# Patient Record
Sex: Female | Born: 1950
Health system: Southern US, Community
[De-identification: ages and names within clinical notes are randomized; demographics above are authoritative.]

## PROBLEM LIST (undated history)

## (undated) DIAGNOSIS — Z720 Tobacco use: Secondary | ICD-10-CM

## (undated) DIAGNOSIS — G8929 Other chronic pain: Secondary | ICD-10-CM

## (undated) DIAGNOSIS — R51 Headache: Secondary | ICD-10-CM

## (undated) DIAGNOSIS — E7800 Pure hypercholesterolemia, unspecified: Secondary | ICD-10-CM

## (undated) DIAGNOSIS — M1990 Unspecified osteoarthritis, unspecified site: Secondary | ICD-10-CM

## (undated) DIAGNOSIS — I10 Essential (primary) hypertension: Secondary | ICD-10-CM

## (undated) DIAGNOSIS — R0601 Orthopnea: Secondary | ICD-10-CM

## (undated) DIAGNOSIS — I2510 Atherosclerotic heart disease of native coronary artery without angina pectoris: Secondary | ICD-10-CM

## (undated) DIAGNOSIS — D649 Anemia, unspecified: Secondary | ICD-10-CM

## (undated) DIAGNOSIS — E119 Type 2 diabetes mellitus without complications: Secondary | ICD-10-CM

## (undated) DIAGNOSIS — I447 Left bundle-branch block, unspecified: Secondary | ICD-10-CM

## (undated) DIAGNOSIS — Z91199 Patient's noncompliance with other medical treatment and regimen due to unspecified reason: Secondary | ICD-10-CM

## (undated) DIAGNOSIS — J189 Pneumonia, unspecified organism: Secondary | ICD-10-CM

## (undated) DIAGNOSIS — J42 Unspecified chronic bronchitis: Secondary | ICD-10-CM

## (undated) DIAGNOSIS — M545 Low back pain, unspecified: Secondary | ICD-10-CM

## (undated) DIAGNOSIS — I5042 Chronic combined systolic (congestive) and diastolic (congestive) heart failure: Secondary | ICD-10-CM

## (undated) DIAGNOSIS — Z9119 Patient's noncompliance with other medical treatment and regimen: Secondary | ICD-10-CM

## (undated) HISTORY — PX: JOINT REPLACEMENT: SHX530

---

## 1998-07-13 ENCOUNTER — Emergency Department (HOSPITAL_COMMUNITY): Admission: EM | Admit: 1998-07-13 | Discharge: 1998-07-13 | Payer: Self-pay | Admitting: Emergency Medicine

## 1999-02-27 ENCOUNTER — Emergency Department (HOSPITAL_COMMUNITY): Admission: EM | Admit: 1999-02-27 | Discharge: 1999-02-27 | Payer: Self-pay | Admitting: Emergency Medicine

## 1999-02-28 ENCOUNTER — Emergency Department (HOSPITAL_COMMUNITY): Admission: EM | Admit: 1999-02-28 | Discharge: 1999-02-28 | Payer: Self-pay | Admitting: Emergency Medicine

## 1999-02-28 ENCOUNTER — Encounter: Payer: Self-pay | Admitting: Emergency Medicine

## 1999-05-16 ENCOUNTER — Emergency Department (HOSPITAL_COMMUNITY): Admission: EM | Admit: 1999-05-16 | Discharge: 1999-05-16 | Payer: Self-pay | Admitting: *Deleted

## 1999-05-16 ENCOUNTER — Encounter: Payer: Self-pay | Admitting: *Deleted

## 1999-06-08 ENCOUNTER — Encounter: Payer: Self-pay | Admitting: Emergency Medicine

## 1999-06-08 ENCOUNTER — Emergency Department (HOSPITAL_COMMUNITY): Admission: EM | Admit: 1999-06-08 | Discharge: 1999-06-08 | Payer: Self-pay | Admitting: Emergency Medicine

## 1999-08-05 ENCOUNTER — Encounter: Payer: Self-pay | Admitting: Emergency Medicine

## 1999-08-05 ENCOUNTER — Emergency Department (HOSPITAL_COMMUNITY): Admission: EM | Admit: 1999-08-05 | Discharge: 1999-08-05 | Payer: Self-pay | Admitting: Emergency Medicine

## 1999-10-23 ENCOUNTER — Emergency Department (HOSPITAL_COMMUNITY): Admission: EM | Admit: 1999-10-23 | Discharge: 1999-10-23 | Payer: Self-pay | Admitting: Emergency Medicine

## 2000-01-08 ENCOUNTER — Encounter: Admission: RE | Admit: 2000-01-08 | Discharge: 2000-01-08 | Payer: Self-pay | Admitting: Internal Medicine

## 2000-01-09 ENCOUNTER — Encounter: Payer: Self-pay | Admitting: *Deleted

## 2000-01-09 ENCOUNTER — Ambulatory Visit (HOSPITAL_COMMUNITY): Admission: RE | Admit: 2000-01-09 | Discharge: 2000-01-09 | Payer: Self-pay | Admitting: *Deleted

## 2000-02-18 ENCOUNTER — Encounter: Admission: RE | Admit: 2000-02-18 | Discharge: 2000-02-18 | Payer: Self-pay | Admitting: Obstetrics & Gynecology

## 2000-02-19 ENCOUNTER — Encounter: Admission: RE | Admit: 2000-02-19 | Discharge: 2000-02-19 | Payer: Self-pay

## 2000-03-10 ENCOUNTER — Ambulatory Visit (HOSPITAL_COMMUNITY): Admission: RE | Admit: 2000-03-10 | Discharge: 2000-03-10 | Payer: Self-pay

## 2001-01-10 ENCOUNTER — Encounter: Payer: Self-pay | Admitting: Emergency Medicine

## 2001-01-10 ENCOUNTER — Emergency Department (HOSPITAL_COMMUNITY): Admission: EM | Admit: 2001-01-10 | Discharge: 2001-01-10 | Payer: Self-pay | Admitting: Emergency Medicine

## 2002-01-04 ENCOUNTER — Emergency Department (HOSPITAL_COMMUNITY): Admission: EM | Admit: 2002-01-04 | Discharge: 2002-01-04 | Payer: Self-pay | Admitting: Emergency Medicine

## 2002-01-04 ENCOUNTER — Encounter: Payer: Self-pay | Admitting: Emergency Medicine

## 2002-01-07 ENCOUNTER — Inpatient Hospital Stay (HOSPITAL_COMMUNITY): Admission: AD | Admit: 2002-01-07 | Discharge: 2002-01-07 | Payer: Self-pay | Admitting: *Deleted

## 2002-03-30 ENCOUNTER — Emergency Department (HOSPITAL_COMMUNITY): Admission: EM | Admit: 2002-03-30 | Discharge: 2002-03-30 | Payer: Self-pay | Admitting: Emergency Medicine

## 2002-03-30 ENCOUNTER — Encounter: Payer: Self-pay | Admitting: Emergency Medicine

## 2002-05-22 ENCOUNTER — Encounter: Payer: Self-pay | Admitting: Emergency Medicine

## 2002-05-22 ENCOUNTER — Emergency Department (HOSPITAL_COMMUNITY): Admission: EM | Admit: 2002-05-22 | Discharge: 2002-05-22 | Payer: Self-pay | Admitting: Emergency Medicine

## 2003-03-30 ENCOUNTER — Inpatient Hospital Stay (HOSPITAL_COMMUNITY): Admission: AD | Admit: 2003-03-30 | Discharge: 2003-03-30 | Payer: Self-pay | Admitting: Obstetrics & Gynecology

## 2003-04-25 ENCOUNTER — Encounter: Admission: RE | Admit: 2003-04-25 | Discharge: 2003-04-25 | Payer: Self-pay | Admitting: Obstetrics and Gynecology

## 2003-06-13 ENCOUNTER — Emergency Department (HOSPITAL_COMMUNITY): Admission: EM | Admit: 2003-06-13 | Discharge: 2003-06-13 | Payer: Self-pay | Admitting: Emergency Medicine

## 2003-06-14 ENCOUNTER — Emergency Department (HOSPITAL_COMMUNITY): Admission: EM | Admit: 2003-06-14 | Discharge: 2003-06-14 | Payer: Self-pay | Admitting: Emergency Medicine

## 2003-06-28 ENCOUNTER — Encounter: Admission: RE | Admit: 2003-06-28 | Discharge: 2003-06-28 | Payer: Self-pay | Admitting: Internal Medicine

## 2003-07-10 ENCOUNTER — Ambulatory Visit (HOSPITAL_COMMUNITY): Admission: RE | Admit: 2003-07-10 | Discharge: 2003-07-10 | Payer: Self-pay | Admitting: Internal Medicine

## 2003-07-17 ENCOUNTER — Encounter: Admission: RE | Admit: 2003-07-17 | Discharge: 2003-07-17 | Payer: Self-pay | Admitting: Internal Medicine

## 2004-01-19 ENCOUNTER — Emergency Department (HOSPITAL_COMMUNITY): Admission: EM | Admit: 2004-01-19 | Discharge: 2004-01-19 | Payer: Self-pay | Admitting: Emergency Medicine

## 2004-02-29 ENCOUNTER — Ambulatory Visit: Payer: Self-pay | Admitting: Internal Medicine

## 2004-03-01 ENCOUNTER — Ambulatory Visit: Payer: Self-pay | Admitting: *Deleted

## 2004-03-06 ENCOUNTER — Emergency Department (HOSPITAL_COMMUNITY): Admission: EM | Admit: 2004-03-06 | Discharge: 2004-03-06 | Payer: Self-pay | Admitting: Emergency Medicine

## 2004-05-18 ENCOUNTER — Emergency Department (HOSPITAL_COMMUNITY): Admission: EM | Admit: 2004-05-18 | Discharge: 2004-05-18 | Payer: Self-pay | Admitting: Emergency Medicine

## 2004-08-17 ENCOUNTER — Emergency Department (HOSPITAL_COMMUNITY): Admission: EM | Admit: 2004-08-17 | Discharge: 2004-08-17 | Payer: Self-pay | Admitting: Emergency Medicine

## 2004-08-18 ENCOUNTER — Emergency Department (HOSPITAL_COMMUNITY): Admission: EM | Admit: 2004-08-18 | Discharge: 2004-08-18 | Payer: Self-pay | Admitting: *Deleted

## 2004-10-16 ENCOUNTER — Ambulatory Visit: Payer: Self-pay | Admitting: Internal Medicine

## 2004-11-15 ENCOUNTER — Ambulatory Visit: Payer: Self-pay | Admitting: Internal Medicine

## 2004-11-25 ENCOUNTER — Ambulatory Visit (HOSPITAL_COMMUNITY): Admission: RE | Admit: 2004-11-25 | Discharge: 2004-11-25 | Payer: Self-pay | Admitting: Internal Medicine

## 2004-12-10 ENCOUNTER — Ambulatory Visit: Payer: Self-pay | Admitting: Internal Medicine

## 2004-12-12 ENCOUNTER — Emergency Department (HOSPITAL_COMMUNITY): Admission: EM | Admit: 2004-12-12 | Discharge: 2004-12-12 | Payer: Self-pay | Admitting: Emergency Medicine

## 2004-12-14 ENCOUNTER — Ambulatory Visit (HOSPITAL_COMMUNITY): Admission: RE | Admit: 2004-12-14 | Discharge: 2004-12-14 | Payer: Self-pay | Admitting: Internal Medicine

## 2004-12-30 ENCOUNTER — Ambulatory Visit: Payer: Self-pay | Admitting: Internal Medicine

## 2005-01-23 ENCOUNTER — Encounter: Payer: Self-pay | Admitting: Cardiovascular Disease

## 2005-01-29 ENCOUNTER — Ambulatory Visit: Payer: Self-pay | Admitting: Cardiology

## 2005-01-29 ENCOUNTER — Inpatient Hospital Stay (HOSPITAL_COMMUNITY): Admission: RE | Admit: 2005-01-29 | Discharge: 2005-02-03 | Payer: Self-pay | Admitting: Orthopedic Surgery

## 2005-01-30 ENCOUNTER — Encounter: Payer: Self-pay | Admitting: Cardiology

## 2005-05-23 ENCOUNTER — Encounter: Payer: Self-pay | Admitting: Cardiovascular Disease

## 2005-08-15 ENCOUNTER — Ambulatory Visit: Payer: Self-pay | Admitting: Internal Medicine

## 2005-08-18 ENCOUNTER — Inpatient Hospital Stay (HOSPITAL_COMMUNITY): Admission: RE | Admit: 2005-08-18 | Discharge: 2005-08-21 | Payer: Self-pay | Admitting: Orthopedic Surgery

## 2005-11-24 ENCOUNTER — Ambulatory Visit: Payer: Self-pay | Admitting: Internal Medicine

## 2006-02-10 ENCOUNTER — Ambulatory Visit: Payer: Self-pay | Admitting: Internal Medicine

## 2006-06-05 ENCOUNTER — Ambulatory Visit: Payer: Self-pay | Admitting: Family Medicine

## 2006-09-18 ENCOUNTER — Ambulatory Visit: Payer: Self-pay | Admitting: Family Medicine

## 2006-10-11 ENCOUNTER — Emergency Department (HOSPITAL_COMMUNITY): Admission: EM | Admit: 2006-10-11 | Discharge: 2006-10-11 | Payer: Self-pay | Admitting: Emergency Medicine

## 2006-12-19 ENCOUNTER — Encounter: Admission: RE | Admit: 2006-12-19 | Discharge: 2006-12-19 | Payer: Self-pay | Admitting: Orthopedic Surgery

## 2006-12-19 ENCOUNTER — Encounter: Payer: Self-pay | Admitting: Cardiovascular Disease

## 2007-01-07 ENCOUNTER — Encounter: Admission: RE | Admit: 2007-01-07 | Discharge: 2007-02-16 | Payer: Self-pay | Admitting: Orthopedic Surgery

## 2007-03-25 ENCOUNTER — Emergency Department (HOSPITAL_COMMUNITY): Admission: EM | Admit: 2007-03-25 | Discharge: 2007-03-25 | Payer: Self-pay | Admitting: Emergency Medicine

## 2007-04-12 ENCOUNTER — Inpatient Hospital Stay (HOSPITAL_COMMUNITY): Admission: EM | Admit: 2007-04-12 | Discharge: 2007-04-20 | Payer: Self-pay | Admitting: Emergency Medicine

## 2007-04-12 ENCOUNTER — Ambulatory Visit: Payer: Self-pay | Admitting: Cardiology

## 2007-04-13 ENCOUNTER — Encounter (INDEPENDENT_AMBULATORY_CARE_PROVIDER_SITE_OTHER): Payer: Self-pay | Admitting: *Deleted

## 2007-05-24 ENCOUNTER — Emergency Department (HOSPITAL_COMMUNITY): Admission: EM | Admit: 2007-05-24 | Discharge: 2007-05-24 | Payer: Self-pay | Admitting: Emergency Medicine

## 2007-06-23 ENCOUNTER — Ambulatory Visit (HOSPITAL_BASED_OUTPATIENT_CLINIC_OR_DEPARTMENT_OTHER): Admission: RE | Admit: 2007-06-23 | Discharge: 2007-06-23 | Payer: Self-pay | Admitting: Internal Medicine

## 2007-06-26 ENCOUNTER — Ambulatory Visit: Payer: Self-pay | Admitting: Internal Medicine

## 2007-08-26 ENCOUNTER — Encounter: Payer: Self-pay | Admitting: Cardiovascular Disease

## 2007-09-13 ENCOUNTER — Inpatient Hospital Stay (HOSPITAL_COMMUNITY): Admission: RE | Admit: 2007-09-13 | Discharge: 2007-09-16 | Payer: Self-pay | Admitting: Orthopedic Surgery

## 2007-11-19 ENCOUNTER — Encounter: Payer: Self-pay | Admitting: Cardiovascular Disease

## 2007-12-13 ENCOUNTER — Emergency Department (HOSPITAL_COMMUNITY): Admission: EM | Admit: 2007-12-13 | Discharge: 2007-12-13 | Payer: Self-pay | Admitting: Emergency Medicine

## 2007-12-21 ENCOUNTER — Encounter: Payer: Self-pay | Admitting: Cardiovascular Disease

## 2008-01-03 ENCOUNTER — Emergency Department (HOSPITAL_COMMUNITY): Admission: EM | Admit: 2008-01-03 | Discharge: 2008-01-03 | Payer: Self-pay | Admitting: Emergency Medicine

## 2008-04-08 ENCOUNTER — Emergency Department (HOSPITAL_COMMUNITY): Admission: EM | Admit: 2008-04-08 | Discharge: 2008-04-09 | Payer: Self-pay | Admitting: Emergency Medicine

## 2009-02-01 DIAGNOSIS — R079 Chest pain, unspecified: Secondary | ICD-10-CM | POA: Insufficient documentation

## 2009-02-01 DIAGNOSIS — G4730 Sleep apnea, unspecified: Secondary | ICD-10-CM | POA: Insufficient documentation

## 2009-02-01 DIAGNOSIS — J209 Acute bronchitis, unspecified: Secondary | ICD-10-CM | POA: Insufficient documentation

## 2009-02-02 ENCOUNTER — Telehealth (INDEPENDENT_AMBULATORY_CARE_PROVIDER_SITE_OTHER): Payer: Self-pay | Admitting: *Deleted

## 2009-03-15 ENCOUNTER — Encounter: Admission: RE | Admit: 2009-03-15 | Discharge: 2009-03-15 | Payer: Self-pay | Admitting: Internal Medicine

## 2009-04-26 ENCOUNTER — Encounter (INDEPENDENT_AMBULATORY_CARE_PROVIDER_SITE_OTHER): Payer: Self-pay | Admitting: *Deleted

## 2009-07-22 ENCOUNTER — Emergency Department (HOSPITAL_COMMUNITY): Admission: EM | Admit: 2009-07-22 | Discharge: 2009-07-22 | Payer: Self-pay | Admitting: Emergency Medicine

## 2009-08-04 ENCOUNTER — Ambulatory Visit (HOSPITAL_COMMUNITY): Admission: RE | Admit: 2009-08-04 | Discharge: 2009-08-04 | Payer: Self-pay | Admitting: Orthopedic Surgery

## 2009-12-06 ENCOUNTER — Emergency Department (HOSPITAL_COMMUNITY): Admission: EM | Admit: 2009-12-06 | Discharge: 2009-12-06 | Payer: Self-pay | Admitting: Emergency Medicine

## 2010-01-16 ENCOUNTER — Observation Stay (HOSPITAL_COMMUNITY)
Admission: EM | Admit: 2010-01-16 | Discharge: 2010-01-17 | Payer: Self-pay | Source: Home / Self Care | Attending: Internal Medicine | Admitting: Internal Medicine

## 2010-02-16 ENCOUNTER — Encounter: Payer: Self-pay | Admitting: Internal Medicine

## 2010-02-17 ENCOUNTER — Encounter: Payer: Self-pay | Admitting: Orthopedic Surgery

## 2010-02-17 ENCOUNTER — Encounter: Payer: Self-pay | Admitting: Internal Medicine

## 2010-02-17 ENCOUNTER — Encounter: Payer: Self-pay | Admitting: Obstetrics and Gynecology

## 2010-02-21 ENCOUNTER — Other Ambulatory Visit (HOSPITAL_COMMUNITY): Payer: Self-pay | Admitting: Internal Medicine

## 2010-02-21 DIAGNOSIS — Z139 Encounter for screening, unspecified: Secondary | ICD-10-CM

## 2010-02-21 DIAGNOSIS — Z1231 Encounter for screening mammogram for malignant neoplasm of breast: Secondary | ICD-10-CM

## 2010-02-28 NOTE — Consult Note (Signed)
Summary: [facility]  [facility]   Imported By: Marylou Mccoy 02/23/2009 15:20:26  _____________________________________________________________________  External Attachment:    Type:   Image     Comment:   External Document

## 2010-02-28 NOTE — Consult Note (Signed)
Summary: [facility]   [facility]   Imported By: Marylou Mccoy 02/23/2009 15:35:34  _____________________________________________________________________  External Attachment:    Type:   Image     Comment:   External Document

## 2010-02-28 NOTE — Letter (Signed)
Summary: Appointment - Missed  [facility], Main Office  [address]   [place], [place] [address]   Phone: [phone]  Fax: [phone]     April 26, 2009 MRN: [id number]   [facility] [address] [place], [place]  [address]   Dear Ms. Kush,  Our records indicate you missed your appointment on        04/23/09                with Dr.       .       Riley Kill                             It is very important that we reach you to reschedule this appointment. We look forward to participating in your health care needs. Please contact us at the number listed above at your earliest convenience to reschedule this appointment.     Sincerely,    Glass blower/designer

## 2010-02-28 NOTE — Progress Notes (Signed)
  Faxed ROI, 12/14/,12/20,1/6,over to [facility].Recieved Records today, forwarded to Debra/Nishan for her to Flag. Cala Bradford Mesiemore  February 02, 2009 3:32 PM    Appended Document:  will not be sending to [facility] for her to flag, patient will be new here with Dr.Nishan on 02/26/09 will forward to [place].Marland KitchenMarland KitchenMarland Kitchen

## 2010-02-28 NOTE — Consult Note (Signed)
Summary: [facility]  [facility]   Imported By: Marylou Mccoy 02/23/2009 14:03:50  _____________________________________________________________________  External Attachment:    Type:   Image     Comment:   External Document

## 2010-02-28 NOTE — Consult Note (Signed)
Summary: [facility]  [facility]   Imported By: Marylou Mccoy 02/23/2009 15:39:10  _____________________________________________________________________  External Attachment:    Type:   Image     Comment:   External Document

## 2010-03-18 ENCOUNTER — Ambulatory Visit (HOSPITAL_COMMUNITY): Admission: RE | Admit: 2010-03-18 | Payer: Medicare Other | Source: Ambulatory Visit

## 2010-03-25 ENCOUNTER — Ambulatory Visit (HOSPITAL_COMMUNITY)
Admission: RE | Admit: 2010-03-25 | Discharge: 2010-03-25 | Disposition: A | Discharge status: Home / Self Care | Payer: PRIVATE HEALTH INSURANCE | Source: Ambulatory Visit | Attending: Internal Medicine | Admitting: Internal Medicine

## 2010-03-25 DIAGNOSIS — Z1231 Encounter for screening mammogram for malignant neoplasm of breast: Secondary | ICD-10-CM

## 2010-04-08 LAB — GLUCOSE, CAPILLARY
Glucose-Capillary: 231 mg/dL — ABNORMAL HIGH (ref 70–99)
Glucose-Capillary: 283 mg/dL — ABNORMAL HIGH (ref 70–99)
Glucose-Capillary: 291 mg/dL — ABNORMAL HIGH (ref 70–99)
Glucose-Capillary: 330 mg/dL — ABNORMAL HIGH (ref 70–99)
Glucose-Capillary: 331 mg/dL — ABNORMAL HIGH (ref 70–99)

## 2010-04-08 LAB — POCT CARDIAC MARKERS
CKMB, poc: 1 ng/mL (ref 1.0–8.0)
CKMB, poc: 1.1 ng/mL (ref 1.0–8.0)
CKMB, poc: <1 ng/mL — ABNORMAL LOW (ref 1.0–8.0)
Myoglobin, poc: 81.6 ng/mL (ref 12–200)
Myoglobin, poc: 86 ng/mL (ref 12–200)
Myoglobin, poc: 88.3 ng/mL (ref 12–200)
Troponin i, poc: <0.05 ng/mL (ref 0.00–0.09)
Troponin i, poc: <0.05 ng/mL (ref 0.00–0.09)
Troponin i, poc: <0.05 ng/mL (ref 0.00–0.09)

## 2010-04-08 LAB — COMPREHENSIVE METABOLIC PANEL
ALT: 10 U/L (ref 0–35)
AST: 15 U/L (ref 0–37)
Albumin: 3.3 g/dL — ABNORMAL LOW (ref 3.5–5.2)
Alkaline Phosphatase: 81 U/L (ref 39–117)
BUN: 12 mg/dL (ref 6–23)
CO2: 27 mEq/L (ref 19–32)
Calcium: 9 mg/dL (ref 8.4–10.5)
Chloride: 102 mEq/L (ref 96–112)
Creatinine, Ser: 0.73 mg/dL (ref 0.4–1.2)
GFR calc Af Amer: >60 mL/min (ref >60)
GFR calc non Af Amer: >60 mL/min (ref >60)
Glucose, Bld: 251 mg/dL — ABNORMAL HIGH (ref 70–99)
Potassium: 3.4 mEq/L — ABNORMAL LOW (ref 3.5–5.1)
Sodium: 139 mEq/L (ref 135–145)
Total Bilirubin: 0.6 mg/dL (ref 0.3–1.2)
Total Protein: 6.3 g/dL (ref 6.0–8.3)

## 2010-04-08 LAB — CBC
HCT: 36.2 % (ref 36.0–46.0)
HCT: 40.7 % (ref 36.0–46.0)
Hemoglobin: 12.8 g/dL (ref 12.0–15.0)
Hemoglobin: 14.7 g/dL (ref 12.0–15.0)
MCH: 31.4 pg (ref 26.0–34.0)
MCH: 31.8 pg (ref 26.0–34.0)
MCHC: 35.4 g/dL (ref 30.0–36.0)
MCHC: 36.1 g/dL — ABNORMAL HIGH (ref 30.0–36.0)
MCV: 88.1 fL (ref 78.0–100.0)
MCV: 88.9 fL (ref 78.0–100.0)
Platelets: 311 10*3/uL (ref 150–400)
Platelets: 433 10*3/uL — ABNORMAL HIGH (ref 150–400)
RBC: 4.07 MIL/uL (ref 3.87–5.11)
RBC: 4.62 MIL/uL (ref 3.87–5.11)
RDW: 12.2 % (ref 11.5–15.5)
RDW: 12.2 % (ref 11.5–15.5)
WBC: 10.6 10*3/uL — ABNORMAL HIGH (ref 4.0–10.5)
WBC: 9.8 10*3/uL (ref 4.0–10.5)

## 2010-04-08 LAB — LIPID PANEL
Cholesterol: 164 mg/dL (ref 0–200)
HDL: 35 mg/dL — ABNORMAL LOW (ref >39)
LDL Cholesterol: 104 mg/dL — ABNORMAL HIGH (ref 0–99)
Total CHOL/HDL Ratio: 4.7 RATIO
Triglycerides: 125 mg/dL (ref <150)
VLDL: 25 mg/dL (ref 0–40)

## 2010-04-08 LAB — DIFFERENTIAL
Basophils Absolute: 0 10*3/uL (ref 0.0–0.1)
Basophils Relative: 0 % (ref 0–1)
Eosinophils Absolute: 0.1 10*3/uL (ref 0.0–0.7)
Eosinophils Relative: 1 % (ref 0–5)
Lymphocytes Relative: 33 % (ref 12–46)
Lymphs Abs: 3.5 10*3/uL (ref 0.7–4.0)
Monocytes Absolute: 0.6 10*3/uL (ref 0.1–1.0)
Monocytes Relative: 6 % (ref 3–12)
Neutro Abs: 6.3 10*3/uL (ref 1.7–7.7)
Neutrophils Relative %: 60 % (ref 43–77)

## 2010-04-08 LAB — BASIC METABOLIC PANEL
BUN: 15 mg/dL (ref 6–23)
CO2: 27 mEq/L (ref 19–32)
Calcium: 9.7 mg/dL (ref 8.4–10.5)
Chloride: 103 mEq/L (ref 96–112)
Creatinine, Ser: 0.92 mg/dL (ref 0.4–1.2)
GFR calc Af Amer: >60 mL/min (ref >60)
GFR calc non Af Amer: >60 mL/min (ref >60)
Glucose, Bld: 318 mg/dL — ABNORMAL HIGH (ref 70–99)
Potassium: 3.7 mEq/L (ref 3.5–5.1)
Sodium: 137 mEq/L (ref 135–145)

## 2010-04-08 LAB — HEPATIC FUNCTION PANEL
ALT: 13 U/L (ref 0–35)
AST: 18 U/L (ref 0–37)
Albumin: 3.8 g/dL (ref 3.5–5.2)
Alkaline Phosphatase: 96 U/L (ref 39–117)
Bilirubin, Direct: <0.1 mg/dL (ref 0.0–0.3)
Total Bilirubin: 0.5 mg/dL (ref 0.3–1.2)
Total Protein: 7.3 g/dL (ref 6.0–8.3)

## 2010-04-08 LAB — MAGNESIUM: Magnesium: 1.8 mg/dL (ref 1.5–2.5)

## 2010-04-08 LAB — PROTIME-INR
INR: 0.93 (ref 0.00–1.49)
Prothrombin Time: 12.7 seconds (ref 11.6–15.2)

## 2010-04-08 LAB — CARDIAC PANEL(CRET KIN+CKTOT+MB+TROPI)
CK, MB: 1.5 ng/mL (ref 0.3–4.0)
Relative Index: INVALID (ref 0.0–2.5)
Total CK: 90 U/L (ref 7–177)
Troponin I: 0.02 ng/mL (ref 0.00–0.06)

## 2010-04-08 LAB — TSH: TSH: 1.332 u[IU]/mL (ref 0.350–4.500)

## 2010-04-08 LAB — HEMOGLOBIN A1C
Hgb A1c MFr Bld: 12.1 % — ABNORMAL HIGH (ref <5.7)
Mean Plasma Glucose: 301 mg/dL — ABNORMAL HIGH (ref <117)

## 2010-04-08 LAB — T4, FREE: Free T4: 1.21 ng/dL (ref 0.80–1.80)

## 2010-04-09 LAB — DIFFERENTIAL
Basophils Absolute: 0 10*3/uL (ref 0.0–0.1)
Basophils Relative: 0 % (ref 0–1)
Eosinophils Absolute: 0.2 10*3/uL (ref 0.0–0.7)
Eosinophils Relative: 2 % (ref 0–5)
Lymphocytes Relative: 32 % (ref 12–46)
Lymphs Abs: 3.4 10*3/uL (ref 0.7–4.0)
Monocytes Absolute: 0.5 10*3/uL (ref 0.1–1.0)
Monocytes Relative: 5 % (ref 3–12)
Neutro Abs: 6.4 10*3/uL (ref 1.7–7.7)
Neutrophils Relative %: 61 % (ref 43–77)

## 2010-04-09 LAB — BASIC METABOLIC PANEL
BUN: 10 mg/dL (ref 6–23)
CO2: 24 mEq/L (ref 19–32)
Calcium: 9.5 mg/dL (ref 8.4–10.5)
Chloride: 103 mEq/L (ref 96–112)
Creatinine, Ser: 0.64 mg/dL (ref 0.4–1.2)
GFR calc Af Amer: >60 mL/min (ref >60)
GFR calc non Af Amer: >60 mL/min (ref >60)
Glucose, Bld: 195 mg/dL — ABNORMAL HIGH (ref 70–99)
Potassium: 3.7 mEq/L (ref 3.5–5.1)
Sodium: 139 mEq/L (ref 135–145)

## 2010-04-09 LAB — CBC
HCT: 39.4 % (ref 36.0–46.0)
Hemoglobin: 13.9 g/dL (ref 12.0–15.0)
MCH: 32.4 pg (ref 26.0–34.0)
MCHC: 35.3 g/dL (ref 30.0–36.0)
MCV: 91.8 fL (ref 78.0–100.0)
Platelets: 429 10*3/uL — ABNORMAL HIGH (ref 150–400)
RBC: 4.29 MIL/uL (ref 3.87–5.11)
RDW: 12.8 % (ref 11.5–15.5)
WBC: 10.5 10*3/uL (ref 4.0–10.5)

## 2010-04-09 LAB — TROPONIN I: Troponin I: 0.02 ng/mL (ref 0.00–0.06)

## 2010-04-14 LAB — URINALYSIS, ROUTINE W REFLEX MICROSCOPIC
Bilirubin Urine: NEGATIVE
Glucose, UA: >1000 mg/dL — AB
Ketones, ur: NEGATIVE mg/dL
Leukocytes, UA: NEGATIVE
Nitrite: NEGATIVE
Protein, ur: NEGATIVE mg/dL
Specific Gravity, Urine: 1.034 — ABNORMAL HIGH (ref 1.005–1.030)
Urobilinogen, UA: 0.2 mg/dL (ref 0.0–1.0)
pH: 6 (ref 5.0–8.0)

## 2010-04-14 LAB — URINE CULTURE: Colony Count: >=100000

## 2010-04-14 LAB — URINE MICROSCOPIC-ADD ON

## 2010-04-14 LAB — GLUCOSE, CAPILLARY: Glucose-Capillary: 365 mg/dL — ABNORMAL HIGH (ref 70–99)

## 2010-05-09 LAB — URINALYSIS, ROUTINE W REFLEX MICROSCOPIC
Bilirubin Urine: NEGATIVE
Glucose, UA: >1000 mg/dL — AB
Ketones, ur: NEGATIVE mg/dL
Leukocytes, UA: NEGATIVE
Nitrite: NEGATIVE
Protein, ur: NEGATIVE mg/dL
Specific Gravity, Urine: 1.037 — ABNORMAL HIGH (ref 1.005–1.030)
Urobilinogen, UA: 0.2 mg/dL (ref 0.0–1.0)
pH: 5.5 (ref 5.0–8.0)

## 2010-05-09 LAB — COMPREHENSIVE METABOLIC PANEL
ALT: 17 U/L (ref 0–35)
AST: 18 U/L (ref 0–37)
Albumin: 3.8 g/dL (ref 3.5–5.2)
Alkaline Phosphatase: 118 U/L — ABNORMAL HIGH (ref 39–117)
BUN: 7 mg/dL (ref 6–23)
CO2: 27 mEq/L (ref 19–32)
Calcium: 9.5 mg/dL (ref 8.4–10.5)
Chloride: 95 mEq/L — ABNORMAL LOW (ref 96–112)
Creatinine, Ser: 0.72 mg/dL (ref 0.4–1.2)
GFR calc Af Amer: >60 mL/min (ref >60)
GFR calc non Af Amer: >60 mL/min (ref >60)
Glucose, Bld: 609 mg/dL (ref 70–99)
Potassium: 4 mEq/L (ref 3.5–5.1)
Sodium: 130 mEq/L — ABNORMAL LOW (ref 135–145)
Total Bilirubin: 0.6 mg/dL (ref 0.3–1.2)
Total Protein: 7.2 g/dL (ref 6.0–8.3)

## 2010-05-09 LAB — CK TOTAL AND CKMB (NOT AT ARMC)
CK, MB: 2.8 ng/mL (ref 0.3–4.0)
Relative Index: 1.5 (ref 0.0–2.5)
Total CK: 187 U/L — ABNORMAL HIGH (ref 7–177)

## 2010-05-09 LAB — GLUCOSE, CAPILLARY
Glucose-Capillary: 144 mg/dL — ABNORMAL HIGH (ref 70–99)
Glucose-Capillary: 251 mg/dL — ABNORMAL HIGH (ref 70–99)
Glucose-Capillary: 277 mg/dL — ABNORMAL HIGH (ref 70–99)
Glucose-Capillary: 393 mg/dL — ABNORMAL HIGH (ref 70–99)

## 2010-05-09 LAB — DIFFERENTIAL
Basophils Absolute: 0.1 10*3/uL (ref 0.0–0.1)
Basophils Relative: 1 % (ref 0–1)
Eosinophils Absolute: 0.1 10*3/uL (ref 0.0–0.7)
Eosinophils Relative: 1 % (ref 0–5)
Lymphocytes Relative: 27 % (ref 12–46)
Lymphs Abs: 2.5 10*3/uL (ref 0.7–4.0)
Monocytes Absolute: 0.5 10*3/uL (ref 0.1–1.0)
Monocytes Relative: 6 % (ref 3–12)
Neutro Abs: 5.9 10*3/uL (ref 1.7–7.7)
Neutrophils Relative %: 65 % (ref 43–77)

## 2010-05-09 LAB — CBC
HCT: 40.3 % (ref 36.0–46.0)
Hemoglobin: 13.8 g/dL (ref 12.0–15.0)
MCHC: 34.4 g/dL (ref 30.0–36.0)
MCV: 92.1 fL (ref 78.0–100.0)
Platelets: 408 10*3/uL — ABNORMAL HIGH (ref 150–400)
RBC: 4.37 MIL/uL (ref 3.87–5.11)
RDW: 12.5 % (ref 11.5–15.5)
WBC: 9.1 10*3/uL (ref 4.0–10.5)

## 2010-05-09 LAB — BRAIN NATRIURETIC PEPTIDE: Pro B Natriuretic peptide (BNP): <30 pg/mL (ref 0.0–100.0)

## 2010-05-09 LAB — URINE MICROSCOPIC-ADD ON

## 2010-05-09 LAB — TROPONIN I: Troponin I: 0.02 ng/mL (ref 0.00–0.06)

## 2010-05-09 LAB — D-DIMER, QUANTITATIVE: D-Dimer, Quant: 0.79 ug/mL-FEU — ABNORMAL HIGH (ref 0.00–0.48)

## 2010-06-11 NOTE — Cardiovascular Report (Signed)
[name], [name]                ACCOUNT NO.:  [id number]   MEDICAL RECORD NO.:  [id number]          PATIENT TYPE:  INP   LOCATION:  3709                         FACILITY:  [facility]   PHYSICIAN:  Bevelyn Buckles. Bensimhon, MDDATE OF BIRTH:  [birth year]   DATE OF PROCEDURE:  04/19/2007  DATE OF DISCHARGE:                            CARDIAC CATHETERIZATION   IDENTIFICATION:  [name] is a 60 year old woman with a history of  severe hypertension, poorly controlled diabetes and ongoing tobacco use.  She was admitted with chest pain.  Myoview suggested an EF of 39% with  question of inferior ischemia.  She is thus referred for cardiac  catheterization.  There is also a history of possible obesity  hypoventilation syndrome and a right heart catheterization was performed  as well.   PROCEDURES PERFORMED:  1. Right heart cath.  2. Selective coronary angiography.  3. Left heart cath.  4. Left ventriculogram.  5. Abdominal aortogram to evaluate for renal artery stenosis in the      setting of severe hypertension.  6. StarClose femoral artery closure device.   DESCRIPTION OF PROCEDURE:  The risks and indication of the procedure  were explained.  Consent was signed and placed in the chart.  A 5-French  arterial sheath was placed in the right femoral artery.  Standard  catheters including JL-4, JR-4 and angled pigtail used to for  catheterization.  All catheter exchanges made over wire.  There were no  apparent complications.  A 7-French venous sheath was placed in right  femoral vein.  Standard right heart catheterization was performed using  a Swan-Ganz catheter.  Once again, no apparent complications.  At the  end the procedure, the right femoral arteriotomy site was closed with  the StarClose closure device.  There was good hemostasis.   FINDINGS:  Right atrial pressure mean of 8, RV pressure 34/7, PA  pressure 32/14 with a mean of 22.  Pulmonary capillary wedge pressure  mean of 12.  Central  aortic pressure 184/93 with a mean of 128.  LV  pressure 186/0 with an EDP of 17.  Fick cardiac outputs 5.7 liters per  minute.  Cardiac index 2.6 liters per minute per meter squared.  Pulmonary vascular resistance was 1.9 Woods units.   Left main was normal.   LAD was a long vessel wrapping the apex.  It gave off two diagonals.  There was a 20% lesion in the distal LAD.   Left circumflex gave off a small OM-1, a large OM-2 and two  posterolaterals, angiographically normal.   Right coronary artery is a moderate-sized dominant vessel, gave off an  RV branch, a large acute marginal, a small PDA and a posterolateral.  Angiographically normal.   Left ventriculogram done in the RAO position showed ejection fraction of  50-55%.   ASSESSMENT:  There are no regional wall motion abnormalities.   Abdominal aortogram showed a normal abdominal aorta with no evidence of  aneurysmal dilatation.  Renal arteries were normal bilaterally.   ASSESSMENT:  1. Minimal nonobstructive coronary artery disease described above.  2. Low normal LV function.  3. Normal renal arteries.  4. Severe hypertension.  5. Normal right-sided heart pressures.   PLAN:  I have given her the results of her catheterization.  Her chest  pain might possibly be due to an elevated LVEDP.  However, there does  not appear to be any evidence of ischemia.  Would recommend medical  therapy with focus on tight blood pressure and diabetes control.  She is  okay for discharge later today if her blood pressure is okay.      Bevelyn Buckles. Bensimhon, MD  Electronically Signed     DRB/MEDQ  D:  04/19/2007  T:  04/19/2007  Job:  [id number]

## 2010-06-11 NOTE — H&P (Signed)
NAMESHATERA, RENNERT                ACCOUNT NO.:  [id number]   MEDICAL RECORD NO.:  [id number]          PATIENT TYPE:  INP   LOCATION:  3709                         FACILITY:  [facility]   PHYSICIAN:  Lucita Ferrara, MD         DATE OF BIRTH:  26-Jun-1950   DATE OF ADMISSION:  04/12/2007  DATE OF DISCHARGE:                              HISTORY & PHYSICAL   CHIEF COMPLAINT:  Chest pain.   The patient is a 60 year old African American female who presents to [facility] with a chief complaint of thumping in the chest  that was sudden in nature while the patient was at rest.  That lasted  about 15 minutes, was accompanied by shortness of breath.  The patient  describes the symptoms as getting progressively worse.  The patient's  symptoms were worsening upon lying on the back.  There was some  associated cough, nonproductive, and there was some diaphoresis.  The  patient denies long-term orthopnea or paroxysmal nocturnal dyspnea.  Otherwise her review of systems is negative.  Denies any fevers, chills.  Denies nausea, vomiting, diarrhea, abdominal pain.  Denies myalgias or  musculoskeletal pain.  Pain is nonreproducible.  The patient states that  she has some vaginosis and itching in the vaginal area.   PAST MEDICAL HISTORY:  1. 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      This was in October 2007.  __________. Left hip arthroplasty and      repair x2.  2. Currently the patient is on metformin, presumed for diabetes type      2.  She follows up at the [facility].  3. The patient has also had a history of hypertension that is not well-      controlled.  4. EKG changes compatible with left ventricular hypertrophy in the      past.  5. 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      The patient was advised to follow up in regard to this with      cardiologist.  This  was, I do no not believe, ever done.   PAST SURGICAL HISTORY:  1. Tubal ligation.  2. Left hip arthroplasty in December 2006.  The patient also had a      right total hip arthroplasty in July 2007.  __________.   SOCIAL HISTORY:  The patient smokes about 9 cigarettes per day.  Denies  drugs or alcohol.  She is apparently disabled secondary to severe  osteoarthritis.   FAMILY HISTORY:  Mother died with a history of hypertension and  diabetes.  Father died with a history of ethanol at an unknown age.   MEDICATIONS AT HOME:  1. Metformin 500 mg b.i.d.  2. Darvocet-N 100 p.o. daily for joint pain.  3. Diltiazem, unknown dose and unknown schedule.   VITAL SIGNS ON ADMISSION:  The patient's blood pressure was 186/102,  then it came down to 166/87 upon institution of blood pressure  medication.  Pulse 88.  Respirations 21.  Pulse oximetry 97% on room  air.  GENERAL:  The patient is in no acute distress, a pleasant 60 year old  African American female, obese.  HEENT:  Normocephalic, atraumatic.  Sclerae are anicteric.  PERRLA.  Extraocular movements were intact.  NECK: Supple.  No JVD, no carotid bruits.  CARDIOVASCULAR:  S1, S2, regular rate and rhythm.  No murmurs, rubs,  clicks.  ABDOMEN:  Soft, nontender, not distended, positive bowel sounds.]  LUNGS:  Clear to auscultation bilaterally.  No rhonchi, rales or  wheezes.  There is no focal chest wall tenderness or reproducibility.  EXTREMITIES:  No clubbing, cyanosis, or edema.  NEUROLOGIC:  The patient is alert and oriented x3.  Cranial nerves II-  XII grossly intact.   LABORATORY DATA:  CBC:  White count 7.4, hemoglobin 13.5, hematocrit  39.3, platelets 395.  Urine drug screen negative.  Urinalysis shows rare  bacteria, negative leukocyte esterase, negative nitrite, trace blood,  greater than 1000 glucose.  Beta-natriuretic peptide less than 30.  ABG  shows pH of 7.382, a pCO2 of 49.9, paO2 of 65, bicarb 29.  Cholesterol  141,  LDL is 87.  Chest x-ray shows no acute findings, no cardiomegaly,  clear lungs.  EKG:  90 beats per minute, normal sinus rhythm, left axis  deviation, nonspecific ST-T wave changes.  There is obvious change since  October 11, 2006, inferolaterally.   ASSESSMENT AND PLAN:  This patient is a 60 year old African American  female with:   1. Chest pain and shortness of breath, most likely multifactorial in      etiology, likely secondary to questionable chronic obstructive      pulmonary disease given the mild increase in pCO2 level, although      her chest x-ray does not support this and her lungs sound clear,      yet she is a longstanding smoker, .  Rule out coronary artery      disease, acute coronary syndrome, ischemia given nonspecific EKG      changes and inferolateral changes and multiple risk factors.  We      will go ahead and admit the patient for monitoring on a telemetry      floor, serial enzymes x3 every 8 hours.  If negative, the patient      will need a thallium stress test versus a stress echo.  We will go      ahead and get a fasting lipid profile and TSH.  For completeness,      also I will do a D-dimer and, if positive, may consider doing a CT      angiogram.  2. Uncontrolled hypertension.  We will go ahead and proceed with a 2-D      echocardiogram to rule out left ventricular hypertrophy, assess the      ejection fraction.  The patient is already on diltiazem.  I      question the patient's compliance with medications.  I will go      ahead and start hydrochlorothiazide 25 mg p.o. daily, nitroglycerin      paste now to control elevated blood pressures, clonidine for      systolic blood pressure greater than 180, get a TSH.  3. Diabetes type 2 with increased blood sugar.  Again, she is on      metformin at home.  Again I question the compliance.  Go ahead and      get a hemoglobin A1c, start  sliding scale insulin and Lantus.  4. Hypercapnia that is mild and  hypoxemia.  Again, await until full      workup finished, D-dimer and if positive, CT angiogram, cardiac      thallium stress test.  5. Deep vein thrombosis prophylaxis with Lovenox and gastrointestinal      prophylaxis with Protonix.   I have explained the plans and procedures of this admission to the  patient.  The patient understands.      Lucita Ferrara, MD  Electronically Signed     RR/MEDQ  D:  04/13/2007  T:  04/13/2007  Job:  [id number]

## 2010-06-11 NOTE — Consult Note (Signed)
NAMECORAIMA, [name]                ACCOUNT NO.:  [id number]   MEDICAL RECORD NO.:  [id number]          PATIENT TYPE:  INP   LOCATION:  3709                         FACILITY:  [facility]   PHYSICIAN:  Arturo Morton. Riley Kill, MD, FACCDATE OF BIRTH:  1950/08/22   DATE OF CONSULTATION:  04/13/2007  DATE OF DISCHARGE:                                 CONSULTATION   CHIEF COMPLAINT:  Chest tightness.   HISTORY OF PRESENT ILLNESS:  [name] is a 60 year old, who has  presented to the emergency room.  She was having chest discomfort since  Thursday night.  She has had some discomfort for the past month or so.  It is not clearly exertion-related that sometimes does radiate into the  arms and into the neck.  When she has it, it is moderately severe.  She  has continued to smoke since the age of 89, has diabetes and  hypertension and for the past 2 weeks, she has also been out of her  medications.  She says that she was seen in January at [facility], but  she has had some trouble getting her prescriptions since that time.  The  discomfort is described is somewhat inframammary, but radiating into  both arms and up into the neck.  It is not definitely related to  exertion, but she says she gets so short of breath when she walks across  the room, that she can barely ago from place to place.  She had a prior  evaluation in January 2007. At that time, her LV function was preserved  by echocardiography, and a Cardiolite study was negative for ischemia,  but the LV was dilated and ejection fraction was 39%.   PAST MEDICAL HISTORY:  1. Hypertension for quite some time.  2. She has had type 2 diabetes since 2007 and in the hospital, her      sugars have been as high as 400.  3. She had bradycardia following hip surgery in 2007.  4. She had right hip treatment as well.   The patient lives in [place] alone in an apartment.  She is on  disability.  She does not utilize drugs and smokes about 6-7 cigarettes  a day.   FAMILY HISTORY:  Her father died in his 74s of alcohol.  Mother died at  61 of heart problems.  She had 1 brother and 2 sisters who died at  birth, 1 who died of murder/  She has 2 sisters with diabetes.   REVIEW OF SYSTEMS:  She has not had fever, sweats, or chills.  She has  had an increase in her weight.  She wears glasses.  She has had some  cough and wheezing associated with the shortness of breath.  She has  frequent nocturia and polyuria and dipsia.  She has had no GI bleeding  or major gastrointestinal complaints. A 10-point review of systems is  otherwise negative.   ALLERGIES:  Morphine Sulfate With Nausea And Vomiting.   MEDICATIONS:  1. Metformin 500 daily.  2/  Diltiazem, although she is not really sure of all of the  medicines.  In asking her, she says she does take insulin, but then she says it is  in a pill form.   ON EXAMINATION:  The patient is alert and oriented.  She provides a  clear-cut history.  Her O2 saturation is 96% on room air, temperature  98.1, pulse 73, respiratory rate 18, blood pressure 147/76 and equal in  both arms.  She has a smoker's cough in the room.  The jugular veins are not distended.  The neck is supple.  Extraocular muscles are intact.  Her pupils are equal and reactive to  light and accommodation.  She has a nonfocal cranial nerve exam.  The lung fields are clear to rales and percussion, but there is some  slight rhonchi noted both posteriorly and anteriorly.  The PMI is nondisplaced.  There is an S4 gallop.  The cardiac rhythm is  regular without a murmur or rub.  ABDOMEN:  Obese; distal pulses are intact.  No bruits are appreciated.   The electrocardiogram demonstrates normal sinus rhythm.  There is left  ventricular hypertrophy with repolarization abnormality, and delayed R-  wave progression which may just be from the lead placement and LVH.   Her pCO2 is 49, pO2 65, and pH is 7.38, LDL is 87.   IMPRESSION:  1. The  patient has chest pain with negative enzymes and multiple      cardiac risk factors there are typical and atypical features.  It      does not typically occur necessarily with exertion, although it is      pressure.  She thinks it may be related in part to her breathing.  2. Hypo obesity hypoventilation and/or chronic obstructive pulmonary      disease, question consider sleep apnea.  3. Hypertension.  4. Poorly controlled diabetes mellitus.  5. Continued smoking.  6. Question medical compliance.   RECOMMENDATIONS:  These recommendations have been reviewed with the  patient in detail.  1. A smoking cessation consult would be recommended with plans for      coordination of this with her primary care Maui Britten for a      comprehensive program as it is central to her long-term outcome.  2. Diabetes education in-house would be recommended.  Glucose control      with pharmacologic means is as implemented by [facility] would also      be recommended.  3. Blood pressure control to possibly include ACE inhibition and/or      angiotensin receptor blockade to improve long-term renal outcome.  4. A comprehensive coordination of numbers 1, 2, and 3.  Her long-term      outcome will be heavily dependent on success with 1, 2, and 3,      regardless of her cardiac evaluation.  5. Two-dimensional echocardiography to assess left ventricular      hypertrophy and also overall global left ventricular function.  6. Ischemia evaluation.  With negative cardiac enzymes and EKGs      similar to what she has had previously, an exercise tolerance test      with radionuclide imaging could be performed.  However, I would      optimize her glucose before this is undertaken.  Alternatively, if      she continues to have symptoms, she may require cardiac      catheterization, but she would like to avoid this and thinks she      can walk on the treadmill.  7. Pulmonary consultation will be  considered with regard to  obesity      hypoventilation, possible sleep apnea, and      questions as to whether any other therapy should be initiated given      the patient's elevated pCO2.  8. I would agree with aspirin, beta blockers at the present time, and      I would aim towards radionuclide imaging on Thursday.      Arturo Morton. Riley Kill, MD, [facility]  Electronically Signed     TDS/MEDQ  D:  04/13/2007  T:  04/13/2007  Job:  [id number]   cc:   Cleda Mccreedy, M.D.  [facility]  Luis Abed, MD, [facility]

## 2010-06-11 NOTE — Procedures (Signed)
NAMEALEXINA, [name]                ACCOUNT NO.:  [id number]   MEDICAL RECORD NO.:  [id number]          PATIENT TYPE:  OUT   LOCATION:  SLEEP CENTER                 FACILITY:  [facility]   PHYSICIAN:  Clinton D. Maple Hudson, MD, FCCP, FACPDATE OF BIRTH:  20-[birth year]1952   DATE OF STUDY:  06/23/2007                            NOCTURNAL POLYSOMNOGRAM   REFERRING PHYSICIAN:  Lonia Blood, M.D.   INDICATION FOR STUDY:  Hypersomnia with sleep apnea.   EPWORTH SLEEPINESS SCORE:  11/24, BMI 41.3, weight 264 pounds, height 67  inches, neck 14 inches.   HOME MEDICATIONS:  Charted and reviewed.   SLEEP ARCHITECTURE:  Total sleep time 276 minutes with sleep efficiency  74.7%.  Stage 1 was 98.%, stage 2 was 64%, stage 3 absent, REM 25.9% of  total sleep time.  Sleep latency 32 minutes, REM latency 71 minutes,  awake after sleep onset 62 minutes, arousal index 13.  Simvastatin was  taken at 10 p.m.   RESPIRATORY DATA:  Apnea/hypopnea index (AHI) 5.7 per hour indicating  minimal obstructive sleep apnea/hypopnea syndrome.  Twenty-six events  were scored including 1 central apnea and 25 hypopneas.  Respiratory  disturbance index (RDI) was 6.3.  There were insufficient events to  permit CPAP titration by split protocol on this study night.   OXYGEN DATA:  Moderate snoring with oxygen desaturation to a nadir of  87%.  Mean oxygen saturation through the study was 94% on room air.   CARDIAC DATA:  Normal sinus rhythm.   MOVEMENT/PARASOMNIA:  Rare limb jerk associated with arousal.  One  bathroom trip.   IMPRESSION/RECOMMENDATION:  1. Mild obstructive sleep apnea/hypopnea syndrome, apnea/hypopnea      index 5.7, respiratory disturbance index 6.3 per hour.  These      indicate very mild obstructive sleep apnea/hypopnea syndrome,      mainly hypopneas associated with non-supine sleep position,      moderate snoring and oxygen desaturation to a nadir of 78%.  2. There were insufficient events to qualify for  continuous positive      airway pressure titration by split protocol on this study night.      Scores in this range are not usually addressed with continuous      positive airway pressure.      Weight loss and treatment for a significant nasal or upper airway      obstruction might be helpful if clinically indicated.      Clinton D. Maple Hudson, MD, [facility], FACP  Diplomate, Biomedical engineer of Sleep Medicine  Electronically Signed     CDY/MEDQ  D:  06/26/2007 13:56:27  T:  06/26/2007 14:19:25  Job:  [id number]

## 2010-06-11 NOTE — Discharge Summary (Signed)
[name], [name]                ACCOUNT NO.:  [id number]   MEDICAL RECORD NO.:  [id number]          PATIENT TYPE:  INP   LOCATION:  3709                         FACILITY:  [facility]   PHYSICIAN:  Lonia Blood, M.D.       DATE OF BIRTH:  07-21-1950   DATE OF ADMISSION:  04/12/2007  DATE OF DISCHARGE:  04/19/2007                               DISCHARGE SUMMARY   DISCHARGE DIAGNOSES:  1. Chest pain - cardiac catheterization without any significant      coronary artery disease.  2. Uncontrolled diabetes mellitus type 2.  3. Hypertension stage 3.  4. Obstructive sleep apnea - sleep study pending.  5. Tobacco abuse.  6. Incidental finding of a thickened terminal ileum on a CT scan.  7. 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  8. Status post right hip arthroplasty in July 2007.   DISCHARGE MEDICATIONS:  1. Nicotine patch 21 mg daily.  2. Amaryl 2 mg each morning.  3. Metformin 500 mg twice a day to start on April 22, 2007.  4. Lantus 40 units at bedtime.  5. Aspirin 81 mg daily.  6. Simvastatin 40 mg at bedtime.  7. Metoprolol extended release 150 mg each morning.  8. Dyazide 25/37.5 mg daily.   CONDITION ON DISCHARGE:  [name] is discharged in good condition.  At  the time of the discharge, the patient is instructed to follow up with  her new primary care physician, Dr. Lonia Blood.  The visit was  scheduled for April 26, 2007, at 3 p.m.Marland Kitchen  Also, the patient will have a  sleep study scheduled prior to discharge.   CONSULTATIONS DURING THIS ADMISSION:  The patient was seen in  consultation by [facility].   PROCEDURES DURING THIS ADMISSION:  1. On April 13, 2007, the patient underwent a transthoracic      echocardiogram.  Findings of ejection fraction of 55 to 65%,      increased left ventricular thickness.  Doppler parameters      consistent with high left ventricular filling pressure, left atrial      size at the upper limits of normal.  2. On April 16, 2007, CT scan of abdomen and pelvis with intravenous      and oral contrast.  Findings of thickening of the terminal ileum,      right renal cysts, moderate stool in the rectosigmoid colon.  3. On April 17, 2007, myocardial imaging with SPECT.  Findings of      concern for a small area of reversibility along the inferior wall,      mild hypokinesia, stable ejection fraction of 40%.  4. On April 19, 2007, cardiac catheterization with left heart      catheterization, right heart catheterization, coronary angiography,      abdominal aortogram, and left ventriculogram.  Findings of ejection      fraction of 50-55%, minimal nonobstructive coronary artery disease,      and normal right side heart pressures.   HISTORY AND PHYSICAL:  For admission history and physical, refer to the  dictated  H&P which was done on April 13, 2007, by Dr. Flonnie Overman.   HOSPITAL COURSE:  1. Chest pain and dyspnea.  Ms. Blatchford was admitted via the emergency      room with complaints of worsening chest pressure and shortness of      breath.  It has become apparent that Ms. Campanelli who is a diabetic      with hypertension has been noncompliant with her medications.  Ms.      Aden underwent medical stabilization with glucose control and blood      pressure regulation, and then underwent a Myoview stress testing on      April 17, 2007.  The Myoview stress test indicated presence of      reversible ischemia in the inferior wall.  For this reason, the      patient was further evaluated with a cardiac catheterization on      April 19, 2007, which did not reveal severe obstructive coronary      artery disease.  For this point in time, risk factor modification      and aspirin is the treatment of choice for the patient's initial      presentation.  2. Uncontrolled diabetes type 2.  Ms. Wease has carried the diagnosis      of diabetes for a number of years prior to admission.  She has      chosen to ignore her medical condition and not  to take any      treatment.  Ms. Vickrey's measured hemoglobin A1c was 14.4.  Ms. Seldon      underwent extensive education about the importance of medical      compliance and treatment.  She is to be treated with Amaryl,      metformin, and Lantus and to follow up with her new primary care      physician within a week.  3. Hyperlipidemia.  The patient's fasting lipid panel indicates an LDL      of 87.  Giving her severe diabetes mellitus, we feel that the      patient will benefit from a statin in the form of simvastatin 40 mg      at bedtime.  4. Severe hypertension.  We have started Ms. Veillon on metoprolol and we      will add a diuretic at the time of the discharge.  It is      conceivable that we will not be able to achieve perfect blood      pressure control until we get also the sleep apnea under control.      Ms. Gaughran will be scheduled for a sleep study to address that issue.  5. Tobacco abuse.  Ms. Lynam has received extensive counseling      throughout this admission.  She is prescribed a nicotine patch at      the time of discharge.      Lonia Blood, M.D.  Electronically Signed     SL/MEDQ  D:  04/19/2007  T:  04/20/2007  Job:  [id number]   cc:   Lonia Blood, M.D.  [facility]

## 2010-06-11 NOTE — Op Note (Signed)
[name], [name]                ACCOUNT NO.:  [id number]   MEDICAL RECORD NO.:  [id number]          PATIENT TYPE:  INP   LOCATION:  5014                         FACILITY:  [facility]   PHYSICIAN:  Feliberto Gottron. Turner Daniels, M.D.   DATE OF BIRTH:  09/19/1950   DATE OF PROCEDURE:  DATE OF DISCHARGE:                               OPERATIVE REPORT   PREOPERATIVE DIAGNOSIS:  End-stage arthritis, right knee.   POSTOPERATIVE DIAGNOSIS:  End-stage arthritis, right knee.   PROCEDURE:  Right total knee arthroplasty using DePuy Sigma RP  components 4 femur, 4 tibia, 41 patellar button, 10-mm Sigma RP spacer,  and double batch of DePuy HV cement with 1500 mg of Zinacef.   SURGEON:  Feliberto Gottron. Turner Daniels, MD   FIRST ASSISTANT:  Shirl Harris, PA   ANESTHETIC:  General endotracheal.   TOURNIQUET TIME:  1 hour and 30 minutes.   DRAINS PLACED:  Two medium Hemovac and Foley catheter.   URINE OUTPUT:  300 mL.   ESTIMATED BLOOD LOSS:  Zero.   The case was done under tourniquet.   INDICATIONS FOR PROCEDURE:  A 60 year old woman with end-stage arthritis  of the right knee.  She has failed conservative treatment, anti-  inflammatory medicines, viscosupplementation, and arthroscopic  debridement and was noted to have grade 4 bone-on-bone arthritic changes  to both compartments on the knee when the arthroscopy was done.  She  desires elective right total knee arthroplasty to decrease pain and  increase function.  Risks and benefits of surgery discussed, questions  answered.   DESCRIPTION OF PROCEDURE:  The patient identified by armband and  underwent right femoral nerve block in the block area of [facility].  She was then taken to operating room 1.  After receiving 2 g of Ancef,  appropriate site monitors were attached and general endotracheal  anesthesia was insertion was induced.  A Foley catheter was then  inserted.  Lateral post and a foot positioner applied to the table.  Tourniquet applied high to  the right thigh.  We used a Zimmer tourniquet  because of her obesity, and the right lower extremity prepped and draped  in the usual sterile fashion from the ankle to the tourniquet.  Standard  time-out procedure was then performed, the limb wrapped with an Esmarch  bandage, tourniquet inflated to 350 mmHg.  We began the procedure by  making a standard anterior midline incision starting a handbreadth above  the patella going over the patella 1 cm medial and 2 and 3 cm distal to  the tibial tubercle.  Small bleeders in the skin and subcutaneous tissue  identified and cauterized.  Transverse retinaculum was incised,  reflected medially allowing a medial parapatellar arthrotomy.  Patella  was everted.  Prepatellar fat pad resected.  Superficial medial  collateral ligament was elevated from anterior to posterior leaving it  intact distally coming off the proximal tibia.  This allowed [place] to  hyperflex the knee exposing the cruciate ligaments and the anterior half  of the menisci which were then resected with the electrocautery.  Notch  osteophytes were then removed.  A posterior medial Z retractor was  placed, a MacKay retractor through the notch, and a lateral Homan  retractor placed.  Proximal tibia was entered in the center coaxial with  the tibial canal with a DePuy step drill followed by an intramedullary  rod and a 1-degree posterior slope cutting guide was pinned into place  allowing resection of about 8 mm of bone and cartilage medially and 9 mm  of bone and cartilage laterally.  Posterior structures were protected  with the retractors.  We then entered the distal femur 2 mm anterior to  the PCL origin again with a step drill followed by the IM rod and a 5-  degree right distal femoral cutting guide set at 11 mm, pinned along the  epicondylar axis and sized for a #4 femoral component and drilled in  neutral rotation.  I then applied the chamfer cutting guide which was  screwed into  place.  The anterior and posterior chamfer cuts were  accomplished without difficulty followed by the Sigma RP box cut.  Patella was measured at 22 mm, felt to fit a 41 button.  We set the  cutting guide at 14 because of the overall thickness of the patella,  standard patellar cut was then accomplished.  The patella did size to a  41 lollipop and was drilled.  We then once again hyperflexed the knee,  sized for #4 tibial baseplate, pinned it in place, applied the  smokestack, did the smokestack conical ream followed by the Deltafit  keel.  A 4 right trial femoral component was hammered into place.  The  lugs were drilled.  A 10-mm Sigma RP spacer was placed on the tibial  plate, 41 trial button on the patella, and the knee was reduced.  It did  come to full extension, flexed to 130 degrees.  There were no blockage  to motion, no liftoff of the tibia, and the wound was irrigated out with  normal saline solution.  The trial components were removed.  We once  again water picked the bony surfaces clean, dried with suction and  sponges, and at the back table a double batch of DePuy HV cement with  1500 mg of Zinacef was mixed and applied to all bony and metallic mating  surfaces.  In order, we then hammered into place a #4 tibial baseplate  and removed excess cement.  A four right femoral component removed  excess cement.  A 41 patellar button was then squeezed into place and  clamped.  A 10-mm Sigma RP spacer was snapped into place, the knee  reduced, and held in extension with compression as the cement cured.  The wound was once again water picked clean.  Excess cement was removed  as it was curing.  The patella was then unclamped, the knee taken  through range of motion one more time to confirm good tracking of the  patella.  Medium Hemovac drains were placed deep in the wound which was  then closed using running #1 Vicryl suture and the parapatellar  arthrotomy, undyed 0 and 2-0 Vicryl  suture in the subcutaneous tissue  and skin staples.  A dressing of Xeroform, 4 x 4 dressing, sponges,  Webril, and Ace wrap was then applied.  Tourniquet let down.  The  patient awakened and taken to the recovery room without difficulty.      Feliberto Gottron. Turner Daniels, M.D.  Electronically Signed     FJR/MEDQ  D:  09/13/2007  T:  09/13/2007  Job:  [id number]

## 2010-06-14 NOTE — Discharge Summary (Signed)
[name], [name]                ACCOUNT NO.:  [id number]   MEDICAL RECORD NO.:  [id number]          PATIENT TYPE:  INP   LOCATION:  5014                         FACILITY:  [facility]   PHYSICIAN:  Feliberto Gottron. Turner Daniels, M.D.   DATE OF BIRTH:  [birth year]   DATE OF ADMISSION:  09/13/2007  DATE OF DISCHARGE:  09/16/2007                               DISCHARGE SUMMARY   CHIEF COMPLAINT:  Right knee pain.   HISTORY OF PRESENT ILLNESS:  This is a 60 year old woman with  unremitting pain in her right knee despite conservative treatment with  anti-inflammatory medicines, viscosupplementation, and knee arthroscopy.  She was noted to have grade 4 bone-on-bone arthritic changes to the  medial and lateral compartment of the knee joint arthroscopy.  She  desires a surgical intervention at this time.  All risks and benefits of  surgery were discussed with the patient.   Her past medical history is significant for diabetes mellitus and  hypertension.   PAST SURGICAL HISTORY:  She is status post bilateral total hip  arthroplasties in 2006 and 2007.   SOCIAL HISTORY:  She smokes 5 cigarettes a day.  She denies alcohol use.   Family history is noncontributory.   Drug allergies include MORPHINE, which causes nausea and vomiting.   Current medications include:  1. Metoprolol ER 100 mg one and half tab daily.  2. Metformin 500 mg 1 p.o. b.i.d.  3. Triamterene 37.5/25 mg 1 p.o. daily.  4. Glimepiride 2 mg 1 p.o. daily.   PHYSICAL EXAMINATION:  Gross examination of the right knee demonstrates  the patient has tenderness to palpation along the medial and lateral  joint line.  Range of motion is estimated to be 5-115 degrees.  She is  neurovascularly intact.  X-rays demonstrate bone-on-bone degenerative  changes in the medial and lateral compartments of the right knee.   Preop labs showed white blood cells 10.1, red blood cells 3.9,  hemoglobin 12.6, hematocrit 36.8, platelets 457.  PT 12.6, INR 0.9,  PTT  27.  Sodium was 139, potassium 4.8, chloride 102, glucose 131, BUN 15,  creatinine 0.77.  Her urinalysis was within normal limits.   HOSPITAL COURSE:  [name] was admitted to [facility] on September 13, 2007, and she underwent a right total knee arthroplasty performed by Dr.  Gean Birchwood.  She tolerated the procedure well.  A perioperative Foley  catheter was placed.  Two Hemovac drains were placed in the right knee  and the patient was transferred to the orthopedic floor.  On the first  postoperative day, the patient's hemoglobin was 10.  Her surgical drains  were removed.  She was evaluated by physical therapy and was transferred  from the bed to the chair.  She denied any nausea or vomiting and  reported that her pain was well controlled.  On the second postoperative  day, the patient reported minimal pain in the right knee.  Her  hemoglobin was 10.1.  Her surgical dressing was changed.  Her incision  appeared benign.  She was able to ambulate 150 feet with physical  therapy.  On the third postoperative day, the patient continued to  report minimal pain in the right knee.  Her hemoglobin was 10.0.  Her  surgical dressing was clean and dry.  She was eating well and ambulating  independently with a walker.  She climbed stairs with physical therapy  and was discharged to her home.   DISPOSITION:  The patient was discharged to home on September 16, 2007.  [facility] managed her wound, Coumadin, and physical therapy.  She was weightbearing as tolerated and was asked to return to the clinic  in 1 week.  Her discharge medicines were as per the HMR with the  addition of:  1. Percocet 5 mg tablets 1-2 tablets p.o. q.4 h. p.r.n. pain.  2. Coumadin 5 mg tablets to take as directed.  3. Robaxin 500 mg tablets 1 p.o. b.i.d. p.r.n. spasm.   FINAL DIAGNOSIS:  End-stage degenerative joint disease of the right  knee.      Shirl Harris, PA      Feliberto Gottron. Turner Daniels, M.D.   Electronically Signed    JW/MEDQ  D:  10/20/2007  T:  10/21/2007  Job:  [id number]

## 2010-06-14 NOTE — Discharge Summary (Signed)
[name], [name]                ACCOUNT NO.:  [id number]   MEDICAL RECORD NO.:  [id number]          PATIENT TYPE:  INP   LOCATION:  3736                         FACILITY:  [facility]   PHYSICIAN:  Feliberto Gottron. Turner Daniels, M.D.   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   PROCEDURE WHILE IN HOSPITAL:  Left total hip arthroplasty.   HISTORY OF PRESENT ILLNESS:  The patient is a 60 year old woman seen in  consultation from Dr. Reche Wilson for evaluation and treatment of end-stage  arthritis of both hips, left hurting worse than right, although the right  was slightly worse on x-ray.  Both hips had diminished articular cartilage.  MRI scan showed extensive subchondral cyst changes and because of persistent  and unremitting and increasing pain, she is taken for a left total hip  arthroplasty and will probably have right total hip arthroplasty at some  point in the future if the left hip does well.  The risks and benefits of  the surgery were discussed in advance and questions answered in the office,  and she wishes to proceed.   ALLERGIES:  MORPHINE, which causes nausea.   MEDICATIONS AT TIME OF ADMISSION:  Percocet, prednisone and  hydrochlorothiazide; the prednisone was actually stopped a week prior to  surgery.   PAST MEDICAL HISTORY:  Usual childhood diseases.   Adult history:  1.  Hypertension.  2.  DJD.   SURGICAL HISTORY:  Tubal ligation.  No difficulty with GOT.   SOCIAL HISTORY:  Positive for tobacco, 3 cigarettes per day.  No ethanol.  No IV drug use.  She is disabled and lives with her nephew.   FAMILY HISTORY:  Mother died at 23 from hypertension and diabetes.  Father  died at 86 secondary to alcohol abuse.   REVIEW OF SYSTEMS:  Positive for glasses.  She denies dentures.  No  shortness of breath, chest  pain or recent illness.   PHYSICAL EXAMINATION:  VITAL SIGNS:  Temperature 98.3, pulse 72, blood  pressure 150/96.  GENERAL:  The patient is a 5-foot 7-inch, 195-pound female.  HEENT:  Head is normocephalic.  Ears:  TMs are clear.  Eyes:  Pupils equal,  round and reactive to light and accommodation.  Nose is patent.  Throat:  Benign.  Mouth is notable for poor dentition with missing teeth.  NECK:  Supple.  Full range of motion.  CHEST:  Clear to auscultation and percussion.  HEART:  Regular rate and rhythm.  ABDOMEN:  Soft and nontender.  EXTREMITIES:  Bilateral hip pain with all ranges of motion and  weightbearing, left hip, internal rotation of 5 degrees, external rotation  of 5 degrees with positive foot tap.  SKIN:  Within normal limits.  No breaks.   LABORATORY AND ACCESSORY CLINICAL DATA:  Preoperative labs including CBC,  CMET, chest x-ray, EKG, PT and PTT were within normal limits with the  exception of platelet count  of 441,000, glucose of 101.   HOSPITAL COURSE:  On the date of admission, the patient was taken to the  operating room at [facility], where she underwent a left total hip  arthroplasty using DePuy SROM components with a 52 ASR cup, a 20 x 15 x 42 x  165 stem, a 20-D large cone, a +0 46-mm ASR ball.  Foley catheter was placed  preoperatively.  The patient was placed on a PCA Dilaudid pump for pain  control.  Physical therapy was begun the first postoperative day.  Perioperative antibiotics were begun at the time of surgery.  Postoperative  Coumadin prophylaxis was begun with pharmacy protocol.  It was found that  the patient had cardiac changes during surgery with EKG showing inverted T  waves and slightly more accentuated than on previous EKG, so she was  admitted to a monitored bed for observation and her labs were checked for  any type of cardiology complication as well as Cardiology consult was  obtained from Dr. Myrtis Ser of [facility].  The first  postoperative day,  the patient was thought to be cardiologically stable with low troponin  levels and no changes on telemetry.  Orthopedically, she was also stable, T-  max 101, ranging to 99, wound was clean and dry, hemoglobin 11.8, WBC of  15.4, urine output was some 400 mL per shift, PT 14.1, x-ray showing well-  placed total hip arthroplasty.  Exam showed her to be neurovascularly  intact, otherwise appearing well.  She began physical therapy and was okayed  for transfer to orthopedic floor when cleared by Dr. Myrtis Ser of Cardiology  after her 2-D echo.  Postoperative day 2, the patient was without complaint.  The echo showed moderate LVH, ejection fraction of 55%.  She was okayed to  go to orthopedic floor by Cardiology.  She was complaining of moderate pain  in her hip.  Hemoglobin 11.1.  INR 1.1.  T-max 101.7, ranging to 98.6.  Dressing was dry; wound was benign.  She began physical therapy and  incentive spirometer was encouraged.  Foley was discontinued after checking  UA.  Postoperative day 3, the patient was complaining of minimal pain.  Hemoglobin 10.7.  She was afebrile.  INR 1.1.  UA yesterday was positive for  small amounts of bacteria, but no signs or symptoms of UTI today.  PCA was  discontinued.  She was awaiting a heart scan that day.  Postoperative day 4,  the patient was without complaint.  Her bradycardia had resolved.  Her left  ventricular dysfunction was seen to be stable and she will follow up with  Dr. Jimmey Ralph in 2 weeks.  She was complaining of minimal pain in her left hip,  T-max 100.4, INR 1.2.  Dressing was dry.  Wound was benign.  She continued  to make progress with physical therapy, but had not met all of her rehab  goals including independent transfer from bed 2 walking.  Postoperative day  5, the patient was awake and alert, walking well and hoping to go home.  Vital signs were stable.  She was afebrile.  Dressing was clean and dry. She was neurovascular  injury.  INR 1.2.  She was otherwise stable.  Wound  was clean and dry.  Cardiac cleared her for any sort of active disease  process.   FINAL DIAGNOSES:  1.  Osteoarthritis of the left hip.  2.  Bradycardia.   DISPOSITION:  She was discharged home to the care of  her family.   FOLLOWUP:  Return to clinic with Dr. Turner Daniels in approximately 1 week's time, 2  weeks with Cardiology.   DISCHARGE MEDICATIONS:  Prescriptions were given for Percocet, Coumadin and  Robaxin.  She will continue the use of hydrochlorothiazide at home.   DIET:  Regular.   ACTIVITY:  Weightbearing as tolerated with total hip precautions.   SPECIAL DISCHARGE INSTRUCTIONS:  She will continue with Coumadin prophylaxis  for the next 2 weeks per [facility].   WOUND CARE:  She will use dressing changes, simple gauze and tape daily or  as needed.      Laural Benes. Jannet Mantis.      Feliberto Gottron. Turner Daniels, M.D.  Electronically Signed    JBR/MEDQ  D:  04/14/2005  T:  04/15/2005  Job:  [id number]

## 2010-06-14 NOTE — Discharge Summary (Signed)
[name], [name]                ACCOUNT NO.:  [id number]   MEDICAL RECORD NO.:  [id number]          PATIENT TYPE:  INP   LOCATION:  5023                         FACILITY:  [facility]   PHYSICIAN:  Feliberto Gottron. Turner Daniels, M.D.   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   PROCEDURE WHILE IN HOSPITAL:  Right total hip arthroplasty.   DISCHARGE SUMMARY:  Patient is a 59 year old woman with end-stage arthritis  of both hips.  She had a left total hip in January of 2007 and has done very  well.  She now desires a right total hip to decrease pain and increase  function.  Risks and benefits of surgery are well known to the patient.  Questions were answered.  She is prepared for surgical intervention.  Patient has a reaction to MORPHINE, causing nausea.   MEDICATIONS:  Patient has once again stopped taking her blood pressure  medication.  We have instructed her she needs to contact [facility] to  restart her medications.   PAST MEDICAL HISTORY:  Usual childhood diseases.  Adult history:  Hypertension and DJD.   SURGICAL HISTORY:  Tubal ligation, left total hip arthroplasty December  2006.  No difficulties with DVT.   PATIENT'S SOCIAL HISTORY:  Positive for tobacco, 3 cigarettes a day.  No  ethanol. No IV drug abuse.  She is disabled.   FAMILY HISTORY:  Mother died with a history of hypertension and diabetes.  Father died from a history of ethanol and age is unknown.   REVIEW OF SYSTEMS:  Positive for glasses.  No dentures, shortness of breath  or recent illness.   EXAM:  VITAL SIGNS:  Temperature 98.6.  Pulse 80.  Respirations were not  measured.  Blood pressure 142/102.  Patient is 5 foot, 7 inches, 235 pound  female.  HEENT is positive for missing teeth and poor dentition.  Head is  normocephalic.  Ears:  TMs are clear.  Eyes:  Pupils equal round and  reactive to light and accommodation.  Nose:  Patent.  Throat:  Benign.  NECK:  Supple.  Full range of motion.  CHEST:  Clear to auscultation to percussion.  HEART:  Regular rate and rhythm.  ABDOMEN:  Soft, nontender.  EXTREMITIES:  Right hip:  Forward flexion of 80 degrees, 20 degrees of  external rotation and internal rotation with pain.  Positive foot tap.  Left  hip:  Forward flexion 105, 30 degrees of internal and external rotation.  Negative foot tap.  Skin is positive for well-healed normal scar on the left hip.  Right hip is  clear.   X-ray showed bone-on-bone change of the right hip and a well-placed, well-  fixed left total hip.   Preoperative labs, including CBC, CMET, chest x-ray, EKG, PT and PTT were  all within normal limits with the exception of an EKG, which showed a left  axis deviation  and an incomplete right bundle branch block and left  ventricular hypertrophy.  There was no comparison study to be reported.  Patient was also noted to have a blood glucose of 148.   HOSPITAL COURSE:  On the day of admission, the patient was taken to the  operating room at [facility], where she underwent a right total hip  arthroplasty using a DePuy 54-mm ASR cup, 47-mm ASR ball and NK+0 neck,  20  x 15 x 160 x 42 stem with a 20-B small cone.  Foley catheter was placed  perioperatively.  Patient was given antibiotics perioperatively.  She was  placed on postoperative Coumadin prophylaxis with bridging Lovenox therapy  as well.  Physical therapy was begun the day of surgery.  Postoperative day  1, the patient was awake and alert.  Reporting nausea and vomiting the day  before, but none that morning.  T-max 101.3.  WBC of 16.9, hemoglobin 11.1,  PT of 14.3.  Urine output 1355 mL.  Dressing was clean and dry.  She was  neurovascularly intact to motors and light touch distally.  She was  otherwise stable with  probable postoperative fever from atelectasis, so  incentive spirometer was encouraged.  She continued with physical therapy,  weightbearing as tolerated with total hip precautions.  X-ray showed well-  placed, well-fixed component of total hip.  Postoperative day 2, the patient  was without complaint.  T-max 101.6, hemoglobin 10.7, WBC of 16, INR 1.4.  Dressing was dry.  Patient was noted to have had a positive bacteria on  perioperative urinalysis and Septra DS was begun for possible UTI.  Foley  was discontinued after a UA was drawn and PCA was discontinued.  Postoperative day 3, patient was awake and alert.  She was taking p.o. well.  Vital signs were stable.  She was afebrile.  Wound was clean and dry.  INR  of 1.5, hemoglobin 10.5.  She was otherwise neurovascularly intact.  She had  not yet met all of her physical therapy goals, so she was to stay in the  hospital for an additional day.  Later on that day, although she had not met  her physical therapy goals, the patient left against medical advice.  She  was urged to follow up with our office and was told that we would hope that  [facility] would be allowed to continue their care for her  postoperatively.  She will follow up with Dr. Turner Daniels in approximately 1  week's time.  She should be weightbearing as tolerated with total hip  precautions and hopefully she will remember this as once again she left AMA  and so might not have had as good as counseling as one would have hoped.  The patient did not have prescriptions written.  Hopefully, she will contact  our office and have her pain medications and Coumadin prescriptions written  for her or we will certainly be glad to call those in.      Erskine Squibb B. Jannet Mantis.      Feliberto Gottron. Turner Daniels, M.D.  Electronically Signed    JBR/MEDQ  D:  10/27/2005  T:  10/27/2005  Job:  [id number]

## 2010-06-14 NOTE — Op Note (Signed)
NAMEGRISELLE, [name]                ACCOUNT NO.:  [id number]   MEDICAL RECORD NO.:  [id number]          PATIENT TYPE:  INP   LOCATION:  3736                         FACILITY:  [facility]   PHYSICIAN:  Feliberto Gottron. Turner Daniels, M.D.   DATE OF BIRTH:  08-Jan-1951   DATE OF PROCEDURE:  01/29/2005  DATE OF DISCHARGE:                                 OPERATIVE REPORT   PREOPERATIVE DIAGNOSIS:  Is end-stage arthritis of the left hip.   POSTOPERATIVE DIAGNOSIS:  Is end-stage arthritis of the left hip.   PROCEDURE:  Left total hip arthroplasty using DePuy/SROM components. We used  a 52 ASR cup, 20 x 15 x 42 x 165 stem, 20D large cone, +0 46 mm ASR ball.   SURGEON:  Feliberto Gottron. Turner Daniels, M.D.   FIRST ASSISTANT:  None.   ANESTHETIC:  General endotracheal.   ESTIMATED BLOOD LOSS:  300 mL.   FLUID REPLACEMENT:  1 liter of crystalloid.   URINE OUTPUT:  300 mL.   DRAINS PLACED:  None   INDICATIONS FOR PROCEDURE:  A 60 year old woman presented to our office in  consultation from Dr. Reche Wilson for evaluation and treatment of end-stage  arthritis of both hips, the left hurting worse than the right, although the  right was slightly worse on x-ray. Both hips had diminished articular  cartilage. MRI scan showed extensive subchondral cysts changes and because  of persistent unremitting increasing pain, she is taken for left total hip  arthroplasty and will probably have right total hip arthroplasty at some  point in the future if the left total hip does as well as we think it will.  Risks, benefits of surgery discussed in advance and questions answered in  the office.   DESCRIPTION OF PROCEDURE:  The patient identified by armband, taken to the  operating room at [facility]. Appropriate anesthetic monitors were  attached and general endotracheal anesthesia induced with the patient in  supine position. Foley catheter was inserted. She was then rolled into the  right lateral decubitus position, fixed there with  Trecia Rogers II pelvic  clamp. The left lower extremity prepped, draped in sterile fashion from the  ankle to the hemipelvis. She did receive a gram of Ancef IV perioperatively  and after prep and drape of the left lower extremity, the skin along the  lateral hip and thigh was infiltrated 20 mL of half percent Marcaine and  epinephrine solution into the subcutaneous tissue. A 20-cm posterolateral  approach incision to the hip was then made centered over the greater  trochanter through the skin and subcutaneous tissue down to the level of the  IT band. Small bleeders were identified and cauterized with electrocautery.  The IT band was cut in line with the skin incision going up over the greater  trochanter and exposing the greater trochanter, the piriformis, and short  external trochanteric crest and a Cobra retractor was placed between the  gluteus minimus and the superior hip joint capsule, a second being between  the quadratus femoris and the inferior hip joint capsule. We then performed  a capsulotomy going from  posterior-superior on the acetabulum out to the  femoral neck inferiorly and then posterior inferiorly back out to the  acetabulum and tagged this flap of capsular tissue with two #2 Ethibond  suture, allowing [place] to flex and internally rotate the hip dislocating the  femoral head. Standard neck cut was performed one fingerbreadth above the  lesser trochanter and we then translocated the proximal femur anteriorly,  levering it off the anterior column of the acetabulum with a Homan  retractor. Posterior-superior and posterior inferior wing retractors were  then placed and a spike Cobra inferior aspect of the cotyloid notch,  exposing the acetabulum. The labrum was removed with electrocautery as was  the pulvinar tissue. We then sequentially reamed up to a 51 mm basket reamer  obtaining good coverage or good cut into the subchondral bone, sized for a  52 trial and hammered into  place a 52 ASR cup in 45 degrees of abduction and  about 20 degrees of anteversion. The cup seated nicely with good fit and  fill. Hip was then flexed, internally rotated exposing the proximal femur  which was then entered with a round box cutting chisel from the SROM set  followed by the initiator reamer followed by sequential reaming up to a 15.5  axial reamer obtaining good cortical chatter followed by conical milling up  to a 20D cone and countersinking about 4 mm passed the 42 neck mark on the  greater trochanter. We then performed a standard calcar milling up to a D  large calcar and a 20D large calcar trial was placed followed by a 20 trial  stem with a 42 base neck set at the same version as the calcar. +0 46 trial  ball was then placed on the stem. The hip was reduced and excellent  stability noted to flexion of 90 with internal rotation of 70 and she could  __________  20D large ZTT1 cone followed by a 20 x 15 x 42 x 165 stem in  slightly less anteversion than the calcar about 5 degrees, followed 46  ultimate ball. Hip was once again reduced and stability noted to be  excellent. The leg lengths were felt to be within about a quarter inch of  the contralateral side which was slightly short because of the arthritis it  had. The wound was irrigated out with normal saline solution once again and  no significant bleeding was noted and at this time the acetabular based flap  and the piriformis repaired back to the intertrochanteric crest through  drill holes with a #2 Ethibond suture and 2-0 undyed Vicryl suture and the  skin with running interlocking 3-0 nylon suture. A dressing of Xeroform, 4x4  dressing, sponges, Hypafix, unclamped, rolled supine, awakened and taken to  the recovery room without difficulty.      Feliberto Gottron. Turner Daniels, M.D.  Electronically Signed     FJR/MEDQ  D:  01/29/2005  T:  01/30/2005  Job:  [id number]

## 2010-06-14 NOTE — Op Note (Signed)
NAMEARLEENE, SETTLE                ACCOUNT NO.:  [id number]   MEDICAL RECORD NO.:  [id number]          PATIENT TYPE:  INP   LOCATION:  2550                         FACILITY:  [facility]   PHYSICIAN:  Feliberto Gottron. Turner Daniels, M.D.   DATE OF BIRTH:  10-22-1950   DATE OF PROCEDURE:  08/18/2005  DATE OF DISCHARGE:                                 OPERATIVE REPORT   PREOPERATIVE DIAGNOSIS:  End-stage arthritis right hip.   POSTOPERATIVE DIAGNOSIS:  End-stage arthritis right hip.   PROCEDURE:  Right total hip arthroplasty using DePuy 54-mm ASR cup, 47-mm  ASR ball, NK+0 neck, 20 x 15 x 160 x 42 stem, 20B small cone.   SURGEON:  Feliberto Gottron. Turner Daniels, M.D.   FIRST ASSISTANT:  Skip Mayer, PA-C.   ANESTHETIC:  General endotracheal.   ESTIMATED BLOOD LOSS:  200 mL.   FLUID REPLACEMENT:  1500 mL crystalloid.   URINE OUTPUT:  300 mL.   DRAINS PLACED:  Foley catheter.   INDICATIONS FOR PROCEDURE:  A 60 year old woman, end-stage arthritis of both  hips, had a left total hip, done by me in January 2007, and has done very  well, now desires right total hip to decrease pain and increase function.  Risks and benefits of surgery are well-known to the patient.  Questions  answered.  She is prepared for surgical intervention.   DESCRIPTION OF PROCEDURE:  The patient identified by armband, taken the  operating room at [facility].  Appropriate anesthetic monitors were  attached, and general endotracheal anesthesia induced with the patient in  supine position.  A Foley catheter was then inserted, and she was rolled  into the left lateral decubitus position, affixed there with a Stulberg Mark  II pelvic clamp and the right lower extremity prepped and draped in usual  sterile fashion, from the ankle the hemipelvis.  Skin along the lateral hip  and thigh was infiltrated with 10 mL of half percent Marcaine and  epinephrine solution.  A 20 cm incision, allowing a posterolateral approach  to the hip joint, was  made through the skin and subcutaneous tissue, down to  the level of the IT band, which was cut long skin incision, exposing the  greater trochanter.  Cobra retractors were placed between the gluteus  minimus and superior hip joint capsule, between the quadratus femoris and  the inferior hip joint capsule, allowing [place] to identify and tag the  piriformis and short external rotators, then cut them off their insertion on  the intertrochanteric crest, exposing the posterior aspect of the hip joint  capsule which was then developed into a posterior acetabular based flap from  posterior-superior to posterior-inferior.  This was also tagged with two #2  Ethilon sutures, exposing the femoral neck and head, which was then flexed  and internally rotated, dislocating the arthritic femoral head which had  large osteophytes and bone-on-bone arthritic changes superiorly along the  weightbearing dome.  A standard neck cut was then performed one  fingerbreadth above the lesser trochanter, and the femoral head removed.  The proximal femur was then translocated anteriorly, levering off  the  anterior column of the acetabulum with the Homan retractor.  A spike cover  was placed in the cotyloid notch, and then posterior-superior and posterior-  inferior wing retractors placed, exposing the labrum and the acetabulum.  The labrum was then removed with the electrocautery, and the acetabulum  sequentially reamed up to 54 mm basket reamer, obtaining good contact in all  quadrants.  This was followed by a 54-mm ASR shell inserted in 45 degrees of  abduction and 20 degrees of anteversion with a good tight fit.  The hip was  then flexed and internally rotated, exposing the proximal femur, which was  entered with a box cutting chisel, the initiator, and then sequentially  reamed up to a 15.5 axial cylindrical reamer, obtaining good cortical  chatter from 15 and 15.5 reamings.  We then conically reamed up to a 15B  cone  and calcar milling up to a 20B small calcar, followed by a 20B small  trial cone, and then a 15-mm trial stem with a 42 neck and a NK+0, 47-mm  ball, in the same version as the calcar, about 20 degrees.  The hip was then  reduced, flexed to 90 degrees with 60 of internal rotation easily with no  instability and could not be dislocated in full extension, and there was a  negative shuck test.  Leg lengths appeared to be symmetric.  At this point  the trial components removed.  The wound was thoroughly irrigated with  normal saline solution and a 20B small cone was hammered into place,  followed by a 20 x 15 x 42 neck x 160 length stem in the same version as the  cone, followed by an NK+0 module and a 47-mm ASR ball.  The hip was once  again reassembled.  Stability noted to be excellent and the wound irrigated  out with normal saline solution.  Small bleeders were once again identified  and cauterized.  The capsular flap and piriformis were then sutured back to  the intertrochanteric crest through drill holes with a #2 Ethilon suture.  The IT band was closed with running #1 Vicryl suture.  The subcutaneous  tissue with 0-0 and 2-0 undyed Vicryl suture, and the skin with running  interlocking 3-0 nylon suture.  A dressing of Xeroform, 4x4 dressing,  sponges, Hypafix tape was applied.  The patient was unclamped, rolled  supine, awakened and taken to the recovery room without difficulty.      Feliberto Gottron. Turner Daniels, M.D.  Electronically Signed     FJR/MEDQ  D:  08/18/2005  T:  08/18/2005  Job:  [id number]

## 2010-06-14 NOTE — Consult Note (Signed)
[name], [name]                ACCOUNT NO.:  [id number]   MEDICAL RECORD NO.:  [id number]          PATIENT TYPE:  INP   LOCATION:  2550                         FACILITY:  [facility]   PHYSICIAN:  Willa Rough, M.D.     DATE OF BIRTH:  31-Jan-1950   DATE OF CONSULTATION:  01/29/2005  DATE OF DISCHARGE:                                   CONSULTATION   I was called to see the patient to help assess EKG changes and bradycardia  in the operating room at the time of the patient's hip repair.   The information available at this time is limited. The patient has a history  of hypertension. Preoperative EKG did show diffuse ST, T wave changes. The  patient has not been having any significant chest pain.   In the operating room the patient had some junctional rhythm and some  Wenckebach. She was not unstable with this. She was seen by anesthesia and  was stable. It was felt that she should be monitored postoperatively and  cardiology called.   PAST MEDICAL HISTORY:   ALLERGIES:  Question of nausea from MORPHINE.   MEDICATIONS:  Percocet and question of Prednisone.   OTHER MEDICAL PROBLEMS:  See the list below.   SOCIAL HISTORY:  The patient does smoke a small amount. She is disabled and  lives with a nephew.   FAMILY HISTORY:  There is no strong documented family history of coronary  disease.   REVIEW OF SYSTEMS:  The patient wears glasses. She has had no significant  symptoms and her review of systems is negative.   PHYSICAL EXAMINATION:  GENERAL:  The patient is sleepy postoperatively.  VITAL SIGNS:  Her current blood pressure is 150/70 and her current heart  rate is 60's, O2 saturations are 100% on 3 liters. Respiration is normal at  18.  LUNGS:  Reveal a few scattered rhonchi.  HEENT:  Reveals no xanthyl asthma. She has normal extraocular motion. There  are no carotid bruits. There is no jugular venous distension.  CARDIAC EXAM:  Reveals an S1 with an S2. There are no clicks of  significant  murmurs.  ABDOMEN:  Soft.  MUSCULOSKELETAL:  The patient's hip has bandages in place.   EKG shows sinus bradycardia with diffuse ST, T wave changes most compatible  with left ventricular hypertrophy. Ischemia cannot be ruled out. There is no  marked change from the preoperative tracing.   PROBLEMS INCLUDE:  1.  History of hypertension.  2.  Severe degenerative joint disease affecting the hip.  3.  Status post left total hip repair today.  4.  EKG changes compatible with left ventricular hypertrophy. Ischemia      cannot be ruled out.  5.  Bradycardia in the operating room.   PLAN:  Monitor the patient on telemetry. Cardiac enzymes will be obtained.  She will have a 2D echocardiogram to assess LV function.           ______________________________  Willa Rough, M.D.     JK/MEDQ  D:  01/29/2005  T:  01/29/2005  Job:  [id number]  cc:   Dineen Kid. Reche Dixon, M.D.  Fax: [phone]

## 2010-06-14 NOTE — Group Therapy Note (Signed)
NAME:  [name], [name]                          ACCOUNT NO.:  [id number]   MEDICAL RECORD NO.:  [id number]                   PATIENT TYPE:  OUT   LOCATION:  [facility]                           FACILITY:  [facility]   PHYSICIAN:  Argentina Donovan, MD                     DATE OF BIRTH:  Oct 21, 1950   DATE OF SERVICE:  04/25/2003                                    CLINIC NOTE   REASON FOR VISIT:  The patient is a 60 year old gravida 4 para 4-0-0-4  weighing 219 pounds with a blood pressure 181/107 who has been sent from [facility]  after she had been in because of right groin and lower abdominal pain.  In  discussing with the patient she has had this pain for about a years, works  at a hotel part-time, and is having a difficult time functioning.  The pain  starts in the sacroiliac area, runs down the back of her legs.  She also has  excruciating right groin pain on movement.  Physical exam is nonrevealing.  There is no sign of any hernia.  Her abdomen is soft, flat, nontender; no  masses, no organomegaly.  Breast exam is large, pendulous, symmetrical, with  no dominant masses.  External genitalia is normal, BUS within normal limits.  The vagina is clean and well rugated and the cervix was clean and parous and  a Pap smear was taken.  The uterus could not be well outlined but ultrasound  shows normal ovaries with a small fibroid uterus.  Pap smear and GC and  chlamydia were taken.   IMPRESSION:  1. Uncontrolled hypertension for which we will start her on some blood     pressure medication.  2. Sciatica with right groin pain, probably secondary to nerve compression.     We will refer her to an orthopedic surgeon, order a CAT scan of the     lumbar vertebra.  3. Normal gynecological examination.                                               Argentina Donovan, MD    PR/MEDQ  D:  04/25/2003  T:  04/25/2003  Job:  [id number]

## 2010-09-14 ENCOUNTER — Emergency Department (HOSPITAL_COMMUNITY): Payer: PRIVATE HEALTH INSURANCE

## 2010-09-14 ENCOUNTER — Observation Stay (HOSPITAL_COMMUNITY)
Admission: EM | Admit: 2010-09-14 | Discharge: 2010-09-17 | Disposition: A | Discharge status: Home / Self Care | Payer: PRIVATE HEALTH INSURANCE | Attending: Internal Medicine | Admitting: Internal Medicine

## 2010-09-14 DIAGNOSIS — I10 Essential (primary) hypertension: Secondary | ICD-10-CM | POA: Insufficient documentation

## 2010-09-14 DIAGNOSIS — Z794 Long term (current) use of insulin: Secondary | ICD-10-CM | POA: Insufficient documentation

## 2010-09-14 DIAGNOSIS — Z9981 Dependence on supplemental oxygen: Secondary | ICD-10-CM | POA: Insufficient documentation

## 2010-09-14 DIAGNOSIS — E119 Type 2 diabetes mellitus without complications: Secondary | ICD-10-CM | POA: Insufficient documentation

## 2010-09-14 DIAGNOSIS — R0789 Other chest pain: Principal | ICD-10-CM | POA: Insufficient documentation

## 2010-09-14 DIAGNOSIS — R109 Unspecified abdominal pain: Secondary | ICD-10-CM | POA: Insufficient documentation

## 2010-09-14 DIAGNOSIS — Z96649 Presence of unspecified artificial hip joint: Secondary | ICD-10-CM | POA: Insufficient documentation

## 2010-09-14 DIAGNOSIS — F17200 Nicotine dependence, unspecified, uncomplicated: Secondary | ICD-10-CM | POA: Insufficient documentation

## 2010-09-14 DIAGNOSIS — M25519 Pain in unspecified shoulder: Secondary | ICD-10-CM | POA: Insufficient documentation

## 2010-09-14 DIAGNOSIS — Z96659 Presence of unspecified artificial knee joint: Secondary | ICD-10-CM | POA: Insufficient documentation

## 2010-09-14 DIAGNOSIS — R9389 Abnormal findings on diagnostic imaging of other specified body structures: Secondary | ICD-10-CM | POA: Insufficient documentation

## 2010-09-14 DIAGNOSIS — R1011 Right upper quadrant pain: Secondary | ICD-10-CM | POA: Insufficient documentation

## 2010-09-14 DIAGNOSIS — F341 Dysthymic disorder: Secondary | ICD-10-CM | POA: Insufficient documentation

## 2010-09-14 DIAGNOSIS — Z79899 Other long term (current) drug therapy: Secondary | ICD-10-CM | POA: Insufficient documentation

## 2010-09-14 DIAGNOSIS — R9431 Abnormal electrocardiogram [ECG] [EKG]: Secondary | ICD-10-CM | POA: Insufficient documentation

## 2010-09-14 HISTORY — DX: Essential (primary) hypertension: I10

## 2010-09-14 LAB — DIFFERENTIAL
Basophils Absolute: 0 10*3/uL (ref 0.0–0.1)
Basophils Relative: 0 % (ref 0–1)
Eosinophils Absolute: 0.2 10*3/uL (ref 0.0–0.7)
Eosinophils Relative: 2 % (ref 0–5)
Lymphocytes Relative: 34 % (ref 12–46)
Lymphs Abs: 3.3 10*3/uL (ref 0.7–4.0)
Monocytes Absolute: 0.6 10*3/uL (ref 0.1–1.0)
Monocytes Relative: 6 % (ref 3–12)
Neutro Abs: 5.7 10*3/uL (ref 1.7–7.7)
Neutrophils Relative %: 58 % (ref 43–77)

## 2010-09-14 LAB — POCT I-STAT TROPONIN I: Troponin i, poc: 0.01 ng/mL (ref 0.00–0.08)

## 2010-09-14 LAB — POCT I-STAT, CHEM 8
BUN: 11 mg/dL (ref 6–23)
Calcium, Ion: 1.19 mmol/L (ref 1.12–1.32)
Chloride: 100 mEq/L (ref 96–112)
Creatinine, Ser: 0.6 mg/dL (ref 0.50–1.10)
Glucose, Bld: 256 mg/dL — ABNORMAL HIGH (ref 70–99)
HCT: 41 % (ref 36.0–46.0)
Hemoglobin: 13.9 g/dL (ref 12.0–15.0)
Potassium: 3.4 mEq/L — ABNORMAL LOW (ref 3.5–5.1)
Sodium: 138 mEq/L (ref 135–145)
TCO2: 27 mmol/L (ref 0–100)

## 2010-09-14 LAB — CBC
HCT: 37 % (ref 36.0–46.0)
Hemoglobin: 13.4 g/dL (ref 12.0–15.0)
MCH: 31.3 pg (ref 26.0–34.0)
MCHC: 36.2 g/dL — ABNORMAL HIGH (ref 30.0–36.0)
MCV: 86.4 fL (ref 78.0–100.0)
Platelets: 397 10*3/uL (ref 150–400)
RBC: 4.28 MIL/uL (ref 3.87–5.11)
RDW: 12.1 % (ref 11.5–15.5)
WBC: 9.9 10*3/uL (ref 4.0–10.5)

## 2010-09-14 LAB — CK TOTAL AND CKMB (NOT AT ARMC)
CK, MB: 2 ng/mL (ref 0.3–4.0)
Relative Index: INVALID (ref 0.0–2.5)
Total CK: 80 U/L (ref 7–177)

## 2010-09-14 LAB — TROPONIN I: Troponin I: <0.3 ng/mL (ref <0.30)

## 2010-09-14 LAB — GLUCOSE, CAPILLARY: Glucose-Capillary: 209 mg/dL — ABNORMAL HIGH (ref 70–99)

## 2010-09-15 ENCOUNTER — Observation Stay (HOSPITAL_COMMUNITY): Payer: PRIVATE HEALTH INSURANCE

## 2010-09-15 LAB — LIPID PANEL
Cholesterol: 149 mg/dL (ref 0–200)
HDL: 35 mg/dL — ABNORMAL LOW (ref >39)
LDL Cholesterol: 90 mg/dL (ref 0–99)
Total CHOL/HDL Ratio: 4.3 RATIO
Triglycerides: 121 mg/dL (ref <150)
VLDL: 24 mg/dL (ref 0–40)

## 2010-09-15 LAB — COMPREHENSIVE METABOLIC PANEL
ALT: 9 U/L (ref 0–35)
AST: 12 U/L (ref 0–37)
Albumin: 3.6 g/dL (ref 3.5–5.2)
Alkaline Phosphatase: 99 U/L (ref 39–117)
BUN: 12 mg/dL (ref 6–23)
CO2: 33 mEq/L — ABNORMAL HIGH (ref 19–32)
Calcium: 10.4 mg/dL (ref 8.4–10.5)
Chloride: 97 mEq/L (ref 96–112)
Creatinine, Ser: 0.58 mg/dL (ref 0.50–1.10)
GFR calc Af Amer: >60 mL/min (ref >60)
GFR calc non Af Amer: >60 mL/min (ref >60)
Glucose, Bld: 109 mg/dL — ABNORMAL HIGH (ref 70–99)
Potassium: 4 mEq/L (ref 3.5–5.1)
Sodium: 137 mEq/L (ref 135–145)
Total Bilirubin: 0.2 mg/dL — ABNORMAL LOW (ref 0.3–1.2)
Total Protein: 7.2 g/dL (ref 6.0–8.3)

## 2010-09-15 LAB — CK TOTAL AND CKMB (NOT AT ARMC)
CK, MB: 1.5 ng/mL (ref 0.3–4.0)
Relative Index: INVALID (ref 0.0–2.5)
Total CK: 69 U/L (ref 7–177)

## 2010-09-15 LAB — RAPID URINE DRUG SCREEN, HOSP PERFORMED
Amphetamines: NOT DETECTED
Barbiturates: NOT DETECTED
Benzodiazepines: NOT DETECTED
Cocaine: NOT DETECTED
Opiates: NOT DETECTED
Tetrahydrocannabinol: NOT DETECTED

## 2010-09-15 LAB — TROPONIN I: Troponin I: <0.3 ng/mL (ref <0.30)

## 2010-09-15 LAB — LIPASE, BLOOD: Lipase: 104 U/L — ABNORMAL HIGH (ref 11–59)

## 2010-09-15 NOTE — H&P (Signed)
[name], [name]                ACCOUNT NO.:  [id number]  MEDICAL RECORD NO.:  [id number]  LOCATION:  WLED                         FACILITY:  [facility]  PHYSICIAN:  Gery Pray, MD      DATE OF BIRTH:  May 11, 1950  DATE OF ADMISSION:  09/14/2010 DATE OF DISCHARGE:                             HISTORY & PHYSICAL   PRIMARY CARE PHYSICIAN:  Rico Junker, MD  CODE STATUS:  Full code.  TEAM#:  5.  CHIEF COMPLAINT:  Right shoulder pain.  HISTORY OF PRESENT ILLNESS:  This is a 60 year old female who states that she has been having right-sided shoulder pain for the last 2 weeks. It is on the shoulder, radiates down into the breast.  She states she has no left-sided chest pain.  She states the pain is worse with movement.  She states she is taking over-the-counter Tylenol, ibuprofen and other pain medications without effect.  She does have a history of arthritis, for which she takes hydrocortisone.  She reports that she does have a great grandchild who she has been lifting quite a bit recently.  She reports positive mild palpitations, positive mild lower extremity edema.  She reports diaphoresis, but this is chronic, associated with perimenopausal symptoms.  She has no history of cardiac disease.  She reports she has been coughing quite a bit, nonproductive. She has rattling in her chest.  She has some mild shortness of breath. No diarrhea.  The patient states that she was admitted here 2 months ago for chest pains associated with shortness of breath.  I am unable to find any records of this.  She states she also had a stress test.  I am also unable to find any records of this.  I had the nurses pull up the patient's outpatient records and there are no records of an outpatient stress test. The patient's most recent admission here was in 2011 for chest pains.  History obtained from the patient, I am not sure if she is a reliable historian.  The hospitalist service was asked to  admit the patient because of new EKG changes.  REVIEW OF SYSTEMS:  All 10-point systems reviewed, negative except as noted in HPI.  PAST MEDICAL HISTORY: 1. Hypertension. 2. Severe anxiety (obvious during the interview). 3. Depression. 4. Diabetes mellitus. 5. Insomnia.  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  MEDICATIONS:  Lantus, lisinopril, hydrochlorothiazide, metformin, and glipizide.  ALLERGIES:  The patient states she is allergic to some medication, although she is unable to remember the medication.  She states it causes severe GI upset.  SOCIAL HISTORY:  Positive for tobacco, negative for alcohol, illicit drugs.  She is on home oxygen.  She uses a 4-prong walker.  FAMILY HISTORY:  Significant for the coronary artery disease and diabetes mellitus.  PHYSICAL EXAMINATION:  VITAL SIGNS:  Blood pressure 177/106, pulse 92, respirations 20, temperature 98.5, satting 100% on room air. GENERAL:  Alert, oriented female, anxious. EYES:  Pink conjunctivae.  PERLA. ENT:  Moist oral mucosa.  Trachea midline. NECK:  Supple.  No thyromegaly. LUNGS:  Clear to auscultation bilaterally.  No wheezing appreciated.  No use of  accessory muscles. CARDIOVASCULAR:  Regular rate and rhythm without murmurs, rigors, or gallops.  No JVD.  No carotid bruits. ABDOMEN:  Soft, positive bowel sounds, nontender, nondistended.  I am unable to assess organomegaly due to body habitus. NEUROLOGIC:  Cranial nerves II through XII grossly intact.  Sensation intact.   MUSCULOSKELETAL:  Strength 5/5 in all extremities.  No clubbing, cyanosis, or edema.  The patient has reproducible right shoulder tenderness with range of motion and with palpation. SKIN:  No rashes.  No subcutaneous crepitations. PSYCHIATRIC:  Anxious, alert, and oriented.  LABORATORY DATA:  X-ray of the right shoulder no acute right shoulder abnormalities.  Chest x-ray, no acute  abnormality.  White blood count 9.9, hemoglobin 13.4, platelets 394.  Troponin 0.01.  Sodium 138, potassium 3.4, chloride 100, BUN 11, creatinine 0.6.  EKG shows flipped T-waves in all of the lateral leads.  The patient's EKG from 2011 did not show flipped T-waves.  She had sinus rhythm with lots of PVCs.  ASSESSMENT AND PLAN: 1. Chest pains, mostly right sided. 2. Abnormal EKG - new.  The patient will be brought in for 24-hour     observation.  She will be started on pain medications.  A cardiac     workup will also be initiated.  Lipid panel will be ordered.  The     patient will be placed on aspirin.  We will cycle cardiac enzymes.     The patient will be on DVT prophylaxis.  We will add a beta-blocker     for better blood pressure control.  No statin has been started.     The patient does have a cardiac risk factors, diabetes mellitus as     well as high blood pressure.  We will defer to a.m. team for stress     test as this likely will not be able to be done until Monday.  We     will order a urine drug screen. 3. Tobacco abuse.  We will order nicotine patch and tobacco cessation. 4. Diabetes mellitus and hypertension, ADA diet and sliding scale     insulin.  Resume home medications, currently stable.          ______________________________ Gery Pray, MD     DC/MEDQ  D:  09/14/2010  T:  09/14/2010  Job:  [id number]  Electronically Signed by Gery Pray MD on 09/15/2010 02:57:47 AM

## 2010-09-16 ENCOUNTER — Observation Stay (HOSPITAL_COMMUNITY): Payer: PRIVATE HEALTH INSURANCE

## 2010-09-16 ENCOUNTER — Encounter (HOSPITAL_COMMUNITY): Payer: Self-pay

## 2010-09-16 LAB — GLUCOSE, CAPILLARY
Glucose-Capillary: 255 mg/dL — ABNORMAL HIGH (ref 70–99)
Glucose-Capillary: 119 mg/dL — ABNORMAL HIGH (ref 70–99)
Glucose-Capillary: 106 mg/dL — ABNORMAL HIGH (ref 70–99)
Glucose-Capillary: 127 mg/dL — ABNORMAL HIGH (ref 70–99)
Glucose-Capillary: 170 mg/dL — ABNORMAL HIGH (ref 70–99)
Glucose-Capillary: 186 mg/dL — ABNORMAL HIGH (ref 70–99)
Glucose-Capillary: 118 mg/dL — ABNORMAL HIGH (ref 70–99)
Glucose-Capillary: 128 mg/dL — ABNORMAL HIGH (ref 70–99)

## 2010-09-16 LAB — LIPASE, BLOOD: Lipase: 20 U/L (ref 11–59)

## 2010-09-16 MED ORDER — IOHEXOL 300 MG/ML  SOLN
100.0000 mL | Freq: Once | INTRAMUSCULAR | Status: AC | PRN
  Administered 2010-09-16: 100 mL via INTRAVENOUS

## 2010-09-17 LAB — BASIC METABOLIC PANEL
BUN: 10 mg/dL (ref 6–23)
CO2: 31 mEq/L (ref 19–32)
Calcium: 9.9 mg/dL (ref 8.4–10.5)
Chloride: 98 mEq/L (ref 96–112)
Creatinine, Ser: 0.56 mg/dL (ref 0.50–1.10)
GFR calc Af Amer: >60 mL/min (ref >60)
GFR calc non Af Amer: >60 mL/min (ref >60)
Glucose, Bld: 149 mg/dL — ABNORMAL HIGH (ref 70–99)
Potassium: 3.2 mEq/L — ABNORMAL LOW (ref 3.5–5.1)
Sodium: 136 mEq/L (ref 135–145)

## 2010-09-17 LAB — GLUCOSE, CAPILLARY: Glucose-Capillary: 149 mg/dL — ABNORMAL HIGH (ref 70–99)

## 2010-09-24 NOTE — Discharge Summary (Signed)
NAMESHANLEY, FURLOUGH                ACCOUNT NO.:  [id number]  MEDICAL RECORD NO.:  [id number]  LOCATION:  1438                         FACILITY:  [facility]  PHYSICIAN:  Hartley Barefoot, MD    DATE OF BIRTH:  1950-06-05  DATE OF ADMISSION:  09/14/2010 DATE OF DISCHARGE:  09/16/2010                              DISCHARGE SUMMARY   DISCHARGE DIAGNOSES: 1. Chest pain, musculoskeletal pain. 2. Malignant hypertension. 3. Right upper quadrant pain, resolved with negative right upper     quadrant ultrasound. 4. Diabetes. 5. Lower quadrant abdominal pain, probably gastroenteritis. 6. Irregularity of the upper mid pole of the right kidney per     ultrasound.  PAST MEDICAL HISTORY: 1. Hypertension. 2. Anxiety. 3. Depression. 4. Diabetes. 5. Insomnia.  DISCHARGE MEDICATIONS: 1. Atenolol 50 mg p.o. daily. 2. Glimepiride 2 mg p.o. daily. 3. Hydrochlorothiazide 25 p.o. daily. 4. Hydrocodone 10/500 1-2 tablets by mouth every 4-6 hours as needed. 5. Lantus 40 units subcu daily at bedtime. 6. Lisinopril 40 mg tablet by mouth daily. 7. Metformin 1 tablet p.o. b.i.d. 8. Prempro 1 tablet by mouth daily.  RADIOGRAPHIC STUDIES: 1. CT abdomen and pelvis pending. 2. Ultrasound showed no gallstones, gallbladder wall thickening, or     pericholecystic fluid.  The common bile measured 0.6 cm.  Pancreas,     no focal abnormality.  Spleen 4.7 cm.  Right kidney, no evidence     for hydronephrosis.  There is irregularity of the upper mid pole of     the right kidney. 3. X-ray of the shoulder, AC joint alignment normal, no acute right     shoulder abnormality. 4. Chest x-ray, no acute abnormality.  BRIEF HISTORY OF PRESENT ILLNESS:  This is a very pleasant 60 year old, who presents complaining of right-sided shoulder pain and right-sided chest pain, also shoulder blade pain that started 2 weeks ago.  Her pain was getting worse, for this reason, she presented to the emergency department.  The patient  on examination had right upper quadrant abdominal tenderness.  Subsequently, on her second day of hospitalization, she started to complain of bilateral lower quadrant abdominal pain, 2 or 3 three episodes of bowel movement.  The patient relayed that she has been having this pain for the last 3 week's also, and she has 2 or 3 bowel movements per day that are normal.  She denies dysuria.  HOSPITAL COURSE: 1. Right-sided chest pain, shoulder blade pain, and shoulder pain.     This was likely musculoskeletal pain.  Her pain has resolved at     this time.  Cardiac enzymes x3 negative.  The patient was admitted     to rule out acute coronary syndrome because she has some ST     depression in an EKG done in the emergency department during this     admission.  In reviewing records, she had some T-wave abnormality     inversion on an EKG in November 2011. 2. Malignant hypertension.  The patient presents with a systolic blood     pressure of 177/106.  After restarting her home medication, her     blood pressure has been in the 130s range.  Of note,  the patient     will have a CT abdomen today, I am going to hold her lisinopril for     today.  She will need to follow with her primary care physician     within a week to have a kidney function check. 3. Right upper quadrant pain, resolved.  The patient had normal liver     function test.  She has had mild elevation of lipase at 100, I do     not think that she had pancreatitis.  She was not having any pain     with food or any nausea or vomiting.  Repeated lipase was at 20. 4. Diabetes.  We would continue with her same regimen.  No changes     during this hospitalization. 5. Abdominal pain, lower quadrant, and 3 loose stools.  The patient     has been having this problem for the last 3 weeks, I think this     could be some gastroenteritis, her pain is better.  The CT abdomen     and pelvis is pending at this time.  If that is normal, the patient      wants to go home tonight.  It was a pleasure of take care of Ms. Bordeaux.     Hartley Barefoot, MD     BR/MEDQ  D:  09/16/2010  T:  09/17/2010  Job:  [id number]  cc:   Lonia Blood, M.D.  Electronically Signed by Hartley Barefoot MD on 09/24/2010 10:20:05 AM

## 2010-10-18 LAB — COMPREHENSIVE METABOLIC PANEL
ALT: 17
AST: 18
Albumin: 3.9
Alkaline Phosphatase: 124 — ABNORMAL HIGH
BUN: 6
CO2: 28
Calcium: 9.7
Chloride: 96
Creatinine, Ser: 0.71
GFR calc Af Amer: >60
GFR calc non Af Amer: >60
Glucose, Bld: 509
Potassium: 3.7
Sodium: 135
Total Bilirubin: 0.9
Total Protein: 7.4

## 2010-10-18 LAB — CBC
HCT: 40.6
Hemoglobin: 14
MCHC: 34.6
MCV: 90.3
Platelets: 475 — ABNORMAL HIGH
RBC: 4.49
RDW: 12.5
WBC: 8.4

## 2010-10-18 LAB — URINALYSIS, ROUTINE W REFLEX MICROSCOPIC
Bilirubin Urine: NEGATIVE
Glucose, UA: >1000 — AB
Hgb urine dipstick: NEGATIVE
Leukocytes, UA: NEGATIVE
Nitrite: NEGATIVE
Protein, ur: NEGATIVE
Specific Gravity, Urine: 1.044 — ABNORMAL HIGH
Urobilinogen, UA: 0.2
pH: 6.5

## 2010-10-18 LAB — POCT CARDIAC MARKERS
CKMB, poc: <1 — ABNORMAL LOW
Myoglobin, poc: 73.2
Operator id: 4708
Troponin i, poc: <0.05

## 2010-10-18 LAB — DIFFERENTIAL
Basophils Absolute: 0
Basophils Relative: 0
Eosinophils Absolute: 0.1
Eosinophils Relative: 1
Lymphocytes Relative: 24
Lymphs Abs: 2
Monocytes Absolute: 0.5
Monocytes Relative: 6
Neutro Abs: 5.7
Neutrophils Relative %: 68

## 2010-10-18 LAB — URINE MICROSCOPIC-ADD ON

## 2010-10-21 LAB — RAPID URINE DRUG SCREEN, HOSP PERFORMED
Amphetamines: NOT DETECTED
Barbiturates: NOT DETECTED
Benzodiazepines: NOT DETECTED
Cocaine: NOT DETECTED
Opiates: NOT DETECTED
Tetrahydrocannabinol: NOT DETECTED

## 2010-10-21 LAB — COMPREHENSIVE METABOLIC PANEL
ALT: 19
AST: 29
Albumin: 3.5
Alkaline Phosphatase: 89
BUN: 9
CO2: 31
Calcium: 9.2
Chloride: 93 — ABNORMAL LOW
Creatinine, Ser: 0.72
GFR calc Af Amer: >60
GFR calc non Af Amer: >60
Glucose, Bld: 299 — ABNORMAL HIGH
Potassium: 4
Sodium: 131 — ABNORMAL LOW
Total Bilirubin: 0.5
Total Protein: 6.9

## 2010-10-21 LAB — POCT I-STAT 3, VENOUS BLOOD GAS (G3P V)
Acid-Base Excess: 3 — ABNORMAL HIGH
Acid-base deficit: 2
Bicarbonate: 24.5 — ABNORMAL HIGH
Bicarbonate: 24.8 — ABNORMAL HIGH
Bicarbonate: 28.4 — ABNORMAL HIGH
O2 Saturation: 62
O2 Saturation: 68
O2 Saturation: 70
Operator id: 274661
Operator id: 274661
Operator id: 274661
TCO2: 26
TCO2: 26
TCO2: 30
pCO2, Ven: 42 — ABNORMAL LOW
pCO2, Ven: 45.8
pCO2, Ven: 46.4
pH, Ven: 7.336 — ABNORMAL HIGH
pH, Ven: 7.379 — ABNORMAL HIGH
pH, Ven: 7.394 — ABNORMAL HIGH
pO2, Ven: 34
pO2, Ven: 36
pO2, Ven: 37

## 2010-10-21 LAB — CBC
HCT: 35.2 — ABNORMAL LOW
HCT: 39.3
Hemoglobin: 12.1
Hemoglobin: 13.5
MCHC: 34.3
MCHC: 34.5
MCV: 91
MCV: 91.6
Platelets: 343
Platelets: 395
RBC: 3.86 — ABNORMAL LOW
RBC: 4.29
RDW: 12.1
RDW: 12.4
WBC: 7.4
WBC: 8.3

## 2010-10-21 LAB — LIPID PANEL
Cholesterol: 141
HDL: 27 — ABNORMAL LOW
LDL Cholesterol: 87
Total CHOL/HDL Ratio: 5.2
Triglycerides: 136
VLDL: 27

## 2010-10-21 LAB — BLOOD GAS, ARTERIAL
Acid-Base Excess: 4.2 — ABNORMAL HIGH
Bicarbonate: 29 — ABNORMAL HIGH
FIO2: 0.21
O2 Saturation: 91.9
Patient temperature: 98.6
TCO2: 30.5
pCO2 arterial: 49.9 — ABNORMAL HIGH
pH, Arterial: 7.382
pO2, Arterial: 65 — ABNORMAL LOW

## 2010-10-21 LAB — BASIC METABOLIC PANEL
BUN: 10
BUN: 12
BUN: 6
BUN: 9
CO2: 29
CO2: 30
CO2: 32
CO2: 32
Calcium: 9.1
Calcium: 9.2
Calcium: 9.2
Calcium: 9.4
Chloride: 100
Chloride: 91 — ABNORMAL LOW
Chloride: 96
Chloride: 99
Creatinine, Ser: 0.63
Creatinine, Ser: 0.67
Creatinine, Ser: 0.69
Creatinine, Ser: 0.77
GFR calc Af Amer: >60
GFR calc Af Amer: >60
GFR calc Af Amer: >60
GFR calc Af Amer: >60
GFR calc non Af Amer: >60
GFR calc non Af Amer: >60
GFR calc non Af Amer: >60
GFR calc non Af Amer: >60
Glucose, Bld: 279 — ABNORMAL HIGH
Glucose, Bld: 299 — ABNORMAL HIGH
Glucose, Bld: 301 — ABNORMAL HIGH
Glucose, Bld: 344 — ABNORMAL HIGH
Potassium: 3.3 — ABNORMAL LOW
Potassium: 3.9
Potassium: 4.1
Potassium: 4.2
Sodium: 131 — ABNORMAL LOW
Sodium: 133 — ABNORMAL LOW
Sodium: 137
Sodium: 138

## 2010-10-21 LAB — TROPONIN I: Troponin I: <0.01

## 2010-10-21 LAB — LIPASE, BLOOD: Lipase: 32

## 2010-10-21 LAB — CARDIAC PANEL(CRET KIN+CKTOT+MB+TROPI)
CK, MB: 0.9
CK, MB: 0.9
CK, MB: 0.9
Relative Index: INVALID
Relative Index: INVALID
Relative Index: INVALID
Total CK: 60
Total CK: 64
Total CK: 66
Troponin I: 0.01
Troponin I: <0.01
Troponin I: <0.01

## 2010-10-21 LAB — I-STAT 8, (EC8 V) (CONVERTED LAB)
Acid-Base Excess: 2
BUN: 6
Bicarbonate: 25.3 — ABNORMAL HIGH
Chloride: 101
Glucose, Bld: 434 — ABNORMAL HIGH
HCT: 42
Hemoglobin: 14.3
Operator id: 294501
Potassium: 3.8
Sodium: 134 — ABNORMAL LOW
TCO2: 26
pCO2, Ven: 34.6 — ABNORMAL LOW
pH, Ven: 7.472 — ABNORMAL HIGH

## 2010-10-21 LAB — URINALYSIS, ROUTINE W REFLEX MICROSCOPIC
Bilirubin Urine: NEGATIVE
Glucose, UA: >1000 — AB
Ketones, ur: 15 — AB
Leukocytes, UA: NEGATIVE
Nitrite: NEGATIVE
Protein, ur: NEGATIVE
Specific Gravity, Urine: 1.041 — ABNORMAL HIGH
Urobilinogen, UA: 0.2
pH: 5.5

## 2010-10-21 LAB — URINE MICROSCOPIC-ADD ON

## 2010-10-21 LAB — POCT CARDIAC MARKERS
CKMB, poc: <1 — ABNORMAL LOW
Myoglobin, poc: 33.7
Operator id: 294501
Troponin i, poc: <0.05

## 2010-10-21 LAB — HEMOGLOBIN A1C
Hgb A1c MFr Bld: 14.4 — ABNORMAL HIGH
Mean Plasma Glucose: 435

## 2010-10-21 LAB — POCT I-STAT CREATININE
Creatinine, Ser: 0.7
Operator id: 294501

## 2010-10-21 LAB — B-NATRIURETIC PEPTIDE (CONVERTED LAB)
Pro B Natriuretic peptide (BNP): <30
Pro B Natriuretic peptide (BNP): <30

## 2010-10-21 LAB — CK TOTAL AND CKMB (NOT AT ARMC)
CK, MB: 1
Relative Index: INVALID
Total CK: 75

## 2010-10-21 LAB — TSH: TSH: 1.862

## 2010-10-22 LAB — POCT I-STAT, CHEM 8
BUN: 8
Calcium, Ion: 1.17
Chloride: 103
Creatinine, Ser: 0.9
Glucose, Bld: 126 — ABNORMAL HIGH
HCT: 34 — ABNORMAL LOW
Hemoglobin: 11.6 — ABNORMAL LOW
Potassium: 3.7
Sodium: 143
TCO2: 29

## 2010-10-22 LAB — D-DIMER, QUANTITATIVE: D-Dimer, Quant: 0.38

## 2010-10-25 LAB — URINE MICROSCOPIC-ADD ON

## 2010-10-25 LAB — CBC
HCT: 36.8
Hemoglobin: 12.6
MCHC: 34.3
MCV: 94.3
Platelets: 457 — ABNORMAL HIGH
RBC: 3.9
RDW: 12.6
WBC: 10.1

## 2010-10-25 LAB — URINALYSIS, ROUTINE W REFLEX MICROSCOPIC
Bilirubin Urine: NEGATIVE
Glucose, UA: NEGATIVE
Hgb urine dipstick: NEGATIVE
Ketones, ur: NEGATIVE
Nitrite: NEGATIVE
Protein, ur: NEGATIVE
Specific Gravity, Urine: 1.016
Urobilinogen, UA: 1
pH: 7

## 2010-10-25 LAB — PROTIME-INR
INR: 0.9
Prothrombin Time: 12.6

## 2010-10-25 LAB — COMPREHENSIVE METABOLIC PANEL
ALT: 16
AST: 18
Albumin: 4.1
Alkaline Phosphatase: 83
BUN: 15
CO2: 29
Calcium: 10.2
Chloride: 102
Creatinine, Ser: 0.77
GFR calc Af Amer: >60
GFR calc non Af Amer: >60
Glucose, Bld: 131 — ABNORMAL HIGH
Potassium: 4.8
Sodium: 139
Total Bilirubin: 0.5
Total Protein: 7.5

## 2010-10-25 LAB — DIFFERENTIAL
Basophils Absolute: 0
Basophils Relative: 0
Eosinophils Absolute: 0.2
Eosinophils Relative: 2
Lymphocytes Relative: 30
Lymphs Abs: 3
Monocytes Absolute: 0.5
Monocytes Relative: 5
Neutro Abs: 6.2
Neutrophils Relative %: 62

## 2010-10-25 LAB — TYPE AND SCREEN
ABO/RH(D): A POS
Antibody Screen: NEGATIVE

## 2010-10-25 LAB — APTT: aPTT: 27

## 2010-11-01 LAB — POCT I-STAT, CHEM 8
BUN: 15 mg/dL (ref 6–23)
Calcium, Ion: 1.23 mmol/L (ref 1.12–1.32)
Chloride: 101 mEq/L (ref 96–112)
Creatinine, Ser: 0.9 mg/dL (ref 0.4–1.2)
Glucose, Bld: 165 mg/dL — ABNORMAL HIGH (ref 70–99)
HCT: 37 % (ref 36.0–46.0)
Hemoglobin: 12.6 g/dL (ref 12.0–15.0)
Potassium: 3.7 mEq/L (ref 3.5–5.1)
Sodium: 142 mEq/L (ref 135–145)
TCO2: 31 mmol/L (ref 0–100)

## 2010-11-01 LAB — D-DIMER, QUANTITATIVE: D-Dimer, Quant: 0.71 ug/mL-FEU — ABNORMAL HIGH (ref 0.00–0.48)

## 2010-11-08 LAB — POCT CARDIAC MARKERS
CKMB, poc: <1 — ABNORMAL LOW
Myoglobin, poc: 38.8
Operator id: 277751
Troponin i, poc: <0.05

## 2010-11-08 LAB — DIFFERENTIAL
Basophils Absolute: 0.1
Basophils Relative: 1
Eosinophils Absolute: 0.2
Eosinophils Relative: 2
Lymphocytes Relative: 30
Lymphs Abs: 2.7
Monocytes Absolute: 0.6
Monocytes Relative: 6
Neutro Abs: 5.3
Neutrophils Relative %: 60

## 2010-11-08 LAB — CBC
HCT: 35.3 — ABNORMAL LOW
Hemoglobin: 12.3
MCHC: 34.7
MCV: 90.8
Platelets: 401 — ABNORMAL HIGH
RBC: 3.89
RDW: 13
WBC: 8.9

## 2010-11-08 LAB — I-STAT 8, (EC8 V) (CONVERTED LAB)
Acid-Base Excess: 4 — ABNORMAL HIGH
BUN: 10
Bicarbonate: 27.9 — ABNORMAL HIGH
Chloride: 105
Glucose, Bld: 126 — ABNORMAL HIGH
HCT: 39
Hemoglobin: 13.3
Operator id: 277751
Potassium: 3.7
Sodium: 140
TCO2: 29
pCO2, Ven: 37.2 — ABNORMAL LOW
pH, Ven: 7.483 — ABNORMAL HIGH

## 2010-11-08 LAB — POCT I-STAT CREATININE
Creatinine, Ser: 0.7
Operator id: 277751

## 2011-02-16 ENCOUNTER — Encounter (HOSPITAL_COMMUNITY): Payer: Self-pay | Admitting: *Deleted

## 2011-02-16 ENCOUNTER — Emergency Department (HOSPITAL_COMMUNITY): Payer: PRIVATE HEALTH INSURANCE

## 2011-02-16 ENCOUNTER — Emergency Department (HOSPITAL_COMMUNITY)
Admission: EM | Admit: 2011-02-16 | Discharge: 2011-02-16 | Disposition: A | Discharge status: Home / Self Care | Payer: PRIVATE HEALTH INSURANCE | Attending: Emergency Medicine | Admitting: Emergency Medicine

## 2011-02-16 DIAGNOSIS — J029 Acute pharyngitis, unspecified: Secondary | ICD-10-CM | POA: Insufficient documentation

## 2011-02-16 DIAGNOSIS — IMO0001 Reserved for inherently not codable concepts without codable children: Secondary | ICD-10-CM | POA: Insufficient documentation

## 2011-02-16 DIAGNOSIS — R059 Cough, unspecified: Secondary | ICD-10-CM | POA: Insufficient documentation

## 2011-02-16 DIAGNOSIS — R6889 Other general symptoms and signs: Secondary | ICD-10-CM | POA: Insufficient documentation

## 2011-02-16 DIAGNOSIS — R05 Cough: Secondary | ICD-10-CM | POA: Insufficient documentation

## 2011-02-16 DIAGNOSIS — R079 Chest pain, unspecified: Secondary | ICD-10-CM | POA: Insufficient documentation

## 2011-02-16 DIAGNOSIS — R509 Fever, unspecified: Secondary | ICD-10-CM | POA: Insufficient documentation

## 2011-02-16 DIAGNOSIS — I10 Essential (primary) hypertension: Secondary | ICD-10-CM | POA: Insufficient documentation

## 2011-02-16 DIAGNOSIS — R0602 Shortness of breath: Secondary | ICD-10-CM | POA: Insufficient documentation

## 2011-02-16 LAB — RAPID STREP SCREEN (MED CTR MEBANE ONLY): Streptococcus, Group A Screen (Direct): NEGATIVE

## 2011-02-16 MED ORDER — IPRATROPIUM BROMIDE 0.02 % IN SOLN
0.5000 mg | RESPIRATORY_TRACT | Status: AC
  Administered 2011-02-16: 0.5 mg via RESPIRATORY_TRACT
  Filled 2011-02-16: qty 2.5

## 2011-02-16 MED ORDER — HYDROCODONE-HOMATROPINE 5-1.5 MG/5ML PO SYRP: 5.0000 mL | ORAL_SOLUTION | Freq: Four times a day (QID) | ORAL | Status: AC | PRN

## 2011-02-16 MED ORDER — ALBUTEROL SULFATE (5 MG/ML) 0.5% IN NEBU
INHALATION_SOLUTION | RESPIRATORY_TRACT | Status: AC
  Filled 2011-02-16: qty 0.5

## 2011-02-16 MED ORDER — ALBUTEROL SULFATE (5 MG/ML) 0.5% IN NEBU
5.0000 mg | INHALATION_SOLUTION | Freq: Once | RESPIRATORY_TRACT | Status: AC
  Administered 2011-02-16: 5 mg via RESPIRATORY_TRACT
  Filled 2011-02-16: qty 0.5

## 2011-02-16 NOTE — ED Notes (Signed)
Sudden onset throat pain

## 2011-02-16 NOTE — ED Provider Notes (Signed)
History     CSN: [id number]  Arrival date & time 02/16/11  0945   First MD Initiated Contact with Patient 02/16/11 1007      Chief Complaint  Patient presents with  . Sore Throat    (Consider location/radiation/quality/duration/timing/severity/associated sxs/prior treatment) HPI Comments: Pt presents to the ED with complaints of flu-like symptoms of cough, congestion, sore throat, muscle aches, chills, fevers & chest tightness. Pt denies ear pain, headaches, abdominal pain, vomiting, diarrhea. The patient states that the symptoms started 2 days ago. Pt states that she has been short of breath today.  Pt has been around other sick contacts and did not get the flu shot this year. The patient denies  neck pain, weakness, vision changes, severe abdominal pain, inability to eat or drink, difficulty breathing, wheezing, chest pain. The patient has tried cough medicine, NSAIDS, and rest but has only felt mild relief. Pt has hx of DM, smoking, HTN, & bronchitis.    Patient is a 61 y.o. female presenting with pharyngitis.  Sore Throat This is a new problem. The current episode started in the past 7 days. The problem occurs constantly. The problem has been unchanged. Associated symptoms include chills, coughing, fatigue, a fever, myalgias and a sore throat. Pertinent negatives include no abdominal pain, anorexia, arthralgias, change in bowel habit, chest pain, congestion, diaphoresis, headaches, joint swelling, nausea, neck pain, numbness, rash, swollen glands, urinary symptoms, vertigo, visual change, vomiting or weakness.    Past Medical History  Diagnosis Date  . Diabetes mellitus   . Hypertension     Past Surgical History  Procedure Date  . Joint replacement     Bilateral hip and right knee    No family history on file.  History  Substance Use Topics  . Smoking status: Not on file  . Smokeless tobacco: Not on file  . Alcohol Use:     OB History    Grav Para Term Preterm  Abortions TAB SAB Ect Mult Living                  Review of Systems  Constitutional: Positive for fever, chills and fatigue. Negative for diaphoresis.  HENT: Positive for sore throat. Negative for congestion and neck pain.   Respiratory: Positive for cough.   Cardiovascular: Negative for chest pain.  Gastrointestinal: Negative for nausea, vomiting, abdominal pain, anorexia and change in bowel habit.  Musculoskeletal: Positive for myalgias. Negative for joint swelling and arthralgias.  Skin: Negative for rash.  Neurological: Negative for vertigo, weakness, numbness and headaches.  All other systems reviewed and are negative.    Allergies  Review of patient's allergies indicates no known allergies.  Home Medications  No current outpatient prescriptions on file.  BP 160/88  Pulse 87  Temp(Src) 99.1 F (37.3 C) (Oral)  Resp 20  SpO2 98%  Physical Exam  Vitals reviewed. Constitutional: She is oriented to person, place, and time. She appears well-developed and well-nourished. No distress.  HENT:  Head: Normocephalic and atraumatic. No trismus in the jaw.  Right Ear: External ear normal. No drainage or tenderness. No mastoid tenderness.  Left Ear: External ear normal. No drainage or tenderness. No mastoid tenderness.  Nose: Nose normal. No rhinorrhea or sinus tenderness.  Mouth/Throat: Uvula is midline, oropharynx is clear and moist and mucous membranes are normal. No uvula swelling. No oropharyngeal exudate.  Eyes: Conjunctivae and EOM are normal. Right eye exhibits no discharge. Left eye exhibits no discharge. No scleral icterus.  Neck: Normal range of  motion. Neck supple.  Cardiovascular: Normal rate, regular rhythm and normal heart sounds.   Pulmonary/Chest: Effort normal and breath sounds normal. No stridor. No respiratory distress. She has no wheezes. She exhibits tenderness.  Abdominal: Soft. There is no tenderness.  Musculoskeletal: Normal range of motion.    Neurological: She is alert and oriented to person, place, and time.  Skin: Skin is warm and dry. No rash noted. She is not diaphoretic.  Psychiatric: She has a normal mood and affect. Her behavior is normal.    ED Course  Procedures (including critical care time)   Labs Reviewed  RAPID STREP SCREEN   No results found.   No diagnosis found.    MDM  Flu like symptoms Hypertension Patient with symptoms consistent with influenza.  Vitals are stable, low-grade fever.  No signs of dehydration, tolerating PO's.  Lungs are clear. Discussed the cost versus benefit of Tamiflu treatment with the patient.  The patient understands that symptoms are greater than the recommended 24-48 hour window of treatment.  Patient will be discharged with instructions to orally hydrate, rest, and use over-the-counter medications such as anti-inflammatories ibuprofen and Aleve for muscle aches and Tylenol for fever.  Patient will also be given a cough suppressant.         Jaci Carrel, [place] 02/16/11 1130

## 2011-02-16 NOTE — Discharge Instructions (Signed)
Take medications as prescribed. Followup with your doctor in regards to your hospital visit. If you do not have a doctor use the resource guide listed below to help he find one. You may return to the emergency department if symptoms worsen, become progressive, or become more concerning. Read below to learn more about your diagnosis & reasons to return. Use Tylenol to treat your fever. Use cough syrup/pain medication as prescribed & do not operate any heavy machinery while taking this medication.  Influenza, Adult  Influenza (flu)  starts suddenly, usually with a fever. It causes chills, dry and hacking cough, headache, body aches, and sore throat. Influenza spreads easily from one person to another.  HOME CARE  Only take medicines as told by your doctor.  Rest.  Drink enough fluids to keep your pee (urine) clear or pale yellow.  Wash your hands often. Do this after you blow your nose, after you go to the bathroom, and before you touch food.  GET HELP RIGHT AWAY IF:  You have shortness of breath while resting.  You have pain or pressure in the chest or belly (abdomen).  You suddenly feel dizzy.  You feel confused.  You have a hard time breathing.  Your skin or nails turn bluish in color.  You get a bad neck pain or stiffness.  You get a bad headache, face pain, or earache.  You throw up (vomit) a lot and often.  You have a fever > 101 that persists  MAKE SURE YOU:  Understand these instructions.  Will watch your condition.  Will get help right away if you are not doing well or get worse.    RESOURCE GUIDE  Dental Problems  Patients with Medicaid: [facility] [phone] [address]                                           [phone] [address] Phone:  [phone]                                                  Phone:  [phone]  If unable to pay or uninsured, contact:  [facility] or [facility]. to become qualified for  the adult dental clinic.  Chronic Pain Problems Contact [facility]  [phone] Patients need to be referred by their primary care doctor.  Insufficient Money for Medicine Contact [employer]:  call "211" or [facility] 339-636-4835.  No Primary Care Doctor Call [facility]  367-046-5254 Other agencies that provide inexpensive medical care    [facility]  [phone]    [facility]  [phone]    [facility]  [phone]    [facility]  [phone]    [facility]  989-734-2912    [facility]  [phone]  Psychological Services [facility]  [phone] [facility]  [phone] [facility] Mental Health   [phone] (emergency services ([phone])  Substance Abuse Resources [facility]  5035377702 [facility] 928-704-1221 [facility] ([phone] 084-2689 [facility] 667-417-4211 Residential & Outpatient  Substance Abuse Program  ([phone] 678-9398  Abuse/Neglect [facility] Child Abuse Hotline 937-224-4662 [facility] Child Abuse Hotline (380) 645-3295 (After Hours)  Emergency Shelter [facility] (775) 868-2523  Maternity Homes [facility] [place] [facility] 902-293-4483 [facility] [phone]  MRSA Hotline #:   910-589-5996    [facility] Resources  [facility]     [employer]                          [facility] Dept. [address] [place]                       [address]      [address]  Blondell Reveal Phone:  [phone]                                   Phone:  [phone]                 Phone:  [phone]  [facility] Phone:  ([phone]  [facility] Child Abuse Hotline (215)423-6377 [phone])522-3272 (After Hours)

## 2011-02-16 NOTE — ED Provider Notes (Signed)
Medical screening examination/treatment/procedure(s) were performed by non-physician practitioner and as supervising physician I was immediately available for consultation/collaboration.   Tamica Covell, MD 02/16/11 1513 

## 2011-04-07 ENCOUNTER — Other Ambulatory Visit (HOSPITAL_COMMUNITY): Payer: Self-pay | Admitting: Internal Medicine

## 2011-04-07 DIAGNOSIS — Z1231 Encounter for screening mammogram for malignant neoplasm of breast: Secondary | ICD-10-CM

## 2011-05-01 ENCOUNTER — Ambulatory Visit (HOSPITAL_COMMUNITY): Payer: PRIVATE HEALTH INSURANCE | Attending: Internal Medicine

## 2011-05-06 ENCOUNTER — Ambulatory Visit (HOSPITAL_COMMUNITY): Payer: PRIVATE HEALTH INSURANCE | Attending: Internal Medicine

## 2011-12-12 ENCOUNTER — Emergency Department (HOSPITAL_COMMUNITY): Payer: PRIVATE HEALTH INSURANCE

## 2011-12-12 ENCOUNTER — Emergency Department (HOSPITAL_COMMUNITY)
Admission: EM | Admit: 2011-12-12 | Discharge: 2011-12-13 | Disposition: A | Discharge status: Home / Self Care | Payer: PRIVATE HEALTH INSURANCE | Attending: Emergency Medicine | Admitting: Emergency Medicine

## 2011-12-12 DIAGNOSIS — Z79899 Other long term (current) drug therapy: Secondary | ICD-10-CM | POA: Insufficient documentation

## 2011-12-12 DIAGNOSIS — R05 Cough: Secondary | ICD-10-CM | POA: Insufficient documentation

## 2011-12-12 DIAGNOSIS — E119 Type 2 diabetes mellitus without complications: Secondary | ICD-10-CM | POA: Insufficient documentation

## 2011-12-12 DIAGNOSIS — I10 Essential (primary) hypertension: Secondary | ICD-10-CM | POA: Insufficient documentation

## 2011-12-12 DIAGNOSIS — IMO0001 Reserved for inherently not codable concepts without codable children: Secondary | ICD-10-CM | POA: Insufficient documentation

## 2011-12-12 DIAGNOSIS — J3489 Other specified disorders of nose and nasal sinuses: Secondary | ICD-10-CM | POA: Insufficient documentation

## 2011-12-12 DIAGNOSIS — J020 Streptococcal pharyngitis: Secondary | ICD-10-CM | POA: Insufficient documentation

## 2011-12-12 DIAGNOSIS — Z794 Long term (current) use of insulin: Secondary | ICD-10-CM | POA: Insufficient documentation

## 2011-12-12 DIAGNOSIS — R059 Cough, unspecified: Secondary | ICD-10-CM | POA: Insufficient documentation

## 2011-12-12 LAB — RAPID STREP SCREEN (MED CTR MEBANE ONLY): Streptococcus, Group A Screen (Direct): POSITIVE — AB

## 2011-12-12 NOTE — ED Notes (Signed)
Pt c/o sore throat for 3 days. Pt states she is able to swallow, but it is very painful. Pt has hoarse voice. Pt c/o productive cough with mucous.

## 2011-12-12 NOTE — ED Notes (Signed)
Pt states she has taken Aleve for her headache. Pt also states she has been gargling with salt water and listerine, but it hasn't helped.

## 2011-12-12 NOTE — ED Notes (Signed)
Pt states she has a ride home. 

## 2011-12-12 NOTE — ED Notes (Signed)
Patient transported to X-ray 

## 2011-12-13 MED ORDER — DEXAMETHASONE 6 MG PO TABS
10.0000 mg | ORAL_TABLET | Freq: Once | ORAL | Status: AC
  Administered 2011-12-13: 10 mg via ORAL
  Filled 2011-12-13: qty 1

## 2011-12-13 MED ORDER — HYDROCODONE-ACETAMINOPHEN 7.5-500 MG/15ML PO SOLN
10.0000 mL | Freq: Once | ORAL | Status: AC
  Administered 2011-12-13: 10 mL via ORAL
  Filled 2011-12-13: qty 15

## 2011-12-13 MED ORDER — PENICILLIN G BENZATHINE 1200000 UNIT/2ML IM SUSP
1.2000 10*6.[IU] | Freq: Once | INTRAMUSCULAR | Status: AC
  Administered 2011-12-13: 1.2 10*6.[IU] via INTRAMUSCULAR
  Filled 2011-12-13: qty 2

## 2011-12-13 MED ORDER — HYDROCODONE-ACETAMINOPHEN 7.5-500 MG/15ML PO SOLN: 10.0000 mL | ORAL | Status: DC | PRN

## 2011-12-13 NOTE — ED Provider Notes (Signed)
History     CSN: [id number]  Arrival date & time 12/12/11  2253   First MD Initiated Contact with Patient 12/12/11 2352      No chief complaint on file.   (Consider location/radiation/quality/duration/timing/severity/associated sxs/prior treatment) Patient is a 61 y.o. female presenting with pharyngitis. The history is provided by the patient.  Sore Throat This is a new problem. The current episode started in the past 7 days. The problem occurs constantly. The problem has been gradually worsening. Associated symptoms include chills, congestion, coughing, myalgias and a sore throat. Pertinent negatives include no fever, headaches, nausea or rash.    Past Medical History  Diagnosis Date  . Diabetes mellitus   . Hypertension     Past Surgical History  Procedure Date  . Joint replacement     Bilateral hip and right knee    No family history on file.  History  Substance Use Topics  . Smoking status: Not on file  . Smokeless tobacco: Not on file  . Alcohol Use:     OB History    Grav Para Term Preterm Abortions TAB SAB Ect Mult Living                  Review of Systems  Constitutional: Positive for chills. Negative for fever.  HENT: Positive for congestion and sore throat. Negative for trouble swallowing.   Respiratory: Positive for cough.   Gastrointestinal: Negative.  Negative for nausea.  Musculoskeletal: Positive for myalgias.  Skin: Negative.  Negative for rash.  Neurological: Negative.  Negative for headaches.    Allergies  Review of patient's allergies indicates no known allergies.  Home Medications   Current Outpatient Rx  Name  Route  Sig  Dispense  Refill  . ATENOLOL 50 MG PO TABS   Oral   Take 50 mg by mouth daily.         Marland Kitchen GLIMEPIRIDE 2 MG PO TABS   Oral   Take 2 mg by mouth daily before breakfast.         . INSULIN GLARGINE 100 UNIT/ML  SOLN   Subcutaneous   Inject 40 Units into the skin at bedtime.         Marland Kitchen METFORMIN HCL  1000 MG PO TABS   Oral   Take 1,000 mg by mouth 2 (two) times daily with a meal.          . NAPROXEN SODIUM 220 MG PO TABS   Oral   Take 440 mg by mouth daily as needed. For pain           BP 161/82  Pulse 108  Temp 99.4 F (37.4 C) (Oral)  Resp 17  SpO2 97%  Physical Exam  Constitutional: She is oriented to person, place, and time. She appears well-developed and well-nourished.  HENT:  Head: Normocephalic.  Nose: Mucosal edema present.  Mouth/Throat: Uvula is midline and mucous membranes are normal. Mucous membranes are not dry. Oropharyngeal exudate and posterior oropharyngeal erythema present. No posterior oropharyngeal edema.  Neck: Normal range of motion. Neck supple.  Cardiovascular: Normal rate and regular rhythm.   Pulmonary/Chest: Effort normal and breath sounds normal.  Abdominal: Soft. Bowel sounds are normal. There is no tenderness. There is no rebound and no guarding.  Musculoskeletal: Normal range of motion.  Neurological: She is alert and oriented to person, place, and time.  Skin: Skin is warm and dry. No rash noted.  Psychiatric: She has a normal mood and affect.  ED Course  Procedures (including critical care time)  Labs Reviewed  RAPID STREP SCREEN - Abnormal; Notable for the following:    Streptococcus, Group A Screen (Direct) POSITIVE (*)  DELTA CHECK NOTED   All other components within normal limits   Dg Chest 2 View  12/12/2011  *RADIOLOGY REPORT*  Clinical Data: Right-sided axillary pain with productive cough.Difficulty breathing.  Hypertension  CHEST - 2 VIEW  Comparison: 02/16/2011  Findings: Slight scoliosis convex right. Cardiac size and mediastinal silhouette unremarkable.  No effusion or pneumothorax. Clear lung fields.  Stable appearance from priors.  IMPRESSION: Negative exam.   Original Report Authenticated By: Davonna Belling, M.D.      No diagnosis found. 1. Strep throat    MDM  Positive Strep. Bicillin given, decadron given.  Patient is tolerating PO fluids.        Rodena Medin, PA-C 12/13/11 [phone]

## 2011-12-13 NOTE — ED Notes (Signed)
Pt drinking ice water per request.

## 2011-12-13 NOTE — ED Provider Notes (Signed)
Medical screening examination/treatment/procedure(s) were performed by non-physician practitioner and as supervising physician I was immediately available for consultation/collaboration.  Olga Bourbeau T Diamond Jentz, MD 12/13/11 0624 

## 2012-01-26 ENCOUNTER — Emergency Department (HOSPITAL_COMMUNITY): Payer: PRIVATE HEALTH INSURANCE

## 2012-01-26 ENCOUNTER — Encounter (HOSPITAL_COMMUNITY): Payer: Self-pay | Admitting: *Deleted

## 2012-01-26 ENCOUNTER — Emergency Department (HOSPITAL_COMMUNITY)
Admission: EM | Admit: 2012-01-26 | Discharge: 2012-01-26 | Disposition: A | Discharge status: Home / Self Care | Payer: PRIVATE HEALTH INSURANCE | Attending: Emergency Medicine | Admitting: Emergency Medicine

## 2012-01-26 DIAGNOSIS — Z96649 Presence of unspecified artificial hip joint: Secondary | ICD-10-CM | POA: Insufficient documentation

## 2012-01-26 DIAGNOSIS — E119 Type 2 diabetes mellitus without complications: Secondary | ICD-10-CM | POA: Insufficient documentation

## 2012-01-26 DIAGNOSIS — B349 Viral infection, unspecified: Secondary | ICD-10-CM

## 2012-01-26 DIAGNOSIS — R109 Unspecified abdominal pain: Secondary | ICD-10-CM | POA: Insufficient documentation

## 2012-01-26 DIAGNOSIS — F17200 Nicotine dependence, unspecified, uncomplicated: Secondary | ICD-10-CM | POA: Insufficient documentation

## 2012-01-26 DIAGNOSIS — B9789 Other viral agents as the cause of diseases classified elsewhere: Secondary | ICD-10-CM | POA: Insufficient documentation

## 2012-01-26 DIAGNOSIS — Z96659 Presence of unspecified artificial knee joint: Secondary | ICD-10-CM | POA: Insufficient documentation

## 2012-01-26 DIAGNOSIS — Z794 Long term (current) use of insulin: Secondary | ICD-10-CM | POA: Insufficient documentation

## 2012-01-26 DIAGNOSIS — R0981 Nasal congestion: Secondary | ICD-10-CM

## 2012-01-26 DIAGNOSIS — M549 Dorsalgia, unspecified: Secondary | ICD-10-CM | POA: Insufficient documentation

## 2012-01-26 DIAGNOSIS — I10 Essential (primary) hypertension: Secondary | ICD-10-CM | POA: Insufficient documentation

## 2012-01-26 DIAGNOSIS — J3489 Other specified disorders of nose and nasal sinuses: Secondary | ICD-10-CM | POA: Insufficient documentation

## 2012-01-26 DIAGNOSIS — R51 Headache: Secondary | ICD-10-CM | POA: Insufficient documentation

## 2012-01-26 DIAGNOSIS — Z79899 Other long term (current) drug therapy: Secondary | ICD-10-CM | POA: Insufficient documentation

## 2012-01-26 LAB — GLUCOSE, CAPILLARY: Glucose-Capillary: 282 mg/dL — ABNORMAL HIGH (ref 70–99)

## 2012-01-26 LAB — BASIC METABOLIC PANEL
BUN: 16 mg/dL (ref 6–23)
CO2: 27 mEq/L (ref 19–32)
Calcium: 9.1 mg/dL (ref 8.4–10.5)
Chloride: 92 mEq/L — ABNORMAL LOW (ref 96–112)
Creatinine, Ser: 0.65 mg/dL (ref 0.50–1.10)
GFR calc Af Amer: >90 mL/min (ref >90)
GFR calc non Af Amer: >90 mL/min (ref >90)
Glucose, Bld: 279 mg/dL — ABNORMAL HIGH (ref 70–99)
Potassium: 3.5 mEq/L (ref 3.5–5.1)
Sodium: 133 mEq/L — ABNORMAL LOW (ref 135–145)

## 2012-01-26 LAB — URINALYSIS, ROUTINE W REFLEX MICROSCOPIC
Bilirubin Urine: NEGATIVE
Glucose, UA: 500 mg/dL — AB
Ketones, ur: NEGATIVE mg/dL
Nitrite: POSITIVE — AB
Protein, ur: 30 mg/dL — AB
Specific Gravity, Urine: 1.015 (ref 1.005–1.030)
Urobilinogen, UA: 1 mg/dL (ref 0.0–1.0)
pH: 5 (ref 5.0–8.0)

## 2012-01-26 LAB — HEPATIC FUNCTION PANEL
ALT: 7 U/L (ref 0–35)
AST: 12 U/L (ref 0–37)
Albumin: 3.7 g/dL (ref 3.5–5.2)
Alkaline Phosphatase: 94 U/L (ref 39–117)
Bilirubin, Direct: 0.1 mg/dL (ref 0.0–0.3)
Indirect Bilirubin: 0.3 mg/dL (ref 0.3–0.9)
Total Bilirubin: 0.4 mg/dL (ref 0.3–1.2)
Total Protein: 7.7 g/dL (ref 6.0–8.3)

## 2012-01-26 LAB — CBC
HCT: 38.2 % (ref 36.0–46.0)
Hemoglobin: 14 g/dL (ref 12.0–15.0)
MCH: 31.5 pg (ref 26.0–34.0)
MCHC: 36.6 g/dL — ABNORMAL HIGH (ref 30.0–36.0)
MCV: 86 fL (ref 78.0–100.0)
Platelets: 339 10*3/uL (ref 150–400)
RBC: 4.44 MIL/uL (ref 3.87–5.11)
RDW: 12.2 % (ref 11.5–15.5)
WBC: 8.7 10*3/uL (ref 4.0–10.5)

## 2012-01-26 LAB — URINE MICROSCOPIC-ADD ON

## 2012-01-26 LAB — TROPONIN I: Troponin I: <0.3 ng/mL (ref <0.30)

## 2012-01-26 MED ORDER — HYDROMORPHONE HCL PF 1 MG/ML IJ SOLN
1.0000 mg | Freq: Once | INTRAMUSCULAR | Status: AC
  Administered 2012-01-26: 1 mg via INTRAVENOUS
  Filled 2012-01-26: qty 1

## 2012-01-26 MED ORDER — NITROFURANTOIN MONOHYD MACRO 100 MG PO CAPS
100.0000 mg | ORAL_CAPSULE | Freq: Once | ORAL | Status: AC
  Administered 2012-01-26: 100 mg via ORAL
  Filled 2012-01-26: qty 1

## 2012-01-26 MED ORDER — PROMETHAZINE HCL 25 MG/ML IJ SOLN
25.0000 mg | Freq: Once | INTRAMUSCULAR | Status: AC
  Administered 2012-01-26: 25 mg via INTRAVENOUS
  Filled 2012-01-26: qty 1

## 2012-01-26 MED ORDER — DEXTROSE 5 % IV SOLN
1.0000 g | Freq: Once | INTRAVENOUS | Status: AC
  Administered 2012-01-26: 1 g via INTRAVENOUS
  Filled 2012-01-26: qty 10

## 2012-01-26 MED ORDER — OXYMETAZOLINE HCL 0.05 % NA SOLN
2.0000 | Freq: Once | NASAL | Status: AC
  Administered 2012-01-26: 2 via NASAL
  Filled 2012-01-26: qty 15

## 2012-01-26 MED ORDER — HYDROCODONE-ACETAMINOPHEN 5-325 MG PO TABS: 1.0000 | ORAL_TABLET | Freq: Four times a day (QID) | ORAL | Status: DC | PRN

## 2012-01-26 MED ORDER — LEVOFLOXACIN 750 MG PO TABS
750.0000 mg | ORAL_TABLET | Freq: Every day | ORAL | Status: DC
Start: 2012-01-26 — End: 2012-02-02

## 2012-01-26 MED ORDER — IBUPROFEN 800 MG PO TABS
800.0000 mg | ORAL_TABLET | Freq: Once | ORAL | Status: AC
  Administered 2012-01-26: 800 mg via ORAL
  Filled 2012-01-26: qty 1

## 2012-01-26 MED ORDER — ACETAMINOPHEN 325 MG PO TABS
650.0000 mg | ORAL_TABLET | Freq: Once | ORAL | Status: AC
  Administered 2012-01-26: 650 mg via ORAL
  Filled 2012-01-26: qty 2

## 2012-01-26 NOTE — ED Notes (Signed)
Pt c/o back pain; bilateral flank pain; shortness of breath; vomiting; chills; symptoms started this morning; tel-sinus tach rate 131 in triage; resp 28; sats 95%

## 2012-01-26 NOTE — ED Provider Notes (Signed)
History     CSN: [id number]  Arrival date & time 01/26/12  0130   First MD Initiated Contact with Patient 01/26/12 0215      Chief Complaint  Patient presents with  . Shortness of Breath  . Flank Pain    (Consider location/radiation/quality/duration/timing/severity/associated sxs/prior treatment) HPIShirley OMA Wilson is a 61 y.o. female presenting with back pain. 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 She's had a history of back pain in the past. She denies any antecedent trauma.  She's had some associated shortness of breath, no productive cough, no chest pain she has vomited x2 and has had a headache since the pain started. She also had a chill, but no documented fever. Patient is been able to walk, denies any numbness, tingling, urinary or fecal incontinence, abdominal pain.  Complains of UTI in the past, with frequency.   Past Medical History  Diagnosis Date  . Diabetes mellitus   . Hypertension     Past Surgical History  Procedure Date  . Joint replacement     Bilateral hip and right knee    No family history on file.  History  Substance Use Topics  . Smoking status: Current Every Day Smoker -- 1.0 packs/day    Types: Cigarettes  . Smokeless tobacco: Not on file  . Alcohol Use: No    OB History    Grav Para Term Preterm Abortions TAB SAB Ect Mult Living                  Review of Systems At least 10pt or greater review of systems completed and are negative except where specified in the HPI.  Allergies  Review of patient's allergies indicates no known allergies.  Home Medications   Current Outpatient Rx  Name  Route  Sig  Dispense  Refill  . ATENOLOL 50 MG PO TABS   Oral   Take 50 mg by mouth every morning.          Marland Kitchen GLIMEPIRIDE 2 MG PO TABS   Oral   Take 2 mg by mouth daily before breakfast.         . INSULIN GLARGINE 100 UNIT/ML [place] SOLN  Subcutaneous   Inject 40 Units into the skin at bedtime.         Marland Kitchen METFORMIN HCL 1000 MG PO TABS   Oral   Take 1,000 mg by mouth 2 (two) times daily with a meal.            BP 146/95  Pulse 128  Temp 100.2 F (37.9 C) (Oral)  Resp 22  SpO2 95%  Physical Exam  Nursing notes reviewed.  Electronic medical record reviewed. VITAL SIGNS:   Filed Vitals:   01/26/12 0138  BP: 146/95  Pulse: 128  Temp: 100.2 F (37.9 C)  TempSrc: Oral  Resp: 22  SpO2: 95%   CONSTITUTIONAL: Awake, oriented, appears non-toxic but uncomfortable HENT: Atraumatic, normocephalic, oral mucosa pink and moist, airway patent. Nares patent without drainage. External ears normal. EYES: Conjunctiva clear, EOMI, PERRLA NECK: Trachea midline, non-tender, supple CARDIOVASCULAR: Tachycardic, Normal rhythm, No murmurs, rubs, gallops PULMONARY/CHEST: Clear to auscultation, no rhonchi, wheezes, or rales. Symmetrical breath sounds. Non-tender. ABDOMINAL: Non-distended, soft, non-tender - no rebound or guarding.  BS normal. NEUROLOGIC: Non-focal, moving all four extremities, no gross sensory or motor deficits. Reflexes 1+ bilaterally. No clonus. BAck: Mild TTP  across lower back in the PS muscles L3-L4 EXTREMITIES: No clubbing, cyanosis, or edema. Well-healed scar over the left hip.  SKIN: Warm, Dry, No erythema, No rash  ED Course  Procedures (including critical care time)  Labs Reviewed  CBC - Abnormal; Notable for the following:    MCHC 36.6 (*)     All other components within normal limits  GLUCOSE, CAPILLARY - Abnormal; Notable for the following:    Glucose-Capillary 282 (*)     All other components within normal limits  BASIC METABOLIC PANEL  TROPONIN I  URINALYSIS, ROUTINE W REFLEX MICROSCOPIC  HEPATIC FUNCTION PANEL   Ct Abdomen Pelvis Wo Contrast  01/26/2012  *RADIOLOGY REPORT*  Clinical Data: Bilateral flank pain, worse on the left.  Red blood cells and white blood cells in the urine.  CT  ABDOMEN AND PELVIS WITHOUT CONTRAST  Technique:  Multidetector CT imaging of the abdomen and pelvis was performed following the standard protocol without intravenous contrast.  Comparison: CT of the abdomen and pelvis performed 09/16/2010  Findings: Mild patchy airspace opacity is noted at the right lung base.  This is concerning for pneumonia.  The liver and spleen are unremarkable in appearance.  The gallbladder is within normal limits.  The pancreas is unremarkable in appearance.  There is mild prominence of both adrenal glands, possibly reflecting mild adrenal hyperplasia.  The right renal cystic lesion is difficult to characterize on noncontrast images.  The kidneys are otherwise grossly unremarkable.  There is no evidence of hydronephrosis.  No renal or ureteral stones are seen.  No perinephric stranding is appreciated.  No free fluid is identified.  The small bowel is unremarkable in appearance.  The stomach is within normal limits.  No acute vascular abnormalities are seen.  Minimal calcification is noted along the abdominal aorta.  The appendix normal in caliber, without evidence for appendicitis. The colon is unremarkable in appearance.  The bladder is is mildly distended and not well assessed due to metal artifact from the patient's hip prostheses.  The uterus is somewhat prominent; this may reflect underlying fibroids.  The ovaries are relatively symmetric; no suspicious adnexal masses are seen.  No inguinal lymphadenopathy is seen.  No acute osseous abnormalities are identified.  Bilateral hip prostheses appear grossly intact, though incompletely imaged.  IMPRESSION:  1.  Bladder not well assessed due to the artifact from the patient's hip prosthesis.  Known right renal cystic lesion not well characterized on the current study.  It appears grossly stable in size from multiple prior studies.  Kidneys otherwise unremarkable in appearance. 2.  Mild patchy airspace opacity at the right lung base, concerning  for pneumonia. 3.  Mild prominence of the adrenal glands may reflect mild adrenal hyperplasia. 4.  Mild prominence of the uterus could reflect underlying fibroids.   Original Report Authenticated By: Tonia Ghent, M.D.    Dg Chest 2 View  01/26/2012  *RADIOLOGY REPORT*  Clinical Data: Chest pain, shortness of breath and weakness.  CHEST - 2 VIEW  Comparison: Chest radiograph performed 12/12/2011  Findings: The lungs are well-aerated.  Chronically increased interstitial markings are noted.  There is no evidence of focal opacification, pleural effusion or pneumothorax.  The heart is borderline normal in size; the mediastinal contour is within normal limits.  No acute osseous abnormalities are seen.  IMPRESSION: Mild chronic lung changes seen; no acute cardiopulmonary process identified.   Original Report Authenticated By: Tonia Ghent, M.D.      1. Viral syndrome  2. Headache   3. Nose congestion   4. Back pain       MDM  [name] is a 61 y.o. female presenting with left hip pain and back pain. Patient has a temperature of 100.2 he does give a history of some cough and shortness of breath with chills.  Abdomen is benign at this time - concerned flank pain could be kidney stone.  Pt feeling better, CT is unremarkable - some concern on CT for right basilar PNA not seen on CXR.  PT has UTI - will treat with levofloxacin and cover her for respiratory pathogens as well.  Pain medicine given.  Neurologically intact.  Do not think patient has cord compression syndrome or acute [place] infection.  Back pain could be related to UTI vs MSK pain as palpated.    I explained the diagnosis and have given explicit precautions to return to the ER for any other new or worsening symptoms. The patient understands and accepts the medical plan as it's been dictated and I have answered their questions. Discharge instructions concerning home care and prescriptions have been given.  The patient is STABLE and is  discharged to home in good condition.       Jones Skene, MD 01/26/12 2310

## 2012-01-28 ENCOUNTER — Encounter (HOSPITAL_COMMUNITY): Payer: Self-pay | Admitting: Emergency Medicine

## 2012-01-28 ENCOUNTER — Emergency Department (HOSPITAL_COMMUNITY)
Admission: EM | Admit: 2012-01-28 | Discharge: 2012-01-29 | Disposition: A | Discharge status: Home / Self Care | Payer: PRIVATE HEALTH INSURANCE | Attending: Emergency Medicine | Admitting: Emergency Medicine

## 2012-01-28 DIAGNOSIS — I10 Essential (primary) hypertension: Secondary | ICD-10-CM | POA: Insufficient documentation

## 2012-01-28 DIAGNOSIS — Z79899 Other long term (current) drug therapy: Secondary | ICD-10-CM | POA: Insufficient documentation

## 2012-01-28 DIAGNOSIS — N8003 Adenomyosis of the uterus: Secondary | ICD-10-CM

## 2012-01-28 DIAGNOSIS — M545 Low back pain, unspecified: Secondary | ICD-10-CM | POA: Insufficient documentation

## 2012-01-28 DIAGNOSIS — N8000 Endometriosis of the uterus, unspecified: Secondary | ICD-10-CM | POA: Insufficient documentation

## 2012-01-28 DIAGNOSIS — D259 Leiomyoma of uterus, unspecified: Secondary | ICD-10-CM | POA: Insufficient documentation

## 2012-01-28 DIAGNOSIS — Z794 Long term (current) use of insulin: Secondary | ICD-10-CM | POA: Insufficient documentation

## 2012-01-28 DIAGNOSIS — Z87440 Personal history of urinary (tract) infections: Secondary | ICD-10-CM | POA: Insufficient documentation

## 2012-01-28 DIAGNOSIS — E119 Type 2 diabetes mellitus without complications: Secondary | ICD-10-CM | POA: Insufficient documentation

## 2012-01-28 DIAGNOSIS — F17200 Nicotine dependence, unspecified, uncomplicated: Secondary | ICD-10-CM | POA: Insufficient documentation

## 2012-01-28 DIAGNOSIS — D219 Benign neoplasm of connective and other soft tissue, unspecified: Secondary | ICD-10-CM

## 2012-01-28 LAB — URINE CULTURE: Colony Count: >=100000

## 2012-01-28 LAB — URINALYSIS, ROUTINE W REFLEX MICROSCOPIC
Bilirubin Urine: NEGATIVE
Glucose, UA: >1000 mg/dL — AB
Hgb urine dipstick: NEGATIVE
Ketones, ur: NEGATIVE mg/dL
Leukocytes, UA: NEGATIVE
Nitrite: NEGATIVE
Protein, ur: 100 mg/dL — AB
Specific Gravity, Urine: 1.026 (ref 1.005–1.030)
Urobilinogen, UA: 1 mg/dL (ref 0.0–1.0)
pH: 5.5 (ref 5.0–8.0)

## 2012-01-28 LAB — URINE MICROSCOPIC-ADD ON

## 2012-01-28 LAB — WET PREP, GENITAL
Clue Cells Wet Prep HPF POC: NONE SEEN
Trich, Wet Prep: NONE SEEN
WBC, Wet Prep HPF POC: NONE SEEN
Yeast Wet Prep HPF POC: NONE SEEN

## 2012-01-28 MED ORDER — SODIUM CHLORIDE 0.9 % IV BOLUS (SEPSIS)
1000.0000 mL | Freq: Once | INTRAVENOUS | Status: AC
  Administered 2012-01-28: 1000 mL via INTRAVENOUS

## 2012-01-28 MED ORDER — HYDROMORPHONE HCL PF 1 MG/ML IJ SOLN
1.0000 mg | Freq: Once | INTRAMUSCULAR | Status: AC
  Administered 2012-01-28: 1 mg via INTRAVENOUS
  Filled 2012-01-28: qty 1

## 2012-01-28 MED ORDER — ONDANSETRON HCL 4 MG/2ML IJ SOLN
4.0000 mg | Freq: Once | INTRAMUSCULAR | Status: AC
  Administered 2012-01-28: 4 mg via INTRAVENOUS
  Filled 2012-01-28: qty 2

## 2012-01-28 NOTE — ED Provider Notes (Signed)
History     CSN: [id number]  Arrival date & time 01/28/12  2009   First MD Initiated Contact with Patient 01/28/12 2148      Chief Complaint  Patient presents with  . Abdominal Pain    (Consider location/radiation/quality/duration/timing/severity/associated sxs/prior treatment) HPI History provided by pt.   Pt c/o greater than one week of severe lower abdominal pain.  Associated w/ fever and N/V.  Denies diarrhea, hematemesis/hematochezia/melena, urinary and vaginal sx.  Has not had sexual intercourse in 3 years.  No past abdominal surgeries. Has had pain mid-line low back for the past several months as well.  Radiates down bilateral LEs and associated w/ paresthesias.  Denies bowel/bladder dysfunction.  Per prior chart, pt seen for multiple complaints including hip, low back and flank pain.  Had a CT abd/pelvis at that time to evaluated for nephrolithiasis and was negative.  Was treated for UTI.  Pt reports that pain has not improved despite compliance w/ abx and no relief w/ vicodin.     Past Medical History  Diagnosis Date  . Diabetes mellitus   . Hypertension     Past Surgical History  Procedure Date  . Joint replacement     Bilateral hip and right knee    No family history on file.  History  Substance Use Topics  . Smoking status: Current Every Day Smoker -- 1.0 packs/day    Types: Cigarettes  . Smokeless tobacco: Not on file  . Alcohol Use: No    OB History    Grav Para Term Preterm Abortions TAB SAB Ect Mult Living                  Review of Systems  All other systems reviewed and are negative.    Allergies  Review of patient's allergies indicates no known allergies.  Home Medications   Current Outpatient Rx  Name  Route  Sig  Dispense  Refill  . ATENOLOL 50 MG PO TABS   Oral   Take 100 mg by mouth every morning.          Marland Kitchen GLIMEPIRIDE 2 MG PO TABS   Oral   Take 2 mg by mouth daily before breakfast.         . HYDROCODONE-ACETAMINOPHEN 5-325  MG PO TABS   Oral   Take 1-2 tablets by mouth every 6 (six) hours as needed for pain.   17 tablet   0   . INSULIN GLARGINE 100 UNIT/ML [place] SOLN   Subcutaneous   Inject 40 Units into the skin at bedtime.         Marland Kitchen LEVOFLOXACIN 750 MG PO TABS   Oral   Take 750 mg by mouth daily.         Marland Kitchen METFORMIN HCL 1000 MG PO TABS   Oral   Take 1,000 mg by mouth 2 (two) times daily with a meal.            BP 174/93  Pulse 107  Temp 100.1 F (37.8 C) (Oral)  Resp 18  Ht 5\' 7"  (1.702 m)  Wt 195 lb (88.451 kg)  BMI 30.54 kg/m2  SpO2 98%  Physical Exam  Nursing note and vitals reviewed. Constitutional: She is oriented to person, place, and time. She appears well-developed and well-nourished. No distress.  HENT:  Head: Normocephalic and atraumatic.  Eyes:       Normal appearance  Neck: Normal range of motion.  Cardiovascular: Normal rate and regular rhythm.  Pulmonary/Chest: Effort normal and breath sounds normal. No respiratory distress.  Abdominal: Soft. Bowel sounds are normal. She exhibits no distension and no mass. There is no rebound and no guarding.       Obese.  Epigastric, periumbilical and suprapubic ttp.   Genitourinary:       No CVA tenderness.  Nml external genitalia.  No vaginal discharge/bleeding.  Cervix closed and appears nml.  Diffuse tenderness on bimanual exam.   Musculoskeletal: Normal range of motion.  Neurological: She is alert and oriented to person, place, and time.  Skin: Skin is warm and dry. No rash noted.  Psychiatric: She has a normal mood and affect. Her behavior is normal.    ED Course  Procedures (including critical care time)  Labs Reviewed  URINALYSIS, ROUTINE W REFLEX MICROSCOPIC - Abnormal; Notable for the following:    APPearance CLOUDY (*)     Glucose, UA >1000 (*)     Protein, ur 100 (*)     All other components within normal limits  URINE MICROSCOPIC-ADD ON - Abnormal; Notable for the following:    Squamous Epithelial / LPF FEW  (*)     Bacteria, UA FEW (*)     All other components within normal limits  WET PREP, GENITAL  GC/CHLAMYDIA PROBE AMP   US Transvaginal Non-ob  01/29/2012  *RADIOLOGY REPORT*  Clinical Data: Pelvic pain.  TRANSABDOMINAL AND TRANSVAGINAL ULTRASOUND OF PELVIS  Technique:  Both transabdominal and transvaginal ultrasound examinations of the pelvis were performed including evaluation of the uterus, ovaries, adnexal regions, and pelvic cul-de-sac.  Comparison: CT 01/26/2012  Findings:  Uterus: 8.1 x 5.8 x 7.3 cm.  Right lateral fibroid measures up to 5.5 cm. Diffusely heterogeneous echotexture throughout the uterus.  Endometrium: Not well visualized due to the heterogeneous echotexture and fibroid.  Right Ovary: Not visualized.  No adnexal mass.  Left Ovary: Not visualized.  No adnexal mass.  Other Findings:  No free fluid.  IMPRESSION: 5.5 cm right lateral fibroid.  Diffusely heterogeneous echotexture could reflect adenomyosis.   Original Report Authenticated By: Charlett Nose, M.D.    US Pelvis Complete  01/29/2012  *RADIOLOGY REPORT*  Clinical Data: Pelvic pain.  TRANSABDOMINAL AND TRANSVAGINAL ULTRASOUND OF PELVIS  Technique:  Both transabdominal and transvaginal ultrasound examinations of the pelvis were performed including evaluation of the uterus, ovaries, adnexal regions, and pelvic cul-de-sac.  Comparison: CT 01/26/2012  Findings:  Uterus: 8.1 x 5.8 x 7.3 cm.  Right lateral fibroid measures up to 5.5 cm. Diffusely heterogeneous echotexture throughout the uterus.  Endometrium: Not well visualized due to the heterogeneous echotexture and fibroid.  Right Ovary: Not visualized.  No adnexal mass.  Left Ovary: Not visualized.  No adnexal mass.  Other Findings:  No free fluid.  IMPRESSION: 5.5 cm right lateral fibroid.  Diffusely heterogeneous echotexture could reflect adenomyosis.   Original Report Authenticated By: Charlett Nose, M.D.      1. Adenomyosis   2. Fibroid   3. Low back pain       MDM  62yo  F presents for second time in 3 days with lower abdominal pain + N/V as well as non-traumatic, mid-line low back pain.  Treated for UTI at last visit.  Low back pain is chronic and history and exam are most consistent w/ sciatica.  CT abd/pelvis w/out contrast obtained on 12/30 showed no acute intra-abd or osseous abnormalities.  Recommended f/u with [facility], where she is a patient, but referred to [facility] as well.  Exam  sig for tenderness down mid-line abd and diffuse tenderness on bimanual.  U/A and wet prep neg.  Transvaginal US shows right-sided uterine fibroid and adenomyosis.  Results discussed w/ pt.  Pt prescribed vicodin and zofran and referred to Gyn.  Return precautions discussed.       Otilio Miu, PA-C 01/29/12 0802  Otilio Miu, PA-C 01/29/12 [phone]

## 2012-01-28 NOTE — ED Notes (Signed)
Pelvic setup at bedside.

## 2012-01-28 NOTE — ED Notes (Signed)
Pt c/o lower abd cramping radiating to bilat flanks. N/V today. Pt was seen here for same 12/30. States meds not helping. PWD. A & O

## 2012-01-29 ENCOUNTER — Emergency Department (HOSPITAL_COMMUNITY): Payer: PRIVATE HEALTH INSURANCE

## 2012-01-29 LAB — GC/CHLAMYDIA PROBE AMP
CT Probe RNA: NEGATIVE
GC Probe RNA: NEGATIVE

## 2012-01-29 MED ORDER — ONDANSETRON HCL 4 MG/2ML IJ SOLN
4.0000 mg | Freq: Once | INTRAMUSCULAR | Status: AC
  Administered 2012-01-29: 4 mg via INTRAVENOUS
  Filled 2012-01-29: qty 2

## 2012-01-29 MED ORDER — ONDANSETRON HCL 8 MG PO TABS: 8.0000 mg | ORAL_TABLET | Freq: Three times a day (TID) | ORAL | Status: DC | PRN

## 2012-01-29 MED ORDER — OXYCODONE-ACETAMINOPHEN 5-325 MG PO TABS: 1.0000 | ORAL_TABLET | ORAL | Status: DC | PRN

## 2012-01-29 NOTE — ED Provider Notes (Signed)
Medical screening examination/treatment/procedure(s) were performed by non-physician practitioner and as supervising physician I was immediately available for consultation/collaboration.   David H Yao, MD 01/29/12 1455 

## 2012-03-04 ENCOUNTER — Emergency Department (HOSPITAL_COMMUNITY): Payer: PRIVATE HEALTH INSURANCE

## 2012-03-04 ENCOUNTER — Encounter (HOSPITAL_COMMUNITY): Payer: Self-pay | Admitting: Cardiology

## 2012-03-04 ENCOUNTER — Inpatient Hospital Stay (HOSPITAL_COMMUNITY)
Admission: EM | Admit: 2012-03-04 | Discharge: 2012-03-06 | DRG: 287 | Disposition: A | Discharge status: Home / Self Care | Payer: PRIVATE HEALTH INSURANCE | Attending: Internal Medicine | Admitting: Internal Medicine

## 2012-03-04 DIAGNOSIS — Z720 Tobacco use: Secondary | ICD-10-CM

## 2012-03-04 DIAGNOSIS — I428 Other cardiomyopathies: Secondary | ICD-10-CM

## 2012-03-04 DIAGNOSIS — Z96659 Presence of unspecified artificial knee joint: Secondary | ICD-10-CM

## 2012-03-04 DIAGNOSIS — Z79899 Other long term (current) drug therapy: Secondary | ICD-10-CM

## 2012-03-04 DIAGNOSIS — I110 Hypertensive heart disease with heart failure: Principal | ICD-10-CM | POA: Diagnosis present

## 2012-03-04 DIAGNOSIS — I509 Heart failure, unspecified: Secondary | ICD-10-CM | POA: Diagnosis present

## 2012-03-04 DIAGNOSIS — E876 Hypokalemia: Secondary | ICD-10-CM

## 2012-03-04 DIAGNOSIS — Z888 Allergy status to other drugs, medicaments and biological substances status: Secondary | ICD-10-CM

## 2012-03-04 DIAGNOSIS — I2510 Atherosclerotic heart disease of native coronary artery without angina pectoris: Secondary | ICD-10-CM

## 2012-03-04 DIAGNOSIS — I10 Essential (primary) hypertension: Secondary | ICD-10-CM

## 2012-03-04 DIAGNOSIS — E119 Type 2 diabetes mellitus without complications: Secondary | ICD-10-CM

## 2012-03-04 DIAGNOSIS — I5043 Acute on chronic combined systolic (congestive) and diastolic (congestive) heart failure: Secondary | ICD-10-CM

## 2012-03-04 DIAGNOSIS — I200 Unstable angina: Secondary | ICD-10-CM

## 2012-03-04 DIAGNOSIS — I161 Hypertensive emergency: Secondary | ICD-10-CM

## 2012-03-04 DIAGNOSIS — I119 Hypertensive heart disease without heart failure: Secondary | ICD-10-CM

## 2012-03-04 DIAGNOSIS — F17200 Nicotine dependence, unspecified, uncomplicated: Secondary | ICD-10-CM | POA: Diagnosis present

## 2012-03-04 DIAGNOSIS — R0902 Hypoxemia: Secondary | ICD-10-CM

## 2012-03-04 DIAGNOSIS — Z794 Long term (current) use of insulin: Secondary | ICD-10-CM

## 2012-03-04 DIAGNOSIS — Z7982 Long term (current) use of aspirin: Secondary | ICD-10-CM

## 2012-03-04 DIAGNOSIS — Z96649 Presence of unspecified artificial hip joint: Secondary | ICD-10-CM

## 2012-03-04 HISTORY — DX: Atherosclerotic heart disease of native coronary artery without angina pectoris: I25.10

## 2012-03-04 HISTORY — DX: Chronic combined systolic (congestive) and diastolic (congestive) heart failure: I50.42

## 2012-03-04 HISTORY — DX: Tobacco use: Z72.0

## 2012-03-04 HISTORY — PX: CARDIAC CATHETERIZATION: SHX172

## 2012-03-04 LAB — CBC WITH DIFFERENTIAL/PLATELET
Basophils Absolute: 0 10*3/uL (ref 0.0–0.1)
Basophils Relative: 0 % (ref 0–1)
Eosinophils Absolute: 0.1 10*3/uL (ref 0.0–0.7)
Eosinophils Relative: 1 % (ref 0–5)
HCT: 37.1 % (ref 36.0–46.0)
Hemoglobin: 13.5 g/dL (ref 12.0–15.0)
Lymphocytes Relative: 22 % (ref 12–46)
Lymphs Abs: 1.9 10*3/uL (ref 0.7–4.0)
MCH: 31.5 pg (ref 26.0–34.0)
MCHC: 36.4 g/dL — ABNORMAL HIGH (ref 30.0–36.0)
MCV: 86.7 fL (ref 78.0–100.0)
Monocytes Absolute: 0.5 10*3/uL (ref 0.1–1.0)
Monocytes Relative: 6 % (ref 3–12)
Neutro Abs: 6.1 10*3/uL (ref 1.7–7.7)
Neutrophils Relative %: 72 % (ref 43–77)
Platelets: 505 10*3/uL — ABNORMAL HIGH (ref 150–400)
RBC: 4.28 MIL/uL (ref 3.87–5.11)
RDW: 13 % (ref 11.5–15.5)
WBC: 8.5 10*3/uL (ref 4.0–10.5)

## 2012-03-04 LAB — GLUCOSE, CAPILLARY: Glucose-Capillary: 333 mg/dL — ABNORMAL HIGH (ref 70–99)

## 2012-03-04 LAB — BASIC METABOLIC PANEL
BUN: 12 mg/dL (ref 6–23)
CO2: 30 mEq/L (ref 19–32)
Calcium: 9.4 mg/dL (ref 8.4–10.5)
Chloride: 97 mEq/L (ref 96–112)
Creatinine, Ser: 0.58 mg/dL (ref 0.50–1.10)
GFR calc Af Amer: >90 mL/min (ref >90)
GFR calc non Af Amer: >90 mL/min (ref >90)
Glucose, Bld: 346 mg/dL — ABNORMAL HIGH (ref 70–99)
Potassium: 3.5 mEq/L (ref 3.5–5.1)
Sodium: 137 mEq/L (ref 135–145)

## 2012-03-04 LAB — POCT I-STAT, CHEM 8
BUN: 12 mg/dL (ref 6–23)
Calcium, Ion: 1.12 mmol/L (ref 1.12–1.23)
Chloride: 98 mEq/L (ref 96–112)
Creatinine, Ser: 0.7 mg/dL (ref 0.50–1.10)
Glucose, Bld: 335 mg/dL — ABNORMAL HIGH (ref 70–99)
HCT: 40 % (ref 36.0–46.0)
Hemoglobin: 13.6 g/dL (ref 12.0–15.0)
Potassium: 3.2 mEq/L — ABNORMAL LOW (ref 3.5–5.1)
Sodium: 137 mEq/L (ref 135–145)
TCO2: 29 mmol/L (ref 0–100)

## 2012-03-04 LAB — PRO B NATRIURETIC PEPTIDE: Pro B Natriuretic peptide (BNP): 1067 pg/mL — ABNORMAL HIGH (ref 0–125)

## 2012-03-04 LAB — CG4 I-STAT (LACTIC ACID): Lactic Acid, Venous: 2.5 mmol/L — ABNORMAL HIGH (ref 0.5–2.2)

## 2012-03-04 LAB — LIPASE, BLOOD: Lipase: 11 U/L (ref 11–59)

## 2012-03-04 LAB — POCT I-STAT TROPONIN I: Troponin i, poc: 0.02 ng/mL (ref 0.00–0.08)

## 2012-03-04 MED ORDER — FENTANYL CITRATE 0.05 MG/ML IJ SOLN
50.0000 ug | Freq: Once | INTRAMUSCULAR | Status: AC
  Administered 2012-03-04: 50 ug via INTRAVENOUS
  Filled 2012-03-04: qty 2

## 2012-03-04 MED ORDER — LABETALOL HCL 5 MG/ML IV SOLN
10.0000 mg | Freq: Once | INTRAVENOUS | Status: AC
  Administered 2012-03-04: 10 mg via INTRAVENOUS
  Filled 2012-03-04: qty 4

## 2012-03-04 MED ORDER — FUROSEMIDE 10 MG/ML IJ SOLN
40.0000 mg | Freq: Once | INTRAMUSCULAR | Status: AC
  Administered 2012-03-04: 40 mg via INTRAVENOUS
  Filled 2012-03-04: qty 4

## 2012-03-04 MED ORDER — IOHEXOL 350 MG/ML SOLN
100.0000 mL | Freq: Once | INTRAVENOUS | Status: AC | PRN
  Administered 2012-03-04: 60 mL via INTRAVENOUS

## 2012-03-04 MED ORDER — ONDANSETRON HCL 4 MG/2ML IJ SOLN
4.0000 mg | Freq: Once | INTRAMUSCULAR | Status: AC
  Administered 2012-03-04: 4 mg via INTRAVENOUS
  Filled 2012-03-04: qty 2

## 2012-03-04 MED ORDER — NITROGLYCERIN IN D5W 200-5 MCG/ML-% IV SOLN: 2.0000 ug/min | Freq: Once | INTRAVENOUS | Status: DC

## 2012-03-04 NOTE — ED Notes (Signed)
Pt to CT at this time.m

## 2012-03-04 NOTE — ED Notes (Signed)
Pt resting at this time.  St's pain has improved at this time.  Pt watching TV

## 2012-03-04 NOTE — H&P (Signed)
Cardiology History and Physical Provider Not In System  History of Present Illness (and review of medical records): [name] is a 62 y.o. female who presents for evaluation of shortness of breath.  She states symptoms have been present for one week and have gotten progressively worse.  She also has had dyspnea on mild exertion, PND, orthopnea.  She reports mid sternal chest pain which last 1-2 minutes over past 3-4 days.  She denies any recent similar symptoms.  She has had recent viral illness but denies any OTC medications.  She reports that she watches her salt intake.  She has been without lasix for sometime now (1-80months), but states she has been taking her Lisinopril.  She presented to the ED as her symptoms did not improve and had difficulty sleeping.  On arrival SBP 180s-200s.  She was given IV lasix, BB, and placed on oxygen.  Cardiology was called for further management.  Upon intial evaluation, she is chest pain free, but continues to be short of breath at rest on 5L oxygen.  Previous diagnostic testing for coronary artery disease includes: echocardiogram. Previous history of cardiac disease includes Chest Pain. Coronary artery disease risk factors include: diabetes mellitus, hypertension, obesity (BMI >= 30 kg/m2) and smoking/ tobacco exposure. Patient denies history of arrhythmia, cardiomyopathy, ischemic heart disease, previous M.I. and valvular disease.  Review of Systems Further review of systems was otherwise negative other than stated in HPI.  Patient Active Problem List   Diagnosis Date Noted  . Shortness of breath 03/04/2012  . DM 02/01/2009  . BRONCHITIS 02/01/2009  . SLEEP APNEA 02/01/2009  . CHEST PAIN 02/01/2009  . HYPERGLYCEMIA 02/01/2009   Past Medical History  Diagnosis Date  . Diabetes mellitus   . Hypertension     Past Surgical History  Procedure Date  . Joint replacement     Bilateral hip and right knee    No current facility-administered  medications for this encounter.   Current Outpatient Prescriptions  Medication Sig Dispense Refill  . HYDROcodone-acetaminophen (NORCO/VICODIN) 5-325 MG per tablet Take 1-2 tablets by mouth every 6 (six) hours as needed for pain.  17 tablet  0  . insulin glargine (LANTUS) 100 UNIT/ML injection Inject 40 Units into the skin at bedtime.      . metFORMIN (GLUCOPHAGE) 1000 MG tablet Take 1,000 mg by mouth every evening.         Allergies  Allergen Reactions  . Morphine And Related Nausea And Vomiting    Severe nausea    History  Substance Use Topics  . Smoking status: Current Every Day Smoker -- 1.0 packs/day    Types: Cigarettes  . Smokeless tobacco: Not on file  . Alcohol Use: No    History reviewed. No pertinent family history.   Objective: Patient Vitals for the past 8 hrs:  BP Temp Temp src Pulse Resp SpO2  03/04/12 1850 203/116 mmHg - - 110  25  90 %  03/04/12 1847 140/74 mmHg - - 112  35  85 %  03/04/12 1828 187/119 mmHg 98.9 F (37.2 C) Oral - - -  03/04/12 1827 - - - 120  20  98 %  03/04/12 1819 189/113 mmHg 98.5 F (36.9 C) Oral 126  18  91 %   General Appearance:    Alert, cooperative, obese AAF in mild distress  Head:    Normocephalic, without obvious abnormality, atraumatic  Eyes:     PERRL, EOMI, anicteric sclerae  Neck:   Supple, positive  JVD to angle of jaw  Lungs:     Clear, no wheezing, respirations mildly labored  Heart:    Tachycardic, S1 and S2 normal, no murmur  Abdomen:     Soft, non-tender, normoactive bowel sounds  Extremities:   Extremities normal, atraumatic, no cyanosis or edema  Pulses:   2+ and symmetric all extremities  Skin:   no rashes or lesions  Neurologic:   No focal deficits. AAO x3   Results for orders placed during the hospital encounter of 03/04/12 (from the past 48 hour(s))  GLUCOSE, CAPILLARY     Status: Abnormal   Collection Time   03/04/12  6:23 PM      Component Value Range Comment   Glucose-Capillary 333 (*) 70 - 99 mg/dL     Comment 1 Documented in Chart      Comment 2 Notify RN     CBC WITH DIFFERENTIAL     Status: Abnormal   Collection Time   03/04/12  7:11 PM      Component Value Range Comment   WBC 8.5  4.0 - 10.5 K/uL    RBC 4.28  3.87 - 5.11 MIL/uL    Hemoglobin 13.5  12.0 - 15.0 g/dL    HCT 16.1  09.6 - 04.5 %    MCV 86.7  78.0 - 100.0 fL    MCH 31.5  26.0 - 34.0 pg    MCHC 36.4 (*) 30.0 - 36.0 g/dL    RDW 40.9  81.1 - 91.4 %    Platelets 505 (*) 150 - 400 K/uL    Neutrophils Relative 72  43 - 77 %    Neutro Abs 6.1  1.7 - 7.7 K/uL    Lymphocytes Relative 22  12 - 46 %    Lymphs Abs 1.9  0.7 - 4.0 K/uL    Monocytes Relative 6  3 - 12 %    Monocytes Absolute 0.5  0.1 - 1.0 K/uL    Eosinophils Relative 1  0 - 5 %    Eosinophils Absolute 0.1  0.0 - 0.7 K/uL    Basophils Relative 0  0 - 1 %    Basophils Absolute 0.0  0.0 - 0.1 K/uL   LIPASE, BLOOD     Status: Normal   Collection Time   03/04/12  7:11 PM      Component Value Range Comment   Lipase 11  11 - 59 U/L   BASIC METABOLIC PANEL     Status: Abnormal   Collection Time   03/04/12  7:11 PM      Component Value Range Comment   Sodium 137  135 - 145 mEq/L    Potassium 3.5  3.5 - 5.1 mEq/L    Chloride 97  96 - 112 mEq/L    CO2 30  19 - 32 mEq/L    Glucose, Bld 346 (*) 70 - 99 mg/dL    BUN 12  6 - 23 mg/dL    Creatinine, Ser 7.82  0.50 - 1.10 mg/dL    Calcium 9.4  8.4 - 95.6 mg/dL    GFR calc non Af Amer >90  >90 mL/min    GFR calc Af Amer >90  >90 mL/min   POCT I-STAT TROPONIN I     Status: Normal   Collection Time   03/04/12  7:20 PM      Component Value Range Comment   Troponin i, poc 0.02  0.00 - 0.08 ng/mL    Comment 3  PRO B NATRIURETIC PEPTIDE     Status: Abnormal   Collection Time   03/04/12  8:35 PM      Component Value Range Comment   Pro B Natriuretic peptide (BNP) 1067.0 (*) 0 - 125 pg/mL   POCT I-STAT, CHEM 8     Status: Abnormal   Collection Time   03/04/12  8:46 PM      Component Value Range Comment   Sodium 137   135 - 145 mEq/L    Potassium 3.2 (*) 3.5 - 5.1 mEq/L    Chloride 98  96 - 112 mEq/L    BUN 12  6 - 23 mg/dL    Creatinine, Ser 0.45  0.50 - 1.10 mg/dL    Glucose, Bld 409 (*) 70 - 99 mg/dL    Calcium, Ion 8.11  9.14 - 1.23 mmol/L    TCO2 29  0 - 100 mmol/L    Hemoglobin 13.6  12.0 - 15.0 g/dL    HCT 78.2  95.6 - 21.3 %   CG4 I-STAT (LACTIC ACID)     Status: Abnormal   Collection Time   03/04/12  8:46 PM      Component Value Range Comment   Lactic Acid, Venous 2.50 (*) 0.5 - 2.2 mmol/L    Ct Angio Chest Pe W/cm &/or Wo Cm  03/04/2012  *RADIOLOGY REPORT*  Clinical Data: Severe shortness of breath, cough, and chest pain.  CT ANGIOGRAPHY CHEST  Technique:  Multidetector CT imaging of the chest using the standard protocol during bolus administration of intravenous contrast. Multiplanar reconstructed images including MIPs were obtained and reviewed to evaluate the vascular anatomy.  Contrast: 60mL OMNIPAQUE IOHEXOL 350 MG/ML SOLN  Comparison: None.  Findings: Technically adequate study with good opacification of the central and segmental pulmonary arteries.  No focal filling defects are demonstrated.  No evidence of significant pulmonary embolus.  Cardiac enlargement.  No pericardial effusions.  Normal caliber thoracic aorta.  No significant lymphadenopathy in the chest.  The esophagus is decompressed.  There are small bilateral pleural effusions, greater on the right side, with atelectasis or consolidation in both lung bases.  There are multi focal patchy perihilar and peripheral areas of airspace disease throughout both lungs.  This pattern may represent multifocal pneumonia or edema.  Nodular infiltrative process not excluded.  No pneumothorax.  Airways appear patent.  Visualized portions of the upper abdominal organs are unremarkable. Degenerative changes in the thoracic spine with normal alignment.  IMPRESSION: No evidence of significant pulmonary embolus.  Cardiac enlargement with pulmonary vascular  congestion.  Bilateral pleural effusions. Bibasilar atelectasis.  Patchy areas of airspace disease diffusely throughout both lungs.  Findings likely represent edema or multifocal pneumonia.   Original Report Authenticated By: Burman Nieves, M.D.    Dg Chest Port 1 View  03/04/2012  *RADIOLOGY REPORT*  Clinical Data: Chest pain, cough and shortness of breath.  PORTABLE CHEST - 1 VIEW  Comparison: PA and lateral chest 01/26/2012.  Findings: There is indistinctness of the pulmonary vasculature compatible with interstitial edema.  Heart size is enlarged.  No pneumothorax is identified.  There may be small bilateral pleural effusions and basilar atelectasis.  Study is somewhat limited by portable technique and the patient's body habitus.  IMPRESSION: Mild interstitial edema with possible small bilateral pleural effusions and basilar atelectasis.   Original Report Authenticated By: Holley Dexter, M.D.    Dg Abd Portable 1v  03/04/2012  *RADIOLOGY REPORT*  Clinical Data: Abdominal pain.  PORTABLE ABDOMEN - 1  VIEW  Comparison: CT abdomen and pelvis 01/26/2012.  Findings: The bowel gas pattern is unremarkable.  No focal bony abnormality is identified.  Bilateral hip replacements are seen.  IMPRESSION: No acute finding.   Original Report Authenticated By: Holley Dexter, M.D.     ECG:  Sinus tachycardia, HR 122, LAD, LBBB  Assessment: 25F hx of HTN, DM p/w shortness of breath, dyspnea on exertion, PND, orthopnea along with BNP, CXR/CT consistent with volume overload likely secondary to congestive heart failure.  Will admit to stepdown unit for IV diuresis and further assessment of LV dysfunction.    Plan: Acute CHF IV Lasix 40mg  now and then BID Strict I/Os, daily weights, foley cath, 2gm Na diet Heart failure education Continue ACEI, hold BB as currently volume overload and patient not taking prior Atenolol TTE in am assess LV function, wall motion and valvular  disorder  Hypertension-uncontrolled Enalapril IV now, then po BID tomorrow Hydralazine prn May add coreg when euvolemic  Atypical chest pain r/o MI Trend cardiac biomarkers ecg prn with chest pain TTE as above  Consider further ischemic evaluation when euvolemic and BP controlled  DM-hyperglycemia Lantus home dose Hold metformin given contrast with CT and possible inpatient procedures SSI and TID with meals Check [id number]

## 2012-03-04 NOTE — ED Provider Notes (Signed)
I saw and evaluated the patient, reviewed the resident's note and I agree with the findings and plan. I reviewed and interpreted the EKG during the patient's evaluation in the ED and agree with the resident's interpretation.  Pt presents with chest pain and shortness of breath.  She also has been having trouble with abdominal pain attributed to a cyst on her ovary.  She denies history of MI or PE.  Her symptoms are severe and have been progressing.  SHe is tachycardic and hypertensive.  ECG shows new LBBB.  Will check labs, xrays.  Assess for infection, cardiac or pulmonary etiology.  Will monitor closely.  Results showed pt has pulmonary edema.  Likely related to hypertensive emergency.  BP meds, diuretics.  Plan on admission for further treatment.  CRITICAL CARE Performed by: Celene Kras  ?  Total critical care time: 40  Critical care time was exclusive of separately billable procedures and treating other patients.  Critical care was necessary to treat or prevent imminent or life-threatening deterioration.  Critical care was time spent personally by me on the following activities: development of treatment plan with patient and/or surrogate as well as nursing, discussions with consultants, evaluation of patient's response to treatment, examination of patient, obtaining history from patient or surrogate, ordering and performing treatments and interventions, ordering and review of laboratory studies, ordering and review of radiographic studies, pulse oximetry and re-evaluation of patient's condition.   Celene Kras, MD 03/05/12 [phone]

## 2012-03-04 NOTE — ED Provider Notes (Signed)
History     CSN: [id number]  Arrival date & time 03/04/12  1815   First MD Initiated Contact with Patient 03/04/12 1840      Chief Complaint  Patient presents with  . Chest Pain  . Abdominal Pain    (Consider location/radiation/quality/duration/timing/severity/associated sxs/prior treatment) HPI Comments: 62 y/o F p/w SP and SOB. Intermittent >1 week. More constant past 5 days. Progressively worsening. Today with significant worsening of CP and SOB. Described as pressure. Non-radiating. Somewhat worse with exertion. SOB is exertional. Some n/v today. NB.   Patient is a 62 y.o. female presenting with shortness of breath and chest pain. The history is provided by the patient.  Shortness of Breath  The current episode started more than 1 week ago. The onset was gradual. The problem occurs continuously. The problem has been gradually worsening. The problem is severe. The symptoms are relieved by rest. The symptoms are aggravated by activity. Associated symptoms include chest pain, cough and shortness of breath. Pertinent negatives include no fever and no rhinorrhea.  Chest Pain The chest pain began 1 - 2 weeks ago. Chest pain occurs intermittently. The chest pain is worsening. The severity of the pain is moderate. The quality of the pain is described as pressure-like. The pain does not radiate. Chest pain is worsened by exertion. Primary symptoms include shortness of breath, cough and abdominal pain. Pertinent negatives for primary symptoms include no fever, no nausea, no vomiting and no dizziness.  Pertinent negatives for associated symptoms include no lower extremity edema.     Past Medical History  Diagnosis Date  . Diabetes mellitus   . Hypertension     Past Surgical History  Procedure Date  . Joint replacement     Bilateral hip and right knee    History reviewed. No pertinent family history.  History  Substance Use Topics  . Smoking status: Current Every Day Smoker -- 1.0  packs/day    Types: Cigarettes  . Smokeless tobacco: Not on file  . Alcohol Use: No    OB History    Grav Para Term Preterm Abortions TAB SAB Ect Mult Living                  Review of Systems  Constitutional: Negative for fever and chills.  HENT: Negative for congestion and rhinorrhea.   Respiratory: Positive for cough and shortness of breath.   Cardiovascular: Positive for chest pain. Negative for leg swelling.  Gastrointestinal: Positive for abdominal pain. Negative for nausea, vomiting and diarrhea.  Genitourinary: Negative for dysuria, hematuria, flank pain and difficulty urinating.  Skin: Negative for color change and rash.  Neurological: Negative for dizziness and headaches.  All other systems reviewed and are negative.    Allergies  Morphine and related  Home Medications   Current Outpatient Rx  Name  Route  Sig  Dispense  Refill  . ATENOLOL 50 MG PO TABS   Oral   Take 100 mg by mouth every morning.          Marland Kitchen GLIMEPIRIDE 2 MG PO TABS   Oral   Take 2 mg by mouth daily before breakfast.         . HYDROCODONE-ACETAMINOPHEN 5-325 MG PO TABS   Oral   Take 1-2 tablets by mouth every 6 (six) hours as needed for pain.   17 tablet   0   . INSULIN GLARGINE 100 UNIT/ML [place] SOLN   Subcutaneous   Inject 40 Units into the skin at bedtime.         Marland Kitchen  METFORMIN HCL 1000 MG PO TABS   Oral   Take 1,000 mg by mouth every evening.          . OXYCODONE-ACETAMINOPHEN 5-325 MG PO TABS   Oral   Take 1 tablet by mouth every 4 (four) hours as needed for pain.   20 tablet   0     BP 203/116  Pulse 110  Temp 98.9 F (37.2 C) (Oral)  Resp 25  SpO2 90%  Physical Exam  Nursing note and vitals reviewed. Constitutional: She is oriented to person, place, and time. She appears well-developed and well-nourished. No distress.  HENT:  Head: Normocephalic and atraumatic.  Eyes: Conjunctivae normal are normal. Right eye exhibits no discharge. Left eye exhibits no  discharge.  Neck: No tracheal deviation present.  Cardiovascular: Regular rhythm, normal heart sounds and intact distal pulses.        tachycardic  Pulmonary/Chest: No stridor. She has no wheezes. She has rales.       Tachypnea. diminished breath sounds with some rales.  Abdominal: Soft. She exhibits no distension. There is tenderness. There is no guarding.  Musculoskeletal: She exhibits no edema and no tenderness.  Neurological: She is alert and oriented to person, place, and time.  Skin: Skin is warm and dry.    ED Course  Procedures (including critical care time)  Labs Reviewed  GLUCOSE, CAPILLARY - Abnormal; Notable for the following:    Glucose-Capillary 333 (*)     All other components within normal limits  CBC WITH DIFFERENTIAL  LIPASE, BLOOD  BASIC METABOLIC PANEL   No results found.   No diagnosis found.   Date: 03/05/2012  Rate: 122  Rhythm: sinus tachycardia  QRS Axis: indeterminate  Intervals: normal  ST/T Wave abnormalities: indeterminate  Conduction Disutrbances:left bundle branch block  Narrative Interpretation:   Old EKG Reviewed: new LBBB    MDM    62 y/o F p/w SOB, CP. Also with abdominal pain. abd pain not worsening. Known to have uterine fibroids. Upon arrival, patient hypoxic and hypertensive. Placed on [place] and given dose of IV labetalol with improvement in symptoms and BP. Concern for likely hypertensive emergency with pulmonary edema.  Possible PE. CT scan ordered to further assess and negative. Changes c/w pulm edema.  Doubt pna as patient without fever or leukocytosis. Minimal cough. EKG as above. Troponin negative. Given 40 Lasix IV Cards consulted for admission.  Labs and imaging reviewed by myself and considered in medical decision making if ordered. Imaging interpreted by radiology.   Discussed case with Dr. Lynelle Doctor who is in agreement with assessment and plan.         Stevie Kern, MD 03/05/12 [phone]

## 2012-03-04 NOTE — ED Notes (Signed)
Pt reports she started having chest pain and abd pain over the past week. Reports she was told she has a large cyst on her ovary at [address] [facility] last week and may need surgery. Pt with n/v at triage. Pt is tearful and SOB.

## 2012-03-05 ENCOUNTER — Encounter (HOSPITAL_COMMUNITY)
Admission: EM | Disposition: A | Discharge status: Home / Self Care | Payer: Self-pay | Source: Home / Self Care | Attending: Internal Medicine

## 2012-03-05 DIAGNOSIS — I517 Cardiomegaly: Secondary | ICD-10-CM

## 2012-03-05 DIAGNOSIS — I200 Unstable angina: Secondary | ICD-10-CM

## 2012-03-05 DIAGNOSIS — I2510 Atherosclerotic heart disease of native coronary artery without angina pectoris: Secondary | ICD-10-CM

## 2012-03-05 DIAGNOSIS — I509 Heart failure, unspecified: Secondary | ICD-10-CM

## 2012-03-05 HISTORY — PX: LEFT HEART CATH: SHX5478

## 2012-03-05 LAB — GLUCOSE, CAPILLARY
Glucose-Capillary: 303 mg/dL — ABNORMAL HIGH (ref 70–99)
Glucose-Capillary: 292 mg/dL — ABNORMAL HIGH (ref 70–99)
Glucose-Capillary: 230 mg/dL — ABNORMAL HIGH (ref 70–99)
Glucose-Capillary: 143 mg/dL — ABNORMAL HIGH (ref 70–99)
Glucose-Capillary: 184 mg/dL — ABNORMAL HIGH (ref 70–99)

## 2012-03-05 LAB — BASIC METABOLIC PANEL
BUN: 10 mg/dL (ref 6–23)
BUN: 11 mg/dL (ref 6–23)
CO2: 33 mEq/L — ABNORMAL HIGH (ref 19–32)
CO2: 28 mEq/L (ref 19–32)
Calcium: 9.2 mg/dL (ref 8.4–10.5)
Calcium: 8.8 mg/dL (ref 8.4–10.5)
Chloride: 97 mEq/L (ref 96–112)
Chloride: 97 mEq/L (ref 96–112)
Creatinine, Ser: 0.61 mg/dL (ref 0.50–1.10)
Creatinine, Ser: 0.65 mg/dL (ref 0.50–1.10)
GFR calc Af Amer: >90 mL/min (ref >90)
GFR calc Af Amer: >90 mL/min (ref >90)
GFR calc non Af Amer: >90 mL/min (ref >90)
GFR calc non Af Amer: >90 mL/min (ref >90)
Glucose, Bld: 348 mg/dL — ABNORMAL HIGH (ref 70–99)
Glucose, Bld: 215 mg/dL — ABNORMAL HIGH (ref 70–99)
Potassium: 3.3 mEq/L — ABNORMAL LOW (ref 3.5–5.1)
Potassium: 3.6 mEq/L (ref 3.5–5.1)
Sodium: 139 mEq/L (ref 135–145)
Sodium: 137 mEq/L (ref 135–145)

## 2012-03-05 LAB — URINALYSIS, ROUTINE W REFLEX MICROSCOPIC
Bilirubin Urine: NEGATIVE
Glucose, UA: >1000 mg/dL — AB
Hgb urine dipstick: NEGATIVE
Ketones, ur: NEGATIVE mg/dL
Leukocytes, UA: NEGATIVE
Nitrite: NEGATIVE
Protein, ur: NEGATIVE mg/dL
Specific Gravity, Urine: 1.022 (ref 1.005–1.030)
Urobilinogen, UA: 0.2 mg/dL (ref 0.0–1.0)
pH: 5 (ref 5.0–8.0)

## 2012-03-05 LAB — TROPONIN I
Troponin I: <0.3 ng/mL (ref <0.30)
Troponin I: <0.3 ng/mL (ref <0.30)

## 2012-03-05 LAB — PROTIME-INR
INR: 1.02 (ref 0.00–1.49)
Prothrombin Time: 13.3 seconds (ref 11.6–15.2)

## 2012-03-05 LAB — CBC
HCT: 35.4 % — ABNORMAL LOW (ref 36.0–46.0)
HCT: 34.8 % — ABNORMAL LOW (ref 36.0–46.0)
Hemoglobin: 12.6 g/dL (ref 12.0–15.0)
Hemoglobin: 12.4 g/dL (ref 12.0–15.0)
MCH: 31 pg (ref 26.0–34.0)
MCH: 31.6 pg (ref 26.0–34.0)
MCHC: 35.6 g/dL (ref 30.0–36.0)
MCHC: 35.6 g/dL (ref 30.0–36.0)
MCV: 87 fL (ref 78.0–100.0)
MCV: 88.5 fL (ref 78.0–100.0)
Platelets: 465 10*3/uL — ABNORMAL HIGH (ref 150–400)
Platelets: 396 10*3/uL (ref 150–400)
RBC: 4.07 MIL/uL (ref 3.87–5.11)
RBC: 3.93 MIL/uL (ref 3.87–5.11)
RDW: 13.2 % (ref 11.5–15.5)
RDW: 13.2 % (ref 11.5–15.5)
WBC: 9.2 10*3/uL (ref 4.0–10.5)
WBC: 8.3 10*3/uL (ref 4.0–10.5)

## 2012-03-05 LAB — URINE MICROSCOPIC-ADD ON

## 2012-03-05 LAB — TSH: TSH: 1.303 u[IU]/mL (ref 0.350–4.500)

## 2012-03-05 LAB — HIV ANTIBODY (ROUTINE TESTING W REFLEX): HIV: NONREACTIVE

## 2012-03-05 LAB — MAGNESIUM: Magnesium: 1.6 mg/dL (ref 1.5–2.5)

## 2012-03-05 LAB — MRSA PCR SCREENING: MRSA by PCR: NEGATIVE

## 2012-03-05 SURGERY — LEFT HEART CATH
Anesthesia: Moderate Sedation

## 2012-03-05 SURGERY — LEFT HEART CATHETERIZATION WITH CORONARY ANGIOGRAM
Anesthesia: LOCAL

## 2012-03-05 MED ORDER — SPIRONOLACTONE 12.5 MG HALF TABLET
12.5000 mg | ORAL_TABLET | Freq: Every day | ORAL | Status: DC
  Administered 2012-03-05 – 2012-03-06 (×2): 12.5 mg via ORAL
  Filled 2012-03-05 (×2): qty 1

## 2012-03-05 MED ORDER — HYDRALAZINE HCL 20 MG/ML IJ SOLN
10.0000 mg | Freq: Four times a day (QID) | INTRAMUSCULAR | Status: DC | PRN
  Filled 2012-03-05: qty 0.5

## 2012-03-05 MED ORDER — SODIUM CHLORIDE 0.9 % IV SOLN: 250.0000 mL | INTRAVENOUS | Status: DC | PRN

## 2012-03-05 MED ORDER — LIDOCAINE HCL (PF) 1 % IJ SOLN
INTRAMUSCULAR | Status: AC
  Filled 2012-03-05: qty 30

## 2012-03-05 MED ORDER — SODIUM CHLORIDE 0.9 % IJ SOLN: 3.0000 mL | INTRAMUSCULAR | Status: DC | PRN

## 2012-03-05 MED ORDER — HYDROCODONE-ACETAMINOPHEN 5-325 MG PO TABS
1.0000 | ORAL_TABLET | Freq: Four times a day (QID) | ORAL | Status: DC | PRN
  Administered 2012-03-05: 2 via ORAL
  Filled 2012-03-05: qty 2

## 2012-03-05 MED ORDER — ASPIRIN EC 81 MG PO TBEC
81.0000 mg | DELAYED_RELEASE_TABLET | Freq: Every day | ORAL | Status: DC
  Administered 2012-03-06: 81 mg via ORAL
  Filled 2012-03-05 (×2): qty 1

## 2012-03-05 MED ORDER — FENTANYL CITRATE 0.05 MG/ML IJ SOLN
INTRAMUSCULAR | Status: AC
  Filled 2012-03-05: qty 2

## 2012-03-05 MED ORDER — ENALAPRIL MALEATE 5 MG PO TABS: 5.0000 mg | ORAL_TABLET | Freq: Two times a day (BID) | ORAL | Status: DC

## 2012-03-05 MED ORDER — NITROGLYCERIN 1 MG/10 ML FOR IR/CATH LAB
INTRA_ARTERIAL | Status: AC
  Filled 2012-03-05: qty 10

## 2012-03-05 MED ORDER — ACETAMINOPHEN 325 MG PO TABS: 650.0000 mg | ORAL_TABLET | ORAL | Status: DC | PRN

## 2012-03-05 MED ORDER — FUROSEMIDE 10 MG/ML IJ SOLN
40.0000 mg | Freq: Two times a day (BID) | INTRAMUSCULAR | Status: DC
Start: 2012-03-05 — End: 2012-03-05
  Administered 2012-03-05: 40 mg via INTRAVENOUS
  Filled 2012-03-05 (×3): qty 4

## 2012-03-05 MED ORDER — ENALAPRIL MALEATE 5 MG PO TABS
5.0000 mg | ORAL_TABLET | Freq: Two times a day (BID) | ORAL | Status: DC
  Administered 2012-03-05 – 2012-03-06 (×3): 5 mg via ORAL
  Filled 2012-03-05 (×4): qty 1

## 2012-03-05 MED ORDER — HEPARIN SODIUM (PORCINE) 5000 UNIT/ML IJ SOLN
5000.0000 [IU] | Freq: Three times a day (TID) | INTRAMUSCULAR | Status: DC
  Administered 2012-03-05 – 2012-03-06 (×3): 5000 [IU] via SUBCUTANEOUS
  Filled 2012-03-05 (×6): qty 1

## 2012-03-05 MED ORDER — CARVEDILOL 6.25 MG PO TABS
6.2500 mg | ORAL_TABLET | Freq: Two times a day (BID) | ORAL | Status: DC
  Administered 2012-03-05 – 2012-03-06 (×3): 6.25 mg via ORAL
  Filled 2012-03-05 (×5): qty 1

## 2012-03-05 MED ORDER — MIDAZOLAM HCL 2 MG/2ML IJ SOLN
INTRAMUSCULAR | Status: AC
  Filled 2012-03-05: qty 2

## 2012-03-05 MED ORDER — CARVEDILOL 6.25 MG PO TABS: 6.2500 mg | ORAL_TABLET | Freq: Two times a day (BID) | ORAL | Status: DC

## 2012-03-05 MED ORDER — ONDANSETRON HCL 4 MG/2ML IJ SOLN: 4.0000 mg | Freq: Four times a day (QID) | INTRAMUSCULAR | Status: DC | PRN

## 2012-03-05 MED ORDER — NITROGLYCERIN 2 % TD OINT
1.0000 [in_us] | TOPICAL_OINTMENT | Freq: Three times a day (TID) | TRANSDERMAL | Status: DC
  Administered 2012-03-05 – 2012-03-06 (×4): 1 [in_us] via TOPICAL
  Filled 2012-03-05: qty 30

## 2012-03-05 MED ORDER — SODIUM CHLORIDE 0.9 % IV SOLN
250.0000 mL | INTRAVENOUS | Status: DC | PRN
  Administered 2012-03-05: 11:00:00 250 mL via INTRAVENOUS

## 2012-03-05 MED ORDER — ONDANSETRON HCL 4 MG/2ML IJ SOLN
4.0000 mg | Freq: Four times a day (QID) | INTRAMUSCULAR | Status: DC | PRN
  Administered 2012-03-05: 06:00:00 4 mg via INTRAVENOUS
  Filled 2012-03-05: qty 2

## 2012-03-05 MED ORDER — INSULIN ASPART 100 UNIT/ML ~~LOC~~ SOLN
3.0000 [IU] | Freq: Three times a day (TID) | SUBCUTANEOUS | Status: DC
  Administered 2012-03-06: 08:00:00 3 [IU] via SUBCUTANEOUS

## 2012-03-05 MED ORDER — INSULIN GLARGINE 100 UNIT/ML ~~LOC~~ SOLN
40.0000 [IU] | Freq: Every day | SUBCUTANEOUS | Status: DC
  Administered 2012-03-05 (×2): 40 [IU] via SUBCUTANEOUS

## 2012-03-05 MED ORDER — HEPARIN SODIUM (PORCINE) 5000 UNIT/ML IJ SOLN
5000.0000 [IU] | Freq: Three times a day (TID) | INTRAMUSCULAR | Status: DC
  Filled 2012-03-05 (×3): qty 1

## 2012-03-05 MED ORDER — INSULIN ASPART 100 UNIT/ML ~~LOC~~ SOLN: 0.0000 [IU] | Freq: Every day | SUBCUTANEOUS | Status: DC

## 2012-03-05 MED ORDER — INSULIN ASPART 100 UNIT/ML ~~LOC~~ SOLN
0.0000 [IU] | Freq: Three times a day (TID) | SUBCUTANEOUS | Status: DC
  Administered 2012-03-05: 09:00:00 5 [IU] via SUBCUTANEOUS
  Administered 2012-03-05: 17:00:00 1 [IU] via SUBCUTANEOUS
  Administered 2012-03-05: 11:00:00 3 [IU] via SUBCUTANEOUS
  Administered 2012-03-06: 08:00:00 1 [IU] via SUBCUTANEOUS

## 2012-03-05 MED ORDER — NICOTINE 14 MG/24HR TD PT24
14.0000 mg | MEDICATED_PATCH | Freq: Every day | TRANSDERMAL | Status: DC
  Administered 2012-03-05 – 2012-03-06 (×2): 14 mg via TRANSDERMAL
  Filled 2012-03-05 (×2): qty 1

## 2012-03-05 MED ORDER — SODIUM CHLORIDE 0.9 % IJ SOLN: 3.0000 mL | Freq: Two times a day (BID) | INTRAMUSCULAR | Status: DC

## 2012-03-05 MED ORDER — ENALAPRILAT 1.25 MG/ML IV SOLN
1.2500 mg | Freq: Once | INTRAVENOUS | Status: AC
  Administered 2012-03-05: 1.25 mg via INTRAVENOUS
  Filled 2012-03-05: qty 1

## 2012-03-05 MED ORDER — POTASSIUM CHLORIDE 20 MEQ PO PACK: 40.0000 meq | PACK | Freq: Once | ORAL | Status: DC

## 2012-03-05 MED ORDER — POTASSIUM CHLORIDE CRYS ER 20 MEQ PO TBCR
40.0000 meq | EXTENDED_RELEASE_TABLET | Freq: Once | ORAL | Status: AC
  Administered 2012-03-05: 40 meq via ORAL
  Filled 2012-03-05: qty 2

## 2012-03-05 MED ORDER — ASPIRIN 81 MG PO CHEW
324.0000 mg | CHEWABLE_TABLET | ORAL | Status: AC
  Administered 2012-03-05: 324 mg via ORAL
  Filled 2012-03-05: qty 4

## 2012-03-05 MED ORDER — FUROSEMIDE 10 MG/ML IJ SOLN
40.0000 mg | Freq: Three times a day (TID) | INTRAMUSCULAR | Status: DC
  Administered 2012-03-05 – 2012-03-06 (×3): 40 mg via INTRAVENOUS
  Filled 2012-03-05 (×6): qty 4

## 2012-03-05 MED ORDER — SODIUM CHLORIDE 0.9 % IJ SOLN
3.0000 mL | Freq: Two times a day (BID) | INTRAMUSCULAR | Status: DC
  Administered 2012-03-05: 3 mL via INTRAVENOUS

## 2012-03-05 MED ORDER — HEPARIN (PORCINE) IN NACL 2-0.9 UNIT/ML-% IJ SOLN
INTRAMUSCULAR | Status: AC
  Filled 2012-03-05: qty 2000

## 2012-03-05 NOTE — Progress Notes (Signed)
Utilization Review Completed Noa Constante J. Thatcher Doberstein, RN, BSN, NCM [phone]  

## 2012-03-05 NOTE — Progress Notes (Signed)
Inpatient Diabetes Program Recommendations  AACE/ADA: New Consensus Statement on Inpatient Glycemic Control (2013)  Target Ranges:  Prepandial:   less than 140 mg/dL      Peak postprandial:   less than 180 mg/dL (1-2 hours)      Critically ill patients:  140 - 180 mg/dL   Reason for Visit:Results for [name], [name] (MRN [id number]) as of 03/05/2012 09:20  Ref. Range 03/04/2012 18:23 03/05/2012 01:04 03/05/2012 07:55  Glucose-Capillary Latest Range: 70-99 mg/dL 409 (H) 811 (H) 914 (H)   Note CBG's elevated.  Please order A1C to determine pre-hospitalization glycemic control.  Agree with restart of Lantus.  Will follow.

## 2012-03-05 NOTE — CV Procedure (Signed)
   Cardiac Catheterization Procedure Note  Name: [name] MRN: [id number] DOB: 01-18-1951  Procedure: Left Heart Cath, Selective Coronary Angiography, LV angiography  Indication: Exertional dyspnea and chest pain, CHF, low EF with wall motion abnormalities.    Procedural Details: Allen's test was positive.  The right wrist was prepped, draped, and anesthetized with 1% lidocaine. Using the modified Seldinger technique, a 5 French sheath was introduced into the right radial artery. 3 mg of verapamil was administered through the sheath, weight-based unfractionated heparin was administered intravenously. Standard Judkins catheters were used for selective coronary angiography and left ventriculography. Catheter exchanges were performed over an exchange length guidewire. There were no immediate procedural complications. A TR band was used for radial hemostasis at the completion of the procedure.  The patient was transferred to the post catheterization recovery area for further monitoring.  Procedural Findings: Hemodynamics: AO 146/91 LV 148/34  Coronary angiography: Coronary dominance: right  Left mainstem: Short, no significant disease.  Left anterior descending (LAD): Mild luminal irregularities.   Left circumflex (LCx): Mild luminal irregularities.   Right coronary artery (RCA): 30-40% mid RCA stenosis.   Left ventriculography: Not done due to elevated LVEDP.  See echo with EF 20-25%.   Final Conclusions:  Nonischemic cardiomyopathy.  Elevated LVEDP.  Tortuous vessels suggestive of long-standing hypertension.  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  Needs ongoing diuresis.  Will also add spironolactone 12.5 mg daily.  Titrate up cardiac medications over the next couple of days.  Will also check HIV and serum/urine immunofixation given nonischemic CMP.   Marca Ancona 03/05/2012, 12:34 PM

## 2012-03-05 NOTE — Progress Notes (Signed)
Notified by Sunday Spillers that MD [facility] no longer needs ABG on this patient.

## 2012-03-05 NOTE — Progress Notes (Signed)
  Echocardiogram 2D Echocardiogram has been performed.  [name] 03/05/2012, 11:18 AM

## 2012-03-05 NOTE — Progress Notes (Signed)
Patient ID: [name], female   DOB: 10-02-1950, 62 y.o.   MRN: [id number]    SUBJECTIVE: Breathing better with diuresis, no further chest pain.  Patient reports about 2 weeks of severe exertional dyspnea (any walking) and orthopnea.  She has also been having chest pain episodes with exertion.  CTA chest negative for PA.  CEs so far negative.      Marland Kitchen aspirin EC  81 mg Oral Daily  . carvedilol  6.25 mg Oral BID WC  . enalapril  5 mg Oral BID  . furosemide  40 mg Intravenous Q8H  . heparin  5,000 Units Subcutaneous Q8H  . insulin aspart  0-5 Units Subcutaneous QHS  . insulin aspart  0-9 Units Subcutaneous TID WC  . insulin aspart  3 Units Subcutaneous TID WC  . insulin glargine  40 Units Subcutaneous QHS  . nitroGLYCERIN  1 inch Topical Q8H  . potassium chloride  40 mEq Oral Once  . sodium chloride  3 mL Intravenous Q12H      Filed Vitals:   03/05/12 0100 03/05/12 0300 03/05/12 0500 03/05/12 0750  BP: 169/108 149/77  137/77  Pulse: 114 109  94  Temp:  97.5 F (36.4 C)  97.9 F (36.6 C)  TempSrc:  Oral  Oral  Resp: 8 20  19   Height: 5\' 7"  (1.702 m)     Weight: 199 lb 4.7 oz (90.4 kg)  198 lb 13.7 oz (90.2 kg)   SpO2: 98% 96%  96%    Intake/Output Summary (Last 24 hours) at 03/05/12 0832 Last data filed at 03/05/12 4098  Gross per 24 hour  Intake      0 ml  Output   1650 ml  Net  -1650 ml    LABS: Basic Metabolic Panel:  Basename 03/05/12 0712 03/04/12 2046 03/04/12 1911  NA 139 137 --  K 3.3* 3.2* --  CL 97 98 --  CO2 33* -- 30  GLUCOSE 348* 335* --  BUN 10 12 --  CREATININE 0.61 0.70 --  CALCIUM 9.2 -- 9.4  MG 1.6 -- --  PHOS -- -- --   Liver Function Tests: No results found for this basename: AST:2,ALT:2,ALKPHOS:2,BILITOT:2,PROT:2,ALBUMIN:2 in the last 72 hours  Basename 03/04/12 1911  LIPASE 11  AMYLASE --   CBC:  Basename 03/05/12 0712 03/04/12 2046 03/04/12 1911  WBC 9.2 -- 8.5  NEUTROABS -- -- 6.1  HGB 12.6 13.6 --  HCT 35.4* 40.0 --    MCV 87.0 -- 86.7  PLT 465* -- 505*   Cardiac Enzymes:  Basename 03/05/12 1191 03/05/12 0113  CKTOTAL -- --  CKMB -- --  CKMBINDEX -- --  TROPONINI <0.30 <0.30   BNP: No components found with this basename: POCBNP:3 D-Dimer: No results found for this basename: DDIMER:2 in the last 72 hours Hemoglobin A1C: No results found for this basename: HGBA1C in the last 72 hours Fasting Lipid Panel: No results found for this basename: CHOL,HDL,LDLCALC,TRIG,CHOLHDL,LDLDIRECT in the last 72 hours Thyroid Function Tests: No results found for this basename: TSH,T4TOTAL,FREET3,T3FREE,THYROIDAB in the last 72 hours Anemia Panel: No results found for this basename: VITAMINB12,FOLATE,FERRITIN,TIBC,IRON,RETICCTPCT in the last 72 hours  RADIOLOGY: Ct Angio Chest Pe W/cm &/or Wo Cm  03/04/2012  *RADIOLOGY REPORT*  Clinical Data: Severe shortness of breath, cough, and chest pain.  CT ANGIOGRAPHY CHEST  Technique:  Multidetector CT imaging of the chest using the standard protocol during bolus administration of intravenous contrast. Multiplanar reconstructed images including MIPs were obtained and reviewed  to evaluate the vascular anatomy.  Contrast: 60mL OMNIPAQUE IOHEXOL 350 MG/ML SOLN  Comparison: None.  Findings: Technically adequate study with good opacification of the central and segmental pulmonary arteries.  No focal filling defects are demonstrated.  No evidence of significant pulmonary embolus.  Cardiac enlargement.  No pericardial effusions.  Normal caliber thoracic aorta.  No significant lymphadenopathy in the chest.  The esophagus is decompressed.  There are small bilateral pleural effusions, greater on the right side, with atelectasis or consolidation in both lung bases.  There are multi focal patchy perihilar and peripheral areas of airspace disease throughout both lungs.  This pattern may represent multifocal pneumonia or edema.  Nodular infiltrative process not excluded.  No pneumothorax.  Airways  appear patent.  Visualized portions of the upper abdominal organs are unremarkable. Degenerative changes in the thoracic spine with normal alignment.  IMPRESSION: No evidence of significant pulmonary embolus.  Cardiac enlargement with pulmonary vascular congestion.  Bilateral pleural effusions. Bibasilar atelectasis.  Patchy areas of airspace disease diffusely throughout both lungs.  Findings likely represent edema or multifocal pneumonia.   Original Report Authenticated By: Burman Nieves, M.D.    Dg Chest Port 1 View  03/04/2012  *RADIOLOGY REPORT*  Clinical Data: Chest pain, cough and shortness of breath.  PORTABLE CHEST - 1 VIEW  Comparison: PA and lateral chest 01/26/2012.  Findings: There is indistinctness of the pulmonary vasculature compatible with interstitial edema.  Heart size is enlarged.  No pneumothorax is identified.  There may be small bilateral pleural effusions and basilar atelectasis.  Study is somewhat limited by portable technique and the patient's body habitus.  IMPRESSION: Mild interstitial edema with possible small bilateral pleural effusions and basilar atelectasis.   Original Report Authenticated By: Holley Dexter, M.D.    Dg Abd Portable 1v  03/04/2012  *RADIOLOGY REPORT*  Clinical Data: Abdominal pain.  PORTABLE ABDOMEN - 1 VIEW  Comparison: CT abdomen and pelvis 01/26/2012.  Findings: The bowel gas pattern is unremarkable.  No focal bony abnormality is identified.  Bilateral hip replacements are seen.  IMPRESSION: No acute finding.   Original Report Authenticated By: Holley Dexter, M.D.     PHYSICAL EXAM General: NAD Neck:JVP 12 cm, no thyromegaly or thyroid nodule.  Lungs: Clear to auscultation bilaterally with normal respiratory effort. CV: Nondisplaced PMI.  Heart regular S1/S2, +S4, no murmur.  No peripheral edema.  No carotid bruit.  Normal pedal pulses.  Abdomen: Soft, nontender, no hepatosplenomegaly, no distention.  Neurologic: Alert and oriented x 3.  Psych:  Normal affect. Extremities: No clubbing or cyanosis.   TELEMETRY: Reviewed telemetry pt in NSR with LBBB  ASSESSMENT AND PLAN:  62 yo with history of HTN, DM, and smoking presents with 2 weeks of exertional chest pain and dyspnea.  She had pulmonary edema on CXR, no PE on CT, elevated BNP and is volume overloaded on exam.  Troponin negative x 2.  1. CHF: Acute CHF, ? Systolic versus diastolic.  She remains volume overloaded but is less orthopneic this morning after getting Lasix.  Etiology uncertain: could be uncontrolled HTN (BP very high at admission) but also given exertional chest pain and risk factors, high risk for CAD.  - Echo today - Continue enalapril - Increase Lasix to 40 mg IV every 8 hrs.  2. CAD: ? CAD as cause of exertional chest pain and CHF.  Needs evaluation.  I think she is high risk given presentation/symptoms, h/o DM, smoking and HTN.   - LHC today, radial access. -  Continue ASA.  3. HTN: BP better controlled now, very high overnight.  Continue enalapril, think we can add Coreg for BP lowering at 6.25 mg bid.  4. DM: Continue control. 5. Smoking: Quit 1 week ago.  Marca Ancona 03/05/2012 8:37 AM

## 2012-03-05 NOTE — Progress Notes (Deleted)
Utilization Review Completed Meryl Ponder J. Elic Vencill, RN, BSN, NCM [phone]  

## 2012-03-06 ENCOUNTER — Encounter (HOSPITAL_COMMUNITY): Payer: Self-pay | Admitting: *Deleted

## 2012-03-06 DIAGNOSIS — I428 Other cardiomyopathies: Secondary | ICD-10-CM

## 2012-03-06 DIAGNOSIS — I2510 Atherosclerotic heart disease of native coronary artery without angina pectoris: Secondary | ICD-10-CM

## 2012-03-06 DIAGNOSIS — I10 Essential (primary) hypertension: Secondary | ICD-10-CM

## 2012-03-06 DIAGNOSIS — E876 Hypokalemia: Secondary | ICD-10-CM

## 2012-03-06 DIAGNOSIS — Z720 Tobacco use: Secondary | ICD-10-CM

## 2012-03-06 DIAGNOSIS — I119 Hypertensive heart disease without heart failure: Secondary | ICD-10-CM

## 2012-03-06 DIAGNOSIS — R0902 Hypoxemia: Secondary | ICD-10-CM

## 2012-03-06 DIAGNOSIS — I5043 Acute on chronic combined systolic (congestive) and diastolic (congestive) heart failure: Secondary | ICD-10-CM

## 2012-03-06 DIAGNOSIS — E119 Type 2 diabetes mellitus without complications: Secondary | ICD-10-CM

## 2012-03-06 LAB — BASIC METABOLIC PANEL
BUN: 12 mg/dL (ref 6–23)
CO2: 35 mEq/L — ABNORMAL HIGH (ref 19–32)
Calcium: 9.4 mg/dL (ref 8.4–10.5)
Chloride: 97 mEq/L (ref 96–112)
Creatinine, Ser: 0.61 mg/dL (ref 0.50–1.10)
GFR calc Af Amer: >90 mL/min (ref >90)
GFR calc non Af Amer: >90 mL/min (ref >90)
Glucose, Bld: 125 mg/dL — ABNORMAL HIGH (ref 70–99)
Potassium: 3.4 mEq/L — ABNORMAL LOW (ref 3.5–5.1)
Sodium: 141 mEq/L (ref 135–145)

## 2012-03-06 LAB — CBC
HCT: 36.4 % (ref 36.0–46.0)
Hemoglobin: 12.7 g/dL (ref 12.0–15.0)
MCH: 31.1 pg (ref 26.0–34.0)
MCHC: 34.9 g/dL (ref 30.0–36.0)
MCV: 89 fL (ref 78.0–100.0)
Platelets: 484 10*3/uL — ABNORMAL HIGH (ref 150–400)
RBC: 4.09 MIL/uL (ref 3.87–5.11)
RDW: 13.2 % (ref 11.5–15.5)
WBC: 8.2 10*3/uL (ref 4.0–10.5)

## 2012-03-06 LAB — GLUCOSE, CAPILLARY: Glucose-Capillary: 121 mg/dL — ABNORMAL HIGH (ref 70–99)

## 2012-03-06 MED ORDER — ASPIRIN 81 MG PO TBEC: 81.0000 mg | DELAYED_RELEASE_TABLET | Freq: Every day | ORAL | Status: DC

## 2012-03-06 MED ORDER — ENALAPRIL MALEATE 5 MG PO TABS: 5.0000 mg | ORAL_TABLET | Freq: Two times a day (BID) | ORAL | Status: DC

## 2012-03-06 MED ORDER — NICOTINE 7 MG/24HR TD PT24: 1.0000 | MEDICATED_PATCH | TRANSDERMAL | Status: AC

## 2012-03-06 MED ORDER — ISOSORBIDE MONONITRATE ER 30 MG PO TB24: 30.0000 mg | ORAL_TABLET | Freq: Every day | ORAL | Status: DC

## 2012-03-06 MED ORDER — NICOTINE 14 MG/24HR TD PT24: 1.0000 | MEDICATED_PATCH | Freq: Every day | TRANSDERMAL | Status: AC

## 2012-03-06 MED ORDER — SPIRONOLACTONE 12.5 MG HALF TABLET: 12.5000 mg | ORAL_TABLET | Freq: Every day | ORAL | Status: DC

## 2012-03-06 MED ORDER — CARVEDILOL 6.25 MG PO TABS: 6.2500 mg | ORAL_TABLET | Freq: Two times a day (BID) | ORAL | Status: DC

## 2012-03-06 MED ORDER — LIVING WELL WITH DIABETES BOOK
Freq: Once | Status: AC
  Administered 2012-03-06: 09:00:00
  Filled 2012-03-06: qty 1

## 2012-03-06 MED ORDER — POTASSIUM CHLORIDE CRYS ER 20 MEQ PO TBCR
40.0000 meq | EXTENDED_RELEASE_TABLET | Freq: Once | ORAL | Status: AC
  Administered 2012-03-06: 11:00:00 40 meq via ORAL
  Filled 2012-03-06: qty 2

## 2012-03-06 MED ORDER — HYDRALAZINE HCL 25 MG PO TABS: 25.0000 mg | ORAL_TABLET | Freq: Two times a day (BID) | ORAL | Status: DC

## 2012-03-06 MED ORDER — FUROSEMIDE 40 MG PO TABS: 40.0000 mg | ORAL_TABLET | Freq: Every day | ORAL | Status: DC

## 2012-03-06 MED ORDER — METFORMIN HCL 1000 MG PO TABS: 1000.0000 mg | ORAL_TABLET | Freq: Every evening | ORAL | Status: DC

## 2012-03-06 MED ORDER — ATORVASTATIN CALCIUM 20 MG PO TABS: 20.0000 mg | ORAL_TABLET | Freq: Every day | ORAL | Status: DC

## 2012-03-06 MED ORDER — POTASSIUM CHLORIDE ER 10 MEQ PO TBCR: 20.0000 meq | EXTENDED_RELEASE_TABLET | Freq: Two times a day (BID) | ORAL | Status: DC

## 2012-03-06 NOTE — Progress Notes (Signed)
Pt sob w/ amb to bathroom <10 ft.  When awake o2 sat on 4L per [place] is 95-100%.  When o2 removed, o2 sat drops to 79% w/ activity in room.  When asleep, o2 sat varies greatly.  Sometimes drops to 87% on 4L w/ unlabored resp 16-20/min.  Other times maintains 92-95%.  No sleep apnea noted but does appear to breath through mouth.

## 2012-03-06 NOTE — Discharge Summary (Signed)
Discharge Summary   Patient ID: [name],  MRN: [id number], DOB/AGE: December 02, 1950 62 y.o.  Admit date: 03/04/2012 Discharge date: 03/06/2012  Primary Physician: Provider Not In System Primary Cardiologist: New - seen by Dr. Shirlee Latch this admission  Discharge Diagnoses Principal Problem:   Acute on chronic combined systolic and diastolic heart failure Active Problems:   Hypoxia   Hypertensive heart disease   Nonischemic cardiomyopathy   Tobacco abuse   Type 2 diabetes mellitus   Essential hypertension   CAD (coronary artery disease), native coronary artery   Hypokalemia  Allergies Allergies  Allergen Reactions  . Morphine And Related Nausea And Vomiting    Severe nausea   Diagnostic Studies/Procedures  PORTABLE CHEST X-RAY - 03/04/12  IMPRESSION:  Mild interstitial edema with possible small bilateral pleural effusions and basilar atelectasis.  CT-ANGIO CHEST - 03/04/12  IMPRESSION:  No evidence of significant pulmonary embolus. Cardiac enlargement with pulmonary vascular congestion. Bilateral pleural effusions. Bibasilar atelectasis. Patchy areas of airspace disease diffusely throughout both lungs. Findings likely represent edema or multifocal pneumonia.  TRANSTHORACIC ECHOCARDIOGRAM - 03/05/12  Impressions:  - Normal LV size with moderate LV hypertrophy. EF 20-25% with the above described wall motion abnormalities. Normal RV size and systolic function. No significant valvular abnormalities.  CARDIAC CATHETERIZATION - 03/05/12  Hemodynamics:  AO 146/91  LV 148/34  Coronary angiography:  Coronary dominance: right  Left mainstem: Short, no significant disease.  Left anterior descending (LAD): Mild luminal irregularities.  Left circumflex (LCx): Mild luminal irregularities.  Right coronary artery (RCA): 30-40% mid RCA stenosis.  Left ventriculography: Not done due to elevated LVEDP. See echo with EF 20-25%.  Final Conclusions: Nonischemic cardiomyopathy. Elevated  LVEDP. Tortuous vessels suggestive of long-standing hypertension. 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 Needs ongoing diuresis. Will also add spironolactone 12.5 mg daily. Titrate up cardiac medications over the next couple of days. Will also check HIV and serum/urine immunofixation given nonischemic CMP.   History of Present Illness [name] is a 62 y.o. female who was admitted to [facility] on 03/04/12 with the above problem list. She has a prior history of hypertension, diabetes mellitus and tobacco abuse.  The date of presentation, she reported progressively worsening dyspnea on exertion, PND orthopnea. She also noted midsternal chest pain which lasted 1-2 minutes over the prior 3-4 days. The symptoms had not occurred previously. She denied increased salt intake or any over-the-counter medication use. She did report being out of her Lasix for about one to 2 months. She also noted a recent viral illness. In the ED, EKG revealed sinus tachycardia and left bundle branch block. Initial point-of-care troponin returned within normal limits. Pro BNP was mildly elevated. As above, portable chest x-ray did reveal mild interstitial edema. A CT angiogram of the chest, as noted above, revealed no evidence of PE, but features consistent with CHF. This was suspected to be secondary to uncontrolled hypertension, however given her cardiac risk factors the plan was made to admit and proceed with diagnostic cardiac catheterization.  Hospital Course   She was started on an enalapril, Coreg and IV Lasix. She diuresis well and her symptoms improved. She underwent TTE which revealed LVEF 20-25%, moderate LVH , inferior and basal to mid septal akinesis, anterior moderate to severe hypokinesis and grade 2 diastolic dysfunction. She was informed, consented and prepped for cardiac catheterization. As above, this revealed nonobstructive CAD, elevated LVEDP and tortuous vessels suggestive of long-standing hypertension.  The suspicion was that the patient had a nonischemic cardiomyopathy, most  likely attributed to hypertensive heart disease. She tolerated the procedure well without complications. Diuresis was continued and spironolactone was added. She remained stable overnight. HIV returned nonreactive. TSH returned within normal limits. She did develop mild hypoxia with minimal exertion. She was evaluated by Dr. Ladona Ridgel this morning and deemed stable for discharge. Home health oxygen will be arranged. She will be discharged on the medications outlined below. She will followup in the office in </= 7 days given CHF status. Of note, she did have episodes of hypokalemia which were successfully supplemented. Potassium supplementation will be deferred initially as an ACE inhibitor and spironolactone were added. A basic metabolic panel and blood pressure will be evaluated on followup. Heart failure medications will be up titrated as tolerated. She will need a repeat echocardiogram in approximately 3 months to reassess LVEF, and need for ICD prophylaxis. This information, including supplemental CHF material, has been clearly outlined in the discharge AVS  Discharge Vitals:  Blood pressure 129/80, pulse 82, temperature 97.7 F (36.5 C), temperature source Oral, resp. rate 17, height 5\' 7"  (1.702 m), weight 95 kg (209 lb 7 oz), SpO2 100.00%.   Weight change: 4.6 kg (10 lb 2.3 oz)  Labs: Recent Labs     03/05/12  1449  03/06/12  0645  WBC  8.3  8.2  HGB  12.4  12.7  HCT  34.8*  36.4  MCV  88.5  89.0  PLT  396  484*   Recent Labs Lab 03/04/12 1911  03/05/12 0712 03/05/12 1449 03/06/12 0645  NA 137  < > 139 137 141  K 3.5  < > 3.3* 3.6 3.4*  CL 97  < > 97 97 97  CO2 30  --  33* 28 35*  BUN 12  < > 10 11 12   CREATININE 0.58  < > 0.61 0.65 0.61  CALCIUM 9.4  --  9.2 8.8 9.4  LIPASE 11  --   --   --   --   GLUCOSE 346*  < > 348* 215* 125*  < > = values in this interval not displayed. No results found for this  basename: HGBA1C,  in the last 72 hours Recent Labs     03/05/12  0113  03/05/12  0712  TROPONINI  <0.30  <0.30    Recent Labs  03/05/12 0712  TSH 1.303    Disposition:  Discharge Orders   Future Orders Complete By Expires     Diet - low sodium heart healthy  As directed     Increase activity slowly  As directed           Follow-up Information   Follow up with [facility]. (Office will call you with an appointment date & time. )    Contact information:   [address] [place] [address]       Please follow up. (Please establish/follow-up with your primary care provider for post-hospital follow-up in 1-2 weeks. )      Discharge Medications:    Medication List    STOP taking these medications       atenolol 50 MG tablet  Commonly known as:  TENORMIN      TAKE these medications       aspirin 81 MG EC tablet  Take 1 tablet (81 mg total) by mouth daily.     atorvastatin 20 MG tablet  Commonly known as:  LIPITOR  Take 1 tablet (20 mg total) by mouth daily.  carvedilol 6.25 MG tablet  Commonly known as:  COREG  Take 1 tablet (6.25 mg total) by mouth 2 (two) times daily with a meal.     enalapril 5 MG tablet  Commonly known as:  VASOTEC  Take 1 tablet (5 mg total) by mouth 2 (two) times daily.     furosemide 40 MG tablet  Commonly known as:  LASIX  Take 1 tablet (40 mg total) by mouth daily.     glimepiride 2 MG tablet  Commonly known as:  AMARYL  Take 2 mg by mouth daily before breakfast.     hydrALAZINE 25 MG tablet  Commonly known as:  APRESOLINE  Take 1 tablet (25 mg total) by mouth 2 (two) times daily.     HYDROcodone-acetaminophen 5-325 MG per tablet  Commonly known as:  NORCO/VICODIN  Take 1-2 tablets by mouth every 6 (six) hours as needed for pain.     insulin glargine 100 UNIT/ML injection  Commonly known as:  LANTUS  Inject 40 Units into the skin at bedtime.     isosorbide mononitrate 30 MG 24 hr tablet    Commonly known as:  IMDUR  Take 1 tablet (30 mg total) by mouth daily.     metFORMIN 1000 MG tablet  Commonly known as:  GLUCOPHAGE  Take 1 tablet (1,000 mg total) by mouth every evening.  Start taking on:  03/08/2012     nicotine 14 mg/24hr patch  Commonly known as:  NICODERM CQ - dosed in mg/24 hours  Place 1 patch onto the skin daily.     nicotine 7 mg/24hr patch  Commonly known as:  NICODERM CQ  Place 1 patch onto the skin daily.  Start taking on:  04/04/2012     oxyCODONE-acetaminophen 5-325 MG per tablet  Commonly known as:  PERCOCET/ROXICET  Take 1 tablet by mouth every 4 (four) hours as needed for pain.     spironolactone 12.5 mg Tabs  Commonly known as:  ALDACTONE  Take 0.5 tablets (12.5 mg total) by mouth daily.       Outstanding Labs/Studies: BMET in ~ 1 week  Duration of Discharge Encounter: Greater than 30 minutes including physician time.  Signed, R. Hurman Horn, PA-C 03/06/2012, 10:07 AM

## 2012-03-06 NOTE — Progress Notes (Signed)
NCM contacted pt and states she has a aide that is through [facility]. She is not sure what services. NCM discussed HH and offered choice for HH. Contacted [employer] rep with referral. Faxed referral for [facility] RN. They will do soc on 03/07/2012. [facility] will deliver pt's portable oxygen to her room. NCM explained to pt to contact [facility] as soon as she arrives home to deliver oxygen. Placed [facility] and [facility] contact info on dc instructions. Isidoro Donning RN CCM Case Mgmt phone [phone]

## 2012-03-06 NOTE — Plan of Care (Signed)
Problem: Consults Goal: Tobacco Cessation referral if indicated Outcome: Completed/Met Date Met:  03/06/12 Discussed smoking cessation, tips for success sheet and 1-800 quit # given.  Pt states ready to quit.  Wearing nicotine patch.

## 2012-03-06 NOTE — Plan of Care (Signed)
Problem: Consults Goal: Heart Failure Patient Education (See Patient Education module for education specifics.) Outcome: Progressing Pt demonstrates no knowledge of CHF principles, states never had problems until recently.  Reviewed CHF packet w/ weight chart, S/S, low salt diet, when to call MD.  Case management consult in for home f/u per CHF protocol.  Unable to wean O2, requires 5L at night to maintain O2 sat > 92%.  Pt refused to watch CHF video last night due to "haven't slept in 2 days."  Will try again this am.

## 2012-03-06 NOTE — Progress Notes (Signed)
Pt resting on bed on room air ,O2 sat drop to 77% ,  Place pt on O2 2l   O2 sat 87% , increase O2 to 4l/m [place] and O2 sat increase to 96 %

## 2012-03-06 NOTE — Progress Notes (Signed)
Patient ID: [name], female   DOB: 29-Mar-1950, 62 y.o.   MRN: [id number]      SUBJECTIVE: Breathing better with diuresis, no further chest pain.   Marland Kitchen aspirin EC  81 mg Oral Daily  . carvedilol  6.25 mg Oral BID WC  . enalapril  5 mg Oral BID  . furosemide  40 mg Intravenous Q8H  . heparin  5,000 Units Subcutaneous Q8H  . insulin aspart  0-5 Units Subcutaneous QHS  . insulin aspart  0-9 Units Subcutaneous TID WC  . insulin aspart  3 Units Subcutaneous TID WC  . insulin glargine  40 Units Subcutaneous QHS  . living well with diabetes book   Does not apply Once  . nicotine  14 mg Transdermal Daily  . nitroGLYCERIN  1 inch Topical Q8H  . sodium chloride  3 mL Intravenous Q12H  . spironolactone  12.5 mg Oral Daily      Filed Vitals:   03/05/12 2000 03/06/12 0000 03/06/12 0530 03/06/12 0729  BP: 129/75 105/55 123/78 129/80  Pulse: 84 79 82   Temp: 97.6 F (36.4 C) 97.6 F (36.4 C) 97.6 F (36.4 C) 97.7 F (36.5 C)  TempSrc: Oral Oral Oral Oral  Resp: 14 17 17 17   Height:      Weight:   209 lb 7 oz (95 kg)   SpO2: 96% 95% 100% 100%    Intake/Output Summary (Last 24 hours) at 03/06/12 0823 Last data filed at 03/06/12 0645  Gross per 24 hour  Intake    743 ml  Output   2575 ml  Net  -1832 ml    LABS: Basic Metabolic Panel:  Recent Labs  78/29/56 0712 03/05/12 1449 03/06/12 0645  NA 139 137 141  K 3.3* 3.6 3.4*  CL 97 97 97  CO2 33* 28 35*  GLUCOSE 348* 215* 125*  BUN 10 11 12   CREATININE 0.61 0.65 0.61  CALCIUM 9.2 8.8 9.4  MG 1.6  --   --    Liver Function Tests: No results found for this basename: AST, ALT, ALKPHOS, BILITOT, PROT, ALBUMIN,  in the last 72 hours  Recent Labs  03/04/12 1911  LIPASE 11   CBC:  Recent Labs  03/04/12 1911  03/05/12 1449 03/06/12 0645  WBC 8.5  < > 8.3 8.2  NEUTROABS 6.1  --   --   --   HGB 13.5  < > 12.4 12.7  HCT 37.1  < > 34.8* 36.4  MCV 86.7  < > 88.5 89.0  PLT 505*  < > 396 484*  < > = values in  this interval not displayed. Cardiac Enzymes:  Recent Labs  03/05/12 0113 03/05/12 0712  TROPONINI <0.30 <0.30   BNP: No components found with this basename: POCBNP,  D-Dimer: No results found for this basename: DDIMER,  in the last 72 hours Hemoglobin A1C: No results found for this basename: HGBA1C,  in the last 72 hours Fasting Lipid Panel: No results found for this basename: CHOL, HDL, LDLCALC, TRIG, CHOLHDL, LDLDIRECT,  in the last 72 hours Thyroid Function Tests:  Recent Labs  03/05/12 0712  TSH 1.303   Anemia Panel: No results found for this basename: VITAMINB12, FOLATE, FERRITIN, TIBC, IRON, RETICCTPCT,  in the last 72 hours  RADIOLOGY: Ct Angio Chest Pe W/cm &/or Wo Cm  03/04/2012  *RADIOLOGY REPORT*  Clinical Data: Severe shortness of breath, cough, and chest pain.  CT ANGIOGRAPHY CHEST  Technique:  Multidetector CT imaging of  the chest using the standard protocol during bolus administration of intravenous contrast. Multiplanar reconstructed images including MIPs were obtained and reviewed to evaluate the vascular anatomy.  Contrast: 60mL OMNIPAQUE IOHEXOL 350 MG/ML SOLN  Comparison: None.  Findings: Technically adequate study with good opacification of the central and segmental pulmonary arteries.  No focal filling defects are demonstrated.  No evidence of significant pulmonary embolus.  Cardiac enlargement.  No pericardial effusions.  Normal caliber thoracic aorta.  No significant lymphadenopathy in the chest.  The esophagus is decompressed.  There are small bilateral pleural effusions, greater on the right side, with atelectasis or consolidation in both lung bases.  There are multi focal patchy perihilar and peripheral areas of airspace disease throughout both lungs.  This pattern may represent multifocal pneumonia or edema.  Nodular infiltrative process not excluded.  No pneumothorax.  Airways appear patent.  Visualized portions of the upper abdominal organs are unremarkable.  Degenerative changes in the thoracic spine with normal alignment.  IMPRESSION: No evidence of significant pulmonary embolus.  Cardiac enlargement with pulmonary vascular congestion.  Bilateral pleural effusions. Bibasilar atelectasis.  Patchy areas of airspace disease diffusely throughout both lungs.  Findings likely represent edema or multifocal pneumonia.   Original Report Authenticated By: Burman Nieves, M.D.    Dg Chest Port 1 View  03/04/2012  *RADIOLOGY REPORT*  Clinical Data: Chest pain, cough and shortness of breath.  PORTABLE CHEST - 1 VIEW  Comparison: PA and lateral chest 01/26/2012.  Findings: There is indistinctness of the pulmonary vasculature compatible with interstitial edema.  Heart size is enlarged.  No pneumothorax is identified.  There may be small bilateral pleural effusions and basilar atelectasis.  Study is somewhat limited by portable technique and the patient's body habitus.  IMPRESSION: Mild interstitial edema with possible small bilateral pleural effusions and basilar atelectasis.   Original Report Authenticated By: Holley Dexter, M.D.    Dg Abd Portable 1v  03/04/2012  *RADIOLOGY REPORT*  Clinical Data: Abdominal pain.  PORTABLE ABDOMEN - 1 VIEW  Comparison: CT abdomen and pelvis 01/26/2012.  Findings: The bowel gas pattern is unremarkable.  No focal bony abnormality is identified.  Bilateral hip replacements are seen.  IMPRESSION: No acute finding.   Original Report Authenticated By: Holley Dexter, M.D.     PHYSICAL EXAM General: NAD Neck:JVP 8 cm, no thyromegaly or thyroid nodule.  Lungs: Clear to auscultation bilaterally with normal respiratory effort. CV: Nondisplaced PMI.  Heart regular S1/S2, +S4, no murmur.  No peripheral edema.  No carotid bruit.  Normal pedal pulses.  Abdomen: Soft, nontender, no hepatosplenomegaly, no distention.  Neurologic: Alert and oriented x 3.  Psych: Normal affect. Extremities: No clubbing or cyanosis.   TELEMETRY: Reviewed  telemetry pt in NSR with LBBB  ASSESSMENT AND PLAN:  62 yo with history of HTN, DM, and smoking presents with 2 weeks of exertional chest pain and dyspnea.  She had pulmonary edema on CXR, no PE on CT, elevated BNP and is volume overloaded on exam.  Troponin negative x 2.  1. Acute systolic CHF 2. Non-ischemic CM 3. HTN: BP better controlled on medical therapy 4. DM: Continue control. 5. Smoking: Quit 1 week ago.  Rec: ok for discharge today. She will go home on her current meds with plans for uptitration as an outpatient. Swith lasix to 40 mg po daily. Would use oral hydralazine 25 mg twice daily and imdur 30 mg daily. Gregg Taylor,M.D.  03/06/2012 8:23 AM

## 2012-03-07 NOTE — Progress Notes (Signed)
03/07/2012 1000 NCM faxed referral to [facility]. [facility] unable to accept due to insurance coverage. Isidoro Donning RN CCM Case Mgmt phone [phone]

## 2012-03-09 LAB — UIFE/LIGHT CHAINS/TP QN, 24-HR UR
Albumin, U: DETECTED
Alpha 1, Urine: DETECTED — AB
Alpha 2, Urine: DETECTED — AB
Beta, Urine: DETECTED — AB
Free Kappa Lt Chains,Ur: 1.11 mg/dL (ref 0.14–2.42)
Free Kappa/Lambda Ratio: 5.29 ratio (ref 2.04–10.37)
Free Lambda Lt Chains,Ur: 0.21 mg/dL (ref 0.02–0.67)
Gamma Globulin, Urine: DETECTED — AB
Total Protein, Urine: 1.8 mg/dL

## 2012-03-09 LAB — IMMUNOFIXATION ELECTROPHORESIS
IgA: 315 mg/dL (ref 69–380)
IgG (Immunoglobin G), Serum: 1280 mg/dL (ref 690–1700)
IgM, Serum: 110 mg/dL (ref 52–322)
Total Protein ELP: 6.5 g/dL (ref 6.0–8.3)

## 2012-03-11 ENCOUNTER — Telehealth: Payer: Self-pay | Admitting: Cardiology

## 2012-03-11 NOTE — Telephone Encounter (Signed)
New Problem:    Attempted to call patient on home# and was told by a female that it was the wrong number, cell# only had a busy signal.

## 2012-03-12 ENCOUNTER — Encounter: Payer: PRIVATE HEALTH INSURANCE | Admitting: Nurse Practitioner

## 2012-03-12 ENCOUNTER — Other Ambulatory Visit: Payer: PRIVATE HEALTH INSURANCE

## 2012-06-14 ENCOUNTER — Other Ambulatory Visit: Payer: Self-pay | Admitting: Obstetrics

## 2012-06-14 DIAGNOSIS — D259 Leiomyoma of uterus, unspecified: Secondary | ICD-10-CM

## 2012-06-23 ENCOUNTER — Ambulatory Visit
Admission: RE | Admit: 2012-06-23 | Discharge: 2012-06-23 | Disposition: A | Discharge status: Home / Self Care | Payer: PRIVATE HEALTH INSURANCE | Source: Ambulatory Visit | Attending: Obstetrics | Admitting: Obstetrics

## 2012-06-23 ENCOUNTER — Other Ambulatory Visit: Payer: Self-pay | Admitting: Emergency Medicine

## 2012-06-23 DIAGNOSIS — E119 Type 2 diabetes mellitus without complications: Secondary | ICD-10-CM

## 2012-06-23 DIAGNOSIS — D259 Leiomyoma of uterus, unspecified: Secondary | ICD-10-CM

## 2012-06-23 LAB — BUN: BUN: 21 mg/dL (ref 6–23)

## 2012-06-23 LAB — CREATININE WITH EST GFR
Creat: 0.89 mg/dL (ref 0.50–1.10)
GFR, Est African American: 82 mL/min
GFR, Est Non African American: 71 mL/min

## 2012-06-24 ENCOUNTER — Other Ambulatory Visit: Payer: Self-pay | Admitting: Obstetrics

## 2012-06-24 DIAGNOSIS — R102 Pelvic and perineal pain: Secondary | ICD-10-CM

## 2012-06-24 DIAGNOSIS — D259 Leiomyoma of uterus, unspecified: Secondary | ICD-10-CM

## 2012-07-05 ENCOUNTER — Other Ambulatory Visit: Payer: Self-pay | Admitting: Internal Medicine

## 2012-07-05 ENCOUNTER — Ambulatory Visit
Admission: RE | Admit: 2012-07-05 | Discharge: 2012-07-05 | Disposition: A | Discharge status: Home / Self Care | Payer: PRIVATE HEALTH INSURANCE | Source: Ambulatory Visit | Attending: Obstetrics | Admitting: Obstetrics

## 2012-07-05 DIAGNOSIS — R102 Pelvic and perineal pain: Secondary | ICD-10-CM

## 2012-07-05 DIAGNOSIS — Z1231 Encounter for screening mammogram for malignant neoplasm of breast: Secondary | ICD-10-CM

## 2012-07-05 DIAGNOSIS — D259 Leiomyoma of uterus, unspecified: Secondary | ICD-10-CM

## 2012-07-05 MED ORDER — GADOBENATE DIMEGLUMINE 529 MG/ML IV SOLN
20.0000 mL | Freq: Once | INTRAVENOUS | Status: AC | PRN
  Administered 2012-07-05: 20 mL via INTRAVENOUS

## 2012-08-05 ENCOUNTER — Ambulatory Visit: Payer: PRIVATE HEALTH INSURANCE

## 2012-08-10 ENCOUNTER — Emergency Department (HOSPITAL_COMMUNITY): Payer: PRIVATE HEALTH INSURANCE

## 2012-08-10 ENCOUNTER — Inpatient Hospital Stay (HOSPITAL_COMMUNITY)
Admission: EM | Admit: 2012-08-10 | Discharge: 2012-08-12 | DRG: 293 | Disposition: A | Discharge status: Home / Self Care | Payer: PRIVATE HEALTH INSURANCE | Attending: Cardiology | Admitting: Cardiology

## 2012-08-10 ENCOUNTER — Encounter (HOSPITAL_COMMUNITY): Payer: Self-pay | Admitting: Emergency Medicine

## 2012-08-10 DIAGNOSIS — I2510 Atherosclerotic heart disease of native coronary artery without angina pectoris: Secondary | ICD-10-CM | POA: Diagnosis present

## 2012-08-10 DIAGNOSIS — I447 Left bundle-branch block, unspecified: Secondary | ICD-10-CM | POA: Diagnosis present

## 2012-08-10 DIAGNOSIS — E876 Hypokalemia: Secondary | ICD-10-CM | POA: Diagnosis present

## 2012-08-10 DIAGNOSIS — I10 Essential (primary) hypertension: Secondary | ICD-10-CM

## 2012-08-10 DIAGNOSIS — I498 Other specified cardiac arrhythmias: Secondary | ICD-10-CM | POA: Diagnosis present

## 2012-08-10 DIAGNOSIS — Z9119 Patient's noncompliance with other medical treatment and regimen: Secondary | ICD-10-CM

## 2012-08-10 DIAGNOSIS — I5023 Acute on chronic systolic (congestive) heart failure: Secondary | ICD-10-CM

## 2012-08-10 DIAGNOSIS — I059 Rheumatic mitral valve disease, unspecified: Secondary | ICD-10-CM

## 2012-08-10 DIAGNOSIS — D649 Anemia, unspecified: Secondary | ICD-10-CM | POA: Diagnosis present

## 2012-08-10 DIAGNOSIS — Z794 Long term (current) use of insulin: Secondary | ICD-10-CM

## 2012-08-10 DIAGNOSIS — I5043 Acute on chronic combined systolic (congestive) and diastolic (congestive) heart failure: Secondary | ICD-10-CM

## 2012-08-10 DIAGNOSIS — E119 Type 2 diabetes mellitus without complications: Secondary | ICD-10-CM | POA: Diagnosis present

## 2012-08-10 DIAGNOSIS — Z91199 Patient's noncompliance with other medical treatment and regimen due to unspecified reason: Secondary | ICD-10-CM

## 2012-08-10 DIAGNOSIS — Z79899 Other long term (current) drug therapy: Secondary | ICD-10-CM

## 2012-08-10 DIAGNOSIS — I43 Cardiomyopathy in diseases classified elsewhere: Secondary | ICD-10-CM | POA: Diagnosis present

## 2012-08-10 DIAGNOSIS — F17200 Nicotine dependence, unspecified, uncomplicated: Secondary | ICD-10-CM | POA: Diagnosis present

## 2012-08-10 DIAGNOSIS — I509 Heart failure, unspecified: Secondary | ICD-10-CM | POA: Diagnosis present

## 2012-08-10 DIAGNOSIS — I110 Hypertensive heart disease with heart failure: Principal | ICD-10-CM | POA: Diagnosis present

## 2012-08-10 HISTORY — DX: Anemia, unspecified: D64.9

## 2012-08-10 HISTORY — DX: Patient's noncompliance with other medical treatment and regimen: Z91.19

## 2012-08-10 HISTORY — DX: Left bundle-branch block, unspecified: I44.7

## 2012-08-10 HISTORY — DX: Patient's noncompliance with other medical treatment and regimen due to unspecified reason: Z91.199

## 2012-08-10 LAB — CBC
HCT: 31.6 % — ABNORMAL LOW (ref 36.0–46.0)
Hemoglobin: 11.1 g/dL — ABNORMAL LOW (ref 12.0–15.0)
MCH: 31.1 pg (ref 26.0–34.0)
MCHC: 35.1 g/dL (ref 30.0–36.0)
MCV: 88.5 fL (ref 78.0–100.0)
Platelets: 391 10*3/uL (ref 150–400)
RBC: 3.57 MIL/uL — ABNORMAL LOW (ref 3.87–5.11)
RDW: 13 % (ref 11.5–15.5)
WBC: 9.5 10*3/uL (ref 4.0–10.5)

## 2012-08-10 LAB — TROPONIN I
Troponin I: <0.3 ng/mL (ref <0.30)
Troponin I: <0.3 ng/mL (ref <0.30)

## 2012-08-10 LAB — CBC WITH DIFFERENTIAL/PLATELET
Basophils Absolute: 0 10*3/uL (ref 0.0–0.1)
Basophils Relative: 1 % (ref 0–1)
Eosinophils Absolute: 0.1 10*3/uL (ref 0.0–0.7)
Eosinophils Relative: 1 % (ref 0–5)
HCT: 33 % — ABNORMAL LOW (ref 36.0–46.0)
Hemoglobin: 11.5 g/dL — ABNORMAL LOW (ref 12.0–15.0)
Lymphocytes Relative: 19 % (ref 12–46)
Lymphs Abs: 1.6 10*3/uL (ref 0.7–4.0)
MCH: 30.8 pg (ref 26.0–34.0)
MCHC: 34.8 g/dL (ref 30.0–36.0)
MCV: 88.5 fL (ref 78.0–100.0)
Monocytes Absolute: 0.5 10*3/uL (ref 0.1–1.0)
Monocytes Relative: 7 % (ref 3–12)
Neutro Abs: 6.1 10*3/uL (ref 1.7–7.7)
Neutrophils Relative %: 73 % (ref 43–77)
Platelets: 419 10*3/uL — ABNORMAL HIGH (ref 150–400)
RBC: 3.73 MIL/uL — ABNORMAL LOW (ref 3.87–5.11)
RDW: 13.1 % (ref 11.5–15.5)
WBC: 8.3 10*3/uL (ref 4.0–10.5)

## 2012-08-10 LAB — COMPREHENSIVE METABOLIC PANEL
ALT: 21 U/L (ref 0–35)
AST: 26 U/L (ref 0–37)
Albumin: 3.7 g/dL (ref 3.5–5.2)
Alkaline Phosphatase: 81 U/L (ref 39–117)
BUN: 11 mg/dL (ref 6–23)
CO2: 26 mEq/L (ref 19–32)
Calcium: 9.4 mg/dL (ref 8.4–10.5)
Chloride: 101 mEq/L (ref 96–112)
Creatinine, Ser: 0.71 mg/dL (ref 0.50–1.10)
GFR calc Af Amer: >90 mL/min (ref >90)
GFR calc non Af Amer: >90 mL/min (ref >90)
Glucose, Bld: 275 mg/dL — ABNORMAL HIGH (ref 70–99)
Potassium: 3.6 mEq/L (ref 3.5–5.1)
Sodium: 138 mEq/L (ref 135–145)
Total Bilirubin: 0.6 mg/dL (ref 0.3–1.2)
Total Protein: 7.3 g/dL (ref 6.0–8.3)

## 2012-08-10 LAB — POCT I-STAT, CHEM 8
BUN: 11 mg/dL (ref 6–23)
Calcium, Ion: 1.16 mmol/L (ref 1.12–1.23)
Chloride: 102 mEq/L (ref 96–112)
Creatinine, Ser: 0.9 mg/dL (ref 0.50–1.10)
Glucose, Bld: 274 mg/dL — ABNORMAL HIGH (ref 70–99)
HCT: 35 % — ABNORMAL LOW (ref 36.0–46.0)
Hemoglobin: 11.9 g/dL — ABNORMAL LOW (ref 12.0–15.0)
Potassium: 3.6 mEq/L (ref 3.5–5.1)
Sodium: 141 mEq/L (ref 135–145)
TCO2: 25 mmol/L (ref 0–100)

## 2012-08-10 LAB — POCT I-STAT TROPONIN I: Troponin i, poc: 0.02 ng/mL (ref 0.00–0.08)

## 2012-08-10 LAB — PRO B NATRIURETIC PEPTIDE: Pro B Natriuretic peptide (BNP): 2944 pg/mL — ABNORMAL HIGH (ref 0–125)

## 2012-08-10 LAB — LIPASE, BLOOD: Lipase: 10 U/L — ABNORMAL LOW (ref 11–59)

## 2012-08-10 LAB — GLUCOSE, CAPILLARY
Glucose-Capillary: 253 mg/dL — ABNORMAL HIGH (ref 70–99)
Glucose-Capillary: 155 mg/dL — ABNORMAL HIGH (ref 70–99)

## 2012-08-10 LAB — CREATININE, SERUM
Creatinine, Ser: 0.64 mg/dL (ref 0.50–1.10)
GFR calc Af Amer: >90 mL/min (ref >90)
GFR calc non Af Amer: >90 mL/min (ref >90)

## 2012-08-10 LAB — MAGNESIUM: Magnesium: 1.6 mg/dL (ref 1.5–2.5)

## 2012-08-10 MED ORDER — ATORVASTATIN CALCIUM 40 MG PO TABS
40.0000 mg | ORAL_TABLET | Freq: Every day | ORAL | Status: DC
  Administered 2012-08-10 – 2012-08-12 (×3): 40 mg via ORAL
  Filled 2012-08-10 (×3): qty 1

## 2012-08-10 MED ORDER — SODIUM CHLORIDE 0.9 % IJ SOLN
3.0000 mL | Freq: Two times a day (BID) | INTRAMUSCULAR | Status: DC
  Administered 2012-08-10 – 2012-08-12 (×4): 3 mL via INTRAVENOUS

## 2012-08-10 MED ORDER — ISOSORBIDE MONONITRATE ER 30 MG PO TB24
30.0000 mg | ORAL_TABLET | Freq: Every day | ORAL | Status: DC
  Administered 2012-08-10 – 2012-08-12 (×3): 30 mg via ORAL
  Filled 2012-08-10 (×3): qty 1

## 2012-08-10 MED ORDER — ALPRAZOLAM 0.25 MG PO TABS: 0.2500 mg | ORAL_TABLET | Freq: Two times a day (BID) | ORAL | Status: DC | PRN

## 2012-08-10 MED ORDER — ENOXAPARIN SODIUM 40 MG/0.4ML ~~LOC~~ SOLN
40.0000 mg | SUBCUTANEOUS | Status: DC
  Administered 2012-08-10 – 2012-08-11 (×2): 40 mg via SUBCUTANEOUS
  Filled 2012-08-10 (×3): qty 0.4

## 2012-08-10 MED ORDER — SPIRONOLACTONE 12.5 MG HALF TABLET
12.5000 mg | ORAL_TABLET | Freq: Every day | ORAL | Status: DC
  Administered 2012-08-10 – 2012-08-12 (×3): 12.5 mg via ORAL
  Filled 2012-08-10 (×3): qty 1

## 2012-08-10 MED ORDER — OXYCODONE-ACETAMINOPHEN 5-325 MG PO TABS
1.0000 | ORAL_TABLET | ORAL | Status: DC | PRN
  Administered 2012-08-10 – 2012-08-12 (×3): 1 via ORAL
  Filled 2012-08-10 (×3): qty 1

## 2012-08-10 MED ORDER — HYDRALAZINE HCL 20 MG/ML IJ SOLN
10.0000 mg | Freq: Four times a day (QID) | INTRAMUSCULAR | Status: DC | PRN
  Filled 2012-08-10: qty 0.5

## 2012-08-10 MED ORDER — ENALAPRIL MALEATE 5 MG PO TABS
5.0000 mg | ORAL_TABLET | Freq: Two times a day (BID) | ORAL | Status: DC
  Administered 2012-08-10 – 2012-08-12 (×4): 5 mg via ORAL
  Filled 2012-08-10 (×5): qty 1

## 2012-08-10 MED ORDER — ONDANSETRON HCL 4 MG/2ML IJ SOLN: 4.0000 mg | Freq: Four times a day (QID) | INTRAMUSCULAR | Status: DC | PRN

## 2012-08-10 MED ORDER — ZOLPIDEM TARTRATE 5 MG PO TABS
5.0000 mg | ORAL_TABLET | Freq: Every evening | ORAL | Status: DC | PRN
  Administered 2012-08-11 – 2012-08-12 (×2): 5 mg via ORAL
  Filled 2012-08-10 (×2): qty 1

## 2012-08-10 MED ORDER — INSULIN ASPART 100 UNIT/ML ~~LOC~~ SOLN
0.0000 [IU] | Freq: Three times a day (TID) | SUBCUTANEOUS | Status: DC
Start: 2012-08-10 — End: 2012-08-12
  Administered 2012-08-10: 5 [IU] via SUBCUTANEOUS
  Administered 2012-08-11: 3 [IU] via SUBCUTANEOUS
  Administered 2012-08-11: 2 [IU] via SUBCUTANEOUS
  Administered 2012-08-11 – 2012-08-12 (×2): 3 [IU] via SUBCUTANEOUS
  Administered 2012-08-12: 2 [IU] via SUBCUTANEOUS

## 2012-08-10 MED ORDER — FUROSEMIDE 10 MG/ML IJ SOLN
40.0000 mg | INTRAMUSCULAR | Status: AC
  Administered 2012-08-10: 40 mg via INTRAVENOUS
  Filled 2012-08-10: qty 4

## 2012-08-10 MED ORDER — CARVEDILOL 6.25 MG PO TABS
6.2500 mg | ORAL_TABLET | Freq: Two times a day (BID) | ORAL | Status: DC
  Administered 2012-08-10 – 2012-08-12 (×4): 6.25 mg via ORAL
  Filled 2012-08-10 (×6): qty 1

## 2012-08-10 MED ORDER — NITROGLYCERIN 0.4 MG SL SUBL: 0.4000 mg | SUBLINGUAL_TABLET | SUBLINGUAL | Status: DC | PRN

## 2012-08-10 MED ORDER — SODIUM CHLORIDE 0.9 % IV SOLN
250.0000 mL | INTRAVENOUS | Status: DC | PRN
Start: 2012-08-10 — End: 2012-08-12

## 2012-08-10 MED ORDER — SODIUM CHLORIDE 0.9 % IJ SOLN: 3.0000 mL | INTRAMUSCULAR | Status: DC | PRN

## 2012-08-10 MED ORDER — FUROSEMIDE 10 MG/ML IJ SOLN
40.0000 mg | Freq: Once | INTRAMUSCULAR | Status: AC
  Administered 2012-08-10: 40 mg via INTRAVENOUS
  Filled 2012-08-10: qty 4

## 2012-08-10 MED ORDER — NITROGLYCERIN IN D5W 200-5 MCG/ML-% IV SOLN
5.0000 ug/min | Freq: Once | INTRAVENOUS | Status: AC
  Administered 2012-08-10: 20 ug/min via INTRAVENOUS
  Filled 2012-08-10: qty 250

## 2012-08-10 MED ORDER — HYDRALAZINE HCL 25 MG PO TABS
25.0000 mg | ORAL_TABLET | Freq: Two times a day (BID) | ORAL | Status: DC
  Administered 2012-08-10 – 2012-08-12 (×4): 25 mg via ORAL
  Filled 2012-08-10 (×5): qty 1

## 2012-08-10 MED ORDER — ASPIRIN EC 81 MG PO TBEC
81.0000 mg | DELAYED_RELEASE_TABLET | Freq: Every day | ORAL | Status: DC
  Administered 2012-08-11 – 2012-08-12 (×2): 81 mg via ORAL
  Filled 2012-08-10 (×2): qty 1

## 2012-08-10 MED ORDER — ACETAMINOPHEN 325 MG PO TABS: 650.0000 mg | ORAL_TABLET | ORAL | Status: DC | PRN

## 2012-08-10 NOTE — ED Provider Notes (Signed)
History    CSN: [id number] Arrival date & time 08/10/12  8416  First MD Initiated Contact with Patient 08/10/12 0901     Chief Complaint  Patient presents with  . Chest Pain   (Consider location/radiation/quality/duration/timing/severity/associated sxs/prior Treatment) HPI Comments: 62 year old female with a history of congestive heart failure, diabetes, hypertension, presents with a complaint of shortness of breath. This started approximately 3 days ago, was gradual in onset, persistent, gradually worsening and is now severe. She has associated sweating, abdominal pain and distention but denies any peripheral edema. She notes that she has been off of her home oxygen for several days stating that her machine is not working right and she attributes her shortness of breath to this. She denies cough or fever. The patient is significantly cachectic and has difficulty speaking in full sentences because of her shortness of breath.  Patient is a 62 y.o. female presenting with chest pain. The history is provided by the patient and medical records.  Chest Pain  Past Medical History  Diagnosis Date  . Diabetes mellitus   . Hypertension   . CAD (coronary artery disease), native coronary artery     Nonobstructive   . Tobacco abuse   . Chronic combined systolic and diastolic CHF (congestive heart failure)     a. 03/05/12 echo:  LVEF 20-25%, moderate LVH , inferior and basal to mid septal akinesis, anterior moderate to severe hypokinesis and grade 2 diastolic dysfunction   Past Surgical History  Procedure Laterality Date  . Joint replacement      Bilateral hip and right knee  . Cardiac catheterization  03/04/12    nonobstructive CAD, elevated LVEDP and tortuous vessels suggestive of long-standing hypertension   No family history on file. History  Substance Use Topics  . Smoking status: Former Smoker -- 1.00 packs/day for 40 years    Types: Cigarettes    Start date: 06/23/1972    Quit date:  03/26/2012  . Smokeless tobacco: Not on file  . Alcohol Use: No   OB History   Grav Para Term Preterm Abortions TAB SAB Ect Mult Living                 Review of Systems  Cardiovascular: Positive for chest pain.  All other systems reviewed and are negative.    Allergies  Morphine and related  Home Medications   Current Outpatient Rx  Name  Route  Sig  Dispense  Refill  . atorvastatin (LIPITOR) 20 MG tablet   Oral   Take 1 tablet (20 mg total) by mouth daily.   30 tablet   3   . carvedilol (COREG) 6.25 MG tablet   Oral   Take 1 tablet (6.25 mg total) by mouth 2 (two) times daily with a meal.   60 tablet   3   . enalapril (VASOTEC) 5 MG tablet   Oral   Take 1 tablet (5 mg total) by mouth 2 (two) times daily.   60 tablet   3   . furosemide (LASIX) 40 MG tablet   Oral   Take 1 tablet (40 mg total) by mouth daily.   30 tablet   3   . glimepiride (AMARYL) 2 MG tablet   Oral   Take 2 mg by mouth daily before breakfast.         . hydrALAZINE (APRESOLINE) 25 MG tablet   Oral   Take 1 tablet (25 mg total) by mouth 2 (two) times daily.   60  tablet   3   . HYDROcodone-acetaminophen (NORCO/VICODIN) 5-325 MG per tablet   Oral   Take 1-2 tablets by mouth every 6 (six) hours as needed for pain.   17 tablet   0   . insulin glargine (LANTUS) 100 UNIT/ML injection   Subcutaneous   Inject 10-40 Units into the skin 2 (two) times daily. Pt takes 10 units in the morning and 40 at night         . isosorbide mononitrate (IMDUR) 30 MG 24 hr tablet   Oral   Take 1 tablet (30 mg total) by mouth daily.   30 tablet   3   . metFORMIN (GLUCOPHAGE) 1000 MG tablet   Oral   Take 1 tablet (1,000 mg total) by mouth every evening.         Marland Kitchen oxyCODONE-acetaminophen (PERCOCET/ROXICET) 5-325 MG per tablet   Oral   Take 1 tablet by mouth every 4 (four) hours as needed for pain.   20 tablet   0   . spironolactone (ALDACTONE) 12.5 mg TABS   Oral   Take 0.5 tablets (12.5  mg total) by mouth daily.   30 tablet   2    BP 170/99  Pulse 108  Temp(Src) 98.6 F (37 C) (Oral)  Resp 26  SpO2 100% Physical Exam  Nursing note and vitals reviewed. Constitutional: She appears well-developed and well-nourished. She appears distressed.  HENT:  Head: Normocephalic and atraumatic.  Mouth/Throat: Oropharynx is clear and moist. No oropharyngeal exudate.  Eyes: Conjunctivae and EOM are normal. Pupils are equal, round, and reactive to light. Right eye exhibits no discharge. Left eye exhibits no discharge. No scleral icterus.  Neck: Normal range of motion. Neck supple. No JVD present. No thyromegaly present.  Cardiovascular: Regular rhythm, normal heart sounds and intact distal pulses.  Exam reveals no gallop and no friction rub.   No murmur heard. Tachycardic, strong pulses at the radial arteries, and mild JVD  Pulmonary/Chest: She is in respiratory distress. She has no wheezes. She has rales ( Scattered occasional rales at the bases).  Increased work of breathing, mild accessory muscle use, tachypnea, speaks in short sentences  Abdominal: Soft. Bowel sounds are normal. She exhibits distension. She exhibits no mass. There is no tenderness.  Mild diffuse abdominal distention but no tympanitic sounds, tenderness only in the epigastrium. No masses palpated, no peritoneal signs  Musculoskeletal: Normal range of motion. She exhibits no edema and no tenderness.  No peripheral edema  Lymphadenopathy:    She has no cervical adenopathy.  Neurological: She is alert. Coordination normal.  Skin: Skin is warm. No rash noted. She is diaphoretic. No erythema.  Diaphoretic  Psychiatric: She has a normal mood and affect. Her behavior is normal.    ED Course  Procedures (including critical care time) Labs Reviewed  LIPASE, BLOOD - Abnormal; Notable for the following:    Lipase 10 (*)    All other components within normal limits  COMPREHENSIVE METABOLIC PANEL - Abnormal; Notable  for the following:    Glucose, Bld 275 (*)    All other components within normal limits  CBC WITH DIFFERENTIAL - Abnormal; Notable for the following:    RBC 3.73 (*)    Hemoglobin 11.5 (*)    HCT 33.0 (*)    Platelets 419 (*)    All other components within normal limits  PRO B NATRIURETIC PEPTIDE - Abnormal; Notable for the following:    Pro B Natriuretic peptide (BNP) 2944.0 (*)  All other components within normal limits  POCT I-STAT, CHEM 8 - Abnormal; Notable for the following:    Glucose, Bld 274 (*)    Hemoglobin 11.9 (*)    HCT 35.0 (*)    All other components within normal limits  POCT I-STAT TROPONIN I   Dg Chest Port 1 View  08/10/2012   *RADIOLOGY REPORT*  Clinical Data: Shortness of breath  PORTABLE CHEST - 1 VIEW  Comparison: 03/04/2012  Findings: Cardiomegaly again noted.  There is mild interstitial prominence bilaterally suspicious for mild interstitial edema. Probable bilateral small pleural effusion with bilateral basilar atelectasis.  IMPRESSION: There is mild interstitial prominence bilaterally suspicious for mild interstitial edema.  Probable bilateral small pleural effusion with bilateral basilar atelectasis.   Original Report Authenticated By: Natasha Mead, M.D.   1. CHF (congestive heart failure), acute on chronic, systolic   2. Hypertension     MDM  The patient is ill-appearing, she appears to be in moderate respiratory distress and is diaphoretic. Review of the medical record shows that the patient has congestive heart failure, she has an ejection fraction between 25% as of February 2014. Her EKG today shows a left bundle branch block with abnormal ST segments and T waves consistent with having left bundle branch block, she is tachycardic in what appears to be a sinus tachycardia, there are no other significant changes compared to February 2014. She will eat a chest x-ray and laboratory workup, supplemental oxygen has been given, I suspect that this is congestive  heart failure exacerbation. She is hypertensive at 185/115, give Lasix, nitroglycerin, reevaluate, anticipate admission.  ED ECG REPORT  I personally interpreted this EKG   Date: 08/10/2012   Rate: 109  Rhythm: sinus tachycardia  QRS Axis: left  Intervals: normal  ST/T Wave abnormalities: nonspecific ST/T changes  Conduction Disutrbances:left bundle branch block  Narrative Interpretation:   Old EKG Reviewed: unchanged  Patient reexamined, she still dyspneic but her vital signs are improving on a nitroglycerin drip with a slower heart rate and lower blood pressure. Her pulmonary edema has been treated with nitroglycerin to reduce her preload as well as Lasix for diuresis.  Laboratory workup shows baseline renal function, hyperglycemia, BNP of near 3000, mild anemia. I discussed the patient's care with the cardiology team on call who will admit the patient. She is critically ill with a poor ejection fraction requiring nitroglycerin drip at this time.  CRITICAL CARE Performed by: Vida Roller Total critical care time: 30 Critical care time was exclusive of separately billable procedures and treating other patients. Critical care was necessary to treat or prevent imminent or life-threatening deterioration. Critical care was time spent personally by me on the following activities: development of treatment plan with patient and/or surrogate as well as nursing, discussions with consultants, evaluation of patient's response to treatment, examination of patient, obtaining history from patient or surrogate, ordering and performing treatments and interventions, ordering and review of laboratory studies, ordering and review of radiographic studies, pulse oximetry and re-evaluation of patient's condition.   Vida Roller, MD 08/10/12 [phone]

## 2012-08-10 NOTE — Progress Notes (Signed)
Patient is wanting her son to be able to look at her information on My Chart. Proxy forms given to patient and she verbalizes understanding of where to mail them back to. Briscoe Burns BSN, RN-BC Admissions RN  08/10/2012 12:04 PM

## 2012-08-10 NOTE — H&P (Signed)
History & Physical   Patient ID: [name] MRN: [id number], DOB/AGE: 1950-07-01   Admit date: 08/10/2012 Date of Consult: 08/10/2012  Primary Physician: Provider Not In System Primary Cardiologist: Golden Circle, MD  Reason for consult: CHF  HPI: [name] is a 62 y.o. 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   She was seen in 2/6 to 03/06/2012 due to progressively worsening DOE, PND and orthopnea. She had been w/o Lasix x 2 months. EKG revealed sinus tachycardia with LBBB. BNP was elevated. CXR inidicated findings c/w CXR. CT-A revealed no PE, confirmed CHF. She was started on ACEi, BB and diuresed with Lasix. Echo showed EF 20-25%, moderate LVH, grade 2 diastolic CHF, inferior and basal to mid septal akinesis, anterior moderate to severe hypokinesis. Cardiac cath was performed revealing nonobstructive CAD, elevated LVEDP and tortuous vessels suggestive of long-standing hypertension. She was diursed, started on spironolactone. She was set up for a follow-up appointment and labwork in 1 week which she did not attend. Note, consideration of repeat echo 3 months from then was advised to re-evaluate EF and consider ICD/CRT-D implantation.   She reports DOE progressing to rest dyspnea x 1 week with associated abdominal distention, LE edema, PND and orthopnea. She does report chest pressure w/o radiation. She reports running out of her Lasix for 3 days. She denies palpitations or syncope. She denies EtOH, tobacco abuse or elicit drug use. No fevers, chills or active bleeding.   In the ED, EKG revealed sinus tachycardia, LBBB. POC TnI 0.02. CMET unremarkable. CBC- Hgb 11.5/Hct 33/PLT 419. Pro BNP 2944. CXR revealed mild interstitial prominence bilaterally suspicious for mild interstitial edema, probable bilateral  small pleural effusions. Vital signs indicate HTN (203/169). Lasix 40mg  IV x 1, NTG gtt started.   Problem List: Past Medical History  Diagnosis Date  . Diabetes mellitus   . Hypertension   . CAD (coronary artery disease), native coronary artery     Nonobstructive   . Tobacco abuse   . Chronic combined systolic and diastolic CHF (congestive heart failure)     a. 03/05/12 echo:  LVEF 20-25%, moderate LVH , inferior and basal to mid septal akinesis, anterior moderate to severe hypokinesis and grade 2 diastolic dysfunction    Past Surgical History  Procedure Laterality Date  . Joint replacement      Bilateral hip and right knee  . Cardiac catheterization  03/04/12    nonobstructive CAD, elevated LVEDP and tortuous vessels suggestive of long-standing hypertension     Allergies:  Allergies  Allergen Reactions  . Morphine And Related Nausea And Vomiting    Severe nausea    Home Medications: Prior to Admission medications   Medication Sig Start Date End Date Taking? Authorizing Provider  atorvastatin (LIPITOR) 20 MG tablet Take 1 tablet (20 mg total) by mouth daily. 03/06/12  Yes Roger A Arguello, PA-C  carvedilol (COREG) 6.25 MG tablet Take 1 tablet (6.25 mg total) by mouth 2 (two) times daily with a meal. 03/06/12  Yes Roger A Arguello, PA-C  enalapril (VASOTEC) 5 MG tablet Take 1 tablet (5 mg total) by mouth 2 (two) times daily. 03/06/12  Yes Roger A Arguello, PA-C  furosemide (LASIX) 40 MG tablet Take 1 tablet (40 mg total) by mouth daily. 03/06/12  Yes Roger A Arguello, PA-C  glimepiride (AMARYL) 2  MG tablet Take 2 mg by mouth daily before breakfast.   Yes Historical Provider, MD  hydrALAZINE (APRESOLINE) 25 MG tablet Take 1 tablet (25 mg total) by mouth 2 (two) times daily. 03/06/12  Yes Roger A Arguello, PA-C  HYDROcodone-acetaminophen (NORCO/VICODIN) 5-325 MG per tablet Take 1-2 tablets by mouth every 6 (six) hours as needed for pain. 01/26/12  Yes John-Adam Bonk, MD  insulin glargine  (LANTUS) 100 UNIT/ML injection Inject 10-40 Units into the skin 2 (two) times daily. Pt takes 10 units in the morning and 40 at night   Yes Historical Provider, MD  isosorbide mononitrate (IMDUR) 30 MG 24 hr tablet Take 1 tablet (30 mg total) by mouth daily. 03/06/12  Yes Roger A Arguello, PA-C  metFORMIN (GLUCOPHAGE) 1000 MG tablet Take 1 tablet (1,000 mg total) by mouth every evening. 03/08/12  Yes Roger A Arguello, PA-C  oxyCODONE-acetaminophen (PERCOCET/ROXICET) 5-325 MG per tablet Take 1 tablet by mouth every 4 (four) hours as needed for pain. 01/29/12  Yes Catherine E Schinlever, PA-C  spironolactone (ALDACTONE) 12.5 mg TABS Take 0.5 tablets (12.5 mg total) by mouth daily. 03/06/12  Yes Gery Pray, PA-C    Inpatient Medications:   Prescriptions prior to admission  Medication Sig Dispense Refill  . atorvastatin (LIPITOR) 20 MG tablet Take 1 tablet (20 mg total) by mouth daily.  30 tablet  3  . carvedilol (COREG) 6.25 MG tablet Take 1 tablet (6.25 mg total) by mouth 2 (two) times daily with a meal.  60 tablet  3  . enalapril (VASOTEC) 5 MG tablet Take 1 tablet (5 mg total) by mouth 2 (two) times daily.  60 tablet  3  . furosemide (LASIX) 40 MG tablet Take 1 tablet (40 mg total) by mouth daily.  30 tablet  3  . glimepiride (AMARYL) 2 MG tablet Take 2 mg by mouth daily before breakfast.      . hydrALAZINE (APRESOLINE) 25 MG tablet Take 1 tablet (25 mg total) by mouth 2 (two) times daily.  60 tablet  3  . HYDROcodone-acetaminophen (NORCO/VICODIN) 5-325 MG per tablet Take 1-2 tablets by mouth every 6 (six) hours as needed for pain.  17 tablet  0  . insulin glargine (LANTUS) 100 UNIT/ML injection Inject 10-40 Units into the skin 2 (two) times daily. Pt takes 10 units in the morning and 40 at night      . isosorbide mononitrate (IMDUR) 30 MG 24 hr tablet Take 1 tablet (30 mg total) by mouth daily.  30 tablet  3  . metFORMIN (GLUCOPHAGE) 1000 MG tablet Take 1 tablet (1,000 mg total) by mouth every  evening.      Marland Kitchen oxyCODONE-acetaminophen (PERCOCET/ROXICET) 5-325 MG per tablet Take 1 tablet by mouth every 4 (four) hours as needed for pain.  20 tablet  0  . spironolactone (ALDACTONE) 12.5 mg TABS Take 0.5 tablets (12.5 mg total) by mouth daily.  30 tablet  2    Family History  Problem Relation Age of Onset  . Other      Denies family history of CAD     History   Social History  . Marital Status: Single    Spouse Name: N/A    Number of Children: N/A  . Years of Education: N/A   Occupational History  . Not on file.   Social History Main Topics  . Smoking status: Former Smoker -- 1.00 packs/day for 40 years    Types: Cigarettes    Start date: 06/23/1972  Quit date: 03/26/2012  . Smokeless tobacco: Never Used  . Alcohol Use: No  . Drug Use: No  . Sexually Active: Not on file   Other Topics Concern  . Not on file   Social History Narrative  . No narrative on file    Review of Systems: General: negative for chills, fever, night sweats or weight changes.  Cardiovascular: positive for chest pain, DOE, PND, shortness of breath, edema, orthopnea, palpitations Dermatological: negative for rash Respiratory: negative for cough or wheezing Urologic: negative for hematuria Abdominal: negative for nausea, vomiting, diarrhea, bright red blood per rectum, melena, or hematemesis Neurologic: negative for visual changes, syncope, or dizziness All other systems reviewed and are otherwise negative except as noted above.  Physical Exam: Blood pressure 157/124, pulse 94, temperature 98.6 F (37 C), temperature source Oral, resp. rate 16, SpO2 100.00%.    General: Well developed, well nourished, in no acute distress. Head: Normocephalic, atraumatic, sclera non-icteric, no xanthomas, nares are without discharge.  Neck: Negative for carotid bruits. JVP 8-9 cm.  Lungs: Fine bibasilar rales. No wheezes or rhonchi. Breathing is unlabored. Heart: RRR with S1 S2. No murmurs, rubs, or  gallops appreciated. Abdomen: Soft, non-tender, + abdominal distention, with normoactive bowel sounds. No hepatomegaly. No rebound/guarding. No obvious abdominal masses. Msk:  Strength and tone appears normal for age. Extremities: Trace bilateral pretibial edema. No clubbing or cyanosis.  Distal pedal pulses are 2+ and equal bilaterally. Neuro: Alert and oriented X 3. Moves all extremities spontaneously. Psych:  Responds to questions appropriately with a normal affect.  Labs: Recent Labs     08/10/12  0919  08/10/12  0934  WBC  8.3   --   HGB  11.5*  11.9*  HCT  33.0*  35.0*  MCV  88.5   --   PLT  419*   --     Recent Labs Lab 08/10/12 0919 08/10/12 0934  NA 138 141  K 3.6 3.6  CL 101 102  CO2 26  --   BUN 11 11  CREATININE 0.71 0.90  CALCIUM 9.4  --   PROT 7.3  --   BILITOT 0.6  --   ALKPHOS 81  --   ALT 21  --   AST 26  --   LIPASE 10*  --   GLUCOSE 275* 274*   Radiology/Studies: Dg Chest Port 1 View  08/10/2012   *RADIOLOGY REPORT*  Clinical Data: Shortness of breath  PORTABLE CHEST - 1 VIEW  Comparison: 03/04/2012  Findings: Cardiomegaly again noted.  There is mild interstitial prominence bilaterally suspicious for mild interstitial edema. Probable bilateral small pleural effusion with bilateral basilar atelectasis.  IMPRESSION: There is mild interstitial prominence bilaterally suspicious for mild interstitial edema.  Probable bilateral small pleural effusion with bilateral basilar atelectasis.   Original Report Authenticated By: Natasha Mead, M.D.   EKG: NSR, 109 bpm, LAD, LBBB  ASSESSMENT AND PLAN:   62 y.o. 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   1. Acute on chronic combined CHF/NICM 2. Hypertensive heart disease 3. Nonobstructive CAD 4. HTN 5. Type 2 DM  6. Tobacco  abuse 7. Medical noncompliance  The patient presents to the [facility] ED after experiencing DOE progressing to rest dyspnea with associated abdominal distention, PND, orthopnea and LE edema x 1 week. She has had chest pressure  most recently on exertion. She had been without Lasix for 3 days. Reports she has been compliant with her other outpatient medications. Objectively, BNP is elevated and CXR indicates pulmonary edema consistent with acute on chronic combined CHF. She has a NICM at baseline described above. Suspect hypertensive heart disease and ? Chronic interventricular dyssynchrony contributing.  Exacerbation secondary to medication noncompliance and uncontrolled hypertension. Will plan to admit for IV diuresis. Continue outpatient ACEi, BB, Imdur/hydralazine, spironolactone. Continue NTG gtt for now to aide with diuresis and provide BP support. Monitor I/Os, daily weights, serial BMETs. Place on heart healthy diet- 2g Na, 2 L. Repeat 2D echo. If < 35%, follow-up EP for CRT-D consideration (NYHA class III symptoms, QRS > 150 ms). Will need to adhere to follow-up appointments (arrange at discharge).    Signed, R. Hurman Horn, PA-C 08/10/2012, 1:36 PM    I have taken a history, reviewed medications, allergies, PMH, SH, FH, and reviewed ROS and examined the patient.  I agree with the assessment and plan. Admission secondary to medical noncompliance. Candid discussion with patient about not running out of meds. She promises to follow up in the office.  Denese Mentink C. Daleen Squibb, MD, [facility] Pager:  [phone]

## 2012-08-10 NOTE — Progress Notes (Signed)
  Echocardiogram 2D Echocardiogram has been performed.  [name] 08/10/2012, 3:16 PM

## 2012-08-10 NOTE — ED Notes (Signed)
Pt states the epigastric pain that radiates up her chest started 2-3 days ago.  Pt states that she vomited once this am when she woke up.  She said she had diarrhea on Sunday.  Pt has been out of her home O2 for 2-3 days. Pt states that she is swelling in her stomach.

## 2012-08-11 DIAGNOSIS — I5023 Acute on chronic systolic (congestive) heart failure: Secondary | ICD-10-CM

## 2012-08-11 LAB — BASIC METABOLIC PANEL
BUN: 10 mg/dL (ref 6–23)
CO2: 32 mEq/L (ref 19–32)
Calcium: 8.9 mg/dL (ref 8.4–10.5)
Chloride: 100 mEq/L (ref 96–112)
Creatinine, Ser: 0.69 mg/dL (ref 0.50–1.10)
GFR calc Af Amer: >90 mL/min (ref >90)
GFR calc non Af Amer: >90 mL/min (ref >90)
Glucose, Bld: 199 mg/dL — ABNORMAL HIGH (ref 70–99)
Potassium: 3.3 mEq/L — ABNORMAL LOW (ref 3.5–5.1)
Sodium: 140 mEq/L (ref 135–145)

## 2012-08-11 LAB — GLUCOSE, CAPILLARY
Glucose-Capillary: 228 mg/dL — ABNORMAL HIGH (ref 70–99)
Glucose-Capillary: 213 mg/dL — ABNORMAL HIGH (ref 70–99)
Glucose-Capillary: 195 mg/dL — ABNORMAL HIGH (ref 70–99)
Glucose-Capillary: 227 mg/dL — ABNORMAL HIGH (ref 70–99)

## 2012-08-11 LAB — LIPID PANEL
Cholesterol: 123 mg/dL (ref 0–200)
HDL: 40 mg/dL (ref >39)
LDL Cholesterol: 66 mg/dL (ref 0–99)
Total CHOL/HDL Ratio: 3.1 RATIO
Triglycerides: 84 mg/dL (ref <150)
VLDL: 17 mg/dL (ref 0–40)

## 2012-08-11 LAB — HEMOGLOBIN A1C
Hgb A1c MFr Bld: 9.9 % — ABNORMAL HIGH (ref <5.7)
Mean Plasma Glucose: 237 mg/dL — ABNORMAL HIGH (ref <117)

## 2012-08-11 LAB — T4, FREE: Free T4: 1.37 ng/dL (ref 0.80–1.80)

## 2012-08-11 LAB — TSH: TSH: 1.208 u[IU]/mL (ref 0.350–4.500)

## 2012-08-11 MED ORDER — SENNOSIDES-DOCUSATE SODIUM 8.6-50 MG PO TABS
1.0000 | ORAL_TABLET | Freq: Two times a day (BID) | ORAL | Status: DC | PRN
  Administered 2012-08-11: 1 via ORAL
  Filled 2012-08-11: qty 1

## 2012-08-11 MED ORDER — POTASSIUM CHLORIDE CRYS ER 20 MEQ PO TBCR
40.0000 meq | EXTENDED_RELEASE_TABLET | Freq: Once | ORAL | Status: AC
  Administered 2012-08-11: 40 meq via ORAL
  Filled 2012-08-11: qty 2

## 2012-08-11 MED ORDER — FUROSEMIDE 10 MG/ML IJ SOLN: 40.0000 mg | Freq: Once | INTRAMUSCULAR | Status: DC

## 2012-08-11 MED ORDER — FUROSEMIDE 40 MG PO TABS
40.0000 mg | ORAL_TABLET | Freq: Every day | ORAL | Status: DC
  Administered 2012-08-11 – 2012-08-12 (×2): 40 mg via ORAL
  Filled 2012-08-11 (×2): qty 1

## 2012-08-11 MED ORDER — POTASSIUM CHLORIDE CRYS ER 20 MEQ PO TBCR
40.0000 meq | EXTENDED_RELEASE_TABLET | Freq: Two times a day (BID) | ORAL | Status: DC
  Administered 2012-08-11: 40 meq via ORAL
  Filled 2012-08-11 (×3): qty 2

## 2012-08-11 NOTE — Progress Notes (Signed)
SUBJECTIVE:  She is breathing better and she denies chest pain.     PHYSICAL EXAM Filed Vitals:   08/10/12 2259 08/11/12 0442 08/11/12 0908 08/11/12 1334  BP: 140/80 130/74 126/74 137/72  Pulse:  82 80 93  Temp:  98.4 F (36.9 C)  98.1 F (36.7 C)  TempSrc:  Oral  Oral  Resp:  16  18  Height:      Weight:  217 lb 9.5 oz (98.7 kg)    SpO2:  96%  97%   General:  No distress Neck:  No JVD Lungs:  Clear Heart:  RRR Abdomen:  Positive bowel sounds, no rebound no guarding Extremities:  No edema.   LABS: Lab Results  Component Value Date   TROPONINI <0.30 08/10/2012   Results for orders placed during the hospital encounter of 08/10/12 (from the past 24 hour(s))  TROPONIN I     Status: None   Collection Time    08/10/12  8:10 PM      Result Value Range   Troponin I <0.30  <0.30 ng/mL  GLUCOSE, CAPILLARY     Status: Abnormal   Collection Time    08/10/12  9:22 PM      Result Value Range   Glucose-Capillary 155 (*) 70 - 99 mg/dL   Comment 1 Documented in Chart     Comment 2 Notify RN    LIPID PANEL     Status: None   Collection Time    08/11/12  5:20 AM      Result Value Range   Cholesterol 123  0 - 200 mg/dL   Triglycerides 84  <604 mg/dL   HDL 40  >54 mg/dL   Total CHOL/HDL Ratio 3.1     VLDL 17  0 - 40 mg/dL   LDL Cholesterol 66  0 - 99 mg/dL  BASIC METABOLIC PANEL     Status: Abnormal   Collection Time    08/11/12  5:20 AM      Result Value Range   Sodium 140  135 - 145 mEq/L   Potassium 3.3 (*) 3.5 - 5.1 mEq/L   Chloride 100  96 - 112 mEq/L   CO2 32  19 - 32 mEq/L   Glucose, Bld 199 (*) 70 - 99 mg/dL   BUN 10  6 - 23 mg/dL   Creatinine, Ser 0.98  0.50 - 1.10 mg/dL   Calcium 8.9  8.4 - 11.9 mg/dL   GFR calc non Af Amer >90  >90 mL/min   GFR calc Af Amer >90  >90 mL/min  GLUCOSE, CAPILLARY     Status: Abnormal   Collection Time    08/11/12  7:35 AM      Result Value Range   Glucose-Capillary 228 (*) 70 - 99 mg/dL  GLUCOSE, CAPILLARY     Status:  Abnormal   Collection Time    08/11/12 11:42 AM      Result Value Range   Glucose-Capillary 213 (*) 70 - 99 mg/dL  GLUCOSE, CAPILLARY     Status: Abnormal   Collection Time    08/11/12  5:00 PM      Result Value Range   Glucose-Capillary 195 (*) 70 - 99 mg/dL    Intake/Output Summary (Last 24 hours) at 08/11/12 1734 Last data filed at 08/11/12 1547  Gross per 24 hour  Intake    700 ml  Output   1950 ml  Net  -1250 ml    EKG:    ASSESSMENT  AND PLAN:  Acute on chronic combined CHF/NICM:  EF 25%.  Unchanged from previous.  Probably hypertensive.  Good urine output.  Start PO Lasix today.   HTN:  BP is now easily controlled on her current meds.    Type 2 DM :  On discharge she will continue her previous medications.    Tobacco abuse:  Educated.    Medical noncompliance:  She verbalizes the understanding for compliance.      Rollene Rotunda 08/11/2012 5:34 PM

## 2012-08-12 ENCOUNTER — Encounter (HOSPITAL_COMMUNITY): Payer: Self-pay | Admitting: Physician Assistant

## 2012-08-12 LAB — BASIC METABOLIC PANEL
BUN: 11 mg/dL (ref 6–23)
CO2: 32 mEq/L (ref 19–32)
Calcium: 9.3 mg/dL (ref 8.4–10.5)
Chloride: 97 mEq/L (ref 96–112)
Creatinine, Ser: 0.66 mg/dL (ref 0.50–1.10)
GFR calc Af Amer: >90 mL/min (ref >90)
GFR calc non Af Amer: >90 mL/min (ref >90)
Glucose, Bld: 226 mg/dL — ABNORMAL HIGH (ref 70–99)
Potassium: 3.9 mEq/L (ref 3.5–5.1)
Sodium: 137 mEq/L (ref 135–145)

## 2012-08-12 LAB — GLUCOSE, CAPILLARY: Glucose-Capillary: 170 mg/dL — ABNORMAL HIGH (ref 70–99)

## 2012-08-12 MED ORDER — VITAMINS A & D EX OINT
TOPICAL_OINTMENT | CUTANEOUS | Status: AC
  Administered 2012-08-12: 03:00:00
  Filled 2012-08-12: qty 10

## 2012-08-12 MED ORDER — MAGNESIUM SULFATE 40 MG/ML IJ SOLN
2.0000 g | Freq: Once | INTRAMUSCULAR | Status: AC
  Administered 2012-08-12: 2 g via INTRAVENOUS
  Filled 2012-08-12: qty 50

## 2012-08-12 MED ORDER — POTASSIUM CHLORIDE ER 10 MEQ PO TBCR: 10.0000 meq | EXTENDED_RELEASE_TABLET | Freq: Every day | ORAL | Status: DC

## 2012-08-12 MED ORDER — ASPIRIN 81 MG PO TBEC: 81.0000 mg | DELAYED_RELEASE_TABLET | Freq: Every day | ORAL | Status: DC

## 2012-08-12 MED ORDER — POTASSIUM CHLORIDE CRYS ER 10 MEQ PO TBCR
10.0000 meq | EXTENDED_RELEASE_TABLET | Freq: Every day | ORAL | Status: DC
  Administered 2012-08-12: 10 meq via ORAL
  Filled 2012-08-12: qty 1

## 2012-08-12 NOTE — Discharge Summary (Signed)
Discharge Summary   Patient ID: JOSSLYNN MENTZER MRN: [id number], DOB/AGE: [birth year], [age] y.o. Admit date: 08/10/2012 D/C date:     08/12/2012  Primary Cardiologist: Shirlee Latch  Primary Discharge Diagnoses:  1. Acute on chronic combined CHF/NICM EF 25% (hypertensive heart disease) - echo this admission confirming persistently low EF 25%, diffuse HK, mild MR, restrictive mitral inflows, PA pressure 2. HTN, uncontrolled 3. Type 2 DM, uncontrolled A1C 9.9 4. Tobacco abuse 5. Medical noncompliance 6. Anemia 7. LBBB  Secondary Discharge Diagnoses:  1. Nonobstructive CAD 02/2012  Hospital Course:  Ms. Kutsch is a 62 y/o F with history of nonobstructive CAD 03/18/12, NICM, chronic combined CHF (EF 20-25%, grade 2 diastolic dysfunction), hypertensive heart disease, type 2 DM, HTN and tobacco abuse who presented to [facility] ED 08/10/2012 with shortness of breath.   She was last seen in February 2014 during an admission for new onset CHF. CT-A had revealed no PE but confirmed CHF. She was started on ACEi, BB, spironolactone and Lasix. Echo at that time showed EF 20-25%, moderate LVH, grade 2 diastolic CHF, inferior and basal to mid septal akinesis, anterior moderate to severe hypokinesis. Cardiac cath was performed revealing nonobstructive CAD, elevated LVEDP and tortuous vessels suggestive of long-standing hypertension. She was set up for a follow-up appointment and labwork in 1 week which she did not attend. Note, consideration of repeat echo 3 months from then was advised to re-evaluate EF and consider ICD/CRT-D implantation.   She presented back to the ED on 08/10/12 with complaints of DOE progressing to rest dyspnea x 1 week with associated abdominal distention, LE edema, PND and orthopnea. She reported chest pressure w/o radiation. She said she had run out of her Lasix for 3 days. She denied palpitations, syncope, fevers, chills, bleeding, EtOH, tobacco or illicit drug use. She was markedly  hypertensive on arrival at 203/169. EKG showed sinus tach with LBBB. CMET was unremarkable, Hgb 11.5, CXR with mild interstitial prominence bilaterally suspicious for mild interstitial edema, probable bilateral small pleural effusions. She was placed on IV NTG gtt and started on IV Lasix for probable combined CHF. Repeat echocardiogram showed mild LVH, EF 25%, diffuse HK, mild MR with restrictive mitral inflows, and PA pressure . TSH/free T4 were normal. Medication compliance has been strictly reinforced. When placed on her home medication regimen in the hospital in combination with IV Lasix, her blood pressure significantly improved. With diuresis, she went from 225->217lb and is -3L today. She was instructed to contact PCP day of discharge to discuss plan for improved diabetes management given poor control with A1C 9.9, as well as monitoring/eval of her mild anemia demonstrated this admission. Today she is feeling better. Per discussion with Dr. Antoine Poche, the patient will need to demonstrate medication/follow-up compliance before consideration of ICD. Dr. Antoine Poche has seen and examined the patient today and feels she is stable for discharge. He recommended Lasix 40mg  daily with potassium daily per our verbal discussion. Consider outpatient BMET/Mg given hypomg/hypok on admission. She also received magnesium sulfate prior to discharge (Mg 1.6).   Given that the office was not yet open at time of discharge, I have left a message on our office's scheduling voicemail requesting a follow-up appointment, and our office will call the patient with this appointment.   Discharge Vitals: Blood pressure 139/90, pulse 86, temperature 97.6 F (36.4 C), temperature source Oral, resp. rate 18, height 5\' 7"  (1.702 m), weight 217 lb 6 oz (98.6 kg), SpO2 100.00%.  Labs: Lab  Results  Component Value Date   WBC 9.5 08/10/2012   HGB 11.1* 08/10/2012   HCT 31.6* 08/10/2012   MCV 88.5 08/10/2012   PLT 391 08/10/2012      Recent Labs Lab 08/10/12 0919  08/12/12 0423  NA 138  < > 137  K 3.6  < > 3.9  CL 101  < > 97  CO2 26  < > 32  BUN 11  < > 11  CREATININE 0.71  < > 0.66  CALCIUM 9.4  < > 9.3  PROT 7.3  --   --   BILITOT 0.6  --   --   ALKPHOS 81  --   --   ALT 21  --   --   AST 26  --   --   GLUCOSE 275*  < > 226*  < > = values in this interval not displayed.  Recent Labs  08/10/12 1542 08/10/12 2010  TROPONINI <0.30 <0.30   Lab Results  Component Value Date   CHOL 123 08/11/2012   HDL 40 08/11/2012   LDLCALC 66 08/11/2012   TRIG 84 08/11/2012     Diagnostic Studies/Procedures    08/10/12 Echo - Left ventricle: The cavity size was moderately dilated. Wall thickness was increased in a pattern of mild LVH. The estimated ejection fraction was 25%. Diffuse hypokinesis. - Mitral valve: Mild regurgitation. - Left atrium: The atrium was mildly dilated. - Atrial septum: No defect or patent foramen ovale was identified. - Pulmonary arteries: PA peak pressure: 44mm Hg (S). - Impressions: Restrictive mitral inflows Impressions: - Restrictive mitral inflows   Dg Chest Port 1 View  08/10/2012   *RADIOLOGY REPORT*  Clinical Data: Shortness of breath  PORTABLE CHEST - 1 VIEW  Comparison: 03/04/2012  Findings: Cardiomegaly again noted.  There is mild interstitial prominence bilaterally suspicious for mild interstitial edema. Probable bilateral small pleural effusion with bilateral basilar atelectasis.  IMPRESSION: There is mild interstitial prominence bilaterally suspicious for mild interstitial edema.  Probable bilateral small pleural effusion with bilateral basilar atelectasis.   Original Report Authenticated By: Natasha Mead, M.D.    Discharge Medications     Medication List         aspirin 81 MG EC tablet  Take 1 tablet (81 mg total) by mouth daily.     atorvastatin 20 MG tablet  Commonly known as:  LIPITOR  Take 1 tablet (20 mg total) by mouth daily.     carvedilol 6.25 MG tablet   Commonly known as:  COREG  Take 1 tablet (6.25 mg total) by mouth 2 (two) times daily with a meal.     enalapril 5 MG tablet  Commonly known as:  VASOTEC  Take 1 tablet (5 mg total) by mouth 2 (two) times daily.     furosemide 40 MG tablet  Commonly known as:  LASIX  Take 1 tablet (40 mg total) by mouth daily.     glimepiride 2 MG tablet  Commonly known as:  AMARYL  Take 2 mg by mouth daily before breakfast.     hydrALAZINE 25 MG tablet  Commonly known as:  APRESOLINE  Take 1 tablet (25 mg total) by mouth 2 (two) times daily.     HYDROcodone-acetaminophen 5-325 MG per tablet  Commonly known as:  NORCO/VICODIN  Take 1-2 tablets by mouth every 6 (six) hours as needed for pain.     insulin glargine 100 UNIT/ML injection  Commonly known as:  LANTUS  Inject 10-40 Units into  the skin 2 (two) times daily. Pt takes 10 units in the morning and 40 at night     isosorbide mononitrate 30 MG 24 hr tablet  Commonly known as:  IMDUR  Take 1 tablet (30 mg total) by mouth daily.     metFORMIN 1000 MG tablet  Commonly known as:  GLUCOPHAGE  Take 1 tablet (1,000 mg total) by mouth every evening.     oxyCODONE-acetaminophen 5-325 MG per tablet  Commonly known as:  PERCOCET/ROXICET  Take 1 tablet by mouth every 4 (four) hours as needed for pain.     potassium chloride 10 MEQ tablet  Commonly known as:  K-DUR  Take 1 tablet (10 mEq total) by mouth daily.     spironolactone 12.5 mg Tabs  Commonly known as:  ALDACTONE  Take 0.5 tablets (12.5 mg total) by mouth daily.        Disposition   The patient will be discharged in stable condition to home. Discharge Orders   Future Appointments Provider Department Dept Phone   08/25/2012 1:40 PM [facility] [phone]   Patient should wear two piece clothing and wear no powder or deodorant. Patient should arrive 15 minutes early.   Future Orders Complete By Expires     Diet - low sodium heart healthy   As directed     Discharge instructions  As directed     Comments:      For patients with congestive heart failure, we give them these special instructions:  1. Follow a low-salt diet and watch your fluid intake. In general, you should not be taking in more than 2 liters of fluid per day (no more than 8 glasses per day). Some patients are restricted to less than 1.5 liters of fluid per day (no more than 6 glasses per day). This includes sources of water in foods like soup, coffee, tea, milk, etc. 2. Weigh yourself on the same scale at same time of day and keep a log. 3. Call your doctor: (Anytime you feel any of the following symptoms)  - 3-4 pound weight gain in 1-2 days or 2 pounds overnight  - Shortness of breath, with or without a dry hacking cough  - Swelling in the hands, feet or stomach  - If you have to sleep on extra pillows at night in order to breathe   IT IS IMPORTANT TO LET YOUR DOCTOR KNOW EARLY ON IF YOU ARE HAVING SYMPTOMS SO WE CAN HELP YOU!    Increase activity slowly  As directed       Follow-up Information   Follow up with Marca Ancona, MD. (Our office will call you for a follow-up appointment. Please call the office if you have not heard from [place] within 3 days.)    Contact information:   [address] [place] [address] [phone]       Follow up with Primary Care Provider. (Call your primary doctor today to discuss a plan for better control of your diabetes. Your A1C was 9.9, which is way too high. You also need further monitoring of your anemia since your blood count was mildly low.)         Duration of Discharge Encounter: Greater than 30 minutes including physician and PA time.  Signed, Ronie Spies PA-C 08/12/2012, 8:17 AM   Patient seen and examined.  Plan as discussed in my rounding note for today and outlined above. Rollene Rotunda  08/12/2012  1:42 PM

## 2012-08-12 NOTE — Progress Notes (Signed)
   SUBJECTIVE:  She is breathing better and at baseline.  No pain.    PHYSICAL EXAM Filed Vitals:   08/11/12 0908 08/11/12 1334 08/11/12 2126 08/12/12 0550  BP: 126/74 137/72 124/62 139/90  Pulse: 80 93 81 86  Temp:  98.1 F (36.7 C) 97.8 F (36.6 C) 97.6 F (36.4 C)  TempSrc:  Oral Oral Oral  Resp:  18 20 18   Height:      Weight:    217 lb 6 oz (98.6 kg)  SpO2:  97% 95% 100%   General:  No distress Neck:  No JVD Lungs:  Clear Heart:  RRR Abdomen:  Positive bowel sounds, no rebound no guarding Extremities:  No edema.   LABS:  Results for orders placed during the hospital encounter of 08/10/12 (from the past 24 hour(s))  GLUCOSE, CAPILLARY     Status: Abnormal   Collection Time    08/11/12  7:35 AM      Result Value Range   Glucose-Capillary 228 (*) 70 - 99 mg/dL  GLUCOSE, CAPILLARY     Status: Abnormal   Collection Time    08/11/12 11:42 AM      Result Value Range   Glucose-Capillary 213 (*) 70 - 99 mg/dL  GLUCOSE, CAPILLARY     Status: Abnormal   Collection Time    08/11/12  5:00 PM      Result Value Range   Glucose-Capillary 195 (*) 70 - 99 mg/dL  GLUCOSE, CAPILLARY     Status: Abnormal   Collection Time    08/11/12  9:23 PM      Result Value Range   Glucose-Capillary 227 (*) 70 - 99 mg/dL   Comment 1 Notify RN    BASIC METABOLIC PANEL     Status: Abnormal   Collection Time    08/12/12  4:23 AM      Result Value Range   Sodium 137  135 - 145 mEq/L   Potassium 3.9  3.5 - 5.1 mEq/L   Chloride 97  96 - 112 mEq/L   CO2 32  19 - 32 mEq/L   Glucose, Bld 226 (*) 70 - 99 mg/dL   BUN 11  6 - 23 mg/dL   Creatinine, Ser 1.61  0.50 - 1.10 mg/dL   Calcium 9.3  8.4 - 09.6 mg/dL   GFR calc non Af Amer >90  >90 mL/min   GFR calc Af Amer >90  >90 mL/min    Intake/Output Summary (Last 24 hours) at 08/12/12 0643 Last data filed at 08/12/12 0553  Gross per 24 hour  Intake    840 ml  Output   1500 ml  Net   -660 ml      ASSESSMENT AND PLAN:  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  Send home with 10 meq of KDUR and 20 mg Lasix daily until she is seen in follow up.    HTN:  BP is now easily controlled on her current meds.    Type 2 DM :  On discharge she will continue her previous medications.    Tobacco abuse:  Educated.    Medical noncompliance:  She verbalizes the understanding for compliance.   She needs a transition of care follow up with PA/NP or Dr. Alison Murray [facility] 08/12/2012 6:43 AM

## 2012-08-13 LAB — GLUCOSE, CAPILLARY: Glucose-Capillary: 235 mg/dL — ABNORMAL HIGH (ref 70–99)

## 2012-08-17 ENCOUNTER — Encounter: Payer: Self-pay | Admitting: Cardiology

## 2012-08-17 ENCOUNTER — Other Ambulatory Visit (HOSPITAL_COMMUNITY): Payer: Self-pay | Admitting: Physician Assistant

## 2012-08-17 ENCOUNTER — Ambulatory Visit (INDEPENDENT_AMBULATORY_CARE_PROVIDER_SITE_OTHER): Payer: PRIVATE HEALTH INSURANCE | Admitting: Cardiology

## 2012-08-17 VITALS — BP 128/80 | HR 92 | Ht 67.0 in | Wt 213.2 lb

## 2012-08-17 DIAGNOSIS — Z9119 Patient's noncompliance with other medical treatment and regimen: Secondary | ICD-10-CM

## 2012-08-17 DIAGNOSIS — Z91199 Patient's noncompliance with other medical treatment and regimen due to unspecified reason: Secondary | ICD-10-CM

## 2012-08-17 DIAGNOSIS — I428 Other cardiomyopathies: Secondary | ICD-10-CM

## 2012-08-17 DIAGNOSIS — I10 Essential (primary) hypertension: Secondary | ICD-10-CM

## 2012-08-17 DIAGNOSIS — I5042 Chronic combined systolic (congestive) and diastolic (congestive) heart failure: Secondary | ICD-10-CM

## 2012-08-17 DIAGNOSIS — E876 Hypokalemia: Secondary | ICD-10-CM

## 2012-08-17 DIAGNOSIS — I509 Heart failure, unspecified: Secondary | ICD-10-CM

## 2012-08-17 NOTE — Patient Instructions (Signed)
NO MEDICATION CHANGES WERE MADE TODAY  LAB TODAY, BMET  YOU HAVE A FOLLOW UP APPT WITH DR.MCLEAN 09/28/12 @8 :30AM

## 2012-08-18 ENCOUNTER — Other Ambulatory Visit: Payer: Self-pay | Admitting: *Deleted

## 2012-08-18 DIAGNOSIS — I5042 Chronic combined systolic (congestive) and diastolic (congestive) heart failure: Secondary | ICD-10-CM

## 2012-08-18 LAB — BASIC METABOLIC PANEL
BUN: 14 mg/dL (ref 6–23)
CO2: 30 mEq/L (ref 19–32)
Calcium: 9.9 mg/dL (ref 8.4–10.5)
Chloride: 96 mEq/L (ref 96–112)
Creatinine, Ser: 0.8 mg/dL (ref 0.4–1.2)
GFR: 99.93 mL/min (ref >60.00)
Glucose, Bld: 160 mg/dL — ABNORMAL HIGH (ref 70–99)
Potassium: 5.1 mEq/L (ref 3.5–5.1)
Sodium: 136 mEq/L (ref 135–145)

## 2012-08-18 NOTE — Progress Notes (Addendum)
ELECTROPHYSIOLOGY OFFICE NOTE  Patient ID: [name] MRN: [id number], DOB/AGE: [birth year]   Date of Visit: 08/17/2012  Primary Physician: Provider Not In System Primary Cardiologist: Shirlee Latch, MD  Reason for Visit: Hospital follow-up  History of Present Illness  [name] is a 62 y.o. 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 She had not been taking her diuretic. She was admitted and diuresed. She presents today for hospital follow-up.      Since discharge, she reports she is doing well and has no complaints. She denies chest pain or shortness of breath. She denies palpitations, dizziness, near syncope or syncope. She denies LE swelling, orthopnea, PND or recent weight gain. She is compliant and tolerating medications without difficulty. She states, "I'm feeling much better since I'm taking my water pill."  Past Medical History Past Medical History  Diagnosis Date  . Diabetes mellitus   . Hypertension   . CAD (coronary artery disease), native coronary artery     a. Nonobstructive by cath 02/2012 (done because of low EF).  . Tobacco abuse   . Chronic combined systolic and diastolic CHF (congestive heart failure)     a. 03/05/12 echo:  LVEF 20-25%, moderate LVH , inferior and basal to mid septal akinesis, anterior moderate to severe hypokinesis and grade 2 diastolic dysfunction. b. EF 07/2012: EF still 25% (unclear medication compliance).  . Anemia     a. Noted on 07/2012 labs, instructed to f/u PCP.  Marland Kitchen LBBB (left bundle branch block)   . History of noncompliance with medical treatment     Past Surgical History Past Surgical History  Procedure Laterality Date  . Joint replacement      Bilateral hip and right knee  . Cardiac  catheterization  03/04/12    nonobstructive CAD, elevated LVEDP and tortuous vessels suggestive of long-standing hypertension    Allergies/Intolerances Allergies  Allergen Reactions  . Morphine And Related Nausea And Vomiting    Severe nausea   Current Home Medications Current Outpatient Prescriptions  Medication Sig Dispense Refill  . aspirin EC 81 MG EC tablet Take 1 tablet (81 mg total) by mouth daily.      Marland Kitchen atorvastatin (LIPITOR) 20 MG tablet Take 1 tablet (20 mg total) by mouth daily.  30 tablet  3  . carvedilol (COREG) 6.25 MG tablet Take 1 tablet (6.25 mg total) by mouth 2 (two) times daily with a meal.  60 tablet  3  . enalapril (VASOTEC) 5 MG tablet Take 1 tablet (5 mg total) by mouth 2 (two) times daily.  60 tablet  3  . furosemide (LASIX) 40 MG tablet Take 1 tablet (40 mg total) by mouth daily.  30 tablet  3  . glimepiride (AMARYL) 2 MG tablet Take 2 mg by mouth daily before breakfast.      . hydrALAZINE (APRESOLINE) 25 MG tablet Take 1 tablet (25 mg total) by mouth 2 (two) times daily.  60 tablet  3  . HYDROcodone-acetaminophen (NORCO/VICODIN) 5-325 MG per tablet Take 1-2 tablets by mouth every 6 (six) hours as needed for pain.  17 tablet  0  . insulin glargine (LANTUS) 100 UNIT/ML injection Inject 10-40 Units into the skin 2 (two) times daily. Pt takes 10 units in the morning  and 40 at night      . isosorbide mononitrate (IMDUR) 30 MG 24 hr tablet Take 1 tablet (30 mg total) by mouth daily.  30 tablet  3  . metFORMIN (GLUCOPHAGE) 1000 MG tablet Take 1 tablet (1,000 mg total) by mouth every evening.      Marland Kitchen oxyCODONE-acetaminophen (PERCOCET/ROXICET) 5-325 MG per tablet Take 1 tablet by mouth every 4 (four) hours as needed for pain.  20 tablet  0  . potassium chloride (K-DUR) 10 MEQ tablet Take 1 tablet (10 mEq total) by mouth daily.  30 tablet  2  . spironolactone (ALDACTONE) 12.5 mg TABS Take 0.5 tablets (12.5 mg total) by mouth daily.  30 tablet  2   No current  facility-administered medications for this visit.   Social History History   Social History  . Marital Status: Single    Spouse Name: N/A    Number of Children: N/A  . Years of Education: N/A   Occupational History  . Not on file.   Social History Main Topics  . Smoking status: Former Smoker -- 1.00 packs/day for 40 years    Types: Cigarettes    Start date: 06/23/1972    Quit date: 03/26/2012  . Smokeless tobacco: Never Used  . Alcohol Use: No  . Drug Use: No  . Sexually Active: Not on file   Other Topics Concern  . Not on file   Social History Narrative  . No narrative on file     Review of Systems General: No chills, fever, night sweats or weight changes Cardiovascular: No chest pain, dyspnea on exertion, edema, orthopnea, palpitations, paroxysmal nocturnal dyspnea Dermatological: No rash, lesions or masses Respiratory: No cough, dyspnea Urologic: No hematuria, dysuria Abdominal: No nausea, vomiting, diarrhea, bright red blood per rectum, melena, or hematemesis Neurologic: No visual changes, weakness, changes in mental status All other systems reviewed and are otherwise negative except as noted above.  Physical Exam Vitals: Blood pressure 128/80, pulse 92, height 5\' 7"  (1.702 m), weight 213 lb 3.2 oz (96.707 kg), SpO2 98.00%.  General: Well developed, well appearing 62 y.o. female in no acute distress. HEENT: Normocephalic, atraumatic. EOMs intact. Sclera nonicteric. Oropharynx clear.  Neck: Supple without bruits. No JVD. Lungs: Respirations regular and unlabored, CTA bilaterally. No wheezes, rales or rhonchi. Heart: RRR. S1, S2 present. No murmurs, rub, S3 or S4. Abdomen: Soft, non-tender, non-distended. BS present x 4 quadrants. No hepatosplenomegaly.  Extremities: No clubbing, cyanosis or edema. DP/PT/Radials 2+ and equal bilaterally. Psych: Normal affect. Neuro: Alert and oriented X 3. Moves all extremities spontaneously.    Assessment and Plan 1. Chronic  combined systolic and diastolic HF  - acute decompensation resolved; stable without worsening HF symptoms  - counseled regarding the importance of medication compliance, low sodium diet and daily weights  - continue medical therapy with close follow-up  - will check BMET  - follow-up with Dr. Shirlee Latch in 4 weeks 2. NICM, EF 25%  - as outlined above  - continue medical therapy  - per Dr. Jenene Slicker note at discharge, if [name] demonstrates medication and follow-up compliance, consider referral to EP for possible ICD; will see Dr. Shirlee Latch, her primary cardiologist, in 4 weeks to discuss further   3. HTN  - normotensive today  - continue current regimen 4. Medical noncompliance  - counseled regarding importance of medication compliance and follow-up  Signed, Yelitza Reach, PA-C 08/18/2012, 9:06 AM

## 2012-08-20 ENCOUNTER — Other Ambulatory Visit (HOSPITAL_COMMUNITY): Payer: Self-pay | Admitting: Physician Assistant

## 2012-08-25 ENCOUNTER — Ambulatory Visit
Admission: RE | Admit: 2012-08-25 | Discharge: 2012-08-25 | Disposition: A | Discharge status: Home / Self Care | Payer: PRIVATE HEALTH INSURANCE | Source: Ambulatory Visit | Attending: Internal Medicine | Admitting: Internal Medicine

## 2012-08-25 ENCOUNTER — Other Ambulatory Visit (INDEPENDENT_AMBULATORY_CARE_PROVIDER_SITE_OTHER): Payer: PRIVATE HEALTH INSURANCE

## 2012-08-25 DIAGNOSIS — Z1231 Encounter for screening mammogram for malignant neoplasm of breast: Secondary | ICD-10-CM

## 2012-08-25 DIAGNOSIS — I5042 Chronic combined systolic (congestive) and diastolic (congestive) heart failure: Secondary | ICD-10-CM

## 2012-08-25 LAB — BASIC METABOLIC PANEL
BUN: 15 mg/dL (ref 6–23)
CO2: 27 mEq/L (ref 19–32)
Calcium: 9.3 mg/dL (ref 8.4–10.5)
Chloride: 102 mEq/L (ref 96–112)
Creatinine, Ser: 1 mg/dL (ref 0.4–1.2)
GFR: 75.4 mL/min (ref >60.00)
Glucose, Bld: 259 mg/dL — ABNORMAL HIGH (ref 70–99)
Potassium: 4 mEq/L (ref 3.5–5.1)
Sodium: 138 mEq/L (ref 135–145)

## 2012-09-29 ENCOUNTER — Encounter: Payer: Self-pay | Admitting: Cardiology

## 2012-09-29 ENCOUNTER — Ambulatory Visit (INDEPENDENT_AMBULATORY_CARE_PROVIDER_SITE_OTHER): Payer: PRIVATE HEALTH INSURANCE | Admitting: Cardiology

## 2012-09-29 VITALS — BP 132/82 | HR 95 | Ht 67.0 in | Wt 217.0 lb

## 2012-09-29 DIAGNOSIS — I509 Heart failure, unspecified: Secondary | ICD-10-CM

## 2012-09-29 DIAGNOSIS — I10 Essential (primary) hypertension: Secondary | ICD-10-CM

## 2012-09-29 DIAGNOSIS — I428 Other cardiomyopathies: Secondary | ICD-10-CM

## 2012-09-29 DIAGNOSIS — I5022 Chronic systolic (congestive) heart failure: Secondary | ICD-10-CM | POA: Insufficient documentation

## 2012-09-29 DIAGNOSIS — I5042 Chronic combined systolic (congestive) and diastolic (congestive) heart failure: Secondary | ICD-10-CM

## 2012-09-29 MED ORDER — HYDRALAZINE HCL 25 MG PO TABS: 25.0000 mg | ORAL_TABLET | Freq: Three times a day (TID) | ORAL | Status: DC

## 2012-09-29 MED ORDER — ENALAPRIL MALEATE 10 MG PO TABS: 10.0000 mg | ORAL_TABLET | Freq: Two times a day (BID) | ORAL | Status: DC

## 2012-09-29 MED ORDER — FUROSEMIDE 40 MG PO TABS: ORAL_TABLET | ORAL | Status: DC

## 2012-09-29 NOTE — Progress Notes (Signed)
Patient ID: [name], female   DOB: October 02, 1950, 62 y.o.   MRN: [id number] PCP: Dr. Mikeal Wilson  62 yo with history of nonischemic cardiomyopathy presents for cardiology followup.  She was admitted with CHF exacerbation in 60/14.  EF 20-25% on echo, LHC with nonobstructive CAD.  She was started on cardiac meds and discharged.  In 7/14, she was admitted again with CHF exacerbation.  She had run out of Lasix.  She was taking her other heart medications as ordered, however.  She was diuresed and discharged.  Repeat echo in 7/14 showed that EF remained 25%.  She has a chronic LBBB.    Currently, patient is short of breath after walking about 1 block.  She is very short of breath walking up steps.  No orthopnea or PND.  No chest pain.  She is taking all her medications as ordered.  She comes with her daughter today.    ECG: NSR, LBBB with QRS 166 msec  Labs (2/14): SPEP negative, UPEP negative, HIV negative Labs (7/14): K 4, creatinine 1.0, LDL 66  PMH: 1. HTN 2. Type II diabetes 3. Nonischemic cardiomyopathy: ? Due to HTN versus LBBB CMP.  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  Echo (2/14) with EF 20-25%.  Echo (7/14) with EF 25%, diffuse hypokinesis.  HIV, SPEP, UPEP negative.  Has LBBB. 4. Chronic LBBB 5. Right TKR 6. Bilateral THR.  7. Hyperlipidemia  SH: Prior smoker, quit 2/14.  Never drank ETOH.  No drugs. Lives with son.   FH: Mother with "heart trouble."   ROS: All systems reviewed and negative except as per HPI.   Current Outpatient Prescriptions  Medication Sig Dispense Refill  . aspirin EC 81 MG EC tablet Take 1 tablet (81 mg total) by mouth daily.      Marland Kitchen atorvastatin (LIPITOR) 20 MG tablet Take 1 tablet (20 mg total) by mouth daily.  30 tablet  3  . carvedilol (COREG) 6.25 MG tablet Take 1 tablet (6.25 mg total) by mouth 2 (two) times daily with a meal.  60 tablet  3  . glimepiride (AMARYL) 2 MG tablet Take 2 mg by mouth daily before breakfast.      . HYDROcodone-acetaminophen  (NORCO/VICODIN) 5-325 MG per tablet Take 1-2 tablets by mouth every 6 (six) hours as needed for pain.  17 tablet  0  . insulin glargine (LANTUS) 100 UNIT/ML injection Inject 10-40 Units into the skin 2 (two) times daily. Pt takes 10 units in the morning and 40 at night      . isosorbide mononitrate (IMDUR) 30 MG 24 hr tablet Take 1 tablet (30 mg total) by mouth daily.  30 tablet  3  . metFORMIN (GLUCOPHAGE) 1000 MG tablet Take 1 tablet (1,000 mg total) by mouth every evening.      Marland Kitchen oxyCODONE-acetaminophen (PERCOCET/ROXICET) 5-325 MG per tablet Take 1 tablet by mouth every 4 (four) hours as needed for pain.  20 tablet  0  . spironolactone (ALDACTONE) 12.5 mg TABS Take 0.5 tablets (12.5 mg total) by mouth daily.  30 tablet  2  . enalapril (VASOTEC) 10 MG tablet Take 1 tablet (10 mg total) by mouth 2 (two) times daily.  60 tablet  3  . furosemide (LASIX) 40 MG tablet 1 tablet (40 mg)  two times a day for 3 days, then 1 tablet (40mg ) in the AM and 1/2 tablet (total 20mg ) in the PM  60 tablet  3  . hydrALAZINE (APRESOLINE) 25 MG tablet Take 1 tablet (  25 mg total) by mouth 3 (three) times daily.  90 tablet  3   No current facility-administered medications for this visit.    BP 132/82  Pulse 95  Ht 5\' 7"  (1.702 m)  Wt 98.431 kg (217 lb)  BMI 33.98 kg/m2  SpO2 98% General: NAD Neck: JVP 10 cm, no thyromegaly or thyroid nodule.  Lungs: Clear to auscultation bilaterally with normal respiratory effort. CV: Nondisplaced PMI.  Heart regular S1/S2, no S3/S4, no murmur.  Trace ankle edema.  No carotid bruit.  Normal pedal pulses.  Abdomen: Soft, nontender, no hepatosplenomegaly, no distention.  Skin: Intact without lesions or rashes.  Neurologic: Alert and oriented x 3.  Psych: Normal affect. Extremities: No clubbing or cyanosis.   Assessment/Plan: 1. Chronic systolic CHF: Nonischemic cardiomyopathy.  Patient has NYHA class III symptoms.  She is volume overloaded on exam.  Cause of cardiomyopathy is  uncertain.  Possible viral myocarditis, possible HTN-related, possible LBBB cardiomyopathy.  No definite family history.  No drugs or ETOH.  HIV, SPEP/UPEP negative.  - Continue Coreg at current dose given current volume overload.  - Increase hydralazine to 25 mg tid from bid and continue current Imdur.  - Increase enalapril to 10 mg bid.  - Continue current spironolactone.  - Increase Lasix to 40 mg bid x 3 days, then 40 qam/20 qpm.  Needs BMET/BNP in 1 week.  - LBBB with QRS > 160 msec.  She has been compliant with meds with echo showing persistent decreased EF.  Will refer to EP for BiV-ICD consideration.  - Followup in CHF clinic in 2 wks and here in 1 month.  2. Hyperlipidemia: Good lipids on current statin.  3. HTN: BP controlled.   [name] 09/29/2012 10:34 AM

## 2012-09-29 NOTE — Patient Instructions (Addendum)
Increase lasix (furosemide) to 40mg  (1 tablet)two times a day for 3 days, then take 40mg  (1 tablet) in the AM and 20mg  (1/2 tablet) in the PM around 4 PM.  Increase enalapril (vasotec) to 10mg  two times a day. You can take 2 of your 5mg  tablets two times a day and use your current supply.  Increase hydralazine to 25mg  three times a day.   Your physician recommends that you return for lab work in: 1 week--BMET/BNP.   You have been referred to EP for evaluation for Bi-V ICD.  Your physician recommends that you schedule a follow-up appointment in: 2 weeks in the Heart Failure Clinic at [facility] on a day Dr Shirlee Latch is there.   Your physician recommends that you schedule a follow-up appointment in: 1 month with Dr Shirlee Latch in the [facility].

## 2012-10-06 ENCOUNTER — Other Ambulatory Visit (INDEPENDENT_AMBULATORY_CARE_PROVIDER_SITE_OTHER): Payer: PRIVATE HEALTH INSURANCE

## 2012-10-06 DIAGNOSIS — I5042 Chronic combined systolic (congestive) and diastolic (congestive) heart failure: Secondary | ICD-10-CM

## 2012-10-06 DIAGNOSIS — I428 Other cardiomyopathies: Secondary | ICD-10-CM

## 2012-10-06 DIAGNOSIS — I10 Essential (primary) hypertension: Secondary | ICD-10-CM

## 2012-10-06 DIAGNOSIS — R0602 Shortness of breath: Secondary | ICD-10-CM

## 2012-10-06 LAB — BASIC METABOLIC PANEL
BUN: 15 mg/dL (ref 6–23)
CO2: 29 mEq/L (ref 19–32)
Calcium: 9 mg/dL (ref 8.4–10.5)
Chloride: 101 mEq/L (ref 96–112)
Creatinine, Ser: 0.7 mg/dL (ref 0.4–1.2)
GFR: 108.04 mL/min (ref >60.00)
Glucose, Bld: 111 mg/dL — ABNORMAL HIGH (ref 70–99)
Potassium: 3.3 mEq/L — ABNORMAL LOW (ref 3.5–5.1)
Sodium: 138 mEq/L (ref 135–145)

## 2012-10-06 LAB — BRAIN NATRIURETIC PEPTIDE: Pro B Natriuretic peptide (BNP): 188 pg/mL — ABNORMAL HIGH (ref 0.0–100.0)

## 2012-10-08 ENCOUNTER — Telehealth: Payer: Self-pay | Admitting: Nurse Practitioner

## 2012-10-08 DIAGNOSIS — E876 Hypokalemia: Secondary | ICD-10-CM

## 2012-10-08 MED ORDER — SPIRONOLACTONE 12.5 MG HALF TABLET: 25.0000 mg | ORAL_TABLET | Freq: Every day | ORAL | Status: DC

## 2012-10-08 NOTE — Telephone Encounter (Signed)
Spoke with patient to give her instructions to increase Spironolactone to 25 mg daily for low potassium per Dr. Shirlee Latch and repeat BMET in 10 days.  Patient verbalized understanding and lab appointment made for 9/24

## 2012-10-14 ENCOUNTER — Ambulatory Visit (HOSPITAL_COMMUNITY)
Admission: RE | Admit: 2012-10-14 | Discharge: 2012-10-14 | Disposition: A | Discharge status: Home / Self Care | Payer: PRIVATE HEALTH INSURANCE | Source: Ambulatory Visit | Attending: Cardiology | Admitting: Cardiology

## 2012-10-14 VITALS — BP 118/72 | HR 110 | Wt 215.0 lb

## 2012-10-14 DIAGNOSIS — I10 Essential (primary) hypertension: Secondary | ICD-10-CM | POA: Insufficient documentation

## 2012-10-14 DIAGNOSIS — I509 Heart failure, unspecified: Secondary | ICD-10-CM

## 2012-10-14 DIAGNOSIS — I428 Other cardiomyopathies: Secondary | ICD-10-CM | POA: Insufficient documentation

## 2012-10-14 DIAGNOSIS — I5022 Chronic systolic (congestive) heart failure: Secondary | ICD-10-CM

## 2012-10-14 DIAGNOSIS — I5042 Chronic combined systolic (congestive) and diastolic (congestive) heart failure: Secondary | ICD-10-CM | POA: Insufficient documentation

## 2012-10-14 LAB — BASIC METABOLIC PANEL
BUN: 14 mg/dL (ref 6–23)
CO2: 28 mEq/L (ref 19–32)
Calcium: 9.5 mg/dL (ref 8.4–10.5)
Chloride: 95 mEq/L — ABNORMAL LOW (ref 96–112)
Creatinine, Ser: 0.71 mg/dL (ref 0.50–1.10)
GFR calc Af Amer: >90 mL/min (ref >90)
GFR calc non Af Amer: >90 mL/min (ref >90)
Glucose, Bld: 136 mg/dL — ABNORMAL HIGH (ref 70–99)
Potassium: 3.3 mEq/L — ABNORMAL LOW (ref 3.5–5.1)
Sodium: 137 mEq/L (ref 135–145)

## 2012-10-14 LAB — PRO B NATRIURETIC PEPTIDE: Pro B Natriuretic peptide (BNP): 552.5 pg/mL — ABNORMAL HIGH (ref 0–125)

## 2012-10-14 MED ORDER — POTASSIUM CHLORIDE ER 10 MEQ PO TBCR: 20.0000 meq | EXTENDED_RELEASE_TABLET | Freq: Every day | ORAL | Status: DC

## 2012-10-14 MED ORDER — HYDRALAZINE HCL 25 MG PO TABS: 37.5000 mg | ORAL_TABLET | Freq: Three times a day (TID) | ORAL | Status: DC

## 2012-10-14 NOTE — Progress Notes (Signed)
Patient ID: [name], female   DOB: 1950-07-05, 62 y.o.   MRN: [id number] PCP: Dr. Mikeal Hawthorne  62 yo with history of nonischemic cardiomyopathy presents for cardiology followup.  She was admitted with CHF exacerbation in 2/14.  EF 20-25% on echo, LHC with nonobstructive CAD.  She was started on cardiac meds and discharged.  In 7/14, she was admitted again with CHF exacerbation.  She had run out of Lasix.  She was taking her other heart medications as ordered, however.  She was diuresed and discharged.  Repeat echo in 7/14 showed that EF remained 25%.  She has a chronic LBBB.    She returns for follow up.Last visit hydralazine increased to 25 mg tid from bid and enalapril was increased to 10 mg bid.  Lasix was also increased.  Complains of fatigue. Remains SOB with exertion but exercise tolerance has improved. She is able to walk about 100 yards before becoming short of breath. No orthopnea or PND.  She is taking all her medications as ordered.  No lower extremity edema. Not weighing at home. She does not have a scale.   ECG: NSR, LBBB with QRS 166 msec  Labs (2/14): SPEP negative, UPEP negative, HIV negative Labs (7/14): K 4, creatinine 1.0, LDL 66 Labs (08/25/12): K 4, creatinine 1.0 Labs (10/06/12) : K 3.3 creatinine 0.7 pro-bnp 188  PMH: 1. HTN 2. Type II diabetes 3. Nonischemic cardiomyopathy: ? Due to HTN versus LBBB CMP.  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  Echo (2/14) with EF 20-25%.  Echo (7/14) with EF 25%, diffuse hypokinesis.  HIV, SPEP, UPEP negative.  Has LBBB. 4. Chronic LBBB 5. Right TKR 6. Bilateral THR.  7. Hyperlipidemia  SH: Prior smoker, quit 2/14.  Never drank ETOH.  No drugs. Lives with son.   FH: Mother with "heart trouble."   ROS: All systems reviewed and negative except as per HPI.   Current Outpatient Prescriptions  Medication Sig Dispense Refill  . aspirin EC 81 MG EC tablet Take 1 tablet (81 mg total) by mouth daily.      Marland Kitchen atorvastatin (LIPITOR) 20 MG tablet  Take 1 tablet (20 mg total) by mouth daily.  30 tablet  3  . carvedilol (COREG) 6.25 MG tablet Take 1 tablet (6.25 mg total) by mouth 2 (two) times daily with a meal.  60 tablet  3  . enalapril (VASOTEC) 10 MG tablet Take 1 tablet (10 mg total) by mouth 2 (two) times daily.  60 tablet  3  . furosemide (LASIX) 40 MG tablet 1 tablet (40mg ) in the AM and 1/2 tablet (total 20mg ) in the PM      . glimepiride (AMARYL) 2 MG tablet Take 2 mg by mouth daily before breakfast.      . hydrALAZINE (APRESOLINE) 25 MG tablet Take 1 tablet (25 mg total) by mouth 3 (three) times daily.  90 tablet  3  . HYDROcodone-acetaminophen (NORCO/VICODIN) 5-325 MG per tablet Take 1-2 tablets by mouth every 6 (six) hours as needed for pain.  17 tablet  0  . insulin glargine (LANTUS) 100 UNIT/ML injection Inject 10-40 Units into the skin 2 (two) times daily. Pt takes 10 units in the morning and 40 at night      . isosorbide mononitrate (IMDUR) 30 MG 24 hr tablet Take 1 tablet (30 mg total) by mouth daily.  30 tablet  3  . metFORMIN (GLUCOPHAGE) 1000 MG tablet Take 1 tablet (1,000 mg total) by mouth every evening.      Marland Kitchen  oxyCODONE-acetaminophen (PERCOCET/ROXICET) 5-325 MG per tablet Take 1 tablet by mouth every 4 (four) hours as needed for pain.  20 tablet  0  . spironolactone (ALDACTONE) 25 MG tablet Take 25 mg by mouth daily.       No current facility-administered medications for this encounter.    BP 118/72  Pulse 110  Wt 215 lb (97.523 kg)  BMI 33.67 kg/m2  SpO2 95% General: NAD Neck: JVP 8-9, no thyromegaly or thyroid nodule.  Lungs: Clear to auscultation bilaterally with normal respiratory effort. CV: Nondisplaced PMI.  Heart regular S1/S2, no S3/S4, no murmur.  No edema.  No carotid bruit.  Normal pedal pulses.  Abdomen: Soft, nontender, no hepatosplenomegaly, no distention.  Skin: Intact without lesions or rashes.  Neurologic: Alert and oriented x 3.  Psych: Normal affect. Extremities: No clubbing or cyanosis.    Assessment/Plan: 1. Chronic systolic CHF: Nonischemic cardiomyopathy.  Patient has NYHA class III symptoms.  She remains volume overloaded on exam.  Cause of cardiomyopathy is uncertain.  Possible viral myocarditis, possible HTN-related, possible LBBB cardiomyopathy.  No definite family history.  No drugs or ETOH.  HIV, SPEP/UPEP negative.  - Continue Coreg 6.25 mg twice a day.   - Increase hydralazine to 37.5 mg tid and continue current Imdur.  - Continue enalapril to 10 mg bid.  - Continue  Spironolactone 25 mg daily  - Mild volume overload. Increase  Laisx to 40 mg twice a day. Add 20 meq Kdur daily   - LBBB with QRS > 160 msec => will have evaluation by EP 10/3 for CRT-D.  - Check bmet and pro bnp - Reinforced low salt food choices, daily weights, and medication compliance.  2. Hyperlipidemia: Good lipids on current statin.  3. HTN: BP controlled.   Follow up in 1 month with Dr Greig Castilla NP-C 10/14/2012 9:42 AM  Patient seen with NP, agree with the above note.  She is improved compared to last appointment but still has NYHA class III symptoms.  She remains volume overloaded on exam.  I will increase hydralazine and Lasix, as above.  Will have her see EP for evaluation for CRT-D (wide LBBB).   Marca Ancona 10/14/2012

## 2012-10-14 NOTE — Patient Instructions (Addendum)
Take 20 meq Kdur daily  Take lasix 40 mg twice a day  Take hydralazine 37.5 mg three times a day  Do the following things EVERYDAY: 1) Weigh yourself in the morning before breakfast. Write it down and keep it in a log. 2) Take your medicines as prescribed 3) Eat low salt foods-Limit salt (sodium) to 2000 mg per day.  4) Stay as active as you can everyday 5) Limit all fluids for the day to less than 2 liters  Follow up in 1 months

## 2012-10-19 ENCOUNTER — Ambulatory Visit (INDEPENDENT_AMBULATORY_CARE_PROVIDER_SITE_OTHER): Payer: PRIVATE HEALTH INSURANCE | Admitting: Internal Medicine

## 2012-10-19 ENCOUNTER — Encounter: Payer: Self-pay | Admitting: Internal Medicine

## 2012-10-19 ENCOUNTER — Encounter: Payer: Self-pay | Admitting: *Deleted

## 2012-10-19 VITALS — BP 131/78 | HR 105 | Ht 67.0 in | Wt 220.0 lb

## 2012-10-19 DIAGNOSIS — I10 Essential (primary) hypertension: Secondary | ICD-10-CM

## 2012-10-19 DIAGNOSIS — I5043 Acute on chronic combined systolic (congestive) and diastolic (congestive) heart failure: Secondary | ICD-10-CM

## 2012-10-19 NOTE — Assessment & Plan Note (Signed)
her blood pressure is well controlled. She will continue her current medical therapy.

## 2012-10-19 NOTE — Patient Instructions (Addendum)
Bi-V ICD implant  See instruction sheet

## 2012-10-19 NOTE — Progress Notes (Signed)
HPI [name] is referred today by Dr. Jearld Pies for consideration4 insertion of a biventricular ICD. She has a long-standing history of congestive heart failure, class III, despite maximal medical therapy. She has left bundle branch block. She has never had syncope. She can walk slowly on flat ground, but cannot walk fast, and cannot climb any incline. She has never had syncope. She denies chest pain. She has intermittent peripheral edema. She denies dietary or medical compliance problems.  There is no family history of sudden death. Allergies  Allergen Reactions  . Morphine And Related Nausea And Vomiting    Severe nausea     Current Outpatient Prescriptions  Medication Sig Dispense Refill  . aspirin EC 81 MG EC tablet Take 1 tablet (81 mg total) by mouth daily.      Marland Kitchen atorvastatin (LIPITOR) 20 MG tablet Take 1 tablet (20 mg total) by mouth daily.  30 tablet  3  . carvedilol (COREG) 6.25 MG tablet Take 1 tablet (6.25 mg total) by mouth 2 (two) times daily with a meal.  60 tablet  3  . enalapril (VASOTEC) 10 MG tablet Take 1 tablet (10 mg total) by mouth 2 (two) times daily.  60 tablet  3  . furosemide (LASIX) 40 MG tablet 1 tablet (40mg ) in the AM and 1/2 tablet (total 20mg ) in the PM      . glimepiride (AMARYL) 2 MG tablet Take 2 mg by mouth daily before breakfast.      . hydrALAZINE (APRESOLINE) 25 MG tablet Take 1.5 tablets (37.5 mg total) by mouth 3 (three) times daily.  140 tablet  3  . HYDROcodone-acetaminophen (NORCO/VICODIN) 5-325 MG per tablet Take 1-2 tablets by mouth every 6 (six) hours as needed for pain.  17 tablet  0  . insulin glargine (LANTUS) 100 UNIT/ML injection Inject 10-40 Units into the skin 2 (two) times daily. Pt takes 10 units in the morning and 40 at night      . isosorbide mononitrate (IMDUR) 30 MG 24 hr tablet Take 1 tablet (30 mg total) by mouth daily.  30 tablet  3  . metFORMIN (GLUCOPHAGE) 1000 MG tablet Take 1 tablet (1,000 mg total) by mouth every evening.       Marland Kitchen oxyCODONE-acetaminophen (PERCOCET/ROXICET) 5-325 MG per tablet Take 1 tablet by mouth every 4 (four) hours as needed for pain.  20 tablet  0  . potassium chloride (K-DUR) 10 MEQ tablet Take 2 tablets (20 mEq total) by mouth daily.  30 tablet  3  . spironolactone (ALDACTONE) 25 MG tablet Take 25 mg by mouth daily.       No current facility-administered medications for this visit.     Past Medical History  Diagnosis Date  . Diabetes mellitus   . Hypertension   . CAD (coronary artery disease), native coronary artery     a. Nonobstructive by cath 02/2012 (done because of low EF).  . Tobacco abuse   . Chronic combined systolic and diastolic CHF (congestive heart failure)     a. 03/05/12 echo:  LVEF 20-25%, moderate LVH , inferior and basal to mid septal akinesis, anterior moderate to severe hypokinesis and grade 2 diastolic dysfunction. b. EF 07/2012: EF still 25% (unclear medication compliance).  . Anemia     a. Noted on 07/2012 labs, instructed to f/u PCP.  Marland Kitchen LBBB (left bundle branch block)   . History of noncompliance with medical treatment     ROS:   All systems reviewed and negative except as  noted in the HPI.   Past Surgical History  Procedure Laterality Date  . Joint replacement      Bilateral hip and right knee  . Cardiac catheterization  03/04/12    nonobstructive CAD, elevated LVEDP and tortuous vessels suggestive of long-standing hypertension     Family History  Problem Relation Age of Onset  . Other      Denies family history of CAD     History   Social History  . Marital Status: Single    Spouse Name: N/A    Number of Children: N/A  . Years of Education: N/A   Occupational History  . Not on file.   Social History Main Topics  . Smoking status: Former Smoker -- 1.00 packs/day for 40 years    Types: Cigarettes    Start date: 06/23/1972    Quit date: 03/26/2012  . Smokeless tobacco: Never Used  . Alcohol Use: No  . Drug Use: No  . Sexual Activity: Not  on file   Other Topics Concern  . Not on file   Social History Narrative  . No narrative on file     BP 131/78  Pulse 105  Ht 5\' 7"  (1.702 m)  Wt 220 lb (99.791 kg)  BMI 34.45 kg/m2  Physical Exam:  Well appearing 62 year old woman, NAD HEENT: Unremarkable Neck:  7 cm JVD, no thyromegally Back:  No CVA tenderness Lungs:  Clear with no wheezes, rales, or rhonchi. HEART:  Regular rate rhythm, no murmurs, no rubs, no clicks Abd:  soft, positive bowel sounds, no organomegally, no rebound, no guarding Ext:  2 plus pulses, no edema, no cyanosis, no clubbing Skin:  No rashes no nodules Neuro:  CN II through XII intact, motor grossly intact  EKG - sinus tachycardia with left bundle branch block, QRS duration 158 ms.   Assess/Plan:

## 2012-10-19 NOTE — Assessment & Plan Note (Signed)
Under her primary cardiologist direction, the patient has been maximally treated with medical therapy. Despite this, she has class III symptoms, an ejection fraction of 25%, and ongoing left bundle branch block. The risk, goals, benefits, and expectations of insertion of a biventricular ICD have been discussed with the patient and she wishes to proceed. This will be scheduled early as possible pending at time.

## 2012-10-19 NOTE — Addendum Note (Signed)
Addended by: Dennis Bast F on: 10/19/2012 02:05 PM   Modules accepted: Orders

## 2012-10-20 ENCOUNTER — Other Ambulatory Visit: Payer: PRIVATE HEALTH INSURANCE

## 2012-10-29 ENCOUNTER — Institutional Professional Consult (permissible substitution): Payer: PRIVATE HEALTH INSURANCE | Admitting: Internal Medicine

## 2012-11-08 ENCOUNTER — Ambulatory Visit: Payer: PRIVATE HEALTH INSURANCE | Admitting: Cardiology

## 2012-11-11 ENCOUNTER — Other Ambulatory Visit: Payer: PRIVATE HEALTH INSURANCE

## 2012-11-12 ENCOUNTER — Ambulatory Visit (HOSPITAL_COMMUNITY)
Admission: RE | Admit: 2012-11-12 | Discharge: 2012-11-12 | Disposition: A | Discharge status: Home / Self Care | Payer: PRIVATE HEALTH INSURANCE | Source: Ambulatory Visit | Attending: Internal Medicine | Admitting: Internal Medicine

## 2012-11-12 VITALS — BP 132/78 | HR 118 | Wt 222.0 lb

## 2012-11-12 DIAGNOSIS — Z79899 Other long term (current) drug therapy: Secondary | ICD-10-CM | POA: Insufficient documentation

## 2012-11-12 DIAGNOSIS — I447 Left bundle-branch block, unspecified: Secondary | ICD-10-CM | POA: Insufficient documentation

## 2012-11-12 DIAGNOSIS — I5022 Chronic systolic (congestive) heart failure: Secondary | ICD-10-CM

## 2012-11-12 DIAGNOSIS — E119 Type 2 diabetes mellitus without complications: Secondary | ICD-10-CM | POA: Insufficient documentation

## 2012-11-12 DIAGNOSIS — Z96659 Presence of unspecified artificial knee joint: Secondary | ICD-10-CM | POA: Insufficient documentation

## 2012-11-12 DIAGNOSIS — E785 Hyperlipidemia, unspecified: Secondary | ICD-10-CM | POA: Insufficient documentation

## 2012-11-12 DIAGNOSIS — I10 Essential (primary) hypertension: Secondary | ICD-10-CM | POA: Insufficient documentation

## 2012-11-12 DIAGNOSIS — I509 Heart failure, unspecified: Secondary | ICD-10-CM

## 2012-11-12 DIAGNOSIS — Z7982 Long term (current) use of aspirin: Secondary | ICD-10-CM | POA: Insufficient documentation

## 2012-11-12 DIAGNOSIS — I428 Other cardiomyopathies: Secondary | ICD-10-CM | POA: Insufficient documentation

## 2012-11-12 LAB — BASIC METABOLIC PANEL
BUN: 13 mg/dL (ref 6–23)
CO2: 27 mEq/L (ref 19–32)
Calcium: 9.5 mg/dL (ref 8.4–10.5)
Chloride: 102 mEq/L (ref 96–112)
Creatinine, Ser: 0.82 mg/dL (ref 0.50–1.10)
GFR calc Af Amer: 89 mL/min — ABNORMAL LOW (ref >90)
GFR calc non Af Amer: 77 mL/min — ABNORMAL LOW (ref >90)
Glucose, Bld: 120 mg/dL — ABNORMAL HIGH (ref 70–99)
Potassium: 4.2 mEq/L (ref 3.5–5.1)
Sodium: 140 mEq/L (ref 135–145)

## 2012-11-12 LAB — PRO B NATRIURETIC PEPTIDE: Pro B Natriuretic peptide (BNP): 468.3 pg/mL — ABNORMAL HIGH (ref 0–125)

## 2012-11-12 MED ORDER — CARVEDILOL 6.25 MG PO TABS: 6.2500 mg | ORAL_TABLET | Freq: Two times a day (BID) | ORAL | Status: DC

## 2012-11-12 MED ORDER — SPIRONOLACTONE 25 MG PO TABS: 25.0000 mg | ORAL_TABLET | Freq: Every day | ORAL | Status: DC

## 2012-11-12 MED ORDER — ATORVASTATIN CALCIUM 20 MG PO TABS: 20.0000 mg | ORAL_TABLET | Freq: Every day | ORAL | Status: DC

## 2012-11-12 MED ORDER — ISOSORBIDE MONONITRATE ER 30 MG PO TB24: 30.0000 mg | ORAL_TABLET | Freq: Every day | ORAL | Status: DC

## 2012-11-12 NOTE — Progress Notes (Signed)
Patient ID: [name], female   DOB: 1950-04-15, 62 y.o.   MRN: [id number] PCP: Dr. Mikeal Hawthorne  62 yo with history of nonischemic cardiomyopathy presents for cardiology followup.  She was admitted with CHF exacerbation in 2/14.  EF 20-25% on echo, LHC with nonobstructive CAD.  She was started on cardiac meds and discharged.  In 7/14, she was admitted again with CHF exacerbation.  She had run out of Lasix.  She was taking her other heart medications as ordered, however.  She was diuresed and discharged.  Repeat echo in 7/14 showed that EF remained 25%.  She has a chronic LBBB.    She returns for follow up. Last visit hydralazine was increased to 37.5 mg tid. She has been out of carvedilol and Imdur for the past few days and she is only taking 12.5 mg spironolactone daily. Mild dyspnea with exertion. Remains SOB going up steps. + Orthopnea. Sleeps on 2 pillows. Not weighing at home because she does have a scale. Drinking > 2 liters. Eating high salt foods.     Labs (2/14): SPEP negative, UPEP negative, HIV negative Labs (7/14): K 4, creatinine 1.0, LDL 66 Labs (08/25/12): K 4, creatinine 1.0 Labs (10/06/12) : K 3.3 creatinine 0.7 pro-bnp 188  PMH: 1. HTN 2. Type II diabetes 3. Nonischemic cardiomyopathy: ? Due to HTN versus LBBB CMP.  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  Echo (2/14) with EF 20-25%.  Echo (7/14) with EF 25%, diffuse hypokinesis.  HIV, SPEP, UPEP negative.  Has LBBB. 4. Chronic LBBB 5. Right TKR 6. Bilateral THR.  7. Hyperlipidemia  SH: Prior smoker, quit 2/14.  Never drank ETOH.  No drugs. Lives with son.   FH: Mother with "heart trouble."   ROS: All systems reviewed and negative except as per HPI.   Current Outpatient Prescriptions  Medication Sig Dispense Refill  . aspirin EC 81 MG EC tablet Take 1 tablet (81 mg total) by mouth daily.      . enalapril (VASOTEC) 10 MG tablet Take 1 tablet (10 mg total) by mouth 2 (two) times daily.  60 tablet  3  . furosemide (LASIX) 40 MG  tablet 1 tablet (40mg ) in the AM and 1/2 tablet (total 20mg ) in the PM      . glimepiride (AMARYL) 2 MG tablet Take 2 mg by mouth daily before breakfast.      . hydrALAZINE (APRESOLINE) 25 MG tablet Take 1.5 tablets (37.5 mg total) by mouth 3 (three) times daily.  140 tablet  3  . HYDROcodone-acetaminophen (NORCO/VICODIN) 5-325 MG per tablet Take 1-2 tablets by mouth every 6 (six) hours as needed for pain.  17 tablet  0  . insulin glargine (LANTUS) 100 UNIT/ML injection Inject 10-40 Units into the skin 2 (two) times daily. Pt takes 10 units in the morning and 40 at night      . metFORMIN (GLUCOPHAGE) 1000 MG tablet Take 1 tablet (1,000 mg total) by mouth every evening.      . potassium chloride (K-DUR) 10 MEQ tablet Take 2 tablets (20 mEq total) by mouth daily.  30 tablet  3  . spironolactone (ALDACTONE) 25 MG tablet Take 12.5 mg by mouth daily.       Marland Kitchen atorvastatin (LIPITOR) 20 MG tablet Take 1 tablet (20 mg total) by mouth daily.  30 tablet  3  . carvedilol (COREG) 6.25 MG tablet Take 1 tablet (6.25 mg total) by mouth 2 (two) times daily with a meal.  60 tablet  3  .  isosorbide mononitrate (IMDUR) 30 MG 24 hr tablet Take 1 tablet (30 mg total) by mouth daily.  30 tablet  3   No current facility-administered medications for this encounter.    BP 132/78  Pulse 118  Wt 222 lb (100.699 kg)  BMI 34.76 kg/m2  SpO2 98% General: NAD Neck: JVP 8-9, no thyromegaly or thyroid nodule.  Lungs: Clear to auscultation bilaterally with normal respiratory effort. CV: Nondisplaced PMI.  Heart regular S1/S2, no S3/S4, no murmur.  No edema.  No carotid bruit.  Normal pedal pulses.  Abdomen: Soft, nontender, no hepatosplenomegaly, no distention.  Skin: Intact without lesions or rashes.  Neurologic: Alert and oriented x 3.   Psych: Normal affect. Extremities: No clubbing or cyanosis.   EKG- NSR LBBB 98  Assessment/Plan: 1. Chronic systolic CHF: Nonischemic cardiomyopathy.  Patient has NYHA class III  symptoms.  She remains volume overloaded on exam.  Cause of cardiomyopathy is uncertain.  Possible viral myocarditis, possible HTN-related, possible LBBB cardiomyopathy.  No definite family history.  No drugs or ETOH.  HIV, SPEP/UPEP negative.  - She has been out of  Coreg and Imdur. Will restart 6.25 mg twice a day.   - Continue hydralazine to 37.5 mg tid and will restart Imdur 30 mg daily. reorder  Imdur.  - Continue enalapril to 10 mg bid.  - Increase Spironolactone 25 mg daily  - Mild volume overload. Increase  Laisx to 40 mg twice a day. Add 20 meq Kdur daily   - LBBB with QRS > 160 msec => EP planning CRT-D 11/18/12 Provided scale and weight chart for home use.  Check BMET and Pro BNP today.  - Reinforced low salt food choices, daily weights, and medication compliance.  2. Hyperlipidemia: Out of statin. Reordered atorvastatin.  3. HTN: BP controlled.   Follow up in 1 month with Dr Greig Castilla NP-C 11/13/2018 9:41 AM

## 2012-11-12 NOTE — Patient Instructions (Signed)
Follow up 1 month  Take 25 mg spironolactone daily  Do the following things EVERYDAY: 1) Weigh yourself in the morning before breakfast. Write it down and keep it in a log. 2) Take your medicines as prescribed 3) Eat low salt foods-Limit salt (sodium) to 2000 mg per day.  4) Stay as active as you can everyday 5) Limit all fluids for the day to less than 2 liters

## 2012-11-13 NOTE — Addendum Note (Signed)
Encounter addended by: Simon Rhein, CCT on: 11/13/2012 11:12 AM<BR>     Documentation filed: Charges VN

## 2012-11-15 ENCOUNTER — Emergency Department (HOSPITAL_COMMUNITY)
Admission: EM | Admit: 2012-11-15 | Discharge: 2012-11-15 | Disposition: A | Discharge status: Home / Self Care | Payer: PRIVATE HEALTH INSURANCE | Attending: Emergency Medicine | Admitting: Emergency Medicine

## 2012-11-15 ENCOUNTER — Other Ambulatory Visit: Payer: Self-pay

## 2012-11-15 ENCOUNTER — Encounter (HOSPITAL_COMMUNITY): Payer: Self-pay | Admitting: Emergency Medicine

## 2012-11-15 ENCOUNTER — Encounter (HOSPITAL_COMMUNITY): Payer: Self-pay | Admitting: Pharmacy Technician

## 2012-11-15 DIAGNOSIS — Z91199 Patient's noncompliance with other medical treatment and regimen due to unspecified reason: Secondary | ICD-10-CM | POA: Insufficient documentation

## 2012-11-15 DIAGNOSIS — I5042 Chronic combined systolic (congestive) and diastolic (congestive) heart failure: Secondary | ICD-10-CM | POA: Insufficient documentation

## 2012-11-15 DIAGNOSIS — I2510 Atherosclerotic heart disease of native coronary artery without angina pectoris: Secondary | ICD-10-CM | POA: Insufficient documentation

## 2012-11-15 DIAGNOSIS — Z794 Long term (current) use of insulin: Secondary | ICD-10-CM | POA: Insufficient documentation

## 2012-11-15 DIAGNOSIS — E119 Type 2 diabetes mellitus without complications: Secondary | ICD-10-CM | POA: Insufficient documentation

## 2012-11-15 DIAGNOSIS — R112 Nausea with vomiting, unspecified: Secondary | ICD-10-CM | POA: Insufficient documentation

## 2012-11-15 DIAGNOSIS — Z9119 Patient's noncompliance with other medical treatment and regimen: Secondary | ICD-10-CM | POA: Insufficient documentation

## 2012-11-15 DIAGNOSIS — I10 Essential (primary) hypertension: Secondary | ICD-10-CM | POA: Insufficient documentation

## 2012-11-15 DIAGNOSIS — R197 Diarrhea, unspecified: Secondary | ICD-10-CM | POA: Insufficient documentation

## 2012-11-15 DIAGNOSIS — Z87891 Personal history of nicotine dependence: Secondary | ICD-10-CM | POA: Insufficient documentation

## 2012-11-15 DIAGNOSIS — Z9889 Other specified postprocedural states: Secondary | ICD-10-CM | POA: Insufficient documentation

## 2012-11-15 DIAGNOSIS — Z862 Personal history of diseases of the blood and blood-forming organs and certain disorders involving the immune mechanism: Secondary | ICD-10-CM | POA: Insufficient documentation

## 2012-11-15 DIAGNOSIS — R55 Syncope and collapse: Secondary | ICD-10-CM | POA: Insufficient documentation

## 2012-11-15 DIAGNOSIS — Z79899 Other long term (current) drug therapy: Secondary | ICD-10-CM | POA: Insufficient documentation

## 2012-11-15 DIAGNOSIS — Z7982 Long term (current) use of aspirin: Secondary | ICD-10-CM | POA: Insufficient documentation

## 2012-11-15 DIAGNOSIS — R1013 Epigastric pain: Secondary | ICD-10-CM | POA: Insufficient documentation

## 2012-11-15 LAB — CBC WITH DIFFERENTIAL/PLATELET
Basophils Absolute: 0 10*3/uL (ref 0.0–0.1)
Basophils Relative: 0 % (ref 0–1)
Eosinophils Absolute: 0.2 10*3/uL (ref 0.0–0.7)
Eosinophils Relative: 1 % (ref 0–5)
HCT: 32.7 % — ABNORMAL LOW (ref 36.0–46.0)
Hemoglobin: 11.8 g/dL — ABNORMAL LOW (ref 12.0–15.0)
Lymphocytes Relative: 10 % — ABNORMAL LOW (ref 12–46)
Lymphs Abs: 1.9 10*3/uL (ref 0.7–4.0)
MCH: 31.6 pg (ref 26.0–34.0)
MCHC: 36.1 g/dL — ABNORMAL HIGH (ref 30.0–36.0)
MCV: 87.7 fL (ref 78.0–100.0)
Monocytes Absolute: 1.9 10*3/uL — ABNORMAL HIGH (ref 0.1–1.0)
Monocytes Relative: 10 % (ref 3–12)
Neutro Abs: 14.5 10*3/uL — ABNORMAL HIGH (ref 1.7–7.7)
Neutrophils Relative %: 79 % — ABNORMAL HIGH (ref 43–77)
Platelets: 384 10*3/uL (ref 150–400)
RBC: 3.73 MIL/uL — ABNORMAL LOW (ref 3.87–5.11)
RDW: 12.5 % (ref 11.5–15.5)
WBC: 18.5 10*3/uL — ABNORMAL HIGH (ref 4.0–10.5)

## 2012-11-15 LAB — COMPREHENSIVE METABOLIC PANEL
ALT: 8 U/L (ref 0–35)
AST: 12 U/L (ref 0–37)
Albumin: 4 g/dL (ref 3.5–5.2)
Alkaline Phosphatase: 63 U/L (ref 39–117)
BUN: 26 mg/dL — ABNORMAL HIGH (ref 6–23)
CO2: 28 mEq/L (ref 19–32)
Calcium: 9.7 mg/dL (ref 8.4–10.5)
Chloride: 103 mEq/L (ref 96–112)
Creatinine, Ser: 1.04 mg/dL (ref 0.50–1.10)
GFR calc Af Amer: 67 mL/min — ABNORMAL LOW (ref >90)
GFR calc non Af Amer: 58 mL/min — ABNORMAL LOW (ref >90)
Glucose, Bld: 144 mg/dL — ABNORMAL HIGH (ref 70–99)
Potassium: 4.4 mEq/L (ref 3.5–5.1)
Sodium: 142 mEq/L (ref 135–145)
Total Bilirubin: 0.3 mg/dL (ref 0.3–1.2)
Total Protein: 7.6 g/dL (ref 6.0–8.3)

## 2012-11-15 LAB — LIPASE, BLOOD: Lipase: 31 U/L (ref 11–59)

## 2012-11-15 MED ORDER — METOCLOPRAMIDE HCL 5 MG/ML IJ SOLN
10.0000 mg | Freq: Once | INTRAMUSCULAR | Status: AC
  Administered 2012-11-15: 10 mg via INTRAVENOUS
  Filled 2012-11-15: qty 2

## 2012-11-15 MED ORDER — ONDANSETRON HCL 4 MG PO TABS: 4.0000 mg | ORAL_TABLET | Freq: Four times a day (QID) | ORAL | Status: DC

## 2012-11-15 MED ORDER — DIPHENHYDRAMINE HCL 50 MG/ML IJ SOLN
25.0000 mg | Freq: Once | INTRAMUSCULAR | Status: AC
  Administered 2012-11-15: 25 mg via INTRAVENOUS
  Filled 2012-11-15: qty 1

## 2012-11-15 MED ORDER — SODIUM CHLORIDE 0.9 % IV BOLUS (SEPSIS)
500.0000 mL | Freq: Once | INTRAVENOUS | Status: AC
  Administered 2012-11-15: 500 mL via INTRAVENOUS

## 2012-11-15 NOTE — ED Notes (Addendum)
Presents to ED via EMS with c/o dizziness, onset x4 days. Pt states she has been placed on new BP meds. Pt states she went to PCP today for dizziness and was sent back home. Pt states she took a nap wake with dizziness and nausea and vomiting. Per EMS, CBG-153, HR-104 (left bundle branch block), BP-122/72.

## 2012-11-15 NOTE — ED Provider Notes (Signed)
CSN: [id number]     Arrival date & time 11/15/12  1837 History   First MD Initiated Contact with Patient 11/15/12 1838     Chief Complaint  Patient presents with  . Dizziness   (Consider location/radiation/quality/duration/timing/severity/associated sxs/prior Treatment) HPI  Patient reports this morning she gotten up and was trying to get ready for a regular routine doctor's appointment and she started having nausea and epigastric abdominal pain. She reports she went to the bathroom and  she couldn't see because everything got black  and she fell and had some loss of consciousness that was a brief period. She denies injury from the syncope.  She complains of persistent nausea and she started vomiting when she returned home from her doctor's appointment. She states she's vomited at least 10 times and she has had diarrhea throughout the day about 12 times also. She states the diarrhea is loose. She denies seeing blood in her emesis or diarrhea. She denies any fever. She denies being around anybody else is ill. She denies eating anything that could have made her ill. She states she feels dizzy and lightheaded. She states she has an achiness in her abdomen when she has the vomiting.  PCP Dr Mikeal Hawthorne  Past Medical History  Diagnosis Date  . Diabetes mellitus   . Hypertension   . CAD (coronary artery disease), native coronary artery     a. Nonobstructive by cath 02/2012 (done because of low EF).  . Tobacco abuse   . Chronic combined systolic and diastolic CHF (congestive heart failure)     a. 03/05/12 echo:  LVEF 20-25%, moderate LVH , inferior and basal to mid septal akinesis, anterior moderate to severe hypokinesis and grade 2 diastolic dysfunction. b. EF 07/2012: EF still 25% (unclear medication compliance).  . Anemia     a. Noted on 07/2012 labs, instructed to f/u PCP.  Marland Kitchen LBBB (left bundle branch block)   . History of noncompliance with medical treatment    Past Surgical History  Procedure  Laterality Date  . Joint replacement      Bilateral hip and right knee  . Cardiac catheterization  03/04/12    nonobstructive CAD, elevated LVEDP and tortuous vessels suggestive of long-standing hypertension   Family History  Problem Relation Age of Onset  . Other      Denies family history of CAD   History  Substance Use Topics  . Smoking status: Former Smoker -- 1.00 packs/day for 40 years    Types: Cigarettes    Start date: 06/23/1972    Quit date: 03/26/2012  . Smokeless tobacco: Never Used  . Alcohol Use: No  on disability for a fall with hip fractures requiring replacement   OB History   Grav Para Term Preterm Abortions TAB SAB Ect Mult Living                 Review of Systems  All other systems reviewed and are negative.    Allergies  Morphine and related  Home Medications   Current Outpatient Rx  Name  Route  Sig  Dispense  Refill  . aspirin EC 81 MG tablet   Oral   Take 81 mg by mouth daily.         Marland Kitchen atorvastatin (LIPITOR) 20 MG tablet   Oral   Take 20 mg by mouth daily.         . carvedilol (COREG) 6.25 MG tablet   Oral   Take 6.25 mg by mouth 2 (two)  times daily with a meal.         . enalapril (VASOTEC) 10 MG tablet   Oral   Take 10 mg by mouth 2 (two) times daily.         . furosemide (LASIX) 40 MG tablet   Oral   Take 20-40 mg by mouth daily. 1 tablet in the morning and 0.5 tablet in the evening.         Marland Kitchen glimepiride (AMARYL) 2 MG tablet   Oral   Take 2 mg by mouth daily before breakfast.         . hydrALAZINE (APRESOLINE) 25 MG tablet   Oral   Take 37.5 mg by mouth 3 (three) times daily.         Marland Kitchen HYDROcodone-acetaminophen (NORCO) 10-325 MG per tablet   Oral   Take 1-2 tablets by mouth daily as needed for pain.         Marland Kitchen insulin glargine (LANTUS) 100 UNIT/ML injection   Subcutaneous   Inject 10-40 Units into the skin 2 (two) times daily. Pt takes 10 units in the morning and 40 at night         . isosorbide  mononitrate (IMDUR) 30 MG 24 hr tablet   Oral   Take 30 mg by mouth daily.         . metFORMIN (GLUCOPHAGE) 1000 MG tablet   Oral   Take 1,000 mg by mouth 2 (two) times daily with a meal.         . potassium chloride (K-DUR) 10 MEQ tablet   Oral   Take 20 mEq by mouth daily.         Marland Kitchen spironolactone (ALDACTONE) 25 MG tablet   Oral   Take 25 mg by mouth daily.          BP 115/80  Pulse 103  Temp(Src) 98.4 F (36.9 C) (Oral)  Resp 21  SpO2 94%  Vital signs normal except tachycardia  Physical Exam  Nursing note and vitals reviewed. Constitutional: She is oriented to person, place, and time. She appears well-developed and well-nourished.  Non-toxic appearance. She does not appear ill. No distress.  HENT:  Head: Normocephalic and atraumatic.  Right Ear: External ear normal.  Left Ear: External ear normal.  Nose: Nose normal. No mucosal edema or rhinorrhea.  Mouth/Throat: Oropharynx is clear and moist and mucous membranes are normal. No dental abscesses or uvula swelling.  Eyes: Conjunctivae and EOM are normal. Pupils are equal, round, and reactive to light.  Neck: Normal range of motion and full passive range of motion without pain. Neck supple.  Cardiovascular: Normal rate, regular rhythm and normal heart sounds.  Exam reveals no gallop and no friction rub.   No murmur heard. Pulmonary/Chest: Effort normal and breath sounds normal. No respiratory distress. She has no wheezes. She has no rhonchi. She has no rales. She exhibits no tenderness and no crepitus.  Abdominal: Soft. Normal appearance and bowel sounds are normal. She exhibits no distension. There is tenderness in the epigastric area. There is no rebound and no guarding.    Musculoskeletal: Normal range of motion. She exhibits no edema and no tenderness.  Moves all extremities well.   Neurological: She is alert and oriented to person, place, and time. She has normal strength. No cranial nerve deficit.  Skin:  Skin is warm, dry and intact. No rash noted. No erythema. No pallor.  Psychiatric: She has a normal mood and affect. Her speech is normal  and behavior is normal. Her mood appears not anxious.    ED Course  Procedures (including critical care time)  Medications  metoCLOPramide (REGLAN) injection 10 mg (10 mg Intravenous Given 11/15/12 1930)  diphenhydrAMINE (BENADRYL) injection 25 mg (25 mg Intravenous Given 11/15/12 1931)  sodium chloride 0.9 % bolus 500 mL (500 mLs Intravenous New Bag/Given 11/15/12 1933)    20:30 Recheck, feeling much better. States her abdominal pain and nausea are gone. Didn't want to try to drink fluids, but has had no UO ( I gave small fluids b/o her hx of CHF). Have encouraged her to try to drink so we can tell if she is ready to be discharged.   21:45 pt sleeping, has been able to drink without feeling worse. States her nausea and abdominal pain are gone.   Labs Review Results for orders placed during the hospital encounter of 11/15/12  CBC WITH DIFFERENTIAL      Result Value Range   WBC 18.5 (*) 4.0 - 10.5 K/uL   RBC 3.73 (*) 3.87 - 5.11 MIL/uL   Hemoglobin 11.8 (*) 12.0 - 15.0 g/dL   HCT 16.1 (*) 09.6 - 04.5 %   MCV 87.7  78.0 - 100.0 fL   MCH 31.6  26.0 - 34.0 pg   MCHC 36.1 (*) 30.0 - 36.0 g/dL   RDW 40.9  81.1 - 91.4 %   Platelets 384  150 - 400 K/uL   Neutrophils Relative % 79 (*) 43 - 77 %   Lymphocytes Relative 10 (*) 12 - 46 %   Monocytes Relative 10  3 - 12 %   Eosinophils Relative 1  0 - 5 %   Basophils Relative 0  0 - 1 %   Neutro Abs 14.5 (*) 1.7 - 7.7 K/uL   Lymphs Abs 1.9  0.7 - 4.0 K/uL   Monocytes Absolute 1.9 (*) 0.1 - 1.0 K/uL   Eosinophils Absolute 0.2  0.0 - 0.7 K/uL   Basophils Absolute 0.0  0.0 - 0.1 K/uL   Smear Review MORPHOLOGY UNREMARKABLE    COMPREHENSIVE METABOLIC PANEL      Result Value Range   Sodium 142  135 - 145 mEq/L   Potassium 4.4  3.5 - 5.1 mEq/L   Chloride 103  96 - 112 mEq/L   CO2 28  19 - 32 mEq/L    Glucose, Bld 144 (*) 70 - 99 mg/dL   BUN 26 (*) 6 - 23 mg/dL   Creatinine, Ser 7.82  0.50 - 1.10 mg/dL   Calcium 9.7  8.4 - 95.6 mg/dL   Total Protein 7.6  6.0 - 8.3 g/dL   Albumin 4.0  3.5 - 5.2 g/dL   AST 12  0 - 37 U/L   ALT 8  0 - 35 U/L   Alkaline Phosphatase 63  39 - 117 U/L   Total Bilirubin 0.3  0.3 - 1.2 mg/dL   GFR calc non Af Amer 58 (*) >90 mL/min   GFR calc Af Amer 67 (*) >90 mL/min  LIPASE, BLOOD      Result Value Range   Lipase 31  11 - 59 U/L   Laboratory interpretation all normal except mild anemia    MDM   1. Nausea vomiting and diarrhea    New Prescriptions   ONDANSETRON (ZOFRAN) 4 MG TABLET    Take 1 tablet (4 mg total) by mouth every 6 (six) hours.    Plan discharge   Devoria Albe, MD, Armando Gang  Ward Givens, MD 11/15/12 2200

## 2012-11-17 MED ORDER — SODIUM CHLORIDE 0.9 % IR SOLN
80.0000 mg | Status: DC
  Filled 2012-11-17: qty 2

## 2012-11-17 MED ORDER — CEFAZOLIN SODIUM-DEXTROSE 2-3 GM-% IV SOLR
2.0000 g | INTRAVENOUS | Status: DC
  Filled 2012-11-17: qty 50

## 2012-11-18 ENCOUNTER — Encounter (HOSPITAL_COMMUNITY): Payer: Self-pay | Admitting: *Deleted

## 2012-11-18 ENCOUNTER — Encounter (HOSPITAL_COMMUNITY)
Admission: RE | Disposition: A | Discharge status: Home / Self Care | Payer: Self-pay | Source: Ambulatory Visit | Attending: Internal Medicine

## 2012-11-18 ENCOUNTER — Ambulatory Visit (HOSPITAL_COMMUNITY)
Admission: RE | Admit: 2012-11-18 | Discharge: 2012-11-19 | Disposition: A | Discharge status: Home / Self Care | Payer: PRIVATE HEALTH INSURANCE | Source: Ambulatory Visit | Attending: Internal Medicine | Admitting: Internal Medicine

## 2012-11-18 DIAGNOSIS — Z794 Long term (current) use of insulin: Secondary | ICD-10-CM | POA: Insufficient documentation

## 2012-11-18 DIAGNOSIS — I428 Other cardiomyopathies: Secondary | ICD-10-CM | POA: Insufficient documentation

## 2012-11-18 DIAGNOSIS — I5022 Chronic systolic (congestive) heart failure: Secondary | ICD-10-CM

## 2012-11-18 DIAGNOSIS — E119 Type 2 diabetes mellitus without complications: Secondary | ICD-10-CM

## 2012-11-18 DIAGNOSIS — R0902 Hypoxemia: Secondary | ICD-10-CM

## 2012-11-18 DIAGNOSIS — I10 Essential (primary) hypertension: Secondary | ICD-10-CM

## 2012-11-18 DIAGNOSIS — I509 Heart failure, unspecified: Secondary | ICD-10-CM | POA: Insufficient documentation

## 2012-11-18 DIAGNOSIS — I5043 Acute on chronic combined systolic (congestive) and diastolic (congestive) heart failure: Secondary | ICD-10-CM

## 2012-11-18 DIAGNOSIS — G4730 Sleep apnea, unspecified: Secondary | ICD-10-CM

## 2012-11-18 DIAGNOSIS — I447 Left bundle-branch block, unspecified: Secondary | ICD-10-CM | POA: Insufficient documentation

## 2012-11-18 DIAGNOSIS — Z79899 Other long term (current) drug therapy: Secondary | ICD-10-CM | POA: Insufficient documentation

## 2012-11-18 DIAGNOSIS — Z720 Tobacco use: Secondary | ICD-10-CM

## 2012-11-18 DIAGNOSIS — J40 Bronchitis, not specified as acute or chronic: Secondary | ICD-10-CM

## 2012-11-18 DIAGNOSIS — I119 Hypertensive heart disease without heart failure: Secondary | ICD-10-CM

## 2012-11-18 DIAGNOSIS — I2510 Atherosclerotic heart disease of native coronary artery without angina pectoris: Secondary | ICD-10-CM | POA: Insufficient documentation

## 2012-11-18 DIAGNOSIS — R079 Chest pain, unspecified: Secondary | ICD-10-CM

## 2012-11-18 DIAGNOSIS — E876 Hypokalemia: Secondary | ICD-10-CM

## 2012-11-18 HISTORY — DX: Unspecified osteoarthritis, unspecified site: M19.90

## 2012-11-18 HISTORY — DX: Other chronic pain: G89.29

## 2012-11-18 HISTORY — DX: Unspecified chronic bronchitis: J42

## 2012-11-18 HISTORY — DX: Type 2 diabetes mellitus without complications: E11.9

## 2012-11-18 HISTORY — DX: Low back pain, unspecified: M54.50

## 2012-11-18 HISTORY — DX: Orthopnea: R06.01

## 2012-11-18 HISTORY — DX: Pure hypercholesterolemia, unspecified: E78.00

## 2012-11-18 HISTORY — PX: BI-VENTRICULAR IMPLANTABLE CARDIOVERTER DEFIBRILLATOR: SHX5459

## 2012-11-18 HISTORY — PX: BI-VENTRICULAR IMPLANTABLE CARDIOVERTER DEFIBRILLATOR  (CRT-D): SHX5747

## 2012-11-18 HISTORY — DX: Headache: R51

## 2012-11-18 HISTORY — DX: Low back pain: M54.5

## 2012-11-18 LAB — GLUCOSE, CAPILLARY
Glucose-Capillary: 126 mg/dL — ABNORMAL HIGH (ref 70–99)
Glucose-Capillary: 177 mg/dL — ABNORMAL HIGH (ref 70–99)
Glucose-Capillary: 160 mg/dL — ABNORMAL HIGH (ref 70–99)

## 2012-11-18 LAB — SURGICAL PCR SCREEN
MRSA, PCR: NEGATIVE
Staphylococcus aureus: NEGATIVE

## 2012-11-18 SURGERY — BI-VENTRICULAR IMPLANTABLE CARDIOVERTER DEFIBRILLATOR  (CRT-D)
Anesthesia: LOCAL

## 2012-11-18 MED ORDER — MUPIROCIN 2 % EX OINT
TOPICAL_OINTMENT | Freq: Once | CUTANEOUS | Status: DC
Start: 2012-11-18 — End: 2012-11-18
  Filled 2012-11-18 (×2): qty 22

## 2012-11-18 MED ORDER — ONDANSETRON HCL 4 MG/2ML IJ SOLN: 4.0000 mg | Freq: Four times a day (QID) | INTRAMUSCULAR | Status: DC | PRN

## 2012-11-18 MED ORDER — TEMAZEPAM 7.5 MG PO CAPS
7.5000 mg | ORAL_CAPSULE | Freq: Every evening | ORAL | Status: DC | PRN
  Administered 2012-11-19: 7.5 mg via ORAL
  Filled 2012-11-18: qty 1

## 2012-11-18 MED ORDER — INSULIN GLARGINE 100 UNIT/ML ~~LOC~~ SOLN: 10.0000 [IU] | Freq: Two times a day (BID) | SUBCUTANEOUS | Status: DC

## 2012-11-18 MED ORDER — FUROSEMIDE 20 MG PO TABS: 20.0000 mg | ORAL_TABLET | Freq: Every day | ORAL | Status: DC

## 2012-11-18 MED ORDER — ATORVASTATIN CALCIUM 20 MG PO TABS
20.0000 mg | ORAL_TABLET | Freq: Every day | ORAL | Status: DC
  Administered 2012-11-18: 20 mg via ORAL
  Filled 2012-11-18 (×2): qty 1

## 2012-11-18 MED ORDER — LIDOCAINE HCL (PF) 1 % IJ SOLN
INTRAMUSCULAR | Status: AC
  Filled 2012-11-18: qty 30

## 2012-11-18 MED ORDER — SODIUM CHLORIDE 0.9 % IV SOLN
INTRAVENOUS | Status: DC
  Administered 2012-11-18: 50 mL/h via INTRAVENOUS

## 2012-11-18 MED ORDER — INSULIN GLARGINE 100 UNIT/ML ~~LOC~~ SOLN: 40.0000 [IU] | Freq: Every day | SUBCUTANEOUS | Status: DC

## 2012-11-18 MED ORDER — SPIRONOLACTONE 25 MG PO TABS
25.0000 mg | ORAL_TABLET | Freq: Every day | ORAL | Status: DC
  Administered 2012-11-19: 25 mg via ORAL
  Filled 2012-11-18: qty 1

## 2012-11-18 MED ORDER — CARVEDILOL 6.25 MG PO TABS
6.2500 mg | ORAL_TABLET | Freq: Two times a day (BID) | ORAL | Status: DC
  Administered 2012-11-18 – 2012-11-19 (×2): 6.25 mg via ORAL
  Filled 2012-11-18 (×4): qty 1

## 2012-11-18 MED ORDER — ONDANSETRON HCL 4 MG PO TABS
4.0000 mg | ORAL_TABLET | Freq: Four times a day (QID) | ORAL | Status: DC
  Administered 2012-11-18 – 2012-11-19 (×3): 4 mg via ORAL
  Filled 2012-11-18 (×7): qty 1

## 2012-11-18 MED ORDER — CHLORHEXIDINE GLUCONATE 4 % EX LIQD
60.0000 mL | Freq: Once | CUTANEOUS | Status: AC
  Administered 2012-11-18: 4 via TOPICAL
  Filled 2012-11-18: qty 60

## 2012-11-18 MED ORDER — INSULIN GLARGINE 100 UNIT/ML ~~LOC~~ SOLN
20.0000 [IU] | Freq: Every day | SUBCUTANEOUS | Status: DC
  Filled 2012-11-18: qty 0.2

## 2012-11-18 MED ORDER — FENTANYL CITRATE 0.05 MG/ML IJ SOLN
INTRAMUSCULAR | Status: AC
  Filled 2012-11-18: qty 2

## 2012-11-18 MED ORDER — FUROSEMIDE 40 MG PO TABS
40.0000 mg | ORAL_TABLET | Freq: Every day | ORAL | Status: DC
  Administered 2012-11-19: 40 mg via ORAL
  Filled 2012-11-18: qty 1

## 2012-11-18 MED ORDER — ACETAMINOPHEN 325 MG PO TABS: 325.0000 mg | ORAL_TABLET | ORAL | Status: DC | PRN

## 2012-11-18 MED ORDER — POTASSIUM CHLORIDE ER 10 MEQ PO TBCR
20.0000 meq | EXTENDED_RELEASE_TABLET | Freq: Every day | ORAL | Status: DC
  Administered 2012-11-19: 20 meq via ORAL
  Filled 2012-11-18: qty 2

## 2012-11-18 MED ORDER — MIDAZOLAM HCL 5 MG/5ML IJ SOLN
INTRAMUSCULAR | Status: AC
  Filled 2012-11-18: qty 5

## 2012-11-18 MED ORDER — CEFAZOLIN SODIUM 1-5 GM-% IV SOLN
1.0000 g | Freq: Four times a day (QID) | INTRAVENOUS | Status: AC
  Administered 2012-11-18 – 2012-11-19 (×3): 1 g via INTRAVENOUS
  Filled 2012-11-18 (×4): qty 50

## 2012-11-18 MED ORDER — HYDROCODONE-ACETAMINOPHEN 10-325 MG PO TABS
1.0000 | ORAL_TABLET | ORAL | Status: DC | PRN
  Administered 2012-11-18: 1 via ORAL
  Filled 2012-11-18: qty 2

## 2012-11-18 MED ORDER — FUROSEMIDE 20 MG PO TABS
20.0000 mg | ORAL_TABLET | Freq: Every evening | ORAL | Status: DC
  Administered 2012-11-18: 20 mg via ORAL
  Filled 2012-11-18 (×2): qty 1

## 2012-11-18 MED ORDER — TEMAZEPAM 7.5 MG PO CAPS: 7.5000 mg | ORAL_CAPSULE | Freq: Once | ORAL | Status: DC

## 2012-11-18 MED ORDER — ISOSORBIDE MONONITRATE ER 30 MG PO TB24
30.0000 mg | ORAL_TABLET | Freq: Every day | ORAL | Status: DC
  Administered 2012-11-19: 30 mg via ORAL
  Filled 2012-11-18: qty 1

## 2012-11-18 MED ORDER — ASPIRIN EC 81 MG PO TBEC
81.0000 mg | DELAYED_RELEASE_TABLET | Freq: Every day | ORAL | Status: DC
  Administered 2012-11-18 – 2012-11-19 (×2): 81 mg via ORAL
  Filled 2012-11-18 (×2): qty 1

## 2012-11-18 MED ORDER — INSULIN GLARGINE 100 UNIT/ML ~~LOC~~ SOLN
40.0000 [IU] | Freq: Every day | SUBCUTANEOUS | Status: DC
  Administered 2012-11-18: 40 [IU] via SUBCUTANEOUS
  Filled 2012-11-18 (×2): qty 0.4

## 2012-11-18 MED ORDER — HYDRALAZINE HCL 25 MG PO TABS
37.5000 mg | ORAL_TABLET | Freq: Three times a day (TID) | ORAL | Status: DC
  Administered 2012-11-18 – 2012-11-19 (×3): 37.5 mg via ORAL
  Filled 2012-11-18 (×5): qty 1.5

## 2012-11-18 MED ORDER — ENALAPRIL MALEATE 10 MG PO TABS
10.0000 mg | ORAL_TABLET | Freq: Two times a day (BID) | ORAL | Status: DC
  Administered 2012-11-18 – 2012-11-19 (×3): 10 mg via ORAL
  Filled 2012-11-18 (×5): qty 1

## 2012-11-18 MED ORDER — GLIMEPIRIDE 2 MG PO TABS
2.0000 mg | ORAL_TABLET | Freq: Every day | ORAL | Status: DC
  Administered 2012-11-19: 2 mg via ORAL
  Filled 2012-11-18 (×2): qty 1

## 2012-11-18 MED ORDER — INSULIN GLARGINE 100 UNIT/ML ~~LOC~~ SOLN
10.0000 [IU] | Freq: Every day | SUBCUTANEOUS | Status: DC
  Administered 2012-11-19: 10 [IU] via SUBCUTANEOUS
  Filled 2012-11-18: qty 0.1

## 2012-11-18 NOTE — Progress Notes (Signed)
Orthopedic Tech Progress Note Patient Details:  [name] 08-31-1950 [id number] Applied arm sling to LUE. Ortho Devices Type of Ortho Device: Arm sling Ortho Device/Splint Location: LUE Ortho Device/Splint Interventions: Application   Lesle Chris 11/18/2012, 2:22 PM

## 2012-11-18 NOTE — Interval H&P Note (Signed)
History and Physical Interval Note: Patient seen and examined. Agree with above. Patient has a life expectancy over a year.ICD Criteria  Current LVEF (within 6 months):25%  NYHA Functional Classification: Class III  Heart Failure History:  Yes, Duration of heart failure since onset is > 9 months  Non-Ischemic Dilated Cardiomyopathy History:  Yes, timeframe is > 9 months  Atrial Fibrillation/Atrial Flutter:  No.  Ventricular Tachycardia History:  No.  Cardiac Arrest History:  No  History of Syndromes with Risk of Sudden Death:  No.  Previous ICD:  No.  Electrophysiology Study: No.  Anticoagulation Therapy:  Patient is NOT on anticoagulation therapy.   Beta Blocker Therapy:  Yes.   Ace Inhibitor/ARB Therapy:  Yes.  11/18/2012 10:06 AM  [name]  has presented today for surgery, with the diagnosis of Heart failure  The various methods of treatment have been discussed with the patient and family. After consideration of risks, benefits and other options for treatment, the patient has consented to  Procedure(s): BI-VENTRICULAR IMPLANTABLE CARDIOVERTER DEFIBRILLATOR  (CRT-D) (N/A) as a surgical intervention .  The patient's history has been reviewed, patient examined, no change in status, stable for surgery.  I have reviewed the patient's chart and labs.  Questions were answered to the patient's satisfaction.     [name]

## 2012-11-18 NOTE — CV Procedure (Signed)
BiV ICD implant via the left subclavian vein without immediate complication. W#[id number].

## 2012-11-18 NOTE — H&P (View-Only) (Signed)
HPI [name] is referred today by Dr. Jearld Pies for consideration4 insertion of a biventricular ICD. She has a long-standing history of congestive heart failure, class III, despite maximal medical therapy. She has left bundle branch block. She has never had syncope. She can walk slowly on flat ground, but cannot walk fast, and cannot climb any incline. She has never had syncope. She denies chest pain. She has intermittent peripheral edema. She denies dietary or medical compliance problems.  There is no family history of sudden death. Allergies  Allergen Reactions  . Morphine And Related Nausea And Vomiting    Severe nausea     Current Outpatient Prescriptions  Medication Sig Dispense Refill  . aspirin EC 81 MG EC tablet Take 1 tablet (81 mg total) by mouth daily.      Marland Kitchen atorvastatin (LIPITOR) 20 MG tablet Take 1 tablet (20 mg total) by mouth daily.  30 tablet  3  . carvedilol (COREG) 6.25 MG tablet Take 1 tablet (6.25 mg total) by mouth 2 (two) times daily with a meal.  60 tablet  3  . enalapril (VASOTEC) 10 MG tablet Take 1 tablet (10 mg total) by mouth 2 (two) times daily.  60 tablet  3  . furosemide (LASIX) 40 MG tablet 1 tablet (40mg ) in the AM and 1/2 tablet (total 20mg ) in the PM      . glimepiride (AMARYL) 2 MG tablet Take 2 mg by mouth daily before breakfast.      . hydrALAZINE (APRESOLINE) 25 MG tablet Take 1.5 tablets (37.5 mg total) by mouth 3 (three) times daily.  140 tablet  3  . HYDROcodone-acetaminophen (NORCO/VICODIN) 5-325 MG per tablet Take 1-2 tablets by mouth every 6 (six) hours as needed for pain.  17 tablet  0  . insulin glargine (LANTUS) 100 UNIT/ML injection Inject 10-40 Units into the skin 2 (two) times daily. Pt takes 10 units in the morning and 40 at night      . isosorbide mononitrate (IMDUR) 30 MG 24 hr tablet Take 1 tablet (30 mg total) by mouth daily.  30 tablet  3  . metFORMIN (GLUCOPHAGE) 1000 MG tablet Take 1 tablet (1,000 mg total) by mouth every evening.       Marland Kitchen oxyCODONE-acetaminophen (PERCOCET/ROXICET) 5-325 MG per tablet Take 1 tablet by mouth every 4 (four) hours as needed for pain.  20 tablet  0  . potassium chloride (K-DUR) 10 MEQ tablet Take 2 tablets (20 mEq total) by mouth daily.  30 tablet  3  . spironolactone (ALDACTONE) 25 MG tablet Take 25 mg by mouth daily.       No current facility-administered medications for this visit.     Past Medical History  Diagnosis Date  . Diabetes mellitus   . Hypertension   . CAD (coronary artery disease), native coronary artery     a. Nonobstructive by cath 02/2012 (done because of low EF).  . Tobacco abuse   . Chronic combined systolic and diastolic CHF (congestive heart failure)     a. 03/05/12 echo:  LVEF 20-25%, moderate LVH , inferior and basal to mid septal akinesis, anterior moderate to severe hypokinesis and grade 2 diastolic dysfunction. b. EF 07/2012: EF still 25% (unclear medication compliance).  . Anemia     a. Noted on 07/2012 labs, instructed to f/u PCP.  Marland Kitchen LBBB (left bundle branch block)   . History of noncompliance with medical treatment     ROS:   All systems reviewed and negative except as  noted in the HPI.   Past Surgical History  Procedure Laterality Date  . Joint replacement      Bilateral hip and right knee  . Cardiac catheterization  03/04/12    nonobstructive CAD, elevated LVEDP and tortuous vessels suggestive of long-standing hypertension     Family History  Problem Relation Age of Onset  . Other      Denies family history of CAD     History   Social History  . Marital Status: Single    Spouse Name: N/A    Number of Children: N/A  . Years of Education: N/A   Occupational History  . Not on file.   Social History Main Topics  . Smoking status: Former Smoker -- 1.00 packs/day for 40 years    Types: Cigarettes    Start date: 06/23/1972    Quit date: 03/26/2012  . Smokeless tobacco: Never Used  . Alcohol Use: No  . Drug Use: No  . Sexual Activity: Not  on file   Other Topics Concern  . Not on file   Social History Narrative  . No narrative on file     BP 131/78  Pulse 105  Ht 5\' 7"  (1.702 m)  Wt 220 lb (99.791 kg)  BMI 34.45 kg/m2  Physical Exam:  Well appearing 62 year old woman, NAD HEENT: Unremarkable Neck:  7 cm JVD, no thyromegally Back:  No CVA tenderness Lungs:  Clear with no wheezes, rales, or rhonchi. HEART:  Regular rate rhythm, no murmurs, no rubs, no clicks Abd:  soft, positive bowel sounds, no organomegally, no rebound, no guarding Ext:  2 plus pulses, no edema, no cyanosis, no clubbing Skin:  No rashes no nodules Neuro:  CN II through XII intact, motor grossly intact  EKG - sinus tachycardia with left bundle branch block, QRS duration 158 ms.   Assess/Plan:

## 2012-11-19 ENCOUNTER — Ambulatory Visit (HOSPITAL_COMMUNITY): Payer: PRIVATE HEALTH INSURANCE

## 2012-11-19 DIAGNOSIS — I5043 Acute on chronic combined systolic (congestive) and diastolic (congestive) heart failure: Secondary | ICD-10-CM

## 2012-11-19 LAB — GLUCOSE, CAPILLARY
Glucose-Capillary: 121 mg/dL — ABNORMAL HIGH (ref 70–99)
Glucose-Capillary: 70 mg/dL (ref 70–99)

## 2012-11-19 MED ORDER — METFORMIN HCL 1000 MG PO TABS: 1000.0000 mg | ORAL_TABLET | Freq: Two times a day (BID) | ORAL | Status: DC

## 2012-11-19 NOTE — Progress Notes (Signed)
Nutrition Brief Note  Patient identified on the Malnutrition Screening Tool (MST) Report for recent weight lost without trying.  Per readings below, patient's weight fluctuates more than likely given fluid and medical hx.  Wt Readings from Last 15 Encounters:  11/19/12 212 lb 1.3 oz (96.2 kg)  11/19/12 212 lb 1.3 oz (96.2 kg)  11/12/12 222 lb (100.699 kg)  10/19/12 220 lb (99.791 kg)  10/14/12 215 lb (97.523 kg)  09/29/12 217 lb (98.431 kg)  08/17/12 213 lb 3.2 oz (96.707 kg)  08/12/12 217 lb 6 oz (98.6 kg)  06/23/12 195 lb (88.451 kg)  03/06/12 209 lb 7 oz (95 kg)  03/06/12 209 lb 7 oz (95 kg)  03/06/12 209 lb 7 oz (95 kg)  01/28/12 195 lb (88.451 kg)    Body mass index is 41.42 kg/(m^2). Patient meets criteria for Obesity Class III based on current BMI.   Current diet order is Heart Healthy, patient is consuming approximately 100% of meals at this time. Labs and medications reviewed.   No nutrition interventions warranted at this time. If nutrition issues arise, please consult RD.   Maureen Chatters, RD, LDN Pager #: [phone] After-Hours Pager #: [phone]

## 2012-11-19 NOTE — Progress Notes (Signed)
Assessment unchanged. Tele D/C and IV. RN reviewed D/C instructions with pt. Pt verbalized understanding. Pt left via wheelchair accompanied by volunteer.   Genevive Bi, RN

## 2012-11-19 NOTE — Op Note (Signed)
NAMEJONAE, RENSHAW                ACCOUNT NO.:  [id number]  MEDICAL RECORD NO.:  [id number]  LOCATION:  4E06C                        FACILITY:  [facility]  PHYSICIAN:  Doylene Canning. Ladona Ridgel, MD    DATE OF BIRTH:  January 30, 1950  DATE OF PROCEDURE:  11/18/2012 DATE OF DISCHARGE:                              OPERATIVE REPORT   PROCEDURE PERFORMED:  Implantation of a biventricular ICD.  INDICATION:  Chronic systolic heart failure of greater than 9 months duration, left bundle-branch block, with ejection fraction of 25% despite maximum medical therapy.  INTRODUCTION:  The patient is a very pleasant 62 year old woman with a nonischemic cardiomyopathy and chronic systolic heart failure, class III, with left bundle branch block, with persistent left ventricular dysfunction, ejection fraction 25% by echo.  She is on maximum medications.  The patient is now referred for insertion of a biventricular ICD secondary to all of the above in the setting of a QRS duration of 180 milliseconds and left bundle-branch block.  PROCEDURE:  After informed consent was obtained, the patient was taken to the Diagnostic Electrophysiology Laboratory in the fasting state. After usual preparation and draping, intravenous fentanyl and midazolam were given for sedation.  A 30 mL of lidocaine was infiltrated into the left infraclavicular region.  A 7-cm incision was carried out over this region and electrocautery was utilized to dissect down to the fascial plane.  The left subclavian vein was then punctured x3.  The Medtronic model I7797228, 58-cm active fixation defibrillation lead, serial number [id number] was advanced into the right ventricle, and the Medtronic model 5076, 45-cm active fixation pacing lead, serial number [id number] was advanced to the right atrium.  Mapping was carried out in the right ventricle.  At the final site, the R-waves measured 10 millivolts, the pacing impedance with lead actively fixed was 600 ohms  and threshold of 0.5 volts at 0.5 milliseconds.  The 10-volt pacing did not stimulate the diaphragm.  There was a large injury current.  With the right ventricular lead in satisfactory position, attention was then turned to the pacing lead, which was placed in the anterolateral portion of the right atrium.  In this location, the P-waves measured 2.5 millivolts and with the lead actively fixed, the pacing impedance was 500 ohms.  The threshold was 0.4 volts at 0.5 milliseconds and again, 10-volt pacing did not stimulate the diaphragm.  With both the atrial and defibrillator leads in satisfactory position, attention was then turned to placement of the left ventricular lead.  The 9-French sheath followed by the coronary sinus guiding catheter was advanced into the right atrium followed by the 6-French hexapolar EP catheter, which was utilized to cannulate the coronary sinus.  The right atrium was quite large, making cannulation in the coronary sinus more difficult.  After accomplishing this, the guiding catheter was advanced into the coronary sinus and venography of the coronary sinus was carried out.  This demonstrated a very early takeoff posterior vein as well as a lateral vein, which was utilized for LV lead placement.  The angioplasty guidewire was advanced into the lateral vein and the left ventricle and the Medtronic model 4298, 88-cm quadripolar left ventricular pacing lead was advanced  over the guidewire into the left ventricle.  We initially had some difficulty with lead stability, but after several attempts at the final site, there was very stable lead positioning.  The 10-volt pacing in the left ventricle did not stimulate the diaphragm.  With these satisfactory parameters and with a threshold less than a volt at 0.5 milliseconds, the guiding catheter was liberated from the lead in the usual manner. The leads were then secured to the subpectoral fascia with a figure-of- eight  silk suture and the sewing sleeve was secured with silk suture. Electrocautery was utilized to make a subcutaneous pocket.  Antibiotic irrigation was utilized to irrigate the pocket and electrocautery was utilized to assure hemostasis.  The Medtronic Viva Quad XT CRT-D biventricular ICD, serial number [id number] was connected to the atrial defibrillator and the LV lead and placed back in the subcutaneous pocket.  The pocket was irrigated with antibiotic irrigation and the incision was closed with 2-0 and 3-0 Vicryl.  Benzoin and Steri-Strips were painted on the skin and a pressure dressing was applied.  At this point, I scrubbed out of the case to supervise defibrillation threshold testing.  After the patient more deeply sedated under my direct supervision, ventricular fibrillation was induced with a T-wave shock, but would terminate spontaneously.  Ventricular fibrillation was then initiated with the VFib mode.  After this was accomplished, the device recognized ventricular fibrillation and successfully restored sinus rhythm with a 20-joule shock.  No additional defibrillation threshold testing was carried out and the patient was returned to the recovery area in satisfactory condition.  COMPLICATIONS:  There were no immediate procedure complications.  RESULTS:  This demonstrate successful implantation of a Medtronic biventricular ICD in a patient with a nonischemic cardiomyopathy, chronic class III systolic heart failure, ejection fraction 25% with left bundle-branch block and a QRS duration of 180 milliseconds despite maximal medical therapy with beta-blockers and ACE inhibitors.     Doylene Canning. Ladona Ridgel, MD     GWT/MEDQ  D:  11/18/2012  T:  11/19/2012  Job:  [id number]  cc:   Marca Ancona, MD

## 2012-11-19 NOTE — Discharge Summary (Signed)
ELECTROPHYSIOLOGY PROCEDURE DISCHARGE SUMMARY    Patient ID: [name],  MRN: [id number], DOB/AGE: Nov 12, 1950 62 y.o.  Admit date: 11/18/2012 Discharge date: 11/19/2012  Primary Care Physician: Dr Mikeal Hawthorne Primary Cardiologist: Marca Ancona Electrophysiologist: Lewayne Bunting  Primary Discharge Diagnosis:  Non ischemic cardiomyopathy, congestive heart failure and LBBB s/p CRTD implant this admission  Secondary Discharge Diagnosis:  1.  Type II diabetes 2.  Hypertension 3.  Non obstructive coronary disease by cath 02-2012  Procedures This Admission: 1.  Implantation of a cardiac resynchronization therapy defibrillator on 11-18-2012 by Dr Ladona Ridgel.  The patient received a MDT Carmelia Roller CRTD with model number 5076 right atrial lead, 6935 right ventricular lead, and 4298 left ventricular lead.  DFT's were successful at 20J.  There were no early apparent complications.  2.  CXR on 11-19-2012 demonstrated no PTX.  Brief HPI: [name] is a 62 year old female with a history of non ischemic cardiomyopathy, congestive heart failure and LBBB who was referred to EP in the outpatient setting for consideration of CRTD implant.  Her cardiomyopathy has persisted despite maximal medical therapy.  Risks, benefits, and alternatives were reviewed with the patient who wished to proceed.    Hospital Course:  The patient was admitted and underwent implantation of a Medtronic CRTD with details as outlined above.   She was monitored on telemetry overnight which demonstrated sinus rhythm with ventricular pacing.  Left chest was without hematoma or ecchymosis.  The device was interrogated and found to be functioning normally.  CXR was obtained and demonstrated no pneumothorax status post device implantation.  Wound care, arm mobility, and restrictions were reviewed with the patient.  Dr Ladona Ridgel examined the patient and considered them stable for discharge to home.    Discharge Vitals: Blood pressure  121/73, pulse 87, temperature 98 F (36.7 C), temperature source Oral, resp. rate 18, height 5' (1.524 m), weight 212 lb 1.3 oz (96.2 kg), SpO2 99.00%.    Labs:   Lab Results  Component Value Date   WBC 18.5* 11/15/2012   HGB 11.8* 11/15/2012   HCT 32.7* 11/15/2012   MCV 87.7 11/15/2012   PLT 384 11/15/2012    Recent Labs Lab 11/15/12 1930  NA 142  K 4.4  CL 103  CO2 28  BUN 26*  CREATININE 1.04  CALCIUM 9.7  PROT 7.6  BILITOT 0.3  ALKPHOS 63  ALT 8  AST 12  GLUCOSE 144*     Discharge Medications:    Medication List    ASK your doctor about these medications       aspirin EC 81 MG tablet  Take 81 mg by mouth daily.     atorvastatin 20 MG tablet  Commonly known as:  LIPITOR  Take 20 mg by mouth daily.     carvedilol 6.25 MG tablet  Commonly known as:  COREG  Take 6.25 mg by mouth 2 (two) times daily with a meal.     enalapril 10 MG tablet  Commonly known as:  VASOTEC  Take 10 mg by mouth 2 (two) times daily.     furosemide 40 MG tablet  Commonly known as:  LASIX  Take 20-40 mg by mouth daily. 1 tablet in the morning and 0.5 tablet in the evening.     glimepiride 2 MG tablet  Commonly known as:  AMARYL  Take 2 mg by mouth daily before breakfast.     hydrALAZINE 25 MG tablet  Commonly known as:  APRESOLINE  Take 37.5 mg by mouth 3 (three) times daily.     HYDROcodone-acetaminophen 10-325 MG per tablet  Commonly known as:  NORCO  Take 1-2 tablets by mouth daily as needed for pain.     insulin glargine 100 UNIT/ML injection  Commonly known as:  LANTUS  Inject 10-40 Units into the skin 2 (two) times daily. Pt takes 10 units in the morning and 40 at night     isosorbide mononitrate 30 MG 24 hr tablet  Commonly known as:  IMDUR  Take 30 mg by mouth daily.     metFORMIN 1000 MG tablet  Commonly known as:  GLUCOPHAGE  Take 1,000 mg by mouth 2 (two) times daily with a meal.     ondansetron 4 MG tablet  Commonly known as:  ZOFRAN  Take 1 tablet  (4 mg total) by mouth every 6 (six) hours.     potassium chloride 10 MEQ tablet  Commonly known as:  K-DUR  Take 20 mEq by mouth daily.     spironolactone 25 MG tablet  Commonly known as:  ALDACTONE  Take 25 mg by mouth daily.        Disposition:       Future Appointments Provider Department Dept Phone   11/19/2012 6:50 AM [facility] DI[facility]ST[facility] RADIOLOG[facility] [phone]       Duration of Discharge Encounter: Greater than 30 minutes including physician time.  Signed,  Leonia Reeves.D.

## 2012-11-30 ENCOUNTER — Ambulatory Visit (INDEPENDENT_AMBULATORY_CARE_PROVIDER_SITE_OTHER): Payer: PRIVATE HEALTH INSURANCE | Admitting: *Deleted

## 2012-11-30 DIAGNOSIS — I509 Heart failure, unspecified: Secondary | ICD-10-CM

## 2012-11-30 DIAGNOSIS — I5022 Chronic systolic (congestive) heart failure: Secondary | ICD-10-CM

## 2012-11-30 DIAGNOSIS — I428 Other cardiomyopathies: Secondary | ICD-10-CM

## 2012-11-30 LAB — ICD DEVICE OBSERVATION
AL AMPLITUDE: 3.8 mv
AL IMPEDENCE ICD: 361 Ohm
AL THRESHOLD: 0.5 V
DEV-0020ICD: NEGATIVE
HV IMPEDENCE: 55 Ohm
LV LEAD IMPEDENCE ICD: 532 Ohm
LV LEAD THRESHOLD: 0.5 V
RV LEAD AMPLITUDE: 12.1 mv
RV LEAD IMPEDENCE ICD: 532 Ohm
RV LEAD THRESHOLD: 0.5 V
VENTRICULAR PACING ICD: 55 pct

## 2012-11-30 NOTE — Progress Notes (Signed)

## 2012-12-08 ENCOUNTER — Encounter: Payer: Self-pay | Admitting: Internal Medicine

## 2012-12-15 ENCOUNTER — Ambulatory Visit (HOSPITAL_COMMUNITY)
Admission: RE | Admit: 2012-12-15 | Discharge: 2012-12-15 | Disposition: A | Discharge status: Home / Self Care | Payer: PRIVATE HEALTH INSURANCE | Source: Ambulatory Visit | Attending: Internal Medicine | Admitting: Internal Medicine

## 2012-12-15 VITALS — BP 124/76 | HR 103 | Wt 219.5 lb

## 2012-12-15 DIAGNOSIS — I428 Other cardiomyopathies: Secondary | ICD-10-CM | POA: Insufficient documentation

## 2012-12-15 DIAGNOSIS — I10 Essential (primary) hypertension: Secondary | ICD-10-CM

## 2012-12-15 DIAGNOSIS — I509 Heart failure, unspecified: Secondary | ICD-10-CM

## 2012-12-15 DIAGNOSIS — I5022 Chronic systolic (congestive) heart failure: Secondary | ICD-10-CM

## 2012-12-15 DIAGNOSIS — E785 Hyperlipidemia, unspecified: Secondary | ICD-10-CM | POA: Insufficient documentation

## 2012-12-15 MED ORDER — CARVEDILOL 6.25 MG PO TABS: 9.3750 mg | ORAL_TABLET | Freq: Two times a day (BID) | ORAL | Status: DC

## 2012-12-15 MED ORDER — FUROSEMIDE 40 MG PO TABS: 40.0000 mg | ORAL_TABLET | Freq: Two times a day (BID) | ORAL | Status: DC

## 2012-12-15 NOTE — Patient Instructions (Addendum)
Increase your coreg to 9.375 mg ( 1 1/2 tablets) twice a day.  Increase your lasix to 40 mg twice a day.  Call any issues [phone].  Go get labs in 7-10 days.  F/U 3 months with ECHO.   Do the following things EVERYDAY: 1) Weigh yourself in the morning before breakfast. Write it down and keep it in a log. 2) Take your medicines as prescribed 3) Eat low salt foods-Limit salt (sodium) to 2000 mg per day.  4) Stay as active as you can everyday 5) Limit all fluids for the day to less than 2 liters 6)

## 2012-12-15 NOTE — Progress Notes (Signed)
Patient ID: [name], female   DOB: Jun 09, 1950, 62 y.o.   MRN: [id number] PCP: Dr. Mikeal Hawthorne  62 yo with history of nonischemic cardiomyopathy presents for cardiology followup.  She was admitted with CHF exacerbation in 02/2012.  EF 20-25% on echo, LHC with nonobstructive CAD.  She was started on cardiac meds and discharged.  In 07/2012, she was admitted again with CHF exacerbation.  She had run out of Lasix.  She was taking her other heart medications as ordered, however.  She was diuresed and discharged.  Repeat echo in 07/2012 showed that EF remained 25%.  She has a chronic LBBB.    Follow up: Since last vistt had CRT-D placed. She is now back on her coreg 6.25 mg BID and IMDUR 30 mg daily. Did not increase lasix to 40 mg BID. Feels better since CRT-D. Denies orthopnea, CP, or edema. + DOE with going up stairs. Can't walk around the whole grocery store without SOB, can walk about 200 yds. Taking medications as prescribed. Following a low salt diet and drinking less than 2L a day. Weight at home 198-202 lbs (scale off).  Down 3 lbs on our scales.   Labs (2/14): SPEP negative, UPEP negative, HIV negative Labs (7/14): K 4, creatinine 1.0, LDL 66 Labs (08/25/12): K 4, creatinine 1.0 Labs (10/06/12) : K 3.3 creatinine 0.7 pro-bnp 188 Labs (11/15/12): K 4.4, creatinine 1.04, pro-BNP 468  PMH: 1. HTN 2. Type II diabetes 3. Nonischemic cardiomyopathy: ? Due to HTN versus LBBB CMP.  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  Echo (2/14) with EF 20-25%.  Echo (7/14) with EF 25%, diffuse hypokinesis.  HIV, SPEP, UPEP negative.  Has LBBB. CRT-D 10/2012 (Medtronic).  4. Chronic LBBB 5. Right TKR 6. Bilateral THR.  7. Hyperlipidemia  SH: Prior smoker, quit 2/14.  Never drank ETOH.  No drugs. Lives with son.   FH: Mother with "heart trouble."   ROS: All systems reviewed and negative except as per HPI.   Current Outpatient Prescriptions  Medication Sig Dispense Refill  . aspirin EC 81 MG tablet Take 81 mg by  mouth daily.      Marland Kitchen atorvastatin (LIPITOR) 20 MG tablet Take 20 mg by mouth daily.      . carvedilol (COREG) 6.25 MG tablet Take 6.25 mg by mouth 2 (two) times daily with a meal.      . enalapril (VASOTEC) 10 MG tablet Take 10 mg by mouth 2 (two) times daily.      . furosemide (LASIX) 40 MG tablet 1 tablet in the morning and 0.5 tablet in the evening.      . hydrALAZINE (APRESOLINE) 25 MG tablet Take 37.5 mg by mouth 3 (three) times daily.      Marland Kitchen HYDROcodone-acetaminophen (NORCO) 10-325 MG per tablet Take 1-2 tablets by mouth daily as needed for pain.      Marland Kitchen insulin glargine (LANTUS) 100 UNIT/ML injection Inject 40 Units into the skin at bedtime.       . isosorbide mononitrate (IMDUR) 30 MG 24 hr tablet Take 30 mg by mouth daily.      . metFORMIN (GLUCOPHAGE) 1000 MG tablet Take 1 tablet (1,000 mg total) by mouth 2 (two) times daily with a meal. HOLD for 2 days then resume on 11/21/2012      . ondansetron (ZOFRAN) 4 MG tablet Take 1 tablet (4 mg total) by mouth every 6 (six) hours.  8 tablet  0  . potassium chloride (K-DUR) 10 MEQ tablet Take 20  mEq by mouth daily.      Marland Kitchen spironolactone (ALDACTONE) 25 MG tablet Take 25 mg by mouth daily.       No current facility-administered medications for this encounter.    Filed Vitals:   12/15/12 1358  BP: 124/76  Pulse: 103  Weight: 219 lb 8 oz (99.565 kg)  SpO2: 98%   General: NAD Neck: JVP 8-9, no thyromegaly or thyroid nodule.  Lungs: Clear to auscultation bilaterally with normal respiratory effort. CV: Nondisplaced PMI.  Heart regular S1/S2, no S3/S4, no murmur. Bilateral trace edema.  No carotid bruit.  Normal pedal pulses.  Abdomen: Soft, nontender, no hepatosplenomegaly, no distention.  Skin: Intact without lesions or rashes.  Neurologic: Alert and oriented x 3.   Psych: Normal affect. Extremities: No clubbing or cyanosis.    Assessment/Plan: 1. Chronic systolic CHF: Nonischemic cardiomyopathy. S/P CRT-D (Medtronic). Patient has NYHA  class III symptoms, however reports feeling better since CRT-D. She remains mildly volume overloaded on exam.  - Will increase lasix to 40 mg BID. She will get BMET and pro-BNP in 7-10 days. - Will increase coreg to 9.375 mg BID. - Continue hydralazine to 37.5 mg tid and Imdur 30 mg daily.  - Continue enalapril to 10 mg bid and spironolactone 25 mg daily. - Reinforced the need and importance of daily weights, a low sodium diet, and fluid restriction (less than 2 L a day). Instructed to call the HF clinic if weight increases more than 3 lbs overnight or 5 lbs in a week.  2. Hyperlipidemia: Recently restarted back on atorvastatin. Last lipid profile 7/14 which was good. Will continue current dose.  3. HTN: BP controlled.   Follow up 3 months with ECHO.   Ulla Potash B NP-C 12/15/2012 2:08 PM  Patient seen with NP, agree with note.  Some symptomatic improvement with CRT-D.  Remains mildly volume overloaded so will increase Lasix to 40 mg bid.   She is stable enough that I think we can go ahead and increase Coreg to 9.375 mg bid.  Follow BMET 10 days.  Followup office 3 months with echo.   Marca Ancona 12/16/2012

## 2012-12-24 ENCOUNTER — Other Ambulatory Visit: Payer: PRIVATE HEALTH INSURANCE

## 2013-01-02 ENCOUNTER — Other Ambulatory Visit (HOSPITAL_COMMUNITY): Payer: Self-pay | Admitting: Adult Health

## 2013-02-09 ENCOUNTER — Other Ambulatory Visit: Payer: Self-pay | Admitting: *Deleted

## 2013-02-18 ENCOUNTER — Encounter: Payer: PRIVATE HEALTH INSURANCE | Admitting: Internal Medicine

## 2013-03-02 ENCOUNTER — Encounter: Payer: Self-pay | Admitting: Internal Medicine

## 2013-03-14 ENCOUNTER — Other Ambulatory Visit: Payer: Self-pay | Admitting: Cardiology

## 2013-03-22 ENCOUNTER — Encounter: Payer: PRIVATE HEALTH INSURANCE | Admitting: Internal Medicine

## 2013-03-28 ENCOUNTER — Encounter: Payer: Self-pay | Admitting: Internal Medicine

## 2013-03-28 ENCOUNTER — Ambulatory Visit (INDEPENDENT_AMBULATORY_CARE_PROVIDER_SITE_OTHER): Payer: PRIVATE HEALTH INSURANCE | Admitting: Internal Medicine

## 2013-03-28 VITALS — BP 142/80 | HR 96 | Ht 60.0 in | Wt 238.2 lb

## 2013-03-28 DIAGNOSIS — I509 Heart failure, unspecified: Secondary | ICD-10-CM

## 2013-03-28 DIAGNOSIS — I428 Other cardiomyopathies: Secondary | ICD-10-CM

## 2013-03-28 DIAGNOSIS — I5043 Acute on chronic combined systolic (congestive) and diastolic (congestive) heart failure: Secondary | ICD-10-CM

## 2013-03-28 DIAGNOSIS — Z95810 Presence of automatic (implantable) cardiac defibrillator: Secondary | ICD-10-CM

## 2013-03-28 DIAGNOSIS — I10 Essential (primary) hypertension: Secondary | ICD-10-CM

## 2013-03-28 DIAGNOSIS — I5022 Chronic systolic (congestive) heart failure: Secondary | ICD-10-CM

## 2013-03-28 NOTE — Assessment & Plan Note (Signed)
Her CHF is class 2B. She is limited by her legs. She will continue her current meds. I considered adding additional lasix if her CHF symptoms worsen.

## 2013-03-28 NOTE — Patient Instructions (Addendum)
Your physician recommends that you continue on your current medications as directed. Please refer to the Current Medication list given to you today.  Remote monitoring is used to monitor your ICD from home. This monitoring reduces the number of office visits required to check your device to one time per year. It allows [place] to keep an eye on the functioning of your device to ensure it is working properly. You are scheduled for a device check from home on 06-29-2013. You may send your transmission at any time that day. If you have a wireless device, the transmission will be sent automatically. After your physician reviews your transmission, you will receive a postcard with your next transmission date.  Your physician recommends that you schedule a follow-up appointment in: 12 months with Dr.Taylor

## 2013-03-28 NOTE — Assessment & Plan Note (Signed)
Her Blood pressure is slightly elevated. Will continue to work on her salt intake. I recommended continuing her current meds. I considered uptitration of coreg but will hold for now.

## 2013-03-28 NOTE — Progress Notes (Signed)
HPI The patient is a very pleasant 63 yo woman with a h/o chronic systolic heart failure, LBBB, EF 25%, s/p BiV ICD implantation approx. 4 months ago. She has done well since her BiV ICD implant. She notes pain in her legs when she walks, and she tells me this is what limits her. She does get sob as well when going up an incline. No other complaints. She denies sodium indiscretion. Allergies  Allergen Reactions  . Morphine And Related Nausea And Vomiting    Severe nausea     Current Outpatient Prescriptions  Medication Sig Dispense Refill  . aspirin EC 81 MG tablet Take 81 mg by mouth daily.      Marland Kitchen atorvastatin (LIPITOR) 20 MG tablet Take 20 mg by mouth daily.      . carvedilol (COREG) 6.25 MG tablet Take 1.5 tablets (9.375 mg total) by mouth 2 (two) times daily with a meal.  90 tablet  6  . enalapril (VASOTEC) 10 MG tablet Take 10 mg by mouth 2 (two) times daily.      . furosemide (LASIX) 40 MG tablet Take 1 tablet (40 mg total) by mouth 2 (two) times daily.  60 tablet  6  . hydrALAZINE (APRESOLINE) 25 MG tablet Take 37.5 mg by mouth 3 (three) times daily.      Marland Kitchen HYDROcodone-acetaminophen (NORCO) 10-325 MG per tablet Take 1-2 tablets by mouth daily as needed for pain.      Marland Kitchen insulin glargine (LANTUS) 100 UNIT/ML injection Inject 40 Units into the skin at bedtime.       . isosorbide mononitrate (IMDUR) 30 MG 24 hr tablet Take 30 mg by mouth daily.      Marland Kitchen KLOR-CON 10 10 MEQ tablet TAKE 2 TABLETS BY MOUTH ONCE DAILY  30 tablet  6  . metFORMIN (GLUCOPHAGE) 1000 MG tablet Take 1 tablet (1,000 mg total) by mouth 2 (two) times daily with a meal. HOLD for 2 days then resume on 11/21/2012      . ondansetron (ZOFRAN) 4 MG tablet Take 1 tablet (4 mg total) by mouth every 6 (six) hours.  8 tablet  0  . spironolactone (ALDACTONE) 25 MG tablet Take 25 mg by mouth daily.       No current facility-administered medications for this visit.     Past Medical History  Diagnosis Date  .  Hypertension   . CAD (coronary artery disease), native coronary artery     a. Nonobstructive by cath 02/2012 (done because of low EF).  . Tobacco abuse   . Chronic combined systolic and diastolic CHF (congestive heart failure)     a. 03/05/12 echo:  LVEF 20-25%, moderate LVH , inferior and basal to mid septal akinesis, anterior moderate to severe hypokinesis and grade 2 diastolic dysfunction. b. EF 07/2012: EF still 25% (unclear medication compliance).  . Anemia     a. Noted on 07/2012 labs, instructed to f/u PCP.  Marland Kitchen LBBB (left bundle branch block)   . History of noncompliance with medical treatment   . High cholesterol   . Chronic bronchitis     "~ every other year" (11/18/2012)  . Orthopnea   . Type II diabetes mellitus   . Headache(784.0)     "often; maybe not daily" (11/18/2012)  . Arthritis     "joints" (11/18/2012)  . Chronic lower back pain   . Automatic implantable cardioverter-defibrillator in situ     ROS:   All systems reviewed and negative except  as noted in the HPI.   Past Surgical History  Procedure Laterality Date  . Joint replacement      Bilateral hip and right knee  . Cardiac catheterization  03/04/12    nonobstructive CAD, elevated LVEDP and tortuous vessels suggestive of long-standing hypertension  . Bi-ventricular implantable cardioverter defibrillator  (crt-d)  11/18/2012     Family History  Problem Relation Age of Onset  . Other      Denies family history of CAD     History   Social History  . Marital Status: Single    Spouse Name: N/A    Number of Children: N/A  . Years of Education: N/A   Occupational History  . Not on file.   Social History Main Topics  . Smoking status: Former Smoker -- 1.00 packs/day for 40 years    Types: Cigarettes    Start date: 06/23/1972    Quit date: 03/26/2012  . Smokeless tobacco: Never Used  . Alcohol Use: No  . Drug Use: No  . Sexual Activity: Yes   Other Topics Concern  . Not on file   Social History  Narrative  . No narrative on file     BP 142/80  Pulse 96  Ht 5' (1.524 m)  Wt 238 lb 3.2 oz (108.047 kg)  BMI 46.52 kg/m2  Physical Exam:  Well appearing middle age woman, overweight, NAD HEENT: Unremarkable Neck:  No JVD, no thyromegally Back:  No CVA tenderness Lungs:  Clear with no wheezes HEART:  Regular rate rhythm, no murmurs, no rubs, no clicks Abd:  soft, positive bowel sounds, no organomegally, no rebound, no guarding Ext:  2 plus pulses, no edema, no cyanosis, no clubbing Skin:  No rashes no nodules Neuro:  CN II through XII intact, motor grossly intact  EKG - nsr with BiV pacing  DEVICE  Normal device function.  See PaceArt for details.   Assess/Plan:

## 2013-04-07 ENCOUNTER — Encounter (HOSPITAL_COMMUNITY): Payer: Self-pay | Admitting: Cardiology

## 2013-04-25 ENCOUNTER — Telehealth: Payer: Self-pay | Admitting: Internal Medicine

## 2013-04-25 ENCOUNTER — Ambulatory Visit (HOSPITAL_COMMUNITY)
Admission: RE | Admit: 2013-04-25 | Discharge: 2013-04-25 | Disposition: A | Discharge status: Home / Self Care | Payer: PRIVATE HEALTH INSURANCE | Source: Ambulatory Visit | Attending: Internal Medicine | Admitting: Internal Medicine

## 2013-04-25 VITALS — BP 144/76 | HR 104 | Wt 231.5 lb

## 2013-04-25 DIAGNOSIS — I10 Essential (primary) hypertension: Secondary | ICD-10-CM

## 2013-04-25 DIAGNOSIS — I509 Heart failure, unspecified: Secondary | ICD-10-CM

## 2013-04-25 DIAGNOSIS — I5022 Chronic systolic (congestive) heart failure: Secondary | ICD-10-CM | POA: Insufficient documentation

## 2013-04-25 LAB — BASIC METABOLIC PANEL
BUN: 17 mg/dL (ref 6–23)
CO2: 27 mEq/L (ref 19–32)
Calcium: 9.7 mg/dL (ref 8.4–10.5)
Chloride: 99 mEq/L (ref 96–112)
Creatinine, Ser: 0.83 mg/dL (ref 0.50–1.10)
GFR calc Af Amer: 87 mL/min — ABNORMAL LOW (ref >90)
GFR calc non Af Amer: 75 mL/min — ABNORMAL LOW (ref >90)
Glucose, Bld: 330 mg/dL — ABNORMAL HIGH (ref 70–99)
Potassium: 4.3 mEq/L (ref 3.7–5.3)
Sodium: 140 mEq/L (ref 137–147)

## 2013-04-25 LAB — PRO B NATRIURETIC PEPTIDE: Pro B Natriuretic peptide (BNP): 88.9 pg/mL (ref 0–125)

## 2013-04-25 MED ORDER — HYDRALAZINE HCL 50 MG PO TABS: 50.0000 mg | ORAL_TABLET | Freq: Three times a day (TID) | ORAL | Status: DC

## 2013-04-25 MED ORDER — ISOSORBIDE MONONITRATE ER 60 MG PO TB24: 60.0000 mg | ORAL_TABLET | Freq: Every day | ORAL | Status: DC

## 2013-04-25 NOTE — Telephone Encounter (Signed)
New Message  [name] with [facility] called states that an order was sent for Oxygen for the pt // Pt refused the order// she is requesting portable oxygen, please place another order//SR

## 2013-04-25 NOTE — Patient Instructions (Signed)
Increase Hydralazine to 50 mg Three times a day   Increase Imdur to 60 mg daily  Labs today  Your physician has requested that you have an echocardiogram. Echocardiography is a painless test that uses sound waves to create images of your heart. It provides your doctor with information about the size and shape of your heart and how well your heart's chambers and valves are working. This procedure takes approximately one hour. There are no restrictions for this procedure.  We will contact you in 2 months to schedule your next appointment.

## 2013-04-25 NOTE — Progress Notes (Signed)
Patient ID: [name], female   DOB: 1950/01/29, 63 y.o.   MRN: [id number] PCP: Dr. Jonelle Sidle  63 yo with history of nonischemic cardiomyopathy presents for cardiology followup.  She was admitted with CHF exacerbation in 02/2012.  EF 20-25% on echo, LHC with nonobstructive CAD.  She was started on cardiac meds and discharged.  In 07/2012, she was admitted again with CHF exacerbation.  She had run out of Lasix.  She was taking her other heart medications as ordered, however.  She was diuresed and discharged.  Repeat echo in 07/2012 showed that EF remained 25%.  She had a chronic LBBB, and Medtronic CRT-D device was placed in 10/14.    Symptomatically, she has been stable since last visit.  She is short of breath walking up steps and is short of breath after walking < 1 block on flat ground.  This has been chronic for a long time.  No orthopnea or PND, +bendopnea.  Weight is up 12 lbs since last appointment.  She says she has not been active over the winter and has been eating more than normal and not as well.  She does not think that she is retaining fluid.  She is compliant with her medications.  She uses oxygen with exertion.   Optivol was assessed today: Fluid index is well below threshold and thoracic impedance is stable to higher.    Labs (2/14): SPEP negative, UPEP negative, HIV negative Labs (7/14): K 4, creatinine 1.0, LDL 66 Labs (08/25/12): K 4, creatinine 1.0 Labs (10/06/12) : K 3.3 creatinine 0.7 pro-bnp 188 Labs (11/15/12): K 4.4, creatinine 1.04, pro-BNP 468  PMH: 1. HTN 2. Type II diabetes 3. Nonischemic cardiomyopathy: ? Due to HTN versus LBBB CMP.  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  Echo (2/14) with EF 20-25%.  Echo (7/14) with EF 25%, diffuse hypokinesis.  HIV, SPEP, UPEP negative.  Has LBBB. CRT-D 10/2012 (Medtronic).  4. Chronic LBBB 5. Right TKR 6. Bilateral THR.  7. Hyperlipidemia 8. ?COPD: Has oxygen for use with exertion.   SH: Prior smoker, quit 2/14.  Never drank ETOH.   No drugs. Lives with son.   FH: Mother with "heart trouble."   ROS: All systems reviewed and negative except as per HPI.   Current Outpatient Prescriptions  Medication Sig Dispense Refill  . aspirin EC 81 MG tablet Take 81 mg by mouth daily.      Marland Kitchen atorvastatin (LIPITOR) 20 MG tablet Take 20 mg by mouth daily.      . carvedilol (COREG) 6.25 MG tablet Take 1.5 tablets (9.375 mg total) by mouth 2 (two) times daily with a meal.  90 tablet  6  . enalapril (VASOTEC) 10 MG tablet Take 10 mg by mouth 2 (two) times daily.      . furosemide (LASIX) 40 MG tablet Take 1 tablet (40 mg total) by mouth 2 (two) times daily.  60 tablet  6  . hydrALAZINE (APRESOLINE) 50 MG tablet Take 1 tablet (50 mg total) by mouth 3 (three) times daily.  90 tablet  3  . insulin glargine (LANTUS) 100 UNIT/ML injection Inject 40 Units into the skin at bedtime.       . isosorbide mononitrate (IMDUR) 60 MG 24 hr tablet Take 1 tablet (60 mg total) by mouth daily.  30 tablet  3  . KLOR-CON 10 10 MEQ tablet TAKE 2 TABLETS BY MOUTH ONCE DAILY  30 tablet  6  . metFORMIN (GLUCOPHAGE) 1000 MG tablet Take 1 tablet (1,000  mg total) by mouth 2 (two) times daily with a meal. HOLD for 2 days then resume on 11/21/2012      . spironolactone (ALDACTONE) 25 MG tablet Take 25 mg by mouth daily.       No current facility-administered medications for this encounter.    Filed Vitals:   04/25/13 1138  BP: 144/76  Pulse: 104  Weight: 231 lb 8 oz (105.008 kg)  SpO2: 95%   General: NAD Neck: JVP 7, no thyromegaly or thyroid nodule.  Lungs: Clear to auscultation bilaterally with normal respiratory effort. CV: Nondisplaced PMI.  Heart regular S1/S2, no S3/S4, no murmur. No edema.  No carotid bruit.  Normal pedal pulses.  Abdomen: Soft, nontender, no hepatosplenomegaly, no distention.  Skin: Intact without lesions or rashes.  Neurologic: Alert and oriented x 3.   Psych: Normal affect. Extremities: No clubbing or cyanosis.    Assessment/Plan: 1. Chronic systolic CHF: Nonischemic cardiomyopathy. S/P CRT-D (Medtronic). Patient has stable NYHA class III symptoms, however reports feeling better since CRT-D. Weight is up, but by Optivol and by clinical exam, she does not appear volume overloaded.  Weight gain may be due to sedentary lifestyle and poor diet. - Continue Lasix at 40 mg bid.  - Increase hydralazine to 50 mg tid and Imdur to 60 mg daily.  - Continue Coreg 9.375 mg bid, enalapril 10 mg bid and spironolactone 25 mg daily. - Check BMET/BNP today.  - Return in 2 months to clinic with echo that day.  - Reinforced the need and importance of daily weights, a low sodium diet, and fluid restriction (less than 2 L a day). Instructed to call the HF clinic if weight increases more than 3 lbs overnight or 5 lbs in a week.  2. Hyperlipidemia: Last lipid profile 7/14 which was good. Will continue current dose.  3. HTN: Mildly elevated BP today.  As above, increasing hydralazine.   Follow up 2 months with ECHO.   [name] NP-C 04/25/2013 11:37 PM

## 2013-04-26 NOTE — Telephone Encounter (Signed)
Left message for [facility], explaining [name] did not order this and I wasn't sure whom to forward this to. I asked her  to call back with correct ordering physician.

## 2013-04-28 ENCOUNTER — Telehealth (HOSPITAL_COMMUNITY): Payer: Self-pay | Admitting: *Deleted

## 2013-04-28 DIAGNOSIS — I5022 Chronic systolic (congestive) heart failure: Secondary | ICD-10-CM

## 2013-04-28 DIAGNOSIS — R0902 Hypoxemia: Secondary | ICD-10-CM

## 2013-04-28 NOTE — Telephone Encounter (Signed)
Received call from [facility] with [facility] pt is requesting portable eval, they just need order to eval pt for portable o2 and they can see what she will qualify for, order placed

## 2013-05-08 ENCOUNTER — Other Ambulatory Visit: Payer: Self-pay | Admitting: Cardiology

## 2013-05-11 ENCOUNTER — Ambulatory Visit (HOSPITAL_COMMUNITY)
Admission: RE | Admit: 2013-05-11 | Discharge: 2013-05-11 | Disposition: A | Discharge status: Home / Self Care | Payer: PRIVATE HEALTH INSURANCE | Source: Ambulatory Visit | Attending: Internal Medicine | Admitting: Internal Medicine

## 2013-05-11 DIAGNOSIS — E785 Hyperlipidemia, unspecified: Secondary | ICD-10-CM | POA: Insufficient documentation

## 2013-05-11 DIAGNOSIS — F17200 Nicotine dependence, unspecified, uncomplicated: Secondary | ICD-10-CM | POA: Insufficient documentation

## 2013-05-11 DIAGNOSIS — E119 Type 2 diabetes mellitus without complications: Secondary | ICD-10-CM | POA: Insufficient documentation

## 2013-05-11 DIAGNOSIS — I2510 Atherosclerotic heart disease of native coronary artery without angina pectoris: Secondary | ICD-10-CM | POA: Insufficient documentation

## 2013-05-11 DIAGNOSIS — I509 Heart failure, unspecified: Secondary | ICD-10-CM | POA: Insufficient documentation

## 2013-05-11 DIAGNOSIS — I369 Nonrheumatic tricuspid valve disorder, unspecified: Secondary | ICD-10-CM

## 2013-05-11 DIAGNOSIS — I10 Essential (primary) hypertension: Secondary | ICD-10-CM | POA: Insufficient documentation

## 2013-05-11 DIAGNOSIS — I5022 Chronic systolic (congestive) heart failure: Secondary | ICD-10-CM

## 2013-05-18 ENCOUNTER — Encounter: Payer: Self-pay | Admitting: Internal Medicine

## 2013-06-08 ENCOUNTER — Other Ambulatory Visit (HOSPITAL_COMMUNITY): Payer: Self-pay | Admitting: Adult Health

## 2013-06-13 ENCOUNTER — Ambulatory Visit (HOSPITAL_COMMUNITY): Payer: PRIVATE HEALTH INSURANCE

## 2013-06-14 ENCOUNTER — Ambulatory Visit (HOSPITAL_COMMUNITY)
Admission: RE | Admit: 2013-06-14 | Discharge: 2013-06-14 | Disposition: A | Discharge status: Home / Self Care | Payer: PRIVATE HEALTH INSURANCE | Source: Ambulatory Visit | Attending: Internal Medicine | Admitting: Internal Medicine

## 2013-06-14 ENCOUNTER — Ambulatory Visit (HOSPITAL_BASED_OUTPATIENT_CLINIC_OR_DEPARTMENT_OTHER)
Admission: RE | Admit: 2013-06-14 | Discharge: 2013-06-14 | Disposition: A | Discharge status: Home / Self Care | Payer: PRIVATE HEALTH INSURANCE | Source: Ambulatory Visit | Attending: Internal Medicine | Admitting: Internal Medicine

## 2013-06-14 ENCOUNTER — Encounter (HOSPITAL_COMMUNITY): Payer: Self-pay

## 2013-06-14 VITALS — BP 128/72 | HR 112 | Wt 238.0 lb

## 2013-06-14 DIAGNOSIS — Z96659 Presence of unspecified artificial knee joint: Secondary | ICD-10-CM | POA: Insufficient documentation

## 2013-06-14 DIAGNOSIS — I428 Other cardiomyopathies: Secondary | ICD-10-CM | POA: Insufficient documentation

## 2013-06-14 DIAGNOSIS — I5022 Chronic systolic (congestive) heart failure: Secondary | ICD-10-CM

## 2013-06-14 DIAGNOSIS — I10 Essential (primary) hypertension: Secondary | ICD-10-CM

## 2013-06-14 DIAGNOSIS — Z96649 Presence of unspecified artificial hip joint: Secondary | ICD-10-CM | POA: Insufficient documentation

## 2013-06-14 DIAGNOSIS — Z87891 Personal history of nicotine dependence: Secondary | ICD-10-CM | POA: Insufficient documentation

## 2013-06-14 DIAGNOSIS — R000 Tachycardia, unspecified: Secondary | ICD-10-CM

## 2013-06-14 DIAGNOSIS — E119 Type 2 diabetes mellitus without complications: Secondary | ICD-10-CM | POA: Insufficient documentation

## 2013-06-14 DIAGNOSIS — I447 Left bundle-branch block, unspecified: Secondary | ICD-10-CM | POA: Insufficient documentation

## 2013-06-14 DIAGNOSIS — I509 Heart failure, unspecified: Secondary | ICD-10-CM | POA: Insufficient documentation

## 2013-06-14 DIAGNOSIS — E785 Hyperlipidemia, unspecified: Secondary | ICD-10-CM | POA: Insufficient documentation

## 2013-06-14 LAB — COMPREHENSIVE METABOLIC PANEL
ALT: 9 U/L (ref 0–35)
AST: 13 U/L (ref 0–37)
Albumin: 3.9 g/dL (ref 3.5–5.2)
Alkaline Phosphatase: 87 U/L (ref 39–117)
BUN: 17 mg/dL (ref 6–23)
CO2: 29 mEq/L (ref 19–32)
Calcium: 9.7 mg/dL (ref 8.4–10.5)
Chloride: 101 mEq/L (ref 96–112)
Creatinine, Ser: 0.85 mg/dL (ref 0.50–1.10)
GFR calc Af Amer: 85 mL/min — ABNORMAL LOW (ref >90)
GFR calc non Af Amer: 73 mL/min — ABNORMAL LOW (ref >90)
Glucose, Bld: 199 mg/dL — ABNORMAL HIGH (ref 70–99)
Potassium: 4.1 mEq/L (ref 3.7–5.3)
Sodium: 142 mEq/L (ref 137–147)
Total Bilirubin: 0.2 mg/dL — ABNORMAL LOW (ref 0.3–1.2)
Total Protein: 7.8 g/dL (ref 6.0–8.3)

## 2013-06-14 LAB — CBC
HCT: 33.9 % — ABNORMAL LOW (ref 36.0–46.0)
Hemoglobin: 11.7 g/dL — ABNORMAL LOW (ref 12.0–15.0)
MCH: 30.8 pg (ref 26.0–34.0)
MCHC: 34.5 g/dL (ref 30.0–36.0)
MCV: 89.2 fL (ref 78.0–100.0)
Platelets: 377 10*3/uL (ref 150–400)
RBC: 3.8 MIL/uL — ABNORMAL LOW (ref 3.87–5.11)
RDW: 12.5 % (ref 11.5–15.5)
WBC: 8.9 10*3/uL (ref 4.0–10.5)

## 2013-06-14 LAB — T3, FREE: T3, Free: 2.7 pg/mL (ref 2.3–4.2)

## 2013-06-14 LAB — TSH: TSH: 0.72 u[IU]/mL (ref 0.350–4.500)

## 2013-06-14 LAB — T4, FREE: Free T4: 1.17 ng/dL (ref 0.80–1.80)

## 2013-06-14 MED ORDER — CARVEDILOL 25 MG PO TABS: 25.0000 mg | ORAL_TABLET | Freq: Two times a day (BID) | ORAL | Status: DC

## 2013-06-14 NOTE — Patient Instructions (Addendum)
Increase Carvedilol to 25 mg Twice daily   Labs today  We will contact you in 3 months to schedule your next appointment.  

## 2013-06-14 NOTE — Progress Notes (Signed)
Patient ID: [name], female   DOB: 1950-03-30, 63 y.o.   MRN: [id number] PCP: Dr. Jonelle Sidle  62 yo with history of nonischemic cardiomyopathy presents for cardiology followup.  She was admitted with CHF exacerbation in 02/2012.  EF 20-25% on echo, LHC with nonobstructive CAD.  She was started on cardiac meds and discharged.  In 07/2012, she was admitted again with CHF exacerbation.  She had run out of Lasix.  She was taking her other heart medications as ordered, however.  She was diuresed and discharged. She had a chronic LBBB, and Medtronic CRT-D device was placed in 10/14.  Repeat echo in 04/2013 showed that EF now 45-50%   Doing very well. Able to do all activities without problem. No orthopnea or PND. No CP. Chronic DOE. Weight up and down.  She says she has not been active over the winter and has been eating more than normal. "I need to get up and walk." She does not think that she is retaining fluid.  She is compliant with her medications.   Optivol was assessed today: Fluid index is well below threshold and thoracic impedance is stable to higher.    Labs (2/14): SPEP negative, UPEP negative, HIV negative Labs (7/14): K 4, creatinine 1.0, LDL 66 Labs (08/25/12): K 4, creatinine 1.0 Labs (10/06/12) : K 3.3 creatinine 0.7 pro-bnp 188 Labs (11/15/12): K 4.4, creatinine 1.04, pro-BNP 468  PMH: 1. HTN 2. Type II diabetes 3. Nonischemic cardiomyopathy: ? Due to HTN versus LBBB CMP.  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  Echo (2/14) with EF 20-25%.  Echo (7/14) with EF 25%, diffuse hypokinesis.  HIV, SPEP, UPEP negative.  Has LBBB. CRT-D 10/2012 (Medtronic).  4. Chronic LBBB 5. Right TKR 6. Bilateral THR.  7. Hyperlipidemia 8. ?COPD: Has oxygen for use with exertion.   SH: Prior smoker, quit 2/14.  Never drank ETOH.  No drugs. Lives with son.   FH: Mother with "heart trouble."   ROS: All systems reviewed and negative except as per HPI.   Current Outpatient Prescriptions  Medication Sig  Dispense Refill  . aspirin EC 81 MG tablet Take 81 mg by mouth daily.      Marland Kitchen atorvastatin (LIPITOR) 20 MG tablet Take 20 mg by mouth daily.      Marland Kitchen atorvastatin (LIPITOR) 20 MG tablet TAKE 1 TABLET BY MOUTH DAILY  30 tablet  0  . carvedilol (COREG) 6.25 MG tablet Take 12.5 mg by mouth 2 (two) times daily with a meal.      . enalapril (VASOTEC) 10 MG tablet TAKE 1 TABLET BY MOUTH TWICE DAILY  60 tablet  6  . furosemide (LASIX) 40 MG tablet Take 1 tablet (40 mg total) by mouth 2 (two) times daily.  60 tablet  6  . hydrALAZINE (APRESOLINE) 50 MG tablet Take 1 tablet (50 mg total) by mouth 3 (three) times daily.  90 tablet  3  . insulin glargine (LANTUS) 100 UNIT/ML injection Inject 40 Units into the skin at bedtime.       . isosorbide mononitrate (IMDUR) 60 MG 24 hr tablet Take 1 tablet (60 mg total) by mouth daily.  30 tablet  3  . KLOR-CON 10 10 MEQ tablet TAKE 2 TABLETS BY MOUTH ONCE DAILY  30 tablet  6  . metFORMIN (GLUCOPHAGE) 1000 MG tablet Take 1 tablet (1,000 mg total) by mouth 2 (two) times daily with a meal. HOLD for 2 days then resume on 11/21/2012      .  spironolactone (ALDACTONE) 25 MG tablet Take 25 mg by mouth daily.       No current facility-administered medications for this encounter.    Filed Vitals:   06/14/13 1023  BP: 128/72  Pulse: 112  Weight: 238 lb (107.956 kg)  SpO2: 97%   General: NAD Neck: JVP 6, no thyromegaly or thyroid nodule.  Lungs: Clear to auscultation bilaterally with normal respiratory effort. CV: Nondisplaced PMI.  Heart regular tachy S1/S2, no S3/S4, no murmur. No edema.  No carotid bruit.  Normal pedal pulses.  Abdomen: Soft, nontender, no hepatosplenomegaly, no distention.  Skin: Intact without lesions or rashes.  Neurologic: Alert and oriented x 3.   Psych: Normal affect. Extremities: No clubbing or cyanosis.   Assessment/Plan: 1. Chronic systolic CHF: Nonischemic cardiomyopathy. S/P CRT-D (Medtronic). CRT-D placed 10/14. EF now 45-50% NYHA II.    - Doing well. EF much improved. But weight up some - I don't think this is fluid. Will confirm with Optivol  - HR elevated. Check ECG and labs including CBC, CMET, TFTs - Continue current medicines with increase carvedilol (was taking 12.5 am/18.75pm) -> increase to 25 bid.  - Reinforced the need and importance of daily weights, a low sodium diet, and fluid restriction (less than 2 L a day). Instructed to call the HF clinic if weight increases more than 3 lbs overnight or 5 lbs in a week.  2. Hyperlipidemia: Last lipid profile 7/14 which was good. Will continue current dose.  3. HTN: Blood pressure well controlled.   ECG: Sinus rhythm 94. No ST-T wave abnormalities.   Optivol interrogated: No AF/VT. 1 fluid crossing around May 14. Now back to baseline. Activity 4-5 hours per day. 100% pacing  Shaune Pascal [name] MD 06/14/2013 10:55 AM

## 2013-06-16 ENCOUNTER — Telehealth: Payer: Self-pay | Admitting: Radiology

## 2013-06-16 NOTE — Telephone Encounter (Signed)
Patient states that she is no longer experiencing pelvic pain or bloating.  She does not feel that she needs to follow up w/ Dr Barbie Banner at this time.     Barak Bialecki Riki Rusk, RN 06/16/2013 10:56 AM

## 2013-06-29 ENCOUNTER — Encounter: Payer: PRIVATE HEALTH INSURANCE | Admitting: *Deleted

## 2013-06-29 ENCOUNTER — Telehealth: Payer: Self-pay | Admitting: Cardiology

## 2013-06-29 NOTE — Telephone Encounter (Signed)
Spoke with pt and reminded pt of remote transmission that is due today. Pt verbalized understanding.   

## 2013-07-06 ENCOUNTER — Other Ambulatory Visit (HOSPITAL_COMMUNITY): Payer: Self-pay | Admitting: Internal Medicine

## 2013-08-18 ENCOUNTER — Telehealth: Payer: Self-pay | Admitting: Cardiology

## 2013-08-18 NOTE — Telephone Encounter (Signed)
Left message to call back  

## 2013-08-18 NOTE — Telephone Encounter (Signed)
New message     Need ejection fraction for heart failure program

## 2013-08-19 NOTE — Telephone Encounter (Signed)
Left message to call back  

## 2013-08-23 ENCOUNTER — Encounter (HOSPITAL_COMMUNITY): Payer: Self-pay | Admitting: Vascular Surgery

## 2013-08-25 NOTE — Telephone Encounter (Signed)
Left message for [facility] with pt, gave pt echo result in the event she speaks with [facility] before she returns our call

## 2013-09-15 ENCOUNTER — Ambulatory Visit (HOSPITAL_COMMUNITY)
Admission: RE | Admit: 2013-09-15 | Discharge: 2013-09-15 | Disposition: A | Discharge status: Home / Self Care | Payer: PRIVATE HEALTH INSURANCE | Source: Ambulatory Visit | Attending: Internal Medicine | Admitting: Internal Medicine

## 2013-09-15 ENCOUNTER — Encounter (HOSPITAL_COMMUNITY): Payer: Self-pay

## 2013-09-15 VITALS — BP 146/86 | HR 87 | Wt 233.8 lb

## 2013-09-15 DIAGNOSIS — I5022 Chronic systolic (congestive) heart failure: Secondary | ICD-10-CM | POA: Insufficient documentation

## 2013-09-15 DIAGNOSIS — E785 Hyperlipidemia, unspecified: Secondary | ICD-10-CM | POA: Diagnosis not present

## 2013-09-15 DIAGNOSIS — Z7982 Long term (current) use of aspirin: Secondary | ICD-10-CM | POA: Insufficient documentation

## 2013-09-15 DIAGNOSIS — Z794 Long term (current) use of insulin: Secondary | ICD-10-CM | POA: Diagnosis not present

## 2013-09-15 DIAGNOSIS — I447 Left bundle-branch block, unspecified: Secondary | ICD-10-CM | POA: Diagnosis not present

## 2013-09-15 DIAGNOSIS — Z96649 Presence of unspecified artificial hip joint: Secondary | ICD-10-CM | POA: Diagnosis not present

## 2013-09-15 DIAGNOSIS — Z96659 Presence of unspecified artificial knee joint: Secondary | ICD-10-CM | POA: Insufficient documentation

## 2013-09-15 DIAGNOSIS — I10 Essential (primary) hypertension: Secondary | ICD-10-CM | POA: Diagnosis not present

## 2013-09-15 DIAGNOSIS — E119 Type 2 diabetes mellitus without complications: Secondary | ICD-10-CM | POA: Diagnosis not present

## 2013-09-15 DIAGNOSIS — J449 Chronic obstructive pulmonary disease, unspecified: Secondary | ICD-10-CM | POA: Insufficient documentation

## 2013-09-15 DIAGNOSIS — I509 Heart failure, unspecified: Secondary | ICD-10-CM

## 2013-09-15 DIAGNOSIS — I428 Other cardiomyopathies: Secondary | ICD-10-CM | POA: Insufficient documentation

## 2013-09-15 DIAGNOSIS — Z87891 Personal history of nicotine dependence: Secondary | ICD-10-CM | POA: Insufficient documentation

## 2013-09-15 DIAGNOSIS — J4489 Other specified chronic obstructive pulmonary disease: Secondary | ICD-10-CM | POA: Insufficient documentation

## 2013-09-15 LAB — BASIC METABOLIC PANEL
Anion gap: 12 (ref 5–15)
BUN: 16 mg/dL (ref 6–23)
CO2: 27 mEq/L (ref 19–32)
Calcium: 9.8 mg/dL (ref 8.4–10.5)
Chloride: 96 mEq/L (ref 96–112)
Creatinine, Ser: 0.79 mg/dL (ref 0.50–1.10)
GFR calc Af Amer: >90 mL/min (ref >90)
GFR calc non Af Amer: 89 mL/min — ABNORMAL LOW (ref >90)
Glucose, Bld: 401 mg/dL — ABNORMAL HIGH (ref 70–99)
Potassium: 4.6 mEq/L (ref 3.7–5.3)
Sodium: 135 mEq/L — ABNORMAL LOW (ref 137–147)

## 2013-09-15 NOTE — Progress Notes (Signed)
Patient ID: [name], female   DOB: 07-15-1950, 63 y.o.   MRN: [id number] PCP: Dr. Jonelle Sidle  63 yo with history of nonischemic cardiomyopathy presents for cardiology followup.  She was admitted with CHF exacerbation in 02/2012.  EF 20-25% on echo, LHC with nonobstructive CAD.  She was started on cardiac meds and discharged.  In 07/2012, she was admitted again with CHF exacerbation.  She had run out of Lasix.  She was taking her other heart medications as ordered, however.  She was diuresed and discharged. She had a chronic LBBB, and Medtronic CRT-D device was placed in 10/14.    Repeat echo in 04/2013 showed that EF now 45-50%   She returns for follow up. Overall she feels great. Denies SOB/PND/Orthopnea. Able to walk to the back of the grocery store. Taking all medications. Not exercising. Weight at 221-222 pounds.   Optivol was assessed today: Fluid index is well below threshold. .    Labs (2/14): SPEP negative, UPEP negative, HIV negative Labs (7/14): K 4, creatinine 1.0, LDL 66 Labs (08/25/12): K 4, creatinine 1.0 Labs (10/06/12) : K 3.3 creatinine 0.7 pro-bnp 188 Labs (11/15/12): K 4.4, creatinine 1.04, pro-BNP 468 Labs (3/15) K 4.3 Creatinine 0.83 Labs (5/15) K 4.1 Creatinine 0.85  PMH: 1. HTN 2. Type II diabetes 3. Nonischemic cardiomyopathy: ? Due to HTN versus LBBB CMP.  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  Echo (2/14) with EF 20-25%.  Echo (7/14) with EF 25%, diffuse hypokinesis.  HIV, SPEP, UPEP negative.  Has LBBB. CRT-D 10/2012 (Medtronic).  Echo (4/15) with EF 45-50%, mild diffuse hypokinesis, PA systolic pressure 38 mmHg.  4. Chronic LBBB 5. Right TKR 6. Bilateral THR.  7. Hyperlipidemia 8. ?COPD: Has oxygen for use with exertion.   SH: Prior smoker, quit 2/14.  Never drank ETOH.  No drugs. Lives with son.   FH: Mother with "heart trouble."   ROS: All systems reviewed and negative except as per HPI.   Current Outpatient Prescriptions  Medication Sig Dispense Refill  .  aspirin EC 81 MG tablet Take 81 mg by mouth daily.      Marland Kitchen atorvastatin (LIPITOR) 20 MG tablet TAKE 1 TABLET BY MOUTH DAILY  30 tablet  6  . carvedilol (COREG) 25 MG tablet Take 1 tablet (25 mg total) by mouth 2 (two) times daily with a meal.  60 tablet  3  . enalapril (VASOTEC) 10 MG tablet TAKE 1 TABLET BY MOUTH TWICE DAILY  60 tablet  6  . furosemide (LASIX) 40 MG tablet Take 1 tablet (40 mg total) by mouth 2 (two) times daily.  60 tablet  6  . hydrALAZINE (APRESOLINE) 50 MG tablet Take 1 tablet (50 mg total) by mouth 3 (three) times daily.  90 tablet  3  . insulin glargine (LANTUS) 100 UNIT/ML injection Inject 40 Units into the skin at bedtime.       . isosorbide mononitrate (IMDUR) 60 MG 24 hr tablet Take 1 tablet (60 mg total) by mouth daily.  30 tablet  3  . KLOR-CON 10 10 MEQ tablet TAKE 2 TABLETS BY MOUTH ONCE DAILY  30 tablet  6  . metFORMIN (GLUCOPHAGE) 1000 MG tablet Take 1 tablet (1,000 mg total) by mouth 2 (two) times daily with a meal. HOLD for 2 days then resume on 11/21/2012      . spironolactone (ALDACTONE) 25 MG tablet Take 25 mg by mouth daily.       No current facility-administered medications for this encounter.  Filed Vitals:   09/15/13 0859  BP: 146/86  Pulse: 87  Weight: 233 lb 12.8 oz (106.051 kg)  SpO2: 96%   General: NAD Neck: JVP 7, no thyromegaly or thyroid nodule.  Lungs: Clear to auscultation bilaterally with normal respiratory effort. CV: Nondisplaced PMI.  Heart regular tachy S1/S2, no S3/S4, no murmur. No edema.  No carotid bruit.  Normal pedal pulses.  Abdomen: Soft, nontender, no hepatosplenomegaly, no distention.  Skin: Intact without lesions or rashes.  Neurologic: Alert and oriented x 3.   Psych: Normal affect. Extremities: No clubbing or cyanosis.   Assessment/Plan: 1. Chronic systolic CHF: Nonischemic cardiomyopathy. S/P CRT-D (Medtronic) placed 10/14. EF now 45-50%, NYHA II symptoms.  She is not volume overloaded on exam and Optivol is  consistent with euvolemia.    - Continue lasix 40 mg twice a day and 25 mg spironolactone daily.    - On goal coreg 25 bid. - On hydralazine 50 mg tid/imdur 60 mg dialy  - Continue enalapril 10 mg twice a day.  - Check BMET today.  - Reinforced the need and importance of daily weights, a low sodium diet, and fluid restriction (less than 2 L a day). Instructed to call the HF clinic if weight increases more than 3 lbs overnight or 5 lbs in a week.  2. Hyperlipidemia: Per Dr Jonelle Sidle.   3. HTN: Blood pressure well controlled.   Follow up 4 months   .CLEGG,AMY NP-C  09/15/2013 9:08 AM  Patient seen with NP, agree with the above note.  She is doing well, NYHA class II.  EF improved on last echo in 45-50%.  Continue current meds and check BMET.   [name] 09/15/2013

## 2013-09-15 NOTE — Patient Instructions (Signed)
Follow up in 3-4 months  Do the following things EVERYDAY: 1) Weigh yourself in the morning before breakfast. Write it down and keep it in a log. 2) Take your medicines as prescribed 3) Eat low salt foods-Limit salt (sodium) to 2000 mg per day.  4) Stay as active as you can everyday 5) Limit all fluids for the day to less than 2 liters 

## 2013-09-28 ENCOUNTER — Encounter: Payer: Self-pay | Admitting: Internal Medicine

## 2013-11-24 ENCOUNTER — Other Ambulatory Visit: Payer: Self-pay | Admitting: Internal Medicine

## 2013-11-24 DIAGNOSIS — Z1231 Encounter for screening mammogram for malignant neoplasm of breast: Secondary | ICD-10-CM

## 2013-11-29 ENCOUNTER — Encounter: Payer: Self-pay | Admitting: *Deleted

## 2013-12-08 ENCOUNTER — Ambulatory Visit: Payer: PRIVATE HEALTH INSURANCE

## 2013-12-19 ENCOUNTER — Encounter (HOSPITAL_COMMUNITY): Payer: PRIVATE HEALTH INSURANCE

## 2013-12-23 ENCOUNTER — Institutional Professional Consult (permissible substitution): Payer: Medicare Other | Admitting: Internal Medicine

## 2013-12-26 ENCOUNTER — Encounter: Payer: Self-pay | Admitting: Internal Medicine

## 2013-12-29 ENCOUNTER — Telehealth (HOSPITAL_COMMUNITY): Payer: Self-pay | Admitting: Anesthesiology

## 2013-12-29 ENCOUNTER — Ambulatory Visit (HOSPITAL_COMMUNITY)
Admission: RE | Admit: 2013-12-29 | Discharge: 2013-12-29 | Disposition: A | Discharge status: Home / Self Care | Payer: Medicare Other | Source: Ambulatory Visit | Attending: Internal Medicine | Admitting: Internal Medicine

## 2013-12-29 ENCOUNTER — Encounter (HOSPITAL_COMMUNITY): Payer: Self-pay

## 2013-12-29 VITALS — BP 154/86 | HR 93 | Resp 22 | Wt 231.0 lb

## 2013-12-29 DIAGNOSIS — E785 Hyperlipidemia, unspecified: Secondary | ICD-10-CM | POA: Insufficient documentation

## 2013-12-29 DIAGNOSIS — I429 Cardiomyopathy, unspecified: Secondary | ICD-10-CM | POA: Insufficient documentation

## 2013-12-29 DIAGNOSIS — J449 Chronic obstructive pulmonary disease, unspecified: Secondary | ICD-10-CM | POA: Insufficient documentation

## 2013-12-29 DIAGNOSIS — E119 Type 2 diabetes mellitus without complications: Secondary | ICD-10-CM | POA: Insufficient documentation

## 2013-12-29 DIAGNOSIS — Z96651 Presence of right artificial knee joint: Secondary | ICD-10-CM | POA: Diagnosis not present

## 2013-12-29 DIAGNOSIS — I2510 Atherosclerotic heart disease of native coronary artery without angina pectoris: Secondary | ICD-10-CM | POA: Diagnosis not present

## 2013-12-29 DIAGNOSIS — I5022 Chronic systolic (congestive) heart failure: Secondary | ICD-10-CM

## 2013-12-29 DIAGNOSIS — Z87891 Personal history of nicotine dependence: Secondary | ICD-10-CM | POA: Diagnosis not present

## 2013-12-29 DIAGNOSIS — I10 Essential (primary) hypertension: Secondary | ICD-10-CM | POA: Diagnosis not present

## 2013-12-29 DIAGNOSIS — I447 Left bundle-branch block, unspecified: Secondary | ICD-10-CM | POA: Insufficient documentation

## 2013-12-29 DIAGNOSIS — Z96643 Presence of artificial hip joint, bilateral: Secondary | ICD-10-CM | POA: Diagnosis not present

## 2013-12-29 LAB — BASIC METABOLIC PANEL
Anion gap: 13 (ref 5–15)
BUN: 8 mg/dL (ref 6–23)
CO2: 26 mEq/L (ref 19–32)
Calcium: 9.2 mg/dL (ref 8.4–10.5)
Chloride: 102 mEq/L (ref 96–112)
Creatinine, Ser: 0.63 mg/dL (ref 0.50–1.10)
GFR calc Af Amer: >90 mL/min (ref >90)
GFR calc non Af Amer: >90 mL/min (ref >90)
Glucose, Bld: 248 mg/dL — ABNORMAL HIGH (ref 70–99)
Potassium: 3.1 mEq/L — ABNORMAL LOW (ref 3.7–5.3)
Sodium: 141 mEq/L (ref 137–147)

## 2013-12-29 LAB — PRO B NATRIURETIC PEPTIDE: Pro B Natriuretic peptide (BNP): 812.3 pg/mL — ABNORMAL HIGH (ref 0–125)

## 2013-12-29 NOTE — Progress Notes (Signed)
Patient ID: [name], female   DOB: Dec 26, 1950, 63 y.o.   MRN: [id number] PCP: Dr. Jonelle Sidle  63 yo with history of nonischemic cardiomyopathy presents for cardiology followup.  She was admitted with CHF exacerbation in 02/2012.  EF 20-25% on echo, LHC with nonobstructive CAD.  She was started on cardiac meds and discharged.  In 07/2012, she was admitted again with CHF exacerbation.  She had run out of Lasix.  She was taking her other heart medications as ordered, however.  She was diuresed and discharged. She had a chronic LBBB, and Medtronic CRT-D device was placed in 10/14.    Repeat echo in 04/2013 showed that EF now 45-50%   Follow up for Heart Failure: Does not feel good. Reports feeling SOB all the time. Sond shot last night and in the hospital. Can't walk from bedroom to bathroom without getting SOB. Denies CP or orthopnea. +LE edema and PND. Does not weigh daily. Not exercising. Following a low salt diet and drinking less than 2L  A day. Is suppose to wear O2 with ambulation but PCP did not renew because didn't send in so she does not have O2 currently.   Optivol was assessed today: Fluid index is well below threshold. .    Labs (2/14): SPEP negative, UPEP negative, HIV negative Labs (7/14): K 4, creatinine 1.0, LDL 66 Labs (08/25/12): K 4, creatinine 1.0 Labs (10/06/12) : K 3.3 creatinine 0.7 pro-bnp 188 Labs (11/15/12): K 4.4, creatinine 1.04, pro-BNP 468 Labs (3/15) K 4.3 Creatinine 0.83 Labs (5/15) K 4.1 Creatinine 0.85  PMH: 1. HTN 2. Type II diabetes 3. Nonischemic cardiomyopathy: ? Due to HTN versus LBBB CMP.  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  Echo (2/14) with EF 20-25%.  Echo (7/14) with EF 25%, diffuse hypokinesis.  HIV, SPEP, UPEP negative.  Has LBBB. CRT-D 10/2012 (Medtronic).  Echo (4/15) with EF 45-50%, mild diffuse hypokinesis, PA systolic pressure 38 mmHg.  4. Chronic LBBB 5. Right TKR 6. Bilateral THR.  7. Hyperlipidemia 8. ?COPD: Has oxygen for use with exertion.    SH: Prior smoker, quit 2/14.  Never drank ETOH.  No drugs. Lives with son.   FH: Mother with "heart trouble."   ROS: All systems reviewed and negative except as per HPI.   Current Outpatient Prescriptions  Medication Sig Dispense Refill  . aspirin EC 81 MG tablet Take 81 mg by mouth daily.    Marland Kitchen atorvastatin (LIPITOR) 20 MG tablet TAKE 1 TABLET BY MOUTH DAILY 30 tablet 6  . carvedilol (COREG) 25 MG tablet Take 1 tablet (25 mg total) by mouth 2 (two) times daily with a meal. 60 tablet 3  . enalapril (VASOTEC) 10 MG tablet TAKE 1 TABLET BY MOUTH TWICE DAILY 60 tablet 6  . furosemide (LASIX) 40 MG tablet Take 1 tablet (40 mg total) by mouth 2 (two) times daily. 60 tablet 6  . hydrALAZINE (APRESOLINE) 50 MG tablet Take 1 tablet (50 mg total) by mouth 3 (three) times daily. 90 tablet 3  . insulin glargine (LANTUS) 100 UNIT/ML injection Inject 40 Units into the skin at bedtime.     . isosorbide mononitrate (IMDUR) 60 MG 24 hr tablet Take 1 tablet (60 mg total) by mouth daily. 30 tablet 3  . KLOR-CON 10 10 MEQ tablet TAKE 2 TABLETS BY MOUTH ONCE DAILY 30 tablet 6  . metFORMIN (GLUCOPHAGE) 1000 MG tablet Take 1 tablet (1,000 mg total) by mouth 2 (two) times daily with a meal. HOLD for 2 days  then resume on 11/21/2012    . spironolactone (ALDACTONE) 25 MG tablet Take 25 mg by mouth daily.     No current facility-administered medications for this encounter.    Filed Vitals:   12/29/13 1356  BP: 154/86  Pulse: 93  Resp: 22  Weight: 231 lb (104.781 kg)  SpO2: 94%   General: NAD Neck: JVP 8, no thyromegaly or thyroid nodule.  Lungs: Clear to auscultation bilaterally with normal respiratory effort. CV: Nondisplaced PMI.  Heart regular tachy S1/S2, no S3/S4, no murmur. +trace edema.  No carotid bruit.  Normal pedal pulses.  Abdomen: Soft, nontender, no hepatosplenomegaly, no distention.  Skin: Intact without lesions or rashes.  Neurologic: Alert and oriented x 3.   Psych: Normal  affect. Extremities: No clubbing or cyanosis.   Assessment/Plan: 1. Chronic systolic CHF: Nonischemic cardiomyopathy. S/P CRT-D (Medtronic) placed 10/14. EF now 45-50% (04/2013) - NYHA IIIb symptoms and volume status mildly elevated. Will give an extra 40 mg lasix today and tomorrow. Told to call on Monday if not feeling better. Check BMET and pro-BNP today.  - ICD/Optivol interrogated: Fluid way above threshold, however on exam does not appear overly volume overloaded and weight is actually down from last visit. Patient activity 6 hrs a day and BiV pacing 100%. No VT or AF. Will set up follow up appointment with device clinic she has no showed and cancelled multiple times.  - Continue current medications. BP slightly elevated however will not titrate any medications d/t patient is under a lot of stress currently with son being in the hospital after being shot.  - Reinforced the need and importance of daily weights, a low sodium diet, and fluid restriction (less than 2 L a day). Instructed to call the HF clinic if weight increases more than 3 lbs overnight or 5 lbs in a week.  2. Hyperlipidemia: Per Dr Jonelle Sidle.   3. HTN: Blood pressure elevated. As above no changes to medications currently with stress which is likely causing BP to be elevated. Will continue to follow. 4. COPD: - reports she is more SOB than usual and does not appear to be related all to fluid. As above mildly volume overloaded which we are having her take extra lasix. She no longer has her O2 and reports can't get in with PCP to get reordered. Ambulated in clinic and sats dropped to 87% on RA and when placed on 2L returned to 98%. Will send for [facility] for O2 for ambulation, 2L.  F/U 2 weeks.  Junie Bame B NP-C  12/29/2013 2:21 PM  Patient seen and examined with Junie Bame, NP. We discussed all aspects of the encounter. I agree with the assessment and plan as stated above.   She is mildly volume overloaded. Agree with increasing  lasix. ICD interrogated personally. Optivol doesn't seem to be accurate. No VT/AF. Activity level looks good. Can consider RHC as needed in future if symptoms persist and discrepancy in determining volume status.   Molley Houser,MD 8:52 AM

## 2013-12-29 NOTE — Progress Notes (Signed)
SATURATION QUALIFICATIONS: (This note is used to comply with regulatory documentation for home oxygen)  Patient Saturations on Room Air at Rest = 93%  Patient Saturations on Room Air while Ambulating = 87%  Patient Saturations on 2 Liters of oxygen while Ambulating = 98%  Please briefly explain why patient needs home oxygen: Patient quickly desats and becomes symptomatic with shortness of breath and labored breathing with sats 87% on room air.

## 2013-12-29 NOTE — Telephone Encounter (Signed)
Labs reviewed and KCL 3.1. Left message on VM to take 40 meq extra KCL today. Will call to follow up tomorrow.   12/30/13: Follow up: Called patient back to make sure she received the VM and she reports she did and took the extra KCL.

## 2013-12-29 NOTE — Patient Instructions (Signed)
Take extra 40 mg lasix today and take extra 40 mg lasix tomorrow.   Call Monday to let us know if feeling better.  Need to follow up with ICD clinic.  We are praying for your son.   Will reorder home Oxygen, wear 2L for ambulation.  Follow up in 2 weeks.  Do the following things EVERYDAY: 1) Weigh yourself in the morning before breakfast. Write it down and keep it in a log. 2) Take your medicines as prescribed 3) Eat low salt foods-Limit salt (sodium) to 2000 mg per day.  4) Stay as active as you can everyday 5) Limit all fluids for the day to less than 2 liters 6)

## 2014-01-02 ENCOUNTER — Encounter: Payer: Self-pay | Admitting: Internal Medicine

## 2014-01-05 ENCOUNTER — Encounter (HOSPITAL_COMMUNITY): Payer: Self-pay | Admitting: Cardiology

## 2014-01-05 ENCOUNTER — Emergency Department (HOSPITAL_COMMUNITY)
Admission: EM | Admit: 2014-01-05 | Discharge: 2014-01-05 | Disposition: A | Discharge status: Home / Self Care | Payer: Medicare Other | Attending: Emergency Medicine | Admitting: Emergency Medicine

## 2014-01-05 ENCOUNTER — Emergency Department (HOSPITAL_COMMUNITY): Payer: Medicare Other

## 2014-01-05 DIAGNOSIS — R079 Chest pain, unspecified: Secondary | ICD-10-CM

## 2014-01-05 DIAGNOSIS — I5042 Chronic combined systolic (congestive) and diastolic (congestive) heart failure: Secondary | ICD-10-CM | POA: Insufficient documentation

## 2014-01-05 DIAGNOSIS — Z87891 Personal history of nicotine dependence: Secondary | ICD-10-CM | POA: Diagnosis not present

## 2014-01-05 DIAGNOSIS — E780 Pure hypercholesterolemia: Secondary | ICD-10-CM | POA: Insufficient documentation

## 2014-01-05 DIAGNOSIS — R05 Cough: Secondary | ICD-10-CM

## 2014-01-05 DIAGNOSIS — Z794 Long term (current) use of insulin: Secondary | ICD-10-CM | POA: Diagnosis not present

## 2014-01-05 DIAGNOSIS — Z7982 Long term (current) use of aspirin: Secondary | ICD-10-CM | POA: Diagnosis not present

## 2014-01-05 DIAGNOSIS — Z95810 Presence of automatic (implantable) cardiac defibrillator: Secondary | ICD-10-CM | POA: Diagnosis not present

## 2014-01-05 DIAGNOSIS — R0789 Other chest pain: Secondary | ICD-10-CM | POA: Diagnosis not present

## 2014-01-05 DIAGNOSIS — Z79899 Other long term (current) drug therapy: Secondary | ICD-10-CM | POA: Insufficient documentation

## 2014-01-05 DIAGNOSIS — I10 Essential (primary) hypertension: Secondary | ICD-10-CM | POA: Diagnosis not present

## 2014-01-05 DIAGNOSIS — G8929 Other chronic pain: Secondary | ICD-10-CM | POA: Diagnosis not present

## 2014-01-05 DIAGNOSIS — R059 Cough, unspecified: Secondary | ICD-10-CM

## 2014-01-05 DIAGNOSIS — E119 Type 2 diabetes mellitus without complications: Secondary | ICD-10-CM | POA: Diagnosis not present

## 2014-01-05 DIAGNOSIS — Z862 Personal history of diseases of the blood and blood-forming organs and certain disorders involving the immune mechanism: Secondary | ICD-10-CM | POA: Insufficient documentation

## 2014-01-05 DIAGNOSIS — I2510 Atherosclerotic heart disease of native coronary artery without angina pectoris: Secondary | ICD-10-CM | POA: Diagnosis not present

## 2014-01-05 LAB — CBC
HCT: 32 % — ABNORMAL LOW (ref 36.0–46.0)
Hemoglobin: 10.8 g/dL — ABNORMAL LOW (ref 12.0–15.0)
MCH: 30.9 pg (ref 26.0–34.0)
MCHC: 33.8 g/dL (ref 30.0–36.0)
MCV: 91.4 fL (ref 78.0–100.0)
Platelets: 372 10*3/uL (ref 150–400)
RBC: 3.5 MIL/uL — ABNORMAL LOW (ref 3.87–5.11)
RDW: 13 % (ref 11.5–15.5)
WBC: 9.6 10*3/uL (ref 4.0–10.5)

## 2014-01-05 LAB — I-STAT TROPONIN, ED: Troponin i, poc: 0.01 ng/mL (ref 0.00–0.08)

## 2014-01-05 LAB — COMPREHENSIVE METABOLIC PANEL
ALT: 10 U/L (ref 0–35)
AST: 10 U/L (ref 0–37)
Albumin: 3.5 g/dL (ref 3.5–5.2)
Alkaline Phosphatase: 79 U/L (ref 39–117)
Anion gap: 16 — ABNORMAL HIGH (ref 5–15)
BUN: 10 mg/dL (ref 6–23)
CO2: 23 mEq/L (ref 19–32)
Calcium: 9.7 mg/dL (ref 8.4–10.5)
Chloride: 98 mEq/L (ref 96–112)
Creatinine, Ser: 0.67 mg/dL (ref 0.50–1.10)
GFR calc Af Amer: >90 mL/min (ref >90)
GFR calc non Af Amer: >90 mL/min (ref >90)
Glucose, Bld: 253 mg/dL — ABNORMAL HIGH (ref 70–99)
Potassium: 4 mEq/L (ref 3.7–5.3)
Sodium: 137 mEq/L (ref 137–147)
Total Bilirubin: 0.3 mg/dL (ref 0.3–1.2)
Total Protein: 7 g/dL (ref 6.0–8.3)

## 2014-01-05 LAB — D-DIMER, QUANTITATIVE: D-Dimer, Quant: 0.6 ug/mL-FEU — ABNORMAL HIGH (ref 0.00–0.48)

## 2014-01-05 LAB — PRO B NATRIURETIC PEPTIDE: Pro B Natriuretic peptide (BNP): 818.5 pg/mL — ABNORMAL HIGH (ref 0–125)

## 2014-01-05 MED ORDER — FENTANYL CITRATE 0.05 MG/ML IJ SOLN
50.0000 ug | Freq: Once | INTRAMUSCULAR | Status: DC
  Filled 2014-01-05: qty 2

## 2014-01-05 MED ORDER — ASPIRIN 81 MG PO CHEW
324.0000 mg | CHEWABLE_TABLET | Freq: Once | ORAL | Status: AC
  Administered 2014-01-05: 324 mg via ORAL
  Filled 2014-01-05: qty 4

## 2014-01-05 MED ORDER — IOHEXOL 350 MG/ML SOLN
100.0000 mL | Freq: Once | INTRAVENOUS | Status: AC | PRN
  Administered 2014-01-05: 100 mL via INTRAVENOUS

## 2014-01-05 MED ORDER — HYDROCODONE-ACETAMINOPHEN 5-325 MG PO TABS: 1.0000 | ORAL_TABLET | Freq: Four times a day (QID) | ORAL | Status: DC | PRN

## 2014-01-05 MED ORDER — BENZONATATE 100 MG PO CAPS: 100.0000 mg | ORAL_CAPSULE | Freq: Three times a day (TID) | ORAL | Status: DC | PRN

## 2014-01-05 NOTE — ED Provider Notes (Signed)
CSN: [id number]     Arrival date & time 01/05/14  1328 History   First MD Initiated Contact with Patient 01/05/14 1348     Chief Complaint  Patient presents with  . Chest Pain     (Consider location/radiation/quality/duration/timing/severity/associated sxs/prior Treatment) HPI  63 year old female presents with chest pain, cough, and shortness of breath over the past 3 days. The pain is in the middle of her chest and worsens with inspiration. She has been using oxygen when necessary. Denies a leg swelling or leg pain. Denies any prior history of blood clots. The cough is productive. Hx shows CAD, she denies any prior MIs. Has not had chest pain like this before. Pain is sharp.    Past Medical History  Diagnosis Date  . Hypertension   . CAD (coronary artery disease), native coronary artery     a. Nonobstructive by cath 02/2012 (done because of low EF).  . Tobacco abuse   . Chronic combined systolic and diastolic CHF (congestive heart failure)     a. 03/05/12 echo:  LVEF 20-25%, moderate LVH , inferior and basal to mid septal akinesis, anterior moderate to severe hypokinesis and grade 2 diastolic dysfunction. b. EF 07/2012: EF still 25% (unclear medication compliance).  . Anemia     a. Noted on 07/2012 labs, instructed to f/u PCP.  Marland Kitchen LBBB (left bundle branch block)   . History of noncompliance with medical treatment   . High cholesterol   . Chronic bronchitis     "~ every other year" (11/18/2012)  . Orthopnea   . Type II diabetes mellitus   . Headache(784.0)     "often; maybe not daily" (11/18/2012)  . Arthritis     "joints" (11/18/2012)  . Chronic lower back pain   . Automatic implantable cardioverter-defibrillator in situ    Past Surgical History  Procedure Laterality Date  . Joint replacement      Bilateral hip and right knee  . Cardiac catheterization  03/04/12    nonobstructive CAD, elevated LVEDP and tortuous vessels suggestive of long-standing hypertension  . Bi-ventricular  implantable cardioverter defibrillator  (crt-d)  11/18/2012  . Left heart cath N/A 03/05/2012    Procedure: LEFT HEART CATH;  Surgeon: Larey Dresser, MD;  Location: [facility] CATH LAB;  Service: Cardiovascular;  Laterality: N/A;  . Bi-ventricular implantable cardioverter defibrillator N/A 11/18/2012    Procedure: BI-VENTRICULAR IMPLANTABLE CARDIOVERTER DEFIBRILLATOR  (CRT-D);  Surgeon: Evans Lance, MD;  Location: [facility] CATH LAB;  Service: Cardiovascular;  Laterality: N/A;   Family History  Problem Relation Age of Onset  . Other      Denies family history of CAD   History  Substance Use Topics  . Smoking status: Former Smoker -- 1.00 packs/day for 40 years    Types: Cigarettes    Start date: 06/23/1972    Quit date: 03/26/2012  . Smokeless tobacco: Never Used  . Alcohol Use: No   OB History    No data available     Review of Systems  Constitutional: Negative for fever.  HENT: Positive for congestion.   Respiratory: Positive for cough and shortness of breath.   Cardiovascular: Positive for chest pain. Negative for leg swelling.  Gastrointestinal: Negative for vomiting.  All other systems reviewed and are negative.     Allergies  Morphine and related  Home Medications   Prior to Admission medications   Medication Sig Start Date End Date Taking? Authorizing Provider  aspirin EC 81 MG tablet Take 81 mg by  mouth daily.    Historical Provider, MD  atorvastatin (LIPITOR) 20 MG tablet TAKE 1 TABLET BY MOUTH DAILY 07/06/13   Jolaine Artist, MD  carvedilol (COREG) 25 MG tablet Take 1 tablet (25 mg total) by mouth 2 (two) times daily with a meal. 06/14/13   Jolaine Artist, MD  enalapril (VASOTEC) 10 MG tablet TAKE 1 TABLET BY MOUTH TWICE DAILY    Jolaine Artist, MD  furosemide (LASIX) 40 MG tablet Take 1 tablet (40 mg total) by mouth 2 (two) times daily. 12/15/12   Rande Brunt, NP  hydrALAZINE (APRESOLINE) 50 MG tablet Take 1 tablet (50 mg total) by mouth 3 (three) times  daily. 04/25/13   Larey Dresser, MD  insulin glargine (LANTUS) 100 UNIT/ML injection Inject 40 Units into the skin at bedtime.     Historical Provider, MD  isosorbide mononitrate (IMDUR) 60 MG 24 hr tablet Take 1 tablet (60 mg total) by mouth daily. 04/25/13   Larey Dresser, MD  KLOR-CON 10 10 MEQ tablet TAKE 2 TABLETS BY MOUTH ONCE DAILY 01/02/13   Jolaine Artist, MD  metFORMIN (GLUCOPHAGE) 1000 MG tablet Take 1 tablet (1,000 mg total) by mouth 2 (two) times daily with a meal. HOLD for 2 days then resume on 11/21/2012 11/19/12   Brooke O Edmisten, PA-C  spironolactone (ALDACTONE) 25 MG tablet Take 25 mg by mouth daily.    Historical Provider, MD   BP 160/80 mmHg  Pulse 110  Temp(Src) 98.1 F (36.7 C) (Oral)  Resp 29  Wt 231 lb (104.781 kg)  SpO2 97% Physical Exam  Constitutional: She is oriented to person, place, and time. She appears well-developed and well-nourished.  HENT:  Head: Normocephalic and atraumatic.  Right Ear: External ear normal.  Left Ear: External ear normal.  Nose: Nose normal.  Eyes: Right eye exhibits no discharge. Left eye exhibits no discharge.  Cardiovascular: Normal rate, regular rhythm and normal heart sounds.   Pulmonary/Chest: Effort normal and breath sounds normal.  Abdominal: Soft. There is no tenderness.  Neurological: She is alert and oriented to person, place, and time.  Skin: Skin is warm and dry.  Vitals reviewed.   ED Course  Procedures (including critical care time) Labs Review Labs Reviewed  CBC - Abnormal; Notable for the following:    RBC 3.50 (*)    Hemoglobin 10.8 (*)    HCT 32.0 (*)    All other components within normal limits  COMPREHENSIVE METABOLIC PANEL - Abnormal; Notable for the following:    Glucose, Bld 253 (*)    Anion gap 16 (*)    All other components within normal limits  PRO B NATRIURETIC PEPTIDE - Abnormal; Notable for the following:    Pro B Natriuretic peptide (BNP) 818.5 (*)    All other components within  normal limits  D-DIMER, QUANTITATIVE - Abnormal; Notable for the following:    D-Dimer, Quant 0.60 (*)    All other components within normal limits  I-STAT TROPOININ, ED    Imaging Review Dg Chest 2 View  01/05/2014   CLINICAL DATA:  Chest pain.  EXAM: CHEST  2 VIEW  COMPARISON:  November 19, 2012.  FINDINGS: Stable cardiomediastinal silhouette. No pneumothorax or pleural effusion is noted. Left-sided pacemaker is unchanged in position. No acute pulmonary disease is noted. Bony thorax is intact.  IMPRESSION: No acute cardiopulmonary abnormality seen.   Electronically Signed   By: Sabino Dick M.D.   On: 01/05/2014 14:35  EKG Interpretation   Date/Time:  Thursday January 05 2014 13:41:33 EST Ventricular Rate:  153 PR Interval:    QRS Duration: 138 QT Interval:  390 QTC Calculation: 622 R Axis:   -47 Text Interpretation:  Ventricular-paced rhythm with Premature  supraventricular complexes and with occasional Premature ventricular  complexes Biventricular pacemaker detected Abnormal ECG Confirmed by  Jakolby Sedivy  MD, Mandel Seiden ([id number]) on 01/05/2014 3:43:54 PM      MDM   Final diagnoses:  Chest pain    Patient's symptoms are atypical and seem more c/w a URI causing chest pain. Doubt ACS. Given pleuritic pain, ddimer evaluated and is elevated, will get CTA to r/o PE. Has had constant chest pain for several days, with negative troponin I believe MI is ruled out. Care transferred to Dr. Maryan Rued with CT pending. If negative will d/c with f/u with PCP.    Ephraim Hamburger, MD 01/05/14 [phone]

## 2014-01-05 NOTE — ED Notes (Signed)
Pt

## 2014-01-05 NOTE — Discharge Instructions (Signed)

## 2014-01-05 NOTE — ED Provider Notes (Signed)
CT neg for PE.  Pt states she still feels ok.  Sent home with pain control and tessalon pearls.  Will f/u with Dr. Jonelle Sidle on Monday if symptoms persist  Blanchie Dessert, MD 01/05/14 [phone]

## 2014-01-05 NOTE — ED Notes (Signed)
Pt reports that she started having chest pain for the past couple of days but is worse today. Reports a cardiac hx. Also with SOB, and labored breathing. States she uses oxygen when needed.

## 2014-01-05 NOTE — ED Notes (Addendum)
Patient transported to CT 

## 2014-01-05 NOTE — ED Notes (Signed)
Dr. Goldston at bedside.  

## 2014-01-09 ENCOUNTER — Institutional Professional Consult (permissible substitution): Payer: Medicare Other | Admitting: Internal Medicine

## 2014-01-10 ENCOUNTER — Encounter: Payer: Self-pay | Admitting: Internal Medicine

## 2014-01-13 ENCOUNTER — Encounter (HOSPITAL_COMMUNITY): Payer: Medicare Other

## 2014-01-30 ENCOUNTER — Encounter (HOSPITAL_COMMUNITY): Payer: Medicare Other

## 2014-02-03 ENCOUNTER — Encounter (HOSPITAL_COMMUNITY): Payer: Self-pay | Admitting: *Deleted

## 2014-02-03 ENCOUNTER — Emergency Department (HOSPITAL_COMMUNITY): Payer: Medicare Other

## 2014-02-03 ENCOUNTER — Observation Stay (HOSPITAL_COMMUNITY)
Admission: EM | Admit: 2014-02-03 | Discharge: 2014-02-04 | Disposition: A | Discharge status: Home / Self Care | Payer: Medicare Other | Attending: Internal Medicine | Admitting: Internal Medicine

## 2014-02-03 DIAGNOSIS — Z794 Long term (current) use of insulin: Secondary | ICD-10-CM | POA: Insufficient documentation

## 2014-02-03 DIAGNOSIS — E1165 Type 2 diabetes mellitus with hyperglycemia: Secondary | ICD-10-CM | POA: Insufficient documentation

## 2014-02-03 DIAGNOSIS — R197 Diarrhea, unspecified: Secondary | ICD-10-CM | POA: Insufficient documentation

## 2014-02-03 DIAGNOSIS — R079 Chest pain, unspecified: Secondary | ICD-10-CM | POA: Insufficient documentation

## 2014-02-03 DIAGNOSIS — I429 Cardiomyopathy, unspecified: Secondary | ICD-10-CM

## 2014-02-03 DIAGNOSIS — J209 Acute bronchitis, unspecified: Secondary | ICD-10-CM | POA: Diagnosis not present

## 2014-02-03 DIAGNOSIS — R509 Fever, unspecified: Secondary | ICD-10-CM | POA: Diagnosis present

## 2014-02-03 DIAGNOSIS — Z950 Presence of cardiac pacemaker: Secondary | ICD-10-CM | POA: Insufficient documentation

## 2014-02-03 DIAGNOSIS — R9431 Abnormal electrocardiogram [ECG] [EKG]: Secondary | ICD-10-CM | POA: Insufficient documentation

## 2014-02-03 DIAGNOSIS — E860 Dehydration: Secondary | ICD-10-CM | POA: Insufficient documentation

## 2014-02-03 DIAGNOSIS — J111 Influenza due to unidentified influenza virus with other respiratory manifestations: Secondary | ICD-10-CM

## 2014-02-03 DIAGNOSIS — Z96643 Presence of artificial hip joint, bilateral: Secondary | ICD-10-CM | POA: Insufficient documentation

## 2014-02-03 DIAGNOSIS — M545 Low back pain: Secondary | ICD-10-CM | POA: Diagnosis not present

## 2014-02-03 DIAGNOSIS — R69 Illness, unspecified: Secondary | ICD-10-CM

## 2014-02-03 DIAGNOSIS — Z87891 Personal history of nicotine dependence: Secondary | ICD-10-CM | POA: Diagnosis not present

## 2014-02-03 DIAGNOSIS — R6510 Systemic inflammatory response syndrome (SIRS) of non-infectious origin without acute organ dysfunction: Secondary | ICD-10-CM | POA: Insufficient documentation

## 2014-02-03 DIAGNOSIS — I10 Essential (primary) hypertension: Secondary | ICD-10-CM | POA: Insufficient documentation

## 2014-02-03 DIAGNOSIS — E780 Pure hypercholesterolemia: Secondary | ICD-10-CM | POA: Diagnosis not present

## 2014-02-03 DIAGNOSIS — A419 Sepsis, unspecified organism: Secondary | ICD-10-CM

## 2014-02-03 DIAGNOSIS — G8929 Other chronic pain: Secondary | ICD-10-CM | POA: Diagnosis not present

## 2014-02-03 DIAGNOSIS — E119 Type 2 diabetes mellitus without complications: Secondary | ICD-10-CM

## 2014-02-03 DIAGNOSIS — R05 Cough: Secondary | ICD-10-CM | POA: Diagnosis present

## 2014-02-03 DIAGNOSIS — I5043 Acute on chronic combined systolic (congestive) and diastolic (congestive) heart failure: Secondary | ICD-10-CM | POA: Insufficient documentation

## 2014-02-03 DIAGNOSIS — I428 Other cardiomyopathies: Secondary | ICD-10-CM

## 2014-02-03 DIAGNOSIS — G4730 Sleep apnea, unspecified: Secondary | ICD-10-CM | POA: Diagnosis present

## 2014-02-03 DIAGNOSIS — I2510 Atherosclerotic heart disease of native coronary artery without angina pectoris: Secondary | ICD-10-CM | POA: Diagnosis not present

## 2014-02-03 DIAGNOSIS — I447 Left bundle-branch block, unspecified: Secondary | ICD-10-CM | POA: Insufficient documentation

## 2014-02-03 DIAGNOSIS — Z96651 Presence of right artificial knee joint: Secondary | ICD-10-CM | POA: Insufficient documentation

## 2014-02-03 DIAGNOSIS — R0602 Shortness of breath: Secondary | ICD-10-CM

## 2014-02-03 DIAGNOSIS — IMO0001 Reserved for inherently not codable concepts without codable children: Secondary | ICD-10-CM

## 2014-02-03 LAB — URINALYSIS, ROUTINE W REFLEX MICROSCOPIC
Bilirubin Urine: NEGATIVE
Glucose, UA: >1000 mg/dL — AB
Hgb urine dipstick: NEGATIVE
Ketones, ur: NEGATIVE mg/dL
Leukocytes, UA: NEGATIVE
Nitrite: NEGATIVE
Protein, ur: 30 mg/dL — AB
Specific Gravity, Urine: 1.032 — ABNORMAL HIGH (ref 1.005–1.030)
Urobilinogen, UA: 1 mg/dL (ref 0.0–1.0)
pH: 6 (ref 5.0–8.0)

## 2014-02-03 LAB — CBC WITH DIFFERENTIAL/PLATELET
Basophils Absolute: 0 10*3/uL (ref 0.0–0.1)
Basophils Relative: 0 % (ref 0–1)
Eosinophils Absolute: 0 10*3/uL (ref 0.0–0.7)
Eosinophils Relative: 0 % (ref 0–5)
HCT: 37.1 % (ref 36.0–46.0)
Hemoglobin: 12.3 g/dL (ref 12.0–15.0)
Lymphocytes Relative: 16 % (ref 12–46)
Lymphs Abs: 1.2 10*3/uL (ref 0.7–4.0)
MCH: 29.4 pg (ref 26.0–34.0)
MCHC: 33.2 g/dL (ref 30.0–36.0)
MCV: 88.8 fL (ref 78.0–100.0)
Monocytes Absolute: 0.7 10*3/uL (ref 0.1–1.0)
Monocytes Relative: 10 % (ref 3–12)
Neutro Abs: 5.6 10*3/uL (ref 1.7–7.7)
Neutrophils Relative %: 74 % (ref 43–77)
Platelets: 369 10*3/uL (ref 150–400)
RBC: 4.18 MIL/uL (ref 3.87–5.11)
RDW: 12.8 % (ref 11.5–15.5)
WBC: 7.6 10*3/uL (ref 4.0–10.5)

## 2014-02-03 LAB — URINE MICROSCOPIC-ADD ON

## 2014-02-03 LAB — INFLUENZA PANEL BY PCR (TYPE A & B)
H1N1 flu by pcr: NOT DETECTED
Influenza A By PCR: NEGATIVE
Influenza B By PCR: NEGATIVE

## 2014-02-03 LAB — GLUCOSE, CAPILLARY
Glucose-Capillary: 187 mg/dL — ABNORMAL HIGH (ref 70–99)
Glucose-Capillary: 174 mg/dL — ABNORMAL HIGH (ref 70–99)

## 2014-02-03 LAB — COMPREHENSIVE METABOLIC PANEL
ALT: 13 U/L (ref 0–35)
AST: 20 U/L (ref 0–37)
Albumin: 3.9 g/dL (ref 3.5–5.2)
Alkaline Phosphatase: 87 U/L (ref 39–117)
Anion gap: 11 (ref 5–15)
BUN: 8 mg/dL (ref 6–23)
CO2: 29 mmol/L (ref 19–32)
Calcium: 8.8 mg/dL (ref 8.4–10.5)
Chloride: 96 mEq/L (ref 96–112)
Creatinine, Ser: 0.92 mg/dL (ref 0.50–1.10)
GFR calc Af Amer: 76 mL/min — ABNORMAL LOW (ref >90)
GFR calc non Af Amer: 66 mL/min — ABNORMAL LOW (ref >90)
Glucose, Bld: 301 mg/dL — ABNORMAL HIGH (ref 70–99)
Potassium: 3.5 mmol/L (ref 3.5–5.1)
Sodium: 136 mmol/L (ref 135–145)
Total Bilirubin: 0.7 mg/dL (ref 0.3–1.2)
Total Protein: 7 g/dL (ref 6.0–8.3)

## 2014-02-03 LAB — I-STAT CG4 LACTIC ACID, ED
Lactic Acid, Venous: 2.48 mmol/L — ABNORMAL HIGH (ref 0.5–2.2)
Lactic Acid, Venous: 1.39 mmol/L (ref 0.5–2.2)

## 2014-02-03 LAB — I-STAT TROPONIN, ED: Troponin i, poc: 0.01 ng/mL (ref 0.00–0.08)

## 2014-02-03 MED ORDER — HYDROCODONE-ACETAMINOPHEN 5-325 MG PO TABS
2.0000 | ORAL_TABLET | Freq: Once | ORAL | Status: AC
  Administered 2014-02-03: 2 via ORAL
  Filled 2014-02-03: qty 2

## 2014-02-03 MED ORDER — INSULIN GLARGINE 100 UNIT/ML ~~LOC~~ SOLN
50.0000 [IU] | Freq: Every day | SUBCUTANEOUS | Status: DC
  Administered 2014-02-03: 50 [IU] via SUBCUTANEOUS
  Filled 2014-02-03 (×2): qty 0.5

## 2014-02-03 MED ORDER — ATORVASTATIN CALCIUM 40 MG PO TABS
40.0000 mg | ORAL_TABLET | Freq: Every day | ORAL | Status: DC
  Administered 2014-02-04: 40 mg via ORAL
  Filled 2014-02-03: qty 1

## 2014-02-03 MED ORDER — ALBUTEROL SULFATE HFA 108 (90 BASE) MCG/ACT IN AERS: 2.0000 | INHALATION_SPRAY | Freq: Four times a day (QID) | RESPIRATORY_TRACT | Status: DC | PRN

## 2014-02-03 MED ORDER — SODIUM CHLORIDE 0.9 % IV SOLN
INTRAVENOUS | Status: DC
  Administered 2014-02-03: 19:00:00 via INTRAVENOUS

## 2014-02-03 MED ORDER — ISOSORBIDE MONONITRATE ER 30 MG PO TB24
30.0000 mg | ORAL_TABLET | Freq: Every day | ORAL | Status: DC
  Administered 2014-02-04: 30 mg via ORAL
  Filled 2014-02-03: qty 1

## 2014-02-03 MED ORDER — ACETAMINOPHEN 325 MG PO TABS: 650.0000 mg | ORAL_TABLET | Freq: Four times a day (QID) | ORAL | Status: DC | PRN

## 2014-02-03 MED ORDER — ASPIRIN EC 81 MG PO TBEC
81.0000 mg | DELAYED_RELEASE_TABLET | Freq: Every day | ORAL | Status: DC
  Administered 2014-02-04: 81 mg via ORAL
  Filled 2014-02-03: qty 1

## 2014-02-03 MED ORDER — GABAPENTIN 300 MG PO CAPS
300.0000 mg | ORAL_CAPSULE | Freq: Two times a day (BID) | ORAL | Status: DC
  Administered 2014-02-03 – 2014-02-04 (×2): 300 mg via ORAL
  Filled 2014-02-03 (×3): qty 1

## 2014-02-03 MED ORDER — DEXTROSE 5 % IV SOLN
500.0000 mg | INTRAVENOUS | Status: DC
  Administered 2014-02-03: 500 mg via INTRAVENOUS
  Filled 2014-02-03 (×2): qty 500

## 2014-02-03 MED ORDER — CLONAZEPAM 0.5 MG PO TABS
0.5000 mg | ORAL_TABLET | Freq: Every day | ORAL | Status: DC | PRN
  Administered 2014-02-03: 0.5 mg via ORAL
  Filled 2014-02-03: qty 1

## 2014-02-03 MED ORDER — BENZONATATE 100 MG PO CAPS: 100.0000 mg | ORAL_CAPSULE | Freq: Three times a day (TID) | ORAL | Status: DC | PRN

## 2014-02-03 MED ORDER — ACETAMINOPHEN 650 MG RE SUPP: 650.0000 mg | Freq: Four times a day (QID) | RECTAL | Status: DC | PRN

## 2014-02-03 MED ORDER — INSULIN ASPART 100 UNIT/ML ~~LOC~~ SOLN
0.0000 [IU] | Freq: Three times a day (TID) | SUBCUTANEOUS | Status: DC
  Administered 2014-02-03: 2 [IU] via SUBCUTANEOUS
  Administered 2014-02-04: 3 [IU] via SUBCUTANEOUS

## 2014-02-03 MED ORDER — ALBUTEROL SULFATE (2.5 MG/3ML) 0.083% IN NEBU: 2.5000 mg | INHALATION_SOLUTION | RESPIRATORY_TRACT | Status: DC | PRN

## 2014-02-03 MED ORDER — OXYCODONE HCL 5 MG PO TABS: 5.0000 mg | ORAL_TABLET | ORAL | Status: DC | PRN

## 2014-02-03 MED ORDER — SODIUM CHLORIDE 0.9 % IV BOLUS (SEPSIS)
2000.0000 mL | Freq: Once | INTRAVENOUS | Status: AC
  Administered 2014-02-03: 2000 mL via INTRAVENOUS

## 2014-02-03 MED ORDER — ALUM & MAG HYDROXIDE-SIMETH 200-200-20 MG/5ML PO SUSP: 30.0000 mL | Freq: Four times a day (QID) | ORAL | Status: DC | PRN

## 2014-02-03 MED ORDER — ONDANSETRON HCL 4 MG PO TABS: 4.0000 mg | ORAL_TABLET | Freq: Four times a day (QID) | ORAL | Status: DC | PRN

## 2014-02-03 MED ORDER — HEPARIN SODIUM (PORCINE) 5000 UNIT/ML IJ SOLN
5000.0000 [IU] | Freq: Three times a day (TID) | INTRAMUSCULAR | Status: DC
  Administered 2014-02-03 – 2014-02-04 (×2): 5000 [IU] via SUBCUTANEOUS
  Filled 2014-02-03 (×5): qty 1

## 2014-02-03 MED ORDER — HYDRALAZINE HCL 50 MG PO TABS
50.0000 mg | ORAL_TABLET | Freq: Three times a day (TID) | ORAL | Status: DC
  Administered 2014-02-03 – 2014-02-04 (×2): 50 mg via ORAL
  Filled 2014-02-03 (×4): qty 1

## 2014-02-03 MED ORDER — ACETAMINOPHEN 325 MG PO TABS
650.0000 mg | ORAL_TABLET | Freq: Once | ORAL | Status: AC
  Administered 2014-02-03: 650 mg via ORAL
  Filled 2014-02-03: qty 2

## 2014-02-03 MED ORDER — ONDANSETRON HCL 4 MG/2ML IJ SOLN: 4.0000 mg | Freq: Four times a day (QID) | INTRAMUSCULAR | Status: DC | PRN

## 2014-02-03 NOTE — ED Notes (Signed)
Admitting at bedside 

## 2014-02-03 NOTE — ED Notes (Signed)
Dr. Jacubowitz at bedside 

## 2014-02-03 NOTE — ED Notes (Signed)
Patient states she has been sick for 2 weeks has not felt well.  She had onset of n/v/d for 2 days.  She is also having chest pain and headache.  She states she has had yellow productive cough.  She is sob at rest.  Patient has not seen her MD since her sx started.  She has taken her meds today.  Patient is on insulin but does not check her insulin regularly

## 2014-02-03 NOTE — ED Provider Notes (Signed)
CSN: [id number]     Arrival date & time 02/03/14  1247 History   First MD Initiated Contact with Patient 02/03/14 1343     Chief Complaint  Patient presents with  . Diarrhea  . Chest Pain  . Headache  . Emesis     (Consider location/radiation/quality/duration/timing/severity/associated sxs/prior Treatment) HPI complains of cough, nonproductive, shortness breath sore throat and frontal headache and diarrhea for the past 10 days. Last episode of diarrhea was yesterday. No blood per rectum. No nausea or vomiting. No abdominal pain she does admit to chest pain worse with coughing denies urinary symptoms she is treated herself with Tylenol, without relief. Other associated symptoms include lightheadedness with standing and subjective fever. Nothing makes symptoms better or worse  Past Medical History  Diagnosis Date  . Hypertension   . CAD (coronary artery disease), native coronary artery     a. Nonobstructive by cath 02/2012 (done because of low EF).  . Tobacco abuse   . Chronic combined systolic and diastolic CHF (congestive heart failure)     a. 03/05/12 echo:  LVEF 20-25%, moderate LVH , inferior and basal to mid septal akinesis, anterior moderate to severe hypokinesis and grade 2 diastolic dysfunction. b. EF 07/2012: EF still 25% (unclear medication compliance).  . Anemia     a. Noted on 07/2012 labs, instructed to f/u PCP.  Marland Kitchen LBBB (left bundle branch block)   . History of noncompliance with medical treatment   . High cholesterol   . Chronic bronchitis     "~ every other year" (11/18/2012)  . Orthopnea   . Type II diabetes mellitus   . Headache(784.0)     "often; maybe not daily" (11/18/2012)  . Arthritis     "joints" (11/18/2012)  . Chronic lower back pain   . Automatic implantable cardioverter-defibrillator in situ    Past Surgical History  Procedure Laterality Date  . Joint replacement      Bilateral hip and right knee  . Cardiac catheterization  03/04/12    nonobstructive CAD,  elevated LVEDP and tortuous vessels suggestive of long-standing hypertension  . Bi-ventricular implantable cardioverter defibrillator  (crt-d)  11/18/2012  . Left heart cath N/A 03/05/2012    Procedure: LEFT HEART CATH;  Surgeon: Larey Dresser, MD;  Location: [facility] CATH LAB;  Service: Cardiovascular;  Laterality: N/A;  . Bi-ventricular implantable cardioverter defibrillator N/A 11/18/2012    Procedure: BI-VENTRICULAR IMPLANTABLE CARDIOVERTER DEFIBRILLATOR  (CRT-D);  Surgeon: Evans Lance, MD;  Location: [facility] CATH LAB;  Service: Cardiovascular;  Laterality: N/A;   Family History  Problem Relation Age of Onset  . Other      Denies family history of CAD   History  Substance Use Topics  . Smoking status: Former Smoker -- 1.00 packs/day for 40 years    Types: Cigarettes    Start date: 06/23/1972    Quit date: 03/26/2012  . Smokeless tobacco: Never Used  . Alcohol Use: No   OB History    No data available     Review of Systems  Constitutional: Positive for fever.  HENT: Positive for sore throat.   Respiratory: Negative.   Cardiovascular: Negative.   Gastrointestinal: Positive for diarrhea.  Musculoskeletal: Negative.   Skin: Negative.   Neurological: Positive for weakness and headaches.  Psychiatric/Behavioral: Negative.   All other systems reviewed and are negative.     Allergies  Morphine and related  Home Medications   Prior to Admission medications   Medication Sig Start Date End Date Taking?  Authorizing Provider  albuterol (PROVENTIL HFA;VENTOLIN HFA) 108 (90 BASE) MCG/ACT inhaler Inhale 2 puffs into the lungs every 6 (six) hours as needed for wheezing or shortness of breath.   Yes Historical Provider, MD  aspirin EC 81 MG tablet Take 81 mg by mouth daily.   Yes Historical Provider, MD  atorvastatin (LIPITOR) 40 MG tablet Take 40 mg by mouth daily.  11/28/13  Yes Historical Provider, MD  benzonatate (TESSALON) 100 MG capsule Take 1 capsule (100 mg total) by mouth 3  (three) times daily as needed for cough. 01/05/14  Yes Blanchie Dessert, MD  furosemide (LASIX) 40 MG tablet Take 1 tablet (40 mg total) by mouth 2 (two) times daily. 12/15/12  Yes Rande Brunt, NP  gabapentin (NEURONTIN) 300 MG capsule Take 300 mg by mouth 2 (two) times daily.  11/28/13  Yes Historical Provider, MD  hydrALAZINE (APRESOLINE) 50 MG tablet Take 1 tablet (50 mg total) by mouth 3 (three) times daily. 04/25/13  Yes Larey Dresser, MD  HYDROcodone-acetaminophen (NORCO/VICODIN) 5-325 MG per tablet Take 1-2 tablets by mouth every 6 (six) hours as needed for moderate pain or severe pain. 01/05/14  Yes Blanchie Dessert, MD  Insulin Glargine (LANTUS SOLOSTAR) 100 UNIT/ML Solostar Pen Inject 50 Units into the skin at bedtime.   Yes Historical Provider, MD  isosorbide mononitrate (IMDUR) 30 MG 24 hr tablet Take 30 mg by mouth daily.  11/28/13  Yes Historical Provider, MD  metFORMIN (GLUCOPHAGE) 1000 MG tablet Take 1 tablet (1,000 mg total) by mouth 2 (two) times daily with a meal. HOLD for 2 days then resume on 11/21/2012 11/19/12  Yes Brooke O Edmisten, PA-C  naproxen sodium (ALEVE) 220 MG tablet Take 440 mg by mouth 2 (two) times daily as needed (pain).   Yes Historical Provider, MD  oxyCODONE-acetaminophen (PERCOCET) 10-325 MG per tablet Take 1 tablet by mouth every 4 (four) hours as needed for pain.  01/25/14  Yes Historical Provider, MD  spironolactone (ALDACTONE) 25 MG tablet Take 25 mg by mouth daily.   Yes Historical Provider, MD  atorvastatin (LIPITOR) 20 MG tablet TAKE 1 TABLET BY MOUTH DAILY Patient not taking: Reported on 02/03/2014 07/06/13   Jolaine Artist, MD  carvedilol (COREG) 25 MG tablet Take 1 tablet (25 mg total) by mouth 2 (two) times daily with a meal. Patient not taking: Reported on 02/03/2014 06/14/13   Jolaine Artist, MD  clonazePAM (KLONOPIN) 0.5 MG tablet Take 0.5 mg by mouth daily as needed for anxiety.  12/29/13   Historical Provider, MD  enalapril (VASOTEC) 10 MG  tablet TAKE 1 TABLET BY MOUTH TWICE DAILY Patient not taking: Reported on 02/03/2014    Jolaine Artist, MD  isosorbide mononitrate (IMDUR) 60 MG 24 hr tablet Take 1 tablet (60 mg total) by mouth daily. 04/25/13   Larey Dresser, MD  KLOR-CON 10 10 MEQ tablet TAKE 2 TABLETS BY MOUTH ONCE DAILY 01/02/13   Jolaine Artist, MD  naproxen sodium (ANAPROX) 220 MG tablet Take 220 mg by mouth as needed (headache).    Historical Provider, MD   BP 155/89 mmHg  Pulse 117  Temp(Src) 101.8 F (38.8 C) (Oral)  Resp 23  Ht 5\' 7"  (1.702 m)  SpO2 99% Physical Exam  Constitutional: She appears well-developed and well-nourished.  HENT:  Head: Normocephalic and atraumatic.  Mouth/Throat: No oropharyngeal exudate.  Mucous membranes dry  Eyes: Conjunctivae are normal. Pupils are equal, round, and reactive to light.  Neck: Neck supple. No  tracheal deviation present. No thyromegaly present.  Cardiovascular: Regular rhythm.   No murmur heard. Tachycardic  Pulmonary/Chest: Effort normal and breath sounds normal.  Abdominal: Soft. Bowel sounds are normal. She exhibits no distension. There is no tenderness.  Musculoskeletal: Normal range of motion. She exhibits no edema or tenderness.  Neurological: She is alert. Coordination normal.  Skin: Skin is warm and dry. No rash noted.  Psychiatric: She has a normal mood and affect.  Nursing note and vitals reviewed.   ED Course  Procedures (including critical care time) Labs Review Labs Reviewed  COMPREHENSIVE METABOLIC PANEL - Abnormal; Notable for the following:    Glucose, Bld 301 (*)    GFR calc non Af Amer 66 (*)    GFR calc Af Amer 76 (*)    All other components within normal limits  I-STAT CG4 LACTIC ACID, ED - Abnormal; Notable for the following:    Lactic Acid, Venous 2.48 (*)    All other components within normal limits  CULTURE, BLOOD (ROUTINE X 2)  CULTURE, BLOOD (ROUTINE X 2)  URINE CULTURE  CBC WITH DIFFERENTIAL  URINALYSIS, ROUTINE W  REFLEX MICROSCOPIC  INFLUENZA PANEL BY PCR (TYPE A & B, H1N1)  I-STAT TROPOININ, ED    Imaging Review Dg Chest 2 View  02/03/2014   CLINICAL DATA:  Two week history of chest pain with cough and congestion  EXAM: CHEST  2 VIEW  COMPARISON:  Chest radiograph January 05, 2014 and chest CT January 05, 2014  FINDINGS: Pacemaker leads are attached to the right atrium, right ventricle, and left ventricle. Heart is mildly enlarged with pulmonary vascularity within normal limits. There is no edema or consolidation. No adenopathy. No pneumothorax. There is degenerative change in the thoracic spine.  IMPRESSION: No edema or consolidation.   Electronically Signed   By: Lowella Grip M.D.   On: 02/03/2014 13:47     EKG Interpretation None     425 PM she feels improved after treatment with Norco and intravenous fluids Results for orders placed or performed during the hospital encounter of 02/03/14  CBC WITH DIFFERENTIAL  Result Value Ref Range   WBC 7.6 4.0 - 10.5 K/uL   RBC 4.18 3.87 - 5.11 MIL/uL   Hemoglobin 12.3 12.0 - 15.0 g/dL   HCT 37.1 36.0 - 46.0 %   MCV 88.8 78.0 - 100.0 fL   MCH 29.4 26.0 - 34.0 pg   MCHC 33.2 30.0 - 36.0 g/dL   RDW 12.8 11.5 - 15.5 %   Platelets 369 150 - 400 K/uL   Neutrophils Relative % 74 43 - 77 %   Neutro Abs 5.6 1.7 - 7.7 K/uL   Lymphocytes Relative 16 12 - 46 %   Lymphs Abs 1.2 0.7 - 4.0 K/uL   Monocytes Relative 10 3 - 12 %   Monocytes Absolute 0.7 0.1 - 1.0 K/uL   Eosinophils Relative 0 0 - 5 %   Eosinophils Absolute 0.0 0.0 - 0.7 K/uL   Basophils Relative 0 0 - 1 %   Basophils Absolute 0.0 0.0 - 0.1 K/uL  Comprehensive metabolic panel  Result Value Ref Range   Sodium 136 135 - 145 mmol/L   Potassium 3.5 3.5 - 5.1 mmol/L   Chloride 96 96 - 112 mEq/L   CO2 29 19 - 32 mmol/L   Glucose, Bld 301 (H) 70 - 99 mg/dL   BUN 8 6 - 23 mg/dL   Creatinine, Ser 0.92 0.50 - 1.10 mg/dL   Calcium  8.8 8.4 - 10.5 mg/dL   Total Protein 7.0 6.0 - 8.3 g/dL    Albumin 3.9 3.5 - 5.2 g/dL   AST 20 0 - 37 U/L   ALT 13 0 - 35 U/L   Alkaline Phosphatase 87 39 - 117 U/L   Total Bilirubin 0.7 0.3 - 1.2 mg/dL   GFR calc non Af Amer 66 (L) >90 mL/min   GFR calc Af Amer 76 (L) >90 mL/min   Anion gap 11 5 - 15  Urinalysis, Routine w reflex microscopic  Result Value Ref Range   Color, Urine AMBER (A) YELLOW   APPearance CLOUDY (A) CLEAR   Specific Gravity, Urine 1.032 (H) 1.005 - 1.030   pH 6.0 5.0 - 8.0   Glucose, UA >1000 (A) NEGATIVE mg/dL   Hgb urine dipstick NEGATIVE NEGATIVE   Bilirubin Urine NEGATIVE NEGATIVE   Ketones, ur NEGATIVE NEGATIVE mg/dL   Protein, ur 30 (A) NEGATIVE mg/dL   Urobilinogen, UA 1.0 0.0 - 1.0 mg/dL   Nitrite NEGATIVE NEGATIVE   Leukocytes, UA NEGATIVE NEGATIVE  Urine microscopic-add on  Result Value Ref Range   Squamous Epithelial / LPF MANY (A) RARE   WBC, UA 0-2 <3 WBC/hpf   RBC / HPF 0-2 <3 RBC/hpf   Bacteria, UA FEW (A) RARE   Urine-Other MUCOUS PRESENT   I-Stat CG4 Lactic Acid, ED  Result Value Ref Range   Lactic Acid, Venous 2.48 (H) 0.5 - 2.2 mmol/L  I-stat troponin, ED (not at [facility])  Result Value Ref Range   Troponin i, poc 0.01 0.00 - 0.08 ng/mL   Comment 3           Dg Chest 2 View  02/03/2014   CLINICAL DATA:  Two week history of chest pain with cough and congestion  EXAM: CHEST  2 VIEW  COMPARISON:  Chest radiograph January 05, 2014 and chest CT January 05, 2014  FINDINGS: Pacemaker leads are attached to the right atrium, right ventricle, and left ventricle. Heart is mildly enlarged with pulmonary vascularity within normal limits. There is no edema or consolidation. No adenopathy. No pneumothorax. There is degenerative change in the thoracic spine.  IMPRESSION: No edema or consolidation.   Electronically Signed   By: Lowella Grip M.D.   On: 02/03/2014 13:47   Dg Chest 2 View  01/05/2014   CLINICAL DATA:  Chest pain.  EXAM: CHEST  2 VIEW  COMPARISON:  November 19, 2012.  FINDINGS: Stable  cardiomediastinal silhouette. No pneumothorax or pleural effusion is noted. Left-sided pacemaker is unchanged in position. No acute pulmonary disease is noted. Bony thorax is intact.  IMPRESSION: No acute cardiopulmonary abnormality seen.   Electronically Signed   By: Sabino Dick M.D.   On: 01/05/2014 14:35   Ct Angio Chest Pe W/cm &/or Wo Cm  01/05/2014   CLINICAL DATA:  Chest pain and cough  EXAM: CT ANGIOGRAPHY CHEST WITH CONTRAST  TECHNIQUE: Multidetector CT imaging of the chest was performed using the standard protocol during bolus administration of intravenous contrast. Multiplanar CT image reconstructions and MIPs were obtained to evaluate the vascular anatomy.  CONTRAST:  171mL OMNIPAQUE IOHEXOL 350 MG/ML SOLN  COMPARISON:  Chest x-ray 01/05/2014  FINDINGS: Negative for pulmonary embolism.  Aorta is not adequately opacified to evaluate for dissection. No aortic dissection. Cardiac enlargement without pericardial effusion. Left ventricular enlargement.  Negative for mass or adenopathy.  Diffuse density throughout both lungs may represent pulmonary edema or chronic lung disease. Patchy densities were noted in  both lungs on the chest CT of 03/04/2012 which have largely cleared. Negative for pleural effusion.  Upper abdomen is negative.  Review of the MIP images confirms the above findings.  IMPRESSION: Negative for pulmonary embolism.  Hazy density throughout both lungs may represent interstitial edema or chronic lung disease.   Electronically Signed   By: Franchot Gallo M.D.   On: 01/05/2014 16:59    MDM  Patient exhibiting influenza-like illness. She is clinically dehydrated dehydrated. I spoke with Dr.Elgergawy plan 23 hour observation in this immunocompromised patient with febrile illness, hyperglycemia and dehydration Plan symptomatic control IV hydration cultures pending Final diagnoses:  SOB (shortness of breath)    Diagnosis #1 influenza-like illness #2 dehydration #3  hyperglycemia    Orlie Dakin, MD 02/03/14 1650

## 2014-02-03 NOTE — H&P (Addendum)
Patient Demographics  [name], is a 64 y.o. female  MRN: [id number]   DOB - [birth year]  Admit Date - 02/03/2014  Outpatient Primary MD for the patient is Barbette Merino, MD   With History of -  Past Medical History  Diagnosis Date  . Hypertension   . CAD (coronary artery disease), native coronary artery     a. Nonobstructive by cath 02/2012 (done because of low EF).  . Tobacco abuse   . Chronic combined systolic and diastolic CHF (congestive heart failure)     a. 03/05/12 echo:  LVEF 20-25%, moderate LVH , inferior and basal to mid septal akinesis, anterior moderate to severe hypokinesis and grade 2 diastolic dysfunction. b. EF 07/2012: EF still 25% (unclear medication compliance).  . Anemia     a. Noted on 07/2012 labs, instructed to f/u PCP.  Marland Kitchen LBBB (left bundle branch block)   . History of noncompliance with medical treatment   . High cholesterol   . Chronic bronchitis     "~ every other year" (11/18/2012)  . Orthopnea   . Type II diabetes mellitus   . Headache(784.0)     "often; maybe not daily" (11/18/2012)  . Arthritis     "joints" (11/18/2012)  . Chronic lower back pain   . Automatic implantable cardioverter-defibrillator in situ       Past Surgical History  Procedure Laterality Date  . Joint replacement      Bilateral hip and right knee  . Cardiac catheterization  03/04/12    nonobstructive CAD, elevated LVEDP and tortuous vessels suggestive of long-standing hypertension  . Bi-ventricular implantable cardioverter defibrillator  (crt-d)  11/18/2012  . Left heart cath N/A 03/05/2012    Procedure: LEFT HEART CATH;  Surgeon: Larey Dresser, MD;  Location: [facility] CATH LAB;  Service: Cardiovascular;  Laterality: N/A;  . Bi-ventricular implantable cardioverter defibrillator N/A 11/18/2012    Procedure: BI-VENTRICULAR IMPLANTABLE CARDIOVERTER DEFIBRILLATOR  (CRT-D);  Surgeon: Evans Lance, MD;  Location: [facility] CATH LAB;  Service: Cardiovascular;  Laterality: N/A;    in for    Chief Complaint  Patient presents with  . Diarrhea  . Chest Pain  . Headache  . Emesis     HPI  [name]  is a 64 y.o. female, with past medical history of coronary artery disease, nonischemic cardiomyopathy, diastolic/systolic CHF, presents with multiple complaints including generalized weakness, generalized body ache, cough, nonproductive sputum, sore throat, headache, reports chest pain while coughing, had no improvement of her symptoms, so she comes to ED today, patient was febrile 101.8, tachycardic, no leukocytosis, urinalysis was negative, chest x-ray does not show any opacity or infiltrate, given significant patient fever and tachycardia, hospitalist requested to admit the patient, and cultures were sent. As well patient was noticed to have hyperglycemia with blood glucose of 301 , agent is known to have history of diabetes mellitus .   Review of Systems    In addition to the HPI above,  reports fever and chills portsche, No changes with Vision or hearing, No problems swallowing food or Liquids, reportsest pain during cough ,  reports Cough  but no Shortness of Breath, No Abdominal pain, No Nausea or Vommittireports diarrhea for last 10 days, but improving over the last 48 hours . No Blood in stool or Urine, No dysuria, No new skin rashes or bruises, reports generalized body ache and joint pain. No new  Focal weakness, tingling, numbness in any extremity, No recent weight gain or loss, No  polyuria, polydypsia or polyphagia, No significant Mental Stressors.  A full 10 point Review of Systems was done, except as stated above, all other Review of Systems were negative.   Social History History  Substance Use Topics  . Smoking status: Former Smoker -- 1.00 packs/day for 40 years    Types: Cigarettes    Start date: 06/23/1972    Quit date: 03/26/2012  . Smokeless tobacco: Never Used  . Alcohol Use: No    Family History Family History  Problem Relation Age of  Onset  . Other      Denies family history of CAD    Prior to Admission medications   Medication Sig Start Date End Date Taking? Authorizing Provider  albuterol (PROVENTIL HFA;VENTOLIN HFA) 108 (90 BASE) MCG/ACT inhaler Inhale 2 puffs into the lungs every 6 (six) hours as needed for wheezing or shortness of breath.   Yes Historical Provider, MD  aspirin EC 81 MG tablet Take 81 mg by mouth daily.   Yes Historical Provider, MD  atorvastatin (LIPITOR) 40 MG tablet Take 40 mg by mouth daily.  11/28/13  Yes Historical Provider, MD  benzonatate (TESSALON) 100 MG capsule Take 1 capsule (100 mg total) by mouth 3 (three) times daily as needed for cough. 01/05/14  Yes Blanchie Dessert, MD  furosemide (LASIX) 40 MG tablet Take 1 tablet (40 mg total) by mouth 2 (two) times daily. 12/15/12  Yes Rande Brunt, NP  gabapentin (NEURONTIN) 300 MG capsule Take 300 mg by mouth 2 (two) times daily.  11/28/13  Yes Historical Provider, MD  hydrALAZINE (APRESOLINE) 50 MG tablet Take 1 tablet (50 mg total) by mouth 3 (three) times daily. 04/25/13  Yes Larey Dresser, MD  HYDROcodone-acetaminophen (NORCO/VICODIN) 5-325 MG per tablet Take 1-2 tablets by mouth every 6 (six) hours as needed for moderate pain or severe pain. 01/05/14  Yes Blanchie Dessert, MD  Insulin Glargine (LANTUS SOLOSTAR) 100 UNIT/ML Solostar Pen Inject 50 Units into the skin at bedtime.   Yes Historical Provider, MD  isosorbide mononitrate (IMDUR) 30 MG 24 hr tablet Take 30 mg by mouth daily.  11/28/13  Yes Historical Provider, MD  metFORMIN (GLUCOPHAGE) 1000 MG tablet Take 1 tablet (1,000 mg total) by mouth 2 (two) times daily with a meal. HOLD for 2 days then resume on 11/21/2012 11/19/12  Yes Brooke O Edmisten, PA-C  naproxen sodium (ALEVE) 220 MG tablet Take 440 mg by mouth 2 (two) times daily as needed (pain).   Yes Historical Provider, MD  oxyCODONE-acetaminophen (PERCOCET) 10-325 MG per tablet Take 1 tablet by mouth every 4 (four) hours as needed  for pain.  01/25/14  Yes Historical Provider, MD  spironolactone (ALDACTONE) 25 MG tablet Take 25 mg by mouth daily.   Yes Historical Provider, MD  clonazePAM (KLONOPIN) 0.5 MG tablet Take 0.5 mg by mouth daily as needed for anxiety.  12/29/13   Historical Provider, MD    Allergies  Allergen Reactions  . Morphine And Related Nausea And Vomiting    Severe nausea    Physical Exam  Vitals  Blood pressure 162/86, pulse 103, temperature 98.3 F (36.8 C), temperature source Oral, resp. rate 20, height 5\' 7"  (1.702 m), weight 100.925 kg (222 lb 8 oz), SpO2 96 %.   1. General well-nourished female laying in bed in mild discomfort.  2.  normal affect and insight, Not Suicidal or Homicidal, Awake Alert, Oriented X 3.  3. No F.N deficits, ALL C.Nerves Intact, Strength 5/5 all 4 extremities, Sensation  intact all 4 extremities, Plantars down going.  4. Ears and Eyes appear Normal, Conjunctivae clear, PERRLA. dry Oral Mucosa, mild pharyngeal erythema,  5. Supple Neck, No JVD, No Carotid Bruits. mild lymphadenopathy with tenderness to palpation in the neck area .  6. Symmetrical Chest wall movement, Good air movement bilaterally, CTAB.  7. RRR, No Gallops, Rubs or Murmurs, No Parasternal Heave.  8. Positive Bowel Sounds, Abdomen Soft, No tenderness, No organomegaly appriciated,No rebound -guarding or rigidity.  9.  No Cyanosis, Normal Skin Turgor, No Skin Rash or Bruise.  10. Good muscle tone,  joints appear normal , no effusions, Normal ROM.  11. No Palpable Lymph Nodes in Neck or Axillae    Data Review  CBC  Recent Labs Lab 02/03/14 1320  WBC 7.6  HGB 12.3  HCT 37.1  PLT 369  MCV 88.8  MCH 29.4  MCHC 33.2  RDW 12.8  LYMPHSABS 1.2  MONOABS 0.7  EOSABS 0.0  BASOSABS 0.0   ------------------------------------------------------------------------------------------------------------------  Chemistries   Recent Labs Lab 02/03/14 1320  NA 136  K 3.5  CL 96  CO2 29   GLUCOSE 301*  BUN 8  CREATININE 0.92  CALCIUM 8.8  AST 20  ALT 13  ALKPHOS 87  BILITOT 0.7   ------------------------------------------------------------------------------------------------------------------ estimated creatinine clearance is 78.4 mL/min (by C-G formula based on Cr of 0.92). ------------------------------------------------------------------------------------------------------------------ No results for input(s): TSH, T4TOTAL, T3FREE, THYROIDAB in the last 72 hours.  Invalid input(s): FREET3   Coagulation profile No results for input(s): INR, PROTIME in the last 168 hours. ------------------------------------------------------------------------------------------------------------------- No results for input(s): DDIMER in the last 72 hours. -------------------------------------------------------------------------------------------------------------------  Cardiac Enzymes No results for input(s): CKMB, TROPONINI, MYOGLOBIN in the last 168 hours.  Invalid input(s): CK ------------------------------------------------------------------------------------------------------------------ Invalid input(s): POCBNP   ---------------------------------------------------------------------------------------------------------------  Urinalysis    Component Value Date/Time   COLORURINE AMBER* 02/03/2014 1526   APPEARANCEUR CLOUDY* 02/03/2014 1526   LABSPEC 1.032* 02/03/2014 1526   PHURINE 6.0 02/03/2014 1526   GLUCOSEU >1000* 02/03/2014 1526   HGBUR NEGATIVE 02/03/2014 1526   BILIRUBINUR NEGATIVE 02/03/2014 1526   KETONESUR NEGATIVE 02/03/2014 1526   PROTEINUR 30* 02/03/2014 1526   UROBILINOGEN 1.0 02/03/2014 1526   NITRITE NEGATIVE 02/03/2014 1526   LEUKOCYTESUR NEGATIVE 02/03/2014 1526    ----------------------------------------------------------------------------------------------------------------  Imaging results:   Dg Chest 2 View  02/03/2014   CLINICAL DATA:   Two week history of chest pain with cough and congestion  EXAM: CHEST  2 VIEW  COMPARISON:  Chest radiograph January 05, 2014 and chest CT January 05, 2014  FINDINGS: Pacemaker leads are attached to the right atrium, right ventricle, and left ventricle. Heart is mildly enlarged with pulmonary vascularity within normal limits. There is no edema or consolidation. No adenopathy. No pneumothorax. There is degenerative change in the thoracic spine.  IMPRESSION: No edema or consolidation.   Electronically Signed   By: Lowella Grip M.D.   On: 02/03/2014 13:47    My personal review of EKG: showing paced rhythm Assessment & Plan  Active Problems:   Bronchitis   Sleep apnea   Nonischemic cardiomyopathy   CAD (coronary artery disease), native coronary artery   Acute on chronic combined systolic and diastolic heart failure   SIRS (systemic inflammatory response syndrome)   DM (diabetes mellitus)    SIRS  - She presents with fever, tachycardia, this is most likely related to viral syndrome , issues with symptoms including generalized body ache, cough, diarrhea . - We'll continue with IV fluids , Tylenol , pain  medicine as needed , will hydrate gently given her known history of CHF . - We'll check respiratory virus panel . - As well patient possibly having pharyngitis on physical exam , will check strep throat , will start on azithromycin for possible pharyngitis and upper respiratory infection . - Follow on blood cultures   Diarrhea - As likely related to viral illness, will check C. Difficile, GI pathogen panel. - Appears to be improving as per patient.  diabetes mellitus with hyperglycemia  - Uncontrolled , likely related to infection , will resume patient back on Lantus , we'll hold her metformin , will add insulin sliding scale .  Chest pain  - Nontypical , related to cough , negative troponin 1 .  History of coronary artery disease  - Continue with aspirin, statin,   nonischemic  cardiomyopathy , systolic/diastolic CHF  - Most recent echo in 5/15  showing EF 45-50%  - Patient appears to be dehydrated right now , will hold Lasix , and ALDACTONE ,  continuewith gentle IV hydration , and monitor closely for volume overload .    DVT Prophylaxis Heparin -   AM Labs Ordered, also please review Full Orders  Family Communication: Admission, patients condition and plan of care including tests being ordered have been discussed with the patient who indicate understanding and agree with the plan and Code Status.  Code Status full   Likely DC to  home   Condition GUARDED    Time spent in minutes : 55 minutes    Catalea Labrecque M.D on 02/03/2014 at 6:01 PM  Between 7am to 7pm - Pager - [phone]  After 7pm go to [email or web address] - password [email or web address]  And look for the night coverage person covering me after hours  [facility] Office  [phone]   **Disclaimer: This note may have been dictated with voice recognition software. Similar sounding words can inadvertently be transcribed and this note may contain transcription errors which may not have been corrected upon publication of note.**

## 2014-02-04 DIAGNOSIS — J209 Acute bronchitis, unspecified: Principal | ICD-10-CM

## 2014-02-04 DIAGNOSIS — G4730 Sleep apnea, unspecified: Secondary | ICD-10-CM

## 2014-02-04 LAB — URINE CULTURE: Colony Count: >=100000

## 2014-02-04 LAB — CBC
HCT: 33.5 % — ABNORMAL LOW (ref 36.0–46.0)
Hemoglobin: 10.9 g/dL — ABNORMAL LOW (ref 12.0–15.0)
MCH: 29.1 pg (ref 26.0–34.0)
MCHC: 32.5 g/dL (ref 30.0–36.0)
MCV: 89.3 fL (ref 78.0–100.0)
Platelets: 338 10*3/uL (ref 150–400)
RBC: 3.75 MIL/uL — ABNORMAL LOW (ref 3.87–5.11)
RDW: 12.9 % (ref 11.5–15.5)
WBC: 7.2 10*3/uL (ref 4.0–10.5)

## 2014-02-04 LAB — BASIC METABOLIC PANEL
Anion gap: 6 (ref 5–15)
BUN: 7 mg/dL (ref 6–23)
CO2: 28 mmol/L (ref 19–32)
Calcium: 8.3 mg/dL — ABNORMAL LOW (ref 8.4–10.5)
Chloride: 101 mEq/L (ref 96–112)
Creatinine, Ser: 0.63 mg/dL (ref 0.50–1.10)
GFR calc Af Amer: >90 mL/min (ref >90)
GFR calc non Af Amer: >90 mL/min (ref >90)
Glucose, Bld: 197 mg/dL — ABNORMAL HIGH (ref 70–99)
Potassium: 3.3 mmol/L — ABNORMAL LOW (ref 3.5–5.1)
Sodium: 135 mmol/L (ref 135–145)

## 2014-02-04 MED ORDER — FUROSEMIDE 40 MG PO TABS: 40.0000 mg | ORAL_TABLET | Freq: Two times a day (BID) | ORAL | Status: DC

## 2014-02-04 MED ORDER — POTASSIUM CHLORIDE CRYS ER 20 MEQ PO TBCR
40.0000 meq | EXTENDED_RELEASE_TABLET | Freq: Once | ORAL | Status: AC
  Administered 2014-02-04: 40 meq via ORAL
  Filled 2014-02-04: qty 2

## 2014-02-04 MED ORDER — AZITHROMYCIN 250 MG PO TABS: 250.0000 mg | ORAL_TABLET | Freq: Every day | ORAL | Status: DC

## 2014-02-04 NOTE — Discharge Summary (Signed)
Physician Discharge Summary  [name] IWL:[id number] DOB: January 26, 1951 DOA: 02/03/2014  PCP: Barbette Merino, MD  Admit date: 02/03/2014 Discharge date: 02/04/2014  Time spent: 45 minutes  Recommendations for Outpatient Follow-up:  1. Advised to see PCP in 2-3 days if symptoms do no improve   Discharge Condition: stable Diet recommendation: heart healthy, diabetic diet  Discharge Diagnoses:  Principal Problem:   Acute bronchitis Active Problems:   Sleep apnea   Nonischemic cardiomyopathy   CAD (coronary artery disease), native coronary artery   Acute on chronic combined systolic and diastolic heart failure   SIRS (systemic inflammatory response syndrome)   DM (diabetes mellitus)   History of present illness:  [name] is a 64 y.o. female, with past medical history of coronary artery disease, nonischemic cardiomyopathy, diastolic/systolic CHF, presents with multiple complaints including generalized weakness, generalized body ache, cough, nonproductive sputum, sore throat, headache, reports chest pain while coughing, had no improvement of her symptoms, so she comes to ED today, patient was febrile 101.8, tachycardic, no leukocytosis, urinalysis was negative, chest x-ray does not show any opacity or infiltrate, given significant patient fever and tachycardia, hospitalist requested to admit the patient, and cultures were sent. As well patient was noticed to have hyperglycemia with blood glucose of 301 , agent is known to have history of diabetes mellitus .  Hospital Course:  SIRS  - She presents with fever, tachycardia, generalized body ache, cough, diarrhea which appears to have resolved quickly after a dose of Azithromycin- will continue this for 4 more doses.  - influenza PCR negative -she is advised to continue Tylenol for fevers  Diarrhea - As likely related to above illness and has resolved. Last BM was yesterday.   diabetes mellitus with hyperglycemia  - Uncontrolled ,  likely related to infection - cont home doses of insulin  Chest pain  - Nontypical , related to cough , negative troponin 1 .  History of coronary artery disease  - Continue with aspirin, statin,   nonischemic cardiomyopathy , systolic/diastolic CHF  - Most recent echo in 5/15 showing EF 45-50%  - Patient appears to be dehydrated right now , will recommend she continue hold Lasix for today and resume tomorrow   Procedures:  none  Consultations:  none  Discharge Exam: Filed Weights   02/03/14 1756  Weight: 100.925 kg (222 lb 8 oz)   Filed Vitals:   02/03/14 2201  BP: 165/92  Pulse: 106  Temp: 98.3 F (36.8 C)  Resp: 20    General: AAO x 3, no distress Cardiovascular: RRR, no murmurs  Respiratory: clear to auscultation bilaterally GI: soft, non-tender, non-distended, bowel sound positive  Discharge Instructions You were cared for by a hospitalist during your hospital stay. If you have any questions about your discharge medications or the care you received while you were in the hospital after you are discharged, you can call the unit and asked to speak with the hospitalist on call if the hospitalist that took care of you is not available. Once you are discharged, your primary care physician will handle any further medical issues. Please note that NO REFILLS for any discharge medications will be authorized once you are discharged, as it is imperative that you return to your primary care physician (or establish a relationship with a primary care physician if you do not have one) for your aftercare needs so that they can reassess your need for medications and monitor your lab values.      Discharge Instructions  Diet - low sodium heart healthy    Complete by:  As directed   Diabetic diet     Increase activity slowly    Complete by:  As directed             Medication List    STOP taking these medications        oxyCODONE-acetaminophen 10-325 MG per tablet   Commonly known as:  PERCOCET      TAKE these medications        albuterol 108 (90 BASE) MCG/ACT inhaler  Commonly known as:  PROVENTIL HFA;VENTOLIN HFA  Inhale 2 puffs into the lungs every 6 (six) hours as needed for wheezing or shortness of breath.     ALEVE 220 MG tablet  Generic drug:  naproxen sodium  Take 440 mg by mouth 2 (two) times daily as needed (pain).     aspirin EC 81 MG tablet  Take 81 mg by mouth daily.     atorvastatin 40 MG tablet  Commonly known as:  LIPITOR  Take 40 mg by mouth daily.     benzonatate 100 MG capsule  Commonly known as:  TESSALON  Take 1 capsule (100 mg total) by mouth 3 (three) times daily as needed for cough.     clonazePAM 0.5 MG tablet  Commonly known as:  KLONOPIN  Take 0.5 mg by mouth daily as needed for anxiety.     furosemide 40 MG tablet  Commonly known as:  LASIX  Take 1 tablet (40 mg total) by mouth 2 (two) times daily.  Start taking on:  02/05/2014     gabapentin 300 MG capsule  Commonly known as:  NEURONTIN  Take 300 mg by mouth 2 (two) times daily.     hydrALAZINE 50 MG tablet  Commonly known as:  APRESOLINE  Take 1 tablet (50 mg total) by mouth 3 (three) times daily.     HYDROcodone-acetaminophen 5-325 MG per tablet  Commonly known as:  NORCO/VICODIN  Take 1-2 tablets by mouth every 6 (six) hours as needed for moderate pain or severe pain.     isosorbide mononitrate 30 MG 24 hr tablet  Commonly known as:  IMDUR  Take 30 mg by mouth daily.     LANTUS SOLOSTAR 100 UNIT/ML Solostar Pen  Generic drug:  Insulin Glargine  Inject 50 Units into the skin at bedtime.     metFORMIN 1000 MG tablet  Commonly known as:  GLUCOPHAGE  Take 1 tablet (1,000 mg total) by mouth 2 (two) times daily with a meal. HOLD for 2 days then resume on 11/21/2012     spironolactone 25 MG tablet  Commonly known as:  ALDACTONE  Take 25 mg by mouth daily.       Allergies  Allergen Reactions  . Morphine And Related Nausea And Vomiting     Severe nausea      The results of significant diagnostics from this hospitalization (including imaging, microbiology, ancillary and laboratory) are listed below for reference.    Significant Diagnostic Studies: Dg Chest 2 View  02/03/2014   CLINICAL DATA:  Two week history of chest pain with cough and congestion  EXAM: CHEST  2 VIEW  COMPARISON:  Chest radiograph January 05, 2014 and chest CT January 05, 2014  FINDINGS: Pacemaker leads are attached to the right atrium, right ventricle, and left ventricle. Heart is mildly enlarged with pulmonary vascularity within normal limits. There is no edema or consolidation. No adenopathy. No pneumothorax. There is degenerative change  in the thoracic spine.  IMPRESSION: No edema or consolidation.   Electronically Signed   By: Lowella Grip M.D.   On: 02/03/2014 13:47   Dg Chest 2 View  01/05/2014   CLINICAL DATA:  Chest pain.  EXAM: CHEST  2 VIEW  COMPARISON:  November 19, 2012.  FINDINGS: Stable cardiomediastinal silhouette. No pneumothorax or pleural effusion is noted. Left-sided pacemaker is unchanged in position. No acute pulmonary disease is noted. Bony thorax is intact.  IMPRESSION: No acute cardiopulmonary abnormality seen.   Electronically Signed   By: Sabino Dick M.D.   On: 01/05/2014 14:35   Ct Angio Chest Pe W/cm &/or Wo Cm  01/05/2014   CLINICAL DATA:  Chest pain and cough  EXAM: CT ANGIOGRAPHY CHEST WITH CONTRAST  TECHNIQUE: Multidetector CT imaging of the chest was performed using the standard protocol during bolus administration of intravenous contrast. Multiplanar CT image reconstructions and MIPs were obtained to evaluate the vascular anatomy.  CONTRAST:  164mL OMNIPAQUE IOHEXOL 350 MG/ML SOLN  COMPARISON:  Chest x-ray 01/05/2014  FINDINGS: Negative for pulmonary embolism.  Aorta is not adequately opacified to evaluate for dissection. No aortic dissection. Cardiac enlargement without pericardial effusion. Left ventricular enlargement.   Negative for mass or adenopathy.  Diffuse density throughout both lungs may represent pulmonary edema or chronic lung disease. Patchy densities were noted in both lungs on the chest CT of 03/04/2012 which have largely cleared. Negative for pleural effusion.  Upper abdomen is negative.  Review of the MIP images confirms the above findings.  IMPRESSION: Negative for pulmonary embolism.  Hazy density throughout both lungs may represent interstitial edema or chronic lung disease.   Electronically Signed   By: Franchot Gallo M.D.   On: 01/05/2014 16:59    Microbiology: No results found for this or any previous visit (from the past 240 hour(s)).   Labs: Basic Metabolic Panel:  Recent Labs Lab 02/03/14 1320 02/04/14 0341  NA 136 135  K 3.5 3.3*  CL 96 101  CO2 29 28  GLUCOSE 301* 197*  BUN 8 7  CREATININE 0.92 0.63  CALCIUM 8.8 8.3*   Liver Function Tests:  Recent Labs Lab 02/03/14 1320  AST 20  ALT 13  ALKPHOS 87  BILITOT 0.7  PROT 7.0  ALBUMIN 3.9   No results for input(s): LIPASE, AMYLASE in the last 168 hours. No results for input(s): AMMONIA in the last 168 hours. CBC:  Recent Labs Lab 02/03/14 1320 02/04/14 0341  WBC 7.6 7.2  NEUTROABS 5.6  --   HGB 12.3 10.9*  HCT 37.1 33.5*  MCV 88.8 89.3  PLT 369 338   Cardiac Enzymes: No results for input(s): CKTOTAL, CKMB, CKMBINDEX, TROPONINI in the last 168 hours. BNP: BNP (last 3 results)  Recent Labs  04/25/13 1219 12/29/13 1422 01/05/14 1404  PROBNP 88.9 812.3* 818.5*   CBG:  Recent Labs Lab 02/03/14 1839 02/03/14 2159  GLUCAP 187* 174*       SignedDebbe Odea, MD [facility] 02/04/2014, 10:25 AM

## 2014-02-04 NOTE — Progress Notes (Signed)
Utilization Review completed.  

## 2014-02-06 LAB — GLUCOSE, CAPILLARY: Glucose-Capillary: 236 mg/dL — ABNORMAL HIGH (ref 70–99)

## 2014-02-09 LAB — CULTURE, BLOOD (ROUTINE X 2)
Culture: NO GROWTH
Culture: NO GROWTH

## 2014-07-12 ENCOUNTER — Encounter (HOSPITAL_COMMUNITY): Payer: Self-pay | Admitting: Emergency Medicine

## 2014-07-12 ENCOUNTER — Inpatient Hospital Stay (HOSPITAL_COMMUNITY)
Admission: EM | Admit: 2014-07-12 | Discharge: 2014-07-15 | DRG: 292 | Disposition: A | Discharge status: Home / Self Care | Payer: Medicare Other | Attending: Cardiology | Admitting: Cardiology

## 2014-07-12 ENCOUNTER — Emergency Department (HOSPITAL_COMMUNITY): Payer: Medicare Other

## 2014-07-12 DIAGNOSIS — I2510 Atherosclerotic heart disease of native coronary artery without angina pectoris: Secondary | ICD-10-CM | POA: Diagnosis present

## 2014-07-12 DIAGNOSIS — E785 Hyperlipidemia, unspecified: Secondary | ICD-10-CM | POA: Diagnosis present

## 2014-07-12 DIAGNOSIS — Z9114 Patient's other noncompliance with medication regimen: Secondary | ICD-10-CM | POA: Diagnosis present

## 2014-07-12 DIAGNOSIS — J449 Chronic obstructive pulmonary disease, unspecified: Secondary | ICD-10-CM | POA: Diagnosis present

## 2014-07-12 DIAGNOSIS — E119 Type 2 diabetes mellitus without complications: Secondary | ICD-10-CM | POA: Diagnosis present

## 2014-07-12 DIAGNOSIS — Z87891 Personal history of nicotine dependence: Secondary | ICD-10-CM

## 2014-07-12 DIAGNOSIS — I472 Ventricular tachycardia: Secondary | ICD-10-CM | POA: Diagnosis present

## 2014-07-12 DIAGNOSIS — I5043 Acute on chronic combined systolic (congestive) and diastolic (congestive) heart failure: Secondary | ICD-10-CM | POA: Diagnosis not present

## 2014-07-12 DIAGNOSIS — Z96651 Presence of right artificial knee joint: Secondary | ICD-10-CM | POA: Diagnosis present

## 2014-07-12 DIAGNOSIS — I5023 Acute on chronic systolic (congestive) heart failure: Secondary | ICD-10-CM

## 2014-07-12 DIAGNOSIS — I5021 Acute systolic (congestive) heart failure: Secondary | ICD-10-CM

## 2014-07-12 DIAGNOSIS — I480 Paroxysmal atrial fibrillation: Secondary | ICD-10-CM | POA: Diagnosis present

## 2014-07-12 DIAGNOSIS — I429 Cardiomyopathy, unspecified: Secondary | ICD-10-CM | POA: Diagnosis present

## 2014-07-12 DIAGNOSIS — Z7982 Long term (current) use of aspirin: Secondary | ICD-10-CM

## 2014-07-12 DIAGNOSIS — Z794 Long term (current) use of insulin: Secondary | ICD-10-CM | POA: Diagnosis not present

## 2014-07-12 DIAGNOSIS — Z95810 Presence of automatic (implantable) cardiac defibrillator: Secondary | ICD-10-CM

## 2014-07-12 DIAGNOSIS — Z96643 Presence of artificial hip joint, bilateral: Secondary | ICD-10-CM | POA: Diagnosis present

## 2014-07-12 DIAGNOSIS — I10 Essential (primary) hypertension: Secondary | ICD-10-CM | POA: Diagnosis present

## 2014-07-12 DIAGNOSIS — I509 Heart failure, unspecified: Secondary | ICD-10-CM | POA: Diagnosis not present

## 2014-07-12 LAB — I-STAT TROPONIN, ED: Troponin i, poc: 0 ng/mL (ref 0.00–0.08)

## 2014-07-12 LAB — GLUCOSE, CAPILLARY: Glucose-Capillary: 328 mg/dL — ABNORMAL HIGH (ref 65–99)

## 2014-07-12 LAB — CBC WITH DIFFERENTIAL/PLATELET
Basophils Absolute: 0 10*3/uL (ref 0.0–0.1)
Basophils Relative: 0 % (ref 0–1)
Eosinophils Absolute: 0.1 10*3/uL (ref 0.0–0.7)
Eosinophils Relative: 2 % (ref 0–5)
HCT: 32.9 % — ABNORMAL LOW (ref 36.0–46.0)
Hemoglobin: 11.2 g/dL — ABNORMAL LOW (ref 12.0–15.0)
Lymphocytes Relative: 20 % (ref 12–46)
Lymphs Abs: 1.5 10*3/uL (ref 0.7–4.0)
MCH: 30.5 pg (ref 26.0–34.0)
MCHC: 34 g/dL (ref 30.0–36.0)
MCV: 89.6 fL (ref 78.0–100.0)
Monocytes Absolute: 0.5 10*3/uL (ref 0.1–1.0)
Monocytes Relative: 6 % (ref 3–12)
Neutro Abs: 5.6 10*3/uL (ref 1.7–7.7)
Neutrophils Relative %: 72 % (ref 43–77)
Platelets: 377 10*3/uL (ref 150–400)
RBC: 3.67 MIL/uL — ABNORMAL LOW (ref 3.87–5.11)
RDW: 13.3 % (ref 11.5–15.5)
WBC: 7.8 10*3/uL (ref 4.0–10.5)

## 2014-07-12 LAB — BASIC METABOLIC PANEL
Anion gap: 8 (ref 5–15)
BUN: 12 mg/dL (ref 6–20)
CO2: 28 mmol/L (ref 22–32)
Calcium: 9.3 mg/dL (ref 8.9–10.3)
Chloride: 102 mmol/L (ref 101–111)
Creatinine, Ser: 0.85 mg/dL (ref 0.44–1.00)
GFR calc Af Amer: >60 mL/min (ref >60)
GFR calc non Af Amer: >60 mL/min (ref >60)
Glucose, Bld: 305 mg/dL — ABNORMAL HIGH (ref 65–99)
Potassium: 3.4 mmol/L — ABNORMAL LOW (ref 3.5–5.1)
Sodium: 138 mmol/L (ref 135–145)

## 2014-07-12 LAB — BRAIN NATRIURETIC PEPTIDE: B Natriuretic Peptide: 492.5 pg/mL — ABNORMAL HIGH (ref 0.0–100.0)

## 2014-07-12 MED ORDER — SODIUM CHLORIDE 0.9 % IJ SOLN: 3.0000 mL | INTRAMUSCULAR | Status: DC | PRN

## 2014-07-12 MED ORDER — HEPARIN SODIUM (PORCINE) 5000 UNIT/ML IJ SOLN
5000.0000 [IU] | Freq: Three times a day (TID) | INTRAMUSCULAR | Status: DC
  Administered 2014-07-12 – 2014-07-13 (×2): 5000 [IU] via SUBCUTANEOUS
  Filled 2014-07-12 (×5): qty 1

## 2014-07-12 MED ORDER — GABAPENTIN 300 MG PO CAPS
300.0000 mg | ORAL_CAPSULE | Freq: Two times a day (BID) | ORAL | Status: DC
  Administered 2014-07-12 – 2014-07-15 (×6): 300 mg via ORAL
  Filled 2014-07-12 (×7): qty 1

## 2014-07-12 MED ORDER — ENALAPRIL MALEATE 5 MG PO TABS
5.0000 mg | ORAL_TABLET | Freq: Two times a day (BID) | ORAL | Status: DC
  Administered 2014-07-12: 5 mg via ORAL
  Filled 2014-07-12 (×3): qty 1

## 2014-07-12 MED ORDER — HYDRALAZINE HCL 50 MG PO TABS
50.0000 mg | ORAL_TABLET | Freq: Three times a day (TID) | ORAL | Status: DC
  Administered 2014-07-12: 50 mg via ORAL
  Filled 2014-07-12 (×4): qty 1

## 2014-07-12 MED ORDER — INSULIN GLARGINE 100 UNIT/ML ~~LOC~~ SOLN
50.0000 [IU] | Freq: Every day | SUBCUTANEOUS | Status: DC
  Administered 2014-07-12 – 2014-07-14 (×3): 50 [IU] via SUBCUTANEOUS
  Filled 2014-07-12 (×4): qty 0.5

## 2014-07-12 MED ORDER — ATORVASTATIN CALCIUM 40 MG PO TABS
40.0000 mg | ORAL_TABLET | Freq: Every day | ORAL | Status: DC
  Administered 2014-07-13 – 2014-07-15 (×3): 40 mg via ORAL
  Filled 2014-07-12 (×3): qty 1

## 2014-07-12 MED ORDER — ASPIRIN EC 81 MG PO TBEC
81.0000 mg | DELAYED_RELEASE_TABLET | Freq: Every day | ORAL | Status: DC
  Administered 2014-07-13: 81 mg via ORAL
  Filled 2014-07-12: qty 1

## 2014-07-12 MED ORDER — FUROSEMIDE 10 MG/ML IJ SOLN
80.0000 mg | Freq: Two times a day (BID) | INTRAMUSCULAR | Status: DC
  Administered 2014-07-12 – 2014-07-15 (×6): 80 mg via INTRAVENOUS
  Filled 2014-07-12 (×9): qty 8

## 2014-07-12 MED ORDER — ALBUTEROL SULFATE (2.5 MG/3ML) 0.083% IN NEBU: 2.5000 mg | INHALATION_SOLUTION | Freq: Four times a day (QID) | RESPIRATORY_TRACT | Status: DC | PRN

## 2014-07-12 MED ORDER — FUROSEMIDE 10 MG/ML IJ SOLN
80.0000 mg | Freq: Once | INTRAMUSCULAR | Status: AC
  Administered 2014-07-12: 80 mg via INTRAVENOUS
  Filled 2014-07-12: qty 8

## 2014-07-12 MED ORDER — NITROGLYCERIN 0.4 MG SL SUBL: 0.4000 mg | SUBLINGUAL_TABLET | SUBLINGUAL | Status: DC | PRN

## 2014-07-12 MED ORDER — SPIRONOLACTONE 25 MG PO TABS
25.0000 mg | ORAL_TABLET | Freq: Every day | ORAL | Status: DC
  Administered 2014-07-13 – 2014-07-15 (×3): 25 mg via ORAL
  Filled 2014-07-12 (×3): qty 1

## 2014-07-12 MED ORDER — ACETAMINOPHEN 325 MG PO TABS: 650.0000 mg | ORAL_TABLET | ORAL | Status: DC | PRN

## 2014-07-12 MED ORDER — SODIUM CHLORIDE 0.9 % IJ SOLN
3.0000 mL | Freq: Two times a day (BID) | INTRAMUSCULAR | Status: DC
  Administered 2014-07-12 – 2014-07-15 (×5): 3 mL via INTRAVENOUS

## 2014-07-12 MED ORDER — SODIUM CHLORIDE 0.9 % IV SOLN: 250.0000 mL | INTRAVENOUS | Status: DC | PRN

## 2014-07-12 MED ORDER — NAPROXEN 250 MG PO TABS
500.0000 mg | ORAL_TABLET | Freq: Two times a day (BID) | ORAL | Status: DC | PRN
  Filled 2014-07-12: qty 2

## 2014-07-12 MED ORDER — HYDROCODONE-ACETAMINOPHEN 5-325 MG PO TABS: 1.0000 | ORAL_TABLET | Freq: Four times a day (QID) | ORAL | Status: DC | PRN

## 2014-07-12 MED ORDER — INSULIN ASPART 100 UNIT/ML ~~LOC~~ SOLN
0.0000 [IU] | Freq: Three times a day (TID) | SUBCUTANEOUS | Status: DC
  Administered 2014-07-13: 2 [IU] via SUBCUTANEOUS
  Administered 2014-07-13: 5 [IU] via SUBCUTANEOUS
  Administered 2014-07-13 – 2014-07-14 (×3): 3 [IU] via SUBCUTANEOUS
  Administered 2014-07-14: 11 [IU] via SUBCUTANEOUS
  Administered 2014-07-15: 3 [IU] via SUBCUTANEOUS

## 2014-07-12 MED ORDER — ONDANSETRON HCL 4 MG/2ML IJ SOLN: 4.0000 mg | Freq: Four times a day (QID) | INTRAMUSCULAR | Status: DC | PRN

## 2014-07-12 MED ORDER — NITROGLYCERIN IN D5W 200-5 MCG/ML-% IV SOLN
0.0000 ug/min | INTRAVENOUS | Status: DC
  Administered 2014-07-12: 5 ug/min via INTRAVENOUS
  Administered 2014-07-13 (×2): 140 ug/min via INTRAVENOUS
  Administered 2014-07-13: 25 ug/min via INTRAVENOUS
  Filled 2014-07-12 (×4): qty 250

## 2014-07-12 MED ORDER — POTASSIUM CHLORIDE CRYS ER 20 MEQ PO TBCR
40.0000 meq | EXTENDED_RELEASE_TABLET | Freq: Two times a day (BID) | ORAL | Status: DC
  Administered 2014-07-12 – 2014-07-15 (×6): 40 meq via ORAL
  Filled 2014-07-12 (×8): qty 2

## 2014-07-12 NOTE — ED Notes (Signed)
Pt ambulated to restroom and back.

## 2014-07-12 NOTE — ED Notes (Signed)
Pt ambulated to restroom. 

## 2014-07-12 NOTE — ED Notes (Signed)
Dr. Eulas Post advised he did not want to try SL nitro to go ahead and start iv nitro

## 2014-07-12 NOTE — ED Provider Notes (Signed)
CSN: [id number]     Arrival date & time 07/12/14  1335 History   First MD Initiated Contact with Patient 07/12/14 1602     Chief Complaint  Patient presents with  . Shortness of Breath  . Leg Swelling     (Consider location/radiation/quality/duration/timing/severity/associated sxs/prior Treatment) HPI Comments: Patient presents with shortness of breath and leg swelling. She has a history of CHF hyperlipidemia and hypertension as well as diabetes. She is followed by the CHF clinic. She states she's been compliant with her medications. She states over the last 2 weeks she's had increased shortness of breath and lower extremity swelling bilaterally. She states she's been taking her diuretics normally although she ran out of her Lasix yesterday. She's had some intermittent chest pain to the center of her chest. The last time she had chest pain was about 2 days ago. She does report increased shortness of breath and orthopnea. She denies any fevers. She's had a nonproductive cough. She denies any nausea or vomiting.  Patient is a 64 y.o. female presenting with shortness of breath.  Shortness of Breath Associated symptoms: chest pain   Associated symptoms: no abdominal pain, no cough, no diaphoresis, no fever, no headaches, no rash and no vomiting     Past Medical History  Diagnosis Date  . Hypertension   . CAD (coronary artery disease), native coronary artery     a. Nonobstructive by cath 02/2012 (done because of low EF).  . Tobacco abuse   . Chronic combined systolic and diastolic CHF (congestive heart failure)     a. 03/05/12 echo:  LVEF 20-25%, moderate LVH , inferior and basal to mid septal akinesis, anterior moderate to severe hypokinesis and grade 2 diastolic dysfunction. b. EF 07/2012: EF still 25% (unclear medication compliance).  . Anemia     a. Noted on 07/2012 labs, instructed to f/u PCP.  Marland Kitchen LBBB (left bundle branch block)   . History of noncompliance with medical treatment   . High  cholesterol   . Chronic bronchitis     "~ every other year" (11/18/2012)  . Orthopnea   . Type II diabetes mellitus   . Headache(784.0)     "often; maybe not daily" (11/18/2012)  . Arthritis     "joints" (11/18/2012)  . Chronic lower back pain   . Automatic implantable cardioverter-defibrillator in situ    Past Surgical History  Procedure Laterality Date  . Joint replacement      Bilateral hip and right knee  . Cardiac catheterization  03/04/12    nonobstructive CAD, elevated LVEDP and tortuous vessels suggestive of long-standing hypertension  . Bi-ventricular implantable cardioverter defibrillator  (crt-d)  11/18/2012  . Left heart cath N/A 03/05/2012    Procedure: LEFT HEART CATH;  Surgeon: Larey Dresser, MD;  Location: [facility] CATH LAB;  Service: Cardiovascular;  Laterality: N/A;  . Bi-ventricular implantable cardioverter defibrillator N/A 11/18/2012    Procedure: BI-VENTRICULAR IMPLANTABLE CARDIOVERTER DEFIBRILLATOR  (CRT-D);  Surgeon: Evans Lance, MD;  Location: [facility] CATH LAB;  Service: Cardiovascular;  Laterality: N/A;   Family History  Problem Relation Age of Onset  . Other      Denies family history of CAD   History  Substance Use Topics  . Smoking status: Former Smoker -- 1.00 packs/day for 40 years    Types: Cigarettes    Start date: 06/23/1972    Quit date: 03/26/2012  . Smokeless tobacco: Never Used  . Alcohol Use: No   OB History    No  data available     Review of Systems  Constitutional: Negative for fever, chills, diaphoresis and fatigue.  HENT: Negative for congestion, rhinorrhea and sneezing.   Eyes: Negative.   Respiratory: Positive for shortness of breath. Negative for cough and chest tightness.   Cardiovascular: Positive for chest pain and leg swelling.  Gastrointestinal: Negative for nausea, vomiting, abdominal pain, diarrhea and blood in stool.  Genitourinary: Negative for frequency, hematuria, flank pain and difficulty urinating.  Musculoskeletal:  Negative for back pain and arthralgias.  Skin: Negative for rash.  Neurological: Negative for dizziness, speech difficulty, weakness, numbness and headaches.      Allergies  Morphine and related  Home Medications   Prior to Admission medications   Medication Sig Start Date End Date Taking? Authorizing Provider  albuterol (PROVENTIL HFA;VENTOLIN HFA) 108 (90 BASE) MCG/ACT inhaler Inhale 2 puffs into the lungs every 6 (six) hours as needed for wheezing or shortness of breath.   Yes Historical Provider, MD  aspirin EC 81 MG tablet Take 81 mg by mouth daily.   Yes Historical Provider, MD  atorvastatin (LIPITOR) 40 MG tablet Take 40 mg by mouth daily.  11/28/13  Yes Historical Provider, MD  carvedilol (COREG) 12.5 MG tablet Take 12.5 mg by mouth 2 (two) times daily. 06/17/14  Yes Historical Provider, MD  enalapril (VASOTEC) 10 MG tablet Take 10 mg by mouth 2 (two) times daily. 06/17/14  Yes Historical Provider, MD  furosemide (LASIX) 40 MG tablet Take 1 tablet (40 mg total) by mouth 2 (two) times daily. 02/05/14  Yes Debbe Odea, MD  hydrALAZINE (APRESOLINE) 50 MG tablet Take 1 tablet (50 mg total) by mouth 3 (three) times daily. 04/25/13  Yes Larey Dresser, MD  HYDROcodone-acetaminophen [facility]) 10-325 MG per tablet Take 2 tablets by mouth every 6 (six) hours as needed for moderate pain.  05/26/14  Yes Historical Provider, MD  Insulin Glargine (LANTUS SOLOSTAR) 100 UNIT/ML Solostar Pen Inject 50 Units into the skin at bedtime.   Yes Historical Provider, MD  isosorbide mononitrate (IMDUR) 30 MG 24 hr tablet Take 30 mg by mouth daily.  11/28/13  Yes Historical Provider, MD  metFORMIN (GLUCOPHAGE) 1000 MG tablet Take 1 tablet (1,000 mg total) by mouth 2 (two) times daily with a meal. HOLD for 2 days then resume on 11/21/2012 Patient taking differently: Take 1,000 mg by mouth 2 (two) times daily with a meal.  11/19/12  Yes Brooke O Edmisten, PA-C  naproxen sodium (ALEVE) 220 MG tablet Take 440 mg by  mouth 2 (two) times daily as needed (pain).   Yes Historical Provider, MD  rizatriptan (MAXALT) 5 MG tablet Take 5 mg by mouth 3 (three) times daily. 06/17/14  Yes Historical Provider, MD   BP 175/75 mmHg  Pulse 85  Temp(Src) 98.1 F (36.7 C) (Oral)  Resp 32  Ht 5\' 7"  (1.702 m)  Wt 229 lb 4.5 oz (104 kg)  BMI 35.90 kg/m2  SpO2 94% Physical Exam  Constitutional: She is oriented to person, place, and time. She appears well-developed and well-nourished.  HENT:  Head: Normocephalic and atraumatic.  Eyes: Pupils are equal, round, and reactive to light.  Neck: Normal range of motion. Neck supple.  Cardiovascular: Normal rate, regular rhythm and normal heart sounds.   Pulmonary/Chest: Effort normal and breath sounds normal. No respiratory distress. She has no wheezes. She has no rales. She exhibits no tenderness.  Abdominal: Soft. Bowel sounds are normal. There is no tenderness. There is no rebound and no guarding.  Musculoskeletal: Normal range of motion. She exhibits edema.  2+ pitting edema bilaterally  Lymphadenopathy:    She has no cervical adenopathy.  Neurological: She is alert and oriented to person, place, and time.  Skin: Skin is warm and dry. No rash noted.  Psychiatric: She has a normal mood and affect.    ED Course  Procedures (including critical care time) Labs Review Labs Reviewed  BASIC METABOLIC PANEL - Abnormal; Notable for the following:    Potassium 3.4 (*)    Glucose, Bld 305 (*)    All other components within normal limits  BRAIN NATRIURETIC PEPTIDE - Abnormal; Notable for the following:    B Natriuretic Peptide 492.5 (*)    All other components within normal limits  CBC WITH DIFFERENTIAL/PLATELET - Abnormal; Notable for the following:    RBC 3.67 (*)    Hemoglobin 11.2 (*)    HCT 32.9 (*)    All other components within normal limits  GLUCOSE, CAPILLARY - Abnormal; Notable for the following:    Glucose-Capillary 328 (*)    All other components within  normal limits  BASIC METABOLIC PANEL  I-STAT TROPOININ, ED    Imaging Review Dg Chest 2 View  07/12/2014   CLINICAL DATA:  SOB x 1 week.  Upper left CP x 1 day.  EXAM: CHEST  2 VIEW  COMPARISON:  02/03/2014  FINDINGS: There is no focal parenchymal opacity. There is no pleural effusion or pneumothorax. There is stable cardiomegaly. There is a 3-lead AICD.  The osseous structures are unremarkable.  IMPRESSION: No active cardiopulmonary disease.   Electronically Signed   By: Kathreen Devoid   On: 07/12/2014 14:25     EKG Interpretation   Date/Time:  Wednesday July 12 2014 13:41:22 EDT Ventricular Rate:  92 PR Interval:  170 QRS Duration: 110 QT Interval:  418 QTC Calculation: 516 R Axis:   51 Text Interpretation:  Atrial-sensed ventricular-paced rhythm Abnormal ECG  Confirmed by Raffi Milstein  MD, Amir Fick ([id number]) on 07/12/2014 4:20:48 PM      MDM   Final diagnoses:  Acute systolic congestive heart failure    Patient presents with increased shortness of breath and orthopnea. There is no pulmonary edema on chest x-ray. She has no ischemic changes on EKG although it is a paced rhythm. Her troponin is negative. I spoke with Dr. Algernon Huxley with cardiology will see the patient. She was given 80 mg of Lasix and nitroglycerin in the ED.    Malvin Johns, MD 07/13/14 [phone]

## 2014-07-12 NOTE — ED Notes (Signed)
Pt sts CP/SOB and leg swelling x 1 week getting more severe; pt sts hx of CHF

## 2014-07-12 NOTE — Progress Notes (Addendum)
eLink Physician-Brief Progress Note Patient Name: [name] DOB: 1950/12/24 MRN: [id number]   Date of Service  07/12/2014  HPI/Events of Note  64 yo female with PMH of HTN, DM, COPD, LBBB and non-ischemic cardiomyopathy with LVEF = 20% to 25%. Admitted to ICU with CHF exacerbation after running out of Lasix. Current management includes Lasix, Spironolactone, Lipitor, Vaasotec, Hydralazine, Lantus, Novolog and Albuterol. Management per Cardiology.   eICU Interventions  Continue current management.      Intervention Category Evaluation Type: New Patient Evaluation  Lysle Dingwall 07/12/2014, 10:06 PM

## 2014-07-12 NOTE — H&P (Signed)
Physician History and Physical    PRINCETTA UPLINGER MRN: [id number] DOB/AGE: 07-27-1950 64 y.o. Admit date: 07/12/2014 PCP: Dr Jonelle Sidle Primary Cardiologist: Dr Aundra Dubin (CHF)  HPI:  64 yo with history of nonischemic cardiomyopathy presented today with acute on chronic systolic CHF. She was admitted with CHF exacerbation in 02/2012. EF 20-25% on echo, LHC with nonobstructive CAD. She was started on cardiac meds and discharged. In 07/2012, she was admitted again with CHF exacerbation. She had run out of Lasix. She was taking her other heart medications as ordered, however. She was diuresed and discharged. She had a chronic LBBB, and Medtronic CRT-D device was placed in 10/14. Repeat echo in 04/2013 showed that EF now 45-50%.  Patient was last seen in CHF clinic in 12/15. Since that time, she has been lost to followup.  She has not been taking Coreg or enalapril for a long time.  She has been taking hydralazine, Imdur, and spironolactone.  She ran out of Lasix several days ago.  She has gradually developed increased lower extremity swelling.  She has become progressively more short of breath, now dyspneic walking around her house.  She has orthopnea.  No chest pain. No lightheadedness.  She came to the ER because of dyspnea.  BP was markedly elevated with BP > 200/100.    PMH: 1. HTN 2. Type II diabetes 3. Nonischemic cardiomyopathy: ? Due to HTN versus LBBB CMP. 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 Echo (2/14) with EF 20-25%. Echo (7/14) with EF 25%, diffuse hypokinesis. HIV, SPEP, UPEP negative. Has LBBB. CRT-D 10/2012 (Medtronic). Echo (4/15) with EF 45-50%, mild diffuse hypokinesis, PA systolic pressure 38 mmHg.  4. Chronic LBBB 5. Right TKR 6. Bilateral THR.  7. Hyperlipidemia 8. ?COPD: Has oxygen for use with exertion.   SH: Prior smoker, quit 2/14. Never drank ETOH. No drugs. Lives with son.   FH: Mother with "heart trouble."   ROS: All systems reviewed and negative except as  per HPI.   Current Facility-Administered Medications  Medication Dose Route Frequency Provider Last Rate Last Dose  . nitroGLYCERIN (NITROSTAT) SL tablet 0.4 mg  0.4 mg Sublingual Q5 min PRN Malvin Johns, MD       Current Outpatient Prescriptions  Medication Sig Dispense Refill  . albuterol (PROVENTIL HFA;VENTOLIN HFA) 108 (90 BASE) MCG/ACT inhaler Inhale 2 puffs into the lungs every 6 (six) hours as needed for wheezing or shortness of breath.    Marland Kitchen atorvastatin (LIPITOR) 40 MG tablet Take 40 mg by mouth daily.   2  . aspirin EC 81 MG tablet Take 81 mg by mouth daily.    Marland Kitchen azithromycin (ZITHROMAX) 250 MG tablet Take 1 tablet (250 mg total) by mouth daily. 4 each 0  . benzonatate (TESSALON) 100 MG capsule Take 1 capsule (100 mg total) by mouth 3 (three) times daily as needed for cough. 21 capsule 0  . furosemide (LASIX) 40 MG tablet Take 1 tablet (40 mg total) by mouth 2 (two) times daily. 60 tablet 6  . gabapentin (NEURONTIN) 300 MG capsule Take 300 mg by mouth 2 (two) times daily.   2  . hydrALAZINE (APRESOLINE) 50 MG tablet Take 1 tablet (50 mg total) by mouth 3 (three) times daily. 90 tablet 3  . HYDROcodone-acetaminophen (NORCO/VICODIN) 5-325 MG per tablet Take 1-2 tablets by mouth every 6 (six) hours as needed for moderate pain or severe pain. 15 tablet 0  . Insulin Glargine (LANTUS SOLOSTAR) 100 UNIT/ML Solostar Pen Inject 50 Units into the  skin at bedtime.    . isosorbide mononitrate (IMDUR) 30 MG 24 hr tablet Take 30 mg by mouth daily.   2  . metFORMIN (GLUCOPHAGE) 1000 MG tablet Take 1 tablet (1,000 mg total) by mouth 2 (two) times daily with a meal. HOLD for 2 days then resume on 11/21/2012    . naproxen sodium (ALEVE) 220 MG tablet Take 440 mg by mouth 2 (two) times daily as needed (pain).    Marland Kitchen spironolactone (ALDACTONE) 25 MG tablet Take 25 mg by mouth daily.      Physical Exam: Blood pressure 215/109, pulse 90, temperature 98.1 F (36.7 C), temperature source Oral, resp. rate  20, height 5\' 7"  (1.702 m), weight 238 lb (107.956 kg), SpO2 97 %.  General: NAD Neck: JVP 12 cm, no thyromegaly or thyroid nodule.  Lungs: Slight crackles at bases bilaterally CV: Lateral PMI.  Heart regular S1/S2, no S3/S4, no murmur.  1+ edema to knees bilaterally.  No carotid bruit.  Normal pedal pulses.  Abdomen: Soft, nontender, no hepatosplenomegaly, mild distention.  Skin: Intact without lesions or rashes.  Neurologic: Alert and oriented x 3.  Psych: Normal affect. Extremities: No clubbing or cyanosis.  HEENT: Normal.   Labs:   Lab Results  Component Value Date   WBC 7.8 07/12/2014   HGB 11.2* 07/12/2014   HCT 32.9* 07/12/2014   MCV 89.6 07/12/2014   PLT 377 07/12/2014    Recent Labs Lab 07/12/14 1347  NA 138  K 3.4*  CL 102  CO2 28  BUN 12  CREATININE 0.85  CALCIUM 9.3  GLUCOSE 305*   Radiology: - CXR: no acute findings  EKG: NSR,BiV paced  ASSESSMENT AND PLAN: 64 yo with history of nonischemic cardiomyopathy presented today with hypertensive emergency and acute on chronic systolic CHF. 1. Acute on chronic systolic CHF: Nonischemic cardiomyopathy.  EF had improved to 45-50% on last echo, but I am concerned that it has worsened again.  She has Medtronic CRT-D device.  She is markedly volume overloaded on exam.  She has been off Coreg and enalapril long-term, she has been off Lasix for around a week or so.  NYHA class IV symptoms currently.  - Start Lasix 80 mg IV bid and KCl 40 mEq bid.  - NTG gtt to control BP - Hydralazine 50 mg tid.  - Restart enalapril at 5 mg bid, can eventually transition over to [place].  - Hold off on Coreg until volume is more stable (she has not been taking recently).  - Continue spironolactone 25 mg daily.  - Repeat echo.  - Will have Medtronic interrogate device while she's here.  2. HTN: Hypertensive emergency with CHF.  I will use NTG gtt acutely, titrate to keep SBP < 160 for now.  Will eventually transition over to Bidil.  3.  Diabetes: Cover with sliding scale insulin for now.    Signed: [name] 07/12/2014, 6:12 PM

## 2014-07-12 NOTE — ED Notes (Addendum)
Pt reports she ran out of lasix on Tuesday but did take bp meds today, sob x 2 weeks. resp equal, minimally labored.

## 2014-07-13 ENCOUNTER — Inpatient Hospital Stay (HOSPITAL_COMMUNITY): Payer: Medicare Other

## 2014-07-13 ENCOUNTER — Other Ambulatory Visit (HOSPITAL_COMMUNITY): Payer: Medicare Other

## 2014-07-13 DIAGNOSIS — I5043 Acute on chronic combined systolic (congestive) and diastolic (congestive) heart failure: Principal | ICD-10-CM

## 2014-07-13 DIAGNOSIS — I509 Heart failure, unspecified: Secondary | ICD-10-CM

## 2014-07-13 DIAGNOSIS — I480 Paroxysmal atrial fibrillation: Secondary | ICD-10-CM

## 2014-07-13 LAB — GLUCOSE, CAPILLARY
Glucose-Capillary: 130 mg/dL — ABNORMAL HIGH (ref 65–99)
Glucose-Capillary: 169 mg/dL — ABNORMAL HIGH (ref 65–99)
Glucose-Capillary: 206 mg/dL — ABNORMAL HIGH (ref 65–99)

## 2014-07-13 LAB — BASIC METABOLIC PANEL
Anion gap: 10 (ref 5–15)
BUN: 10 mg/dL (ref 6–20)
CO2: 34 mmol/L — ABNORMAL HIGH (ref 22–32)
Calcium: 9.3 mg/dL (ref 8.9–10.3)
Chloride: 97 mmol/L — ABNORMAL LOW (ref 101–111)
Creatinine, Ser: 0.68 mg/dL (ref 0.44–1.00)
GFR calc Af Amer: >60 mL/min (ref >60)
GFR calc non Af Amer: >60 mL/min (ref >60)
Glucose, Bld: 230 mg/dL — ABNORMAL HIGH (ref 65–99)
Potassium: 3 mmol/L — ABNORMAL LOW (ref 3.5–5.1)
Sodium: 141 mmol/L (ref 135–145)

## 2014-07-13 LAB — MAGNESIUM: Magnesium: 1.4 mg/dL — ABNORMAL LOW (ref 1.7–2.4)

## 2014-07-13 MED ORDER — LOSARTAN POTASSIUM 25 MG PO TABS
25.0000 mg | ORAL_TABLET | Freq: Two times a day (BID) | ORAL | Status: DC
  Administered 2014-07-13 – 2014-07-15 (×5): 25 mg via ORAL
  Filled 2014-07-13 (×6): qty 1

## 2014-07-13 MED ORDER — APIXABAN 5 MG PO TABS
5.0000 mg | ORAL_TABLET | Freq: Two times a day (BID) | ORAL | Status: DC
  Administered 2014-07-13 – 2014-07-15 (×5): 5 mg via ORAL
  Filled 2014-07-13 (×6): qty 1

## 2014-07-13 MED ORDER — DEXTROSE 5 % IV SOLN
3.0000 g | Freq: Once | INTRAVENOUS | Status: DC
  Filled 2014-07-13: qty 6

## 2014-07-13 MED ORDER — CARVEDILOL 3.125 MG PO TABS
3.1250 mg | ORAL_TABLET | Freq: Two times a day (BID) | ORAL | Status: DC
  Administered 2014-07-13 – 2014-07-14 (×2): 3.125 mg via ORAL
  Filled 2014-07-13 (×4): qty 1

## 2014-07-13 MED ORDER — SPIRONOLACTONE 12.5 MG HALF TABLET
12.5000 mg | ORAL_TABLET | Freq: Every day | ORAL | Status: DC
  Filled 2014-07-13: qty 1

## 2014-07-13 MED ORDER — HYDRALAZINE HCL 50 MG PO TABS
75.0000 mg | ORAL_TABLET | Freq: Three times a day (TID) | ORAL | Status: DC
  Administered 2014-07-13 – 2014-07-14 (×4): 75 mg via ORAL
  Filled 2014-07-13 (×7): qty 1

## 2014-07-13 MED ORDER — POTASSIUM CHLORIDE CRYS ER 20 MEQ PO TBCR
40.0000 meq | EXTENDED_RELEASE_TABLET | Freq: Once | ORAL | Status: AC
  Administered 2014-07-13: 40 meq via ORAL
  Filled 2014-07-13: qty 2

## 2014-07-13 NOTE — Progress Notes (Addendum)
Pt starting having leg cramps, requesting mustard. Pt given packets of mustard at this time. Pt has been administered 80 mEq PO potassium this AM.

## 2014-07-13 NOTE — Progress Notes (Addendum)
Advanced Heart Failure Rounding Note   Subjective:    Admitted with marked volume overload. Diuresing with IV lasix. Placed on NTG drip for hypertensive emergency. Remains on NTG drip at 140 mcg.  Started on enalapril and hydralazine. Diuresed 1.9 liters last night. Weight down 6 pounds.    Denies SOB.     Objective:   Weight Range:  Vital Signs:   Temp:  [97.8 F (36.6 C)-98.1 F (36.7 C)] 97.8 F (36.6 C) (06/16 0400) Pulse Rate:  [79-97] 87 (06/16 0530) Resp:  [9-32] 9 (06/16 0530) BP: (130-215)/(60-157) 177/80 mmHg (06/16 0530) SpO2:  [90 %-99 %] 90 % (06/16 0530) Weight:  [223 lb 1.7 oz (101.2 kg)-238 lb (107.956 kg)] 223 lb 1.7 oz (101.2 kg) (06/16 0500)    Weight change: Filed Weights   07/12/14 1625 07/12/14 2114 07/13/14 0500  Weight: 238 lb (107.956 kg) 229 lb 4.5 oz (104 kg) 223 lb 1.7 oz (101.2 kg)    Intake/Output:   Intake/Output Summary (Last 24 hours) at 07/13/14 0753 Last data filed at 07/13/14 0600  Gross per 24 hour  Intake  777.4 ml  Output   2625 ml  Net -1847.6 ml     Physical Exam: General:  NAD HEENT: normal Neck: supple. JVP to jaw  . Carotids 2+ bilat; no bruits. No lymphadenopathy or thryomegaly appreciated. Cor: PMI nondisplaced. Regular rate & rhythm. No rubs, gallops or murmurs. Lungs: Decreased in bases Abdomen: soft, nontender, nondistended. No hepatosplenomegaly. No bruits or masses. Good bowel sounds. Extremities: no cyanosis, clubbing, rash, R and LLE 2+ edema Neuro: alert & orientedx3, cranial nerves grossly intact. moves all 4 extremities w/o difficulty. Affect pleasant  Telemetry: NSR   Labs: Basic Metabolic Panel:  Recent Labs Lab 07/12/14 1347 07/13/14 0250  NA 138 141  K 3.4* 3.0*  CL 102 97*  CO2 28 34*  GLUCOSE 305* 230*  BUN 12 10  CREATININE 0.85 0.68  CALCIUM 9.3 9.3    Liver Function Tests: No results for input(s): AST, ALT, ALKPHOS, BILITOT, PROT, ALBUMIN in the last 168 hours. No results for  input(s): LIPASE, AMYLASE in the last 168 hours. No results for input(s): AMMONIA in the last 168 hours.  CBC:  Recent Labs Lab 07/12/14 1347  WBC 7.8  NEUTROABS 5.6  HGB 11.2*  HCT 32.9*  MCV 89.6  PLT 377    Cardiac Enzymes: No results for input(s): CKTOTAL, CKMB, CKMBINDEX, TROPONINI in the last 168 hours.  BNP: BNP (last 3 results)  Recent Labs  07/12/14 1347  BNP 492.5*    ProBNP (last 3 results)  Recent Labs  12/29/13 1422 01/05/14 1404  PROBNP 812.3* 818.5*      Other results:  Imaging: Dg Chest 2 View  07/12/2014   CLINICAL DATA:  SOB x 1 week.  Upper left CP x 1 day.  EXAM: CHEST  2 VIEW  COMPARISON:  02/03/2014  FINDINGS: There is no focal parenchymal opacity. There is no pleural effusion or pneumothorax. There is stable cardiomegaly. There is a 3-lead AICD.  The osseous structures are unremarkable.  IMPRESSION: No active cardiopulmonary disease.   Electronically Signed   By: Kathreen Devoid   On: 07/12/2014 14:25      Medications:     Scheduled Medications: . aspirin EC  81 mg Oral Daily  . atorvastatin  40 mg Oral Daily  . enalapril  5 mg Oral BID  . furosemide  80 mg Intravenous BID  . gabapentin  300 mg Oral BID  .  heparin  5,000 Units Subcutaneous 3 times per day  . hydrALAZINE  50 mg Oral TID  . insulin aspart  0-15 Units Subcutaneous TID WC  . insulin glargine  50 Units Subcutaneous QHS  . potassium chloride  40 mEq Oral BID  . sodium chloride  3 mL Intravenous Q12H  . spironolactone  25 mg Oral Daily     Infusions: . nitroGLYCERIN 140 mcg/min (07/13/14 0517)     PRN Medications:  sodium chloride, acetaminophen, albuterol, HYDROcodone-acetaminophen, naproxen, nitroGLYCERIN, ondansetron (ZOFRAN) IV, sodium chloride   Assessment/Plan    64 yo with history of nonischemic cardiomyopathy presented today with hypertensive emergency and acute on chronic systolic CHF.  1. Acute on chronic systolic CHF: Nonischemic cardiomyopathy.  EF had improved to 45-50% on last echo.  She has Medtronic CRT-D device.  She has been off Coreg and enalapril long-term, she has been off Lasix for around a week or so. Admit with marked volume overload. NYHA class IV symptoms currently.  - Continue Lasix 80 mg IV bid and KCl 40 mEq bid. Give extra 40 meq now. Continue 25 mg daily spiro.  - NTG gtt to control BP. Start to wean today.   - Increase hydralazine 75 mg tid.  - Stop enalapril  Start losartan 25 mg twice a day. Can eventually transition over to [facility].  - Hold off on Coreg until volume is more stable (she has not been taking recently).  - Repeat echo.  - Medtronic to interrogation showed--> Few short episodes of afib. Rare NSVT.  2. HTN: Hypertensive emergency with CHF. NTG gtt acutely, titrate to keep SBP < 160 for now. Will eventually transition over to Bidil.  3. Diabetes: Cover with sliding scale insulin for now.  4. PAF- Having episodes of A fib noted medtronic interrogation. Short burst 63 less than a minute.  Afib less than 0.1% daily.      Length of Stay: 1   CLEGG,AMY NP-C  07/13/2014, 7:53 AM  Advanced Heart Failure Team Pager [phone] (M-F; [place])  Please contact [facility] for night-coverage after hours (4p -7a ) and weekends on [email or web address]  Patient seen with NP, agree with the above note.  She is diuresing and feeling better.   Echo today showed EF 40-45%, no much lower than prior echo.  BP is improving slowly.   Diuresing well so far.  Still has volume overload, continue Lasix 80 mg IV bid.  Replete K and Mg.   Think that we can start low dose Coreg, 3.125 mg bid.  Titrating down on NTG gtt, will eventually transition over to Bidil.  Increasing hydralazine.  She will continue losartan and spironolactone.   She has PAF.  Will replace ASA with Eliquis 5 mg bid.  Care management to help with meds.   [name] 07/13/2014 12:07 PM

## 2014-07-13 NOTE — Progress Notes (Signed)
Utilization Review Completed.  

## 2014-07-13 NOTE — Progress Notes (Signed)
  Echocardiogram 2D Echocardiogram has been performed.  [name] 07/13/2014, 11:07 AM

## 2014-07-13 NOTE — Progress Notes (Signed)
ANTICOAGULATION CONSULT NOTE - Initial Consult  Pharmacy Consult for apixaban Indication: atrial fibrillation  Allergies  Allergen Reactions  . Morphine And Related Nausea And Vomiting    Severe nausea    Patient Measurements: Height: 5\' 7"  (170.2 cm) Weight: 223 lb 1.7 oz (101.2 kg) IBW/kg (Calculated) : 61.6   Vital Signs: Temp: 97.8 F (36.6 C) (06/16 0400) Temp Source: Oral (06/16 0400) BP: 145/61 mmHg (06/16 1100) Pulse Rate: 90 (06/16 1100)  Labs:  Recent Labs  07/12/14 1347 07/13/14 0250  HGB 11.2*  --   HCT 32.9*  --   PLT 377  --   CREATININE 0.85 0.68    Estimated Creatinine Clearance: 90.2 mL/min (by C-G formula based on Cr of 0.68).   Medical History: Past Medical History  Diagnosis Date  . Hypertension   . CAD (coronary artery disease), native coronary artery     a. Nonobstructive by cath 02/2012 (done because of low EF).  . Tobacco abuse   . Chronic combined systolic and diastolic CHF (congestive heart failure)     a. 03/05/12 echo:  LVEF 20-25%, moderate LVH , inferior and basal to mid septal akinesis, anterior moderate to severe hypokinesis and grade 2 diastolic dysfunction. b. EF 07/2012: EF still 25% (unclear medication compliance).  . Anemia     a. Noted on 07/2012 labs, instructed to f/u PCP.  Marland Kitchen LBBB (left bundle branch block)   . History of noncompliance with medical treatment   . High cholesterol   . Chronic bronchitis     "~ every other year" (11/18/2012)  . Orthopnea   . Type II diabetes mellitus   . Headache(784.0)     "often; maybe not daily" (11/18/2012)  . Arthritis     "joints" (11/18/2012)  . Chronic lower back pain   . Automatic implantable cardioverter-defibrillator in situ    Assessment: 64 yo with history of nonischemic cardiomyopathy presented today with acute on chronic systolic CHF.  Patient continues to have PAF, was on aspirin pta but will now change to apixaban.   This patients CHA2DS2-VASc Score and unadjusted  Ischemic Stroke Rate (% per year) is equal to 7.2 % stroke rate/year from a score of 5  Above score calculated as 1 point each if present [CHF, HTN, DM, Vascular=MI/PAD/Aortic Plaque, Age if 65-74, or Female] Above score calculated as 2 points each if present [Age > 75, or Stroke/TIA/TE]     Goal of Therapy:  appropriate dosing for anticoagulation Monitor platelets by anticoagulation protocol: Yes   Plan:  D/c sq heparin Start apixaban 5mg  bid this afternoon. Provide education prior to discharge  Erin Hearing PharmD., BCPS Clinical Pharmacist Pager [phone] 07/13/2014 12:32 PM

## 2014-07-14 LAB — GLUCOSE, CAPILLARY
Glucose-Capillary: 277 mg/dL — ABNORMAL HIGH (ref 65–99)
Glucose-Capillary: 161 mg/dL — ABNORMAL HIGH (ref 65–99)
Glucose-Capillary: 332 mg/dL — ABNORMAL HIGH (ref 65–99)
Glucose-Capillary: 158 mg/dL — ABNORMAL HIGH (ref 65–99)
Glucose-Capillary: 342 mg/dL — ABNORMAL HIGH (ref 65–99)

## 2014-07-14 LAB — BASIC METABOLIC PANEL
Anion gap: 10 (ref 5–15)
BUN: 12 mg/dL (ref 6–20)
CO2: 32 mmol/L (ref 22–32)
Calcium: 9.1 mg/dL (ref 8.9–10.3)
Chloride: 95 mmol/L — ABNORMAL LOW (ref 101–111)
Creatinine, Ser: 0.82 mg/dL (ref 0.44–1.00)
GFR calc Af Amer: >60 mL/min (ref >60)
GFR calc non Af Amer: >60 mL/min (ref >60)
Glucose, Bld: 266 mg/dL — ABNORMAL HIGH (ref 65–99)
Potassium: 3.8 mmol/L (ref 3.5–5.1)
Sodium: 137 mmol/L (ref 135–145)

## 2014-07-14 LAB — MAGNESIUM: Magnesium: 1.9 mg/dL (ref 1.7–2.4)

## 2014-07-14 MED ORDER — CARVEDILOL 6.25 MG PO TABS
6.2500 mg | ORAL_TABLET | Freq: Two times a day (BID) | ORAL | Status: DC
  Administered 2014-07-14 – 2014-07-15 (×2): 6.25 mg via ORAL
  Filled 2014-07-14 (×4): qty 1

## 2014-07-14 MED ORDER — METOLAZONE 2.5 MG PO TABS
2.5000 mg | ORAL_TABLET | Freq: Once | ORAL | Status: AC
  Administered 2014-07-14: 2.5 mg via ORAL
  Filled 2014-07-14: qty 1

## 2014-07-14 MED ORDER — TRAZODONE HCL 50 MG PO TABS
50.0000 mg | ORAL_TABLET | Freq: Once | ORAL | Status: AC
  Administered 2014-07-14: 50 mg via ORAL
  Filled 2014-07-14: qty 1

## 2014-07-14 MED ORDER — ISOSORB DINITRATE-HYDRALAZINE 20-37.5 MG PO TABS
2.0000 | ORAL_TABLET | Freq: Three times a day (TID) | ORAL | Status: DC
  Administered 2014-07-14 – 2014-07-15 (×3): 2 via ORAL
  Filled 2014-07-14 (×5): qty 2

## 2014-07-14 NOTE — Progress Notes (Signed)
Patient ID: [name], female   DOB: Oct 09, 1950, 64 y.o.   MRN: [id number] Advanced Heart Failure Rounding Note   Subjective:    Continues to diurese well, BP better.    Objective:   Weight Range:  Vital Signs:   Temp:  [97.5 F (36.4 C)-98.4 F (36.9 C)] 97.7 F (36.5 C) (06/17 1200) Pulse Rate:  [80-113] 84 (06/17 0600) Resp:  [9-26] 18 (06/17 1200) BP: (107-158)/(42-96) 135/58 mmHg (06/17 1400) SpO2:  [80 %-100 %] 96 % (06/17 1200) Weight:  [223 lb 8.7 oz (101.4 kg)] 223 lb 8.7 oz (101.4 kg) (06/17 0500) Last BM Date:  (PTA)  Weight change: Filed Weights   07/12/14 2114 07/13/14 0500 07/14/14 0500  Weight: 229 lb 4.5 oz (104 kg) 223 lb 1.7 oz (101.2 kg) 223 lb 8.7 oz (101.4 kg)    Intake/Output:   Intake/Output Summary (Last 24 hours) at 07/14/14 1432 Last data filed at 07/14/14 1300  Gross per 24 hour  Intake   1879 ml  Output   3125 ml  Net  -1246 ml     Physical Exam: General:  NAD HEENT: normal Neck: supple. JVP 8-9 cm. Carotids 2+ bilat; no bruits. No lymphadenopathy or thryomegaly appreciated. Cor: PMI nondisplaced. Regular rate & rhythm. No rubs, gallops or murmurs. Lungs: Decreased in bases Abdomen: soft, nontender, nondistended. No hepatosplenomegaly. No bruits or masses. Good bowel sounds. Extremities: no cyanosis, clubbing, rash, trace ankle edema Neuro: alert & orientedx3, cranial nerves grossly intact. moves all 4 extremities w/o difficulty. Affect pleasant  Telemetry: NSR   Labs: Basic Metabolic Panel:  Recent Labs Lab 07/12/14 1347 07/13/14 0250 07/14/14 0225  NA 138 141 137  K 3.4* 3.0* 3.8  CL 102 97* 95*  CO2 28 34* 32  GLUCOSE 305* 230* 266*  BUN 12 10 12   CREATININE 0.85 0.68 0.82  CALCIUM 9.3 9.3 9.1  MG  --  1.4* 1.9    Liver Function Tests: No results for input(s): AST, ALT, ALKPHOS, BILITOT, PROT, ALBUMIN in the last 168 hours. No results for input(s): LIPASE, AMYLASE in the last 168 hours. No results for  input(s): AMMONIA in the last 168 hours.  CBC:  Recent Labs Lab 07/12/14 1347  WBC 7.8  NEUTROABS 5.6  HGB 11.2*  HCT 32.9*  MCV 89.6  PLT 377    Cardiac Enzymes: No results for input(s): CKTOTAL, CKMB, CKMBINDEX, TROPONINI in the last 168 hours.  BNP: BNP (last 3 results)  Recent Labs  07/12/14 1347  BNP 492.5*    ProBNP (last 3 results)  Recent Labs  12/29/13 1422 01/05/14 1404  PROBNP 812.3* 818.5*      Other results:  Imaging: No results found.   Medications:     Scheduled Medications: . apixaban  5 mg Oral BID  . atorvastatin  40 mg Oral Daily  . carvedilol  6.25 mg Oral BID WC  . furosemide  80 mg Intravenous BID  . gabapentin  300 mg Oral BID  . insulin aspart  0-15 Units Subcutaneous TID WC  . insulin glargine  50 Units Subcutaneous QHS  . isosorbide-hydrALAZINE  2 tablet Oral TID  . losartan  25 mg Oral BID  . magnesium sulfate 1 - 4 g bolus IVPB  3 g Intravenous Once  . metolazone  2.5 mg Oral Once  . potassium chloride  40 mEq Oral BID  . sodium chloride  3 mL Intravenous Q12H  . spironolactone  25 mg Oral Daily    Infusions: .  nitroGLYCERIN Stopped (07/14/14 0320)    PRN Medications: sodium chloride, acetaminophen, albuterol, HYDROcodone-acetaminophen, naproxen, nitroGLYCERIN, ondansetron (ZOFRAN) IV, sodium chloride   Assessment/Plan    64 yo with history of nonischemic cardiomyopathy presented today with hypertensive emergency and acute on chronic systolic CHF.  1. Acute on chronic systolic CHF: Nonischemic cardiomyopathy. EF 40-45% on echo this admission.  She has Medtronic CRT-D device.  She has been off Coreg and enalapril long-term, she has been off Lasix for around a week or so. Admit with marked volume overload, NYHA class IV symptoms.  - Continue Lasix 80 mg IV bid and KCl 40 mEq bid. Continue 25 mg daily spiro. I think she needs 1 more day good diuresis, will give dose of metolazone.  - Stop hydralazine, start  Bidil 2 tabs tid.  - Continue losartan, can increase Coreg to 6.25 mg bid.   - Medtronic to interrogation showed--> Few short episodes of afib. Rare NSVT.  2. HTN: Hypertensive emergency with CHF. BP gradually improving.   3. Diabetes: Cover with sliding scale insulin for now.  4. PAF: In NSR currently, started Eliquis.   5. Disposition: Hopefully home tomorrow.  Needs to stay on her meds.  Will need care management to help with meds.       Length of Stay: 2   [name] 07/14/2014 2:32 PM

## 2014-07-14 NOTE — Discharge Instructions (Signed)
Information on my medicine - ELIQUIS® (apixaban) ° °This medication education was reviewed with me or my healthcare representative as part of my discharge preparation.  The pharmacist that spoke with me during my hospital stay was:  Roen Macgowan Rhea, RPH ° °Why was Eliquis® prescribed for you? °Eliquis® was prescribed for you to reduce the risk of a blood clot forming that can cause a stroke if you have a medical condition called atrial fibrillation (a type of irregular heartbeat). ° °What do You need to know about Eliquis® ? °Take your Eliquis® TWICE DAILY - one tablet in the morning and one tablet in the evening with or without food. If you have difficulty swallowing the tablet whole please discuss with your pharmacist how to take the medication safely. ° °Take Eliquis® exactly as prescribed by your doctor and DO NOT stop taking Eliquis® without talking to the doctor who prescribed the medication.  Stopping may increase your risk of developing a stroke.  Refill your prescription before you run out. ° °After discharge, you should have regular check-up appointments with your healthcare provider that is prescribing your Eliquis®.  In the future your dose may need to be changed if your kidney function or weight changes by a significant amount or as you get older. ° °What do you do if you miss a dose? °If you miss a dose, take it as soon as you remember on the same day and resume taking twice daily.  Do not take more than one dose of ELIQUIS at the same time to make up a missed dose. ° °Important Safety Information °A possible side effect of Eliquis® is bleeding. You should call your healthcare provider right away if you experience any of the following: °  Bleeding from an injury or your nose that does not stop. °  Unusual colored urine (red or dark brown) or unusual colored stools (red or black). °  Unusual bruising for unknown reasons. °  A serious fall or if you hit your head (even if there is no  bleeding). ° °Some medicines may interact with Eliquis® and might increase your risk of bleeding or clotting while on Eliquis®. To help avoid this, consult your healthcare provider or pharmacist prior to using any new prescription or non-prescription medications, including herbals, vitamins, non-steroidal anti-inflammatory drugs (NSAIDs) and supplements. ° °This website has more information on Eliquis® (apixaban): http://www.eliquis.com/eliquis/home ° °

## 2014-07-14 NOTE — Progress Notes (Signed)
Inpatient Diabetes Program Recommendations  AACE/ADA: New Consensus Statement on Inpatient Glycemic Control (2013)  Target Ranges:  Prepandial:   less than 140 mg/dL      Peak postprandial:   less than 180 mg/dL (1-2 hours)      Critically ill patients:  140 - 180 mg/dL   Inpatient Diabetes Program Recommendations Insulin - Meal Coverage: consider adding Novolog 4 units TID with meals per Glycemic Control Order set Diet: add carb modified to current heart healthy diet Thank you  Raoul Pitch BSN, RN,CDE Inpatient Diabetes Coordinator [phone] (team pager)

## 2014-07-14 NOTE — Clinical Documentation Improvement (Signed)
Lab Results: Component     Latest Ref Rng 07/12/2014 07/13/2014 07/14/2014            Potassium     3.5 - 5.1 mmol/L 3.4 (L) 3.0 (L) 3.8   Treatment provided: Potassium chloride 7mEq  Bid starting 6/15 @2330   After study, please clarify the lab findings in progress notes and discharge summary   Possible Clinical Conditions:  Hypokalemia Other Unable to determine  Thank you, Kaytee Taliercio T. Pricilla Handler, MSN, MBA/MHA Clinical Documentation Specialist [email or web address] Office # [phone]

## 2014-07-14 NOTE — Care Management Note (Addendum)
Case Management Note  Patient Details  Name: LAYCE SPRUNG MRN: [id number] Date of Birth: March 10, 1950  Subjective/Objective:            CHF        Action/Plan: Home  Expected Discharge Date:                  Expected Discharge Plan:  Home/Self Care  In-House Referral:     Discharge planning Services  CM Consult   Status of Service:  Completed, signed off  Medicare Important Message Given:  Yes Date Medicare IM Given:  07/14/14 Medicare IM give by:  Jonnie Finner RN CCM Date Additional Medicare IM Given:    Additional Medicare Important Message give by:     If discussed at [place] of Stay Meetings, dates discussed:    Additional Comments: Consulted for Eliquis. NCM spoke to pt and she lives at home with adult sons. States she has RW, 3n1, cane and shower chair at home. Has private aide through her Medicaid with [employer], aide comes M-S for 3 hours per day. Provided pt with Eliquis 30 day free trial card, instructed pt to take with her to her pharmacy with Rx. Pt has Medicaid that covers her medications. Eliquis is a preferred drug for Medicaid.   Erenest Rasher, RN 07/14/2014, 3:04 PM

## 2014-07-15 LAB — BASIC METABOLIC PANEL
Anion gap: 8 (ref 5–15)
BUN: 22 mg/dL — ABNORMAL HIGH (ref 6–20)
CO2: 32 mmol/L (ref 22–32)
Calcium: 9.2 mg/dL (ref 8.9–10.3)
Chloride: 96 mmol/L — ABNORMAL LOW (ref 101–111)
Creatinine, Ser: 1.05 mg/dL — ABNORMAL HIGH (ref 0.44–1.00)
GFR calc Af Amer: >60 mL/min (ref >60)
GFR calc non Af Amer: 56 mL/min — ABNORMAL LOW (ref >60)
Glucose, Bld: 187 mg/dL — ABNORMAL HIGH (ref 65–99)
Potassium: 4.3 mmol/L (ref 3.5–5.1)
Sodium: 136 mmol/L (ref 135–145)

## 2014-07-15 LAB — GLUCOSE, CAPILLARY
Glucose-Capillary: 189 mg/dL — ABNORMAL HIGH (ref 65–99)
Glucose-Capillary: 176 mg/dL — ABNORMAL HIGH (ref 65–99)

## 2014-07-15 MED ORDER — CARVEDILOL 6.25 MG PO TABS: 6.2500 mg | ORAL_TABLET | Freq: Two times a day (BID) | ORAL | Status: DC

## 2014-07-15 MED ORDER — ISOSORB DINITRATE-HYDRALAZINE 20-37.5 MG PO TABS: 2.0000 | ORAL_TABLET | Freq: Three times a day (TID) | ORAL | Status: DC

## 2014-07-15 MED ORDER — LOSARTAN POTASSIUM 25 MG PO TABS: 25.0000 mg | ORAL_TABLET | Freq: Two times a day (BID) | ORAL | Status: DC

## 2014-07-15 MED ORDER — SPIRONOLACTONE 25 MG PO TABS: 25.0000 mg | ORAL_TABLET | Freq: Every day | ORAL | Status: DC

## 2014-07-15 MED ORDER — APIXABAN 5 MG PO TABS: 5.0000 mg | ORAL_TABLET | Freq: Two times a day (BID) | ORAL | Status: DC

## 2014-07-15 MED ORDER — POTASSIUM CHLORIDE CRYS ER 20 MEQ PO TBCR: 20.0000 meq | EXTENDED_RELEASE_TABLET | Freq: Two times a day (BID) | ORAL | Status: DC

## 2014-07-15 NOTE — Discharge Summary (Signed)
Physician Discharge Summary  Patient ID: [name] MRN: [id number] DOB/AGE: November 16, 1950 64 y.o.   Primary Cardiologist: Dr. Aundra Dubin  Admit date: 07/12/2014 Discharge date: 07/15/2014  Admission Diagnoses: Acute on Chronic Combined Systolic and Diastolic HF  Discharge Diagnoses:  Active Problems:   Acute on chronic combined systolic and diastolic heart failure   Discharged Condition: stable  Hospital Course: 64 y/o female, followed by Dr. Aundra Dubin, with a history of nonischemic cardiomyopathy, first diagnosed in Feb 2014 when she presented with an acute exacerbation. EF at that time was 20-25% on echo. LHC showed nonobstructive CAD. She had a chronic LBBB and a Medtronic CRT-D device was implanted in 10/14. Repeat echo in 2015 showed improvement in EF to 40-45%. Other medical problems include HTN and DM.   On 07/12/14, she presented to [facility] with complaints of dyspnea and increased lower extremity swelling. This was in the setting of medication noncompliance. She had discontinued her BB and ARB and ran out of her Lasix, 1 week prior to presentation.  In the ER, BP was markedly elevated at > 200/100. She was markedly volume overloaded on exam. She was admitted for hypertensive emergency and acute on chronic systolic CHF. She was started on NTG gtt for BP control and IV lasix for diuresis. ARB (losartan) was restarted. Repeat echo showed stable LV function with no change compared to prior study. EF was 40-45%. Interrogation of her defibrillator showed few short episodes of afib and rare NSVT. Given and elevated CHA2DS2 VASc score of 4 (CHF, HTN, DM, Female Sex), Eliquis was started for her PAF. She had good diuresis with IV Lasix. I/Os net negative 3.4L total. She was transitioned back to PO lasix. Coreg was restarted after she was diuresed back to a euvolemic state. Her BP improved and Bidil was added. Her symptoms resolved. On hospital day 3, she was seen and examined by [name] who determined  she was stable for discharge home. Weight day of discharge was 225 lb. TOC f/u will be arranged either with Dr. Aundra Dubin or an APP.    Consults: None  Significant Diagnostic Studies:  2D echo 07/13/14 Study Conclusions  - Left ventricle: Septal and inferior wall hypokinesis. The cavity size was mildly dilated. Wall thickness was increased in a pattern of mild LVH. Systolic function was mildly to moderately reduced. The estimated ejection fraction was in the range of 40% to 45%. Doppler parameters are consistent with elevated ventricular end-diastolic filling pressure. - Pericardium, extracardiac: small pericardial effusion no tamponade.   Treatments: See Hospital Course  Discharge Exam: Blood pressure 106/61, pulse 99, temperature 99 F (37.2 C), temperature source Oral, resp. rate 16, height 5\' 7"  (1.702 m), weight 225 lb 4.8 oz (102.195 kg), SpO2 98 %.   Disposition: 01-Home or Self Care      Discharge Instructions    Diet - low sodium heart healthy    Complete by:  As directed      Increase activity slowly    Complete by:  As directed             Medication List    STOP taking these medications        ALEVE 220 MG tablet  Generic drug:  naproxen sodium     aspirin EC 81 MG tablet     enalapril 10 MG tablet  Commonly known as:  VASOTEC     hydrALAZINE 50 MG tablet  Commonly known as:  APRESOLINE     isosorbide mononitrate 30 MG 24  hr tablet  Commonly known as:  IMDUR      TAKE these medications        albuterol 108 (90 BASE) MCG/ACT inhaler  Commonly known as:  PROVENTIL HFA;VENTOLIN HFA  Inhale 2 puffs into the lungs every 6 (six) hours as needed for wheezing or shortness of breath.     apixaban 5 MG Tabs tablet  Commonly known as:  ELIQUIS  Take 1 tablet (5 mg total) by mouth 2 (two) times daily.     atorvastatin 40 MG tablet  Commonly known as:  LIPITOR  Take 40 mg by mouth daily.     carvedilol 6.25 MG tablet  Commonly known as:   COREG  Take 1 tablet (6.25 mg total) by mouth 2 (two) times daily.     furosemide 40 MG tablet  Commonly known as:  LASIX  Take 1 tablet (40 mg total) by mouth 2 (two) times daily.     HYDROcodone-acetaminophen 10-325 MG per tablet  Commonly known as:  NORCO  Take 2 tablets by mouth every 6 (six) hours as needed for moderate pain.     isosorbide-hydrALAZINE 20-37.5 MG per tablet  Commonly known as:  BIDIL  Take 2 tablets by mouth 3 (three) times daily.     LANTUS SOLOSTAR 100 UNIT/ML Solostar Pen  Generic drug:  Insulin Glargine  Inject 50 Units into the skin at bedtime.     losartan 25 MG tablet  Commonly known as:  COZAAR  Take 1 tablet (25 mg total) by mouth 2 (two) times daily.     metFORMIN 1000 MG tablet  Commonly known as:  GLUCOPHAGE  Take 1 tablet (1,000 mg total) by mouth 2 (two) times daily with a meal. HOLD for 2 days then resume on 11/21/2012     potassium chloride SA 20 MEQ tablet  Commonly known as:  K-DUR,KLOR-CON  Take 1 tablet (20 mEq total) by mouth 2 (two) times daily.     rizatriptan 5 MG tablet  Commonly known as:  MAXALT  Take 5 mg by mouth 3 (three) times daily.     spironolactone 25 MG tablet  Commonly known as:  ALDACTONE  Take 1 tablet (25 mg total) by mouth daily.       Follow-up Information    Follow up with Colleen Champagne, MD.   Specialty:  Cardiology   Why:  our office will call you with a follow-up appointment   Contact information:   [address] [place] [address] [phone]       TIME SPENT ON DISCHARGE, INCLUDING PHYSICIAN TIME: >30 MINUTES  Signed: Lyda Jester 07/15/2014, 10:38 AM  Cardiology Attending  Patient seen and examined. Agree with above. [place] [facility] for discharge.  Mikle Bosworth.D.

## 2014-07-15 NOTE — Progress Notes (Signed)
Patient ID: NORIKO MACARI, female   DOB: Jul 26, 1950, 64 y.o.   MRN: [id number]    Patient Name: [name] Date of Encounter: 07/15/2014     Active Problems:   Acute on chronic combined systolic and diastolic heart failure    SUBJECTIVE  "I am ready to go home". No chest pain. Dyspnea at baseline.  CURRENT MEDS . apixaban  5 mg Oral BID  . atorvastatin  40 mg Oral Daily  . carvedilol  6.25 mg Oral BID WC  . furosemide  80 mg Intravenous BID  . gabapentin  300 mg Oral BID  . insulin aspart  0-15 Units Subcutaneous TID WC  . insulin glargine  50 Units Subcutaneous QHS  . isosorbide-hydrALAZINE  2 tablet Oral TID  . losartan  25 mg Oral BID  . magnesium sulfate 1 - 4 g bolus IVPB  3 g Intravenous Once  . potassium chloride  40 mEq Oral BID  . sodium chloride  3 mL Intravenous Q12H  . spironolactone  25 mg Oral Daily    OBJECTIVE  Filed Vitals:   07/14/14 1522 07/14/14 2052 07/15/14 0623 07/15/14 0840  BP: 108/62 112/60 123/70 106/61  Pulse:  96 102 99  Temp: 98.2 F (36.8 C) 98.9 F (37.2 C) 99 F (37.2 C)   TempSrc: Oral Oral Oral   Resp: 18 16 18 16   Height: 5\' 7"  (1.702 m)     Weight: 224 lb 3.2 oz (101.696 kg)  225 lb 4.8 oz (102.195 kg)   SpO2: 95% 95% 100% 98%    Intake/Output Summary (Last 24 hours) at 07/15/14 1001 Last data filed at 07/15/14 0824  Gross per 24 hour  Intake   1105 ml  Output    750 ml  Net    355 ml   Filed Weights   07/14/14 0500 07/14/14 1522 07/15/14 0623  Weight: 223 lb 8.7 oz (101.4 kg) 224 lb 3.2 oz (101.696 kg) 225 lb 4.8 oz (102.195 kg)    PHYSICAL EXAM  General: Pleasant, NAD. Neuro: Alert and oriented X 3. Moves all extremities spontaneously. Psych: Normal affect. HEENT:  Normal  Neck: Supple without bruits or JVD. Lungs:  Resp regular and unlabored, CTA. Heart: RRR no s3, s4, or murmurs. Abdomen: Soft, non-tender, non-distended, BS + x 4.  Extremities: No clubbing, cyanosis or edema. DP/PT/Radials 2+ and equal  bilaterally.  Accessory Clinical Findings  CBC  Recent Labs  07/12/14 1347  WBC 7.8  NEUTROABS 5.6  HGB 11.2*  HCT 32.9*  MCV 89.6  PLT 476   Basic Metabolic Panel  Recent Labs  07/13/14 0250 07/14/14 0225 07/15/14 0548  NA 141 137 136  K 3.0* 3.8 4.3  CL 97* 95* 96*  CO2 34* 32 32  GLUCOSE 230* 266* 187*  BUN 10 12 22*  CREATININE 0.68 0.82 1.05*  CALCIUM 9.3 9.1 9.2  MG 1.4* 1.9  --    Liver Function Tests No results for input(s): AST, ALT, ALKPHOS, BILITOT, PROT, ALBUMIN in the last 72 hours. No results for input(s): LIPASE, AMYLASE in the last 72 hours. Cardiac Enzymes No results for input(s): CKTOTAL, CKMB, CKMBINDEX, TROPONINI in the last 72 hours. BNP Invalid input(s): POCBNP D-Dimer No results for input(s): DDIMER in the last 72 hours. Hemoglobin A1C No results for input(s): HGBA1C in the last 72 hours. Fasting Lipid Panel No results for input(s): CHOL, HDL, LDLCALC, TRIG, CHOLHDL, LDLDIRECT in the last 72 hours. Thyroid Function Tests No results for input(s): TSH,  T4TOTAL, T3FREE, THYROIDAB in the last 72 hours.  Invalid input(s): FREET3  TELE  NSR  Radiology/Studies  Dg Chest 2 View  07/12/2014   CLINICAL DATA:  SOB x 1 week.  Upper left CP x 1 day.  EXAM: CHEST  2 VIEW  COMPARISON:  02/03/2014  FINDINGS: There is no focal parenchymal opacity. There is no pleural effusion or pneumothorax. There is stable cardiomegaly. There is a 3-lead AICD.  The osseous structures are unremarkable.  IMPRESSION: No active cardiopulmonary disease.   Electronically Signed   By: Kathreen Devoid   On: 07/12/2014 14:25    ASSESSMENT AND PLAN  1.acute on chronic systolic heart failure 2. PAF 3. HTN Rec: ok for discharge home on current meds. As per Dr. Claris Gladden note, plan [facility] appointment and followup with him in several weeks. DC on home dose of lasix and add Bidil. Continue other home meds.  Tramar Brueckner,M.D.  07/15/2014 10:01 AM

## 2014-07-15 NOTE — Progress Notes (Signed)
1200 discharge instructions given .Verbalized understanding /

## 2014-07-17 ENCOUNTER — Telehealth: Payer: Self-pay | Admitting: Nurse Practitioner

## 2014-07-17 IMAGING — CT CT ANGIO CHEST
2 of 7 series · 19 of 36 positions shown · IV contrast (omnipaque)
Comparison: None.

CLINICAL DATA: Severe shortness of breath, cough, and chest pain.

CT ANGIOGRAPHY CHEST
TECHNIQUE: Multidetector CT imaging of the chest using the
standard protocol during bolus administration of intravenous
contrast. Multiplanar reconstructed images including MIPs were
obtained and reviewed to evaluate the vascular anatomy.
Contrast: 60mL OMNIPAQUE IOHEXOL 350 MG/ML SOLN

[Series 6: pulm embolism 1.0 b25f st · axial · 0.76mm/px · z∈[-66,+180]mm · 18 of 274 slices shown]
[im 14/274  lung]
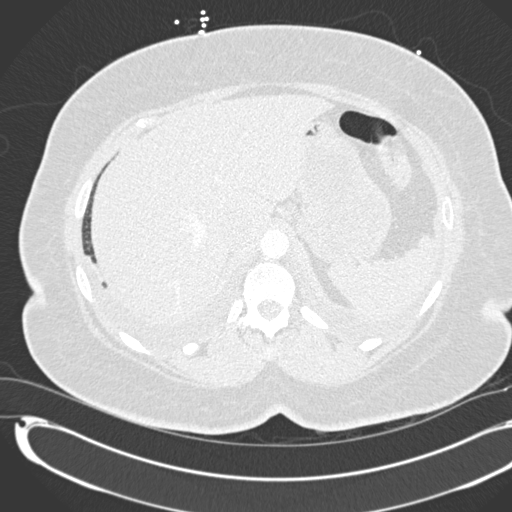
[im 28/274  mediastinal]
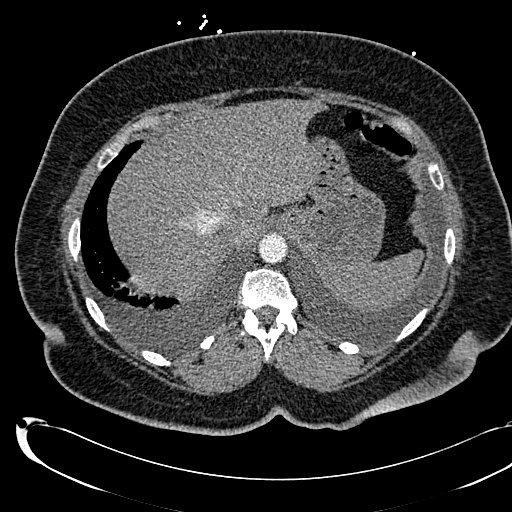
[im 41/274  lung]
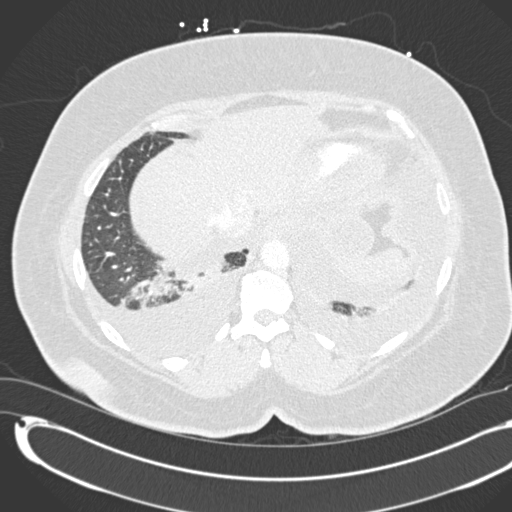
[im 55/274  mediastinal]
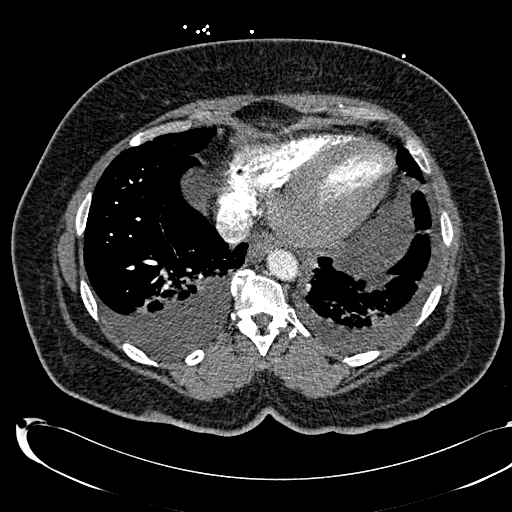
[im 69/274  lung]
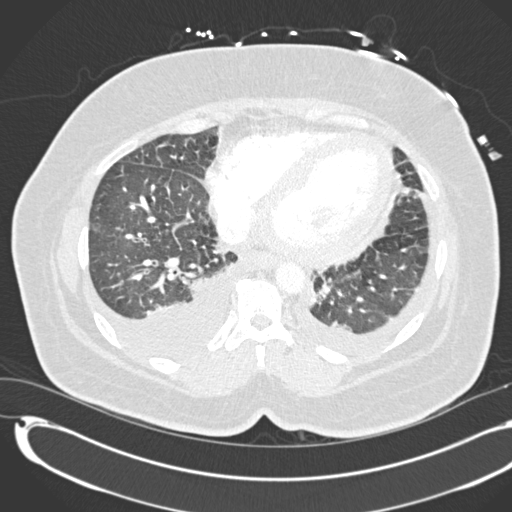
[im 82/274  mediastinal]
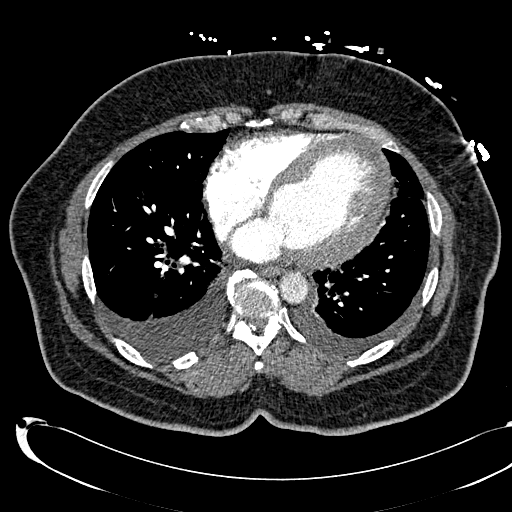
[im 96/274  lung]
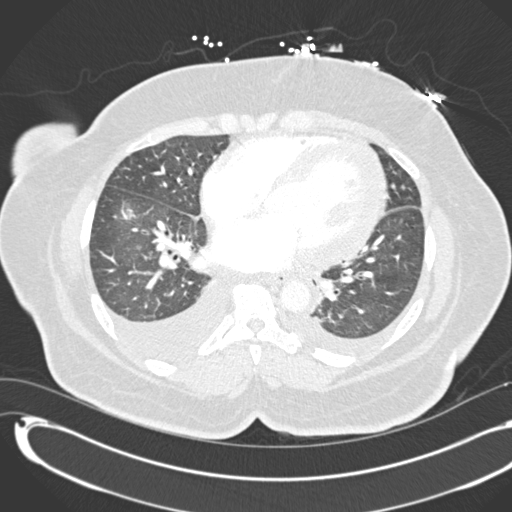
[im 110/274  mediastinal]
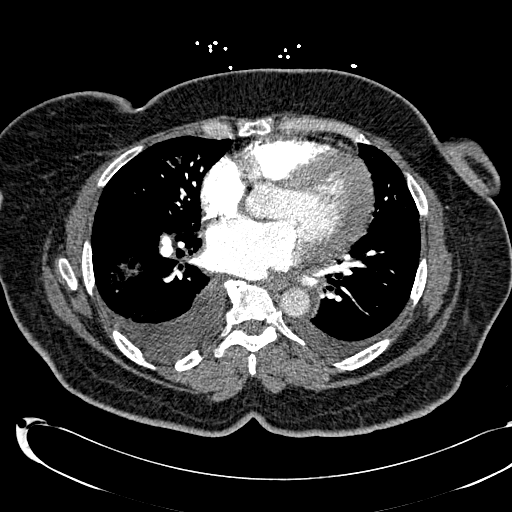
[im 123/274  lung]
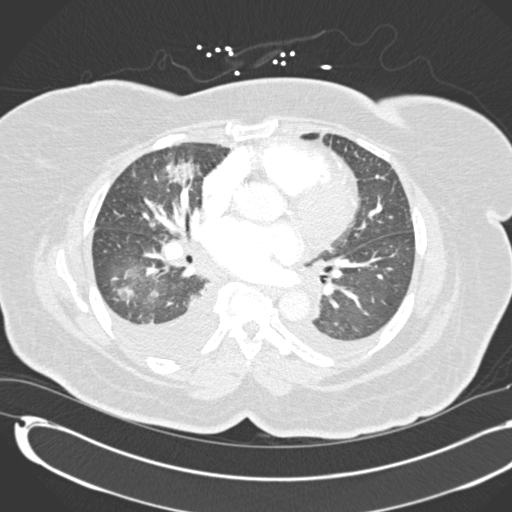
[im 151/274  mediastinal]
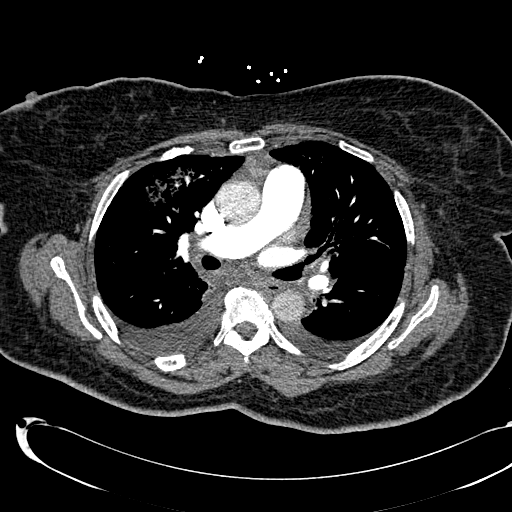
[im 164/274  lung]
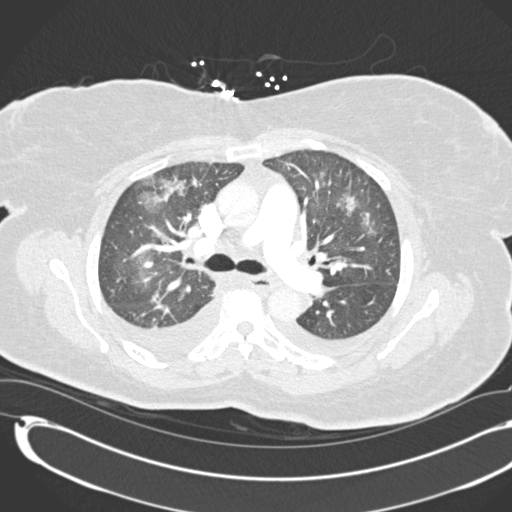
[im 178/274  mediastinal]
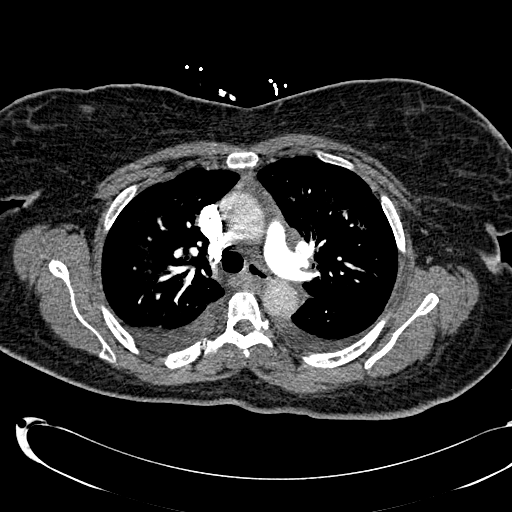
[im 192/274  lung]
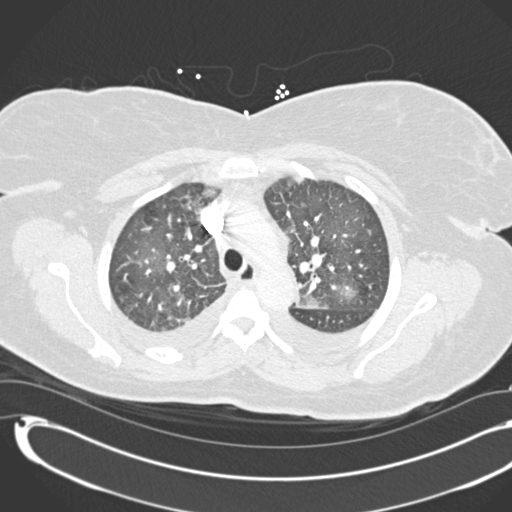
[im 205/274  mediastinal]
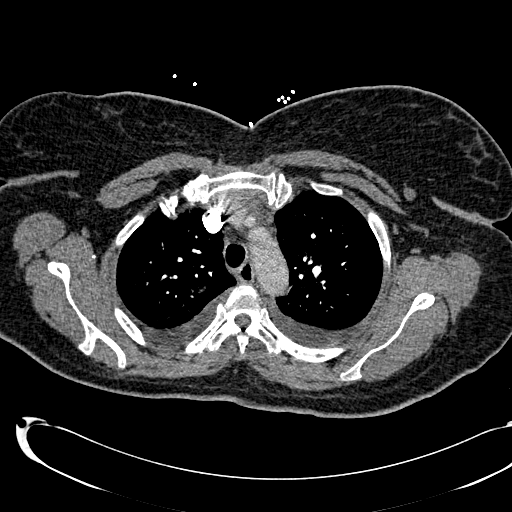
[im 219/274  lung]
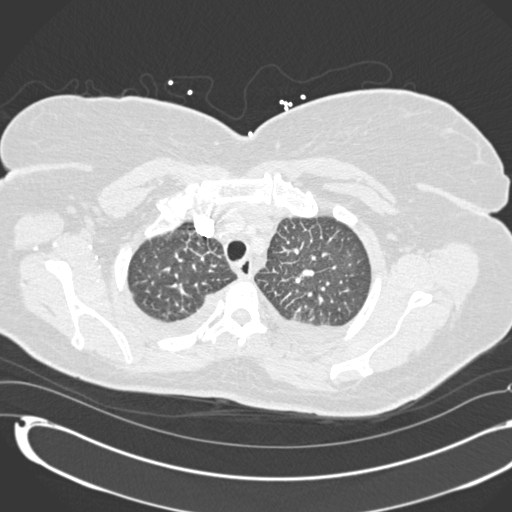
[im 233/274  mediastinal]
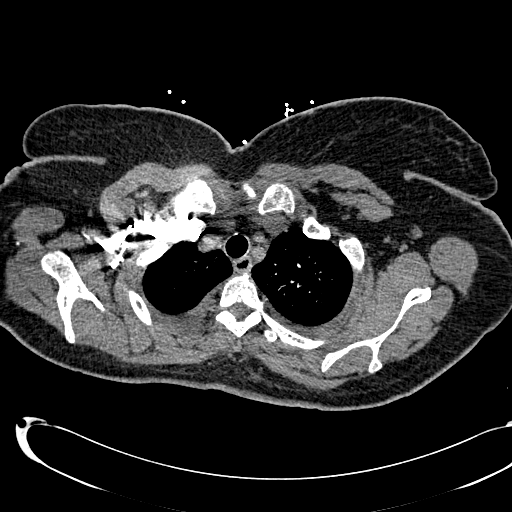
[im 246/274  lung]
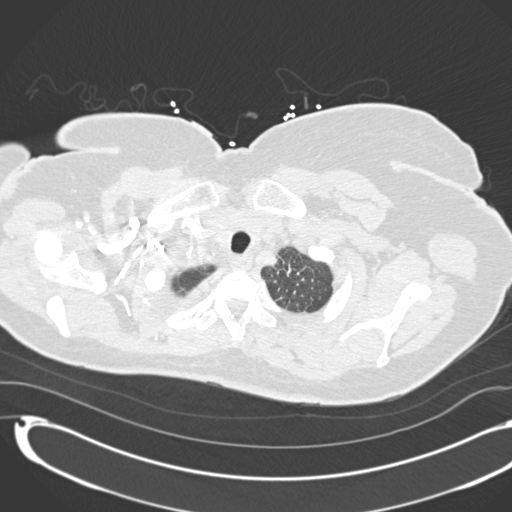
[im 260/274  mediastinal]
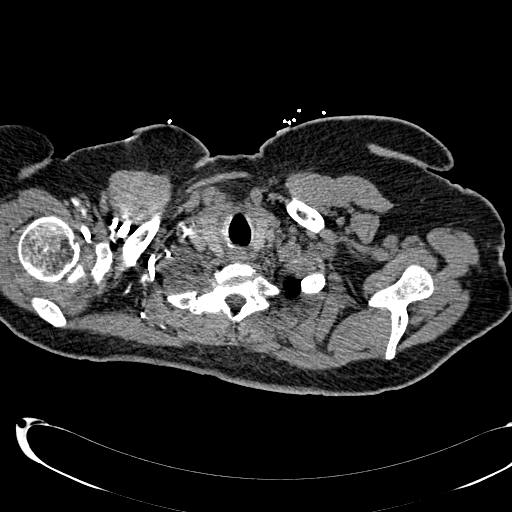

[Series 8: coronals · coronal · 0.58mm/px · 1 of 115 slices shown]
[im 58/115  mediastinal]
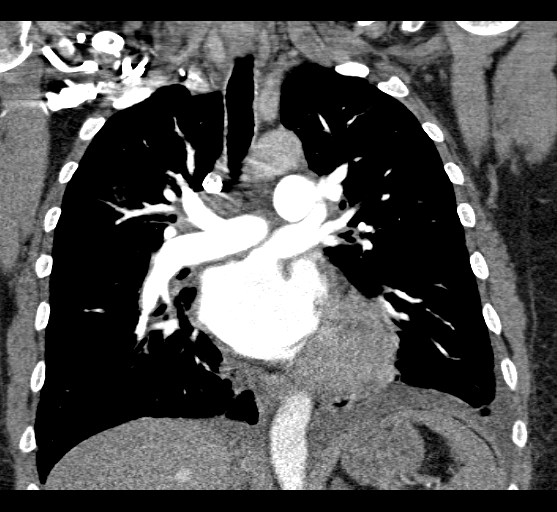

[19 of 36 positions shown; findings below may reference images not displayed]

FINDINGS: Technically adequate study with good opacification of the
central and segmental pulmonary arteries.  No focal filling defects
are demonstrated.  No evidence of significant pulmonary embolus.

Cardiac enlargement.  No pericardial effusions.  Normal caliber
thoracic aorta.  No significant lymphadenopathy in the chest.  The
esophagus is decompressed.

There are small bilateral pleural effusions, greater on the right
side, with atelectasis or consolidation in both lung bases.  There
are multi focal patchy perihilar and peripheral areas of airspace
disease throughout both lungs.  This pattern may represent
multifocal pneumonia or edema.  Nodular infiltrative process not
excluded.  No pneumothorax.  Airways appear patent.  Visualized
portions of the upper abdominal organs are unremarkable.
Degenerative changes in the thoracic spine with normal alignment.
IMPRESSION: No evidence of significant pulmonary embolus.  Cardiac enlargement
with pulmonary vascular congestion.  Bilateral pleural effusions.
Bibasilar atelectasis.  Patchy areas of airspace disease diffusely
throughout both lungs.  Findings likely represent edema or
multifocal pneumonia.

## 2014-07-17 IMAGING — CR DG CHEST 1V PORT
1 series · 1 of 1 positions shown · non-contrast
Comparison: PA and lateral chest 01/26/2012.

CLINICAL DATA: Chest pain, cough and shortness of breath.

PORTABLE CHEST - 1 VIEW

[AP]
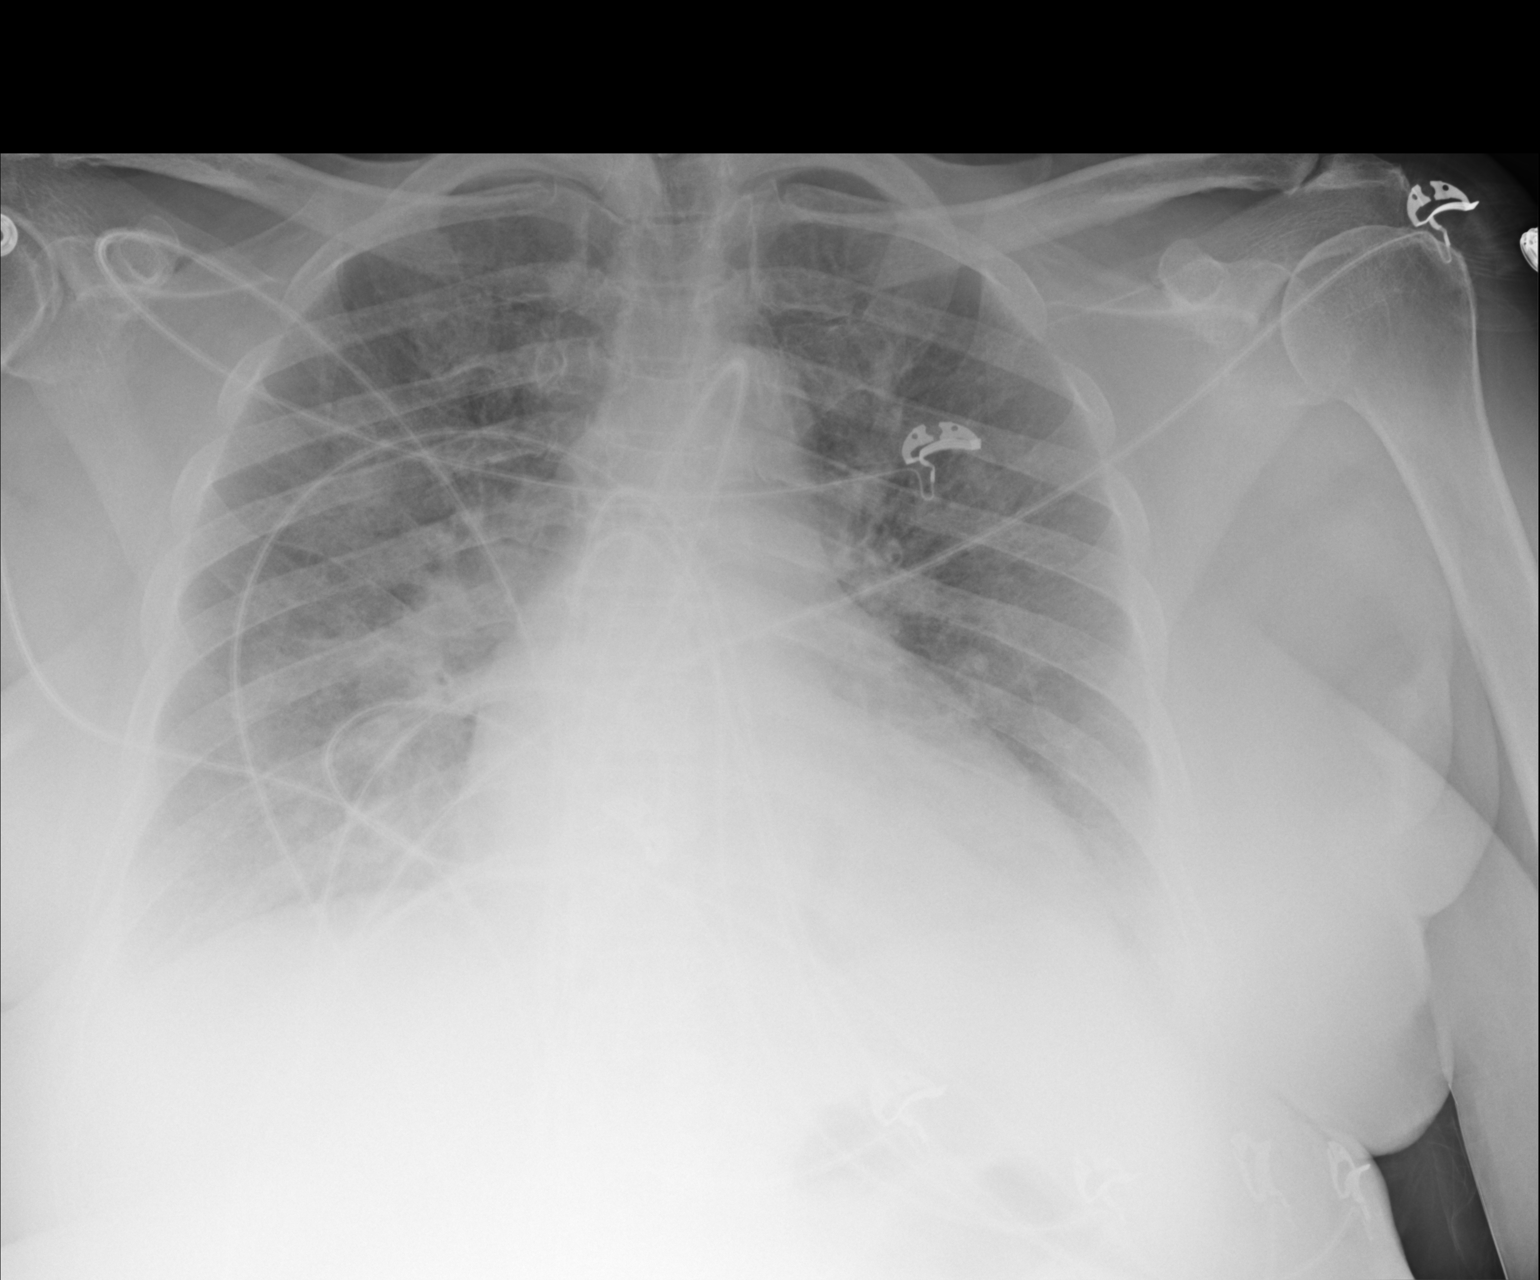

[1 of 1 positions shown; findings below may reference images not displayed]

FINDINGS: There is indistinctness of the pulmonary vasculature
compatible with interstitial edema.  Heart size is enlarged.  No
pneumothorax is identified.  There may be small bilateral pleural
effusions and basilar atelectasis.  Study is somewhat limited by
portable technique and the patient's body habitus.
IMPRESSION: Mild interstitial edema with possible small bilateral pleural
effusions and basilar atelectasis.

## 2014-07-17 NOTE — Telephone Encounter (Signed)
7-10 TCM w/ Cecille Rubin per [employer]

## 2014-07-17 NOTE — Telephone Encounter (Signed)
Left message for patient to call back.   TCM 6/28 @ 8:30AM with Truitt Merle.

## 2014-07-18 ENCOUNTER — Telehealth: Payer: Self-pay

## 2014-07-18 NOTE — Telephone Encounter (Signed)
Prior auth for Elliquis 5 mg sent to [facility] via Cover My Meds

## 2014-07-18 NOTE — Telephone Encounter (Signed)
Patient contacted regarding discharge from [facility] 6[facility]18  Patient understands to follow up with provider ? 6/28 @ 8:30am with Truitt Merle, NP; told to arrive 15 mins early Patient understands discharge instructions? Yes  Patient understands medications and regiment? Yes , reviewed in detail specific medication to continue and those that she should no longer be taking.  Patient understands to bring all medications to this visit? Yes

## 2014-07-19 ENCOUNTER — Telehealth: Payer: Self-pay | Admitting: Cardiology

## 2014-07-19 ENCOUNTER — Telehealth: Payer: Self-pay

## 2014-07-19 MED ORDER — HYDROCODONE-ACETAMINOPHEN 10-325 MG PO TABS: 1.0000 | ORAL_TABLET | Freq: Two times a day (BID) | ORAL | Status: DC | PRN

## 2014-07-19 NOTE — Telephone Encounter (Signed)
New Message       Pt calling stating that when she was in the hospital Dr. Aundra Dubin talked to her about prescribing the pt pain medication. Pt calling to find out when she will be able to get the medication. Please call back and advise.

## 2014-07-19 NOTE — Telephone Encounter (Signed)
Prior auth sent to [facility] for Losartan 25mg  via Cover My Meds.

## 2014-07-19 NOTE — Telephone Encounter (Signed)
We can give her 10 days' worth of hydrocodone bid prn, but needs to get in to see someone to manage her pain, can refer to [facility], [facility] or [facility].

## 2014-07-19 NOTE — Telephone Encounter (Signed)
RX signed by Dr Aundra Dubin, pt aware we will supply just a small amount 1 time only further refills will need to come from pcp, she states she has an appt on 7/8.  Pt unable to p/u rx due to transportation, rx mailed to her today

## 2014-07-19 NOTE — Telephone Encounter (Signed)
Patient states that when she was in the hospital (0/13-1/43 for acute systolic heart failure), Dr. Aundra Dubin told her that he would prescribe her pain medication (Hydrocodone) to take at home. She says that she never rec'd the rx. She does not have a primary care MD. She says she has been taking Aleve without significant relief. She states she is unable to do much due to the pain. Pain is rated as "really really bad" and is in her hip area. She has had bilateral THR previously. Patient says this is ongoing and restricts her activity and sleep. Routing to Dr. Aundra Dubin.

## 2014-07-19 NOTE — Telephone Encounter (Signed)
Eliquis approved through 07/18/2015. [id number]. Pharmacy notified.

## 2014-07-21 ENCOUNTER — Telehealth: Payer: Self-pay | Admitting: *Deleted

## 2014-07-21 ENCOUNTER — Telehealth: Payer: Self-pay

## 2014-07-21 NOTE — Telephone Encounter (Signed)
Fax from [facility] stating Losartan Potassium is on her list of covered medications.

## 2014-07-21 NOTE — Telephone Encounter (Signed)
called for fm hx/status, unable to reach pt... 

## 2014-07-25 ENCOUNTER — Encounter: Payer: Self-pay | Admitting: Nurse Practitioner

## 2014-07-25 ENCOUNTER — Ambulatory Visit (INDEPENDENT_AMBULATORY_CARE_PROVIDER_SITE_OTHER): Payer: Medicare Other | Admitting: Nurse Practitioner

## 2014-07-25 VITALS — BP 128/82 | HR 103 | Resp 20 | Ht 67.0 in | Wt 219.0 lb

## 2014-07-25 DIAGNOSIS — Z91199 Patient's noncompliance with other medical treatment and regimen due to unspecified reason: Secondary | ICD-10-CM

## 2014-07-25 DIAGNOSIS — I5022 Chronic systolic (congestive) heart failure: Secondary | ICD-10-CM

## 2014-07-25 DIAGNOSIS — Z9119 Patient's noncompliance with other medical treatment and regimen: Secondary | ICD-10-CM

## 2014-07-25 DIAGNOSIS — I10 Essential (primary) hypertension: Secondary | ICD-10-CM | POA: Diagnosis not present

## 2014-07-25 DIAGNOSIS — Z95810 Presence of automatic (implantable) cardiac defibrillator: Secondary | ICD-10-CM

## 2014-07-25 DIAGNOSIS — I429 Cardiomyopathy, unspecified: Secondary | ICD-10-CM

## 2014-07-25 DIAGNOSIS — I428 Other cardiomyopathies: Secondary | ICD-10-CM

## 2014-07-25 MED ORDER — ATORVASTATIN CALCIUM 40 MG PO TABS
40.0000 mg | ORAL_TABLET | Freq: Every day | ORAL | Status: DC
Start: 2014-07-25 — End: 2015-07-20

## 2014-07-25 MED ORDER — LOSARTAN POTASSIUM 25 MG PO TABS: 25.0000 mg | ORAL_TABLET | Freq: Two times a day (BID) | ORAL | Status: DC

## 2014-07-25 NOTE — Progress Notes (Signed)
CARDIOLOGY OFFICE NOTE  Date:  07/25/2014    [name] Date of Birth: 1950/08/23 Medical Record #[id number]  PCP:  Barbette Merino, MD  Cardiologist:  Donney Dice    Chief Complaint  Patient presents with  . Congestive Heart Failure    TOC for CHF - seen for Dr. Aundra Dubin & Lovena Le    History of Present Illness: [name] is a 64 y.o. female who presents today for a post hospital/TOC visit. She is followed by Dr. Aundra Dubin, with a history of nonischemic cardiomyopathy, first diagnosed in Feb 2014 when she presented with an acute exacerbation. EF at that time was 20-25% on echo. LHC showed nonobstructive CAD. She has a chronic LBBB and a [employer] CRT-D device was implanted in 10/14. Repeat echo in 2015 showed improvement in EF to 40-45%. Other medical problems include HTN and DM.   On 07/12/14, she presented to [facility] with complaints of dyspnea and increased lower extremity swelling. This was in the setting of medication noncompliance. She had discontinued her BB and ARB and ran out of her Lasix, 1 week prior to presentation. In the ER, BP was markedly elevated at > 200/100. She was markedly volume overloaded on exam. She was admitted for hypertensive emergency and acute on chronic systolic CHF. She was started on NTG gtt for BP control and IV lasix for diuresis. ARB (losartan) was restarted. Repeat echo showed stable LV function with no change compared to prior study. EF was 40-45%. Interrogation of her defibrillator showed few short episodes of afib and rare NSVT. Given and elevated CHA2DS2 VASc score of 4 (CHF, HTN, DM, Female Sex), Eliquis was started for her PAF. She had good diuresis with IV Lasix. I/Os net negative 3.4L total. She was transitioned back to PO lasix. Coreg was restarted after she was diuresed back to a euvolemic state. Her BP improved and Bidil was added. Her symptoms resolved. On hospital day 3, she was seen and examined by [name] who determined she was stable for  discharge home. Weight day of discharge was 225 lb.   Comes in today. Here alone. Doing ok.  She is missing several medicines. Says she needs to go to the drug store. Not on her Eliquis, Lipitor or Losartan yet. No medicines yet today because of early appointment. No chest pain. Breathing is "fine". No swelling. Weight is down. Not smoking. Asking for narcotics for chronic pain.   Past Medical History  Diagnosis Date  . Hypertension   . CAD (coronary artery disease), native coronary artery     a. Nonobstructive by cath 02/2012 (done because of low EF).  . Tobacco abuse   . Chronic combined systolic and diastolic CHF (congestive heart failure)     a. 03/05/12 echo:  LVEF 20-25%, moderate LVH , inferior and basal to mid septal akinesis, anterior moderate to severe hypokinesis and grade 2 diastolic dysfunction. b. EF 07/2012: EF still 25% (unclear medication compliance).  . Anemia     a. Noted on 07/2012 labs, instructed to f/u PCP.  Marland Kitchen LBBB (left bundle branch block)   . History of noncompliance with medical treatment   . High cholesterol   . Chronic bronchitis     "~ every other year" (11/18/2012)  . Orthopnea   . Type II diabetes mellitus   . Headache(784.0)     "often; maybe not daily" (11/18/2012)  . Arthritis     "joints" (11/18/2012)  . Chronic lower back pain   . Automatic  implantable cardioverter-defibrillator in situ     Past Surgical History  Procedure Laterality Date  . Joint replacement      Bilateral hip and right knee  . Cardiac catheterization  03/04/12    nonobstructive CAD, elevated LVEDP and tortuous vessels suggestive of long-standing hypertension  . Bi-ventricular implantable cardioverter defibrillator  (crt-d)  11/18/2012  . Left heart cath N/A 03/05/2012    Procedure: LEFT HEART CATH;  Surgeon: Larey Dresser, MD;  Location: [facility] CATH LAB;  Service: Cardiovascular;  Laterality: N/A;  . Bi-ventricular implantable cardioverter defibrillator N/A 11/18/2012    Procedure:  BI-VENTRICULAR IMPLANTABLE CARDIOVERTER DEFIBRILLATOR  (CRT-D);  Surgeon: Evans Lance, MD;  Location: [facility] CATH LAB;  Service: Cardiovascular;  Laterality: N/A;     Medications: Current Outpatient Prescriptions  Medication Sig Dispense Refill  . albuterol (PROVENTIL HFA;VENTOLIN HFA) 108 (90 BASE) MCG/ACT inhaler Inhale 2 puffs into the lungs every 6 (six) hours as needed for wheezing or shortness of breath.    . carvedilol (COREG) 12.5 MG tablet Take 12.5 mg by mouth 2 (two) times daily with a meal.    . furosemide (LASIX) 40 MG tablet Take 1 tablet (40 mg total) by mouth 2 (two) times daily. 60 tablet 6  . Insulin Glargine (LANTUS SOLOSTAR) 100 UNIT/ML Solostar Pen Inject 50 Units into the skin at bedtime.    . isosorbide-hydrALAZINE (BIDIL) 20-37.5 MG per tablet Take 2 tablets by mouth 3 (three) times daily. 180 tablet 5  . metFORMIN (GLUCOPHAGE) 1000 MG tablet Take 1 tablet (1,000 mg total) by mouth 2 (two) times daily with a meal. HOLD for 2 days then resume on 11/21/2012 (Patient taking differently: Take 1,000 mg by mouth 2 (two) times daily with a meal. )    . potassium chloride SA (K-DUR,KLOR-CON) 20 MEQ tablet Take 1 tablet (20 mEq total) by mouth 2 (two) times daily. 60 tablet 5  . rizatriptan (MAXALT) 5 MG tablet Take 5 mg by mouth 3 (three) times daily.  1  . spironolactone (ALDACTONE) 25 MG tablet Take 1 tablet (25 mg total) by mouth daily. 30 tablet 5  . apixaban (ELIQUIS) 5 MG TABS tablet Take 1 tablet (5 mg total) by mouth 2 (two) times daily. (Patient not taking: Reported on 07/25/2014) 60 tablet 5  . atorvastatin (LIPITOR) 40 MG tablet Take 1 tablet (40 mg total) by mouth daily. 90 tablet 3  . losartan (COZAAR) 25 MG tablet Take 1 tablet (25 mg total) by mouth 2 (two) times daily. 90 tablet 3   No current facility-administered medications for this visit.    Allergies: Allergies  Allergen Reactions  . Morphine And Related Nausea And Vomiting    Severe nausea    Social  History: The patient  reports that she quit smoking about 2 years ago. Her smoking use included Cigarettes. She started smoking about 42 years ago. She has a 40 pack-year smoking history. She has never used smokeless tobacco. She reports that she does not drink alcohol or use illicit drugs.   Family History: The patient's family history is negative for Heart disease.   Review of Systems: Please see the history of present illness.   Otherwise, the review of systems is positive for none.   All other systems are reviewed and negative.   Physical Exam: VS:  BP 128/82 mmHg  Pulse 103  Resp 20  Ht 5\' 7"  (1.702 m)  Wt 219 lb (99.338 kg)  BMI 34.29 kg/m2  SpO2 98% .  BMI Body  mass index is 34.29 kg/(m^2).  Wt Readings from Last 3 Encounters:  07/25/14 219 lb (99.338 kg)  07/15/14 225 lb 4.8 oz (102.195 kg)  02/03/14 222 lb 8 oz (100.925 kg)    General: Alert. Well developed, well nourished and in no acute distress.  HEENT: Normal. Neck: Supple, no JVD, carotid bruits, or masses noted.  Cardiac: Regular rate and rhythm. No murmurs, rubs, or gallops. No edema.  Respiratory:  Lungs are clear to auscultation bilaterally with normal work of breathing.  GI: Soft and nontender.  MS: No deformity or atrophy. Gait and ROM intact. Skin: Warm and dry. Color is normal.  Neuro:  Strength and sensation are intact and no gross focal deficits noted.  Psych: Alert, appropriate and with normal affect.   LABORATORY DATA:  EKG:  EKG is not ordered today.   Lab Results  Component Value Date   WBC 7.8 07/12/2014   HGB 11.2* 07/12/2014   HCT 32.9* 07/12/2014   PLT 377 07/12/2014   GLUCOSE 187* 07/15/2014   CHOL 123 08/11/2012   TRIG 84 08/11/2012   HDL 40 08/11/2012   LDLCALC 66 08/11/2012   ALT 13 02/03/2014   AST 20 02/03/2014   NA 136 07/15/2014   K 4.3 07/15/2014   CL 96* 07/15/2014   CREATININE 1.05* 07/15/2014   BUN 22* 07/15/2014   CO2 32 07/15/2014   TSH 0.720 06/14/2013   INR  1.02 03/05/2012   HGBA1C 9.9* 08/10/2012    BNP (last 3 results)  Recent Labs  07/12/14 1347  BNP 492.5*    ProBNP (last 3 results)  Recent Labs  12/29/13 1422 01/05/14 1404  PROBNP 812.3* 818.5*     Other Studies Reviewed Today:  2D echo 07-15-2014 Study Conclusions  - Left ventricle: Septal and inferior wall hypokinesis. The cavity size was mildly dilated. Wall thickness was increased in a pattern of mild LVH. Systolic function was mildly to moderately reduced. The estimated ejection fraction was in the range of 40% to 45%. Doppler parameters are consistent with elevated ventricular end-diastolic filling pressure. - Pericardium, extracardiac: small pericardial effusion no tamponade.  Assessment/Plan: 1. Chronic systolic heart failure - recent acute exacerbation due to stopping medicines - still not on her prescribed regimen but going to drug store today. She looks compensated - NYHA II - see back in 4 to 6 weeks at CHF clinic.   2. HTN - BP has improved.   3. Underlying ICD - followed by [name]  4. PAF - on beta blocker - to start Eliquis as well. Recheck surveillance labs on return.   5. Chronic anticoagulation - not started yet.  6. DM - uncontrolled by A1C  7. Noncompliance  8. Chronic pain syndrome - denied request for narcotics - will need to get thru her PCP.   Current medicines are reviewed with the patient today.  The patient does not have concerns regarding medicines other than what has been noted above.  The following changes have been made:  See above.  Labs/ tests ordered today include:   No orders of the defined types were placed in this encounter.     Disposition:   FU with Dr. Aundra Dubin in [facility] in 4 weeks.   Patient is agreeable to this plan and will call if any problems develop in the interim.   Signed: Burtis Junes, RN, ANP-C 07/25/2014 8:34 AM  [facility] [address]  [place], [place]  [address] Phone: [phone] Fax: [phone]

## 2014-07-25 NOTE — Patient Instructions (Addendum)
We will be checking the following labs today - NONE   Medication Instructions:    Continue with your current medicines.   Please pick up the prescriptions for Eliquis, Lipitor and Losartan at the drug store.   Follow this list of medicines    Testing/Procedures To Be Arranged:  N/A  Follow-Up:   See Dr. Aundra Dubin in 4 to 6 weeks in the CHF clinic at [place] with lab    Other Special Instructions:   N/A  Call the [place] office at [phone] if you have any questions, problems or concerns.

## 2014-07-29 ENCOUNTER — Other Ambulatory Visit: Payer: Self-pay | Admitting: Cardiology

## 2014-08-29 ENCOUNTER — Encounter (HOSPITAL_COMMUNITY): Payer: Medicare Other

## 2014-10-04 ENCOUNTER — Encounter (HOSPITAL_COMMUNITY): Payer: Self-pay | Admitting: Family Medicine

## 2014-10-04 ENCOUNTER — Emergency Department (HOSPITAL_COMMUNITY)
Admission: EM | Admit: 2014-10-04 | Discharge: 2014-10-04 | Disposition: A | Discharge status: Home / Self Care | Payer: Medicare Other | Attending: Emergency Medicine | Admitting: Emergency Medicine

## 2014-10-04 DIAGNOSIS — Z9119 Patient's noncompliance with other medical treatment and regimen: Secondary | ICD-10-CM | POA: Insufficient documentation

## 2014-10-04 DIAGNOSIS — Z8739 Personal history of other diseases of the musculoskeletal system and connective tissue: Secondary | ICD-10-CM | POA: Insufficient documentation

## 2014-10-04 DIAGNOSIS — I2510 Atherosclerotic heart disease of native coronary artery without angina pectoris: Secondary | ICD-10-CM | POA: Insufficient documentation

## 2014-10-04 DIAGNOSIS — G8929 Other chronic pain: Secondary | ICD-10-CM | POA: Diagnosis not present

## 2014-10-04 DIAGNOSIS — E1165 Type 2 diabetes mellitus with hyperglycemia: Secondary | ICD-10-CM | POA: Diagnosis not present

## 2014-10-04 DIAGNOSIS — Z87891 Personal history of nicotine dependence: Secondary | ICD-10-CM | POA: Diagnosis not present

## 2014-10-04 DIAGNOSIS — Z794 Long term (current) use of insulin: Secondary | ICD-10-CM | POA: Insufficient documentation

## 2014-10-04 DIAGNOSIS — Z862 Personal history of diseases of the blood and blood-forming organs and certain disorders involving the immune mechanism: Secondary | ICD-10-CM | POA: Diagnosis not present

## 2014-10-04 DIAGNOSIS — I5042 Chronic combined systolic (congestive) and diastolic (congestive) heart failure: Secondary | ICD-10-CM | POA: Diagnosis not present

## 2014-10-04 DIAGNOSIS — Z8709 Personal history of other diseases of the respiratory system: Secondary | ICD-10-CM | POA: Insufficient documentation

## 2014-10-04 DIAGNOSIS — Z79899 Other long term (current) drug therapy: Secondary | ICD-10-CM | POA: Diagnosis not present

## 2014-10-04 DIAGNOSIS — Z91199 Patient's noncompliance with other medical treatment and regimen due to unspecified reason: Secondary | ICD-10-CM

## 2014-10-04 LAB — CBC
HCT: 38.1 % (ref 36.0–46.0)
Hemoglobin: 13.1 g/dL (ref 12.0–15.0)
MCH: 30.2 pg (ref 26.0–34.0)
MCHC: 34.4 g/dL (ref 30.0–36.0)
MCV: 87.8 fL (ref 78.0–100.0)
Platelets: 355 10*3/uL (ref 150–400)
RBC: 4.34 MIL/uL (ref 3.87–5.11)
RDW: 13 % (ref 11.5–15.5)
WBC: 11 10*3/uL — ABNORMAL HIGH (ref 4.0–10.5)

## 2014-10-04 LAB — URINALYSIS, ROUTINE W REFLEX MICROSCOPIC
Bilirubin Urine: NEGATIVE
Glucose, UA: 500 mg/dL — AB
Hgb urine dipstick: NEGATIVE
Ketones, ur: NEGATIVE mg/dL
Leukocytes, UA: NEGATIVE
Nitrite: NEGATIVE
Protein, ur: NEGATIVE mg/dL
Specific Gravity, Urine: 1.018 (ref 1.005–1.030)
Urobilinogen, UA: 0.2 mg/dL (ref 0.0–1.0)
pH: 5 (ref 5.0–8.0)

## 2014-10-04 LAB — BASIC METABOLIC PANEL
Anion gap: 10 (ref 5–15)
BUN: 22 mg/dL — ABNORMAL HIGH (ref 6–20)
CO2: 30 mmol/L (ref 22–32)
Calcium: 9.6 mg/dL (ref 8.9–10.3)
Chloride: 94 mmol/L — ABNORMAL LOW (ref 101–111)
Creatinine, Ser: 1.01 mg/dL — ABNORMAL HIGH (ref 0.44–1.00)
GFR calc Af Amer: >60 mL/min (ref >60)
GFR calc non Af Amer: 59 mL/min — ABNORMAL LOW (ref >60)
Glucose, Bld: 339 mg/dL — ABNORMAL HIGH (ref 65–99)
Potassium: 4.3 mmol/L (ref 3.5–5.1)
Sodium: 134 mmol/L — ABNORMAL LOW (ref 135–145)

## 2014-10-04 LAB — CBG MONITORING, ED
Glucose-Capillary: 329 mg/dL — ABNORMAL HIGH (ref 65–99)
Glucose-Capillary: 231 mg/dL — ABNORMAL HIGH (ref 65–99)

## 2014-10-04 MED ORDER — INSULIN ASPART 100 UNIT/ML ~~LOC~~ SOLN
5.0000 [IU] | Freq: Once | SUBCUTANEOUS | Status: AC
  Administered 2014-10-04: 5 [IU] via SUBCUTANEOUS
  Filled 2014-10-04: qty 1

## 2014-10-04 MED ORDER — SODIUM CHLORIDE 0.9 % IV BOLUS (SEPSIS)
1000.0000 mL | Freq: Once | INTRAVENOUS | Status: AC
  Administered 2014-10-04: 1000 mL via INTRAVENOUS

## 2014-10-04 NOTE — ED Notes (Signed)
Traci, RN notified of POCT CBG results

## 2014-10-04 NOTE — ED Notes (Signed)
Pt here sts she thinks her blood sugar is up. sts blurred vision. sts she hasn't had her meds in a few days. sts unable to check at home.

## 2014-10-04 NOTE — ED Provider Notes (Signed)
CSN: [id number]     Arrival date & time 10/04/14  1203 History   First MD Initiated Contact with Patient 10/04/14 1336     Chief Complaint  Patient presents with  . Hyperglycemia     (Consider location/radiation/quality/duration/timing/severity/associated sxs/prior Treatment) HPI   64 year old female with history of insulin-dependent diabetes, who is noncompliant with medication, chronic low back pain, CAD, hypertension, anemia, hypercholesterolemia presents to ED for evaluation of blurred vision. Patient states for the past 3 days she has had polyuria, polydipsia, generalized fatigue, blurred vision, and headache with mild ulnar discomfort. She admits that she has misplaced the glucose monitoring machine for more than a week, and has ran out of her metformin for the past 3 days. She also has 2 other medication that she has not refilled but does not know which medication. She is here due to worsening blurred vision along with her constellation of symptoms. She denies any fever, chills, neck stiffness, chest pain, difficulty breathing, back pain, dysuria, or rash. She denies any focal numbness.       Past Medical History  Diagnosis Date  . Hypertension   . CAD (coronary artery disease), native coronary artery     a. Nonobstructive by cath 02/2012 (done because of low EF).  . Tobacco abuse   . Chronic combined systolic and diastolic CHF (congestive heart failure)     a. 03/05/12 echo:  LVEF 20-25%, moderate LVH , inferior and basal to mid septal akinesis, anterior moderate to severe hypokinesis and grade 2 diastolic dysfunction. b. EF 07/2012: EF still 25% (unclear medication compliance).  . Anemia     a. Noted on 07/2012 labs, instructed to f/u PCP.  Marland Kitchen LBBB (left bundle branch block)   . History of noncompliance with medical treatment   . High cholesterol   . Chronic bronchitis     "~ every other year" (11/18/2012)  . Orthopnea   . Type II diabetes mellitus   . Headache(784.0)      "often; maybe not daily" (11/18/2012)  . Arthritis     "joints" (11/18/2012)  . Chronic lower back pain   . Automatic implantable cardioverter-defibrillator in situ    Past Surgical History  Procedure Laterality Date  . Joint replacement      Bilateral hip and right knee  . Cardiac catheterization  03/04/12    nonobstructive CAD, elevated LVEDP and tortuous vessels suggestive of long-standing hypertension  . Bi-ventricular implantable cardioverter defibrillator  (crt-d)  11/18/2012  . Left heart cath N/A 03/05/2012    Procedure: LEFT HEART CATH;  Surgeon: Larey Dresser, MD;  Location: [facility] CATH LAB;  Service: Cardiovascular;  Laterality: N/A;  . Bi-ventricular implantable cardioverter defibrillator N/A 11/18/2012    Procedure: BI-VENTRICULAR IMPLANTABLE CARDIOVERTER DEFIBRILLATOR  (CRT-D);  Surgeon: Evans Lance, MD;  Location: [facility] CATH LAB;  Service: Cardiovascular;  Laterality: N/A;   Family History  Problem Relation Age of Onset  . Heart disease Neg Hx    Social History  Substance Use Topics  . Smoking status: Former Smoker -- 1.00 packs/day for 40 years    Types: Cigarettes    Start date: 06/23/1972    Quit date: 03/26/2012  . Smokeless tobacco: Never Used  . Alcohol Use: No   OB History    No data available     Review of Systems  All other systems reviewed and are negative.     Allergies  Morphine and related  Home Medications   Prior to Admission medications   Medication  Sig Start Date End Date Taking? Authorizing Provider  albuterol (PROVENTIL HFA;VENTOLIN HFA) 108 (90 BASE) MCG/ACT inhaler Inhale 2 puffs into the lungs every 6 (six) hours as needed for wheezing or shortness of breath.    Historical Provider, MD  apixaban (ELIQUIS) 5 MG TABS tablet Take 1 tablet (5 mg total) by mouth 2 (two) times daily. Patient not taking: Reported on 07/25/2014 07/15/14   Brittainy M Rosita Fire, PA-C  atorvastatin (LIPITOR) 40 MG tablet Take 1 tablet (40 mg total) by mouth daily.  07/25/14   Burtis Junes, NP  carvedilol (COREG) 12.5 MG tablet Take 12.5 mg by mouth 2 (two) times daily with a meal.    Historical Provider, MD  furosemide (LASIX) 40 MG tablet Take 1 tablet (40 mg total) by mouth 2 (two) times daily. 02/05/14   Debbe Odea, MD  Insulin Glargine (LANTUS SOLOSTAR) 100 UNIT/ML Solostar Pen Inject 50 Units into the skin at bedtime.    Historical Provider, MD  isosorbide-hydrALAZINE (BIDIL) 20-37.5 MG per tablet Take 2 tablets by mouth 3 (three) times daily. 07/15/14   Brittainy Erie Noe, PA-C  losartan (COZAAR) 25 MG tablet Take 1 tablet (25 mg total) by mouth 2 (two) times daily. 07/25/14   Burtis Junes, NP  losartan (COZAAR) 25 MG tablet TAKE 1 TABLET BY MOUTH TWICE DAILY 08/01/14   Brittainy Erie Noe, PA-C  metFORMIN (GLUCOPHAGE) 1000 MG tablet Take 1 tablet (1,000 mg total) by mouth 2 (two) times daily with a meal. HOLD for 2 days then resume on 11/21/2012 Patient taking differently: Take 1,000 mg by mouth 2 (two) times daily with a meal.  11/19/12   Brooke O Edmisten, PA-C  potassium chloride SA (K-DUR,KLOR-CON) 20 MEQ tablet Take 1 tablet (20 mEq total) by mouth 2 (two) times daily. 07/15/14   Brittainy Erie Noe, PA-C  rizatriptan (MAXALT) 5 MG tablet Take 5 mg by mouth 3 (three) times daily. 06/17/14   Historical Provider, MD  spironolactone (ALDACTONE) 25 MG tablet Take 1 tablet (25 mg total) by mouth daily. 07/15/14   Brittainy M Simmons, PA-C   BP 122/66 mmHg  Pulse 105  Temp(Src) 98.6 F (37 C) (Oral)  Resp 15  SpO2 98% Physical Exam  Constitutional: She is oriented to person, place, and time. She appears well-developed and well-nourished. No distress.  African-American female, chatting on the phone, in no acute discomfort.  HENT:  Head: Atraumatic.  Mouth/Throat: Oropharynx is clear and moist.  Eyes: Conjunctivae and EOM are normal. Pupils are equal, round, and reactive to light.  Neck: Normal range of motion. Neck supple.  No nuchal rigidity   Cardiovascular:  Tachycardia without murmurs rubs or gallops  Pulmonary/Chest: Effort normal and breath sounds normal.  Abdominal: Soft. Bowel sounds are normal. She exhibits no distension. There is no tenderness.  Neurological: She is alert and oriented to person, place, and time. She has normal strength. No cranial nerve deficit or sensory deficit. GCS eye subscore is 4. GCS verbal subscore is 5. GCS motor subscore is 6.  Skin: No rash noted.  Psychiatric: She has a normal mood and affect.  Nursing note and vitals reviewed.   ED Course  Procedures (including critical care time)  Patient here with constellation of symptoms consistence with hyperglycemia. She has mildly tachycardic.  She does not have any symptoms to suggest an underlying infection. She is afebrile. No stroke sxs.  mentating appropriately, no focal neuro deficits. She does have an elevated CBG 329 with normal anion gap and  no ketone in urine. Plan to provide a dose of subcutaneous insulin and IV fluid the patient likely will be discharged. She has no focal neuro deficit on exam. Time was spent encourage patient to be compliant with her medication.  3:56 PM CBG improved to 231 after patient received IV fluid and insulin. Vital signs improves as well. Patient will need to take the medication as prescribed. Return precautions discussed. Otherwise she is stable for discharge. Low suspicion for stroke, or other acute emergent medical condition. Care discussed with Dr. Mingo Amber.  Labs review  Labs Reviewed  BASIC METABOLIC PANEL - Abnormal; Notable for the following:    Sodium 134 (*)    Chloride 94 (*)    Glucose, Bld 339 (*)    BUN 22 (*)    Creatinine, Ser 1.01 (*)    GFR calc non Af Amer 59 (*)    All other components within normal limits  CBC - Abnormal; Notable for the following:    WBC 11.0 (*)    All other components within normal limits  URINALYSIS, ROUTINE W REFLEX MICROSCOPIC (NOT AT [facility]) - Abnormal; Notable for  the following:    APPearance HAZY (*)    Glucose, UA 500 (*)    All other components within normal limits  CBG MONITORING, ED - Abnormal; Notable for the following:    Glucose-Capillary 329 (*)    All other components within normal limits  CBG MONITORING, ED      MDM   Final diagnoses:  Hyperglycemia due to type 2 diabetes mellitus  Noncompliance with diabetes treatment    BP 127/109 mmHg  Pulse 100  Temp(Src) 98.1 F (36.7 C) (Oral)  Resp 18  SpO2 96%     Domenic Moras, PA-C 10/04/14 1557  Evelina Bucy, MD 10/04/14 1601

## 2014-10-04 NOTE — Discharge Instructions (Signed)
Please take your diabetic medication as prescribed.  Follow up with your doctor for further care.  Monitor your blood sugar closely.  Return to ER if your condition worsen or if you have other concerns.  Hyperglycemia Hyperglycemia occurs when the glucose (sugar) in your blood is too high. Hyperglycemia can happen for many reasons, but it most often happens to people who do not know they have diabetes or are not managing their diabetes properly.  CAUSES  Whether you have diabetes or not, there are other causes of hyperglycemia. Hyperglycemia can occur when you have diabetes, but it can also occur in other situations that you might not be as aware of, such as: Diabetes  If you have diabetes and are having problems controlling your blood glucose, hyperglycemia could occur because of some of the following reasons:  Not following your meal plan.  Not taking your diabetes medications or not taking it properly.  Exercising less or doing less activity than you normally do.  Being sick. Pre-diabetes  This cannot be ignored. Before people develop Type 2 diabetes, they almost always have "pre-diabetes." This is when your blood glucose levels are higher than normal, but not yet high enough to be diagnosed as diabetes. Research has shown that some long-term damage to the body, especially the heart and circulatory system, may already be occurring during pre-diabetes. If you take action to manage your blood glucose when you have pre-diabetes, you may delay or prevent Type 2 diabetes from developing. Stress  If you have diabetes, you may be "diet" controlled or on oral medications or insulin to control your diabetes. However, you may find that your blood glucose is higher than usual in the hospital whether you have diabetes or not. This is often referred to as "stress hyperglycemia." Stress can elevate your blood glucose. This happens because of hormones put out by the body during times of stress. If stress  has been the cause of your high blood glucose, it can be followed regularly by your caregiver. That way he/she can make sure your hyperglycemia does not continue to get worse or progress to diabetes. Steroids  Steroids are medications that act on the infection fighting system (immune system) to block inflammation or infection. One side effect can be a rise in blood glucose. Most people can produce enough extra insulin to allow for this rise, but for those who cannot, steroids make blood glucose levels go even higher. It is not unusual for steroid treatments to "uncover" diabetes that is developing. It is not always possible to determine if the hyperglycemia will go away after the steroids are stopped. A special blood test called an A1c is sometimes done to determine if your blood glucose was elevated before the steroids were started. SYMPTOMS  Thirsty.  Frequent urination.  Dry mouth.  Blurred vision.  Tired or fatigue.  Weakness.  Sleepy.  Tingling in feet or leg. DIAGNOSIS  Diagnosis is made by monitoring blood glucose in one or all of the following ways:  A1c test. This is a chemical found in your blood.  Fingerstick blood glucose monitoring.  Laboratory results. TREATMENT  First, knowing the cause of the hyperglycemia is important before the hyperglycemia can be treated. Treatment may include, but is not be limited to:  Education.  Change or adjustment in medications.  Change or adjustment in meal plan.  Treatment for an illness, infection, etc.  More frequent blood glucose monitoring.  Change in exercise plan.  Decreasing or stopping steroids.  Lifestyle changes.  HOME CARE INSTRUCTIONS   Test your blood glucose as directed.  Exercise regularly. Your caregiver will give you instructions about exercise. Pre-diabetes or diabetes which comes on with stress is helped by exercising.  Eat wholesome, balanced meals. Eat often and at regular, fixed times. Your  caregiver or nutritionist will give you a meal plan to guide your sugar intake.  Being at an ideal weight is important. If needed, losing as little as 10 to 15 pounds may help improve blood glucose levels. SEEK MEDICAL CARE IF:   You have questions about medicine, activity, or diet.  You continue to have symptoms (problems such as increased thirst, urination, or weight gain). SEEK IMMEDIATE MEDICAL CARE IF:   You are vomiting or have diarrhea.  Your breath smells fruity.  You are breathing faster or slower.  You are very sleepy or incoherent.  You have numbness, tingling, or pain in your feet or hands.  You have chest pain.  Your symptoms get worse even though you have been following your caregiver's orders.  If you have any other questions or concerns. Document Released: 07/09/2000 Document Revised: 04/07/2011 Document Reviewed: 05/12/2011 [facility] Patient Information 2015 [place], [place]. This information is not intended to replace advice given to you by your health care provider. Make sure you discuss any questions you have with your health care provider.

## 2014-10-09 ENCOUNTER — Telehealth: Payer: Self-pay | Admitting: Internal Medicine

## 2014-10-09 ENCOUNTER — Ambulatory Visit (HOSPITAL_COMMUNITY)
Admission: RE | Admit: 2014-10-09 | Discharge: 2014-10-09 | Disposition: A | Discharge status: Home / Self Care | Payer: Medicare Other | Source: Ambulatory Visit | Attending: Internal Medicine | Admitting: Internal Medicine

## 2014-10-09 ENCOUNTER — Encounter: Payer: Self-pay | Admitting: Internal Medicine

## 2014-10-09 ENCOUNTER — Encounter (HOSPITAL_COMMUNITY): Payer: Self-pay

## 2014-10-09 VITALS — BP 122/72 | HR 97 | Wt 227.8 lb

## 2014-10-09 DIAGNOSIS — I10 Essential (primary) hypertension: Secondary | ICD-10-CM | POA: Insufficient documentation

## 2014-10-09 DIAGNOSIS — J449 Chronic obstructive pulmonary disease, unspecified: Secondary | ICD-10-CM | POA: Insufficient documentation

## 2014-10-09 DIAGNOSIS — I2510 Atherosclerotic heart disease of native coronary artery without angina pectoris: Secondary | ICD-10-CM | POA: Diagnosis not present

## 2014-10-09 DIAGNOSIS — E785 Hyperlipidemia, unspecified: Secondary | ICD-10-CM | POA: Diagnosis not present

## 2014-10-09 DIAGNOSIS — J438 Other emphysema: Secondary | ICD-10-CM

## 2014-10-09 DIAGNOSIS — I5022 Chronic systolic (congestive) heart failure: Secondary | ICD-10-CM | POA: Insufficient documentation

## 2014-10-09 DIAGNOSIS — G4730 Sleep apnea, unspecified: Secondary | ICD-10-CM | POA: Diagnosis not present

## 2014-10-09 NOTE — Patient Instructions (Signed)
Your physician has recommended that you have a pulmonary function test. Pulmonary Function Tests are a group of tests that measure how well air moves in and out of your lungs.  Your physician has recommended that you have a sleep study. This test records several body functions during sleep, including: brain activity, eye movement, oxygen and carbon dioxide blood levels, heart rate and rhythm, breathing rate and rhythm, the flow of air through your mouth and nose, snoring, body muscle movements, and chest and belly movement.  You have been referred to follow up with Dr Cranford Mon in 2 weeks  Your physician recommends that you schedule a follow-up appointment in: 6 weeks

## 2014-10-10 NOTE — Progress Notes (Signed)
Patient ID: [name], female   DOB: 05-Nov-1950, 64 y.o.   MRN: [id number] PCP: Dr. Jonelle Sidle Cardiology: Dr Aundra Dubin  64 yo with history of nonischemic cardiomyopathy presents for cardiology followup.  She was admitted with CHF exacerbation in 02/2012.  EF 20-25% on echo, LHC with nonobstructive CAD.  She was started on cardiac meds and discharged.  In 07/2012, she was admitted again with CHF exacerbation.  She had run out of Lasix.  She was taking her other heart medications as ordered, however.  She was diuresed and discharged. She had a chronic LBBB, and Medtronic CRT-D device was placed in 10/14.    She was admitted in 6/16 with hypertensive emergency and CHF exacerbation.  She had been out of her medications for some time.  She was very volume overloaded and was diuresed.  Cardiac meds restarted.  Echo showed EF 40-45%.  Currenlty, she is stable.  She is short of breath walking up a flight of steps but is ok on flat ground.  No orthopnea/PND.  She had been on oxygen in the past but is not using it now.  She reports significant snoring and daytime fatigue.  She is out of Bidil and spironolactone (for 2-3 days).  She has had some blurring of her vision and is concerned that this is related to her diabetes.   ECG: a-sensed, BiV-paced  Optivol was assessed today: Fluid index is well below threshold with stable impedance.    Labs (2/14): SPEP negative, UPEP negative, HIV negative Labs (7/14): K 4, creatinine 1.0, LDL 66 Labs (08/25/12): K 4, creatinine 1.0 Labs (10/06/12) : K 3.3 creatinine 0.7 pro-bnp 188 Labs (11/15/12): K 4.4, creatinine 1.04, pro-BNP 468 Labs (3/15): K 4.3 Creatinine 0.83 Labs (5/15): K 4.1 Creatinine 0.85 Labs (9/16): K 4.3, creatinine 1.01, HCT 38.1  PMH: 1. HTN 2. Type II diabetes 3. Nonischemic cardiomyopathy: ? Due to HTN versus LBBB CMP.  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  Echo (2/14) with EF 20-25%.  Echo (7/14) with EF 25%, diffuse hypokinesis.  HIV, SPEP, UPEP  negative.  Has LBBB. CRT-D 10/2012 (Medtronic).  Echo (4/15) with EF 45-50%, mild diffuse hypokinesis, PA systolic pressure 38 mmHg.  Echo (6/16) with EF 40-45%, mild LVH, septal and inferior hypokinesis.   4. Chronic LBBB 5. Right TKR 6. Bilateral THR.  7. Hyperlipidemia 8. ?COPD: Has oxygen for use with exertion.  9. Atrial fibrillation: Paroxysmal.   SH: Prior smoker, quit 2/14.  Never drank ETOH.  No drugs. Lives with son.   FH: Mother with "heart trouble."   ROS: All systems reviewed and negative except as per HPI.   Current Outpatient Prescriptions  Medication Sig Dispense Refill  . albuterol (PROVENTIL HFA;VENTOLIN HFA) 108 (90 BASE) MCG/ACT inhaler Inhale 2 puffs into the lungs every 6 (six) hours as needed for wheezing or shortness of breath.    Marland Kitchen apixaban (ELIQUIS) 5 MG TABS tablet Take 1 tablet (5 mg total) by mouth 2 (two) times daily. 60 tablet 5  . atorvastatin (LIPITOR) 40 MG tablet Take 1 tablet (40 mg total) by mouth daily. 90 tablet 3  . carvedilol (COREG) 12.5 MG tablet Take 12.5 mg by mouth 2 (two) times daily with a meal.    . enalapril (VASOTEC) 10 MG tablet Take 10 mg by mouth 2 (two) times daily.    . furosemide (LASIX) 40 MG tablet Take 1 tablet (40 mg total) by mouth 2 (two) times daily. 60 tablet 6  . Insulin Glargine (LANTUS SOLOSTAR)  100 UNIT/ML Solostar Pen Inject 50 Units into the skin at bedtime.    . isosorbide-hydrALAZINE (BIDIL) 20-37.5 MG per tablet Take 2 tablets by mouth 3 (three) times daily. 180 tablet 5  . losartan (COZAAR) 25 MG tablet Take 1 tablet (25 mg total) by mouth 2 (two) times daily. 90 tablet 3  . metFORMIN (GLUCOPHAGE) 1000 MG tablet Take 1 tablet (1,000 mg total) by mouth 2 (two) times daily with a meal. HOLD for 2 days then resume on 11/21/2012 (Patient taking differently: Take 1,000 mg by mouth 2 (two) times daily with a meal. )    . potassium chloride SA (K-DUR,KLOR-CON) 20 MEQ tablet Take 1 tablet (20 mEq total) by mouth 2 (two)  times daily. 60 tablet 5  . rizatriptan (MAXALT) 5 MG tablet Take 5 mg by mouth 3 (three) times daily.  1  . spironolactone (ALDACTONE) 25 MG tablet Take 1 tablet (25 mg total) by mouth daily. 30 tablet 5   No current facility-administered medications for this encounter.    Filed Vitals:   10/09/14 1145  BP: 122/72  Pulse: 97  Weight: 227 lb 12 oz (103.307 kg)  SpO2: 98%   General: NAD Neck: JVP 7, no thyromegaly or thyroid nodule.  Lungs: Clear to auscultation bilaterally with normal respiratory effort. CV: Nondisplaced PMI.  Heart regular tachy S1/S2, no S3/S4, no murmur. No edema.  No carotid bruit.  Normal pedal pulses.  Abdomen: Soft, nontender, no hepatosplenomegaly, no distention.  Skin: Intact without lesions or rashes.  Neurologic: Alert and oriented x 3.   Psych: Normal affect. Extremities: No clubbing or cyanosis.   Assessment/Plan: 1. Chronic systolic CHF: Nonischemic cardiomyopathy. S/P CRT-D (Medtronic) placed 10/14. EF now 40-45% (6/16).  NYHA class II symptoms.  Volume looks ok by exam and Optivol.  She has not been particularly compliant with her meds over time.  - Volume looks ok, continue current Lasix.  - Restart Bidil and spironolactone. BMET/BNP in 2 wks.  - Continue Coreg and enalapril.   - Needs followup in device clinic.  2. Hyperlipidemia: On atorvastatin.   3. HTN: Currently controlled.  4. COPD: She is no longer using oxygen. - Walk in hall today to check oxygen saturation.  - I will arrange for PFTs.  5. Suspected OSA: I will arrange for sleep study.   [name] 10/10/2014

## 2014-10-13 ENCOUNTER — Ambulatory Visit (HOSPITAL_COMMUNITY)
Admission: RE | Admit: 2014-10-13 | Discharge: 2014-10-13 | Disposition: A | Discharge status: Home / Self Care | Payer: Medicare Other | Source: Ambulatory Visit | Attending: Cardiology | Admitting: Cardiology

## 2014-10-13 DIAGNOSIS — J438 Other emphysema: Secondary | ICD-10-CM | POA: Insufficient documentation

## 2014-10-13 LAB — PULMONARY FUNCTION TEST
DL/VA % pred: 69 %
DL/VA: 3.56 ml/min/mmHg/L
DLCO unc % pred: 41 %
DLCO unc: 11.75 ml/min/mmHg
FEF 25-75 Post: 1.69 L/sec
FEF 25-75 Pre: 1.72 L/sec
FEF2575-%Change-Post: -1 %
FEF2575-%Pred-Post: 76 %
FEF2575-%Pred-Pre: 77 %
FEV1-%Change-Post: 0 %
FEV1-%Pred-Post: 78 %
FEV1-%Pred-Pre: 77 %
FEV1-Post: 1.81 L
FEV1-Pre: 1.8 L
FEV1FVC-%Change-Post: 0 %
FEV1FVC-%Pred-Pre: 101 %
FEV6-%Change-Post: 0 %
FEV6-%Pred-Post: 78 %
FEV6-%Pred-Pre: 77 %
FEV6-Post: 2.24 L
FEV6-Pre: 2.22 L
FEV6FVC-%Pred-Post: 103 %
FEV6FVC-%Pred-Pre: 103 %
FVC-%Change-Post: 0 %
FVC-%Pred-Post: 75 %
FVC-%Pred-Pre: 76 %
FVC-Post: 2.24 L
FVC-Pre: 2.24 L
Post FEV1/FVC ratio: 81 %
Post FEV6/FVC ratio: 100 %
Pre FEV1/FVC ratio: 80 %
Pre FEV6/FVC Ratio: 100 %
RV % pred: 53 %
RV: 1.17 L
TLC % pred: 63 %
TLC: 3.47 L

## 2014-10-13 MED ORDER — ALBUTEROL SULFATE (2.5 MG/3ML) 0.083% IN NEBU
2.5000 mg | INHALATION_SOLUTION | Freq: Once | RESPIRATORY_TRACT | Status: AC
  Administered 2014-10-13: 2.5 mg via RESPIRATORY_TRACT

## 2014-10-20 ENCOUNTER — Telehealth (HOSPITAL_COMMUNITY): Payer: Self-pay | Admitting: *Deleted

## 2014-10-20 ENCOUNTER — Other Ambulatory Visit (HOSPITAL_COMMUNITY): Payer: Self-pay | Admitting: Cardiology

## 2014-10-20 DIAGNOSIS — R942 Abnormal results of pulmonary function studies: Secondary | ICD-10-CM

## 2014-10-20 NOTE — Telephone Encounter (Signed)
Notes Recorded by Scarlette Calico, RN on 10/20/2014 at 10:43 AM Pt aware and agreeable, will schedule CT and pulm and call her back with appointments

## 2014-10-20 NOTE — Telephone Encounter (Signed)
CT sch for 9/28 and pulm 10/6 with Dr Vaughan Browner

## 2014-10-20 NOTE — Progress Notes (Signed)
Opened in error

## 2014-10-20 NOTE — Telephone Encounter (Signed)
-----   Message from Larey Dresser, MD sent at 10/15/2014  9:21 PM EDT ----- Significant restrictive defect on PFTs.  Would get high resolution noncontrast CT of chest to rule out interstitial lung disease and refer to pulmonology.

## 2014-10-25 ENCOUNTER — Ambulatory Visit (HOSPITAL_COMMUNITY): Payer: Medicare Other

## 2014-10-25 ENCOUNTER — Ambulatory Visit (HOSPITAL_COMMUNITY)
Admission: RE | Admit: 2014-10-25 | Discharge: 2014-10-25 | Disposition: A | Discharge status: Home / Self Care | Payer: Medicare Other | Source: Ambulatory Visit | Attending: Cardiology | Admitting: Cardiology

## 2014-10-25 DIAGNOSIS — I5022 Chronic systolic (congestive) heart failure: Secondary | ICD-10-CM | POA: Insufficient documentation

## 2014-10-25 LAB — BASIC METABOLIC PANEL
Anion gap: 8 (ref 5–15)
BUN: 10 mg/dL (ref 6–20)
CO2: 29 mmol/L (ref 22–32)
Calcium: 9 mg/dL (ref 8.9–10.3)
Chloride: 101 mmol/L (ref 101–111)
Creatinine, Ser: 0.84 mg/dL (ref 0.44–1.00)
GFR calc Af Amer: >60 mL/min (ref >60)
GFR calc non Af Amer: >60 mL/min (ref >60)
Glucose, Bld: 285 mg/dL — ABNORMAL HIGH (ref 65–99)
Potassium: 3.9 mmol/L (ref 3.5–5.1)
Sodium: 138 mmol/L (ref 135–145)

## 2014-10-25 LAB — BRAIN NATRIURETIC PEPTIDE: B Natriuretic Peptide: 234.3 pg/mL — ABNORMAL HIGH (ref 0.0–100.0)

## 2014-10-31 ENCOUNTER — Encounter: Payer: Self-pay | Admitting: *Deleted

## 2014-11-02 ENCOUNTER — Institutional Professional Consult (permissible substitution): Payer: Medicare Other | Admitting: Pulmonary Disease

## 2014-11-20 ENCOUNTER — Telehealth: Payer: Self-pay | Admitting: Licensed Clinical Social Worker

## 2014-11-20 ENCOUNTER — Ambulatory Visit (HOSPITAL_COMMUNITY)
Admission: RE | Admit: 2014-11-20 | Discharge: 2014-11-20 | Disposition: A | Discharge status: Home / Self Care | Payer: Medicare Other | Source: Ambulatory Visit | Attending: Cardiology | Admitting: Cardiology

## 2014-11-20 VITALS — BP 128/72 | HR 84 | Wt 234.0 lb

## 2014-11-20 DIAGNOSIS — Z794 Long term (current) use of insulin: Secondary | ICD-10-CM | POA: Insufficient documentation

## 2014-11-20 DIAGNOSIS — I5022 Chronic systolic (congestive) heart failure: Secondary | ICD-10-CM | POA: Diagnosis not present

## 2014-11-20 DIAGNOSIS — I480 Paroxysmal atrial fibrillation: Secondary | ICD-10-CM | POA: Diagnosis not present

## 2014-11-20 DIAGNOSIS — Z87891 Personal history of nicotine dependence: Secondary | ICD-10-CM | POA: Insufficient documentation

## 2014-11-20 DIAGNOSIS — E119 Type 2 diabetes mellitus without complications: Secondary | ICD-10-CM | POA: Diagnosis not present

## 2014-11-20 DIAGNOSIS — Z79899 Other long term (current) drug therapy: Secondary | ICD-10-CM | POA: Diagnosis not present

## 2014-11-20 DIAGNOSIS — Z7984 Long term (current) use of oral hypoglycemic drugs: Secondary | ICD-10-CM | POA: Insufficient documentation

## 2014-11-20 DIAGNOSIS — M1990 Unspecified osteoarthritis, unspecified site: Secondary | ICD-10-CM | POA: Insufficient documentation

## 2014-11-20 DIAGNOSIS — E785 Hyperlipidemia, unspecified: Secondary | ICD-10-CM | POA: Diagnosis not present

## 2014-11-20 DIAGNOSIS — Z7902 Long term (current) use of antithrombotics/antiplatelets: Secondary | ICD-10-CM | POA: Insufficient documentation

## 2014-11-20 DIAGNOSIS — I428 Other cardiomyopathies: Secondary | ICD-10-CM | POA: Insufficient documentation

## 2014-11-20 DIAGNOSIS — I10 Essential (primary) hypertension: Secondary | ICD-10-CM | POA: Diagnosis not present

## 2014-11-20 DIAGNOSIS — J849 Interstitial pulmonary disease, unspecified: Secondary | ICD-10-CM | POA: Diagnosis not present

## 2014-11-20 DIAGNOSIS — Z95810 Presence of automatic (implantable) cardiac defibrillator: Secondary | ICD-10-CM | POA: Diagnosis not present

## 2014-11-20 LAB — CBC
HCT: 32.5 % — ABNORMAL LOW (ref 36.0–46.0)
Hemoglobin: 11.2 g/dL — ABNORMAL LOW (ref 12.0–15.0)
MCH: 31.3 pg (ref 26.0–34.0)
MCHC: 34.5 g/dL (ref 30.0–36.0)
MCV: 90.8 fL (ref 78.0–100.0)
Platelets: 389 10*3/uL (ref 150–400)
RBC: 3.58 MIL/uL — ABNORMAL LOW (ref 3.87–5.11)
RDW: 13 % (ref 11.5–15.5)
WBC: 8.2 10*3/uL (ref 4.0–10.5)

## 2014-11-20 LAB — BASIC METABOLIC PANEL
Anion gap: 8 (ref 5–15)
BUN: 11 mg/dL (ref 6–20)
CO2: 30 mmol/L (ref 22–32)
Calcium: 9.5 mg/dL (ref 8.9–10.3)
Chloride: 100 mmol/L — ABNORMAL LOW (ref 101–111)
Creatinine, Ser: 0.75 mg/dL (ref 0.44–1.00)
GFR calc Af Amer: >60 mL/min (ref >60)
GFR calc non Af Amer: >60 mL/min (ref >60)
Glucose, Bld: 218 mg/dL — ABNORMAL HIGH (ref 65–99)
Potassium: 3.7 mmol/L (ref 3.5–5.1)
Sodium: 138 mmol/L (ref 135–145)

## 2014-11-20 MED ORDER — TRAMADOL HCL 50 MG PO TABS: 50.0000 mg | ORAL_TABLET | Freq: Two times a day (BID) | ORAL | Status: DC

## 2014-11-20 MED ORDER — ISOSORB DINITRATE-HYDRALAZINE 20-37.5 MG PO TABS: 2.0000 | ORAL_TABLET | Freq: Three times a day (TID) | ORAL | Status: DC

## 2014-11-20 MED ORDER — ENALAPRIL MALEATE 10 MG PO TABS: 10.0000 mg | ORAL_TABLET | Freq: Every day | ORAL | Status: DC

## 2014-11-20 NOTE — Patient Instructions (Signed)
Stop Hydralazine Stop Losartan  Start Enalapril 10 daily  Start Bidil 2 tablets three times a day  Start Tramadol 50 mg twice day  Chest CT we will schedule  Pulmonary referral   Labs today will call if abnormal   Will get social work to call you about Primary Care  Follow up with device clinic  Follow up in 3 months

## 2014-11-20 NOTE — Progress Notes (Signed)
Patient ID: [name], female   DOB: 1950-11-26, 64 y.o.   MRN: [id number] PCP: Dr. Marlynn Perking Cardiology: Dr Aundra Dubin  64 yo with history of nonischemic cardiomyopathy presents for cardiology followup.  She was admitted with CHF exacerbation in 02/2012.  EF 20-25% on echo, LHC with nonobstructive CAD.  She was started on cardiac meds and discharged.  In 07/2012, she was admitted again with CHF exacerbation.  She had run out of Lasix.  She was taking her other heart medications as ordered, however.  She was diuresed and discharged. She had a chronic LBBB, and Medtronic CRT-D device was placed in 10/14.  She was admitted in 6/16 with hypertensive emergency and CHF exacerbation.  She had been out of her medications for some time.  She was very volume overloaded and was diuresed.  Cardiac meds restarted.  Echo showed EF 40-45%.    Main complaint currently is hip and knee pain.  She is status post bilateral THR and right TKR.  She no longer has a PCP to supply her pain meds.  She says that her breathing is doing ok. No exertional dyspnea though not very active due to pain.  No chest pain, no orthopnea or PND.  Weight is up 7 lbs.  Also of note, she had PFTs in 9/16 showing restrictive spirometry with low lung volume and DLCO.   She is a bit confused regarding her medications.  The HF pharmacist reviewed them at length with her today.   Optivol was assessed today: Fluid index is well below threshold with stable impedance.    Labs (2/14): SPEP negative, UPEP negative, HIV negative Labs (7/14): K 4, creatinine 1.0, LDL 66 Labs (08/25/12): K 4, creatinine 1.0 Labs (10/06/12) : K 3.3 creatinine 0.7 pro-bnp 188 Labs (11/15/12): K 4.4, creatinine 1.04, pro-BNP 468 Labs (3/15): K 4.3 Creatinine 0.83 Labs (5/15): K 4.1 Creatinine 0.85 Labs (9/16): K 4.3, creatinine 1.01 => 0.84, HCT 38.1, BNP 234  PMH: 1. HTN 2. Type II diabetes 3. Nonischemic cardiomyopathy: ? Due to HTN versus LBBB CMP.  LHC (2/14) with  nonobstructive CAD.  Echo (2/14) with EF 20-25%.  Echo (7/14) with EF 25%, diffuse hypokinesis.  HIV, SPEP, UPEP negative.  Has LBBB. CRT-D 10/2012 (Medtronic).  Echo (4/15) with EF 45-50%, mild diffuse hypokinesis, PA systolic pressure 38 mmHg.  Echo (6/16) with EF 40-45%, mild LVH, septal and inferior hypokinesis.   4. Chronic LBBB 5. Right TKR 6. Bilateral THR.  7. Hyperlipidemia 8. ?COPD: Has oxygen for use with exertion.  9. Atrial fibrillation: Paroxysmal.  10. PFTs (9/16) with FEV1 77%, FVC 76%, ratio 101%, TLC 63%, DLCO 41% => moderate restrictive deficit.   SH: Prior smoker, quit 2/14.  Never drank ETOH.  No drugs. Lives with son.   FH: Mother with "heart trouble."   ROS: All systems reviewed and negative except as per HPI.   Current Outpatient Prescriptions  Medication Sig Dispense Refill  . albuterol (PROVENTIL HFA;VENTOLIN HFA) 108 (90 BASE) MCG/ACT inhaler Inhale 2 puffs into the lungs every 6 (six) hours as needed for wheezing or shortness of breath.    Marland Kitchen apixaban (ELIQUIS) 5 MG TABS tablet Take 1 tablet (5 mg total) by mouth 2 (two) times daily. 60 tablet 5  . atorvastatin (LIPITOR) 40 MG tablet Take 1 tablet (40 mg total) by mouth daily. 90 tablet 3  . carvedilol (COREG) 12.5 MG tablet Take 12.5 mg by mouth 2 (two) times daily with a meal.    . furosemide (  LASIX) 40 MG tablet Take 1 tablet (40 mg total) by mouth 2 (two) times daily. 60 tablet 6  . Insulin Glargine (LANTUS SOLOSTAR) 100 UNIT/ML Solostar Pen Inject 50 Units into the skin at bedtime.    . isosorbide-hydrALAZINE (BIDIL) 20-37.5 MG tablet Take 2 tablets by mouth 3 (three) times daily. 180 tablet 5  . metFORMIN (GLUCOPHAGE) 1000 MG tablet Take 1,000 mg by mouth 2 (two) times daily with a meal.    . potassium chloride SA (K-DUR,KLOR-CON) 20 MEQ tablet Take 1 tablet (20 mEq total) by mouth 2 (two) times daily. 60 tablet 5  . rizatriptan (MAXALT) 5 MG tablet Take 5 mg by mouth 3 (three) times daily.  1  .  spironolactone (ALDACTONE) 25 MG tablet Take 1 tablet (25 mg total) by mouth daily. 30 tablet 5  . enalapril (VASOTEC) 10 MG tablet Take 1 tablet (10 mg total) by mouth daily. 30 tablet 3  . traMADol (ULTRAM) 50 MG tablet Take 1 tablet (50 mg total) by mouth 2 (two) times daily. 45 tablet 0   No current facility-administered medications for this encounter.    Filed Vitals:   11/20/14 0856  BP: 128/72  Pulse: 84  Weight: 234 lb (106.142 kg)  SpO2: 99%   General: NAD Neck: JVP 7, no thyromegaly or thyroid nodule.  Lungs: Clear to auscultation bilaterally with normal respiratory effort. CV: Nondisplaced PMI.  Heart regular tachy S1/S2, no S3/S4, no murmur. No edema.  No carotid bruit.  Normal pedal pulses.  Abdomen: Soft, nontender, no hepatosplenomegaly, no distention.  Skin: Intact without lesions or rashes.  Neurologic: Alert and oriented x 3.   Psych: Normal affect. Extremities: No clubbing or cyanosis.   Assessment/Plan: 1. Chronic systolic CHF: Nonischemic cardiomyopathy. S/P CRT-D (Medtronic) placed 10/14. EF now 40-45% (6/16).  NYHA class II symptoms.  Volume looks ok by exam and Optivol.  She is mainly limited by joint pain currently.  - Volume looks ok, continue current Lasix.  - Continue current Bidil, Coreg, and spironolactone. - There was some confusion about whether she is on enalapril or losartan.  It does appear that she is taking enalapril 10 mg bid. She will continue this, no losartan.  - BMET today.  - Needs followup in device clinic.  2. Hyperlipidemia: On atorvastatin.   3. HTN: Currently controlled.  4. Restrictive PFTs: Pattern is suggestive of a parenchymal problem, ?interstitial fibrosis.  I will get a high resolution CT of the chest and refer her to pulmonary.  5. Suspected OSA: Sleep study scheduled for November. 6. OA: Joint pain, no longer has PCP to provide pain meds.  I will give her 3 wks' worth of tramadol and will have our social worker help her find  a PCP.    [name] 11/20/2014

## 2014-11-20 NOTE — Telephone Encounter (Signed)
CSW received referral to assist patient with PCP. CSW contacted patient who states she has [place] healthcare and Medicaid. Patient state she had a PCP but is looking for a new one. CSW discussed options and provided number for [facility] per patient's request. Patient states she will call and discuss option for PCP provider. CSW available if needed. Raquel Sarna, [place]

## 2014-11-20 NOTE — Progress Notes (Signed)
Advanced Heart Failure Medication Review by a Pharmacist  Does the patient  feel that his/her medications are working for him/her?  yes  Has the patient been experiencing any side effects to the medications prescribed?  no  Does the patient measure his/her own blood pressure or blood glucose at home?  no   Does the patient have any problems obtaining medications due to transportation or finances?   no  Understanding of regimen: fair Understanding of indications: fair Potential of compliance: fair Patient understands to avoid NSAIDs. Patient understands to avoid decongestants.  Issues to address at subsequent visits: Compliance   Pharmacist comments:  Ms. Maya is a 64 yo F presenting today with a recent discharge medication list. She reports good compliance with her medications but is confused about what medications she actually has at home. I called [facility] pharmacy to verify last fill dates and it looks like she last filled a 90-day supply of losartan in July and also filled a 90-day supply of enalapril on 10/16. We have discussed discontinuing the losartan and just continuing the enalapril. She also last filled a 90-day supply of Bidil in June and hydralazine in September. We also discussed discontinuing the hydralazine and just taking the Bidil. I encouraged her to bring her medications with her to her next appointment so that we could verify what she is actually taking.  Ruta Hinds. Velva Harman, PharmD, BCPS, CPP Clinical Pharmacist Pager: [phone] Phone: [phone] 11/20/2014 9:24 AM    Time with patient: 8 minutes Preparation and documentation time: 10 minutes Total time: 18 minutes

## 2014-11-27 ENCOUNTER — Encounter: Payer: Self-pay | Admitting: Internal Medicine

## 2014-11-27 ENCOUNTER — Ambulatory Visit (INDEPENDENT_AMBULATORY_CARE_PROVIDER_SITE_OTHER): Payer: Medicare Other | Admitting: Internal Medicine

## 2014-11-27 VITALS — BP 150/90 | HR 79 | Ht 67.0 in | Wt 236.2 lb

## 2014-11-27 DIAGNOSIS — I110 Hypertensive heart disease with heart failure: Secondary | ICD-10-CM

## 2014-11-27 DIAGNOSIS — I5022 Chronic systolic (congestive) heart failure: Secondary | ICD-10-CM

## 2014-11-27 LAB — CUP PACEART INCLINIC DEVICE CHECK
Battery Remaining Longevity: 81 mo
Battery Voltage: 2.97 V
Brady Statistic AP VP Percent: 0.02 %
Brady Statistic AP VS Percent: 0.01 %
Brady Statistic AS VP Percent: 98.21 %
Brady Statistic AS VS Percent: 1.76 %
Brady Statistic RA Percent Paced: 0.03 %
Brady Statistic RV Percent Paced: 30.65 %
Date Time Interrogation Session: 20161031140556
HighPow Impedance: 63 Ohm
Implantable Lead Implant Date: 20141023
Implantable Lead Implant Date: 20141023
Implantable Lead Implant Date: 20141023
Implantable Lead Location: 753858
Implantable Lead Location: 753859
Implantable Lead Location: 753860
Implantable Lead Model: 4298
Implantable Lead Model: 5076
Implantable Lead Model: 6935
Lead Channel Impedance Value: 342 Ohm
Lead Channel Impedance Value: 342 Ohm
Lead Channel Impedance Value: 342 Ohm
Lead Channel Impedance Value: 361 Ohm
Lead Channel Impedance Value: 399 Ohm
Lead Channel Impedance Value: 418 Ohm
Lead Channel Impedance Value: 456 Ohm
Lead Channel Impedance Value: 532 Ohm
Lead Channel Impedance Value: 532 Ohm
Lead Channel Impedance Value: 532 Ohm
Lead Channel Impedance Value: 551 Ohm
Lead Channel Impedance Value: 589 Ohm
Lead Channel Impedance Value: 608 Ohm
Lead Channel Pacing Threshold Amplitude: 0.5 V
Lead Channel Pacing Threshold Amplitude: 0.5 V
Lead Channel Pacing Threshold Amplitude: 1 V
Lead Channel Pacing Threshold Pulse Width: 0.4 ms
Lead Channel Pacing Threshold Pulse Width: 0.4 ms
Lead Channel Pacing Threshold Pulse Width: 0.4 ms
Lead Channel Sensing Intrinsic Amplitude: 12.875 mV
Lead Channel Sensing Intrinsic Amplitude: 15.375 mV
Lead Channel Sensing Intrinsic Amplitude: 2.875 mV
Lead Channel Sensing Intrinsic Amplitude: 3.125 mV
Lead Channel Setting Pacing Amplitude: 1.5 V
Lead Channel Setting Pacing Amplitude: 2 V
Lead Channel Setting Pacing Pulse Width: 0.4 ms
Lead Channel Setting Sensing Sensitivity: 0.3 mV

## 2014-11-27 NOTE — Assessment & Plan Note (Signed)
Her blood pressure is not well controlled. I encouraged the patient to reduce her salt intake and increase her physical activity and to lose weight.

## 2014-11-27 NOTE — Assessment & Plan Note (Signed)
Her symptoms are class 2. She is back on her meds. She is encouraged to reduce her salt intake.

## 2014-11-27 NOTE — Progress Notes (Signed)
HPI The patient is a very pleasant 64 yo woman with a h/o chronic systolic heart failure, LBBB, EF 25%, s/p BiV ICD implantation approx. 12 months ago. She has done well since her BiV ICD implant. She notes pain in her legs when she walks, and she tells me this is what limits her. She does get sob as well when going up an incline. No other complaints. She denies sodium indiscretion. She was in the hospital several weeks ago as she ran out of her medications.  Allergies  Allergen Reactions  . Morphine And Related Nausea And Vomiting    Severe nausea     Current Outpatient Prescriptions  Medication Sig Dispense Refill  . albuterol (PROVENTIL HFA;VENTOLIN HFA) 108 (90 BASE) MCG/ACT inhaler Inhale 2 puffs into the lungs every 6 (six) hours as needed for wheezing or shortness of breath.    Marland Kitchen apixaban (ELIQUIS) 5 MG TABS tablet Take 1 tablet (5 mg total) by mouth 2 (two) times daily. 60 tablet 5  . atorvastatin (LIPITOR) 40 MG tablet Take 1 tablet (40 mg total) by mouth daily. 90 tablet 3  . carvedilol (COREG) 12.5 MG tablet Take 12.5 mg by mouth 2 (two) times daily with a meal.    . enalapril (VASOTEC) 10 MG tablet Take 1 tablet (10 mg total) by mouth daily. 30 tablet 3  . furosemide (LASIX) 40 MG tablet Take 1 tablet (40 mg total) by mouth 2 (two) times daily. 60 tablet 6  . Insulin Glargine (LANTUS SOLOSTAR) 100 UNIT/ML Solostar Pen Inject 50 Units into the skin at bedtime.    . isosorbide-hydrALAZINE (BIDIL) 20-37.5 MG tablet Take 2 tablets by mouth 3 (three) times daily. 180 tablet 5  . metFORMIN (GLUCOPHAGE) 1000 MG tablet Take 1,000 mg by mouth 2 (two) times daily with a meal.    . potassium chloride SA (K-DUR,KLOR-CON) 20 MEQ tablet Take 1 tablet (20 mEq total) by mouth 2 (two) times daily. 60 tablet 5  . rizatriptan (MAXALT) 5 MG tablet Take 5 mg by mouth 3 (three) times daily.  1  . spironolactone (ALDACTONE) 25 MG tablet Take 1 tablet (25 mg total) by mouth daily. 30 tablet 5    No current facility-administered medications for this visit.     Past Medical History  Diagnosis Date  . Hypertension   . CAD (coronary artery disease), native coronary artery     a. Nonobstructive by cath 02/2012 (done because of low EF).  . Tobacco abuse   . Chronic combined systolic and diastolic CHF (congestive heart failure) ([place])     a. 03/05/12 echo:  LVEF 20-25%, moderate LVH , inferior and basal to mid septal akinesis, anterior moderate to severe hypokinesis and grade 2 diastolic dysfunction. b. EF 07/2012: EF still 25% (unclear medication compliance).  . Anemia     a. Noted on 07/2012 labs, instructed to f/u PCP.  Marland Kitchen LBBB (left bundle branch block)   . History of noncompliance with medical treatment   . High cholesterol   . Chronic bronchitis ([place])     "~ every other year" (11/18/2012)  . Orthopnea   . Type II diabetes mellitus ([place])   . Headache(784.0)     "often; maybe not daily" (11/18/2012)  . Arthritis     "joints" (11/18/2012)  . Chronic lower back pain   . Automatic implantable cardioverter-defibrillator in situ     ROS:   All systems reviewed and negative except as noted in the HPI.  Past Surgical History  Procedure Laterality Date  . Joint replacement      Bilateral hip and right knee  . Cardiac catheterization  03/04/12    nonobstructive CAD, elevated LVEDP and tortuous vessels suggestive of long-standing hypertension  . Bi-ventricular implantable cardioverter defibrillator  (crt-d)  11/18/2012  . Left heart cath N/A 03/05/2012    Procedure: LEFT HEART CATH;  Surgeon: Larey Dresser, MD;  Location: [facility] CATH LAB;  Service: Cardiovascular;  Laterality: N/A;  . Bi-ventricular implantable cardioverter defibrillator N/A 11/18/2012    Procedure: BI-VENTRICULAR IMPLANTABLE CARDIOVERTER DEFIBRILLATOR  (CRT-D);  Surgeon: Evans Lance, MD;  Location: [facility] CATH LAB;  Service: Cardiovascular;  Laterality: N/A;     Family History  Problem Relation Age of Onset  .  Heart disease Neg Hx      Social History   Social History  . Marital Status: Single    Spouse Name: N/A  . Number of Children: N/A  . Years of Education: N/A   Occupational History  . Not on file.   Social History Main Topics  . Smoking status: Former Smoker -- 1.00 packs/day for 40 years    Types: Cigarettes    Start date: 06/23/1972    Quit date: 03/26/2012  . Smokeless tobacco: Never Used  . Alcohol Use: No  . Drug Use: No  . Sexual Activity: Yes   Other Topics Concern  . Not on file   Social History Narrative     BP 150/90 mmHg  Pulse 79  Ht 5\' 7"  (1.702 m)  Wt 236 lb 3.2 oz (107.14 kg)  BMI 36.99 kg/m2  Physical Exam:  Well appearing middle age woman, overweight, NAD HEENT: Unremarkable Neck:  No JVD, no thyromegally Back:  No CVA tenderness Lungs:  Clear with no wheezes HEART:  Regular rate rhythm, no murmurs, no rubs, no clicks Abd:  soft, positive bowel sounds, no organomegally, no rebound, no guarding Ext:  2 plus pulses, no edema, no cyanosis, no clubbing Skin:  No rashes no nodules Neuro:  CN II through XII intact, motor grossly intact  EKG - nsr with BiV pacing  DEVICE  Normal device function.  See PaceArt for details.   Assess/Plan:

## 2014-11-27 NOTE — Patient Instructions (Signed)
Medication Instructions:  Your physician recommends that you continue on your current medications as directed. Please refer to the Current Medication list given to you today.   Labwork: None ordered   Testing/Procedures: None ordered   Follow-Up: Your physician wants you to follow-up in: 12 months with Dr Knox Saliva will receive a reminder letter in the mail two months in advance. If you don't receive a letter, please call our office to schedule the follow-up appointment.  Remote monitoring is used to monitor your ICD from home. This monitoring reduces the number of office visits required to check your device to one time per year. It allows [place] to keep an eye on the functioning of your device to ensure it is working properly. You are scheduled for a device check from home on 02/26/15. You may send your transmission at any time that day. If you have a wireless device, the transmission will be sent automatically. After your physician reviews your transmission, you will receive a postcard with your next transmission date.    Any Other Special Instructions Will Be Listed Below (If Applicable).     If you need a refill on your cardiac medications before your next appointment, please call your pharmacy.  Your physician recommends that you continue on your current medications as directed. Please refer to the Current Medication list given to you today.

## 2014-11-28 ENCOUNTER — Ambulatory Visit (HOSPITAL_COMMUNITY): Admission: RE | Admit: 2014-11-28 | Payer: Medicare Other | Source: Ambulatory Visit

## 2014-12-04 ENCOUNTER — Encounter: Payer: Self-pay | Admitting: Internal Medicine

## 2014-12-12 ENCOUNTER — Encounter: Payer: Self-pay | Admitting: Cardiology

## 2014-12-14 ENCOUNTER — Encounter (HOSPITAL_COMMUNITY): Payer: Self-pay | Admitting: *Deleted

## 2014-12-14 NOTE — Progress Notes (Signed)
Per Dr Aundra Dubin pt needs High Resolution CT of chest for abn pfts to r/o interstitial lung dz.  Pt was originally scheduled to have this done on 10/25/14 and no showed for that appointment.  Pt was then rescheduled for 11/28/14 however pt no showed again.  Letter mailed to pt to call back to reschedule

## 2014-12-26 ENCOUNTER — Ambulatory Visit (HOSPITAL_BASED_OUTPATIENT_CLINIC_OR_DEPARTMENT_OTHER): Payer: Medicare Other

## 2014-12-28 ENCOUNTER — Ambulatory Visit (HOSPITAL_COMMUNITY): Payer: Medicare Other

## 2015-01-12 ENCOUNTER — Encounter: Payer: Medicare Other | Admitting: Internal Medicine

## 2015-02-28 ENCOUNTER — Ambulatory Visit (INDEPENDENT_AMBULATORY_CARE_PROVIDER_SITE_OTHER): Payer: Medicare Other | Admitting: *Deleted

## 2015-02-28 DIAGNOSIS — I428 Other cardiomyopathies: Secondary | ICD-10-CM

## 2015-02-28 DIAGNOSIS — I5022 Chronic systolic (congestive) heart failure: Secondary | ICD-10-CM | POA: Diagnosis not present

## 2015-02-28 DIAGNOSIS — I429 Cardiomyopathy, unspecified: Secondary | ICD-10-CM | POA: Diagnosis not present

## 2015-02-28 NOTE — Progress Notes (Signed)
Remote ICD transmission.   

## 2015-03-07 ENCOUNTER — Encounter (HOSPITAL_BASED_OUTPATIENT_CLINIC_OR_DEPARTMENT_OTHER): Payer: Medicare Other

## 2015-03-16 ENCOUNTER — Encounter: Payer: Self-pay | Admitting: Cardiology

## 2015-03-16 LAB — CUP PACEART REMOTE DEVICE CHECK
Battery Remaining Longevity: 79 mo
Battery Voltage: 2.97 V
Brady Statistic AP VP Percent: 0.02 %
Brady Statistic AP VS Percent: 0.01 %
Brady Statistic AS VP Percent: 98.53 %
Brady Statistic AS VS Percent: 1.45 %
Brady Statistic RA Percent Paced: 0.03 %
Brady Statistic RV Percent Paced: 30.81 %
Date Time Interrogation Session: 20170201062408
HighPow Impedance: 67 Ohm
Implantable Lead Implant Date: 20141023
Implantable Lead Implant Date: 20141023
Implantable Lead Implant Date: 20141023
Implantable Lead Location: 753858
Implantable Lead Location: 753859
Implantable Lead Location: 753860
Implantable Lead Model: 4298
Implantable Lead Model: 5076
Implantable Lead Model: 6935
Lead Channel Impedance Value: 342 Ohm
Lead Channel Impedance Value: 361 Ohm
Lead Channel Impedance Value: 361 Ohm
Lead Channel Impedance Value: 399 Ohm
Lead Channel Impedance Value: 418 Ohm
Lead Channel Impedance Value: 456 Ohm
Lead Channel Impedance Value: 513 Ohm
Lead Channel Impedance Value: 551 Ohm
Lead Channel Impedance Value: 589 Ohm
Lead Channel Impedance Value: 589 Ohm
Lead Channel Impedance Value: 608 Ohm
Lead Channel Impedance Value: 646 Ohm
Lead Channel Impedance Value: 646 Ohm
Lead Channel Pacing Threshold Amplitude: 0.375 V
Lead Channel Pacing Threshold Amplitude: 0.5 V
Lead Channel Pacing Threshold Amplitude: 0.875 V
Lead Channel Pacing Threshold Pulse Width: 0.4 ms
Lead Channel Pacing Threshold Pulse Width: 0.4 ms
Lead Channel Pacing Threshold Pulse Width: 0.4 ms
Lead Channel Sensing Intrinsic Amplitude: 13.125 mV
Lead Channel Sensing Intrinsic Amplitude: 13.125 mV
Lead Channel Sensing Intrinsic Amplitude: 3.375 mV
Lead Channel Sensing Intrinsic Amplitude: 3.375 mV
Lead Channel Setting Pacing Amplitude: 1.5 V
Lead Channel Setting Pacing Amplitude: 2 V
Lead Channel Setting Pacing Pulse Width: 0.4 ms
Lead Channel Setting Sensing Sensitivity: 0.3 mV

## 2015-03-23 ENCOUNTER — Ambulatory Visit (INDEPENDENT_AMBULATORY_CARE_PROVIDER_SITE_OTHER): Payer: Medicare Other | Admitting: *Deleted

## 2015-03-23 ENCOUNTER — Encounter: Payer: Self-pay | Admitting: Internal Medicine

## 2015-03-23 DIAGNOSIS — I429 Cardiomyopathy, unspecified: Secondary | ICD-10-CM | POA: Diagnosis not present

## 2015-03-23 DIAGNOSIS — I428 Other cardiomyopathies: Secondary | ICD-10-CM

## 2015-03-23 DIAGNOSIS — I5022 Chronic systolic (congestive) heart failure: Secondary | ICD-10-CM | POA: Diagnosis not present

## 2015-03-23 LAB — CUP PACEART INCLINIC DEVICE CHECK
Battery Remaining Longevity: 77 mo
Battery Voltage: 2.98 V
Brady Statistic AP VP Percent: 0.02 %
Brady Statistic AP VS Percent: 0.01 %
Brady Statistic AS VP Percent: 98.41 %
Brady Statistic AS VS Percent: 1.56 %
Brady Statistic RA Percent Paced: 0.03 %
Brady Statistic RV Percent Paced: 29.79 %
Date Time Interrogation Session: 20170224153325
HighPow Impedance: 67 Ohm
Implantable Lead Implant Date: 20141023
Implantable Lead Implant Date: 20141023
Implantable Lead Implant Date: 20141023
Implantable Lead Location: 753858
Implantable Lead Location: 753859
Implantable Lead Location: 753860
Implantable Lead Model: 4298
Implantable Lead Model: 5076
Implantable Lead Model: 6935
Lead Channel Impedance Value: 342 Ohm
Lead Channel Impedance Value: 399 Ohm
Lead Channel Impedance Value: 399 Ohm
Lead Channel Impedance Value: 399 Ohm
Lead Channel Impedance Value: 418 Ohm
Lead Channel Impedance Value: 456 Ohm
Lead Channel Impedance Value: 513 Ohm
Lead Channel Impedance Value: 551 Ohm
Lead Channel Impedance Value: 646 Ohm
Lead Channel Impedance Value: 646 Ohm
Lead Channel Impedance Value: 646 Ohm
Lead Channel Impedance Value: 646 Ohm
Lead Channel Impedance Value: 703 Ohm
Lead Channel Pacing Threshold Amplitude: 0.375 V
Lead Channel Pacing Threshold Amplitude: 0.5 V
Lead Channel Pacing Threshold Amplitude: 0.875 V
Lead Channel Pacing Threshold Pulse Width: 0.4 ms
Lead Channel Pacing Threshold Pulse Width: 0.4 ms
Lead Channel Pacing Threshold Pulse Width: 0.4 ms
Lead Channel Sensing Intrinsic Amplitude: 13 mV
Lead Channel Sensing Intrinsic Amplitude: 15.125 mV
Lead Channel Sensing Intrinsic Amplitude: 2.875 mV
Lead Channel Sensing Intrinsic Amplitude: 4.25 mV
Lead Channel Setting Pacing Amplitude: 1.5 V
Lead Channel Setting Pacing Amplitude: 2 V
Lead Channel Setting Pacing Amplitude: 2.5 V
Lead Channel Setting Pacing Pulse Width: 0.4 ms
Lead Channel Setting Pacing Pulse Width: 0.4 ms
Lead Channel Setting Sensing Sensitivity: 0.45 mV

## 2015-03-23 NOTE — Progress Notes (Signed)
CRT-D device check in office. Thresholds and sensing consistent with previous device measurements. Lead impedance trends stable over time. No mode switch episodes recorded. (25) "NSVT" episodes recorded---TWOS per EGMs. (9) TWOS episodes. Patient bi-ventricularly pacing 94.7% of the time with 1.1% as VSRp. Device programmed with appropriate safety margins. Heart failure diagnostics reviewed and trends have been abnormal since 1/30---no sx's. RV sensitivity decreased from 0.47mV to 0.67mV.  Estimated longevity 6.4 years.  Patient will follow up via [facility] on 5/30 and with [facility] in 10-2015.

## 2015-03-28 ENCOUNTER — Institutional Professional Consult (permissible substitution): Payer: Medicare Other | Admitting: Internal Medicine

## 2015-04-02 ENCOUNTER — Ambulatory Visit (HOSPITAL_BASED_OUTPATIENT_CLINIC_OR_DEPARTMENT_OTHER): Payer: Medicare Other

## 2015-04-24 ENCOUNTER — Encounter (HOSPITAL_BASED_OUTPATIENT_CLINIC_OR_DEPARTMENT_OTHER): Payer: Medicare Other

## 2015-05-11 ENCOUNTER — Ambulatory Visit (HOSPITAL_BASED_OUTPATIENT_CLINIC_OR_DEPARTMENT_OTHER): Payer: Medicare Other | Attending: Cardiology

## 2015-05-30 ENCOUNTER — Encounter: Payer: Medicare Other | Admitting: *Deleted

## 2015-06-01 ENCOUNTER — Encounter: Payer: Self-pay | Admitting: Cardiology

## 2015-07-19 ENCOUNTER — Emergency Department (HOSPITAL_COMMUNITY): Payer: Medicare Other

## 2015-07-19 ENCOUNTER — Other Ambulatory Visit: Payer: Self-pay

## 2015-07-19 ENCOUNTER — Encounter (HOSPITAL_COMMUNITY): Payer: Self-pay | Admitting: Emergency Medicine

## 2015-07-19 ENCOUNTER — Observation Stay (HOSPITAL_COMMUNITY)
Admission: EM | Admit: 2015-07-19 | Discharge: 2015-07-20 | Disposition: A | Discharge status: Home / Self Care | Payer: Medicare Other | Attending: Cardiology | Admitting: Cardiology

## 2015-07-19 DIAGNOSIS — I10 Essential (primary) hypertension: Secondary | ICD-10-CM | POA: Diagnosis present

## 2015-07-19 DIAGNOSIS — I110 Hypertensive heart disease with heart failure: Secondary | ICD-10-CM | POA: Diagnosis not present

## 2015-07-19 DIAGNOSIS — I5043 Acute on chronic combined systolic (congestive) and diastolic (congestive) heart failure: Secondary | ICD-10-CM | POA: Insufficient documentation

## 2015-07-19 DIAGNOSIS — R0600 Dyspnea, unspecified: Secondary | ICD-10-CM | POA: Diagnosis present

## 2015-07-19 DIAGNOSIS — Z794 Long term (current) use of insulin: Secondary | ICD-10-CM | POA: Insufficient documentation

## 2015-07-19 DIAGNOSIS — E119 Type 2 diabetes mellitus without complications: Secondary | ICD-10-CM | POA: Diagnosis not present

## 2015-07-19 DIAGNOSIS — Z96651 Presence of right artificial knee joint: Secondary | ICD-10-CM | POA: Insufficient documentation

## 2015-07-19 DIAGNOSIS — Z7901 Long term (current) use of anticoagulants: Secondary | ICD-10-CM | POA: Insufficient documentation

## 2015-07-19 DIAGNOSIS — M1990 Unspecified osteoarthritis, unspecified site: Secondary | ICD-10-CM | POA: Diagnosis not present

## 2015-07-19 DIAGNOSIS — Z950 Presence of cardiac pacemaker: Secondary | ICD-10-CM | POA: Insufficient documentation

## 2015-07-19 DIAGNOSIS — Z87891 Personal history of nicotine dependence: Secondary | ICD-10-CM | POA: Diagnosis not present

## 2015-07-19 DIAGNOSIS — Z96643 Presence of artificial hip joint, bilateral: Secondary | ICD-10-CM | POA: Diagnosis not present

## 2015-07-19 DIAGNOSIS — Z79899 Other long term (current) drug therapy: Secondary | ICD-10-CM | POA: Diagnosis not present

## 2015-07-19 DIAGNOSIS — Z9119 Patient's noncompliance with other medical treatment and regimen: Secondary | ICD-10-CM | POA: Insufficient documentation

## 2015-07-19 DIAGNOSIS — I160 Hypertensive urgency: Secondary | ICD-10-CM | POA: Diagnosis not present

## 2015-07-19 DIAGNOSIS — I447 Left bundle-branch block, unspecified: Secondary | ICD-10-CM | POA: Diagnosis not present

## 2015-07-19 DIAGNOSIS — R079 Chest pain, unspecified: Secondary | ICD-10-CM

## 2015-07-19 DIAGNOSIS — E785 Hyperlipidemia, unspecified: Secondary | ICD-10-CM | POA: Diagnosis not present

## 2015-07-19 DIAGNOSIS — E7800 Pure hypercholesterolemia, unspecified: Secondary | ICD-10-CM | POA: Insufficient documentation

## 2015-07-19 DIAGNOSIS — I2510 Atherosclerotic heart disease of native coronary artery without angina pectoris: Secondary | ICD-10-CM | POA: Insufficient documentation

## 2015-07-19 DIAGNOSIS — I429 Cardiomyopathy, unspecified: Secondary | ICD-10-CM | POA: Diagnosis not present

## 2015-07-19 DIAGNOSIS — I480 Paroxysmal atrial fibrillation: Secondary | ICD-10-CM | POA: Diagnosis not present

## 2015-07-19 LAB — CBC
HCT: 35.8 % — ABNORMAL LOW (ref 36.0–46.0)
HCT: 34.3 % — ABNORMAL LOW (ref 36.0–46.0)
Hemoglobin: 11.8 g/dL — ABNORMAL LOW (ref 12.0–15.0)
Hemoglobin: 11.6 g/dL — ABNORMAL LOW (ref 12.0–15.0)
MCH: 29.3 pg (ref 26.0–34.0)
MCH: 30.3 pg (ref 26.0–34.0)
MCHC: 33 g/dL (ref 30.0–36.0)
MCHC: 33.8 g/dL (ref 30.0–36.0)
MCV: 88.8 fL (ref 78.0–100.0)
MCV: 89.6 fL (ref 78.0–100.0)
Platelets: 399 10*3/uL (ref 150–400)
Platelets: 370 10*3/uL (ref 150–400)
RBC: 4.03 MIL/uL (ref 3.87–5.11)
RBC: 3.83 MIL/uL — ABNORMAL LOW (ref 3.87–5.11)
RDW: 12.7 % (ref 11.5–15.5)
RDW: 12.8 % (ref 11.5–15.5)
WBC: 8.3 10*3/uL (ref 4.0–10.5)
WBC: 9.2 10*3/uL (ref 4.0–10.5)

## 2015-07-19 LAB — BASIC METABOLIC PANEL
Anion gap: 10 (ref 5–15)
BUN: 11 mg/dL (ref 6–20)
CO2: 27 mmol/L (ref 22–32)
Calcium: 9.5 mg/dL (ref 8.9–10.3)
Chloride: 99 mmol/L — ABNORMAL LOW (ref 101–111)
Creatinine, Ser: 0.8 mg/dL (ref 0.44–1.00)
GFR calc Af Amer: >60 mL/min (ref >60)
GFR calc non Af Amer: >60 mL/min (ref >60)
Glucose, Bld: 365 mg/dL — ABNORMAL HIGH (ref 65–99)
Potassium: 4 mmol/L (ref 3.5–5.1)
Sodium: 136 mmol/L (ref 135–145)

## 2015-07-19 LAB — GLUCOSE, CAPILLARY
Glucose-Capillary: 286 mg/dL — ABNORMAL HIGH (ref 65–99)
Glucose-Capillary: 166 mg/dL — ABNORMAL HIGH (ref 65–99)

## 2015-07-19 LAB — BRAIN NATRIURETIC PEPTIDE: B Natriuretic Peptide: 497.8 pg/mL — ABNORMAL HIGH (ref 0.0–100.0)

## 2015-07-19 LAB — TROPONIN I
Troponin I: <0.03 ng/mL (ref <0.031)
Troponin I: <0.03 ng/mL (ref <0.031)

## 2015-07-19 LAB — CBG MONITORING, ED
Glucose-Capillary: 327 mg/dL — ABNORMAL HIGH (ref 65–99)
Glucose-Capillary: 215 mg/dL — ABNORMAL HIGH (ref 65–99)

## 2015-07-19 LAB — URINALYSIS, ROUTINE W REFLEX MICROSCOPIC
Bilirubin Urine: NEGATIVE
Glucose, UA: >1000 mg/dL — AB
Hgb urine dipstick: NEGATIVE
Ketones, ur: NEGATIVE mg/dL
Leukocytes, UA: NEGATIVE
Nitrite: NEGATIVE
Protein, ur: 30 mg/dL — AB
Specific Gravity, Urine: 1.03 (ref 1.005–1.030)
pH: 5.5 (ref 5.0–8.0)

## 2015-07-19 LAB — URINE MICROSCOPIC-ADD ON: RBC / HPF: NONE SEEN RBC/hpf (ref 0–5)

## 2015-07-19 LAB — CREATININE, SERUM
Creatinine, Ser: 0.72 mg/dL (ref 0.44–1.00)
GFR calc Af Amer: >60 mL/min (ref >60)
GFR calc non Af Amer: >60 mL/min (ref >60)

## 2015-07-19 LAB — PROTIME-INR
INR: 1.44 (ref 0.00–1.49)
Prothrombin Time: 17.7 seconds — ABNORMAL HIGH (ref 11.6–15.2)

## 2015-07-19 LAB — I-STAT TROPONIN, ED
Troponin i, poc: 0.01 ng/mL (ref 0.00–0.08)
Troponin i, poc: 0.02 ng/mL (ref 0.00–0.08)

## 2015-07-19 MED ORDER — ASPIRIN EC 81 MG PO TBEC
81.0000 mg | DELAYED_RELEASE_TABLET | Freq: Every day | ORAL | Status: DC
  Administered 2015-07-19 – 2015-07-20 (×2): 81 mg via ORAL
  Filled 2015-07-19 (×2): qty 1

## 2015-07-19 MED ORDER — ONDANSETRON HCL 4 MG/2ML IJ SOLN: 4.0000 mg | Freq: Four times a day (QID) | INTRAMUSCULAR | Status: DC | PRN

## 2015-07-19 MED ORDER — ZOLPIDEM TARTRATE 5 MG PO TABS
5.0000 mg | ORAL_TABLET | Freq: Once | ORAL | Status: AC
  Administered 2015-07-19: 5 mg via ORAL
  Filled 2015-07-19: qty 1

## 2015-07-19 MED ORDER — ATORVASTATIN CALCIUM 40 MG PO TABS
40.0000 mg | ORAL_TABLET | Freq: Every day | ORAL | Status: DC
  Administered 2015-07-19 – 2015-07-20 (×2): 40 mg via ORAL
  Filled 2015-07-19 (×2): qty 1

## 2015-07-19 MED ORDER — ACETAMINOPHEN 325 MG PO TABS: 650.0000 mg | ORAL_TABLET | ORAL | Status: DC | PRN

## 2015-07-19 MED ORDER — SODIUM CHLORIDE 0.9% FLUSH: 3.0000 mL | INTRAVENOUS | Status: DC | PRN

## 2015-07-19 MED ORDER — SODIUM CHLORIDE 0.9% FLUSH
3.0000 mL | Freq: Two times a day (BID) | INTRAVENOUS | Status: DC
  Administered 2015-07-19 – 2015-07-20 (×3): 3 mL via INTRAVENOUS

## 2015-07-19 MED ORDER — SODIUM CHLORIDE 0.9 % IV SOLN: 250.0000 mL | INTRAVENOUS | Status: DC | PRN

## 2015-07-19 MED ORDER — ENALAPRIL MALEATE 10 MG PO TABS
10.0000 mg | ORAL_TABLET | Freq: Every day | ORAL | Status: DC
Start: 2015-07-19 — End: 2015-07-20
  Administered 2015-07-19 – 2015-07-20 (×2): 10 mg via ORAL
  Filled 2015-07-19 (×2): qty 1

## 2015-07-19 MED ORDER — INSULIN GLARGINE 100 UNIT/ML ~~LOC~~ SOLN
50.0000 [IU] | Freq: Every day | SUBCUTANEOUS | Status: DC
  Administered 2015-07-19: 50 [IU] via SUBCUTANEOUS
  Filled 2015-07-19 (×2): qty 0.5

## 2015-07-19 MED ORDER — FUROSEMIDE 10 MG/ML IJ SOLN
40.0000 mg | Freq: Two times a day (BID) | INTRAMUSCULAR | Status: DC
  Administered 2015-07-19 – 2015-07-20 (×2): 40 mg via INTRAVENOUS
  Filled 2015-07-19 (×2): qty 4

## 2015-07-19 MED ORDER — CARVEDILOL 12.5 MG PO TABS
12.5000 mg | ORAL_TABLET | Freq: Two times a day (BID) | ORAL | Status: DC
  Administered 2015-07-19 – 2015-07-20 (×2): 12.5 mg via ORAL
  Filled 2015-07-19 (×2): qty 1

## 2015-07-19 MED ORDER — HYDRALAZINE HCL 20 MG/ML IJ SOLN
2.0000 mg | Freq: Once | INTRAMUSCULAR | Status: AC
Start: 2015-07-19 — End: 2015-07-19
  Administered 2015-07-19: 2 mg via INTRAVENOUS
  Filled 2015-07-19: qty 1

## 2015-07-19 MED ORDER — APIXABAN 5 MG PO TABS
5.0000 mg | ORAL_TABLET | Freq: Two times a day (BID) | ORAL | Status: DC
  Administered 2015-07-19 – 2015-07-20 (×3): 5 mg via ORAL
  Filled 2015-07-19 (×3): qty 1

## 2015-07-19 MED ORDER — SPIRONOLACTONE 25 MG PO TABS
25.0000 mg | ORAL_TABLET | Freq: Every day | ORAL | Status: DC
  Administered 2015-07-19 – 2015-07-20 (×2): 25 mg via ORAL
  Filled 2015-07-19 (×2): qty 1

## 2015-07-19 MED ORDER — INSULIN ASPART 100 UNIT/ML ~~LOC~~ SOLN
0.0000 [IU] | Freq: Three times a day (TID) | SUBCUTANEOUS | Status: DC
  Administered 2015-07-19: 8 [IU] via SUBCUTANEOUS
  Administered 2015-07-20: 2 [IU] via SUBCUTANEOUS

## 2015-07-19 MED ORDER — ISOSORB DINITRATE-HYDRALAZINE 20-37.5 MG PO TABS
2.0000 | ORAL_TABLET | Freq: Three times a day (TID) | ORAL | Status: DC
  Administered 2015-07-19 – 2015-07-20 (×3): 2 via ORAL
  Filled 2015-07-19 (×4): qty 2

## 2015-07-19 MED ORDER — SODIUM CHLORIDE 0.9 % IV BOLUS (SEPSIS)
500.0000 mL | Freq: Once | INTRAVENOUS | Status: AC
  Administered 2015-07-19: 500 mL via INTRAVENOUS

## 2015-07-19 MED ORDER — SODIUM CHLORIDE 0.9 % IV BOLUS (SEPSIS): 1000.0000 mL | Freq: Once | INTRAVENOUS | Status: DC

## 2015-07-19 MED ORDER — POTASSIUM CHLORIDE CRYS ER 20 MEQ PO TBCR
20.0000 meq | EXTENDED_RELEASE_TABLET | Freq: Two times a day (BID) | ORAL | Status: DC
  Administered 2015-07-19 – 2015-07-20 (×3): 20 meq via ORAL
  Filled 2015-07-19 (×3): qty 1

## 2015-07-19 MED ORDER — ALBUTEROL SULFATE (2.5 MG/3ML) 0.083% IN NEBU: 2.5000 mg | INHALATION_SOLUTION | Freq: Four times a day (QID) | RESPIRATORY_TRACT | Status: DC | PRN

## 2015-07-19 NOTE — ED Notes (Signed)
Informed MD of pt's bp.

## 2015-07-19 NOTE — ED Notes (Signed)
Assisted pt to restroom  

## 2015-07-19 NOTE — ED Notes (Signed)
[facility] called and said the pt left her money on the stretcher in the sheets.  Ed tech took pt to floor and left sheets in pts room. Ed tech left for the day after transporting pt to floor.

## 2015-07-19 NOTE — ED Notes (Addendum)
Pt ambulated to restroom and upon return was SOB. MD made aware.

## 2015-07-19 NOTE — ED Notes (Signed)
Patient is resting comfortably. 

## 2015-07-19 NOTE — ED Notes (Signed)
MD at bedside. 

## 2015-07-19 NOTE — H&P (Signed)
Cardiology H&P    Patient ID: BRAYLYN GOODIE MRN: [id number], DOB/AGE: [id number]   Admit date: 07/19/2015 Date of Consult: 07/19/2015  Primary Physician: [name], MD Primary Cardiologist: Dr. Babs Bertin Requesting Provider: Dr. Roderic Palau  Patient Profile    65 yo female with PMH of Nonischemic cardiomyopathy/CAD/hypertension/LBBB/hyper-lipidemia/DM II/Bi VICD and tobacco abuse who presented to the [facility] ED for complaints of progressive dyspnea on exertion and chest pain.  Past Medical History   Past Medical History  Diagnosis Date  . Hypertension   . CAD (coronary artery disease), native coronary artery     a. Nonobstructive by cath 02/2012 (done because of low EF).  . Tobacco abuse   . Chronic combined systolic and diastolic CHF (congestive heart failure) ([place])     a. 03/05/12 echo:  LVEF 20-25%, moderate LVH , inferior and basal to mid septal akinesis, anterior moderate to severe hypokinesis and grade 2 diastolic dysfunction. b. EF 07/2012: EF still 25% (unclear medication compliance).  . Anemia     a. Noted on 07/2012 labs, instructed to f/u PCP.  Marland Kitchen LBBB (left bundle branch block)   . History of noncompliance with medical treatment   . High cholesterol   . Chronic bronchitis ([place])     "~ every other year" (11/18/2012)  . Orthopnea   . Type II diabetes mellitus ([place])   . Headache(784.0)     "often; maybe not daily" (11/18/2012)  . Arthritis     "joints" (11/18/2012)  . Chronic lower back pain   . Automatic implantable cardioverter-defibrillator in situ     Past Surgical History  Procedure Laterality Date  . Joint replacement      Bilateral hip and right knee  . Cardiac catheterization  03/04/12    nonobstructive CAD, elevated LVEDP and tortuous vessels suggestive of long-standing hypertension  . Bi-ventricular implantable cardioverter defibrillator  (crt-d)  11/18/2012  . Left heart cath N/A 03/05/2012    Procedure: LEFT HEART CATH;  Surgeon: Larey Dresser, MD;  Location: [facility] CATH LAB;  Service: Cardiovascular;  Laterality: N/A;  . Bi-ventricular implantable cardioverter defibrillator N/A 11/18/2012    Procedure: BI-VENTRICULAR IMPLANTABLE CARDIOVERTER DEFIBRILLATOR  (CRT-D);  Surgeon: Evans Lance, MD;  Location: [facility] CATH LAB;  Service: Cardiovascular;  Laterality: N/A;     Allergies  Allergies  Allergen Reactions  . Morphine And Related Nausea And Vomiting    Severe nausea    History of Present Illness    Mr. Depaepe is a 65 yo female patient of [name] and Dr. Aundra Dubin with PMH of Nonischemic cardiomyopathy/ CAD/ hypertension/LBBB/ PAF/hyper-lipidemia/ DM II/ Bi VICD and tobacco abuse who was last seen in the office on 11/16/2014. At that time she was noted to be in her normal state of health, but hypertensive and encouraged to take her medications and reduce her salt intake. At this visit she was there for follow-up after being admitted in 6/16 with hypertensive urgency and CHF exacerbation. She reported at that time being out of her medications for some time and appeared volume overloaded. She was ultimately diuresed with her cardiac meds restarted an echo at that time showed an EF of 40-50%. Her last heart cath in 2/14 showed nonobstructive CAD.   Reports being in her normal state of health up until about a week ago when she began to experience some dyspnea on exertion particularly when walking from her bedroom to the bathroom. States this progressed over the past week. Reports feeling well yesterday and normal  whenever she went to bed last night, but woke up this morning short of breath and had 5-10 minutes of centralized chest pain/pressure. Denies any dizziness, lightheadedness, palpitations, nausea or vomiting. Does report some minimal lower extremity edema. This was brief in nature and resolved on its own but she proceeded to come to the ER for further evaluation related to her dyspnea on exertion.  In the ED her labs showed negative  POC troponin 2, creatinine of 0.8, BNP of 497, hemoglobin 11.8, and glucose of 365. Chest x-ray showed no acute cardiopulmonary process or edema. EKG showed known left bundle-branch block with paced rhythm. She is noted to be extremely hypertensive with last reading documented of 194/116, and is received 2 mg IV hydralazine once. In talking with the patient she reports being out of her Lasix for the past week, along with taking her last blood pressure pills yesterday. She correlates the increasing dyspnea over the past week with being out of her Lasix pills. In reviewing the chart she has been admitted previously for similar episodes. She is currently pain free, but did report some dyspnea when walking to the bathroom in the ER. Requesting food and to go home.  Cardiology has been called in relation to patient's reports of chest pain and CHF history.  Inpatient Medications    . hydrALAZINE  2 mg Intravenous Once    Family History    Family History  Problem Relation Age of Onset  . Heart disease Neg Hx     Social History    Social History   Social History  . Marital Status: Single    Spouse Name: N/A  . Number of Children: N/A  . Years of Education: N/A   Occupational History  . Not on file.   Social History Main Topics  . Smoking status: Former Smoker -- 1.00 packs/day for 40 years    Types: Cigarettes    Start date: 06/23/1972    Quit date: 03/26/2012  . Smokeless tobacco: Never Used  . Alcohol Use: No  . Drug Use: No  . Sexual Activity: Yes   Other Topics Concern  . Not on file   Social History Narrative     Review of Systems    General:  No chills, fever, night sweats or weight changes.  Cardiovascular: See HPI Dermatological: No rash, lesions/masses Respiratory: No cough, dyspnea Urologic: No hematuria, dysuria Abdominal:   No nausea, vomiting, diarrhea, bright red blood per rectum, melena, or hematemesis Neurologic:  No visual changes, wkns, changes in  mental status. All other systems reviewed and are otherwise negative except as noted above.  Physical Exam    Blood pressure 194/116, pulse 95, temperature 98.8 F (37.1 C), temperature source Oral, resp. rate 17, weight 229 lb 15 oz (104.3 kg), SpO2 96 %.  General: Pleasant older female, NAD Psych: Normal affect. Neuro: Alert and oriented X 3. Moves all extremities spontaneously. HEENT: Normal  Neck: Supple without bruits or JVD. Lungs:  Resp regular and unlabored, CTA. Heart: RRR no s3, s4, or murmurs, healed pacemaker insertion site. Abdomen: Soft, non-tender, non-distended, BS + x 4.  Extremities: No clubbing, cyanosis, trace to RLE edema. DP/PT/Radials 2+ and equal bilaterally.  Labs    Troponin (Point of Care Test)  Recent Labs  07/19/15 1022  TROPIPOC 0.02   No results for input(s): CKTOTAL, CKMB, TROPONINI in the last 72 hours. Lab Results  Component Value Date   WBC 8.3 07/19/2015   HGB 11.8* 07/19/2015   HCT  35.8* 07/19/2015   MCV 88.8 07/19/2015   PLT 399 07/19/2015    Recent Labs Lab 07/19/15 0710  NA 136  K 4.0  CL 99*  CO2 27  BUN 11  CREATININE 0.80  CALCIUM 9.5  GLUCOSE 365*   Lab Results  Component Value Date   CHOL 123 08/11/2012   HDL 40 08/11/2012   LDLCALC 66 08/11/2012   TRIG 84 08/11/2012   Lab Results  Component Value Date   DDIMER 0.60* 01/05/2014     Radiology Studies    Dg Chest 2 View  07/19/2015  CLINICAL DATA:  Shortness of breath. EXAM: CHEST  2 VIEW COMPARISON:  Radiograph of July 12, 2014. FINDINGS: Stable cardiomegaly. Left-sided pacemaker is unchanged in position. No pneumothorax or pleural effusion is noted. No acute pulmonary disease is noted. Bony thorax is unremarkable. IMPRESSION: No active cardiopulmonary disease. Electronically Signed   By: Marijo Conception, M.D.   On: 07/19/2015 07:41    ECG & Cardiac Imaging    EKG: V-paced (Known LBBB)  ECHO: 07/13/2014  Study Conclusions  - Left ventricle: Septal and  inferior wall hypokinesis. The cavity  size was mildly dilated. Wall thickness was increased in a  pattern of mild LVH. Systolic function was mildly to moderately  reduced. The estimated ejection fraction was in the range of 40%  to 45%. Doppler parameters are consistent with elevated  ventricular end-diastolic filling pressure. - Pericardium, extracardiac: small pericardial effusion no  tamponade.    Assessment & Plan    Mr. Easterbrook is a 65 yo female patient of [name] and Dr. Aundra Dubin with PMH of Nonischemic cardiomyopathy/ CAD/ hypertension/LBBB/PAF/ hyper-lipidemia/ DM II/ Bi VICD and tobacco abuse who was last seen in the office on 11/16/2014. At that time she was noted to be in her normal state of health, but hypertensive and encouraged to take her medications and reduce her salt intake. At this visit she was there for follow-up after being admitted in 6/16 with hypertensive urgency and CHF exacerbation. She reported at that time being out of her medications for some time and appeared volume overloaded. She was ultimately diuresed with her cardiac meds restarted an echo at that time showed an EF of 40-50%. Her last heart cath in 2/14 showed nonobstructive CAD.   Presented to the [facility] ED with complaints of central chest pain and increased dyspnea on exertion over the past week. In the ED her labs showed negative POC troponin 2, creatinine of 0.8, BNP of 497, hemoglobin 11.8, and glucose of 365. Chest x-ray showed no acute cardiopulmonary process or edema. EKG showed known left bundle-branch block with paced rhythm. She is noted to be extremely hypertensive with last reading documented of 194/116, and is received 2 mg IV hydralazine once.  1. Dyspnea: She is able to correlate her increasing dyspnea with the fact that she has been out of her Lasix for the past week. States her dyspnea has increased significantly over the past couple of days and she notices it more so when walking from  the bedroom to her bathroom. Her chest x-ray in the ER showed no acute pulmonary process or edema, and on physical exam her lungs are clear, with minimal edema to the right lower extremity. She has noted at previous visits to have problems with compliance with her medications in the past.  --Will hold PO lasix, start on IV lasix 40mg  BID --Check BMET in the morning --admit to telemetry  2. Chest pain: Reports having  one brief episode lasting 5-10 minutes of central chest pain that resolved independently. Is noted to be very hypertensive in the ER with systolic readings in the [id number], and received 2 mg IV of hydralazine. Does state she took her medications yesterday but has not taking any of her blood pressure medications this morning. I think her elevated blood pressure could have been a contributing factor to this episode of chest pain. --POC troponin were cycled a negative 2, EKG shows known LBBB with paced rhythm. --She is currently chest pain-free and requesting to eat and be discharged home.  3. HTN: Hypertensive in the ED with systolic pressures in the 190s, given one dose of IV hydralazine. Reports she did not take her morning medications, stating she is out. --Continue home meds include coreg, ACE, BiDil.    4. NICM: Last Echo showed EF of 40-45% in 6/16 which is improved from 25% in 07/2012. Encouraged compliance with home medications.   5. Bi VICD  6. HLD: On Statin  7. PAF: on Eliquis  Signed, Reino Bellis, NP-C Pager [phone] 07/19/2015, 12:10 PM As above, patient seen and examined; briefly she is a 65 year old female with past medical history of nonischemic CM, hypertension, noncompliance, paroxysmal atrial fibrillation, diabetes mellitus who presents with complaints of dyspnea and chest pain. Note patient has not taken her Lasix in 4 days. She has also missed some of her antihypertensives. She describes increased dyspnea on exertion, orthopnea and pedal edema. She had chest  tightness for 5 min that she states was mild. Blood pressure in the emergency room 194/116. Electrocardiogram shows sinus rhythm. Troponin normal. 1 noncompliance-patient counseled on importance of complying with medications. 2 hypertensive urgency-resume medications and follow blood pressure and adjust as needed. 3 Acute on chronic systolic congestive heart failure--due to noncompliance and hypertension; patient is taking 40 of Lasix BID; treat with IV Lasix 40 BID overnight. Check renal function tomorrow morning.  4 nonischemic cardiomyopathy-likely secondary to uncontrolled hypertension. Resume ACE inhibitor, BiDil and carvedilol. 5 Chest pain-atypical. If enzymes negative no further ischemia evaluation. 6 PAF-in sinus; resume coreg and apixaban.  Kirk Ruths

## 2015-07-19 NOTE — ED Provider Notes (Signed)
CSN: [id number]     Arrival date & time 07/19/15  [id number] History   First MD Initiated Contact with Patient 07/19/15 [phone]     Chief Complaint  Patient presents with  . Shortness of Breath  . Hypoglycemia  . Near Syncope     (Consider location/radiation/quality/duration/timing/severity/associated sxs/prior Treatment) Patient is a 65 y.o. female presenting with shortness of breath, hypoglycemia, and near-syncope. The history is provided by the patient (Patient complains of some chest pain shortness of breath today. She has a history of congestive heart failure).  Shortness of Breath Severity:  Moderate Onset quality:  Sudden Timing:  Constant Progression:  Unable to specify Chronicity:  Recurrent Context: activity   Associated symptoms: no abdominal pain, no chest pain, no cough, no headaches and no rash   Hypoglycemia Associated symptoms: shortness of breath   Associated symptoms: no seizures   Near Syncope Associated symptoms include shortness of breath. Pertinent negatives include no chest pain, no abdominal pain and no headaches.    Past Medical History  Diagnosis Date  . Hypertension   . CAD (coronary artery disease), native coronary artery     a. Nonobstructive by cath 02/2012 (done because of low EF).  . Tobacco abuse   . Chronic combined systolic and diastolic CHF (congestive heart failure) ([place])     a. 03/05/12 echo:  LVEF 20-25%, moderate LVH , inferior and basal to mid septal akinesis, anterior moderate to severe hypokinesis and grade 2 diastolic dysfunction. b. EF 07/2012: EF still 25% (unclear medication compliance).  . Anemia     a. Noted on 07/2012 labs, instructed to f/u PCP.  Marland Kitchen LBBB (left bundle branch block)   . History of noncompliance with medical treatment   . High cholesterol   . Chronic bronchitis ([place])     "~ every other year" (11/18/2012)  . Orthopnea   . Type II diabetes mellitus ([place])   . Headache(784.0)     "often; maybe not daily" (11/18/2012)  .  Arthritis     "joints" (11/18/2012)  . Chronic lower back pain   . Automatic implantable cardioverter-defibrillator in situ    Past Surgical History  Procedure Laterality Date  . Joint replacement      Bilateral hip and right knee  . Cardiac catheterization  03/04/12    nonobstructive CAD, elevated LVEDP and tortuous vessels suggestive of long-standing hypertension  . Bi-ventricular implantable cardioverter defibrillator  (crt-d)  11/18/2012  . Left heart cath N/A 03/05/2012    Procedure: LEFT HEART CATH;  Surgeon: Larey Dresser, MD;  Location: [facility] CATH LAB;  Service: Cardiovascular;  Laterality: N/A;  . Bi-ventricular implantable cardioverter defibrillator N/A 11/18/2012    Procedure: BI-VENTRICULAR IMPLANTABLE CARDIOVERTER DEFIBRILLATOR  (CRT-D);  Surgeon: Evans Lance, MD;  Location: [facility] CATH LAB;  Service: Cardiovascular;  Laterality: N/A;   Family History  Problem Relation Age of Onset  . Heart disease Neg Hx    Social History  Substance Use Topics  . Smoking status: Former Smoker -- 1.00 packs/day for 40 years    Types: Cigarettes    Start date: 06/23/1972    Quit date: 03/26/2012  . Smokeless tobacco: Never Used  . Alcohol Use: No   OB History    No data available     Review of Systems  Constitutional: Negative for appetite change and fatigue.  HENT: Negative for congestion, ear discharge and sinus pressure.   Eyes: Negative for discharge.  Respiratory: Positive for shortness of breath. Negative for cough.  Cardiovascular: Positive for near-syncope. Negative for chest pain.  Gastrointestinal: Negative for abdominal pain and diarrhea.  Genitourinary: Negative for frequency and hematuria.  Musculoskeletal: Negative for back pain.  Skin: Negative for rash.  Neurological: Negative for seizures and headaches.  Psychiatric/Behavioral: Negative for hallucinations.      Allergies  Morphine and related  Home Medications   Prior to Admission medications    Medication Sig Start Date End Date Taking? Authorizing Provider  albuterol (PROVENTIL HFA;VENTOLIN HFA) 108 (90 BASE) MCG/ACT inhaler Inhale 2 puffs into the lungs every 6 (six) hours as needed for wheezing or shortness of breath.   Yes Historical Provider, MD  atorvastatin (LIPITOR) 40 MG tablet Take 1 tablet (40 mg total) by mouth daily. 07/25/14  Yes Burtis Junes, NP  carvedilol (COREG) 12.5 MG tablet Take 12.5 mg by mouth 2 (two) times daily with a meal.   Yes Historical Provider, MD  enalapril (VASOTEC) 10 MG tablet Take 1 tablet (10 mg total) by mouth daily. 11/20/14  Yes Larey Dresser, MD  furosemide (LASIX) 40 MG tablet Take 1 tablet (40 mg total) by mouth 2 (two) times daily. 02/05/14  Yes Debbe Odea, MD  Insulin Glargine (LANTUS SOLOSTAR) 100 UNIT/ML Solostar Pen Inject 50 Units into the skin at bedtime.   Yes Historical Provider, MD  isosorbide-hydrALAZINE (BIDIL) 20-37.5 MG tablet Take 2 tablets by mouth 3 (three) times daily. 11/20/14  Yes Larey Dresser, MD  metFORMIN (GLUCOPHAGE) 1000 MG tablet Take 1,000 mg by mouth 2 (two) times daily with a meal.   Yes Historical Provider, MD  metFORMIN (GLUCOPHAGE) 850 MG tablet Take 1 tablet by mouth 2 (two) times daily. 06/27/15  Yes Historical Provider, MD  potassium chloride SA (K-DUR,KLOR-CON) 20 MEQ tablet Take 1 tablet (20 mEq total) by mouth 2 (two) times daily. 07/15/14  Yes Brittainy Erie Noe, PA-C  spironolactone (ALDACTONE) 25 MG tablet Take 1 tablet (25 mg total) by mouth daily. 07/15/14  Yes Brittainy Erie Noe, PA-C  apixaban (ELIQUIS) 5 MG TABS tablet Take 1 tablet (5 mg total) by mouth 2 (two) times daily. 07/15/14   Brittainy Erie Noe, PA-C  rizatriptan (MAXALT) 5 MG tablet Take 5 mg by mouth 3 (three) times daily. 06/17/14   Historical Provider, MD   BP 189/110 mmHg  Pulse 101  Temp(Src) 98.7 F (37.1 C) (Oral)  Resp 27  Wt 229 lb 15 oz (104.3 kg)  SpO2 98% Physical Exam  Constitutional: She is oriented to person,  place, and time. She appears well-developed.  HENT:  Head: Normocephalic.  Eyes: Conjunctivae and EOM are normal. No scleral icterus.  Neck: Neck supple. No thyromegaly present.  Cardiovascular: Normal rate and regular rhythm.  Exam reveals no gallop and no friction rub.   No murmur heard. Pulmonary/Chest: No stridor. She has no wheezes. She has no rales. She exhibits no tenderness.  Abdominal: She exhibits no distension. There is no tenderness. There is no rebound.  Musculoskeletal: Normal range of motion. She exhibits no edema.  Lymphadenopathy:    She has no cervical adenopathy.  Neurological: She is oriented to person, place, and time. She exhibits normal muscle tone. Coordination normal.  Skin: No rash noted. No erythema.  Psychiatric: She has a normal mood and affect. Her behavior is normal.    ED Course  Procedures (including critical care time) Labs Review Labs Reviewed  BASIC METABOLIC PANEL - Abnormal; Notable for the following:    Chloride 99 (*)    Glucose, Bld 365 (*)  All other components within normal limits  CBC - Abnormal; Notable for the following:    Hemoglobin 11.8 (*)    HCT 35.8 (*)    All other components within normal limits  PROTIME-INR - Abnormal; Notable for the following:    Prothrombin Time 17.7 (*)    All other components within normal limits  URINALYSIS, ROUTINE W REFLEX MICROSCOPIC (NOT AT [facility]) - Abnormal; Notable for the following:    Glucose, UA >1000 (*)    Protein, ur 30 (*)    All other components within normal limits  BRAIN NATRIURETIC PEPTIDE - Abnormal; Notable for the following:    B Natriuretic Peptide 497.8 (*)    All other components within normal limits  URINE MICROSCOPIC-ADD ON - Abnormal; Notable for the following:    Squamous Epithelial / LPF 0-5 (*)    Bacteria, UA RARE (*)    Casts HYALINE CASTS (*)    All other components within normal limits  CBG MONITORING, ED - Abnormal; Notable for the following:     Glucose-Capillary 327 (*)    All other components within normal limits  CBG MONITORING, ED - Abnormal; Notable for the following:    Glucose-Capillary 215 (*)    All other components within normal limits  TROPONIN I  TROPONIN I  I-STAT TROPOININ, ED  I-STAT TROPOININ, ED    Imaging Review Dg Chest 2 View  07/19/2015  CLINICAL DATA:  Shortness of breath. EXAM: CHEST  2 VIEW COMPARISON:  Radiograph of July 12, 2014. FINDINGS: Stable cardiomegaly. Left-sided pacemaker is unchanged in position. No pneumothorax or pleural effusion is noted. No acute pulmonary disease is noted. Bony thorax is unremarkable. IMPRESSION: No active cardiopulmonary disease. Electronically Signed   By: Marijo Conception, M.D.   On: 07/19/2015 07:41   I have personally reviewed and evaluated these images and lab results as part of my medical decision-making.   EKG Interpretation   Date/Time:  Thursday July 19 2015 07:03:25 EDT Ventricular Rate:  106 PR Interval:  142 QRS Duration: 138 QT Interval:  404 QTC Calculation: 536 R Axis:   -109 Text Interpretation:  Atrial-sensed ventricular-paced rhythm Abnormal ECG  Confirmed by Greysen Devino  MD, Devi Hopman [phone]) on 07/19/2015 8:15:45 AM      MDM   Final diagnoses:  Chest pain at rest    Patient is admitted to telemetry by cardiology for poorly controlled blood pressure congestive heart failure    Milton Ferguson, MD 07/19/15 1353

## 2015-07-19 NOTE — ED Notes (Signed)
Pt in reports SOB, CP, hypogylcemia (97, pt states this is low for her), pt was at work at 0600 and had near syncopal episode. Pt A/OX4.

## 2015-07-20 ENCOUNTER — Other Ambulatory Visit: Payer: Self-pay | Admitting: Cardiology

## 2015-07-20 DIAGNOSIS — I5043 Acute on chronic combined systolic (congestive) and diastolic (congestive) heart failure: Secondary | ICD-10-CM | POA: Diagnosis not present

## 2015-07-20 DIAGNOSIS — I5032 Chronic diastolic (congestive) heart failure: Secondary | ICD-10-CM

## 2015-07-20 DIAGNOSIS — I110 Hypertensive heart disease with heart failure: Secondary | ICD-10-CM | POA: Diagnosis not present

## 2015-07-20 LAB — HEMOGLOBIN A1C
Hgb A1c MFr Bld: 11.5 % — ABNORMAL HIGH (ref 4.8–5.6)
Mean Plasma Glucose: 283 mg/dL

## 2015-07-20 LAB — BASIC METABOLIC PANEL
Anion gap: 5 (ref 5–15)
BUN: 10 mg/dL (ref 6–20)
CO2: 30 mmol/L (ref 22–32)
Calcium: 9 mg/dL (ref 8.9–10.3)
Chloride: 103 mmol/L (ref 101–111)
Creatinine, Ser: 0.78 mg/dL (ref 0.44–1.00)
GFR calc Af Amer: >60 mL/min (ref >60)
GFR calc non Af Amer: >60 mL/min (ref >60)
Glucose, Bld: 138 mg/dL — ABNORMAL HIGH (ref 65–99)
Potassium: 3.6 mmol/L (ref 3.5–5.1)
Sodium: 138 mmol/L (ref 135–145)

## 2015-07-20 LAB — TROPONIN I: Troponin I: <0.03 ng/mL (ref <0.031)

## 2015-07-20 LAB — GLUCOSE, CAPILLARY: Glucose-Capillary: 146 mg/dL — ABNORMAL HIGH (ref 65–99)

## 2015-07-20 MED ORDER — APIXABAN 5 MG PO TABS: 5.0000 mg | ORAL_TABLET | Freq: Two times a day (BID) | ORAL | Status: DC

## 2015-07-20 MED ORDER — ISOSORB DINITRATE-HYDRALAZINE 20-37.5 MG PO TABS: 2.0000 | ORAL_TABLET | Freq: Three times a day (TID) | ORAL | Status: DC

## 2015-07-20 MED ORDER — CARVEDILOL 12.5 MG PO TABS: 12.5000 mg | ORAL_TABLET | Freq: Two times a day (BID) | ORAL | Status: DC

## 2015-07-20 MED ORDER — ENALAPRIL MALEATE 10 MG PO TABS: 10.0000 mg | ORAL_TABLET | Freq: Every day | ORAL | Status: DC

## 2015-07-20 MED ORDER — ATORVASTATIN CALCIUM 40 MG PO TABS: 40.0000 mg | ORAL_TABLET | Freq: Every day | ORAL | Status: DC

## 2015-07-20 MED ORDER — FUROSEMIDE 40 MG PO TABS: 40.0000 mg | ORAL_TABLET | Freq: Two times a day (BID) | ORAL | Status: DC

## 2015-07-20 MED ORDER — SPIRONOLACTONE 25 MG PO TABS: 25.0000 mg | ORAL_TABLET | Freq: Every day | ORAL | Status: DC

## 2015-07-20 MED ORDER — POTASSIUM CHLORIDE CRYS ER 20 MEQ PO TBCR: 20.0000 meq | EXTENDED_RELEASE_TABLET | Freq: Two times a day (BID) | ORAL | Status: DC

## 2015-07-20 NOTE — Discharge Summary (Signed)
Discharge Summary    Patient ID: [name],  MRN: [id number], DOB/AGE: 1950-02-08 65 y.o.  Admit date: 07/19/2015 Discharge date: 07/20/2015  Primary Care Provider: [name] Primary Cardiologist: Dr. Babs Bertin  Discharge Diagnoses    Active Problems:   Acute on chronic combined systolic and diastolic heart failure [facility])   Essential hypertension   Dyspnea   Allergies Allergies  Allergen Reactions  . Morphine And Related Nausea And Vomiting    Severe nausea    Diagnostic Studies/Procedures    None _____________   History of Present Illness     [name] is a 65 yo female patient of [name] and Dr. Aundra Dubin with PMH of Nonischemic cardiomyopathy/ CAD/ hypertension/LBBB/ PAF/hyper-lipidemia/ DM II/ Bi VICD and tobacco abuse who was last seen in the office on 11/16/2014. At that time she was noted to be in her normal state of health, but hypertensive and encouraged to take her medications and reduce her salt intake. At this visit she was there for follow-up after being admitted in 6/16 with hypertensive urgency and CHF exacerbation. She reported at that time being out of her medications for some time and appeared volume overloaded. She was ultimately diuresed with her cardiac meds restarted an echo at that time showed an EF of 40-50%. Her last heart cath in 2/14 showed nonobstructive CAD.   Reports being in her normal state of health up until about a week ago when she began to experience some dyspnea on exertion particularly when walking from her bedroom to the bathroom. States this progressed over the past week. Reports feeling well yesterday and normal whenever she went to bed last night, but woke up this morning short of breath and had 5-10 minutes of centralized chest pain/pressure. Denies any dizziness, lightheadedness, palpitations, nausea or vomiting. Does report some minimal lower extremity edema. This chest pain was brief in nature and resolved on its  own but she proceeded to come to the ER for further evaluation related to her dyspnea on exertion.  In the ED her labs showed negative POC troponin 2, creatinine of 0.8, BNP of 497, hemoglobin 11.8, and glucose of 365. Chest x-ray showed no acute cardiopulmonary process or edema. EKG showed known left bundle-branch block with paced rhythm. She is noted to be extremely hypertensive with last reading documented of 194/116, and is received 2 mg IV hydralazine once. In talking with the patient she reports being out of her Lasix for the past week, along with taking her last blood pressure pills yesterday. She correlates the increasing dyspnea over the past week with being out of her Lasix pills. In reviewing the chart she has been admitted previously for similar episodes.  Hospital Course     [name] was admitted overnight for diuresis and started back on her home medications. Her troponins were cycled and negative x3. She was placed on 40mg  IV Lasix BID with 1.6L UOP overnight and loss of 3lbs. Her blood pressure improved greatly being placed back on her home medications.   On 07/20/2015 she denied having any recurrent chest pain during this admission. Morning labs showed stable Cr with diuresis. The importance of medication compliance was stressed to her again during this admission. She was seen and examined by Dr. Stanford Breed and determined stable for discharge. Will arrange for outpatient Echo for follow up LV function and follow up with Dr. Aundra Dubin.  _____________  Discharge Vitals Blood pressure 141/73, pulse 79, temperature 97.8 F (36.6 C), temperature source  Oral, resp. rate 18, height 5\' 7"  (1.702 m), weight 226 lb 1.6 oz (102.558 kg), SpO2 97 %.  Filed Weights   07/19/15 0814 07/19/15 1519 07/20/15 0540  Weight: 229 lb 15 oz (104.3 kg) 228 lb 14.4 oz (103.828 kg) 226 lb 1.6 oz (102.558 kg)    Labs & Radiologic Studies    CBC  Recent Labs  07/19/15 0710 07/19/15 1527  WBC 8.3 9.2  HGB  11.8* 11.6*  HCT 35.8* 34.3*  MCV 88.8 89.6  PLT 399 0000000   Basic Metabolic Panel  Recent Labs  07/19/15 0710 07/19/15 1527 07/20/15 0424  NA 136  --  138  K 4.0  --  3.6  CL 99*  --  103  CO2 27  --  30  GLUCOSE 365*  --  138*  BUN 11  --  10  CREATININE 0.80 0.72 0.78  CALCIUM 9.5  --  9.0   Cardiac Enzymes  Recent Labs  07/19/15 1527 07/19/15 2204 07/20/15 0424  TROPONINI <0.03 <0.03 <0.03   Hemoglobin A1C  Recent Labs  07/19/15 1527  HGBA1C 11.5*   _____________  Dg Chest 2 View  07/19/2015  CLINICAL DATA:  Shortness of breath. EXAM: CHEST  2 VIEW COMPARISON:  Radiograph of July 12, 2014. FINDINGS: Stable cardiomegaly. Left-sided pacemaker is unchanged in position. No pneumothorax or pleural effusion is noted. No acute pulmonary disease is noted. Bony thorax is unremarkable. IMPRESSION: No active cardiopulmonary disease. Electronically Signed   By: Marijo Conception, M.D.   On: 07/19/2015 07:41   Disposition   Pt is being discharged home today in good condition.  Follow-up Plans & Appointments    Follow-up Information    Follow up with [name], MD On 08/15/2015.   Specialty:  Cardiology   Why:  11:20am for your hospital follow up   Contact information:   [place] [address] [phone]      Discharge Instructions    Diet - low sodium heart healthy    Complete by:  As directed      Discharge instructions    Complete by:  As directed   For patients with congestive heart failure, we give them these special instructions:  1. Follow a low-salt diet and watch your fluid intake. In general, you should not be taking in more than 2 liters of fluid per day (no more than 8 glasses per day). Some patients are restricted to less than 1.5 liters of fluid per day (no more than 6 glasses per day). This includes sources of water in foods like soup, coffee, tea, milk, etc. 2. Weigh yourself on the same scale at same time of day and  keep a log. 3. Call your doctor: (Anytime you feel any of the following symptoms)  - 3-4 pound weight gain in 1-2 days or 2 pounds overnight  - Shortness of breath, with or without a dry hacking cough  - Swelling in the hands, feet or stomach  - If you have to sleep on extra pillows at night in order to breathe   IT IS IMPORTANT TO LET YOUR DOCTOR KNOW EARLY ON IF YOU ARE HAVING SYMPTOMS SO WE CAN HELP YOU!'     Increase activity slowly    Complete by:  As directed            Discharge Medications   Current Discharge Medication List    CONTINUE these medications which have CHANGED   Details  apixaban (ELIQUIS) 5 MG TABS tablet Take 1 tablet (5 mg total) by mouth 2 (two) times daily. Qty: 60 tablet, Refills: 11    atorvastatin (LIPITOR) 40 MG tablet Take 1 tablet (40 mg total) by mouth daily. Qty: 30 tablet, Refills: 11    carvedilol (COREG) 12.5 MG tablet Take 1 tablet (12.5 mg total) by mouth 2 (two) times daily with a meal. Qty: 60 tablet, Refills: 11    enalapril (VASOTEC) 10 MG tablet Take 1 tablet (10 mg total) by mouth daily. Qty: 30 tablet, Refills: 11    furosemide (LASIX) 40 MG tablet Take 1 tablet (40 mg total) by mouth 2 (two) times daily. Qty: 60 tablet, Refills: 11    isosorbide-hydrALAZINE (BIDIL) 20-37.5 MG tablet Take 2 tablets by mouth 3 (three) times daily. Qty: 90 tablet, Refills: 11    potassium chloride SA (K-DUR,KLOR-CON) 20 MEQ tablet Take 1 tablet (20 mEq total) by mouth 2 (two) times daily. Qty: 60 tablet, Refills: 11    spironolactone (ALDACTONE) 25 MG tablet Take 1 tablet (25 mg total) by mouth daily. Qty: 30 tablet, Refills: 11      CONTINUE these medications which have NOT CHANGED   Details  albuterol (PROVENTIL HFA;VENTOLIN HFA) 108 (90 BASE) MCG/ACT inhaler Inhale 2 puffs into the lungs every 6 (six) hours as needed for wheezing or shortness of breath.    Insulin Glargine (LANTUS SOLOSTAR) 100 UNIT/ML Solostar Pen Inject 50 Units into  the skin at bedtime.    metFORMIN (GLUCOPHAGE) 1000 MG tablet Take 1,000 mg by mouth 2 (two) times daily with a meal.    rizatriptan (MAXALT) 5 MG tablet Take 5 mg by mouth 3 (three) times daily. Refills: 1           Outstanding Labs/Studies   Scheduled for outpatient Echo.  Duration of Discharge Encounter   Greater than 30 minutes including physician time.  Signed, Reino Bellis NP-C 07/20/2015, 10:12 AM

## 2015-07-20 NOTE — Progress Notes (Signed)
Patient Name: [name] Date of Encounter: 07/20/2015  Hospital Problem List     Active Problems:   Acute on chronic combined systolic and diastolic heart failure (HCC)   Dyspnea    Subjective   Reports she is feeling well this morning. No more dyspnea or chest pain.  Inpatient Medications    . apixaban  5 mg Oral BID  . aspirin EC  81 mg Oral Daily  . atorvastatin  40 mg Oral Daily  . carvedilol  12.5 mg Oral BID WC  . enalapril  10 mg Oral Daily  . furosemide  40 mg Intravenous BID  . insulin aspart  0-15 Units Subcutaneous TID WC  . insulin glargine  50 Units Subcutaneous QHS  . isosorbide-hydrALAZINE  2 tablet Oral TID  . potassium chloride SA  20 mEq Oral BID  . sodium chloride flush  3 mL Intravenous Q12H  . spironolactone  25 mg Oral Daily    Vital Signs    Filed Vitals:   07/19/15 1519 07/19/15 2000 07/20/15 0540 07/20/15 0723  BP: 105/75 144/74 127/58 141/73  Pulse: 100 97 78 79  Temp: 98.6 F (37 C) 98.8 F (37.1 C) 98.2 F (36.8 C) 97.8 F (36.6 C)  TempSrc: Oral Oral Oral Oral  Resp: 20 20 18 18   Height: 5\' 7"  (1.702 m)     Weight: 228 lb 14.4 oz (103.828 kg)  226 lb 1.6 oz (102.558 kg)   SpO2: 100% 100% 100% 97%    Intake/Output Summary (Last 24 hours) at 07/20/15 0904 Last data filed at 07/20/15 0828  Gross per 24 hour  Intake   1322 ml  Output   1875 ml  Net   -553 ml   Filed Weights   07/19/15 0814 07/19/15 1519 07/20/15 0540  Weight: 229 lb 15 oz (104.3 kg) 228 lb 14.4 oz (103.828 kg) 226 lb 1.6 oz (102.558 kg)    Physical Exam    General: Pleasant older female, NAD. Neuro: Alert and oriented X 3. Moves all extremities spontaneously. Psych: Normal affect. HEENT:  Normal  Neck: Supple without bruits or JVD. Lungs:  Resp regular and unlabored, CTA. Heart: RRR no s3, s4, or murmurs, healed pacemaker insertion site. Abdomen: Soft, non-tender, non-distended, BS + x 4.  Extremities: No clubbing, cyanosis or edema. DP/PT/Radials  2+ and equal bilaterally.  Labs    CBC  Recent Labs  07/19/15 0710 07/19/15 1527  WBC 8.3 9.2  HGB 11.8* 11.6*  HCT 35.8* 34.3*  MCV 88.8 89.6  PLT 399 0000000   Basic Metabolic Panel  Recent Labs  07/19/15 0710 07/19/15 1527 07/20/15 0424  NA 136  --  138  K 4.0  --  3.6  CL 99*  --  103  CO2 27  --  30  GLUCOSE 365*  --  138*  BUN 11  --  10  CREATININE 0.80 0.72 0.78  CALCIUM 9.5  --  9.0   Cardiac Enzymes  Recent Labs  07/19/15 1527 07/19/15 2204 07/20/15 0424  TROPONINI <0.03 <0.03 <0.03   Hemoglobin A1C  Recent Labs  07/19/15 1527  HGBA1C 11.5*    Telemetry    V-paced Rate-90s  ECG    No morning EKG  Radiology     Assessment & Plan    1. Dyspnea: She is able to correlate her increasing dyspnea with the fact that she has been out of her Lasix for the past week. States her dyspnea has increased significantly over  the past couple of days and she notices it more so when walking from the bedroom to her bathroom. Her chest x-ray in the ER showed no acute pulmonary process or edema, and on physical exam her lungs are clear, with minimal edema to the right lower extremity. She has noted at previous visits to have problems with compliance with her medications in the past.  --Started on IV lasix 40mg  BID yesterday, reports her breathing is much better today.  --Weight down 229>>226, Cr stable 0.78, UOP 1.6L  2. Chest pain: Reports having one brief episode lasting 5-10 minutes of central chest pain that resolved independently. Was noted to be very hypertensive in the ER with systolic readings in the [id number], and received 2 mg IV of hydralazine. Does state she took her medications the day prior to ER visit but has not taked any of her blood pressure medications the morning she came the ED. I think her elevated blood pressure could have been a contributing factor to this episode of chest pain. --POC troponin were cycled a negative 3, EKG shows known LBBB with  paced rhythm. --She has remained without chest pain during this admission  3. HTN: Hypertensive in the ED with systolic pressures in the 190s, given one dose of IV hydralazine. Reports she did not take her morning medications, stating she is out. --Started back on home meds include coreg, ACE, BiDil. Blood pressure has much improved back on her medications.  4. NICM: Last Echo showed EF of 40-45% in 6/16 which is improved from 25% in 07/2012. Encouraged compliance with home medications.   5. Bi VICD  6. HLD: On Statin  7. PAF: on Eliquis  Signed, Reino Bellis NP-C Pager [phone] As above, patient seen and examined.Her dyspnea has resolved. No chest pain. Blood pressure improved. Enzymes negative.Plan to discharge today on preadmission medications (DC ASA as pt on apixaban). I have stressed again compliance. Would arrange outpatient echocardiogram to reassess LV function and follow-up with Dr. Aundra Dubin. [place]

## 2015-07-20 NOTE — Care Management Note (Signed)
Case Management Note  Patient Details  Name: [name] MRN: [id number] Date of Birth: 1950/02/16  Subjective/Objective:        Admitted with CHF            Action/Plan: Patient is independent of ADL's, has private insurance with [facility] with prescription drug coverage; pharmacy of choice is [facility]- pt reports no problem getting her medication although there was a lapse in taking her medication- patient stated that he forgot and it was her fault - now she states that she will do better with taking her medication as directed. PCP is Dr Noah Delaine and she see's him once a month, She tries to eat a heart healthy diet but continues to use salt. CM talked to patient about monitoring her salt intake; she has scales and weighs herself daily.  Expected Discharge Date:   07/20/2015               Expected Discharge Plan:  Home/Self Care  Choice offered to:  Patient  Status of Service:  Completed, signed off  Sherrilyn Rist [id number] 07/20/2015, 9:50 AM

## 2015-07-20 NOTE — Discharge Instructions (Signed)

## 2015-07-20 NOTE — Care Management Obs Status (Signed)
[facility] NOTIFICATION   Patient Details  Name: ZEENA CEFALO MRN: [id number] Date of Birth: [birth year]   Medicare Observation Status Notification Given:  Yes    Royston Bake, RN 07/20/2015, 9:49 AM

## 2015-07-20 NOTE — Progress Notes (Signed)
Orders received for pt discharge.  Discharge summary printed and reviewed with pt.  Explained medication regimen, and pt had no further questions at this time.  IV removed and site remains clean, dry, intact.  Telemetry removed.  Pt in stable condition and awaiting transport. 

## 2015-07-23 ENCOUNTER — Telehealth: Payer: Self-pay | Admitting: Cardiology

## 2015-07-23 NOTE — Telephone Encounter (Signed)
D/C  Phone call . Appt on 08/15/15 at 11:20am at the [facility] .Marland Kitchen  Thanks

## 2015-07-23 NOTE — Telephone Encounter (Signed)
Patient contacted regarding discharge from [facility] on 07/20/2015.  Patient understands to follow up with provider Dr Aundra Dubin on 08/15/2015 at 11:20 am at [facility]. Patient understands discharge instructions? Yes Patient understands medications and regiment? Yes Patient understands to bring all medications to this visit? Yes

## 2015-08-13 ENCOUNTER — Other Ambulatory Visit (INDEPENDENT_AMBULATORY_CARE_PROVIDER_SITE_OTHER): Payer: Medicare Other

## 2015-08-13 DIAGNOSIS — R0989 Other specified symptoms and signs involving the circulatory and respiratory systems: Secondary | ICD-10-CM

## 2015-08-15 ENCOUNTER — Encounter (HOSPITAL_COMMUNITY): Payer: Self-pay

## 2015-08-15 ENCOUNTER — Ambulatory Visit (HOSPITAL_COMMUNITY)
Admit: 2015-08-15 | Discharge: 2015-08-15 | Disposition: A | Discharge status: Home / Self Care | Payer: Medicare Other | Attending: Cardiology | Admitting: Cardiology

## 2015-08-15 VITALS — BP 110/62 | HR 84 | Wt 221.2 lb

## 2015-08-15 DIAGNOSIS — I2510 Atherosclerotic heart disease of native coronary artery without angina pectoris: Secondary | ICD-10-CM | POA: Insufficient documentation

## 2015-08-15 DIAGNOSIS — Z7901 Long term (current) use of anticoagulants: Secondary | ICD-10-CM | POA: Insufficient documentation

## 2015-08-15 DIAGNOSIS — E785 Hyperlipidemia, unspecified: Secondary | ICD-10-CM | POA: Diagnosis not present

## 2015-08-15 DIAGNOSIS — I420 Dilated cardiomyopathy: Secondary | ICD-10-CM | POA: Diagnosis not present

## 2015-08-15 DIAGNOSIS — I110 Hypertensive heart disease with heart failure: Secondary | ICD-10-CM | POA: Diagnosis present

## 2015-08-15 DIAGNOSIS — I429 Cardiomyopathy, unspecified: Secondary | ICD-10-CM | POA: Diagnosis not present

## 2015-08-15 DIAGNOSIS — J849 Interstitial pulmonary disease, unspecified: Secondary | ICD-10-CM | POA: Diagnosis not present

## 2015-08-15 DIAGNOSIS — E119 Type 2 diabetes mellitus without complications: Secondary | ICD-10-CM | POA: Diagnosis not present

## 2015-08-15 DIAGNOSIS — I480 Paroxysmal atrial fibrillation: Secondary | ICD-10-CM | POA: Diagnosis not present

## 2015-08-15 DIAGNOSIS — I5022 Chronic systolic (congestive) heart failure: Secondary | ICD-10-CM | POA: Diagnosis not present

## 2015-08-15 DIAGNOSIS — J449 Chronic obstructive pulmonary disease, unspecified: Secondary | ICD-10-CM | POA: Insufficient documentation

## 2015-08-15 DIAGNOSIS — Z794 Long term (current) use of insulin: Secondary | ICD-10-CM | POA: Diagnosis not present

## 2015-08-15 LAB — BASIC METABOLIC PANEL
Anion gap: 10 (ref 5–15)
BUN: 18 mg/dL (ref 6–20)
CO2: 26 mmol/L (ref 22–32)
Calcium: 9.3 mg/dL (ref 8.9–10.3)
Chloride: 97 mmol/L — ABNORMAL LOW (ref 101–111)
Creatinine, Ser: 0.97 mg/dL (ref 0.44–1.00)
GFR calc Af Amer: >60 mL/min (ref >60)
GFR calc non Af Amer: >60 mL/min (ref >60)
Glucose, Bld: 385 mg/dL — ABNORMAL HIGH (ref 65–99)
Potassium: 4.2 mmol/L (ref 3.5–5.1)
Sodium: 133 mmol/L — ABNORMAL LOW (ref 135–145)

## 2015-08-15 LAB — BRAIN NATRIURETIC PEPTIDE: B Natriuretic Peptide: 39 pg/mL (ref 0.0–100.0)

## 2015-08-15 NOTE — Progress Notes (Signed)
Patient ID: [name], female   DOB: 09-Mar-1950, 65 y.o.   MRN: [id number] PCP: Dr. Alyson Ingles Cardiology: Dr Aundra Dubin  78 yo with history of nonischemic cardiomyopathy presents for cardiology followup.  She was admitted with CHF exacerbation in 02/2012.  EF 20-25% on echo, LHC with nonobstructive CAD.  She was started on cardiac meds and discharged.  In 07/2012, she was admitted again with CHF exacerbation.  She had run out of Lasix.  She was taking her other heart medications as ordered, however.  She was diuresed and discharged. She had a chronic LBBB, and Medtronic CRT-D device was placed in 10/14.  She was admitted in 6/16 with hypertensive emergency and CHF exacerbation.  She had been out of her medications for some time.  She was very volume overloaded and was diuresed.  Cardiac meds restarted.  Echo showed EF 40-45%.    Also of note, she had PFTs in 9/16 showing restrictive spirometry with low lung volume and DLCO.  She was supposed to get a high resolution CT to evaluate for interstitial lung disease but never had the study.    She was admitted in 6/17 with chest pain.  She had been out of Lasix for a week and was actually thought to be volume overloaded.  She was ruled out for MI.  She was diuresed with IV Lasix.  She is now back on her meds.   Currently doing well.  No orthopnea/PND. She is able to walk about 1 block before getting short of breath. No chest pain.  She is now working at a hotel.   Optivol was assessed today: Fluid index is below threshold with stable impedance.  Prior to early July, fluid index was high and impedance was low.   Labs (2/14): SPEP negative, UPEP negative, HIV negative Labs (7/14): K 4, creatinine 1.0, LDL 66 Labs (08/25/12): K 4, creatinine 1.0 Labs (10/06/12) : K 3.3 creatinine 0.7 pro-bnp 188 Labs (11/15/12): K 4.4, creatinine 1.04, pro-BNP 468 Labs (3/15): K 4.3 Creatinine 0.83 Labs (5/15): K 4.1 Creatinine 0.85 Labs (9/16): K 4.3, creatinine 1.01 => 0.84,  HCT 38.1, BNP 234 Labs (6/17): K 3.6, creatinine 0.78  PMH: 1. HTN 2. Type II diabetes 3. Nonischemic cardiomyopathy: ? Due to HTN versus LBBB CMP.  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  Echo (2/14) with EF 20-25%.  Echo (7/14) with EF 25%, diffuse hypokinesis.  HIV, SPEP, UPEP negative.  Has LBBB. CRT-D 10/2012 (Medtronic).  Echo (4/15) with EF 45-50%, mild diffuse hypokinesis, PA systolic pressure 38 mmHg.  Echo (6/16) with EF 40-45%, mild LVH, septal and inferior hypokinesis.   4. Chronic LBBB 5. Right TKR 6. Bilateral THR.  7. Hyperlipidemia 8. ?COPD: Has oxygen for use with exertion.  9. Atrial fibrillation: Paroxysmal.  10. PFTs (9/16) with FEV1 77%, FVC 76%, ratio 101%, TLC 63%, DLCO 41% => moderate restrictive deficit.   SH: Prior smoker, quit 2/14.  Never drank ETOH.  No drugs. Lives with son.   FH: Mother with "heart trouble."   ROS: All systems reviewed and negative except as per HPI.   Current Outpatient Prescriptions  Medication Sig Dispense Refill  . albuterol (PROVENTIL HFA;VENTOLIN HFA) 108 (90 BASE) MCG/ACT inhaler Inhale 2 puffs into the lungs every 6 (six) hours as needed for wheezing or shortness of breath.    Marland Kitchen apixaban (ELIQUIS) 5 MG TABS tablet Take 1 tablet (5 mg total) by mouth 2 (two) times daily. 60 tablet 11  . atorvastatin (LIPITOR) 40 MG tablet  Take 1 tablet (40 mg total) by mouth daily. 30 tablet 11  . carvedilol (COREG) 12.5 MG tablet Take 1 tablet (12.5 mg total) by mouth 2 (two) times daily with a meal. 60 tablet 11  . enalapril (VASOTEC) 10 MG tablet Take 1 tablet (10 mg total) by mouth daily. 30 tablet 11  . furosemide (LASIX) 40 MG tablet Take 1 tablet (40 mg total) by mouth 2 (two) times daily. 60 tablet 11  . Insulin Glargine (LANTUS SOLOSTAR) 100 UNIT/ML Solostar Pen Inject 50 Units into the skin at bedtime.    . isosorbide-hydrALAZINE (BIDIL) 20-37.5 MG tablet Take 2 tablets by mouth 3 (three) times daily. 90 tablet 11  . metFORMIN (GLUCOPHAGE)  1000 MG tablet Take 1,000 mg by mouth 2 (two) times daily with a meal.    . potassium chloride SA (K-DUR,KLOR-CON) 20 MEQ tablet Take 1 tablet (20 mEq total) by mouth 2 (two) times daily. 60 tablet 11  . rizatriptan (MAXALT) 5 MG tablet Take 5 mg by mouth 3 (three) times daily.  1  . spironolactone (ALDACTONE) 25 MG tablet Take 1 tablet (25 mg total) by mouth daily. 30 tablet 11   No current facility-administered medications for this encounter.    Filed Vitals:   08/15/15 1016  BP: 110/62  Pulse: 84  Weight: 221 lb 4 oz (100.358 kg)  SpO2: 99%   General: NAD Neck: JVP 7, no thyromegaly or thyroid nodule.  Lungs: Clear to auscultation bilaterally with normal respiratory effort. CV: Nondisplaced PMI.  Heart regular tachy S1/S2, no S3/S4, no murmur. No edema.  No carotid bruit.  Normal pedal pulses.  Abdomen: Soft, nontender, no hepatosplenomegaly, no distention.  Skin: Intact without lesions or rashes.  Neurologic: Alert and oriented x 3.   Psych: Normal affect. Extremities: No clubbing or cyanosis.   Assessment/Plan: 1. Chronic systolic CHF: Nonischemic cardiomyopathy. S/P CRT-D (Medtronic) placed 10/14. EF most recently 40-45% (6/16).  NYHA class II symptoms.  Volume looks ok by exam and Optivol now that she is back on her meds.  She is mainly limited by joint pain currently.  - Volume looks ok, continue current Lasix.  - Continue current Bidil, Coreg, enalapril, and spironolactone at current doses. - I will arrange for repeat echo.  - Needs followup in device clinic.  2. Hyperlipidemia: On atorvastatin.   3. HTN: Currently controlled.  4. Restrictive PFTs: Pattern is suggestive of a parenchymal problem, ?interstitial fibrosis.  I will get a high resolution CT of the chest (had ordered in the past but never done).    Followup in 3 months.   [name] 08/15/2015

## 2015-08-15 NOTE — Patient Instructions (Signed)
Will schedule you for an echocardiogram at [facility]. Address: [address] ([address] Floor), [place], [place] [address]  Phone: [phone]  Will schedule you for high resolution chest CT at [facility].  Routine lab work today. Will notify you of abnormal results, otherwise no news is good news!  Follow up 3 months with Dr. Aundra Dubin.  Do the following things EVERYDAY: 1) Weigh yourself in the morning before breakfast. Write it down and keep it in a log. 2) Take your medicines as prescribed 3) Eat low salt foods-Limit salt (sodium) to 2000 mg per day.  4) Stay as active as you can everyday 5) Limit all fluids for the day to less than 2 liters

## 2015-08-20 ENCOUNTER — Ambulatory Visit (HOSPITAL_COMMUNITY): Admission: RE | Admit: 2015-08-20 | Payer: Medicare Other | Source: Ambulatory Visit

## 2015-08-29 ENCOUNTER — Ambulatory Visit (HOSPITAL_COMMUNITY)
Admission: RE | Admit: 2015-08-29 | Discharge: 2015-08-29 | Disposition: A | Discharge status: Home / Self Care | Payer: Medicare Other | Source: Ambulatory Visit | Attending: Cardiology | Admitting: Cardiology

## 2015-08-29 DIAGNOSIS — Z720 Tobacco use: Secondary | ICD-10-CM | POA: Diagnosis not present

## 2015-08-29 DIAGNOSIS — I428 Other cardiomyopathies: Secondary | ICD-10-CM | POA: Insufficient documentation

## 2015-08-29 DIAGNOSIS — I071 Rheumatic tricuspid insufficiency: Secondary | ICD-10-CM | POA: Insufficient documentation

## 2015-08-29 DIAGNOSIS — I2510 Atherosclerotic heart disease of native coronary artery without angina pectoris: Secondary | ICD-10-CM | POA: Diagnosis not present

## 2015-08-29 DIAGNOSIS — I509 Heart failure, unspecified: Secondary | ICD-10-CM | POA: Diagnosis present

## 2015-08-29 DIAGNOSIS — E119 Type 2 diabetes mellitus without complications: Secondary | ICD-10-CM | POA: Diagnosis not present

## 2015-08-29 DIAGNOSIS — I420 Dilated cardiomyopathy: Secondary | ICD-10-CM | POA: Insufficient documentation

## 2015-08-29 NOTE — Progress Notes (Signed)
Echocardiogram 2D Echocardiogram has been performed.  Joelene Millin 08/29/2015, 10:34 AM

## 2015-09-04 ENCOUNTER — Other Ambulatory Visit (HOSPITAL_COMMUNITY): Payer: Self-pay | Admitting: Radiology

## 2015-09-04 ENCOUNTER — Other Ambulatory Visit (HOSPITAL_COMMUNITY): Payer: Self-pay | Admitting: Emergency Medicine

## 2015-09-04 ENCOUNTER — Emergency Department (HOSPITAL_COMMUNITY): Payer: Medicare Other

## 2015-09-04 ENCOUNTER — Emergency Department (HOSPITAL_COMMUNITY)
Admission: EM | Admit: 2015-09-04 | Discharge: 2015-09-04 | Disposition: A | Discharge status: Home / Self Care | Payer: Medicare Other | Attending: Emergency Medicine | Admitting: Emergency Medicine

## 2015-09-04 ENCOUNTER — Other Ambulatory Visit (HOSPITAL_COMMUNITY): Payer: Medicare Other

## 2015-09-04 ENCOUNTER — Encounter (HOSPITAL_COMMUNITY): Payer: Self-pay | Admitting: Emergency Medicine

## 2015-09-04 DIAGNOSIS — Z87891 Personal history of nicotine dependence: Secondary | ICD-10-CM | POA: Diagnosis not present

## 2015-09-04 DIAGNOSIS — I2510 Atherosclerotic heart disease of native coronary artery without angina pectoris: Secondary | ICD-10-CM | POA: Diagnosis not present

## 2015-09-04 DIAGNOSIS — S8002XA Contusion of left knee, initial encounter: Secondary | ICD-10-CM

## 2015-09-04 DIAGNOSIS — Z23 Encounter for immunization: Secondary | ICD-10-CM | POA: Insufficient documentation

## 2015-09-04 DIAGNOSIS — Z95810 Presence of automatic (implantable) cardiac defibrillator: Secondary | ICD-10-CM | POA: Insufficient documentation

## 2015-09-04 DIAGNOSIS — I110 Hypertensive heart disease with heart failure: Secondary | ICD-10-CM | POA: Diagnosis not present

## 2015-09-04 DIAGNOSIS — Y999 Unspecified external cause status: Secondary | ICD-10-CM | POA: Diagnosis not present

## 2015-09-04 DIAGNOSIS — Y9234 Swimming pool (public) as the place of occurrence of the external cause: Secondary | ICD-10-CM | POA: Insufficient documentation

## 2015-09-04 DIAGNOSIS — W010XXA Fall on same level from slipping, tripping and stumbling without subsequent striking against object, initial encounter: Secondary | ICD-10-CM | POA: Diagnosis not present

## 2015-09-04 DIAGNOSIS — Y939 Activity, unspecified: Secondary | ICD-10-CM | POA: Insufficient documentation

## 2015-09-04 DIAGNOSIS — Z794 Long term (current) use of insulin: Secondary | ICD-10-CM | POA: Insufficient documentation

## 2015-09-04 DIAGNOSIS — E119 Type 2 diabetes mellitus without complications: Secondary | ICD-10-CM | POA: Diagnosis not present

## 2015-09-04 DIAGNOSIS — S81012A Laceration without foreign body, left knee, initial encounter: Secondary | ICD-10-CM | POA: Diagnosis not present

## 2015-09-04 DIAGNOSIS — Z7984 Long term (current) use of oral hypoglycemic drugs: Secondary | ICD-10-CM | POA: Insufficient documentation

## 2015-09-04 DIAGNOSIS — R52 Pain, unspecified: Secondary | ICD-10-CM

## 2015-09-04 DIAGNOSIS — Z79899 Other long term (current) drug therapy: Secondary | ICD-10-CM | POA: Insufficient documentation

## 2015-09-04 DIAGNOSIS — W19XXXA Unspecified fall, initial encounter: Secondary | ICD-10-CM

## 2015-09-04 DIAGNOSIS — I5042 Chronic combined systolic (congestive) and diastolic (congestive) heart failure: Secondary | ICD-10-CM | POA: Diagnosis not present

## 2015-09-04 MED ORDER — HYDROCODONE-ACETAMINOPHEN 5-325 MG PO TABS: 1.0000 | ORAL_TABLET | ORAL | 0 refills | Status: DC | PRN

## 2015-09-04 MED ORDER — LIDOCAINE-EPINEPHRINE (PF) 1 %-1:200000 IJ SOLN
10.0000 mL | Freq: Once | INTRAMUSCULAR | Status: AC
  Administered 2015-09-04: 10 mL
  Filled 2015-09-04: qty 30

## 2015-09-04 MED ORDER — NAPROXEN 500 MG PO TABS: 500.0000 mg | ORAL_TABLET | Freq: Two times a day (BID) | ORAL | 0 refills | Status: DC

## 2015-09-04 MED ORDER — TETANUS-DIPHTH-ACELL PERTUSSIS 5-2.5-18.5 LF-MCG/0.5 IM SUSP
0.5000 mL | Freq: Once | INTRAMUSCULAR | Status: AC
  Administered 2015-09-04: 0.5 mL via INTRAMUSCULAR
  Filled 2015-09-04: qty 0.5

## 2015-09-04 MED ORDER — OXYCODONE-ACETAMINOPHEN 5-325 MG PO TABS
1.0000 | ORAL_TABLET | Freq: Once | ORAL | Status: AC
  Administered 2015-09-04: 1 via ORAL
  Filled 2015-09-04: qty 1

## 2015-09-04 MED ORDER — DIAZEPAM 5 MG PO TABS
5.0000 mg | ORAL_TABLET | Freq: Once | ORAL | Status: AC
  Administered 2015-09-04: 5 mg via ORAL
  Filled 2015-09-04: qty 1

## 2015-09-04 NOTE — ED Provider Notes (Signed)
[place] DEPT Provider Note   CSN: [id number] Arrival date & time: 09/04/15  [id number]  First Provider Contact:  None     History   Chief Complaint Chief Complaint  Patient presents with  . Fall  . Knee Pain   HPI  [name] is an 65 y.o. female with history of CAD, CHF, HTN, HLD, DM, ICD in place, now on Elliquis who presents to the ED for evaluation after a mechanical fall. She states she was taking the trash out this morning when she slipped and fell straight down onto her left knee. She states she did not hit her head or lose consciousness. She states her pain is severe. She states she cannot move her knee because it hurts so much. Denies new numbness or tingling. She has not taken anything for her pain today but typically takes oxycodone for chronic pain. She is on Elliquis. She states she is not sure when her last tetanus shot was.   Past Medical History:  Diagnosis Date  . Anemia    a. Noted on 07/2012 labs, instructed to f/u PCP.  Marland Kitchen Arthritis    "joints" (11/18/2012)  . Automatic implantable cardioverter-defibrillator in situ   . CAD (coronary artery disease), native coronary artery    a. Nonobstructive by cath 02/2012 (done because of low EF).  . Chronic bronchitis ([place])    "~ every other year" (11/18/2012)  . Chronic combined systolic and diastolic CHF (congestive heart failure) ([place])    a. 03/05/12 echo:  LVEF 20-25%, moderate LVH , inferior and basal to mid septal akinesis, anterior moderate to severe hypokinesis and grade 2 diastolic dysfunction. b. EF 07/2012: EF still 25% (unclear medication compliance).  . Chronic lower back pain   . Headache(784.0)    "often; maybe not daily" (11/18/2012)  . High cholesterol   . History of noncompliance with medical treatment   . Hypertension   . LBBB (left bundle branch block)   . Orthopnea   . Tobacco abuse   . Type II diabetes mellitus [facility])     Patient Active Problem List   Diagnosis Date Noted  . Dyspnea 07/19/2015    . Interstitial lung disease ([place]) 11/20/2014  . SIRS (systemic inflammatory response syndrome) ([place]) 02/03/2014  . DM (diabetes mellitus) ([place]) 02/03/2014  . Tachycardia 06/14/2013  . Chronic systolic CHF (congestive heart failure) ([place]) 09/29/2012  . Hypoxia 03/06/2012  . Hypertensive heart disease 03/06/2012  . Nonischemic cardiomyopathy ([place]) 03/06/2012  . Tobacco abuse 03/06/2012  . Type 2 diabetes mellitus ([place]) 03/06/2012  . Essential hypertension 03/06/2012  . CAD (coronary artery disease), native coronary artery 03/06/2012  . Acute on chronic combined systolic and diastolic heart failure ([place]) 03/06/2012  . Hypokalemia 03/06/2012  . Acute bronchitis 02/01/2009  . Sleep apnea 02/01/2009  . CHEST PAIN 02/01/2009    Past Surgical History:  Procedure Laterality Date  . BI-VENTRICULAR IMPLANTABLE CARDIOVERTER DEFIBRILLATOR N/A 11/18/2012   Procedure: BI-VENTRICULAR IMPLANTABLE CARDIOVERTER DEFIBRILLATOR  (CRT-D);  Surgeon: Evans Lance, MD;  Location[facility] C[facility] LAB;  Service: Cardiovascular;  Laterality: N/A;  . BI-VENTRICULAR IMPLANTABLE CARDIOVERTER DEFIBRILLATOR  (CRT-D)  11/18/2012  . CARDIAC CATHETERIZATION  03/04/12   nonobstructive CAD, elevated LVEDP and tortuous vessels suggestive of long-standing hypertension  . JOINT REPLACEMENT     Bilateral hip and right knee  . LEFT HEART CATH N/A 03/05/2012   Procedure: LEFT HEART CATH;  Surgeon: Larey Dresser, MD;  Location: [facility] CATH LAB;  Service: Cardiovascular;  Laterality: N/A;  OB History    No data available       Home Medications    Prior to Admission medications   Medication Sig Start Date End Date Taking? Authorizing Provider  albuterol (PROVENTIL HFA;VENTOLIN HFA) 108 (90 BASE) MCG/ACT inhaler Inhale 2 puffs into the lungs every 6 (six) hours as needed for wheezing or shortness of breath.    Historical Provider, MD  apixaban (ELIQUIS) 5 MG TABS tablet Take 1 tablet (5 mg total) by mouth 2 (two) times daily.  07/20/15   Cheryln Manly, NP  atorvastatin (LIPITOR) 40 MG tablet Take 1 tablet (40 mg total) by mouth daily. 07/20/15   Cheryln Manly, NP  carvedilol (COREG) 12.5 MG tablet Take 1 tablet (12.5 mg total) by mouth 2 (two) times daily with a meal. 07/20/15   Cheryln Manly, NP  enalapril (VASOTEC) 10 MG tablet Take 1 tablet (10 mg total) by mouth daily. 07/20/15   Cheryln Manly, NP  furosemide (LASIX) 40 MG tablet Take 1 tablet (40 mg total) by mouth 2 (two) times daily. 07/20/15   Cheryln Manly, NP  Insulin Glargine (LANTUS SOLOSTAR) 100 UNIT/ML Solostar Pen Inject 50 Units into the skin at bedtime.    Historical Provider, MD  isosorbide-hydrALAZINE (BIDIL) 20-37.5 MG tablet Take 2 tablets by mouth 3 (three) times daily. 07/20/15   Cheryln Manly, NP  metFORMIN (GLUCOPHAGE) 1000 MG tablet Take 1,000 mg by mouth 2 (two) times daily with a meal.    Historical Provider, MD  potassium chloride SA (K-DUR,KLOR-CON) 20 MEQ tablet Take 1 tablet (20 mEq total) by mouth 2 (two) times daily. 07/20/15   Cheryln Manly, NP  rizatriptan (MAXALT) 5 MG tablet Take 5 mg by mouth 3 (three) times daily. 06/17/14   Historical Provider, MD  spironolactone (ALDACTONE) 25 MG tablet Take 1 tablet (25 mg total) by mouth daily. 07/20/15   Cheryln Manly, NP    Family History Family History  Problem Relation Age of Onset  . Heart disease Neg Hx     Social History Social History  Substance Use Topics  . Smoking status: Former Smoker    Packs/day: 1.00    Years: 40.00    Types: Cigarettes    Start date: 06/23/1972    Quit date: 03/26/2012  . Smokeless tobacco: Never Used  . Alcohol use No     Allergies   Morphine and related   Review of Systems Review of Systems  All other systems reviewed and are negative.    Physical Exam Updated Vital Signs BP 165/87 (BP Location: Left Arm)   Pulse 83   Temp 98.1 F (36.7 C) (Oral)   Resp 16   SpO2 100%   Physical Exam  Constitutional: She  is oriented to person, place, and time. No distress.  HENT:  Head: Atraumatic.  Right Ear: External ear normal.  Left Ear: External ear normal.  Nose: Nose normal.  Eyes: Conjunctivae are normal. No scleral icterus.  Cardiovascular: Normal rate and regular rhythm.   Pulmonary/Chest: Effort normal. No respiratory distress.  Abdominal: She exhibits no distension.  Musculoskeletal:  Five cm V-shaped laceration to frontal aspect of L knee with exposed subcutaneous tissue. Bleeding is well controlled. No foreign bodies visualized or palpated.  L knee otherwise diffusely tender and moderately edematous. Very limited ROM due to pain. 2+ DP  Neurological: She is alert and oriented to person, place, and time.  Skin: Skin is warm and dry. She is not diaphoretic.  Psychiatric: She has a normal mood and affect. Her behavior is normal.  Nursing note and vitals reviewed.    ED Treatments / Results  Labs (all labs ordered are listed, but only abnormal results are displayed) Labs Reviewed - No data to display  EKG  EKG Interpretation None       Radiology Ct Knee Left Wo Contrast  Result Date: 09/04/2015 CLINICAL DATA:  [place] today. Knee pain. Large joint effusion. Evaluate for occult fracture. EXAM: CT OF THE left KNEE WITHOUT CONTRAST TECHNIQUE: Multidetector CT imaging of the left knee was performed according to the standard protocol. Multiplanar CT image reconstructions were also generated. COMPARISON:  Radiographs 07/05/2015 FINDINGS: Bones/Joint/Cartilage Age advanced tricompartmental degenerative changes with joint space narrowing, osteophytic spurring, subchondral cystic change and bony eburnation. No acute fracture is identified. Ligaments Suboptimally assessed by CT. Muscles and Tendons The quadriceps and patellar tendons appear intact. Soft tissues Anterior soft tissue swelling may be due to direct trauma. The moderate to large joint effusion is noted but no lipohemarthrosis. IMPRESSION:  1. Age advanced tricompartmental degenerative changes. 2. No acute fracture is identified. 3. Anterior soft tissue swelling likely due to direct trauma. 4. Moderate to large joint effusion but no lipohemarthrosis. Electronically Signed   By: Marijo Sanes M.D.   On: 09/04/2015 12:30   Dg Knee Complete 4 Views Left  Result Date: 09/04/2015 CLINICAL DATA:  65 year old female with a history of fall EXAM: LEFT KNEE - COMPLETE 4+ VIEW COMPARISON:  None. FINDINGS: No displaced fracture identified. Osteopenia. Lateral view demonstrates large joint effusion. Prepatellar soft tissue swelling. Nearly bone-on-bone articulation of the lateral joint space. Narrowing of the medial joint space. Advanced marginal osteophyte formation laterally. Degenerative changes at the patellofemoral joint. IMPRESSION: No displaced fracture, however, there is a large joint effusion. If there is concern for occult bony abnormality, MRI may be considered. Advanced tricompartmental osteoarthritis. Signed, Dulcy Fanny. Earleen Newport, DO [facility] Radiology Electronically Signed   By: Corrie Mckusick D.O.   On: 09/04/2015 09:47   Mr Attempted Daymon Larsen Report  Result Date: 09/04/2015 This examination belongs to an outside facility and is stored here for comparison purposes only.  Contact the originating outside institution for any associated report or interpretation.   Procedures Procedures (including critical care time)  LACERATION REPAIR Performed by: Delrae Rend Authorized by: Delrae Rend Consent: Verbal consent obtained. Risks and benefits: risks, benefits and alternatives were discussed Consent given by: patient Patient identity confirmed: provided demographic data Prepped and Draped in normal sterile fashion Wound explored  Laceration Location: left knee  Laceration Length: 5 cm  No Foreign Bodies seen or palpated  Anesthesia: local infiltration  Local anesthetic: lidocaine 1%  with epinephrine  Anesthetic total: 8 ml  Irrigation method: syringe Amount of cleaning: standard  Skin closure: 4-0 prolene   Number of sutures: 5  Technique: horizontal mattress  Patient tolerance: Patient tolerated the procedure well with no immediate complications.   Medications Ordered in ED Medications  diazepam (VALIUM) tablet 5 mg (5 mg Oral Given 09/04/15 0908)  oxyCODONE-acetaminophen (PERCOCET/ROXICET) 5-325 MG per tablet 1 tablet (1 tablet Oral Given 09/04/15 0908)  lidocaine-EPINEPHrine (XYLOCAINE-EPINEPHrine) 1 %-1:200000 (PF) injection 10 mL (10 mLs Infiltration Given 09/04/15 0920)  Tdap (BOOSTRIX) injection 0.5 mL (0.5 mLs Intramuscular Given 09/04/15 0909)     Initial Impression / Assessment and Plan / ED Course  I have reviewed the triage vital signs and the nursing notes.  Pertinent labs & imaging results that were  available during my care of the patient were reviewed by me and considered in my medical decision making (see chart for details).  Clinical Course    Pt tolerated lac repair well. Pain is improved in the ED. X-ray reveals a large joint effusion. No obvious or displaced fracture. However, MRI recommended if there is suspicion for occult fracture. Given age, mechanism of injury, degree of pain/tenderness, limited ROM, I am suspicious for occult injury. We will obtain MRI in the ED.  MRI with no acute fracture. There is a large, likely traumatic, joint effusion but no hemarthrosis. Pt tolerated lac repair well. Pt has wheelchair and walker at home. She does not want crutches. Care instructions discussed. Pt to return in 10 days for wound check/suture removal and to f/u with ortho. Rx for pain meds given. ER return precautions given. Tdap updated.  Final Clinical Impressions(s) / ED Diagnoses   Final diagnoses:  Fall  Knee laceration, left, initial encounter  Contusion of left knee, initial encounter    New Prescriptions Discharge Medication List as of  09/04/2015  1:30 PM    START taking these medications   Details  HYDROcodone-acetaminophen (NORCO/VICODIN) 5-325 MG tablet Take 1 tablet by mouth every 4 (four) hours as needed for severe pain., Starting Tue 09/04/2015, Print    naproxen (NAPROSYN) 500 MG tablet Take 1 tablet (500 mg total) by mouth 2 (two) times daily., Starting Tue 09/04/2015, Print         Anne Ng, PA-C 09/06/15 [id number]    Carmin Muskrat, MD 09/10/15 [phone]

## 2015-09-04 NOTE — ED Triage Notes (Signed)
Patient here from home with complaints of fall today. Injury to left knee. Pain 4/10. Reports slipping and falling into pool of water outside home.

## 2015-09-04 NOTE — ED Notes (Signed)
Pt provided with ham sandwich, cheese stick, and coke.

## 2015-09-04 NOTE — Discharge Instructions (Signed)
Your x-ray and CT scan showed evidence of no fracture. You do have quite a bit of swelling. Keep ice on your knee on and off for the next 48 hrs. Take pain medication as prescribed as needed for pain. Return to the ER in 10 days for suture removal. Call Dr. Laurena Bering office as needed for orthopedic follow up.

## 2015-09-05 ENCOUNTER — Telehealth (HOSPITAL_COMMUNITY): Payer: Self-pay | Admitting: *Deleted

## 2015-09-05 DIAGNOSIS — R942 Abnormal results of pulmonary function studies: Secondary | ICD-10-CM

## 2015-09-05 NOTE — Telephone Encounter (Signed)
Called pt w/echo results, she is aware.  Pt was also supposed to have a high res CT of chest but missed that appt.  Will resch and call her back with date/time, she fell yesterday and injured her knee so she is requesting to move the CT out a couple of week so her knee can heal.

## 2015-09-05 NOTE — Telephone Encounter (Signed)
Ct sch for 8/23 at 9:45 am, pt aware

## 2015-09-13 ENCOUNTER — Emergency Department (HOSPITAL_COMMUNITY): Payer: Medicare Other

## 2015-09-13 ENCOUNTER — Emergency Department (HOSPITAL_COMMUNITY)
Admission: EM | Admit: 2015-09-13 | Discharge: 2015-09-13 | Disposition: A | Discharge status: Home / Self Care | Payer: Medicare Other | Attending: Emergency Medicine | Admitting: Emergency Medicine

## 2015-09-13 ENCOUNTER — Encounter (HOSPITAL_COMMUNITY): Payer: Self-pay

## 2015-09-13 DIAGNOSIS — W1839XA Other fall on same level, initial encounter: Secondary | ICD-10-CM | POA: Insufficient documentation

## 2015-09-13 DIAGNOSIS — Y999 Unspecified external cause status: Secondary | ICD-10-CM | POA: Diagnosis not present

## 2015-09-13 DIAGNOSIS — Z791 Long term (current) use of non-steroidal anti-inflammatories (NSAID): Secondary | ICD-10-CM | POA: Insufficient documentation

## 2015-09-13 DIAGNOSIS — L089 Local infection of the skin and subcutaneous tissue, unspecified: Secondary | ICD-10-CM

## 2015-09-13 DIAGNOSIS — I2510 Atherosclerotic heart disease of native coronary artery without angina pectoris: Secondary | ICD-10-CM | POA: Diagnosis not present

## 2015-09-13 DIAGNOSIS — Z794 Long term (current) use of insulin: Secondary | ICD-10-CM | POA: Diagnosis not present

## 2015-09-13 DIAGNOSIS — Y939 Activity, unspecified: Secondary | ICD-10-CM | POA: Insufficient documentation

## 2015-09-13 DIAGNOSIS — W19XXXA Unspecified fall, initial encounter: Secondary | ICD-10-CM

## 2015-09-13 DIAGNOSIS — I5042 Chronic combined systolic (congestive) and diastolic (congestive) heart failure: Secondary | ICD-10-CM | POA: Diagnosis not present

## 2015-09-13 DIAGNOSIS — Z7984 Long term (current) use of oral hypoglycemic drugs: Secondary | ICD-10-CM | POA: Insufficient documentation

## 2015-09-13 DIAGNOSIS — I110 Hypertensive heart disease with heart failure: Secondary | ICD-10-CM | POA: Insufficient documentation

## 2015-09-13 DIAGNOSIS — Y92000 Kitchen of unspecified non-institutional (private) residence as  the place of occurrence of the external cause: Secondary | ICD-10-CM | POA: Diagnosis not present

## 2015-09-13 DIAGNOSIS — Z95810 Presence of automatic (implantable) cardiac defibrillator: Secondary | ICD-10-CM | POA: Diagnosis not present

## 2015-09-13 DIAGNOSIS — Y69 Unspecified misadventure during surgical and medical care: Secondary | ICD-10-CM | POA: Diagnosis not present

## 2015-09-13 DIAGNOSIS — Z87891 Personal history of nicotine dependence: Secondary | ICD-10-CM | POA: Diagnosis not present

## 2015-09-13 DIAGNOSIS — E119 Type 2 diabetes mellitus without complications: Secondary | ICD-10-CM | POA: Diagnosis not present

## 2015-09-13 DIAGNOSIS — S81012A Laceration without foreign body, left knee, initial encounter: Secondary | ICD-10-CM | POA: Diagnosis not present

## 2015-09-13 DIAGNOSIS — R52 Pain, unspecified: Secondary | ICD-10-CM | POA: Insufficient documentation

## 2015-09-13 DIAGNOSIS — M7918 Myalgia, other site: Secondary | ICD-10-CM

## 2015-09-13 DIAGNOSIS — T148XXA Other injury of unspecified body region, initial encounter: Secondary | ICD-10-CM

## 2015-09-13 DIAGNOSIS — S8992XA Unspecified injury of left lower leg, initial encounter: Secondary | ICD-10-CM | POA: Diagnosis present

## 2015-09-13 MED ORDER — CEPHALEXIN 500 MG PO CAPS: 500.0000 mg | ORAL_CAPSULE | Freq: Four times a day (QID) | ORAL | 0 refills | Status: DC

## 2015-09-13 MED ORDER — OXYCODONE-ACETAMINOPHEN 5-325 MG PO TABS: 1.0000 | ORAL_TABLET | ORAL | 0 refills | Status: DC | PRN

## 2015-09-13 MED ORDER — SULFAMETHOXAZOLE-TRIMETHOPRIM 800-160 MG PO TABS
1.0000 | ORAL_TABLET | Freq: Two times a day (BID) | ORAL | 0 refills | Status: AC
Start: 2015-09-13 — End: 2015-09-20

## 2015-09-13 MED ORDER — SULFAMETHOXAZOLE-TRIMETHOPRIM 800-160 MG PO TABS
1.0000 | ORAL_TABLET | Freq: Once | ORAL | Status: AC
  Administered 2015-09-13: 1 via ORAL
  Filled 2015-09-13: qty 1

## 2015-09-13 MED ORDER — CEPHALEXIN 250 MG PO CAPS
250.0000 mg | ORAL_CAPSULE | Freq: Once | ORAL | Status: AC
  Administered 2015-09-13: 250 mg via ORAL
  Filled 2015-09-13 (×2): qty 1

## 2015-09-13 MED ORDER — OXYCODONE-ACETAMINOPHEN 5-325 MG PO TABS
2.0000 | ORAL_TABLET | Freq: Once | ORAL | Status: AC
  Administered 2015-09-13: 2 via ORAL
  Filled 2015-09-13: qty 2

## 2015-09-13 NOTE — ED Triage Notes (Signed)
Pt presents with c/o fall and left knee pain. Pt had stitches placed in her knee last week after a fall and fell again today on that same knee.

## 2015-09-13 NOTE — Discharge Instructions (Signed)
Please keep the elevated, and cleaned. Please use antibiotics as directed. Please use prescribed pain medication only as needed for severe pain. Please follow-up with orthopedic specialist for further evaluation of your knee. Please follow-up in the emergency room in 2 days for reevaluation of your wound.

## 2015-09-13 NOTE — ED Provider Notes (Signed)
[facility] DEPT Provider Note   CSN: [id number] Arrival date & time: 09/13/15  1726  By signing my name below, I, Shanna Cisco, attest that this documentation has been prepared under the direction and in the presence of [employer], PA-C. Electronically signed by: Shanna Cisco, ED Scribe. 09/13/15. 5:53 PM.    History   Chief Complaint Chief Complaint  Patient presents with  . Knee Pain    The history is provided by the patient. No language interpreter was used.   HPI Comments:  [name] is a 65 y.o. female with a history of HTN, who presents to the Emergency Department complaining of left knee and buttock pain status post fall, which occurred this morning. Pt reports that she fell in the kitchen today onto her rear. Associated symptoms include tenderness in left hip and rear with palpation, difficulty sitting and standing up. Denies decreased sensations, weakness or numbness in lower extremities.  Pt also reports that she was seen in the ED last week after a fall and received stitches on the left knee. Pt believes that her stitches are loosening. Associated symptoms include edema and small amount of discharge. Pt has been cleaning area with rubbing alcohol. Denies fever.    Past Medical History:  Diagnosis Date  . Anemia    a. Noted on 07/2012 labs, instructed to f/u PCP.  Marland Kitchen Arthritis    "joints" (11/18/2012)  . Automatic implantable cardioverter-defibrillator in situ   . CAD (coronary artery disease), native coronary artery    a. Nonobstructive by cath 02/2012 (done because of low EF).  . Chronic bronchitis ([place])    "~ every other year" (11/18/2012)  . Chronic combined systolic and diastolic CHF (congestive heart failure) ([place])    a. 03/05/12 echo:  LVEF 20-25%, moderate LVH , inferior and basal to mid septal akinesis, anterior moderate to severe hypokinesis and grade 2 diastolic dysfunction. b. EF 07/2012: EF still 25% (unclear medication compliance).  . Chronic lower  back pain   . Headache(784.0)    "often; maybe not daily" (11/18/2012)  . High cholesterol   . History of noncompliance with medical treatment   . Hypertension   . LBBB (left bundle branch block)   . Orthopnea   . Tobacco abuse   . Type II diabetes mellitus [facility])     Patient Active Problem List   Diagnosis Date Noted  . Dyspnea 07/19/2015  . Interstitial lung disease ([place]) 11/20/2014  . SIRS (systemic inflammatory response syndrome) ([place]) 02/03/2014  . DM (diabetes mellitus) ([place]) 02/03/2014  . Tachycardia 06/14/2013  . Chronic systolic CHF (congestive heart failure) ([place]) 09/29/2012  . Hypoxia 03/06/2012  . Hypertensive heart disease 03/06/2012  . Nonischemic cardiomyopathy ([place]) 03/06/2012  . Tobacco abuse 03/06/2012  . Type 2 diabetes mellitus ([place]) 03/06/2012  . Essential hypertension 03/06/2012  . CAD (coronary artery disease), native coronary artery 03/06/2012  . Acute on chronic combined systolic and diastolic heart failure ([place]) 03/06/2012  . Hypokalemia 03/06/2012  . Acute bronchitis 02/01/2009  . Sleep apnea 02/01/2009  . CHEST PAIN 02/01/2009    Past Surgical History:  Procedure Laterality Date  . BI-VENTRICULAR IMPLANTABLE CARDIOVERTER DEFIBRILLATOR N/A 11/18/2012   Procedure: BI-VENTRICULAR IMPLANTABLE CARDIOVERTER DEFIBRILLATOR  (CRT-D);  Surgeon: Evans Lance, MD;  Location: [facility] CATH LAB;  Service: Cardiovascular;  Laterality: N/A;  . BI-VENTRICULAR IMPLANTABLE CARDIOVERTER DEFIBRILLATOR  (CRT-D)  11/18/2012  . CARDIAC CATHETERIZATION  03/04/12   nonobstructive CAD, elevated LVEDP and tortuous vessels suggestive of long-standing hypertension  .  JOINT REPLACEMENT     Bilateral hip and right knee  . LEFT HEART CATH N/A 03/05/2012   Procedure: LEFT HEART CATH;  Surgeon: Larey Dresser, MD;  Location: [facility] CATH LAB;  Service: Cardiovascular;  Laterality: N/A;    OB History    No data available       Home Medications    Prior to Admission medications     Medication Sig Start Date End Date Taking? Authorizing Provider  albuterol (PROVENTIL HFA;VENTOLIN HFA) 108 (90 BASE) MCG/ACT inhaler Inhale 2 puffs into the lungs every 6 (six) hours as needed for wheezing or shortness of breath.    Historical Provider, MD  apixaban (ELIQUIS) 5 MG TABS tablet Take 1 tablet (5 mg total) by mouth 2 (two) times daily. 07/20/15   Cheryln Manly, NP  atorvastatin (LIPITOR) 40 MG tablet Take 1 tablet (40 mg total) by mouth daily. 07/20/15   Cheryln Manly, NP  carvedilol (COREG) 12.5 MG tablet Take 1 tablet (12.5 mg total) by mouth 2 (two) times daily with a meal. 07/20/15   Cheryln Manly, NP  cephALEXin (KEFLEX) 500 MG capsule Take 1 capsule (500 mg total) by mouth 4 (four) times daily. 09/13/15   Okey Regal, PA-C  enalapril (VASOTEC) 10 MG tablet Take 1 tablet (10 mg total) by mouth daily. 07/20/15   Cheryln Manly, NP  furosemide (LASIX) 40 MG tablet Take 1 tablet (40 mg total) by mouth 2 (two) times daily. 07/20/15   Cheryln Manly, NP  HYDROcodone-acetaminophen (NORCO/VICODIN) 5-325 MG tablet Take 1 tablet by mouth every 4 (four) hours as needed for severe pain. 09/04/15   Olivia Canter Sam, PA-C  Insulin Glargine (LANTUS SOLOSTAR) 100 UNIT/ML Solostar Pen Inject 50 Units into the skin at bedtime.    Historical Provider, MD  isosorbide-hydrALAZINE (BIDIL) 20-37.5 MG tablet Take 2 tablets by mouth 3 (three) times daily. 07/20/15   Cheryln Manly, NP  metFORMIN (GLUCOPHAGE) 1000 MG tablet Take 1,000 mg by mouth 2 (two) times daily with a meal.    Historical Provider, MD  naproxen (NAPROSYN) 500 MG tablet Take 1 tablet (500 mg total) by mouth 2 (two) times daily. 09/04/15   Anne Ng, PA-C  oxyCODONE-acetaminophen (PERCOCET/ROXICET) 5-325 MG tablet Take 1 tablet by mouth every 4 (four) hours as needed for severe pain. 09/13/15   Okey Regal, PA-C  potassium chloride SA (K-DUR,KLOR-CON) 20 MEQ tablet Take 1 tablet (20 mEq total) by mouth 2 (two) times daily.  07/20/15   Cheryln Manly, NP  rizatriptan (MAXALT) 5 MG tablet Take 5 mg by mouth 2 (two) times daily.  06/17/14   Historical Provider, MD  spironolactone (ALDACTONE) 25 MG tablet Take 1 tablet (25 mg total) by mouth daily. 07/20/15   Cheryln Manly, NP  sulfamethoxazole-trimethoprim (BACTRIM DS,SEPTRA DS) 800-160 MG tablet Take 1 tablet by mouth 2 (two) times daily. 09/13/15 09/20/15  Okey Regal, PA-C    Family History Family History  Problem Relation Age of Onset  . Heart disease Neg Hx     Social History Social History  Substance Use Topics  . Smoking status: Former Smoker    Packs/day: 1.00    Years: 40.00    Types: Cigarettes    Start date: 06/23/1972    Quit date: 03/26/2012  . Smokeless tobacco: Never Used  . Alcohol use No     Allergies   Morphine and related   Review of Systems Review of Systems  Constitutional: Negative for  fever.  Musculoskeletal: Positive for arthralgias and myalgias.  Skin: Positive for wound.  Neurological: Negative for weakness and numbness.  All other systems reviewed and are negative.    Physical Exam Updated Vital Signs BP 181/68 (BP Location: Right Arm)   Pulse 94   Temp 98.3 F (36.8 C) (Oral)   Resp 20   Ht 5\' 7"  (1.702 m)   Wt 100.2 kg   SpO2 98%   BMI 34.61 kg/m   Physical Exam  Constitutional: She is oriented to person, place, and time. She appears well-developed and well-nourished. No distress.  HENT:  Head: Normocephalic.  Neck: Normal range of motion. Neck supple.  Pulmonary/Chest: Effort normal.  Musculoskeletal: Normal range of motion. She exhibits edema and tenderness.  Tender to palpation of the posterior hip bilateral and sacrum. No signs of obvious trauma.  Knee flexes to 90 degrees with full extension. Joint effusion noted.  No C, T, or L spine tenderness to palpation. No obvious signs of trauma, deformity, infection, step-offs. Lung expansion normal. No scoliosis or kyphosis. Bilateral lower  extremity strength 5 out of 5, sensation grossly intact, patellar reflexes 2+, pedal pulses 2+, Refill less than 3 seconds.  Straight leg negative  Neurological: She is alert and oriented to person, place, and time.  Skin: Skin is warm and dry. She is not diaphoretic.  Laceration on left knee with small amount of discharge. No surrounding cellulitis.   Psychiatric: She has a normal mood and affect. Her behavior is normal. Judgment and thought content normal.  Nursing note and vitals reviewed.    ED Treatments / Results  DIAGNOSTIC STUDIES:  Oxygen Saturation is 98% on room air, normal by my interpretation.    COORDINATION OF CARE:  5:45 PM Discussed treatment plan with pt at bedside, which includes antibiotics and follow up with orthopedic doctor, and pt agreed to plan.  Labs (all labs ordered are listed, but only abnormal results are displayed) Labs Reviewed - No data to display  EKG  EKG Interpretation None       Radiology Dg Sacrum/coccyx  Result Date: 09/13/2015 CLINICAL DATA:  Status post fall on tailbone, pain. EXAM: SACRUM AND COCCYX - 2+ VIEW COMPARISON:  None. FINDINGS: There is no evidence of fracture or other focal bone lesions. IMPRESSION: Negative. Electronically Signed   By: Franki Cabot M.D.   On: 09/13/2015 18:41   Dg Hips Bilat With Pelvis 2v  Result Date: 09/13/2015 CLINICAL DATA:  Fall on tail bone, pain, chronic low back pain. History of bilateral hip replacements. EXAM: DG HIP (WITH OR WITHOUT PELVIS) 2V BILAT COMPARISON:  None. FINDINGS: Single view of the pelvis and two views of each hip are provided. There are surgical changes of bilateral hip replacements. Hardware appears intact and normally aligned. Osseous structures of the pelvis appear intact and normally aligned throughout. No fracture line or displaced fracture fragment seen. Mild degenerative change noted at the symphysis pubis. Soft tissues about the pelvis and bilateral hips are  unremarkable. IMPRESSION: No acute findings. No osseous fracture or dislocation. Bilateral hip replacement hardware appears intact and appropriately positioned. Electronically Signed   By: Franki Cabot M.D.   On: 09/13/2015 18:41    Procedures Procedures (including critical care time)  Medications Ordered in ED Medications  sulfamethoxazole-trimethoprim (BACTRIM DS,SEPTRA DS) 800-160 MG per tablet 1 tablet (1 tablet Oral Given 09/13/15 1843)  cephALEXin (KEFLEX) capsule 250 mg (250 mg Oral Given 09/13/15 1843)  oxyCODONE-acetaminophen (PERCOCET/ROXICET) 5-325 MG per tablet 2 tablet (2  tablets Oral Given 09/13/15 1852)     Initial Impression / Assessment and Plan / ED Course  I have reviewed the triage vital signs and the nursing notes.  Pertinent labs & imaging results that were available during my care of the patient were reviewed by me and considered in my medical decision making (see chart for details).  Clinical Course    Labs:   Imaging: DG hip, DG sacrum  Consults:   Therapeutics:   Discharge Meds: Keflex and Bactrim.  Assessment/Plan:    65 year old female presents today with multiple complaints. Complaint 1 is that she fell today landing on her butt. She reports pain at the sacrum and posterior hips. She is able to ambulate with minimal difficulty, low suspicion for fracture, will open and plain films here in the ED. No other injuries noted from the fall.  Patient also here for wound recheck. She has what appears to be infection of the laceration to her left knee. She has very minimal discharge from the wound, no surrounding cellulitis, no abscess. Patient does have swelling to the knee status post trauma. She has 90 of flexion with near full extension, have very low suspicion for septic arthritis in this patient. Due to discharge from the wound, sutures will be taken out to open the wound, irrigated, and left open to heal by secondary intention. Patient will need close  follow-up as she is high risk for infection. I will instruct her to return to the emergency room in 2 days for reevaluation. She has reassuring vital signs here in the ED.   No acute findings on plain films here. Patient ambulates with normal difficulty. Strict return percussion skin, she verbalized understanding and agreement to today's plan.  Final Clinical Impressions(s) / ED Diagnoses   Final diagnoses:  Fall  Wound infection ([place])  Buttock pain    New Prescriptions Discharge Medication List as of 09/13/2015  7:33 PM    START taking these medications   Details  cephALEXin (KEFLEX) 500 MG capsule Take 1 capsule (500 mg total) by mouth 4 (four) times daily., Starting Thu 09/13/2015, Print    oxyCODONE-acetaminophen (PERCOCET/ROXICET) 5-325 MG tablet Take 1 tablet by mouth every 4 (four) hours as needed for severe pain., Starting Thu 09/13/2015, Print    sulfamethoxazole-trimethoprim (BACTRIM DS,SEPTRA DS) 800-160 MG tablet Take 1 tablet by mouth 2 (two) times daily., Starting Thu 09/13/2015, Until Thu 09/20/2015, Print      I personally performed the services described in this documentation, which was scribed in my presence. The recorded information has been reviewed and is accurate.    Okey Regal, PA-C 09/13/15 2214    Charlesetta Shanks, MD 09/22/15 [phone]

## 2015-09-13 NOTE — Progress Notes (Addendum)
PA in with the pt. Left knee with creamy yellow exudate and very warm to the touch. Pt has swelling to the knee cap and down to the foot. Pt is able to move her knee but c/o severe pain. Pt stated she was not on any antiboitics after the injury. PA will send pt for an x-ray and a light dressing applied./ non adaptive dressing applied with kling. Pt also given some supplies. Instructed to return if pain gets worse or knee is worse with swelling and exudate

## 2015-09-14 ENCOUNTER — Emergency Department (HOSPITAL_COMMUNITY)
Admission: EM | Admit: 2015-09-14 | Discharge: 2015-09-15 | Disposition: A | Discharge status: Home / Self Care | Payer: Medicare Other | Attending: Emergency Medicine | Admitting: Emergency Medicine

## 2015-09-14 ENCOUNTER — Encounter (HOSPITAL_COMMUNITY): Payer: Self-pay

## 2015-09-14 DIAGNOSIS — E119 Type 2 diabetes mellitus without complications: Secondary | ICD-10-CM | POA: Insufficient documentation

## 2015-09-14 DIAGNOSIS — R112 Nausea with vomiting, unspecified: Secondary | ICD-10-CM

## 2015-09-14 DIAGNOSIS — I5042 Chronic combined systolic (congestive) and diastolic (congestive) heart failure: Secondary | ICD-10-CM | POA: Insufficient documentation

## 2015-09-14 DIAGNOSIS — I110 Hypertensive heart disease with heart failure: Secondary | ICD-10-CM | POA: Insufficient documentation

## 2015-09-14 DIAGNOSIS — Z87891 Personal history of nicotine dependence: Secondary | ICD-10-CM | POA: Insufficient documentation

## 2015-09-14 DIAGNOSIS — I2510 Atherosclerotic heart disease of native coronary artery without angina pectoris: Secondary | ICD-10-CM | POA: Diagnosis not present

## 2015-09-14 DIAGNOSIS — K30 Functional dyspepsia: Secondary | ICD-10-CM | POA: Insufficient documentation

## 2015-09-14 DIAGNOSIS — Z95810 Presence of automatic (implantable) cardiac defibrillator: Secondary | ICD-10-CM | POA: Diagnosis not present

## 2015-09-14 DIAGNOSIS — Z791 Long term (current) use of non-steroidal anti-inflammatories (NSAID): Secondary | ICD-10-CM | POA: Insufficient documentation

## 2015-09-14 DIAGNOSIS — Z794 Long term (current) use of insulin: Secondary | ICD-10-CM | POA: Insufficient documentation

## 2015-09-14 DIAGNOSIS — R079 Chest pain, unspecified: Secondary | ICD-10-CM | POA: Insufficient documentation

## 2015-09-14 DIAGNOSIS — Z7984 Long term (current) use of oral hypoglycemic drugs: Secondary | ICD-10-CM | POA: Diagnosis not present

## 2015-09-14 MED ORDER — ONDANSETRON 4 MG PO TBDP
4.0000 mg | ORAL_TABLET | Freq: Once | ORAL | Status: AC | PRN
  Administered 2015-09-14: 4 mg via ORAL
  Filled 2015-09-14: qty 1

## 2015-09-14 NOTE — ED Notes (Signed)
PT STS HER ABDOMINAL PAIN WITH N/V STARTED AFTER SHE TOOK KEFLEX 500MG  AND NAPROXEN 500MG  THIS MORNING. PT STS SHE TOOK THE PILLS ON AN EMPTY STOMACH. SHE THEN ATE AFTERWARDS, AND THAT IS WHEN EVERYTHING STARTED.

## 2015-09-14 NOTE — ED Triage Notes (Signed)
Pt transported from home by EMS with c/o emesis since this am. Zofran 4mg  IM given by EMS

## 2015-09-15 DIAGNOSIS — K30 Functional dyspepsia: Secondary | ICD-10-CM | POA: Diagnosis not present

## 2015-09-15 MED ORDER — ACETAMINOPHEN 500 MG PO TABS: 500.0000 mg | ORAL_TABLET | Freq: Four times a day (QID) | ORAL | 0 refills | Status: DC | PRN

## 2015-09-15 MED ORDER — GI COCKTAIL ~~LOC~~
30.0000 mL | Freq: Once | ORAL | Status: AC
  Administered 2015-09-15: 30 mL via ORAL
  Filled 2015-09-15: qty 30

## 2015-09-15 NOTE — ED Provider Notes (Signed)
[facility] DEPT Provider Note   CSN: [id number] Arrival date & time: 09/14/15  2200 By signing my name below, I, Gwenlyn Fudge, attest that this documentation has been prepared under the direction and in the presence of Leo Grosser, MD. Electronically Signed: Gwenlyn Fudge, ED Scribe. 09/15/15. 12:34 AM.  History   Chief Complaint Chief Complaint  Patient presents with  . Emesis   The history is provided by the patient. No language interpreter was used.    HPI Comments: [name] is a 65 y.o. female who presents to the Emergency Department complaining of generalized abdominal pain onset today. Pt states she took her first dose of Keflex and Naproxen for her knee right before eating. She reports associated abdominal pain, chest pain, nausea, vomiting. Pt states she was seen yesterday in the ED for her left knee wound and the stitches were removed. She states she had pus-like drainage from left knee wound yesterday.  Past Medical History:  Diagnosis Date  . Anemia    a. Noted on 07/2012 labs, instructed to f/u PCP.  Marland Kitchen Arthritis    "joints" (11/18/2012)  . Automatic implantable cardioverter-defibrillator in situ   . CAD (coronary artery disease), native coronary artery    a. Nonobstructive by cath 02/2012 (done because of low EF).  . Chronic bronchitis ([place])    "~ every other year" (11/18/2012)  . Chronic combined systolic and diastolic CHF (congestive heart failure) ([place])    a. 03/05/12 echo:  LVEF 20-25%, moderate LVH , inferior and basal to mid septal akinesis, anterior moderate to severe hypokinesis and grade 2 diastolic dysfunction. b. EF 07/2012: EF still 25% (unclear medication compliance).  . Chronic lower back pain   . Headache(784.0)    "often; maybe not daily" (11/18/2012)  . High cholesterol   . History of noncompliance with medical treatment   . Hypertension   . LBBB (left bundle branch block)   . Orthopnea   . Tobacco abuse   . Type II diabetes mellitus [facility])      Patient Active Problem List   Diagnosis Date Noted  . Dyspnea 07/19/2015  . Interstitial lung disease ([place]) 11/20/2014  . SIRS (systemic inflammatory response syndrome) ([place]) 02/03/2014  . DM (diabetes mellitus) ([place]) 02/03/2014  . Tachycardia 06/14/2013  . Chronic systolic CHF (congestive heart failure) ([place]) 09/29/2012  . Hypoxia 03/06/2012  . Hypertensive heart disease 03/06/2012  . Nonischemic cardiomyopathy ([place]) 03/06/2012  . Tobacco abuse 03/06/2012  . Type 2 diabetes mellitus ([place]) 03/06/2012  . Essential hypertension 03/06/2012  . CAD (coronary artery disease), native coronary artery 03/06/2012  . Acute on chronic combined systolic and diastolic heart failure ([place]) 03/06/2012  . Hypokalemia 03/06/2012  . Acute bronchitis 02/01/2009  . Sleep apnea 02/01/2009  . CHEST PAIN 02/01/2009    Past Surgical History:  Procedure Laterality Date  . BI-VENTRICULAR IMPLANTABLE CARDIOVERTER DEFIBRILLATOR N/A 11/18/2012   Procedure: BI-VENTRICULAR IMPLANTABLE CARDIOVERTER DEFIBRILLATOR  (CRT-D);  Surgeon: Evans Lance, MD;  Location: [facility] CATH LAB;  Service: Cardiovascular;  Laterality: N/A;  . BI-VENTRICULAR IMPLANTABLE CARDIOVERTER DEFIBRILLATOR  (CRT-D)  11/18/2012  . CARDIAC CATHETERIZATION  03/04/12   nonobstructive CAD, elevated LVEDP and tortuous vessels suggestive of long-standing hypertension  . JOINT REPLACEMENT     Bilateral hip and right knee  . LEFT HEART CATH N/A 03/05/2012   Procedure: LEFT HEART CATH;  Surgeon: Larey Dresser, MD;  Location: [facility] CATH LAB;  Service: Cardiovascular;  Laterality: N/A;    OB History    No  data available       Home Medications    Prior to Admission medications   Medication Sig Start Date End Date Taking? Authorizing Provider  albuterol (PROVENTIL HFA;VENTOLIN HFA) 108 (90 BASE) MCG/ACT inhaler Inhale 2 puffs into the lungs every 6 (six) hours as needed for wheezing or shortness of breath.    Historical Provider, MD  apixaban  (ELIQUIS) 5 MG TABS tablet Take 1 tablet (5 mg total) by mouth 2 (two) times daily. 07/20/15   Cheryln Manly, NP  atorvastatin (LIPITOR) 40 MG tablet Take 1 tablet (40 mg total) by mouth daily. 07/20/15   Cheryln Manly, NP  carvedilol (COREG) 12.5 MG tablet Take 1 tablet (12.5 mg total) by mouth 2 (two) times daily with a meal. 07/20/15   Cheryln Manly, NP  cephALEXin (KEFLEX) 500 MG capsule Take 1 capsule (500 mg total) by mouth 4 (four) times daily. 09/13/15   Okey Regal, PA-C  enalapril (VASOTEC) 10 MG tablet Take 1 tablet (10 mg total) by mouth daily. 07/20/15   Cheryln Manly, NP  furosemide (LASIX) 40 MG tablet Take 1 tablet (40 mg total) by mouth 2 (two) times daily. 07/20/15   Cheryln Manly, NP  HYDROcodone-acetaminophen (NORCO/VICODIN) 5-325 MG tablet Take 1 tablet by mouth every 4 (four) hours as needed for severe pain. 09/04/15   Olivia Canter Sam, PA-C  Insulin Glargine (LANTUS SOLOSTAR) 100 UNIT/ML Solostar Pen Inject 50 Units into the skin at bedtime.    Historical Provider, MD  isosorbide-hydrALAZINE (BIDIL) 20-37.5 MG tablet Take 2 tablets by mouth 3 (three) times daily. 07/20/15   Cheryln Manly, NP  metFORMIN (GLUCOPHAGE) 1000 MG tablet Take 1,000 mg by mouth 2 (two) times daily with a meal.    Historical Provider, MD  naproxen (NAPROSYN) 500 MG tablet Take 1 tablet (500 mg total) by mouth 2 (two) times daily. 09/04/15   Anne Ng, PA-C  oxyCODONE-acetaminophen (PERCOCET/ROXICET) 5-325 MG tablet Take 1 tablet by mouth every 4 (four) hours as needed for severe pain. 09/13/15   Okey Regal, PA-C  potassium chloride SA (K-DUR,KLOR-CON) 20 MEQ tablet Take 1 tablet (20 mEq total) by mouth 2 (two) times daily. 07/20/15   Cheryln Manly, NP  rizatriptan (MAXALT) 5 MG tablet Take 5 mg by mouth 2 (two) times daily.  06/17/14   Historical Provider, MD  spironolactone (ALDACTONE) 25 MG tablet Take 1 tablet (25 mg total) by mouth daily. 07/20/15   Cheryln Manly, NP   sulfamethoxazole-trimethoprim (BACTRIM DS,SEPTRA DS) 800-160 MG tablet Take 1 tablet by mouth 2 (two) times daily. 09/13/15 09/20/15  Okey Regal, PA-C    Family History Family History  Problem Relation Age of Onset  . Heart disease Neg Hx     Social History Social History  Substance Use Topics  . Smoking status: Former Smoker    Packs/day: 1.00    Years: 40.00    Types: Cigarettes    Start date: 06/23/1972    Quit date: 03/26/2012  . Smokeless tobacco: Never Used  . Alcohol use No    Allergies   Morphine and related  Review of Systems Review of Systems  Constitutional: Negative for fever.  Cardiovascular: Positive for chest pain.  Gastrointestinal: Positive for abdominal pain, nausea and vomiting.  Skin: Positive for wound.  All other systems reviewed and are negative.  Physical Exam Updated Vital Signs BP 186/91 (BP Location: Left Arm)   Pulse 80   Temp 98.8 F (37.1 C) (Oral)  Resp 20   Ht 5\' 7"  (1.702 m)   Wt 221 lb (100.2 kg)   SpO2 99%   BMI 34.61 kg/m   Physical Exam  Constitutional: She appears well-developed and well-nourished.  HENT:  Head: Normocephalic.  Eyes: Conjunctivae are normal.  Cardiovascular: Normal rate.   Pulmonary/Chest: Effort normal. No respiratory distress.  Abdominal: She exhibits no distension.  Mild diffuse tenderness, difficult examination pt was anxious  Musculoskeletal: Normal range of motion.  Neurological: She is alert.  Skin: Skin is warm and dry.  Psychiatric: She has a normal mood and affect. Her behavior is normal.  Nursing note and vitals reviewed.   ED Treatments / Results  DIAGNOSTIC STUDIES: Oxygen Saturation is 99% on RA, normal by my interpretation.    COORDINATION OF CARE: 12:32 AM Discussed treatment plan with pt at bedside which includes GI Cocktail and pt agreed to plan.  Labs (all labs ordered are listed, but only abnormal results are displayed) Labs Reviewed - No data to display  EKG  EKG  Interpretation None      Radiology Dg Sacrum/coccyx  Result Date: 09/13/2015 CLINICAL DATA:  Status post fall on tailbone, pain. EXAM: SACRUM AND COCCYX - 2+ VIEW COMPARISON:  None. FINDINGS: There is no evidence of fracture or other focal bone lesions. IMPRESSION: Negative. Electronically Signed   By: Franki Cabot M.D.   On: 09/13/2015 18:41   Dg Hips Bilat With Pelvis 2v  Result Date: 09/13/2015 CLINICAL DATA:  Fall on tail bone, pain, chronic low back pain. History of bilateral hip replacements. EXAM: DG HIP (WITH OR WITHOUT PELVIS) 2V BILAT COMPARISON:  None. FINDINGS: Single view of the pelvis and two views of each hip are provided. There are surgical changes of bilateral hip replacements. Hardware appears intact and normally aligned. Osseous structures of the pelvis appear intact and normally aligned throughout. No fracture line or displaced fracture fragment seen. Mild degenerative change noted at the symphysis pubis. Soft tissues about the pelvis and bilateral hips are unremarkable. IMPRESSION: No acute findings. No osseous fracture or dislocation. Bilateral hip replacement hardware appears intact and appropriately positioned. Electronically Signed   By: Franki Cabot M.D.   On: 09/13/2015 18:41    Procedures Procedures (including critical care time)  Medications Ordered in ED Medications  ondansetron (ZOFRAN-ODT) disintegrating tablet 4 mg (4 mg Oral Given 09/14/15 2244)     Initial Impression / Assessment and Plan / ED Course  I have reviewed the triage vital signs and the nursing notes.  Pertinent labs & imaging results that were available during my care of the patient were reviewed by me and considered in my medical decision making (see chart for details).  Clinical Course    65 y.o. female presents with diffuse abdominal discomfort after taking naproxen and first does of keflex for a knee wound. I explained the naproxen may have caused a mild GI irritation. Exam  reassuring for lack of surgical etiology.  Pt was provided gi cocktail for symptoms. She requested food and drink afterward. I recommended not taking meds on an empty stomach and d/c of naproxen if she feels this worsened symptoms. Plan to follow up with PCP as needed and return precautions discussed for worsening or new concerning symptoms.   Final Clinical Impressions(s) / ED Diagnoses   Final diagnoses:  Non-intractable vomiting with nausea, vomiting of unspecified type  Upset stomach    New Prescriptions New Prescriptions   No medications on file   I personally performed the services  described in this documentation, which was scribed in my presence. The recorded information has been reviewed and is accurate.     Leo Grosser, MD 09/15/15 2051

## 2015-09-19 ENCOUNTER — Ambulatory Visit (HOSPITAL_COMMUNITY): Payer: Medicare Other

## 2015-09-19 ENCOUNTER — Ambulatory Visit (HOSPITAL_COMMUNITY): Admission: RE | Admit: 2015-09-19 | Payer: Medicare Other | Source: Ambulatory Visit

## 2015-09-26 ENCOUNTER — Other Ambulatory Visit (HOSPITAL_COMMUNITY): Payer: Medicare Other

## 2015-10-13 ENCOUNTER — Emergency Department (HOSPITAL_COMMUNITY)
Admission: EM | Admit: 2015-10-13 | Discharge: 2015-10-13 | Disposition: A | Discharge status: Home / Self Care | Payer: Medicare Other | Attending: Emergency Medicine | Admitting: Emergency Medicine

## 2015-10-13 ENCOUNTER — Encounter (HOSPITAL_COMMUNITY): Payer: Self-pay | Admitting: Nurse Practitioner

## 2015-10-13 ENCOUNTER — Emergency Department (HOSPITAL_COMMUNITY): Payer: Medicare Other

## 2015-10-13 DIAGNOSIS — R079 Chest pain, unspecified: Secondary | ICD-10-CM | POA: Diagnosis present

## 2015-10-13 DIAGNOSIS — Z96651 Presence of right artificial knee joint: Secondary | ICD-10-CM | POA: Insufficient documentation

## 2015-10-13 DIAGNOSIS — Z96643 Presence of artificial hip joint, bilateral: Secondary | ICD-10-CM | POA: Insufficient documentation

## 2015-10-13 DIAGNOSIS — I2510 Atherosclerotic heart disease of native coronary artery without angina pectoris: Secondary | ICD-10-CM | POA: Insufficient documentation

## 2015-10-13 DIAGNOSIS — Z87891 Personal history of nicotine dependence: Secondary | ICD-10-CM | POA: Insufficient documentation

## 2015-10-13 DIAGNOSIS — E1165 Type 2 diabetes mellitus with hyperglycemia: Secondary | ICD-10-CM | POA: Insufficient documentation

## 2015-10-13 DIAGNOSIS — I110 Hypertensive heart disease with heart failure: Secondary | ICD-10-CM | POA: Diagnosis not present

## 2015-10-13 DIAGNOSIS — R0789 Other chest pain: Secondary | ICD-10-CM

## 2015-10-13 DIAGNOSIS — I5042 Chronic combined systolic (congestive) and diastolic (congestive) heart failure: Secondary | ICD-10-CM | POA: Insufficient documentation

## 2015-10-13 DIAGNOSIS — Z7984 Long term (current) use of oral hypoglycemic drugs: Secondary | ICD-10-CM | POA: Insufficient documentation

## 2015-10-13 DIAGNOSIS — Z794 Long term (current) use of insulin: Secondary | ICD-10-CM | POA: Diagnosis not present

## 2015-10-13 DIAGNOSIS — Z7901 Long term (current) use of anticoagulants: Secondary | ICD-10-CM | POA: Insufficient documentation

## 2015-10-13 DIAGNOSIS — R739 Hyperglycemia, unspecified: Secondary | ICD-10-CM

## 2015-10-13 LAB — CBC WITH DIFFERENTIAL/PLATELET
Basophils Absolute: 0 10*3/uL (ref 0.0–0.1)
Basophils Relative: 0 %
Eosinophils Absolute: 0.1 10*3/uL (ref 0.0–0.7)
Eosinophils Relative: 1 %
HCT: 36.4 % (ref 36.0–46.0)
Hemoglobin: 12.6 g/dL (ref 12.0–15.0)
Lymphocytes Relative: 27 %
Lymphs Abs: 2.2 10*3/uL (ref 0.7–4.0)
MCH: 30.8 pg (ref 26.0–34.0)
MCHC: 34.6 g/dL (ref 30.0–36.0)
MCV: 89 fL (ref 78.0–100.0)
Monocytes Absolute: 0.5 10*3/uL (ref 0.1–1.0)
Monocytes Relative: 6 %
Neutro Abs: 5.6 10*3/uL (ref 1.7–7.7)
Neutrophils Relative %: 66 %
Platelets: 376 10*3/uL (ref 150–400)
RBC: 4.09 MIL/uL (ref 3.87–5.11)
RDW: 12.9 % (ref 11.5–15.5)
WBC: 8.4 10*3/uL (ref 4.0–10.5)

## 2015-10-13 LAB — BASIC METABOLIC PANEL
Anion gap: 10 (ref 5–15)
BUN: 14 mg/dL (ref 6–20)
CO2: 29 mmol/L (ref 22–32)
Calcium: 9.5 mg/dL (ref 8.9–10.3)
Chloride: 96 mmol/L — ABNORMAL LOW (ref 101–111)
Creatinine, Ser: 1.08 mg/dL — ABNORMAL HIGH (ref 0.44–1.00)
GFR calc Af Amer: >60 mL/min (ref >60)
GFR calc non Af Amer: 54 mL/min — ABNORMAL LOW (ref >60)
Glucose, Bld: 505 mg/dL (ref 65–99)
Potassium: 3.2 mmol/L — ABNORMAL LOW (ref 3.5–5.1)
Sodium: 135 mmol/L (ref 135–145)

## 2015-10-13 LAB — I-STAT TROPONIN, ED: Troponin i, poc: 0.01 ng/mL (ref 0.00–0.08)

## 2015-10-13 LAB — CBG MONITORING, ED: Glucose-Capillary: 177 mg/dL — ABNORMAL HIGH (ref 65–99)

## 2015-10-13 MED ORDER — FENTANYL CITRATE (PF) 100 MCG/2ML IJ SOLN
50.0000 ug | Freq: Once | INTRAMUSCULAR | Status: AC
  Administered 2015-10-13: 50 ug via INTRAVENOUS
  Filled 2015-10-13: qty 2

## 2015-10-13 MED ORDER — OXYCODONE-ACETAMINOPHEN 5-325 MG PO TABS
1.0000 | ORAL_TABLET | Freq: Once | ORAL | Status: DC
  Filled 2015-10-13: qty 1

## 2015-10-13 MED ORDER — POTASSIUM CHLORIDE CRYS ER 20 MEQ PO TBCR
40.0000 meq | EXTENDED_RELEASE_TABLET | Freq: Once | ORAL | Status: AC
  Administered 2015-10-13: 40 meq via ORAL
  Filled 2015-10-13: qty 2

## 2015-10-13 MED ORDER — ASPIRIN 325 MG PO TABS
325.0000 mg | ORAL_TABLET | Freq: Once | ORAL | Status: AC
  Administered 2015-10-13: 325 mg via ORAL
  Filled 2015-10-13: qty 1

## 2015-10-13 MED ORDER — TRAMADOL HCL 50 MG PO TABS: 50.0000 mg | ORAL_TABLET | Freq: Four times a day (QID) | ORAL | 0 refills | Status: DC | PRN

## 2015-10-13 MED ORDER — INSULIN ASPART 100 UNIT/ML ~~LOC~~ SOLN
8.0000 [IU] | Freq: Once | SUBCUTANEOUS | Status: AC
  Administered 2015-10-13: 8 [IU] via INTRAVENOUS
  Filled 2015-10-13: qty 1

## 2015-10-13 MED ORDER — SODIUM CHLORIDE 0.9 % IV BOLUS (SEPSIS)
1000.0000 mL | Freq: Once | INTRAVENOUS | Status: AC
  Administered 2015-10-13: 1000 mL via INTRAVENOUS

## 2015-10-13 NOTE — ED Notes (Signed)
Pt appears distressed c/o severe left chest pain, under left breast, sharp and increased with movement. Reported to PA.

## 2015-10-13 NOTE — ED Provider Notes (Signed)
MSE was initiated and I personally evaluated the patient and placed orders (if any) at  3:12 PM on October 13, 2015.  [name] is a 65 y.o. female with h/o CAD, CHF, HTN, DM, with pacemaker in place who presents to the Emergency Department complaining of 10/10, sharp, left-sided anterior chest pain onset one day ago. Pt states the pain came on after cleaning a deep freezer. Pain is worse with palpation, which may indicate chest wall pain, though it also hurts with other movements and walking around.  Pt is sitting upright in a chair and cannot be fully examined while in fast track.    EKG, CXR, troponin, CBC, BMP ordered.  Pt to be moved to main ED for full evaluation.      The patient appears stable so that the remainder of the MSE may be completed by another provider.   Clayton Bibles, PA-C 10/13/15 1514    Sherwood Gambler, MD 10/15/15 [phone]

## 2015-10-13 NOTE — ED Triage Notes (Signed)
Pt presents with c/o L side rib pain. Onset after she was cleaning a deep freezer, with repetitive bending and pulling motions. She feels like she pulled a muscle. The pain is made worse by movement and inspiration. She has tried tylenol and aleve with no relief. She is alert and breathing easily.

## 2015-10-13 NOTE — Discharge Instructions (Signed)
Take your pain medications as prescribed as needed for pain relief. He may also apply ice to affected area for 15 minutes 3-4 times daily for pain relief. Continue taking your home medications as prescribed. Continue monitoring your glucose throughout the day and taking her insulin as prescribed. Follow-up with your primary care provider within the next 3-5 days. Please return to the Emergency Department if symptoms worsen or new onset of fever, lightheadedness, dizziness, difficulty breathing, coughing up blood, palpitations, abdominal pain, vomiting.

## 2015-10-13 NOTE — ED Provider Notes (Signed)
[facility] DEPT Provider Note   CSN: [id number] Arrival date & time: 10/13/15  1444     History   Chief Complaint Chief Complaint  Patient presents with  . Muscle Pain  . Chest Pain    HPI [name] is a 65 y.o. female.  Patient is a 65 year old female with past medical history of CAD, CHF, hypertension, diabetes, LBBB and pacemaker presents the ED with complaint of left-sided chest pain, onset yesterday. Patient reports yesterday afternoon while she was cleaning her deep freezer, she notes she was bending over applying pressure on the left side of her chest when she began to have sharp left-sided chest pain. Patient states the pain has remained constant and has worsened today. She also reports having a productive cough that started yesterday after her chest pain started but notes she has had bronchitis over the past few weeks. She notes the pain is worse with coughing, palpation, bending or taking a deep breath. She reports taking Tylenol at home without relief. Denies fever, chills, lightheadedness, dizziness, headache, difficulty breathing, wheezing, hemoptysis, palpitations, abdominal pain, nausea, vomiting, numbness, tingling, weakness, diaphoresis. Denies history of similar chest pain in the past.       Past Medical History:  Diagnosis Date  . Anemia    a. Noted on 07/2012 labs, instructed to f/u PCP.  Marland Kitchen Arthritis    "joints" (11/18/2012)  . Automatic implantable cardioverter-defibrillator in situ   . CAD (coronary artery disease), native coronary artery    a. Nonobstructive by cath 02/2012 (done because of low EF).  . Chronic bronchitis ([place])    "~ every other year" (11/18/2012)  . Chronic combined systolic and diastolic CHF (congestive heart failure) ([place])    a. 03/05/12 echo:  LVEF 20-25%, moderate LVH , inferior and basal to mid septal akinesis, anterior moderate to severe hypokinesis and grade 2 diastolic dysfunction. b. EF 07/2012: EF still 25% (unclear medication  compliance).  . Chronic lower back pain   . Headache(784.0)    "often; maybe not daily" (11/18/2012)  . High cholesterol   . History of noncompliance with medical treatment   . Hypertension   . LBBB (left bundle branch block)   . Orthopnea   . Tobacco abuse   . Type II diabetes mellitus [facility])     Patient Active Problem List   Diagnosis Date Noted  . Dyspnea 07/19/2015  . Interstitial lung disease ([place]) 11/20/2014  . SIRS (systemic inflammatory response syndrome) ([place]) 02/03/2014  . DM (diabetes mellitus) ([place]) 02/03/2014  . Tachycardia 06/14/2013  . Chronic systolic CHF (congestive heart failure) ([place]) 09/29/2012  . Hypoxia 03/06/2012  . Hypertensive heart disease 03/06/2012  . Nonischemic cardiomyopathy ([place]) 03/06/2012  . Tobacco abuse 03/06/2012  . Type 2 diabetes mellitus ([place]) 03/06/2012  . Essential hypertension 03/06/2012  . CAD (coronary artery disease), native coronary artery 03/06/2012  . Acute on chronic combined systolic and diastolic heart failure ([place]) 03/06/2012  . Hypokalemia 03/06/2012  . Acute bronchitis 02/01/2009  . Sleep apnea 02/01/2009  . CHEST PAIN 02/01/2009    Past Surgical History:  Procedure Laterality Date  . BI-VENTRICULAR IMPLANTABLE CARDIOVERTER DEFIBRILLATOR N/A 11/18/2012   Procedure: BI-VENTRICULAR IMPLANTABLE CARDIOVERTER DEFIBRILLATOR  (CRT-D);  Surgeon: Evans Lance, MD;  Location: [facility] CATH LAB;  Service: Cardiovascular;  Laterality: N/A;  . BI-VENTRICULAR IMPLANTABLE CARDIOVERTER DEFIBRILLATOR  (CRT-D)  11/18/2012  . CARDIAC CATHETERIZATION  03/04/12   nonobstructive CAD, elevated LVEDP and tortuous vessels suggestive of long-standing hypertension  . JOINT REPLACEMENT  Bilateral hip and right knee  . LEFT HEART CATH N/A 03/05/2012   Procedure: LEFT HEART CATH;  Surgeon: Larey Dresser, MD;  Location: [facility] CATH LAB;  Service: Cardiovascular;  Laterality: N/A;    OB History    No data available       Home Medications    Prior  to Admission medications   Medication Sig Start Date End Date Taking? Authorizing Provider  acetaminophen (TYLENOL) 500 MG tablet Take 1 tablet (500 mg total) by mouth every 6 (six) hours as needed. 09/15/15  Yes Leo Grosser, MD  albuterol (PROVENTIL HFA;VENTOLIN HFA) 108 (90 BASE) MCG/ACT inhaler Inhale 2 puffs into the lungs every 6 (six) hours as needed for wheezing or shortness of breath.   Yes Historical Provider, MD  apixaban (ELIQUIS) 5 MG TABS tablet Take 1 tablet (5 mg total) by mouth 2 (two) times daily. 07/20/15  Yes Cheryln Manly, NP  atorvastatin (LIPITOR) 40 MG tablet Take 1 tablet (40 mg total) by mouth daily. 07/20/15  Yes Cheryln Manly, NP  carvedilol (COREG) 12.5 MG tablet Take 1 tablet (12.5 mg total) by mouth 2 (two) times daily with a meal. 07/20/15  Yes Cheryln Manly, NP  enalapril (VASOTEC) 10 MG tablet Take 1 tablet (10 mg total) by mouth daily. 07/20/15  Yes Cheryln Manly, NP  furosemide (LASIX) 40 MG tablet Take 1 tablet (40 mg total) by mouth 2 (two) times daily. 07/20/15  Yes Cheryln Manly, NP  Insulin Glargine (LANTUS SOLOSTAR) 100 UNIT/ML Solostar Pen Inject 50 Units into the skin at bedtime.   Yes Historical Provider, MD  isosorbide-hydrALAZINE (BIDIL) 20-37.5 MG tablet Take 2 tablets by mouth 3 (three) times daily. 07/20/15  Yes Cheryln Manly, NP  metFORMIN (GLUCOPHAGE) 1000 MG tablet Take 1,000 mg by mouth 2 (two) times daily with a meal.   Yes Historical Provider, MD  Oxycodone HCl 10 MG TABS Take 1-2 tablets by mouth 2 (two) times daily as needed (pain).  08/27/15  Yes Historical Provider, MD  potassium chloride SA (K-DUR,KLOR-CON) 20 MEQ tablet Take 1 tablet (20 mEq total) by mouth 2 (two) times daily. 07/20/15  Yes Cheryln Manly, NP  spironolactone (ALDACTONE) 25 MG tablet Take 1 tablet (25 mg total) by mouth daily. 07/20/15  Yes Cheryln Manly, NP  traMADol (ULTRAM) 50 MG tablet Take 1 tablet (50 mg total) by mouth every 6 (six) hours as  needed. 10/13/15   Nona Dell, PA-C    Family History Family History  Problem Relation Age of Onset  . Heart disease Neg Hx     Social History Social History  Substance Use Topics  . Smoking status: Former Smoker    Packs/day: 1.00    Years: 40.00    Types: Cigarettes    Start date: 06/23/1972    Quit date: 03/26/2012  . Smokeless tobacco: Never Used  . Alcohol use No     Allergies   Morphine and related   Review of Systems Review of Systems  Respiratory: Positive for cough.   Cardiovascular: Positive for chest pain.  All other systems reviewed and are negative.    Physical Exam Updated Vital Signs BP 173/81 (BP Location: Left Arm)   Pulse 77   Temp 98 F (36.7 C) (Oral)   Resp 22   Ht 5\' 7"  (1.702 m)   Wt 97.1 kg   SpO2 99%   BMI 33.52 kg/m   Physical Exam  Constitutional: She is oriented  to person, place, and time. She appears well-developed and well-nourished.  Pt appears to be in discomfort on exam.  HENT:  Head: Normocephalic and atraumatic.  Mouth/Throat: Oropharynx is clear and moist. No oropharyngeal exudate.  Eyes: Conjunctivae and EOM are normal. Right eye exhibits no discharge. Left eye exhibits no discharge. No scleral icterus.  Neck: Normal range of motion. Neck supple.  Cardiovascular: Normal rate, regular rhythm, normal heart sounds and intact distal pulses.   Pulmonary/Chest: Effort normal and breath sounds normal. No respiratory distress. She has no wheezes. She has no rales. She exhibits tenderness (TTP over left anterior inferior chest wall). She exhibits no crepitus, no edema, no deformity, no swelling and no retraction.  Abdominal: Soft. Bowel sounds are normal. She exhibits no distension and no mass. There is no tenderness. There is no rebound and no guarding. No hernia.  Musculoskeletal: Normal range of motion. She exhibits no edema.  Neurological: She is alert and oriented to person, place, and time.  Skin: Skin is warm  and dry.  Nursing note and vitals reviewed.    ED Treatments / Results  Labs (all labs ordered are listed, but only abnormal results are displayed) Labs Reviewed  BASIC METABOLIC PANEL - Abnormal; Notable for the following:       Result Value   Potassium 3.2 (*)    Chloride 96 (*)    Glucose, Bld 505 (*)    Creatinine, Ser 1.08 (*)    GFR calc non Af Amer 54 (*)    All other components within normal limits  CBG MONITORING, ED - Abnormal; Notable for the following:    Glucose-Capillary 177 (*)    All other components within normal limits  CBC WITH DIFFERENTIAL/PLATELET  Randolm Idol, ED    EKG  EKG Interpretation  Date/Time:  Saturday October 13 2015 15:17:59 EDT Ventricular Rate:  96 PR Interval:  174 QRS Duration: 136 QT Interval:  416 QTC Calculation: 525 R Axis:   10 Text Interpretation:  Normal sinus rhythm Non-specific intra-ventricular conduction block T wave abnormality, consider inferolateral ischemia Abnormal ECG Confirmed by Lacinda Axon  MD, BRIAN ([id number]) on 10/13/2015 4:55:10 PM       Radiology Dg Chest 2 View  Result Date: 10/13/2015 CLINICAL DATA:  Patient with left lower chest pain and shortness of breath for 2 days. EXAM: CHEST  2 VIEW COMPARISON:  Chest radiograph 07/19/2015 FINDINGS: Multi lead AICD device overlies the left hemithorax, leads are stable in position. Stable cardiomegaly. No consolidative pulmonary opacities. No pleural effusion or pneumothorax. Thoracic spine degenerative changes. IMPRESSION: No acute cardiopulmonary process. Electronically Signed   By: Lovey Newcomer M.D.   On: 10/13/2015 16:17    Procedures Procedures (including critical care time)  Medications Ordered in ED Medications  oxyCODONE-acetaminophen (PERCOCET/ROXICET) 5-325 MG per tablet 1 tablet (not administered)  aspirin tablet 325 mg (325 mg Oral Given 10/13/15 1649)  fentaNYL (SUBLIMAZE) injection 50 mcg (50 mcg Intravenous Given 10/13/15 1650)  sodium chloride 0.9 %  bolus 1,000 mL (0 mLs Intravenous Stopped 10/13/15 1919)  insulin aspart (novoLOG) injection 8 Units (8 Units Intravenous Given 10/13/15 1711)  potassium chloride SA (K-DUR,KLOR-CON) CR tablet 40 mEq (40 mEq Oral Given 10/13/15 1710)     Initial Impression / Assessment and Plan / ED Course  I have reviewed the triage vital signs and the nursing notes.  Pertinent labs & imaging results that were available during my care of the patient were reviewed by me and considered in my medical decision making (  see chart for details).  Clinical Course   Patient presented left-sided chest pain that started after bending on her chest to clean her freezer at home yesterday. Pain worse with bending, coughing or palpation. Endorses mild intermittent nonproductive cough and reports having bronchitis over the past week. Denies any other pain or complaints at this time. Patient reports she has been taking her home meds as prescribed and notes she takes her insulin once daily at night. VSS. Exam revealed exquisite tenderness over left inferior anterior chest wall with no obvious deformity or injury noted. Lungs clear to auscultation bilaterally. Remaining exam unremarkable. Patient given pain meds.  EKG showed sinus rhythm with nonspecific T wave abnormalities, no significant changes from prior, no acute ischemic changes. Troponin negative. Glucose 505, no anion gap. Potassium 3.2. Remaining labs unremarkable. Chest x-ray negative. Patient given pain meds, 1 L IV fluids, insulin and oral potassium.  On reevaluation patient is sitting and resting comfortably in bed. Tolerating PO. Repeat CBG 177. Suspect patient's chest pain is likely muscular skeletal in etiology associated with recent injury while cleaning out freezer. I have a low suspicion for ACS, PE, dissection, or other acute cardiac event at this time. Patient reports she is continuing to have chest pain. Patient given pain meds. Discussed with patient importance of  continuing to monitor her glucose level at home and continue taking her insulin as prescribed. Advised patient to follow up with her PCP this week for reevaluation regarding her chest pain and hyperglycemia. Discussed return precautions with patient.   Final Clinical Impressions(s) / ED Diagnoses   Final diagnoses:  Hyperglycemia  Chest wall pain    New Prescriptions New Prescriptions   TRAMADOL (ULTRAM) 50 MG TABLET    Take 1 tablet (50 mg total) by mouth every 6 (six) hours as needed.     Chesley Noon [place], [place] 10/13/15 [place], MD 10/13/15 2206

## 2015-11-09 ENCOUNTER — Encounter: Payer: Self-pay | Admitting: Internal Medicine

## 2015-12-05 ENCOUNTER — Telehealth: Payer: Self-pay | Admitting: Internal Medicine

## 2015-12-05 NOTE — Telephone Encounter (Signed)
Melissa from [facility] called to check on a document which was sent to Teri's attention on October 30 th on pt for document clarification. Melissa said that she  has not heard from [place] as yet. Melissa  Is aware that I will let Dr. Tanna Furry nurse know.   [facility] supervisor is aware and will look for the document.

## 2015-12-05 NOTE — Telephone Encounter (Signed)
New Message  Nisha voiced she's calling to get an update on a letter submitted and has not heard back from anyone as of yet.  Nisha voiced when calling to use reference number [id number] when calling.  Please f/u

## 2015-12-06 ENCOUNTER — Other Ambulatory Visit: Payer: Self-pay | Admitting: Internal Medicine

## 2015-12-06 DIAGNOSIS — Z1231 Encounter for screening mammogram for malignant neoplasm of breast: Secondary | ICD-10-CM

## 2015-12-06 NOTE — Telephone Encounter (Signed)
Spoke with [name] and she is going to refax what it is that is needed for OV from 10/2014 with Dr Lovena Le for clarification. I let her know we do not have any faxes from them for Dr Lovena Le to review.

## 2016-03-11 ENCOUNTER — Ambulatory Visit
Admission: RE | Admit: 2016-03-11 | Discharge: 2016-03-11 | Disposition: A | Discharge status: Home / Self Care | Payer: Medicare Other | Source: Ambulatory Visit | Attending: Internal Medicine | Admitting: Internal Medicine

## 2016-03-11 DIAGNOSIS — Z1231 Encounter for screening mammogram for malignant neoplasm of breast: Secondary | ICD-10-CM

## 2016-04-17 ENCOUNTER — Emergency Department (HOSPITAL_COMMUNITY)
Admission: EM | Admit: 2016-04-17 | Discharge: 2016-04-17 | Disposition: A | Discharge status: Home / Self Care | Payer: Medicare Other | Source: Home / Self Care | Attending: Emergency Medicine | Admitting: Emergency Medicine

## 2016-04-17 ENCOUNTER — Encounter (HOSPITAL_COMMUNITY): Payer: Self-pay | Admitting: *Deleted

## 2016-04-17 ENCOUNTER — Emergency Department (HOSPITAL_COMMUNITY): Payer: Medicare Other

## 2016-04-17 DIAGNOSIS — R110 Nausea: Secondary | ICD-10-CM | POA: Insufficient documentation

## 2016-04-17 DIAGNOSIS — R0789 Other chest pain: Secondary | ICD-10-CM

## 2016-04-17 DIAGNOSIS — I110 Hypertensive heart disease with heart failure: Secondary | ICD-10-CM

## 2016-04-17 DIAGNOSIS — I5042 Chronic combined systolic (congestive) and diastolic (congestive) heart failure: Secondary | ICD-10-CM | POA: Insufficient documentation

## 2016-04-17 DIAGNOSIS — Z96643 Presence of artificial hip joint, bilateral: Secondary | ICD-10-CM | POA: Insufficient documentation

## 2016-04-17 DIAGNOSIS — Z794 Long term (current) use of insulin: Secondary | ICD-10-CM | POA: Insufficient documentation

## 2016-04-17 DIAGNOSIS — Z79899 Other long term (current) drug therapy: Secondary | ICD-10-CM | POA: Insufficient documentation

## 2016-04-17 DIAGNOSIS — Z87891 Personal history of nicotine dependence: Secondary | ICD-10-CM

## 2016-04-17 DIAGNOSIS — R1114 Bilious vomiting: Secondary | ICD-10-CM | POA: Insufficient documentation

## 2016-04-17 DIAGNOSIS — E119 Type 2 diabetes mellitus without complications: Secondary | ICD-10-CM

## 2016-04-17 DIAGNOSIS — Z95810 Presence of automatic (implantable) cardiac defibrillator: Secondary | ICD-10-CM | POA: Insufficient documentation

## 2016-04-17 DIAGNOSIS — I2510 Atherosclerotic heart disease of native coronary artery without angina pectoris: Secondary | ICD-10-CM

## 2016-04-17 DIAGNOSIS — Z955 Presence of coronary angioplasty implant and graft: Secondary | ICD-10-CM | POA: Insufficient documentation

## 2016-04-17 DIAGNOSIS — Z96651 Presence of right artificial knee joint: Secondary | ICD-10-CM

## 2016-04-17 DIAGNOSIS — Z7902 Long term (current) use of antithrombotics/antiplatelets: Secondary | ICD-10-CM | POA: Insufficient documentation

## 2016-04-17 LAB — BASIC METABOLIC PANEL
Anion gap: 10 (ref 5–15)
BUN: 14 mg/dL (ref 6–20)
CO2: 26 mmol/L (ref 22–32)
Calcium: 9.4 mg/dL (ref 8.9–10.3)
Chloride: 97 mmol/L — ABNORMAL LOW (ref 101–111)
Creatinine, Ser: 1.02 mg/dL — ABNORMAL HIGH (ref 0.44–1.00)
GFR calc Af Amer: >60 mL/min (ref >60)
GFR calc non Af Amer: 57 mL/min — ABNORMAL LOW (ref >60)
Glucose, Bld: 449 mg/dL — ABNORMAL HIGH (ref 65–99)
Potassium: 4 mmol/L (ref 3.5–5.1)
Sodium: 133 mmol/L — ABNORMAL LOW (ref 135–145)

## 2016-04-17 LAB — CBG MONITORING, ED
Glucose-Capillary: 340 mg/dL — ABNORMAL HIGH (ref 65–99)
Glucose-Capillary: 392 mg/dL — ABNORMAL HIGH (ref 65–99)

## 2016-04-17 LAB — I-STAT TROPONIN, ED
Troponin i, poc: 0.02 ng/mL (ref 0.00–0.08)
Troponin i, poc: 0 ng/mL (ref 0.00–0.08)

## 2016-04-17 LAB — CBC
HCT: 38.1 % (ref 36.0–46.0)
Hemoglobin: 13.3 g/dL (ref 12.0–15.0)
MCH: 30.6 pg (ref 26.0–34.0)
MCHC: 34.9 g/dL (ref 30.0–36.0)
MCV: 87.6 fL (ref 78.0–100.0)
Platelets: 357 10*3/uL (ref 150–400)
RBC: 4.35 MIL/uL (ref 3.87–5.11)
RDW: 12.3 % (ref 11.5–15.5)
WBC: 16.3 10*3/uL — ABNORMAL HIGH (ref 4.0–10.5)

## 2016-04-17 MED ORDER — PROMETHAZINE HCL 25 MG PO TABS: 25.0000 mg | ORAL_TABLET | Freq: Three times a day (TID) | ORAL | 0 refills | Status: DC | PRN

## 2016-04-17 MED ORDER — TRAMADOL HCL 50 MG PO TABS: 50.0000 mg | ORAL_TABLET | Freq: Four times a day (QID) | ORAL | 0 refills | Status: DC | PRN

## 2016-04-17 NOTE — Discharge Instructions (Signed)
Return here as needed.  Follow-up with your primary care doctor °

## 2016-04-17 NOTE — ED Triage Notes (Signed)
Pt complains of mid-chest pain, RLQ abdominal pain, migraine, diarrhea and emesis since last night. Pt vomited last night. Pt states she 3-4 episodes of diarrhea last night.

## 2016-04-17 NOTE — ED Notes (Signed)
EDPA Provider at bedside. 

## 2016-04-17 NOTE — ED Provider Notes (Signed)
Pt seen and evaluated.  D/W c/ Lawyer PA-C.  Pt main c/o N/V/D. Minimal sx of dyspnea thia am, but not now. EKG, enzymes, and cxr without acute findings. Agree with Dc home and sx treatment for gastroenteritis symptoms.   Tanna Furry, MD 04/17/16 1900

## 2016-04-17 NOTE — ED Notes (Signed)
Urine collected and at bedside.

## 2016-04-17 NOTE — ED Notes (Signed)
Bed: WA11 Expected date:  Expected time:  Means of arrival:  Comments: Triage 2 

## 2016-04-17 NOTE — ED Provider Notes (Signed)
[facility] DEPT Provider Note   CSN: [id number] Arrival date & time: 04/17/16  1440     History   Chief Complaint Chief Complaint  Patient presents with  . Chest Pain  . Migraine  . Abdominal Pain    HPI [name] is a 66 y.o. female.  HPI  Patient presents to the emergency department with chest discomfort that started last night and has been constant since that time.  She states movement and palpation make the pain worse.  She states that she is also having some right lateral flank pain, states she has had some nausea and vomiting 1.  Patient states that she did not take any medications prior to arrival.  States nothing seems make the condition betterThe patient denies , shortness of breath, headache,blurred vision, neck pain, fever, cough, weakness, numbness, dizziness, anorexia, edema, abdominal pain, nausea, vomiting, diarrhea, rash, back pain, dysuria, hematemesis, bloody stool, near syncope, or syncope. Past Medical History:  Diagnosis Date  . Anemia    a. Noted on 07/2012 labs, instructed to f/u PCP.  Marland Kitchen Arthritis    "joints" (11/18/2012)  . Automatic implantable cardioverter-defibrillator in situ   . CAD (coronary artery disease), native coronary artery    a. Nonobstructive by cath 02/2012 (done because of low EF).  . Chronic bronchitis ([place])    "~ every other year" (11/18/2012)  . Chronic combined systolic and diastolic CHF (congestive heart failure) ([place])    a. 03/05/12 echo:  LVEF 20-25%, moderate LVH , inferior and basal to mid septal akinesis, anterior moderate to severe hypokinesis and grade 2 diastolic dysfunction. b. EF 07/2012: EF still 25% (unclear medication compliance).  . Chronic lower back pain   . Headache(784.0)    "often; maybe not daily" (11/18/2012)  . High cholesterol   . History of noncompliance with medical treatment   . Hypertension   . LBBB (left bundle branch block)   . Orthopnea   . Tobacco abuse   . Type II diabetes mellitus [facility])      Patient Active Problem List   Diagnosis Date Noted  . Dyspnea 07/19/2015  . Interstitial lung disease ([place]) 11/20/2014  . SIRS (systemic inflammatory response syndrome) ([place]) 02/03/2014  . DM (diabetes mellitus) ([place]) 02/03/2014  . Tachycardia 06/14/2013  . Chronic systolic CHF (congestive heart failure) ([place]) 09/29/2012  . Hypoxia 03/06/2012  . Hypertensive heart disease 03/06/2012  . Nonischemic cardiomyopathy ([place]) 03/06/2012  . Tobacco abuse 03/06/2012  . Type 2 diabetes mellitus ([place]) 03/06/2012  . Essential hypertension 03/06/2012  . CAD (coronary artery disease), native coronary artery 03/06/2012  . Acute on chronic combined systolic and diastolic heart failure ([place]) 03/06/2012  . Hypokalemia 03/06/2012  . Acute bronchitis 02/01/2009  . Sleep apnea 02/01/2009  . CHEST PAIN 02/01/2009    Past Surgical History:  Procedure Laterality Date  . BI-VENTRICULAR IMPLANTABLE CARDIOVERTER DEFIBRILLATOR N/A 11/18/2012   Procedure: BI-VENTRICULAR IMPLANTABLE CARDIOVERTER DEFIBRILLATOR  (CRT-D);  Surgeon: Evans Lance, MD;  Location: [facility] CATH LAB;  Service: Cardiovascular;  Laterality: N/A;  . BI-VENTRICULAR IMPLANTABLE CARDIOVERTER DEFIBRILLATOR  (CRT-D)  11/18/2012  . CARDIAC CATHETERIZATION  03/04/12   nonobstructive CAD, elevated LVEDP and tortuous vessels suggestive of long-standing hypertension  . JOINT REPLACEMENT     Bilateral hip and right knee  . LEFT HEART CATH N/A 03/05/2012   Procedure: LEFT HEART CATH;  Surgeon: Larey Dresser, MD;  Location: [facility] CATH LAB;  Service: Cardiovascular;  Laterality: N/A;    OB History    No  data available       Home Medications    Prior to Admission medications   Medication Sig Start Date End Date Taking? Authorizing Provider  acetaminophen (TYLENOL) 500 MG tablet Take 1 tablet (500 mg total) by mouth every 6 (six) hours as needed. Patient taking differently: Take 500 mg by mouth every 6 (six) hours as needed for moderate pain.   09/15/15  Yes Leo Grosser, MD  albuterol (PROVENTIL HFA;VENTOLIN HFA) 108 (90 BASE) MCG/ACT inhaler Inhale 2 puffs into the lungs every 6 (six) hours as needed for wheezing or shortness of breath.   Yes Historical Provider, MD  apixaban (ELIQUIS) 5 MG TABS tablet Take 1 tablet (5 mg total) by mouth 2 (two) times daily. 07/20/15  Yes Cheryln Manly, NP  atorvastatin (LIPITOR) 40 MG tablet Take 1 tablet (40 mg total) by mouth daily. 07/20/15  Yes Cheryln Manly, NP  carvedilol (COREG) 12.5 MG tablet Take 1 tablet (12.5 mg total) by mouth 2 (two) times daily with a meal. 07/20/15  Yes Cheryln Manly, NP  enalapril (VASOTEC) 10 MG tablet Take 1 tablet (10 mg total) by mouth daily. 07/20/15  Yes Cheryln Manly, NP  furosemide (LASIX) 40 MG tablet Take 1 tablet (40 mg total) by mouth 2 (two) times daily. 07/20/15  Yes Cheryln Manly, NP  Insulin Glargine (LANTUS SOLOSTAR) 100 UNIT/ML Solostar Pen Inject 50 Units into the skin at bedtime.   Yes Historical Provider, MD  isosorbide-hydrALAZINE (BIDIL) 20-37.5 MG tablet Take 2 tablets by mouth 3 (three) times daily. 07/20/15  Yes Cheryln Manly, NP  metFORMIN (GLUCOPHAGE) 1000 MG tablet Take 1,000 mg by mouth 2 (two) times daily with a meal.   Yes Historical Provider, MD  Oxycodone HCl 10 MG TABS Take 1-2 tablets by mouth 2 (two) times daily as needed (pain).  08/27/15  Yes Historical Provider, MD  potassium chloride SA (K-DUR,KLOR-CON) 20 MEQ tablet Take 1 tablet (20 mEq total) by mouth 2 (two) times daily. 07/20/15  Yes Cheryln Manly, NP  spironolactone (ALDACTONE) 25 MG tablet Take 1 tablet (25 mg total) by mouth daily. 07/20/15  Yes Cheryln Manly, NP  traMADol (ULTRAM) 50 MG tablet Take 1 tablet (50 mg total) by mouth every 6 (six) hours as needed. Patient taking differently: Take 50 mg by mouth every 6 (six) hours as needed for moderate pain.  10/13/15  Yes Nona Dell, PA-C    Family History Family History  Problem Relation  Age of Onset  . Heart disease Neg Hx     Social History Social History  Substance Use Topics  . Smoking status: Former Smoker    Packs/day: 1.00    Years: 40.00    Types: Cigarettes    Start date: 06/23/1972    Quit date: 03/26/2012  . Smokeless tobacco: Never Used  . Alcohol use No     Allergies   Morphine and related   Review of Systems Review of Systems All other systems negative except as documented in the HPI. All pertinent positives and negatives as reviewed in the HPI.  Physical Exam Updated Vital Signs BP (!) 161/108   Pulse 88   Temp 98.2 F (36.8 C) (Oral)   Resp 17   Ht 5\' 7"  (1.702 m)   Wt 99.3 kg   SpO2 97%   BMI 34.30 kg/m   Physical Exam  Constitutional: She is oriented to person, place, and time. She appears well-developed and well-nourished. No distress.  HENT:  Head: Normocephalic and atraumatic.  Mouth/Throat: Oropharynx is clear and moist.  Eyes: Pupils are equal, round, and reactive to light.  Neck: Normal range of motion. Neck supple.  Cardiovascular: Normal rate, regular rhythm and normal heart sounds.  Exam reveals no gallop and no friction rub.   No murmur heard. Pulmonary/Chest: Effort normal and breath sounds normal. No respiratory distress. She has no wheezes.  Abdominal: Soft. Bowel sounds are normal. She exhibits no distension. There is no tenderness.  Neurological: She is alert and oriented to person, place, and time. She exhibits normal muscle tone. Coordination normal.  Skin: Skin is warm and dry. Capillary refill takes less than 2 seconds. No rash noted. No erythema.  Psychiatric: She has a normal mood and affect. Her behavior is normal.  Nursing note and vitals reviewed.    ED Treatments / Results  Labs (all labs ordered are listed, but only abnormal results are displayed) Labs Reviewed  BASIC METABOLIC PANEL - Abnormal; Notable for the following:       Result Value   Sodium 133 (*)    Chloride 97 (*)    Glucose, Bld  449 (*)    Creatinine, Ser 1.02 (*)    GFR calc non Af Amer 57 (*)    All other components within normal limits  CBC - Abnormal; Notable for the following:    WBC 16.3 (*)    All other components within normal limits  CBG MONITORING, ED - Abnormal; Notable for the following:    Glucose-Capillary 340 (*)    All other components within normal limits  CBG MONITORING, ED - Abnormal; Notable for the following:    Glucose-Capillary 392 (*)    All other components within normal limits  I-STAT TROPOININ, ED  I-STAT TROPOININ, ED    EKG  EKG Interpretation  Date/Time:  Thursday April 17 2016 14:54:27 EDT Ventricular Rate:  110 PR Interval:    QRS Duration: 140 QT Interval:  384 QTC Calculation: 520 R Axis:   -104 Text Interpretation:  Atrial-sensed ventricular-paced rhythm No further analysis attempted due to paced rhythm Baseline wander in lead(s) III V2 Since last tracing now in paced rhythym Confirmed by Eulis Foster  MD, ELLIOTT [phone]) on 04/17/2016 3:54:36 PM       Radiology Dg Chest 2 View  Result Date: 04/17/2016 CLINICAL DATA:  LEFT-sided chest pain and shortness of breath since yesterday. EXAM: CHEST  2 VIEW COMPARISON:  10/13/2015. FINDINGS: Cardiomegaly. Multi lead AICD device with stable lead position, LEFT subclavian approach. No active infiltrates or failure. No effusion or pneumothorax. No osseous findings. IMPRESSION: Stable chest.  Cardiomegaly. Electronically Signed   By: Staci Righter M.D.   On: 04/17/2016 15:40    Procedures Procedures (including critical care time)  Medications Ordered in ED Medications - No data to display   Initial Impression / Assessment and Plan / ED Course  I have reviewed the triage vital signs and the nursing notes.  Pertinent labs & imaging results that were available during my care of the patient were reviewed by me and considered in my medical decision making (see chart for details).     The patient has had constant chest pain since  last night.  It seems to be worse with movement and palpation.  Patient, states she has had similar symptoms in the past.  Patient be asked to follow-up with her primary care Dr. told to return here as needed.  I feel that this is atypical chest pain, infected  has been constant and unrelenting.  The patient also states that movement and palpation make the pain worse  Final Clinical Impressions(s) / ED Diagnoses   Final diagnoses:  None    New Prescriptions New Prescriptions   No medications on file     Dalia Heading, PA-C 04/18/16 0145    Tanna Furry, MD 04/30/16 1220

## 2016-04-17 NOTE — ED Notes (Signed)
Patient transported to X-ray 

## 2016-04-20 ENCOUNTER — Emergency Department (HOSPITAL_COMMUNITY): Payer: Medicare Other

## 2016-04-20 ENCOUNTER — Encounter (HOSPITAL_COMMUNITY): Payer: Self-pay

## 2016-04-20 ENCOUNTER — Inpatient Hospital Stay (HOSPITAL_COMMUNITY)
Admission: EM | Admit: 2016-04-20 | Discharge: 2016-04-25 | DRG: 871 | Disposition: A | Discharge status: Home / Self Care | Payer: Medicare Other | Attending: Internal Medicine | Admitting: Internal Medicine

## 2016-04-20 ENCOUNTER — Inpatient Hospital Stay (HOSPITAL_COMMUNITY): Payer: Medicare Other

## 2016-04-20 DIAGNOSIS — E119 Type 2 diabetes mellitus without complications: Secondary | ICD-10-CM

## 2016-04-20 DIAGNOSIS — Z794 Long term (current) use of insulin: Secondary | ICD-10-CM

## 2016-04-20 DIAGNOSIS — M545 Low back pain: Secondary | ICD-10-CM | POA: Diagnosis present

## 2016-04-20 DIAGNOSIS — R6521 Severe sepsis with septic shock: Secondary | ICD-10-CM | POA: Diagnosis present

## 2016-04-20 DIAGNOSIS — Z95810 Presence of automatic (implantable) cardiac defibrillator: Secondary | ICD-10-CM

## 2016-04-20 DIAGNOSIS — D638 Anemia in other chronic diseases classified elsewhere: Secondary | ICD-10-CM | POA: Diagnosis present

## 2016-04-20 DIAGNOSIS — Z96643 Presence of artificial hip joint, bilateral: Secondary | ICD-10-CM | POA: Diagnosis present

## 2016-04-20 DIAGNOSIS — E7800 Pure hypercholesterolemia, unspecified: Secondary | ICD-10-CM | POA: Diagnosis present

## 2016-04-20 DIAGNOSIS — E669 Obesity, unspecified: Secondary | ICD-10-CM | POA: Diagnosis present

## 2016-04-20 DIAGNOSIS — R079 Chest pain, unspecified: Secondary | ICD-10-CM | POA: Diagnosis present

## 2016-04-20 DIAGNOSIS — I4891 Unspecified atrial fibrillation: Secondary | ICD-10-CM | POA: Diagnosis not present

## 2016-04-20 DIAGNOSIS — R778 Other specified abnormalities of plasma proteins: Secondary | ICD-10-CM | POA: Diagnosis present

## 2016-04-20 DIAGNOSIS — N390 Urinary tract infection, site not specified: Secondary | ICD-10-CM | POA: Diagnosis present

## 2016-04-20 DIAGNOSIS — E785 Hyperlipidemia, unspecified: Secondary | ICD-10-CM | POA: Diagnosis present

## 2016-04-20 DIAGNOSIS — A419 Sepsis, unspecified organism: Principal | ICD-10-CM

## 2016-04-20 DIAGNOSIS — M1990 Unspecified osteoarthritis, unspecified site: Secondary | ICD-10-CM | POA: Diagnosis present

## 2016-04-20 DIAGNOSIS — Z87891 Personal history of nicotine dependence: Secondary | ICD-10-CM

## 2016-04-20 DIAGNOSIS — E1165 Type 2 diabetes mellitus with hyperglycemia: Secondary | ICD-10-CM | POA: Diagnosis present

## 2016-04-20 DIAGNOSIS — S20319A Abrasion of unspecified front wall of thorax, initial encounter: Secondary | ICD-10-CM

## 2016-04-20 DIAGNOSIS — R0902 Hypoxemia: Secondary | ICD-10-CM | POA: Diagnosis present

## 2016-04-20 DIAGNOSIS — N179 Acute kidney failure, unspecified: Secondary | ICD-10-CM | POA: Diagnosis present

## 2016-04-20 DIAGNOSIS — I10 Essential (primary) hypertension: Secondary | ICD-10-CM

## 2016-04-20 DIAGNOSIS — I110 Hypertensive heart disease with heart failure: Secondary | ICD-10-CM | POA: Diagnosis present

## 2016-04-20 DIAGNOSIS — Z6834 Body mass index (BMI) 34.0-34.9, adult: Secondary | ICD-10-CM

## 2016-04-20 DIAGNOSIS — G8929 Other chronic pain: Secondary | ICD-10-CM | POA: Diagnosis present

## 2016-04-20 DIAGNOSIS — I480 Paroxysmal atrial fibrillation: Secondary | ICD-10-CM

## 2016-04-20 DIAGNOSIS — I5022 Chronic systolic (congestive) heart failure: Secondary | ICD-10-CM | POA: Diagnosis present

## 2016-04-20 DIAGNOSIS — I2510 Atherosclerotic heart disease of native coronary artery without angina pectoris: Secondary | ICD-10-CM | POA: Diagnosis present

## 2016-04-20 DIAGNOSIS — E872 Acidosis: Secondary | ICD-10-CM | POA: Diagnosis present

## 2016-04-20 DIAGNOSIS — Z885 Allergy status to narcotic agent status: Secondary | ICD-10-CM

## 2016-04-20 DIAGNOSIS — J449 Chronic obstructive pulmonary disease, unspecified: Secondary | ICD-10-CM | POA: Diagnosis present

## 2016-04-20 DIAGNOSIS — R911 Solitary pulmonary nodule: Secondary | ICD-10-CM

## 2016-04-20 DIAGNOSIS — I447 Left bundle-branch block, unspecified: Secondary | ICD-10-CM | POA: Diagnosis present

## 2016-04-20 DIAGNOSIS — Z96651 Presence of right artificial knee joint: Secondary | ICD-10-CM | POA: Diagnosis present

## 2016-04-20 DIAGNOSIS — Z9114 Patient's other noncompliance with medication regimen: Secondary | ICD-10-CM

## 2016-04-20 DIAGNOSIS — I428 Other cardiomyopathies: Secondary | ICD-10-CM | POA: Diagnosis present

## 2016-04-20 DIAGNOSIS — A4151 Sepsis due to Escherichia coli [E. coli]: Secondary | ICD-10-CM | POA: Diagnosis present

## 2016-04-20 DIAGNOSIS — R7881 Bacteremia: Secondary | ICD-10-CM | POA: Diagnosis not present

## 2016-04-20 DIAGNOSIS — Z79899 Other long term (current) drug therapy: Secondary | ICD-10-CM

## 2016-04-20 DIAGNOSIS — I5021 Acute systolic (congestive) heart failure: Secondary | ICD-10-CM | POA: Diagnosis not present

## 2016-04-20 DIAGNOSIS — E871 Hypo-osmolality and hyponatremia: Secondary | ICD-10-CM | POA: Diagnosis present

## 2016-04-20 DIAGNOSIS — R109 Unspecified abdominal pain: Secondary | ICD-10-CM

## 2016-04-20 DIAGNOSIS — N10 Acute tubulo-interstitial nephritis: Secondary | ICD-10-CM | POA: Diagnosis not present

## 2016-04-20 DIAGNOSIS — R402142 Coma scale, eyes open, spontaneous, at arrival to emergency department: Secondary | ICD-10-CM | POA: Diagnosis present

## 2016-04-20 DIAGNOSIS — R402252 Coma scale, best verbal response, oriented, at arrival to emergency department: Secondary | ICD-10-CM | POA: Diagnosis present

## 2016-04-20 DIAGNOSIS — I5042 Chronic combined systolic (congestive) and diastolic (congestive) heart failure: Secondary | ICD-10-CM | POA: Diagnosis present

## 2016-04-20 DIAGNOSIS — E876 Hypokalemia: Secondary | ICD-10-CM | POA: Diagnosis present

## 2016-04-20 DIAGNOSIS — R402362 Coma scale, best motor response, obeys commands, at arrival to emergency department: Secondary | ICD-10-CM | POA: Diagnosis present

## 2016-04-20 DIAGNOSIS — I5043 Acute on chronic combined systolic (congestive) and diastolic (congestive) heart failure: Secondary | ICD-10-CM | POA: Diagnosis present

## 2016-04-20 DIAGNOSIS — Z7901 Long term (current) use of anticoagulants: Secondary | ICD-10-CM

## 2016-04-20 DIAGNOSIS — IMO0001 Reserved for inherently not codable concepts without codable children: Secondary | ICD-10-CM

## 2016-04-20 LAB — URINALYSIS, ROUTINE W REFLEX MICROSCOPIC
Bilirubin Urine: NEGATIVE
Glucose, UA: >=500 mg/dL — AB
Ketones, ur: NEGATIVE mg/dL
Nitrite: NEGATIVE
Protein, ur: 30 mg/dL — AB
Specific Gravity, Urine: 1.018 (ref 1.005–1.030)
pH: 5 (ref 5.0–8.0)

## 2016-04-20 LAB — GLUCOSE, CAPILLARY
Glucose-Capillary: 490 mg/dL — ABNORMAL HIGH (ref 65–99)
Glucose-Capillary: 500 mg/dL — ABNORMAL HIGH (ref 65–99)
Glucose-Capillary: 427 mg/dL — ABNORMAL HIGH (ref 65–99)
Glucose-Capillary: 318 mg/dL — ABNORMAL HIGH (ref 65–99)

## 2016-04-20 LAB — CBC
HCT: 30.5 % — ABNORMAL LOW (ref 36.0–46.0)
Hemoglobin: 10.4 g/dL — ABNORMAL LOW (ref 12.0–15.0)
MCH: 30.2 pg (ref 26.0–34.0)
MCHC: 34.1 g/dL (ref 30.0–36.0)
MCV: 88.7 fL (ref 78.0–100.0)
Platelets: 366 10*3/uL (ref 150–400)
RBC: 3.44 MIL/uL — ABNORMAL LOW (ref 3.87–5.11)
RDW: 12.2 % (ref 11.5–15.5)
WBC: 22.9 10*3/uL — ABNORMAL HIGH (ref 4.0–10.5)

## 2016-04-20 LAB — COMPREHENSIVE METABOLIC PANEL
ALT: 14 U/L (ref 14–54)
AST: 21 U/L (ref 15–41)
Albumin: 3 g/dL — ABNORMAL LOW (ref 3.5–5.0)
Alkaline Phosphatase: 119 U/L (ref 38–126)
Anion gap: 14 (ref 5–15)
BUN: 20 mg/dL (ref 6–20)
CO2: 24 mmol/L (ref 22–32)
Calcium: 8.7 mg/dL — ABNORMAL LOW (ref 8.9–10.3)
Chloride: 88 mmol/L — ABNORMAL LOW (ref 101–111)
Creatinine, Ser: 1.42 mg/dL — ABNORMAL HIGH (ref 0.44–1.00)
GFR calc Af Amer: 45 mL/min — ABNORMAL LOW (ref >60)
GFR calc non Af Amer: 38 mL/min — ABNORMAL LOW (ref >60)
Glucose, Bld: 727 mg/dL (ref 65–99)
Potassium: 4.1 mmol/L (ref 3.5–5.1)
Sodium: 126 mmol/L — ABNORMAL LOW (ref 135–145)
Total Bilirubin: 0.5 mg/dL (ref 0.3–1.2)
Total Protein: 7.5 g/dL (ref 6.5–8.1)

## 2016-04-20 LAB — BASIC METABOLIC PANEL
Anion gap: 13 (ref 5–15)
BUN: 21 mg/dL — ABNORMAL HIGH (ref 6–20)
CO2: 25 mmol/L (ref 22–32)
Calcium: 8.2 mg/dL — ABNORMAL LOW (ref 8.9–10.3)
Chloride: 92 mmol/L — ABNORMAL LOW (ref 101–111)
Creatinine, Ser: 1.68 mg/dL — ABNORMAL HIGH (ref 0.44–1.00)
GFR calc Af Amer: 36 mL/min — ABNORMAL LOW (ref >60)
GFR calc non Af Amer: 31 mL/min — ABNORMAL LOW (ref >60)
Glucose, Bld: 526 mg/dL (ref 65–99)
Potassium: 3.3 mmol/L — ABNORMAL LOW (ref 3.5–5.1)
Sodium: 130 mmol/L — ABNORMAL LOW (ref 135–145)

## 2016-04-20 LAB — CBC WITH DIFFERENTIAL/PLATELET
Basophils Absolute: 0 10*3/uL (ref 0.0–0.1)
Basophils Relative: 0 %
Eosinophils Absolute: 0 10*3/uL (ref 0.0–0.7)
Eosinophils Relative: 0 %
HCT: 35.7 % — ABNORMAL LOW (ref 36.0–46.0)
Hemoglobin: 12.3 g/dL (ref 12.0–15.0)
Lymphocytes Relative: 4 %
Lymphs Abs: 0.9 10*3/uL (ref 0.7–4.0)
MCH: 31.1 pg (ref 26.0–34.0)
MCHC: 34.5 g/dL (ref 30.0–36.0)
MCV: 90.2 fL (ref 78.0–100.0)
Monocytes Absolute: 2.8 10*3/uL — ABNORMAL HIGH (ref 0.1–1.0)
Monocytes Relative: 11 %
Neutro Abs: 21.2 10*3/uL — ABNORMAL HIGH (ref 1.7–7.7)
Neutrophils Relative %: 85 %
Platelets: 364 10*3/uL (ref 150–400)
RBC: 3.96 MIL/uL (ref 3.87–5.11)
RDW: 12.4 % (ref 11.5–15.5)
WBC: 24.9 10*3/uL — ABNORMAL HIGH (ref 4.0–10.5)

## 2016-04-20 LAB — PROCALCITONIN: Procalcitonin: 46.1 ng/mL

## 2016-04-20 LAB — I-STAT CG4 LACTIC ACID, ED
Lactic Acid, Venous: 2.05 mmol/L (ref 0.5–1.9)
Lactic Acid, Venous: 3.1 mmol/L (ref 0.5–1.9)

## 2016-04-20 LAB — MRSA PCR SCREENING: MRSA by PCR: NEGATIVE

## 2016-04-20 LAB — PROTIME-INR
INR: 1.18
Prothrombin Time: 15.1 seconds (ref 11.4–15.2)

## 2016-04-20 LAB — LACTIC ACID, PLASMA
Lactic Acid, Venous: 3.1 mmol/L (ref 0.5–1.9)
Lactic Acid, Venous: 3.5 mmol/L (ref 0.5–1.9)
Lactic Acid, Venous: 2.6 mmol/L (ref 0.5–1.9)
Lactic Acid, Venous: 2.2 mmol/L (ref 0.5–1.9)

## 2016-04-20 LAB — TROPONIN I
Troponin I: 0.1 ng/mL (ref <0.03)
Troponin I: 0.12 ng/mL (ref <0.03)

## 2016-04-20 LAB — APTT: aPTT: 24 seconds (ref 24–36)

## 2016-04-20 LAB — CREATININE, SERUM
Creatinine, Ser: 1.47 mg/dL — ABNORMAL HIGH (ref 0.44–1.00)
GFR calc Af Amer: 43 mL/min — ABNORMAL LOW (ref >60)
GFR calc non Af Amer: 37 mL/min — ABNORMAL LOW (ref >60)

## 2016-04-20 LAB — BRAIN NATRIURETIC PEPTIDE: B Natriuretic Peptide: 199.5 pg/mL — ABNORMAL HIGH (ref 0.0–100.0)

## 2016-04-20 LAB — LIPASE, BLOOD: Lipase: <10 U/L — ABNORMAL LOW (ref 11–51)

## 2016-04-20 MED ORDER — SODIUM CHLORIDE 0.9 % IV BOLUS (SEPSIS)
500.0000 mL | Freq: Once | INTRAVENOUS | Status: AC
  Administered 2016-04-20: 500 mL via INTRAVENOUS

## 2016-04-20 MED ORDER — DEXTROSE 5 % IV SOLN
2.0000 g | Freq: Once | INTRAVENOUS | Status: AC
  Administered 2016-04-20: 2 g via INTRAVENOUS
  Filled 2016-04-20: qty 2

## 2016-04-20 MED ORDER — SODIUM CHLORIDE 0.9 % IV SOLN: INTRAVENOUS | Status: DC

## 2016-04-20 MED ORDER — LORAZEPAM 2 MG/ML IJ SOLN
0.5000 mg | Freq: Once | INTRAMUSCULAR | Status: AC
  Administered 2016-04-20: 0.5 mg via INTRAVENOUS
  Filled 2016-04-20: qty 1

## 2016-04-20 MED ORDER — CARVEDILOL 12.5 MG PO TABS
12.5000 mg | ORAL_TABLET | Freq: Two times a day (BID) | ORAL | Status: DC
  Administered 2016-04-20: 12.5 mg via ORAL
  Filled 2016-04-20: qty 1

## 2016-04-20 MED ORDER — ACETAMINOPHEN 325 MG PO TABS
650.0000 mg | ORAL_TABLET | Freq: Once | ORAL | Status: AC
  Administered 2016-04-20: 650 mg via ORAL
  Filled 2016-04-20: qty 2

## 2016-04-20 MED ORDER — SODIUM CHLORIDE 0.9 % IV BOLUS (SEPSIS): 1000.0000 mL | Freq: Once | INTRAVENOUS | Status: DC

## 2016-04-20 MED ORDER — IOPAMIDOL (ISOVUE-300) INJECTION 61%
INTRAVENOUS | Status: AC
  Administered 2016-04-20: 100 mL
  Filled 2016-04-20: qty 100

## 2016-04-20 MED ORDER — INSULIN GLARGINE 100 UNIT/ML ~~LOC~~ SOLN
50.0000 [IU] | Freq: Every day | SUBCUTANEOUS | Status: DC
  Filled 2016-04-20: qty 0.5

## 2016-04-20 MED ORDER — DEXTROSE 5 % IV SOLN
1.0000 g | INTRAVENOUS | Status: DC
  Administered 2016-04-20: 1 g via INTRAVENOUS
  Filled 2016-04-20 (×3): qty 1

## 2016-04-20 MED ORDER — SPIRONOLACTONE 25 MG PO TABS: 25.0000 mg | ORAL_TABLET | Freq: Every day | ORAL | Status: DC

## 2016-04-20 MED ORDER — ATORVASTATIN CALCIUM 40 MG PO TABS
40.0000 mg | ORAL_TABLET | Freq: Every day | ORAL | Status: DC
  Administered 2016-04-21 – 2016-04-25 (×5): 40 mg via ORAL
  Filled 2016-04-20 (×5): qty 1

## 2016-04-20 MED ORDER — INSULIN ASPART 100 UNIT/ML ~~LOC~~ SOLN
10.0000 [IU] | Freq: Once | SUBCUTANEOUS | Status: AC
  Administered 2016-04-20: 10 [IU] via SUBCUTANEOUS
  Filled 2016-04-20: qty 1

## 2016-04-20 MED ORDER — SODIUM CHLORIDE 0.9 % IV SOLN
INTRAVENOUS | Status: DC
  Administered 2016-04-20: 1000 mL via INTRAVENOUS
  Administered 2016-04-20: 23:00:00 via INTRAVENOUS

## 2016-04-20 MED ORDER — ENALAPRIL MALEATE 2.5 MG PO TABS: 2.5000 mg | ORAL_TABLET | Freq: Every day | ORAL | Status: DC

## 2016-04-20 MED ORDER — ALBUTEROL SULFATE (2.5 MG/3ML) 0.083% IN NEBU: 3.0000 mL | INHALATION_SOLUTION | Freq: Four times a day (QID) | RESPIRATORY_TRACT | Status: DC | PRN

## 2016-04-20 MED ORDER — VANCOMYCIN HCL IN DEXTROSE 750-5 MG/150ML-% IV SOLN
750.0000 mg | Freq: Two times a day (BID) | INTRAVENOUS | Status: DC
Start: 2016-04-20 — End: 2016-04-21
  Administered 2016-04-20 – 2016-04-21 (×2): 750 mg via INTRAVENOUS
  Filled 2016-04-20 (×3): qty 150

## 2016-04-20 MED ORDER — HYDROMORPHONE HCL 1 MG/ML IJ SOLN
0.5000 mg | Freq: Once | INTRAMUSCULAR | Status: AC
  Administered 2016-04-20: 0.5 mg via INTRAVENOUS
  Filled 2016-04-20: qty 1

## 2016-04-20 MED ORDER — TRAMADOL HCL 50 MG PO TABS
50.0000 mg | ORAL_TABLET | Freq: Four times a day (QID) | ORAL | Status: DC | PRN
  Administered 2016-04-20 – 2016-04-22 (×3): 50 mg via ORAL
  Filled 2016-04-20 (×4): qty 1

## 2016-04-20 MED ORDER — POTASSIUM CHLORIDE CRYS ER 20 MEQ PO TBCR: 20.0000 meq | EXTENDED_RELEASE_TABLET | Freq: Two times a day (BID) | ORAL | Status: DC

## 2016-04-20 MED ORDER — IOPAMIDOL (ISOVUE-370) INJECTION 76%
INTRAVENOUS | Status: AC
  Administered 2016-04-20: 80 mL
  Filled 2016-04-20: qty 100

## 2016-04-20 MED ORDER — ONDANSETRON HCL 4 MG PO TABS: 4.0000 mg | ORAL_TABLET | Freq: Four times a day (QID) | ORAL | Status: DC | PRN

## 2016-04-20 MED ORDER — ONDANSETRON HCL 4 MG/2ML IJ SOLN: 4.0000 mg | Freq: Four times a day (QID) | INTRAMUSCULAR | Status: DC | PRN

## 2016-04-20 MED ORDER — PROMETHAZINE HCL 25 MG PO TABS: 25.0000 mg | ORAL_TABLET | Freq: Three times a day (TID) | ORAL | Status: DC | PRN

## 2016-04-20 MED ORDER — DOCUSATE SODIUM 100 MG PO CAPS
100.0000 mg | ORAL_CAPSULE | Freq: Two times a day (BID) | ORAL | Status: DC
  Filled 2016-04-20: qty 1

## 2016-04-20 MED ORDER — POLYETHYLENE GLYCOL 3350 17 G PO PACK
17.0000 g | PACK | Freq: Every day | ORAL | Status: DC
  Administered 2016-04-23 – 2016-04-24 (×2): 17 g via ORAL
  Filled 2016-04-20 (×5): qty 1

## 2016-04-20 MED ORDER — INSULIN ASPART 100 UNIT/ML ~~LOC~~ SOLN
0.0000 [IU] | Freq: Three times a day (TID) | SUBCUTANEOUS | Status: DC
  Administered 2016-04-20: 17 [IU] via SUBCUTANEOUS
  Administered 2016-04-21 (×2): 15 [IU] via SUBCUTANEOUS

## 2016-04-20 MED ORDER — ISOSORB DINITRATE-HYDRALAZINE 20-37.5 MG PO TABS
2.0000 | ORAL_TABLET | Freq: Three times a day (TID) | ORAL | Status: DC
  Administered 2016-04-20: 2 via ORAL
  Filled 2016-04-20 (×2): qty 2

## 2016-04-20 MED ORDER — DEXTROSE 5 % IV SOLN: 1.0000 g | INTRAVENOUS | Status: DC

## 2016-04-20 MED ORDER — ACETAMINOPHEN 325 MG PO TABS
650.0000 mg | ORAL_TABLET | Freq: Four times a day (QID) | ORAL | Status: DC | PRN
  Administered 2016-04-20 – 2016-04-22 (×2): 650 mg via ORAL
  Filled 2016-04-20 (×2): qty 2

## 2016-04-20 MED ORDER — SODIUM CHLORIDE 0.9 % IV BOLUS (SEPSIS)
500.0000 mL | Freq: Once | INTRAVENOUS | Status: AC
Start: 2016-04-20 — End: 2016-04-20
  Administered 2016-04-20: 500 mL via INTRAVENOUS

## 2016-04-20 MED ORDER — HEPARIN SODIUM (PORCINE) 5000 UNIT/ML IJ SOLN
5000.0000 [IU] | Freq: Three times a day (TID) | INTRAMUSCULAR | Status: DC
  Administered 2016-04-20 – 2016-04-23 (×9): 5000 [IU] via SUBCUTANEOUS
  Filled 2016-04-20 (×10): qty 1

## 2016-04-20 MED ORDER — FUROSEMIDE 20 MG PO TABS
40.0000 mg | ORAL_TABLET | Freq: Two times a day (BID) | ORAL | Status: DC
  Filled 2016-04-20: qty 2

## 2016-04-20 MED ORDER — ACETAMINOPHEN 650 MG RE SUPP: 650.0000 mg | Freq: Four times a day (QID) | RECTAL | Status: DC | PRN

## 2016-04-20 MED ORDER — ENALAPRIL MALEATE 10 MG PO TABS
10.0000 mg | ORAL_TABLET | Freq: Every day | ORAL | Status: DC
  Administered 2016-04-20: 10 mg via ORAL
  Filled 2016-04-20: qty 1

## 2016-04-20 MED ORDER — ONDANSETRON HCL 4 MG/2ML IJ SOLN
4.0000 mg | Freq: Once | INTRAMUSCULAR | Status: AC
  Administered 2016-04-20: 4 mg via INTRAVENOUS
  Filled 2016-04-20: qty 2

## 2016-04-20 MED ORDER — SODIUM CHLORIDE 0.9 % IV BOLUS (SEPSIS): 500.0000 mL | Freq: Once | INTRAVENOUS | Status: DC

## 2016-04-20 NOTE — Progress Notes (Signed)
BP 68/49 Becoming confused call to Dr Gerald Dexter   Fluid bolus ordered of 500 cc NS- Lactic acid 3.5 CBG 490 DR wanted 17 u insulin given started Fluid bolus right away. Pt in bed c/o being hot- very diaphoretic BP at [place] 106

## 2016-04-20 NOTE — Progress Notes (Signed)
CRITICAL VALUE ALERT  Critical value received:  Lactic acid 2.2  Date of notification:  04/20/16  Time of notification:  11:01 PM  Critical value read back:Yes.    Nurse who received alert:  Mickle Plumb  MD notified (1st page):  elink  Time of first page:  11:01 PM

## 2016-04-20 NOTE — ED Triage Notes (Signed)
Pt c/o chest pain that radiates to her back since Tuesday. Pt endorse n/v and SOB. Pt a&ox4.

## 2016-04-20 NOTE — ED Notes (Signed)
I stat lactic results given to Dr. Zenia Resides by B. Yolanda Bonine, EMT

## 2016-04-20 NOTE — Progress Notes (Signed)
Called report to 2 M spoke to Rachael  RN BP 80/46 SR 96

## 2016-04-20 NOTE — ED Provider Notes (Signed)
[facility] DEPT Provider Note   CSN: [id number] Arrival date & time: 04/20/16  0900     History   Chief Complaint Chief Complaint  Patient presents with  . Chest Pain  . Abdominal Pain    HPI [name] is a 66 y.o. female.  66 year old female presents with several-day history of right upper right lower quadrant abdominal pain that radiates to her chest and to her flank.. Seen in outside facility several days ago for similar symptoms and that record was reviewed and workup at that time was felt to be due to chest wall pain. Since that time patient is complaining of having nonbilious vomiting without fever or chills. Some cough but no dyspnea. She is unsure if she has had dysuria but denies any gross hematuria. No dark bloody stools. No vaginal bleeding or discharge. Symptoms persistent and unrelieved with oral analgesics. Denies any history of cholecystectomy or appendectomy.      Past Medical History:  Diagnosis Date  . Anemia    a. Noted on 07/2012 labs, instructed to f/u PCP.  Marland Kitchen Arthritis    "joints" (11/18/2012)  . Automatic implantable cardioverter-defibrillator in situ   . CAD (coronary artery disease), native coronary artery    a. Nonobstructive by cath 02/2012 (done because of low EF).  . Chronic bronchitis ([place])    "~ every other year" (11/18/2012)  . Chronic combined systolic and diastolic CHF (congestive heart failure) ([place])    a. 03/05/12 echo:  LVEF 20-25%, moderate LVH , inferior and basal to mid septal akinesis, anterior moderate to severe hypokinesis and grade 2 diastolic dysfunction. b. EF 07/2012: EF still 25% (unclear medication compliance).  . Chronic lower back pain   . Headache(784.0)    "often; maybe not daily" (11/18/2012)  . High cholesterol   . History of noncompliance with medical treatment   . Hypertension   . LBBB (left bundle branch block)   . Orthopnea   . Tobacco abuse   . Type II diabetes mellitus [facility])     Patient Active Problem  List   Diagnosis Date Noted  . Dyspnea 07/19/2015  . Interstitial lung disease ([place]) 11/20/2014  . SIRS (systemic inflammatory response syndrome) ([place]) 02/03/2014  . DM (diabetes mellitus) ([place]) 02/03/2014  . Tachycardia 06/14/2013  . Chronic systolic CHF (congestive heart failure) ([place]) 09/29/2012  . Hypoxia 03/06/2012  . Hypertensive heart disease 03/06/2012  . Nonischemic cardiomyopathy ([place]) 03/06/2012  . Tobacco abuse 03/06/2012  . Type 2 diabetes mellitus ([place]) 03/06/2012  . Essential hypertension 03/06/2012  . CAD (coronary artery disease), native coronary artery 03/06/2012  . Acute on chronic combined systolic and diastolic heart failure ([place]) 03/06/2012  . Hypokalemia 03/06/2012  . Acute bronchitis 02/01/2009  . Sleep apnea 02/01/2009  . CHEST PAIN 02/01/2009    Past Surgical History:  Procedure Laterality Date  . BI-VENTRICULAR IMPLANTABLE CARDIOVERTER DEFIBRILLATOR N/A 11/18/2012   Procedure: BI-VENTRICULAR IMPLANTABLE CARDIOVERTER DEFIBRILLATOR  (CRT-D);  Surgeon: Evans Lance, MD;  Location: [facility] CATH LAB;  Service: Cardiovascular;  Laterality: N/A;  . BI-VENTRICULAR IMPLANTABLE CARDIOVERTER DEFIBRILLATOR  (CRT-D)  11/18/2012  . CARDIAC CATHETERIZATION  03/04/12   nonobstructive CAD, elevated LVEDP and tortuous vessels suggestive of long-standing hypertension  . JOINT REPLACEMENT     Bilateral hip and right knee  . LEFT HEART CATH N/A 03/05/2012   Procedure: LEFT HEART CATH;  Surgeon: Larey Dresser, MD;  Location: [facility] CATH LAB;  Service: Cardiovascular;  Laterality: N/A;    OB History  No data available       Home Medications    Prior to Admission medications   Medication Sig Start Date End Date Taking? Authorizing Provider  acetaminophen (TYLENOL) 500 MG tablet Take 1 tablet (500 mg total) by mouth every 6 (six) hours as needed. Patient taking differently: Take 500 mg by mouth every 6 (six) hours as needed for moderate pain.  09/15/15   Leo Grosser, MD    albuterol (PROVENTIL HFA;VENTOLIN HFA) 108 (90 BASE) MCG/ACT inhaler Inhale 2 puffs into the lungs every 6 (six) hours as needed for wheezing or shortness of breath.    Historical Provider, MD  apixaban (ELIQUIS) 5 MG TABS tablet Take 1 tablet (5 mg total) by mouth 2 (two) times daily. 07/20/15   Cheryln Manly, NP  atorvastatin (LIPITOR) 40 MG tablet Take 1 tablet (40 mg total) by mouth daily. 07/20/15   Cheryln Manly, NP  carvedilol (COREG) 12.5 MG tablet Take 1 tablet (12.5 mg total) by mouth 2 (two) times daily with a meal. 07/20/15   Cheryln Manly, NP  enalapril (VASOTEC) 10 MG tablet Take 1 tablet (10 mg total) by mouth daily. 07/20/15   Cheryln Manly, NP  furosemide (LASIX) 40 MG tablet Take 1 tablet (40 mg total) by mouth 2 (two) times daily. 07/20/15   Cheryln Manly, NP  Insulin Glargine (LANTUS SOLOSTAR) 100 UNIT/ML Solostar Pen Inject 50 Units into the skin at bedtime.    Historical Provider, MD  isosorbide-hydrALAZINE (BIDIL) 20-37.5 MG tablet Take 2 tablets by mouth 3 (three) times daily. 07/20/15   Cheryln Manly, NP  metFORMIN (GLUCOPHAGE) 1000 MG tablet Take 1,000 mg by mouth 2 (two) times daily with a meal.    Historical Provider, MD  Oxycodone HCl 10 MG TABS Take 1-2 tablets by mouth 2 (two) times daily as needed (pain).  08/27/15   Historical Provider, MD  potassium chloride SA (K-DUR,KLOR-CON) 20 MEQ tablet Take 1 tablet (20 mEq total) by mouth 2 (two) times daily. 07/20/15   Cheryln Manly, NP  promethazine (PHENERGAN) 25 MG tablet Take 1 tablet (25 mg total) by mouth every 8 (eight) hours as needed for nausea or vomiting. 04/17/16   Dalia Heading, PA-C  spironolactone (ALDACTONE) 25 MG tablet Take 1 tablet (25 mg total) by mouth daily. 07/20/15   Cheryln Manly, NP  traMADol (ULTRAM) 50 MG tablet Take 1 tablet (50 mg total) by mouth every 6 (six) hours as needed for severe pain. 04/17/16   Dalia Heading, PA-C    Family History Family History   Problem Relation Age of Onset  . Heart disease Neg Hx     Social History Social History  Substance Use Topics  . Smoking status: Former Smoker    Packs/day: 1.00    Years: 40.00    Types: Cigarettes    Start date: 06/23/1972    Quit date: 03/26/2012  . Smokeless tobacco: Never Used  . Alcohol use No     Allergies   Morphine and related   Review of Systems Review of Systems  All other systems reviewed and are negative.    Physical Exam Updated Vital Signs BP 128/64 (BP Location: Left Arm)   Pulse (!) 115   Temp 99.4 F (37.4 C) (Oral)   Resp 20   Ht 5\' 7"  (1.702 m)   Wt 99.3 kg   SpO2 97%   BMI 34.30 kg/m   Physical Exam  Constitutional: She is oriented to person, place,  and time. She appears well-developed and well-nourished.  Non-toxic appearance. No distress.  HENT:  Head: Normocephalic and atraumatic.  Eyes: Conjunctivae, EOM and lids are normal. Pupils are equal, round, and reactive to light.  Neck: Normal range of motion. Neck supple. No tracheal deviation present. No thyroid mass present.  Cardiovascular: Normal rate, regular rhythm and normal heart sounds.  Exam reveals no gallop.   No murmur heard. Pulmonary/Chest: Effort normal and breath sounds normal. No stridor. No respiratory distress. She has no decreased breath sounds. She has no wheezes. She has no rhonchi. She has no rales.  Abdominal: Soft. Normal appearance and bowel sounds are normal. She exhibits no distension. There is no tenderness. There is no rebound and no CVA tenderness.  Musculoskeletal: Normal range of motion. She exhibits no edema or tenderness.  Neurological: She is alert and oriented to person, place, and time. She has normal strength. No cranial nerve deficit or sensory deficit. GCS eye subscore is 4. GCS verbal subscore is 5. GCS motor subscore is 6.  Skin: Skin is warm and dry. No abrasion and no rash noted.  Psychiatric: She has a normal mood and affect. Her speech is normal  and behavior is normal.  Nursing note and vitals reviewed.    ED Treatments / Results  Labs (all labs ordered are listed, but only abnormal results are displayed) Labs Reviewed  URINE CULTURE  CBC WITH DIFFERENTIAL/PLATELET  COMPREHENSIVE METABOLIC PANEL  TROPONIN I  LIPASE, BLOOD  URINALYSIS, ROUTINE W REFLEX MICROSCOPIC    EKG  EKG Interpretation  Date/Time:  Sunday April 20 2016 09:09:33 EDT Ventricular Rate:  115 PR Interval:    QRS Duration: 142 QT Interval:  375 QTC Calculation: 519 R Axis:   -126 Text Interpretation:  Ventricular-paced rhythm No further analysis attempted due to paced rhythm No significant change since last tracing Confirmed by Saxton Chain  MD, Martino Tompson ([id number]) on 04/20/2016 9:11:51 AM       Radiology No results found.  Procedures Procedures (including critical care time)  Medications Ordered in ED Medications  LORazepam (ATIVAN) injection 0.5 mg (not administered)  HYDROmorphone (DILAUDID) injection 0.5 mg (not administered)  ondansetron (ZOFRAN) injection 4 mg (not administered)     Initial Impression / Assessment and Plan / ED Course  I have reviewed the triage vital signs and the nursing notes.  Pertinent labs & imaging results that were available during my care of the patient were reviewed by me and considered in my medical decision making (see chart for details).   patient noted to be febrile here. Patient to be given Tylenol for her fever. Has leukocytosis on her CBC. Also has evidence of hyperglycemia which was treated with IV fluids as well as insulin. Chest x-ray without infiltrate but urinalysis does show infection. Lactate and blood cultures obtained. Started on IV antibiotics  Will consult medicine service for admission  CRITICAL CARE Performed by: Leota Jacobsen Total critical care time: 50 minutes Critical care time was exclusive of separately billable procedures and treating other patients. Critical care was necessary to  treat or prevent imminent or life-threatening deterioration. Critical care was time spent personally by me on the following activities: development of treatment plan with patient and/or surrogate as well as nursing, discussions with consultants, evaluation of patient's response to treatment, examination of patient, obtaining history from patient or surrogate, ordering and performing treatments and interventions, ordering and review of laboratory studies, ordering and review of radiographic studies, pulse oximetry and re-evaluation of patient's condition.  Final Clinical Impressions(s) / ED Diagnoses   Final diagnoses:  Chest pain    New Prescriptions New Prescriptions   No medications on file     Lacretia Leigh, MD 04/20/16 1143

## 2016-04-20 NOTE — Progress Notes (Signed)
[facility] Progress Note Patient Name: [name] DOB: April 27, 1950 MRN: [id number]   Date of Service  04/20/2016  HPI/Events of Note  bp still 57 mean systolic p ns x 383 cc   Intake/Output Summary (Last 24 hours) at 04/20/16 2000 Last data filed at 04/20/16 1645  Gross per 24 hour  Intake              550 ml  Output              200 ml  Net              350 ml     eICU Interventions  Repeat fluid bolus      Intervention Category Major Interventions: Sepsis - evaluation and management  Christinia Gully 04/20/2016, 7:59 PM

## 2016-04-20 NOTE — Progress Notes (Signed)
[facility] Progress Note Patient Name: [name] DOB: November 03, 1950 MRN: [id number]   Date of Service  04/20/2016  HPI/Events of Note  Pt with sepsis and very high PCT only source is pyuria with ct chest and abd unimpressive  eICU Interventions  D/c coreg/ vasotec Transfer to ICU CCM to see asap      Intervention Category Major Interventions: Sepsis - evaluation and management  Christinia Gully 04/20/2016, 6:52 PM

## 2016-04-20 NOTE — Progress Notes (Signed)
Pharmacy Antibiotic Note  [name] is a 66 y.o. female admitted on 04/20/2016 with sepsis.  Pharmacy has been consulted for Vancomycin / Cefepime dosing.  Plan: Cefepime 1 gram iv Q 24 hours Vancomycin 750 mg iv Q 12 hours  Height: 5\' 7"  (170.2 cm) Weight: 219 lb (99.3 kg) IBW/kg (Calculated) : 61.6  Temp (24hrs), Avg:101.7 F (38.7 C), Min:99.4 F (37.4 C), Max:103.2 F (39.6 C)   Recent Labs Lab 04/17/16 1515 04/20/16 0928 04/20/16 1047 04/20/16 1425 04/20/16 1441 04/20/16 1633  WBC 16.3* 24.9*  --  22.9*  --   --   CREATININE 1.02* 1.42*  --  1.47*  --   --   LATICACIDVEN  --   --  2.05* 3.1* 3.10* 3.5*    Estimated Creatinine Clearance: 47.4 mL/min (A) (by C-G formula based on SCr of 1.47 mg/dL (H)).    Allergies  Allergen Reactions  . Morphine And Related Nausea And Vomiting and Other (See Comments)    Severe nausea     Thank you  Anette Guarneri, PharmD [phone]  04/20/2016 6:21 PM

## 2016-04-20 NOTE — Progress Notes (Signed)
Transferred to ICU 2 M bed 1 per bed with all belongings glasses and cell phone and purse. Family aware

## 2016-04-20 NOTE — ED Notes (Signed)
Report attempted x3.  Will transport patient upstairs and give bedside report.  RNs upstairs aware of this plan.  All belongings taken with the patient upstairs.

## 2016-04-20 NOTE — Progress Notes (Signed)
CRITICAL VALUE ALERT  Critical value received:  Glucose 520 lactic acid 2.6  Date of notification:  04/20/16  Time of notification:  8:07 PM  Critical value read back:Yes.    Nurse who received alert:  Leia Alf RN  MD notified (1st page):  ELINK  Time of first page:  8:07 PM

## 2016-04-20 NOTE — Progress Notes (Signed)
At 1810 gave 17 u insulin SQ for CBG of 490 now CBG is greater than 500 call to MD more than 1/2 way through 500 CC ns bolus Vanc infusing - BP 72/56

## 2016-04-20 NOTE — ED Notes (Signed)
Report attempted x2

## 2016-04-20 NOTE — ED Notes (Signed)
Report attempted x 1

## 2016-04-20 NOTE — H&P (Signed)
History and Physical    [name] UQJ:[id number] DOB: 09-Apr-1950 DOA: 04/20/2016  PCP: [name], MD   Patient coming from: Home   Chief Complaint: Chest pain, shortness of breath, abdominal pain, flank pain and nausea, vomiting  HPI: [name] is a 66 y.o. 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  After visit patient started having right-sided abdominal pain radiating to the right flank and to the upper right side of the back; later she developed nausea and nonbilious vomiting but denied hematuria or dysuria and denied seeing blood in the stool or having vaginal bleeding Patient stated that she was becoming weaker and more nauseous and having more pain so she decided to come to the emergency room for evaluation  ED Course:  In the emergency department she was found to be febrile with temperature 103.2, tachycardic cardiac with heart rate 119 and tachypnea with respirations of 29. She maintained stable blood pressure that initially was hypoxic with oxygen saturation of 86. Blood work demonstrated leukocytosis-24,900  (white blood cells count was 16,300 on 322 red [facility]), hyponatremia with sodium 126, she was hypoglycemic with glucose of 727 elevated creatinine of 1.42 BNP was slightly elevated at 199 point as well as troponin elevated to 0.10  Lactic acid was elevated to 2 .05 Chest x-ray showed mild cardiomegaly without pulmonary edema and questionable right upper lobe pulmonary nodule-further evaluation with CT of  the chest was recommended. Abdominal CT without signs of acute abnormality, uterus enlargement since stable compared to the MRI images in 2014 Urinalysis revealed cloudy urine with large amount of protein and greater than 500 glucose, too numerous to count white blood cells in clumps and negative nitrates with few bacteria  Review of Systems: As per HPI otherwise 10 point review of systems negative.   Ambulatory Status: independent  Past Medical History:  Diagnosis Date  . Anemia    a. Noted on 07/2012 labs, instructed to f/u PCP.  Marland Kitchen Arthritis    "joints" (11/18/2012)  . Automatic implantable cardioverter-defibrillator in situ   . CAD (coronary artery disease), native coronary artery    a. Nonobstructive by cath 02/2012 (done because of low EF).  . Chronic bronchitis ([place])    "~ every other year" (11/18/2012)  . Chronic combined systolic and diastolic CHF (congestive heart failure) ([place])    a. 03/05/12 echo:  LVEF 20-25%, moderate LVH , inferior and basal to mid septal akinesis, anterior moderate to severe hypokinesis and grade 2 diastolic dysfunction. b. EF 07/2012: EF still 25% (unclear medication compliance).  . Chronic lower back pain   . Headache(784.0)    "often; maybe not daily" (11/18/2012)  . High cholesterol   . History of noncompliance with medical treatment   . Hypertension   . LBBB (left bundle branch block)   . Orthopnea   . Tobacco abuse   . Type II diabetes mellitus ([place])     Past Surgical History:  Procedure Laterality Date  . BI-VENTRICULAR IMPLANTABLE CARDIOVERTER DEFIBRILLATOR N/A 11/18/2012   Procedure: BI-VENTRICULAR IMPLANTABLE CARDIOVERTER DEFIBRILLATOR  (CRT-D);  Surgeon: Evans Lance, MD;  Location: [facility]  CATH LAB;  Service: Cardiovascular;  Laterality: N/A;  . BI-VENTRICULAR IMPLANTABLE CARDIOVERTER DEFIBRILLATOR  (CRT-D)  11/18/2012  . CARDIAC CATHETERIZATION  03/04/12   nonobstructive CAD, elevated LVEDP and tortuous vessels suggestive of long-standing  hypertension  . JOINT REPLACEMENT     Bilateral hip and right knee  . LEFT HEART CATH N/A 03/05/2012   Procedure: LEFT HEART CATH;  Surgeon: Larey Dresser, MD;  Location: [facility] CATH LAB;  Service: Cardiovascular;  Laterality: N/A;    Social History   Social History  . Marital status: Single    Spouse name: N/A  . Number of children: N/A  . Years of education: N/A   Occupational History  . Not on file.   Social History Main Topics  . Smoking status: Former Smoker    Packs/day: 1.00    Years: 40.00    Types: Cigarettes    Start date: 06/23/1972    Quit date: 03/26/2012  . Smokeless tobacco: Never Used  . Alcohol use No  . Drug use: No  . Sexual activity: Yes   Other Topics Concern  . Not on file   Social History Narrative  . No narrative on file    Allergies  Allergen Reactions  . Morphine And Related Nausea And Vomiting and Other (See Comments)    Severe nausea    Family History  Problem Relation Age of Onset  . Heart disease Neg Hx     Prior to Admission medications   Medication Sig Start Date End Date Taking? Authorizing Provider  acetaminophen (TYLENOL) 500 MG tablet Take 1 tablet (500 mg total) by mouth every 6 (six) hours as needed. Patient taking differently: Take 500 mg by mouth every 6 (six) hours as needed for moderate pain.  09/15/15  Yes Leo Grosser, MD  albuterol (PROVENTIL HFA;VENTOLIN HFA) 108 (90 BASE) MCG/ACT inhaler Inhale 2 puffs into the lungs every 6 (six) hours as needed for wheezing or shortness of breath.   Yes Historical Provider, MD  atorvastatin (LIPITOR) 40 MG tablet Take 1 tablet (40 mg total) by mouth daily. 07/20/15  Yes Cheryln Manly, NP  carvedilol (COREG) 12.5 MG tablet Take 1 tablet (12.5 mg total) by mouth 2 (two) times daily with a meal. 07/20/15  Yes Cheryln Manly, NP  enalapril (VASOTEC) 10 MG tablet Take 1 tablet (10 mg total) by mouth daily. 07/20/15  Yes Cheryln Manly, NP  furosemide (LASIX) 40 MG tablet Take 1 tablet  (40 mg total) by mouth 2 (two) times daily. 07/20/15  Yes Cheryln Manly, NP  isosorbide-hydrALAZINE (BIDIL) 20-37.5 MG tablet Take 2 tablets by mouth 3 (three) times daily. 07/20/15  Yes Cheryln Manly, NP  metFORMIN (GLUCOPHAGE) 1000 MG tablet Take 1,000 mg by mouth 2 (two) times daily with a meal.   Yes Historical Provider, MD  potassium chloride SA (K-DUR,KLOR-CON) 20 MEQ tablet Take 1 tablet (20 mEq total) by mouth 2 (two) times daily. 07/20/15  Yes Cheryln Manly, NP  spironolactone (ALDACTONE) 25 MG tablet Take 1 tablet (25 mg total) by mouth daily. 07/20/15  Yes Cheryln Manly, NP  apixaban (ELIQUIS) 5 MG TABS tablet Take 1 tablet (5 mg total) by mouth 2 (two) times daily. Patient not taking: Reported on 04/20/2016 07/20/15   Cheryln Manly, NP  Insulin Glargine (LANTUS SOLOSTAR) 100 UNIT/ML Solostar Pen Inject 50 Units into the skin at bedtime.    Historical Provider, MD  Oxycodone HCl 10 MG TABS Take 1-2 tablets by mouth  2 (two) times daily as needed (pain).  08/27/15   Historical Provider, MD  promethazine (PHENERGAN) 25 MG tablet Take 1 tablet (25 mg total) by mouth every 8 (eight) hours as needed for nausea or vomiting. Patient not taking: Reported on 04/20/2016 04/17/16   Dalia Heading, PA-C  traMADol (ULTRAM) 50 MG tablet Take 1 tablet (50 mg total) by mouth every 6 (six) hours as needed for severe pain. Patient not taking: Reported on 04/20/2016 04/17/16   Dalia Heading, PA-C    Physical Exam: Vitals:   04/20/16 1200 04/20/16 1215 04/20/16 1230 04/20/16 1255  BP: 135/71 111/68 102/88   Pulse: (!) 107 (!) 108 (!) 118   Resp: (!) 21 (!) 22 18   Temp:    (!) 103.1 F (39.5 C)  TempSrc:    Rectal  SpO2: 98% 98% 100%   Weight:      Height:         General: Appears calm and comfortable Eyes: PERRLA, EOMI, normal lids, iris ENT:  grossly normal hearing, lips & tongue, mucous membranes moist and intact Neck: no lymphoadenopathy, masses or  thyromegaly Cardiovascular: RRR, no m/r/g. No JVD, carotid bruits. No LE edema.  Respiratory: bilateral no wheezes, rales, rhonchi or cracles. Normal respiratory effort. No accessory muscle use observed Abdomen: soft, diffusely tender, more in the right lower quadrant, non-distended, no organomegaly or masses appreciated. BS present in all quadrants Skin: no rash, ulcers or induration seen on limited exam Musculoskeletal: grossly normal tone BUE/BLE, good ROM, no bony abnormality or joint deformities observed Psychiatric: grossly normal mood and affect, speech fluent and appropriate, alert and oriented x3 Neurologic: CN II-XII grossly intact, moves all extremities in coordinated fashion, sensation intact  Labs on Admission: I have personally reviewed following labs and imaging studies  CBC, BMP  GFR: Estimated Creatinine Clearance: 49.1 mL/min (A) (by C-G formula based on SCr of 1.42 mg/dL (H)).   Creatinine Clearance: Estimated Creatinine Clearance: 49.1 mL/min (A) (by C-G formula based on SCr of 1.42 mg/dL (H)).    Radiological Exams on Admission: Dg Chest 2 View  Result Date: 04/20/2016 CLINICAL DATA:  Pt c/o centralized chest pain that radiates to her back since Tuesday. Pt endorse n/v and SOB. Hx of defibrillator, coronary artery disease, chronic bronchitis, chronic combined systolic and diastolic CHF, hypertension. EXAM: CHEST  2 VIEW COMPARISON:  04/17/2016 FINDINGS: Heart is mildly enlarged. No pulmonary edema. patient's left-sided transvenous pacemaker with leads to the right atrium, right ventricle, and coronary sinus. Overlying the right lung apex, there is question of a pulmonary nodule warranting further evaluation. IMPRESSION: 1. Mild cardiomegaly without pulmonary edema. 2. Question of right upper lobe pulmonary nodule. Further evaluation with CT of the chest is recommended. Electronically Signed   By: Nolon Nations M.D.   On: 04/20/2016 10:18   Ct Abdomen Pelvis W  Contrast  Result Date: 04/20/2016 CLINICAL DATA:  Pt c/o LLQ pain and N/V for the past 4 days Isovue 300 37ml pt is on metformin instructed to discontinue for 48 hrs ^168mL ISOVUE-300 IOPAMIDOL (ISOVUE-300) INJECTION 61% EXAM: CT ABDOMEN AND PELVIS WITH CONTRAST TECHNIQUE: Multidetector CT imaging of the abdomen and pelvis was performed using the standard protocol following bolus administration of intravenous contrast. CONTRAST:  146mL ISOVUE-300 IOPAMIDOL (ISOVUE-300) INJECTION 61% COMPARISON:  07/05/2012 MRI pelvis, 01/26/2012 CT the abdomen and pelvis FINDINGS: Lower chest: Pacemaker leads in the coronary sinus and right ventricle. Heart size is normal. Lung bases clear. Hepatobiliary: No focal liver abnormality is seen. No  gallstones, gallbladder wall thickening, or biliary dilatation. Pancreas: Unremarkable. No pancreatic ductal dilatation or surrounding inflammatory changes. Spleen: Normal in size without focal abnormality. Adrenals/Urinary Tract: Adrenal glands are normal. There is symmetric enhancement of the kidneys. No renal mass or evidence for urinary tract obstruction. The distal ureters and bladder are partially obscured by a metallic artifact in the femurs. Stomach/Bowel: Stomach and small bowel loops are normal in appearance. Colon is normal in appearance. No evidence for acute appendicitis. Average stool burden. Vascular/Lymphatic: No significant vascular findings are present. No enlarged abdominal or pelvic lymph nodes. Reproductive: The uterus is enlarged.  No adnexal mass. Other: None Musculoskeletal: Bilateral total hip arthroplasty. No acute fracture. IMPRESSION: 1.  No evidence for acute  abnormality. 2. Evaluation of the pelvis degraded by metallic artifact in bilateral total hip arthroplasty. 3. Enlargement of the uterus, similar in appearance to previous MRI in 2014. 4. Transvenous pacemaker. Electronically Signed   By: Nolon Nations M.D.   On: 04/20/2016 11:22    EKG: Independently  reviewed - EKG demonstrated ventricular paced rhythm Assessment/Plan Principal Problem:   Sepsis ([place]) Active Problems:   Chest pain   Hypertension   CAD (coronary artery disease), native coronary artery   Chronic combined systolic and diastolic CHF (congestive heart failure) ([facility])   DM (diabetes mellitus) ([place])   UTI (urinary tract infection)   Sepsis most likely associated with UTI Urine culture is pending We will give another small bolus 500 mL of normal saline IV and continue infusion at 125 mL an hour given patient's history of CHF  Monitor lactic acid-it was elevated to 20.5 Continue IV on antibiotic and adjust the choices when culture result is available if needed  Chest pain with elevated troponin and mild elevation of BNP in patient with known chronic combined systolic and diastolic CHF Chest Xray negative for FVO and showed a new pulmonary nodule Will order chest CT, continue cycle troponin and observe on telemetry  DM type II - presented with hyperglycemia of >700 She was given 10 units o Novolog and glucose reduced to 400's Continue Lantus, add SSI, carb modified diet  Hypertension - currently stable Continue home medication and adjust the doses if needed depending on the BP readings   Hyperlipidemia Continue Lipitor  DVT prophylaxis: heparin Code Status: full Family Communication: at bedside Disposition Plan: SDU Consults called: none Admission status: inpatient   York Grice, [place] Pager: [phone] [facility]  If 7PM-7AM, please contact night-coverage [email or web address] Password [email or web address]  04/20/2016, 1:08 PM

## 2016-04-20 NOTE — Progress Notes (Signed)
Pt arrived from ED to bed CHG bath and MRSA swab done

## 2016-04-20 NOTE — ED Notes (Signed)
Patient transported to X-ray 

## 2016-04-21 ENCOUNTER — Inpatient Hospital Stay (HOSPITAL_COMMUNITY): Payer: Medicare Other

## 2016-04-21 DIAGNOSIS — A4151 Sepsis due to Escherichia coli [E. coli]: Secondary | ICD-10-CM

## 2016-04-21 DIAGNOSIS — R079 Chest pain, unspecified: Secondary | ICD-10-CM

## 2016-04-21 DIAGNOSIS — N179 Acute kidney failure, unspecified: Secondary | ICD-10-CM

## 2016-04-21 DIAGNOSIS — I5042 Chronic combined systolic (congestive) and diastolic (congestive) heart failure: Secondary | ICD-10-CM

## 2016-04-21 DIAGNOSIS — N10 Acute tubulo-interstitial nephritis: Secondary | ICD-10-CM

## 2016-04-21 LAB — BLOOD CULTURE ID PANEL (REFLEXED)

## 2016-04-21 LAB — GLUCOSE, CAPILLARY
Glucose-Capillary: 334 mg/dL — ABNORMAL HIGH (ref 65–99)
Glucose-Capillary: 370 mg/dL — ABNORMAL HIGH (ref 65–99)
Glucose-Capillary: 400 mg/dL — ABNORMAL HIGH (ref 65–99)
Glucose-Capillary: 261 mg/dL — ABNORMAL HIGH (ref 65–99)
Glucose-Capillary: 203 mg/dL — ABNORMAL HIGH (ref 65–99)

## 2016-04-21 LAB — ECHOCARDIOGRAM COMPLETE
Height: 67 in
Weight: 3328 oz

## 2016-04-21 LAB — BASIC METABOLIC PANEL
Anion gap: 12 (ref 5–15)
BUN: 26 mg/dL — ABNORMAL HIGH (ref 6–20)
CO2: 25 mmol/L (ref 22–32)
Calcium: 8 mg/dL — ABNORMAL LOW (ref 8.9–10.3)
Chloride: 98 mmol/L — ABNORMAL LOW (ref 101–111)
Creatinine, Ser: 2.09 mg/dL — ABNORMAL HIGH (ref 0.44–1.00)
GFR calc Af Amer: 28 mL/min — ABNORMAL LOW (ref >60)
GFR calc non Af Amer: 24 mL/min — ABNORMAL LOW (ref >60)
Glucose, Bld: 361 mg/dL — ABNORMAL HIGH (ref 65–99)
Potassium: 3.9 mmol/L (ref 3.5–5.1)
Sodium: 135 mmol/L (ref 135–145)

## 2016-04-21 LAB — CBC
HCT: 32 % — ABNORMAL LOW (ref 36.0–46.0)
Hemoglobin: 11.1 g/dL — ABNORMAL LOW (ref 12.0–15.0)
MCH: 31.1 pg (ref 26.0–34.0)
MCHC: 34.7 g/dL (ref 30.0–36.0)
MCV: 89.6 fL (ref 78.0–100.0)
Platelets: 299 10*3/uL (ref 150–400)
RBC: 3.57 MIL/uL — ABNORMAL LOW (ref 3.87–5.11)
RDW: 12.5 % (ref 11.5–15.5)
WBC: 26.6 10*3/uL — ABNORMAL HIGH (ref 4.0–10.5)

## 2016-04-21 LAB — HIV ANTIBODY (ROUTINE TESTING W REFLEX): HIV Screen 4th Generation wRfx: NONREACTIVE

## 2016-04-21 MED ORDER — INSULIN ASPART 100 UNIT/ML ~~LOC~~ SOLN
0.0000 [IU] | Freq: Every day | SUBCUTANEOUS | Status: DC
  Administered 2016-04-21 – 2016-04-23 (×2): 2 [IU] via SUBCUTANEOUS

## 2016-04-21 MED ORDER — ORAL CARE MOUTH RINSE
15.0000 mL | Freq: Two times a day (BID) | OROMUCOSAL | Status: DC
  Administered 2016-04-21 – 2016-04-24 (×5): 15 mL via OROMUCOSAL

## 2016-04-21 MED ORDER — INSULIN GLARGINE 100 UNIT/ML ~~LOC~~ SOLN
28.0000 [IU] | Freq: Every day | SUBCUTANEOUS | Status: DC
  Administered 2016-04-21: 28 [IU] via SUBCUTANEOUS
  Filled 2016-04-21 (×2): qty 0.28

## 2016-04-21 MED ORDER — INSULIN ASPART 100 UNIT/ML ~~LOC~~ SOLN
8.0000 [IU] | Freq: Three times a day (TID) | SUBCUTANEOUS | Status: DC
  Administered 2016-04-21 – 2016-04-24 (×5): 8 [IU] via SUBCUTANEOUS

## 2016-04-21 MED ORDER — INSULIN GLARGINE 100 UNIT/ML ~~LOC~~ SOLN
16.0000 [IU] | Freq: Every day | SUBCUTANEOUS | Status: DC
  Filled 2016-04-21: qty 0.16

## 2016-04-21 MED ORDER — INSULIN ASPART 100 UNIT/ML ~~LOC~~ SOLN: 2.0000 [IU] | Freq: Three times a day (TID) | SUBCUTANEOUS | Status: DC

## 2016-04-21 MED ORDER — INSULIN ASPART 100 UNIT/ML ~~LOC~~ SOLN
0.0000 [IU] | Freq: Three times a day (TID) | SUBCUTANEOUS | Status: DC
  Administered 2016-04-21: 11 [IU] via SUBCUTANEOUS
  Administered 2016-04-22: 4 [IU] via SUBCUTANEOUS
  Administered 2016-04-22: 15 [IU] via SUBCUTANEOUS
  Administered 2016-04-22: 20 [IU] via SUBCUTANEOUS
  Administered 2016-04-23 (×2): 11 [IU] via SUBCUTANEOUS
  Administered 2016-04-23: 15 [IU] via SUBCUTANEOUS
  Administered 2016-04-24: 7 [IU] via SUBCUTANEOUS
  Administered 2016-04-24: 11 [IU] via SUBCUTANEOUS
  Administered 2016-04-24: 20 [IU] via SUBCUTANEOUS
  Administered 2016-04-25: 4 [IU] via SUBCUTANEOUS
  Administered 2016-04-25: 20 [IU] via SUBCUTANEOUS

## 2016-04-21 NOTE — Consult Note (Signed)
CARDIOLOGY CONSULT NOTE   Patient ID: CHYENNE SOBCZAK MRN: [id number] DOB/AGE: 1950/10/28 66 y.o.  Admit date: 04/20/2016  Requesting Physician: Dr. Elsworth Soho Primary Physician:   Colleen Hey, MD Primary Cardiologist: Dr. Aundra Dubin / [name] (EP) Reason for Consultation:  Decreased EF  HPI: [name] is a 66 y.o. female with a history of NICM with LBBB s/p MDT CRT-D (10/2012), HTN, DMT2, HLD, COPD on exertional O2, restrictive lung disease by PFTs, PAF on Eliquis, and medical non compliance who presented to [facility] on 04/20/16 with CP/SOB/abdominal pain with N/V. Found to have urosepsis and Ecoli bacteremia. Cardiology consulted with worsening LVEF and mildly elevated troponin.   She was admitted with CHF exacerbation in 02/2012.  EF 20-25% on echo, LHC with nonobstructive CAD. SPEP negative, UPEP negative, HIV negative. She was started on cardiac meds and discharged.  In 07/2012, she was admitted again with CHF exacerbation. She had run out of Lasix. She was diuresed and discharged. She had a chronic LBBB, and Medtronic CRT-D device was placed in 10/14.  She was admitted in 6/16 with hypertensive emergency and CHF exacerbation. She had been out of her medications for some time.  She was very volume overloaded and was diuresed. Cardiac meds restarted.  Echo showed EF 40-45%, mild LVH, septal and inferior hypokinesis.  Also of note, she had PFTs in 9/16 showing restrictive spirometry with low lung volume and DLCO.  She was supposed to get a high resolution CT to evaluate for interstitial lung disease but never had the study.  She was admitted in 6/17 with chest pain.  She had been out of Lasix for a week and was actually thought to be volume overloaded.  She was ruled out for MI. She was diuresed with IV Lasix.   She was last seen by Dr. Aundra Dubin in the advanced CHF clinic in 07/2015. She was doing well at that time. High resolution CT was ordered by never competed. Repeat 2D ECHO did show  EF improved to 45-50% with G2DD.   She was in her usual state of health until this past weekend. She presented to [facility] yesterday with CP, SOB, abdominal pain with N/V.   In ER found to have temp 103.9, met SIRS criteria, wbc ct 24900, UA was c/w UTI. While awaiting admission BP dropped to 68/49, lactic acid was 3.5. Antihypertensives were held. She was given IVFs & ABX administered.  Lactic acid cleared to 2.6. Both blood and urine are growing GNRs. Found to have urosepsis with Ecoli bacteremia. 2D ECHO showed global hypokinesis and reduction from 45-50% now down to 30-35%.    Currently feeling much better. Continues to get NS at 125/hr. Denies dyspnea Only complaining of abdomina pain currently and having to urinate. No CP or SOB. No LE edema, orthopnea or PND. No dizziness or syncope. No blood in stool or urine. No palpitations. She says she has run out of some of her medications but not sure which ones. Says she has been out for probably about 6 months. She has continued to take the medications that she has. She thinks she had run out of lasix.   Currently   Past Medical History:  Diagnosis Date  . Anemia    a. Noted on 07/2012 labs, instructed to f/u PCP.  Marland Kitchen Arthritis    "joints" (11/18/2012)  . Automatic implantable cardioverter-defibrillator in situ   . CAD (coronary artery disease), native coronary artery    a. Nonobstructive by cath 02/2012 (  done because of low EF).  . Chronic bronchitis ([place])    "~ every other year" (11/18/2012)  . Chronic combined systolic and diastolic CHF (congestive heart failure) ([place])    a. 03/05/12 echo:  LVEF 20-25%, moderate LVH , inferior and basal to mid septal akinesis, anterior moderate to severe hypokinesis and grade 2 diastolic dysfunction. b. EF 07/2012: EF still 25% (unclear medication compliance).  . Chronic lower back pain   . Headache(784.0)    "often; maybe not daily" (11/18/2012)  . High cholesterol   . History of noncompliance with medical  treatment   . Hypertension   . LBBB (left bundle branch block)   . Orthopnea   . Tobacco abuse   . Type II diabetes mellitus ([place])      Past Surgical History:  Procedure Laterality Date  . BI-VENTRICULAR IMPLANTABLE CARDIOVERTER DEFIBRILLATOR N/A 11/18/2012   Procedure: BI-VENTRICULAR IMPLANTABLE CARDIOVERTER DEFIBRILLATOR  (CRT-D);  Surgeon: Evans Lance, MD;  Location: [facility] CATH LAB;  Service: Cardiovascular;  Laterality: N/A;  . BI-VENTRICULAR IMPLANTABLE CARDIOVERTER DEFIBRILLATOR  (CRT-D)  11/18/2012  . CARDIAC CATHETERIZATION  03/04/12   nonobstructive CAD, elevated LVEDP and tortuous vessels suggestive of long-standing hypertension  . JOINT REPLACEMENT     Bilateral hip and right knee  . LEFT HEART CATH N/A 03/05/2012   Procedure: LEFT HEART CATH;  Surgeon: Larey Dresser, MD;  Location: [facility] CATH LAB;  Service: Cardiovascular;  Laterality: N/A;    Allergies  Allergen Reactions  . Morphine And Related Nausea And Vomiting and Other (See Comments)    Severe nausea    I have reviewed the patient's current medications . atorvastatin  40 mg Oral Daily  . ceFEPime (MAXIPIME) IV  1 g Intravenous Q24H  . heparin  5,000 Units Subcutaneous Q8H  . insulin aspart  0-20 Units Subcutaneous TID WC  . insulin aspart  0-5 Units Subcutaneous QHS  . insulin aspart  8 Units Subcutaneous TID WC  . insulin glargine  28 Units Subcutaneous Daily  . mouth rinse  15 mL Mouth Rinse BID  . polyethylene glycol  17 g Oral Daily  . sodium chloride  500 mL Intravenous Once   . sodium chloride 125 mL/hr at 04/21/16 0745   acetaminophen **OR** acetaminophen, albuterol, ondansetron **OR** ondansetron (ZOFRAN) IV, promethazine, traMADol  Prior to Admission medications   Medication Sig Start Date End Date Taking? Authorizing Provider  acetaminophen (TYLENOL) 500 MG tablet Take 1 tablet (500 mg total) by mouth every 6 (six) hours as needed. Patient taking differently: Take 500 mg by mouth every 6 (six)  hours as needed for moderate pain.  09/15/15  Yes Leo Grosser, MD  albuterol (PROVENTIL HFA;VENTOLIN HFA) 108 (90 BASE) MCG/ACT inhaler Inhale 2 puffs into the lungs every 6 (six) hours as needed for wheezing or shortness of breath.   Yes Historical Provider, MD  atorvastatin (LIPITOR) 40 MG tablet Take 1 tablet (40 mg total) by mouth daily. 07/20/15  Yes Cheryln Manly, NP  carvedilol (COREG) 12.5 MG tablet Take 1 tablet (12.5 mg total) by mouth 2 (two) times daily with a meal. 07/20/15  Yes Cheryln Manly, NP  enalapril (VASOTEC) 10 MG tablet Take 1 tablet (10 mg total) by mouth daily. 07/20/15  Yes Cheryln Manly, NP  furosemide (LASIX) 40 MG tablet Take 1 tablet (40 mg total) by mouth 2 (two) times daily. 07/20/15  Yes Cheryln Manly, NP  isosorbide-hydrALAZINE (BIDIL) 20-37.5 MG tablet Take 2 tablets by mouth 3 (three)  times daily. 07/20/15  Yes Cheryln Manly, NP  metFORMIN (GLUCOPHAGE) 1000 MG tablet Take 1,000 mg by mouth 2 (two) times daily with a meal.   Yes Historical Provider, MD  potassium chloride SA (K-DUR,KLOR-CON) 20 MEQ tablet Take 1 tablet (20 mEq total) by mouth 2 (two) times daily. 07/20/15  Yes Cheryln Manly, NP  spironolactone (ALDACTONE) 25 MG tablet Take 1 tablet (25 mg total) by mouth daily. 07/20/15  Yes Cheryln Manly, NP  apixaban (ELIQUIS) 5 MG TABS tablet Take 1 tablet (5 mg total) by mouth 2 (two) times daily. Patient not taking: Reported on 04/20/2016 07/20/15   Cheryln Manly, NP  Insulin Glargine (LANTUS SOLOSTAR) 100 UNIT/ML Solostar Pen Inject 50 Units into the skin at bedtime.    Historical Provider, MD  Oxycodone HCl 10 MG TABS Take 1-2 tablets by mouth 2 (two) times daily as needed (pain).  08/27/15   Historical Provider, MD  promethazine (PHENERGAN) 25 MG tablet Take 1 tablet (25 mg total) by mouth every 8 (eight) hours as needed for nausea or vomiting. Patient not taking: Reported on 04/20/2016 04/17/16   Dalia Heading, PA-C  traMADol  (ULTRAM) 50 MG tablet Take 1 tablet (50 mg total) by mouth every 6 (six) hours as needed for severe pain. Patient not taking: Reported on 04/20/2016 04/17/16   Dalia Heading, PA-C     Social History   Social History  . Marital status: Single    Spouse name: N/A  . Number of children: N/A  . Years of education: N/A   Occupational History  . Not on file.   Social History Main Topics  . Smoking status: Former Smoker    Packs/day: 1.00    Years: 40.00    Types: Cigarettes    Start date: 06/23/1972    Quit date: 03/26/2012  . Smokeless tobacco: Never Used  . Alcohol use No  . Drug use: No  . Sexual activity: Yes   Other Topics Concern  . Not on file   Social History Narrative  . No narrative on file    No family status information on file.   Family History  Problem Relation Age of Onset  . Heart disease Neg Hx     ROS:  Full 14 point review of systems complete and found to be negative unless listed above.  Physical Exam: Blood pressure (!) 109/46, pulse 79, temperature 98.7 F (37.1 C), temperature source Oral, resp. rate 14, height _0  (1.702 m), weight 208 lb (94.3 kg), SpO2 98 %.  General: Well developed, well nourished, female in no acute distress, obese. Lying in bed Head: Eyes PERRLA, No xanthomas.   Normocephalic and atraumatic, oropharynx without edema or exudate.  Lungs: CTAB no wheeze Heart: HRRR S1 S2, no rub/gallop, Heart regular rhythm, tachy with S1, S2 no  murmur. pulses are 2+ extrem.  No s3 Neck: No carotid bruits. No lymphadenopathy. JVP hard to see. I think it is 8-9 Abdomen: Obese Bowel sounds present, abdomen soft. Mildly tender in epigastrum without masses or hernias noted. Msk:  No spine or cva tenderness. No weakness, no joint deformities or effusions. Extremities: No clubbing or cyanosis.  Trace edema. Warm Neuro: Alert and oriented X 3. No focal deficits noted. Psych:  Good affect, responds appropriately Skin: No rashes or lesions  noted.  Labs:   Lab Results  Component Value Date   WBC 26.6 (H) 04/21/2016   HGB 11.1 (L) 04/21/2016   HCT 32.0 (L) 04/21/2016  MCV 89.6 04/21/2016   PLT 299 04/21/2016    Recent Labs  04/20/16 1425  INR 1.18    Recent Labs Lab 04/20/16 0928  04/21/16 0207  NA 126*  < > 135  K 4.1  < > 3.9  CL 88*  < > 98*  CO2 24  < > 25  BUN 20  < > 26*  CREATININE 1.42*  < > 2.09*  CALCIUM 8.7*  < > 8.0*  PROT 7.5  --   --   BILITOT 0.5  --   --   ALKPHOS 119  --   --   ALT 14  --   --   AST 21  --   --   GLUCOSE 727*  < > 361*  ALBUMIN 3.0*  --   --   < > = values in this interval not displayed. Magnesium  Date Value Ref Range Status  07/14/2014 1.9 1.7 - 2.4 mg/dL Final    Recent Labs  04/20/16 0928 04/20/16 1425  TROPONINI 0.10* 0.12*   No results for input(s): TROPIPOC in the last 72 hours. Pro B Natriuretic peptide (BNP)  Date/Time Value Ref Range Status  01/05/2014 02:04 PM 818.5 (H) 0 - 125 pg/mL Final  12/29/2013 02:22 PM 812.3 (H) 0 - 125 pg/mL Final   Lab Results  Component Value Date   CHOL 123 08/11/2012   HDL 40 08/11/2012   LDLCALC 66 08/11/2012   TRIG 84 08/11/2012    Lipase  Date/Time Value Ref Range Status  04/20/2016 09:28 AM <10 (L) 11 - 51 U/L Final    No results found for: VITAMINB12, FOLATE, FERRITIN, TIBC, IRON, RETICCTPCT  Echo: 04/21/2016 LV EF: 30% -   35% Study Conclusions - Left ventricle: The cavity size was normal. There was moderate   concentric hypertrophy. Systolic function was moderately to   severely reduced. The estimated ejection fraction was in the   range of 30% to 35%. Global hypokinesis worse in the   anteroseptal, inferior, and inferoseptal myocardium. Doppler   parameters are consistent with abnormal left ventricular   relaxation (grade 1 diastolic dysfunction). Doppler parameters   are consistent with high ventricular filling pressure. - Aortic valve: Transvalvular velocity was within the normal range.    There was no stenosis. There was no regurgitation. - Mitral valve: Transvalvular velocity was within the normal range.   There was no evidence for stenosis. There was no regurgitation. - Right ventricle: The cavity size was normal. Wall thickness was   normal. Systolic function was normal. - Right atrium: The atrium was mildly dilated. - Tricuspid valve: There was mild regurgitation. - Pulmonary arteries: Systolic pressure was within the normal   range. PA peak pressure: 26 mm Hg (S). - Pericardium, extracardiac: A small pericardial effusion was   identified. - Global longitudinal strain -12.5%, which is abnormal.   ECG:  V paced, HR 115 - personally reviewed  TELE: sinus tach with LBBB and PVCs - personally reviewed  Radiology:  Dg Chest 2 View  Result Date: 04/20/2016 CLINICAL DATA:  Pt c/o centralized chest pain that radiates to her back since Tuesday. Pt endorse n/v and SOB. Hx of defibrillator, coronary artery disease, chronic bronchitis, chronic combined systolic and diastolic CHF, hypertension. EXAM: CHEST  2 VIEW COMPARISON:  04/17/2016 FINDINGS: Heart is mildly enlarged. No pulmonary edema. patient's left-sided transvenous pacemaker with leads to the right atrium, right ventricle, and coronary sinus. Overlying the right lung apex, there is question of a pulmonary  nodule warranting further evaluation. IMPRESSION: 1. Mild cardiomegaly without pulmonary edema. 2. Question of right upper lobe pulmonary nodule. Further evaluation with CT of the chest is recommended. Electronically Signed   By: Nolon Nations M.D.   On: 04/20/2016 10:18   Ct Angio Chest Pe W Or Wo Contrast  Result Date: 04/20/2016 CLINICAL DATA:  Patient with chest pain and abdominal pain. Nausea and vomiting. Possible pulmonary nodule on prior chest radiograph. EXAM: CT ANGIOGRAPHY CHEST WITH CONTRAST TECHNIQUE: Multidetector CT imaging of the chest was performed using the standard protocol during bolus administration  of intravenous contrast. Multiplanar CT image reconstructions and MIPs were obtained to evaluate the vascular anatomy. CONTRAST:  80 cc Isovue 370 COMPARISON:  Chest radiograph earlier same day FINDINGS: Cardiovascular: No abnormal filling defect identified within the pulmonary arterial system to suggest pulmonary embolus. Evaluation is somewhat limited due to body habitus and motion artifact. Heart is enlarged. No pericardial effusion. Aorta and main pulmonary artery normal in caliber. Mediastinum/Nodes: No enlarged axillary, mediastinal or hilar lymphadenopathy. The esophagus is normal in appearance. Lungs/Pleura: Central airways are patent. Depending ground-glass opacities within the lower lobes bilaterally most compatible with atelectasis. No large area of pulmonary consolidation. No pleural effusion or pneumothorax. Upper Abdomen: Unremarkable. Musculoskeletal: Mid thoracic spine degenerative changes. Review of the MIP images confirms the above findings. IMPRESSION: No evidence for pulmonary embolus. Dependent atelectasis. Electronically Signed   By: Lovey Newcomer M.D.   On: 04/20/2016 14:31   Ct Abdomen Pelvis W Contrast  Result Date: 04/20/2016 CLINICAL DATA:  Pt c/o LLQ pain and N/V for the past 4 days Isovue 300 40m pt is on metformin instructed to discontinue for 48 hrs ^1039mISOVUE-300 IOPAMIDOL (ISOVUE-300) INJECTION 61% EXAM: CT ABDOMEN AND PELVIS WITH CONTRAST TECHNIQUE: Multidetector CT imaging of the abdomen and pelvis was performed using the standard protocol following bolus administration of intravenous contrast. CONTRAST:  10043mSOVUE-300 IOPAMIDOL (ISOVUE-300) INJECTION 61% COMPARISON:  07/05/2012 MRI pelvis, 01/26/2012 CT the abdomen and pelvis FINDINGS: Lower chest: Pacemaker leads in the coronary sinus and right ventricle. Heart size is normal. Lung bases clear. Hepatobiliary: No focal liver abnormality is seen. No gallstones, gallbladder wall thickening, or biliary dilatation. Pancreas:  Unremarkable. No pancreatic ductal dilatation or surrounding inflammatory changes. Spleen: Normal in size without focal abnormality. Adrenals/Urinary Tract: Adrenal glands are normal. There is symmetric enhancement of the kidneys. No renal mass or evidence for urinary tract obstruction. The distal ureters and bladder are partially obscured by a metallic artifact in the femurs. Stomach/Bowel: Stomach and small bowel loops are normal in appearance. Colon is normal in appearance. No evidence for acute appendicitis. Average stool burden. Vascular/Lymphatic: No significant vascular findings are present. No enlarged abdominal or pelvic lymph nodes. Reproductive: The uterus is enlarged.  No adnexal mass. Other: None Musculoskeletal: Bilateral total hip arthroplasty. No acute fracture. IMPRESSION: 1.  No evidence for acute  abnormality. 2. Evaluation of the pelvis degraded by metallic artifact in bilateral total hip arthroplasty. 3. Enlargement of the uterus, similar in appearance to previous MRI in 2014. 4. Transvenous pacemaker. Electronically Signed   By: EliNolon NationsD.   On: 04/20/2016 11:22    ASSESSMENT AND PLAN:    Principal Problem:   Sepsis ([place] Problems:   Chest pain   Hypertension   CAD (coronary artery disease), native coronary artery   Chronic combined systolic and diastolic CHF (congestive heart failure) ([facility])   DM [place]diabetes mellitus) ([place] UTI (urinary tract infection)  ShiParalee Cancel  Longanecker is a 66 y.o. female with a history of NICM with LBBB s/p MDT CRT-D (10/2012), HTN, DMT2, HLD, COPD on exertional O2, restrictive lung disease by PFTs, PAF, and medical non compliance who presented to [facility] on 04/20/16 with CP/SOB/abdominal pain with N/V. Found to have urosepsis and bacteremia. Cardiology consulted with worsening LVEF and mildly elevated troponin.   Urosepsis with bacteremia and lactic acidosis: abx and IVFs per PCCM  Chronic combined S/D CHF: EF now back down to 30-35% from 45-50%  in 08/2015. Has G2DD. It has been as low as 20-25% back in 2014. Worsening LV function likely related to sepsis. She does not appear volume overloaded currently. Watch carefully with IVF administration.  -- Holding Coreg, vasotec, lasix, aldactone, Bidil given hypotension. Would resume CHF meds as BP and renal function improve, starting with Coreg.  -- Concern for compliance. She has run out of several meds at home- not sure which ones she has and doesn't have  NICM: s/p MDT CRT-D in 10/2012.  Elevated troponin: 0.10--> 0.12. Likely demand ischemic in the setting of sepsis. Cath in 2009 and 2014 showed minimal CAD. Continue to trend enzymes. She is currently on IV heparin although this is not ACS  AKI: creat up from 1.02--> 2.09, likely related to UTI and hypotension. Holding antihypertensives. CT abdomen with no evidence of hydronephrosis.  HTN: all antihypertensives are held given hypotension   HLD: continue statin   DMT2 with hyperglycemia: BG improving. Continue SSI   PAF: Eliquis is on home med list. Dr. Aundra Dubin did not address this last office visit. Eliquis held and currently on IV heparin. She is currently maintaining NSR.   Signed Angelena Form, PA-C 04/21/2016 4:39 PM  Pager [phone]  Patient seen and examined with Nell Range, PA-C. We discussed all aspects of the encounter. I agree with the assessment and plan as stated above.  66 y/o woman with multiple medical problems including CHF with NICM previously followed by Dr. Aundra Dubin in [facility] but patient has been somewhat uncompliant with f/u. Now admitted with ecoli urosepsis and shock complicated by AKI.   Echo reviewed personally and EF down from 45-50% to about 35%. This may be related to sepsis but more likely I think it is related to several months of noncompliance with her meds. Currently appears volume replete and close to her baseline weight of 215-220 pounds. Agree with holding HF meds for now. I will stop IVF and  may need gentle diuresis tomorrow as renal function tolerates. With AKI may be best to start with hydralazine/nitrates as she stabilizes. Would be careful with b-block until we make sure end-organ function is going in the right direction. Will need to restart NOAC prior to d/c.  Echo without evidence of endocarditis. Given low predilection for ecoli to cause endocarditis, I do not think she needs TEE to look ar valves or ICD at this point.  Dr. Aundra Dubin will see in am.  Glori Bickers, MD  11:07 PM

## 2016-04-21 NOTE — Progress Notes (Signed)
Inpatient Diabetes Program Recommendations  AACE/ADA: New Consensus Statement on Inpatient Glycemic Control (2015)  Target Ranges:  Prepandial:   less than 140 mg/dL      Peak postprandial:   less than 180 mg/dL (1-2 hours)      Critically ill patients:  140 - 180 mg/dL   Lab Results  Component Value Date   GLUCAP 370 (H) 04/21/2016   HGBA1C 11.5 (H) 07/19/2015    Review of Glycemic Control Results for [name], [name] (MRN [id number]) as of 04/21/2016 08:51  Ref. Range 04/20/2016 18:51 04/20/2016 20:11 04/20/2016 22:42 04/21/2016 05:17 04/21/2016 08:31  Glucose-Capillary Latest Ref Range: 65 - 99 mg/dL 500 (H) 427 (H) 318 (H) 334 (H) 370 (H)    Diabetes history: Type 2 diabetes Outpatient Diabetes medications: Lantus 50 units q HS, Metformin 1000 mg bid Current orders for Inpatient glycemic control:  Novolog moderate tid with meals  Inpatient Diabetes Program Recommendations:    Note that admit blood sugar>700 mg/dL.  Note that patient is on CHO modified diet.  May consider increasing frequency of Novolog correction to q 4 hours.  Also please add Lantus 28 units daily.  May also benefit from the addition of Novolog meal coverage.  Last documented A1C=11.5% in 2017.  Needs updated A1C.   Thanks, Adah Perl, RN, BC-ADM Inpatient Diabetes Coordinator Pager [phone] (8a-5p)

## 2016-04-21 NOTE — Progress Notes (Signed)
  Echocardiogram 2D Echocardiogram has been performed.  [name] 04/21/2016, 10:17 AM

## 2016-04-21 NOTE — Progress Notes (Signed)
PHARMACY - PHYSICIAN COMMUNICATION CRITICAL VALUE ALERT - BLOOD CULTURE IDENTIFICATION (BCID)  Results for orders placed or performed during the hospital encounter of 04/20/16  Blood Culture ID Panel (Reflexed) (Collected: 04/20/2016 10:35 AM)  Result Value Ref Range   Enterococcus species NOT DETECTED NOT DETECTED   Listeria monocytogenes NOT DETECTED NOT DETECTED   Staphylococcus species NOT DETECTED NOT DETECTED   Staphylococcus aureus NOT DETECTED NOT DETECTED   Streptococcus species NOT DETECTED NOT DETECTED   Streptococcus agalactiae NOT DETECTED NOT DETECTED   Streptococcus pneumoniae NOT DETECTED NOT DETECTED   Streptococcus pyogenes NOT DETECTED NOT DETECTED   Acinetobacter baumannii NOT DETECTED NOT DETECTED   Enterobacteriaceae species DETECTED (A) NOT DETECTED   Enterobacter cloacae complex NOT DETECTED NOT DETECTED   Escherichia coli DETECTED (A) NOT DETECTED   Klebsiella oxytoca NOT DETECTED NOT DETECTED   Klebsiella pneumoniae NOT DETECTED NOT DETECTED   Proteus species NOT DETECTED NOT DETECTED   Serratia marcescens NOT DETECTED NOT DETECTED   Carbapenem resistance NOT DETECTED NOT DETECTED   Haemophilus influenzae NOT DETECTED NOT DETECTED   Neisseria meningitidis NOT DETECTED NOT DETECTED   Pseudomonas aeruginosa NOT DETECTED NOT DETECTED   Candida albicans NOT DETECTED NOT DETECTED   Candida glabrata NOT DETECTED NOT DETECTED   Candida krusei NOT DETECTED NOT DETECTED   Candida parapsilosis NOT DETECTED NOT DETECTED   Candida tropicalis NOT DETECTED NOT DETECTED    Name of physician (or Provider) Contacted: Dr. Chase Caller  Changes to prescribed antibiotics required: Consider narrow to Rocephin 2gm IV q24h May wait and let rounding MD in a.m. decide but will at least consider d/c Vanc.   Sherlon Handing, PharmD, BCPS Clinical pharmacist, pager [phone] 04/21/2016  1:08 AM

## 2016-04-21 NOTE — Consult Note (Signed)
PULMONARY / CRITICAL CARE MEDICINE   Name: [name] MRN: [id number] DOB: 1950/02/13    ADMISSION DATE:  04/20/2016 CONSULTATION DATE:  3/26  REFERRING MD:  Janne Napoleon   CHIEF COMPLAINT:  Sepsis   HISTORY OF PRESENT ILLNESS:   This is a 66 year old female w/ sig of: combined systolic/diastolic HF (EF 42-68%), s/p ICD placement, DM, and HTN. Presented to the ED w/ cc: abd pain, nausea, vomiting, and chest discomfort. In ER found to have temp 103.9, met SIRS criteria, wbc ct [address]900, UA was c/w UTI. While awaiting admission BP dropped to 68/49, lactic acid was 3.5. Antihypertensives were held. She was given IVFs & ABX had already been administered.  Lactic acid cleared to 2.6. At time of formal pulmonary consult she is 2.5 liters positive, Her BP has normalized, and both blood and urine are growing GNRs.    PAST MEDICAL HISTORY :  She  has a past medical history of Anemia; Arthritis; Automatic implantable cardioverter-defibrillator in situ; CAD (coronary artery disease), native coronary artery; Chronic bronchitis ([place]); Chronic combined systolic and diastolic CHF (congestive heart failure) ([place]); Chronic lower back pain; Headache(784.0); High cholesterol; History of noncompliance with medical treatment; Hypertension; LBBB (left bundle branch block); Orthopnea; Tobacco abuse; and Type II diabetes mellitus ([place]).  PAST SURGICAL HISTORY: She  has a past surgical history that includes Joint replacement; Cardiac catheterization (03/04/12); Bi-ventricular implantable cardioverter defibrillator  (crt-d) (11/18/2012); left heart cath (N/A, 03/05/2012); and bi-ventricular implantable cardioverter defibrillator (N/A, 11/18/2012).  Allergies  Allergen Reactions  . Morphine And Related Nausea And Vomiting and Other (See Comments)    Severe nausea    No current facility-administered medications on file prior to encounter.    Current Outpatient Prescriptions on File Prior to Encounter  Medication Sig  .  acetaminophen (TYLENOL) 500 MG tablet Take 1 tablet (500 mg total) by mouth every 6 (six) hours as needed. (Patient taking differently: Take 500 mg by mouth every 6 (six) hours as needed for moderate pain. )  . albuterol (PROVENTIL HFA;VENTOLIN HFA) 108 (90 BASE) MCG/ACT inhaler Inhale 2 puffs into the lungs every 6 (six) hours as needed for wheezing or shortness of breath.  Marland Kitchen atorvastatin (LIPITOR) 40 MG tablet Take 1 tablet (40 mg total) by mouth daily.  . carvedilol (COREG) 12.5 MG tablet Take 1 tablet (12.5 mg total) by mouth 2 (two) times daily with a meal.  . enalapril (VASOTEC) 10 MG tablet Take 1 tablet (10 mg total) by mouth daily.  . furosemide (LASIX) 40 MG tablet Take 1 tablet (40 mg total) by mouth 2 (two) times daily.  . isosorbide-hydrALAZINE (BIDIL) 20-37.5 MG tablet Take 2 tablets by mouth 3 (three) times daily.  . metFORMIN (GLUCOPHAGE) 1000 MG tablet Take 1,000 mg by mouth 2 (two) times daily with a meal.  . potassium chloride SA (K-DUR,KLOR-CON) 20 MEQ tablet Take 1 tablet (20 mEq total) by mouth 2 (two) times daily.  Marland Kitchen spironolactone (ALDACTONE) 25 MG tablet Take 1 tablet (25 mg total) by mouth daily.  Marland Kitchen apixaban (ELIQUIS) 5 MG TABS tablet Take 1 tablet (5 mg total) by mouth 2 (two) times daily. (Patient not taking: Reported on 04/20/2016)  . Insulin Glargine (LANTUS SOLOSTAR) 100 UNIT/ML Solostar Pen Inject 50 Units into the skin at bedtime.  . Oxycodone HCl 10 MG TABS Take 1-2 tablets by mouth 2 (two) times daily as needed (pain).   . promethazine (PHENERGAN) 25 MG tablet Take 1 tablet (25 mg total) by mouth every 8 (  eight) hours as needed for nausea or vomiting. (Patient not taking: Reported on 04/20/2016)  . traMADol (ULTRAM) 50 MG tablet Take 1 tablet (50 mg total) by mouth every 6 (six) hours as needed for severe pain. (Patient not taking: Reported on 04/20/2016)    FAMILY HISTORY:  Her has no family status information on file.    SOCIAL HISTORY: She  reports that she  quit smoking about 4 years ago. Her smoking use included Cigarettes. She started smoking about 43 years ago. She has a 40.00 pack-year smoking history. She has never used smokeless tobacco. She reports that she does not drink alcohol or use drugs.  REVIEW OF SYSTEMS:   n/a  SUBJECTIVE:  Feels better   VITAL SIGNS: BP 128/78   Pulse 89   Temp 98.7 F (37.1 C) (Oral)   Resp 18   Ht '5\' 7"'  (1.702 m)   Wt 208 lb (94.3 kg)   SpO2 99%   BMI 32.58 kg/m   HEMODYNAMICS:    VENTILATOR SETTINGS:    INTAKE / OUTPUT: I/O last 3 completed shifts: In: 2216.7 [I.V.:1466.7; IV Piggyback:750] Out: 250 [Urine:250]  General appearance:  66 year old female, well nourished currently in acute distress, confused,  conversant  Eyes: anicteric sclerae, moist conjunctivae; PERRL, EOMI bilaterally. Mouth:  membranes and no mucosal ulcerations; normal hard and soft palate Neck: Trachea midline; neck supple, no JVD Lungs/chest: CTA, with normal respiratory effort and no intercostal retractions CV: RRR, no MRGs  Abdomen: Soft, non-tender; no masses or HSM Extremities: No peripheral edema or extremity lymphadenopathy Skin: Normal temperature, turgor and texture; no rash, ulcers or subcutaneous nodules Neuro/Psych: Appropriate affect, alert and oriented to person, place and time   LABS:  BMET  Recent Labs Lab 04/20/16 0928 04/20/16 1425 04/20/16 1917 04/21/16 0207  NA 126*  --  130* 135  K 4.1  --  3.3* 3.9  CL 88*  --  92* 98*  CO2 24  --  25 25  BUN 20  --  21* 26*  CREATININE 1.42* 1.47* 1.68* 2.09*  GLUCOSE 727*  --  526* 361*    Electrolytes  Recent Labs Lab 04/20/16 0928 04/20/16 1917 04/21/16 0207  CALCIUM 8.7* 8.2* 8.0*    CBC  Recent Labs Lab 04/20/16 0928 04/20/16 1425 04/21/16 0207  WBC 24.9* 22.9* 26.6*  HGB 12.3 10.4* 11.1*  HCT 35.7* 30.5* 32.0*  PLT 364 366 299    Coag's  Recent Labs Lab 04/20/16 1425  APTT 24  INR 1.18    Sepsis  Markers  Recent Labs Lab 04/20/16 1425  04/20/16 1633 04/20/16 1917 04/20/16 2147  LATICACIDVEN 3.1*  < > 3.5* 2.6* 2.2*  PROCALCITON 46.10  --   --   --   --   < > = values in this interval not displayed.  ABG No results for input(s): PHART, PCO2ART, PO2ART in the last 168 hours.  Liver Enzymes  Recent Labs Lab 04/20/16 0928  AST 21  ALT 14  ALKPHOS 119  BILITOT 0.5  ALBUMIN 3.0*    Cardiac Enzymes  Recent Labs Lab 04/20/16 0928 04/20/16 1425  TROPONINI 0.10* 0.12*    Glucose  Recent Labs Lab 04/20/16 1851 04/20/16 2011 04/20/16 2242 04/21/16 0517 04/21/16 0831 04/21/16 1114  GLUCAP 500* 427* 318* 334* 370* 400*    Imaging No results found.   STUDIES:    CULTURES: Urine culture 3/25: GNR>>> BCX2 3/25: GNR>>>  ANTIBIOTICS: Cefepime 3/25>>> vanc 3/25 X 1  SIGNIFICANT EVENTS:  LINES/TUBES:   ASSESSMENT / PLAN:  Severe sepsis/ resolved septic shock & lactic acidosis in setting GNR UTI and Bacteremia. May have been exacerbated by antihypertensives.  Plan Cont IVFs Cont PCT algo Cont Maxipime narrow when we have more C&S data OK to transfer out of ICU   Mild troponin elevation w/ known h/o combined systolic and diastolic HF -ECHO w/ global hypokinesis and reduction from 45-50% now down to 30-35% Plan Cont tele  Repeat CEs X 1 Will need cards consult prior to dc  Holding Coreg, vasotec, lasix, aldactone, Bidil and lipitor Low dose lopressor for HR > 120  AKI Last creatinine was climbing but CT abd/pelvis was negative. Suspect renal injury worsened by hypoperfusion and CT dye Plan Cont IVFs Strict I&O Renal dose meds  MAP > 65  h/o PAF Plan Cont tele  IV heparin for now till eliquis normalizes   DM type 2 w/ hyperglycemia Plan lantus 28 unit daily cont ssi and meal coverage   Anemia of chronic disease Plan Trend cbc Transfuse per protocol   Discussion Looking better. She can go to tele. Cont abx, await cultures  and sensitives to complete and then narrow   04/21/2016, 3:20 PM

## 2016-04-22 ENCOUNTER — Inpatient Hospital Stay (HOSPITAL_COMMUNITY): Payer: Medicare Other

## 2016-04-22 DIAGNOSIS — I5021 Acute systolic (congestive) heart failure: Secondary | ICD-10-CM

## 2016-04-22 DIAGNOSIS — R7881 Bacteremia: Secondary | ICD-10-CM

## 2016-04-22 DIAGNOSIS — R6521 Severe sepsis with septic shock: Secondary | ICD-10-CM

## 2016-04-22 LAB — HEPATIC FUNCTION PANEL
ALT: 24 U/L (ref 14–54)
AST: 43 U/L — ABNORMAL HIGH (ref 15–41)
Albumin: 2.4 g/dL — ABNORMAL LOW (ref 3.5–5.0)
Alkaline Phosphatase: 122 U/L (ref 38–126)
Bilirubin, Direct: 0.1 mg/dL (ref 0.1–0.5)
Indirect Bilirubin: 0.6 mg/dL (ref 0.3–0.9)
Total Bilirubin: 0.7 mg/dL (ref 0.3–1.2)
Total Protein: 6.7 g/dL (ref 6.5–8.1)

## 2016-04-22 LAB — GLUCOSE, CAPILLARY
Glucose-Capillary: 197 mg/dL — ABNORMAL HIGH (ref 65–99)
Glucose-Capillary: 454 mg/dL — ABNORMAL HIGH (ref 65–99)
Glucose-Capillary: 361 mg/dL — ABNORMAL HIGH (ref 65–99)
Glucose-Capillary: 372 mg/dL — ABNORMAL HIGH (ref 65–99)
Glucose-Capillary: 330 mg/dL — ABNORMAL HIGH (ref 65–99)
Glucose-Capillary: 197 mg/dL — ABNORMAL HIGH (ref 65–99)

## 2016-04-22 LAB — URINE CULTURE: Culture: >=100000 — AB

## 2016-04-22 LAB — CBC
HCT: 32.1 % — ABNORMAL LOW (ref 36.0–46.0)
Hemoglobin: 11 g/dL — ABNORMAL LOW (ref 12.0–15.0)
MCH: 30.4 pg (ref 26.0–34.0)
MCHC: 34.3 g/dL (ref 30.0–36.0)
MCV: 88.7 fL (ref 78.0–100.0)
Platelets: 334 10*3/uL (ref 150–400)
RBC: 3.62 MIL/uL — ABNORMAL LOW (ref 3.87–5.11)
RDW: 12.9 % (ref 11.5–15.5)
WBC: 22.2 10*3/uL — ABNORMAL HIGH (ref 4.0–10.5)

## 2016-04-22 LAB — BASIC METABOLIC PANEL
Anion gap: 14 (ref 5–15)
BUN: 24 mg/dL — ABNORMAL HIGH (ref 6–20)
CO2: 21 mmol/L — ABNORMAL LOW (ref 22–32)
Calcium: 8.1 mg/dL — ABNORMAL LOW (ref 8.9–10.3)
Chloride: 99 mmol/L — ABNORMAL LOW (ref 101–111)
Creatinine, Ser: 1.47 mg/dL — ABNORMAL HIGH (ref 0.44–1.00)
GFR calc Af Amer: 43 mL/min — ABNORMAL LOW (ref >60)
GFR calc non Af Amer: 37 mL/min — ABNORMAL LOW (ref >60)
Glucose, Bld: 202 mg/dL — ABNORMAL HIGH (ref 65–99)
Potassium: 3.2 mmol/L — ABNORMAL LOW (ref 3.5–5.1)
Sodium: 134 mmol/L — ABNORMAL LOW (ref 135–145)

## 2016-04-22 LAB — MAGNESIUM: Magnesium: 1.8 mg/dL (ref 1.7–2.4)

## 2016-04-22 LAB — LACTIC ACID, PLASMA: Lactic Acid, Venous: 1 mmol/L (ref 0.5–1.9)

## 2016-04-22 LAB — CK TOTAL AND CKMB (NOT AT ARMC)
CK, MB: 6.1 ng/mL — ABNORMAL HIGH (ref 0.5–5.0)
Relative Index: INVALID (ref 0.0–2.5)
Total CK: 43 U/L (ref 38–234)

## 2016-04-22 LAB — LIPASE, BLOOD: Lipase: 11 U/L (ref 11–51)

## 2016-04-22 LAB — AMYLASE: Amylase: 58 U/L (ref 28–100)

## 2016-04-22 LAB — TROPONIN I: Troponin I: 0.06 ng/mL (ref <0.03)

## 2016-04-22 LAB — PHOSPHORUS: Phosphorus: 2.2 mg/dL — ABNORMAL LOW (ref 2.5–4.6)

## 2016-04-22 MED ORDER — FUROSEMIDE 40 MG PO TABS
40.0000 mg | ORAL_TABLET | Freq: Two times a day (BID) | ORAL | Status: DC
  Administered 2016-04-22 – 2016-04-25 (×7): 40 mg via ORAL
  Filled 2016-04-22 (×7): qty 1

## 2016-04-22 MED ORDER — POTASSIUM CHLORIDE CRYS ER 20 MEQ PO TBCR
40.0000 meq | EXTENDED_RELEASE_TABLET | Freq: Once | ORAL | Status: AC
  Administered 2016-04-22: 40 meq via ORAL
  Filled 2016-04-22: qty 2

## 2016-04-22 MED ORDER — DEXTROSE 5 % IV SOLN
2.0000 g | INTRAVENOUS | Status: DC
  Administered 2016-04-22 – 2016-04-23 (×2): 2 g via INTRAVENOUS
  Filled 2016-04-22 (×3): qty 2

## 2016-04-22 MED ORDER — INSULIN GLARGINE 100 UNIT/ML ~~LOC~~ SOLN
35.0000 [IU] | Freq: Every day | SUBCUTANEOUS | Status: DC
  Administered 2016-04-22: 35 [IU] via SUBCUTANEOUS
  Filled 2016-04-22 (×2): qty 0.35

## 2016-04-22 MED ORDER — MAGNESIUM SULFATE 2 GM/50ML IV SOLN
2.0000 g | Freq: Once | INTRAVENOUS | Status: AC
  Administered 2016-04-22: 2 g via INTRAVENOUS
  Filled 2016-04-22: qty 50

## 2016-04-22 MED ORDER — POTASSIUM CHLORIDE CRYS ER 20 MEQ PO TBCR
20.0000 meq | EXTENDED_RELEASE_TABLET | Freq: Two times a day (BID) | ORAL | Status: DC
  Administered 2016-04-22 – 2016-04-24 (×6): 20 meq via ORAL
  Filled 2016-04-22 (×6): qty 1

## 2016-04-22 MED ORDER — INSULIN ASPART 100 UNIT/ML ~~LOC~~ SOLN: 10.0000 [IU] | Freq: Once | SUBCUTANEOUS | Status: DC

## 2016-04-22 MED ORDER — ISOSORB DINITRATE-HYDRALAZINE 20-37.5 MG PO TABS
1.0000 | ORAL_TABLET | Freq: Three times a day (TID) | ORAL | Status: DC
  Administered 2016-04-22 – 2016-04-25 (×9): 1 via ORAL
  Filled 2016-04-22 (×10): qty 1

## 2016-04-22 MED ORDER — POTASSIUM PHOSPHATES 15 MMOLE/5ML IV SOLN
10.0000 mmol | Freq: Once | INTRAVENOUS | Status: AC
  Administered 2016-04-22: 10 mmol via INTRAVENOUS
  Filled 2016-04-22: qty 3.33

## 2016-04-22 NOTE — Progress Notes (Signed)
Pt. c/o pain unrelieved with PRN pain med. On call NP for [facility],  K. Schorr made aware via text page. RN awaiting orders. Will continue to monitor pt.

## 2016-04-22 NOTE — Progress Notes (Signed)
Page to Dr Sloan Leiter  "3e07 CBG: 141, Lantus 35u and novolog 4u given at 1005. minimal PO intake with breakfast. Please advise. "

## 2016-04-22 NOTE — Progress Notes (Signed)
Inpatient Diabetes Program Recommendations  AACE/ADA: New Consensus Statement on Inpatient Glycemic Control (2015)  Target Ranges:  Prepandial:   less than 140 mg/dL      Peak postprandial:   less than 180 mg/dL (1-2 hours)      Critically ill patients:  140 - 180 mg/dL   Results for DANELIA, SNODGRASS (MRN [id number]) as of 04/22/2016 11:35  Ref. Range 04/21/2016 08:31 04/21/2016 11:14 04/21/2016 16:17 04/21/2016 21:56 04/22/2016 07:54  Glucose-Capillary Latest Ref Range: 65 - 99 mg/dL 370 (H) 400 (H) 261 (H) 203 (H) 197 (H)  Results for LEMOYNE, SCARPATI (MRN [id number]) as of 04/22/2016 11:35  Ref. Range 07/19/2015 15:27  Hemoglobin A1C Latest Ref Range: 4.8 - 5.6 % 11.5 (H)   Review of Glycemic Control  Current orders for Inpatient glycemic control: Lantus 35 units daily, Novolog 0-20 units TID with meals, Novolog 0-5 units QHS, Novolog 8 units TID with meals for meal coverage  Inpatient Diabetes Program Recommendations: Insulin - Basal: In reviewing the chart, noted patient received Lantus 28 units on 04/21/16 at 20:03. Lantus ordered was increased to 35 units daily this morning and patient has already received Lantus 35 units today at 10:05 am.  Concerned about hypoglycemia due to Lantus dosages being given within about 14 hours of each other. Will continue to follow.  A1C: Please consider adding an A1C to blood in lab or draw with next scheduled blood draw to evaluate glycemic control over the past 2-3 months.  Thanks, Barnie Alderman, RN, MSN, CDE Diabetes Coordinator Inpatient Diabetes Program [phone] (Team Pager from 8am to 5pm)

## 2016-04-22 NOTE — Progress Notes (Signed)
Pharmacy Antibiotic Note  [name] is a 66 y.o. female admitted on 04/20/2016 and found to have E. Coli bacteremia. The patient was initially started on Cefepime however sensitivities have since resulted showing sensitivity to Rocephin. Plans are to transition to this today - the patient is improving clinically.   Plan: 1. D/c Cefepime 2. Start Rocephin 2g IV every 24 hours 3. Pharmacy will monitor peripherally for any necessary changes  Height: 5\' 7"  (170.2 cm) Weight: 212 lb 3.2 oz (96.3 kg) IBW/kg (Calculated) : 61.6  Temp (24hrs), Avg:98.9 F (37.2 C), Min:98.4 F (36.9 C), Max:99.4 F (37.4 C)   Recent Labs Lab 04/17/16 1515 04/20/16 0928  04/20/16 1425 04/20/16 1441 04/20/16 1633 04/20/16 1917 04/20/16 2147 04/21/16 0207 04/22/16 0215  WBC 16.3* 24.9*  --  22.9*  --   --   --   --  26.6* 22.2*  CREATININE 1.02* 1.42*  --  1.47*  --   --  1.68*  --  2.09* 1.47*  LATICACIDVEN  --   --   < > 3.1* 3.10* 3.5* 2.6* 2.2*  --  1.0  < > = values in this interval not displayed.  Estimated Creatinine Clearance: 46.7 mL/min (A) (by C-G formula based on SCr of 1.47 mg/dL (H)).    Allergies  Allergen Reactions  . Morphine And Related Nausea And Vomiting and Other (See Comments)    Severe nausea    Thank you for allowing pharmacy to be a part of this patient's care.  Alycia Rossetti, PharmD, BCPS Clinical Pharmacist Pager: [phone] Clinical phone for 04/22/2016 from 7a-3:30p: [phone] If after 3:30p, please call main pharmacy at: [phone] 04/22/2016 2:33 PM

## 2016-04-22 NOTE — Progress Notes (Signed)
Discussed  The benefit of hemoglobin A1c tests and how to improve your results.

## 2016-04-22 NOTE — Progress Notes (Signed)
Advanced Heart Failure Rounding Note  PCP: [name], MD Primary Cardiologist: Dr. Jeanenne Licea/[name]   Subjective:    Admitted on 04/20/16 with urosepsis with E. Coli bacteremia, was hypotensive and febrile. EF reduced from 45-50% ->30-35%. Weight 212 lbs today, baseline weight is 215-220 pounds. Her lasix was held yesterday.   Getting cefepime. Vanc discontinued yesterday.   Creatinine 1.42->1.47->1.68->2.09->1.47.   Feels bad today, having abdominal pain.   Objective:   Weight Range: 212 lb 3.2 oz (96.3 kg) Body mass index is 33.24 kg/m.   Vital Signs:   Temp:  [98.4 F (36.9 C)-99.4 F (37.4 C)] 98.4 F (36.9 C) (03/27 0755) Pulse Rate:  [79-105] 95 (03/27 0755) Resp:  [14-24] 18 (03/27 0755) BP: (85-139)/(46-93) 120/86 (03/27 0755) SpO2:  [88 %-100 %] 98 % (03/27 0755) Weight:  [212 lb 3.2 oz (96.3 kg)-215 lb 3.2 oz (97.6 kg)] 212 lb 3.2 oz (96.3 kg) (03/27 0414) Last BM Date: 04/21/16  Weight change: Filed Weights   04/20/16 1947 04/21/16 2054 04/22/16 0414  Weight: 208 lb (94.3 kg) 215 lb 3.2 oz (97.6 kg) 212 lb 3.2 oz (96.3 kg)    Intake/Output:   Intake/Output Summary (Last 24 hours) at 04/22/16 0818 Last data filed at 04/22/16 0800  Gross per 24 hour  Intake          3355.83 ml  Output             2520 ml  Net           835.83 ml     Physical Exam: General:  Ill appearing female, appears distressed.  HEENT: normal Neck: supple. JVP 8-9. Carotids 2+ bilat; no bruits. No lymphadenopathy or thyromegaly appreciated. Cor: PMI nondisplaced. Regular rate & rhythm. No rubs, gallops or murmurs. Lungs: clear bilaterally.  Abdomen: soft, tender in RLQ, nondistended. No hepatosplenomegaly. No bruits or masses. Good bowel sounds. Extremities: no cyanosis, clubbing, rash, no edema Neuro: alert & orientedx3, cranial nerves grossly intact. moves all 4 extremities w/o difficulty. Affect pleasant   Telemetry: V paced, personally reviewed.    Labs: CBC  Recent Labs  04/20/16 0928  04/21/16 0207 04/22/16 0215  WBC 24.9*  < > 26.6* 22.2*  NEUTROABS 21.2*  --   --   --   HGB 12.3  < > 11.1* 11.0*  HCT 35.7*  < > 32.0* 32.1*  MCV 90.2  < > 89.6 88.7  PLT 364  < > 299 334  < > = values in this interval not displayed. Basic Metabolic Panel  Recent Labs  04/21/16 0207 04/22/16 0215  NA 135 134*  K 3.9 3.2*  CL 98* 99*  CO2 25 21*  GLUCOSE 361* 202*  BUN 26* 24*  CREATININE 2.09* 1.47*  CALCIUM 8.0* 8.1*  MG  --  1.8  PHOS  --  2.2*   Liver Function Tests  Recent Labs  04/20/16 0928 04/22/16 0215  AST 21 43*  ALT 14 24  ALKPHOS 119 122  BILITOT 0.5 0.7  PROT 7.5 6.7  ALBUMIN 3.0* 2.4*    Recent Labs  04/20/16 0928 04/22/16 0215  LIPASE <10* 11  AMYLASE  --  58   Cardiac Enzymes  Recent Labs  04/20/16 0928 04/20/16 1425 04/22/16 0215  CKTOTAL  --   --  43  CKMB  --   --  6.1*  TROPONINI 0.10* 0.12* 0.06*    BNP: BNP (last 3 results)  Recent Labs  07/19/15 0710 08/15/15 1045 04/20/16  0928  BNP 497.8* 39.0 199.5*   Imaging/Studies:  Dg Abd Portable 1v  Result Date: 04/22/2016 CLINICAL DATA:  Abdominal pain. EXAM: PORTABLE ABDOMEN - 1 VIEW COMPARISON:  CT 2 days prior FINDINGS: No evidence of free air on supine views. No bowel dilatation to suggest obstruction. Air throughout small and large bowel, similar to recent CT. No increased stool burden to suggest constipation. Pelvic phleboliths are noted. Bilateral hip prostheses. IMPRESSION: No bowel obstruction or free air. Electronically Signed   By: Jeb Levering M.D.   On: 04/22/2016 02:19     Transthoracic Echocardiography 04/21/16 Study Conclusions  - Left ventricle: The cavity size was normal. There was moderate   concentric hypertrophy. Systolic function was moderately to   severely reduced. The estimated ejection fraction was in the   range of 30% to 35%. Global hypokinesis worse in the   anteroseptal, inferior, and  inferoseptal myocardium. Doppler   parameters are consistent with abnormal left ventricular   relaxation (grade 1 diastolic dysfunction). Doppler parameters   are consistent with high ventricular filling pressure. - Aortic valve: Transvalvular velocity was within the normal range.   There was no stenosis. There was no regurgitation. - Mitral valve: Transvalvular velocity was within the normal range.   There was no evidence for stenosis. There was no regurgitation. - Right ventricle: The cavity size was normal. Wall thickness was   normal. Systolic function was normal. - Right atrium: The atrium was mildly dilated. - Tricuspid valve: There was mild regurgitation. - Pulmonary arteries: Systolic pressure was within the normal   range. PA peak pressure: 26 mm Hg (S). - Pericardium, extracardiac: A small pericardial effusion was   identified. - Global longitudinal strain -12.5%, which is abnormal.    Medications:     Scheduled Medications: . atorvastatin  40 mg Oral Daily  . ceFEPime (MAXIPIME) IV  1 g Intravenous Q24H  . heparin  5,000 Units Subcutaneous Q8H  . insulin aspart  0-20 Units Subcutaneous TID WC  . insulin aspart  0-5 Units Subcutaneous QHS  . insulin aspart  8 Units Subcutaneous TID WC  . insulin glargine  35 Units Subcutaneous Daily  . mouth rinse  15 mL Mouth Rinse BID  . polyethylene glycol  17 g Oral Daily  . potassium phosphate IVPB (mmol)  10 mmol Intravenous Once  . sodium chloride  500 mL Intravenous Once     Infusions:   PRN Medications:  acetaminophen **OR** acetaminophen, albuterol, ondansetron **OR** ondansetron (ZOFRAN) IV, promethazine, traMADol   Assessment/Plan    1. Acute on chronic combined systolic and diastolic CHF: Nonischemic cardiomyopathy, MDT CRT-D.  EF 30-35%, down from 45-50% in 08/2015 in the setting of sepsis and questionable compliance with her medications.  On exam today, she has mild JVD.  Creatinine is coming down.  - Restart  Bidil today at 1 tab tid, BP stable - Add back home Lasix and replace K.  2. Urosepsis with bacteremia: Urine culture with E. Coli and blood cultures growing E. Coli.  - Continue cefepime.  - Lactic acid normalized.   - Echo without evidence of endocarditis  3. PAF: has Eliquis listed as a home med, started it in June 2016 after PAF was noted while she was hospitalized in June 2016.  - Restart Eliquis, had been off at home for some time.  - This patients CHA2DS2-VASc Score and unadjusted Ischemic Stroke Rate (% per year) is equal to 4.8 % stroke rate/year from a score of 4 Above score  calculated as 1 point each if present [CHF, HTN, DM, Vascular=MI/PAD/Aortic Plaque, Age if 65-74, or Female], 2 points each if present [Age > 75, or Stroke/TIA/TE]  4. HTN: BP improved, will start adding back her meds.  5. Elevated troponin: Not representative of ACS, cath in 2014 with minimal CAD.  6. AKI: In the setting of UTI, hypotension and sepsis. Resolving.  - Will continue to monitor with BMET - Improving, creatinine 1.42->1.47->1.68->2.09->1.47.   Length of Stay: [place], NP  04/22/2016, 8:18 AM Advanced Heart Failure Team  Pager [phone] M-F 7am-4pm.  Please contact [facility] for night-coverage after hours (4p -7a ) and weekends on [email or web address]  Patient seen with NP, agree with the above note.  Admitted with E coli urosepsis, developed hypotension and AKI.  Now improving.  Had been taking "some" of her meds at home but not all. EF down to 30-35%, history of nonischemic cardiomyopathy.  Suspect EF may be back down due to medication noncompliance.  BP better today, probably mild volume overload on exam.  Creatinine coming down. Will gradually restart her CHF medication regimen.  - Start back on Bidil 1 tab tid today.  - Would start back on her home Lasix + KCl.    History of PAF, has been on Eliquis in the past but has not taken for some time.  Will restart.   Continuing cefepime for  urosepsis.   [name] 04/22/2016 10:39 AM

## 2016-04-22 NOTE — Progress Notes (Signed)
Patients acrylic nail tore off of her left finger. Bleeding controlled. Pressure bandage created with tegaderm and gauze due to current blood thinner therapy.   No further complications.

## 2016-04-22 NOTE — Progress Notes (Signed)
Per Dr Sloan Leiter, He expects glucose to come down, due to recent administration of lantus.   Verbal order received for 10u novolog now and frequent rechecks following.

## 2016-04-22 NOTE — Progress Notes (Signed)
RN call back  S: currently sleeping without pain O  RN repeat exam showed soft abd with some tenderness in suprapubic area   Labs reviwed    LIPASE - 11  PULMONARY No results for input(s): PHART, PCO2ART, PO2ART, HCO3, TCO2, O2SAT in the last 168 hours.  Invalid input(s): PCO2, PO2  CBC  Recent Labs Lab 04/20/16 1425 04/21/16 0207 04/22/16 0215  HGB 10.4* 11.1* 11.0*  HCT 30.5* 32.0* 32.1*  WBC 22.9* 26.6* 22.2*  PLT 366 299 334    COAGULATION  Recent Labs Lab 04/20/16 1425  INR 1.18    CARDIAC   Recent Labs Lab 04/20/16 0928 04/20/16 1425 04/22/16 0215  TROPONINI 0.10* 0.12* 0.06*   No results for input(s): PROBNP in the last 168 hours.   CHEMISTRY  Recent Labs Lab 04/17/16 1515 04/20/16 0928 04/20/16 1425 04/20/16 1917 04/21/16 0207 04/22/16 0215  NA 133* 126*  --  130* 135 134*  K 4.0 4.1  --  3.3* 3.9 3.2*  CL 97* 88*  --  92* 98* 99*  CO2 26 24  --  25 25 21*  GLUCOSE 449* 727*  --  526* 361* 202*  BUN 14 20  --  21* 26* 24*  CREATININE 1.02* 1.42* 1.47* 1.68* 2.09* 1.47*  CALCIUM 9.4 8.7*  --  8.2* 8.0* 8.1*  MG  --   --   --   --   --  1.8  PHOS  --   --   --   --   --  2.2*   Estimated Creatinine Clearance: 47 mL/min (A) (by C-G formula based on SCr of 1.47 mg/dL (H)).   LIVER  Recent Labs Lab 04/20/16 0928 04/20/16 1425 04/22/16 0215  AST 21  --  43*  ALT 14  --  24  ALKPHOS 119  --  122  BILITOT 0.5  --  0.7  PROT 7.5  --  6.7  ALBUMIN 3.0*  --  2.4*  INR  --  1.18  --      INFECTIOUS  Recent Labs Lab 04/20/16 1425  04/20/16 1917 04/20/16 2147 04/22/16 0215  LATICACIDVEN 3.1*  < > 2.6* 2.2* 1.0  PROCALCITON 46.10  --   --   --   --   < > = values in this interval not displayed.   ENDOCRINE CBG (last 3)   Recent Labs  04/21/16 1114 04/21/16 1617 04/21/16 2156  GLUCAP 400* 261* 203*         IMAGING x48h  - image(s) personally visualized  -   highlighted in bold Dg Chest 2 View  Result  Date: 04/20/2016 CLINICAL DATA:  Pt c/o centralized chest pain that radiates to her back since Tuesday. Pt endorse n/v and SOB. Hx of defibrillator, coronary artery disease, chronic bronchitis, chronic combined systolic and diastolic CHF, hypertension. EXAM: CHEST  2 VIEW COMPARISON:  04/17/2016 FINDINGS: Heart is mildly enlarged. No pulmonary edema. patient's left-sided transvenous pacemaker with leads to the right atrium, right ventricle, and coronary sinus. Overlying the right lung apex, there is question of a pulmonary nodule warranting further evaluation. IMPRESSION: 1. Mild cardiomegaly without pulmonary edema. 2. Question of right upper lobe pulmonary nodule. Further evaluation with CT of the chest is recommended. Electronically Signed   By: Nolon Nations M.D.   On: 04/20/2016 10:18   Ct Angio Chest Pe W Or Wo Contrast  Result Date: 04/20/2016 CLINICAL DATA:  Patient with chest pain and abdominal pain. Nausea and vomiting. Possible pulmonary  nodule on prior chest radiograph. EXAM: CT ANGIOGRAPHY CHEST WITH CONTRAST TECHNIQUE: Multidetector CT imaging of the chest was performed using the standard protocol during bolus administration of intravenous contrast. Multiplanar CT image reconstructions and MIPs were obtained to evaluate the vascular anatomy. CONTRAST:  80 cc Isovue 370 COMPARISON:  Chest radiograph earlier same day FINDINGS: Cardiovascular: No abnormal filling defect identified within the pulmonary arterial system to suggest pulmonary embolus. Evaluation is somewhat limited due to body habitus and motion artifact. Heart is enlarged. No pericardial effusion. Aorta and main pulmonary artery normal in caliber. Mediastinum/Nodes: No enlarged axillary, mediastinal or hilar lymphadenopathy. The esophagus is normal in appearance. Lungs/Pleura: Central airways are patent. Depending ground-glass opacities within the lower lobes bilaterally most compatible with atelectasis. No large area of pulmonary  consolidation. No pleural effusion or pneumothorax. Upper Abdomen: Unremarkable. Musculoskeletal: Mid thoracic spine degenerative changes. Review of the MIP images confirms the above findings. IMPRESSION: No evidence for pulmonary embolus. Dependent atelectasis. Electronically Signed   By: Lovey Newcomer M.D.   On: 04/20/2016 14:31   Ct Abdomen Pelvis W Contrast  Result Date: 04/20/2016 CLINICAL DATA:  Pt c/o LLQ pain and N/V for the past 4 days Isovue 300 47ml pt is on metformin instructed to discontinue for 48 hrs ^130mL ISOVUE-300 IOPAMIDOL (ISOVUE-300) INJECTION 61% EXAM: CT ABDOMEN AND PELVIS WITH CONTRAST TECHNIQUE: Multidetector CT imaging of the abdomen and pelvis was performed using the standard protocol following bolus administration of intravenous contrast. CONTRAST:  138mL ISOVUE-300 IOPAMIDOL (ISOVUE-300) INJECTION 61% COMPARISON:  07/05/2012 MRI pelvis, 01/26/2012 CT the abdomen and pelvis FINDINGS: Lower chest: Pacemaker leads in the coronary sinus and right ventricle. Heart size is normal. Lung bases clear. Hepatobiliary: No focal liver abnormality is seen. No gallstones, gallbladder wall thickening, or biliary dilatation. Pancreas: Unremarkable. No pancreatic ductal dilatation or surrounding inflammatory changes. Spleen: Normal in size without focal abnormality. Adrenals/Urinary Tract: Adrenal glands are normal. There is symmetric enhancement of the kidneys. No renal mass or evidence for urinary tract obstruction. The distal ureters and bladder are partially obscured by a metallic artifact in the femurs. Stomach/Bowel: Stomach and small bowel loops are normal in appearance. Colon is normal in appearance. No evidence for acute appendicitis. Average stool burden. Vascular/Lymphatic: No significant vascular findings are present. No enlarged abdominal or pelvic lymph nodes. Reproductive: The uterus is enlarged.  No adnexal mass. Other: None Musculoskeletal: Bilateral total hip arthroplasty. No acute  fracture. IMPRESSION: 1.  No evidence for acute  abnormality. 2. Evaluation of the pelvis degraded by metallic artifact in bilateral total hip arthroplasty. 3. Enlargement of the uterus, similar in appearance to previous MRI in 2014. 4. Transvenous pacemaker. Electronically Signed   By: Nolon Nations M.D.   On: 04/20/2016 11:22   Dg Abd Portable 1v  Result Date: 04/22/2016 CLINICAL DATA:  Abdominal pain. EXAM: PORTABLE ABDOMEN - 1 VIEW COMPARISON:  CT 2 days prior FINDINGS: No evidence of free air on supine views. No bowel dilatation to suggest obstruction. Air throughout small and large bowel, similar to recent CT. No increased stool burden to suggest constipation. Pelvic phleboliths are noted. Bilateral hip prostheses. IMPRESSION: No bowel obstruction or free air. Electronically Signed   By: Jeb Levering M.D.   On: 04/22/2016 02:19   A/P Abd pain - better Hypokalemia Hypomagnesemia Hypophosphatemia  Plan Replete K, phos , mag  Monitor   Dr. Brand Males, M.D., [facility].C.P Pulmonary and Critical Care Medicine Staff Physician [facility] Pager: [phone]  [phone], If no answer or between  15:00h - 7:00h: call [phone]  04/22/2016 3:45 AM

## 2016-04-22 NOTE — Progress Notes (Signed)
Care order instructions to page PCCM for pt. PCCM paged.

## 2016-04-22 NOTE — Progress Notes (Signed)
Clarification of insulin orders. D/c verbal for 10u, hold 8u addition per [facility] orders, give pt 20 units even as he does expect her glucose to come down.

## 2016-04-22 NOTE — Progress Notes (Signed)
[place] Progress Note Patient Name: [name] DOB: 12/27/1950 MRN: [id number]   Date of Service  04/22/2016  HPI/Events of Note  RN calling with persistent abd pain and back pain and earlier today. LAst CT abd 04/20/16  - ok. Patient wanting Rx med  eICU Interventions  Stat abd port xray + cbc, bmet, trop, lactate, lft, ck, lipase, amylase check     Intervention Category Intermediate Interventions: Pain - evaluation and management  Winferd Wease 04/22/2016, 1:52 AM

## 2016-04-22 NOTE — Progress Notes (Signed)
PROGRESS NOTE        PATIENT DETAILS Name: [name] Age: 66 y.o. Sex: female Date of Birth: 02/28/1950 Admit Date: 04/20/2016 Admitting Physician Maren Reamer, MD [id number], Gwynneth Munson, MD  Brief Narrative: Patient is a 66 y.o. female with combined chronic systolic/diastolic heart failure, ICD placement, diabetes, hypertension presented to the ED on 3/25 with septic shock secondary to gram-negative bacteremia. Managed initially by [facility], transferred to the [facility] on 3/27.  Subjective: Lying comfortably in bed-no chest pain or shortness of breath.  Assessment/Plan: Septic shock: Secondary to Escherichia coli UTI and Escherichia coli bacteremia. Sepsis pathophysiology has resolved, she is clinically improved. Leukocytosis persists, but slowly improving.  Escherichia coli UTI with bacteremia: Continue cefepime, await final cultures. She is clinically improved with resolution of septic shock   Acute kidney injury: Hemodynamically mediated in a setting of septic shock. Improving with supportive measures.  Type 2 diabetes with hyperglycemia: CBGs still uncontrolled, increase Lantus to 35 units, continue NovoLog 8 units with meals. Follow and adjust accordingly.  Acute on chronic combined systolic and diastolic CHF (EF 09-32% on TTE 04/21/16): EF significantly reduced from prior-etiology thought to be either secondary to sepsis or due to noncompliance. Since hemodynamically stable, she has been started on Lasix. Cardiology following.  Paroxysmal atrial fibrillation: Currently not on any rate control medications, on Eliquis. CHA2DSVAS2C score of atleast 4.  Hypertension: Her pressure improving-started back on BiDil  Minimally elevated troponin: Trend is flat and not consistent with ACS. LHC in 2014 with minimal CAD.   DVT Prophylaxis: Full dose anticoagulation with Eliquis  Code Status: Full code   Family Communication: None at  bedside  Disposition Plan: Remain inpatient-home in the next few days.   Antimicrobial agents: Anti-infectives    Start     Dose/Rate Route Frequency Ordered Stop   04/21/16 1200  cefTRIAXone (ROCEPHIN) 1 g in dextrose 5 % 50 mL IVPB  Status:  Discontinued     1 g 100 mL/hr over 30 Minutes Intravenous Every 24 hours 04/20/16 1023 04/20/16 1815   04/20/16 1830  vancomycin (VANCOCIN) IVPB 750 mg/150 ml premix  Status:  Discontinued     750 mg 150 mL/hr over 60 Minutes Intravenous Every 12 hours 04/20/16 1822 04/21/16 0918   04/20/16 1830  ceFEPIme (MAXIPIME) 1 g in dextrose 5 % 50 mL IVPB     1 g 100 mL/hr over 30 Minutes Intravenous Every 24 hours 04/20/16 1822     04/20/16 1030  cefTRIAXone (ROCEPHIN) 2 g in dextrose 5 % 50 mL IVPB     2 g 100 mL/hr over 30 Minutes Intravenous  Once 04/20/16 1022 04/20/16 1256      Procedures: Echo 3/26>> - Left ventricle: The cavity size was normal. There was moderate   concentric hypertrophy. Systolic function was moderately to   severely reduced. The estimated ejection fraction was in the   range of 30% to 35%. Global hypokinesis worse in the   anteroseptal, inferior, and inferoseptal myocardium. Doppler   parameters are consistent with abnormal left ventricular   relaxation (grade 1 diastolic dysfunction). Doppler parameters   are consistent with high ventricular filling pressure. - Aortic valve: Transvalvular velocity was within the normal range.   There was no stenosis. There was no regurgitation. - Mitral valve: Transvalvular velocity was within the normal range.   There was  no evidence for stenosis. There was no regurgitation. - Right ventricle: The cavity size was normal. Wall thickness was   normal. Systolic function was normal. - Right atrium: The atrium was mildly dilated. - Tricuspid valve: There was mild regurgitation. - Pulmonary arteries: Systolic pressure was within the normal   range. PA peak pressure: 26 mm Hg (S). -  Pericardium, extracardiac: A small pericardial effusion was   identified. - Global longitudinal strain -12.5%, which is abnormal.  CONSULTS:  cardiology and pulmonary/intensive care  Time spent: 25- minutes-Greater than 50% of this time was spent in counseling, explanation of diagnosis, planning of further management, and coordination of care.  MEDICATIONS: Scheduled Meds: . atorvastatin  40 mg Oral Daily  . ceFEPime (MAXIPIME) IV  1 g Intravenous Q24H  . furosemide  40 mg Oral BID  . heparin  5,000 Units Subcutaneous Q8H  . insulin aspart  0-20 Units Subcutaneous TID WC  . insulin aspart  0-5 Units Subcutaneous QHS  . insulin aspart  8 Units Subcutaneous TID WC  . insulin glargine  35 Units Subcutaneous Daily  . isosorbide-hydrALAZINE  1 tablet Oral TID  . mouth rinse  15 mL Mouth Rinse BID  . polyethylene glycol  17 g Oral Daily  . potassium chloride  20 mEq Oral BID  . potassium phosphate IVPB (mmol)  10 mmol Intravenous Once  . sodium chloride  500 mL Intravenous Once   Continuous Infusions: PRN Meds:.acetaminophen **OR** acetaminophen, albuterol, ondansetron **OR** ondansetron (ZOFRAN) IV, promethazine, traMADol   PHYSICAL EXAM: Vital signs: Vitals:   04/21/16 2054 04/22/16 0043 04/22/16 0414 04/22/16 0755  BP: (!) 120/93 139/82 (!) 131/59 120/86  Pulse: (!) 105 (!) 105 (!) 102 95  Resp: 18 18 18 18   Temp: 99 F (37.2 C) 99.4 F (37.4 C) 99.2 F (37.3 C) 98.4 F (36.9 C)  TempSrc: Oral Oral Oral Oral  SpO2: 100% 96% 98% 98%  Weight: 97.6 kg (215 lb 3.2 oz)  96.3 kg (212 lb 3.2 oz)   Height: 5\' 7"  (1.702 m)      Filed Weights   04/20/16 1947 04/21/16 2054 04/22/16 0414  Weight: 94.3 kg (208 lb) 97.6 kg (215 lb 3.2 oz) 96.3 kg (212 lb 3.2 oz)   Body mass index is 33.24 kg/m.   General appearance :Awake, alert, not in any distress. Speech Clear.  Eyes:, pupils equally reactive to light and accomodation,no scleral icterus. HEENT: Atraumatic and  Normocephalic Neck: supple, no JVD. No cervical lymphadenopathy. Resp:Good air entry bilaterally, no added sounds  CVS: S1 S2 regular GI: Bowel sounds present, Non tender and not distended with no gaurding, rigidity or rebound. Extremities: B/L Lower Ext shows no edema, both legs are warm to touch Neurology:  speech clear,Non focal, sensation is grossly intact. Psychiatric: Normal judgment and insight. Alert and oriented x 3. Normal mood. Musculoskeletal:No digital cyanosis Skin:No Rash, warm and dry Wounds:N/A  I have personally reviewed following labs and imaging studies  LABORATORY DATA: CBC:  Recent Labs Lab 04/17/16 1515 04/20/16 0928 04/20/16 1425 04/21/16 0207 04/22/16 0215  WBC 16.3* 24.9* 22.9* 26.6* 22.2*  NEUTROABS  --  21.2*  --   --   --   HGB 13.3 12.3 10.4* 11.1* 11.0*  HCT 38.1 35.7* 30.5* 32.0* 32.1*  MCV 87.6 90.2 88.7 89.6 88.7  PLT 357 364 366 299 154    Basic Metabolic Panel:  Recent Labs Lab 04/17/16 1515 04/20/16 0928 04/20/16 1425 04/20/16 1917 04/21/16 0207 04/22/16 0215  NA 133*  126*  --  130* 135 134*  K 4.0 4.1  --  3.3* 3.9 3.2*  CL 97* 88*  --  92* 98* 99*  CO2 26 24  --  25 25 21*  GLUCOSE 449* 727*  --  526* 361* 202*  BUN 14 20  --  21* 26* 24*  CREATININE 1.02* 1.42* 1.47* 1.68* 2.09* 1.47*  CALCIUM 9.4 8.7*  --  8.2* 8.0* 8.1*  MG  --   --   --   --   --  1.8  PHOS  --   --   --   --   --  2.2*    GFR: Estimated Creatinine Clearance: 46.7 mL/min (A) (by C-G formula based on SCr of 1.47 mg/dL (H)).  Liver Function Tests:  Recent Labs Lab 04/20/16 0928 04/22/16 0215  AST 21 43*  ALT 14 24  ALKPHOS 119 122  BILITOT 0.5 0.7  PROT 7.5 6.7  ALBUMIN 3.0* 2.4*    Recent Labs Lab 04/20/16 0928 04/22/16 0215  LIPASE <10* 11  AMYLASE  --  58   No results for input(s): AMMONIA in the last 168 hours.  Coagulation Profile:  Recent Labs Lab 04/20/16 1425  INR 1.18    Cardiac Enzymes:  Recent Labs Lab  04/20/16 0928 04/20/16 1425 04/22/16 0215  CKTOTAL  --   --  43  CKMB  --   --  6.1*  TROPONINI 0.10* 0.12* 0.06*    BNP (last 3 results) No results for input(s): PROBNP in the last 8760 hours.  HbA1C: No results for input(s): HGBA1C in the last 72 hours.  CBG:  Recent Labs Lab 04/21/16 0831 04/21/16 1114 04/21/16 1617 04/21/16 2156 04/22/16 0754  GLUCAP 370* 400* 261* 203* 197*    Lipid Profile: No results for input(s): CHOL, HDL, LDLCALC, TRIG, CHOLHDL, LDLDIRECT in the last 72 hours.  Thyroid Function Tests: No results for input(s): TSH, T4TOTAL, FREET4, T3FREE, THYROIDAB in the last 72 hours.  Anemia Panel: No results for input(s): VITAMINB12, FOLATE, FERRITIN, TIBC, IRON, RETICCTPCT in the last 72 hours.  Urine analysis:    Component Value Date/Time   COLORURINE YELLOW 04/20/2016 0935   APPEARANCEUR CLOUDY (A) 04/20/2016 0935   LABSPEC 1.018 04/20/2016 0935   PHURINE 5.0 04/20/2016 0935   GLUCOSEU >=500 (A) 04/20/2016 0935   HGBUR SMALL (A) 04/20/2016 0935   BILIRUBINUR NEGATIVE 04/20/2016 0935   KETONESUR NEGATIVE 04/20/2016 0935   PROTEINUR 30 (A) 04/20/2016 0935   UROBILINOGEN 0.2 10/04/2014 1450   NITRITE NEGATIVE 04/20/2016 0935   LEUKOCYTESUR MODERATE (A) 04/20/2016 0935    Sepsis Labs: Lactic Acid, Venous    Component Value Date/Time   LATICACIDVEN 1.0 04/22/2016 0215    MICROBIOLOGY: Recent Results (from the past 240 hour(s))  Urine culture     Status: Abnormal   Collection Time: 04/20/16  9:35 AM  Result Value Ref Range Status   Specimen Description URINE, RANDOM  Final   Special Requests NONE  Final   Culture >=100,000 COLONIES/mL ESCHERICHIA COLI (A)  Final   Report Status 04/22/2016 FINAL  Final   Organism ID, Bacteria ESCHERICHIA COLI (A)  Final      Susceptibility   Escherichia coli - MIC*    AMPICILLIN >=32 RESISTANT Resistant     CEFAZOLIN <=4 SENSITIVE Sensitive     CEFTRIAXONE <=1 SENSITIVE Sensitive     CIPROFLOXACIN  <=0.25 SENSITIVE Sensitive     GENTAMICIN <=1 SENSITIVE Sensitive     IMIPENEM <=0.25 SENSITIVE  Sensitive     NITROFURANTOIN <=16 SENSITIVE Sensitive     TRIMETH/SULFA <=20 SENSITIVE Sensitive     AMPICILLIN/SULBACTAM 16 INTERMEDIATE Intermediate     PIP/TAZO <=4 SENSITIVE Sensitive     Extended ESBL NEGATIVE Sensitive     * >=100,000 COLONIES/mL ESCHERICHIA COLI  Blood Culture (routine x 2)     Status: Abnormal (Preliminary result)   Collection Time: 04/20/16 10:35 AM  Result Value Ref Range Status   Specimen Description BLOOD LEFT FOREARM  Final   Special Requests BOTTLES DRAWN AEROBIC AND ANAEROBIC 5CC  Final   Culture  Setup Time   Final    IN BOTH AEROBIC AND ANAEROBIC BOTTLES GRAM NEGATIVE RODS CRITICAL RESULT CALLED TO, READ BACK BY AND VERIFIED WITH: KAMEND(PHARMd) BY TCLEVELAND 04/21/16 AT 12:58AM    Culture ESCHERICHIA COLI SUSCEPTIBILITIES TO FOLLOW  (A)  Final   Report Status PENDING  Incomplete  Blood Culture ID Panel (Reflexed)     Status: Abnormal   Collection Time: 04/20/16 10:35 AM  Result Value Ref Range Status   Enterococcus species NOT DETECTED NOT DETECTED Final   Listeria monocytogenes NOT DETECTED NOT DETECTED Final   Staphylococcus species NOT DETECTED NOT DETECTED Final   Staphylococcus aureus NOT DETECTED NOT DETECTED Final   Streptococcus species NOT DETECTED NOT DETECTED Final   Streptococcus agalactiae NOT DETECTED NOT DETECTED Final   Streptococcus pneumoniae NOT DETECTED NOT DETECTED Final   Streptococcus pyogenes NOT DETECTED NOT DETECTED Final   Acinetobacter baumannii NOT DETECTED NOT DETECTED Final   Enterobacteriaceae species DETECTED (A) NOT DETECTED Final    Comment: Enterobacteriaceae represent a large family of gram-negative bacteria, not a single organism. CRITICAL RESULT CALLED TO, READ BACK BY AND VERIFIED WITH: TO KAMEND(PHARMD) BY TCLEVELAND 04/21/16 AT 12:58    Enterobacter cloacae complex NOT DETECTED NOT DETECTED Final    Escherichia coli DETECTED (A) NOT DETECTED Final    Comment: CRITICAL RESULT CALLED TO, READ BACK BY AND VERIFIED WITH:  TO KAMEND(PHARMD) BY TCLEVELAND 04/21/16 AT12:58AM    Klebsiella oxytoca NOT DETECTED NOT DETECTED Final   Klebsiella pneumoniae NOT DETECTED NOT DETECTED Final   Proteus species NOT DETECTED NOT DETECTED Final   Serratia marcescens NOT DETECTED NOT DETECTED Final   Carbapenem resistance NOT DETECTED NOT DETECTED Final   Haemophilus influenzae NOT DETECTED NOT DETECTED Final   Neisseria meningitidis NOT DETECTED NOT DETECTED Final   Pseudomonas aeruginosa NOT DETECTED NOT DETECTED Final   Candida albicans NOT DETECTED NOT DETECTED Final   Candida glabrata NOT DETECTED NOT DETECTED Final   Candida krusei NOT DETECTED NOT DETECTED Final   Candida parapsilosis NOT DETECTED NOT DETECTED Final   Candida tropicalis NOT DETECTED NOT DETECTED Final  Blood Culture (routine x 2)     Status: None (Preliminary result)   Collection Time: 04/20/16 10:40 AM  Result Value Ref Range Status   Specimen Description BLOOD RIGHT ANTECUBITAL  Final   Special Requests BOTTLES DRAWN AEROBIC AND ANAEROBIC 5CC  Final   Culture  Setup Time   Final    IN BOTH AEROBIC AND ANAEROBIC BOTTLES GRAM NEGATIVE RODS CRITICAL RESULT CALLED TO, READ BACK BY AND VERIFIED WITH: TO KAMEND(PHARMd) BY TCLEVELAND 04/21/16 AT 1:07AM    Culture GRAM NEGATIVE RODS  Final   Report Status PENDING  Incomplete  MRSA PCR Screening     Status: None   Collection Time: 04/20/16  4:56 PM  Result Value Ref Range Status   MRSA by PCR NEGATIVE NEGATIVE Final  Comment:        The GeneXpert MRSA Assay (FDA approved for NASAL specimens only), is one component of a comprehensive MRSA colonization surveillance program. It is not intended to diagnose MRSA infection nor to guide or monitor treatment for MRSA infections.     RADIOLOGY STUDIES/RESULTS: Dg Chest 2 View  Result Date: 04/20/2016 CLINICAL DATA:  Pt c/o  centralized chest pain that radiates to her back since Tuesday. Pt endorse n/v and SOB. Hx of defibrillator, coronary artery disease, chronic bronchitis, chronic combined systolic and diastolic CHF, hypertension. EXAM: CHEST  2 VIEW COMPARISON:  04/17/2016 FINDINGS: Heart is mildly enlarged. No pulmonary edema. patient's left-sided transvenous pacemaker with leads to the right atrium, right ventricle, and coronary sinus. Overlying the right lung apex, there is question of a pulmonary nodule warranting further evaluation. IMPRESSION: 1. Mild cardiomegaly without pulmonary edema. 2. Question of right upper lobe pulmonary nodule. Further evaluation with CT of the chest is recommended. Electronically Signed   By: Nolon Nations M.D.   On: 04/20/2016 10:18   Dg Chest 2 View  Result Date: 04/17/2016 CLINICAL DATA:  LEFT-sided chest pain and shortness of breath since yesterday. EXAM: CHEST  2 VIEW COMPARISON:  10/13/2015. FINDINGS: Cardiomegaly. Multi lead AICD device with stable lead position, LEFT subclavian approach. No active infiltrates or failure. No effusion or pneumothorax. No osseous findings. IMPRESSION: Stable chest.  Cardiomegaly. Electronically Signed   By: Staci Righter M.D.   On: 04/17/2016 15:40   Ct Angio Chest Pe W Or Wo Contrast  Result Date: 04/20/2016 CLINICAL DATA:  Patient with chest pain and abdominal pain. Nausea and vomiting. Possible pulmonary nodule on prior chest radiograph. EXAM: CT ANGIOGRAPHY CHEST WITH CONTRAST TECHNIQUE: Multidetector CT imaging of the chest was performed using the standard protocol during bolus administration of intravenous contrast. Multiplanar CT image reconstructions and MIPs were obtained to evaluate the vascular anatomy. CONTRAST:  80 cc Isovue 370 COMPARISON:  Chest radiograph earlier same day FINDINGS: Cardiovascular: No abnormal filling defect identified within the pulmonary arterial system to suggest pulmonary embolus. Evaluation is somewhat limited due  to body habitus and motion artifact. Heart is enlarged. No pericardial effusion. Aorta and main pulmonary artery normal in caliber. Mediastinum/Nodes: No enlarged axillary, mediastinal or hilar lymphadenopathy. The esophagus is normal in appearance. Lungs/Pleura: Central airways are patent. Depending ground-glass opacities within the lower lobes bilaterally most compatible with atelectasis. No large area of pulmonary consolidation. No pleural effusion or pneumothorax. Upper Abdomen: Unremarkable. Musculoskeletal: Mid thoracic spine degenerative changes. Review of the MIP images confirms the above findings. IMPRESSION: No evidence for pulmonary embolus. Dependent atelectasis. Electronically Signed   By: Lovey Newcomer M.D.   On: 04/20/2016 14:31   Ct Abdomen Pelvis W Contrast  Result Date: 04/20/2016 CLINICAL DATA:  Pt c/o LLQ pain and N/V for the past 4 days Isovue 300 74ml pt is on metformin instructed to discontinue for 48 hrs ^170mL ISOVUE-300 IOPAMIDOL (ISOVUE-300) INJECTION 61% EXAM: CT ABDOMEN AND PELVIS WITH CONTRAST TECHNIQUE: Multidetector CT imaging of the abdomen and pelvis was performed using the standard protocol following bolus administration of intravenous contrast. CONTRAST:  164mL ISOVUE-300 IOPAMIDOL (ISOVUE-300) INJECTION 61% COMPARISON:  07/05/2012 MRI pelvis, 01/26/2012 CT the abdomen and pelvis FINDINGS: Lower chest: Pacemaker leads in the coronary sinus and right ventricle. Heart size is normal. Lung bases clear. Hepatobiliary: No focal liver abnormality is seen. No gallstones, gallbladder wall thickening, or biliary dilatation. Pancreas: Unremarkable. No pancreatic ductal dilatation or surrounding inflammatory changes. Spleen: Normal in  size without focal abnormality. Adrenals/Urinary Tract: Adrenal glands are normal. There is symmetric enhancement of the kidneys. No renal mass or evidence for urinary tract obstruction. The distal ureters and bladder are partially obscured by a metallic  artifact in the femurs. Stomach/Bowel: Stomach and small bowel loops are normal in appearance. Colon is normal in appearance. No evidence for acute appendicitis. Average stool burden. Vascular/Lymphatic: No significant vascular findings are present. No enlarged abdominal or pelvic lymph nodes. Reproductive: The uterus is enlarged.  No adnexal mass. Other: None Musculoskeletal: Bilateral total hip arthroplasty. No acute fracture. IMPRESSION: 1.  No evidence for acute  abnormality. 2. Evaluation of the pelvis degraded by metallic artifact in bilateral total hip arthroplasty. 3. Enlargement of the uterus, similar in appearance to previous MRI in 2014. 4. Transvenous pacemaker. Electronically Signed   By: Nolon Nations M.D.   On: 04/20/2016 11:22   Dg Abd Portable 1v  Result Date: 04/22/2016 CLINICAL DATA:  Abdominal pain. EXAM: PORTABLE ABDOMEN - 1 VIEW COMPARISON:  CT 2 days prior FINDINGS: No evidence of free air on supine views. No bowel dilatation to suggest obstruction. Air throughout small and large bowel, similar to recent CT. No increased stool burden to suggest constipation. Pelvic phleboliths are noted. Bilateral hip prostheses. IMPRESSION: No bowel obstruction or free air. Electronically Signed   By: Jeb Levering M.D.   On: 04/22/2016 02:19     LOS: 2 days   Oren Binet, MD  [facility] Pager:[phone]  If 7PM-7AM, please contact night-coverage [email or web address] Password [facility] 04/22/2016, 11:16 AM

## 2016-04-23 DIAGNOSIS — I480 Paroxysmal atrial fibrillation: Secondary | ICD-10-CM

## 2016-04-23 DIAGNOSIS — Z95810 Presence of automatic (implantable) cardiac defibrillator: Secondary | ICD-10-CM

## 2016-04-23 LAB — CULTURE, BLOOD (ROUTINE X 2)

## 2016-04-23 LAB — BASIC METABOLIC PANEL
Anion gap: 11 (ref 5–15)
BUN: 17 mg/dL (ref 6–20)
CO2: 24 mmol/L (ref 22–32)
Calcium: 8.6 mg/dL — ABNORMAL LOW (ref 8.9–10.3)
Chloride: 100 mmol/L — ABNORMAL LOW (ref 101–111)
Creatinine, Ser: 1.09 mg/dL — ABNORMAL HIGH (ref 0.44–1.00)
GFR calc Af Amer: >60 mL/min (ref >60)
GFR calc non Af Amer: 53 mL/min — ABNORMAL LOW (ref >60)
Glucose, Bld: 274 mg/dL — ABNORMAL HIGH (ref 65–99)
Potassium: 3.6 mmol/L (ref 3.5–5.1)
Sodium: 135 mmol/L (ref 135–145)

## 2016-04-23 LAB — GLUCOSE, CAPILLARY
Glucose-Capillary: 276 mg/dL — ABNORMAL HIGH (ref 65–99)
Glucose-Capillary: 323 mg/dL — ABNORMAL HIGH (ref 65–99)
Glucose-Capillary: 266 mg/dL — ABNORMAL HIGH (ref 65–99)

## 2016-04-23 LAB — CBC
HCT: 31.2 % — ABNORMAL LOW (ref 36.0–46.0)
Hemoglobin: 10.8 g/dL — ABNORMAL LOW (ref 12.0–15.0)
MCH: 30.2 pg (ref 26.0–34.0)
MCHC: 34.6 g/dL (ref 30.0–36.0)
MCV: 87.2 fL (ref 78.0–100.0)
Platelets: 390 10*3/uL (ref 150–400)
RBC: 3.58 MIL/uL — ABNORMAL LOW (ref 3.87–5.11)
RDW: 12.7 % (ref 11.5–15.5)
WBC: 17.9 10*3/uL — ABNORMAL HIGH (ref 4.0–10.5)

## 2016-04-23 LAB — HEMOGLOBIN A1C
Hgb A1c MFr Bld: >15.5 % — ABNORMAL HIGH (ref 4.8–5.6)
Mean Plasma Glucose: >398 mg/dL

## 2016-04-23 LAB — MAGNESIUM: Magnesium: 2.5 mg/dL — ABNORMAL HIGH (ref 1.7–2.4)

## 2016-04-23 MED ORDER — INSULIN STARTER KIT- PEN NEEDLES (ENGLISH)
1.0000 | Freq: Once | Status: AC
  Administered 2016-04-23: 1
  Filled 2016-04-23: qty 1

## 2016-04-23 MED ORDER — AMIODARONE IV BOLUS ONLY 150 MG/100ML
150.0000 mg | Freq: Once | INTRAVENOUS | Status: AC
  Administered 2016-04-23: 150 mg via INTRAVENOUS
  Filled 2016-04-23: qty 100

## 2016-04-23 MED ORDER — MAGNESIUM SULFATE 2 GM/50ML IV SOLN
2.0000 g | Freq: Once | INTRAVENOUS | Status: AC
  Administered 2016-04-23: 2 g via INTRAVENOUS
  Filled 2016-04-23: qty 50

## 2016-04-23 MED ORDER — LIVING WELL WITH DIABETES BOOK
Freq: Once | Status: AC
  Administered 2016-04-23: 13:00:00
  Filled 2016-04-23: qty 1

## 2016-04-23 MED ORDER — APIXABAN 5 MG PO TABS
5.0000 mg | ORAL_TABLET | Freq: Two times a day (BID) | ORAL | Status: DC
  Administered 2016-04-23 – 2016-04-25 (×5): 5 mg via ORAL
  Filled 2016-04-23 (×5): qty 1

## 2016-04-23 MED ORDER — INSULIN GLARGINE 100 UNIT/ML ~~LOC~~ SOLN
42.0000 [IU] | Freq: Every day | SUBCUTANEOUS | Status: DC
  Administered 2016-04-23 – 2016-04-24 (×2): 42 [IU] via SUBCUTANEOUS
  Filled 2016-04-23 (×2): qty 0.42

## 2016-04-23 MED ORDER — CARVEDILOL 3.125 MG PO TABS
3.1250 mg | ORAL_TABLET | Freq: Two times a day (BID) | ORAL | Status: DC
  Administered 2016-04-23 – 2016-04-25 (×4): 3.125 mg via ORAL
  Filled 2016-04-23 (×3): qty 1

## 2016-04-23 MED ORDER — POTASSIUM CHLORIDE CRYS ER 20 MEQ PO TBCR
40.0000 meq | EXTENDED_RELEASE_TABLET | Freq: Once | ORAL | Status: AC
  Administered 2016-04-23: 40 meq via ORAL
  Filled 2016-04-23: qty 2

## 2016-04-23 MED ORDER — ZOLPIDEM TARTRATE 5 MG PO TABS
5.0000 mg | ORAL_TABLET | Freq: Once | ORAL | Status: AC
  Administered 2016-04-23: 5 mg via ORAL
  Filled 2016-04-23: qty 1

## 2016-04-23 MED ORDER — AMIODARONE HCL IN DEXTROSE 360-4.14 MG/200ML-% IV SOLN
60.0000 mg/h | INTRAVENOUS | Status: AC
  Administered 2016-04-23: 60 mg/h via INTRAVENOUS
  Filled 2016-04-23: qty 200

## 2016-04-23 MED ORDER — CARVEDILOL 3.125 MG PO TABS
3.1250 mg | ORAL_TABLET | Freq: Two times a day (BID) | ORAL | Status: DC
  Filled 2016-04-23: qty 1

## 2016-04-23 MED ORDER — AMIODARONE HCL IN DEXTROSE 360-4.14 MG/200ML-% IV SOLN
30.0000 mg/h | INTRAVENOUS | Status: DC
  Administered 2016-04-23: 30 mg/h via INTRAVENOUS
  Filled 2016-04-23: qty 200

## 2016-04-23 MED ORDER — SACUBITRIL-VALSARTAN 24-26 MG PO TABS
1.0000 | ORAL_TABLET | Freq: Two times a day (BID) | ORAL | Status: DC
  Administered 2016-04-23 – 2016-04-25 (×5): 1 via ORAL
  Filled 2016-04-23 (×5): qty 1

## 2016-04-23 NOTE — Progress Notes (Signed)
Pt continues to have symptoms with a wide complex tachycardia.   EKG and tele reviewed with MD and EP NP.  Unclear if Afib vs VT at this time.  Was in NSR last night and this am.    Will give bolus of amiodarone and start infusion.  Pt has had one dose of Eliquis.   Will interrogate device once ICD rep is out of EP case.   Legrand [address]o [address]" Lacey, PA-C 04/23/2016 4:06 PM

## 2016-04-23 NOTE — Progress Notes (Signed)
[place] [facility] called regarding pt HR tachy up to 160's with PVC. Telemetry reviewed. Assessed pt, pt anxious and holding hand to chest saying "I just need to calm myself down".  [place] with CHF team along with Dr. Haroldine Laws on unit and reviewed telemetry. New orders given. Will continue to monitor.

## 2016-04-23 NOTE — Progress Notes (Signed)
Pt. HR up while moving around in bed. Pt. Asymptomatic. VSS. HR now stable. On call for [facility], K. Schorr, made aware via text page. RN will continue to monitor.

## 2016-04-23 NOTE — Progress Notes (Signed)
PROGRESS NOTE        PATIENT DETAILS Name: [name] Age: 66 y.o. Sex: female Date of Birth: [birth year] Admit Date: 04/20/2016 Admitting Physician Maren Reamer, MD [id number], Gwynneth Munson, MD  Brief Narrative: Patient is a 66 y.o. female with combined chronic systolic/diastolic heart failure, ICD placement, diabetes, hypertension presented to the ED on 3/25 with septic shock secondary to gram-negative bacteremia. Managed initially by [facility], transferred to the [facility] on 3/27.  Subjective: No chest pain or shortness of breath.  Assessment/Plan: Septic shock: Secondary to Escherichia coli UTI and Escherichia coli bacteremia. Sepsis pathophysiology has resolved, she is clinically improved. Leukocytosis slowly improving.  Escherichia coli UTI with bacteremia: Continue Rocephin, await final cultures. She is clinically improved with resolution of septic shock . Will transition to oral agents when blood c/s results are back.  Acute kidney injury: Hemodynamically mediated in a setting of septic shock. Resolviing with supportive measures.  Type 2 diabetes with hyperglycemia: CBGs much better-but still not optimal, increase Lantus to 42 units, continue NovoLog 8 units with meals. Follow and adjust accordingly.  Acute on chronic combined systolic and diastolic CHF (EF 37-90% on TTE 04/21/16): EF significantly reduced from prior-etiology thought to be either secondary to sepsis or due to noncompliance. Since hemodynamically stable, she has been started on Lasix, entresto. Cardiology following.  Paroxysmal atrial fibrillation: Currently not on any rate control medications, on Eliquis. CHA2DSVAS2C score of atleast 4.  Hypertension: Better controlled-continue Entresto, Bidil-follow  Minimally elevated troponin: Trend is flat and not consistent with ACS. LHC in 2014 with minimal CAD.   DVT Prophylaxis: Full dose anticoagulation with Eliquis  Code  Status: Full code   Family Communication: None at bedside  Disposition Plan: Remain inpatient-home likely 3/29  Antimicrobial agents: Anti-infectives    Start     Dose/Rate Route Frequency Ordered Stop   04/22/16 1500  cefTRIAXone (ROCEPHIN) 2 g in dextrose 5 % 50 mL IVPB     2 g 100 mL/hr over 30 Minutes Intravenous Every 24 hours 04/22/16 1429     04/21/16 1200  cefTRIAXone (ROCEPHIN) 1 g in dextrose 5 % 50 mL IVPB  Status:  Discontinued     1 g 100 mL/hr over 30 Minutes Intravenous Every 24 hours 04/20/16 1023 04/20/16 1815   04/20/16 1830  vancomycin (VANCOCIN) IVPB 750 mg/150 ml premix  Status:  Discontinued     750 mg 150 mL/hr over 60 Minutes Intravenous Every 12 hours 04/20/16 1822 04/21/16 0918   04/20/16 1830  ceFEPIme (MAXIPIME) 1 g in dextrose 5 % 50 mL IVPB  Status:  Discontinued     1 g 100 mL/hr over 30 Minutes Intravenous Every 24 hours 04/20/16 1822 04/22/16 1429   04/20/16 1030  cefTRIAXone (ROCEPHIN) 2 g in dextrose 5 % 50 mL IVPB     2 g 100 mL/hr over 30 Minutes Intravenous  Once 04/20/16 1022 04/20/16 1256      Procedures: Echo 3/26>> - Left ventricle: The cavity size was normal. There was moderate   concentric hypertrophy. Systolic function was moderately to   severely reduced. The estimated ejection fraction was in the   range of 30% to 35%. Global hypokinesis worse in the   anteroseptal, inferior, and inferoseptal myocardium. Doppler   parameters are consistent with abnormal left ventricular   relaxation (grade 1 diastolic dysfunction). Doppler parameters  are consistent with high ventricular filling pressure. - Aortic valve: Transvalvular velocity was within the normal range.   There was no stenosis. There was no regurgitation. - Mitral valve: Transvalvular velocity was within the normal range.   There was no evidence for stenosis. There was no regurgitation. - Right ventricle: The cavity size was normal. Wall thickness was   normal. Systolic  function was normal. - Right atrium: The atrium was mildly dilated. - Tricuspid valve: There was mild regurgitation. - Pulmonary arteries: Systolic pressure was within the normal   range. PA peak pressure: 26 mm Hg (S). - Pericardium, extracardiac: A small pericardial effusion was   identified. - Global longitudinal strain -12.5%, which is abnormal.  CONSULTS:  cardiology and pulmonary/intensive care  Time spent: 25- minutes-Greater than 50% of this time was spent in counseling, explanation of diagnosis, planning of further management, and coordination of care.  MEDICATIONS: Scheduled Meds: . apixaban  5 mg Oral BID  . atorvastatin  40 mg Oral Daily  . cefTRIAXone (ROCEPHIN)  IV  2 g Intravenous Q24H  . furosemide  40 mg Oral BID  . insulin aspart  0-20 Units Subcutaneous TID WC  . insulin aspart  0-5 Units Subcutaneous QHS  . insulin aspart  10 Units Subcutaneous Once  . insulin aspart  8 Units Subcutaneous TID WC  . insulin glargine  42 Units Subcutaneous Daily  . isosorbide-hydrALAZINE  1 tablet Oral TID  . mouth rinse  15 mL Mouth Rinse BID  . polyethylene glycol  17 g Oral Daily  . potassium chloride  20 mEq Oral BID  . sacubitril-valsartan  1 tablet Oral BID  . sodium chloride  500 mL Intravenous Once   Continuous Infusions: PRN Meds:.acetaminophen **OR** acetaminophen, albuterol, ondansetron **OR** ondansetron (ZOFRAN) IV, promethazine, traMADol   PHYSICAL EXAM: Vital signs: Vitals:   04/22/16 1949 04/23/16 0031 04/23/16 0549 04/23/16 0900  BP: 126/79 119/73 (!) 154/68 139/76  Pulse: (!) 104 62 88 99  Resp: 20 20 20 18   Temp: 98.6 F (37 C) 98.6 F (37 C) 98.6 F (37 C) 98.7 F (37.1 C)  TempSrc: Oral Oral Oral Oral  SpO2: 98% 96% 97% 99%  Weight:   94.8 kg (208 lb 14.4 oz)   Height:       Filed Weights   04/21/16 2054 04/22/16 0414 04/23/16 0549  Weight: 97.6 kg (215 lb 3.2 oz) 96.3 kg (212 lb 3.2 oz) 94.8 kg (208 lb 14.4 oz)   Body mass index is  32.72 kg/m.   General appearance :Awake, alert, not in any distress. Speech Clear.  Eyes:, pupils equally reactive to light and accomodation,no scleral icterus. HEENT: Atraumatic and Normocephalic Neck: supple, no JVD. No cervical lymphadenopathy. Resp:Good air entry bilaterally, no rales CVS: S1 S2 regular GI: Bowel sounds present, Non tender and not distended with no gaurding, rigidity or rebound. Extremities: B/L Lower Ext shows no edema, both legs are warm to touch Neurology:  speech clear,Non focal, sensation is grossly intact. Psychiatric: Normal judgment and insight. Alert and oriented x 3. Normal mood. Musculoskeletal:No digital cyanosis Skin:No Rash, warm and dry Wounds:N/A  I have personally reviewed following labs and imaging studies  LABORATORY DATA: CBC:  Recent Labs Lab 04/20/16 0928 04/20/16 1425 04/21/16 0207 04/22/16 0215 04/23/16 0540  WBC 24.9* 22.9* 26.6* 22.2* 17.9*  NEUTROABS 21.2*  --   --   --   --   HGB 12.3 10.4* 11.1* 11.0* 10.8*  HCT 35.7* 30.5* 32.0* 32.1* 31.2*  MCV  90.2 88.7 89.6 88.7 87.2  PLT 364 366 299 334 026    Basic Metabolic Panel:  Recent Labs Lab 04/20/16 0928 04/20/16 1425 04/20/16 1917 04/21/16 0207 04/22/16 0215 04/23/16 0540  NA 126*  --  130* 135 134* 135  K 4.1  --  3.3* 3.9 3.2* 3.6  CL 88*  --  92* 98* 99* 100*  CO2 24  --  25 25 21* 24  GLUCOSE 727*  --  526* 361* 202* 274*  BUN 20  --  21* 26* 24* 17  CREATININE 1.42* 1.47* 1.68* 2.09* 1.47* 1.09*  CALCIUM 8.7*  --  8.2* 8.0* 8.1* 8.6*  MG  --   --   --   --  1.8  --   PHOS  --   --   --   --  2.2*  --     GFR: Estimated Creatinine Clearance: 62.5 mL/min (A) (by C-G formula based on SCr of 1.09 mg/dL (H)).  Liver Function Tests:  Recent Labs Lab 04/20/16 0928 04/22/16 0215  AST 21 43*  ALT 14 24  ALKPHOS 119 122  BILITOT 0.5 0.7  PROT 7.5 6.7  ALBUMIN 3.0* 2.4*    Recent Labs Lab 04/20/16 0928 04/22/16 0215  LIPASE <10* 11  AMYLASE  --   58   No results for input(s): AMMONIA in the last 168 hours.  Coagulation Profile:  Recent Labs Lab 04/20/16 1425  INR 1.18    Cardiac Enzymes:  Recent Labs Lab 04/20/16 0928 04/20/16 1425 04/22/16 0215  CKTOTAL  --   --  43  CKMB  --   --  6.1*  TROPONINI 0.10* 0.12* 0.06*    BNP (last 3 results) No results for input(s): PROBNP in the last 8760 hours.  HbA1C: No results for input(s): HGBA1C in the last 72 hours.  CBG:  Recent Labs Lab 04/22/16 1249 04/22/16 1454 04/22/16 1643 04/22/16 2127 04/23/16 0720  GLUCAP 361* 372* 330* 197* 276*    Lipid Profile: No results for input(s): CHOL, HDL, LDLCALC, TRIG, CHOLHDL, LDLDIRECT in the last 72 hours.  Thyroid Function Tests: No results for input(s): TSH, T4TOTAL, FREET4, T3FREE, THYROIDAB in the last 72 hours.  Anemia Panel: No results for input(s): VITAMINB12, FOLATE, FERRITIN, TIBC, IRON, RETICCTPCT in the last 72 hours.  Urine analysis:    Component Value Date/Time   COLORURINE YELLOW 04/20/2016 0935   APPEARANCEUR CLOUDY (A) 04/20/2016 0935   LABSPEC 1.018 04/20/2016 0935   PHURINE 5.0 04/20/2016 0935   GLUCOSEU >=500 (A) 04/20/2016 0935   HGBUR SMALL (A) 04/20/2016 0935   BILIRUBINUR NEGATIVE 04/20/2016 0935   KETONESUR NEGATIVE 04/20/2016 0935   PROTEINUR 30 (A) 04/20/2016 0935   UROBILINOGEN 0.2 10/04/2014 1450   NITRITE NEGATIVE 04/20/2016 0935   LEUKOCYTESUR MODERATE (A) 04/20/2016 0935    Sepsis Labs: Lactic Acid, Venous    Component Value Date/Time   LATICACIDVEN 1.0 04/22/2016 0215    MICROBIOLOGY: Recent Results (from the past 240 hour(s))  Urine culture     Status: Abnormal   Collection Time: 04/20/16  9:35 AM  Result Value Ref Range Status   Specimen Description URINE, RANDOM  Final   Special Requests NONE  Final   Culture >=100,000 COLONIES/mL ESCHERICHIA COLI (A)  Final   Report Status 04/22/2016 FINAL  Final   Organism ID, Bacteria ESCHERICHIA COLI (A)  Final       Susceptibility   Escherichia coli - MIC*    AMPICILLIN >=32 RESISTANT Resistant  CEFAZOLIN <=4 SENSITIVE Sensitive     CEFTRIAXONE <=1 SENSITIVE Sensitive     CIPROFLOXACIN <=0.25 SENSITIVE Sensitive     GENTAMICIN <=1 SENSITIVE Sensitive     IMIPENEM <=0.25 SENSITIVE Sensitive     NITROFURANTOIN <=16 SENSITIVE Sensitive     TRIMETH/SULFA <=20 SENSITIVE Sensitive     AMPICILLIN/SULBACTAM 16 INTERMEDIATE Intermediate     PIP/TAZO <=4 SENSITIVE Sensitive     Extended ESBL NEGATIVE Sensitive     * >=100,000 COLONIES/mL ESCHERICHIA COLI  Blood Culture (routine x 2)     Status: Abnormal   Collection Time: 04/20/16 10:35 AM  Result Value Ref Range Status   Specimen Description BLOOD LEFT FOREARM  Final   Special Requests BOTTLES DRAWN AEROBIC AND ANAEROBIC 5CC  Final   Culture  Setup Time   Final    IN BOTH AEROBIC AND ANAEROBIC BOTTLES GRAM NEGATIVE RODS CRITICAL RESULT CALLED TO, READ BACK BY AND VERIFIED WITH: KAMEND(PHARMd) BY TCLEVELAND 04/21/16 AT 12:58AM    Culture ESCHERICHIA COLI (A)  Final   Report Status 04/23/2016 FINAL  Final   Organism ID, Bacteria ESCHERICHIA COLI  Final      Susceptibility   Escherichia coli - MIC*    AMPICILLIN >=32 RESISTANT Resistant     CEFAZOLIN <=4 SENSITIVE Sensitive     CEFEPIME <=1 SENSITIVE Sensitive     CEFTAZIDIME <=1 SENSITIVE Sensitive     CEFTRIAXONE <=1 SENSITIVE Sensitive     CIPROFLOXACIN <=0.25 SENSITIVE Sensitive     GENTAMICIN <=1 SENSITIVE Sensitive     IMIPENEM <=0.25 SENSITIVE Sensitive     TRIMETH/SULFA <=20 SENSITIVE Sensitive     AMPICILLIN/SULBACTAM 16 INTERMEDIATE Intermediate     PIP/TAZO <=4 SENSITIVE Sensitive     Extended ESBL NEGATIVE Sensitive     * ESCHERICHIA COLI  Blood Culture ID Panel (Reflexed)     Status: Abnormal   Collection Time: 04/20/16 10:35 AM  Result Value Ref Range Status   Enterococcus species NOT DETECTED NOT DETECTED Final   Listeria monocytogenes NOT DETECTED NOT DETECTED Final    Staphylococcus species NOT DETECTED NOT DETECTED Final   Staphylococcus aureus NOT DETECTED NOT DETECTED Final   Streptococcus species NOT DETECTED NOT DETECTED Final   Streptococcus agalactiae NOT DETECTED NOT DETECTED Final   Streptococcus pneumoniae NOT DETECTED NOT DETECTED Final   Streptococcus pyogenes NOT DETECTED NOT DETECTED Final   Acinetobacter baumannii NOT DETECTED NOT DETECTED Final   Enterobacteriaceae species DETECTED (A) NOT DETECTED Final    Comment: Enterobacteriaceae represent a large family of gram-negative bacteria, not a single organism. CRITICAL RESULT CALLED TO, READ BACK BY AND VERIFIED WITH: TO KAMEND(PHARMD) BY TCLEVELAND 04/21/16 AT 12:58    Enterobacter cloacae complex NOT DETECTED NOT DETECTED Final   Escherichia coli DETECTED (A) NOT DETECTED Final    Comment: CRITICAL RESULT CALLED TO, READ BACK BY AND VERIFIED WITH:  TO KAMEND(PHARMD) BY TCLEVELAND 04/21/16 AT12:58AM    Klebsiella oxytoca NOT DETECTED NOT DETECTED Final   Klebsiella pneumoniae NOT DETECTED NOT DETECTED Final   Proteus species NOT DETECTED NOT DETECTED Final   Serratia marcescens NOT DETECTED NOT DETECTED Final   Carbapenem resistance NOT DETECTED NOT DETECTED Final   Haemophilus influenzae NOT DETECTED NOT DETECTED Final   Neisseria meningitidis NOT DETECTED NOT DETECTED Final   Pseudomonas aeruginosa NOT DETECTED NOT DETECTED Final   Candida albicans NOT DETECTED NOT DETECTED Final   Candida glabrata NOT DETECTED NOT DETECTED Final   Candida krusei NOT DETECTED NOT DETECTED Final  Candida parapsilosis NOT DETECTED NOT DETECTED Final   Candida tropicalis NOT DETECTED NOT DETECTED Final  Blood Culture (routine x 2)     Status: Abnormal   Collection Time: 04/20/16 10:40 AM  Result Value Ref Range Status   Specimen Description BLOOD RIGHT ANTECUBITAL  Final   Special Requests BOTTLES DRAWN AEROBIC AND ANAEROBIC 5CC  Final   Culture  Setup Time   Final    IN BOTH AEROBIC AND ANAEROBIC  BOTTLES GRAM NEGATIVE RODS CRITICAL RESULT CALLED TO, READ BACK BY AND VERIFIED WITH: TO KAMEND(PHARMd) BY TCLEVELAND 04/21/16 AT 1:07AM    Culture (A)  Final    ESCHERICHIA COLI SUSCEPTIBILITIES PERFORMED ON PREVIOUS CULTURE WITHIN THE LAST 5 DAYS.    Report Status 04/23/2016 FINAL  Final  MRSA PCR Screening     Status: None   Collection Time: 04/20/16  4:56 PM  Result Value Ref Range Status   MRSA by PCR NEGATIVE NEGATIVE Final    Comment:        The GeneXpert MRSA Assay (FDA approved for NASAL specimens only), is one component of a comprehensive MRSA colonization surveillance program. It is not intended to diagnose MRSA infection nor to guide or monitor treatment for MRSA infections.     RADIOLOGY STUDIES/RESULTS: Dg Chest 2 View  Result Date: 04/20/2016 CLINICAL DATA:  Pt c/o centralized chest pain that radiates to her back since Tuesday. Pt endorse n/v and SOB. Hx of defibrillator, coronary artery disease, chronic bronchitis, chronic combined systolic and diastolic CHF, hypertension. EXAM: CHEST  2 VIEW COMPARISON:  04/17/2016 FINDINGS: Heart is mildly enlarged. No pulmonary edema. patient's left-sided transvenous pacemaker with leads to the right atrium, right ventricle, and coronary sinus. Overlying the right lung apex, there is question of a pulmonary nodule warranting further evaluation. IMPRESSION: 1. Mild cardiomegaly without pulmonary edema. 2. Question of right upper lobe pulmonary nodule. Further evaluation with CT of the chest is recommended. Electronically Signed   By: Nolon Nations M.D.   On: 04/20/2016 10:18   Dg Chest 2 View  Result Date: 04/17/2016 CLINICAL DATA:  LEFT-sided chest pain and shortness of breath since yesterday. EXAM: CHEST  2 VIEW COMPARISON:  10/13/2015. FINDINGS: Cardiomegaly. Multi lead AICD device with stable lead position, LEFT subclavian approach. No active infiltrates or failure. No effusion or pneumothorax. No osseous findings. IMPRESSION:  Stable chest.  Cardiomegaly. Electronically Signed   By: Staci Righter M.D.   On: 04/17/2016 15:40   Ct Angio Chest Pe W Or Wo Contrast  Result Date: 04/20/2016 CLINICAL DATA:  Patient with chest pain and abdominal pain. Nausea and vomiting. Possible pulmonary nodule on prior chest radiograph. EXAM: CT ANGIOGRAPHY CHEST WITH CONTRAST TECHNIQUE: Multidetector CT imaging of the chest was performed using the standard protocol during bolus administration of intravenous contrast. Multiplanar CT image reconstructions and MIPs were obtained to evaluate the vascular anatomy. CONTRAST:  80 cc Isovue 370 COMPARISON:  Chest radiograph earlier same day FINDINGS: Cardiovascular: No abnormal filling defect identified within the pulmonary arterial system to suggest pulmonary embolus. Evaluation is somewhat limited due to body habitus and motion artifact. Heart is enlarged. No pericardial effusion. Aorta and main pulmonary artery normal in caliber. Mediastinum/Nodes: No enlarged axillary, mediastinal or hilar lymphadenopathy. The esophagus is normal in appearance. Lungs/Pleura: Central airways are patent. Depending ground-glass opacities within the lower lobes bilaterally most compatible with atelectasis. No large area of pulmonary consolidation. No pleural effusion or pneumothorax. Upper Abdomen: Unremarkable. Musculoskeletal: Mid thoracic spine degenerative changes. Review of the  MIP images confirms the above findings. IMPRESSION: No evidence for pulmonary embolus. Dependent atelectasis. Electronically Signed   By: Lovey Newcomer M.D.   On: 04/20/2016 14:31   Ct Abdomen Pelvis W Contrast  Result Date: 04/20/2016 CLINICAL DATA:  Pt c/o LLQ pain and N/V for the past 4 days Isovue 300 71ml pt is on metformin instructed to discontinue for 48 hrs ^133mL ISOVUE-300 IOPAMIDOL (ISOVUE-300) INJECTION 61% EXAM: CT ABDOMEN AND PELVIS WITH CONTRAST TECHNIQUE: Multidetector CT imaging of the abdomen and pelvis was performed using the  standard protocol following bolus administration of intravenous contrast. CONTRAST:  130mL ISOVUE-300 IOPAMIDOL (ISOVUE-300) INJECTION 61% COMPARISON:  07/05/2012 MRI pelvis, 01/26/2012 CT the abdomen and pelvis FINDINGS: Lower chest: Pacemaker leads in the coronary sinus and right ventricle. Heart size is normal. Lung bases clear. Hepatobiliary: No focal liver abnormality is seen. No gallstones, gallbladder wall thickening, or biliary dilatation. Pancreas: Unremarkable. No pancreatic ductal dilatation or surrounding inflammatory changes. Spleen: Normal in size without focal abnormality. Adrenals/Urinary Tract: Adrenal glands are normal. There is symmetric enhancement of the kidneys. No renal mass or evidence for urinary tract obstruction. The distal ureters and bladder are partially obscured by a metallic artifact in the femurs. Stomach/Bowel: Stomach and small bowel loops are normal in appearance. Colon is normal in appearance. No evidence for acute appendicitis. Average stool burden. Vascular/Lymphatic: No significant vascular findings are present. No enlarged abdominal or pelvic lymph nodes. Reproductive: The uterus is enlarged.  No adnexal mass. Other: None Musculoskeletal: Bilateral total hip arthroplasty. No acute fracture. IMPRESSION: 1.  No evidence for acute  abnormality. 2. Evaluation of the pelvis degraded by metallic artifact in bilateral total hip arthroplasty. 3. Enlargement of the uterus, similar in appearance to previous MRI in 2014. 4. Transvenous pacemaker. Electronically Signed   By: Nolon Nations M.D.   On: 04/20/2016 11:22   Dg Abd Portable 1v  Result Date: 04/22/2016 CLINICAL DATA:  Abdominal pain. EXAM: PORTABLE ABDOMEN - 1 VIEW COMPARISON:  CT 2 days prior FINDINGS: No evidence of free air on supine views. No bowel dilatation to suggest obstruction. Air throughout small and large bowel, similar to recent CT. No increased stool burden to suggest constipation. Pelvic phleboliths are  noted. Bilateral hip prostheses. IMPRESSION: No bowel obstruction or free air. Electronically Signed   By: Jeb Levering M.D.   On: 04/22/2016 02:19     LOS: 3 days   Oren Binet, MD  [facility] Pager:[phone]  If 7PM-7AM, please contact night-coverage [email or web address] Password [email or web address] 04/23/2016, 10:00 AM

## 2016-04-23 NOTE — Progress Notes (Signed)
Pt educated about safety and importance of bed alarm during the night however pt refuses to be on bed alarm. Will continue to round on patient.   Ayaz Sondgeroth, RN    

## 2016-04-23 NOTE — Progress Notes (Addendum)
Ambien 5 mg ordered.   Daryle Boyington, RN

## 2016-04-23 NOTE — Progress Notes (Signed)
Patient ID: [name], female   DOB: 25-Sep-1950, 66 y.o.   MRN: [id number]     Advanced Heart Failure Rounding Note  PCP: [name], MD Primary Cardiologist: Dr. McLean/[name]   Subjective:    Admitted on 04/20/16 with urosepsis with E. Coli bacteremia, was hypotensive and febrile. Now on ceftriaxone.  EF reduced from 45-50% ->30-35% in setting of medication noncompliance.  Lasix restarted yesterday, says that her breathing is doing ok.  Overall feels ok.     Creatinine 1.42->1.47->1.68->2.09->1.47 -> 1.09.   Objective:   Weight Range: 208 lb 14.4 oz (94.8 kg) Body mass index is 32.72 kg/m.   Vital Signs:   Temp:  [98.6 F (37 C)-98.9 F (37.2 C)] 98.6 F (37 C) (03/28 0549) Pulse Rate:  [62-104] 88 (03/28 0549) Resp:  [14-20] 20 (03/28 0549) BP: (119-154)/(56-86) 154/68 (03/28 0549) SpO2:  [96 %-98 %] 97 % (03/28 0549) Weight:  [208 lb 14.4 oz (94.8 kg)] 208 lb 14.4 oz (94.8 kg) (03/28 0549) Last BM Date: 04/21/16  Weight change: Filed Weights   04/21/16 2054 04/22/16 0414 04/23/16 0549  Weight: 215 lb 3.2 oz (97.6 kg) 212 lb 3.2 oz (96.3 kg) 208 lb 14.4 oz (94.8 kg)    Intake/Output:   Intake/Output Summary (Last 24 hours) at 04/23/16 0811 Last data filed at 04/23/16 0553  Gross per 24 hour  Intake             1695 ml  Output             3500 ml  Net            -1805 ml     Physical Exam: General: NAD HEENT: normal Neck: supple. JVP 7. Carotids 2+ bilat; no bruits. No lymphadenopathy or thyromegaly appreciated. Cor: PMI nondisplaced. Regular rate & rhythm. No rubs, gallops or murmurs. Lungs: clear bilaterally.  Abdomen: soft, nontender, nondistended. No hepatosplenomegaly. No bruits or masses. Good bowel sounds. Extremities: no cyanosis, clubbing, rash, no edema Neuro: alert & orientedx3, cranial nerves grossly intact. moves all 4 extremities w/o difficulty. Affect pleasant   Telemetry: V paced, personally reviewed.    Labs: CBC  Recent Labs  04/20/16 0928  04/22/16 0215 04/23/16 0540  WBC 24.9*  < > 22.2* 17.9*  NEUTROABS 21.2*  --   --   --   HGB 12.3  < > 11.0* 10.8*  HCT 35.7*  < > 32.1* 31.2*  MCV 90.2  < > 88.7 87.2  PLT 364  < > 334 390  < > = values in this interval not displayed. Basic Metabolic Panel  Recent Labs  04/22/16 0215 04/23/16 0540  NA 134* 135  K 3.2* 3.6  CL 99* 100*  CO2 21* 24  GLUCOSE 202* 274*  BUN 24* 17  CREATININE 1.47* 1.09*  CALCIUM 8.1* 8.6*  MG 1.8  --   PHOS 2.2*  --    Liver Function Tests  Recent Labs  04/20/16 0928 04/22/16 0215  AST 21 43*  ALT 14 24  ALKPHOS 119 122  BILITOT 0.5 0.7  PROT 7.5 6.7  ALBUMIN 3.0* 2.4*    Recent Labs  04/20/16 0928 04/22/16 0215  LIPASE <10* 11  AMYLASE  --  58   Cardiac Enzymes  Recent Labs  04/20/16 0928 04/20/16 1425 04/22/16 0215  CKTOTAL  --   --  43  CKMB  --   --  6.1*  TROPONINI 0.10* 0.12* 0.06*    BNP: BNP (last 3  results)  Recent Labs  07/19/15 0710 08/15/15 1045 04/20/16 0928  BNP 497.8* 39.0 199.5*   Imaging/Studies:  No results found.  Transthoracic Echocardiography 04/21/16 Study Conclusions  - Left ventricle: The cavity size was normal. There was moderate   concentric hypertrophy. Systolic function was moderately to   severely reduced. The estimated ejection fraction was in the   range of 30% to 35%. Global hypokinesis worse in the   anteroseptal, inferior, and inferoseptal myocardium. Doppler   parameters are consistent with abnormal left ventricular   relaxation (grade 1 diastolic dysfunction). Doppler parameters   are consistent with high ventricular filling pressure. - Aortic valve: Transvalvular velocity was within the normal range.   There was no stenosis. There was no regurgitation. - Mitral valve: Transvalvular velocity was within the normal range.   There was no evidence for stenosis. There was no regurgitation. - Right ventricle: The cavity  size was normal. Wall thickness was   normal. Systolic function was normal. - Right atrium: The atrium was mildly dilated. - Tricuspid valve: There was mild regurgitation. - Pulmonary arteries: Systolic pressure was within the normal   range. PA peak pressure: 26 mm Hg (S). - Pericardium, extracardiac: A small pericardial effusion was   identified. - Global longitudinal strain -12.5%, which is abnormal.    Medications:     Scheduled Medications: . apixaban  5 mg Oral BID  . atorvastatin  40 mg Oral Daily  . cefTRIAXone (ROCEPHIN)  IV  2 g Intravenous Q24H  . furosemide  40 mg Oral BID  . insulin aspart  0-20 Units Subcutaneous TID WC  . insulin aspart  0-5 Units Subcutaneous QHS  . insulin aspart  10 Units Subcutaneous Once  . insulin aspart  8 Units Subcutaneous TID WC  . insulin glargine  42 Units Subcutaneous Daily  . isosorbide-hydrALAZINE  1 tablet Oral TID  . mouth rinse  15 mL Mouth Rinse BID  . polyethylene glycol  17 g Oral Daily  . potassium chloride  20 mEq Oral BID  . sacubitril-valsartan  1 tablet Oral BID  . sodium chloride  500 mL Intravenous Once    Infusions:   PRN Medications: acetaminophen **OR** acetaminophen, albuterol, ondansetron **OR** ondansetron (ZOFRAN) IV, promethazine, traMADol   Assessment/Plan    1. Acute on chronic combined systolic and diastolic CHF: Nonischemic cardiomyopathy, MDT CRT-D.  EF 30-35%, down from 45-50% in 08/2015 in the setting of sepsis and questionable compliance with her medications.  She looks euvolemic today and is not dyspneic.  BP ok. - Continue Bidil 1 tab tid.  - Continue home Lasix + KCl. - Will start Entresto 24/26 bid today.  - If BP remains stable, add low dose Coreg tomorrow.  2. Urosepsis with bacteremia: Urine culture with E. Coli and blood cultures growing E. Coli.  - Continue ceftriaxone per primary team.  3. PAF: has Eliquis listed as a home med, started it in June 2016 after PAF was noted while she  was hospitalized in June 2016.  - Restart Eliquis, had been off at home for some time.  - This patients CHA2DS2-VASc Score and unadjusted Ischemic Stroke Rate (% per year) is equal to 4.8 % stroke rate/year from a score of 4 Above score calculated as 1 point each if present [CHF, HTN, DM, Vascular=MI/PAD/Aortic Plaque, Age if 65-74, or Female], 2 points each if present [Age > 75, or Stroke/TIA/TE]  4. HTN: BP improved, adding back her home meds.  5. Elevated troponin: Not representative of  ACS, cath in 2014 with minimal CAD. Suspect demand ischemia with sepsis/hypotension.  6. AKI: In the setting of UTI, hypotension and sepsis. Resolved.   Would monitor today in hospital to make sure BP tolerates restarting meds, would like to get her back on Coreg tomorrow prior to discharge.   Length of Stay: 3  [name], MD  04/23/2016, 8:11 AM Advanced Heart Failure Team  Pager [phone] M-F 7am-4pm.  Please contact [facility] for night-coverage after hours (4p -7a ) and weekends on [email or web address]

## 2016-04-23 NOTE — Progress Notes (Signed)
Inpatient Diabetes Program Recommendations  AACE/ADA: New Consensus Statement on Inpatient Glycemic Control (2015)  Target Ranges:  Prepandial:   less than 140 mg/dL      Peak postprandial:   less than 180 mg/dL (1-2 hours)      Critically ill patients:  140 - 180 mg/dL   Lab Results  Component Value Date   GLUCAP 323 (H) 04/23/2016   HGBA1C >15.5 (H) 04/22/2016    Review of Glycemic Control Results for [name], [name] (MRN [id number]) as of 04/23/2016 13:27  Ref. Range 04/22/2016 14:54 04/22/2016 16:43 04/22/2016 21:27 04/23/2016 07:20 04/23/2016 11:32  Glucose-Capillary Latest Ref Range: 65 - 99 mg/dL 372 (H) 330 (H) 197 (H) 276 (H) 323 (H)   Diabetes history: DM2 Outpatient Diabetes medications: Lantus 50 units q HS + Metformin 1000 mg bid Current orders for Inpatient glycemic control: Lantus 42 + Novolog 8 meal coverage tid + Novolog correction 0-20 units tid + 0-5 units hs  Inpatient Diabetes Program Recommendations:  Please consider: -Increase Lantus to 50 units daily -Prescription for meter ([id number]), strips, and pen needles ([id number]) on D/C.  Spoke with patient @ bedside. Patient states she ran out of her pen needles for her insulin pen and did not take her insulin "for a couple of days".  Reviewed A1c of >15.5 with patient @ bedside and discussed A1C results with them and explained what an A1C is, basic pathophysiology of DM Type 2, basic home care, basic diabetes diet nutrition principles, importance of checking CBGs and maintaining good CBG control to prevent long-term and short-term complications. Reviewed signs and symptoms of hyperglycemia and hypoglycemia and how to treat hypoglycemia at home. Also reviewed blood sugar goals at home.  RNs to provide ongoing basic DM education at bedside with this patient. Have ordered educational booklet, insulin starter kit, and DM videos.    Thank you, Nani Gasser. Kaushik Maul, RN, MSN, CDE Inpatient Glycemic Control Team Team Pager [phone]  (8am-5pm) 04/23/2016 1:39 PM

## 2016-04-23 NOTE — Progress Notes (Signed)
  Stopped in hall by Maurene Capes, RN  Pt having HR up to 160s.  K 3.6 this am and has received 40 meq.   Will give additional 40 meq ok K and check Mg.   Discussed with MD.  Will also start back on low dose coreg.     Legrand Como [address] [place], [place] 04/23/2016 2:44 PM

## 2016-04-23 NOTE — Progress Notes (Signed)
ELECTROPHYSIOLOGY CONSULT NOTE    Patient ID: [name] MRN: [id number], DOB/AGE: 1951/01/07 66 y.o.  Admit date: 04/20/2016 Date of Consult: 04/23/2016   Primary Physician: [name], MD Primary Cardiologist: Dr. Aundra Dubin Electrophysiologist: [name]  Reason for Consultation: [facility]  HPI: [name] is a 66 y.o. female with NICM w/ CRT-D, PAFib, HTN, DM, LBBB at baseline, COPD/restrictive lung disease by PFTs, record mentions medical noncompliance, was admitted to [facility] 04/20/16 with urosepsis and Ecoli bacteremia.   We have called today because of wide complex tachycardia which is irregular. She is unaware.  Reviewing ECGs she seems to have had a rate-related left bundle varying in duration from 1:30--150 ms.   Being followed by the heart failure service  LABS: K+ 3.6 BUN/Creat 17/1.09 Yesterday mag 1.8 WBC 17.9 (peak 26.6) H/H 10/31 plts 390 Lactic acid 1.0 yesterday (peak 3.10)  Device information: MDT CRT-D implanted 11/18/12, [name] No noted hx of AAD  Past Medical History:  Diagnosis Date  . Anemia    a. Noted on 07/2012 labs, instructed to f/u PCP.  Marland Kitchen Arthritis    "joints" (11/18/2012)  . Automatic implantable cardioverter-defibrillator in situ   . CAD (coronary artery disease), native coronary artery    a. Nonobstructive by cath 02/2012 (done because of low EF).  . Chronic bronchitis ([place])    "~ every other year" (11/18/2012)  . Chronic combined systolic and diastolic CHF (congestive heart failure) ([place])    a. 03/05/12 echo:  LVEF 20-25%, moderate LVH , inferior and basal to mid septal akinesis, anterior moderate to severe hypokinesis and grade 2 diastolic dysfunction. b. EF 07/2012: EF still 25% (unclear medication compliance).  . Chronic lower back pain   . Headache(784.0)    "often; maybe not daily" (11/18/2012)  . High cholesterol   . History of noncompliance with medical treatment   . Hypertension   . LBBB (left bundle branch block)   .  Orthopnea   . Tobacco abuse   . Type II diabetes mellitus ([place])      Surgical History:  Past Surgical History:  Procedure Laterality Date  . BI-VENTRICULAR IMPLANTABLE CARDIOVERTER DEFIBRILLATOR N/A 11/18/2012   Procedure: BI-VENTRICULAR IMPLANTABLE CARDIOVERTER DEFIBRILLATOR  (CRT-D);  Surgeon: Evans Lance, MD;  Location: [facility] CATH LAB;  Service: Cardiovascular;  Laterality: N/A;  . BI-VENTRICULAR IMPLANTABLE CARDIOVERTER DEFIBRILLATOR  (CRT-D)  11/18/2012  . CARDIAC CATHETERIZATION  03/04/12   nonobstructive CAD, elevated LVEDP and tortuous vessels suggestive of long-standing hypertension  . JOINT REPLACEMENT     Bilateral hip and right knee  . LEFT HEART CATH N/A 03/05/2012   Procedure: LEFT HEART CATH;  Surgeon: Larey Dresser, MD;  Location: [facility] CATH LAB;  Service: Cardiovascular;  Laterality: N/A;     Prescriptions Prior to Admission  Medication Sig Dispense Refill Last Dose  . acetaminophen (TYLENOL) 500 MG tablet Take 1 tablet (500 mg total) by mouth every 6 (six) hours as needed. (Patient taking differently: Take 500 mg by mouth every 6 (six) hours as needed for moderate pain. ) 30 tablet 0 Past Month at Unknown time  . albuterol (PROVENTIL HFA;VENTOLIN HFA) 108 (90 BASE) MCG/ACT inhaler Inhale 2 puffs into the lungs every 6 (six) hours as needed for wheezing or shortness of breath.   Past Month at Unknown time  . atorvastatin (LIPITOR) 40 MG tablet Take 1 tablet (40 mg total) by mouth daily. 30 tablet 11 Past Month at Unknown time  . carvedilol (COREG) 12.5 MG tablet Take  1 tablet (12.5 mg total) by mouth 2 (two) times daily with a meal. 60 tablet 11 Past Month at Unknown time  . enalapril (VASOTEC) 10 MG tablet Take 1 tablet (10 mg total) by mouth daily. 30 tablet 11 Past Month at Unknown time  . furosemide (LASIX) 40 MG tablet Take 1 tablet (40 mg total) by mouth 2 (two) times daily. 60 tablet 11 04/19/2016 at Unknown time  . isosorbide-hydrALAZINE (BIDIL) 20-37.5 MG tablet Take 2  tablets by mouth 3 (three) times daily. 90 tablet 11 Past Month at Unknown time  . metFORMIN (GLUCOPHAGE) 1000 MG tablet Take 1,000 mg by mouth 2 (two) times daily with a meal.   04/19/2016 at Unknown time  . potassium chloride SA (K-DUR,KLOR-CON) 20 MEQ tablet Take 1 tablet (20 mEq total) by mouth 2 (two) times daily. 60 tablet 11 Past Month at Unknown time  . spironolactone (ALDACTONE) 25 MG tablet Take 1 tablet (25 mg total) by mouth daily. 30 tablet 11 Past Month at Unknown time  . apixaban (ELIQUIS) 5 MG TABS tablet Take 1 tablet (5 mg total) by mouth 2 (two) times daily. (Patient not taking: Reported on 04/20/2016) 60 tablet 11 Not Taking at Unknown time  . Insulin Glargine (LANTUS SOLOSTAR) 100 UNIT/ML Solostar Pen Inject 50 Units into the skin at bedtime.   04/18/2016  . Oxycodone HCl 10 MG TABS Take 1-2 tablets by mouth 2 (two) times daily as needed (pain).   0 Not Taking at Unknown time  . promethazine (PHENERGAN) 25 MG tablet Take 1 tablet (25 mg total) by mouth every 8 (eight) hours as needed for nausea or vomiting. (Patient not taking: Reported on 04/20/2016) 15 tablet 0 Not Taking at Unknown time  . traMADol (ULTRAM) 50 MG tablet Take 1 tablet (50 mg total) by mouth every 6 (six) hours as needed for severe pain. (Patient not taking: Reported on 04/20/2016) 15 tablet 0 Not Taking at Unknown time    Inpatient Medications:  . apixaban  5 mg Oral BID  . atorvastatin  40 mg Oral Daily  . carvedilol  3.125 mg Oral BID WC  . cefTRIAXone (ROCEPHIN)  IV  2 g Intravenous Q24H  . furosemide  40 mg Oral BID  . insulin aspart  0-20 Units Subcutaneous TID WC  . insulin aspart  0-5 Units Subcutaneous QHS  . insulin aspart  10 Units Subcutaneous Once  . insulin aspart  8 Units Subcutaneous TID WC  . insulin glargine  42 Units Subcutaneous Daily  . isosorbide-hydrALAZINE  1 tablet Oral TID  . mouth rinse  15 mL Mouth Rinse BID  . polyethylene glycol  17 g Oral Daily  . potassium chloride  20 mEq  Oral BID  . sacubitril-valsartan  1 tablet Oral BID  . sodium chloride  500 mL Intravenous Once    Allergies:  Allergies  Allergen Reactions  . Morphine And Related Nausea And Vomiting and Other (See Comments)    Severe nausea    Social History   Social History  . Marital status: Single    Spouse name: N/A  . Number of children: N/A  . Years of education: N/A   Occupational History  . Not on file.   Social History Main Topics  . Smoking status: Former Smoker    Packs/day: 1.00    Years: 40.00    Types: Cigarettes    Start date: 06/23/1972    Quit date: 03/26/2012  . Smokeless tobacco: Never Used  . Alcohol use  No  . Drug use: No  . Sexual activity: Yes   Other Topics Concern  . Not on file   Social History Narrative  . No narrative on file     Family History  Problem Relation Age of Onset  . Heart disease Neg Hx      Review of Systems: General: No chills, fever, night sweats or weight changes  Cardiovascular:  No chest pain, dyspnea on exertion, edema, orthopnea, palpitations, paroxysmal nocturnal dyspnea Dermatological: No rash, lesions or masses Respiratory: No cough, dyspnea Urologic: No hematuria, dysuria Abdominal: No nausea, vomiting, diarrhea, bright red blood per rectum, melena, or hematemesis Neurologic: No visual changes, weakness, changes in mental status All other systems reviewed and are otherwise negative except as noted above.  Physical Exam: Vitals:   04/23/16 0549 04/23/16 0900 04/23/16 1528 04/23/16 1636  BP: (!) 154/68 139/76 120/79 105/71  Pulse: 88 99  (!) 103  Resp: 20 18 20    Temp: 98.6 F (37 C) 98.7 F (37.1 C)    TempSrc: Oral Oral    SpO2: 97% 99%    Weight: 208 lb 14.4 oz (94.8 kg)     Height:        Alert and oriented in no acute distress HENT- normal Eyes- EOMI, without scleral icterus Skin- warm and dry; without rashes LN-neg Neck- supple without thyromegaly, JVP-7-8 carotids brisk and full without  bruits Back-without CVAT or kyphosis Lungs-clear to auscultation Irregularly irregular rate and rhythm-rapid, nl S1 and S2, no murmurs gallops or rubs, S4-absent Abd-soft with active bowel sounds; no midline pulsation or hepatomegaly Pulses-intact femoral and distal MKS-without gross deformity Neuro- Ax O, CN3-12 intact, grossly normal motor and sensory function Affect engaging   Labs:   Lab Results  Component Value Date   WBC 17.9 (H) 04/23/2016   HGB 10.8 (L) 04/23/2016   HCT 31.2 (L) 04/23/2016   MCV 87.2 04/23/2016   PLT 390 04/23/2016    Recent Labs Lab 04/22/16 0215 04/23/16 0540  NA 134* 135  K 3.2* 3.6  CL 99* 100*  CO2 21* 24  BUN 24* 17  CREATININE 1.47* 1.09*  CALCIUM 8.1* 8.6*  PROT 6.7  --   BILITOT 0.7  --   ALKPHOS 122  --   ALT 24  --   AST 43*  --   GLUCOSE 202* 274*      Radiology/Studies: Dg Chest 2 View  Result Date: 04/20/2016 CLINICAL DATA:  Pt c/o centralized chest pain that radiates to her back since Tuesday. Pt endorse n/v and SOB. Hx of defibrillator, coronary artery disease, chronic bronchitis, chronic combined systolic and diastolic CHF, hypertension. EXAM: CHEST  2 VIEW COMPARISON:  04/17/2016 FINDINGS: Heart is mildly enlarged. No pulmonary edema. patient's left-sided transvenous pacemaker with leads to the right atrium, right ventricle, and coronary sinus. Overlying the right lung apex, there is question of a pulmonary nodule warranting further evaluation. IMPRESSION: 1. Mild cardiomegaly without pulmonary edema. 2. Question of right upper lobe pulmonary nodule. Further evaluation with CT of the chest is recommended. Electronically Signed   By: Nolon Nations M.D.   On: 04/20/2016 10:18   Dg Chest 2 View  Result Date: 04/17/2016 CLINICAL DATA:  LEFT-sided chest pain and shortness of breath since yesterday. EXAM: CHEST  2 VIEW COMPARISON:  10/13/2015. FINDINGS: Cardiomegaly. Multi lead AICD device with stable lead position, LEFT  subclavian approach. No active infiltrates or failure. No effusion or pneumothorax. No osseous findings. IMPRESSION: Stable chest.  Cardiomegaly. Electronically Signed  By: Staci Righter M.D.   On: 04/17/2016 15:40   Ct Angio Chest Pe W Or Wo Contrast  Result Date: 04/20/2016 CLINICAL DATA:  Patient with chest pain and abdominal pain. Nausea and vomiting. Possible pulmonary nodule on prior chest radiograph. EXAM: CT ANGIOGRAPHY CHEST WITH CONTRAST TECHNIQUE: Multidetector CT imaging of the chest was performed using the standard protocol during bolus administration of intravenous contrast. Multiplanar CT image reconstructions and MIPs were obtained to evaluate the vascular anatomy. CONTRAST:  80 cc Isovue 370 COMPARISON:  Chest radiograph earlier same day FINDINGS: Cardiovascular: No abnormal filling defect identified within the pulmonary arterial system to suggest pulmonary embolus. Evaluation is somewhat limited due to body habitus and motion artifact. Heart is enlarged. No pericardial effusion. Aorta and main pulmonary artery normal in caliber. Mediastinum/Nodes: No enlarged axillary, mediastinal or hilar lymphadenopathy. The esophagus is normal in appearance. Lungs/Pleura: Central airways are patent. Depending ground-glass opacities within the lower lobes bilaterally most compatible with atelectasis. No large area of pulmonary consolidation. No pleural effusion or pneumothorax. Upper Abdomen: Unremarkable. Musculoskeletal: Mid thoracic spine degenerative changes. Review of the MIP images confirms the above findings. IMPRESSION: No evidence for pulmonary embolus. Dependent atelectasis. Electronically Signed   By: Lovey Newcomer M.D.   On: 04/20/2016 14:31   Ct Abdomen Pelvis W Contrast  Result Date: 04/20/2016 CLINICAL DATA:  Pt c/o LLQ pain and N/V for the past 4 days Isovue 300 69ml pt is on metformin instructed to discontinue for 48 hrs ^160mL ISOVUE-300 IOPAMIDOL (ISOVUE-300) INJECTION 61% EXAM: CT  ABDOMEN AND PELVIS WITH CONTRAST TECHNIQUE: Multidetector CT imaging of the abdomen and pelvis was performed using the standard protocol following bolus administration of intravenous contrast. CONTRAST:  161mL ISOVUE-300 IOPAMIDOL (ISOVUE-300) INJECTION 61% COMPARISON:  07/05/2012 MRI pelvis, 01/26/2012 CT the abdomen and pelvis FINDINGS: Lower chest: Pacemaker leads in the coronary sinus and right ventricle. Heart size is normal. Lung bases clear. Hepatobiliary: No focal liver abnormality is seen. No gallstones, gallbladder wall thickening, or biliary dilatation. Pancreas: Unremarkable. No pancreatic ductal dilatation or surrounding inflammatory changes. Spleen: Normal in size without focal abnormality. Adrenals/Urinary Tract: Adrenal glands are normal. There is symmetric enhancement of the kidneys. No renal mass or evidence for urinary tract obstruction. The distal ureters and bladder are partially obscured by a metallic artifact in the femurs. Stomach/Bowel: Stomach and small bowel loops are normal in appearance. Colon is normal in appearance. No evidence for acute appendicitis. Average stool burden. Vascular/Lymphatic: No significant vascular findings are present. No enlarged abdominal or pelvic lymph nodes. Reproductive: The uterus is enlarged.  No adnexal mass. Other: None Musculoskeletal: Bilateral total hip arthroplasty. No acute fracture. IMPRESSION: 1.  No evidence for acute  abnormality. 2. Evaluation of the pelvis degraded by metallic artifact in bilateral total hip arthroplasty. 3. Enlargement of the uterus, similar in appearance to previous MRI in 2014. 4. Transvenous pacemaker. Electronically Signed   By: Nolon Nations M.D.   On: 04/20/2016 11:22   Dg Abd Portable 1v  Result Date: 04/22/2016 CLINICAL DATA:  Abdominal pain. EXAM: PORTABLE ABDOMEN - 1 VIEW COMPARISON:  CT 2 days prior FINDINGS: No evidence of free air on supine views. No bowel dilatation to suggest obstruction. Air throughout  small and large bowel, similar to recent CT. No increased stool burden to suggest constipation. Pelvic phleboliths are noted. Bilateral hip prostheses. IMPRESSION: No bowel obstruction or free air. Electronically Signed   By: Jeb Levering M.D.   On: 04/22/2016 02:19    EKG: Personally  reviewed  2017 sinus rhythm with left bundle branch block QRS duration 132 ms 2014 ECG prior to CRT insertion heart rate 1052 respiration 155 ms  ECG now demonstrates irregularly irregular beats with closely coupled beats being wider than narrow coupled beats consistent with atrial fibrillation with some degree of rate-related aberration  TELEMETRY:  As above Personally reviewed    04/21/16: TTE Study Conclusions - Left ventricle: The cavity size was normal. There was moderate   concentric hypertrophy. Systolic function was moderately to   severely reduced. The estimated ejection fraction was in the   range of 30% to 35%. Global hypokinesis worse in the   anteroseptal, inferior, and inferoseptal myocardium. Doppler   parameters are consistent with abnormal left ventricular   relaxation (grade 1 diastolic dysfunction). Doppler parameters   are consistent with high ventricular filling pressure. - Aortic valve: Transvalvular velocity was within the normal range.   There was no stenosis. There was no regurgitation. - Mitral valve: Transvalvular velocity was within the normal range.   There was no evidence for stenosis. There was no regurgitation. - Right ventricle: The cavity size was normal. Wall thickness was   normal. Systolic function was normal. - Right atrium: The atrium was mildly dilated. - Tricuspid valve: There was mild regurgitation. - Pulmonary arteries: Systolic pressure was within the normal   range. PA peak pressure: 26 mm Hg (S). - Pericardium, extracardiac: A small pericardial effusion was   identified. - Global longitudinal strain -12.5%, which is abnormal.  Assessment and Plan:    Nonischemic cardiomyopathy  Atrial fibrillation-paroxysmal  Left bundle branch block with rate related worsening  CRT-D-[employer]  Escherichia coli urosepsis  Medication noncompliance  Congestive heart failure-chronic-systolic  Sleep disordered breathing and apparently negative sleep study    The patient has rapid atrial fibrillation now in the context of her urosepsis. Amiodarone has been initiated and this is quite appropriate. Not withstanding this, her rates remain very rapid in the event that she does not convert spontaneously overnight, would recommend cardioversion. We will make her nothing by mouth.  I think it would be prudent to look at QRS duration with biventricular pacing versus a control sinus rate. Recent data have identified a cohort of patients whose functional status worsens if paced QRS durations are longer than her intrinsic branch block.  We will also need help from infectious diseases as to a plan for surveillance of her blood cultures following completion of her antibiotics. Consult.

## 2016-04-24 DIAGNOSIS — I4891 Unspecified atrial fibrillation: Secondary | ICD-10-CM

## 2016-04-24 DIAGNOSIS — I5043 Acute on chronic combined systolic (congestive) and diastolic (congestive) heart failure: Secondary | ICD-10-CM

## 2016-04-24 LAB — BASIC METABOLIC PANEL
Anion gap: 12 (ref 5–15)
BUN: 15 mg/dL (ref 6–20)
CO2: 29 mmol/L (ref 22–32)
Calcium: 8.9 mg/dL (ref 8.9–10.3)
Chloride: 97 mmol/L — ABNORMAL LOW (ref 101–111)
Creatinine, Ser: 1.02 mg/dL — ABNORMAL HIGH (ref 0.44–1.00)
GFR calc Af Amer: >60 mL/min (ref >60)
GFR calc non Af Amer: 57 mL/min — ABNORMAL LOW (ref >60)
Glucose, Bld: 263 mg/dL — ABNORMAL HIGH (ref 65–99)
Potassium: 3.9 mmol/L (ref 3.5–5.1)
Sodium: 138 mmol/L (ref 135–145)

## 2016-04-24 LAB — GLUCOSE, CAPILLARY
Glucose-Capillary: 167 mg/dL — ABNORMAL HIGH (ref 65–99)
Glucose-Capillary: 257 mg/dL — ABNORMAL HIGH (ref 65–99)
Glucose-Capillary: 444 mg/dL — ABNORMAL HIGH (ref 65–99)
Glucose-Capillary: 369 mg/dL — ABNORMAL HIGH (ref 65–99)
Glucose-Capillary: 224 mg/dL — ABNORMAL HIGH (ref 65–99)
Glucose-Capillary: 140 mg/dL — ABNORMAL HIGH (ref 65–99)

## 2016-04-24 LAB — CBC
HCT: 32.9 % — ABNORMAL LOW (ref 36.0–46.0)
Hemoglobin: 11.4 g/dL — ABNORMAL LOW (ref 12.0–15.0)
MCH: 30.4 pg (ref 26.0–34.0)
MCHC: 34.7 g/dL (ref 30.0–36.0)
MCV: 87.7 fL (ref 78.0–100.0)
Platelets: 435 10*3/uL — ABNORMAL HIGH (ref 150–400)
RBC: 3.75 MIL/uL — ABNORMAL LOW (ref 3.87–5.11)
RDW: 12.9 % (ref 11.5–15.5)
WBC: 16.6 10*3/uL — ABNORMAL HIGH (ref 4.0–10.5)

## 2016-04-24 LAB — MAGNESIUM: Magnesium: 2 mg/dL (ref 1.7–2.4)

## 2016-04-24 MED ORDER — SULFAMETHOXAZOLE-TRIMETHOPRIM 800-160 MG PO TABS
1.0000 | ORAL_TABLET | Freq: Two times a day (BID) | ORAL | Status: DC
  Administered 2016-04-24 – 2016-04-25 (×3): 1 via ORAL
  Filled 2016-04-24 (×4): qty 1

## 2016-04-24 MED ORDER — AMIODARONE HCL 200 MG PO TABS
400.0000 mg | ORAL_TABLET | Freq: Two times a day (BID) | ORAL | Status: DC
  Administered 2016-04-24 – 2016-04-25 (×3): 400 mg via ORAL
  Filled 2016-04-24 (×3): qty 2

## 2016-04-24 MED ORDER — ZOLPIDEM TARTRATE 5 MG PO TABS
5.0000 mg | ORAL_TABLET | Freq: Once | ORAL | Status: AC
  Administered 2016-04-24: 5 mg via ORAL
  Filled 2016-04-24: qty 1

## 2016-04-24 MED ORDER — INSULIN GLARGINE 100 UNIT/ML ~~LOC~~ SOLN
50.0000 [IU] | Freq: Every day | SUBCUTANEOUS | Status: DC
  Administered 2016-04-25: 50 [IU] via SUBCUTANEOUS
  Filled 2016-04-24: qty 0.5

## 2016-04-24 MED ORDER — INSULIN ASPART 100 UNIT/ML ~~LOC~~ SOLN
10.0000 [IU] | Freq: Three times a day (TID) | SUBCUTANEOUS | Status: DC
  Administered 2016-04-24 – 2016-04-25 (×2): 10 [IU] via SUBCUTANEOUS

## 2016-04-24 NOTE — Progress Notes (Signed)
Pt is still refusing bed alarm. Will continue to monitor.   Daytona Retana, RN

## 2016-04-24 NOTE — Progress Notes (Addendum)
No response back.  Will continue to monitor.  Collen Hostler, RN

## 2016-04-24 NOTE — Progress Notes (Addendum)
Patient in NSR, HR 95 bpm, still on Amiodarone, cardiology notified, no new orders at this time. Will follow up with day shift.   Per patient and per doctors notes yesterday possible cardioversion today if patient does not convert overnight. Patient kept NPO after midnight.  No complaints of pain or other issues. Slept mostly during the night.  Will continue to monitor.   Kimblery Diop, RN

## 2016-04-24 NOTE — Progress Notes (Addendum)
Inpatient Diabetes Program Recommendations  AACE/ADA: New Consensus Statement on Inpatient Glycemic Control (2015)  Target Ranges:  Prepandial:   less than 140 mg/dL      Peak postprandial:   less than 180 mg/dL (1-2 hours)      Critically ill patients:  140 - 180 mg/dL   Lab Results  Component Value Date   GLUCAP 257 (H) 04/24/2016   HGBA1C >15.5 (H) 04/22/2016    Review of Glycemic Control Results for [name], [name] (MRN [id number]) as of 04/24/2016 11:03  Ref. Range 04/23/2016 07:20 04/23/2016 11:32 04/23/2016 16:39 04/23/2016 22:16 04/24/2016 07:31  Glucose-Capillary Latest Ref Range: 65 - 99 mg/dL 276 (H) 323 (H) 266 (H) 167 (H) 257 (H)   Inpatient Diabetes Program Recommendations: Diabetes history: DM2 Outpatient Diabetes medications: Lantus 50 units q HS + Metformin 1000 mg bid Current orders for Inpatient glycemic control: Lantus 42 + Novolog 8 meal coverage tid + Novolog correction 0-20 units tid + 0-5 units hs  Inpatient Diabetes Program Recommendations:  Please consider: -Increase Lantus to 50 units daily -Increase Novolog correction to 10 units tid  11:10 Spoke with Joesph July PA and received orders for above adjustments.  Thank you, Nani Gasser. Izela Altier, RN, MSN, CDE Inpatient Glycemic Control Team Team Pager [phone] (8am-5pm) 04/24/2016 11:04 AM

## 2016-04-24 NOTE — Progress Notes (Addendum)
Spoke to MD on call A Hamad. Verbal order for Ambien 5 mg given.  MD aware about an IV, stated that since patient is probably going home tomorrow it is ok to be without an IV.  Leanah Kolander, RN

## 2016-04-24 NOTE — Progress Notes (Signed)
Attempted to start PIV per consult.  Pt upset about another IV stick, c/o pain in arms from being stuck so much, states to be d/c home tomorrow, refuses to allow right arm to even be assessed.  No IV meds ordered.  RN notified of pt refusal to have PIV started.  Pt states if it becomes necessary, it would be ok to attempt, but not at this time.

## 2016-04-24 NOTE — Care Management Important Message (Signed)
Important Message  Patient Details  Name: [name] MRN: [id number] Date of Birth: 01-03-1951   Medicare Important Message Given:  Yes    [name] Circle 04/24/2016, 11:15 AM

## 2016-04-24 NOTE — Progress Notes (Signed)
PROGRESS NOTE        PATIENT DETAILS Name: [name] Age: 66 y.o. Sex: female Date of Birth: 1950-09-05 Admit Date: 04/20/2016 Admitting Physician Maren Reamer, MD AXE:[id number], Gwynneth Munson, MD  Brief Narrative: Patient is a 66 y.o. female with combined chronic systolic/diastolic heart failure, ICD placement, diabetes, hypertension presented to the ED on 3/25 with septic shock secondary to gram-negative bacteremia. Managed initially by [facility], transferred to the [facility] on 3/27.  Subjective: No palpitations or chest pain this am.  Assessment/Plan: Septic shock: Secondary to Escherichia coli UTI and Escherichia coli bacteremia. Sepsis pathophysiology has resolved, she is clinically improved. Leukocytosis slowly improving.  Escherichia coli UTI with bacteremia: Likely improved with resolution of sepsis pathophysiology. Maintain on Rocephin,  transition to Bactrim.   Acute kidney injury: Hemodynamically mediated in a setting of septic shock. Resolviing with supportive measures.  Type 2 diabetes with hyperglycemia: CBGs still elevated, Lantus has been increased to 50 units, NovoLog has been increased to 10 units with meals. Follow and adjust accordingly. .  Acute on chronic combined systolic and diastolic CHF (EF 81-10% on TTE 04/21/16): EF significantly reduced from prior-etiology thought to be either secondary to sepsis or due to noncompliance. Since hemodynamically stable, she has been started on Lasix, entresto and Coreg at. Cardiology following.  A. fib with RVR: RVR occurred on 3/28-started on amiodarone-spontaneously converted back to sinus rhythm. Recommendations from cardiology are to monitor overnight.  Continue Eliquis. CHA2DSVAS2C score of atleast 4.  Hypertension: Better controlled-continue Coreg, Entresto, Bidil-follow  Minimally elevated troponin: Trend is flat and not consistent with ACS. LHC in 2014 with minimal CAD.   DVT  Prophylaxis: Full dose anticoagulation with Eliquis  Code Status: Full code   Family Communication: None at bedside  Disposition Plan: Remain inpatient-home likely 3/30  Antimicrobial agents: Anti-infectives    Start     Dose/Rate Route Frequency Ordered Stop   04/22/16 1500  cefTRIAXone (ROCEPHIN) 2 g in dextrose 5 % 50 mL IVPB     2 g 100 mL/hr over 30 Minutes Intravenous Every 24 hours 04/22/16 1429     04/21/16 1200  cefTRIAXone (ROCEPHIN) 1 g in dextrose 5 % 50 mL IVPB  Status:  Discontinued     1 g 100 mL/hr over 30 Minutes Intravenous Every 24 hours 04/20/16 1023 04/20/16 1815   04/20/16 1830  vancomycin (VANCOCIN) IVPB 750 mg/150 ml premix  Status:  Discontinued     750 mg 150 mL/hr over 60 Minutes Intravenous Every 12 hours 04/20/16 1822 04/21/16 0918   04/20/16 1830  ceFEPIme (MAXIPIME) 1 g in dextrose 5 % 50 mL IVPB  Status:  Discontinued     1 g 100 mL/hr over 30 Minutes Intravenous Every 24 hours 04/20/16 1822 04/22/16 1429   04/20/16 1030  cefTRIAXone (ROCEPHIN) 2 g in dextrose 5 % 50 mL IVPB     2 g 100 mL/hr over 30 Minutes Intravenous  Once 04/20/16 1022 04/20/16 1256      Procedures: Echo 3/26>> - Left ventricle: The cavity size was normal. There was moderate   concentric hypertrophy. Systolic function was moderately to   severely reduced. The estimated ejection fraction was in the   range of 30% to 35%. Global hypokinesis worse in the   anteroseptal, inferior, and inferoseptal myocardium. Doppler   parameters are consistent with abnormal left ventricular  relaxation (grade 1 diastolic dysfunction). Doppler parameters   are consistent with high ventricular filling pressure. - Aortic valve: Transvalvular velocity was within the normal range.   There was no stenosis. There was no regurgitation. - Mitral valve: Transvalvular velocity was within the normal range.   There was no evidence for stenosis. There was no regurgitation. - Right ventricle: The  cavity size was normal. Wall thickness was   normal. Systolic function was normal. - Right atrium: The atrium was mildly dilated. - Tricuspid valve: There was mild regurgitation. - Pulmonary arteries: Systolic pressure was within the normal   range. PA peak pressure: 26 mm Hg (S). - Pericardium, extracardiac: A small pericardial effusion was   identified. - Global longitudinal strain -12.5%, which is abnormal.  CONSULTS:  cardiology and pulmonary/intensive care  Time spent: 25- minutes-Greater than 50% of this time was spent in counseling, explanation of diagnosis, planning of further management, and coordination of care.  MEDICATIONS: Scheduled Meds: . amiodarone  400 mg Oral BID  . apixaban  5 mg Oral BID  . atorvastatin  40 mg Oral Daily  . carvedilol  3.125 mg Oral BID WC  . cefTRIAXone (ROCEPHIN)  IV  2 g Intravenous Q24H  . furosemide  40 mg Oral BID  . insulin aspart  0-20 Units Subcutaneous TID WC  . insulin aspart  0-5 Units Subcutaneous QHS  . insulin aspart  10 Units Subcutaneous Once  . insulin aspart  10 Units Subcutaneous TID WC  . [START ON 04/25/2016] insulin glargine  50 Units Subcutaneous Daily  . isosorbide-hydrALAZINE  1 tablet Oral TID  . mouth rinse  15 mL Mouth Rinse BID  . polyethylene glycol  17 g Oral Daily  . potassium chloride  20 mEq Oral BID  . sacubitril-valsartan  1 tablet Oral BID  . sodium chloride  500 mL Intravenous Once   Continuous Infusions: PRN Meds:.acetaminophen **OR** acetaminophen, albuterol, ondansetron **OR** ondansetron (ZOFRAN) IV, promethazine, traMADol   PHYSICAL EXAM: Vital signs: Vitals:   04/23/16 2222 04/24/16 0032 04/24/16 0528 04/24/16 1113  BP: 123/78 (!) 105/50 135/76 136/65  Pulse: 92 95 88   Resp:  18 18 19   Temp:   98.6 F (37 C) 98.2 F (36.8 C)  TempSrc:   Oral Oral  SpO2: 100% 100% 100% 99%  Weight:   94.4 kg (208 lb 3.2 oz)   Height:       Filed Weights   04/22/16 0414 04/23/16 0549 04/24/16 0528    Weight: 96.3 kg (212 lb 3.2 oz) 94.8 kg (208 lb 14.4 oz) 94.4 kg (208 lb 3.2 oz)   Body mass index is 32.61 kg/m.   General appearance :Awake, alert, not in any distress. Speech Clear.  Eyes:, pupils equally reactive to light and accomodation HEENT: Atraumatic and Normocephalic Neck: supple, no JVD. No cervical lymphadenopathy. Resp:Good air entry bilaterally, no rhonchi CVS: S1 S2 regular GI: Bowel sounds present, Non tender and not distended with no gaurding, rigidity or rebound. Extremities: B/L Lower Ext shows no edema, both legs are warm to touch Neurology:  speech clear,Non focal, sensation is grossly intact. Psychiatric: Normal judgment and insight. Alert and oriented x 3. Normal mood. Musculoskeletal:No digital cyanosis Skin:No Rash, warm and dry Wounds:N/A  I have personally reviewed following labs and imaging studies  LABORATORY DATA: CBC:  Recent Labs Lab 04/20/16 0928 04/20/16 1425 04/21/16 0207 04/22/16 0215 04/23/16 0540 04/24/16 0429  WBC 24.9* 22.9* 26.6* 22.2* 17.9* 16.6*  NEUTROABS 21.2*  --   --   --   --   --  HGB 12.3 10.4* 11.1* 11.0* 10.8* 11.4*  HCT 35.7* 30.5* 32.0* 32.1* 31.2* 32.9*  MCV 90.2 88.7 89.6 88.7 87.2 87.7  PLT 364 366 299 334 390 435*    Basic Metabolic Panel:  Recent Labs Lab 04/20/16 1917 04/21/16 0207 04/22/16 0215 04/23/16 0540 04/23/16 1343 04/24/16 0429  NA 130* 135 134* 135  --  138  K 3.3* 3.9 3.2* 3.6  --  3.9  CL 92* 98* 99* 100*  --  97*  CO2 25 25 21* 24  --  29  GLUCOSE 526* 361* 202* 274*  --  263*  BUN 21* 26* 24* 17  --  15  CREATININE 1.68* 2.09* 1.47* 1.09*  --  1.02*  CALCIUM 8.2* 8.0* 8.1* 8.6*  --  8.9  MG  --   --  1.8  --  2.5* 2.0  PHOS  --   --  2.2*  --   --   --     GFR: Estimated Creatinine Clearance: 66.6 mL/min (A) (by C-G formula based on SCr of 1.02 mg/dL (H)).  Liver Function Tests:  Recent Labs Lab 04/20/16 0928 04/22/16 0215  AST 21 43*  ALT 14 24  ALKPHOS 119 122   BILITOT 0.5 0.7  PROT 7.5 6.7  ALBUMIN 3.0* 2.4*    Recent Labs Lab 04/20/16 0928 04/22/16 0215  LIPASE <10* 11  AMYLASE  --  58   No results for input(s): AMMONIA in the last 168 hours.  Coagulation Profile:  Recent Labs Lab 04/20/16 1425  INR 1.18    Cardiac Enzymes:  Recent Labs Lab 04/20/16 0928 04/20/16 1425 04/22/16 0215  CKTOTAL  --   --  43  CKMB  --   --  6.1*  TROPONINI 0.10* 0.12* 0.06*    BNP (last 3 results) No results for input(s): PROBNP in the last 8760 hours.  HbA1C:  Recent Labs  04/22/16 1155  HGBA1C >15.5*    CBG:  Recent Labs Lab 04/23/16 1132 04/23/16 1639 04/23/16 2216 04/24/16 0731 04/24/16 1134  GLUCAP 323* 266* 167* 257* 444*    Lipid Profile: No results for input(s): CHOL, HDL, LDLCALC, TRIG, CHOLHDL, LDLDIRECT in the last 72 hours.  Thyroid Function Tests: No results for input(s): TSH, T4TOTAL, FREET4, T3FREE, THYROIDAB in the last 72 hours.  Anemia Panel: No results for input(s): VITAMINB12, FOLATE, FERRITIN, TIBC, IRON, RETICCTPCT in the last 72 hours.  Urine analysis:    Component Value Date/Time   COLORURINE YELLOW 04/20/2016 0935   APPEARANCEUR CLOUDY (A) 04/20/2016 0935   LABSPEC 1.018 04/20/2016 0935   PHURINE 5.0 04/20/2016 0935   GLUCOSEU >=500 (A) 04/20/2016 0935   HGBUR SMALL (A) 04/20/2016 0935   BILIRUBINUR NEGATIVE 04/20/2016 0935   KETONESUR NEGATIVE 04/20/2016 0935   PROTEINUR 30 (A) 04/20/2016 0935   UROBILINOGEN 0.2 10/04/2014 1450   NITRITE NEGATIVE 04/20/2016 0935   LEUKOCYTESUR MODERATE (A) 04/20/2016 0935    Sepsis Labs: Lactic Acid, Venous    Component Value Date/Time   LATICACIDVEN 1.0 04/22/2016 0215    MICROBIOLOGY: Recent Results (from the past 240 hour(s))  Urine culture     Status: Abnormal   Collection Time: 04/20/16  9:35 AM  Result Value Ref Range Status   Specimen Description URINE, RANDOM  Final   Special Requests NONE  Final   Culture >=100,000 COLONIES/mL  ESCHERICHIA COLI (A)  Final   Report Status 04/22/2016 FINAL  Final   Organism ID, Bacteria ESCHERICHIA COLI (A)  Final  Susceptibility   Escherichia coli - MIC*    AMPICILLIN >=32 RESISTANT Resistant     CEFAZOLIN <=4 SENSITIVE Sensitive     CEFTRIAXONE <=1 SENSITIVE Sensitive     CIPROFLOXACIN <=0.25 SENSITIVE Sensitive     GENTAMICIN <=1 SENSITIVE Sensitive     IMIPENEM <=0.25 SENSITIVE Sensitive     NITROFURANTOIN <=16 SENSITIVE Sensitive     TRIMETH/SULFA <=20 SENSITIVE Sensitive     AMPICILLIN/SULBACTAM 16 INTERMEDIATE Intermediate     PIP/TAZO <=4 SENSITIVE Sensitive     Extended ESBL NEGATIVE Sensitive     * >=100,000 COLONIES/mL ESCHERICHIA COLI  Blood Culture (routine x 2)     Status: Abnormal   Collection Time: 04/20/16 10:35 AM  Result Value Ref Range Status   Specimen Description BLOOD LEFT FOREARM  Final   Special Requests BOTTLES DRAWN AEROBIC AND ANAEROBIC 5CC  Final   Culture  Setup Time   Final    IN BOTH AEROBIC AND ANAEROBIC BOTTLES GRAM NEGATIVE RODS CRITICAL RESULT CALLED TO, READ BACK BY AND VERIFIED WITH: KAMEND(PHARMd) BY TCLEVELAND 04/21/16 AT 12:58AM    Culture ESCHERICHIA COLI (A)  Final   Report Status 04/23/2016 FINAL  Final   Organism ID, Bacteria ESCHERICHIA COLI  Final      Susceptibility   Escherichia coli - MIC*    AMPICILLIN >=32 RESISTANT Resistant     CEFAZOLIN <=4 SENSITIVE Sensitive     CEFEPIME <=1 SENSITIVE Sensitive     CEFTAZIDIME <=1 SENSITIVE Sensitive     CEFTRIAXONE <=1 SENSITIVE Sensitive     CIPROFLOXACIN <=0.25 SENSITIVE Sensitive     GENTAMICIN <=1 SENSITIVE Sensitive     IMIPENEM <=0.25 SENSITIVE Sensitive     TRIMETH/SULFA <=20 SENSITIVE Sensitive     AMPICILLIN/SULBACTAM 16 INTERMEDIATE Intermediate     PIP/TAZO <=4 SENSITIVE Sensitive     Extended ESBL NEGATIVE Sensitive     * ESCHERICHIA COLI  Blood Culture ID Panel (Reflexed)     Status: Abnormal   Collection Time: 04/20/16 10:35 AM  Result Value Ref Range  Status   Enterococcus species NOT DETECTED NOT DETECTED Final   Listeria monocytogenes NOT DETECTED NOT DETECTED Final   Staphylococcus species NOT DETECTED NOT DETECTED Final   Staphylococcus aureus NOT DETECTED NOT DETECTED Final   Streptococcus species NOT DETECTED NOT DETECTED Final   Streptococcus agalactiae NOT DETECTED NOT DETECTED Final   Streptococcus pneumoniae NOT DETECTED NOT DETECTED Final   Streptococcus pyogenes NOT DETECTED NOT DETECTED Final   Acinetobacter baumannii NOT DETECTED NOT DETECTED Final   Enterobacteriaceae species DETECTED (A) NOT DETECTED Final    Comment: Enterobacteriaceae represent a large family of gram-negative bacteria, not a single organism. CRITICAL RESULT CALLED TO, READ BACK BY AND VERIFIED WITH: TO KAMEND(PHARMD) BY TCLEVELAND 04/21/16 AT 12:58    Enterobacter cloacae complex NOT DETECTED NOT DETECTED Final   Escherichia coli DETECTED (A) NOT DETECTED Final    Comment: CRITICAL RESULT CALLED TO, READ BACK BY AND VERIFIED WITH:  TO KAMEND(PHARMD) BY TCLEVELAND 04/21/16 AT12:58AM    Klebsiella oxytoca NOT DETECTED NOT DETECTED Final   Klebsiella pneumoniae NOT DETECTED NOT DETECTED Final   Proteus species NOT DETECTED NOT DETECTED Final   Serratia marcescens NOT DETECTED NOT DETECTED Final   Carbapenem resistance NOT DETECTED NOT DETECTED Final   Haemophilus influenzae NOT DETECTED NOT DETECTED Final   Neisseria meningitidis NOT DETECTED NOT DETECTED Final   Pseudomonas aeruginosa NOT DETECTED NOT DETECTED Final   Candida albicans NOT DETECTED NOT DETECTED Final  Candida glabrata NOT DETECTED NOT DETECTED Final   Candida krusei NOT DETECTED NOT DETECTED Final   Candida parapsilosis NOT DETECTED NOT DETECTED Final   Candida tropicalis NOT DETECTED NOT DETECTED Final  Blood Culture (routine x 2)     Status: Abnormal   Collection Time: 04/20/16 10:40 AM  Result Value Ref Range Status   Specimen Description BLOOD RIGHT ANTECUBITAL  Final    Special Requests BOTTLES DRAWN AEROBIC AND ANAEROBIC 5CC  Final   Culture  Setup Time   Final    IN BOTH AEROBIC AND ANAEROBIC BOTTLES GRAM NEGATIVE RODS CRITICAL RESULT CALLED TO, READ BACK BY AND VERIFIED WITH: TO KAMEND(PHARMd) BY TCLEVELAND 04/21/16 AT 1:07AM    Culture (A)  Final    ESCHERICHIA COLI SUSCEPTIBILITIES PERFORMED ON PREVIOUS CULTURE WITHIN THE LAST 5 DAYS.    Report Status 04/23/2016 FINAL  Final  MRSA PCR Screening     Status: None   Collection Time: 04/20/16  4:56 PM  Result Value Ref Range Status   MRSA by PCR NEGATIVE NEGATIVE Final    Comment:        The GeneXpert MRSA Assay (FDA approved for NASAL specimens only), is one component of a comprehensive MRSA colonization surveillance program. It is not intended to diagnose MRSA infection nor to guide or monitor treatment for MRSA infections.     RADIOLOGY STUDIES/RESULTS: Dg Chest 2 View  Result Date: 04/20/2016 CLINICAL DATA:  Pt c/o centralized chest pain that radiates to her back since Tuesday. Pt endorse n/v and SOB. Hx of defibrillator, coronary artery disease, chronic bronchitis, chronic combined systolic and diastolic CHF, hypertension. EXAM: CHEST  2 VIEW COMPARISON:  04/17/2016 FINDINGS: Heart is mildly enlarged. No pulmonary edema. patient's left-sided transvenous pacemaker with leads to the right atrium, right ventricle, and coronary sinus. Overlying the right lung apex, there is question of a pulmonary nodule warranting further evaluation. IMPRESSION: 1. Mild cardiomegaly without pulmonary edema. 2. Question of right upper lobe pulmonary nodule. Further evaluation with CT of the chest is recommended. Electronically Signed   By: Nolon Nations M.D.   On: 04/20/2016 10:18   Dg Chest 2 View  Result Date: 04/17/2016 CLINICAL DATA:  LEFT-sided chest pain and shortness of breath since yesterday. EXAM: CHEST  2 VIEW COMPARISON:  10/13/2015. FINDINGS: Cardiomegaly. Multi lead AICD device with stable lead  position, LEFT subclavian approach. No active infiltrates or failure. No effusion or pneumothorax. No osseous findings. IMPRESSION: Stable chest.  Cardiomegaly. Electronically Signed   By: Staci Righter M.D.   On: 04/17/2016 15:40   Ct Angio Chest Pe W Or Wo Contrast  Result Date: 04/20/2016 CLINICAL DATA:  Patient with chest pain and abdominal pain. Nausea and vomiting. Possible pulmonary nodule on prior chest radiograph. EXAM: CT ANGIOGRAPHY CHEST WITH CONTRAST TECHNIQUE: Multidetector CT imaging of the chest was performed using the standard protocol during bolus administration of intravenous contrast. Multiplanar CT image reconstructions and MIPs were obtained to evaluate the vascular anatomy. CONTRAST:  80 cc Isovue 370 COMPARISON:  Chest radiograph earlier same day FINDINGS: Cardiovascular: No abnormal filling defect identified within the pulmonary arterial system to suggest pulmonary embolus. Evaluation is somewhat limited due to body habitus and motion artifact. Heart is enlarged. No pericardial effusion. Aorta and main pulmonary artery normal in caliber. Mediastinum/Nodes: No enlarged axillary, mediastinal or hilar lymphadenopathy. The esophagus is normal in appearance. Lungs/Pleura: Central airways are patent. Depending ground-glass opacities within the lower lobes bilaterally most compatible with atelectasis. No large area of pulmonary  consolidation. No pleural effusion or pneumothorax. Upper Abdomen: Unremarkable. Musculoskeletal: Mid thoracic spine degenerative changes. Review of the MIP images confirms the above findings. IMPRESSION: No evidence for pulmonary embolus. Dependent atelectasis. Electronically Signed   By: Lovey Newcomer M.D.   On: 04/20/2016 14:31   Ct Abdomen Pelvis W Contrast  Result Date: 04/20/2016 CLINICAL DATA:  Pt c/o LLQ pain and N/V for the past 4 days Isovue 300 8ml pt is on metformin instructed to discontinue for 48 hrs ^150mL ISOVUE-300 IOPAMIDOL (ISOVUE-300) INJECTION 61%  EXAM: CT ABDOMEN AND PELVIS WITH CONTRAST TECHNIQUE: Multidetector CT imaging of the abdomen and pelvis was performed using the standard protocol following bolus administration of intravenous contrast. CONTRAST:  155mL ISOVUE-300 IOPAMIDOL (ISOVUE-300) INJECTION 61% COMPARISON:  07/05/2012 MRI pelvis, 01/26/2012 CT the abdomen and pelvis FINDINGS: Lower chest: Pacemaker leads in the coronary sinus and right ventricle. Heart size is normal. Lung bases clear. Hepatobiliary: No focal liver abnormality is seen. No gallstones, gallbladder wall thickening, or biliary dilatation. Pancreas: Unremarkable. No pancreatic ductal dilatation or surrounding inflammatory changes. Spleen: Normal in size without focal abnormality. Adrenals/Urinary Tract: Adrenal glands are normal. There is symmetric enhancement of the kidneys. No renal mass or evidence for urinary tract obstruction. The distal ureters and bladder are partially obscured by a metallic artifact in the femurs. Stomach/Bowel: Stomach and small bowel loops are normal in appearance. Colon is normal in appearance. No evidence for acute appendicitis. Average stool burden. Vascular/Lymphatic: No significant vascular findings are present. No enlarged abdominal or pelvic lymph nodes. Reproductive: The uterus is enlarged.  No adnexal mass. Other: None Musculoskeletal: Bilateral total hip arthroplasty. No acute fracture. IMPRESSION: 1.  No evidence for acute  abnormality. 2. Evaluation of the pelvis degraded by metallic artifact in bilateral total hip arthroplasty. 3. Enlargement of the uterus, similar in appearance to previous MRI in 2014. 4. Transvenous pacemaker. Electronically Signed   By: Nolon Nations M.D.   On: 04/20/2016 11:22   Dg Abd Portable 1v  Result Date: 04/22/2016 CLINICAL DATA:  Abdominal pain. EXAM: PORTABLE ABDOMEN - 1 VIEW COMPARISON:  CT 2 days prior FINDINGS: No evidence of free air on supine views. No bowel dilatation to suggest obstruction. Air  throughout small and large bowel, similar to recent CT. No increased stool burden to suggest constipation. Pelvic phleboliths are noted. Bilateral hip prostheses. IMPRESSION: No bowel obstruction or free air. Electronically Signed   By: Jeb Levering M.D.   On: 04/22/2016 02:19     LOS: 4 days   Oren Binet, MD  [facility] Pager:[phone]  If 7PM-7AM, please contact night-coverage [email or web address] Password [facility] 04/24/2016, 12:29 PM

## 2016-04-24 NOTE — Progress Notes (Signed)
Pt is alert and blood sugar is high gave meal coverage after She ate subway from sitter visitng

## 2016-04-24 NOTE — Progress Notes (Addendum)
Patient ID: [name], female   DOB: September 18, 1950, 66 y.o.   MRN: [id number]     Advanced Heart Failure Rounding Note  PCP: Colleen Hey, MD Primary Cardiologist: [name]/[name]   Subjective:    Admitted on 04/20/16 with urosepsis with E. Coli bacteremia, was hypotensive and febrile. Now on ceftriaxone.  EF reduced from 45-50% ->30-35% in setting of medication noncompliance.  She is on po Lasix, diuresed well yesterday.   On 3/28, she developed wide complex tachycardia.  On further evaluation, this was likely atrial fibrillation with rate-related bundle branch block.  We started her on amiodarone gtt. She went back into NSR overnight and appears to be in NSR this morning.   Feels better this morning, no dyspnea.   Creatinine 1.42->1.47->1.68->2.09->1.47 -> 1.09 -> 1.02  Objective:   Weight Range: 208 lb 3.2 oz (94.4 kg) Body mass index is 32.61 kg/m.   Vital Signs:   Temp:  [98.6 F (37 C)-99.1 F (37.3 C)] 98.6 F (37 C) (03/29 0528) Pulse Rate:  [88-103] 88 (03/29 0528) Resp:  [18-20] 18 (03/29 0528) BP: (103-139)/(50-79) 135/76 (03/29 0528) SpO2:  [99 %-100 %] 100 % (03/29 0528) Weight:  [208 lb 3.2 oz (94.4 kg)] 208 lb 3.2 oz (94.4 kg) (03/29 0528) Last BM Date: 04/23/16  Weight change: Filed Weights   04/22/16 0414 04/23/16 0549 04/24/16 0528  Weight: 212 lb 3.2 oz (96.3 kg) 208 lb 14.4 oz (94.8 kg) 208 lb 3.2 oz (94.4 kg)    Intake/Output:   Intake/Output Summary (Last 24 hours) at 04/24/16 0808 Last data filed at 04/24/16 0600  Gross per 24 hour  Intake          1167.38 ml  Output             3100 ml  Net         -1932.62 ml     Physical Exam: General: NAD HEENT: normal Neck: supple. JVP not elevated. Carotids 2+ bilat; no bruits. No lymphadenopathy or thyromegaly appreciated. Cor: PMI nondisplaced. Regular rate & rhythm. No rubs, gallops or murmurs. Lungs: CTAB Abdomen: soft, nontender, nondistended. No hepatosplenomegaly. No bruits or  masses. Good bowel sounds. Extremities: no cyanosis, clubbing, rash, no edema Neuro: alert & orientedx3, cranial nerves grossly intact. moves all 4 extremities w/o difficulty. Affect pleasant   Telemetry: NSR with BiV pacing currently.   Labs: CBC  Recent Labs  04/23/16 0540 04/24/16 0429  WBC 17.9* 16.6*  HGB 10.8* 11.4*  HCT 31.2* 32.9*  MCV 87.2 87.7  PLT 390 409*   Basic Metabolic Panel  Recent Labs  04/22/16 0215 04/23/16 0540 04/23/16 1343 04/24/16 0429  NA 134* 135  --  138  K 3.2* 3.6  --  3.9  CL 99* 100*  --  97*  CO2 21* 24  --  29  GLUCOSE 202* 274*  --  263*  BUN 24* 17  --  15  CREATININE 1.47* 1.09*  --  1.02*  CALCIUM 8.1* 8.6*  --  8.9  MG 1.8  --  2.5* 2.0  PHOS 2.2*  --   --   --    Liver Function Tests  Recent Labs  04/22/16 0215  AST 43*  ALT 24  ALKPHOS 122  BILITOT 0.7  PROT 6.7  ALBUMIN 2.4*    Recent Labs  04/22/16 0215  LIPASE 11  AMYLASE 58   Cardiac Enzymes  Recent Labs  04/22/16 0215  CKTOTAL 43  CKMB 6.1*  TROPONINI  0.06*    BNP: BNP (last 3 results)  Recent Labs  07/19/15 0710 08/15/15 1045 04/20/16 0928  BNP 497.8* 39.0 199.5*   Imaging/Studies:  No results found.  Transthoracic Echocardiography 04/21/16 Study Conclusions  - Left ventricle: The cavity size was normal. There was moderate   concentric hypertrophy. Systolic function was moderately to   severely reduced. The estimated ejection fraction was in the   range of 30% to 35%. Global hypokinesis worse in the   anteroseptal, inferior, and inferoseptal myocardium. Doppler   parameters are consistent with abnormal left ventricular   relaxation (grade 1 diastolic dysfunction). Doppler parameters   are consistent with high ventricular filling pressure. - Aortic valve: Transvalvular velocity was within the normal range.   There was no stenosis. There was no regurgitation. - Mitral valve: Transvalvular velocity was within the normal range.    There was no evidence for stenosis. There was no regurgitation. - Right ventricle: The cavity size was normal. Wall thickness was   normal. Systolic function was normal. - Right atrium: The atrium was mildly dilated. - Tricuspid valve: There was mild regurgitation. - Pulmonary arteries: Systolic pressure was within the normal   range. PA peak pressure: 26 mm Hg (S). - Pericardium, extracardiac: A small pericardial effusion was   identified. - Global longitudinal strain -12.5%, which is abnormal.    Medications:     Scheduled Medications: . apixaban  5 mg Oral BID  . atorvastatin  40 mg Oral Daily  . carvedilol  3.125 mg Oral BID WC  . cefTRIAXone (ROCEPHIN)  IV  2 g Intravenous Q24H  . furosemide  40 mg Oral BID  . insulin aspart  0-20 Units Subcutaneous TID WC  . insulin aspart  0-5 Units Subcutaneous QHS  . insulin aspart  10 Units Subcutaneous Once  . insulin aspart  8 Units Subcutaneous TID WC  . insulin glargine  42 Units Subcutaneous Daily  . isosorbide-hydrALAZINE  1 tablet Oral TID  . mouth rinse  15 mL Mouth Rinse BID  . polyethylene glycol  17 g Oral Daily  . potassium chloride  20 mEq Oral BID  . sacubitril-valsartan  1 tablet Oral BID  . sodium chloride  500 mL Intravenous Once    Infusions: . amiodarone 30 mg/hr (04/23/16 2226)    PRN Medications: acetaminophen **OR** acetaminophen, albuterol, ondansetron **OR** ondansetron (ZOFRAN) IV, promethazine, traMADol   Assessment/Plan    1. Acute on chronic combined systolic and diastolic CHF: Nonischemic cardiomyopathy, MDT CRT-D.  EF 30-35%, down from 45-50% in 08/2015 in the setting of sepsis and questionable compliance with her medications.  She looks euvolemic today and is not dyspneic.  BP ok but do not think she has room to titrate meds.  - Continue Bidil 1 tab tid.  - Continue home Lasix + KCl. - Continue Entresto 24/26 bid today.  - Continue low dose Coreg.  - Would like to eventually add  spironolactone.   2. Urosepsis with bacteremia: Urine culture with E. Coli and blood cultures growing E. Coli.  - She is on ceftriaxone, E coli is sensitive to Cipro, would be reasonable to transition to po. - E coli is not likely to involve valves/ICD.  She does not need a TEE.   3. PAF: has Eliquis listed as a home med, started it in June 2016 after PAF was noted while she was hospitalized in June 2016. She had an episode yesterday evening of WCT.  Suspect atrial fibrillation with LBBB aberrancy (rate-related worsening  of LBBB).  She is currently off telemetry for a bath but has been in NSR this morning.  Amiodarone gtt was started last night.  - I think we can transition from amiodarone IV to po.  I will probably have her go home on amiodarone for the time being.  - She has restarted Eliquis, had been off at home for some time.  - ECG today.  - This patients CHA2DS2-VASc Score and unadjusted Ischemic Stroke Rate (% per year) is equal to 4.8 % stroke rate/year from a score of 4 Above score calculated as 1 point each if present [CHF, HTN, DM, Vascular=MI/PAD/Aortic Plaque, Age if 65-74, or Female], 2 points each if present [Age > 75, or Stroke/TIA/TE]  4. HTN: BP improved, adding back her home meds.  Would not titrate today, BP running lower.  5. Elevated troponin: Not representative of ACS, cath in 2014 with minimal CAD. Suspect demand ischemia with sepsis/hypotension.  6. AKI: In the setting of UTI, hypotension and sepsis. Resolved.   Given new atrial fibrillation with RVR yesterday, would monitor today in hospital on po amiodarone.  Probably home tomorrow.   Length of Stay: 4  [name], MD  04/24/2016, 8:08 AM Advanced Heart Failure Team  Pager [phone] M-F 7am-4pm.  Please contact [facility] for night-coverage after hours (4p -7a ) and weekends on [email or web address]

## 2016-04-25 ENCOUNTER — Inpatient Hospital Stay (HOSPITAL_COMMUNITY): Payer: Medicare Other

## 2016-04-25 LAB — BASIC METABOLIC PANEL
Anion gap: 11 (ref 5–15)
BUN: 16 mg/dL (ref 6–20)
CO2: 28 mmol/L (ref 22–32)
Calcium: 9.3 mg/dL (ref 8.9–10.3)
Chloride: 97 mmol/L — ABNORMAL LOW (ref 101–111)
Creatinine, Ser: 1.18 mg/dL — ABNORMAL HIGH (ref 0.44–1.00)
GFR calc Af Amer: 56 mL/min — ABNORMAL LOW (ref >60)
GFR calc non Af Amer: 48 mL/min — ABNORMAL LOW (ref >60)
Glucose, Bld: 162 mg/dL — ABNORMAL HIGH (ref 65–99)
Potassium: 4.6 mmol/L (ref 3.5–5.1)
Sodium: 136 mmol/L (ref 135–145)

## 2016-04-25 LAB — CBC
HCT: 36.1 % (ref 36.0–46.0)
Hemoglobin: 12.5 g/dL (ref 12.0–15.0)
MCH: 30.6 pg (ref 26.0–34.0)
MCHC: 34.6 g/dL (ref 30.0–36.0)
MCV: 88.5 fL (ref 78.0–100.0)
Platelets: 524 10*3/uL — ABNORMAL HIGH (ref 150–400)
RBC: 4.08 MIL/uL (ref 3.87–5.11)
RDW: 12.8 % (ref 11.5–15.5)
WBC: 18.8 10*3/uL — ABNORMAL HIGH (ref 4.0–10.5)

## 2016-04-25 LAB — GLUCOSE, CAPILLARY
Glucose-Capillary: 189 mg/dL — ABNORMAL HIGH (ref 65–99)
Glucose-Capillary: 431 mg/dL — ABNORMAL HIGH (ref 65–99)
Glucose-Capillary: 281 mg/dL — ABNORMAL HIGH (ref 65–99)

## 2016-04-25 MED ORDER — GLUCOSE BLOOD VI STRP: ORAL_STRIP | 0 refills | Status: AC

## 2016-04-25 MED ORDER — SPIRONOLACTONE 25 MG PO TABS: 12.5000 mg | ORAL_TABLET | Freq: Every day | ORAL | 0 refills | Status: DC

## 2016-04-25 MED ORDER — FREESTYLE SYSTEM KIT: 1.0000 | PACK | Freq: Three times a day (TID) | 1 refills | Status: AC

## 2016-04-25 MED ORDER — FREESTYLE LANCETS MISC: 0 refills | Status: DC

## 2016-04-25 MED ORDER — METFORMIN HCL 1000 MG PO TABS: 1000.0000 mg | ORAL_TABLET | Freq: Two times a day (BID) | ORAL | 0 refills | Status: DC

## 2016-04-25 MED ORDER — SPIRONOLACTONE 25 MG PO TABS
12.5000 mg | ORAL_TABLET | Freq: Every day | ORAL | Status: DC
  Administered 2016-04-25: 12.5 mg via ORAL
  Filled 2016-04-25: qty 1

## 2016-04-25 MED ORDER — APIXABAN 5 MG PO TABS: 5.0000 mg | ORAL_TABLET | Freq: Two times a day (BID) | ORAL | 0 refills | Status: DC

## 2016-04-25 MED ORDER — SACUBITRIL-VALSARTAN 24-26 MG PO TABS: 1.0000 | ORAL_TABLET | Freq: Two times a day (BID) | ORAL | 0 refills | Status: DC

## 2016-04-25 MED ORDER — INSULIN ASPART 100 UNIT/ML ~~LOC~~ SOLN: 10.0000 [IU] | Freq: Every day | SUBCUTANEOUS | Status: DC

## 2016-04-25 MED ORDER — INSULIN ASPART 100 UNIT/ML ~~LOC~~ SOLN: 12.0000 [IU] | Freq: Every day | SUBCUTANEOUS | Status: DC

## 2016-04-25 MED ORDER — ATORVASTATIN CALCIUM 40 MG PO TABS: 40.0000 mg | ORAL_TABLET | Freq: Every day | ORAL | 0 refills | Status: DC

## 2016-04-25 MED ORDER — POTASSIUM CHLORIDE CRYS ER 20 MEQ PO TBCR: 20.0000 meq | EXTENDED_RELEASE_TABLET | Freq: Every day | ORAL | 0 refills | Status: DC

## 2016-04-25 MED ORDER — INSULIN GLARGINE 100 UNIT/ML ~~LOC~~ SOLN: 55.0000 [IU] | Freq: Every day | SUBCUTANEOUS | Status: DC

## 2016-04-25 MED ORDER — AMIODARONE HCL 200 MG PO TABS: ORAL_TABLET | ORAL | 0 refills | Status: DC

## 2016-04-25 MED ORDER — CARVEDILOL 3.125 MG PO TABS: 3.1250 mg | ORAL_TABLET | Freq: Two times a day (BID) | ORAL | 0 refills | Status: DC

## 2016-04-25 MED ORDER — ISOSORB DINITRATE-HYDRALAZINE 20-37.5 MG PO TABS: 1.0000 | ORAL_TABLET | Freq: Three times a day (TID) | ORAL | 0 refills | Status: DC

## 2016-04-25 MED ORDER — INSULIN PEN NEEDLE 32G X 8 MM MISC: 0 refills | Status: DC

## 2016-04-25 MED ORDER — INSULIN ASPART 100 UNIT/ML ~~LOC~~ SOLN
20.0000 [IU] | Freq: Once | SUBCUTANEOUS | Status: AC
Start: 2016-04-25 — End: 2016-04-25
  Administered 2016-04-25: 20 [IU] via SUBCUTANEOUS

## 2016-04-25 MED ORDER — INSULIN GLARGINE 100 UNIT/ML SOLOSTAR PEN: 55.0000 [IU] | PEN_INJECTOR | Freq: Every day | SUBCUTANEOUS | 0 refills | Status: DC

## 2016-04-25 MED ORDER — POTASSIUM CHLORIDE CRYS ER 20 MEQ PO TBCR
20.0000 meq | EXTENDED_RELEASE_TABLET | Freq: Every day | ORAL | Status: DC
  Administered 2016-04-25: 20 meq via ORAL
  Filled 2016-04-25: qty 1

## 2016-04-25 MED ORDER — INSULIN ASPART 100 UNIT/ML ~~LOC~~ SOLN: 15.0000 [IU] | Freq: Every day | SUBCUTANEOUS | Status: DC

## 2016-04-25 MED ORDER — INSULIN ASPART 100 UNIT/ML FLEXPEN: PEN_INJECTOR | SUBCUTANEOUS | 0 refills | Status: DC

## 2016-04-25 MED ORDER — SULFAMETHOXAZOLE-TRIMETHOPRIM 800-160 MG PO TABS: 1.0000 | ORAL_TABLET | Freq: Two times a day (BID) | ORAL | 0 refills | Status: DC

## 2016-04-25 MED ORDER — FUROSEMIDE 40 MG PO TABS: 40.0000 mg | ORAL_TABLET | Freq: Two times a day (BID) | ORAL | 0 refills | Status: DC

## 2016-04-25 MED ORDER — ALBUTEROL SULFATE HFA 108 (90 BASE) MCG/ACT IN AERS: 2.0000 | INHALATION_SPRAY | Freq: Four times a day (QID) | RESPIRATORY_TRACT | 0 refills | Status: DC | PRN

## 2016-04-25 NOTE — Progress Notes (Signed)
Jacqualin Combes, RN    Benefit check for [employer].       S/W MARSHY @ [employer] [facility] # [phone]   ENTRESTO 24-26 MG BID   COVER- YES  CO-PAY- $ 3.70  TIER- 3 DRUG  PRIOR APPROVAL- NO   PHARMACY : [facility] AND [facility]   PATIENT ALSO HAS MEDICAID  CO-PAY-$ 3.70 FOR EACH RX

## 2016-04-25 NOTE — Progress Notes (Signed)
Heart Failure Navigator Consult Note  Presentation: per Dr Haroldine Laws [name] is a 66 y.o. female with a history of NICM with LBBB s/p MDT CRT-D (10/2012), HTN, DMT2, HLD, COPD on exertional O2, restrictive lung disease by PFTs, PAF on Eliquis, and medical non compliance who presented to [facility] on 04/20/16 with CP/SOB/abdominal pain with N/V. Found to have urosepsis and Ecoli bacteremia. Cardiology consulted with worsening LVEF and mildly elevated troponin.   She was admitted with CHF exacerbation in 02/2012. EF 20-25% on echo, LHC with nonobstructive CAD. SPEP negative, UPEP negative, HIV negative.She was started on cardiac meds and discharged. In 07/2012, she was admitted again with CHF exacerbation. She had run out of Lasix. She was diuresed and discharged. She had a chronic LBBB, and Medtronic CRT-D device was placed in 10/14. She was admitted in 6/16 with hypertensive emergency and CHF exacerbation.She had been out of her medications for some time. She was very volume overloaded and was diuresed. Cardiac meds restarted. Echo showed EF 40-45%, mild LVH, septal and inferior hypokinesis.  Also of note, she had PFTs in 9/16 showing restrictive spirometry with low lung volume and DLCO. She was supposed to get a high resolution CT to evaluate for interstitial lung disease but never had the study.  She was admitted in 6/17 with chest pain. She had been out of Lasix for a week and was actually thought to be volume overloaded. She was ruled out for MI. She was diuresed with IV Lasix.   She was last seen by Dr. Aundra Dubin in the advanced CHF clinic in 07/2015. She was doing well at that time. High resolution CT was ordered by never competed. Repeat 2D ECHO did show EF improved to 45-50% with G2DD.   She was in her usual state of health until this past weekend. She presented to [facility] yesterday with CP, SOB, abdominal pain with N/V.   In ER found to have temp 103.9, met SIRS criteria, wbc ct 24900,  UA was c/w UTI. While awaiting admission BP dropped to 68/49, lactic acid was 3.5. Antihypertensives were held. She was given IVFs &ABX administered. Lactic acid cleared to 2.6. Both blood and urine are growing GNRs. Found to have urosepsis with Ecoli bacteremia. 2D ECHO showed global hypokinesis and reduction from 45-50% now down to 30-35%.    Currently feeling much better. Continues to get NS at 125/hr. Denies dyspnea Only complaining of abdomina pain currently and having to urinate. No CP or SOB. No LE edema, orthopnea or PND. No dizziness or syncope. No blood in stool or urine. No palpitations. She says she has run out of some of her medications but not sure which ones. Says she has been out for probably about 6 months. She has continued to take the medications that she has. She thinks she had run out of lasix.   Past Medical History:  Diagnosis Date  . Anemia    a. Noted on 07/2012 labs, instructed to f/u PCP.  Marland Kitchen Arthritis    "joints" (11/18/2012)  . Automatic implantable cardioverter-defibrillator in situ   . CAD (coronary artery disease), native coronary artery    a. Nonobstructive by cath 02/2012 (done because of low EF).  . Chronic bronchitis ([place])    "~ every other year" (11/18/2012)  . Chronic combined systolic and diastolic CHF (congestive heart failure) ([place])    a. 03/05/12 echo:  LVEF 20-25%, moderate LVH , inferior and basal to mid septal akinesis, anterior moderate to severe hypokinesis and grade 2  diastolic dysfunction. b. EF 07/2012: EF still 25% (unclear medication compliance).  . Chronic lower back pain   . Headache(784.0)    "often; maybe not daily" (11/18/2012)  . High cholesterol   . History of noncompliance with medical treatment   . Hypertension   . LBBB (left bundle branch block)   . Orthopnea   . Tobacco abuse   . Type II diabetes mellitus ([place])     Social History   Social History  . Marital status: Single    Spouse name: N/A  . Number of children: N/A  .  Years of education: N/A   Social History Main Topics  . Smoking status: Former Smoker    Packs/day: 1.00    Years: 40.00    Types: Cigarettes    Start date: 06/23/1972    Quit date: 03/26/2012  . Smokeless tobacco: Never Used  . Alcohol use No  . Drug use: No  . Sexual activity: Yes   Other Topics Concern  . None   Social History Narrative  . None    ECHO:Study Conclusions: 04/21/16  - Left ventricle: The cavity size was normal. There was moderate   concentric hypertrophy. Systolic function was moderately to   severely reduced. The estimated ejection fraction was in the   range of 30% to 35%. Global hypokinesis worse in the   anteroseptal, inferior, and inferoseptal myocardium. Doppler   parameters are consistent with abnormal left ventricular   relaxation (grade 1 diastolic dysfunction). Doppler parameters   are consistent with high ventricular filling pressure. - Aortic valve: Transvalvular velocity was within the normal range.   There was no stenosis. There was no regurgitation. - Mitral valve: Transvalvular velocity was within the normal range.   There was no evidence for stenosis. There was no regurgitation. - Right ventricle: The cavity size was normal. Wall thickness was   normal. Systolic function was normal. - Right atrium: The atrium was mildly dilated. - Tricuspid valve: There was mild regurgitation. - Pulmonary arteries: Systolic pressure was within the normal   range. PA peak pressure: 26 mm Hg (S). - Pericardium, extracardiac: A small pericardial effusion was   identified. - Global longitudinal strain -12.5%, which is abnormal.  ------------------------------------------------------------------- Study data:  The previous study was not available, so comparison was made to the report of 08/29/2015.  Study status:  Routine. Procedure:  The patient reported no pain pre or post test. Transthoracic echocardiography. Image quality was adequate.   Study completion:  There were no complications.          Transthoracic echocardiography.  M-mode, complete 2D, spectral Doppler, and color Doppler.  Birthdate:  Patient birthdate: 1950-03-08.  Age:  Patient is 66 yr old.  Sex:  Gender: female.    BMI: 32.6 kg/m^2.  Blood pressure:     101/48  Patient status:  Inpatient.  Study date: Study date: 04/21/2016. Study time: 09:45 AM.  Location:  ICU/CCU  BNP    Component Value Date/Time   BNP 199.5 (H) 04/20/2016 0928    ProBNP    Component Value Date/Time   PROBNP 818.5 (H) 01/05/2014 1404     Education Assessment and Provision:  Detailed education and instructions provided on heart failure disease management including the following:  Signs and symptoms of Heart Failure When to call the physician Importance of daily weights Low sodium diet Fluid restriction Medication management Anticipated future follow-up appointments  Patient education given on each of the above topics.  Patient acknowledges understanding and acceptance of  all instructions.  I have spoken with Ms. Chappuis regarding her HF and current hospitalization.  She tells me that she does not have a scale.  We have some scales on order and I will be sure that she is provided one through the AHF Clinic.  She also tells me that she needs a CBG machine for use at home.  She does admit that she was not taking medications prior to admission--however says that she can get them.  She will follow-up in the AHF Clinic after discharge.  Education Materials:  "Living Better With Heart Failure" Booklet, Daily Weight Tracker Tool   High Risk Criteria for Readmission and/or Poor Patient Outcomes:  (Recommend Follow-up with Advanced Heart Failure Clinic)--yes   EF <30%- 30-35% with grade 1 dias dys  2 or more admissions in 6 months- No  Difficult social situation- No  Demonstrates medication noncompliance- yes admits to not taking medications prior to admission   Barriers of  Care:  Knowledge and compliance  Discharge Planning:   Plans to return to home with son.  She says that son is "not home much".  She is agreeable to [place] and I have sent in the email referral.

## 2016-04-25 NOTE — Progress Notes (Addendum)
Patient ID: NOURA PURPURA, female   DOB: 08-25-1950, 66 y.o.   MRN: [id number]     Advanced Heart Failure Rounding Note  PCP: [name], MD Primary Cardiologist: Dr. Delray Reza/[name]   Subjective:    Admitted on 04/20/16 with urosepsis with E. Coli bacteremia, was hypotensive and febrile. Now on ceftriaxone.  EF reduced from 45-50% ->30-35% in setting of medication noncompliance.  On 3/28, she developed wide complex tachycardia.  On further evaluation, this was likely atrial fibrillation with rate-related bundle branch block.  We started her on amiodarone gtt and went back in NSR.  Remains on po amio.   WBC trending up. Sent for renal US to rule out abscess.   Creatinine 1.18  Objective:   Weight Range: 208 lb 11.2 oz (94.7 kg) Body mass index is 32.69 kg/m.   Vital Signs:   Temp:  [98.2 F (36.8 C)-99.2 F (37.3 C)] 98.3 F (36.8 C) (03/30 0800) Pulse Rate:  [93-99] 96 (03/30 0800) Resp:  [14-20] 14 (03/30 0800) BP: (114-140)/(65-81) 136/77 (03/30 0800) SpO2:  [99 %-100 %] 100 % (03/30 0800) Weight:  [208 lb 11.2 oz (94.7 kg)] 208 lb 11.2 oz (94.7 kg) (03/30 0501) Last BM Date: 04/24/16  Weight change: Filed Weights   04/23/16 0549 04/24/16 0528 04/25/16 0501  Weight: 208 lb 14.4 oz (94.8 kg) 208 lb 3.2 oz (94.4 kg) 208 lb 11.2 oz (94.7 kg)    Intake/Output:   Intake/Output Summary (Last 24 hours) at 04/25/16 0815 Last data filed at 04/25/16 0600  Gross per 24 hour  Intake              840 ml  Output             2550 ml  Net            -1710 ml     Physical Exam: General: NAD HEENT: normal Neck: supple. JVP 7. Carotids 2+ bilat; no bruits. No lymphadenopathy or thyromegaly appreciated. Cor: PMI nondisplaced. Regular rate & rhythm. No rubs, gallops or murmurs. Lungs: Clear bilaterally Abdomen: soft, nontender, nondistended. No hepatosplenomegaly. No bruits or masses. Good bowel sounds. Extremities: no cyanosis, clubbing, rash, no ankle edema Neuro:  alert & orientedx3, cranial nerves grossly intact. moves all 4 extremities w/o difficulty. Affect pleasant   Telemetry: Reviewed personally.  Difficult to to baseline artifact but regular.    Labs: CBC  Recent Labs  04/24/16 0429 04/25/16 0432  WBC 16.6* 18.8*  HGB 11.4* 12.5  HCT 32.9* 36.1  MCV 87.7 88.5  PLT 435* 366*   Basic Metabolic Panel  Recent Labs  04/23/16 1343 04/24/16 0429 04/25/16 0432  NA  --  138 136  K  --  3.9 4.6  CL  --  97* 97*  CO2  --  29 28  GLUCOSE  --  263* 162*  BUN  --  15 16  CREATININE  --  1.02* 1.18*  CALCIUM  --  8.9 9.3  MG 2.5* 2.0  --    Liver Function Tests No results for input(s): AST, ALT, ALKPHOS, BILITOT, PROT, ALBUMIN in the last 72 hours. No results for input(s): LIPASE, AMYLASE in the last 72 hours. Cardiac Enzymes No results for input(s): CKTOTAL, CKMB, CKMBINDEX, TROPONINI in the last 72 hours.  BNP: BNP (last 3 results)  Recent Labs  07/19/15 0710 08/15/15 1045 04/20/16 0928  BNP 497.8* 39.0 199.5*   Imaging/Studies:  No results found.  Transthoracic Echocardiography 04/21/16 Study Conclusions  - Left  ventricle: The cavity size was normal. There was moderate   concentric hypertrophy. Systolic function was moderately to   severely reduced. The estimated ejection fraction was in the   range of 30% to 35%. Global hypokinesis worse in the   anteroseptal, inferior, and inferoseptal myocardium. Doppler   parameters are consistent with abnormal left ventricular   relaxation (grade 1 diastolic dysfunction). Doppler parameters   are consistent with high ventricular filling pressure. - Aortic valve: Transvalvular velocity was within the normal range.   There was no stenosis. There was no regurgitation. - Mitral valve: Transvalvular velocity was within the normal range.   There was no evidence for stenosis. There was no regurgitation. - Right ventricle: The cavity size was normal. Wall thickness was   normal.  Systolic function was normal. - Right atrium: The atrium was mildly dilated. - Tricuspid valve: There was mild regurgitation. - Pulmonary arteries: Systolic pressure was within the normal   range. PA peak pressure: 26 mm Hg (S). - Pericardium, extracardiac: A small pericardial effusion was   identified. - Global longitudinal strain -12.5%, which is abnormal.    Medications:     Scheduled Medications: . amiodarone  400 mg Oral BID  . apixaban  5 mg Oral BID  . atorvastatin  40 mg Oral Daily  . carvedilol  3.125 mg Oral BID WC  . furosemide  40 mg Oral BID  . insulin aspart  0-20 Units Subcutaneous TID WC  . insulin aspart  0-5 Units Subcutaneous QHS  . insulin aspart  10 Units Subcutaneous Once  . insulin aspart  10 Units Subcutaneous TID WC  . insulin glargine  50 Units Subcutaneous Daily  . isosorbide-hydrALAZINE  1 tablet Oral TID  . mouth rinse  15 mL Mouth Rinse BID  . polyethylene glycol  17 g Oral Daily  . potassium chloride  20 mEq Oral BID  . sacubitril-valsartan  1 tablet Oral BID  . sodium chloride  500 mL Intravenous Once  . sulfamethoxazole-trimethoprim  1 tablet Oral Q12H    Infusions:   PRN Medications: acetaminophen **OR** acetaminophen, albuterol, ondansetron **OR** ondansetron (ZOFRAN) IV, promethazine, traMADol   Assessment/Plan    1. Acute on chronic combined systolic and diastolic CHF: Nonischemic cardiomyopathy, MDT CRT-D.  EF 30-35%, down from 45-50% in 08/2015 in the setting of sepsis and questionable compliance with her medications.  Volume status stable today.  - Continue Bidil 1 tab tid.  - Continue lasix 40 mg twice a day. Add spironolactone 12.5 daily and decrease KCl to once a day.  - Continue Entresto 24/26 bid today.  - Continue low dose Coreg.   2. Urosepsis with bacteremia: Urine culture with E. Coli and blood cultures growing E. Coli.  - She is on ceftriaxone, E coli is sensitive to Cipro, would be reasonable to transition to po. Per  primary team.  - E coli is not likely to involve valves/ICD.  She does not need a TEE.   - WBC trending up 16>18.8 Primary team obtaining renal US to rule out abscess.  3. PAF: has Eliquis listed as a home med, started it in June 2016 after PAF was noted while she was hospitalized in June 2016. She had an episode yesterday evening of WCT.  Suspect atrial fibrillation with LBBB aberrancy (rate-related worsening of LBBB).   - Will get ECG today to confirm rhythm (regular but lots of baseline artifact).  - Now on amio 400 mg twice a day with plan to continue x 1  week, then 200 mg twice a day x 1 week, then 200 mg daily.  - Continue Eliquis 5 mg twice a day.  - This patients CHA2DS2-VASc Score and unadjusted Ischemic Stroke Rate (% per year) is equal to 4.8 % stroke rate/year from a score of 4 Above score calculated as 1 point each if present [CHF, HTN, DM, Vascular=MI/PAD/Aortic Plaque, Age if 65-74, or Female], 2 points each if present [Age > 75, or Stroke/TIA/TE]  4. HTN: BP improved, adding back her home meds.   5. Elevated troponin: Not representative of ACS, cath in 2014 with minimal CAD. Suspect demand ischemia with sepsis/hypotension.  6. AKI: In the setting of UTI, hypotension and sepsis. Resolved.  7. Disposition: Likely home today if [place] ok.  Needs close followup with HF clinic, will enroll in paramedicine program.  Meds for home: Lasix 40 mg bid, KCl 20 daily, spironolactone 12.5 daily, Entresto 24/26 bid, Bidil 1 tab tid, Coreg 3.125 bid, Eliquis 5 mg bid, amiodarone 400 mg bid x 5 more days then 200 mg bid x 1 week then 200 mg daily.   Length of Stay: [place] 04/25/2016, 8:15 AM Advanced Heart Failure Team  Pager [phone] M-F 7am-4pm.  Please contact [facility] for night-coverage after hours (4p -7a ) and weekends on [email or web address]

## 2016-04-25 NOTE — Progress Notes (Signed)
D/c instructions discussed with patient and pt verbalized understanding. Pt d/c home accompanied by her sister.

## 2016-04-25 NOTE — Care Management Note (Signed)
Case Management Note  Patient Details  Name: [name] MRN: [id number] Date of Birth: 19-Jul-1950  Subjective/Objective:                 Patient from home, will DC with [facility], covered with Medicaid. No other CM needs identified at this time. Patient has order to DC to home.    Action/Plan:   Expected Discharge Date:  04/25/16               Expected Discharge Plan:  Home/Self Care  In-House Referral:     Discharge planning Services  CM Consult  Post Acute Care Choice:    Choice offered to:     DME Arranged:    DME Agency:     HH Arranged:    HH Agency:     Status of Service:  Completed, signed off  If discussed at [employer] of Stay Meetings, dates discussed:    Additional Comments:  Carles Collet, RN 04/25/2016, 1:22 PM

## 2016-04-25 NOTE — Discharge Summary (Addendum)
PATIENT DETAILS Name: [name] Age: 66 y.o. Sex: female Date of Birth: 02/29/1950 MRN: [id number]. Admitting Physician: Maren Reamer, MD [id number], Gwynneth Munson, MD  Admit Date: 04/20/2016 Discharge date: 04/25/2016  Recommendations for Outpatient Follow-up:  1. Follow up with PCP in 1-2 weeks 2. Please obtain BMP/CBC in one week 3. Please blood cultures, after she has completed a course of antimicrobial therapy to document resolution of bacteremia.  4. Will need ongoing counseling regarding importance of compliance to medication.  Admitted From:  Home  Disposition: Goodnight: No  Equipment/Devices: None  Discharge Condition: Stable  CODE STATUS: FULL CODE  Diet recommendation:  Heart Healthy / Carb Modified  Brief Summary: See H&P, Labs, Consult and Test reports for all details in brief, Patient is a 66 y.o. female with combined chronic systolic/diastolic heart failure, ICD placement, diabetes, hypertension presented to the ED on 3/25 with septic shock secondary to gram-negative bacteremia. Managed initially by [facility], transferred to the [facility] hospitalist service on 3/27.  Brief Hospital Course: Septic shock: Secondary to Escherichia coli UTI and Escherichia coli bacteremia. Sepsis pathophysiology has resolved, she is clinically improved. Leukocytosis improving but still persistent. No evidence of abscess and renal ultrasound. She does not have RUQ tenderness to suggest biliary infection. Suspect that WBC count should improve with ongoing antimicrobial therapy. Please repeat blood cultures and CBC once patient completes antimicrobial therapy course.  Escherichia coli UTI with bacteremia: Likely improved with resolution of sepsis pathophysiology. Maintained on Rocephin, and subsequently  transition to Bactrim. Although she has leukocytosis-she is completely asymptomatic and not toxic appearing. She does not look sick at all. She is insisting on being  discharged home today. She will need repeat blood cultures once she completes antimicrobial therapy-deferred to her PCP.  Acute kidney injury: Hemodynamically mediated in a setting of septic shock. Resolviing with supportive measures.  Type 2 diabetes with hyperglycemia: CBGs although better-occasionally still has CBGs up to 400. She does not wish to remain hospitalized any longer, and is requesting that she be discharged home today. She claims she is very familiar with insulin-and will be able to manage her sugars at home. Have increased Lantus to 55 units, will continue sliding scale insulin. She will follow-up with her PCP for further optimization. Symptoms of hypoglycemia and necessary intervention were discussed   Acute on chronic combined systolic and diastolic CHF (EF 43-32% on TTE 04/21/16): EF significantly reduced from prior-etiology thought to be either secondary to sepsis or due to noncompliance. Since hemodynamically stable, she has been started on Lasix, entresto and Coreg at. Cardiology followed closely, recommendations are to discharge from their point of view, appointment for follow-up has been made-see below.  A. fib with RVR: RVR occurred on 3/28-started on amiodarone-spontaneously converted back to sinus rhythm. Recommendations from cardiology are to monitor overnight.  Continue Eliquis. CHA2DSVAS2C score of atleast 4.  Hypertension: Better controlled-continue Coreg, Entresto, Bidil-follow  Minimally elevated troponin: Trend is flat and not consistent with ACS. LHC in 2014 with minimal CAD.   Procedures/Studies: Echo 3/26>> - Left ventricle: The cavity size was normal. There was moderate concentric hypertrophy. Systolic function was moderately to severely reduced. The estimated ejection fraction was in the range of 30% to 35%. Global hypokinesis worse in the anteroseptal, inferior, and inferoseptal myocardium. Doppler parameters are consistent with abnormal  left ventricular relaxation (grade 1 diastolic dysfunction). Doppler parameters are consistent with high ventricular filling pressure. - Aortic valve: Transvalvular velocity was within the normal range. There was  no stenosis. There was no regurgitation. - Mitral valve: Transvalvular velocity was within the normal range. There was no evidence for stenosis. There was no regurgitation. - Right ventricle: The cavity size was normal. Wall thickness was normal. Systolic function was normal. - Right atrium: The atrium was mildly dilated. - Tricuspid valve: There was mild regurgitation. - Pulmonary arteries: Systolic pressure was within the normal range. PA peak pressure: 26 mm Hg (S). - Pericardium, extracardiac: A small pericardial effusion was identified. - Global longitudinal strain -12.5%, which is abnormal.  Discharge Diagnoses:  Principal Problem:   Sepsis ([place]) Active Problems:   Chest pain   Hypertension   CAD (coronary artery disease), native coronary artery   Chronic combined systolic and diastolic CHF (congestive heart failure) (H[facility])   DM (diabetes mellitus) ([place])   UTI (urinary tract infection)   Paroxysmal atrial fibrillation (H[facility])   Biventricular automatic implantable cardioverter defibrillator in situ   Discharge Instructions:  Activity:  As tolerated with Full fall precautions use walker/cane & assistance as needed  Discharge Instructions    (HEART FAILURE PATIENTS) Call MD:  Anytime you have any of the following symptoms: 1) 3 pound weight gain in 24 hours or 5 pounds in 1 week 2) shortness of breath, with or without a dry hacking cough 3) swelling in the hands, feet or stomach 4) if you have to sleep on extra pillows at night in order to breathe.    Complete by:  As directed    Diet - low sodium heart healthy    Complete by:  As directed    Heart Failure patients record your daily weight using the same scale at the same time of day    Complete by:  As  directed    Increase activity slowly    Complete by:  As directed    STOP any activity that causes chest pain, shortness of breath, dizziness, sweating, or exessive weakness    Complete by:  As directed      Allergies as of 04/25/2016      Reactions   Morphine And Related Nausea And Vomiting, Other (See Comments)   Severe nausea      Medication List    STOP taking these medications   enalapril 10 MG tablet Commonly known as:  VASOTEC   Oxycodone HCl 10 MG Tabs   promethazine 25 MG tablet Commonly known as:  PHENERGAN   traMADol 50 MG tablet Commonly known as:  ULTRAM     TAKE these medications   acetaminophen 500 MG tablet Commonly known as:  TYLENOL Take 1 tablet (500 mg total) by mouth every 6 (six) hours as needed. What changed:  reasons to take this   albuterol 108 (90 Base) MCG/ACT inhaler Commonly known as:  PROVENTIL HFA;VENTOLIN HFA Inhale 2 puffs into the lungs every 6 (six) hours as needed for wheezing or shortness of breath.   amiodarone 200 MG tablet Commonly known as:  PACERONE Amiodarone 400 mg (2 tabs) twice daily x 5 more days, then, Amiodarone 200 mg twice daily for 1 week,then. Amiodarone 200 mg daily.   apixaban 5 MG Tabs tablet Commonly known as:  ELIQUIS Take 1 tablet (5 mg total) by mouth 2 (two) times daily.   atorvastatin 40 MG tablet Commonly known as:  LIPITOR Take 1 tablet (40 mg total) by mouth daily.   carvedilol 3.125 MG tablet Commonly known as:  COREG Take 1 tablet (3.125 mg total) by mouth 2 (two) times daily with a meal.  What changed:  medication strength  how much to take   freestyle lancets Use as instructed   furosemide 40 MG tablet Commonly known as:  LASIX Take 1 tablet (40 mg total) by mouth 2 (two) times daily.   glucose blood test strip Commonly known as:  FREESTYLE TEST STRIPS Use as instructed   glucose monitoring kit monitoring kit 1 each by Does not apply route 4 (four) times daily - after meals and  at bedtime. 1 month Diabetic Testing Supplies for QAC-QHS accuchecks.   insulin aspart 100 UNIT/ML FlexPen Commonly known as:  NOVOLOG FLEXPEN 0-20 Units, Subcutaneous, 3 times daily with meals CBG < 70: implement hypoglycemia protocol CBG 70 - 120: 0 units CBG 121 - 150: 3 units CBG 151 - 200: 4 units CBG 201 - 250: 7 units CBG 251 - 300: 11 units CBG 301 - 350: 15 units CBG 351 - 400: 20 units CBG > 400: call MD   Insulin Glargine 100 UNIT/ML Solostar Pen Commonly known as:  LANTUS SOLOSTAR Inject 55 Units into the skin daily. What changed:  how much to take  when to take this   Insulin Pen Needle 32G X 8 MM Misc Use as directed   isosorbide-hydrALAZINE 20-37.5 MG tablet Commonly known as:  BIDIL Take 1 tablet by mouth 3 (three) times daily. What changed:  how much to take   metFORMIN 1000 MG tablet Commonly known as:  GLUCOPHAGE Take 1 tablet (1,000 mg total) by mouth 2 (two) times daily with a meal.   potassium chloride SA 20 MEQ tablet Commonly known as:  K-DUR,KLOR-CON Take 1 tablet (20 mEq total) by mouth daily. What changed:  when to take this   sacubitril-valsartan 24-26 MG Commonly known as:  ENTRESTO Take 1 tablet by mouth 2 (two) times daily.   spironolactone 25 MG tablet Commonly known as:  ALDACTONE Take 0.5 tablets (12.5 mg total) by mouth daily. What changed:  how much to take   sulfamethoxazole-trimethoprim 800-160 MG tablet Commonly known as:  BACTRIM DS,SEPTRA DS Take 1 tablet by mouth every 12 (twelve) hours.      Follow-up Information    [name], MD Follow up on 05/05/2016.   Specialty:  Cardiology Why:  at 9:00 [address]000 Contact information: [address] [place] [address] [phone]        [name], MD. Schedule an appointment as soon as possible for a visit in 1 week(s).   Specialty:  Family Medicine Contact information: [address] [place] [address] [phone]            Allergies  Allergen Reactions  . Morphine And Related Nausea And Vomiting and Other (See Comments)    Severe nausea    Consultations:   cardiology and pulmonary/intensive care  Other Procedures/Studies: Dg Chest 2 View  Result Date: 04/20/2016 CLINICAL DATA:  Pt c/o centralized chest pain that radiates to her back since Tuesday. Pt endorse n/v and SOB. Hx of defibrillator, coronary artery disease, chronic bronchitis, chronic combined systolic and diastolic CHF, hypertension. EXAM: CHEST  2 VIEW COMPARISON:  04/17/2016 FINDINGS: Heart is mildly enlarged. No pulmonary edema. patient's left-sided transvenous pacemaker with leads to the right atrium, right ventricle, and coronary sinus. Overlying the right lung apex, there is question of a pulmonary nodule warranting further evaluation. IMPRESSION: 1. Mild cardiomegaly without pulmonary edema. 2. Question of right upper lobe pulmonary nodule. Further evaluation with CT of the chest is recommended. Electronically Signed  By: Nolon Nations M.D.   On: 04/20/2016 10:18   Dg Chest 2 View  Result Date: 04/17/2016 CLINICAL DATA:  LEFT-sided chest pain and shortness of breath since yesterday. EXAM: CHEST  2 VIEW COMPARISON:  10/13/2015. FINDINGS: Cardiomegaly. Multi lead AICD device with stable lead position, LEFT subclavian approach. No active infiltrates or failure. No effusion or pneumothorax. No osseous findings. IMPRESSION: Stable chest.  Cardiomegaly. Electronically Signed   By: Staci Righter M.D.   On: 04/17/2016 15:40   Ct Angio Chest Pe W Or Wo Contrast  Result Date: 04/20/2016 CLINICAL DATA:  Patient with chest pain and abdominal pain. Nausea and vomiting. Possible pulmonary nodule on prior chest radiograph. EXAM: CT ANGIOGRAPHY CHEST WITH CONTRAST TECHNIQUE: Multidetector CT imaging of the chest was performed using the standard protocol during bolus administration of intravenous contrast. Multiplanar CT image reconstructions and MIPs were  obtained to evaluate the vascular anatomy. CONTRAST:  80 cc Isovue 370 COMPARISON:  Chest radiograph earlier same day FINDINGS: Cardiovascular: No abnormal filling defect identified within the pulmonary arterial system to suggest pulmonary embolus. Evaluation is somewhat limited due to body habitus and motion artifact. Heart is enlarged. No pericardial effusion. Aorta and main pulmonary artery normal in caliber. Mediastinum/Nodes: No enlarged axillary, mediastinal or hilar lymphadenopathy. The esophagus is normal in appearance. Lungs/Pleura: Central airways are patent. Depending ground-glass opacities within the lower lobes bilaterally most compatible with atelectasis. No large area of pulmonary consolidation. No pleural effusion or pneumothorax. Upper Abdomen: Unremarkable. Musculoskeletal: Mid thoracic spine degenerative changes. Review of the MIP images confirms the above findings. IMPRESSION: No evidence for pulmonary embolus. Dependent atelectasis. Electronically Signed   By: Lovey Newcomer M.D.   On: 04/20/2016 14:31   Ct Abdomen Pelvis W Contrast  Result Date: 04/20/2016 CLINICAL DATA:  Pt c/o LLQ pain and N/V for the past 4 days Isovue 300 63m pt is on metformin instructed to discontinue for 48 hrs ^1064mISOVUE-300 IOPAMIDOL (ISOVUE-300) INJECTION 61% EXAM: CT ABDOMEN AND PELVIS WITH CONTRAST TECHNIQUE: Multidetector CT imaging of the abdomen and pelvis was performed using the standard protocol following bolus administration of intravenous contrast. CONTRAST:  10045mSOVUE-300 IOPAMIDOL (ISOVUE-300) INJECTION 61% COMPARISON:  07/05/2012 MRI pelvis, 01/26/2012 CT the abdomen and pelvis FINDINGS: Lower chest: Pacemaker leads in the coronary sinus and right ventricle. Heart size is normal. Lung bases clear. Hepatobiliary: No focal liver abnormality is seen. No gallstones, gallbladder wall thickening, or biliary dilatation. Pancreas: Unremarkable. No pancreatic ductal dilatation or surrounding inflammatory  changes. Spleen: Normal in size without focal abnormality. Adrenals/Urinary Tract: Adrenal glands are normal. There is symmetric enhancement of the kidneys. No renal mass or evidence for urinary tract obstruction. The distal ureters and bladder are partially obscured by a metallic artifact in the femurs. Stomach/Bowel: Stomach and small bowel loops are normal in appearance. Colon is normal in appearance. No evidence for acute appendicitis. Average stool burden. Vascular/Lymphatic: No significant vascular findings are present. No enlarged abdominal or pelvic lymph nodes. Reproductive: The uterus is enlarged.  No adnexal mass. Other: None Musculoskeletal: Bilateral total hip arthroplasty. No acute fracture. IMPRESSION: 1.  No evidence for acute  abnormality. 2. Evaluation of the pelvis degraded by metallic artifact in bilateral total hip arthroplasty. 3. Enlargement of the uterus, similar in appearance to previous MRI in 2014. 4. Transvenous pacemaker. Electronically Signed   By: EliNolon NationsD.   On: 04/20/2016 11:22   Us Koreanal  Result Date: 04/25/2016 CLINICAL DATA:  Bacteremia EXAM: RENAL / URINARY TRACT ULTRASOUND  COMPLETE COMPARISON:  CT abdomen and pelvis April 20, 2016 FINDINGS: Right Kidney: Length: 12.4 cm. Echogenicity and renal cortical thickness are within normal limits. No mass, perinephric fluid, or hydronephrosis visualized. No sonographically demonstrable calculus or ureterectasis. Left Kidney: Length: 12.2 cm. Echogenicity and renal cortical thickness are within normal limits. No mass, perinephric fluid, or hydronephrosis visualized. No sonographically demonstrable calculus or ureterectasis. Bladder: Appears normal for degree of bladder distention. IMPRESSION: Study within normal limits. In particular, no abscess or abnormal fluid. No infectious appearing lesion evident. Electronically Signed   By: Lowella Grip III M.D.   On: 04/25/2016 09:22   Dg Abd Portable 1v  Result Date:  04/22/2016 CLINICAL DATA:  Abdominal pain. EXAM: PORTABLE ABDOMEN - 1 VIEW COMPARISON:  CT 2 days prior FINDINGS: No evidence of free air on supine views. No bowel dilatation to suggest obstruction. Air throughout small and large bowel, similar to recent CT. No increased stool burden to suggest constipation. Pelvic phleboliths are noted. Bilateral hip prostheses. IMPRESSION: No bowel obstruction or free air. Electronically Signed   By: Jeb Levering M.D.   On: 04/22/2016 02:19      TODAY-DAY OF DISCHARGE:  Subjective:   Bland Span today has no headache,no chest abdominal pain,no new weakness tingling or numbness, feels much better wants to go home today.   Objective:   Blood pressure 136/77, pulse 96, temperature 98.3 F (36.8 C), temperature source Oral, resp. rate 14, height _0  (1.702 m), weight 94.7 kg (208 lb 11.2 oz), SpO2 100 %.  Intake/Output Summary (Last 24 hours) at 04/25/16 1317 Last data filed at 04/25/16 0920  Gross per 24 hour  Intake              240 ml  Output             2550 ml  Net            -2310 ml   Filed Weights   04/23/16 0549 04/24/16 0528 04/25/16 0501  Weight: 94.8 kg (208 lb 14.4 oz) 94.4 kg (208 lb 3.2 oz) 94.7 kg (208 lb 11.2 oz)    Exam: Awake Alert, Oriented *3, No new F.N deficits, Normal affect [place].AT,PERRAL Supple Neck,No JVD, No cervical lymphadenopathy appriciated.  Symmetrical Chest wall movement, Good air movement bilaterally, CTAB RRR,No Gallops,Rubs or new Murmurs, No Parasternal Heave +ve B.Sounds, Abd Soft, Non tender, No organomegaly appriciated, No rebound -guarding or rigidity. No Cyanosis, Clubbing or edema, No new Rash or bruise   PERTINENT RADIOLOGIC STUDIES: Dg Chest 2 View  Result Date: 04/20/2016 CLINICAL DATA:  Pt c/o centralized chest pain that radiates to her back since Tuesday. Pt endorse n/v and SOB. Hx of defibrillator, coronary artery disease, chronic bronchitis, chronic combined systolic and diastolic CHF,  hypertension. EXAM: CHEST  2 VIEW COMPARISON:  04/17/2016 FINDINGS: Heart is mildly enlarged. No pulmonary edema. patient's left-sided transvenous pacemaker with leads to the right atrium, right ventricle, and coronary sinus. Overlying the right lung apex, there is question of a pulmonary nodule warranting further evaluation. IMPRESSION: 1. Mild cardiomegaly without pulmonary edema. 2. Question of right upper lobe pulmonary nodule. Further evaluation with CT of the chest is recommended. Electronically Signed   By: Nolon Nations M.D.   On: 04/20/2016 10:18   Dg Chest 2 View  Result Date: 04/17/2016 CLINICAL DATA:  LEFT-sided chest pain and shortness of breath since yesterday. EXAM: CHEST  2 VIEW COMPARISON:  10/13/2015. FINDINGS: Cardiomegaly. Multi lead AICD device with stable lead position,  LEFT subclavian approach. No active infiltrates or failure. No effusion or pneumothorax. No osseous findings. IMPRESSION: Stable chest.  Cardiomegaly. Electronically Signed   By: Staci Righter M.D.   On: 04/17/2016 15:40   Ct Angio Chest Pe W Or Wo Contrast  Result Date: 04/20/2016 CLINICAL DATA:  Patient with chest pain and abdominal pain. Nausea and vomiting. Possible pulmonary nodule on prior chest radiograph. EXAM: CT ANGIOGRAPHY CHEST WITH CONTRAST TECHNIQUE: Multidetector CT imaging of the chest was performed using the standard protocol during bolus administration of intravenous contrast. Multiplanar CT image reconstructions and MIPs were obtained to evaluate the vascular anatomy. CONTRAST:  80 cc Isovue 370 COMPARISON:  Chest radiograph earlier same day FINDINGS: Cardiovascular: No abnormal filling defect identified within the pulmonary arterial system to suggest pulmonary embolus. Evaluation is somewhat limited due to body habitus and motion artifact. Heart is enlarged. No pericardial effusion. Aorta and main pulmonary artery normal in caliber. Mediastinum/Nodes: No enlarged axillary, mediastinal or hilar  lymphadenopathy. The esophagus is normal in appearance. Lungs/Pleura: Central airways are patent. Depending ground-glass opacities within the lower lobes bilaterally most compatible with atelectasis. No large area of pulmonary consolidation. No pleural effusion or pneumothorax. Upper Abdomen: Unremarkable. Musculoskeletal: Mid thoracic spine degenerative changes. Review of the MIP images confirms the above findings. IMPRESSION: No evidence for pulmonary embolus. Dependent atelectasis. Electronically Signed   By: Lovey Newcomer M.D.   On: 04/20/2016 14:31   Ct Abdomen Pelvis W Contrast  Result Date: 04/20/2016 CLINICAL DATA:  Pt c/o LLQ pain and N/V for the past 4 days Isovue 300 32m pt is on metformin instructed to discontinue for 48 hrs ^1057mISOVUE-300 IOPAMIDOL (ISOVUE-300) INJECTION 61% EXAM: CT ABDOMEN AND PELVIS WITH CONTRAST TECHNIQUE: Multidetector CT imaging of the abdomen and pelvis was performed using the standard protocol following bolus administration of intravenous contrast. CONTRAST:  10069mSOVUE-300 IOPAMIDOL (ISOVUE-300) INJECTION 61% COMPARISON:  07/05/2012 MRI pelvis, 01/26/2012 CT the abdomen and pelvis FINDINGS: Lower chest: Pacemaker leads in the coronary sinus and right ventricle. Heart size is normal. Lung bases clear. Hepatobiliary: No focal liver abnormality is seen. No gallstones, gallbladder wall thickening, or biliary dilatation. Pancreas: Unremarkable. No pancreatic ductal dilatation or surrounding inflammatory changes. Spleen: Normal in size without focal abnormality. Adrenals/Urinary Tract: Adrenal glands are normal. There is symmetric enhancement of the kidneys. No renal mass or evidence for urinary tract obstruction. The distal ureters and bladder are partially obscured by a metallic artifact in the femurs. Stomach/Bowel: Stomach and small bowel loops are normal in appearance. Colon is normal in appearance. No evidence for acute appendicitis. Average stool burden.  Vascular/Lymphatic: No significant vascular findings are present. No enlarged abdominal or pelvic lymph nodes. Reproductive: The uterus is enlarged.  No adnexal mass. Other: None Musculoskeletal: Bilateral total hip arthroplasty. No acute fracture. IMPRESSION: 1.  No evidence for acute  abnormality. 2. Evaluation of the pelvis degraded by metallic artifact in bilateral total hip arthroplasty. 3. Enlargement of the uterus, similar in appearance to previous MRI in 2014. 4. Transvenous pacemaker. Electronically Signed   By: EliNolon NationsD.   On: 04/20/2016 11:22   Us [place]  Result Date: 04/25/2016 CLINICAL DATA:  Bacteremia EXAM: RENAL / URINARY TRACT ULTRASOUND COMPLETE COMPARISON:  CT abdomen and pelvis April 20, 2016 FINDINGS: Right Kidney: Length: 12.4 cm. Echogenicity and renal cortical thickness are within normal limits. No mass, perinephric fluid, or hydronephrosis visualized. No sonographically demonstrable calculus or ureterectasis. Left Kidney: Length: 12.2 cm. Echogenicity and renal cortical thickness are within  normal limits. No mass, perinephric fluid, or hydronephrosis visualized. No sonographically demonstrable calculus or ureterectasis. Bladder: Appears normal for degree of bladder distention. IMPRESSION: Study within normal limits. In particular, no abscess or abnormal fluid. No infectious appearing lesion evident. Electronically Signed   By: Lowella Grip III M.D.   On: 04/25/2016 09:22   Dg Abd Portable 1v  Result Date: 04/22/2016 CLINICAL DATA:  Abdominal pain. EXAM: PORTABLE ABDOMEN - 1 VIEW COMPARISON:  CT 2 days prior FINDINGS: No evidence of free air on supine views. No bowel dilatation to suggest obstruction. Air throughout small and large bowel, similar to recent CT. No increased stool burden to suggest constipation. Pelvic phleboliths are noted. Bilateral hip prostheses. IMPRESSION: No bowel obstruction or free air. Electronically Signed   By: Jeb Levering M.D.   On:  04/22/2016 02:19     PERTINENT LAB RESULTS: CBC:  Recent Labs  04/24/16 0429 04/25/16 0432  WBC 16.6* 18.8*  HGB 11.4* 12.5  HCT 32.9* 36.1  PLT 435* 524*   CMET CMP     Component Value Date/Time   NA 136 04/25/2016 0432   K 4.6 04/25/2016 0432   CL 97 (L) 04/25/2016 0432   CO2 28 04/25/2016 0432   GLUCOSE 162 (H) 04/25/2016 0432   BUN 16 04/25/2016 0432   CREATININE 1.18 (H) 04/25/2016 0432   CREATININE 0.89 06/23/2012 1128   CALCIUM 9.3 04/25/2016 0432   PROT 6.7 04/22/2016 0215   ALBUMIN 2.4 (L) 04/22/2016 0215   AST 43 (H) 04/22/2016 0215   ALT 24 04/22/2016 0215   ALKPHOS 122 04/22/2016 0215   BILITOT 0.7 04/22/2016 0215   GFRNONAA 48 (L) 04/25/2016 0432   GFRNONAA 71 06/23/2012 1128   GFRAA 56 (L) 04/25/2016 0432   GFRAA 82 06/23/2012 1128    GFR Estimated Creatinine Clearance: 57.6 mL/min (A) (by C-G formula based on SCr of 1.18 mg/dL (H)). No results for input(s): LIPASE, AMYLASE in the last 72 hours. No results for input(s): CKTOTAL, CKMB, CKMBINDEX, TROPONINI in the last 72 hours. Invalid input(s): POCBNP No results for input(s): DDIMER in the last 72 hours. No results for input(s): HGBA1C in the last 72 hours. No results for input(s): CHOL, HDL, LDLCALC, TRIG, CHOLHDL, LDLDIRECT in the last 72 hours. No results for input(s): TSH, T4TOTAL, T3FREE, THYROIDAB in the last 72 hours.  Invalid input(s): FREET3 No results for input(s): VITAMINB12, FOLATE, FERRITIN, TIBC, IRON, RETICCTPCT in the last 72 hours. Coags: No results for input(s): INR in the last 72 hours.  Invalid input(s): PT Microbiology: Recent Results (from the past 240 hour(s))  Urine culture     Status: Abnormal   Collection Time: 04/20/16  9:35 AM  Result Value Ref Range Status   Specimen Description URINE, RANDOM  Final   Special Requests NONE  Final   Culture >=100,000 COLONIES/mL ESCHERICHIA COLI (A)  Final   Report Status 04/22/2016 FINAL  Final   Organism ID, Bacteria  ESCHERICHIA COLI (A)  Final      Susceptibility   Escherichia coli - MIC*    AMPICILLIN >=32 RESISTANT Resistant     CEFAZOLIN <=4 SENSITIVE Sensitive     CEFTRIAXONE <=1 SENSITIVE Sensitive     CIPROFLOXACIN <=0.25 SENSITIVE Sensitive     GENTAMICIN <=1 SENSITIVE Sensitive     IMIPENEM <=0.25 SENSITIVE Sensitive     NITROFURANTOIN <=16 SENSITIVE Sensitive     TRIMETH/SULFA <=20 SENSITIVE Sensitive     AMPICILLIN/SULBACTAM 16 INTERMEDIATE Intermediate     PIP/TAZO <=4  SENSITIVE Sensitive     Extended ESBL NEGATIVE Sensitive     * >=100,000 COLONIES/mL ESCHERICHIA COLI  Blood Culture (routine x 2)     Status: Abnormal   Collection Time: 04/20/16 10:35 AM  Result Value Ref Range Status   Specimen Description BLOOD LEFT FOREARM  Final   Special Requests BOTTLES DRAWN AEROBIC AND ANAEROBIC 5CC  Final   Culture  Setup Time   Final    IN BOTH AEROBIC AND ANAEROBIC BOTTLES GRAM NEGATIVE RODS CRITICAL RESULT CALLED TO, READ BACK BY AND VERIFIED WITH: KAMEND(PHARMd) BY TCLEVELAND 04/21/16 AT 12:58AM    Culture ESCHERICHIA COLI (A)  Final   Report Status 04/23/2016 FINAL  Final   Organism ID, Bacteria ESCHERICHIA COLI  Final      Susceptibility   Escherichia coli - MIC*    AMPICILLIN >=32 RESISTANT Resistant     CEFAZOLIN <=4 SENSITIVE Sensitive     CEFEPIME <=1 SENSITIVE Sensitive     CEFTAZIDIME <=1 SENSITIVE Sensitive     CEFTRIAXONE <=1 SENSITIVE Sensitive     CIPROFLOXACIN <=0.25 SENSITIVE Sensitive     GENTAMICIN <=1 SENSITIVE Sensitive     IMIPENEM <=0.25 SENSITIVE Sensitive     TRIMETH/SULFA <=20 SENSITIVE Sensitive     AMPICILLIN/SULBACTAM 16 INTERMEDIATE Intermediate     PIP/TAZO <=4 SENSITIVE Sensitive     Extended ESBL NEGATIVE Sensitive     * ESCHERICHIA COLI  Blood Culture ID Panel (Reflexed)     Status: Abnormal   Collection Time: 04/20/16 10:35 AM  Result Value Ref Range Status   Enterococcus species NOT DETECTED NOT DETECTED Final   Listeria monocytogenes NOT  DETECTED NOT DETECTED Final   Staphylococcus species NOT DETECTED NOT DETECTED Final   Staphylococcus aureus NOT DETECTED NOT DETECTED Final   Streptococcus species NOT DETECTED NOT DETECTED Final   Streptococcus agalactiae NOT DETECTED NOT DETECTED Final   Streptococcus pneumoniae NOT DETECTED NOT DETECTED Final   Streptococcus pyogenes NOT DETECTED NOT DETECTED Final   Acinetobacter baumannii NOT DETECTED NOT DETECTED Final   Enterobacteriaceae species DETECTED (A) NOT DETECTED Final    Comment: Enterobacteriaceae represent a large family of gram-negative bacteria, not a single organism. CRITICAL RESULT CALLED TO, READ BACK BY AND VERIFIED WITH: TO KAMEND(PHARMD) BY TCLEVELAND 04/21/16 AT 12:58    Enterobacter cloacae complex NOT DETECTED NOT DETECTED Final   Escherichia coli DETECTED (A) NOT DETECTED Final    Comment: CRITICAL RESULT CALLED TO, READ BACK BY AND VERIFIED WITH:  TO KAMEND(PHARMD) BY TCLEVELAND 04/21/16 AT12:58AM    Klebsiella oxytoca NOT DETECTED NOT DETECTED Final   Klebsiella pneumoniae NOT DETECTED NOT DETECTED Final   Proteus species NOT DETECTED NOT DETECTED Final   Serratia marcescens NOT DETECTED NOT DETECTED Final   Carbapenem resistance NOT DETECTED NOT DETECTED Final   Haemophilus influenzae NOT DETECTED NOT DETECTED Final   Neisseria meningitidis NOT DETECTED NOT DETECTED Final   Pseudomonas aeruginosa NOT DETECTED NOT DETECTED Final   Candida albicans NOT DETECTED NOT DETECTED Final   Candida glabrata NOT DETECTED NOT DETECTED Final   Candida krusei NOT DETECTED NOT DETECTED Final   Candida parapsilosis NOT DETECTED NOT DETECTED Final   Candida tropicalis NOT DETECTED NOT DETECTED Final  Blood Culture (routine x 2)     Status: Abnormal   Collection Time: 04/20/16 10:40 AM  Result Value Ref Range Status   Specimen Description BLOOD RIGHT ANTECUBITAL  Final   Special Requests BOTTLES DRAWN AEROBIC AND ANAEROBIC 5CC  Final   Culture  Setup Time   Final     IN BOTH AEROBIC AND ANAEROBIC BOTTLES GRAM NEGATIVE RODS CRITICAL RESULT CALLED TO, READ BACK BY AND VERIFIED WITH: TO KAMEND(PHARMd) BY TCLEVELAND 04/21/16 AT 1:07AM    Culture (A)  Final    ESCHERICHIA COLI SUSCEPTIBILITIES PERFORMED ON PREVIOUS CULTURE WITHIN THE LAST 5 DAYS.    Report Status 04/23/2016 FINAL  Final  MRSA PCR Screening     Status: None   Collection Time: 04/20/16  4:56 PM  Result Value Ref Range Status   MRSA by PCR NEGATIVE NEGATIVE Final    Comment:        The GeneXpert MRSA Assay (FDA approved for NASAL specimens only), is one component of a comprehensive MRSA colonization surveillance program. It is not intended to diagnose MRSA infection nor to guide or monitor treatment for MRSA infections.     FURTHER DISCHARGE INSTRUCTIONS:  Get Medicines reviewed and adjusted: Please take all your medications with you for your next visit with your Primary MD  Laboratory/radiological data: Please request your Primary MD to go over all hospital tests and procedure/radiological results at the follow up, please ask your Primary MD to get all Hospital records sent to his/her office.  In some cases, they will be blood work, cultures and biopsy results pending at the time of your discharge. Please request that your primary care M.D. goes through all the records of your hospital data and follows up on these results.  Also Note the following: If you experience worsening of your admission symptoms, develop shortness of breath, life threatening emergency, suicidal or homicidal thoughts you must seek medical attention immediately by calling 911 or calling your MD immediately  if symptoms less severe.  You must read complete instructions/literature along with all the possible adverse reactions/side effects for all the Medicines you take and that have been prescribed to you. Take any new Medicines after you have completely understood and accpet all the possible adverse  reactions/side effects.   Do not drive when taking Pain medications or sleeping medications (Benzodaizepines)  Do not take more than prescribed Pain, Sleep and Anxiety Medications. It is not advisable to combine anxiety,sleep and pain medications without talking with your primary care practitioner  Special Instructions: If you have smoked or chewed Tobacco  in the last 2 yrs please stop smoking, stop any regular Alcohol  and or any Recreational drug use.  Wear Seat belts while driving.  Please note: You were cared for by a hospitalist during your hospital stay. Once you are discharged, your primary care physician will handle any further medical issues. Please note that NO REFILLS for any discharge medications will be authorized once you are discharged, as it is imperative that you return to your primary care physician (or establish a relationship with a primary care physician if you do not have one) for your post hospital discharge needs so that they can reassess your need for medications and monitor your lab values.  Total Time spent coordinating discharge including counseling, education and face to face time equals 45 minutes.  SignedOren Binet 04/25/2016 1:17 PM

## 2016-04-25 NOTE — Progress Notes (Signed)
Patient with no complaints or concerns during 7pm - 7am shift. Slept during the night.   Allure Greaser, RN 

## 2016-05-02 ENCOUNTER — Encounter: Payer: Self-pay | Admitting: Cardiology

## 2016-05-02 ENCOUNTER — Other Ambulatory Visit (HOSPITAL_COMMUNITY): Payer: Self-pay

## 2016-05-02 NOTE — Progress Notes (Addendum)
Paramedicine Encounter    Patient ID: [name], female    DOB: Jul 14, 1950, 66 y.o.   MRN: [id number]  Patient Care Team: [name], MD as PCP - General (Family Medicine)  Patient Active Problem List   Diagnosis Date Noted  . Paroxysmal atrial fibrillation (HCC)   . Biventricular automatic implantable cardioverter defibrillator in situ   . Sepsis ([place]) 04/20/2016  . UTI (urinary tract infection) 04/20/2016  . Dyspnea 07/19/2015  . Interstitial lung disease ([place]) 11/20/2014  . SIRS (systemic inflammatory response syndrome) ([place]) 02/03/2014  . DM (diabetes mellitus) ([place]) 02/03/2014  . Tachycardia 06/14/2013  . Chronic combined systolic and diastolic CHF (congestive heart failure) ([place]) 09/29/2012  . Hypoxia 03/06/2012  . Hypertensive heart disease 03/06/2012  . Nonischemic cardiomyopathy ([place]) 03/06/2012  . Tobacco abuse 03/06/2012  . Type 2 diabetes mellitus ([place]) 03/06/2012  . Hypertension 03/06/2012  . CAD (coronary artery disease), native coronary artery 03/06/2012  . Acute on chronic combined systolic and diastolic heart failure ([place]) 03/06/2012  . Hypokalemia 03/06/2012  . Acute bronchitis 02/01/2009  . Sleep apnea 02/01/2009  . Chest pain 02/01/2009    Current Outpatient Prescriptions:  .  acetaminophen (TYLENOL) 500 MG tablet, Take 1 tablet (500 mg total) by mouth every 6 (six) hours as needed. (Patient taking differently: Take 500 mg by mouth every 6 (six) hours as needed for moderate pain. ), Disp: 30 tablet, Rfl: 0 .  albuterol (PROVENTIL HFA;VENTOLIN HFA) 108 (90 Base) MCG/ACT inhaler, Inhale 2 puffs into the lungs every 6 (six) hours as needed for wheezing or shortness of breath., Disp: 18 g, Rfl: 0 .  amiodarone (PACERONE) 200 MG tablet, Amiodarone 400 mg (2 tabs) twice daily x 5 more days, then, Amiodarone 200 mg twice daily for 1 week,then. Amiodarone 200 mg daily., Disp: 120 tablet, Rfl: 0 .  apixaban (ELIQUIS) 5 MG TABS tablet, Take 1 tablet (5 mg  total) by mouth 2 (two) times daily., Disp: 60 tablet, Rfl: 0 .  atorvastatin (LIPITOR) 40 MG tablet, Take 1 tablet (40 mg total) by mouth daily., Disp: 30 tablet, Rfl: 0 .  carvedilol (COREG) 3.125 MG tablet, Take 1 tablet (3.125 mg total) by mouth 2 (two) times daily with a meal., Disp: 30 tablet, Rfl: 0 .  furosemide (LASIX) 40 MG tablet, Take 1 tablet (40 mg total) by mouth 2 (two) times daily., Disp: 60 tablet, Rfl: 0 .  glucose blood (FREESTYLE TEST STRIPS) test strip, Use as instructed, Disp: 100 each, Rfl: 0 .  glucose monitoring kit (FREESTYLE) monitoring kit, 1 each by Does not apply route 4 (four) times daily - after meals and at bedtime. 1 month Diabetic Testing Supplies for QAC-QHS accuchecks., Disp: 1 each, Rfl: 1 .  insulin aspart (NOVOLOG FLEXPEN) 100 UNIT/ML FlexPen, 0-20 Units, Subcutaneous, 3 times daily with meals CBG < 70: implement hypoglycemia protocol CBG 70 - 120: 0 units CBG 121 - 150: 3 units CBG 151 - 200: 4 units CBG 201 - 250: 7 units CBG 251 - 300: 11 units CBG 301 - 350: 15 units CBG 351 - 400: 20 units CBG > 400: call MD, Disp: 15 mL, Rfl: 0 .  Insulin Glargine (LANTUS SOLOSTAR) 100 UNIT/ML Solostar Pen, Inject 55 Units into the skin daily., Disp: 15 mL, Rfl: 0 .  Insulin Pen Needle 32G X 8 MM MISC, Use as directed, Disp: 100 each, Rfl: 0 .  isosorbide-hydrALAZINE (BIDIL) 20-37.5 MG tablet, Take 1 tablet by mouth 3 (three) times daily., Disp: 90  tablet, Rfl: 0 .  Lancets (FREESTYLE) lancets, Use as instructed, Disp: 100 each, Rfl: 0 .  metFORMIN (GLUCOPHAGE) 1000 MG tablet, Take 1 tablet (1,000 mg total) by mouth 2 (two) times daily with a meal., Disp: 30 tablet, Rfl: 0 .  potassium chloride SA (K-DUR,KLOR-CON) 20 MEQ tablet, Take 1 tablet (20 mEq total) by mouth daily., Disp: 60 tablet, Rfl: 0 .  sacubitril-valsartan (ENTRESTO) 24-26 MG, Take 1 tablet by mouth 2 (two) times daily., Disp: 60 tablet, Rfl: 0 .  spironolactone (ALDACTONE) 25 MG tablet, Take 0.5 tablets  (12.5 mg total) by mouth daily., Disp: 30 tablet, Rfl: 0 .  sulfamethoxazole-trimethoprim (BACTRIM DS,SEPTRA DS) 800-160 MG tablet, Take 1 tablet by mouth every 12 (twelve) hours., Disp: 12 tablet, Rfl: 0 Allergies  Allergen Reactions  . Morphine And Related Nausea And Vomiting and Other (See Comments)    Severe nausea     Social History   Social History  . Marital status: Single    Spouse name: N/A  . Number of children: N/A  . Years of education: N/A   Occupational History  . Not on file.   Social History Main Topics  . Smoking status: Former Smoker    Packs/day: 1.00    Years: 40.00    Types: Cigarettes    Start date: 06/23/1972    Quit date: 03/26/2012  . Smokeless tobacco: Never Used  . Alcohol use No  . Drug use: No  . Sexual activity: Yes   Other Topics Concern  . Not on file   Social History Narrative  . No narrative on file    Physical Exam  Pulmonary/Chest: No respiratory distress. She has no wheezes. She has no rales.  Abdominal: She exhibits no distension. There is no tenderness. There is no guarding.  Musculoskeletal: She exhibits no edema.  Skin: Skin is warm and dry. She is not diaphoretic.        SAFE - 05/02/16 1100      Situation   Heart failure history (P)  New   Comorbidities (P)  Atrial fibillation;COPD;DM;HTN   Readmitted within 30 days (P)  Yes   Hospital admission within past 12 months (P)  Yes   number of hospital admissions (P)  2   number of ED visits (P)  2     Assessment   Lives alone (P)  No   Primary support person (P)  sister   Mode of transportation (P)  personal car   Other services involved (P)  None   Home equipement (P)  Cane;Walker;Other     Weight   Weighs self daily (P)  No        Future Appointments Date Time Provider [place]  05/05/2016 9:00 AM [facility] PA/NP [facility] None   ATF pt CAO x4 laying in bed c/o dizziness.  Pt stated that she has been dizzy in the mornings since she has been taking her  medications after her last hospitalization.  Pt stated that the dizziness goes away around noon.  Pt stated that she eats with her meds.  Pt stated that she had been using her pill box, but the pill box that she gave me had pills that were stuffed in the slots and some were old.  Pt had about 16 pills per slot.  I emptied her pill box and filled her pill box according to epic.  Pt does not have discharge paperwork.  Pt is denies sob, headache and chest pain. Pt's vitals noted.  Pt  was advised to conticonue to take her medications and notify her physician if these symptoms worsen.  rx bottles verified and pill box filled. Pts has an up coming appointment Monday.  Pt was advised of the low sodium diet.  I also went over the zones with pt.  Pt agrees to [facility] visits.  Pt needs a scale **Called in:  carvedilol 3.125 only 15 day supply  cbg 376 BP 102/70 (BP Location: Right Arm, Patient Position: Supine, Cuff Size: Normal)   Pulse 91   Resp 16   SpO2 99%     ACTION: Home visit completed Next visit planned for next week

## 2016-05-05 ENCOUNTER — Ambulatory Visit (HOSPITAL_COMMUNITY)
Admit: 2016-05-05 | Discharge: 2016-05-05 | Disposition: A | Discharge status: Home / Self Care | Payer: Medicare Other | Attending: Cardiology | Admitting: Cardiology

## 2016-05-05 ENCOUNTER — Encounter: Payer: Self-pay | Admitting: Internal Medicine

## 2016-05-05 ENCOUNTER — Encounter (HOSPITAL_COMMUNITY): Payer: Self-pay

## 2016-05-05 VITALS — BP 148/90 | HR 92 | Wt 210.6 lb

## 2016-05-05 DIAGNOSIS — I480 Paroxysmal atrial fibrillation: Secondary | ICD-10-CM | POA: Diagnosis not present

## 2016-05-05 DIAGNOSIS — J449 Chronic obstructive pulmonary disease, unspecified: Secondary | ICD-10-CM | POA: Insufficient documentation

## 2016-05-05 DIAGNOSIS — I2510 Atherosclerotic heart disease of native coronary artery without angina pectoris: Secondary | ICD-10-CM | POA: Insufficient documentation

## 2016-05-05 DIAGNOSIS — I10 Essential (primary) hypertension: Secondary | ICD-10-CM | POA: Diagnosis not present

## 2016-05-05 DIAGNOSIS — A4151 Sepsis due to Escherichia coli [E. coli]: Secondary | ICD-10-CM

## 2016-05-05 DIAGNOSIS — Z794 Long term (current) use of insulin: Secondary | ICD-10-CM | POA: Diagnosis not present

## 2016-05-05 DIAGNOSIS — I5022 Chronic systolic (congestive) heart failure: Secondary | ICD-10-CM | POA: Diagnosis present

## 2016-05-05 DIAGNOSIS — E785 Hyperlipidemia, unspecified: Secondary | ICD-10-CM

## 2016-05-05 DIAGNOSIS — Z87891 Personal history of nicotine dependence: Secondary | ICD-10-CM | POA: Diagnosis not present

## 2016-05-05 DIAGNOSIS — E119 Type 2 diabetes mellitus without complications: Secondary | ICD-10-CM | POA: Diagnosis not present

## 2016-05-05 DIAGNOSIS — Z7901 Long term (current) use of anticoagulants: Secondary | ICD-10-CM | POA: Diagnosis not present

## 2016-05-05 DIAGNOSIS — I110 Hypertensive heart disease with heart failure: Secondary | ICD-10-CM | POA: Insufficient documentation

## 2016-05-05 DIAGNOSIS — I429 Cardiomyopathy, unspecified: Secondary | ICD-10-CM | POA: Diagnosis present

## 2016-05-05 DIAGNOSIS — Z79899 Other long term (current) drug therapy: Secondary | ICD-10-CM | POA: Diagnosis not present

## 2016-05-05 DIAGNOSIS — Z8249 Family history of ischemic heart disease and other diseases of the circulatory system: Secondary | ICD-10-CM | POA: Diagnosis not present

## 2016-05-05 DIAGNOSIS — I5042 Chronic combined systolic (congestive) and diastolic (congestive) heart failure: Secondary | ICD-10-CM

## 2016-05-05 LAB — BASIC METABOLIC PANEL
Anion gap: 7 (ref 5–15)
BUN: 20 mg/dL (ref 6–20)
CO2: 27 mmol/L (ref 22–32)
Calcium: 9.2 mg/dL (ref 8.9–10.3)
Chloride: 100 mmol/L — ABNORMAL LOW (ref 101–111)
Creatinine, Ser: 1.25 mg/dL — ABNORMAL HIGH (ref 0.44–1.00)
GFR calc Af Amer: 52 mL/min — ABNORMAL LOW (ref >60)
GFR calc non Af Amer: 45 mL/min — ABNORMAL LOW (ref >60)
Glucose, Bld: 232 mg/dL — ABNORMAL HIGH (ref 65–99)
Potassium: 4.9 mmol/L (ref 3.5–5.1)
Sodium: 134 mmol/L — ABNORMAL LOW (ref 135–145)

## 2016-05-05 LAB — BRAIN NATRIURETIC PEPTIDE: B Natriuretic Peptide: 69.9 pg/mL (ref 0.0–100.0)

## 2016-05-05 MED ORDER — SACUBITRIL-VALSARTAN 49-51 MG PO TABS: 1.0000 | ORAL_TABLET | Freq: Two times a day (BID) | ORAL | 6 refills | Status: DC

## 2016-05-05 NOTE — Patient Instructions (Signed)
Routine lab work today. Will notify you of abnormal results, otherwise no news is good news!  INCREASE Entresto to 49/51 mg tablet twice daily. Can "double up" on your current 24/26 mg tablets at home (Take 2 tabs twice daily). New Rx has been sent to your pharmacy [facility]) electronically for 49/51 mg tablets (Take 1 tab twice daily).  Follow up with Amy Clegg NP-C in 2 weeks.  Do the following things EVERYDAY: 1) Weigh yourself in the morning before breakfast. Write it down and keep it in a log. 2) Take your medicines as prescribed 3) Eat low salt foods-Limit salt (sodium) to 2000 mg per day.  4) Stay as active as you can everyday 5) Limit all fluids for the day to less than 2 liters

## 2016-05-05 NOTE — Progress Notes (Signed)
[facility] Clinic Note. Patient ID: [name], female   DOB: [birth year], 66 y.o.   MRN: [id number] PCP: Dr. Alyson Ingles Cardiology: Dr Aundra Dubin  30 yo with history of nonischemic cardiomyopathy presents for cardiology followup.  She was admitted with CHF exacerbation in 02/2012.  EF 20-25% on echo, LHC with nonobstructive CAD.  She was started on cardiac meds and discharged.  In 07/2012, she was admitted again with CHF exacerbation.  She had run out of Lasix.  She was taking her other heart medications as ordered, however.  She was diuresed and discharged. She had a chronic LBBB, and Medtronic CRT-D device was placed in 10/14.  She was admitted in 6/16 with hypertensive emergency and CHF exacerbation.  She had been out of her medications for some time.  She was very volume overloaded and was diuresed.  Cardiac meds restarted.  Echo showed EF 40-45%.    Also of note, she had PFTs in 9/16 showing restrictive spirometry with low lung volume and DLCO.  She was supposed to get a high resolution CT to evaluate for interstitial lung disease but never had the study.    She was admitted in 6/17 with chest pain.  She had been out of Lasix for a week and was actually thought to be volume overloaded.  She was ruled out for MI.  She was diuresed with IV Lasix.  She is now back on her meds.   Admitted 04/20/16-04/25/16 with septic shock secondary to E. Coli UTI and E. Coli bacteremia. TTE on 04/21/16 showed EF  reduced at 30-35% (prior Echo in 08/2015 with EF of 45-50%) thought to be secondary to sepsis vs. Noncompliance or both. Discharged on Lasix 59m BID, Entresto 24/224mBID, Bidil.   She returns today for hospital follow up. Breathing somewhat labored, got worn out walking into clinic, SOB with stairs. Not getting much activity due to SOB. 2 pillow orthopnea, no PND. Following with paramedicine. Denies dizziness and orthostasis. Drinking 2-3 bottled of water a day, eating out and not following low  sodium diet. Not weighing at home, we are providing her with a scale today. We discussed the importance of daily weights. Paramedicine reviewed with her the zones of CHF and following a fluid restricted diet. Discharge weight was 208 pounds, today's weight is 210 pounds. Taking all medications with compliance with paramedicines help.   Medtronic reviewed: volume ok, no VT/VF, avg 3-4 hours of activity a day.   Labs (2/14): SPEP negative, UPEP negative, HIV negative Labs (7/14): K 4, creatinine 1.0, LDL 66 Labs (08/25/12): K 4, creatinine 1.0 Labs (10/06/12) : K 3.3 creatinine 0.7 pro-bnp 188 Labs (11/15/12): K 4.4, creatinine 1.04, pro-BNP 468 Labs (3/15): K 4.3 Creatinine 0.83 Labs (5/15): K 4.1 Creatinine 0.85 Labs (9/16): K 4.3, creatinine 1.01 => 0.84, HCT 38.1, BNP 234 Labs (6/17): K 3.6, creatinine 0.78 Labs (3/18): K 4.6, creatinine 1.18  PMH: 1. HTN 2. Type II diabetes 3. Nonischemic cardiomyopathy: ? Due to HTN versus LBBB CMP.  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  Echo (2/14) with EF 20-25%.  Echo (7/14) with EF 25%, diffuse hypokinesis.  HIV, SPEP, UPEP negative.  Has LBBB. CRT-D 10/2012 (Medtronic).  Echo (4/15) with EF 45-50%, mild diffuse hypokinesis, PA systolic pressure 38 mmHg.  Echo (6/16) with EF 40-45%, mild LVH, septal and inferior hypokinesis.   4. Chronic LBBB 5. Right TKR 6. Bilateral THR.  7. Hyperlipidemia 8. ?COPD: Has oxygen for use with exertion.  9. Atrial fibrillation: Paroxysmal.  10. PFTs (9/16) with FEV1 77%, FVC 76%, ratio 101%, TLC 63%, DLCO 41% => moderate restrictive deficit.   SH: Prior smoker, quit 2/14.  Never drank ETOH.  No drugs. Lives with son.   FH: Mother with "heart trouble."   ROS: All systems reviewed and negative except as per HPI.   Current Outpatient Prescriptions  Medication Sig Dispense Refill  . acetaminophen (TYLENOL) 500 MG tablet Take 1 tablet (500 mg total) by mouth every 6 (six) hours as needed. (Patient taking differently:  Take 500 mg by mouth every 6 (six) hours as needed for moderate pain. ) 30 tablet 0  . albuterol (PROVENTIL HFA;VENTOLIN HFA) 108 (90 Base) MCG/ACT inhaler Inhale 2 puffs into the lungs every 6 (six) hours as needed for wheezing or shortness of breath. 18 g 0  . amiodarone (PACERONE) 200 MG tablet Amiodarone 400 mg (2 tabs) twice daily x 5 more days, then, Amiodarone 200 mg twice daily for 1 week,then. Amiodarone 200 mg daily. 120 tablet 0  . apixaban (ELIQUIS) 5 MG TABS tablet Take 1 tablet (5 mg total) by mouth 2 (two) times daily. 60 tablet 0  . atorvastatin (LIPITOR) 40 MG tablet Take 1 tablet (40 mg total) by mouth daily. 30 tablet 0  . carvedilol (COREG) 3.125 MG tablet Take 1 tablet (3.125 mg total) by mouth 2 (two) times daily with a meal. 30 tablet 0  . furosemide (LASIX) 40 MG tablet Take 1 tablet (40 mg total) by mouth 2 (two) times daily. 60 tablet 0  . glucose blood (FREESTYLE TEST STRIPS) test strip Use as instructed 100 each 0  . glucose monitoring kit (FREESTYLE) monitoring kit 1 each by Does not apply route 4 (four) times daily - after meals and at bedtime. 1 month Diabetic Testing Supplies for QAC-QHS accuchecks. 1 each 1  . insulin aspart (NOVOLOG FLEXPEN) 100 UNIT/ML FlexPen 0-20 Units, Subcutaneous, 3 times daily with meals CBG < 70: implement hypoglycemia protocol CBG 70 - 120: 0 units CBG 121 - 150: 3 units CBG 151 - 200: 4 units CBG 201 - 250: 7 units CBG 251 - 300: 11 units CBG 301 - 350: 15 units CBG 351 - 400: 20 units CBG > 400: call MD 15 mL 0  . Insulin Glargine (LANTUS SOLOSTAR) 100 UNIT/ML Solostar Pen Inject 55 Units into the skin daily. 15 mL 0  . Insulin Pen Needle 32G X 8 MM MISC Use as directed 100 each 0  . isosorbide-hydrALAZINE (BIDIL) 20-37.5 MG tablet Take 1 tablet by mouth 3 (three) times daily. 90 tablet 0  . Lancets (FREESTYLE) lancets Use as instructed 100 each 0  . metFORMIN (GLUCOPHAGE) 1000 MG tablet Take 1 tablet (1,000 mg total) by mouth 2  (two) times daily with a meal. 30 tablet 0  . potassium chloride SA (K-DUR,KLOR-CON) 20 MEQ tablet Take 1 tablet (20 mEq total) by mouth daily. 60 tablet 0  . sacubitril-valsartan (ENTRESTO) 24-26 MG Take 1 tablet by mouth 2 (two) times daily. 60 tablet 0  . spironolactone (ALDACTONE) 25 MG tablet Take 0.5 tablets (12.5 mg total) by mouth daily. 30 tablet 0  . sulfamethoxazole-trimethoprim (BACTRIM DS,SEPTRA DS) 800-160 MG tablet Take 1 tablet by mouth every 12 (twelve) hours. 12 tablet 0   No current facility-administered medications for this encounter.     Vitals:   05/05/16 0859  BP: (!) 148/90  Pulse: 92  SpO2: (!) 8%  Weight: 210 lb 9.6 oz (95.5 kg)   Physical Exam  Constitutional:  She is oriented to person, place, and time. She appears well-developed and well-nourished.  HENT:  Head: Normocephalic and atraumatic.  Eyes: Pupils are equal, round, and reactive to light.  Neck: Normal range of motion. Neck supple. No JVD present. No thyromegaly present.  Cardiovascular: Exam reveals no gallop and no friction rub.   Irregularly irregular, no murmurs.   Pulmonary/Chest: Effort normal and breath sounds normal.  Abdominal: Soft. Bowel sounds are normal.  Musculoskeletal: Normal range of motion.  Lymphadenopathy:    She has no cervical adenopathy.  Neurological: She is alert and oriented to person, place, and time.  Skin: Skin is warm and dry. No erythema.  Psychiatric: She has a normal mood and affect.     Assessment/Plan: 1. Chronic systolic CHF: Nonischemic cardiomyopathy. S/P CRT-D (Medtronic) placed 10/14. EF most recently 30-35%, reduced from prior EF of 45-50% in Aug. 2017. - NYHA class II symptoms.   - Volume looks ok on Optivol and by exam. -  Continue current Lasix.  - Increase Entresto to 49/49m BID, will get BMET and BNP today.  - Continue Bidil at current dose for now, considering increasing Bidil however reviewed paramedicine note from 05/02/16 and her BP was 102/70.   - Providing her with a scale today and discussed importance of daily weights.  - Continue 12.565mSpArlyce Harman 2. Hyperlipidemia: Denies myalgias, continue atorvastatin.  3. HTN: BP elevated today, she has taken all her morning medications.  - Increase Entresto as above.  4. Urosepsis: on last day of antibiotics today. Following with PCP.   Follow up in 2 weeks to check BMET and volume status after increasing Entresto.    ErArbutus LeasNP-C 05/05/2016

## 2016-05-06 ENCOUNTER — Other Ambulatory Visit (HOSPITAL_COMMUNITY): Payer: Self-pay

## 2016-05-06 ENCOUNTER — Telehealth (HOSPITAL_COMMUNITY): Payer: Self-pay | Admitting: Surgery

## 2016-05-06 ENCOUNTER — Encounter (HOSPITAL_COMMUNITY): Payer: Self-pay

## 2016-05-06 DIAGNOSIS — M5431 Sciatica, right side: Secondary | ICD-10-CM | POA: Diagnosis not present

## 2016-05-06 DIAGNOSIS — Z79899 Other long term (current) drug therapy: Secondary | ICD-10-CM | POA: Insufficient documentation

## 2016-05-06 DIAGNOSIS — I2510 Atherosclerotic heart disease of native coronary artery without angina pectoris: Secondary | ICD-10-CM | POA: Diagnosis not present

## 2016-05-06 DIAGNOSIS — Z87891 Personal history of nicotine dependence: Secondary | ICD-10-CM | POA: Diagnosis not present

## 2016-05-06 DIAGNOSIS — Z7901 Long term (current) use of anticoagulants: Secondary | ICD-10-CM | POA: Diagnosis not present

## 2016-05-06 DIAGNOSIS — I110 Hypertensive heart disease with heart failure: Secondary | ICD-10-CM | POA: Diagnosis not present

## 2016-05-06 DIAGNOSIS — Z794 Long term (current) use of insulin: Secondary | ICD-10-CM | POA: Insufficient documentation

## 2016-05-06 DIAGNOSIS — Z95810 Presence of automatic (implantable) cardiac defibrillator: Secondary | ICD-10-CM | POA: Diagnosis not present

## 2016-05-06 DIAGNOSIS — M25551 Pain in right hip: Secondary | ICD-10-CM | POA: Diagnosis present

## 2016-05-06 DIAGNOSIS — E119 Type 2 diabetes mellitus without complications: Secondary | ICD-10-CM | POA: Diagnosis not present

## 2016-05-06 DIAGNOSIS — Z96643 Presence of artificial hip joint, bilateral: Secondary | ICD-10-CM | POA: Insufficient documentation

## 2016-05-06 DIAGNOSIS — I5042 Chronic combined systolic (congestive) and diastolic (congestive) heart failure: Secondary | ICD-10-CM | POA: Insufficient documentation

## 2016-05-06 DIAGNOSIS — Z96651 Presence of right artificial knee joint: Secondary | ICD-10-CM | POA: Diagnosis not present

## 2016-05-06 NOTE — ED Triage Notes (Signed)
Pt brought in by EMS with chronic right hip pain; pt has hx of hip surgery; Pt a&ox 4 on arrival. Per EMS pt took pain meds with no relief; pt ambulated to triage room with assist

## 2016-05-06 NOTE — Progress Notes (Signed)
Paramedicine Encounter    Patient ID: [name], female    DOB: 07-07-1950, 66 y.o.   MRN: [id number]   Patient Care Team: [name], MD as PCP - General (Family Medicine)  Patient Active Problem List   Diagnosis Date Noted  . Paroxysmal atrial fibrillation (HCC)   . Biventricular automatic implantable cardioverter defibrillator in situ   . Sepsis ([place]) 04/20/2016  . UTI (urinary tract infection) 04/20/2016  . Dyspnea 07/19/2015  . Interstitial lung disease ([place]) 11/20/2014  . SIRS (systemic inflammatory response syndrome) ([place]) 02/03/2014  . DM (diabetes mellitus) ([place]) 02/03/2014  . Tachycardia 06/14/2013  . Chronic combined systolic and diastolic CHF (congestive heart failure) ([place]) 09/29/2012  . Hypoxia 03/06/2012  . Hypertensive heart disease 03/06/2012  . Nonischemic cardiomyopathy ([place]) 03/06/2012  . Tobacco abuse 03/06/2012  . Type 2 diabetes mellitus ([place]) 03/06/2012  . Hypertension 03/06/2012  . CAD (coronary artery disease), native coronary artery 03/06/2012  . Acute on chronic combined systolic and diastolic heart failure ([place]) 03/06/2012  . Hypokalemia 03/06/2012  . Acute bronchitis 02/01/2009  . Sleep apnea 02/01/2009  . Chest pain 02/01/2009    Current Outpatient Prescriptions:  .  acetaminophen (TYLENOL) 500 MG tablet, Take 1 tablet (500 mg total) by mouth every 6 (six) hours as needed. (Patient taking differently: Take 500 mg by mouth every 6 (six) hours as needed for moderate pain. ), Disp: 30 tablet, Rfl: 0 .  albuterol (PROVENTIL HFA;VENTOLIN HFA) 108 (90 Base) MCG/ACT inhaler, Inhale 2 puffs into the lungs every 6 (six) hours as needed for wheezing or shortness of breath., Disp: 18 g, Rfl: 0 .  amiodarone (PACERONE) 200 MG tablet, Amiodarone 400 mg (2 tabs) twice daily x 5 more days, then, Amiodarone 200 mg twice daily for 1 week,then. Amiodarone 200 mg daily., Disp: 120 tablet, Rfl: 0 .  apixaban (ELIQUIS) 5 MG TABS tablet, Take 1 tablet (5 mg  total) by mouth 2 (two) times daily., Disp: 60 tablet, Rfl: 0 .  atorvastatin (LIPITOR) 40 MG tablet, Take 1 tablet (40 mg total) by mouth daily., Disp: 30 tablet, Rfl: 0 .  carvedilol (COREG) 3.125 MG tablet, Take 1 tablet (3.125 mg total) by mouth 2 (two) times daily with a meal., Disp: 30 tablet, Rfl: 0 .  furosemide (LASIX) 40 MG tablet, Take 1 tablet (40 mg total) by mouth 2 (two) times daily., Disp: 60 tablet, Rfl: 0 .  glucose blood (FREESTYLE TEST STRIPS) test strip, Use as instructed, Disp: 100 each, Rfl: 0 .  glucose monitoring kit (FREESTYLE) monitoring kit, 1 each by Does not apply route 4 (four) times daily - after meals and at bedtime. 1 month Diabetic Testing Supplies for QAC-QHS accuchecks., Disp: 1 each, Rfl: 1 .  insulin aspart (NOVOLOG FLEXPEN) 100 UNIT/ML FlexPen, 0-20 Units, Subcutaneous, 3 times daily with meals CBG < 70: implement hypoglycemia protocol CBG 70 - 120: 0 units CBG 121 - 150: 3 units CBG 151 - 200: 4 units CBG 201 - 250: 7 units CBG 251 - 300: 11 units CBG 301 - 350: 15 units CBG 351 - 400: 20 units CBG > 400: call MD, Disp: 15 mL, Rfl: 0 .  Insulin Glargine (LANTUS SOLOSTAR) 100 UNIT/ML Solostar Pen, Inject 55 Units into the skin daily., Disp: 15 mL, Rfl: 0 .  Insulin Pen Needle 32G X 8 MM MISC, Use as directed, Disp: 100 each, Rfl: 0 .  isosorbide-hydrALAZINE (BIDIL) 20-37.5 MG tablet, Take 1 tablet by mouth 3 (three) times daily., Disp:  90 tablet, Rfl: 0 .  Lancets (FREESTYLE) lancets, Use as instructed, Disp: 100 each, Rfl: 0 .  metFORMIN (GLUCOPHAGE) 1000 MG tablet, Take 1 tablet (1,000 mg total) by mouth 2 (two) times daily with a meal., Disp: 30 tablet, Rfl: 0 .  potassium chloride SA (K-DUR,KLOR-CON) 20 MEQ tablet, Take 1 tablet (20 mEq total) by mouth daily., Disp: 60 tablet, Rfl: 0 .  sacubitril-valsartan (ENTRESTO) 49-51 MG, Take 1 tablet by mouth 2 (two) times daily., Disp: 60 tablet, Rfl: 6 .  spironolactone (ALDACTONE) 25 MG tablet, Take 0.5 tablets  (12.5 mg total) by mouth daily., Disp: 30 tablet, Rfl: 0 .  sulfamethoxazole-trimethoprim (BACTRIM DS,SEPTRA DS) 800-160 MG tablet, Take 1 tablet by mouth every 12 (twelve) hours., Disp: 12 tablet, Rfl: 0 Allergies  Allergen Reactions  . Morphine And Related Nausea And Vomiting and Other (See Comments)    Severe nausea     Social History   Social History  . Marital status: Single    Spouse name: N/A  . Number of children: N/A  . Years of education: N/A   Occupational History  . Not on file.   Social History Main Topics  . Smoking status: Former Smoker    Packs/day: 1.00    Years: 40.00    Types: Cigarettes    Start date: 06/23/1972    Quit date: 03/26/2012  . Smokeless tobacco: Never Used  . Alcohol use No  . Drug use: No  . Sexual activity: Yes   Other Topics Concern  . Not on file   Social History Narrative  . No narrative on file    Physical Exam      SAFE - 05/02/16 1100      Situation   Heart failure history New   Comorbidities Atrial fibillation;COPD;DM;HTN   Readmitted within 30 days Yes   Hospital admission within past 12 months Yes   number of hospital admissions 2   number of ED visits 2     Assessment   Lives alone No   Primary support person sister   Mode of transportation personal car   Other services involved None   Home equipement Cane;Walker;Other     Weight   Weighs self daily No   Scale provided No   Records on weight chart No     Resources   Has "Living better w/heart failure" book Yes   Has HF Zone tool Yes   Able to identify yellow zone signs/when to call MD Yes   Records zone daily No     Medications   Uses a pill box Yes   Who stocks the pill box [facility]   Pill box checked this visit Yes   Pill box refilled this visit Yes   Difficulty obtaining medications No   Mail order medications No   Missed one or more doses of medications per week No     Nutrition   Patient receives meals on wheels No   Patient follows low sodium  diet Yes   Has foods at home that meet the current recommended diet Yes   Patient follows low sugar/card diet Yes     Activity Level   ADL's/Mobility Independent   How many feet can patient ambulate 75   Barriers knee pain at times   Actvity tolerance: NYHA Class 3     Urine   Difficulty urinating No   Changes in urine None     Time spent with patient   Time spent with patient  [place] - 05/04/16 0300      Outside of House   Sidewalk and pathway to house is level and free from any hazards Yes   Driveway is free from debris/snow/ice Yes   Outside stairs are stable and have sturdy handrail Yes   Porch lights are working and provide adequate lighting Yes     Living Room   Furniture is of adequate height and offers arm rests that assist in getting up and down Yes   Floor is free from any clutter that would create tripping hazards Yes   All cords are either behind furniture or secured in a manner that does not cause trip hazards Yes   All rugs are secured to floor with double-sided tape Yes   Lighting is adequate to light room Yes   All lighting has an easily accessible on/off switch Yes   Phone is readily accessible near favorite seating areas Yes   Emergency numbers are printed near all phones in house No     Kitchen   Items used most often are within easy reach on low shelves Yes   Step stool is present, is sturdy and has a handrail N/A   Floor mats are non-slip tread and secured to floor Yes   Oven controls are within easy reach Yes   Kitchen lighting is adequate and easy to reach switches Yes   ABC fire extinguisher is located in kitchen No     [place] is properly secured to stairs and/or all wood is properly secured N/A   Handrail is present and sturdy N/A   Stairs are free from any clutter N/A   Stairway is adequately lit N/A     Bathroom   Tub and shower have a non-slip surface Yes   Tub and/or shower  have a grab bar for stability Yes   Toilet has a raised seat No  pt has bed side commode   Grab bar is attached near toilet for assistance Yes   Pathway from bedroom to bathroom is free from clutter and well lit for ease of movement in the middle of the night Yes     Bedroom   Floor is free from clutter Yes   Light is near bed and is easy to turn on Yes   Phone is next to bed and within easy reach Yes   Flashlight is near bed in case of emergency No     General   Smoke detectors in all areas of the house (each floor) and tested Yes   CO detectors on each floor of house and tested Yes   Flashlights are handy throughout the home No   All heaters are away from any type of flammable material Yes     Overall Tips   Homeowner ha good non-skid shoes to move around house Yes   All assisted walking devices are readily accessible and in good condition Yes   There is a phone near the floor for ease of reach in case of a fall YES   All O2 tubing is less than 50 ft. and is not a trip hazard N/A   Resident has had an annual hearing and vision check by a physician No   Resident has the proper hearing and visual aids prescribed and are in good working order Yes   All medications are properly stored and labeled to avoid confusion on dosage, time  to take, and avoidance of missed doses Yes       Future Appointments Date Time Provider [place]  05/20/2016 9:00 AM [facility] PA/NP [facility] None   Pt called heart clinic and requested that I "come and show her how to take her medication". Pt was prescribed novolog and didn't know how to take it.  I advised pt that [facility] paramedics are not allowed to assist in insulin administration.  I called her pharmacy and they explained to her how to take her medications.  I made a larger copy of the blood sugar ranges and units so pt could read the paper.  Pt has no complaints.     ACTION: Home visit completed

## 2016-05-06 NOTE — Telephone Encounter (Signed)
[name] called requesting some assistance from [facility] Paramedic with her medications and changes that were made yesterday.  She tells me that she is confused and wants to be sure that she is "taking everything right".  I contacted [facility] [employer] and she agrees to make a home visit to clarify medications and assist with [name]'s pillbox.  [name] made aware and very grateful for assistance.

## 2016-05-07 ENCOUNTER — Emergency Department (HOSPITAL_COMMUNITY)
Admission: EM | Admit: 2016-05-07 | Discharge: 2016-05-07 | Disposition: A | Discharge status: Home / Self Care | Payer: Medicare Other | Attending: Emergency Medicine | Admitting: Emergency Medicine

## 2016-05-07 DIAGNOSIS — M5431 Sciatica, right side: Secondary | ICD-10-CM | POA: Diagnosis not present

## 2016-05-07 MED ORDER — OXYCODONE-ACETAMINOPHEN 5-325 MG PO TABS
1.0000 | ORAL_TABLET | Freq: Once | ORAL | Status: AC
  Administered 2016-05-07: 1 via ORAL
  Filled 2016-05-07: qty 1

## 2016-05-07 MED ORDER — OXYCODONE-ACETAMINOPHEN 5-325 MG PO TABS: 1.0000 | ORAL_TABLET | Freq: Four times a day (QID) | ORAL | 0 refills | Status: DC | PRN

## 2016-05-07 MED ORDER — IBUPROFEN 600 MG PO TABS: 600.0000 mg | ORAL_TABLET | Freq: Four times a day (QID) | ORAL | 0 refills | Status: DC | PRN

## 2016-05-07 NOTE — ED Notes (Signed)
Pt stable, understands discharge instructions, and reasons for return.   

## 2016-05-07 NOTE — ED Notes (Signed)
Pt c/o right hip pain sp surgery. See providers assessment.

## 2016-05-08 ENCOUNTER — Other Ambulatory Visit (HOSPITAL_COMMUNITY): Payer: Self-pay

## 2016-05-08 NOTE — Progress Notes (Signed)
Paramedicine Encounter   Patient ID: [name] , female,   DOB: [birth year],65 y.o.,  MRN: [id number]   Met patient to replace entresto 24-26 mg with entresto 49-51 mg in her pill box.    [place] Neetu Carrozza, EMT-Paramedic 05/07/16   ACTION: Home visit completed

## 2016-05-09 ENCOUNTER — Other Ambulatory Visit (HOSPITAL_COMMUNITY): Payer: Self-pay

## 2016-05-09 NOTE — Progress Notes (Signed)
Paramedicine Encounter    Patient ID: [name], female    DOB: 1950-02-09, 66 y.o.   MRN: [id number]    Patient Care Team: [name], MD as PCP - General (Family Medicine)  Patient Active Problem List   Diagnosis Date Noted  . Paroxysmal atrial fibrillation (HCC)   . Biventricular automatic implantable cardioverter defibrillator in situ   . Sepsis ([place]) 04/20/2016  . UTI (urinary tract infection) 04/20/2016  . Dyspnea 07/19/2015  . Interstitial lung disease ([place]) 11/20/2014  . SIRS (systemic inflammatory response syndrome) ([place]) 02/03/2014  . DM (diabetes mellitus) ([place]) 02/03/2014  . Tachycardia 06/14/2013  . Chronic combined systolic and diastolic CHF (congestive heart failure) ([place]) 09/29/2012  . Hypoxia 03/06/2012  . Hypertensive heart disease 03/06/2012  . Nonischemic cardiomyopathy ([place]) 03/06/2012  . Tobacco abuse 03/06/2012  . Type 2 diabetes mellitus ([place]) 03/06/2012  . Hypertension 03/06/2012  . CAD (coronary artery disease), native coronary artery 03/06/2012  . Acute on chronic combined systolic and diastolic heart failure ([place]) 03/06/2012  . Hypokalemia 03/06/2012  . Acute bronchitis 02/01/2009  . Sleep apnea 02/01/2009  . Chest pain 02/01/2009    Current Outpatient Prescriptions:  .  acetaminophen (TYLENOL) 500 MG tablet, Take 1 tablet (500 mg total) by mouth every 6 (six) hours as needed. (Patient taking differently: Take 500 mg by mouth every 6 (six) hours as needed for moderate pain. ), Disp: 30 tablet, Rfl: 0 .  albuterol (PROVENTIL HFA;VENTOLIN HFA) 108 (90 Base) MCG/ACT inhaler, Inhale 2 puffs into the lungs every 6 (six) hours as needed for wheezing or shortness of breath., Disp: 18 g, Rfl: 0 .  amiodarone (PACERONE) 200 MG tablet, Amiodarone 400 mg (2 tabs) twice daily x 5 more days, then, Amiodarone 200 mg twice daily for 1 week,then. Amiodarone 200 mg daily., Disp: 120 tablet, Rfl: 0 .  apixaban (ELIQUIS) 5 MG TABS tablet, Take 1 tablet (5 mg  total) by mouth 2 (two) times daily., Disp: 60 tablet, Rfl: 0 .  atorvastatin (LIPITOR) 40 MG tablet, Take 1 tablet (40 mg total) by mouth daily., Disp: 30 tablet, Rfl: 0 .  carvedilol (COREG) 3.125 MG tablet, Take 1 tablet (3.125 mg total) by mouth 2 (two) times daily with a meal., Disp: 30 tablet, Rfl: 0 .  furosemide (LASIX) 40 MG tablet, Take 1 tablet (40 mg total) by mouth 2 (two) times daily., Disp: 60 tablet, Rfl: 0 .  glucose blood (FREESTYLE TEST STRIPS) test strip, Use as instructed, Disp: 100 each, Rfl: 0 .  ibuprofen (ADVIL,MOTRIN) 600 MG tablet, Take 1 tablet (600 mg total) by mouth every 6 (six) hours as needed., Disp: 30 tablet, Rfl: 0 .  insulin aspart (NOVOLOG FLEXPEN) 100 UNIT/ML FlexPen, 0-20 Units, Subcutaneous, 3 times daily with meals CBG < 70: implement hypoglycemia protocol CBG 70 - 120: 0 units CBG 121 - 150: 3 units CBG 151 - 200: 4 units CBG 201 - 250: 7 units CBG 251 - 300: 11 units CBG 301 - 350: 15 units CBG 351 - 400: 20 units CBG > 400: call MD, Disp: 15 mL, Rfl: 0 .  Insulin Glargine (LANTUS SOLOSTAR) 100 UNIT/ML Solostar Pen, Inject 55 Units into the skin daily., Disp: 15 mL, Rfl: 0 .  Insulin Pen Needle 32G X 8 MM MISC, Use as directed, Disp: 100 each, Rfl: 0 .  isosorbide-hydrALAZINE (BIDIL) 20-37.5 MG tablet, Take 1 tablet by mouth 3 (three) times daily., Disp: 90 tablet, Rfl: 0 .  Lancets (FREESTYLE) lancets, Use as  instructed, Disp: 100 each, Rfl: 0 .  metFORMIN (GLUCOPHAGE) 1000 MG tablet, Take 1 tablet (1,000 mg total) by mouth 2 (two) times daily with a meal., Disp: 30 tablet, Rfl: 0 .  potassium chloride SA (K-DUR,KLOR-CON) 20 MEQ tablet, Take 1 tablet (20 mEq total) by mouth daily., Disp: 60 tablet, Rfl: 0 .  sacubitril-valsartan (ENTRESTO) 49-51 MG, Take 1 tablet by mouth 2 (two) times daily., Disp: 60 tablet, Rfl: 6 .  spironolactone (ALDACTONE) 25 MG tablet, Take 0.5 tablets (12.5 mg total) by mouth daily., Disp: 30 tablet, Rfl: 0 .  glucose monitoring  kit (FREESTYLE) monitoring kit, 1 each by Does not apply route 4 (four) times daily - after meals and at bedtime. 1 month Diabetic Testing Supplies for QAC-QHS accuchecks., Disp: 1 each, Rfl: 1 .  oxyCODONE-acetaminophen (PERCOCET/ROXICET) 5-325 MG tablet, Take 1 tablet by mouth every 6 (six) hours as needed for severe pain., Disp: 8 tablet, Rfl: 0 .  sulfamethoxazole-trimethoprim (BACTRIM DS,SEPTRA DS) 800-160 MG tablet, Take 1 tablet by mouth every 12 (twelve) hours. (Patient not taking: Reported on 05/09/2016), Disp: 12 tablet, Rfl: 0 Allergies  Allergen Reactions  . Morphine And Related Nausea And Vomiting and Other (See Comments)    Severe nausea     Social History   Social History  . Marital status: Single    Spouse name: N/A  . Number of children: N/A  . Years of education: N/A   Occupational History  . Not on file.   Social History Main Topics  . Smoking status: Former Smoker    Packs/day: 1.00    Years: 40.00    Types: Cigarettes    Start date: 06/23/1972    Quit date: 03/26/2012  . Smokeless tobacco: Never Used  . Alcohol use No  . Drug use: No  . Sexual activity: Yes   Other Topics Concern  . Not on file   Social History Narrative  . No narrative on file    Physical Exam  Pulmonary/Chest: No respiratory distress. She has no wheezes. She has no rales.  Abdominal: She exhibits no distension. There is no tenderness. There is no guarding.  Musculoskeletal: She exhibits no edema.  Skin: Skin is warm and dry. She is not diaphoretic.        [facility]FE - 05/02/16 1100      Situation   Heart failure history New   Comorbidities Atrial fibillation;COPD;DM;HTN   Readmitted within 30 days Yes   Hospital admission within past 12 months Yes   number of hospital admissions 2   number of ED visits 2     Assessment   Lives alone No   Primary support person sister   Mode of transportation personal car   Other services involved None   Home equipement Cane;Walker;Other      Weight   Weighs self daily No   Scale provided No   Records on weight chart No     Resources   Has "Living better w/heart failure" book Yes   Has HF Zone tool Yes   Able to identify yellow zone signs/when to call MD Yes   Records zone daily No     Medications   Uses a pill box Yes   Who stocks the pill box [facility]   Pill box checked this visit Yes   Pill box refilled this visit Yes   Difficulty obtaining medications No   Mail order medications No   Missed one or more doses of medications per week No  Nutrition   Patient receives meals on wheels No   Patient follows low sodium diet Yes   Has foods at home that meet the current recommended diet Yes   Patient follows low sugar/card diet Yes     Activity Level   ADL's/Mobility Independent   How many feet can patient ambulate 75   Barriers knee pain at times   Actvity tolerance: NYHA Class 3     Urine   Difficulty urinating No   Changes in urine None     Time spent with patient   Time spent with patient  50 Minutes        Future Appointments Date Time Provider [place]  05/20/2016 9:00 AM [facility] PA/NP [facility] None    ATF pt CAO x4 sitting on the porch c/o dizziness. Pt stated that since she is taking her medications she has had dizziness.  Pt states that it goes away In the afternoon.  Pt denies sob, headaches, and chest pain.  Pt is taking her medications accordingly without difficulty. rx bottles verified and pill box refilled.  Pt is out of carvediol from sun-thurs, the rx was called in last visit (awaiting for physician approval).   **Called in: carvediol (sun-thurs empty ) Metformin full  BP 102/70 (BP Location: Left Arm, Patient Position: Sitting)   Pulse 88   Resp (!) 94   Wt 210 lb (95.3 kg)   BMI 32.89 kg/m   cbg 277 Weight yesterday-210 Last visit weight-208.8    Lucyle Alumbaugh, EMT Paramedic 05/09/2016    ACTION: Home visit completed

## 2016-05-16 ENCOUNTER — Other Ambulatory Visit (HOSPITAL_COMMUNITY): Payer: Self-pay

## 2016-05-16 NOTE — Progress Notes (Signed)
Paramedicine Encounter    Patient ID: [name], female    DOB: [birth year], 66 y.o.   MRN: [id number]    Patient Care Team: [name], MD as PCP - General (Family Medicine)  Patient Active Problem List   Diagnosis Date Noted  . Paroxysmal atrial fibrillation (HCC)   . Biventricular automatic implantable cardioverter defibrillator in situ   . Sepsis ([place]) 04/20/2016  . UTI (urinary tract infection) 04/20/2016  . Dyspnea 07/19/2015  . Interstitial lung disease ([place]) 11/20/2014  . SIRS (systemic inflammatory response syndrome) ([place]) 02/03/2014  . DM (diabetes mellitus) ([place]) 02/03/2014  . Tachycardia 06/14/2013  . Chronic combined systolic and diastolic CHF (congestive heart failure) ([place]) 09/29/2012  . Hypoxia 03/06/2012  . Hypertensive heart disease 03/06/2012  . Nonischemic cardiomyopathy ([place]) 03/06/2012  . Tobacco abuse 03/06/2012  . Type 2 diabetes mellitus ([place]) 03/06/2012  . Hypertension 03/06/2012  . CAD (coronary artery disease), native coronary artery 03/06/2012  . Acute on chronic combined systolic and diastolic heart failure ([place]) 03/06/2012  . Hypokalemia 03/06/2012  . Acute bronchitis 02/01/2009  . Sleep apnea 02/01/2009  . Chest pain 02/01/2009    Current Outpatient Prescriptions:  .  acetaminophen (TYLENOL) 500 MG tablet, Take 1 tablet (500 mg total) by mouth every 6 (six) hours as needed. (Patient taking differently: Take 500 mg by mouth every 6 (six) hours as needed for moderate pain. ), Disp: 30 tablet, Rfl: 0 .  albuterol (PROVENTIL HFA;VENTOLIN HFA) 108 (90 Base) MCG/ACT inhaler, Inhale 2 puffs into the lungs every 6 (six) hours as needed for wheezing or shortness of breath., Disp: 18 g, Rfl: 0 .  amiodarone (PACERONE) 200 MG tablet, Amiodarone 400 mg (2 tabs) twice daily x 5 more days, then, Amiodarone 200 mg twice daily for 1 week,then. Amiodarone 200 mg daily., Disp: 120 tablet, Rfl: 0 .  apixaban (ELIQUIS) 5 MG TABS tablet, Take 1 tablet (5 mg  total) by mouth 2 (two) times daily., Disp: 60 tablet, Rfl: 0 .  atorvastatin (LIPITOR) 40 MG tablet, Take 1 tablet (40 mg total) by mouth daily., Disp: 30 tablet, Rfl: 0 .  carvedilol (COREG) 3.125 MG tablet, Take 1 tablet (3.125 mg total) by mouth 2 (two) times daily with a meal., Disp: 30 tablet, Rfl: 0 .  furosemide (LASIX) 40 MG tablet, Take 1 tablet (40 mg total) by mouth 2 (two) times daily., Disp: 60 tablet, Rfl: 0 .  glucose blood (FREESTYLE TEST STRIPS) test strip, Use as instructed, Disp: 100 each, Rfl: 0 .  glucose monitoring kit (FREESTYLE) monitoring kit, 1 each by Does not apply route 4 (four) times daily - after meals and at bedtime. 1 month Diabetic Testing Supplies for QAC-QHS accuchecks., Disp: 1 each, Rfl: 1 .  ibuprofen (ADVIL,MOTRIN) 600 MG tablet, Take 1 tablet (600 mg total) by mouth every 6 (six) hours as needed., Disp: 30 tablet, Rfl: 0 .  insulin aspart (NOVOLOG FLEXPEN) 100 UNIT/ML FlexPen, 0-20 Units, Subcutaneous, 3 times daily with meals CBG < 70: implement hypoglycemia protocol CBG 70 - 120: 0 units CBG 121 - 150: 3 units CBG 151 - 200: 4 units CBG 201 - 250: 7 units CBG 251 - 300: 11 units CBG 301 - 350: 15 units CBG 351 - 400: 20 units CBG > 400: call MD, Disp: 15 mL, Rfl: 0 .  Insulin Glargine (LANTUS SOLOSTAR) 100 UNIT/ML Solostar Pen, Inject 55 Units into the skin daily., Disp: 15 mL, Rfl: 0 .  Insulin Pen Needle 32G X 8  MM MISC, Use as directed, Disp: 100 each, Rfl: 0 .  isosorbide-hydrALAZINE (BIDIL) 20-37.5 MG tablet, Take 1 tablet by mouth 3 (three) times daily., Disp: 90 tablet, Rfl: 0 .  Lancets (FREESTYLE) lancets, Use as instructed, Disp: 100 each, Rfl: 0 .  metFORMIN (GLUCOPHAGE) 1000 MG tablet, Take 1 tablet (1,000 mg total) by mouth 2 (two) times daily with a meal., Disp: 30 tablet, Rfl: 0 .  oxyCODONE-acetaminophen (PERCOCET/ROXICET) 5-325 MG tablet, Take 1 tablet by mouth every 6 (six) hours as needed for severe pain., Disp: 8 tablet, Rfl: 0 .   potassium chloride SA (K-DUR,KLOR-CON) 20 MEQ tablet, Take 1 tablet (20 mEq total) by mouth daily., Disp: 60 tablet, Rfl: 0 .  sacubitril-valsartan (ENTRESTO) 49-51 MG, Take 1 tablet by mouth 2 (two) times daily., Disp: 60 tablet, Rfl: 6 .  spironolactone (ALDACTONE) 25 MG tablet, Take 0.5 tablets (12.5 mg total) by mouth daily., Disp: 30 tablet, Rfl: 0 .  sulfamethoxazole-trimethoprim (BACTRIM DS,SEPTRA DS) 800-160 MG tablet, Take 1 tablet by mouth every 12 (twelve) hours. (Patient not taking: Reported on 05/09/2016), Disp: 12 tablet, Rfl: 0 Allergies  Allergen Reactions  . Morphine And Related Nausea And Vomiting and Other (See Comments)    Severe nausea     Social History   Social History  . Marital status: Single    Spouse name: N/A  . Number of children: N/A  . Years of education: N/A   Occupational History  . Not on file.   Social History Main Topics  . Smoking status: Former Smoker    Packs/day: 1.00    Years: 40.00    Types: Cigarettes    Start date: 06/23/1972    Quit date: 03/26/2012  . Smokeless tobacco: Never Used  . Alcohol use No  . Drug use: No  . Sexual activity: Yes   Other Topics Concern  . Not on file   Social History Narrative  . No narrative on file    Physical Exam  Pulmonary/Chest: No respiratory distress. She has no wheezes. She has no rales.  Abdominal: She exhibits no distension. There is no tenderness. There is no guarding.  Musculoskeletal: She exhibits no edema.  Skin: Skin is warm and dry.        SAFE - 05/02/16 1100      Situation   Heart failure history New   Comorbidities Atrial fibillation;COPD;DM;HTN   Readmitted within 30 days Yes   Hospital admission within past 12 months Yes   number of hospital admissions 2   number of ED visits 2     Assessment   Lives alone No   Primary support person sister   Mode of transportation personal car   Other services involved None   Home equipement Cane;Walker;Other     Weight    Weighs self daily No   Scale provided No   Records on weight chart No     Resources   Has "Living better w/heart failure" book Yes   Has HF Zone tool Yes   Able to identify yellow zone signs/when to call MD Yes   Records zone daily No     Medications   Uses a pill box Yes   Who stocks the pill box [facility]   Pill box checked this visit Yes   Pill box refilled this visit Yes   Difficulty obtaining medications No   Mail order medications No   Missed one or more doses of medications per week No     Nutrition  Patient receives meals on wheels No   Patient follows low sodium diet Yes   Has foods at home that meet the current recommended diet Yes   Patient follows low sugar/card diet Yes     Activity Level   ADL's/Mobility Independent   How many feet can patient ambulate 75   Barriers knee pain at times   Actvity tolerance: NYHA Class 3     Urine   Difficulty urinating No   Changes in urine None     Time spent with patient   Time spent with patient  17 Minutes        Future Appointments Date Time Provider [place]  05/20/2016 9:00 AM [facility] PA/NP [facility] None    ATF pt CAO x4 laying across her bed.  Pt stated that she is just "just now getting over the dizziness".  Pt has no other complaints today. She has taken all of her medications besides one dose on tues afternoon.  Pt was advised to watch her diet due to her eating foods such as tomatoes.  Pt is still working hard on keeping her cbg within normal limits and not drinking more than 2 liter of fluid.  Pt has an upcoming appointment next week and stated that she will be able to make it.  rx bottles verified and pill box refilled.     **carvediol filled til mondayeveing/ noe in tues morn Metformin filled until tues mornin/none in tues evenin Both waitig for primary approval **rx called in; Atorvastatin furososemide eliquis  BP 122/74 (BP Location: Right Arm, Patient Position: Sitting, Cuff Size: Normal)    Pulse 90   Resp 16   Wt 211 lb (95.7 kg)   SpO2 98%   BMI 33.05 kg/m   cbg 382 Weight yesterday-pt didn't under the numbers Last visit weight-210    Elisabet Gutzmer, EMT Paramedic 05/16/2016    ACTION: Home visit completed

## 2016-05-18 NOTE — ED Provider Notes (Signed)
[facility] DEPT Provider Note   CSN: [id number] Arrival date & time: 05/06/16  2258     History   Chief Complaint Chief Complaint  Patient presents with  . Hip Pain    HPI [name] is a 66 y.o. female.  Patient presents with complaint of right hip pain that she states has been there since hip replacement in the past. No recent procedures. She takes tramadol at home and reports this is not providing relief. No fall or injury. Per patient, pain is typical of her chronic pain but is more intense today.   The history is provided by the patient. No language interpreter was used.  Hip Pain  Pertinent negatives include no abdominal pain.    Past Medical History:  Diagnosis Date  . Anemia    a. Noted on 07/2012 labs, instructed to f/u PCP.  Marland Kitchen Arthritis    "joints" (11/18/2012)  . Automatic implantable cardioverter-defibrillator in situ   . CAD (coronary artery disease), native coronary artery    a. Nonobstructive by cath 02/2012 (done because of low EF).  . Chronic bronchitis ([place])    "~ every other year" (11/18/2012)  . Chronic combined systolic and diastolic CHF (congestive heart failure) ([place])    a. 03/05/12 echo:  LVEF 20-25%, moderate LVH , inferior and basal to mid septal akinesis, anterior moderate to severe hypokinesis and grade 2 diastolic dysfunction. b. EF 07/2012: EF still 25% (unclear medication compliance).  . Chronic lower back pain   . Headache(784.0)    "often; maybe not daily" (11/18/2012)  . High cholesterol   . History of noncompliance with medical treatment   . Hypertension   . LBBB (left bundle branch block)   . Orthopnea   . Tobacco abuse   . Type II diabetes mellitus [facility])     Patient Active Problem List   Diagnosis Date Noted  . Paroxysmal atrial fibrillation (HCC)   . Biventricular automatic implantable cardioverter defibrillator in situ   . Sepsis ([place]) 04/20/2016  . UTI (urinary tract infection) 04/20/2016  . Dyspnea 07/19/2015  .  Interstitial lung disease ([place]) 11/20/2014  . SIRS (systemic inflammatory response syndrome) ([place]) 02/03/2014  . DM (diabetes mellitus) ([place]) 02/03/2014  . Tachycardia 06/14/2013  . Chronic combined systolic and diastolic CHF (congestive heart failure) ([place]) 09/29/2012  . Hypoxia 03/06/2012  . Hypertensive heart disease 03/06/2012  . Nonischemic cardiomyopathy ([place]) 03/06/2012  . Tobacco abuse 03/06/2012  . Type 2 diabetes mellitus ([place]) 03/06/2012  . Hypertension 03/06/2012  . CAD (coronary artery disease), native coronary artery 03/06/2012  . Acute on chronic combined systolic and diastolic heart failure ([place]) 03/06/2012  . Hypokalemia 03/06/2012  . Acute bronchitis 02/01/2009  . Sleep apnea 02/01/2009  . Chest pain 02/01/2009    Past Surgical History:  Procedure Laterality Date  . BI-VENTRICULAR IMPLANTABLE CARDIOVERTER DEFIBRILLATOR N/A 11/18/2012   Procedure: BI-VENTRICULAR IMPLANTABLE CARDIOVERTER DEFIBRILLATOR  (CRT-D);  Surgeon: Evans Lance, MD;  Location: [facility] CATH LAB;  Service: Cardiovascular;  Laterality: N/A;  . BI-VENTRICULAR IMPLANTABLE CARDIOVERTER DEFIBRILLATOR  (CRT-D)  11/18/2012  . CARDIAC CATHETERIZATION  03/04/12   nonobstructive CAD, elevated LVEDP and tortuous vessels suggestive of long-standing hypertension  . JOINT REPLACEMENT     Bilateral hip and right knee  . LEFT HEART CATH N/A 03/05/2012   Procedure: LEFT HEART CATH;  Surgeon: Larey Dresser, MD;  Location: [facility] CATH LAB;  Service: Cardiovascular;  Laterality: N/A;    OB History    No data available  Home Medications    Prior to Admission medications   Medication Sig Start Date End Date Taking? Authorizing Provider  acetaminophen (TYLENOL) 500 MG tablet Take 1 tablet (500 mg total) by mouth every 6 (six) hours as needed. Patient taking differently: Take 500 mg by mouth every 6 (six) hours as needed for moderate pain.  09/15/15   Leo Grosser, MD  albuterol (PROVENTIL HFA;VENTOLIN HFA) 108 (90  Base) MCG/ACT inhaler Inhale 2 puffs into the lungs every 6 (six) hours as needed for wheezing or shortness of breath. 04/25/16   Shanker Kristeen Mans, MD  amiodarone (PACERONE) 200 MG tablet Amiodarone 400 mg (2 tabs) twice daily x 5 more days, then, Amiodarone 200 mg twice daily for 1 week,then. Amiodarone 200 mg daily. 04/25/16   Shanker Kristeen Mans, MD  apixaban (ELIQUIS) 5 MG TABS tablet Take 1 tablet (5 mg total) by mouth 2 (two) times daily. 04/25/16   Shanker Kristeen Mans, MD  atorvastatin (LIPITOR) 40 MG tablet Take 1 tablet (40 mg total) by mouth daily. 04/25/16   Shanker Kristeen Mans, MD  carvedilol (COREG) 3.125 MG tablet Take 1 tablet (3.125 mg total) by mouth 2 (two) times daily with a meal. 04/25/16   Shanker Kristeen Mans, MD  furosemide (LASIX) 40 MG tablet Take 1 tablet (40 mg total) by mouth 2 (two) times daily. 04/25/16   Shanker Kristeen Mans, MD  glucose blood (FREESTYLE TEST STRIPS) test strip Use as instructed 04/25/16   Jonetta Osgood, MD  glucose monitoring kit (FREESTYLE) monitoring kit 1 each by Does not apply route 4 (four) times daily - after meals and at bedtime. 1 month Diabetic Testing Supplies for QAC-QHS accuchecks. 04/25/16   Shanker Kristeen Mans, MD  ibuprofen (ADVIL,MOTRIN) 600 MG tablet Take 1 tablet (600 mg total) by mouth every 6 (six) hours as needed. 05/07/16   Charlann Lange, PA-C  insulin aspart (NOVOLOG FLEXPEN) 100 UNIT/ML FlexPen 0-20 Units, Subcutaneous, 3 times daily with meals CBG < 70: implement hypoglycemia protocol CBG 70 - 120: 0 units CBG 121 - 150: 3 units CBG 151 - 200: 4 units CBG 201 - 250: 7 units CBG 251 - 300: 11 units CBG 301 - 350: 15 units CBG 351 - 400: 20 units CBG > 400: call MD 04/25/16   Jonetta Osgood, MD  Insulin Glargine (LANTUS SOLOSTAR) 100 UNIT/ML Solostar Pen Inject 55 Units into the skin daily. 04/25/16   Shanker Kristeen Mans, MD  Insulin Pen Needle 32G X 8 MM MISC Use as directed 04/25/16   Shanker Kristeen Mans, MD  isosorbide-hydrALAZINE (BIDIL)  20-37.5 MG tablet Take 1 tablet by mouth 3 (three) times daily. 04/25/16   Shanker Kristeen Mans, MD  Lancets (FREESTYLE) lancets Use as instructed 04/25/16   Jonetta Osgood, MD  metFORMIN (GLUCOPHAGE) 1000 MG tablet Take 1 tablet (1,000 mg total) by mouth 2 (two) times daily with a meal. 04/25/16   Jonetta Osgood, MD  oxyCODONE-acetaminophen (PERCOCET/ROXICET) 5-325 MG tablet Take 1 tablet by mouth every 6 (six) hours as needed for severe pain. 05/07/16   Charlann Lange, PA-C  potassium chloride SA (K-DUR,KLOR-CON) 20 MEQ tablet Take 1 tablet (20 mEq total) by mouth daily. 04/25/16   Shanker Kristeen Mans, MD  sacubitril-valsartan (ENTRESTO) 49-51 MG Take 1 tablet by mouth 2 (two) times daily. 05/05/16   Arbutus Leas, NP  spironolactone (ALDACTONE) 25 MG tablet Take 0.5 tablets (12.5 mg total) by mouth daily. 04/25/16   Shanker Kristeen Mans, MD  sulfamethoxazole-trimethoprim (BACTRIM  DS,SEPTRA DS) 800-160 MG tablet Take 1 tablet by mouth every 12 (twelve) hours. Patient not taking: Reported on 05/09/2016 04/25/16   Jonetta Osgood, MD    Family History Family History  Problem Relation Age of Onset  . Heart disease Neg Hx     Social History Social History  Substance Use Topics  . Smoking status: Former Smoker    Packs/day: 1.00    Years: 40.00    Types: Cigarettes    Start date: 06/23/1972    Quit date: 03/26/2012  . Smokeless tobacco: Never Used  . Alcohol use No     Allergies   Morphine and related   Review of Systems Review of Systems  Constitutional: Negative for chills and fever.  Gastrointestinal: Negative.  Negative for abdominal pain.  Genitourinary: Negative.  Negative for dysuria.  Musculoskeletal:       See HPI.  Skin: Negative.  Negative for color change and wound.  Neurological: Negative.      Physical Exam Updated Vital Signs BP 121/63 (BP Location: Right Arm)   Pulse 84   Temp 98.2 F (36.8 C) (Oral)   Resp 16   SpO2 100%   Physical Exam  Constitutional: She is  oriented to person, place, and time. She appears well-developed and well-nourished.  Neck: Normal range of motion.  Cardiovascular: Intact distal pulses.   Pulmonary/Chest: Effort normal.  Musculoskeletal:  Right hip tender on lateral and posterior surfaces. No swelling. She is weight bearing. No tenderness to lumbar back. Full strength right LE that is symmetric with left.  Neurological: She is alert and oriented to person, place, and time.  Skin: Skin is warm and dry.     ED Treatments / Results  Labs (all labs ordered are listed, but only abnormal results are displayed) Labs Reviewed - No data to display  EKG  EKG Interpretation None       Radiology No results found.  Procedures Procedures (including critical care time)  Medications Ordered in ED Medications  oxyCODONE-acetaminophen (PERCOCET/ROXICET) 5-325 MG per tablet 1 tablet (1 tablet Oral Given 05/07/16 0148)     Initial Impression / Assessment and Plan / ED Course  I have reviewed the triage vital signs and the nursing notes.  Pertinent labs & imaging results that were available during my care of the patient were reviewed by me and considered in my medical decision making (see chart for details).     Patient here with right hip pain that is chronic. No new injury. She takes tramadol and reports no relief today. Percocet provided in the ED with pain control. Exam unremarkable. She is felt stable for discharge and is comfortable with going home.   Final Clinical Impressions(s) / ED Diagnoses   Final diagnoses:  Sciatica of right side    New Prescriptions Discharge Medication List as of 05/07/2016  2:58 AM    START taking these medications   Details  ibuprofen (ADVIL,MOTRIN) 600 MG tablet Take 1 tablet (600 mg total) by mouth every 6 (six) hours as needed., Starting Wed 05/07/2016, Print    oxyCODONE-acetaminophen (PERCOCET/ROXICET) 5-325 MG tablet Take 1 tablet by mouth every 6 (six) hours as needed for  severe pain., Starting Wed 05/07/2016, Print         Charlann Lange, PA-C 05/18/16 [place], DO 05/18/16 [phone]

## 2016-05-20 ENCOUNTER — Inpatient Hospital Stay (HOSPITAL_COMMUNITY): Admission: RE | Admit: 2016-05-20 | Payer: Medicare Other | Source: Ambulatory Visit

## 2016-05-21 ENCOUNTER — Encounter (HOSPITAL_COMMUNITY): Payer: Self-pay

## 2016-05-21 ENCOUNTER — Other Ambulatory Visit (HOSPITAL_COMMUNITY): Payer: Self-pay

## 2016-05-21 ENCOUNTER — Ambulatory Visit (HOSPITAL_COMMUNITY)
Admission: RE | Admit: 2016-05-21 | Discharge: 2016-05-21 | Disposition: A | Discharge status: Home / Self Care | Payer: Medicare Other | Source: Ambulatory Visit | Attending: Internal Medicine | Admitting: Internal Medicine

## 2016-05-21 VITALS — BP 110/68 | HR 96 | Wt 212.8 lb

## 2016-05-21 DIAGNOSIS — I429 Cardiomyopathy, unspecified: Secondary | ICD-10-CM | POA: Insufficient documentation

## 2016-05-21 DIAGNOSIS — Z794 Long term (current) use of insulin: Secondary | ICD-10-CM | POA: Insufficient documentation

## 2016-05-21 DIAGNOSIS — Z87891 Personal history of nicotine dependence: Secondary | ICD-10-CM | POA: Insufficient documentation

## 2016-05-21 DIAGNOSIS — E785 Hyperlipidemia, unspecified: Secondary | ICD-10-CM | POA: Insufficient documentation

## 2016-05-21 DIAGNOSIS — I5022 Chronic systolic (congestive) heart failure: Secondary | ICD-10-CM | POA: Insufficient documentation

## 2016-05-21 DIAGNOSIS — I110 Hypertensive heart disease with heart failure: Secondary | ICD-10-CM | POA: Diagnosis present

## 2016-05-21 DIAGNOSIS — J849 Interstitial pulmonary disease, unspecified: Secondary | ICD-10-CM

## 2016-05-21 DIAGNOSIS — I5042 Chronic combined systolic (congestive) and diastolic (congestive) heart failure: Secondary | ICD-10-CM

## 2016-05-21 DIAGNOSIS — I10 Essential (primary) hypertension: Secondary | ICD-10-CM

## 2016-05-21 DIAGNOSIS — J449 Chronic obstructive pulmonary disease, unspecified: Secondary | ICD-10-CM | POA: Diagnosis not present

## 2016-05-21 DIAGNOSIS — Z79899 Other long term (current) drug therapy: Secondary | ICD-10-CM | POA: Insufficient documentation

## 2016-05-21 DIAGNOSIS — Z7901 Long term (current) use of anticoagulants: Secondary | ICD-10-CM | POA: Diagnosis not present

## 2016-05-21 DIAGNOSIS — E119 Type 2 diabetes mellitus without complications: Secondary | ICD-10-CM | POA: Diagnosis not present

## 2016-05-21 DIAGNOSIS — I2510 Atherosclerotic heart disease of native coronary artery without angina pectoris: Secondary | ICD-10-CM | POA: Insufficient documentation

## 2016-05-21 DIAGNOSIS — I428 Other cardiomyopathies: Secondary | ICD-10-CM

## 2016-05-21 DIAGNOSIS — I480 Paroxysmal atrial fibrillation: Secondary | ICD-10-CM | POA: Diagnosis not present

## 2016-05-21 MED ORDER — ATORVASTATIN CALCIUM 40 MG PO TABS: 40.0000 mg | ORAL_TABLET | Freq: Every day | ORAL | 3 refills | Status: DC

## 2016-05-21 MED ORDER — FUROSEMIDE 40 MG PO TABS: 40.0000 mg | ORAL_TABLET | Freq: Two times a day (BID) | ORAL | 3 refills | Status: DC

## 2016-05-21 MED ORDER — SPIRONOLACTONE 25 MG PO TABS: 25.0000 mg | ORAL_TABLET | Freq: Every day | ORAL | 3 refills | Status: DC

## 2016-05-21 MED ORDER — APIXABAN 5 MG PO TABS: 5.0000 mg | ORAL_TABLET | Freq: Two times a day (BID) | ORAL | 3 refills | Status: DC

## 2016-05-21 MED ORDER — CARVEDILOL 3.125 MG PO TABS: 3.1250 mg | ORAL_TABLET | Freq: Two times a day (BID) | ORAL | 3 refills | Status: DC

## 2016-05-21 MED ORDER — ISOSORB DINITRATE-HYDRALAZINE 20-37.5 MG PO TABS: 1.0000 | ORAL_TABLET | Freq: Three times a day (TID) | ORAL | 3 refills | Status: DC

## 2016-05-21 MED ORDER — AMIODARONE HCL 200 MG PO TABS: 200.0000 mg | ORAL_TABLET | Freq: Every day | ORAL | 3 refills | Status: DC

## 2016-05-21 NOTE — Progress Notes (Signed)
Patient ID: [name], female   DOB: [birth year], 66 y.o.   MRN: [id number]  PCP: Dr. Alyson Ingles Cardiology: Dr Aundra Dubin  63 yo with history of nonischemic cardiomyopathy presents for cardiology followup.  She was admitted with CHF exacerbation in 02/2012.  EF 20-25% on echo, LHC with nonobstructive CAD.  She was started on cardiac meds and discharged.  In 07/2012, she was admitted again with CHF exacerbation.  She had run out of Lasix.  She was taking her other heart medications as ordered, however.  She was diuresed and discharged. She had a chronic LBBB, and Medtronic CRT-D device was placed in 10/14.  She was admitted in 6/16 with hypertensive emergency and CHF exacerbation.   Also of note, she had PFTs in 9/16 showing restrictive spirometry with low lung volume and DLCO.  She was supposed to get a high resolution CT to evaluate for interstitial lung disease but never had the study.     Admitted March 2018 with urosepsis--> E Coli Bacteremia. Completed antibiotic course. EF was down from previous. Also had  A fib so she was loaded on amio. Placed on eliquis. Discharge weight was 208 pounds.   Today she presents for HF follow up. Overall feeling ok. SOB with exertion. Uses electric cart in grocery store. Denies PND/Orthopnea. Weight at home 210-211 pounds. Following low salt diet and tries to limit fluid intake. Out of carvedilol for 2 days. Lives alone. Followed by Paramedicine.   Optivol: fluid below index, activity ~4 hours per day  Labs (2/14): SPEP negative, UPEP negative, HIV negative Labs (7/14): K 4, creatinine 1.0, LDL 66 Labs (08/25/12): K 4, creatinine 1.0 Labs (10/06/12) : K 3.3 creatinine 0.7 pro-bnp 188 Labs (11/15/12): K 4.4, creatinine 1.04, pro-BNP 468 Labs (3/15): K 4.3 Creatinine 0.83 Labs (5/15): K 4.1 Creatinine 0.85 Labs (9/16): K 4.3, creatinine 1.01 => 0.84, HCT 38.1, BNP 234 Labs (6/17): K 3.6, creatinine 0.78 Labs (4/9/20187): K 4.9 Creatinine 1.25.   PMH: 1. HTN 2.  Type II diabetes 3. Nonischemic cardiomyopathy: ? Due to HTN versus LBBB CMP.  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  Echo (2/14) with EF 20-25%.  Echo (7/14) with EF 25%, diffuse hypokinesis.  HIV, SPEP, UPEP negative.  Has LBBB. CRT-D 10/2012 (Medtronic).  Echo (4/15) with EF 45-50%, mild diffuse hypokinesis, PA systolic pressure 38 mmHg.  Echo (6/16) with EF 40-45%, mild LVH, septal and inferior hypokinesis.   4. Chronic LBBB 5. Right TKR 6. Bilateral THR.  7. Hyperlipidemia 8. ?COPD: Has oxygen for use with exertion.  9. Atrial fibrillation: Paroxysmal.  10. PFTs (9/16) with FEV1 77%, FVC 76%, ratio 101%, TLC 63%, DLCO 41% => moderate restrictive deficit.  11. ECHO 03/3016 EF 30-35%. Grade 1 DD  SH: Prior smoker, quit 2/14.  Never drank ETOH.  No drugs. Lives with son.   FH: Mother with "heart trouble."   ROS: All systems reviewed and negative except as per HPI.   Current Outpatient Prescriptions  Medication Sig Dispense Refill  . amiodarone (PACERONE) 200 MG tablet Amiodarone 400 mg (2 tabs) twice daily x 5 more days, then, Amiodarone 200 mg twice daily for 1 week,then. Amiodarone 200 mg daily. 120 tablet 0  . apixaban (ELIQUIS) 5 MG TABS tablet Take 1 tablet (5 mg total) by mouth 2 (two) times daily. 60 tablet 0  . atorvastatin (LIPITOR) 40 MG tablet Take 1 tablet (40 mg total) by mouth daily. 30 tablet 0  . carvedilol (COREG) 3.125 MG tablet Take 1 tablet (3.125  mg total) by mouth 2 (two) times daily with a meal. 30 tablet 0  . furosemide (LASIX) 40 MG tablet Take 1 tablet (40 mg total) by mouth 2 (two) times daily. 60 tablet 0  . glucose blood (FREESTYLE TEST STRIPS) test strip Use as instructed 100 each 0  . glucose monitoring kit (FREESTYLE) monitoring kit 1 each by Does not apply route 4 (four) times daily - after meals and at bedtime. 1 month Diabetic Testing Supplies for QAC-QHS accuchecks. 1 each 1  . ibuprofen (ADVIL,MOTRIN) 600 MG tablet Take 1 tablet (600 mg total) by mouth  every 6 (six) hours as needed. 30 tablet 0  . insulin aspart (NOVOLOG FLEXPEN) 100 UNIT/ML FlexPen 0-20 Units, Subcutaneous, 3 times daily with meals CBG < 70: implement hypoglycemia protocol CBG 70 - 120: 0 units CBG 121 - 150: 3 units CBG 151 - 200: 4 units CBG 201 - 250: 7 units CBG 251 - 300: 11 units CBG 301 - 350: 15 units CBG 351 - 400: 20 units CBG > 400: call MD 15 mL 0  . Insulin Glargine (LANTUS SOLOSTAR) 100 UNIT/ML Solostar Pen Inject 55 Units into the skin daily. 15 mL 0  . Insulin Pen Needle 32G X 8 MM MISC Use as directed 100 each 0  . isosorbide-hydrALAZINE (BIDIL) 20-37.5 MG tablet Take 1 tablet by mouth 3 (three) times daily. 90 tablet 0  . Lancets (FREESTYLE) lancets Use as instructed 100 each 0  . metFORMIN (GLUCOPHAGE) 1000 MG tablet Take 1 tablet (1,000 mg total) by mouth 2 (two) times daily with a meal. 30 tablet 0  . oxyCODONE-acetaminophen (PERCOCET/ROXICET) 5-325 MG tablet Take 1 tablet by mouth every 6 (six) hours as needed for severe pain. 8 tablet 0  . potassium chloride SA (K-DUR,KLOR-CON) 20 MEQ tablet Take 1 tablet (20 mEq total) by mouth daily. 60 tablet 0  . sacubitril-valsartan (ENTRESTO) 49-51 MG Take 1 tablet by mouth 2 (two) times daily. 60 tablet 6  . spironolactone (ALDACTONE) 25 MG tablet Take 0.5 tablets (12.5 mg total) by mouth daily. 30 tablet 0  . acetaminophen (TYLENOL) 500 MG tablet Take 1 tablet (500 mg total) by mouth every 6 (six) hours as needed. (Patient taking differently: Take 500 mg by mouth every 6 (six) hours as needed for moderate pain. ) 30 tablet 0  . albuterol (PROVENTIL HFA;VENTOLIN HFA) 108 (90 Base) MCG/ACT inhaler Inhale 2 puffs into the lungs every 6 (six) hours as needed for wheezing or shortness of breath. 18 g 0   No current facility-administered medications for this encounter.     Vitals:   05/21/16 0907  BP: 110/68  Pulse: 96  SpO2: 100%  Weight: 212 lb 12.8 oz (96.5 kg)   General:  Well appearing. No resp  difficulty HEENT: normal Neck: supple. JVP 7-8. Carotids 2+ bilat; no bruits. No lymphadenopathy or thryomegaly appreciated. Cor: PMI nondisplaced. Regular rate & rhythm. No rubs, gallops or murmurs. Lungs: clear Abdomen: obese, soft, nontender, nondistended. No hepatosplenomegaly. No bruits or masses. Good bowel sounds. Extremities: no cyanosis, clubbing, rash, edema Neuro: alert & orientedx3, cranial nerves grossly intact. moves all 4 extremities w/o difficulty. Affect pleasant  Assessment/Plan: 1. Chronic systolic CHF: Nonischemic cardiomyopathy. S/P CRT-D (Medtronic).  ECHO  placed 10/14. EF most recently 40-45% (6/16).   NYHA class III symptoms.  Volume status mildly elevated. Increase spiro to 25 mg daily. Stop potassium  Continue current dose of lasix.  Today she will restart coreg 3.125 mg twice a day.  Continue current dose of bidil and entresto.  All medications discussed with her and [place], Producer, television/film/video.  -2. Hyperlipidemia: Continue atorvastatin. On atorvastatin.   3. HTN: Controlled continue .  4. Restrictive PFTs: Pattern is suggestive of a parenchymal problem, ?interstitial fibrosis.  I will get a high resolution CT of the chest (had ordered in the past but never done).   5. PAF- Regular pulse. No A fib on device interrogation. Continue amio 200 mg daily. With possible interstitial fibrosis will need to stop amio at next vist. Plan to check TSH, LFTs at next visit. She understands she needs to have yearly eye exams. Continue eliquis 5 mg twice a day. No bleeding problems.   Follow up next week for BMET. Follow up in 2 weeks for visit. Plan to repeat ECHO in 3 months after HF meds optimized.   Whitlee Sluder NP-C  05/21/2016

## 2016-05-21 NOTE — Patient Instructions (Signed)
STOP Potassium  Increase Spironolactone to 25 mg (1 Tablet) Once Daily  Labs next week (BMET)  Follow up in 2 Weeks

## 2016-05-21 NOTE — Progress Notes (Signed)
Paramedicine Encounter   Patient ID: [name] , female,   DOB: 1950-06-11,65 y.o.,  MRN: [id number]   Met patient in clinic today with provider Amy. Pt stated that she was depressed last night and crying but is feeling a lot better this am.  pt stated that she felt SOB and had to use her inhaler which gave her some relief this morning. Pt stated that she took her medications prior to this visit. Pt has no other complaints today.    *stopped potassium, increased spirolactone, continue carvediol Time spent with patient [place], EMT-Paramedic 05/21/2016   ACTION: Home visit completed

## 2016-05-22 ENCOUNTER — Telehealth (HOSPITAL_COMMUNITY): Payer: Self-pay

## 2016-05-22 ENCOUNTER — Other Ambulatory Visit (HOSPITAL_COMMUNITY): Payer: Self-pay

## 2016-05-22 MED ORDER — SACUBITRIL-VALSARTAN 49-51 MG PO TABS: 1.0000 | ORAL_TABLET | Freq: Two times a day (BID) | ORAL | 6 refills | Status: DC

## 2016-05-22 MED ORDER — ISOSORB DINITRATE-HYDRALAZINE 20-37.5 MG PO TABS: 1.0000 | ORAL_TABLET | Freq: Three times a day (TID) | ORAL | 3 refills | Status: DC

## 2016-05-22 MED ORDER — FUROSEMIDE 40 MG PO TABS: 40.0000 mg | ORAL_TABLET | Freq: Two times a day (BID) | ORAL | 3 refills | Status: DC

## 2016-05-22 MED ORDER — CARVEDILOL 3.125 MG PO TABS: 3.1250 mg | ORAL_TABLET | Freq: Two times a day (BID) | ORAL | 3 refills | Status: DC

## 2016-05-22 MED ORDER — APIXABAN 5 MG PO TABS: 5.0000 mg | ORAL_TABLET | Freq: Two times a day (BID) | ORAL | 3 refills | Status: DC

## 2016-05-22 MED ORDER — SPIRONOLACTONE 25 MG PO TABS: 25.0000 mg | ORAL_TABLET | Freq: Every day | ORAL | 3 refills | Status: DC

## 2016-05-22 MED ORDER — AMIODARONE HCL 200 MG PO TABS: 200.0000 mg | ORAL_TABLET | Freq: Every day | ORAL | 3 refills | Status: DC

## 2016-05-22 MED ORDER — ATORVASTATIN CALCIUM 40 MG PO TABS: 40.0000 mg | ORAL_TABLET | Freq: Every day | ORAL | 3 refills | Status: DC

## 2016-05-22 NOTE — Progress Notes (Signed)
Paramedicine Encounter    Patient ID: [name], female    DOB: Dec 26, 1950, 66 y.o.   MRN: [id number]    Patient Care Team: [name], MD as PCP - General (Family Medicine)  Patient Active Problem List   Diagnosis Date Noted  . Paroxysmal atrial fibrillation (HCC)   . Biventricular automatic implantable cardioverter defibrillator in situ   . Sepsis ([place]) 04/20/2016  . UTI (urinary tract infection) 04/20/2016  . Dyspnea 07/19/2015  . Interstitial lung disease ([place]) 11/20/2014  . SIRS (systemic inflammatory response syndrome) ([place]) 02/03/2014  . DM (diabetes mellitus) ([place]) 02/03/2014  . Tachycardia 06/14/2013  . Chronic combined systolic and diastolic CHF (congestive heart failure) ([place]) 09/29/2012  . Hypoxia 03/06/2012  . Hypertensive heart disease 03/06/2012  . Nonischemic cardiomyopathy ([place]) 03/06/2012  . Tobacco abuse 03/06/2012  . Type 2 diabetes mellitus ([place]) 03/06/2012  . Hypertension 03/06/2012  . CAD (coronary artery disease), native coronary artery 03/06/2012  . Acute on chronic combined systolic and diastolic heart failure ([place]) 03/06/2012  . Hypokalemia 03/06/2012  . Acute bronchitis 02/01/2009  . Sleep apnea 02/01/2009  . Chest pain 02/01/2009    Current Outpatient Prescriptions:  .  acetaminophen (TYLENOL) 500 MG tablet, Take 1 tablet (500 mg total) by mouth every 6 (six) hours as needed. (Patient taking differently: Take 500 mg by mouth every 6 (six) hours as needed for moderate pain. ), Disp: 30 tablet, Rfl: 0 .  albuterol (PROVENTIL HFA;VENTOLIN HFA) 108 (90 Base) MCG/ACT inhaler, Inhale 2 puffs into the lungs every 6 (six) hours as needed for wheezing or shortness of breath., Disp: 18 g, Rfl: 0 .  amiodarone (PACERONE) 200 MG tablet, Take 1 tablet (200 mg total) by mouth daily., Disp: 30 tablet, Rfl: 3 .  apixaban (ELIQUIS) 5 MG TABS tablet, Take 1 tablet (5 mg total) by mouth 2 (two) times daily., Disp: 60 tablet, Rfl: 3 .  atorvastatin (LIPITOR)  40 MG tablet, Take 1 tablet (40 mg total) by mouth daily., Disp: 30 tablet, Rfl: 3 .  carvedilol (COREG) 3.125 MG tablet, Take 1 tablet (3.125 mg total) by mouth 2 (two) times daily with a meal., Disp: 60 tablet, Rfl: 3 .  furosemide (LASIX) 40 MG tablet, Take 1 tablet (40 mg total) by mouth 2 (two) times daily., Disp: 60 tablet, Rfl: 3 .  glucose blood (FREESTYLE TEST STRIPS) test strip, Use as instructed, Disp: 100 each, Rfl: 0 .  glucose monitoring kit (FREESTYLE) monitoring kit, 1 each by Does not apply route 4 (four) times daily - after meals and at bedtime. 1 month Diabetic Testing Supplies for QAC-QHS accuchecks., Disp: 1 each, Rfl: 1 .  ibuprofen (ADVIL,MOTRIN) 600 MG tablet, Take 1 tablet (600 mg total) by mouth every 6 (six) hours as needed., Disp: 30 tablet, Rfl: 0 .  insulin aspart (NOVOLOG FLEXPEN) 100 UNIT/ML FlexPen, 0-20 Units, Subcutaneous, 3 times daily with meals CBG < 70: implement hypoglycemia protocol CBG 70 - 120: 0 units CBG 121 - 150: 3 units CBG 151 - 200: 4 units CBG 201 - 250: 7 units CBG 251 - 300: 11 units CBG 301 - 350: 15 units CBG 351 - 400: 20 units CBG > 400: call MD, Disp: 15 mL, Rfl: 0 .  Insulin Glargine (LANTUS SOLOSTAR) 100 UNIT/ML Solostar Pen, Inject 55 Units into the skin daily., Disp: 15 mL, Rfl: 0 .  Insulin Pen Needle 32G X 8 MM MISC, Use as directed, Disp: 100 each, Rfl: 0 .  isosorbide-hydrALAZINE (BIDIL) 20-37.5  MG tablet, Take 1 tablet by mouth 3 (three) times daily., Disp: 90 tablet, Rfl: 3 .  Lancets (FREESTYLE) lancets, Use as instructed, Disp: 100 each, Rfl: 0 .  metFORMIN (GLUCOPHAGE) 1000 MG tablet, Take 1 tablet (1,000 mg total) by mouth 2 (two) times daily with a meal., Disp: 30 tablet, Rfl: 0 .  oxyCODONE-acetaminophen (PERCOCET/ROXICET) 5-325 MG tablet, Take 1 tablet by mouth every 6 (six) hours as needed for severe pain., Disp: 8 tablet, Rfl: 0 .  sacubitril-valsartan (ENTRESTO) 49-51 MG, Take 1 tablet by mouth 2 (two) times daily., Disp: 60  tablet, Rfl: 6 .  spironolactone (ALDACTONE) 25 MG tablet, Take 1 tablet (25 mg total) by mouth daily., Disp: 30 tablet, Rfl: 3 Allergies  Allergen Reactions  . Morphine And Related Nausea And Vomiting and Other (See Comments)    Severe nausea     Social History   Social History  . Marital status: Single    Spouse name: N/A  . Number of children: N/A  . Years of education: N/A   Occupational History  . Not on file.   Social History Main Topics  . Smoking status: Former Smoker    Packs/day: 1.00    Years: 40.00    Types: Cigarettes    Start date: 06/23/1972    Quit date: 03/26/2012  . Smokeless tobacco: Never Used  . Alcohol use No  . Drug use: No  . Sexual activity: Yes   Other Topics Concern  . Not on file   Social History Narrative  . No narrative on file    Physical Exam  Pulmonary/Chest: No respiratory distress. She has no rales.  Abdominal: She exhibits no distension. There is no tenderness. There is no guarding.  Musculoskeletal: She exhibits no edema.  Skin: Skin is warm and dry. She is not diaphoretic.        SAFE - 05/02/16 1100      Situation   Heart failure history New   Comorbidities Atrial fibillation;COPD;DM;HTN   Readmitted within 30 days Yes   Hospital admission within past 12 months Yes   number of hospital admissions 2   number of ED visits 2     Assessment   Lives alone No   Primary support person sister   Mode of transportation personal car   Other services involved None   Home equipement Cane;Walker;Other     Weight   Weighs self daily No   Scale provided No   Records on weight chart No     Resources   Has "Living better w/heart failure" book Yes   Has HF Zone tool Yes   Able to identify yellow zone signs/when to call MD Yes   Records zone daily No     Medications   Uses a pill box Yes   Who stocks the pill box [facility]   Pill box checked this visit Yes   Pill box refilled this visit Yes   Difficulty obtaining medications  No   Mail order medications No   Missed one or more doses of medications per week No     Nutrition   Patient receives meals on wheels No   Patient follows low sodium diet Yes   Has foods at home that meet the current recommended diet Yes   Patient follows low sugar/card diet Yes     Activity Level   ADL's/Mobility Independent   How many feet can patient ambulate 75   Barriers knee pain at times   Actvity tolerance:  NYHA Class 3     Urine   Difficulty urinating No   Changes in urine None     Time spent with patient   Time spent with patient  69 Minutes        Future Appointments Date Time Provider [place]  05/28/2016 9:00 AM [facility] LAB [facility] None  06/04/2016 9:30 AM [facility] PA/NP [facility] None    ATF pt CAO x4 with no complaints. Pt was eating a bologna sandwich upon my arrival.  I advised pt against eating processed meats due to the high sodium.  [facility] re-visit due to pt not having all of her medications or pill box yesterday during her clinic visit.  Pt had medications that needed to physician approval for refills.  rx bottles verified and pill box filled.  Pt requested that her prescriptions be transferred to [address] [facility].  Pt took novolog after I checked her cbg prior to me leaving.  Pt stated that she is "going out of town with family", she took her medications with her.  Called in metformin cbg 426 BP (!) 158/78 (BP Location: Right Arm, Patient Position: Sitting)   Pulse (!) 103   Wt 218 lb 8 oz (99.1 kg)   SpO2 97%   BMI 34.22 kg/m   Weight yesterday-212 Last visit weight-211    Nevena Rozenberg, EMT Paramedic 05/22/2016    ACTION: Home visit completed

## 2016-05-22 NOTE — Telephone Encounter (Signed)
Patient calling to obtain refills, states her pharmacy did not receive them after her appointment yesterday. Refills were sent to wrong pharmacy per our records, correct pharmacy selected for patient's primary pharmacy and all heart medications sent electronically per patient request. Patient aware and appreciative.  Renee Pain, RN

## 2016-05-28 ENCOUNTER — Ambulatory Visit (HOSPITAL_COMMUNITY)
Admission: RE | Admit: 2016-05-28 | Discharge: 2016-05-28 | Disposition: A | Discharge status: Home / Self Care | Payer: Medicare Other | Source: Ambulatory Visit | Attending: Cardiology | Admitting: Cardiology

## 2016-05-28 DIAGNOSIS — I5042 Chronic combined systolic (congestive) and diastolic (congestive) heart failure: Secondary | ICD-10-CM

## 2016-05-28 LAB — BASIC METABOLIC PANEL
Anion gap: 8 (ref 5–15)
BUN: 17 mg/dL (ref 6–20)
CO2: 32 mmol/L (ref 22–32)
Calcium: 9.1 mg/dL (ref 8.9–10.3)
Chloride: 97 mmol/L — ABNORMAL LOW (ref 101–111)
Creatinine, Ser: 1.03 mg/dL — ABNORMAL HIGH (ref 0.44–1.00)
GFR calc Af Amer: >60 mL/min (ref >60)
GFR calc non Af Amer: 57 mL/min — ABNORMAL LOW (ref >60)
Glucose, Bld: 178 mg/dL — ABNORMAL HIGH (ref 65–99)
Potassium: 3.7 mmol/L (ref 3.5–5.1)
Sodium: 137 mmol/L (ref 135–145)

## 2016-05-29 ENCOUNTER — Telehealth (HOSPITAL_COMMUNITY): Payer: Self-pay | Admitting: *Deleted

## 2016-05-29 ENCOUNTER — Other Ambulatory Visit (HOSPITAL_COMMUNITY): Payer: Self-pay | Admitting: Student

## 2016-05-29 ENCOUNTER — Other Ambulatory Visit (HOSPITAL_COMMUNITY): Payer: Self-pay

## 2016-05-29 MED ORDER — METFORMIN HCL 1000 MG PO TABS: 1000.0000 mg | ORAL_TABLET | Freq: Two times a day (BID) | ORAL | 0 refills | Status: DC

## 2016-05-29 NOTE — Addendum Note (Signed)
Addended by: Scarlette Calico on: 05/29/2016 04:15 PM   Modules accepted: Orders

## 2016-05-29 NOTE — Progress Notes (Signed)
Paramedicine Encounter    Patient ID: [name], female    DOB: 1950/11/20, 66 y.o.   MRN: [id number]    Patient Care Team: [name], MD as PCP - General (Family Medicine)  Patient Active Problem List   Diagnosis Date Noted  . Paroxysmal atrial fibrillation (HCC)   . Biventricular automatic implantable cardioverter defibrillator in situ   . Sepsis ([place]) 04/20/2016  . UTI (urinary tract infection) 04/20/2016  . Dyspnea 07/19/2015  . Interstitial lung disease ([place]) 11/20/2014  . SIRS (systemic inflammatory response syndrome) ([place]) 02/03/2014  . DM (diabetes mellitus) ([place]) 02/03/2014  . Tachycardia 06/14/2013  . Chronic combined systolic and diastolic CHF (congestive heart failure) ([place]) 09/29/2012  . Hypoxia 03/06/2012  . Hypertensive heart disease 03/06/2012  . Nonischemic cardiomyopathy ([place]) 03/06/2012  . Tobacco abuse 03/06/2012  . Type 2 diabetes mellitus ([place]) 03/06/2012  . Hypertension 03/06/2012  . CAD (coronary artery disease), native coronary artery 03/06/2012  . Acute on chronic combined systolic and diastolic heart failure ([place]) 03/06/2012  . Hypokalemia 03/06/2012  . Acute bronchitis 02/01/2009  . Sleep apnea 02/01/2009  . Chest pain 02/01/2009    Current Outpatient Prescriptions:  .  albuterol (PROVENTIL HFA;VENTOLIN HFA) 108 (90 Base) MCG/ACT inhaler, Inhale 2 puffs into the lungs every 6 (six) hours as needed for wheezing or shortness of breath., Disp: 18 g, Rfl: 0 .  amiodarone (PACERONE) 200 MG tablet, Take 1 tablet (200 mg total) by mouth daily., Disp: 30 tablet, Rfl: 3 .  apixaban (ELIQUIS) 5 MG TABS tablet, Take 1 tablet (5 mg total) by mouth 2 (two) times daily., Disp: 60 tablet, Rfl: 3 .  atorvastatin (LIPITOR) 40 MG tablet, Take 1 tablet (40 mg total) by mouth daily., Disp: 30 tablet, Rfl: 3 .  carvedilol (COREG) 3.125 MG tablet, Take 1 tablet (3.125 mg total) by mouth 2 (two) times daily with a meal., Disp: 60 tablet, Rfl: 3 .  furosemide  (LASIX) 40 MG tablet, Take 1 tablet (40 mg total) by mouth 2 (two) times daily., Disp: 60 tablet, Rfl: 3 .  glucose blood (FREESTYLE TEST STRIPS) test strip, Use as instructed, Disp: 100 each, Rfl: 0 .  glucose monitoring kit (FREESTYLE) monitoring kit, 1 each by Does not apply route 4 (four) times daily - after meals and at bedtime. 1 month Diabetic Testing Supplies for QAC-QHS accuchecks., Disp: 1 each, Rfl: 1 .  insulin aspart (NOVOLOG FLEXPEN) 100 UNIT/ML FlexPen, 0-20 Units, Subcutaneous, 3 times daily with meals CBG < 70: implement hypoglycemia protocol CBG 70 - 120: 0 units CBG 121 - 150: 3 units CBG 151 - 200: 4 units CBG 201 - 250: 7 units CBG 251 - 300: 11 units CBG 301 - 350: 15 units CBG 351 - 400: 20 units CBG > 400: call MD, Disp: 15 mL, Rfl: 0 .  Insulin Glargine (LANTUS SOLOSTAR) 100 UNIT/ML Solostar Pen, Inject 55 Units into the skin daily., Disp: 15 mL, Rfl: 0 .  isosorbide-hydrALAZINE (BIDIL) 20-37.5 MG tablet, Take 1 tablet by mouth 3 (three) times daily., Disp: 90 tablet, Rfl: 3 .  Lancets (FREESTYLE) lancets, Use as instructed, Disp: 100 each, Rfl: 0 .  metFORMIN (GLUCOPHAGE) 1000 MG tablet, Take 1 tablet (1,000 mg total) by mouth 2 (two) times daily with a meal., Disp: 60 tablet, Rfl: 0 .  sacubitril-valsartan (ENTRESTO) 49-51 MG, Take 1 tablet by mouth 2 (two) times daily., Disp: 60 tablet, Rfl: 6 .  spironolactone (ALDACTONE) 25 MG tablet, Take 1 tablet (25 mg  total) by mouth daily., Disp: 30 tablet, Rfl: 3 .  acetaminophen (TYLENOL) 500 MG tablet, Take 1 tablet (500 mg total) by mouth every 6 (six) hours as needed. (Patient taking differently: Take 500 mg by mouth every 6 (six) hours as needed for moderate pain. ), Disp: 30 tablet, Rfl: 0 .  ibuprofen (ADVIL,MOTRIN) 600 MG tablet, Take 1 tablet (600 mg total) by mouth every 6 (six) hours as needed., Disp: 30 tablet, Rfl: 0 .  Insulin Pen Needle 32G X 8 MM MISC, Use as directed, Disp: 100 each, Rfl: 0 .  oxyCODONE-acetaminophen  (PERCOCET/ROXICET) 5-325 MG tablet, Take 1 tablet by mouth every 6 (six) hours as needed for severe pain., Disp: 8 tablet, Rfl: 0 Allergies  Allergen Reactions  . Morphine And Related Nausea And Vomiting and Other (See Comments)    Severe nausea     Social History   Social History  . Marital status: Single    Spouse name: N/A  . Number of children: N/A  . Years of education: N/A   Occupational History  . Not on file.   Social History Main Topics  . Smoking status: Former Smoker    Packs/day: 1.00    Years: 40.00    Types: Cigarettes    Start date: 06/23/1972    Quit date: 03/26/2012  . Smokeless tobacco: Never Used  . Alcohol use No  . Drug use: No  . Sexual activity: Yes   Other Topics Concern  . Not on file   Social History Narrative  . No narrative on file    Physical Exam  Pulmonary/Chest: No respiratory distress. She has no wheezes. She has no rales.  Abdominal: She exhibits no distension. There is no tenderness. There is no guarding.  Musculoskeletal: She exhibits no edema.  Skin: Skin is warm and dry. She is not diaphoretic.        SAFE - 05/02/16 1100      Situation   Heart failure history New   Comorbidities Atrial fibillation;COPD;DM;HTN   Readmitted within 30 days Yes   Hospital admission within past 12 months Yes   number of hospital admissions 2   number of ED visits 2     Assessment   Lives alone No   Primary support person sister   Mode of transportation personal car   Other services involved None   Home equipement Cane;Walker;Other     Weight   Weighs self daily No   Scale provided No   Records on weight chart No     Resources   Has "Living better w/heart failure" book Yes   Has HF Zone tool Yes   Able to identify yellow zone signs/when to call MD Yes   Records zone daily No     Medications   Uses a pill box Yes   Who stocks the pill box [facility]   Pill box checked this visit Yes   Pill box refilled this visit Yes   Difficulty  obtaining medications No   Mail order medications No   Missed one or more doses of medications per week No     Nutrition   Patient receives meals on wheels No   Patient follows low sodium diet Yes   Has foods at home that meet the current recommended diet Yes   Patient follows low sugar/card diet Yes     Activity Level   ADL's/Mobility Independent   How many feet can patient ambulate 75   Barriers knee pain at times  Actvity tolerance: NYHA Class 3     Urine   Difficulty urinating No   Changes in urine None     Time spent with patient   Time spent with patient  66 Minutes        Future Appointments Date Time Provider [place]  06/04/2016 9:30 AM [facility] PA/NP [facility] None    ATF pt CAO x4 c/o lower back pain that started earlier today. Pt stated that she took pain rx but she refuses to take "that oxycodone because it scares her".  Pt has no other complaints today. Pt denies sob, headache, dizziness, and chest pain.  Pt has taken all of her medications without any issues per pt.  I advised pt that the heart clinic called in the refill for metformin 1000 mg due to her not being able to contact Dr. Alyson Ingles for several weeks.  I also advised her that Kennyth Lose may give her a call next week about possibly changing her primary physician.  Pt was thankful.  Pt stated that she has tried to stay away from foods high in sodium but was eating a sandwich with sausages on it upon my arrival.  I advised pt against eating those processed meats.  Pt has been weighing herself daily and recording her weights.  I verified her weights and noticed that the scale numbers were bouncing around because the corner of the scale was on carpet.    rx bottles verified and pill box refilled.    BP 116/70 (BP Location: Right Arm, Patient Position: Sitting, Cuff Size: Normal)   Pulse 90   Resp 16   Wt 216 lb 6.4 oz (98.2 kg)   SpO2 97%   BMI 33.89 kg/m   cbg 249  **rx called in:   entresto  Weight yesterday-weight inaccurate/scale was on the carpet  Last visit weight-218    Demetria Copper, EMT Paramedic 05/29/2016    ACTION: Home visit completed Next visit planned for next friday

## 2016-05-29 NOTE — Telephone Encounter (Signed)
[name] called to let us know pt has been out of Metformin for over a week now.  Pt has called PCP multiple times and has even went to his office to find the door locked.  Per Colleen Kilts, PA ok to give 1 month supply and get pt established w/new pcp.  [name] is aware and will advise pt.  Message sent to [place], CSW to get new pcp for pt.

## 2016-06-04 ENCOUNTER — Ambulatory Visit (HOSPITAL_COMMUNITY)
Admission: RE | Admit: 2016-06-04 | Discharge: 2016-06-04 | Disposition: A | Discharge status: Home / Self Care | Payer: Medicare Other | Source: Ambulatory Visit | Attending: Cardiology | Admitting: Cardiology

## 2016-06-04 ENCOUNTER — Other Ambulatory Visit (HOSPITAL_COMMUNITY): Payer: Self-pay

## 2016-06-04 ENCOUNTER — Encounter (HOSPITAL_COMMUNITY): Payer: Self-pay

## 2016-06-04 ENCOUNTER — Telehealth: Payer: Self-pay | Admitting: Licensed Clinical Social Worker

## 2016-06-04 VITALS — BP 128/66 | HR 86 | Wt 218.0 lb

## 2016-06-04 DIAGNOSIS — E119 Type 2 diabetes mellitus without complications: Secondary | ICD-10-CM | POA: Diagnosis not present

## 2016-06-04 DIAGNOSIS — J449 Chronic obstructive pulmonary disease, unspecified: Secondary | ICD-10-CM | POA: Insufficient documentation

## 2016-06-04 DIAGNOSIS — Z8249 Family history of ischemic heart disease and other diseases of the circulatory system: Secondary | ICD-10-CM | POA: Insufficient documentation

## 2016-06-04 DIAGNOSIS — E785 Hyperlipidemia, unspecified: Secondary | ICD-10-CM | POA: Insufficient documentation

## 2016-06-04 DIAGNOSIS — Z79899 Other long term (current) drug therapy: Secondary | ICD-10-CM | POA: Insufficient documentation

## 2016-06-04 DIAGNOSIS — I480 Paroxysmal atrial fibrillation: Secondary | ICD-10-CM

## 2016-06-04 DIAGNOSIS — I428 Other cardiomyopathies: Secondary | ICD-10-CM

## 2016-06-04 DIAGNOSIS — I110 Hypertensive heart disease with heart failure: Secondary | ICD-10-CM | POA: Insufficient documentation

## 2016-06-04 DIAGNOSIS — Z87891 Personal history of nicotine dependence: Secondary | ICD-10-CM | POA: Insufficient documentation

## 2016-06-04 DIAGNOSIS — I5022 Chronic systolic (congestive) heart failure: Secondary | ICD-10-CM | POA: Insufficient documentation

## 2016-06-04 DIAGNOSIS — I5042 Chronic combined systolic (congestive) and diastolic (congestive) heart failure: Secondary | ICD-10-CM | POA: Diagnosis not present

## 2016-06-04 DIAGNOSIS — Z7901 Long term (current) use of anticoagulants: Secondary | ICD-10-CM | POA: Insufficient documentation

## 2016-06-04 DIAGNOSIS — I2510 Atherosclerotic heart disease of native coronary artery without angina pectoris: Secondary | ICD-10-CM | POA: Insufficient documentation

## 2016-06-04 DIAGNOSIS — I429 Cardiomyopathy, unspecified: Secondary | ICD-10-CM | POA: Insufficient documentation

## 2016-06-04 DIAGNOSIS — Z794 Long term (current) use of insulin: Secondary | ICD-10-CM | POA: Diagnosis not present

## 2016-06-04 MED ORDER — SACUBITRIL-VALSARTAN 97-103 MG PO TABS: 1.0000 | ORAL_TABLET | Freq: Two times a day (BID) | ORAL | 6 refills | Status: DC

## 2016-06-04 NOTE — Patient Instructions (Addendum)
EKG: normal EKG, normal sinus rhythm, unchanged from previous tracings.  STOP Amiodarone.  INCREASE Entresto to 97/103 mg twice daily. May "double up" on current 49/51 mg tablets (Take 2 tabs twice daily). New Rx has been sent to your pharmacy for 97/103 mg tablets (Take 1 tab twice daily).  Follow up 2 weeks with Amy Clegg NP-C.  Do the following things EVERYDAY: 1) Weigh yourself in the morning before breakfast. Write it down and keep it in a log. 2) Take your medicines as prescribed 3) Eat low salt foods-Limit salt (sodium) to 2000 mg per day.  4) Stay as active as you can everyday 5) Limit all fluids for the day to less than 2 liters

## 2016-06-04 NOTE — Progress Notes (Signed)
Paramedicine Encounter   Patient ID: [name] , female,   DOB: [birth year],66 y.o.,  MRN: [id number]   Met patient in clinic today with provider Amy.  Pt has no complaints today.   Pt has taken her medications but did not bring her pill box.  Pt has been taking her medications as prescribed without difficulty.  Pt denies pain, sob, dizziness, and headaches.  Pt will get a EKG done to see if she still has irregular HR, if not she will be taken off of amiodarone. Time spent with patient 30 mins.   ** increased entresto 97-103  Neenah Canter, EMT-Paramedic 06/04/2016   ACTION: Next visit planned for next week

## 2016-06-04 NOTE — Progress Notes (Signed)
Patient ID: [name], female   DOB: [birth year], 66 y.o.   MRN: [id number]  PCP: Dr. Alyson Ingles Cardiology: Dr Aundra Dubin  66 yo with history of nonischemic cardiomyopathy presents for cardiology followup.  She was admitted with CHF exacerbation in 02/2012.  EF 20-25% on echo, LHC with nonobstructive CAD.  She was started on cardiac meds and discharged.  In 07/2012, she was admitted again with CHF exacerbation.  She had run out of Lasix.  She was taking her other heart medications as ordered, however.  She was diuresed and discharged. She had a chronic LBBB, and Medtronic CRT-D device was placed in 10/14.  She was admitted in 6/16 with hypertensive emergency and CHF exacerbation.   Also of note, she had PFTs in 9/16 showing restrictive spirometry with low lung volume and DLCO.  She was supposed to get a high resolution CT to evaluate for interstitial lung disease but never had the study.     Admitted March 2018 with urosepsis--> E Coli Bacteremia. Completed antibiotic course. EF was down from previous. Also had  A fib so she was loaded on amio. Placed on eliquis. Discharge weight was 208 pounds.   Today she returns for HF follow up. Last visit carvedilol was restarted. Weight at home 216-218 pounds. Denies SOB/PND/Orthopnea. Eating high salt diet with sausage and bologna. Drinking < 2 liters per day. Taking all medications. Lives alone. Followed by Paramedicine.   Labs (2/14): SPEP negative, UPEP negative, HIV negative Labs (7/14): K 4, creatinine 1.0, LDL 66 Labs (08/25/12): K 4, creatinine 1.0 Labs (10/06/12) : K 3.3 creatinine 0.7 pro-bnp 188 Labs (11/15/12): K 4.4, creatinine 1.04, pro-BNP 468 Labs (3/15): K 4.3 Creatinine 0.83 Labs (5/15): K 4.1 Creatinine 0.85 Labs (9/16): K 4.3, creatinine 1.01 => 0.84, HCT 38.1, BNP 234 Labs (6/17): K 3.6, creatinine 0.78 Labs (4/9/20187): K 4.9 Creatinine 1.25.   PMH: 1. HTN 2. Type II diabetes 3. Nonischemic cardiomyopathy: ? Due to HTN versus LBBB CMP.   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  Echo (2/14) with EF 20-25%.  Echo (7/14) with EF 25%, diffuse hypokinesis.  HIV, SPEP, UPEP negative.  Has LBBB. CRT-D 10/2012 (Medtronic).  Echo (4/15) with EF 45-50%, mild diffuse hypokinesis, PA systolic pressure 38 mmHg.  Echo (6/16) with EF 40-45%, mild LVH, septal and inferior hypokinesis.   4. Chronic LBBB 5. Right TKR 6. Bilateral THR.  7. Hyperlipidemia 8. ?COPD: Has oxygen for use with exertion.  9. Atrial fibrillation: Paroxysmal.  10. PFTs (9/16) with FEV1 77%, FVC 76%, ratio 101%, TLC 63%, DLCO 41% => moderate restrictive deficit.  11. ECHO 03/3016 EF 30-35%. Grade 1 DD  SH: Prior smoker, quit 2/14.  Never drank ETOH.  No drugs. Lives with son.   FH: Mother with "heart trouble."   ROS: All systems reviewed and negative except as per HPI.   Current Outpatient Prescriptions  Medication Sig Dispense Refill  . acetaminophen (TYLENOL) 500 MG tablet Take 1 tablet (500 mg total) by mouth every 6 (six) hours as needed. (Patient taking differently: Take 500 mg by mouth every 6 (six) hours as needed for moderate pain. ) 30 tablet 0  . albuterol (PROVENTIL HFA;VENTOLIN HFA) 108 (90 Base) MCG/ACT inhaler Inhale 2 puffs into the lungs every 6 (six) hours as needed for wheezing or shortness of breath. 18 g 0  . amiodarone (PACERONE) 200 MG tablet Take 1 tablet (200 mg total) by mouth daily. 30 tablet 3  . apixaban (ELIQUIS) 5 MG TABS tablet Take 1  tablet (5 mg total) by mouth 2 (two) times daily. 60 tablet 3  . atorvastatin (LIPITOR) 40 MG tablet Take 1 tablet (40 mg total) by mouth daily. 30 tablet 3  . carvedilol (COREG) 3.125 MG tablet Take 1 tablet (3.125 mg total) by mouth 2 (two) times daily with a meal. 60 tablet 3  . furosemide (LASIX) 40 MG tablet Take 1 tablet (40 mg total) by mouth 2 (two) times daily. 60 tablet 3  . glucose blood (FREESTYLE TEST STRIPS) test strip Use as instructed 100 each 0  . glucose monitoring kit (FREESTYLE) monitoring  kit 1 each by Does not apply route 4 (four) times daily - after meals and at bedtime. 1 month Diabetic Testing Supplies for QAC-QHS accuchecks. 1 each 1  . ibuprofen (ADVIL,MOTRIN) 600 MG tablet Take 1 tablet (600 mg total) by mouth every 6 (six) hours as needed. 30 tablet 0  . insulin aspart (NOVOLOG FLEXPEN) 100 UNIT/ML FlexPen 0-20 Units, Subcutaneous, 3 times daily with meals CBG < 70: implement hypoglycemia protocol CBG 70 - 120: 0 units CBG 121 - 150: 3 units CBG 151 - 200: 4 units CBG 201 - 250: 7 units CBG 251 - 300: 11 units CBG 301 - 350: 15 units CBG 351 - 400: 20 units CBG > 400: call MD 15 mL 0  . Insulin Glargine (LANTUS SOLOSTAR) 100 UNIT/ML Solostar Pen Inject 55 Units into the skin daily. 15 mL 0  . Insulin Pen Needle 32G X 8 MM MISC Use as directed 100 each 0  . isosorbide-hydrALAZINE (BIDIL) 20-37.5 MG tablet Take 1 tablet by mouth 3 (three) times daily. 90 tablet 3  . Lancets (FREESTYLE) lancets Use as instructed 100 each 0  . metFORMIN (GLUCOPHAGE) 1000 MG tablet TAKE 1 TABLET(1000 MG) BY MOUTH TWICE DAILY WITH A MEAL 60 tablet 0  . oxyCODONE-acetaminophen (PERCOCET/ROXICET) 5-325 MG tablet Take 1 tablet by mouth every 6 (six) hours as needed for severe pain. 8 tablet 0  . sacubitril-valsartan (ENTRESTO) 49-51 MG Take 1 tablet by mouth 2 (two) times daily. 60 tablet 6  . spironolactone (ALDACTONE) 25 MG tablet Take 1 tablet (25 mg total) by mouth daily. 30 tablet 3   No current facility-administered medications for this encounter.     Vitals:   06/04/16 0859  BP: 128/66  Pulse: 86  SpO2: 99%  Weight: 218 lb (98.9 kg)   Filed Weights   06/04/16 0859  Weight: 218 lb (98.9 kg)   General:  Well appearing. No resp difficulty HEENT: normal Neck: supple. JVP 7-8.  Carotids 2+ bilat; no bruits. No lymphadenopathy or thryomegaly appreciated. Cor: PMI nondisplaced. Regular rate & rhythm. No rubs, gallops or murmurs. Lungs: clear Abdomen: soft, nontender,  nondistended. No hepatosplenomegaly. No bruits or masses. Good bowel sounds. Extremities: no cyanosis, clubbing, rash, edema Neuro: alert & orientedx3, cranial nerves grossly intact. moves all 4 extremities w/o difficulty. Affect pleasant  EKG: NSR 88 bpm. Personally reviewed.   Assessment/Plan: 1. Chronic systolic CHF: Nonischemic cardiomyopathy. S/P CRT-D (Medtronic).  ECHO  placed 10/14. EF 30-35%  03/2016  . Repeat ECHO after HF meds optimized.  NYHA class III symptoms.  Volume status stable.  Continue lasix 40 mg twice a day. Continue 25 mg spiro daily.  Continue coreg 3.125 mg twice a day.  Continue bidil at that current dose.  Increase entresto to 97-103 mg twice a day.  Discussed medication changes with [facility], Paramedicine.   2. Hyperlipidemia: Continue atorvastatin. On atorvastatin.   3. HTN:  Continue current regimen, .  4. Restrictive PFTs: Pattern is suggestive of a parenchymal problem, ?interstitial fibrosis.  I will get a high resolution CT of the chest (had ordered in the past but never done).   5. PAF:  Maintaining NSR. Stop amio. Continue eliquis twice a day.   Greater than 50% of the (total minutes 25) visit spent in counseling/coordination of care regarding heart failure and low salt diet.   Follow up in 2 weeks.    06/04/2016 NP-C

## 2016-06-04 NOTE — Telephone Encounter (Signed)
CSW requested to contact patient to offer resources for PCP as patient stated she was having difficulties getting an appointment to obtain her diabetic supplies. CSW contacted patient to offer PCP options although patient reported she did not need a PCP as she was getting her needs met and had all her diabetic supplies. CSW provided number for future need. Raquel Sarna, [place], [place]

## 2016-06-10 ENCOUNTER — Other Ambulatory Visit (HOSPITAL_COMMUNITY): Payer: Self-pay

## 2016-06-10 NOTE — Progress Notes (Signed)
Paramedicine Encounter    Patient ID: [name], female    DOB: 1950-04-29, 66 y.o.   MRN: [id number]    Patient Care Team: [name], MD as PCP - General (Family Medicine)  Patient Active Problem List   Diagnosis Date Noted  . Paroxysmal atrial fibrillation (HCC)   . Biventricular automatic implantable cardioverter defibrillator in situ   . Sepsis ([employer]) 04/20/2016  . UTI (urinary tract infection) 04/20/2016  . Dyspnea 07/19/2015  . Interstitial lung disease ([place]) 11/20/2014  . SIRS (systemic inflammatory response syndrome) ([place]) 02/03/2014  . DM (diabetes mellitus) ([place]) 02/03/2014  . Tachycardia 06/14/2013  . Chronic combined systolic and diastolic CHF (congestive heart failure) ([place]) 09/29/2012  . Hypoxia 03/06/2012  . Hypertensive heart disease 03/06/2012  . Nonischemic cardiomyopathy ([place]) 03/06/2012  . Tobacco abuse 03/06/2012  . Type 2 diabetes mellitus ([place]) 03/06/2012  . Hypertension 03/06/2012  . CAD (coronary artery disease), native coronary artery 03/06/2012  . Acute on chronic combined systolic and diastolic heart failure ([place]) 03/06/2012  . Hypokalemia 03/06/2012  . Acute bronchitis 02/01/2009  . Sleep apnea 02/01/2009  . Chest pain 02/01/2009    Current Outpatient Prescriptions:  .  albuterol (PROVENTIL HFA;VENTOLIN HFA) 108 (90 Base) MCG/ACT inhaler, Inhale 2 puffs into the lungs every 6 (six) hours as needed for wheezing or shortness of breath., Disp: 18 g, Rfl: 0 .  apixaban (ELIQUIS) 5 MG TABS tablet, Take 1 tablet (5 mg total) by mouth 2 (two) times daily., Disp: 60 tablet, Rfl: 3 .  atorvastatin (LIPITOR) 40 MG tablet, Take 1 tablet (40 mg total) by mouth daily., Disp: 30 tablet, Rfl: 3 .  carvedilol (COREG) 3.125 MG tablet, Take 1 tablet (3.125 mg total) by mouth 2 (two) times daily with a meal., Disp: 60 tablet, Rfl: 3 .  furosemide (LASIX) 40 MG tablet, Take 1 tablet (40 mg total) by mouth 2 (two) times daily., Disp: 60 tablet, Rfl: 3 .   glucose blood (FREESTYLE TEST STRIPS) test strip, Use as instructed, Disp: 100 each, Rfl: 0 .  glucose monitoring kit (FREESTYLE) monitoring kit, 1 each by Does not apply route 4 (four) times daily - after meals and at bedtime. 1 month Diabetic Testing Supplies for QAC-QHS accuchecks., Disp: 1 each, Rfl: 1 .  insulin aspart (NOVOLOG FLEXPEN) 100 UNIT/ML FlexPen, 0-20 Units, Subcutaneous, 3 times daily with meals CBG < 70: implement hypoglycemia protocol CBG 70 - 120: 0 units CBG 121 - 150: 3 units CBG 151 - 200: 4 units CBG 201 - 250: 7 units CBG 251 - 300: 11 units CBG 301 - 350: 15 units CBG 351 - 400: 20 units CBG > 400: call MD, Disp: 15 mL, Rfl: 0 .  Insulin Glargine (LANTUS SOLOSTAR) 100 UNIT/ML Solostar Pen, Inject 55 Units into the skin daily., Disp: 15 mL, Rfl: 0 .  Insulin Pen Needle 32G X 8 MM MISC, Use as directed, Disp: 100 each, Rfl: 0 .  isosorbide-hydrALAZINE (BIDIL) 20-37.5 MG tablet, Take 1 tablet by mouth 3 (three) times daily., Disp: 90 tablet, Rfl: 3 .  metFORMIN (GLUCOPHAGE) 1000 MG tablet, TAKE 1 TABLET(1000 MG) BY MOUTH TWICE DAILY WITH A MEAL, Disp: 60 tablet, Rfl: 0 .  sacubitril-valsartan (ENTRESTO) 97-103 MG, Take 1 tablet by mouth 2 (two) times daily., Disp: 60 tablet, Rfl: 6 .  spironolactone (ALDACTONE) 25 MG tablet, Take 1 tablet (25 mg total) by mouth daily., Disp: 30 tablet, Rfl: 3 .  acetaminophen (TYLENOL) 500 MG tablet, Take 1 tablet (500  mg total) by mouth every 6 (six) hours as needed. (Patient taking differently: Take 500 mg by mouth every 6 (six) hours as needed for moderate pain. ), Disp: 30 tablet, Rfl: 0 .  ibuprofen (ADVIL,MOTRIN) 600 MG tablet, Take 1 tablet (600 mg total) by mouth every 6 (six) hours as needed., Disp: 30 tablet, Rfl: 0 .  Lancets (FREESTYLE) lancets, Use as instructed, Disp: 100 each, Rfl: 0 .  oxyCODONE-acetaminophen (PERCOCET/ROXICET) 5-325 MG tablet, Take 1 tablet by mouth every 6 (six) hours as needed for severe pain., Disp: 8 tablet,  Rfl: 0 Allergies  Allergen Reactions  . Morphine And Related Nausea And Vomiting and Other (See Comments)    Severe nausea     Social History   Social History  . Marital status: Single    Spouse name: N/A  . Number of children: N/A  . Years of education: N/A   Occupational History  . Not on file.   Social History Main Topics  . Smoking status: Former Smoker    Packs/day: 1.00    Years: 40.00    Types: Cigarettes    Start date: 06/23/1972    Quit date: 03/26/2012  . Smokeless tobacco: Never Used  . Alcohol use No  . Drug use: No  . Sexual activity: Yes   Other Topics Concern  . Not on file   Social History Narrative  . No narrative on file    Physical Exam      SAFE - 05/02/16 1100      Situation   Heart failure history New   Comorbidities Atrial fibillation;COPD;DM;HTN   Readmitted within 30 days Yes   Hospital admission within past 12 months Yes   number of hospital admissions 2   number of ED visits 2     Assessment   Lives alone No   Primary support person sister   Mode of transportation personal car   Other services involved None   Home equipement Cane;Walker;Other     Weight   Weighs self daily No   Scale provided No   Records on weight chart No     Resources   Has "Living better w/heart failure" book Yes   Has HF Zone tool Yes   Able to identify yellow zone signs/when to call MD Yes   Records zone daily No     Medications   Uses a pill box Yes   Who stocks the pill box [facility]   Pill box checked this visit Yes   Pill box refilled this visit Yes   Difficulty obtaining medications No   Mail order medications No   Missed one or more doses of medications per week No     Nutrition   Patient receives meals on wheels No   Patient follows low sodium diet Yes   Has foods at home that meet the current recommended diet Yes   Patient follows low sugar/card diet Yes     Activity Level   ADL's/Mobility Independent   How many feet can patient  ambulate 75   Barriers knee pain at times   Actvity tolerance: NYHA Class 3     Urine   Difficulty urinating No   Changes in urine None     Time spent with patient   Time spent with patient  70 Minutes        Future Appointments Date Time Provider [place]  06/18/2016 9:00 AM [facility] PA/NP [facility] None    ATF pt oCAO x4 c/o left  leg pain.  Pt stated that she used arthiritis cream earlier which decreased the pain a lot. Pt has no other complaints today. Pt stated that she went to [place] this weekend to see her sister. Pt took her medications with her and tried to stay within the low sodium diet. Pts sister baked her a "sweet cake" for mothers day, cake with no icing.  Pt stated that she hasn't been eating a lot of sandwich meat lately.  Pt has been weighing herself and writing her weights down.  Pt denies sob, headache, dizziness, and chest pain today. Pt is on her way to her daughters house to spend time with her granddaughter. Pt's son is still out of town.  Pt has no issues with affording her medications. rx bottles verified and pill box refilled.   BP 118/60 (BP Location: Right Arm, Patient Position: Sitting, Cuff Size: Normal)   Pulse 95   Resp 16   Wt 214 lb 3.2 oz (97.2 kg)   SpO2 96%   BMI 33.55 kg/m  cbg 308  Weight yesterday-219.5 Last visit weight-218    Geno Sydnor, EMT Paramedic 06/10/2016   ACTION: Home visit completed Next visit planned for next wednesday

## 2016-06-13 ENCOUNTER — Other Ambulatory Visit (HOSPITAL_COMMUNITY): Payer: Self-pay

## 2016-06-13 NOTE — Progress Notes (Signed)
Paramedicine Encounter    Patient ID: [name], female    DOB: 08/22/1950, 66 y.o.   MRN: [id number]    Patient Care Team: [name], MD as PCP - General (Family Medicine)  Patient Active Problem List   Diagnosis Date Noted  . Paroxysmal atrial fibrillation (HCC)   . Biventricular automatic implantable cardioverter defibrillator in situ   . Sepsis ([place]) 04/20/2016  . UTI (urinary tract infection) 04/20/2016  . Dyspnea 07/19/2015  . Interstitial lung disease ([place]) 11/20/2014  . SIRS (systemic inflammatory response syndrome) ([place]) 02/03/2014  . DM (diabetes mellitus) ([place]) 02/03/2014  . Tachycardia 06/14/2013  . Chronic combined systolic and diastolic CHF (congestive heart failure) ([place]) 09/29/2012  . Hypoxia 03/06/2012  . Hypertensive heart disease 03/06/2012  . Nonischemic cardiomyopathy ([place]) 03/06/2012  . Tobacco abuse 03/06/2012  . Type 2 diabetes mellitus ([place]) 03/06/2012  . Hypertension 03/06/2012  . CAD (coronary artery disease), native coronary artery 03/06/2012  . Acute on chronic combined systolic and diastolic heart failure ([place]) 03/06/2012  . Hypokalemia 03/06/2012  . Acute bronchitis 02/01/2009  . Sleep apnea 02/01/2009  . Chest pain 02/01/2009    Current Outpatient Prescriptions:  .  acetaminophen (TYLENOL) 500 MG tablet, Take 1 tablet (500 mg total) by mouth every 6 (six) hours as needed. (Patient taking differently: Take 500 mg by mouth every 6 (six) hours as needed for moderate pain. ), Disp: 30 tablet, Rfl: 0 .  albuterol (PROVENTIL HFA;VENTOLIN HFA) 108 (90 Base) MCG/ACT inhaler, Inhale 2 puffs into the lungs every 6 (six) hours as needed for wheezing or shortness of breath., Disp: 18 g, Rfl: 0 .  apixaban (ELIQUIS) 5 MG TABS tablet, Take 1 tablet (5 mg total) by mouth 2 (two) times daily., Disp: 60 tablet, Rfl: 3 .  atorvastatin (LIPITOR) 40 MG tablet, Take 1 tablet (40 mg total) by mouth daily., Disp: 30 tablet, Rfl: 3 .  carvedilol (COREG)  3.125 MG tablet, Take 1 tablet (3.125 mg total) by mouth 2 (two) times daily with a meal., Disp: 60 tablet, Rfl: 3 .  furosemide (LASIX) 40 MG tablet, Take 1 tablet (40 mg total) by mouth 2 (two) times daily., Disp: 60 tablet, Rfl: 3 .  glucose blood (FREESTYLE TEST STRIPS) test strip, Use as instructed, Disp: 100 each, Rfl: 0 .  glucose monitoring kit (FREESTYLE) monitoring kit, 1 each by Does not apply route 4 (four) times daily - after meals and at bedtime. 1 month Diabetic Testing Supplies for QAC-QHS accuchecks., Disp: 1 each, Rfl: 1 .  ibuprofen (ADVIL,MOTRIN) 600 MG tablet, Take 1 tablet (600 mg total) by mouth every 6 (six) hours as needed., Disp: 30 tablet, Rfl: 0 .  insulin aspart (NOVOLOG FLEXPEN) 100 UNIT/ML FlexPen, 0-20 Units, Subcutaneous, 3 times daily with meals CBG < 70: implement hypoglycemia protocol CBG 70 - 120: 0 units CBG 121 - 150: 3 units CBG 151 - 200: 4 units CBG 201 - 250: 7 units CBG 251 - 300: 11 units CBG 301 - 350: 15 units CBG 351 - 400: 20 units CBG > 400: call MD, Disp: 15 mL, Rfl: 0 .  Insulin Glargine (LANTUS SOLOSTAR) 100 UNIT/ML Solostar Pen, Inject 55 Units into the skin daily., Disp: 15 mL, Rfl: 0 .  Insulin Pen Needle 32G X 8 MM MISC, Use as directed, Disp: 100 each, Rfl: 0 .  isosorbide-hydrALAZINE (BIDIL) 20-37.5 MG tablet, Take 1 tablet by mouth 3 (three) times daily., Disp: 90 tablet, Rfl: 3 .  Lancets (FREESTYLE) lancets,  Use as instructed, Disp: 100 each, Rfl: 0 .  metFORMIN (GLUCOPHAGE) 1000 MG tablet, TAKE 1 TABLET(1000 MG) BY MOUTH TWICE DAILY WITH A MEAL, Disp: 60 tablet, Rfl: 0 .  oxyCODONE-acetaminophen (PERCOCET/ROXICET) 5-325 MG tablet, Take 1 tablet by mouth every 6 (six) hours as needed for severe pain., Disp: 8 tablet, Rfl: 0 .  sacubitril-valsartan (ENTRESTO) 97-103 MG, Take 1 tablet by mouth 2 (two) times daily., Disp: 60 tablet, Rfl: 6 .  spironolactone (ALDACTONE) 25 MG tablet, Take 1 tablet (25 mg total) by mouth daily., Disp: 30 tablet,  Rfl: 3 Allergies  Allergen Reactions  . Morphine And Related Nausea And Vomiting and Other (See Comments)    Severe nausea     Social History   Social History  . Marital status: Single    Spouse name: N/A  . Number of children: N/A  . Years of education: N/A   Occupational History  . Not on file.   Social History Main Topics  . Smoking status: Former Smoker    Packs/day: 1.00    Years: 40.00    Types: Cigarettes    Start date: 06/23/1972    Quit date: 03/26/2012  . Smokeless tobacco: Never Used  . Alcohol use No  . Drug use: No  . Sexual activity: Yes   Other Topics Concern  . Not on file   Social History Narrative  . No narrative on file    Physical Exam      SAFE - 05/02/16 1100      Situation   Heart failure history New   Comorbidities Atrial fibillation;COPD;DM;HTN   Readmitted within 30 days Yes   Hospital admission within past 12 months Yes   number of hospital admissions 2   number of ED visits 2     Assessment   Lives alone No   Primary support person sister   Mode of transportation personal car   Other services involved None   Home equipement Cane;Walker;Other     Weight   Weighs self daily No   Scale provided No   Records on weight chart No     Resources   Has "Living better w/heart failure" book Yes   Has HF Zone tool Yes   Able to identify yellow zone signs/when to call MD Yes   Records zone daily No     Medications   Uses a pill box Yes   Who stocks the pill box [facility]   Pill box checked this visit Yes   Pill box refilled this visit Yes   Difficulty obtaining medications No   Mail order medications No   Missed one or more doses of medications per week No     Nutrition   Patient receives meals on wheels No   Patient follows low sodium diet Yes   Has foods at home that meet the current recommended diet Yes   Patient follows low sugar/card diet Yes     Activity Level   ADL's/Mobility Independent   How many feet can patient  ambulate 75   Barriers knee pain at times   Actvity tolerance: NYHA Class 3     Urine   Difficulty urinating No   Changes in urine None     Time spent with patient   Time spent with patient  66 Minutes        Future Appointments Date Time Provider [place]  06/18/2016 9:00 AM [facility] PA/NP [facility] None    ATF pt CAO x4 with no  complaints.  Pt called today and requested [facility]. She has ran out of her needles to check her CBG and she need to take insulin.  Pt is on sliding scale. Pt vitals noted.   BP 124/70 (BP Location: Right Arm, Patient Position: Sitting, Cuff Size: Normal)   Pulse 91   SpO2 97%   cbg [place] [name], [name] 06/13/2016    ACTION: Home visit completed

## 2016-06-18 ENCOUNTER — Other Ambulatory Visit (HOSPITAL_COMMUNITY): Payer: Self-pay | Admitting: Pharmacist

## 2016-06-18 ENCOUNTER — Ambulatory Visit (HOSPITAL_COMMUNITY)
Admission: RE | Admit: 2016-06-18 | Discharge: 2016-06-18 | Disposition: A | Discharge status: Home / Self Care | Payer: Medicare Other | Source: Ambulatory Visit | Attending: Internal Medicine | Admitting: Internal Medicine

## 2016-06-18 ENCOUNTER — Other Ambulatory Visit (HOSPITAL_COMMUNITY): Payer: Self-pay

## 2016-06-18 VITALS — BP 120/68 | HR 95 | Wt 218.2 lb

## 2016-06-18 DIAGNOSIS — Z794 Long term (current) use of insulin: Secondary | ICD-10-CM | POA: Insufficient documentation

## 2016-06-18 DIAGNOSIS — Z79899 Other long term (current) drug therapy: Secondary | ICD-10-CM | POA: Insufficient documentation

## 2016-06-18 DIAGNOSIS — Z7901 Long term (current) use of anticoagulants: Secondary | ICD-10-CM | POA: Insufficient documentation

## 2016-06-18 DIAGNOSIS — E119 Type 2 diabetes mellitus without complications: Secondary | ICD-10-CM | POA: Diagnosis not present

## 2016-06-18 DIAGNOSIS — I428 Other cardiomyopathies: Secondary | ICD-10-CM | POA: Diagnosis not present

## 2016-06-18 DIAGNOSIS — I5042 Chronic combined systolic (congestive) and diastolic (congestive) heart failure: Secondary | ICD-10-CM | POA: Diagnosis present

## 2016-06-18 DIAGNOSIS — E785 Hyperlipidemia, unspecified: Secondary | ICD-10-CM | POA: Insufficient documentation

## 2016-06-18 DIAGNOSIS — Z95810 Presence of automatic (implantable) cardiac defibrillator: Secondary | ICD-10-CM | POA: Diagnosis not present

## 2016-06-18 DIAGNOSIS — I480 Paroxysmal atrial fibrillation: Secondary | ICD-10-CM

## 2016-06-18 DIAGNOSIS — I5022 Chronic systolic (congestive) heart failure: Secondary | ICD-10-CM | POA: Insufficient documentation

## 2016-06-18 DIAGNOSIS — I110 Hypertensive heart disease with heart failure: Secondary | ICD-10-CM | POA: Insufficient documentation

## 2016-06-18 DIAGNOSIS — Z87891 Personal history of nicotine dependence: Secondary | ICD-10-CM | POA: Diagnosis not present

## 2016-06-18 LAB — COMPREHENSIVE METABOLIC PANEL
ALT: 8 U/L — ABNORMAL LOW (ref 14–54)
AST: 12 U/L — ABNORMAL LOW (ref 15–41)
Albumin: 3.9 g/dL (ref 3.5–5.0)
Alkaline Phosphatase: 64 U/L (ref 38–126)
Anion gap: 10 (ref 5–15)
BUN: 22 mg/dL — ABNORMAL HIGH (ref 6–20)
CO2: 26 mmol/L (ref 22–32)
Calcium: 9.2 mg/dL (ref 8.9–10.3)
Chloride: 102 mmol/L (ref 101–111)
Creatinine, Ser: 1.16 mg/dL — ABNORMAL HIGH (ref 0.44–1.00)
GFR calc Af Amer: 57 mL/min — ABNORMAL LOW (ref >60)
GFR calc non Af Amer: 49 mL/min — ABNORMAL LOW (ref >60)
Glucose, Bld: 241 mg/dL — ABNORMAL HIGH (ref 65–99)
Potassium: 4.1 mmol/L (ref 3.5–5.1)
Sodium: 138 mmol/L (ref 135–145)
Total Bilirubin: 0.6 mg/dL (ref 0.3–1.2)
Total Protein: 7.2 g/dL (ref 6.5–8.1)

## 2016-06-18 LAB — TSH: TSH: 2.127 u[IU]/mL (ref 0.350–4.500)

## 2016-06-18 MED ORDER — CARVEDILOL 6.25 MG PO TABS: 6.2500 mg | ORAL_TABLET | Freq: Two times a day (BID) | ORAL | 6 refills | Status: DC

## 2016-06-18 MED ORDER — FREESTYLE LANCETS MISC: 0 refills | Status: AC

## 2016-06-18 MED ORDER — INSULIN PEN NEEDLE 32G X 8 MM MISC: 0 refills | Status: AC

## 2016-06-18 NOTE — Progress Notes (Signed)
Paramedicine Encounter    Patient ID: [name], female    DOB: 18-Nov-1950, 66 y.o.   MRN: [id number]    Patient Care Team: [name], MD as PCP - General (Family Medicine)  Patient Active Problem List   Diagnosis Date Noted  . Paroxysmal atrial fibrillation (HCC)   . Biventricular automatic implantable cardioverter defibrillator in situ   . Sepsis ([place]) 04/20/2016  . UTI (urinary tract infection) 04/20/2016  . Dyspnea 07/19/2015  . Interstitial lung disease ([place]) 11/20/2014  . SIRS (systemic inflammatory response syndrome) ([place]) 02/03/2014  . DM (diabetes mellitus) ([place]) 02/03/2014  . Tachycardia 06/14/2013  . Chronic combined systolic and diastolic CHF (congestive heart failure) ([place]) 09/29/2012  . Hypoxia 03/06/2012  . Hypertensive heart disease 03/06/2012  . Nonischemic cardiomyopathy ([place]) 03/06/2012  . Tobacco abuse 03/06/2012  . Type 2 diabetes mellitus ([place]) 03/06/2012  . Hypertension 03/06/2012  . CAD (coronary artery disease), native coronary artery 03/06/2012  . Acute on chronic combined systolic and diastolic heart failure ([place]) 03/06/2012  . Hypokalemia 03/06/2012  . Acute bronchitis 02/01/2009  . Sleep apnea 02/01/2009  . Chest pain 02/01/2009    Current Outpatient Prescriptions:  .  acetaminophen (TYLENOL) 500 MG tablet, Take 1 tablet (500 mg total) by mouth every 6 (six) hours as needed. (Patient taking differently: Take 500 mg by mouth every 6 (six) hours as needed for moderate pain. ), Disp: 30 tablet, Rfl: 0 .  albuterol (PROVENTIL HFA;VENTOLIN HFA) 108 (90 Base) MCG/ACT inhaler, Inhale 2 puffs into the lungs every 6 (six) hours as needed for wheezing or shortness of breath., Disp: 18 g, Rfl: 0 .  apixaban (ELIQUIS) 5 MG TABS tablet, Take 1 tablet (5 mg total) by mouth 2 (two) times daily., Disp: 60 tablet, Rfl: 3 .  atorvastatin (LIPITOR) 40 MG tablet, Take 1 tablet (40 mg total) by mouth daily., Disp: 30 tablet, Rfl: 3 .  carvedilol (COREG)  3.125 MG tablet, Take 1 tablet (3.125 mg total) by mouth 2 (two) times daily with a meal., Disp: 60 tablet, Rfl: 3 .  furosemide (LASIX) 40 MG tablet, Take 1 tablet (40 mg total) by mouth 2 (two) times daily., Disp: 60 tablet, Rfl: 3 .  glucose blood (FREESTYLE TEST STRIPS) test strip, Use as instructed, Disp: 100 each, Rfl: 0 .  glucose monitoring kit (FREESTYLE) monitoring kit, 1 each by Does not apply route 4 (four) times daily - after meals and at bedtime. 1 month Diabetic Testing Supplies for QAC-QHS accuchecks., Disp: 1 each, Rfl: 1 .  ibuprofen (ADVIL,MOTRIN) 600 MG tablet, Take 1 tablet (600 mg total) by mouth every 6 (six) hours as needed., Disp: 30 tablet, Rfl: 0 .  insulin aspart (NOVOLOG FLEXPEN) 100 UNIT/ML FlexPen, 0-20 Units, Subcutaneous, 3 times daily with meals CBG < 70: implement hypoglycemia protocol CBG 70 - 120: 0 units CBG 121 - 150: 3 units CBG 151 - 200: 4 units CBG 201 - 250: 7 units CBG 251 - 300: 11 units CBG 301 - 350: 15 units CBG 351 - 400: 20 units CBG > 400: call MD, Disp: 15 mL, Rfl: 0 .  Insulin Glargine (LANTUS SOLOSTAR) 100 UNIT/ML Solostar Pen, Inject 55 Units into the skin daily., Disp: 15 mL, Rfl: 0 .  Insulin Pen Needle 32G X 8 MM MISC, Use as directed, Disp: 100 each, Rfl: 0 .  isosorbide-hydrALAZINE (BIDIL) 20-37.5 MG tablet, Take 1 tablet by mouth 3 (three) times daily., Disp: 90 tablet, Rfl: 3 .  Lancets (FREESTYLE) lancets,  Use as instructed, Disp: 100 each, Rfl: 0 .  metFORMIN (GLUCOPHAGE) 1000 MG tablet, TAKE 1 TABLET(1000 MG) BY MOUTH TWICE DAILY WITH A MEAL, Disp: 60 tablet, Rfl: 0 .  oxyCODONE-acetaminophen (PERCOCET/ROXICET) 5-325 MG tablet, Take 1 tablet by mouth every 6 (six) hours as needed for severe pain., Disp: 8 tablet, Rfl: 0 .  sacubitril-valsartan (ENTRESTO) 97-103 MG, Take 1 tablet by mouth 2 (two) times daily., Disp: 60 tablet, Rfl: 6 .  spironolactone (ALDACTONE) 25 MG tablet, Take 1 tablet (25 mg total) by mouth daily., Disp: 30 tablet,  Rfl: 3 Allergies  Allergen Reactions  . Morphine And Related Nausea And Vomiting and Other (See Comments)    Severe nausea     Social History   Social History  . Marital status: Single    Spouse name: N/A  . Number of children: N/A  . Years of education: N/A   Occupational History  . Not on file.   Social History Main Topics  . Smoking status: Former Smoker    Packs/day: 1.00    Years: 40.00    Types: Cigarettes    Start date: 06/23/1972    Quit date: 03/26/2012  . Smokeless tobacco: Never Used  . Alcohol use No  . Drug use: No  . Sexual activity: Yes   Other Topics Concern  . Not on file   Social History Narrative  . No narrative on file    Physical Exam  Pulmonary/Chest: No respiratory distress. She has no wheezes. She has no rales.  Musculoskeletal: She exhibits no edema.  Skin: Skin is warm and dry. She is not diaphoretic.        SAFE - 05/02/16 1100      Situation   Heart failure history New   Comorbidities Atrial fibillation;COPD;DM;HTN   Readmitted within 30 days Yes   Hospital admission within past 12 months Yes   number of hospital admissions 2   number of ED visits 2     Assessment   Lives alone No   Primary support person sister   Mode of transportation personal car   Other services involved None   Home equipement Cane;Walker;Other     Weight   Weighs self daily No   Scale provided No   Records on weight chart No     Resources   Has "Living better w/heart failure" book Yes   Has HF Zone tool Yes   Able to identify yellow zone signs/when to call MD Yes   Records zone daily No     Medications   Uses a pill box Yes   Who stocks the pill box [facility]   Pill box checked this visit Yes   Pill box refilled this visit Yes   Difficulty obtaining medications No   Mail order medications No   Missed one or more doses of medications per week No     Nutrition   Patient receives meals on wheels No   Patient follows low sodium diet Yes   Has  foods at home that meet the current recommended diet Yes   Patient follows low sugar/card diet Yes     Activity Level   ADL's/Mobility Independent   How many feet can patient ambulate 75   Barriers knee pain at times   Actvity tolerance: NYHA Class 3     Urine   Difficulty urinating No   Changes in urine None     Time spent with patient   Time spent with patient  25  Minutes       Paramedicine Encounter   Patient ID: [name] , female,   DOB: Jul 18, 1950,65 y.o.,  MRN: [id number]   Met patient in clinic today with provider Erin.  Pt has no complaints today. Pt denies sob, dizziness, chest pain and headache.  She hasn't had any dizziness for a couple of weeks.  Pt has taken all of her medications without difficulty.  she is still out of lancets so she has not taken insulin. She took the rest of her meds today. Junie Panning wrote her a prescription for the lancets during this visit and pt will pick them up once she leaves the clinic.   Junie Panning stated that pt had a "run of a-fib" and she would like pt to go back on amiodarone.  She will confirm with amy due to amy just taking her off during the last visit.  I will f/u with pt if needed. rx bottles verified and pill box refilled during clinic visit today.   Time spent with patient 35 mins.  cbg 341 **rx changed: Coreg increased 6.25 Go back amiodarone  **rx called in: eliquis entresto 97-103  Tauna Macfarlane, EMT-Paramedic 06/18/2016   ACTION: Next visit planned for next wednesday

## 2016-06-18 NOTE — Patient Instructions (Addendum)
INCREASE Carvedilol (Coreg) to 6.25 mg twice daily. Can "double up" on current 3.125 mg tablets (Take 2 tabs twice daily). New Rx has been sent to your pharmacy for 6.25 mg tablets (Take 1 tab twice daily).  Lancets refilled to pharmacy.  Routine lab work today. Will notify you of abnormal results, otherwise no news is good news!  Will schedule you for sleep study at [facility]. Address: [address], [place], [place] [address] Phone: [phone]  ___________________________________________________  ___________________________________________________   Follow up 2 weeks with echocardiogram.  Do the following things EVERYDAY: 1) Weigh yourself in the morning before breakfast. Write it down and keep it in a log. 2) Take your medicines as prescribed 3) Eat low salt foods-Limit salt (sodium) to 2000 mg per day.  4) Stay as active as you can everyday 5) Limit all fluids for the day to less than 2 liters

## 2016-06-18 NOTE — Progress Notes (Signed)
Patient ID: [name], female   DOB: 31-Oct-1950, 66 y.o.   MRN: [id number]   ADVANCED HF CLINIC NOTE  PCP: Dr. Alyson Ingles Cardiology: Dr Aundra Dubin  6 yo with history of nonischemic cardiomyopathy.  She was admitted with CHF exacerbation in 02/2012.  EF 20-25% on echo, LHC with nonobstructive CAD.  She was started on cardiac meds and discharged.  In 07/2012, she was admitted again with CHF exacerbation.  She had run out of Lasix.  She was taking her other heart medications as ordered, however.  She was diuresed and discharged. She had a chronic LBBB, and Medtronic CRT-D device was placed in 10/14.  She was admitted in 6/16 with hypertensive emergency and CHF exacerbation.   Also of note, she had PFTs in 9/16 showing restrictive spirometry with low lung volume and DLCO.  She was supposed to get a high resolution CT to evaluate for interstitial lung disease but never had the study.     Admitted March 2018 with urosepsis--> E Coli Bacteremia. Completed antibiotic course. EF was down from previous. Also had  A fib so she was loaded on amio. Placed on eliquis. Discharge weight was 208 pounds.   She presents today for HF follow up. Feeling well, no SOB. Weights at home 216-218 pounds. Followed by paramedicine. No SOB with walking into clinic, does some light exercise at night without SOB. No orthopnea or PND. Eating tomato sandwiches, some high salt foods. Drinking less than 2L a day. Denies dizziness and orthostatic hypotension. Denies chest pain and palpitations. She does endorse snoring. She has had diarrhea for 24 hours.   Optivol: Fluid elevated but stable. 2 episodes of Afib. No VT  Labs (2/14): SPEP negative, UPEP negative, HIV negative Labs (7/14): K 4, creatinine 1.0, LDL 66 Labs (08/25/12): K 4, creatinine 1.0 Labs (10/06/12) : K 3.3 creatinine 0.7 pro-bnp 188 Labs (11/15/12): K 4.4, creatinine 1.04, pro-BNP 468 Labs (3/15): K 4.3 Creatinine 0.83 Labs (5/15): K 4.1 Creatinine 0.85 Labs (9/16):  K 4.3, creatinine 1.01 => 0.84, HCT 38.1, BNP 234 Labs (6/17): K 3.6, creatinine 0.78 Labs (4/9/20187): K 4.9 Creatinine 1.25.   PMH: 1. HTN 2. Type II diabetes 3. Nonischemic cardiomyopathy: ? Due to HTN versus LBBB CMP.  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  Echo (2/14) with EF 20-25%.  Echo (7/14) with EF 25%, diffuse hypokinesis.  HIV, SPEP, UPEP negative.  Has LBBB. CRT-D 10/2012 (Medtronic).  Echo (4/15) with EF 45-50%, mild diffuse hypokinesis, PA systolic pressure 38 mmHg.  Echo (6/16) with EF 40-45%, mild LVH, septal and inferior hypokinesis.   4. Chronic LBBB 5. Right TKR 6. Bilateral THR.  7. Hyperlipidemia 8. ?COPD: Has oxygen for use with exertion.  9. Atrial fibrillation: Paroxysmal.  10. PFTs (9/16) with FEV1 77%, FVC 76%, ratio 101%, TLC 63%, DLCO 41% => moderate restrictive deficit.  11. ECHO 03/3016 EF 30-35%. Grade 1 DD  SH: Prior smoker, quit 2/14.  Never drank ETOH.  No drugs. Lives with son.   FH: Mother with "heart trouble."   ROS: All systems reviewed and negative except as per HPI.   Current Outpatient Prescriptions  Medication Sig Dispense Refill  . acetaminophen (TYLENOL) 500 MG tablet Take 1 tablet (500 mg total) by mouth every 6 (six) hours as needed. (Patient taking differently: Take 500 mg by mouth every 6 (six) hours as needed for moderate pain. ) 30 tablet 0  . albuterol (PROVENTIL HFA;VENTOLIN HFA) 108 (90 Base) MCG/ACT inhaler Inhale 2 puffs into the lungs  every 6 (six) hours as needed for wheezing or shortness of breath. 18 g 0  . apixaban (ELIQUIS) 5 MG TABS tablet Take 1 tablet (5 mg total) by mouth 2 (two) times daily. 60 tablet 3  . atorvastatin (LIPITOR) 40 MG tablet Take 1 tablet (40 mg total) by mouth daily. 30 tablet 3  . carvedilol (COREG) 3.125 MG tablet Take 1 tablet (3.125 mg total) by mouth 2 (two) times daily with a meal. 60 tablet 3  . furosemide (LASIX) 40 MG tablet Take 1 tablet (40 mg total) by mouth 2 (two) times daily. 60 tablet 3    . glucose blood (FREESTYLE TEST STRIPS) test strip Use as instructed 100 each 0  . glucose monitoring kit (FREESTYLE) monitoring kit 1 each by Does not apply route 4 (four) times daily - after meals and at bedtime. 1 month Diabetic Testing Supplies for QAC-QHS accuchecks. 1 each 1  . ibuprofen (ADVIL,MOTRIN) 600 MG tablet Take 1 tablet (600 mg total) by mouth every 6 (six) hours as needed. 30 tablet 0  . insulin aspart (NOVOLOG FLEXPEN) 100 UNIT/ML FlexPen 0-20 Units, Subcutaneous, 3 times daily with meals CBG < 70: implement hypoglycemia protocol CBG 70 - 120: 0 units CBG 121 - 150: 3 units CBG 151 - 200: 4 units CBG 201 - 250: 7 units CBG 251 - 300: 11 units CBG 301 - 350: 15 units CBG 351 - 400: 20 units CBG > 400: call MD 15 mL 0  . Insulin Glargine (LANTUS SOLOSTAR) 100 UNIT/ML Solostar Pen Inject 55 Units into the skin daily. 15 mL 0  . Insulin Pen Needle 32G X 8 MM MISC Use as directed 100 each 0  . isosorbide-hydrALAZINE (BIDIL) 20-37.5 MG tablet Take 1 tablet by mouth 3 (three) times daily. 90 tablet 3  . Lancets (FREESTYLE) lancets Use as instructed 100 each 0  . metFORMIN (GLUCOPHAGE) 1000 MG tablet TAKE 1 TABLET(1000 MG) BY MOUTH TWICE DAILY WITH A MEAL 60 tablet 0  . oxyCODONE-acetaminophen (PERCOCET/ROXICET) 5-325 MG tablet Take 1 tablet by mouth every 6 (six) hours as needed for severe pain. 8 tablet 0  . sacubitril-valsartan (ENTRESTO) 97-103 MG Take 1 tablet by mouth 2 (two) times daily. 60 tablet 6  . spironolactone (ALDACTONE) 25 MG tablet Take 1 tablet (25 mg total) by mouth daily. 30 tablet 3   No current facility-administered medications for this encounter.     Vitals:   06/18/16 0844  BP: 120/68  Pulse: 95  SpO2: 99%  Weight: 218 lb 4 oz (99 kg)   General:  Well appearing female, NAD. Ambulated into clinic without difficulty.  HEENT: normal Neck: supple. JVP 6-7 cm. Carotids 2+ bilat; no bruits. No lymphadenopathy or thryomegaly appreciated. Cor: PMI  nondisplaced. Regular rate and rhythm. No rubs, gallops or murmurs. Lungs: Clear bilaterally. No wheeze. Normal effort.  Abdomen: obese, soft, nontender, nondistended. No hepatosplenomegaly. No bruits or masses. + Bowel sounds.  Extremities: no cyanosis, clubbing, rash. No edema.  Neuro: alert & orientedx3, cranial nerves grossly intact. moves all 4 extremities w/o difficulty. Affect pleasant  Assessment/Plan: 1. Chronic systolic CHF: Nonischemic cardiomyopathy. S/P CRT-D (Medtronic). Echo 03/2016 EF 30-35%.  - NYHA class II - Volume status stable on exam. By Leanne Lovely looks like her volume is up some, but with GI illness will not increase diuretic today. She knows to take extra lasix if weight increases more than 3 pounds in one day.  - Continue Lasix 44m BID. She knows to take an extra  lasix if her weight increases.  - Increase Coreg to 6.74m BID, resting HR is 95 and she had 2 episodes of Afib on her device interrogation.  - Continue Entresto 97/103 mg BID.  - Continue Bidil 1 tab TID - Dee with paramedicine was present for visit and medication changes were discussed.  2. Hyperlipidemia - Continue atorvastatin.   3. HTN:  - Well controlled on current regimen. Increase Coreg as above.   4. Restrictive PFTs: Pattern is suggestive of a parenchymal problem, ?interstitial fibrosis.   - Chest CT with ground glass opacities.  - Amiodarone stopped earlier this month.  5. PAF: - Amio stopped as above.  - has had one episode of Afib since, Dr. BHaroldine Lawsreviewed, no need to restart Amio.  - Will increase Coreg as above.    BMET today, return in 4 weeks with Echo. I refilled her lancets per her request today.    EArbutus LeasNP-C  06/18/2016   Patient seen and examined with EJettie Booze NP. We discussed all aspects of the encounter. I agree with the assessment and plan as stated above.   Overall doing well. Volume status stable on exam. Slightly up on Optivol. (ICD interrogated personally)  Also two episodes of AF.  Agree with increasing carvedilol. Continue Paramedicine f/u.   BGlori Bickers MD  6:14 PM

## 2016-06-26 ENCOUNTER — Other Ambulatory Visit (HOSPITAL_COMMUNITY): Payer: Self-pay

## 2016-06-26 NOTE — Progress Notes (Signed)
Paramedicine Encounter    Patient ID: [name], female    DOB: 10/21/1950, 66 y.o.   MRN: [id number]    Patient Care Team: [name], MD as PCP - General (Family Medicine)  Patient Active Problem List   Diagnosis Date Noted  . Paroxysmal atrial fibrillation (HCC)   . Biventricular automatic implantable cardioverter defibrillator in situ   . Sepsis ([place]) 04/20/2016  . UTI (urinary tract infection) 04/20/2016  . Dyspnea 07/19/2015  . Interstitial lung disease ([place]) 11/20/2014  . SIRS (systemic inflammatory response syndrome) ([place]) 02/03/2014  . DM (diabetes mellitus) ([place]) 02/03/2014  . Tachycardia 06/14/2013  . Chronic combined systolic and diastolic CHF (congestive heart failure) ([place]) 09/29/2012  . Hypoxia 03/06/2012  . Hypertensive heart disease 03/06/2012  . Nonischemic cardiomyopathy ([place]) 03/06/2012  . Tobacco abuse 03/06/2012  . Type 2 diabetes mellitus ([place]) 03/06/2012  . Hypertension 03/06/2012  . CAD (coronary artery disease), native coronary artery 03/06/2012  . Acute on chronic combined systolic and diastolic heart failure ([place]) 03/06/2012  . Hypokalemia 03/06/2012  . Acute bronchitis 02/01/2009  . Sleep apnea 02/01/2009  . Chest pain 02/01/2009    Current Outpatient Prescriptions:  .  albuterol (PROVENTIL HFA;VENTOLIN HFA) 108 (90 Base) MCG/ACT inhaler, Inhale 2 puffs into the lungs every 6 (six) hours as needed for wheezing or shortness of breath., Disp: 18 g, Rfl: 0 .  apixaban (ELIQUIS) 5 MG TABS tablet, Take 1 tablet (5 mg total) by mouth 2 (two) times daily., Disp: 60 tablet, Rfl: 3 .  atorvastatin (LIPITOR) 40 MG tablet, Take 1 tablet (40 mg total) by mouth daily., Disp: 30 tablet, Rfl: 3 .  carvedilol (COREG) 6.25 MG tablet, Take 1 tablet (6.25 mg total) by mouth 2 (two) times daily with a meal., Disp: 60 tablet, Rfl: 6 .  furosemide (LASIX) 40 MG tablet, Take 1 tablet (40 mg total) by mouth 2 (two) times daily., Disp: 60 tablet, Rfl: 3 .   glucose blood (FREESTYLE TEST STRIPS) test strip, Use as instructed, Disp: 100 each, Rfl: 0 .  glucose monitoring kit (FREESTYLE) monitoring kit, 1 each by Does not apply route 4 (four) times daily - after meals and at bedtime. 1 month Diabetic Testing Supplies for QAC-QHS accuchecks., Disp: 1 each, Rfl: 1 .  insulin aspart (NOVOLOG FLEXPEN) 100 UNIT/ML FlexPen, 0-20 Units, Subcutaneous, 3 times daily with meals CBG < 70: implement hypoglycemia protocol CBG 70 - 120: 0 units CBG 121 - 150: 3 units CBG 151 - 200: 4 units CBG 201 - 250: 7 units CBG 251 - 300: 11 units CBG 301 - 350: 15 units CBG 351 - 400: 20 units CBG > 400: call MD, Disp: 15 mL, Rfl: 0 .  Insulin Glargine (LANTUS SOLOSTAR) 100 UNIT/ML Solostar Pen, Inject 55 Units into the skin daily., Disp: 15 mL, Rfl: 0 .  isosorbide-hydrALAZINE (BIDIL) 20-37.5 MG tablet, Take 1 tablet by mouth 3 (three) times daily., Disp: 90 tablet, Rfl: 3 .  metFORMIN (GLUCOPHAGE) 1000 MG tablet, TAKE 1 TABLET(1000 MG) BY MOUTH TWICE DAILY WITH A MEAL, Disp: 60 tablet, Rfl: 0 .  sacubitril-valsartan (ENTRESTO) 97-103 MG, Take 1 tablet by mouth 2 (two) times daily., Disp: 60 tablet, Rfl: 6 .  spironolactone (ALDACTONE) 25 MG tablet, Take 1 tablet (25 mg total) by mouth daily., Disp: 30 tablet, Rfl: 3 .  acetaminophen (TYLENOL) 500 MG tablet, Take 1 tablet (500 mg total) by mouth every 6 (six) hours as needed. (Patient taking differently: Take 500 mg by mouth  every 6 (six) hours as needed for moderate pain. ), Disp: 30 tablet, Rfl: 0 .  ibuprofen (ADVIL,MOTRIN) 600 MG tablet, Take 1 tablet (600 mg total) by mouth every 6 (six) hours as needed., Disp: 30 tablet, Rfl: 0 .  Insulin Pen Needle 32G X 8 MM MISC, Use as directed, Disp: 100 each, Rfl: 0 .  Lancets (FREESTYLE) lancets, Use as instructed, Disp: 100 each, Rfl: 0 .  oxyCODONE-acetaminophen (PERCOCET/ROXICET) 5-325 MG tablet, Take 1 tablet by mouth every 6 (six) hours as needed for severe pain., Disp: 8 tablet,  Rfl: 0 Allergies  Allergen Reactions  . Morphine And Related Nausea And Vomiting and Other (See Comments)    Severe nausea     Social History   Social History  . Marital status: Single    Spouse name: N/A  . Number of children: N/A  . Years of education: N/A   Occupational History  . Not on file.   Social History Main Topics  . Smoking status: Former Smoker    Packs/day: 1.00    Years: 40.00    Types: Cigarettes    Start date: 06/23/1972    Quit date: 03/26/2012  . Smokeless tobacco: Never Used  . Alcohol use No  . Drug use: No  . Sexual activity: Yes   Other Topics Concern  . Not on file   Social History Narrative  . No narrative on file    Physical Exam  Pulmonary/Chest: No respiratory distress. She has no wheezes. She has no rales.  Abdominal: She exhibits no distension. There is no tenderness. There is no guarding.  Musculoskeletal: She exhibits no edema.  Skin: Skin is warm and dry. She is not diaphoretic.        SAFE - 05/02/16 1100      Situation   Heart failure history New   Comorbidities Atrial fibillation;COPD;DM;HTN   Readmitted within 30 days Yes   Hospital admission within past 12 months Yes   number of hospital admissions 2   number of ED visits 2     Assessment   Lives alone No   Primary support person sister   Mode of transportation personal car   Other services involved None   Home equipement Cane;Walker;Other     Weight   Weighs self daily No   Scale provided No   Records on weight chart No     Resources   Has "Living better w/heart failure" book Yes   Has HF Zone tool Yes   Able to identify yellow zone signs/when to call MD Yes   Records zone daily No     Medications   Uses a pill box Yes   Who stocks the pill box [facility]   Pill box checked this visit Yes   Pill box refilled this visit Yes   Difficulty obtaining medications No   Mail order medications No   Missed one or more doses of medications per week No     Nutrition    Patient receives meals on wheels No   Patient follows low sodium diet Yes   Has foods at home that meet the current recommended diet Yes   Patient follows low sugar/card diet Yes     Activity Level   ADL's/Mobility Independent   How many feet can patient ambulate 75   Barriers knee pain at times   Actvity tolerance: NYHA Class 3     Urine   Difficulty urinating No   Changes in urine None  Time spent with patient   Time spent with patient  31 Minutes        Future Appointments Date Time Provider [place]  07/15/2016 9:00 AM MC ECHO 1-BUZZ [facility]ECH[facility]  07/15/2016 10:00 AM MC-HVSC PA/NP MC-HVSC None  08/20/2016 8:00 PM [facility]-S[facility]EL ROOM 4 [facility]-SLEEL [facility]    ATF pt CAO x4 doing work around the house.  She was folding clothes and had already cleaned her room.  She stated that she has a lot of energy today and she feels really good.  She denies sob, headache, and chest pain.  She expressed desire to change her primary physician due to him being unavailable when she needs him.  She ate a bologna sandwich earlier and doesn't know what she will eat today. Pt advised that her CBG levels has been "running good".  She hasn't missed any medications this week.  rx bottles verified and pill box refilled.   cbg 139 rx called in: *eliquis filled until Monday morning/no pills in mon eveing-thurs mornin *bidil *metformin *entresto (only in fri)   BP (!) 104/58 (BP Location: Right Arm, Patient Position: Sitting, Cuff Size: Normal)   Pulse (!) 108   Resp 16   Wt 202 lb (91.6 kg)   SpO2 98%   BMI 31.64 kg/m   Weight yesterday-202 Last visit weight-200    [name], EMT Paramedic 06/26/2016    ACTION: Home visit completed

## 2016-07-08 ENCOUNTER — Other Ambulatory Visit (HOSPITAL_COMMUNITY): Payer: Self-pay

## 2016-07-08 NOTE — Progress Notes (Signed)
Paramedicine Encounter    Patient ID: [name], female    DOB: 07/03/1950, 66 y.o.   MRN: [id number]    Patient Care Team: [name], MD as PCP - General (Family Medicine)  Patient Active Problem List   Diagnosis Date Noted  . Paroxysmal atrial fibrillation (HCC)   . Biventricular automatic implantable cardioverter defibrillator in situ   . Sepsis ([place]) 04/20/2016  . UTI (urinary tract infection) 04/20/2016  . Dyspnea 07/19/2015  . Interstitial lung disease ([place]) 11/20/2014  . SIRS (systemic inflammatory response syndrome) ([place]) 02/03/2014  . DM (diabetes mellitus) ([place]) 02/03/2014  . Tachycardia 06/14/2013  . Chronic combined systolic and diastolic CHF (congestive heart failure) ([place]) 09/29/2012  . Hypoxia 03/06/2012  . Hypertensive heart disease 03/06/2012  . Nonischemic cardiomyopathy ([place]) 03/06/2012  . Tobacco abuse 03/06/2012  . Type 2 diabetes mellitus ([place]) 03/06/2012  . Hypertension 03/06/2012  . CAD (coronary artery disease), native coronary artery 03/06/2012  . Acute on chronic combined systolic and diastolic heart failure ([place]) 03/06/2012  . Hypokalemia 03/06/2012  . Acute bronchitis 02/01/2009  . Sleep apnea 02/01/2009  . Chest pain 02/01/2009    Current Outpatient Prescriptions:  .  apixaban (ELIQUIS) 5 MG TABS tablet, Take 1 tablet (5 mg total) by mouth 2 (two) times daily., Disp: 60 tablet, Rfl: 3 .  atorvastatin (LIPITOR) 40 MG tablet, Take 1 tablet (40 mg total) by mouth daily., Disp: 30 tablet, Rfl: 3 .  carvedilol (COREG) 6.25 MG tablet, Take 1 tablet (6.25 mg total) by mouth 2 (two) times daily with a meal., Disp: 60 tablet, Rfl: 6 .  furosemide (LASIX) 40 MG tablet, Take 1 tablet (40 mg total) by mouth 2 (two) times daily., Disp: 60 tablet, Rfl: 3 .  glucose blood (FREESTYLE TEST STRIPS) test strip, Use as instructed, Disp: 100 each, Rfl: 0 .  glucose monitoring kit (FREESTYLE) monitoring kit, 1 each by Does not apply route 4 (four) times  daily - after meals and at bedtime. 1 month Diabetic Testing Supplies for QAC-QHS accuchecks., Disp: 1 each, Rfl: 1 .  insulin aspart (NOVOLOG FLEXPEN) 100 UNIT/ML FlexPen, 0-20 Units, Subcutaneous, 3 times daily with meals CBG < 70: implement hypoglycemia protocol CBG 70 - 120: 0 units CBG 121 - 150: 3 units CBG 151 - 200: 4 units CBG 201 - 250: 7 units CBG 251 - 300: 11 units CBG 301 - 350: 15 units CBG 351 - 400: 20 units CBG > 400: call MD, Disp: 15 mL, Rfl: 0 .  Insulin Glargine (LANTUS SOLOSTAR) 100 UNIT/ML Solostar Pen, Inject 55 Units into the skin daily., Disp: 15 mL, Rfl: 0 .  Insulin Pen Needle 32G X 8 MM MISC, Use as directed, Disp: 100 each, Rfl: 0 .  isosorbide-hydrALAZINE (BIDIL) 20-37.5 MG tablet, Take 1 tablet by mouth 3 (three) times daily., Disp: 90 tablet, Rfl: 3 .  metFORMIN (GLUCOPHAGE) 1000 MG tablet, TAKE 1 TABLET(1000 MG) BY MOUTH TWICE DAILY WITH A MEAL, Disp: 60 tablet, Rfl: 0 .  sacubitril-valsartan (ENTRESTO) 97-103 MG, Take 1 tablet by mouth 2 (two) times daily., Disp: 60 tablet, Rfl: 6 .  acetaminophen (TYLENOL) 500 MG tablet, Take 1 tablet (500 mg total) by mouth every 6 (six) hours as needed. (Patient taking differently: Take 500 mg by mouth every 6 (six) hours as needed for moderate pain. ), Disp: 30 tablet, Rfl: 0 .  albuterol (PROVENTIL HFA;VENTOLIN HFA) 108 (90 Base) MCG/ACT inhaler, Inhale 2 puffs into the lungs every 6 (six) hours as  needed for wheezing or shortness of breath., Disp: 18 g, Rfl: 0 .  ibuprofen (ADVIL,MOTRIN) 600 MG tablet, Take 1 tablet (600 mg total) by mouth every 6 (six) hours as needed., Disp: 30 tablet, Rfl: 0 .  Lancets (FREESTYLE) lancets, Use as instructed, Disp: 100 each, Rfl: 0 .  oxyCODONE-acetaminophen (PERCOCET/ROXICET) 5-325 MG tablet, Take 1 tablet by mouth every 6 (six) hours as needed for severe pain., Disp: 8 tablet, Rfl: 0 .  spironolactone (ALDACTONE) 25 MG tablet, Take 1 tablet (25 mg total) by mouth daily., Disp: 30 tablet, Rfl:  3 Allergies  Allergen Reactions  . Morphine And Related Nausea And Vomiting and Other (See Comments)    Severe nausea     Social History   Social History  . Marital status: Single    Spouse name: N/A  . Number of children: N/A  . Years of education: N/A   Occupational History  . Not on file.   Social History Main Topics  . Smoking status: Former Smoker    Packs/day: 1.00    Years: 40.00    Types: Cigarettes    Start date: 06/23/1972    Quit date: 03/26/2012  . Smokeless tobacco: Never Used  . Alcohol use No  . Drug use: No  . Sexual activity: Yes   Other Topics Concern  . Not on file   Social History Narrative  . No narrative on file    Physical Exam  Eyes: Pupils are equal, round, and reactive to light.  Pulmonary/Chest: No respiratory distress. She has no wheezes. She has no rales.  Abdominal: She exhibits no distension. There is no tenderness. There is no guarding.  Musculoskeletal: She exhibits no edema.  Skin: Skin is warm and dry. She is not diaphoretic.        Future Appointments Date Time Provider Department Center  07/15/2016 9:00 AM MC ECHO 1-BUZZ [facility]  07/15/2016 10:00 AM MC-[facility] PA/NP [facility] None  08/20/2016 8:00 PM [facility] ROOM 4 [facility]    ATF pt CAO x4 sitting on her bed with no complaints. Pt missed last weeks appointment due to a doctors appointment. She filled her own pill box last which had a few pills out of sequence.  The pill box was corrected and refilled.  pts weight was 21 lbs heavier than last visit but considering pt's inability to read the scale I believe the correct weight during our last visit may have been 220 instead of 202 (heart clinic made aware of the same). She denies sob, dizziness, headache and chest pain.  She just took her morning medications prior to our visit.  Her cbg was 468 but she hadn't taken insulin yet.  She ate a biscuit and large sweet tea.  We discussed her diet again.  rx bottles verified and  pill box refilled.    I will talk with daphne about pt possibly getting a "talking scale" to eliminate the weight issue again.    BP 136/76 (BP Location: Right Arm, Patient Position: Sitting, Cuff Size: Normal)   Pulse 79   Resp 16   Wt 223 lb 3.2 oz (101.2 kg)   SpO2 98%   BMI 34.96 kg/m   cbg 468 Weight yesterday-didn't weigh Last visit weight-202 (possibly 220)    Demetria Copper, EMT Paramedic 07/08/2016    ACTION: Home visit completed Next visit planned for next tues      

## 2016-07-15 ENCOUNTER — Ambulatory Visit (HOSPITAL_BASED_OUTPATIENT_CLINIC_OR_DEPARTMENT_OTHER)
Admission: RE | Admit: 2016-07-15 | Discharge: 2016-07-15 | Disposition: A | Discharge status: Home / Self Care | Payer: Medicare Other | Source: Ambulatory Visit | Attending: Internal Medicine | Admitting: Internal Medicine

## 2016-07-15 ENCOUNTER — Other Ambulatory Visit (HOSPITAL_COMMUNITY): Payer: Self-pay

## 2016-07-15 ENCOUNTER — Ambulatory Visit (HOSPITAL_COMMUNITY)
Admission: RE | Admit: 2016-07-15 | Discharge: 2016-07-15 | Disposition: A | Discharge status: Home / Self Care | Payer: Medicare Other | Source: Ambulatory Visit | Attending: Cardiology | Admitting: Cardiology

## 2016-07-15 ENCOUNTER — Encounter (HOSPITAL_COMMUNITY): Payer: Self-pay

## 2016-07-15 VITALS — BP 154/90 | HR 95 | Wt 223.6 lb

## 2016-07-15 DIAGNOSIS — I5042 Chronic combined systolic (congestive) and diastolic (congestive) heart failure: Secondary | ICD-10-CM | POA: Diagnosis not present

## 2016-07-15 DIAGNOSIS — Z794 Long term (current) use of insulin: Secondary | ICD-10-CM | POA: Diagnosis not present

## 2016-07-15 DIAGNOSIS — I429 Cardiomyopathy, unspecified: Secondary | ICD-10-CM | POA: Insufficient documentation

## 2016-07-15 DIAGNOSIS — I480 Paroxysmal atrial fibrillation: Secondary | ICD-10-CM | POA: Insufficient documentation

## 2016-07-15 DIAGNOSIS — E785 Hyperlipidemia, unspecified: Secondary | ICD-10-CM | POA: Insufficient documentation

## 2016-07-15 DIAGNOSIS — I2510 Atherosclerotic heart disease of native coronary artery without angina pectoris: Secondary | ICD-10-CM | POA: Insufficient documentation

## 2016-07-15 DIAGNOSIS — I5022 Chronic systolic (congestive) heart failure: Secondary | ICD-10-CM | POA: Insufficient documentation

## 2016-07-15 DIAGNOSIS — E119 Type 2 diabetes mellitus without complications: Secondary | ICD-10-CM | POA: Insufficient documentation

## 2016-07-15 DIAGNOSIS — Z79899 Other long term (current) drug therapy: Secondary | ICD-10-CM | POA: Insufficient documentation

## 2016-07-15 DIAGNOSIS — I110 Hypertensive heart disease with heart failure: Secondary | ICD-10-CM | POA: Insufficient documentation

## 2016-07-15 DIAGNOSIS — R002 Palpitations: Secondary | ICD-10-CM | POA: Insufficient documentation

## 2016-07-15 LAB — BASIC METABOLIC PANEL
Anion gap: 9 (ref 5–15)
BUN: 17 mg/dL (ref 6–20)
CO2: 30 mmol/L (ref 22–32)
Calcium: 9.6 mg/dL (ref 8.9–10.3)
Chloride: 99 mmol/L — ABNORMAL LOW (ref 101–111)
Creatinine, Ser: 1.01 mg/dL — ABNORMAL HIGH (ref 0.44–1.00)
GFR calc Af Amer: >60 mL/min (ref >60)
GFR calc non Af Amer: 58 mL/min — ABNORMAL LOW (ref >60)
Glucose, Bld: 317 mg/dL — ABNORMAL HIGH (ref 65–99)
Potassium: 3.7 mmol/L (ref 3.5–5.1)
Sodium: 138 mmol/L (ref 135–145)

## 2016-07-15 LAB — BRAIN NATRIURETIC PEPTIDE: B Natriuretic Peptide: 45.2 pg/mL (ref 0.0–100.0)

## 2016-07-15 MED ORDER — CARVEDILOL 12.5 MG PO TABS: 12.5000 mg | ORAL_TABLET | Freq: Two times a day (BID) | ORAL | 3 refills | Status: DC

## 2016-07-15 NOTE — Progress Notes (Signed)
Patient ID: [name], female   DOB: April 06, 1950, 66 y.o.   MRN: [id number]   ADVANCED HF CLINIC NOTE  PCP: Dr. Alyson Ingles Cardiology: Dr Aundra Dubin  50 yo with history of nonischemic cardiomyopathy.  She was admitted with CHF exacerbation in 02/2012.  EF 20-25% on echo, LHC with nonobstructive CAD.  She was started on cardiac meds and discharged.  In 07/2012, she was admitted again with CHF exacerbation.  She had run out of Lasix.  She was taking her other heart medications as ordered, however.  She was diuresed and discharged. She had a chronic LBBB, and Medtronic CRT-D device was placed in 10/14.  She was admitted in 6/16 with hypertensive emergency and CHF exacerbation.   Also of note, she had PFTs in 9/16 showing restrictive spirometry with low lung volume and DLCO.  She was supposed to get a high resolution CT to evaluate for interstitial lung disease but never had the study.     Admitted March 2018 with urosepsis--> E Coli Bacteremia. Completed antibiotic course. EF was down from previous to 30-35%. Also had atrial fibrillation so she was loaded on amiodarone. Placed on eliquis. Discharge weight was 208 pounds.  She is now off amiodarone.   She presents today for HF follow up.  Followed by paramedicine.  Weight is up about 5 lbs but she is feeling well.  No tachypalpitations, in NSR today.  BP is elevated, tends to run SBP 130s at home. She is walking some for exercise.  She is short of breath with stairs but no problems walking on flat ground.  No orthopnea/PND.  No chest pain.  No lightheadedness.  Echo was done today and reviewed, EF up to 45-50%.    Labs (2/14): SPEP negative, UPEP negative, HIV negative Labs (7/14): K 4, creatinine 1.0, LDL 66 Labs (08/25/12): K 4, creatinine 1.0 Labs (10/06/12) : K 3.3 creatinine 0.7 pro-bnp 188 Labs (11/15/12): K 4.4, creatinine 1.04, pro-BNP 468 Labs (3/15): K 4.3 Creatinine 0.83 Labs (5/15): K 4.1 Creatinine 0.85 Labs (9/16): K 4.3, creatinine 1.01 =>  0.84, HCT 38.1, BNP 234 Labs (6/17): K 3.6, creatinine 0.78 Labs (05/05/2016): K 4.9 Creatinine 1.25. Labs (5/18): TSH normal, LFTs, normal, K 4.1, creatinine 1.16   PMH: 1. HTN 2. Type II diabetes 3. Nonischemic cardiomyopathy: ? Due to HTN versus LBBB CMP.  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  Echo (2/14) with EF 20-25%.  Echo (7/14) with EF 25%, diffuse hypokinesis.  HIV, SPEP, UPEP negative.  Has LBBB. CRT-D 10/2012 (Medtronic).  Echo (4/15) with EF 45-50%, mild diffuse hypokinesis, PA systolic pressure 38 mmHg.  Echo (6/16) with EF 40-45%, mild LVH, septal and inferior hypokinesis.   - Echo (3/18): EF 30-35%. Grade 1 DD - Echo (6/18): EF 45-50%, moderate LVH, normal RV size with mildly decreased systolic function.  4. Chronic LBBB 5. Right TKR 6. Bilateral THR.  7. Hyperlipidemia 8. ?COPD: Has oxygen for use with exertion.  - PFTs (9/16) with FEV1 77%, FVC 76%, ratio 101%, TLC 63%, DLCO 41% => moderate restrictive deficit  9. Atrial fibrillation: Paroxysmal.   SH: Prior smoker, quit 2/14.  Never drank ETOH.  No drugs. Lives with son.   FH: Mother with "heart trouble."   ROS: All systems reviewed and negative except as per HPI.   Current Outpatient Prescriptions  Medication Sig Dispense Refill  . acetaminophen (TYLENOL) 500 MG tablet Take 1 tablet (500 mg total) by mouth every 6 (six) hours as needed. (Patient taking differently: Take 500  mg by mouth every 6 (six) hours as needed for moderate pain. ) 30 tablet 0  . albuterol (PROVENTIL HFA;VENTOLIN HFA) 108 (90 Base) MCG/ACT inhaler Inhale 2 puffs into the lungs every 6 (six) hours as needed for wheezing or shortness of breath. 18 g 0  . apixaban (ELIQUIS) 5 MG TABS tablet Take 1 tablet (5 mg total) by mouth 2 (two) times daily. 60 tablet 3  . atorvastatin (LIPITOR) 40 MG tablet Take 1 tablet (40 mg total) by mouth daily. 30 tablet 3  . carvedilol (COREG) 12.5 MG tablet Take 1 tablet (12.5 mg total) by mouth 2 (two) times daily with  a meal. 60 tablet 3  . furosemide (LASIX) 40 MG tablet Take 1 tablet (40 mg total) by mouth 2 (two) times daily. 60 tablet 3  . glucose blood (FREESTYLE TEST STRIPS) test strip Use as instructed 100 each 0  . glucose monitoring kit (FREESTYLE) monitoring kit 1 each by Does not apply route 4 (four) times daily - after meals and at bedtime. 1 month Diabetic Testing Supplies for QAC-QHS accuchecks. 1 each 1  . ibuprofen (ADVIL,MOTRIN) 600 MG tablet Take 1 tablet (600 mg total) by mouth every 6 (six) hours as needed. 30 tablet 0  . insulin aspart (NOVOLOG FLEXPEN) 100 UNIT/ML FlexPen 0-20 Units, Subcutaneous, 3 times daily with meals CBG < 70: implement hypoglycemia protocol CBG 70 - 120: 0 units CBG 121 - 150: 3 units CBG 151 - 200: 4 units CBG 201 - 250: 7 units CBG 251 - 300: 11 units CBG 301 - 350: 15 units CBG 351 - 400: 20 units CBG > 400: call MD 15 mL 0  . Insulin Glargine (LANTUS SOLOSTAR) 100 UNIT/ML Solostar Pen Inject 55 Units into the skin daily. 15 mL 0  . Insulin Pen Needle 32G X 8 MM MISC Use as directed 100 each 0  . isosorbide-hydrALAZINE (BIDIL) 20-37.5 MG tablet Take 1 tablet by mouth 3 (three) times daily. 90 tablet 3  . Lancets (FREESTYLE) lancets Use as instructed 100 each 0  . metFORMIN (GLUCOPHAGE) 1000 MG tablet TAKE 1 TABLET(1000 MG) BY MOUTH TWICE DAILY WITH A MEAL 60 tablet 0  . oxyCODONE-acetaminophen (PERCOCET/ROXICET) 5-325 MG tablet Take 1 tablet by mouth every 6 (six) hours as needed for severe pain. 8 tablet 0  . sacubitril-valsartan (ENTRESTO) 97-103 MG Take 1 tablet by mouth 2 (two) times daily. 60 tablet 6  . spironolactone (ALDACTONE) 25 MG tablet Take 1 tablet (25 mg total) by mouth daily. 30 tablet 3   No current facility-administered medications for this encounter.     Vitals:   07/15/16 0948  BP: (!) 154/90  Pulse: 95  SpO2: 97%  Weight: 223 lb 9.6 oz (101.4 kg)   General:  NAD HEENT: normal Neck: supple. JVP not elevated. Carotids 2+ bilat;  no bruits. No lymphadenopathy or thryomegaly appreciated. Cor: PMI nondisplaced. Regular rate and rhythm. No rubs, gallops or murmurs. Lungs: Clear to auscultation bilaterally.  Abdomen: obese, soft, nontender, nondistended. No hepatosplenomegaly. No bruits or masses. + Bowel sounds.  Extremities: no cyanosis, clubbing, rash. No edema.  Neuro: alert & orientedx3, cranial nerves grossly intact. moves all 4 extremities w/o difficulty. Affect pleasant  Assessment/Plan: 1. Chronic systolic CHF: Nonischemic cardiomyopathy. S/P CRT-D (Medtronic). Echo 03/2016 EF 30-35%.  Echo was done today and reviewed, EF up to 45-50%.  NYHA class II.  She is not volume overloaded on exam though weight is up some. BP elevated.  - Continue Lasix 40  mg BID. She can take an extra 40 mg if needed.  BMET/BNP today.  - Increase Coreg to 12.5 mg bid.  - Continue Entresto 97/103 mg bid.  - Continue Bidil 1 tab tid.  [name] with paramedicine was present for visit and medication changes were discussed.  2. Hyperlipidemia: Continue atorvastatin.   3. HTN: Mildly elevated BP, increasing Coreg as above.  4. PAF: Now off amiodarone.  In NSR today, minimal palpitations now.  - Continue apixaban.   Followup in 3 months.   [name], MD  2:38 PM  07/15/2016

## 2016-07-15 NOTE — Progress Notes (Signed)
  Echocardiogram 2D Echocardiogram has been performed.  Darlina Sicilian M 07/15/2016, 9:36 AM

## 2016-07-15 NOTE — Progress Notes (Signed)
Paramedicine Encounter   Patient ID: [name] , female,   DOB: [birth year],65 y.o.,  MRN: [id number]   Met patient in clinic today with provider dr.mclean.  Pt stated that she isn't sleeping good throughout the night due to urinating during the night. She has been taking her medications without difficulty.  She refilled her pill box prior to coming today, which was filled correctly.  Dr. Aundra Dubin stated that pt EF has increased to around 45-50.  Pt denies sob except walking up steps. Pt's b/p is elevated therefore he increased the carvedilol, no other changes at this time. Pt will return in 3 months.    Time spent with patient 25 mins  **Increased 12.5 carvedilol   Jabree Rebert, EMT-Paramedic 07/15/2016   ACTION: Next visit planned for next wednesday

## 2016-07-15 NOTE — Patient Instructions (Signed)
INCREASE Coreg 12.5 mg, one tab twice a day   Labs today We will only contact you if something comes back abnormal or we need to make some changes. Otherwise no news is good news!  Your physician recommends that you schedule a follow-up appointment in: 3 months with Dr Aundra Dubin  BE SURE TO TAKE ALL YOUR MEDICATIONS THE MORNING OF YOUR NEXT APPOINTMENT AND REMEMBER TO  BRING ALL YOUR BOTTLES TO EVERY APPOINTMENT  Do the following things EVERYDAY: 1) Weigh yourself in the morning before breakfast. Write it down and keep it in a log. 2) Take your medicines as prescribed 3) Eat low salt foods-Limit salt (sodium) to 2000 mg per day.  4) Stay as active as you can everyday 5) Limit all fluids for the day to less than 2 liters

## 2016-07-22 ENCOUNTER — Other Ambulatory Visit: Payer: Self-pay | Admitting: Internal Medicine

## 2016-08-01 ENCOUNTER — Other Ambulatory Visit (HOSPITAL_COMMUNITY): Payer: Self-pay

## 2016-08-01 NOTE — Progress Notes (Signed)
Paramedicine Encounter    Patient ID: [name], female    DOB: 25-Apr-1950, 66 y.o.   MRN: [id number]    Patient Care Team: [name], MD as PCP - General (Family Medicine)  Patient Active Problem List   Diagnosis Date Noted  . Paroxysmal atrial fibrillation (HCC)   . Biventricular automatic implantable cardioverter defibrillator in situ   . Sepsis ([place]) 04/20/2016  . UTI (urinary tract infection) 04/20/2016  . Dyspnea 07/19/2015  . Interstitial lung disease ([place]) 11/20/2014  . SIRS (systemic inflammatory response syndrome) ([place]) 02/03/2014  . DM (diabetes mellitus) ([place]) 02/03/2014  . Tachycardia 06/14/2013  . Chronic combined systolic and diastolic CHF (congestive heart failure) ([place]) 09/29/2012  . Hypoxia 03/06/2012  . Hypertensive heart disease 03/06/2012  . Nonischemic cardiomyopathy ([place]) 03/06/2012  . Tobacco abuse 03/06/2012  . Type 2 diabetes mellitus ([place]) 03/06/2012  . Hypertension 03/06/2012  . CAD (coronary artery disease), native coronary artery 03/06/2012  . Acute on chronic combined systolic and diastolic heart failure ([place]) 03/06/2012  . Hypokalemia 03/06/2012  . Acute bronchitis 02/01/2009  . Sleep apnea 02/01/2009  . Chest pain 02/01/2009    Current Outpatient Prescriptions:  .  apixaban (ELIQUIS) 5 MG TABS tablet, Take 1 tablet (5 mg total) by mouth 2 (two) times daily., Disp: 60 tablet, Rfl: 3 .  atorvastatin (LIPITOR) 40 MG tablet, Take 1 tablet (40 mg total) by mouth daily., Disp: 30 tablet, Rfl: 3 .  carvedilol (COREG) 12.5 MG tablet, Take 1 tablet (12.5 mg total) by mouth 2 (two) times daily with a meal., Disp: 60 tablet, Rfl: 3 .  furosemide (LASIX) 40 MG tablet, Take 1 tablet (40 mg total) by mouth 2 (two) times daily., Disp: 60 tablet, Rfl: 3 .  isosorbide-hydrALAZINE (BIDIL) 20-37.5 MG tablet, Take 1 tablet by mouth 3 (three) times daily., Disp: 90 tablet, Rfl: 3 .  metFORMIN (GLUCOPHAGE) 1000 MG tablet, TAKE 1 TABLET(1000 MG) BY MOUTH  TWICE DAILY WITH A MEAL, Disp: 60 tablet, Rfl: 0 .  sacubitril-valsartan (ENTRESTO) 97-103 MG, Take 1 tablet by mouth 2 (two) times daily., Disp: 60 tablet, Rfl: 6 .  spironolactone (ALDACTONE) 25 MG tablet, Take 1 tablet (25 mg total) by mouth daily., Disp: 30 tablet, Rfl: 3 .  acetaminophen (TYLENOL) 500 MG tablet, Take 1 tablet (500 mg total) by mouth every 6 (six) hours as needed. (Patient taking differently: Take 500 mg by mouth every 6 (six) hours as needed for moderate pain. ), Disp: 30 tablet, Rfl: 0 .  albuterol (PROVENTIL HFA;VENTOLIN HFA) 108 (90 Base) MCG/ACT inhaler, Inhale 2 puffs into the lungs every 6 (six) hours as needed for wheezing or shortness of breath., Disp: 18 g, Rfl: 0 .  glucose blood (FREESTYLE TEST STRIPS) test strip, Use as instructed, Disp: 100 each, Rfl: 0 .  glucose monitoring kit (FREESTYLE) monitoring kit, 1 each by Does not apply route 4 (four) times daily - after meals and at bedtime. 1 month Diabetic Testing Supplies for QAC-QHS accuchecks., Disp: 1 each, Rfl: 1 .  ibuprofen (ADVIL,MOTRIN) 600 MG tablet, Take 1 tablet (600 mg total) by mouth every 6 (six) hours as needed., Disp: 30 tablet, Rfl: 0 .  insulin aspart (NOVOLOG FLEXPEN) 100 UNIT/ML FlexPen, 0-20 Units, Subcutaneous, 3 times daily with meals CBG < 70: implement hypoglycemia protocol CBG 70 - 120: 0 units CBG 121 - 150: 3 units CBG 151 - 200: 4 units CBG 201 - 250: 7 units CBG 251 - 300: 11 units CBG 301 -  350: 15 units CBG 351 - 400: 20 units CBG > 400: call MD, Disp: 15 mL, Rfl: 0 .  Insulin Glargine (LANTUS SOLOSTAR) 100 UNIT/ML Solostar Pen, Inject 55 Units into the skin daily., Disp: 15 mL, Rfl: 0 .  Insulin Pen Needle 32G X 8 MM MISC, Use as directed, Disp: 100 each, Rfl: 0 .  Lancets (FREESTYLE) lancets, Use as instructed, Disp: 100 each, Rfl: 0 .  oxyCODONE-acetaminophen (PERCOCET/ROXICET) 5-325 MG tablet, Take 1 tablet by mouth every 6 (six) hours as needed for severe pain., Disp: 8 tablet, Rfl:  0 Allergies  Allergen Reactions  . Morphine And Related Nausea And Vomiting and Other (See Comments)    Severe nausea     Social History   Social History  . Marital status: Single    Spouse name: N/A  . Number of children: N/A  . Years of education: N/A   Occupational History  . Not on file.   Social History Main Topics  . Smoking status: Former Smoker    Packs/day: 1.00    Years: 40.00    Types: Cigarettes    Start date: 06/23/1972    Quit date: 03/26/2012  . Smokeless tobacco: Never Used  . Alcohol use No  . Drug use: No  . Sexual activity: Yes   Other Topics Concern  . Not on file   Social History Narrative  . No narrative on file    Physical Exam  Pulmonary/Chest: No respiratory distress. She has no wheezes. She has no rales.  Abdominal: She exhibits no distension. There is no tenderness. There is no guarding.  Musculoskeletal: She exhibits no edema.  Skin: Skin is warm and dry. She is not diaphoretic.        Future Appointments Date Time Provider [place]  08/20/2016 8:00 PM [facility] 4 [facility]    ATF pt CAO x4 c/o dizziness eariler this morning but has gone away.  After looking at her pill box that she done herself, I noticed that she had spirolactone in the am slot.  Due to her feeling dizzy before, I usually put them in the evening slot for her to take before bed.  I explained the same to her.  I picked up about 8 medications from the pharmacy this am because pt couldn't drive.  She denies sob, dizziness, and pain at this time. She is continuing to watch what she eats and she hasn't missed any meds this week.  rx bottles verified and pill box refilled.  BP 108/68   Pulse 68   Resp 14   Wt 226 lb 9.6 oz (102.8 kg)   SpO2 98%   BMI 35.49 kg/m   cbg 236  **rx called in: bidil filled until thurs morning/no pills in thurs aftn and even only Metformin completely fill insulin Weight yesterday-224 Last visit  weight-223    [name], EMT Paramedic 08/01/2016    ACTION: Home visit completed Next visit planned for next thursday

## 2016-08-07 ENCOUNTER — Other Ambulatory Visit (HOSPITAL_COMMUNITY): Payer: Self-pay

## 2016-08-07 ENCOUNTER — Other Ambulatory Visit (HOSPITAL_COMMUNITY): Payer: Self-pay | Admitting: Student

## 2016-08-07 NOTE — Progress Notes (Signed)
Paramedicine Encounter    Patient ID: [name], female    DOB: 31-Oct-1950, 66 y.o.   MRN: [id number]    Patient Care Team: [name], MD as PCP - General (Family Medicine)  Patient Active Problem List   Diagnosis Date Noted  . Paroxysmal atrial fibrillation (HCC)   . Biventricular automatic implantable cardioverter defibrillator in situ   . Sepsis ([place]) 04/20/2016  . UTI (urinary tract infection) 04/20/2016  . Dyspnea 07/19/2015  . Interstitial lung disease ([place]) 11/20/2014  . SIRS (systemic inflammatory response syndrome) ([place]) 02/03/2014  . DM (diabetes mellitus) ([place]) 02/03/2014  . Tachycardia 06/14/2013  . Chronic combined systolic and diastolic CHF (congestive heart failure) ([place]) 09/29/2012  . Hypoxia 03/06/2012  . Hypertensive heart disease 03/06/2012  . Nonischemic cardiomyopathy ([place]) 03/06/2012  . Tobacco abuse 03/06/2012  . Type 2 diabetes mellitus ([place]) 03/06/2012  . Hypertension 03/06/2012  . CAD (coronary artery disease), native coronary artery 03/06/2012  . Acute on chronic combined systolic and diastolic heart failure ([place]) 03/06/2012  . Hypokalemia 03/06/2012  . Acute bronchitis 02/01/2009  . Sleep apnea 02/01/2009  . Chest pain 02/01/2009    Current Outpatient Prescriptions:  .  apixaban (ELIQUIS) 5 MG TABS tablet, Take 1 tablet (5 mg total) by mouth 2 (two) times daily., Disp: 60 tablet, Rfl: 3 .  atorvastatin (LIPITOR) 40 MG tablet, Take 1 tablet (40 mg total) by mouth daily., Disp: 30 tablet, Rfl: 3 .  carvedilol (COREG) 12.5 MG tablet, Take 1 tablet (12.5 mg total) by mouth 2 (two) times daily with a meal., Disp: 60 tablet, Rfl: 3 .  furosemide (LASIX) 40 MG tablet, Take 1 tablet (40 mg total) by mouth 2 (two) times daily., Disp: 60 tablet, Rfl: 3 .  glucose blood (FREESTYLE TEST STRIPS) test strip, Use as instructed, Disp: 100 each, Rfl: 0 .  glucose monitoring kit (FREESTYLE) monitoring kit, 1 each by Does not apply route 4 (four) times  daily - after meals and at bedtime. 1 month Diabetic Testing Supplies for QAC-QHS accuchecks., Disp: 1 each, Rfl: 1 .  insulin aspart (NOVOLOG FLEXPEN) 100 UNIT/ML FlexPen, 0-20 Units, Subcutaneous, 3 times daily with meals CBG < 70: implement hypoglycemia protocol CBG 70 - 120: 0 units CBG 121 - 150: 3 units CBG 151 - 200: 4 units CBG 201 - 250: 7 units CBG 251 - 300: 11 units CBG 301 - 350: 15 units CBG 351 - 400: 20 units CBG > 400: call MD, Disp: 15 mL, Rfl: 0 .  Insulin Glargine (LANTUS SOLOSTAR) 100 UNIT/ML Solostar Pen, Inject 55 Units into the skin daily., Disp: 15 mL, Rfl: 0 .  Insulin Pen Needle 32G X 8 MM MISC, Use as directed, Disp: 100 each, Rfl: 0 .  isosorbide-hydrALAZINE (BIDIL) 20-37.5 MG tablet, Take 1 tablet by mouth 3 (three) times daily., Disp: 90 tablet, Rfl: 3 .  metFORMIN (GLUCOPHAGE) 1000 MG tablet, TAKE 1 TABLET(1000 MG) BY MOUTH TWICE DAILY WITH A MEAL, Disp: 60 tablet, Rfl: 0 .  sacubitril-valsartan (ENTRESTO) 97-103 MG, Take 1 tablet by mouth 2 (two) times daily., Disp: 60 tablet, Rfl: 6 .  spironolactone (ALDACTONE) 25 MG tablet, Take 1 tablet (25 mg total) by mouth daily., Disp: 30 tablet, Rfl: 3 .  acetaminophen (TYLENOL) 500 MG tablet, Take 1 tablet (500 mg total) by mouth every 6 (six) hours as needed. (Patient taking differently: Take 500 mg by mouth every 6 (six) hours as needed for moderate pain. ), Disp: 30 tablet, Rfl: 0 .  albuterol (PROVENTIL HFA;VENTOLIN HFA) 108 (90 Base) MCG/ACT inhaler, Inhale 2 puffs into the lungs every 6 (six) hours as needed for wheezing or shortness of breath., Disp: 18 g, Rfl: 0 .  ibuprofen (ADVIL,MOTRIN) 600 MG tablet, Take 1 tablet (600 mg total) by mouth every 6 (six) hours as needed., Disp: 30 tablet, Rfl: 0 .  Lancets (FREESTYLE) lancets, Use as instructed, Disp: 100 each, Rfl: 0 .  oxyCODONE-acetaminophen (PERCOCET/ROXICET) 5-325 MG tablet, Take 1 tablet by mouth every 6 (six) hours as needed for severe pain., Disp: 8 tablet, Rfl:  0 Allergies  Allergen Reactions  . Morphine And Related Nausea And Vomiting and Other (See Comments)    Severe nausea     Social History   Social History  . Marital status: Single    Spouse name: N/A  . Number of children: N/A  . Years of education: N/A   Occupational History  . Not on file.   Social History Main Topics  . Smoking status: Former Smoker    Packs/day: 1.00    Years: 40.00    Types: Cigarettes    Start date: 06/23/1972    Quit date: 03/26/2012  . Smokeless tobacco: Never Used  . Alcohol use No  . Drug use: No  . Sexual activity: Yes   Other Topics Concern  . Not on file   Social History Narrative  . No narrative on file    Physical Exam  Pulmonary/Chest: No respiratory distress. She has no wheezes. She has no rales.  Abdominal: She exhibits no distension. There is no rebound and no guarding.  Musculoskeletal: She exhibits no edema.  Skin: Skin is warm and dry. She is not diaphoretic.        Future Appointments Date Time Provider [place]  08/20/2016 8:00 PM [facility] 4 [facility]    ATF pt CAO x4 with no complaints. Pt stated that she ate fried fish, mac n chesse from [employer] last night for dinner; and she was eating a piece of fried chicken for breakfast upon my arrival.  We talked aout her choices again during this visit.  She went to see her primary physician yesterday but he didn't send in prescription for metformin yet.  Pt denies sob, headache, dizziness and chest pain. rx bottles verified and pill box refilled.  Shes out of bidil after Friday but the pharmacy will have her rx ready within a hour.    BP 112/70 (BP Location: Right Arm, Patient Position: Sitting, Cuff Size: Normal)   Pulse 88   Resp 16   Wt 229 lb 9.6 oz (104.1 kg)   SpO2 98%   BMI 35.96 kg/m  cbg 260  Weight yesterday-remember Last visit weight-226    Marguetta Windish, EMT Paramedic 08/07/2016    ACTION: Home visit completed

## 2016-08-20 ENCOUNTER — Ambulatory Visit (HOSPITAL_BASED_OUTPATIENT_CLINIC_OR_DEPARTMENT_OTHER): Payer: Medicare Other | Attending: Cardiology

## 2016-08-22 ENCOUNTER — Other Ambulatory Visit (HOSPITAL_COMMUNITY): Payer: Self-pay

## 2016-08-22 NOTE — Progress Notes (Signed)
Paramedicine Encounter    Patient ID: [name], female    DOB: 05/17/1950, 66 y.o.   MRN: [id number]    Patient Care Team: [name], MD as PCP - General (Family Medicine)  Patient Active Problem List   Diagnosis Date Noted  . Paroxysmal atrial fibrillation (HCC)   . Biventricular automatic implantable cardioverter defibrillator in situ   . Sepsis ([place]) 04/20/2016  . UTI (urinary tract infection) 04/20/2016  . Dyspnea 07/19/2015  . Interstitial lung disease ([place]) 11/20/2014  . SIRS (systemic inflammatory response syndrome) ([place]) 02/03/2014  . DM (diabetes mellitus) ([place]) 02/03/2014  . Tachycardia 06/14/2013  . Chronic combined systolic and diastolic CHF (congestive heart failure) ([place]) 09/29/2012  . Hypoxia 03/06/2012  . Hypertensive heart disease 03/06/2012  . Nonischemic cardiomyopathy ([place]) 03/06/2012  . Tobacco abuse 03/06/2012  . Type 2 diabetes mellitus ([place]) 03/06/2012  . Hypertension 03/06/2012  . CAD (coronary artery disease), native coronary artery 03/06/2012  . Acute on chronic combined systolic and diastolic heart failure ([place]) 03/06/2012  . Hypokalemia 03/06/2012  . Acute bronchitis 02/01/2009  . Sleep apnea 02/01/2009  . Chest pain 02/01/2009    Current Outpatient Prescriptions:  .  apixaban (ELIQUIS) 5 MG TABS tablet, Take 1 tablet (5 mg total) by mouth 2 (two) times daily., Disp: 60 tablet, Rfl: 3 .  atorvastatin (LIPITOR) 40 MG tablet, Take 1 tablet (40 mg total) by mouth daily., Disp: 30 tablet, Rfl: 3 .  carvedilol (COREG) 12.5 MG tablet, Take 1 tablet (12.5 mg total) by mouth 2 (two) times daily with a meal., Disp: 60 tablet, Rfl: 3 .  furosemide (LASIX) 40 MG tablet, Take 1 tablet (40 mg total) by mouth 2 (two) times daily., Disp: 60 tablet, Rfl: 3 .  insulin aspart (NOVOLOG FLEXPEN) 100 UNIT/ML FlexPen, 0-20 Units, Subcutaneous, 3 times daily with meals CBG < 70: implement hypoglycemia protocol CBG 70 - 120: 0 units CBG 121 - 150: 3 units CBG  151 - 200: 4 units CBG 201 - 250: 7 units CBG 251 - 300: 11 units CBG 301 - 350: 15 units CBG 351 - 400: 20 units CBG > 400: call MD, Disp: 15 mL, Rfl: 0 .  Insulin Glargine (LANTUS SOLOSTAR) 100 UNIT/ML Solostar Pen, Inject 55 Units into the skin daily., Disp: 15 mL, Rfl: 0 .  isosorbide-hydrALAZINE (BIDIL) 20-37.5 MG tablet, Take 1 tablet by mouth 3 (three) times daily., Disp: 90 tablet, Rfl: 3 .  metFORMIN (GLUCOPHAGE) 1000 MG tablet, TAKE 1 TABLET(1000 MG) BY MOUTH TWICE DAILY WITH A MEAL, Disp: 60 tablet, Rfl: 0 .  sacubitril-valsartan (ENTRESTO) 97-103 MG, Take 1 tablet by mouth 2 (two) times daily., Disp: 60 tablet, Rfl: 6 .  spironolactone (ALDACTONE) 25 MG tablet, Take 1 tablet (25 mg total) by mouth daily., Disp: 30 tablet, Rfl: 3 .  acetaminophen (TYLENOL) 500 MG tablet, Take 1 tablet (500 mg total) by mouth every 6 (six) hours as needed. (Patient taking differently: Take 500 mg by mouth every 6 (six) hours as needed for moderate pain. ), Disp: 30 tablet, Rfl: 0 .  albuterol (PROVENTIL HFA;VENTOLIN HFA) 108 (90 Base) MCG/ACT inhaler, Inhale 2 puffs into the lungs every 6 (six) hours as needed for wheezing or shortness of breath., Disp: 18 g, Rfl: 0 .  glucose blood (FREESTYLE TEST STRIPS) test strip, Use as instructed, Disp: 100 each, Rfl: 0 .  glucose monitoring kit (FREESTYLE) monitoring kit, 1 each by Does not apply route 4 (four) times daily - after meals and  at bedtime. 1 month Diabetic Testing Supplies for QAC-QHS accuchecks., Disp: 1 each, Rfl: 1 .  ibuprofen (ADVIL,MOTRIN) 600 MG tablet, Take 1 tablet (600 mg total) by mouth every 6 (six) hours as needed., Disp: 30 tablet, Rfl: 0 .  Insulin Pen Needle 32G X 8 MM MISC, Use as directed, Disp: 100 each, Rfl: 0 .  Lancets (FREESTYLE) lancets, Use as instructed, Disp: 100 each, Rfl: 0 .  oxyCODONE-acetaminophen (PERCOCET/ROXICET) 5-325 MG tablet, Take 1 tablet by mouth every 6 (six) hours as needed for severe pain., Disp: 8 tablet, Rfl:  0 Allergies  Allergen Reactions  . Morphine And Related Nausea And Vomiting and Other (See Comments)    Severe nausea     Social History   Social History  . Marital status: Single    Spouse name: N/A  . Number of children: N/A  . Years of education: N/A   Occupational History  . Not on file.   Social History Main Topics  . Smoking status: Former Smoker    Packs/day: 1.00    Years: 40.00    Types: Cigarettes    Start date: 06/23/1972    Quit date: 03/26/2012  . Smokeless tobacco: Never Used  . Alcohol use No  . Drug use: No  . Sexual activity: Yes   Other Topics Concern  . Not on file   Social History Narrative  . No narrative on file    Physical Exam  Pulmonary/Chest: No respiratory distress. She has no wheezes. She has no rales.  Abdominal: She exhibits no distension. There is no tenderness. There is no guarding.  Musculoskeletal: She exhibits no edema.  Skin: Skin is warm and dry. She is not diaphoretic.        No future appointments.  ATF pt CAO x4 sitting on her bed working on refilling her pill box.  Pt has taken all of her medications without missing any doses.  She stated that she is trying to eat low sodium foods but its had because her food stamps hasn't "came yet".  Pt was fussing about her primary physician during our visit and she has requested to change to [address].  Pt denies sob, dizziness, headaches and chest pain.  She is still out of metformin although [facility] pharmacist has sent 3 refill requests over.  I called Dr. Alyson Ingles office again today and the assistant stated that she has up until 4pm to respond to the request and to call her back then.  Pt has one pill left in the pill box for tomorrow.  rx bottles verified and pill box refilled/verified.    BP (!) 162/98 (BP Location: Right Arm, Patient Position: Sitting, Cuff Size: Normal)   Pulse 97   Resp 16   Wt 231 lb 12.8 oz (105.1 kg)   SpO2 99%   BMI 36.31 kg/m   cbg 296 (pt has not  taken her insulin for today/only her morning meds were taken prior to my arrival)  Weight yesterday-pt's scale was broken/gave her a new one today Last visit weight-229    Azam Gervasi, EMT Paramedic 08/22/2016    ACTION: Home visit completed Next visit planned for next friday

## 2016-08-29 ENCOUNTER — Other Ambulatory Visit (HOSPITAL_COMMUNITY): Payer: Self-pay

## 2016-08-29 NOTE — Progress Notes (Signed)
Paramedicine Encounter    Patient ID: [name], female    DOB: 07-20-1950, 66 y.o.   MRN: [id number]    Patient Care Team: [name], MD as PCP - General (Family Medicine)  Patient Active Problem List   Diagnosis Date Noted  . Paroxysmal atrial fibrillation (HCC)   . Biventricular automatic implantable cardioverter defibrillator in situ   . Sepsis ([place]) 04/20/2016  . UTI (urinary tract infection) 04/20/2016  . Dyspnea 07/19/2015  . Interstitial lung disease ([place]) 11/20/2014  . SIRS (systemic inflammatory response syndrome) ([place]) 02/03/2014  . DM (diabetes mellitus) ([place]) 02/03/2014  . Tachycardia 06/14/2013  . Chronic combined systolic and diastolic CHF (congestive heart failure) ([name]) 09/29/2012  . Hypoxia 03/06/2012  . Hypertensive heart disease 03/06/2012  . Nonischemic cardiomyopathy ([place]) 03/06/2012  . Tobacco abuse 03/06/2012  . Type 2 diabetes mellitus ([place]) 03/06/2012  . Hypertension 03/06/2012  . CAD (coronary artery disease), native coronary artery 03/06/2012  . Acute on chronic combined systolic and diastolic heart failure ([place]) 03/06/2012  . Hypokalemia 03/06/2012  . Acute bronchitis 02/01/2009  . Sleep apnea 02/01/2009  . Chest pain 02/01/2009    Current Outpatient Prescriptions:  .  apixaban (ELIQUIS) 5 MG TABS tablet, Take 1 tablet (5 mg total) by mouth 2 (two) times daily., Disp: 60 tablet, Rfl: 3 .  atorvastatin (LIPITOR) 40 MG tablet, Take 1 tablet (40 mg total) by mouth daily., Disp: 30 tablet, Rfl: 3 .  carvedilol (COREG) 12.5 MG tablet, Take 1 tablet (12.5 mg total) by mouth 2 (two) times daily with a meal., Disp: 60 tablet, Rfl: 3 .  furosemide (LASIX) 40 MG tablet, Take 1 tablet (40 mg total) by mouth 2 (two) times daily., Disp: 60 tablet, Rfl: 3 .  glucose blood (FREESTYLE TEST STRIPS) test strip, Use as instructed, Disp: 100 each, Rfl: 0 .  glucose monitoring kit (FREESTYLE) monitoring kit, 1 each by Does not apply route 4 (four) times  daily - after meals and at bedtime. 1 month Diabetic Testing Supplies for QAC-QHS accuchecks., Disp: 1 each, Rfl: 1 .  Insulin Glargine (LANTUS SOLOSTAR) 100 UNIT/ML Solostar Pen, Inject 55 Units into the skin daily., Disp: 15 mL, Rfl: 0 .  Insulin Pen Needle 32G X 8 MM MISC, Use as directed, Disp: 100 each, Rfl: 0 .  isosorbide-hydrALAZINE (BIDIL) 20-37.5 MG tablet, Take 1 tablet by mouth 3 (three) times daily., Disp: 90 tablet, Rfl: 3 .  sacubitril-valsartan (ENTRESTO) 97-103 MG, Take 1 tablet by mouth 2 (two) times daily., Disp: 60 tablet, Rfl: 6 .  spironolactone (ALDACTONE) 25 MG tablet, Take 1 tablet (25 mg total) by mouth daily., Disp: 30 tablet, Rfl: 3 .  acetaminophen (TYLENOL) 500 MG tablet, Take 1 tablet (500 mg total) by mouth every 6 (six) hours as needed. (Patient taking differently: Take 500 mg by mouth every 6 (six) hours as needed for moderate pain. ), Disp: 30 tablet, Rfl: 0 .  albuterol (PROVENTIL HFA;VENTOLIN HFA) 108 (90 Base) MCG/ACT inhaler, Inhale 2 puffs into the lungs every 6 (six) hours as needed for wheezing or shortness of breath., Disp: 18 g, Rfl: 0 .  ibuprofen (ADVIL,MOTRIN) 600 MG tablet, Take 1 tablet (600 mg total) by mouth every 6 (six) hours as needed., Disp: 30 tablet, Rfl: 0 .  insulin aspart (NOVOLOG FLEXPEN) 100 UNIT/ML FlexPen, 0-20 Units, Subcutaneous, 3 times daily with meals CBG < 70: implement hypoglycemia protocol CBG 70 - 120: 0 units CBG 121 - 150: 3 units CBG 151 - 200:  4 units CBG 201 - 250: 7 units CBG 251 - 300: 11 units CBG 301 - 350: 15 units CBG 351 - 400: 20 units CBG > 400: call MD, Disp: 15 mL, Rfl: 0 .  Lancets (FREESTYLE) lancets, Use as instructed, Disp: 100 each, Rfl: 0 .  metFORMIN (GLUCOPHAGE) 1000 MG tablet, TAKE 1 TABLET(1000 MG) BY MOUTH TWICE DAILY WITH A MEAL, Disp: 60 tablet, Rfl: 0 .  oxyCODONE-acetaminophen (PERCOCET/ROXICET) 5-325 MG tablet, Take 1 tablet by mouth every 6 (six) hours as needed for severe pain., Disp: 8 tablet, Rfl:  0 Allergies  Allergen Reactions  . Morphine And Related Nausea And Vomiting and Other (See Comments)    Severe nausea     Social History   Social History  . Marital status: Single    Spouse name: N/A  . Number of children: N/A  . Years of education: N/A   Occupational History  . Not on file.   Social History Main Topics  . Smoking status: Former Smoker    Packs/day: 1.00    Years: 40.00    Types: Cigarettes    Start date: 06/23/1972    Quit date: 03/26/2012  . Smokeless tobacco: Never Used  . Alcohol use No  . Drug use: No  . Sexual activity: Yes   Other Topics Concern  . Not on file   Social History Narrative  . No narrative on file    Physical Exam  Pulmonary/Chest: No respiratory distress. She has no wheezes. She has no rales.  Abdominal: She exhibits no distension. There is no tenderness. There is no guarding.  Musculoskeletal: She exhibits no edema.  Skin: Skin is warm and dry. She is not diaphoretic.        No future appointments.  ATF pt CAO x4 sitting in her car arguing with her sister.  Pt appeared to be agitated.  She stated that she "just took her morning medications when I pulled up".  Pt has been out all day and did not take her medications with her.  She stated that she hasn't missed any other dosages this week.  She went to the [employer] this am for breakfast and had steak, eggs, grits and toast.  Its unknown what she ate for lunch.  Pt's CBG and bp is elevated.  She also missed her insulin dose.  We talked about her missing medications and the importance of her remaining compliant.  rx bottles verified and her pill box filled.  She is still out of metformin, I spoke to both the pharmacist and the nurse from [place] office last week.  Pt stated that the prescription is still not ready.  I will f/u with pt on mon or tues next week to recheck her bp. Pt's vitals maybe elevated due to her not taking her medications.  BP (!) 180/98 (BP Location:  Right Arm, Patient Position: Sitting)   Pulse 93   Resp 16   Wt 233 lb 0.3 oz (105.7 kg)   SpO2 98%   BMI 36.50 kg/m  cbg 375 Weight yesterday-didn't weigh Last visit weight-231    Derl Abalos, EMT Paramedic 08/29/2016    ACTION: Home visit completed

## 2016-09-02 ENCOUNTER — Emergency Department (HOSPITAL_COMMUNITY): Payer: Medicare Other

## 2016-09-02 ENCOUNTER — Emergency Department (HOSPITAL_COMMUNITY)
Admission: EM | Admit: 2016-09-02 | Discharge: 2016-09-02 | Disposition: A | Discharge status: Home / Self Care | Payer: Medicare Other | Attending: Emergency Medicine | Admitting: Emergency Medicine

## 2016-09-02 DIAGNOSIS — Z950 Presence of cardiac pacemaker: Secondary | ICD-10-CM | POA: Diagnosis not present

## 2016-09-02 DIAGNOSIS — I428 Other cardiomyopathies: Secondary | ICD-10-CM | POA: Diagnosis not present

## 2016-09-02 DIAGNOSIS — Z794 Long term (current) use of insulin: Secondary | ICD-10-CM

## 2016-09-02 DIAGNOSIS — I5042 Chronic combined systolic (congestive) and diastolic (congestive) heart failure: Secondary | ICD-10-CM | POA: Diagnosis not present

## 2016-09-02 DIAGNOSIS — Z87891 Personal history of nicotine dependence: Secondary | ICD-10-CM | POA: Insufficient documentation

## 2016-09-02 DIAGNOSIS — R079 Chest pain, unspecified: Secondary | ICD-10-CM

## 2016-09-02 DIAGNOSIS — I110 Hypertensive heart disease with heart failure: Secondary | ICD-10-CM | POA: Diagnosis not present

## 2016-09-02 DIAGNOSIS — I10 Essential (primary) hypertension: Secondary | ICD-10-CM

## 2016-09-02 DIAGNOSIS — I2510 Atherosclerotic heart disease of native coronary artery without angina pectoris: Secondary | ICD-10-CM | POA: Insufficient documentation

## 2016-09-02 DIAGNOSIS — E119 Type 2 diabetes mellitus without complications: Secondary | ICD-10-CM | POA: Insufficient documentation

## 2016-09-02 DIAGNOSIS — I5022 Chronic systolic (congestive) heart failure: Secondary | ICD-10-CM | POA: Diagnosis not present

## 2016-09-02 DIAGNOSIS — I480 Paroxysmal atrial fibrillation: Secondary | ICD-10-CM

## 2016-09-02 DIAGNOSIS — R42 Dizziness and giddiness: Secondary | ICD-10-CM | POA: Diagnosis present

## 2016-09-02 LAB — COMPREHENSIVE METABOLIC PANEL
ALT: 11 U/L — ABNORMAL LOW (ref 14–54)
AST: 16 U/L (ref 15–41)
Albumin: 3.7 g/dL (ref 3.5–5.0)
Alkaline Phosphatase: 79 U/L (ref 38–126)
Anion gap: 11 (ref 5–15)
BUN: 19 mg/dL (ref 6–20)
CO2: 27 mmol/L (ref 22–32)
Calcium: 9.3 mg/dL (ref 8.9–10.3)
Chloride: 99 mmol/L — ABNORMAL LOW (ref 101–111)
Creatinine, Ser: 1.27 mg/dL — ABNORMAL HIGH (ref 0.44–1.00)
GFR calc Af Amer: 51 mL/min — ABNORMAL LOW (ref >60)
GFR calc non Af Amer: 44 mL/min — ABNORMAL LOW (ref >60)
Glucose, Bld: 449 mg/dL — ABNORMAL HIGH (ref 65–99)
Potassium: 3.1 mmol/L — ABNORMAL LOW (ref 3.5–5.1)
Sodium: 137 mmol/L (ref 135–145)
Total Bilirubin: 0.8 mg/dL (ref 0.3–1.2)
Total Protein: 6.8 g/dL (ref 6.5–8.1)

## 2016-09-02 LAB — CBC WITH DIFFERENTIAL/PLATELET
Basophils Absolute: 0 10*3/uL (ref 0.0–0.1)
Basophils Relative: 0 %
Eosinophils Absolute: 0.1 10*3/uL (ref 0.0–0.7)
Eosinophils Relative: 1 %
HCT: 34.1 % — ABNORMAL LOW (ref 36.0–46.0)
Hemoglobin: 12.1 g/dL (ref 12.0–15.0)
Lymphocytes Relative: 27 %
Lymphs Abs: 2.6 10*3/uL (ref 0.7–4.0)
MCH: 30.8 pg (ref 26.0–34.0)
MCHC: 35.5 g/dL (ref 30.0–36.0)
MCV: 86.8 fL (ref 78.0–100.0)
Monocytes Absolute: 0.6 10*3/uL (ref 0.1–1.0)
Monocytes Relative: 6 %
Neutro Abs: 6.3 10*3/uL (ref 1.7–7.7)
Neutrophils Relative %: 66 %
Platelets: 374 10*3/uL (ref 150–400)
RBC: 3.93 MIL/uL (ref 3.87–5.11)
RDW: 12.4 % (ref 11.5–15.5)
WBC: 9.7 10*3/uL (ref 4.0–10.5)

## 2016-09-02 LAB — I-STAT TROPONIN, ED
Troponin i, poc: 0.02 ng/mL (ref 0.00–0.08)
Troponin i, poc: 0.01 ng/mL (ref 0.00–0.08)

## 2016-09-02 LAB — CBG MONITORING, ED: Glucose-Capillary: 424 mg/dL — ABNORMAL HIGH (ref 65–99)

## 2016-09-02 MED ORDER — POTASSIUM CHLORIDE CRYS ER 20 MEQ PO TBCR: 20.0000 meq | EXTENDED_RELEASE_TABLET | Freq: Once | ORAL | Status: DC

## 2016-09-02 NOTE — ED Notes (Signed)
Pt verbalized understanding discharge instructions and denies any further needs or questions at this time. VS stable, ambulatory and steady gait.   

## 2016-09-02 NOTE — Consult Note (Signed)
Cardiology Consultation:   Patient ID: [name]; [id number]; [birth year]   Admit date: 09/02/2016 Date of Consult: 09/02/2016  Primary Care Provider: [name], MD Primary Cardiologist: Dr. Aundra Dubin Primary Electrophysiologist:  [name]   Patient Profile:   [name] is a 66 y.o. female with a hx of of NICM with LBBB s/p Bi-V defibrillator (MDT CRT-D 10/2012), HTN, HLD, DM, COPD on home O2 (for exertion), PAF on eliquis (2016), and medical noncompliance who is being seen today for the evaluation of atrial fibrillation and chest pain at the request of Dr. Laverta Baltimore.   History of Present Illness:   [name] is known to this service and sees Dr. Aundra Dubin in AHF clinic. She was last seen on 07/15/16 and was in her usual state of health at that time. She was recently hospitalized March 2018 with urosepsis with a lower EF of 30-35%. She was also found to be in Afib and was started on amiodarone and eliquis. At this clinic visit, she was up in weight by 5 lbs and in NSR. LHC 02/2012 with nonobstructive disease. Nonischemic cardiomyopathy thought to be due to HTN vs LBBB.   This morning, she took her morning pills and shortly after experienced dizziness, palpitations, and chest discomfort. She was brought to ED via EMS. Troponin x 1 was negative, EKG unchanged from prior. ICD interrogation showed 3 hr and 25 min of Afib that converted back to sinus. Her chest pain resolved and she is now chest pain free. She has been compliant on all medications. She has not experienced this chest pain before, which was located in her left chest without radiation. She has experienced palpitations with accompanying dizziness, but without chest pain.   Pt states she is ready for discharge.   Past Medical History:  Diagnosis Date  . Anemia    a. Noted on 07/2012 labs, instructed to f/u PCP.  Marland Kitchen Arthritis    "joints" (11/18/2012)  . Automatic implantable cardioverter-defibrillator in situ   . CAD (coronary  artery disease), native coronary artery    a. Nonobstructive by cath 02/2012 (done because of low EF).  . Chronic bronchitis ([place])    "~ every other year" (11/18/2012)  . Chronic combined systolic and diastolic CHF (congestive heart failure) ([place])    a. 03/05/12 echo:  LVEF 20-25%, moderate LVH , inferior and basal to mid septal akinesis, anterior moderate to severe hypokinesis and grade 2 diastolic dysfunction. b. EF 07/2012: EF still 25% (unclear medication compliance).  . Chronic lower back pain   . Headache(784.0)    "often; maybe not daily" (11/18/2012)  . High cholesterol   . History of noncompliance with medical treatment   . Hypertension   . LBBB (left bundle branch block)   . Orthopnea   . Tobacco abuse   . Type II diabetes mellitus ([place])     Past Surgical History:  Procedure Laterality Date  . BI-VENTRICULAR IMPLANTABLE CARDIOVERTER DEFIBRILLATOR N/A 11/18/2012   Procedure: BI-VENTRICULAR IMPLANTABLE CARDIOVERTER DEFIBRILLATOR  (CRT-D);  Surgeon: Evans Lance, MD;  Location[facility] CATH LAB;  Service: Cardiovascular;  Laterality: N/A;  . BI-VENTRICULAR IMPLANTABLE CARDIOVERTER DEFIBRILLATOR  (CRT-D)  11/18/2012  . CARDIAC CATHETERIZATION  03/04/12   nonobstructive CAD, elevated LVEDP and tortuous vessels suggestive of long-standing hypertension  . JOINT REPLACEMENT     Bilateral hip and right knee  . LEFT HEART CATH N/A 03/05/2012   Procedure: LEFT HEART CATH;  Surgeon: Larey Dresser, MD;  Location[facility] CATH LAB;  Service: Cardiovascular;  Laterality: N/A;     Inpatient Medications: Scheduled Meds:  Continuous Infusions:  PRN Meds:   Allergies:    Allergies  Allergen Reactions  . Morphine And Related Nausea And Vomiting and Other (See Comments)    Severe nausea    Social History:   Social History   Social History  . Marital status: Single    Spouse name: N/A  . Number of children: N/A  . Years of education: N/A   Occupational History  . Not on file.   Social  History Main Topics  . Smoking status: Former Smoker    Packs/day: 1.00    Years: 40.00    Types: Cigarettes    Start date: 06/23/1972    Quit date: 03/26/2012  . Smokeless tobacco: Never Used  . Alcohol use No  . Drug use: No  . Sexual activity: Yes   Other Topics Concern  . Not on file   Social History Narrative  . No narrative on file    Family History:   Family History  Problem Relation Age of Onset  . Heart disease Neg Hx      ROS:  Please see the history of present illness.  ROS  All other ROS reviewed and negative.     Physical Exam/Data:   Vitals:   09/02/16 1224 09/02/16 1228 09/02/16 1229 09/02/16 1230  BP:    103/70  Pulse:    (!) 101  Resp:    (!) 25  Temp:    98.1 F (36.7 C)  TempSrc:    Oral  SpO2: 95% 96%  97%  Weight:   230 lb (104.3 kg)   Height:   5\' 7"  (1.702 m)    No intake or output data in the 24 hours ending 09/02/16 1519 Filed Weights   09/02/16 1229  Weight: 230 lb (104.3 kg)   Body mass index is 36.02 kg/m.  General:  Well nourished, well developed, in no acute distress HEENT: normal Lymph: no adenopathy Neck: no JVD Endocrine:  No thryomegaly Vascular: No carotid bruits; FA pulses 2+ bilaterally without bruits  Cardiac:  normal S1, S2; RRR; no murmur, ICD pocket intact Lungs:  clear to auscultation bilaterally, no wheezing, rhonchi or rales  Abd: soft, nontender, no hepatomegaly  Ext: no edema Musculoskeletal:  No deformities, BUE and BLE strength normal and equal Skin: warm and dry  Neuro:  CNs 2-12 intact, no focal abnormalities noted Psych:  Normal affect   EKG:  The EKG was personally reviewed and demonstrates:  V-paced Telemetry:  Telemetry was personally reviewed and demonstrates: paced rhythm vs NSR  Relevant CV Studies:  Echocardiogram 07/15/16: Study Conclusions - Left ventricle: The cavity size was normal. Wall thickness was   increased in a pattern of moderate LVH. Systolic function was   mildly reduced. The  estimated ejection fraction was in the range   of 45% to 50%. Diffuse hypokinesis. Doppler parameters are   consistent with abnormal left ventricular relaxation (grade 1   diastolic dysfunction). - Aortic valve: There was no stenosis. - Mitral valve: Mildly calcified annulus. There was no significant   regurgitation. - Right ventricle: The cavity size was normal. Pacer wire or   catheter noted in right ventricle. Systolic function was mildly   reduced. - Tricuspid valve: Peak RV-RA gradient (S): 21 mm Hg. - Pulmonary arteries: PA peak pressure: 24 mm Hg (S). - Inferior vena cava: The vessel was normal in size. The   respirophasic diameter changes  were in the normal range (>= 50%),   consistent with normal central venous pressure.  Impressions: - Normal LV size with moderate LV hypertrophy. EF 45-50%, mild   diffuse hypokinesis. Normal RV size with mildly decreased   systolic function. No significant valvular abnormalities.   Left heart cath 03/05/12: Nonischemic cardiomyopathy.  Elevated LVEDP.  Tortuous vessels suggestive of long-standing hypertension.  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  Needs ongoing diuresis.  Will also add spironolactone 12.5 mg daily.  Titrate up cardiac medications over the next couple of days.  Will also check HIV and serum/urine immunofixation given nonischemic CMP    Laboratory Data:  Chemistry Recent Labs Lab 09/02/16 1231  NA 137  K 3.1*  CL 99*  CO2 27  GLUCOSE 449*  BUN 19  CREATININE 1.27*  CALCIUM 9.3  GFRNONAA 44*  GFRAA 51*  ANIONGAP 11     Recent Labs Lab 09/02/16 1231  PROT 6.8  ALBUMIN 3.7  AST 16  ALT 11*  ALKPHOS 79  BILITOT 0.8   Hematology Recent Labs Lab 09/02/16 1231  WBC 9.7  RBC 3.93  HGB 12.1  HCT 34.1*  MCV 86.8  MCH 30.8  MCHC 35.5  RDW 12.4  PLT 374   Cardiac EnzymesNo results for input(s): TROPONINI in the last 168 hours.  Recent Labs Lab 09/02/16 1307  TROPIPOC 0.02    BNPNo results for  input(s): BNP, PROBNP in the last 168 hours.  DDimer No results for input(s): DDIMER in the last 168 hours.  Radiology/Studies:  Dg Chest 2 View  Result Date: 09/02/2016 CLINICAL DATA:  Shortness of breath and chest pain. Possible allergic reaction. EXAM: CHEST  2 VIEW COMPARISON:  Two-view chest x-ray 04/20/2016. FINDINGS: The heart is mildly enlarged. Pacing and defibrillator wires are stable. There is no edema or effusion. No focal airspace disease is present. The visualized soft tissues and bony thorax are unremarkable. IMPRESSION: 1. Borderline cardiomegaly without failure. 2. No acute cardiopulmonary disease. Electronically Signed   By: San Morelle M.D.   On: 09/02/2016 14:38    Assessment and Plan:   1. Chest pain - troponin x 1 negative - EKG with paced rhythm, unchanged - cath in 2014 with no obstructive disease - this chest pain may be related to her bout of Afib - will cycle a delta troponin. If negative, will schedule for follow up OP.    2. Paroxysmal Afib  - home eliquis - she was taken off amiodarone on 06/18/16 - interrogation report shows one episode of Afib on 09/02/16 of 3 hrs and 25 min - she is now sinus rhythm on telemetry with some bouts of A-sensed, V-paced rhythm - will review interrogation and telemetry with attending - given her restrictive PFTs suggestive of possible interstitial fibrosis, she is not a good candidate to restart amiodarone   3. Chronic systolic heart failurem NICM - continue coreg, bidil, entresto, and sprionolactone   4. HTN - continue home meds as above   5. HLD - continue statin   Signed, Ledora Bottcher, [place]  09/02/2016 3:19 PM Patient seen and examined and history reviewed. Agree with above findings and plan. 66 yo BF with above complex history. History of nonischemic cardiomyopathy, Pafib, IDDM, noncompliance, HTN, and HLD. Presents today with complaints of palpitations, chest pain and some dyspnea. EMS Ecg showed AFib  with rate 108. With conversion to NSR her symptoms promptly resolved.  On exam she has no JVD or bruits. Lungs are clear. CV RRR without gallop, murmur, or  rub. No edema  Ecgs and ICD interrogation reviewed. Initially in AFib with rate 108. Follow up ECG NSR. LBBB. ICD confirms episode of Afib lasting 3 hours and 25 minutes.   Troponin is negative\  Impression:  1. Paroxysmal AFib clearly documented by Ecg and ICD interrogation. With Afib rate controlled. She is on anticoagulation. At this point there is nothing we need to add to her current regimen. If she has more persistent or frequent Afib may need to reconsider AAD therapy since she is symptomatic.  2. Chronic systolic CHF. She does not appear to be volume overloaded 3. IDDM patient asked me to refill her prescriptions for her insulin and metformin since she hasn't been able to get in touch with her primary care. In checking with her pharmacy she has not had insulin filled since March. Will refill Rx now but she will need prompt follow up with her primary care. BS 449 today. Stressed importance of compliance.  She is stable for discharge from the ED today.   Denisa Enterline [place], [place] 09/02/2016 5:11 PM

## 2016-09-02 NOTE — ED Triage Notes (Signed)
Per EMS: Pt called out for suspected allergic reaction today. Pt states she took her home meds and started to feel dizzy. Pt has no hives, wheezing or edema. Pt c/o of SOB and Chest wall pain; pt had pain on palpation.  Pt was orthostatic pos on scene. Pt in paced rhythm.   130/66 laying 86/50 standing 109/60 after 200NS CBG pf 504  Marin Olp

## 2016-09-02 NOTE — ED Notes (Signed)
Pt using bedside toilet.

## 2016-09-02 NOTE — ED Notes (Signed)
Interrogated pt pace maker.  [employer].

## 2016-09-02 NOTE — ED Provider Notes (Signed)
Emergency Department Provider Note   I have reviewed the triage vital signs and the nursing notes.   HISTORY  Chief Complaint Dizziness   HPI [name] is a 66 y.o. female with PMH of CAD, CHF, HLD, and HTN resents to the emergency room in for evaluation of chest pain. Symptoms began at 8:30 this morning after taking her medications. She had some associated shortness of breath. Or, chills, productive cough. Has had similar chest pain in the past. She states that she laid down to try and take a nap but her symptoms did not improve and she called EMS. She describes a central chest pressure with no radiation. Chest pain has since resolved with no intervention.   Past Medical History:  Diagnosis Date  . Anemia    a. Noted on 07/2012 labs, instructed to f/u PCP.  Marland Kitchen Arthritis    "joints" (11/18/2012)  . Automatic implantable cardioverter-defibrillator in situ   . CAD (coronary artery disease), native coronary artery    a. Nonobstructive by cath 02/2012 (done because of low EF).  . Chronic bronchitis ([place])    "~ every other year" (11/18/2012)  . Chronic combined systolic and diastolic CHF (congestive heart failure) ([place])    a. 03/05/12 echo:  LVEF 20-25%, moderate LVH , inferior and basal to mid septal akinesis, anterior moderate to severe hypokinesis and grade 2 diastolic dysfunction. b. EF 07/2012: EF still 25% (unclear medication compliance).  . Chronic lower back pain   . Headache(784.0)    "often; maybe not daily" (11/18/2012)  . High cholesterol   . History of noncompliance with medical treatment   . Hypertension   . LBBB (left bundle branch block)   . Orthopnea   . Tobacco abuse   . Type II diabetes mellitus [facility])     Patient Active Problem List   Diagnosis Date Noted  . Paroxysmal A-fib ([place])   . Biventricular automatic implantable cardioverter defibrillator in situ   . Sepsis ([place]) 04/20/2016  . UTI (urinary tract infection) 04/20/2016  . Dyspnea 07/19/2015  .  Interstitial lung disease ([place]) 11/20/2014  . SIRS (systemic inflammatory response syndrome) ([place]) 02/03/2014  . IDDM (insulin dependent diabetes mellitus) ([place]) 02/03/2014  . Tachycardia 06/14/2013  . Chronic systolic CHF (congestive heart failure) ([place]) 09/29/2012  . Hypoxia 03/06/2012  . Hypertensive heart disease 03/06/2012  . Nonischemic cardiomyopathy ([place]) 03/06/2012  . Tobacco abuse 03/06/2012  . Type 2 diabetes mellitus ([place]) 03/06/2012  . Hypertension 03/06/2012  . CAD (coronary artery disease), native coronary artery 03/06/2012  . Acute on chronic combined systolic and diastolic heart failure ([place]) 03/06/2012  . Hypokalemia 03/06/2012  . Acute bronchitis 02/01/2009  . Sleep apnea 02/01/2009  . Chest pain 02/01/2009    Past Surgical History:  Procedure Laterality Date  . BI-VENTRICULAR IMPLANTABLE CARDIOVERTER DEFIBRILLATOR N/A 11/18/2012   Procedure: BI-VENTRICULAR IMPLANTABLE CARDIOVERTER DEFIBRILLATOR  (CRT-D);  Surgeon: Evans Lance, MD;  Location: [facility] CATH LAB;  Service: Cardiovascular;  Laterality: N/A;  . BI-VENTRICULAR IMPLANTABLE CARDIOVERTER DEFIBRILLATOR  (CRT-D)  11/18/2012  . CARDIAC CATHETERIZATION  03/04/12   nonobstructive CAD, elevated LVEDP and tortuous vessels suggestive of long-standing hypertension  . JOINT REPLACEMENT     Bilateral hip and right knee  . LEFT HEART CATH N/A 03/05/2012   Procedure: LEFT HEART CATH;  Surgeon: Larey Dresser, MD;  Location: [facility] CATH LAB;  Service: Cardiovascular;  Laterality: N/A;    Current Outpatient Rx  . Order #: [id number] Class: Print  . Order #: [id number] Class:  Normal  . Order #: [id number] Class: Normal  . Order #: [id number] Class: Normal  . Order #: [id number] Class: Normal  . Order #: [id number] Class: Print  . Order #: [id number] Class: Print  . Order #: [id number] Class: Print  . Order #: [id number] Class: Print  . Order #: [id number] Class: Normal  . Order #: [id number] Class: Normal  . Order #: [id number] Class: Normal    . Order #: [id number] Class: Normal  . Order #: [id number] Class: Print  . Order #: [id number] Class: Normal  . Order #: [id number] Class: Normal  . Order #: [id number] Class: Print  . Order #: [id number] Class: Print    Allergies Morphine and related  Family History  Problem Relation Age of Onset  . Heart disease Neg Hx     Social History Social History  Substance Use Topics  . Smoking status: Former Smoker    Packs/day: 1.00    Years: 40.00    Types: Cigarettes    Start date: 06/23/1972    Quit date: 03/26/2012  . Smokeless tobacco: Never Used  . Alcohol use No    Review of Systems  Constitutional: No fever/chills Eyes: No visual changes. ENT: No sore throat. Cardiovascular: Positive chest pain. Respiratory: Positive shortness of breath. Gastrointestinal: No abdominal pain.  No nausea, no vomiting.  No diarrhea.  No constipation. Genitourinary: Negative for dysuria. Musculoskeletal: Negative for back pain. Skin: Negative for rash. Neurological: Negative for headaches, focal weakness or numbness.  10-point ROS otherwise negative.  ____________________________________________   PHYSICAL EXAM:  VITAL SIGNS: ED Triage Vitals  Enc Vitals Group     BP 09/02/16 1230 103/70     Pulse Rate 09/02/16 1230 (!) 101     Resp 09/02/16 1230 (!) 25     Temp 09/02/16 1230 98.1 F (36.7 C)     Temp Source 09/02/16 1230 Oral     SpO2 09/02/16 1224 95 %     Weight 09/02/16 1229 230 lb (104.3 kg)     Height 09/02/16 1229 5\' 7"  (1.702 m)     Pain Score 09/02/16 1228 0    Constitutional: Alert and oriented. Well appearing and in no acute distress. Eyes: Conjunctivae are normal.  Head: Atraumatic. Nose: No congestion/rhinnorhea. Mouth/Throat: Mucous membranes are moist.  Neck: No stridor.  Cardiovascular: Normal rate, regular rhythm. Good peripheral circulation. Grossly normal heart sounds.   Respiratory: Normal respiratory effort.  No retractions. Lungs CTAB. Gastrointestinal:  Soft and nontender. No distention.  Musculoskeletal: No lower extremity tenderness nor edema. No gross deformities of extremities. Neurologic:  Normal speech and language. No gross focal neurologic deficits are appreciated.  Skin:  Skin is warm, dry and intact. No rash noted. Psychiatric: Mood and affect are normal. Speech and behavior are normal.  ____________________________________________   LABS (all labs ordered are listed, but only abnormal results are displayed)  Labs Reviewed  COMPREHENSIVE METABOLIC PANEL - Abnormal; Notable for the following:       Result Value   Potassium 3.1 (*)    Chloride 99 (*)    Glucose, Bld 449 (*)    Creatinine, Ser 1.27 (*)    ALT 11 (*)    GFR calc non Af Amer 44 (*)    GFR calc Af Amer 51 (*)    All other components within normal limits  CBC WITH DIFFERENTIAL/PLATELET - Abnormal; Notable for the following:    HCT 34.1 (*)    All other components within normal limits  CBG MONITORING, ED - Abnormal; Notable for the following:    Glucose-Capillary 424 (*)  All other components within normal limits  I-STAT TROPONIN, ED  I-STAT TROPONIN, ED   ____________________________________________  EKG   EKG Interpretation  Date/Time:  Tuesday September 02 2016 12:27:04 EDT Ventricular Rate:  103 PR Interval:    QRS Duration: 138 QT Interval:  414 QTC Calculation: 542 R Axis:   -107 Text Interpretation:  Atrial-sensed ventricular-paced rhythm No further analysis attempted due to paced rhythm Confirmed by Nanda Quinton [phone]) on 09/02/2016 1:24:16 PM       ____________________________________________  RADIOLOGY  Dg Chest 2 View  Result Date: 09/02/2016 CLINICAL DATA:  Shortness of breath and chest pain. Possible allergic reaction. EXAM: CHEST  2 VIEW COMPARISON:  Two-view chest x-ray 04/20/2016. FINDINGS: The heart is mildly enlarged. Pacing and defibrillator wires are stable. There is no edema or effusion. No focal airspace disease is present.  The visualized soft tissues and bony thorax are unremarkable. IMPRESSION: 1. Borderline cardiomegaly without failure. 2. No acute cardiopulmonary disease. Electronically Signed   By: San Morelle M.D.   On: 09/02/2016 14:38    ____________________________________________   PROCEDURES  Procedure(s) performed:   Procedures  None ____________________________________________   INITIAL IMPRESSION / ASSESSMENT AND PLAN / ED COURSE  Pertinent labs & imaging results that were available during my care of the patient were reviewed by me and considered in my medical decision making (see chart for details).  Patient presents to the emergency department for evaluation of chest pain and difficulty breathing. Symptoms began suddenly and have since resolved. Vital signs are normal. EKG similar to prior. For labs, CXR, and reassessment.   03:14 PM Spoke with Cardiology regarding case. Patient with a-fib RVR this AM that has resolved. Question any medication changes/pacemaker adjustments.   Cardiology recommendations pending. Anticipate discharge.  ____________________________________________  FINAL CLINICAL IMPRESSION(S) / ED DIAGNOSES  Final diagnoses:  Paroxysmal atrial fibrillation (HCC)  Nonspecific chest pain     MEDICATIONS GIVEN DURING THIS VISIT:  Medications  potassium chloride SA (K-DUR,KLOR-CON) CR tablet 20 mEq (20 mEq Oral Refused 09/02/16 1905)     NEW OUTPATIENT MEDICATIONS STARTED DURING THIS VISIT:  None   Note:  This document was prepared using Dragon voice recognition software and may include unintentional dictation errors.  Nanda Quinton, MD Emergency Medicine    Long, Wonda [facility], MD 09/02/16 [phone]

## 2016-09-02 NOTE — ED Provider Notes (Signed)
Pt visit shared.  Pt here for episode of chest discomfort, dizziness, resolved in ED.  Plan to d/c home with outpatient follow up.      Quintella Reichert, MD 09/04/16 [phone]

## 2016-09-02 NOTE — ED Notes (Signed)
Gave pt cup of water per Dr. Laverta Baltimore.

## 2016-09-05 ENCOUNTER — Other Ambulatory Visit (HOSPITAL_COMMUNITY): Payer: Self-pay

## 2016-09-05 NOTE — Progress Notes (Signed)
Paramedicine Encounter    Patient ID: [name], female    DOB: 09-25-1950, 66 y.o.   MRN: [id number]    Patient Care Team: [name], MD as PCP - General (Family Medicine)  Patient Active Problem List   Diagnosis Date Noted  . Paroxysmal A-fib ([place])   . Biventricular automatic implantable cardioverter defibrillator in situ   . Sepsis ([place]) 04/20/2016  . UTI (urinary tract infection) 04/20/2016  . Dyspnea 07/19/2015  . Interstitial lung disease ([place]) 11/20/2014  . SIRS (systemic inflammatory response syndrome) ([place]) 02/03/2014  . IDDM (insulin dependent diabetes mellitus) ([place]) 02/03/2014  . Tachycardia 06/14/2013  . Chronic systolic CHF (congestive heart failure) ([place]) 09/29/2012  . Hypoxia 03/06/2012  . Hypertensive heart disease 03/06/2012  . Nonischemic cardiomyopathy ([place]) 03/06/2012  . Tobacco abuse 03/06/2012  . Type 2 diabetes mellitus ([place]) 03/06/2012  . Hypertension 03/06/2012  . CAD (coronary artery disease), native coronary artery 03/06/2012  . Acute on chronic combined systolic and diastolic heart failure ([place]) 03/06/2012  . Hypokalemia 03/06/2012  . Acute bronchitis 02/01/2009  . Sleep apnea 02/01/2009  . Chest pain 02/01/2009    Current Outpatient Prescriptions:  .  apixaban (ELIQUIS) 5 MG TABS tablet, Take 1 tablet (5 mg total) by mouth 2 (two) times daily., Disp: 60 tablet, Rfl: 3 .  atorvastatin (LIPITOR) 40 MG tablet, Take 1 tablet (40 mg total) by mouth daily., Disp: 30 tablet, Rfl: 3 .  carvedilol (COREG) 12.5 MG tablet, Take 1 tablet (12.5 mg total) by mouth 2 (two) times daily with a meal., Disp: 60 tablet, Rfl: 3 .  furosemide (LASIX) 40 MG tablet, Take 1 tablet (40 mg total) by mouth 2 (two) times daily., Disp: 60 tablet, Rfl: 3 .  isosorbide-hydrALAZINE (BIDIL) 20-37.5 MG tablet, Take 1 tablet by mouth 3 (three) times daily., Disp: 90 tablet, Rfl: 3 .  metFORMIN (GLUCOPHAGE) 1000 MG tablet, TAKE 1 TABLET(1000 MG) BY MOUTH TWICE DAILY WITH  A MEAL, Disp: 60 tablet, Rfl: 0 .  sacubitril-valsartan (ENTRESTO) 97-103 MG, Take 1 tablet by mouth 2 (two) times daily., Disp: 60 tablet, Rfl: 6 .  spironolactone (ALDACTONE) 25 MG tablet, Take 1 tablet (25 mg total) by mouth daily. (Patient taking differently: Take 25 mg by mouth every evening. ), Disp: 30 tablet, Rfl: 3 .  acetaminophen (TYLENOL) 500 MG tablet, Take 1 tablet (500 mg total) by mouth every 6 (six) hours as needed. (Patient not taking: Reported on 09/02/2016), Disp: 30 tablet, Rfl: 0 .  albuterol (PROVENTIL HFA;VENTOLIN HFA) 108 (90 Base) MCG/ACT inhaler, Inhale 2 puffs into the lungs every 6 (six) hours as needed for wheezing or shortness of breath., Disp: 18 g, Rfl: 0 .  glucose blood (FREESTYLE TEST STRIPS) test strip, Use as instructed, Disp: 100 each, Rfl: 0 .  glucose monitoring kit (FREESTYLE) monitoring kit, 1 each by Does not apply route 4 (four) times daily - after meals and at bedtime. 1 month Diabetic Testing Supplies for QAC-QHS accuchecks., Disp: 1 each, Rfl: 1 .  ibuprofen (ADVIL,MOTRIN) 600 MG tablet, Take 1 tablet (600 mg total) by mouth every 6 (six) hours as needed. (Patient not taking: Reported on 09/02/2016), Disp: 30 tablet, Rfl: 0 .  insulin aspart (NOVOLOG FLEXPEN) 100 UNIT/ML FlexPen, 0-20 Units, Subcutaneous, 3 times daily with meals CBG < 70: implement hypoglycemia protocol CBG 70 - 120: 0 units CBG 121 - 150: 3 units CBG 151 - 200: 4 units CBG 201 - 250: 7 units CBG 251 - 300: 11 units  CBG 301 - 350: 15 units CBG 351 - 400: 20 units CBG > 400: call MD, Disp: 15 mL, Rfl: 0 .  Insulin Glargine (LANTUS SOLOSTAR) 100 UNIT/ML Solostar Pen, Inject 55 Units into the skin daily. (Patient taking differently: Inject 55 Units into the skin at bedtime. ), Disp: 15 mL, Rfl: 0 .  Insulin Pen Needle 32G X 8 MM MISC, Use as directed, Disp: 100 each, Rfl: 0 .  Lancets (FREESTYLE) lancets, Use as instructed, Disp: 100 each, Rfl: 0 .  oxyCODONE-acetaminophen (PERCOCET/ROXICET)  5-325 MG tablet, Take 1 tablet by mouth every 6 (six) hours as needed for severe pain., Disp: 8 tablet, Rfl: 0 Allergies  Allergen Reactions  . Morphine And Related Nausea And Vomiting and Other (See Comments)    Severe nausea     Social History   Social History  . Marital status: Single    Spouse name: N/A  . Number of children: N/A  . Years of education: N/A   Occupational History  . Not on file.   Social History Main Topics  . Smoking status: Former Smoker    Packs/day: 1.00    Years: 40.00    Types: Cigarettes    Start date: 06/23/1972    Quit date: 03/26/2012  . Smokeless tobacco: Never Used  . Alcohol use No  . Drug use: No  . Sexual activity: Yes   Other Topics Concern  . Not on file   Social History Narrative  . No narrative on file    Physical Exam  Pulmonary/Chest: No respiratory distress.  Abdominal: She exhibits no distension. There is no tenderness. There is no guarding.  Musculoskeletal: She exhibits no edema.  Skin: Skin is warm and dry. She is not diaphoretic.        Future Appointments Date Time Provider [place]  09/11/2016 10:30 AM [facility] PA/NP [facility] None    ATF pt CAO x4 working on her pill box.  She stated that she had to go to the ED Tuesday for dizziness.  Pt stated that she may have taken her meds wrong due to the dizziness usually goes away after a few mins.  Pt stated that she has been taking her meds as prescribed.  Her bp is elevated and has been for the past 3 vists.  Pt has an appointment thurs at [facility] clinic.  She denies sob, dizziness and chest pain.  After taking her CBG she stated that she is about to take 20 units of insulin.  Pt's rx bottles verified and pill box refilled.    BP (!) 174/104 (BP Location: Right Arm, Patient Position: Sitting, Cuff Size: Normal)   Pulse 93   Resp 12   SpO2 100%   cbg 431  Weight yesterday-didn't weigh Last visit weight-230    Kie Calvin, EMT  Paramedic 09/05/2016    ACTION: Home visit completed

## 2016-09-11 ENCOUNTER — Ambulatory Visit (HOSPITAL_COMMUNITY)
Admission: RE | Admit: 2016-09-11 | Discharge: 2016-09-11 | Disposition: A | Discharge status: Home / Self Care | Payer: Medicare Other | Source: Ambulatory Visit | Attending: Internal Medicine | Admitting: Internal Medicine

## 2016-09-11 VITALS — BP 148/78 | HR 91 | Wt 227.4 lb

## 2016-09-11 DIAGNOSIS — I2510 Atherosclerotic heart disease of native coronary artery without angina pectoris: Secondary | ICD-10-CM | POA: Insufficient documentation

## 2016-09-11 DIAGNOSIS — E785 Hyperlipidemia, unspecified: Secondary | ICD-10-CM | POA: Diagnosis not present

## 2016-09-11 DIAGNOSIS — Z7901 Long term (current) use of anticoagulants: Secondary | ICD-10-CM | POA: Diagnosis not present

## 2016-09-11 DIAGNOSIS — I5022 Chronic systolic (congestive) heart failure: Secondary | ICD-10-CM | POA: Insufficient documentation

## 2016-09-11 DIAGNOSIS — I447 Left bundle-branch block, unspecified: Secondary | ICD-10-CM | POA: Diagnosis not present

## 2016-09-11 DIAGNOSIS — Z95810 Presence of automatic (implantable) cardiac defibrillator: Secondary | ICD-10-CM | POA: Diagnosis not present

## 2016-09-11 DIAGNOSIS — I110 Hypertensive heart disease with heart failure: Secondary | ICD-10-CM | POA: Diagnosis not present

## 2016-09-11 DIAGNOSIS — I429 Cardiomyopathy, unspecified: Secondary | ICD-10-CM | POA: Diagnosis not present

## 2016-09-11 DIAGNOSIS — I480 Paroxysmal atrial fibrillation: Secondary | ICD-10-CM | POA: Insufficient documentation

## 2016-09-11 DIAGNOSIS — Z79899 Other long term (current) drug therapy: Secondary | ICD-10-CM | POA: Insufficient documentation

## 2016-09-11 DIAGNOSIS — Z87891 Personal history of nicotine dependence: Secondary | ICD-10-CM | POA: Insufficient documentation

## 2016-09-11 DIAGNOSIS — Z96643 Presence of artificial hip joint, bilateral: Secondary | ICD-10-CM | POA: Insufficient documentation

## 2016-09-11 DIAGNOSIS — E119 Type 2 diabetes mellitus without complications: Secondary | ICD-10-CM | POA: Diagnosis not present

## 2016-09-11 DIAGNOSIS — Z794 Long term (current) use of insulin: Secondary | ICD-10-CM | POA: Diagnosis not present

## 2016-09-11 DIAGNOSIS — Z96651 Presence of right artificial knee joint: Secondary | ICD-10-CM | POA: Diagnosis not present

## 2016-09-11 LAB — BASIC METABOLIC PANEL
Anion gap: 10 (ref 5–15)
BUN: 18 mg/dL (ref 6–20)
CO2: 32 mmol/L (ref 22–32)
Calcium: 9.9 mg/dL (ref 8.9–10.3)
Chloride: 96 mmol/L — ABNORMAL LOW (ref 101–111)
Creatinine, Ser: 1.13 mg/dL — ABNORMAL HIGH (ref 0.44–1.00)
GFR calc Af Amer: 59 mL/min — ABNORMAL LOW (ref >60)
GFR calc non Af Amer: 51 mL/min — ABNORMAL LOW (ref >60)
Glucose, Bld: 332 mg/dL — ABNORMAL HIGH (ref 65–99)
Potassium: 3.9 mmol/L (ref 3.5–5.1)
Sodium: 138 mmol/L (ref 135–145)

## 2016-09-11 MED ORDER — ISOSORB DINITRATE-HYDRALAZINE 20-37.5 MG PO TABS: 2.0000 | ORAL_TABLET | Freq: Three times a day (TID) | ORAL | 6 refills | Status: DC

## 2016-09-11 NOTE — Progress Notes (Signed)
Patient ID: [name], female   DOB: 08-14-1950, 66 y.o.   MRN: [id number]   ADVANCED HF CLINIC NOTE  PCP: Dr. Alyson Ingles Cardiology: Dr Aundra Dubin  71 yo with history of nonischemic cardiomyopathy.  She was admitted with CHF exacerbation in 02/2012.  EF 20-25% on echo, LHC with nonobstructive CAD.  She was started on cardiac meds and discharged.  In 07/2012, she was admitted again with CHF exacerbation.  She had run out of Lasix.  She was taking her other heart medications as ordered, however.  She was diuresed and discharged. She had a chronic LBBB, and Medtronic CRT-D device was placed in 10/14.  She was admitted in 6/16 with hypertensive emergency and CHF exacerbation.   Also of note, she had PFTs in 9/16 showing restrictive spirometry with low lung volume and DLCO.  She was supposed to get a high resolution CT to evaluate for interstitial lung disease but never had the study.     Admitted March 2018 with urosepsis--> E Coli Bacteremia. Completed antibiotic course. EF was down from previous to 30-35%. Also had atrial fibrillation so she was loaded on amiodarone. Placed on eliquis. Discharge weight was 208 pounds.  She is now off amiodarone.   [name] presents today for HF follow up. Overall feeling well, at baseline. She is staying active, trying to exercise more by walking. She does get SOB walking around in stores, uses motorized cart. She endorses periodic orthopnea. No PND. Taking all medications. Drinking more than 2L a day, eating high sodium foods. Weights at home 225 -227 pounds. Denies melena and hematochezia   Labs (2/14): SPEP negative, UPEP negative, HIV negative Labs (7/14): K 4, creatinine 1.0, LDL 66 Labs (08/25/12): K 4, creatinine 1.0 Labs (10/06/12) : K 3.3 creatinine 0.7 pro-bnp 188 Labs (11/15/12): K 4.4, creatinine 1.04, pro-BNP 468 Labs (3/15): K 4.3 Creatinine 0.83 Labs (5/15): K 4.1 Creatinine 0.85 Labs (9/16): K 4.3, creatinine 1.01 => 0.84, HCT 38.1, BNP 234 Labs  (6/17): K 3.6, creatinine 0.78 Labs (05/05/2016): K 4.9 Creatinine 1.25. Labs (5/18): TSH normal, LFTs, normal, K 4.1, creatinine 1.16   PMH: 1. HTN 2. Type II diabetes 3. Nonischemic cardiomyopathy: ? Due to HTN versus LBBB CMP.  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  Echo (2/14) with EF 20-25%.  Echo (7/14) with EF 25%, diffuse hypokinesis.  HIV, SPEP, UPEP negative.  Has LBBB. CRT-D 10/2012 (Medtronic).  Echo (4/15) with EF 45-50%, mild diffuse hypokinesis, PA systolic pressure 38 mmHg.  Echo (6/16) with EF 40-45%, mild LVH, septal and inferior hypokinesis.   - Echo (3/18): EF 30-35%. Grade 1 DD - Echo (6/18): EF 45-50%, moderate LVH, normal RV size with mildly decreased systolic function.  4. Chronic LBBB 5. Right TKR 6. Bilateral THR.  7. Hyperlipidemia 8. ?COPD: Has oxygen for use with exertion.  - PFTs (9/16) with FEV1 77%, FVC 76%, ratio 101%, TLC 63%, DLCO 41% => moderate restrictive deficit  9. Atrial fibrillation: Paroxysmal.   SH: Prior smoker, quit 2/14.  Never drank ETOH.  No drugs. Lives with son.   FH: Mother with "heart trouble."   ROS: All systems reviewed and negative except as per HPI.   Current Outpatient Prescriptions  Medication Sig Dispense Refill  . acetaminophen (TYLENOL) 500 MG tablet Take 1 tablet (500 mg total) by mouth every 6 (six) hours as needed. 30 tablet 0  . albuterol (PROVENTIL HFA;VENTOLIN HFA) 108 (90 Base) MCG/ACT inhaler Inhale 2 puffs into the lungs every 6 (six) hours as  needed for wheezing or shortness of breath. 18 g 0  . apixaban (ELIQUIS) 5 MG TABS tablet Take 1 tablet (5 mg total) by mouth 2 (two) times daily. 60 tablet 3  . atorvastatin (LIPITOR) 40 MG tablet Take 1 tablet (40 mg total) by mouth daily. 30 tablet 3  . carvedilol (COREG) 12.5 MG tablet Take 1 tablet (12.5 mg total) by mouth 2 (two) times daily with a meal. 60 tablet 3  . furosemide (LASIX) 40 MG tablet Take 1 tablet (40 mg total) by mouth 2 (two) times daily. 60 tablet 3  .  glucose blood (FREESTYLE TEST STRIPS) test strip Use as instructed 100 each 0  . glucose monitoring kit (FREESTYLE) monitoring kit 1 each by Does not apply route 4 (four) times daily - after meals and at bedtime. 1 month Diabetic Testing Supplies for QAC-QHS accuchecks. 1 each 1  . insulin aspart (NOVOLOG FLEXPEN) 100 UNIT/ML FlexPen 0-20 Units, Subcutaneous, 3 times daily with meals CBG < 70: implement hypoglycemia protocol CBG 70 - 120: 0 units CBG 121 - 150: 3 units CBG 151 - 200: 4 units CBG 201 - 250: 7 units CBG 251 - 300: 11 units CBG 301 - 350: 15 units CBG 351 - 400: 20 units CBG > 400: call MD 15 mL 0  . Insulin Glargine (LANTUS SOLOSTAR) 100 UNIT/ML Solostar Pen Inject 50 Units into the skin daily at 10 pm.    . Insulin Pen Needle 32G X 8 MM MISC Use as directed 100 each 0  . isosorbide-hydrALAZINE (BIDIL) 20-37.5 MG tablet Take 1 tablet by mouth 3 (three) times daily. 90 tablet 3  . Lancets (FREESTYLE) lancets Use as instructed 100 each 0  . metFORMIN (GLUCOPHAGE) 1000 MG tablet TAKE 1 TABLET(1000 MG) BY MOUTH TWICE DAILY WITH A MEAL 60 tablet 0  . oxyCODONE-acetaminophen (PERCOCET/ROXICET) 5-325 MG tablet Take 1 tablet by mouth every 6 (six) hours as needed for severe pain. 8 tablet 0  . sacubitril-valsartan (ENTRESTO) 97-103 MG Take 1 tablet by mouth 2 (two) times daily. 60 tablet 6  . spironolactone (ALDACTONE) 25 MG tablet Take 1 tablet (25 mg total) by mouth daily. 30 tablet 3   No current facility-administered medications for this encounter.     Vitals:   09/11/16 1040  BP: (!) 148/78  Pulse: 91  SpO2: 97%  Weight: 227 lb 6.4 oz (103.1 kg)   General: Well appearing. No resp difficulty. HEENT: Normal Neck: Supple. JVP 8 cm. Carotids 2+ bilat; no bruits. No thyromegaly or nodule noted. Cor: PMI nondisplaced. RRR, No M/G/R noted Lungs: CTAB, normal effort. Abdomen: Soft, non-tender, non-distended, no HSM. No bruits or masses. +BS  Extremities: No cyanosis,  clubbing, rash, R and LLE no edema.  Neuro: Alert & orientedx3, cranial nerves grossly intact. moves all 4 extremities w/o difficulty. Affect pleasant   Assessment/Plan: 1. Chronic systolic CHF: Nonischemic cardiomyopathy, HTN. S/P CRT-D (Medtronic). Echo 06/2016 with improved EF 45-50%. - NYHA II - Volume status stable on exam. Continue Lasix 40 mg BID. We discussed sliding scale diuretics.  - Continue Coreg 12.5 mg BID.  - Continue Entresto 97/103 mg BID - Increase Bidil to 2 tabs TID.  - Continue Spiro 25 mg daily.   2. Hyperlipidemia:  - Continue atorvastatin   3. HTN: Hypertensive today - increase Bidil as above.   4. PAF: No longer on Amiodarone. NSR today by exam. - Continue Eliquis for anticoagulation.   BMET today. Follow up in 3 months with Dr. Aundra Dubin.  Continue parmedicine.   Arbutus Leas, NP  10:47 AM  09/11/2016  Greater than 50% of the (total minutes 30) visit spent in counseling/coordination of care regarding disease state education, dietary compliance and medication instructions.

## 2016-09-11 NOTE — Progress Notes (Signed)
Advanced Heart Failure Medication Review by a Pharmacist  Does the patient  feel that his/her medications are working for him/her?  yes  Has the patient been experiencing any side effects to the medications prescribed?  no  Does the patient measure his/her own blood pressure or blood glucose at home?  yes   Does the patient have any problems obtaining medications due to transportation or finances?   No - [employer] Medicare  Understanding of regimen: fair Understanding of indications: fair Potential of compliance: fair Patient understands to avoid NSAIDs. Patient understands to avoid decongestants.  Issues to address at subsequent visits: None   Pharmacist comments:  [name] is a 66 yo F presenting without a medication list and with fair understanding of her regimen. She reports good compliance with her regimen and did not have any specific medication-related questions or concerns for me at this time.   Ruta Hinds. Velva Harman, PharmD, BCPS, CPP Clinical Pharmacist Pager: [phone] Phone: [phone] 09/11/2016 10:54 AM      Time with patient: 10 minutes Preparation and documentation time: 2 minutes Total time: 12 minutes

## 2016-09-11 NOTE — Patient Instructions (Signed)
Routine lab work today. Will notify you of abnormal results, otherwise no news is good news!  INCREASE Bidil to 2 tabs three times daily.  Follow up 3 months with Dr. Aundra Dubin.  Take all medication as prescribed the day of your appointment. Bring all medications with you to your appointment.  Do the following things EVERYDAY: 1) Weigh yourself in the morning before breakfast. Write it down and keep it in a log. 2) Take your medicines as prescribed 3) Eat low salt foods-Limit salt (sodium) to 2000 mg per day.  4) Stay as active as you can everyday 5) Limit all fluids for the day to less than 2 liters

## 2016-09-18 ENCOUNTER — Other Ambulatory Visit (HOSPITAL_COMMUNITY): Payer: Self-pay

## 2016-09-18 NOTE — Progress Notes (Signed)
Paramedicine Encounter    Patient ID: [name], female    DOB: July 23, 1950, 66 y.o.   MRN: [id number]    Patient Care Team: [name], MD as PCP - General (Family Medicine)  Patient Active Problem List   Diagnosis Date Noted  . Paroxysmal A-fib ([place])   . Biventricular automatic implantable cardioverter defibrillator in situ   . Sepsis ([place]) 04/20/2016  . UTI (urinary tract infection) 04/20/2016  . Dyspnea 07/19/2015  . Interstitial lung disease ([place]) 11/20/2014  . SIRS (systemic inflammatory response syndrome) ([place]) 02/03/2014  . IDDM (insulin dependent diabetes mellitus) ([place]) 02/03/2014  . Tachycardia 06/14/2013  . Chronic systolic CHF (congestive heart failure) ([place]) 09/29/2012  . Hypoxia 03/06/2012  . Hypertensive heart disease 03/06/2012  . Nonischemic cardiomyopathy ([place]) 03/06/2012  . Tobacco abuse 03/06/2012  . Type 2 diabetes mellitus ([place]) 03/06/2012  . Hypertension 03/06/2012  . CAD (coronary artery disease), native coronary artery 03/06/2012  . Acute on chronic combined systolic and diastolic heart failure ([place]) 03/06/2012  . Hypokalemia 03/06/2012  . Acute bronchitis 02/01/2009  . Sleep apnea 02/01/2009  . Chest pain 02/01/2009    Current Outpatient Prescriptions:  .  apixaban (ELIQUIS) 5 MG TABS tablet, Take 1 tablet (5 mg total) by mouth 2 (two) times daily., Disp: 60 tablet, Rfl: 3 .  atorvastatin (LIPITOR) 40 MG tablet, Take 1 tablet (40 mg total) by mouth daily., Disp: 30 tablet, Rfl: 3 .  carvedilol (COREG) 12.5 MG tablet, Take 1 tablet (12.5 mg total) by mouth 2 (two) times daily with a meal., Disp: 60 tablet, Rfl: 3 .  furosemide (LASIX) 40 MG tablet, Take 1 tablet (40 mg total) by mouth 2 (two) times daily., Disp: 60 tablet, Rfl: 3 .  glucose monitoring kit (FREESTYLE) monitoring kit, 1 each by Does not apply route 4 (four) times daily - after meals and at bedtime. 1 month Diabetic Testing Supplies for QAC-QHS accuchecks., Disp: 1 each, Rfl:  1 .  insulin aspart (NOVOLOG FLEXPEN) 100 UNIT/ML FlexPen, 0-20 Units, Subcutaneous, 3 times daily with meals CBG < 70: implement hypoglycemia protocol CBG 70 - 120: 0 units CBG 121 - 150: 3 units CBG 151 - 200: 4 units CBG 201 - 250: 7 units CBG 251 - 300: 11 units CBG 301 - 350: 15 units CBG 351 - 400: 20 units CBG > 400: call MD, Disp: 15 mL, Rfl: 0 .  Insulin Glargine (LANTUS SOLOSTAR) 100 UNIT/ML Solostar Pen, Inject 50 Units into the skin daily at 10 pm., Disp: , Rfl:  .  Insulin Pen Needle 32G X 8 MM MISC, Use as directed, Disp: 100 each, Rfl: 0 .  isosorbide-hydrALAZINE (BIDIL) 20-37.5 MG tablet, Take 2 tablets by mouth 3 (three) times daily., Disp: 180 tablet, Rfl: 6 .  metFORMIN (GLUCOPHAGE) 1000 MG tablet, TAKE 1 TABLET(1000 MG) BY MOUTH TWICE DAILY WITH A MEAL, Disp: 60 tablet, Rfl: 0 .  sacubitril-valsartan (ENTRESTO) 97-103 MG, Take 1 tablet by mouth 2 (two) times daily., Disp: 60 tablet, Rfl: 6 .  spironolactone (ALDACTONE) 25 MG tablet, Take 1 tablet (25 mg total) by mouth daily., Disp: 30 tablet, Rfl: 3 .  acetaminophen (TYLENOL) 500 MG tablet, Take 1 tablet (500 mg total) by mouth every 6 (six) hours as needed., Disp: 30 tablet, Rfl: 0 .  albuterol (PROVENTIL HFA;VENTOLIN HFA) 108 (90 Base) MCG/ACT inhaler, Inhale 2 puffs into the lungs every 6 (six) hours as needed for wheezing or shortness of breath., Disp: 18 g, Rfl: 0 .  glucose blood (FREESTYLE TEST STRIPS) test strip, Use as instructed, Disp: 100 each, Rfl: 0 .  Lancets (FREESTYLE) lancets, Use as instructed, Disp: 100 each, Rfl: 0 .  oxyCODONE-acetaminophen (PERCOCET/ROXICET) 5-325 MG tablet, Take 1 tablet by mouth every 6 (six) hours as needed for severe pain., Disp: 8 tablet, Rfl: 0 Allergies  Allergen Reactions  . Morphine And Related Nausea And Vomiting and Other (See Comments)    Severe nausea     Social History   Social History  . Marital status: Single    Spouse name: N/A  . Number of children: N/A  . Years  of education: N/A   Occupational History  . Not on file.   Social History Main Topics  . Smoking status: Former Smoker    Packs/day: 1.00    Years: 40.00    Types: Cigarettes    Start date: 06/23/1972    Quit date: 03/26/2012  . Smokeless tobacco: Never Used  . Alcohol use No  . Drug use: No  . Sexual activity: Yes   Other Topics Concern  . Not on file   Social History Narrative  . No narrative on file    Physical Exam  Pulmonary/Chest: No respiratory distress. She has no wheezes. She has no rales.  Abdominal: She exhibits no distension. There is no tenderness. There is no guarding.  Musculoskeletal: She exhibits no edema.  Skin: Skin is warm and dry. She is not diaphoretic.        Future Appointments Date Time Provider [place]  12/09/2016 9:20 AM Larey Dresser, MD [facility] None    ATF pt CAO x4 laying across her bed c/o dizziness.  She stated that she just took her morning meds with some oranges.  She stated that she will eat breakfast after the dizziness goes away.  Pt has no c/o chest pain, headache or sob.  She was mistaken that she was supposed to take bidil twice daily but she's supposed to take bidil 3 times daily.  Her pill box will be revised after I pick up her meds from the pharmacy later today.  Pt is still working on eating foods low in sodium.  She hasn't missed any medication doses besides the bidil.  Due to pt being dizzy I will check her weight when I return later. rx bottles verified and pill box refilled.   BP 126/80 (BP Location: Right Arm, Patient Position: Sitting)   Pulse 90   Resp 12   SpO2 97%    **rx called in:  entresto bidil  Antonis Lor, EMT Paramedic 09/18/2016    ACTION: Home visit completed

## 2016-09-18 NOTE — Progress Notes (Signed)
Re-visit   Pt was missing bidil from her pill box therefore I went to the pharmacy to get them.  I revised pt's pill box according to epic. Pt stated that she was told to take only two doses of bidil daily, I confirmed with Chantel this am that she is supposed to take bidil three times daily.

## 2016-09-26 ENCOUNTER — Other Ambulatory Visit (HOSPITAL_COMMUNITY): Payer: Self-pay | Admitting: Adult Health

## 2016-09-26 ENCOUNTER — Other Ambulatory Visit (HOSPITAL_COMMUNITY): Payer: Self-pay

## 2016-09-26 NOTE — Progress Notes (Signed)
Paramedicine Encounter    Patient ID: [name], female    DOB: 1950-02-11, 66 y.o.   MRN: [id number]    Patient Care Team: [name], MD as PCP - General (Family Medicine)  Patient Active Problem List   Diagnosis Date Noted  . Paroxysmal A-fib ([place])   . Biventricular automatic implantable cardioverter defibrillator in situ   . Sepsis ([place]) 04/20/2016  . UTI (urinary tract infection) 04/20/2016  . Dyspnea 07/19/2015  . Interstitial lung disease ([place]) 11/20/2014  . SIRS (systemic inflammatory response syndrome) ([place]) 02/03/2014  . IDDM (insulin dependent diabetes mellitus) ([place]) 02/03/2014  . Tachycardia 06/14/2013  . Chronic systolic CHF (congestive heart failure) ([place]) 09/29/2012  . Hypoxia 03/06/2012  . Hypertensive heart disease 03/06/2012  . Nonischemic cardiomyopathy ([place]) 03/06/2012  . Tobacco abuse 03/06/2012  . Type 2 diabetes mellitus ([place]) 03/06/2012  . Hypertension 03/06/2012  . CAD (coronary artery disease), native coronary artery 03/06/2012  . Acute on chronic combined systolic and diastolic heart failure ([place]) 03/06/2012  . Hypokalemia 03/06/2012  . Acute bronchitis 02/01/2009  . Sleep apnea 02/01/2009  . Chest pain 02/01/2009    Current Outpatient Prescriptions:  .  apixaban (ELIQUIS) 5 MG TABS tablet, Take 1 tablet (5 mg total) by mouth 2 (two) times daily., Disp: 60 tablet, Rfl: 3 .  atorvastatin (LIPITOR) 40 MG tablet, Take 1 tablet (40 mg total) by mouth daily., Disp: 30 tablet, Rfl: 3 .  carvedilol (COREG) 12.5 MG tablet, Take 1 tablet (12.5 mg total) by mouth 2 (two) times daily with a meal., Disp: 60 tablet, Rfl: 3 .  furosemide (LASIX) 40 MG tablet, Take 1 tablet (40 mg total) by mouth 2 (two) times daily., Disp: 60 tablet, Rfl: 3 .  glucose blood (FREESTYLE TEST STRIPS) test strip, Use as instructed, Disp: 100 each, Rfl: 0 .  glucose monitoring kit (FREESTYLE) monitoring kit, 1 each by Does not apply route 4 (four) times daily - after meals  and at bedtime. 1 month Diabetic Testing Supplies for QAC-QHS accuchecks., Disp: 1 each, Rfl: 1 .  insulin aspart (NOVOLOG FLEXPEN) 100 UNIT/ML FlexPen, 0-20 Units, Subcutaneous, 3 times daily with meals CBG < 70: implement hypoglycemia protocol CBG 70 - 120: 0 units CBG 121 - 150: 3 units CBG 151 - 200: 4 units CBG 201 - 250: 7 units CBG 251 - 300: 11 units CBG 301 - 350: 15 units CBG 351 - 400: 20 units CBG > 400: call MD, Disp: 15 mL, Rfl: 0 .  Insulin Glargine (LANTUS SOLOSTAR) 100 UNIT/ML Solostar Pen, Inject 50 Units into the skin daily at 10 pm., Disp: , Rfl:  .  Insulin Pen Needle 32G X 8 MM MISC, Use as directed, Disp: 100 each, Rfl: 0 .  isosorbide-hydrALAZINE (BIDIL) 20-37.5 MG tablet, Take 2 tablets by mouth 3 (three) times daily., Disp: 180 tablet, Rfl: 6 .  metFORMIN (GLUCOPHAGE) 1000 MG tablet, TAKE 1 TABLET(1000 MG) BY MOUTH TWICE DAILY WITH A MEAL, Disp: 60 tablet, Rfl: 0 .  sacubitril-valsartan (ENTRESTO) 97-103 MG, Take 1 tablet by mouth 2 (two) times daily., Disp: 60 tablet, Rfl: 6 .  spironolactone (ALDACTONE) 25 MG tablet, Take 1 tablet (25 mg total) by mouth daily., Disp: 30 tablet, Rfl: 3 .  acetaminophen (TYLENOL) 500 MG tablet, Take 1 tablet (500 mg total) by mouth every 6 (six) hours as needed., Disp: 30 tablet, Rfl: 0 .  albuterol (PROVENTIL HFA;VENTOLIN HFA) 108 (90 Base) MCG/ACT inhaler, Inhale 2 puffs into the lungs every 6 (  six) hours as needed for wheezing or shortness of breath., Disp: 18 g, Rfl: 0 .  Lancets (FREESTYLE) lancets, Use as instructed, Disp: 100 each, Rfl: 0 .  oxyCODONE-acetaminophen (PERCOCET/ROXICET) 5-325 MG tablet, Take 1 tablet by mouth every 6 (six) hours as needed for severe pain., Disp: 8 tablet, Rfl: 0 Allergies  Allergen Reactions  . Morphine And Related Nausea And Vomiting and Other (See Comments)    Severe nausea     Social History   Social History  . Marital status: Single    Spouse name: N/A  . Number of children: N/A  . Years of  education: N/A   Occupational History  . Not on file.   Social History Main Topics  . Smoking status: Former Smoker    Packs/day: 1.00    Years: 40.00    Types: Cigarettes    Start date: 06/23/1972    Quit date: 03/26/2012  . Smokeless tobacco: Never Used  . Alcohol use No  . Drug use: No  . Sexual activity: Yes   Other Topics Concern  . Not on file   Social History Narrative  . No narrative on file    Physical Exam  Pulmonary/Chest: No respiratory distress. She has no wheezes. She has no rales.  Abdominal: She exhibits no distension. There is no tenderness. There is no guarding.  Musculoskeletal: She exhibits no edema.  Skin: Skin is warm and dry. She is not diaphoretic.        Future Appointments Date Time Provider [place]  12/09/2016 9:20 AM Larey Dresser, MD [facility] None    ATF pt CAO x4 sitting on her bed talking on her cell phone with no complaints.  Pt's pill box is on the bed and she has taken all of her meds up until this evening.  Pt denies sob, dizziness and chest pain.  She stated that she just got back in from paying bills.  Pt admits to eating chicken earlier today.  We talked about her food choices.  rx bottles verified and her pill box refilled.     **rx called in: Metformin Spirolactone  BP 100/62 (BP Location: Right Arm, Patient Position: Sitting, Cuff Size: Normal)   Pulse (!) 104   Resp 16   Wt 222 lb 2.2 oz (100.8 kg)   SpO2 97%   BMI 34.79 kg/m  cbg 285 Weight yesterday-222 Last visit weight-227    Braylen Denunzio, EMT Paramedic 09/26/2016    ACTION: Home visit completed

## 2016-10-03 ENCOUNTER — Other Ambulatory Visit (HOSPITAL_COMMUNITY): Payer: Self-pay | Admitting: Internal Medicine

## 2016-10-07 ENCOUNTER — Telehealth (HOSPITAL_COMMUNITY): Payer: Self-pay | Admitting: *Deleted

## 2016-10-07 ENCOUNTER — Other Ambulatory Visit (HOSPITAL_COMMUNITY): Payer: Self-pay | Admitting: Internal Medicine

## 2016-10-07 ENCOUNTER — Other Ambulatory Visit (HOSPITAL_COMMUNITY): Payer: Self-pay

## 2016-10-07 DIAGNOSIS — I5022 Chronic systolic (congestive) heart failure: Secondary | ICD-10-CM

## 2016-10-07 MED ORDER — POTASSIUM CHLORIDE ER 20 MEQ PO TBCR: 40.0000 meq | EXTENDED_RELEASE_TABLET | Freq: Every day | ORAL | 3 refills | Status: DC

## 2016-10-07 NOTE — Addendum Note (Signed)
Addended by: Harvie Junior on: 10/07/2016 10:15 AM   Modules accepted: Orders

## 2016-10-07 NOTE — Telephone Encounter (Signed)
Dee called stating patients weight was up 9lbs (8/31- 222lbs 9/11-231lbs). PT c/o increased SOB and some edema in legs. Per Junie Panning increase lasix to 80mg  bid x 3days start 40mg  of potassium and labs on Friday. [name] will communicate med changes to patient.

## 2016-10-07 NOTE — Progress Notes (Signed)
Paramedicine Encounter    Patient ID: JOEL MERICLE, female    DOB: 1950/11/08, 66 y.o.   MRN: [id number]    Patient Care Team: [name], MD as PCP - General (Family Medicine)  Patient Active Problem List   Diagnosis Date Noted  . Paroxysmal A-fib ([place])   . Biventricular automatic implantable cardioverter defibrillator in situ   . Sepsis ([place]) 04/20/2016  . UTI (urinary tract infection) 04/20/2016  . Dyspnea 07/19/2015  . Interstitial lung disease ([place]) 11/20/2014  . SIRS (systemic inflammatory response syndrome) ([place]) 02/03/2014  . IDDM (insulin dependent diabetes mellitus) ([place]) 02/03/2014  . Tachycardia 06/14/2013  . Chronic systolic CHF (congestive heart failure) ([place]) 09/29/2012  . Hypoxia 03/06/2012  . Hypertensive heart disease 03/06/2012  . Nonischemic cardiomyopathy ([place]) 03/06/2012  . Tobacco abuse 03/06/2012  . Type 2 diabetes mellitus ([place]) 03/06/2012  . Hypertension 03/06/2012  . CAD (coronary artery disease), native coronary artery 03/06/2012  . Acute on chronic combined systolic and diastolic heart failure ([place]) 03/06/2012  . Hypokalemia 03/06/2012  . Acute bronchitis 02/01/2009  . Sleep apnea 02/01/2009  . Chest pain 02/01/2009    Current Outpatient Prescriptions:  .  apixaban (ELIQUIS) 5 MG TABS tablet, Take 1 tablet (5 mg total) by mouth 2 (two) times daily., Disp: 60 tablet, Rfl: 3 .  atorvastatin (LIPITOR) 40 MG tablet, Take 1 tablet (40 mg total) by mouth daily., Disp: 30 tablet, Rfl: 3 .  carvedilol (COREG) 12.5 MG tablet, Take 1 tablet (12.5 mg total) by mouth 2 (two) times daily with a meal., Disp: 60 tablet, Rfl: 3 .  ELIQUIS 5 MG TABS tablet, TAKE 1 TABLET(5 MG) BY MOUTH TWICE DAILY, Disp: 60 tablet, Rfl: 3 .  furosemide (LASIX) 40 MG tablet, Take 1 tablet (40 mg total) by mouth 2 (two) times daily., Disp: 60 tablet, Rfl: 3 .  isosorbide-hydrALAZINE (BIDIL) 20-37.5 MG tablet, Take 2 tablets by mouth 3 (three) times daily., Disp: 180 tablet,  Rfl: 6 .  metFORMIN (GLUCOPHAGE) 1000 MG tablet, TAKE 1 TABLET(1000 MG) BY MOUTH TWICE DAILY WITH A MEAL, Disp: 60 tablet, Rfl: 0 .  sacubitril-valsartan (ENTRESTO) 97-103 MG, Take 1 tablet by mouth 2 (two) times daily., Disp: 60 tablet, Rfl: 6 .  spironolactone (ALDACTONE) 25 MG tablet, Take 1 tablet (25 mg total) by mouth daily., Disp: 30 tablet, Rfl: 3 .  spironolactone (ALDACTONE) 25 MG tablet, TAKE 1 TABLET(25 MG) BY MOUTH DAILY, Disp: 30 tablet, Rfl: 3 .  acetaminophen (TYLENOL) 500 MG tablet, Take 1 tablet (500 mg total) by mouth every 6 (six) hours as needed., Disp: 30 tablet, Rfl: 0 .  albuterol (PROVENTIL HFA;VENTOLIN HFA) 108 (90 Base) MCG/ACT inhaler, Inhale 2 puffs into the lungs every 6 (six) hours as needed for wheezing or shortness of breath., Disp: 18 g, Rfl: 0 .  glucose blood (FREESTYLE TEST STRIPS) test strip, Use as instructed, Disp: 100 each, Rfl: 0 .  glucose monitoring kit (FREESTYLE) monitoring kit, 1 each by Does not apply route 4 (four) times daily - after meals and at bedtime. 1 month Diabetic Testing Supplies for QAC-QHS accuchecks., Disp: 1 each, Rfl: 1 .  insulin aspart (NOVOLOG FLEXPEN) 100 UNIT/ML FlexPen, 0-20 Units, Subcutaneous, 3 times daily with meals CBG < 70: implement hypoglycemia protocol CBG 70 - 120: 0 units CBG 121 - 150: 3 units CBG 151 - 200: 4 units CBG 201 - 250: 7 units CBG 251 - 300: 11 units CBG 301 - 350: 15 units CBG 351 - 400:  20 units CBG > 400: call MD, Disp: 15 mL, Rfl: 0 .  Insulin Glargine (LANTUS SOLOSTAR) 100 UNIT/ML Solostar Pen, Inject 50 Units into the skin daily at 10 pm., Disp: , Rfl:  .  Insulin Pen Needle 32G X 8 MM MISC, Use as directed, Disp: 100 each, Rfl: 0 .  Lancets (FREESTYLE) lancets, Use as instructed, Disp: 100 each, Rfl: 0 .  oxyCODONE-acetaminophen (PERCOCET/ROXICET) 5-325 MG tablet, Take 1 tablet by mouth every 6 (six) hours as needed for severe pain., Disp: 8 tablet, Rfl: 0 Allergies  Allergen Reactions  . Morphine And  Related Nausea And Vomiting and Other (See Comments)    Severe nausea     Social History   Social History  . Marital status: Single    Spouse name: N/A  . Number of children: N/A  . Years of education: N/A   Occupational History  . Not on file.   Social History Main Topics  . Smoking status: Former Smoker    Packs/day: 1.00    Years: 40.00    Types: Cigarettes    Start date: 06/23/1972    Quit date: 03/26/2012  . Smokeless tobacco: Never Used  . Alcohol use No  . Drug use: No  . Sexual activity: Yes   Other Topics Concern  . Not on file   Social History Narrative  . No narrative on file    Physical Exam  Pulmonary/Chest: No respiratory distress. She has no wheezes. She has no rales.  Abdominal: She exhibits no distension. There is no tenderness. There is no guarding.  Musculoskeletal: She exhibits edema.  Skin: Skin is warm and dry. She is not diaphoretic.        Future Appointments Date Time Provider [place]  12/09/2016 9:20 AM Larey Dresser, MD [facility] None    ATF pt CAO x4 sitting on her bed waiting to see her PCP.  Pt is still seeing dr. Alyson Ingles.  Today is her regular appointment.  She has gained weight about 9lbs since our last visit. Pt is also sob after walking from the kitchen to her bedroom.  Pt stated that she has been laying around the house eating.  Pt missed one dose of meds on Friday afternoon.  rx bottles verified and pill box refilled.   I picked up 3 meds from the pharmacy today.  BP (!) 152/98   Pulse 91   Resp 16   Wt 231 lb 3.2 oz (104.9 kg)   SpO2 97%   BMI 36.21 kg/m   Weight yesterday- Last visit weight-222 (8/31)    Luanna Weesner, EMT Paramedic 10/07/2016    ACTION: Home visit completed

## 2016-10-09 ENCOUNTER — Other Ambulatory Visit (HOSPITAL_COMMUNITY): Payer: Self-pay

## 2016-10-09 NOTE — Progress Notes (Signed)
Paramedicine Encounter    Patient ID: [name], female    DOB: [birth year], 66 y.o.   MRN: [id number]    Patient Care Team: [name], MD as PCP - General (Family Medicine)  Patient Active Problem List   Diagnosis Date Noted  . Paroxysmal A-fib ([place])   . Biventricular automatic implantable cardioverter defibrillator in situ   . Sepsis ([place]) 04/20/2016  . UTI (urinary tract infection) 04/20/2016  . Dyspnea 07/19/2015  . Interstitial lung disease ([place]) 11/20/2014  . SIRS (systemic inflammatory response syndrome) ([place]) 02/03/2014  . IDDM (insulin dependent diabetes mellitus) ([place]) 02/03/2014  . Tachycardia 06/14/2013  . Chronic systolic CHF (congestive heart failure) ([place]) 09/29/2012  . Hypoxia 03/06/2012  . Hypertensive heart disease 03/06/2012  . Nonischemic cardiomyopathy ([place]) 03/06/2012  . Tobacco abuse 03/06/2012  . Type 2 diabetes mellitus ([place]) 03/06/2012  . Hypertension 03/06/2012  . CAD (coronary artery disease), native coronary artery 03/06/2012  . Acute on chronic combined systolic and diastolic heart failure ([place]) 03/06/2012  . Hypokalemia 03/06/2012  . Acute bronchitis 02/01/2009  . Sleep apnea 02/01/2009  . Chest pain 02/01/2009    Current Outpatient Prescriptions:  .  acetaminophen (TYLENOL) 500 MG tablet, Take 1 tablet (500 mg total) by mouth every 6 (six) hours as needed., Disp: 30 tablet, Rfl: 0 .  albuterol (PROVENTIL HFA;VENTOLIN HFA) 108 (90 Base) MCG/ACT inhaler, Inhale 2 puffs into the lungs every 6 (six) hours as needed for wheezing or shortness of breath., Disp: 18 g, Rfl: 0 .  apixaban (ELIQUIS) 5 MG TABS tablet, Take 1 tablet (5 mg total) by mouth 2 (two) times daily., Disp: 60 tablet, Rfl: 3 .  atorvastatin (LIPITOR) 40 MG tablet, Take 1 tablet (40 mg total) by mouth daily., Disp: 30 tablet, Rfl: 3 .  atorvastatin (LIPITOR) 40 MG tablet, TAKE 1 TABLET(40 MG) BY MOUTH DAILY, Disp: 30 tablet, Rfl: 3 .  carvedilol (COREG) 12.5 MG tablet,  Take 1 tablet (12.5 mg total) by mouth 2 (two) times daily with a meal., Disp: 60 tablet, Rfl: 3 .  ELIQUIS 5 MG TABS tablet, TAKE 1 TABLET(5 MG) BY MOUTH TWICE DAILY, Disp: 60 tablet, Rfl: 3 .  furosemide (LASIX) 40 MG tablet, Take 1 tablet (40 mg total) by mouth 2 (two) times daily., Disp: 60 tablet, Rfl: 3 .  glucose blood (FREESTYLE TEST STRIPS) test strip, Use as instructed, Disp: 100 each, Rfl: 0 .  glucose monitoring kit (FREESTYLE) monitoring kit, 1 each by Does not apply route 4 (four) times daily - after meals and at bedtime. 1 month Diabetic Testing Supplies for QAC-QHS accuchecks., Disp: 1 each, Rfl: 1 .  insulin aspart (NOVOLOG FLEXPEN) 100 UNIT/ML FlexPen, 0-20 Units, Subcutaneous, 3 times daily with meals CBG < 70: implement hypoglycemia protocol CBG 70 - 120: 0 units CBG 121 - 150: 3 units CBG 151 - 200: 4 units CBG 201 - 250: 7 units CBG 251 - 300: 11 units CBG 301 - 350: 15 units CBG 351 - 400: 20 units CBG > 400: call MD, Disp: 15 mL, Rfl: 0 .  Insulin Glargine (LANTUS SOLOSTAR) 100 UNIT/ML Solostar Pen, Inject 50 Units into the skin daily at 10 pm., Disp: , Rfl:  .  Insulin Pen Needle 32G X 8 MM MISC, Use as directed, Disp: 100 each, Rfl: 0 .  isosorbide-hydrALAZINE (BIDIL) 20-37.5 MG tablet, Take 2 tablets by mouth 3 (three) times daily., Disp: 180 tablet, Rfl: 6 .  Lancets (FREESTYLE) lancets, Use as instructed, Disp: 100  each, Rfl: 0 .  metFORMIN (GLUCOPHAGE) 1000 MG tablet, TAKE 1 TABLET(1000 MG) BY MOUTH TWICE DAILY WITH A MEAL, Disp: 60 tablet, Rfl: 0 .  oxyCODONE-acetaminophen (PERCOCET/ROXICET) 5-325 MG tablet, Take 1 tablet by mouth every 6 (six) hours as needed for severe pain., Disp: 8 tablet, Rfl: 0 .  potassium chloride 20 MEQ TBCR, Take 40 mEq by mouth daily., Disp: 60 tablet, Rfl: 3 .  sacubitril-valsartan (ENTRESTO) 97-103 MG, Take 1 tablet by mouth 2 (two) times daily., Disp: 60 tablet, Rfl: 6 .  spironolactone (ALDACTONE) 25 MG tablet, Take 1 tablet (25 mg total)  by mouth daily., Disp: 30 tablet, Rfl: 3 .  spironolactone (ALDACTONE) 25 MG tablet, TAKE 1 TABLET(25 MG) BY MOUTH DAILY, Disp: 30 tablet, Rfl: 3 Allergies  Allergen Reactions  . Morphine And Related Nausea And Vomiting and Other (See Comments)    Severe nausea     Social History   Social History  . Marital status: Single    Spouse name: N/A  . Number of children: N/A  . Years of education: N/A   Occupational History  . Not on file.   Social History Main Topics  . Smoking status: Former Smoker    Packs/day: 1.00    Years: 40.00    Types: Cigarettes    Start date: 06/23/1972    Quit date: 03/26/2012  . Smokeless tobacco: Never Used  . Alcohol use No  . Drug use: No  . Sexual activity: Yes   Other Topics Concern  . Not on file   Social History Narrative  . No narrative on file    Physical Exam      Future Appointments Date Time Provider [place]  10/13/2016 10:30 AM [facility] None  12/09/2016 9:20 AM Larey Dresser, MD [facility] None    ATF pt CAO x4 sitting on the edge of her bed with no complaints.  during our visit Tuesday she had an increase of SOB.  Junie Panning advised that pt is to increase lasix by 43m bid and potassium for the next three days.  Pt was unavailable until today for a pill box revision.  During our visit, pt scheduled lab appointment for Monday @ 10.  pts pill box revised.   BP 124/60   Pulse 96   Resp (!) 96   SpO2 98%    Trenace Coughlin, EMT Paramedic 10/09/2016    ACTION: Home visit completed

## 2016-10-10 ENCOUNTER — Other Ambulatory Visit (HOSPITAL_COMMUNITY): Payer: Medicare Other

## 2016-10-13 ENCOUNTER — Inpatient Hospital Stay (HOSPITAL_COMMUNITY): Admission: RE | Admit: 2016-10-13 | Payer: Medicare Other | Source: Ambulatory Visit

## 2016-10-14 ENCOUNTER — Ambulatory Visit (HOSPITAL_COMMUNITY)
Admission: RE | Admit: 2016-10-14 | Discharge: 2016-10-14 | Disposition: A | Discharge status: Home / Self Care | Payer: Medicare Other | Source: Ambulatory Visit | Attending: Cardiology | Admitting: Cardiology

## 2016-10-14 DIAGNOSIS — I5022 Chronic systolic (congestive) heart failure: Secondary | ICD-10-CM | POA: Diagnosis not present

## 2016-10-14 LAB — BASIC METABOLIC PANEL
Anion gap: 6 (ref 5–15)
BUN: 18 mg/dL (ref 6–20)
CO2: 30 mmol/L (ref 22–32)
Calcium: 8.9 mg/dL (ref 8.9–10.3)
Chloride: 101 mmol/L (ref 101–111)
Creatinine, Ser: 1.07 mg/dL — ABNORMAL HIGH (ref 0.44–1.00)
GFR calc Af Amer: >60 mL/min (ref >60)
GFR calc non Af Amer: 54 mL/min — ABNORMAL LOW (ref >60)
Glucose, Bld: 291 mg/dL — ABNORMAL HIGH (ref 65–99)
Potassium: 3.7 mmol/L (ref 3.5–5.1)
Sodium: 137 mmol/L (ref 135–145)

## 2016-10-17 ENCOUNTER — Other Ambulatory Visit (HOSPITAL_COMMUNITY): Payer: Self-pay

## 2016-10-17 ENCOUNTER — Other Ambulatory Visit (HOSPITAL_COMMUNITY): Payer: Self-pay | Admitting: Internal Medicine

## 2016-10-17 NOTE — Progress Notes (Signed)
Paramedicine Encounter    Patient ID: [name], female    DOB: [birth year], 66 y.o.   MRN: [id number]    Patient Care Team: [name], MD as PCP - General (Family Medicine)  Patient Active Problem List   Diagnosis Date Noted  . Paroxysmal A-fib ([place])   . Biventricular automatic implantable cardioverter defibrillator in situ   . Sepsis ([place]) 04/20/2016  . UTI (urinary tract infection) 04/20/2016  . Dyspnea 07/19/2015  . Interstitial lung disease ([place]) 11/20/2014  . SIRS (systemic inflammatory response syndrome) ([place]) 02/03/2014  . IDDM (insulin dependent diabetes mellitus) ([place]) 02/03/2014  . Tachycardia 06/14/2013  . Chronic systolic CHF (congestive heart failure) ([place]) 09/29/2012  . Hypoxia 03/06/2012  . Hypertensive heart disease 03/06/2012  . Nonischemic cardiomyopathy ([place]) 03/06/2012  . Tobacco abuse 03/06/2012  . Type 2 diabetes mellitus ([place]) 03/06/2012  . Hypertension 03/06/2012  . CAD (coronary artery disease), native coronary artery 03/06/2012  . Acute on chronic combined systolic and diastolic heart failure ([place]) 03/06/2012  . Hypokalemia 03/06/2012  . Acute bronchitis 02/01/2009  . Sleep apnea 02/01/2009  . Chest pain 02/01/2009    Current Outpatient Prescriptions:  .  apixaban (ELIQUIS) 5 MG TABS tablet, Take 1 tablet (5 mg total) by mouth 2 (two) times daily., Disp: 60 tablet, Rfl: 3 .  atorvastatin (LIPITOR) 40 MG tablet, Take 1 tablet (40 mg total) by mouth daily., Disp: 30 tablet, Rfl: 3 .  carvedilol (COREG) 12.5 MG tablet, Take 1 tablet (12.5 mg total) by mouth 2 (two) times daily with a meal., Disp: 60 tablet, Rfl: 3 .  ELIQUIS 5 MG TABS tablet, TAKE 1 TABLET(5 MG) BY MOUTH TWICE DAILY, Disp: 60 tablet, Rfl: 3 .  isosorbide-hydrALAZINE (BIDIL) 20-37.5 MG tablet, Take 2 tablets by mouth 3 (three) times daily., Disp: 180 tablet, Rfl: 6 .  metFORMIN (GLUCOPHAGE) 1000 MG tablet, TAKE 1 TABLET(1000 MG) BY MOUTH TWICE DAILY WITH A MEAL, Disp: 60  tablet, Rfl: 0 .  potassium chloride 20 MEQ TBCR, Take 40 mEq by mouth daily., Disp: 60 tablet, Rfl: 3 .  sacubitril-valsartan (ENTRESTO) 97-103 MG, Take 1 tablet by mouth 2 (two) times daily., Disp: 60 tablet, Rfl: 6 .  spironolactone (ALDACTONE) 25 MG tablet, Take 1 tablet (25 mg total) by mouth daily., Disp: 30 tablet, Rfl: 3 .  acetaminophen (TYLENOL) 500 MG tablet, Take 1 tablet (500 mg total) by mouth every 6 (six) hours as needed., Disp: 30 tablet, Rfl: 0 .  albuterol (PROVENTIL HFA;VENTOLIN HFA) 108 (90 Base) MCG/ACT inhaler, Inhale 2 puffs into the lungs every 6 (six) hours as needed for wheezing or shortness of breath., Disp: 18 g, Rfl: 0 .  atorvastatin (LIPITOR) 40 MG tablet, TAKE 1 TABLET(40 MG) BY MOUTH DAILY, Disp: 30 tablet, Rfl: 3 .  furosemide (LASIX) 40 MG tablet, TAKE 1 TABLET BY MOUTH TWICE DAILY, Disp: 60 tablet, Rfl: 0 .  glucose blood (FREESTYLE TEST STRIPS) test strip, Use as instructed, Disp: 100 each, Rfl: 0 .  glucose monitoring kit (FREESTYLE) monitoring kit, 1 each by Does not apply route 4 (four) times daily - after meals and at bedtime. 1 month Diabetic Testing Supplies for QAC-QHS accuchecks., Disp: 1 each, Rfl: 1 .  insulin aspart (NOVOLOG FLEXPEN) 100 UNIT/ML FlexPen, 0-20 Units, Subcutaneous, 3 times daily with meals CBG < 70: implement hypoglycemia protocol CBG 70 - 120: 0 units CBG 121 - 150: 3 units CBG 151 - 200: 4 units CBG 201 - 250: 7 units CBG 251 -  300: 11 units CBG 301 - 350: 15 units CBG 351 - 400: 20 units CBG > 400: call MD, Disp: 15 mL, Rfl: 0 .  Insulin Glargine (LANTUS SOLOSTAR) 100 UNIT/ML Solostar Pen, Inject 50 Units into the skin daily at 10 pm., Disp: , Rfl:  .  Insulin Pen Needle 32G X 8 MM MISC, Use as directed, Disp: 100 each, Rfl: 0 .  Lancets (FREESTYLE) lancets, Use as instructed, Disp: 100 each, Rfl: 0 .  oxyCODONE-acetaminophen (PERCOCET/ROXICET) 5-325 MG tablet, Take 1 tablet by mouth every 6 (six) hours as needed for severe pain., Disp:  8 tablet, Rfl: 0 .  spironolactone (ALDACTONE) 25 MG tablet, TAKE 1 TABLET(25 MG) BY MOUTH DAILY, Disp: 30 tablet, Rfl: 3 Allergies  Allergen Reactions  . Morphine And Related Nausea And Vomiting and Other (See Comments)    Severe nausea     Social History   Social History  . Marital status: Single    Spouse name: N/A  . Number of children: N/A  . Years of education: N/A   Occupational History  . Not on file.   Social History Main Topics  . Smoking status: Former Smoker    Packs/day: 1.00    Years: 40.00    Types: Cigarettes    Start date: 06/23/1972    Quit date: 03/26/2012  . Smokeless tobacco: Never Used  . Alcohol use No  . Drug use: No  . Sexual activity: Yes   Other Topics Concern  . Not on file   Social History Narrative  . No narrative on file    Physical Exam  Pulmonary/Chest: No respiratory distress. She has no wheezes. She has no rales.  Abdominal: She exhibits no distension. There is no tenderness. There is no guarding.  Musculoskeletal: She exhibits no edema.  Bruise and scar to left knee and left elbow  Skin: Skin is warm and dry. She is not diaphoretic.        Future Appointments Date Time Provider [place]  12/09/2016 9:20 AM Larey Dresser, MD [facility] None    ATF pt CAO x4 laying across the bed c/o left leg and arm n for the pain prior to my arrival.  Pt is very groogy during todays appointment.  Pt refilled her pill box prior to my arrival. She mixed up some of her meds but we went over them together.  Pt stated that she did not mss any meds this past week.  Pt denies sob, dizziness, and chest pain.  rx bottles verified and pill box corrected and filled.    Pts CBG elevated, she stated that she just ate and hasn't taken inusulin yet. Pt gave herself 20 units insulin prior to me leaving.  I advised her son that she had just taken insulin and that pt needed to watch her CBG throughout the day.  ** rx called  in: Atorvastatin fursemide  BP (!) 150/80   Pulse 93   Wt 217 lb 3.2 oz (98.5 kg)   SpO2 96%   BMI 34.02 kg/m  CBG 481  Weight yesterday-218.1 Last visit weight-231    Neel Buffone, EMT Paramedic 10/17/2016    ACTION: Home visit completed Next visit planned for next friday

## 2016-10-21 ENCOUNTER — Other Ambulatory Visit (HOSPITAL_COMMUNITY): Payer: Self-pay | Admitting: Cardiology

## 2016-10-21 MED ORDER — FUROSEMIDE 40 MG PO TABS: 40.0000 mg | ORAL_TABLET | Freq: Two times a day (BID) | ORAL | 3 refills | Status: DC

## 2016-10-24 ENCOUNTER — Other Ambulatory Visit (HOSPITAL_COMMUNITY): Payer: Self-pay

## 2016-10-24 NOTE — Progress Notes (Signed)
Paramedicine Encounter    Patient ID: [name], female    DOB: [birth year], 66 y.o.   MRN: [id number]    Patient Care Team: [name], MD as PCP - General (Family Medicine)  Patient Active Problem List   Diagnosis Date Noted  . Paroxysmal A-fib ([place])   . Biventricular automatic implantable cardioverter defibrillator in situ   . Sepsis ([place]) 04/20/2016  . UTI (urinary tract infection) 04/20/2016  . Dyspnea 07/19/2015  . Interstitial lung disease ([place]) 11/20/2014  . SIRS (systemic inflammatory response syndrome) ([place]) 02/03/2014  . IDDM (insulin dependent diabetes mellitus) ([place]) 02/03/2014  . Tachycardia 06/14/2013  . Chronic systolic CHF (congestive heart failure) ([place]) 09/29/2012  . Hypoxia 03/06/2012  . Hypertensive heart disease 03/06/2012  . Nonischemic cardiomyopathy ([place]) 03/06/2012  . Tobacco abuse 03/06/2012  . Type 2 diabetes mellitus ([place]) 03/06/2012  . Hypertension 03/06/2012  . CAD (coronary artery disease), native coronary artery 03/06/2012  . Acute on chronic combined systolic and diastolic heart failure ([place]) 03/06/2012  . Hypokalemia 03/06/2012  . Acute bronchitis 02/01/2009  . Sleep apnea 02/01/2009  . Chest pain 02/01/2009    Current Outpatient Prescriptions:  .  apixaban (ELIQUIS) 5 MG TABS tablet, Take 1 tablet (5 mg total) by mouth 2 (two) times daily., Disp: 60 tablet, Rfl: 3 .  atorvastatin (LIPITOR) 40 MG tablet, Take 1 tablet (40 mg total) by mouth daily., Disp: 30 tablet, Rfl: 3 .  carvedilol (COREG) 12.5 MG tablet, Take 1 tablet (12.5 mg total) by mouth 2 (two) times daily with a meal., Disp: 60 tablet, Rfl: 3 .  ELIQUIS 5 MG TABS tablet, TAKE 1 TABLET(5 MG) BY MOUTH TWICE DAILY, Disp: 60 tablet, Rfl: 3 .  furosemide (LASIX) 40 MG tablet, Take 1 tablet (40 mg total) by mouth 2 (two) times daily., Disp: 180 tablet, Rfl: 3 .  isosorbide-hydrALAZINE (BIDIL) 20-37.5 MG tablet, Take 2 tablets by mouth 3 (three) times daily., Disp: 180 tablet,  Rfl: 6 .  metFORMIN (GLUCOPHAGE) 1000 MG tablet, TAKE 1 TABLET(1000 MG) BY MOUTH TWICE DAILY WITH A MEAL, Disp: 60 tablet, Rfl: 0 .  potassium chloride 20 MEQ TBCR, Take 40 mEq by mouth daily., Disp: 60 tablet, Rfl: 3 .  sacubitril-valsartan (ENTRESTO) 97-103 MG, Take 1 tablet by mouth 2 (two) times daily., Disp: 60 tablet, Rfl: 6 .  spironolactone (ALDACTONE) 25 MG tablet, Take 1 tablet (25 mg total) by mouth daily., Disp: 30 tablet, Rfl: 3 .  acetaminophen (TYLENOL) 500 MG tablet, Take 1 tablet (500 mg total) by mouth every 6 (six) hours as needed., Disp: 30 tablet, Rfl: 0 .  albuterol (PROVENTIL HFA;VENTOLIN HFA) 108 (90 Base) MCG/ACT inhaler, Inhale 2 puffs into the lungs every 6 (six) hours as needed for wheezing or shortness of breath., Disp: 18 g, Rfl: 0 .  atorvastatin (LIPITOR) 40 MG tablet, TAKE 1 TABLET(40 MG) BY MOUTH DAILY, Disp: 30 tablet, Rfl: 3 .  glucose blood (FREESTYLE TEST STRIPS) test strip, Use as instructed, Disp: 100 each, Rfl: 0 .  glucose monitoring kit (FREESTYLE) monitoring kit, 1 each by Does not apply route 4 (four) times daily - after meals and at bedtime. 1 month Diabetic Testing Supplies for QAC-QHS accuchecks., Disp: 1 each, Rfl: 1 .  insulin aspart (NOVOLOG FLEXPEN) 100 UNIT/ML FlexPen, 0-20 Units, Subcutaneous, 3 times daily with meals CBG < 70: implement hypoglycemia protocol CBG 70 - 120: 0 units CBG 121 - 150: 3 units CBG 151 - 200: 4 units CBG 201 - 250:  7 units CBG 251 - 300: 11 units CBG 301 - 350: 15 units CBG 351 - 400: 20 units CBG > 400: call MD, Disp: 15 mL, Rfl: 0 .  Insulin Glargine (LANTUS SOLOSTAR) 100 UNIT/ML Solostar Pen, Inject 50 Units into the skin daily at 10 pm., Disp: , Rfl:  .  Insulin Pen Needle 32G X 8 MM MISC, Use as directed, Disp: 100 each, Rfl: 0 .  Lancets (FREESTYLE) lancets, Use as instructed, Disp: 100 each, Rfl: 0 .  oxyCODONE-acetaminophen (PERCOCET/ROXICET) 5-325 MG tablet, Take 1 tablet by mouth every 6 (six) hours as needed for  severe pain., Disp: 8 tablet, Rfl: 0 .  spironolactone (ALDACTONE) 25 MG tablet, TAKE 1 TABLET(25 MG) BY MOUTH DAILY, Disp: 30 tablet, Rfl: 3 Allergies  Allergen Reactions  . Morphine And Related Nausea And Vomiting and Other (See Comments)    Severe nausea     Social History   Social History  . Marital status: Single    Spouse name: N/A  . Number of children: N/A  . Years of education: N/A   Occupational History  . Not on file.   Social History Main Topics  . Smoking status: Former Smoker    Packs/day: 1.00    Years: 40.00    Types: Cigarettes    Start date: 06/23/1972    Quit date: 03/26/2012  . Smokeless tobacco: Never Used  . Alcohol use No  . Drug use: No  . Sexual activity: Yes   Other Topics Concern  . Not on file   Social History Narrative  . No narrative on file    Physical Exam  Pulmonary/Chest: No respiratory distress. She has no wheezes.  Abdominal: She exhibits no distension. There is no tenderness. There is no guarding.  Musculoskeletal: She exhibits no edema.  Skin: Skin is warm and dry. She is not diaphoretic.        Future Appointments Date Time Provider [place]  12/09/2016 9:20 AM Larey Dresser, MD [facility] None    ATF pt cao X4 sitting on the side of the bed folding clothes. Pt stated that she was working on her pill box prior to my arrival but she stopped.  Pt stated that she hadn't missed any doses besides today afternoon meds.  She took the afternoon meds during our visit.  She has been eating a lot of fruits like grapes today.  pts cbg noted, I will call her PCP due to this being the second week in a row of her CBG being elevated.  She took 20 units on insulin as I was walking out.  She denies sob, lightheadedness, and chest pain.  rx bottles verified and pill box refilled.   BP (!) 170/88   Pulse 100   Resp 16   Wt 202 lb 12.8 oz (92 kg)   SpO2 97%   BMI 31.76 kg/m   **rx called in: Lasix CBG 436  Weight  yesterday-didn't weigh Last visit weight-217    Jouri Threat, EMT Paramedic 10/24/2016    ACTION: Home visit completed

## 2016-10-31 ENCOUNTER — Other Ambulatory Visit (HOSPITAL_COMMUNITY): Payer: Self-pay

## 2016-10-31 NOTE — Progress Notes (Signed)
Paramedicine Encounter    Patient ID: JEANI FASSNACHT, female    DOB: [birth year], 66 y.o.   MRN: [id number]    Patient Care Team: [name], MD as PCP - General (Family Medicine)  Patient Active Problem List   Diagnosis Date Noted  . Paroxysmal A-fib ([place])   . Biventricular automatic implantable cardioverter defibrillator in situ   . Sepsis ([place]) 04/20/2016  . UTI (urinary tract infection) 04/20/2016  . Dyspnea 07/19/2015  . Interstitial lung disease ([place]) 11/20/2014  . SIRS (systemic inflammatory response syndrome) ([place]) 02/03/2014  . IDDM (insulin dependent diabetes mellitus) ([place]) 02/03/2014  . Tachycardia 06/14/2013  . Chronic systolic CHF (congestive heart failure) ([place]) 09/29/2012  . Hypoxia 03/06/2012  . Hypertensive heart disease 03/06/2012  . Nonischemic cardiomyopathy ([place]) 03/06/2012  . Tobacco abuse 03/06/2012  . Type 2 diabetes mellitus ([place]) 03/06/2012  . Hypertension 03/06/2012  . CAD (coronary artery disease), native coronary artery 03/06/2012  . Acute on chronic combined systolic and diastolic heart failure ([place]) 03/06/2012  . Hypokalemia 03/06/2012  . Acute bronchitis 02/01/2009  . Sleep apnea 02/01/2009  . Chest pain 02/01/2009    Current Outpatient Prescriptions:  .  apixaban (ELIQUIS) 5 MG TABS tablet, Take 1 tablet (5 mg total) by mouth 2 (two) times daily., Disp: 60 tablet, Rfl: 3 .  atorvastatin (LIPITOR) 40 MG tablet, Take 1 tablet (40 mg total) by mouth daily., Disp: 30 tablet, Rfl: 3 .  carvedilol (COREG) 12.5 MG tablet, Take 1 tablet (12.5 mg total) by mouth 2 (two) times daily with a meal., Disp: 60 tablet, Rfl: 3 .  ELIQUIS 5 MG TABS tablet, TAKE 1 TABLET(5 MG) BY MOUTH TWICE DAILY, Disp: 60 tablet, Rfl: 3 .  furosemide (LASIX) 40 MG tablet, Take 1 tablet (40 mg total) by mouth 2 (two) times daily., Disp: 180 tablet, Rfl: 3 .  isosorbide-hydrALAZINE (BIDIL) 20-37.5 MG tablet, Take 2 tablets by mouth 3 (three) times daily., Disp: 180 tablet,  Rfl: 6 .  metFORMIN (GLUCOPHAGE) 1000 MG tablet, TAKE 1 TABLET(1000 MG) BY MOUTH TWICE DAILY WITH A MEAL, Disp: 60 tablet, Rfl: 0 .  potassium chloride 20 MEQ TBCR, Take 40 mEq by mouth daily., Disp: 60 tablet, Rfl: 3 .  sacubitril-valsartan (ENTRESTO) 97-103 MG, Take 1 tablet by mouth 2 (two) times daily., Disp: 60 tablet, Rfl: 6 .  spironolactone (ALDACTONE) 25 MG tablet, Take 1 tablet (25 mg total) by mouth daily., Disp: 30 tablet, Rfl: 3 .  acetaminophen (TYLENOL) 500 MG tablet, Take 1 tablet (500 mg total) by mouth every 6 (six) hours as needed., Disp: 30 tablet, Rfl: 0 .  albuterol (PROVENTIL HFA;VENTOLIN HFA) 108 (90 Base) MCG/ACT inhaler, Inhale 2 puffs into the lungs every 6 (six) hours as needed for wheezing or shortness of breath., Disp: 18 g, Rfl: 0 .  atorvastatin (LIPITOR) 40 MG tablet, TAKE 1 TABLET(40 MG) BY MOUTH DAILY, Disp: 30 tablet, Rfl: 3 .  glucose blood (FREESTYLE TEST STRIPS) test strip, Use as instructed, Disp: 100 each, Rfl: 0 .  glucose monitoring kit (FREESTYLE) monitoring kit, 1 each by Does not apply route 4 (four) times daily - after meals and at bedtime. 1 month Diabetic Testing Supplies for QAC-QHS accuchecks., Disp: 1 each, Rfl: 1 .  insulin aspart (NOVOLOG FLEXPEN) 100 UNIT/ML FlexPen, 0-20 Units, Subcutaneous, 3 times daily with meals CBG < 70: implement hypoglycemia protocol CBG 70 - 120: 0 units CBG 121 - 150: 3 units CBG 151 - 200: 4 units CBG 201 - 250:  7 units CBG 251 - 300: 11 units CBG 301 - 350: 15 units CBG 351 - 400: 20 units CBG > 400: call MD, Disp: 15 mL, Rfl: 0 .  Insulin Glargine (LANTUS SOLOSTAR) 100 UNIT/ML Solostar Pen, Inject 50 Units into the skin daily at 10 pm., Disp: , Rfl:  .  Insulin Pen Needle 32G X 8 MM MISC, Use as directed, Disp: 100 each, Rfl: 0 .  Lancets (FREESTYLE) lancets, Use as instructed, Disp: 100 each, Rfl: 0 .  oxyCODONE-acetaminophen (PERCOCET/ROXICET) 5-325 MG tablet, Take 1 tablet by mouth every 6 (six) hours as needed for  severe pain., Disp: 8 tablet, Rfl: 0 .  spironolactone (ALDACTONE) 25 MG tablet, TAKE 1 TABLET(25 MG) BY MOUTH DAILY, Disp: 30 tablet, Rfl: 3 Allergies  Allergen Reactions  . Morphine And Related Nausea And Vomiting and Other (See Comments)    Severe nausea     Social History   Social History  . Marital status: Single    Spouse name: N/A  . Number of children: N/A  . Years of education: N/A   Occupational History  . Not on file.   Social History Main Topics  . Smoking status: Former Smoker    Packs/day: 1.00    Years: 40.00    Types: Cigarettes    Start date: 06/23/1972    Quit date: 03/26/2012  . Smokeless tobacco: Never Used  . Alcohol use No  . Drug use: No  . Sexual activity: Yes   Other Topics Concern  . Not on file   Social History Narrative  . No narrative on file    Physical Exam  Pulmonary/Chest: No respiratory distress.  Abdominal: She exhibits no distension. There is no tenderness. There is no guarding.  Musculoskeletal: She exhibits no edema.  Skin: Skin is warm and dry. She is not diaphoretic.        Future Appointments Date Time Provider [place]  12/09/2016 9:20 AM Larey Dresser, MD [facility] None    ATF pt CAO x4 sitting in her bedroom entertaining company.  Pt has missed all her evening meds and some of the afternoon.  She stated that she's falling asleep early and forgetting to take her meds. I advised her to set a alarm on her phone for a reminder.  Pt denies sob, dizziness, and chest pain.  She has been eating out a lot this week.  Her scale is broken therefore I need to bring her another one on our next visit. We talked about her diet and taking her meds.  rx bottles verified and pill box refilled.  She took 15 units insulin prior to me leaving, I advised her to recheck her cbg.  BP (!) 152/90   Pulse (!) 107   Resp 16   SpO2 98%  cbg [place], EMT Paramedic 11/01/2016    ACTION: Home visit  completed

## 2016-11-06 ENCOUNTER — Other Ambulatory Visit (HOSPITAL_COMMUNITY): Payer: Self-pay | Admitting: *Deleted

## 2016-11-06 MED ORDER — CARVEDILOL 12.5 MG PO TABS: 12.5000 mg | ORAL_TABLET | Freq: Two times a day (BID) | ORAL | 3 refills | Status: DC

## 2016-11-07 ENCOUNTER — Other Ambulatory Visit (HOSPITAL_COMMUNITY): Payer: Self-pay

## 2016-11-07 NOTE — Progress Notes (Signed)
Paramedicine Encounter    Patient ID: [name], female    DOB: Apr 28, 1950, 66 y.o.   MRN: [id number]    Patient Care Team: [name], MD as PCP - General (Family Medicine)  Patient Active Problem List   Diagnosis Date Noted  . Paroxysmal A-fib ([place])   . Biventricular automatic implantable cardioverter defibrillator in situ   . Sepsis ([place]) 04/20/2016  . UTI (urinary tract infection) 04/20/2016  . Dyspnea 07/19/2015  . Interstitial lung disease ([place]) 11/20/2014  . SIRS (systemic inflammatory response syndrome) ([place]) 02/03/2014  . IDDM (insulin dependent diabetes mellitus) ([place]) 02/03/2014  . Tachycardia 06/14/2013  . Chronic systolic CHF (congestive heart failure) ([place]) 09/29/2012  . Hypoxia 03/06/2012  . Hypertensive heart disease 03/06/2012  . Nonischemic cardiomyopathy ([place]) 03/06/2012  . Tobacco abuse 03/06/2012  . Type 2 diabetes mellitus ([place]) 03/06/2012  . Hypertension 03/06/2012  . CAD (coronary artery disease), native coronary artery 03/06/2012  . Acute on chronic combined systolic and diastolic heart failure ([place]) 03/06/2012  . Hypokalemia 03/06/2012  . Acute bronchitis 02/01/2009  . Sleep apnea 02/01/2009  . Chest pain 02/01/2009    Current Outpatient Prescriptions:  .  apixaban (ELIQUIS) 5 MG TABS tablet, Take 1 tablet (5 mg total) by mouth 2 (two) times daily., Disp: 60 tablet, Rfl: 3 .  carvedilol (COREG) 12.5 MG tablet, Take 1 tablet (12.5 mg total) by mouth 2 (two) times daily with a meal., Disp: 180 tablet, Rfl: 3 .  ELIQUIS 5 MG TABS tablet, TAKE 1 TABLET(5 MG) BY MOUTH TWICE DAILY, Disp: 60 tablet, Rfl: 3 .  furosemide (LASIX) 40 MG tablet, Take 1 tablet (40 mg total) by mouth 2 (two) times daily., Disp: 180 tablet, Rfl: 3 .  insulin aspart (NOVOLOG FLEXPEN) 100 UNIT/ML FlexPen, 0-20 Units, Subcutaneous, 3 times daily with meals CBG < 70: implement hypoglycemia protocol CBG 70 - 120: 0 units CBG 121 - 150: 3 units CBG 151 - 200: 4 units CBG 201 -  250: 7 units CBG 251 - 300: 11 units CBG 301 - 350: 15 units CBG 351 - 400: 20 units CBG > 400: call MD, Disp: 15 mL, Rfl: 0 .  Insulin Glargine (LANTUS SOLOSTAR) 100 UNIT/ML Solostar Pen, Inject 50 Units into the skin daily at 10 pm., Disp: , Rfl:  .  Insulin Pen Needle 32G X 8 MM MISC, Use as directed, Disp: 100 each, Rfl: 0 .  isosorbide-hydrALAZINE (BIDIL) 20-37.5 MG tablet, Take 2 tablets by mouth 3 (three) times daily., Disp: 180 tablet, Rfl: 6 .  metFORMIN (GLUCOPHAGE) 1000 MG tablet, TAKE 1 TABLET(1000 MG) BY MOUTH TWICE DAILY WITH A MEAL, Disp: 60 tablet, Rfl: 0 .  potassium chloride 20 MEQ TBCR, Take 40 mEq by mouth daily., Disp: 60 tablet, Rfl: 3 .  sacubitril-valsartan (ENTRESTO) 97-103 MG, Take 1 tablet by mouth 2 (two) times daily., Disp: 60 tablet, Rfl: 6 .  spironolactone (ALDACTONE) 25 MG tablet, Take 1 tablet (25 mg total) by mouth daily., Disp: 30 tablet, Rfl: 3 .  acetaminophen (TYLENOL) 500 MG tablet, Take 1 tablet (500 mg total) by mouth every 6 (six) hours as needed., Disp: 30 tablet, Rfl: 0 .  albuterol (PROVENTIL HFA;VENTOLIN HFA) 108 (90 Base) MCG/ACT inhaler, Inhale 2 puffs into the lungs every 6 (six) hours as needed for wheezing or shortness of breath., Disp: 18 g, Rfl: 0 .  atorvastatin (LIPITOR) 40 MG tablet, Take 1 tablet (40 mg total) by mouth daily., Disp: 30 tablet, Rfl: 3 .  atorvastatin (LIPITOR) 40 MG tablet, TAKE 1 TABLET(40 MG) BY MOUTH DAILY, Disp: 30 tablet, Rfl: 3 .  glucose blood (FREESTYLE TEST STRIPS) test strip, Use as instructed, Disp: 100 each, Rfl: 0 .  glucose monitoring kit (FREESTYLE) monitoring kit, 1 each by Does not apply route 4 (four) times daily - after meals and at bedtime. 1 month Diabetic Testing Supplies for QAC-QHS accuchecks., Disp: 1 each, Rfl: 1 .  Lancets (FREESTYLE) lancets, Use as instructed, Disp: 100 each, Rfl: 0 .  oxyCODONE-acetaminophen (PERCOCET/ROXICET) 5-325 MG tablet, Take 1 tablet by mouth every 6 (six) hours as needed for  severe pain., Disp: 8 tablet, Rfl: 0 .  spironolactone (ALDACTONE) 25 MG tablet, TAKE 1 TABLET(25 MG) BY MOUTH DAILY, Disp: 30 tablet, Rfl: 3 Allergies  Allergen Reactions  . Morphine And Related Nausea And Vomiting and Other (See Comments)    Severe nausea     Social History   Social History  . Marital status: Single    Spouse name: N/A  . Number of children: N/A  . Years of education: N/A   Occupational History  . Not on file.   Social History Main Topics  . Smoking status: Former Smoker    Packs/day: 1.00    Years: 40.00    Types: Cigarettes    Start date: 06/23/1972    Quit date: 03/26/2012  . Smokeless tobacco: Never Used  . Alcohol use No  . Drug use: No  . Sexual activity: Yes   Other Topics Concern  . Not on file   Social History Narrative  . No narrative on file    Physical Exam  Pulmonary/Chest: No respiratory distress.  Abdominal: She exhibits no distension. There is no tenderness. There is no guarding.  Musculoskeletal: She exhibits no edema.  Skin: Skin is warm and dry. She is not diaphoretic.        Future Appointments Date Time Provider [place]  12/09/2016 9:20 AM Larey Dresser, MD [facility] None    ATF pt CAO x4 sitting on the porch talking with some friends.  Pt has missed all of the evening and afternoon meds for the past week.  Pt stated that she "just missed taking them" followed by " I feel fine tho".  We discussed her taking and missing her meds and the importance of her taking her meds as prescribed.  She stated that she will try to get back on schedule.  Pt denies sob, dizziness, and chest pain.  Pt's vitals noted.  rx bottles verified and pill box refilled. Pt's weight is more consistent with her previous weights before the last reading.  I think that pt may had weighed in the part of the kitchen where the floor was unable.  I will continue to watch her while she's weighing.   F/U Tuesday to make sure she put the lasix in the  right slots  ** pt is out of: Lasix  atorvastatin BP (!) 162/90   Pulse 96   Resp 16   Wt 227 lb 12.8 oz (103.3 kg)   SpO2 98%   BMI 35.68 kg/m  cbg 310 (pt plan on taking 15 units of insulin)     Kataleah Bejar, EMT Paramedic 11/07/2016    ACTION: Home visit completed Next visit planned for f/u tues

## 2016-11-14 ENCOUNTER — Other Ambulatory Visit (HOSPITAL_COMMUNITY): Payer: Self-pay

## 2016-11-14 NOTE — Progress Notes (Signed)
Paramedicine Encounter    Patient ID: [name], female    DOB: 09-07-1950, 66 y.o.   MRN: [id number]    Patient Care Team: [name], MD as PCP - General (Family Medicine)  Patient Active Problem List   Diagnosis Date Noted  . Paroxysmal A-fib ([place])   . Biventricular automatic implantable cardioverter defibrillator in situ   . Sepsis ([place]) 04/20/2016  . UTI (urinary tract infection) 04/20/2016  . Dyspnea 07/19/2015  . Interstitial lung disease ([place]) 11/20/2014  . SIRS (systemic inflammatory response syndrome) ([place]) 02/03/2014  . IDDM (insulin dependent diabetes mellitus) ([place]) 02/03/2014  . Tachycardia 06/14/2013  . Chronic systolic CHF (congestive heart failure) ([place]) 09/29/2012  . Hypoxia 03/06/2012  . Hypertensive heart disease 03/06/2012  . Nonischemic cardiomyopathy ([place]) 03/06/2012  . Tobacco abuse 03/06/2012  . Type 2 diabetes mellitus ([place]) 03/06/2012  . Hypertension 03/06/2012  . CAD (coronary artery disease), native coronary artery 03/06/2012  . Acute on chronic combined systolic and diastolic heart failure ([place]) 03/06/2012  . Hypokalemia 03/06/2012  . Acute bronchitis 02/01/2009  . Sleep apnea 02/01/2009  . Chest pain 02/01/2009    Current Outpatient Prescriptions:  .  apixaban (ELIQUIS) 5 MG TABS tablet, Take 1 tablet (5 mg total) by mouth 2 (two) times daily., Disp: 60 tablet, Rfl: 3 .  atorvastatin (LIPITOR) 40 MG tablet, Take 1 tablet (40 mg total) by mouth daily., Disp: 30 tablet, Rfl: 3 .  carvedilol (COREG) 12.5 MG tablet, Take 1 tablet (12.5 mg total) by mouth 2 (two) times daily with a meal., Disp: 180 tablet, Rfl: 3 .  furosemide (LASIX) 40 MG tablet, Take 1 tablet (40 mg total) by mouth 2 (two) times daily., Disp: 180 tablet, Rfl: 3 .  isosorbide-hydrALAZINE (BIDIL) 20-37.5 MG tablet, Take 2 tablets by mouth 3 (three) times daily., Disp: 180 tablet, Rfl: 6 .  metFORMIN (GLUCOPHAGE) 1000 MG tablet, TAKE 1 TABLET(1000 MG) BY MOUTH TWICE DAILY  WITH A MEAL, Disp: 60 tablet, Rfl: 0 .  potassium chloride 20 MEQ TBCR, Take 40 mEq by mouth daily., Disp: 60 tablet, Rfl: 3 .  sacubitril-valsartan (ENTRESTO) 97-103 MG, Take 1 tablet by mouth 2 (two) times daily., Disp: 60 tablet, Rfl: 6 .  spironolactone (ALDACTONE) 25 MG tablet, Take 1 tablet (25 mg total) by mouth daily., Disp: 30 tablet, Rfl: 3 .  acetaminophen (TYLENOL) 500 MG tablet, Take 1 tablet (500 mg total) by mouth every 6 (six) hours as needed., Disp: 30 tablet, Rfl: 0 .  albuterol (PROVENTIL HFA;VENTOLIN HFA) 108 (90 Base) MCG/ACT inhaler, Inhale 2 puffs into the lungs every 6 (six) hours as needed for wheezing or shortness of breath., Disp: 18 g, Rfl: 0 .  atorvastatin (LIPITOR) 40 MG tablet, TAKE 1 TABLET(40 MG) BY MOUTH DAILY, Disp: 30 tablet, Rfl: 3 .  ELIQUIS 5 MG TABS tablet, TAKE 1 TABLET(5 MG) BY MOUTH TWICE DAILY, Disp: 60 tablet, Rfl: 3 .  glucose blood (FREESTYLE TEST STRIPS) test strip, Use as instructed, Disp: 100 each, Rfl: 0 .  glucose monitoring kit (FREESTYLE) monitoring kit, 1 each by Does not apply route 4 (four) times daily - after meals and at bedtime. 1 month Diabetic Testing Supplies for QAC-QHS accuchecks., Disp: 1 each, Rfl: 1 .  insulin aspart (NOVOLOG FLEXPEN) 100 UNIT/ML FlexPen, 0-20 Units, Subcutaneous, 3 times daily with meals CBG < 70: implement hypoglycemia protocol CBG 70 - 120: 0 units CBG 121 - 150: 3 units CBG 151 - 200: 4 units CBG 201 - 250:  7 units CBG 251 - 300: 11 units CBG 301 - 350: 15 units CBG 351 - 400: 20 units CBG > 400: call MD, Disp: 15 mL, Rfl: 0 .  Insulin Glargine (LANTUS SOLOSTAR) 100 UNIT/ML Solostar Pen, Inject 50 Units into the skin daily at 10 pm., Disp: , Rfl:  .  Insulin Pen Needle 32G X 8 MM MISC, Use as directed, Disp: 100 each, Rfl: 0 .  Lancets (FREESTYLE) lancets, Use as instructed, Disp: 100 each, Rfl: 0 .  oxyCODONE-acetaminophen (PERCOCET/ROXICET) 5-325 MG tablet, Take 1 tablet by mouth every 6 (six) hours as needed  for severe pain., Disp: 8 tablet, Rfl: 0 .  spironolactone (ALDACTONE) 25 MG tablet, TAKE 1 TABLET(25 MG) BY MOUTH DAILY, Disp: 30 tablet, Rfl: 3 Allergies  Allergen Reactions  . Morphine And Related Nausea And Vomiting and Other (See Comments)    Severe nausea     Social History   Social History  . Marital status: Single    Spouse name: N/A  . Number of children: N/A  . Years of education: N/A   Occupational History  . Not on file.   Social History Main Topics  . Smoking status: Former Smoker    Packs/day: 1.00    Years: 40.00    Types: Cigarettes    Start date: 06/23/1972    Quit date: 03/26/2012  . Smokeless tobacco: Never Used  . Alcohol use No  . Drug use: No  . Sexual activity: Yes   Other Topics Concern  . Not on file   Social History Narrative  . No narrative on file    Physical Exam  Pulmonary/Chest: No respiratory distress. She has no wheezes.  Abdominal: She exhibits no distension.  Musculoskeletal: She exhibits no edema.  Skin: Skin is warm and dry. She is not diaphoretic.        Future Appointments Date Time Provider [place]  12/09/2016 9:20 AM Larey Dresser, MD [facility] None    ATF pt CAO x4; she's just coming home from the grocery store.  She denies sob, dizziness and chest pain. Pt has taken her meds for this am and missed one evening dose.  Pt is still eating foods high in sodium and we discussed her diet.  rx bottles verified and pill box refilled.   BP 118/72   Pulse 94   Resp 16   Wt 224 lb (101.6 kg)   SpO2 98%   BMI 35.08 kg/m   Weight yesterday-didn't weigh Last visit weight-227    Ghalia Reicks, EMT Paramedic 11/14/2016    ACTION: Home visit completed

## 2016-11-21 ENCOUNTER — Other Ambulatory Visit (HOSPITAL_COMMUNITY): Payer: Self-pay

## 2016-11-21 NOTE — Progress Notes (Signed)
Paramedicine Encounter    Patient ID: [name], female    DOB: 1950-02-15, 66 y.o.   MRN: [id number]    Patient Care Team: [name], MD as PCP - General (Family Medicine)  Patient Active Problem List   Diagnosis Date Noted  . Paroxysmal A-fib ([place])   . Biventricular automatic implantable cardioverter defibrillator in situ   . Sepsis ([place]) 04/20/2016  . UTI (urinary tract infection) 04/20/2016  . Dyspnea 07/19/2015  . Interstitial lung disease ([place]) 11/20/2014  . SIRS (systemic inflammatory response syndrome) ([place]) 02/03/2014  . IDDM (insulin dependent diabetes mellitus) ([place]) 02/03/2014  . Tachycardia 06/14/2013  . Chronic systolic CHF (congestive heart failure) ([place]) 09/29/2012  . Hypoxia 03/06/2012  . Hypertensive heart disease 03/06/2012  . Nonischemic cardiomyopathy ([place]) 03/06/2012  . Tobacco abuse 03/06/2012  . Type 2 diabetes mellitus ([place]) 03/06/2012  . Hypertension 03/06/2012  . CAD (coronary artery disease), native coronary artery 03/06/2012  . Acute on chronic combined systolic and diastolic heart failure ([place]) 03/06/2012  . Hypokalemia 03/06/2012  . Acute bronchitis 02/01/2009  . Sleep apnea 02/01/2009  . Chest pain 02/01/2009    Current Outpatient Prescriptions:  .  apixaban (ELIQUIS) 5 MG TABS tablet, Take 1 tablet (5 mg total) by mouth 2 (two) times daily., Disp: 60 tablet, Rfl: 3 .  atorvastatin (LIPITOR) 40 MG tablet, Take 1 tablet (40 mg total) by mouth daily., Disp: 30 tablet, Rfl: 3 .  ELIQUIS 5 MG TABS tablet, TAKE 1 TABLET(5 MG) BY MOUTH TWICE DAILY, Disp: 60 tablet, Rfl: 3 .  furosemide (LASIX) 40 MG tablet, Take 1 tablet (40 mg total) by mouth 2 (two) times daily., Disp: 180 tablet, Rfl: 3 .  glucose monitoring kit (FREESTYLE) monitoring kit, 1 each by Does not apply route 4 (four) times daily - after meals and at bedtime. 1 month Diabetic Testing Supplies for QAC-QHS accuchecks., Disp: 1 each, Rfl: 1 .  insulin aspart (NOVOLOG FLEXPEN)  100 UNIT/ML FlexPen, 0-20 Units, Subcutaneous, 3 times daily with meals CBG < 70: implement hypoglycemia protocol CBG 70 - 120: 0 units CBG 121 - 150: 3 units CBG 151 - 200: 4 units CBG 201 - 250: 7 units CBG 251 - 300: 11 units CBG 301 - 350: 15 units CBG 351 - 400: 20 units CBG > 400: call MD, Disp: 15 mL, Rfl: 0 .  Insulin Pen Needle 32G X 8 MM MISC, Use as directed, Disp: 100 each, Rfl: 0 .  isosorbide-hydrALAZINE (BIDIL) 20-37.5 MG tablet, Take 2 tablets by mouth 3 (three) times daily., Disp: 180 tablet, Rfl: 6 .  Lancets (FREESTYLE) lancets, Use as instructed, Disp: 100 each, Rfl: 0 .  metFORMIN (GLUCOPHAGE) 1000 MG tablet, TAKE 1 TABLET(1000 MG) BY MOUTH TWICE DAILY WITH A MEAL, Disp: 60 tablet, Rfl: 0 .  oxyCODONE-acetaminophen (PERCOCET/ROXICET) 5-325 MG tablet, Take 1 tablet by mouth every 6 (six) hours as needed for severe pain., Disp: 8 tablet, Rfl: 0 .  potassium chloride 20 MEQ TBCR, Take 40 mEq by mouth daily., Disp: 60 tablet, Rfl: 3 .  sacubitril-valsartan (ENTRESTO) 97-103 MG, Take 1 tablet by mouth 2 (two) times daily., Disp: 60 tablet, Rfl: 6 .  spironolactone (ALDACTONE) 25 MG tablet, Take 1 tablet (25 mg total) by mouth daily., Disp: 30 tablet, Rfl: 3 .  spironolactone (ALDACTONE) 25 MG tablet, TAKE 1 TABLET(25 MG) BY MOUTH DAILY, Disp: 30 tablet, Rfl: 3 .  acetaminophen (TYLENOL) 500 MG tablet, Take 1 tablet (500 mg total) by mouth every 6 (  six) hours as needed., Disp: 30 tablet, Rfl: 0 .  albuterol (PROVENTIL HFA;VENTOLIN HFA) 108 (90 Base) MCG/ACT inhaler, Inhale 2 puffs into the lungs every 6 (six) hours as needed for wheezing or shortness of breath., Disp: 18 g, Rfl: 0 .  atorvastatin (LIPITOR) 40 MG tablet, TAKE 1 TABLET(40 MG) BY MOUTH DAILY, Disp: 30 tablet, Rfl: 3 .  carvedilol (COREG) 12.5 MG tablet, Take 1 tablet (12.5 mg total) by mouth 2 (two) times daily with a meal., Disp: 180 tablet, Rfl: 3 .  glucose blood (FREESTYLE TEST STRIPS) test strip, Use as instructed,  Disp: 100 each, Rfl: 0 .  Insulin Glargine (LANTUS SOLOSTAR) 100 UNIT/ML Solostar Pen, Inject 50 Units into the skin daily at 10 pm., Disp: , Rfl:  Allergies  Allergen Reactions  . Morphine And Related Nausea And Vomiting and Other (See Comments)    Severe nausea     Social History   Social History  . Marital status: Single    Spouse name: N/A  . Number of children: N/A  . Years of education: N/A   Occupational History  . Not on file.   Social History Main Topics  . Smoking status: Former Smoker    Packs/day: 1.00    Years: 40.00    Types: Cigarettes    Start date: 06/23/1972    Quit date: 03/26/2012  . Smokeless tobacco: Never Used  . Alcohol use No  . Drug use: No  . Sexual activity: Yes   Other Topics Concern  . Not on file   Social History Narrative  . No narrative on file    Physical Exam  Abdominal: She exhibits no distension. There is no tenderness.  Musculoskeletal: She exhibits no edema or tenderness.  Skin: Skin is warm and dry. She is not diaphoretic.        Future Appointments Date Time Provider [place]  12/09/2016 9:20 AM Larey Dresser, MD [facility] None    ATF pt CAO x4 juist coming home from running errands.  Pt stated that she feels good today and she just took her meds today.  She is still skipping some of her afternoon doses.  Pt stated that she leaves her pill box home some days, therefore she misses sometimes.  Pt denies sob, dizziness and chest pain.   Pt's weight has been inconsistent due to her placing the scale in different areas of her kitchen. Theres very little places without carpet in her house and both the bathroom/kitchen floors are uneven.    rx bottles verified and pill box refilled.   BP (!) 148/82   Pulse (!) 107   Resp 16   Wt 204 lb 3.2 oz (92.6 kg)   SpO2 98%   BMI 31.98 kg/m   Weight yesterday-didn't weigh Last visit weight-224    Kaya Pottenger, EMT Paramedic 11/21/2016    ACTION: Home visit  completed

## 2016-11-26 ENCOUNTER — Other Ambulatory Visit: Payer: Self-pay | Admitting: Gastroenterology

## 2016-11-28 ENCOUNTER — Other Ambulatory Visit (HOSPITAL_COMMUNITY): Payer: Self-pay

## 2016-11-28 NOTE — Progress Notes (Signed)
Paramedicine Encounter    Patient ID: [name], female    DOB: 1950-10-08, 66 y.o.   MRN: [id number]    Patient Care Team: [name], MD as PCP - General (Family Medicine)  Patient Active Problem List   Diagnosis Date Noted  . Paroxysmal A-fib ([place])   . Biventricular automatic implantable cardioverter defibrillator in situ   . Sepsis ([place]) 04/20/2016  . UTI (urinary tract infection) 04/20/2016  . Dyspnea 07/19/2015  . Interstitial lung disease ([place]) 11/20/2014  . SIRS (systemic inflammatory response syndrome) ([place]) 02/03/2014  . IDDM (insulin dependent diabetes mellitus) ([place]) 02/03/2014  . Tachycardia 06/14/2013  . Chronic systolic CHF (congestive heart failure) ([place]) 09/29/2012  . Hypoxia 03/06/2012  . Hypertensive heart disease 03/06/2012  . Nonischemic cardiomyopathy ([place]) 03/06/2012  . Tobacco abuse 03/06/2012  . Type 2 diabetes mellitus ([place]) 03/06/2012  . Hypertension 03/06/2012  . CAD (coronary artery disease), native coronary artery 03/06/2012  . Acute on chronic combined systolic and diastolic heart failure ([place]) 03/06/2012  . Hypokalemia 03/06/2012  . Acute bronchitis 02/01/2009  . Sleep apnea 02/01/2009  . Chest pain 02/01/2009    Current Outpatient Prescriptions:  .  apixaban (ELIQUIS) 5 MG TABS tablet, Take 1 tablet (5 mg total) by mouth 2 (two) times daily., Disp: 60 tablet, Rfl: 3 .  atorvastatin (LIPITOR) 40 MG tablet, Take 1 tablet (40 mg total) by mouth daily., Disp: 30 tablet, Rfl: 3 .  atorvastatin (LIPITOR) 40 MG tablet, TAKE 1 TABLET(40 MG) BY MOUTH DAILY, Disp: 30 tablet, Rfl: 3 .  carvedilol (COREG) 12.5 MG tablet, Take 1 tablet (12.5 mg total) by mouth 2 (two) times daily with a meal., Disp: 180 tablet, Rfl: 3 .  furosemide (LASIX) 40 MG tablet, Take 1 tablet (40 mg total) by mouth 2 (two) times daily., Disp: 180 tablet, Rfl: 3 .  Insulin Pen Needle 32G X 8 MM MISC, Use as directed, Disp: 100 each, Rfl: 0 .  isosorbide-hydrALAZINE  (BIDIL) 20-37.5 MG tablet, Take 2 tablets by mouth 3 (three) times daily., Disp: 180 tablet, Rfl: 6 .  Lancets (FREESTYLE) lancets, Use as instructed, Disp: 100 each, Rfl: 0 .  metFORMIN (GLUCOPHAGE) 1000 MG tablet, TAKE 1 TABLET(1000 MG) BY MOUTH TWICE DAILY WITH A MEAL, Disp: 60 tablet, Rfl: 0 .  potassium chloride 20 MEQ TBCR, Take 40 mEq by mouth daily., Disp: 60 tablet, Rfl: 3 .  sacubitril-valsartan (ENTRESTO) 97-103 MG, Take 1 tablet by mouth 2 (two) times daily., Disp: 60 tablet, Rfl: 6 .  spironolactone (ALDACTONE) 25 MG tablet, Take 1 tablet (25 mg total) by mouth daily., Disp: 30 tablet, Rfl: 3 .  spironolactone (ALDACTONE) 25 MG tablet, TAKE 1 TABLET(25 MG) BY MOUTH DAILY, Disp: 30 tablet, Rfl: 3 .  acetaminophen (TYLENOL) 500 MG tablet, Take 1 tablet (500 mg total) by mouth every 6 (six) hours as needed., Disp: 30 tablet, Rfl: 0 .  albuterol (PROVENTIL HFA;VENTOLIN HFA) 108 (90 Base) MCG/ACT inhaler, Inhale 2 puffs into the lungs every 6 (six) hours as needed for wheezing or shortness of breath., Disp: 18 g, Rfl: 0 .  ELIQUIS 5 MG TABS tablet, TAKE 1 TABLET(5 MG) BY MOUTH TWICE DAILY, Disp: 60 tablet, Rfl: 3 .  glucose blood (FREESTYLE TEST STRIPS) test strip, Use as instructed, Disp: 100 each, Rfl: 0 .  glucose monitoring kit (FREESTYLE) monitoring kit, 1 each by Does not apply route 4 (four) times daily - after meals and at bedtime. 1 month Diabetic Testing Supplies for QAC-QHS accuchecks.,  Disp: 1 each, Rfl: 1 .  insulin aspart (NOVOLOG FLEXPEN) 100 UNIT/ML FlexPen, 0-20 Units, Subcutaneous, 3 times daily with meals CBG < 70: implement hypoglycemia protocol CBG 70 - 120: 0 units CBG 121 - 150: 3 units CBG 151 - 200: 4 units CBG 201 - 250: 7 units CBG 251 - 300: 11 units CBG 301 - 350: 15 units CBG 351 - 400: 20 units CBG > 400: call MD, Disp: 15 mL, Rfl: 0 .  Insulin Glargine (LANTUS SOLOSTAR) 100 UNIT/ML Solostar Pen, Inject 50 Units into the skin daily at 10 pm., Disp: , Rfl:  .   oxyCODONE-acetaminophen (PERCOCET/ROXICET) 5-325 MG tablet, Take 1 tablet by mouth every 6 (six) hours as needed for severe pain., Disp: 8 tablet, Rfl: 0 Allergies  Allergen Reactions  . Morphine And Related Nausea And Vomiting and Other (See Comments)    Severe nausea     Social History   Social History  . Marital status: Single    Spouse name: N/A  . Number of children: N/A  . Years of education: N/A   Occupational History  . Not on file.   Social History Main Topics  . Smoking status: Former Smoker    Packs/day: 1.00    Years: 40.00    Types: Cigarettes    Start date: 06/23/1972    Quit date: 03/26/2012  . Smokeless tobacco: Never Used  . Alcohol use No  . Drug use: No  . Sexual activity: Yes   Other Topics Concern  . Not on file   Social History Narrative  . No narrative on file    Physical Exam  Constitutional: She appears well-developed.  Pulmonary/Chest: No respiratory distress.  Abdominal: She exhibits no distension. There is no tenderness. There is no guarding.  Musculoskeletal: She exhibits no edema.  Skin: Skin is warm and dry. She is not diaphoretic.        Future Appointments Date Time Provider [place]  12/09/2016 9:20 AM Larey Dresser, MD [facility] None    ATF pt CAO x4 sitting on her bed with no complaints. Pt stated that she has been cooking all day. Pt had [place] this afternoon for lunch.  Pt's evening medications slot were full. Pt stated that she had refilled the evening prior to me coming today, but she hadn't refilled the rest of the box.  Pt denies sob, dizziness and chest pain.  Pt's weight appears to be a lot higher than last week.  I called [facility] and talked to jazmine about the same.  I reported to Jazmine that pt may not be taking her meds like she's reporting to me.  Pt's CBG Is also elevated.  I called and left a message with Dr. Alyson Ingles office.    Jazmine called pt and stated that pt has an appointment set up  for next week.  Pt took 50 units of insulin when I was walking out of the door. She stated that she usually takes as much when her CBG is high "like this".  **rx called in: eliquis  BP (!) 170/98   Pulse (!) 104   Wt 227 lb (103 kg)   BMI 35.55 kg/m  cbg 500 50units insulin  Weight yesterday-didn't weigh Last visit weight-204 (pt's house has a lot of uneven floors, [facility] advised of the same)    Brenee Gajda, EMT Paramedic 11/28/2016    ACTION: Home visit completed

## 2016-11-30 ENCOUNTER — Emergency Department (HOSPITAL_COMMUNITY)
Admission: EM | Admit: 2016-11-30 | Discharge: 2016-11-30 | Disposition: A | Discharge status: Home / Self Care | Payer: Medicare Other | Attending: Physician Assistant | Admitting: Physician Assistant

## 2016-11-30 ENCOUNTER — Encounter (HOSPITAL_COMMUNITY): Payer: Self-pay | Admitting: Emergency Medicine

## 2016-11-30 ENCOUNTER — Emergency Department (HOSPITAL_COMMUNITY): Payer: Medicare Other

## 2016-11-30 ENCOUNTER — Other Ambulatory Visit: Payer: Self-pay

## 2016-11-30 DIAGNOSIS — E1165 Type 2 diabetes mellitus with hyperglycemia: Secondary | ICD-10-CM | POA: Diagnosis not present

## 2016-11-30 DIAGNOSIS — R079 Chest pain, unspecified: Secondary | ICD-10-CM | POA: Diagnosis not present

## 2016-11-30 DIAGNOSIS — Z7901 Long term (current) use of anticoagulants: Secondary | ICD-10-CM | POA: Diagnosis not present

## 2016-11-30 DIAGNOSIS — R739 Hyperglycemia, unspecified: Secondary | ICD-10-CM

## 2016-11-30 DIAGNOSIS — Z96641 Presence of right artificial hip joint: Secondary | ICD-10-CM | POA: Insufficient documentation

## 2016-11-30 DIAGNOSIS — I2510 Atherosclerotic heart disease of native coronary artery without angina pectoris: Secondary | ICD-10-CM | POA: Insufficient documentation

## 2016-11-30 DIAGNOSIS — Z79899 Other long term (current) drug therapy: Secondary | ICD-10-CM | POA: Insufficient documentation

## 2016-11-30 DIAGNOSIS — Z96642 Presence of left artificial hip joint: Secondary | ICD-10-CM | POA: Diagnosis not present

## 2016-11-30 DIAGNOSIS — I5042 Chronic combined systolic (congestive) and diastolic (congestive) heart failure: Secondary | ICD-10-CM | POA: Insufficient documentation

## 2016-11-30 DIAGNOSIS — Z791 Long term (current) use of non-steroidal anti-inflammatories (NSAID): Secondary | ICD-10-CM | POA: Insufficient documentation

## 2016-11-30 DIAGNOSIS — Z794 Long term (current) use of insulin: Secondary | ICD-10-CM | POA: Insufficient documentation

## 2016-11-30 DIAGNOSIS — Z95810 Presence of automatic (implantable) cardiac defibrillator: Secondary | ICD-10-CM | POA: Diagnosis not present

## 2016-11-30 DIAGNOSIS — I110 Hypertensive heart disease with heart failure: Secondary | ICD-10-CM | POA: Insufficient documentation

## 2016-11-30 DIAGNOSIS — Z96651 Presence of right artificial knee joint: Secondary | ICD-10-CM | POA: Diagnosis not present

## 2016-11-30 DIAGNOSIS — Z87891 Personal history of nicotine dependence: Secondary | ICD-10-CM | POA: Diagnosis not present

## 2016-11-30 DIAGNOSIS — R002 Palpitations: Secondary | ICD-10-CM | POA: Diagnosis present

## 2016-11-30 LAB — BASIC METABOLIC PANEL
Anion gap: 10 (ref 5–15)
BUN: 31 mg/dL — ABNORMAL HIGH (ref 6–20)
CO2: 27 mmol/L (ref 22–32)
Calcium: 9.5 mg/dL (ref 8.9–10.3)
Chloride: 99 mmol/L — ABNORMAL LOW (ref 101–111)
Creatinine, Ser: 1.56 mg/dL — ABNORMAL HIGH (ref 0.44–1.00)
GFR calc Af Amer: 39 mL/min — ABNORMAL LOW (ref >60)
GFR calc non Af Amer: 34 mL/min — ABNORMAL LOW (ref >60)
Glucose, Bld: 414 mg/dL — ABNORMAL HIGH (ref 65–99)
Potassium: 5 mmol/L (ref 3.5–5.1)
Sodium: 136 mmol/L (ref 135–145)

## 2016-11-30 LAB — CBC
HCT: 34.2 % — ABNORMAL LOW (ref 36.0–46.0)
Hemoglobin: 11.6 g/dL — ABNORMAL LOW (ref 12.0–15.0)
MCH: 30.5 pg (ref 26.0–34.0)
MCHC: 33.9 g/dL (ref 30.0–36.0)
MCV: 90 fL (ref 78.0–100.0)
Platelets: 358 10*3/uL (ref 150–400)
RBC: 3.8 MIL/uL — ABNORMAL LOW (ref 3.87–5.11)
RDW: 12.3 % (ref 11.5–15.5)
WBC: 8.6 10*3/uL (ref 4.0–10.5)

## 2016-11-30 LAB — I-STAT TROPONIN, ED
Troponin i, poc: 0 ng/mL (ref 0.00–0.08)
Troponin i, poc: 0 ng/mL (ref 0.00–0.08)

## 2016-11-30 LAB — MAGNESIUM: Magnesium: 1.6 mg/dL — ABNORMAL LOW (ref 1.7–2.4)

## 2016-11-30 MED ORDER — ONDANSETRON HCL 4 MG/2ML IJ SOLN
4.0000 mg | Freq: Once | INTRAMUSCULAR | Status: AC
  Administered 2016-11-30: 4 mg via INTRAVENOUS
  Filled 2016-11-30: qty 2

## 2016-11-30 MED ORDER — OXYCODONE-ACETAMINOPHEN 5-325 MG PO TABS
2.0000 | ORAL_TABLET | Freq: Once | ORAL | Status: AC
  Administered 2016-11-30: 2 via ORAL
  Filled 2016-11-30: qty 2

## 2016-11-30 MED ORDER — INSULIN ASPART 100 UNIT/ML ~~LOC~~ SOLN
10.0000 [IU] | Freq: Once | SUBCUTANEOUS | Status: AC
  Administered 2016-11-30: 10 [IU] via INTRAVENOUS
  Filled 2016-11-30: qty 1

## 2016-11-30 NOTE — ED Notes (Signed)
Pt stable, ambulatory, and verbalizes understanding of d/c instructions.  

## 2016-11-30 NOTE — ED Notes (Signed)
Patient transported to X-ray 

## 2016-11-30 NOTE — Discharge Instructions (Signed)
Please read and follow all provided instructions.  Your diagnoses today include:  1. Chest pain, unspecified type   2. Hyperglycemia     Tests performed today include: An EKG of your heart A chest x-ray Cardiac enzymes - a blood test for heart muscle damage Blood counts and electrolytes Vital signs. See below for your results today.   Medications prescribed:   Take any prescribed medications only as directed.  Follow-up instructions: Please follow-up with your Cardiologist as soon as you can for further evaluation of your symptoms.   Return instructions:  SEEK IMMEDIATE MEDICAL ATTENTION IF: You have severe chest pain, especially if the pain is crushing or pressure-like and spreads to the arms, back, neck, or jaw, or if you have sweating, nausea (feeling sick to your stomach), or shortness of breath. THIS IS AN EMERGENCY. Don't wait to see if the pain will go away. Get medical help at once. Call 911 or 0 (operator). DO NOT drive yourself to the hospital.  Your chest pain gets worse and does not go away with rest.  You have an attack of chest pain lasting longer than usual, despite rest and treatment with the medications your caregiver has prescribed.  You wake from sleep with chest pain or shortness of breath. You feel dizzy or faint. You have chest pain not typical of your usual pain for which you originally saw your caregiver.  You have any other emergent concerns regarding your health.  Additional Information: Chest pain comes from many different causes. Your caregiver has diagnosed you as having chest pain that is not specific for one problem, but does not require admission.  You are at low risk for an acute heart condition or other serious illness.   Your vital signs today were: BP (!) 102/51    Pulse 92    Temp 98.3 F (36.8 C) (Oral)    Resp 10    Wt 103 kg (227 lb)    SpO2 92%    BMI 35.55 kg/m  If your blood pressure (BP) was elevated above 135/85 this visit, please  have this repeated by your doctor within one month. --------------

## 2016-11-30 NOTE — ED Notes (Signed)
Gave pt and family members ice and soda

## 2016-11-30 NOTE — ED Provider Notes (Signed)
[facility] EMERGENCY DEPARTMENT Provider Note   CSN: [id number] Arrival date & time: 11/30/16  8527     History   Chief Complaint Chief Complaint  Patient presents with  . Back Pain  . Knee Pain  . Palpitations  . Dizziness  . Nausea    HPI [name] is a 66 y.o. Wilson.  HPI  66 y.o. Wilson with a hx of Nonobstructive CAD, Chronic Systolic/Diastolic CHF, HTN, DM2, PAF on Eliquis presents to the Emergency Department today due to palpitations. This occurred around 0930. Pt states this morning she started having palpitations as well as N/V x 2 after taking cardiac medications. Notes chest pressure that was in the middle of her chest for <1 hour in duration. No numbness/tingling. No diaphoresis. Notes having similar symptoms in past after taking her cardiac medications. Denies pain currently. No cough/congestion. No fevers. Per Chart Review, pt with hx nonischemic cardiomyopathy. Echo on 07-15-16 with EF 45-50%. LHC with nonobstructive CAD. She had a chronic LBBB, and Medtronic CRT-D device was placed in 10/14. There is a Cardiology note on 09-12-16 from Dr. Peter Martinique describing similar symptoms of palpitations, chest discomfort s/p taking cardiac medications.    Most Recent Echocardiogram 07-15-16 Normal LV size with moderate LV hypertrophy. EF 45-50%, mild   diffuse hypokinesis. Normal RV size with mildly decreased   systolic function. No significant valvular abnormalities.  Past Medical History:  Diagnosis Date  . Anemia    a. Noted on 07/2012 labs, instructed to f/u PCP.  Marland Kitchen Arthritis    "joints" (11/18/2012)  . Automatic implantable cardioverter-defibrillator in situ   . CAD (coronary artery disease), native coronary artery    a. Nonobstructive by cath 02/2012 (done because of low EF).  . Chronic bronchitis ([place])    "~ every other year" (11/18/2012)  . Chronic combined systolic and diastolic CHF (congestive heart failure) ([place])    a. 03/05/12 echo:   LVEF 20-25%, moderate LVH , inferior and basal to mid septal akinesis, anterior moderate to severe hypokinesis and grade 2 diastolic dysfunction. b. EF 07/2012: EF still 25% (unclear medication compliance).  . Chronic lower back pain   . Headache(784.0)    "often; maybe not daily" (11/18/2012)  . High cholesterol   . History of noncompliance with medical treatment   . Hypertension   . LBBB (left bundle branch block)   . Orthopnea   . Tobacco abuse   . Type II diabetes mellitus [facility])     Patient Active Problem List   Diagnosis Date Noted  . Paroxysmal A-fib ([place])   . Biventricular automatic implantable cardioverter defibrillator in situ   . Sepsis ([place]) 04/20/2016  . UTI (urinary tract infection) 04/20/2016  . Dyspnea 07/19/2015  . Interstitial lung disease ([place]) 11/20/2014  . SIRS (systemic inflammatory response syndrome) ([place]) 02/03/2014  . IDDM (insulin dependent diabetes mellitus) ([place]) 02/03/2014  . Tachycardia 06/14/2013  . Chronic systolic CHF (congestive heart failure) ([place]) 09/29/2012  . Hypoxia 03/06/2012  . Hypertensive heart disease 03/06/2012  . Nonischemic cardiomyopathy ([place]) 03/06/2012  . Tobacco abuse 03/06/2012  . Type 2 diabetes mellitus ([place]) 03/06/2012  . Hypertension 03/06/2012  . CAD (coronary artery disease), native coronary artery 03/06/2012  . Acute on chronic combined systolic and diastolic heart failure ([place]) 03/06/2012  . Hypokalemia 03/06/2012  . Acute bronchitis 02/01/2009  . Sleep apnea 02/01/2009  . Chest pain 02/01/2009    Past Surgical History:  Procedure Laterality Date  . BI-VENTRICULAR IMPLANTABLE CARDIOVERTER DEFIBRILLATOR  (  CRT-D)  11/18/2012  . CARDIAC CATHETERIZATION  03/04/12   nonobstructive CAD, elevated LVEDP and tortuous vessels suggestive of long-standing hypertension  . JOINT REPLACEMENT     Bilateral hip and right knee    OB History    No data available       Home Medications    Prior to Admission medications     Medication Sig Start Date End Date Taking? Authorizing Provider  acetaminophen (TYLENOL) 500 MG tablet Take 1 tablet (500 mg total) by mouth every 6 (six) hours as needed. 09/15/15   Leo Grosser, MD  albuterol (PROVENTIL HFA;VENTOLIN HFA) 108 (90 Base) MCG/ACT inhaler Inhale 2 puffs into the lungs every 6 (six) hours as needed for wheezing or shortness of breath. 04/25/16   Ghimire, Henreitta Leber, MD  apixaban (ELIQUIS) 5 MG TABS tablet Take 1 tablet (5 mg total) by mouth 2 (two) times daily. 05/22/16   Clegg, Amy D, NP  atorvastatin (LIPITOR) 40 MG tablet Take 1 tablet (40 mg total) by mouth daily. 05/22/16   Clegg, Amy D, NP  atorvastatin (LIPITOR) 40 MG tablet TAKE 1 TABLET(40 MG) BY MOUTH DAILY 10/07/16   Larey Dresser, MD  carvedilol (COREG) 12.5 MG tablet Take 1 tablet (12.5 mg total) by mouth 2 (two) times daily with a meal. 11/06/16   Larey Dresser, MD  ELIQUIS 5 MG TABS tablet TAKE 1 TABLET(5 MG) BY MOUTH TWICE DAILY 10/03/16   Larey Dresser, MD  furosemide (LASIX) 40 MG tablet Take 1 tablet (40 mg total) by mouth 2 (two) times daily. 10/21/16   Bensimhon, Shaune Pascal, MD  glucose blood (FREESTYLE TEST STRIPS) test strip Use as instructed 04/25/16   Ghimire, Henreitta Leber, MD  glucose monitoring kit (FREESTYLE) monitoring kit 1 each by Does not apply route 4 (four) times daily - after meals and at bedtime. 1 month Diabetic Testing Supplies for QAC-QHS accuchecks. 04/25/16   Ghimire, Henreitta Leber, MD  insulin aspart (NOVOLOG FLEXPEN) 100 UNIT/ML FlexPen 0-20 Units, Subcutaneous, 3 times daily with meals CBG < 70: implement hypoglycemia protocol CBG 70 - 120: 0 units CBG 121 - 150: 3 units CBG 151 - 200: 4 units CBG 201 - 250: 7 units CBG 251 - 300: 11 units CBG 301 - 350: 15 units CBG 351 - 400: 20 units CBG > 400: call MD 04/25/16   Jonetta Osgood, MD  Insulin Glargine (LANTUS SOLOSTAR) 100 UNIT/ML Solostar Pen Inject 50 Units into the skin daily at 10 pm.    [provider]  Insulin  Pen Needle 32G X 8 MM MISC Use as directed 06/18/16   Arbutus Leas, NP  isosorbide-hydrALAZINE (BIDIL) 20-37.5 MG tablet Take 2 tablets by mouth 3 (three) times daily. 09/11/16   Arbutus Leas, NP  Lancets (FREESTYLE) lancets Use as instructed 06/18/16   Arbutus Leas, NP  metFORMIN (GLUCOPHAGE) 1000 MG tablet TAKE 1 TABLET(1000 MG) BY MOUTH TWICE DAILY WITH A MEAL 05/30/16   Anja Friar, PA-C  oxyCODONE-acetaminophen (PERCOCET/ROXICET) 5-325 MG tablet Take 1 tablet by mouth every 6 (six) hours as needed for severe pain. 05/07/16   Charlann Lange, PA-C  potassium chloride 20 MEQ TBCR Take 40 mEq by mouth daily. 10/07/16   Arbutus Leas, NP  sacubitril-valsartan (ENTRESTO) 97-103 MG Take 1 tablet by mouth 2 (two) times daily. 06/04/16   Clegg, Amy D, NP  spironolactone (ALDACTONE) 25 MG tablet Take 1 tablet (25 mg total) by mouth daily. 05/22/16   Clegg,  Amy D, NP  spironolactone (ALDACTONE) 25 MG tablet TAKE 1 TABLET(25 MG) BY MOUTH DAILY 09/30/16   Bensimhon, Shaune Pascal, MD    Family History Family History  Problem Relation Age of Onset  . Heart disease Neg Hx     Social History Social History   Tobacco Use  . Smoking status: Former Smoker    Packs/day: 1.00    Years: 40.00    Pack years: 40.00    Types: Cigarettes    Start date: 06/23/1972    Last attempt to quit: 03/26/2012    Years since quitting: 4.6  . Smokeless tobacco: Never Used  Substance Use Topics  . Alcohol use: No  . Drug use: No     Allergies   Morphine and related   Review of Systems Review of Systems ROS reviewed and all are negative for acute change except as noted in the HPI.  Physical Exam Updated Vital Signs BP (!) 98/54 (BP Location: Right Arm)   Pulse (!) 101   Temp 98.3 F (36.8 C) (Oral)   Resp (!) 21   Wt 103 kg (227 lb)   SpO2 99%   BMI 35.55 kg/m   Physical Exam  Constitutional: She is oriented to person, place, and time. She appears well-developed and well-nourished. No distress.    HENT:  Head: Normocephalic and atraumatic.  Right Ear: Tympanic membrane, external ear and ear canal normal.  Left Ear: Tympanic membrane, external ear and ear canal normal.  Nose: Nose normal.  Mouth/Throat: Uvula is midline, oropharynx is clear and moist and mucous membranes are normal. No trismus in the jaw. No oropharyngeal exudate, posterior oropharyngeal erythema or tonsillar abscesses.  Eyes: EOM are normal. Pupils are equal, round, and reactive to light.  Neck: Normal range of motion. Neck supple. No tracheal deviation present.  Cardiovascular: Normal rate, regular rhythm, S1 normal, S2 normal, normal heart sounds, intact distal pulses and normal pulses.  Pulmonary/Chest: Effort normal and breath sounds normal. No respiratory distress. She has no decreased breath sounds. She has no wheezes. She has no rhonchi. She has no rales.  Abdominal: Normal appearance and bowel sounds are normal. There is no tenderness.  Musculoskeletal: Normal range of motion.  Neurological: She is alert and oriented to person, place, and time.  Skin: Skin is warm and dry.  Psychiatric: She has a normal mood and affect. Her speech is normal and behavior is normal. Thought content normal.  Nursing note and vitals reviewed.  ED Treatments / Results  Labs (all labs ordered are listed, but only abnormal results are displayed) Labs Reviewed  BASIC METABOLIC PANEL - Abnormal; Notable for the following components:      Result Value   Chloride 99 (*)    Glucose, Bld 414 (*)    BUN 31 (*)    Creatinine, Ser 1.56 (*)    GFR calc non Af Amer 34 (*)    GFR calc Af Amer 39 (*)    All other components within normal limits  CBC - Abnormal; Notable for the following components:   RBC 3.80 (*)    Hemoglobin 11.6 (*)    HCT 34.2 (*)    All other components within normal limits  MAGNESIUM - Abnormal; Notable for the following components:   Magnesium 1.6 (*)    All other components within normal limits  I-STAT  TROPONIN, ED  I-STAT TROPONIN, ED    EKG  EKG Interpretation  Date/Time:  Sunday November 30 2016 09:59:34 EST Ventricular Rate:  100 PR Interval:    QRS Duration: 123 QT Interval:  393 QTC Calculation: 507 R Axis:   -103 Text Interpretation:  Sinus tachycardia Right bundle branch block Anterolateral infarct, old Abnrm T, consider ischemia, anterolateral lds No significant change since last tracing Confirmed by Hooks, [place] [phone]) on 11/30/2016 10:20:02 AM       Radiology Dg Chest 2 View  Result Date: 11/30/2016 CLINICAL DATA:  Back pain with shob x 1 day hx of cad,.,hypertension,lbbb EXAM: CHEST  2 VIEW COMPARISON:  09/02/2016 FINDINGS: Borderline enlargement of the cardiac silhouette, stable. Left anterior chest wall biventricular cardioverter-defibrillator is stable and well positioned. No mediastinal or hilar masses. No convincing adenopathy. Clear lungs.  No pleural effusion or pneumothorax. Skeletal structures are intact. IMPRESSION: 1. No acute cardiopulmonary disease. Electronically Signed   By: Lajean Manes M.D.   On: 11/30/2016 11:14    Procedures Procedures (including critical care time)  Medications Ordered in ED Medications  oxyCODONE-acetaminophen (PERCOCET/ROXICET) 5-325 MG per tablet 2 tablet (2 tablets Oral Given 11/30/16 1115)  insulin aspart (novoLOG) injection 10 Units (10 Units Intravenous Given 11/30/16 1202)  ondansetron (ZOFRAN) injection 4 mg (4 mg Intravenous Given 11/30/16 1305)     Initial Impression / Colleen and Plan / ED Course  I have reviewed the triage vital signs and the nursing notes.  Pertinent labs & imaging results that were available during my care of the patient were reviewed by me and considered in my medical decision making (see chart for details).  Final Clinical Impressions(s) / ED Diagnoses  {I have reviewed and evaluated the relevant laboratory values. {I have reviewed and evaluated the relevant imaging studies. {I have  interpreted the relevant EKG. {I have reviewed the relevant previous healthcare records. {I have reviewed EMS Documentation. {I obtained HPI from historian. {Patient discussed with supervising physician.  ED Course:  Colleen: Pt is a 66 y.o. Wilson with a hx of Nonobstructive CAD, Chronic Systolic/Diastolic CHF, HTN, DM2, PAF on Eliquis presents to the Emergency Department today due to palpitations. This occurred around 0930. Pt states this morning she started having palpitations as well as N/V x 2 after taking cardiac medications. Notes chest pressure that was in the middle of her chest for <1 hour in duration. No numbness/tingling. No diaphoresis. Notes having similar symptoms in past after taking her cardiac medications. Denies pain currently. No cough/congestion. No fevers. Per Chart Review, pt with hx nonischemic cardiomyopathy. Echo on 07-15-16 with EF 45-50%. LHC with nonobstructive CAD. She had a chronic LBBB, and Medtronic CRT-D device was placed in 10/14. There is a Cardiology note on 09-12-16 from Dr. Peter Martinique describing similar symptoms of palpitations, chest discomfort s/p taking cardiac medications. On exam, pt in NAD. Nontoxic/nonseptic appearing. VSS. Afebrile. Lungs CTA. Heart RRR. Abdomen nontender soft. EKG unremarkable. istat Trop negative x2. CBC unremarkable. BMP with elevated glucose 414. No evidence of DKAGiven Insulin. CXR unremarkable. Discussed case with Cardiology (Dr. Radford Pax) who reviewed chart and recommended outpatient evaluation as pt stable and chest pain free. Plan is to DC home with close follow up to Cardiology. Strict return precautions given.. At time of discharge, Patient is in no acute distress. Vital Signs are stable. Patient is able to ambulate. Patient able to tolerate PO.   Disposition/Plan:  DC Home Additional Verbal discharge instructions given and discussed with patient.  Pt Instructed to f/u with cardiology in the next week for evaluation and treatment of  symptoms. Return precautions given Pt acknowledges and agrees with plan  Supervising Physician Mackuen, Courteney Lyn, *  Final diagnoses:  Chest pain, unspecified type  Hyperglycemia    New Prescriptions This SmartLink is deprecated. Use AVSMEDLIST instead to display the medication list for a patient.     Shary Decamp, PA-C 11/30/16 1452    Macarthur Critchley, MD 12/02/16 2259

## 2016-11-30 NOTE — ED Triage Notes (Signed)
Received pt from home with c/o woke up this morning with palpitations, nausea and dizziness. Pt took all her morning medications. Pt also c/o back and right knee pain onset yesterday. Pt reports that she has been doing a lot of cooking for past 2 days and have been on her feet a lot. Pt reprots symptoms of palpitations dizziness and nausea occur when she takes her medications.

## 2016-12-01 ENCOUNTER — Telehealth (HOSPITAL_COMMUNITY): Payer: Self-pay

## 2016-12-01 NOTE — Telephone Encounter (Signed)
CHF Clinic appointment reminder call placed to patient for upcoming post-hospital follow up.  LVMTCB to confirm apt.  Patient also reminded to take all medications as prescribed on the day of his/her appointment and to bring all medications to this appointment.  Advised to call our office for tardiness or cancellations/rescheduling needs.  .[name]  

## 2016-12-02 ENCOUNTER — Ambulatory Visit (HOSPITAL_COMMUNITY)
Admission: RE | Admit: 2016-12-02 | Discharge: 2016-12-02 | Disposition: A | Discharge status: Home / Self Care | Payer: Medicare Other | Source: Ambulatory Visit | Attending: Internal Medicine | Admitting: Internal Medicine

## 2016-12-02 ENCOUNTER — Encounter (HOSPITAL_COMMUNITY): Payer: Self-pay

## 2016-12-02 ENCOUNTER — Other Ambulatory Visit (HOSPITAL_COMMUNITY): Payer: Self-pay

## 2016-12-02 VITALS — BP 182/110 | HR 110 | Wt 227.4 lb

## 2016-12-02 DIAGNOSIS — Z8249 Family history of ischemic heart disease and other diseases of the circulatory system: Secondary | ICD-10-CM | POA: Diagnosis not present

## 2016-12-02 DIAGNOSIS — Z9119 Patient's noncompliance with other medical treatment and regimen: Secondary | ICD-10-CM | POA: Diagnosis not present

## 2016-12-02 DIAGNOSIS — I110 Hypertensive heart disease with heart failure: Secondary | ICD-10-CM | POA: Diagnosis not present

## 2016-12-02 DIAGNOSIS — I428 Other cardiomyopathies: Secondary | ICD-10-CM | POA: Diagnosis not present

## 2016-12-02 DIAGNOSIS — Z9114 Patient's other noncompliance with medication regimen: Secondary | ICD-10-CM | POA: Diagnosis not present

## 2016-12-02 DIAGNOSIS — E785 Hyperlipidemia, unspecified: Secondary | ICD-10-CM | POA: Insufficient documentation

## 2016-12-02 DIAGNOSIS — Z794 Long term (current) use of insulin: Secondary | ICD-10-CM | POA: Diagnosis not present

## 2016-12-02 DIAGNOSIS — E119 Type 2 diabetes mellitus without complications: Secondary | ICD-10-CM | POA: Insufficient documentation

## 2016-12-02 DIAGNOSIS — Z79899 Other long term (current) drug therapy: Secondary | ICD-10-CM | POA: Insufficient documentation

## 2016-12-02 DIAGNOSIS — I10 Essential (primary) hypertension: Secondary | ICD-10-CM

## 2016-12-02 DIAGNOSIS — I480 Paroxysmal atrial fibrillation: Secondary | ICD-10-CM | POA: Insufficient documentation

## 2016-12-02 DIAGNOSIS — I429 Cardiomyopathy, unspecified: Secondary | ICD-10-CM | POA: Diagnosis not present

## 2016-12-02 DIAGNOSIS — Z7901 Long term (current) use of anticoagulants: Secondary | ICD-10-CM | POA: Diagnosis not present

## 2016-12-02 DIAGNOSIS — Z87891 Personal history of nicotine dependence: Secondary | ICD-10-CM | POA: Diagnosis not present

## 2016-12-02 DIAGNOSIS — I5022 Chronic systolic (congestive) heart failure: Secondary | ICD-10-CM

## 2016-12-02 DIAGNOSIS — Z79891 Long term (current) use of opiate analgesic: Secondary | ICD-10-CM | POA: Diagnosis not present

## 2016-12-02 DIAGNOSIS — I2510 Atherosclerotic heart disease of native coronary artery without angina pectoris: Secondary | ICD-10-CM | POA: Insufficient documentation

## 2016-12-02 NOTE — Progress Notes (Signed)
Patient ID: [name], female   DOB: [birth year], 66 y.o.   MRN: [id number]   ADVANCED HF CLINIC NOTE  PCP: Dr. Alyson Ingles Cardiology: Dr Aundra Dubin  33 yo with history of nonischemic cardiomyopathy.  She was admitted with CHF exacerbation in 02/2012.  EF 20-25% on echo, LHC with nonobstructive CAD.  She was started on cardiac meds and discharged.  In 07/2012, she was admitted again with CHF exacerbation.  She had run out of Lasix.  She was taking her other heart medications as ordered, however.  She was diuresed and discharged. She had a chronic LBBB, and Medtronic CRT-D device was placed in 10/14.  She was admitted in 6/16 with hypertensive emergency and CHF exacerbation.   Also of note, she had PFTs in 9/16 showing restrictive spirometry with low lung volume and DLCO.  She was supposed to get a high resolution CT to evaluate for interstitial lung disease but never had the study.     Admitted March 2018 with urosepsis--> E Coli Bacteremia. Completed antibiotic course. EF was down from previous to 30-35%. Also had atrial fibrillation so she was loaded on amiodarone. Placed on eliquis. Discharge weight was 208 pounds.  She is now off amiodarone.   Today she returns for HF follow up. Overall feeling fine. Denies PND/Orthopnea. SOB with steps and incline.  Appetite ok. No fever or chills. No BRBPR. Not able to weight at home. The floor in her house is not level so weights are not accurate. Blood sugar has extremely hight at home > 500. Eating high salt foods such as bologna and the buffet at [facility]. She has been taking medication intermittently. Followed by Paramedicine.   Labs (2/14): SPEP negative, UPEP negative, HIV negative Labs (7/14): K 4, creatinine 1.0, LDL 66 Labs (08/25/12): K 4, creatinine 1.0 Labs (10/06/12) : K 3.3 creatinine 0.7 pro-bnp 188 Labs (11/15/12): K 4.4, creatinine 1.04, pro-BNP 468 Labs (3/15): K 4.3 Creatinine 0.83 Labs (5/15): K 4.1 Creatinine 0.85 Labs (9/16): K 4.3,  creatinine 1.01 => 0.84, HCT 38.1, BNP 234 Labs (6/17): K 3.6, creatinine 0.78 Labs (05/05/2016): K 4.9 Creatinine 1.25. Labs (5/18): TSH normal, LFTs, normal, K 4.1, creatinine 1.16   PMH: 1. HTN 2. Type II diabetes 3. Nonischemic cardiomyopathy: ? Due to HTN versus LBBB CMP.  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  Echo (2/14) with EF 20-25%.  Echo (7/14) with EF 25%, diffuse hypokinesis.  HIV, SPEP, UPEP negative.  Has LBBB. CRT-D 10/2012 (Medtronic).  Echo (4/15) with EF 45-50%, mild diffuse hypokinesis, PA systolic pressure 38 mmHg.  Echo (6/16) with EF 40-45%, mild LVH, septal and inferior hypokinesis.   - Echo (3/18): EF 30-35%. Grade 1 DD - Echo (6/18): EF 45-50%, moderate LVH, normal RV size with mildly decreased systolic function.  4. Chronic LBBB 5. Right TKR 6. Bilateral THR.  7. Hyperlipidemia 8. ?COPD: Has oxygen for use with exertion.  - PFTs (9/16) with FEV1 77%, FVC 76%, ratio 101%, TLC 63%, DLCO 41% => moderate restrictive deficit  9. Atrial fibrillation: Paroxysmal.   SH: Prior smoker, quit 2/14.  Never drank ETOH.  No drugs. Lives with son.   FH: Mother with "heart trouble."   ROS: All systems reviewed and negative except as per HPI.   Current Outpatient Medications  Medication Sig Dispense Refill  . acetaminophen (TYLENOL) 500 MG tablet Take 1 tablet (500 mg total) by mouth every 6 (six) hours as needed. 30 tablet 0  . albuterol (PROVENTIL HFA;VENTOLIN HFA) 108 (90 Base)  MCG/ACT inhaler Inhale 2 puffs into the lungs every 6 (six) hours as needed for wheezing or shortness of breath. 18 g 0  . apixaban (ELIQUIS) 5 MG TABS tablet Take 1 tablet (5 mg total) by mouth 2 (two) times daily. 60 tablet 3  . atorvastatin (LIPITOR) 40 MG tablet Take 1 tablet (40 mg total) by mouth daily. 30 tablet 3  . carvedilol (COREG) 12.5 MG tablet Take 1 tablet (12.5 mg total) by mouth 2 (two) times daily with a meal. 180 tablet 3  . ELIQUIS 5 MG TABS tablet TAKE 1 TABLET(5 MG) BY MOUTH TWICE  DAILY 60 tablet 3  . furosemide (LASIX) 40 MG tablet Take 1 tablet (40 mg total) by mouth 2 (two) times daily. 180 tablet 3  . glucose blood (FREESTYLE TEST STRIPS) test strip Use as instructed 100 each 0  . glucose monitoring kit (FREESTYLE) monitoring kit 1 each by Does not apply route 4 (four) times daily - after meals and at bedtime. 1 month Diabetic Testing Supplies for QAC-QHS accuchecks. 1 each 1  . insulin aspart (NOVOLOG FLEXPEN) 100 UNIT/ML FlexPen 0-20 Units, Subcutaneous, 3 times daily with meals CBG < 70: implement hypoglycemia protocol CBG 70 - 120: 0 units CBG 121 - 150: 3 units CBG 151 - 200: 4 units CBG 201 - 250: 7 units CBG 251 - 300: 11 units CBG 301 - 350: 15 units CBG 351 - 400: 20 units CBG > 400: call MD 15 mL 0  . Insulin Glargine (LANTUS SOLOSTAR) 100 UNIT/ML Solostar Pen Inject 50 Units into the skin daily at 10 pm.    . Insulin Pen Needle 32G X 8 MM MISC Use as directed 100 each 0  . isosorbide-hydrALAZINE (BIDIL) 20-37.5 MG tablet Take 2 tablets by mouth 3 (three) times daily. 180 tablet 6  . Lancets (FREESTYLE) lancets Use as instructed 100 each 0  . metFORMIN (GLUCOPHAGE) 1000 MG tablet TAKE 1 TABLET(1000 MG) BY MOUTH TWICE DAILY WITH A MEAL 60 tablet 0  . naproxen sodium (ALEVE) 220 MG tablet Take 220 mg daily as needed by mouth (pain).    Marland Kitchen oxyCODONE-acetaminophen (PERCOCET/ROXICET) 5-325 MG tablet Take 1 tablet by mouth every 6 (six) hours as needed for severe pain. 8 tablet 0  . potassium chloride 20 MEQ TBCR Take 40 mEq by mouth daily. 60 tablet 3  . sacubitril-valsartan (ENTRESTO) 97-103 MG Take 1 tablet by mouth 2 (two) times daily. 60 tablet 6  . spironolactone (ALDACTONE) 25 MG tablet Take 1 tablet (25 mg total) by mouth daily. 30 tablet 3  . spironolactone (ALDACTONE) 25 MG tablet TAKE 1 TABLET(25 MG) BY MOUTH DAILY 30 tablet 3   No current facility-administered medications for this encounter.     Vitals:   12/02/16 1105  BP: (!) 182/110    Pulse: (!) 110  SpO2: 93%  Weight: 227 lb 6.4 oz (103.1 kg)   Filed Weights   12/02/16 1105  Weight: 227 lb 6.4 oz (103.1 kg)   General:  Well appearing. No resp difficulty. Walked in the clinic.  HEENT: normal Neck: supple. no JVD. Carotids 2+ bilat; no bruits. No lymphadenopathy or thryomegaly appreciated. Cor: PMI nondisplaced. Regular rate & rhythm. No rubs, gallops or murmurs. Lungs: clear Abdomen: soft, nontender, nondistended. No hepatosplenomegaly. No bruits or masses. Good bowel sounds. Extremities: no cyanosis, clubbing, rash, edema Neuro: alert & orientedx3, cranial nerves grossly intact. moves all 4 extremities w/o difficulty. Affect pleasant   Assessment/Plan: 1. Chronic systolic CHF: Nonischemic cardiomyopathy,  HTN. S/P CRT-D (Medtronic). Echo 06/2016 with improved EF 45-50%. - NYHA II-III - Volume status stable. Continue lasix 40 mg twice a day.  - Continue current coreg, entresto, bidil, and spiro.  - I will not increase because she has not been taking medications.   2. Hyperlipidemia:  - Continue atorvastatin   3. HTN:  Elevated but she has not had medications today.  Will ask Paramedic to fill pill box and follow up the week.   4. PAF: -  Regular pulse.  Continue Eliquis for anticoagulation.   No med changes today due to noncompliance.  Greater than 50% of the (total minutes 25) visit spent in counseling/coordination of care regarding medication compliance and HF idea.   May need to refer to endocrinology with hyperglycemia. She will consider.   Follow up in 2 weeks.   Darrick Grinder, NP  1:30 PM  12/02/2016

## 2016-12-02 NOTE — Patient Instructions (Signed)
Follow-up in 2 weeks

## 2016-12-02 NOTE — Progress Notes (Signed)
Paramedicine Encounter   Patient ID: [name] , female,   DOB: August 05, 1950,66 y.o.,  MRN: [id number]   Met patient in clinic today with provider Amy.   Pt is hypertensive during today's visit.  Amy talked to her about taking her meds regularly and her blood pressure. She also talked to her about her diet.  Fluid level looks good, the floccitation may be due to the scale.    F/u with an endecrinologist to watch her diabetes.    Time spent with patient 71mns  No med changes this visit. Pt has hx of not taking her meds regularly  [place], EMT-Paramedic 12/02/2016   ACTION: Home visit completed Next visit planned for f/u pill box

## 2016-12-05 ENCOUNTER — Other Ambulatory Visit (HOSPITAL_COMMUNITY): Payer: Self-pay

## 2016-12-05 NOTE — Progress Notes (Signed)
Paramedicine Encounter    Patient ID: [name], female    DOB: Jul 25, 1950, 66 y.o.   MRN: [id number]    Patient Care Team: [name], MD as PCP - General (Family Medicine)  Patient Active Problem List   Diagnosis Date Noted  . Paroxysmal A-fib ([place])   . Biventricular automatic implantable cardioverter defibrillator in situ   . Sepsis ([place]) 04/20/2016  . UTI (urinary tract infection) 04/20/2016  . Dyspnea 07/19/2015  . Interstitial lung disease ([place]) 11/20/2014  . SIRS (systemic inflammatory response syndrome) ([place]) 02/03/2014  . IDDM (insulin dependent diabetes mellitus) ([place]) 02/03/2014  . Tachycardia 06/14/2013  . Chronic systolic CHF (congestive heart failure) ([place]) 09/29/2012  . Hypoxia 03/06/2012  . Hypertensive heart disease 03/06/2012  . Nonischemic cardiomyopathy ([place]) 03/06/2012  . Tobacco abuse 03/06/2012  . Type 2 diabetes mellitus ([place]) 03/06/2012  . Hypertension 03/06/2012  . CAD (coronary artery disease), native coronary artery 03/06/2012  . Acute on chronic combined systolic and diastolic heart failure ([place]) 03/06/2012  . Hypokalemia 03/06/2012  . Acute bronchitis 02/01/2009  . Sleep apnea 02/01/2009  . Chest pain 02/01/2009    Current Outpatient Medications:  .  apixaban (ELIQUIS) 5 MG TABS tablet, Take 1 tablet (5 mg total) by mouth 2 (two) times daily., Disp: 60 tablet, Rfl: 3 .  atorvastatin (LIPITOR) 40 MG tablet, Take 1 tablet (40 mg total) by mouth daily., Disp: 30 tablet, Rfl: 3 .  carvedilol (COREG) 12.5 MG tablet, Take 1 tablet (12.5 mg total) by mouth 2 (two) times daily with a meal., Disp: 180 tablet, Rfl: 3 .  ELIQUIS 5 MG TABS tablet, TAKE 1 TABLET(5 MG) BY MOUTH TWICE DAILY, Disp: 60 tablet, Rfl: 3 .  furosemide (LASIX) 40 MG tablet, Take 1 tablet (40 mg total) by mouth 2 (two) times daily., Disp: 180 tablet, Rfl: 3 .  isosorbide-hydrALAZINE (BIDIL) 20-37.5 MG tablet, Take 2 tablets by mouth 3 (three) times daily., Disp: 180 tablet,  Rfl: 6 .  metFORMIN (GLUCOPHAGE) 1000 MG tablet, TAKE 1 TABLET(1000 MG) BY MOUTH TWICE DAILY WITH A MEAL, Disp: 60 tablet, Rfl: 0 .  potassium chloride 20 MEQ TBCR, Take 40 mEq by mouth daily., Disp: 60 tablet, Rfl: 3 .  sacubitril-valsartan (ENTRESTO) 97-103 MG, Take 1 tablet by mouth 2 (two) times daily., Disp: 60 tablet, Rfl: 6 .  spironolactone (ALDACTONE) 25 MG tablet, Take 1 tablet (25 mg total) by mouth daily., Disp: 30 tablet, Rfl: 3 .  acetaminophen (TYLENOL) 500 MG tablet, Take 1 tablet (500 mg total) by mouth every 6 (six) hours as needed., Disp: 30 tablet, Rfl: 0 .  albuterol (PROVENTIL HFA;VENTOLIN HFA) 108 (90 Base) MCG/ACT inhaler, Inhale 2 puffs into the lungs every 6 (six) hours as needed for wheezing or shortness of breath., Disp: 18 g, Rfl: 0 .  glucose blood (FREESTYLE TEST STRIPS) test strip, Use as instructed, Disp: 100 each, Rfl: 0 .  glucose monitoring kit (FREESTYLE) monitoring kit, 1 each by Does not apply route 4 (four) times daily - after meals and at bedtime. 1 month Diabetic Testing Supplies for QAC-QHS accuchecks., Disp: 1 each, Rfl: 1 .  insulin aspart (NOVOLOG FLEXPEN) 100 UNIT/ML FlexPen, 0-20 Units, Subcutaneous, 3 times daily with meals CBG < 70: implement hypoglycemia protocol CBG 70 - 120: 0 units CBG 121 - 150: 3 units CBG 151 - 200: 4 units CBG 201 - 250: 7 units CBG 251 - 300: 11 units CBG 301 - 350: 15 units CBG 351 - 400: 20  units CBG > 400: call MD, Disp: 15 mL, Rfl: 0 .  Insulin Glargine (LANTUS SOLOSTAR) 100 UNIT/ML Solostar Pen, Inject 50 Units into the skin daily at 10 pm., Disp: , Rfl:  .  Insulin Pen Needle 32G X 8 MM MISC, Use as directed, Disp: 100 each, Rfl: 0 .  Lancets (FREESTYLE) lancets, Use as instructed, Disp: 100 each, Rfl: 0 .  naproxen sodium (ALEVE) 220 MG tablet, Take 220 mg daily as needed by mouth (pain)., Disp: , Rfl:  .  oxyCODONE-acetaminophen (PERCOCET/ROXICET) 5-325 MG tablet, Take 1 tablet by mouth every 6 (six) hours as needed for  severe pain., Disp: 8 tablet, Rfl: 0 .  spironolactone (ALDACTONE) 25 MG tablet, TAKE 1 TABLET(25 MG) BY MOUTH DAILY, Disp: 30 tablet, Rfl: 3 Allergies  Allergen Reactions  . Morphine And Related Nausea And Vomiting and Other (See Comments)    Severe nausea     Social History   Socioeconomic History  . Marital status: Single    Spouse name: Not on file  . Number of children: Not on file  . Years of education: Not on file  . Highest education level: Not on file  Social Needs  . Financial resource strain: Not on file  . Food insecurity - worry: Not on file  . Food insecurity - inability: Not on file  . Transportation needs - medical: Not on file  . Transportation needs - non-medical: Not on file  Occupational History  . Not on file  Tobacco Use  . Smoking status: Former Smoker    Packs/day: 1.00    Years: 40.00    Pack years: 40.00    Types: Cigarettes    Start date: 06/23/1972    Last attempt to quit: 03/26/2012    Years since quitting: 4.6  . Smokeless tobacco: Never Used  Substance and Sexual Activity  . Alcohol use: No  . Drug use: No  . Sexual activity: Yes  Other Topics Concern  . Not on file  Social History Narrative  . Not on file    Physical Exam  Pulmonary/Chest: No respiratory distress.  Abdominal: She exhibits no distension. There is no tenderness. There is no guarding.  Musculoskeletal: She exhibits no edema.  Skin: Skin is warm and dry. She is not diaphoretic.        Future Appointments  Date Time Provider [place]  12/16/2016 11:00 AM [facility] PA/NP [facility] None    ATF pt CAO x4 laying across her bed c/o dizziness.  Pt stated that she just took her morning meds around 830 and they make her dizzy when she first take it. Pt has taken all of meds this week. She denies sob, chest pain and no syncope noted.  Pt stated that the dizziness usually goes away after she a hour or so. Pt ate a sandwich prior to my arrival and its a cake beside her  bed.  pts cbg is elevated but she doesn't want to take insulin because she "just took metformin".  I advised her to recheck her cbg in about an hour to see if there's a change and if not take insulin according to her chart. rx bottles verified and pill box refilled.   BP 102/70   Pulse 91   Resp 16   SpO2 97%  cbg 360 (pt hasn't taken anything  Pt didn't feel comfortable standing up and taking her weight, therefore I asked her to call me later with her weight.   Keilana Morlock, EMT Paramedic  12/05/2016    ACTION: Home visit completed Next visit planned for next thursday

## 2016-12-08 ENCOUNTER — Other Ambulatory Visit: Payer: Self-pay

## 2016-12-08 ENCOUNTER — Encounter (HOSPITAL_COMMUNITY): Payer: Self-pay | Admitting: Emergency Medicine

## 2016-12-09 ENCOUNTER — Encounter (HOSPITAL_COMMUNITY): Payer: Medicare Other | Admitting: Cardiology

## 2016-12-11 ENCOUNTER — Other Ambulatory Visit (HOSPITAL_COMMUNITY): Payer: Self-pay

## 2016-12-11 ENCOUNTER — Encounter (HOSPITAL_COMMUNITY): Payer: Self-pay

## 2016-12-11 NOTE — Progress Notes (Signed)
Paramedicine Encounter    Patient ID: [name], female    DOB: Jul 25, 1950, 66 y.o.   MRN: [id number]    Patient Care Team: [name], MD as PCP - General (Family Medicine)  Patient Active Problem List   Diagnosis Date Noted  . Paroxysmal A-fib ([place])   . Biventricular automatic implantable cardioverter defibrillator in situ   . Sepsis ([place]) 04/20/2016  . UTI (urinary tract infection) 04/20/2016  . Dyspnea 07/19/2015  . Interstitial lung disease ([place]) 11/20/2014  . SIRS (systemic inflammatory response syndrome) ([place]) 02/03/2014  . IDDM (insulin dependent diabetes mellitus) ([place]) 02/03/2014  . Tachycardia 06/14/2013  . Chronic systolic CHF (congestive heart failure) ([place]) 09/29/2012  . Hypoxia 03/06/2012  . Hypertensive heart disease 03/06/2012  . Nonischemic cardiomyopathy ([place]) 03/06/2012  . Tobacco abuse 03/06/2012  . Type 2 diabetes mellitus ([place]) 03/06/2012  . Hypertension 03/06/2012  . CAD (coronary artery disease), native coronary artery 03/06/2012  . Acute on chronic combined systolic and diastolic heart failure ([place]) 03/06/2012  . Hypokalemia 03/06/2012  . Acute bronchitis 02/01/2009  . Sleep apnea 02/01/2009  . Chest pain 02/01/2009    Current Outpatient Medications:  .  apixaban (ELIQUIS) 5 MG TABS tablet, Take 1 tablet (5 mg total) by mouth 2 (two) times daily., Disp: 60 tablet, Rfl: 3 .  atorvastatin (LIPITOR) 40 MG tablet, Take 1 tablet (40 mg total) by mouth daily., Disp: 30 tablet, Rfl: 3 .  carvedilol (COREG) 12.5 MG tablet, Take 1 tablet (12.5 mg total) by mouth 2 (two) times daily with a meal., Disp: 180 tablet, Rfl: 3 .  ELIQUIS 5 MG TABS tablet, TAKE 1 TABLET(5 MG) BY MOUTH TWICE DAILY, Disp: 60 tablet, Rfl: 3 .  furosemide (LASIX) 40 MG tablet, Take 1 tablet (40 mg total) by mouth 2 (two) times daily., Disp: 180 tablet, Rfl: 3 .  isosorbide-hydrALAZINE (BIDIL) 20-37.5 MG tablet, Take 2 tablets by mouth 3 (three) times daily., Disp: 180 tablet,  Rfl: 6 .  metFORMIN (GLUCOPHAGE) 1000 MG tablet, TAKE 1 TABLET(1000 MG) BY MOUTH TWICE DAILY WITH A MEAL, Disp: 60 tablet, Rfl: 0 .  potassium chloride 20 MEQ TBCR, Take 40 mEq by mouth daily., Disp: 60 tablet, Rfl: 3 .  sacubitril-valsartan (ENTRESTO) 97-103 MG, Take 1 tablet by mouth 2 (two) times daily., Disp: 60 tablet, Rfl: 6 .  spironolactone (ALDACTONE) 25 MG tablet, Take 1 tablet (25 mg total) by mouth daily., Disp: 30 tablet, Rfl: 3 .  acetaminophen (TYLENOL) 500 MG tablet, Take 1 tablet (500 mg total) by mouth every 6 (six) hours as needed., Disp: 30 tablet, Rfl: 0 .  albuterol (PROVENTIL HFA;VENTOLIN HFA) 108 (90 Base) MCG/ACT inhaler, Inhale 2 puffs into the lungs every 6 (six) hours as needed for wheezing or shortness of breath., Disp: 18 g, Rfl: 0 .  glucose blood (FREESTYLE TEST STRIPS) test strip, Use as instructed, Disp: 100 each, Rfl: 0 .  glucose monitoring kit (FREESTYLE) monitoring kit, 1 each by Does not apply route 4 (four) times daily - after meals and at bedtime. 1 month Diabetic Testing Supplies for QAC-QHS accuchecks., Disp: 1 each, Rfl: 1 .  insulin aspart (NOVOLOG FLEXPEN) 100 UNIT/ML FlexPen, 0-20 Units, Subcutaneous, 3 times daily with meals CBG < 70: implement hypoglycemia protocol CBG 70 - 120: 0 units CBG 121 - 150: 3 units CBG 151 - 200: 4 units CBG 201 - 250: 7 units CBG 251 - 300: 11 units CBG 301 - 350: 15 units CBG 351 - 400: 20  units CBG > 400: call MD, Disp: 15 mL, Rfl: 0 .  Insulin Glargine (LANTUS SOLOSTAR) 100 UNIT/ML Solostar Pen, Inject 50 Units into the skin daily at 10 pm., Disp: , Rfl:  .  Insulin Pen Needle 32G X 8 MM MISC, Use as directed, Disp: 100 each, Rfl: 0 .  Lancets (FREESTYLE) lancets, Use as instructed, Disp: 100 each, Rfl: 0 .  naproxen sodium (ALEVE) 220 MG tablet, Take 220 mg daily as needed by mouth (pain)., Disp: , Rfl:  .  oxyCODONE-acetaminophen (PERCOCET/ROXICET) 5-325 MG tablet, Take 1 tablet by mouth every 6 (six) hours as needed for  severe pain., Disp: 8 tablet, Rfl: 0 .  spironolactone (ALDACTONE) 25 MG tablet, TAKE 1 TABLET(25 MG) BY MOUTH DAILY, Disp: 30 tablet, Rfl: 3 Allergies  Allergen Reactions  . Morphine And Related Nausea And Vomiting and Other (See Comments)    Severe nausea     Social History   Socioeconomic History  . Marital status: Single    Spouse name: Not on file  . Number of children: Not on file  . Years of education: Not on file  . Highest education level: Not on file  Social Needs  . Financial resource strain: Not on file  . Food insecurity - worry: Not on file  . Food insecurity - inability: Not on file  . Transportation needs - medical: Not on file  . Transportation needs - non-medical: Not on file  Occupational History  . Not on file  Tobacco Use  . Smoking status: Former Smoker    Packs/day: 1.00    Years: 40.00    Pack years: 40.00    Types: Cigarettes    Start date: 06/23/1972    Last attempt to quit: 03/26/2012    Years since quitting: 4.7  . Smokeless tobacco: Never Used  Substance and Sexual Activity  . Alcohol use: No  . Drug use: No  . Sexual activity: Yes  Other Topics Concern  . Not on file  Social History Narrative  . Not on file    Physical Exam  Abdominal: She exhibits no distension. There is no tenderness. There is no guarding.  Musculoskeletal: She exhibits no edema.  Skin: Skin is warm and dry. She is not diaphoretic.        Future Appointments  Date Time Provider [place]  12/16/2016 11:00 AM [facility] PA/NP [facility] None    ATF pt CAO x4 sitting on the side of her bed with no complaints.  She is taking fluids and meds for the colonostophy she has scheduled for tomorrow.  Pt stated that she was instructed by her physician not to take her meds today and last night.  She did tkake 42 units of insulin prior to me leaving due to her CBG.  Pt ate grits, eggs and toast for breakfast this am. Pt denies sob, dizziness, lightheadedness and chest  pain.  rx bottles verified and pill box refilled.    BP (!) 154/90   Pulse 91   Resp 16   Wt 230 lb (104.3 kg)   SpO2 98%   BMI 36.02 kg/m  CBG 499 Weight yesterday-230 Last visit weight-227    Natilie Krabbenhoft, EMT Paramedic 12/11/2016    ACTION: Home visit completed

## 2016-12-12 ENCOUNTER — Encounter (HOSPITAL_COMMUNITY): Payer: Self-pay

## 2016-12-12 ENCOUNTER — Ambulatory Visit (HOSPITAL_COMMUNITY): Payer: Medicare Other | Admitting: Anesthesiology

## 2016-12-12 ENCOUNTER — Encounter (HOSPITAL_COMMUNITY)
Admission: RE | Disposition: A | Discharge status: Home / Self Care | Payer: Self-pay | Source: Ambulatory Visit | Attending: Gastroenterology

## 2016-12-12 ENCOUNTER — Ambulatory Visit (HOSPITAL_COMMUNITY)
Admission: RE | Admit: 2016-12-12 | Discharge: 2016-12-12 | Disposition: A | Discharge status: Home / Self Care | Payer: Medicare Other | Source: Ambulatory Visit | Attending: Gastroenterology | Admitting: Gastroenterology

## 2016-12-12 DIAGNOSIS — I2510 Atherosclerotic heart disease of native coronary artery without angina pectoris: Secondary | ICD-10-CM | POA: Insufficient documentation

## 2016-12-12 DIAGNOSIS — Z1211 Encounter for screening for malignant neoplasm of colon: Secondary | ICD-10-CM | POA: Insufficient documentation

## 2016-12-12 DIAGNOSIS — Z885 Allergy status to narcotic agent status: Secondary | ICD-10-CM | POA: Insufficient documentation

## 2016-12-12 DIAGNOSIS — I5042 Chronic combined systolic (congestive) and diastolic (congestive) heart failure: Secondary | ICD-10-CM | POA: Diagnosis not present

## 2016-12-12 DIAGNOSIS — M1990 Unspecified osteoarthritis, unspecified site: Secondary | ICD-10-CM | POA: Insufficient documentation

## 2016-12-12 DIAGNOSIS — D123 Benign neoplasm of transverse colon: Secondary | ICD-10-CM | POA: Diagnosis not present

## 2016-12-12 DIAGNOSIS — E119 Type 2 diabetes mellitus without complications: Secondary | ICD-10-CM | POA: Diagnosis not present

## 2016-12-12 DIAGNOSIS — E7800 Pure hypercholesterolemia, unspecified: Secondary | ICD-10-CM | POA: Insufficient documentation

## 2016-12-12 DIAGNOSIS — I110 Hypertensive heart disease with heart failure: Secondary | ICD-10-CM | POA: Insufficient documentation

## 2016-12-12 DIAGNOSIS — Z95810 Presence of automatic (implantable) cardiac defibrillator: Secondary | ICD-10-CM | POA: Insufficient documentation

## 2016-12-12 DIAGNOSIS — Z87891 Personal history of nicotine dependence: Secondary | ICD-10-CM | POA: Diagnosis not present

## 2016-12-12 DIAGNOSIS — K5730 Diverticulosis of large intestine without perforation or abscess without bleeding: Secondary | ICD-10-CM | POA: Insufficient documentation

## 2016-12-12 DIAGNOSIS — I447 Left bundle-branch block, unspecified: Secondary | ICD-10-CM | POA: Diagnosis not present

## 2016-12-12 HISTORY — PX: COLONOSCOPY WITH PROPOFOL: SHX5780

## 2016-12-12 LAB — POCT I-STAT 4, (NA,K, GLUC, HGB,HCT)
Glucose, Bld: 152 mg/dL — ABNORMAL HIGH (ref 65–99)
HCT: 35 % — ABNORMAL LOW (ref 36.0–46.0)
Hemoglobin: 11.9 g/dL — ABNORMAL LOW (ref 12.0–15.0)
Potassium: 3.6 mmol/L (ref 3.5–5.1)
Sodium: 145 mmol/L (ref 135–145)

## 2016-12-12 SURGERY — COLONOSCOPY WITH PROPOFOL
Anesthesia: Monitor Anesthesia Care

## 2016-12-12 MED ORDER — PROPOFOL 500 MG/50ML IV EMUL
INTRAVENOUS | Status: DC | PRN
  Administered 2016-12-12: 150 ug/kg/min via INTRAVENOUS

## 2016-12-12 MED ORDER — SODIUM CHLORIDE 0.9 % IV SOLN
INTRAVENOUS | Status: DC | PRN
  Administered 2016-12-12: 13:00:00 via INTRAVENOUS

## 2016-12-12 MED ORDER — PROPOFOL 10 MG/ML IV BOLUS
INTRAVENOUS | Status: AC
  Filled 2016-12-12: qty 60

## 2016-12-12 MED ORDER — LIDOCAINE HCL (CARDIAC) 20 MG/ML IV SOLN
INTRAVENOUS | Status: DC | PRN
  Administered 2016-12-12: 60 mg via INTRAVENOUS

## 2016-12-12 MED ORDER — SODIUM CHLORIDE 0.9 % IV SOLN
INTRAVENOUS | Status: DC
  Administered 2016-12-12: 500 mL via INTRAVENOUS

## 2016-12-12 MED ORDER — LIDOCAINE 2% (20 MG/ML) 5 ML SYRINGE
INTRAMUSCULAR | Status: AC
  Filled 2016-12-12: qty 5

## 2016-12-12 MED ORDER — PROPOFOL 500 MG/50ML IV EMUL
INTRAVENOUS | Status: DC | PRN
Start: 2016-12-12 — End: 2016-12-12
  Administered 2016-12-12: 100 mg via INTRAVENOUS

## 2016-12-12 SURGICAL SUPPLY — 22 items

## 2016-12-12 NOTE — Anesthesia Preprocedure Evaluation (Addendum)
Anesthesia Evaluation  Patient identified by MRN, date of birth, ID band Patient awake    Reviewed: Allergy & Precautions, NPO status , Patient's Chart, lab work & pertinent test results  Airway Mallampati: II  TM Distance: >3 FB Neck ROM: Full    Dental  (+) Missing,    Pulmonary asthma , former smoker,    Pulmonary exam normal breath sounds clear to auscultation       Cardiovascular hypertension, + CAD and +CHF  Normal cardiovascular exam+ Cardiac Defibrillator  Rhythm:Regular Rate:Normal  ECG: ST, rate 100. RBBB  ECHO: LV EF: 45% -   50%   Neuro/Psych negative neurological ROS  negative psych ROS   GI/Hepatic negative GI ROS, Neg liver ROS,   Endo/Other  diabetes  Renal/GU negative Renal ROS     Musculoskeletal negative musculoskeletal ROS (+)   Abdominal (+) + obese,   Peds  Hematology  (+) anemia ,   Anesthesia Other Findings   Reproductive/Obstetrics                           Anesthesia Physical Anesthesia Plan  ASA: III  Anesthesia Plan: MAC   Post-op Pain Management:    Induction: Intravenous  PONV Risk Score and Plan: 2  Airway Management Planned: Natural Airway  Additional Equipment:   Intra-op Plan:   Post-operative Plan:   Informed Consent: I have reviewed the patients History and Physical, chart, labs and discussed the procedure including the risks, benefits and alternatives for the proposed anesthesia with the patient or authorized representative who has indicated his/her understanding and acceptance.   Dental advisory given  Plan Discussed with: CRNA  Anesthesia Plan Comments:         Anesthesia Quick Evaluation

## 2016-12-12 NOTE — Discharge Instructions (Signed)

## 2016-12-12 NOTE — H&P (Signed)
[name] HPI: At this time the patient denies any problems with nausea, vomiting, fevers, chills, abdominal pain, diarrhea, constipation, hematochezia, melena, GERD, or dysphagia. The patient denies any known family history of colon cancers. No complaints of chest pain, SOB, MI, or sleep apnea. The patient's screening colonoscopy in 12/30/2011 was significant for a rectal adenoma. Two other polyps were removed, but they were not true adenomas. Since the last visit she underwent placement of an AICD/pacemaker placed on 10/2012 (Medtronic CRT-D). Her most recent EF on 07/15/2016 was 45-50%.   Past Medical History:  Diagnosis Date  . Anemia    a. Noted on 07/2012 labs, instructed to f/u PCP.  Marland Kitchen Arthritis    "joints" (11/18/2012)  . Automatic implantable cardioverter-defibrillator in situ   . CAD (coronary artery disease), native coronary artery    a. Nonobstructive by cath 02/2012 (done because of low EF).  . Chronic bronchitis ([place])    "~ every other year" (11/18/2012)  . Chronic combined systolic and diastolic CHF (congestive heart failure) ([place])    a. 03/05/12 echo:  LVEF 20-25%, moderate LVH , inferior and basal to mid septal akinesis, anterior moderate to severe hypokinesis and grade 2 diastolic dysfunction. b. EF 07/2012: EF still 25% (unclear medication compliance).  . Chronic lower back pain   . Headache(784.0)    "often; maybe not daily" (11/18/2012)  . High cholesterol   . History of noncompliance with medical treatment   . Hypertension   . LBBB (left bundle branch block)   . Orthopnea   . Tobacco abuse   . Type II diabetes mellitus ([place])     Past Surgical History:  Procedure Laterality Date  . BI-VENTRICULAR IMPLANTABLE CARDIOVERTER DEFIBRILLATOR  (CRT-D)  11/18/2012  . BI-VENTRICULAR IMPLANTABLE CARDIOVERTER DEFIBRILLATOR  (CRT-D) N/A 11/18/2012   Performed by Evans Lance, MD at [facility] CATH LAB  . CARDIAC CATHETERIZATION  03/04/12   nonobstructive CAD, elevated LVEDP and  tortuous vessels suggestive of long-standing hypertension  . JOINT REPLACEMENT     Bilateral hip and right knee  . LEFT HEART CATH N/A 03/05/2012   Performed by Larey Dresser, MD at [facility] CATH LAB    Family History  Problem Relation Age of Onset  . Heart disease Neg Hx     Social History:  reports that she quit smoking about 4 years ago. Her smoking use included cigarettes. She started smoking about 44 years ago. She has a 40.00 pack-year smoking history. she has never used smokeless tobacco. She reports that she does not drink alcohol or use drugs.  Allergies:  Allergies  Allergen Reactions  . Morphine And Related Nausea And Vomiting and Other (See Comments)    Severe nausea    Medications: Scheduled: Continuous:  No results found for this or any previous visit (from the past 24 hour(s)).   No results found.  ROS:  As stated above in the HPI otherwise negative.  There were no vitals taken for this visit.    PE: Gen: NAD, Alert and Oriented HEENT:  [place]/AT, EOMI Neck: Supple, no LAD Lungs: CTA Bilaterally CV: RRR without M/G/R ABM: Soft, NTND, +BS Ext: No C/C/E  Assessment/Plan: 1) Screening colonoscopy.  Letisia Schwalb D 12/12/2016, 12:16 PM

## 2016-12-12 NOTE — Transfer of Care (Signed)
Immediate Anesthesia Transfer of Care Note  Patient: [name]  Procedure(s) Performed: COLONOSCOPY WITH PROPOFOL (N/A )  Patient Location: PACU and Endoscopy Unit  Anesthesia Type:MAC  Level of Consciousness: awake, alert  and oriented  Airway & Oxygen Therapy: Patient connected to face mask oxygen  Post-op Assessment: Report given to RN and Post -op Vital signs reviewed and stable  Post vital signs: Reviewed and stable  Last Vitals:  Vitals:   12/12/16 1308  BP: (!) 165/73  Pulse: 74  Resp: (!) 22  Temp: 37.1 C  SpO2: 100%    Last Pain:  Vitals:   12/12/16 1308  TempSrc: Oral         Complications: No apparent anesthesia complications

## 2016-12-12 NOTE — Op Note (Signed)
[facility] Patient Name: [name] Procedure Date: 12/12/2016 MRN: [id number] Attending MD: Carol Ada , MD Date of Birth: 09/09/1950 CSN: [id number] Age: 66 Admit Type: Outpatient Procedure:                Colonoscopy Indications:              Screening for colorectal malignant neoplasm Providers:                Carol Ada, MD, Cleda Daub, RN, Cherylynn Ridges,                            Technician, Gaston Islam, CRNA Referring MD:              Medicines:                Propofol per Anesthesia Complications:            No immediate complications. Estimated Blood Loss:     Estimated blood loss: none. Procedure:                Pre-Anesthesia Assessment:                           - Prior to the procedure, a History and Physical                            was performed, and patient medications and                            allergies were reviewed. The patient's tolerance of                            previous anesthesia was also reviewed. The risks                            and benefits of the procedure and the sedation                            options and risks were discussed with the patient.                            All questions were answered, and informed consent                            was obtained. Prior Anticoagulants: The patient has                            taken no previous anticoagulant or antiplatelet                            agents. ASA Grade Assessment: III - A patient with                            severe systemic disease. After reviewing the risks  and benefits, the patient was deemed in                            satisfactory condition to undergo the procedure.                           - Sedation was administered by an anesthesia                            professional. Deep sedation was attained.                           After obtaining informed consent, the colonoscope                            was  passed under direct vision. Throughout the                            procedure, the patient's blood pressure, pulse, and                            oxygen saturations were monitored continuously. The                            EC-[id number] ([id number]) scope was introduced through                            the anus and advanced to the the cecum, identified                            by appendiceal orifice and ileocecal valve. The                            colonoscopy was performed without difficulty. The                            patient tolerated the procedure well. The quality                            of the bowel preparation was excellent. The                            ileocecal valve, appendiceal orifice, and rectum                            were photographed. Scope In: 1:33:41 PM Scope Out: 1:49:09 PM Scope Withdrawal Time: 0 hours 10 minutes 43 seconds  Total Procedure Duration: 0 hours 15 minutes 28 seconds  Findings:      A 3 mm polyp was found in the transverse colon. The polyp was sessile.       The polyp was removed with a cold snare. Resection and retrieval were       complete.      Two large-mouthed diverticula were found in the transverse colon. Impression:               -  One 3 mm polyp in the transverse colon, removed                            with a cold snare. Resected and retrieved.                           - Diverticulosis in the transverse colon. Moderate Sedation:      N/A- Per Anesthesia Care Recommendation:           - Patient has a contact number available for                            emergencies. The signs and symptoms of potential                            delayed complications were discussed with the                            patient. Return to normal activities tomorrow.                            Written discharge instructions were provided to the                            patient.                           - Resume previous diet.                            - Continue present medications.                           - Await pathology results.                           - Repeat colonoscopy in 5-10 years for surveillance. Procedure Code(s):        --- Professional ---                           [phone], Colonoscopy, flexible; with removal of                            tumor(s), polyp(s), or other lesion(s) by snare                            technique Diagnosis Code(s):        --- Professional ---                           D12.3, Benign neoplasm of transverse colon (hepatic                            flexure or splenic flexure)  Z12.11, Encounter for screening for malignant                            neoplasm of colon                           K57.30, Diverticulosis of large intestine without                            perforation or abscess without bleeding CPT copyright 2016 [employer]. All rights reserved. The codes documented in this report are preliminary and upon coder review may  be revised to meet current compliance requirements. Carol Ada, MD Carol Ada, MD 12/12/2016 1:52:11 PM This report has been signed electronically. Number of Addenda: 0

## 2016-12-15 ENCOUNTER — Encounter (HOSPITAL_COMMUNITY): Payer: Self-pay | Admitting: Gastroenterology

## 2016-12-15 NOTE — Anesthesia Postprocedure Evaluation (Signed)
Anesthesia Post Note  Patient: [name]  Procedure(s) Performed: COLONOSCOPY WITH PROPOFOL (N/A )     Patient location during evaluation: PACU Anesthesia Type: MAC Level of consciousness: awake and alert Pain management: pain level controlled Vital Signs Assessment: post-procedure vital signs reviewed and stable Respiratory status: spontaneous breathing, nonlabored ventilation, respiratory function stable and patient connected to nasal cannula oxygen Cardiovascular status: stable and blood pressure returned to baseline Postop Assessment: no apparent nausea or vomiting Anesthetic complications: no    Last Vitals:  Vitals:   12/12/16 1410 12/12/16 1420  BP: (!) 191/84 (!) 180/79  Pulse: 67 65  Resp: 19 18  Temp:    SpO2: 100% 100%    Last Pain:  Vitals:   12/12/16 1308  TempSrc: Oral                 [name] P Kimyatta Lecy

## 2016-12-16 ENCOUNTER — Encounter (HOSPITAL_COMMUNITY): Payer: Self-pay

## 2016-12-16 ENCOUNTER — Ambulatory Visit (HOSPITAL_COMMUNITY)
Admission: RE | Admit: 2016-12-16 | Discharge: 2016-12-16 | Disposition: A | Discharge status: Home / Self Care | Payer: Medicare Other | Source: Ambulatory Visit | Attending: Internal Medicine | Admitting: Internal Medicine

## 2016-12-16 VITALS — BP 168/90 | HR 90 | Wt 231.4 lb

## 2016-12-16 DIAGNOSIS — I480 Paroxysmal atrial fibrillation: Secondary | ICD-10-CM | POA: Diagnosis not present

## 2016-12-16 DIAGNOSIS — Z79899 Other long term (current) drug therapy: Secondary | ICD-10-CM | POA: Insufficient documentation

## 2016-12-16 DIAGNOSIS — I5022 Chronic systolic (congestive) heart failure: Secondary | ICD-10-CM | POA: Insufficient documentation

## 2016-12-16 DIAGNOSIS — I447 Left bundle-branch block, unspecified: Secondary | ICD-10-CM | POA: Diagnosis not present

## 2016-12-16 DIAGNOSIS — Z96643 Presence of artificial hip joint, bilateral: Secondary | ICD-10-CM | POA: Diagnosis not present

## 2016-12-16 DIAGNOSIS — Z7901 Long term (current) use of anticoagulants: Secondary | ICD-10-CM | POA: Insufficient documentation

## 2016-12-16 DIAGNOSIS — I110 Hypertensive heart disease with heart failure: Secondary | ICD-10-CM | POA: Insufficient documentation

## 2016-12-16 DIAGNOSIS — I428 Other cardiomyopathies: Secondary | ICD-10-CM | POA: Diagnosis not present

## 2016-12-16 DIAGNOSIS — I10 Essential (primary) hypertension: Secondary | ICD-10-CM

## 2016-12-16 DIAGNOSIS — Z95810 Presence of automatic (implantable) cardiac defibrillator: Secondary | ICD-10-CM | POA: Insufficient documentation

## 2016-12-16 DIAGNOSIS — I2510 Atherosclerotic heart disease of native coronary artery without angina pectoris: Secondary | ICD-10-CM | POA: Diagnosis not present

## 2016-12-16 DIAGNOSIS — E785 Hyperlipidemia, unspecified: Secondary | ICD-10-CM | POA: Insufficient documentation

## 2016-12-16 DIAGNOSIS — E119 Type 2 diabetes mellitus without complications: Secondary | ICD-10-CM | POA: Insufficient documentation

## 2016-12-16 DIAGNOSIS — Z794 Long term (current) use of insulin: Secondary | ICD-10-CM | POA: Insufficient documentation

## 2016-12-16 DIAGNOSIS — Z87891 Personal history of nicotine dependence: Secondary | ICD-10-CM | POA: Diagnosis not present

## 2016-12-16 DIAGNOSIS — Z96651 Presence of right artificial knee joint: Secondary | ICD-10-CM | POA: Insufficient documentation

## 2016-12-16 DIAGNOSIS — J449 Chronic obstructive pulmonary disease, unspecified: Secondary | ICD-10-CM | POA: Insufficient documentation

## 2016-12-16 MED ORDER — CARVEDILOL 12.5 MG PO TABS: 18.7500 mg | ORAL_TABLET | Freq: Two times a day (BID) | ORAL | 3 refills | Status: DC

## 2016-12-16 NOTE — Patient Instructions (Addendum)
INCREASE Carvedilol to 18.75 mg (1.5 Tablets) Twice Daily.  Follow up in 2 Months:  February 17, 2017 @ 11:00 AM

## 2016-12-16 NOTE — Progress Notes (Signed)
Patient ID: [name], female   DOB: 1950-07-26, 66 y.o.   MRN: [id number]   ADVANCED HF CLINIC NOTE  PCP: Dr. Alyson Ingles Cardiology: Dr Aundra Dubin  3 yo with history of nonischemic cardiomyopathy.  She was admitted with CHF exacerbation in 02/2012.  EF 20-25% on echo, LHC with nonobstructive CAD.  She was started on cardiac meds and discharged.  In 07/2012, she was admitted again with CHF exacerbation.  She had run out of Lasix.  She was taking her other heart medications as ordered, however.  She was diuresed and discharged. She had a chronic LBBB, and Medtronic CRT-D device was placed in 10/14.  She was admitted in 6/16 with hypertensive emergency and CHF exacerbation.   Also of note, she had PFTs in 9/16 showing restrictive spirometry with low lung volume and DLCO.  She was supposed to get a high resolution CT to evaluate for interstitial lung disease but never had the study.     Admitted March 2018 with urosepsis--> E Coli Bacteremia. Completed antibiotic course. EF was down from previous to 30-35%. Also had atrial fibrillation so she was loaded on amiodarone. Placed on eliquis. Discharge weight was 208 pounds.  She is now off amiodarone.   Today she returns for HF follow up. Complaining of intermittent dizziness since she had colonoscopy. Last visit SBP elevated but she had not been taking meds. Denies fever or chills. Denies SOB/Orthopnea. No bleeding problems. Not weighing.  Taking all medications. Medications are prepared by HF Paramedicine.   Labs (2/14): SPEP negative, UPEP negative, HIV negative  PMH: 1. HTN 2. Type II diabetes 3. Nonischemic cardiomyopathy: ? Due to HTN versus LBBB CMP.  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  Echo (2/14) with EF 20-25%.  Echo (7/14) with EF 25%, diffuse hypokinesis.  HIV, SPEP, UPEP negative.  Has LBBB. CRT-D 10/2012 (Medtronic).  Echo (4/15) with EF 45-50%, mild diffuse hypokinesis, PA systolic pressure 38 mmHg.  Echo (6/16) with EF 40-45%, mild LVH,  septal and inferior hypokinesis.   - Echo (3/18): EF 30-35%. Grade 1 DD - Echo (6/18): EF 45-50%, moderate LVH, normal RV size with mildly decreased systolic function.  4. Chronic LBBB 5. Right TKR 6. Bilateral THR.  7. Hyperlipidemia 8. ?COPD: Has oxygen for use with exertion.  - PFTs (9/16) with FEV1 77%, FVC 76%, ratio 101%, TLC 63%, DLCO 41% => moderate restrictive deficit  9. Atrial fibrillation: Paroxysmal.   SH: Prior smoker, quit 2/14.  Never drank ETOH.  No drugs. Lives with son.   FH: Mother with "heart trouble."   ROS: All systems reviewed and negative except as per HPI.   Current Outpatient Medications  Medication Sig Dispense Refill  . acetaminophen (TYLENOL) 500 MG tablet Take 1 tablet (500 mg total) by mouth every 6 (six) hours as needed. 30 tablet 0  . albuterol (PROVENTIL HFA;VENTOLIN HFA) 108 (90 Base) MCG/ACT inhaler Inhale 2 puffs into the lungs every 6 (six) hours as needed for wheezing or shortness of breath. 18 g 0  . apixaban (ELIQUIS) 5 MG TABS tablet Take 1 tablet (5 mg total) by mouth 2 (two) times daily. 60 tablet 3  . atorvastatin (LIPITOR) 40 MG tablet Take 1 tablet (40 mg total) by mouth daily. 30 tablet 3  . carvedilol (COREG) 12.5 MG tablet Take 1 tablet (12.5 mg total) by mouth 2 (two) times daily with a meal. 180 tablet 3  . ELIQUIS 5 MG TABS tablet TAKE 1 TABLET(5 MG) BY MOUTH TWICE DAILY 60 tablet  3  . furosemide (LASIX) 40 MG tablet Take 1 tablet (40 mg total) by mouth 2 (two) times daily. 180 tablet 3  . glucose blood (FREESTYLE TEST STRIPS) test strip Use as instructed 100 each 0  . glucose monitoring kit (FREESTYLE) monitoring kit 1 each by Does not apply route 4 (four) times daily - after meals and at bedtime. 1 month Diabetic Testing Supplies for QAC-QHS accuchecks. 1 each 1  . insulin aspart (NOVOLOG FLEXPEN) 100 UNIT/ML FlexPen 0-20 Units, Subcutaneous, 3 times daily with meals CBG < 70: implement hypoglycemia protocol CBG 70 - 120: 0  units CBG 121 - 150: 3 units CBG 151 - 200: 4 units CBG 201 - 250: 7 units CBG 251 - 300: 11 units CBG 301 - 350: 15 units CBG 351 - 400: 20 units CBG > 400: call MD 15 mL 0  . Insulin Glargine (LANTUS SOLOSTAR) 100 UNIT/ML Solostar Pen Inject 50 Units into the skin daily at 10 pm.    . Insulin Pen Needle 32G X 8 MM MISC Use as directed 100 each 0  . isosorbide-hydrALAZINE (BIDIL) 20-37.5 MG tablet Take 2 tablets by mouth 3 (three) times daily. 180 tablet 6  . Lancets (FREESTYLE) lancets Use as instructed 100 each 0  . metFORMIN (GLUCOPHAGE) 1000 MG tablet TAKE 1 TABLET(1000 MG) BY MOUTH TWICE DAILY WITH A MEAL 60 tablet 0  . naproxen sodium (ALEVE) 220 MG tablet Take 220 mg daily as needed by mouth (pain).    Marland Kitchen oxyCODONE-acetaminophen (PERCOCET/ROXICET) 5-325 MG tablet Take 1 tablet by mouth every 6 (six) hours as needed for severe pain. 8 tablet 0  . potassium chloride 20 MEQ TBCR Take 40 mEq by mouth daily. 60 tablet 3  . sacubitril-valsartan (ENTRESTO) 97-103 MG Take 1 tablet by mouth 2 (two) times daily. 60 tablet 6  . spironolactone (ALDACTONE) 25 MG tablet Take 1 tablet (25 mg total) by mouth daily. 30 tablet 3  . spironolactone (ALDACTONE) 25 MG tablet TAKE 1 TABLET(25 MG) BY MOUTH DAILY 30 tablet 3   No current facility-administered medications for this encounter.     Vitals:   12/16/16 1056  BP: (!) 168/90  Pulse: 90  SpO2: 96%  Weight: 231 lb 6.4 oz (105 kg)   Filed Weights   12/16/16 1056  Weight: 231 lb 6.4 oz (105 kg)   General:  Appears fatigued. No resp difficulty HEENT: normal Neck: supple. no JVD. Carotids 2+ bilat; no bruits. No lymphadenopathy or thryomegaly appreciated. Cor: PMI nondisplaced. Regular rate & rhythm. No rubs, gallops or murmurs. Lungs: clear Abdomen: obese, soft, nontender, nondistended. No hepatosplenomegaly. No bruits or masses. Good bowel sounds. Extremities: no cyanosis, clubbing, rash, edema Neuro: alert & orientedx3, cranial nerves  grossly intact. moves all 4 extremities w/o difficulty. Affect pleasant   Assessment/Plan: 1. Chronic systolic CHF: Nonischemic cardiomyopathy, HTN. S/P CRT-D (Medtronic). Echo 06/2016 with improved EF 45-50%. - NYHA II. Dizzy today. She is not orthostatic.  - Volume status stable. Continue lasix 40 mg twice a day.  - Increase carvedilol to 18.75 mg twice a day.  -Continue current  entresto, bidil, and spiro.   2. Hyperlipidemia:  - Continue statin.    3. HTN:  Elevated. As above increased carvedilol.   4. PAF: -  Regular pulse.  Continue Eliquis for anticoagulation.    Follow up in 2 months. I personally called Dee, HF Paramedic to discuss med changes.      Darrick Grinder, NP  11:19 AM  12/16/2016

## 2016-12-17 ENCOUNTER — Encounter (HOSPITAL_COMMUNITY): Payer: Self-pay

## 2016-12-17 ENCOUNTER — Other Ambulatory Visit (HOSPITAL_COMMUNITY): Payer: Self-pay

## 2016-12-17 NOTE — Progress Notes (Signed)
Paramedicine Encounter    Patient ID: [name], female    DOB: 11/21/1950, 66 y.o.   MRN: [id number]    Patient Care Team: McKenzie, [place], MD as PCP - General (Family Medicine)  Patient Active Problem List   Diagnosis Date Noted  . Paroxysmal A-fib (HCC)   . Biventricular automatic implantable cardioverter defibrillator in situ   . Sepsis (HCC) 04/20/2016  . UTI (urinary tract infection) 04/20/2016  . Dyspnea 07/19/2015  . Interstitial lung disease (HCC) 11/20/2014  . SIRS (systemic inflammatory response syndrome) (HCC) 02/03/2014  . IDDM (insulin dependent diabetes mellitus) (HCC) 02/03/2014  . Tachycardia 06/14/2013  . Chronic systolic CHF (congestive heart failure) (HCC) 09/29/2012  . Hypoxia 03/06/2012  . Hypertensive heart disease 03/06/2012  . Nonischemic cardiomyopathy (HCC) 03/06/2012  . Tobacco abuse 03/06/2012  . Type 2 diabetes mellitus (HCC) 03/06/2012  . Hypertension 03/06/2012  . CAD (coronary artery disease), native coronary artery 03/06/2012  . Acute on chronic combined systolic and diastolic heart failure (HCC) 03/06/2012  . Hypokalemia 03/06/2012  . Acute bronchitis 02/01/2009  . Sleep apnea 02/01/2009  . Chest pain 02/01/2009    Current Outpatient Medications:  .  apixaban (ELIQUIS) 5 MG TABS tablet, Take 1 tablet (5 mg total) by mouth 2 (two) times daily., Disp: 60 tablet, Rfl: 3 .  atorvastatin (LIPITOR) 40 MG tablet, Take 1 tablet (40 mg total) by mouth daily., Disp: 30 tablet, Rfl: 3 .  carvedilol (COREG) 12.5 MG tablet, Take 1.5 tablets (18.75 mg total) by mouth 2 (two) times daily with a meal., Disp: 270 tablet, Rfl: 3 .  ELIQUIS 5 MG TABS tablet, TAKE 1 TABLET(5 MG) BY MOUTH TWICE DAILY, Disp: 60 tablet, Rfl: 3 .  furosemide (LASIX) 40 MG tablet, Take 1 tablet (40 mg total) by mouth 2 (two) times daily., Disp: 180 tablet, Rfl: 3 .  isosorbide-hydrALAZINE (BIDIL) 20-37.5 MG tablet, Take 2 tablets by mouth 3 (three) times daily., Disp: 180  tablet, Rfl: 6 .  metFORMIN (GLUCOPHAGE) 1000 MG tablet, TAKE 1 TABLET(1000 MG) BY MOUTH TWICE DAILY WITH A MEAL, Disp: 60 tablet, Rfl: 0 .  potassium chloride 20 MEQ TBCR, Take 40 mEq by mouth daily., Disp: 60 tablet, Rfl: 3 .  sacubitril-valsartan (ENTRESTO) 97-103 MG, Take 1 tablet by mouth 2 (two) times daily., Disp: 60 tablet, Rfl: 6 .  spironolactone (ALDACTONE) 25 MG tablet, Take 1 tablet (25 mg total) by mouth daily., Disp: 30 tablet, Rfl: 3 .  acetaminophen (TYLENOL) 500 MG tablet, Take 1 tablet (500 mg total) by mouth every 6 (six) hours as needed., Disp: 30 tablet, Rfl: 0 .  albuterol (PROVENTIL HFA;VENTOLIN HFA) 108 (90 Base) MCG/ACT inhaler, Inhale 2 puffs into the lungs every 6 (six) hours as needed for wheezing or shortness of breath., Disp: 18 g, Rfl: 0 .  glucose blood (FREESTYLE TEST STRIPS) test strip, Use as instructed, Disp: 100 each, Rfl: 0 .  glucose monitoring kit (FREESTYLE) monitoring kit, 1 each by Does not apply route 4 (four) times daily - after meals and at bedtime. 1 month Diabetic Testing Supplies for QAC-QHS accuchecks., Disp: 1 each, Rfl: 1 .  insulin aspart (NOVOLOG FLEXPEN) 100 UNIT/ML FlexPen, 0-20 Units, Subcutaneous, 3 times daily with meals CBG < 70: implement hypoglycemia protocol CBG 70 - 120: 0 units CBG 121 - 150: 3 units CBG 151 - 200: 4 units CBG 201 - 250: 7 units CBG 251 - 300: 11 units CBG 301 - 350: 15 units CBG 351 - 400: 20   units CBG > 400: call MD, Disp: 15 mL, Rfl: 0 .  Insulin Glargine (LANTUS SOLOSTAR) 100 UNIT/ML Solostar Pen, Inject 50 Units into the skin daily at 10 pm., Disp: , Rfl:  .  Insulin Pen Needle 32G X 8 MM MISC, Use as directed, Disp: 100 each, Rfl: 0 .  Lancets (FREESTYLE) lancets, Use as instructed, Disp: 100 each, Rfl: 0 .  naproxen sodium (ALEVE) 220 MG tablet, Take 220 mg daily as needed by mouth (pain)., Disp: , Rfl:  .  oxyCODONE-acetaminophen (PERCOCET/ROXICET) 5-325 MG tablet, Take 1 tablet by mouth every 6 (six) hours as  needed for severe pain., Disp: 8 tablet, Rfl: 0 .  spironolactone (ALDACTONE) 25 MG tablet, TAKE 1 TABLET(25 MG) BY MOUTH DAILY, Disp: 30 tablet, Rfl: 3 Allergies  Allergen Reactions  . Morphine And Related Nausea And Vomiting and Other (See Comments)    Severe nausea     Social History   Socioeconomic History  . Marital status: Single    Spouse name: Not on file  . Number of children: Not on file  . Years of education: Not on file  . Highest education level: Not on file  Social Needs  . Financial resource strain: Not on file  . Food insecurity - worry: Not on file  . Food insecurity - inability: Not on file  . Transportation needs - medical: Not on file  . Transportation needs - non-medical: Not on file  Occupational History  . Not on file  Tobacco Use  . Smoking status: Former Smoker    Packs/day: 1.00    Years: 40.00    Pack years: 40.00    Types: Cigarettes    Start date: 06/23/1972    Last attempt to quit: 03/26/2012    Years since quitting: 4.7  . Smokeless tobacco: Never Used  Substance and Sexual Activity  . Alcohol use: No  . Drug use: No  . Sexual activity: Yes  Other Topics Concern  . Not on file  Social History Narrative  . Not on file    Physical Exam  Pulmonary/Chest: No respiratory distress. She has no wheezes. She has no rales.  Abdominal: She exhibits no distension. There is no tenderness. There is no guarding.  Musculoskeletal: She exhibits no edema.  Skin: Skin is warm and dry. She is not diaphoretic.        Future Appointments  Date Time Provider [place]  02/17/2017 11:00 AM [facility] PA/NP [facility] None    ATF pt CAO x4 sitting on the side of her bed c/o "not feeling good". Pt stated that she think that she's still "groogy from the colonoscopy".  I advised pt her symptoms maybe from something else due to the pain meds should be gone since the procedure was Friday.  Pt has been taking all of her medications and hasn't missed a dose.   Pt agreed to call her pcp if she continued feeling bad.  Pt's vitals noted.  rx bottles verified and pill box (2) refilled.   BP (!) 150/82 (BP Location: Right Arm, Patient Position: Sitting, Cuff Size: Normal)   Pulse 97   Resp 16   SpO2 98%      Jozalyn Baglio, EMT Paramedic 12/17/2016    ACTION: Home visit completed

## 2016-12-25 ENCOUNTER — Other Ambulatory Visit (HOSPITAL_COMMUNITY): Payer: Self-pay

## 2016-12-25 NOTE — Progress Notes (Signed)
Paramedicine Encounter    Patient ID: [name], female    DOB: [birth year], 66 y.o.   MRN: [id number]   Patient Care Team: [name], MD as PCP - General (Family Medicine)  Patient Active Problem List   Diagnosis Date Noted  . Paroxysmal A-fib ([place])   . Biventricular automatic implantable cardioverter defibrillator in situ   . Sepsis ([place]) 04/20/2016  . UTI (urinary tract infection) 04/20/2016  . Dyspnea 07/19/2015  . Interstitial lung disease ([place]) 11/20/2014  . SIRS (systemic inflammatory response syndrome) ([place]) 02/03/2014  . IDDM (insulin dependent diabetes mellitus) ([place]) 02/03/2014  . Tachycardia 06/14/2013  . Chronic systolic CHF (congestive heart failure) ([place]) 09/29/2012  . Hypoxia 03/06/2012  . Hypertensive heart disease 03/06/2012  . Nonischemic cardiomyopathy ([place]) 03/06/2012  . Tobacco abuse 03/06/2012  . Type 2 diabetes mellitus ([place]) 03/06/2012  . Hypertension 03/06/2012  . CAD (coronary artery disease), native coronary artery 03/06/2012  . Acute on chronic combined systolic and diastolic heart failure ([place]) 03/06/2012  . Hypokalemia 03/06/2012  . Acute bronchitis 02/01/2009  . Sleep apnea 02/01/2009  . Chest pain 02/01/2009    Current Outpatient Medications:  .  acetaminophen (TYLENOL) 500 MG tablet, Take 1 tablet (500 mg total) by mouth every 6 (six) hours as needed., Disp: 30 tablet, Rfl: 0 .  albuterol (PROVENTIL HFA;VENTOLIN HFA) 108 (90 Base) MCG/ACT inhaler, Inhale 2 puffs into the lungs every 6 (six) hours as needed for wheezing or shortness of breath., Disp: 18 g, Rfl: 0 .  apixaban (ELIQUIS) 5 MG TABS tablet, Take 1 tablet (5 mg total) by mouth 2 (two) times daily., Disp: 60 tablet, Rfl: 3 .  atorvastatin (LIPITOR) 40 MG tablet, Take 1 tablet (40 mg total) by mouth daily., Disp: 30 tablet, Rfl: 3 .  carvedilol (COREG) 12.5 MG tablet, Take 1.5 tablets (18.75 mg total) by mouth 2 (two) times daily with a meal., Disp: 270 tablet, Rfl: 3 .   ELIQUIS 5 MG TABS tablet, TAKE 1 TABLET(5 MG) BY MOUTH TWICE DAILY, Disp: 60 tablet, Rfl: 3 .  furosemide (LASIX) 40 MG tablet, Take 1 tablet (40 mg total) by mouth 2 (two) times daily., Disp: 180 tablet, Rfl: 3 .  glucose blood (FREESTYLE TEST STRIPS) test strip, Use as instructed, Disp: 100 each, Rfl: 0 .  glucose monitoring kit (FREESTYLE) monitoring kit, 1 each by Does not apply route 4 (four) times daily - after meals and at bedtime. 1 month Diabetic Testing Supplies for QAC-QHS accuchecks., Disp: 1 each, Rfl: 1 .  insulin aspart (NOVOLOG FLEXPEN) 100 UNIT/ML FlexPen, 0-20 Units, Subcutaneous, 3 times daily with meals CBG < 70: implement hypoglycemia protocol CBG 70 - 120: 0 units CBG 121 - 150: 3 units CBG 151 - 200: 4 units CBG 201 - 250: 7 units CBG 251 - 300: 11 units CBG 301 - 350: 15 units CBG 351 - 400: 20 units CBG > 400: call MD, Disp: 15 mL, Rfl: 0 .  Insulin Glargine (LANTUS SOLOSTAR) 100 UNIT/ML Solostar Pen, Inject 50 Units into the skin daily at 10 pm., Disp: , Rfl:  .  Insulin Pen Needle 32G X 8 MM MISC, Use as directed, Disp: 100 each, Rfl: 0 .  isosorbide-hydrALAZINE (BIDIL) 20-37.5 MG tablet, Take 2 tablets by mouth 3 (three) times daily., Disp: 180 tablet, Rfl: 6 .  Lancets (FREESTYLE) lancets, Use as instructed, Disp: 100 each, Rfl: 0 .  metFORMIN (GLUCOPHAGE) 1000 MG tablet, TAKE 1 TABLET(1000 MG) BY MOUTH TWICE DAILY WITH A  MEAL, Disp: 60 tablet, Rfl: 0 .  naproxen sodium (ALEVE) 220 MG tablet, Take 220 mg daily as needed by mouth (pain)., Disp: , Rfl:  .  oxyCODONE-acetaminophen (PERCOCET/ROXICET) 5-325 MG tablet, Take 1 tablet by mouth every 6 (six) hours as needed for severe pain., Disp: 8 tablet, Rfl: 0 .  potassium chloride 20 MEQ TBCR, Take 40 mEq by mouth daily., Disp: 60 tablet, Rfl: 3 .  sacubitril-valsartan (ENTRESTO) 97-103 MG, Take 1 tablet by mouth 2 (two) times daily., Disp: 60 tablet, Rfl: 6 .  spironolactone (ALDACTONE) 25 MG tablet, Take 1 tablet (25 mg  total) by mouth daily., Disp: 30 tablet, Rfl: 3 .  spironolactone (ALDACTONE) 25 MG tablet, TAKE 1 TABLET(25 MG) BY MOUTH DAILY, Disp: 30 tablet, Rfl: 3 Allergies  Allergen Reactions  . Morphine And Related Nausea And Vomiting and Other (See Comments)    Severe nausea      Social History   Socioeconomic History  . Marital status: Single    Spouse name: Not on file  . Number of children: Not on file  . Years of education: Not on file  . Highest education level: Not on file  Social Needs  . Financial resource strain: Not on file  . Food insecurity - worry: Not on file  . Food insecurity - inability: Not on file  . Transportation needs - medical: Not on file  . Transportation needs - non-medical: Not on file  Occupational History  . Not on file  Tobacco Use  . Smoking status: Former Smoker    Packs/day: 1.00    Years: 40.00    Pack years: 40.00    Types: Cigarettes    Start date: 06/23/1972    Last attempt to quit: 03/26/2012    Years since quitting: 4.7  . Smokeless tobacco: Never Used  Substance and Sexual Activity  . Alcohol use: No  . Drug use: No  . Sexual activity: Yes  Other Topics Concern  . Not on file  Social History Narrative  . Not on file    Physical Exam  Constitutional: She is oriented to person, place, and time.  Cardiovascular: Regular rhythm.  Pulmonary/Chest: Effort normal and breath sounds normal. No respiratory distress. She has no wheezes. She has no rales.  Musculoskeletal: Normal range of motion. She exhibits no edema.  Neurological: She is alert and oriented to person, place, and time.  Skin: Skin is warm.        Future Appointments  Date Time Provider [place]  02/17/2017 11:00 AM [facility] PA/NP [facility] None    There were no vitals taken for this visit.  Weight yesterday- Did not weigh Last visit weight- Did not weigh  [name] was seen at home today and reported feeling generally well. She denied feeing more SOB than  normal but she had been working in [employer] which had he sweating a lot. Her BP was low and her HR was up but she stated that she has been drinking fluids to compensate for her diuretic. She had taken the pills in her box from Sunday through Thursday morning and still have her entire second box full with the tape still on it. I found a few mistakes in the bins of the box she had been taking so I asked if she had refilled the box herself and she said that she had not. She denied missing any doses which doesn't seem to match what her pillbox shows. She was in a hurry to leave  because she was going see her grandchildren. I verified her medications and fixed the issues in her pillbox. She had not been using her home scale because she said it was not working. I asked she show me what she meant and it turned out that she was not tuning on the scale and allowing to "zero" before applying her full weight. I showed her how to properly use the scale and her weights were closer to the weights she had been getting at the clinic.   Time spent with patient: 31 minutes   [name], EMT 12/25/16  ACTION: Home visit completed Next visit planned for 1 week

## 2017-01-02 ENCOUNTER — Telehealth (HOSPITAL_COMMUNITY): Payer: Self-pay

## 2017-01-02 NOTE — Telephone Encounter (Signed)
[name] was called to confirm our weekly appointment.  She stated that she was at her sister's house, and would be there "all day".  She requested that she would like to re-schedule for next week.  Pt stated that she has enough pills in her pill box, which they should be empty.  She stated that she feels "great".   Next appointment/next wednesday

## 2017-01-07 ENCOUNTER — Telehealth (HOSPITAL_COMMUNITY): Payer: Self-pay

## 2017-01-07 NOTE — Telephone Encounter (Signed)
Pt had a scheduled [facility] visit today @ 9am.  Pt didn't answer the phone nor did she call back.  The appointment was at 9:30 am today.  I left a message and will f/u with pt later today.

## 2017-01-09 ENCOUNTER — Other Ambulatory Visit (HOSPITAL_COMMUNITY): Payer: Self-pay

## 2017-01-09 ENCOUNTER — Other Ambulatory Visit (HOSPITAL_COMMUNITY): Payer: Self-pay | Admitting: *Deleted

## 2017-01-09 MED ORDER — FUROSEMIDE 40 MG PO TABS: 40.0000 mg | ORAL_TABLET | Freq: Two times a day (BID) | ORAL | 3 refills | Status: DC

## 2017-01-09 NOTE — Progress Notes (Signed)
Paramedicine Encounter    Patient ID: [name], female    DOB: 1950/04/04, 66 y.o.   MRN: [id number]    Patient Care Team: [name], MD as PCP - General (Family Medicine)  Patient Active Problem List   Diagnosis Date Noted  . Paroxysmal A-fib ([place])   . Biventricular automatic implantable cardioverter defibrillator in situ   . Sepsis ([place]) 04/20/2016  . UTI (urinary tract infection) 04/20/2016  . Dyspnea 07/19/2015  . Interstitial lung disease ([place]) 11/20/2014  . SIRS (systemic inflammatory response syndrome) ([place]) 02/03/2014  . IDDM (insulin dependent diabetes mellitus) ([place]) 02/03/2014  . Tachycardia 06/14/2013  . Chronic systolic CHF (congestive heart failure) ([place]) 09/29/2012  . Hypoxia 03/06/2012  . Hypertensive heart disease 03/06/2012  . Nonischemic cardiomyopathy ([place]) 03/06/2012  . Tobacco abuse 03/06/2012  . Type 2 diabetes mellitus ([place]) 03/06/2012  . Hypertension 03/06/2012  . CAD (coronary artery disease), native coronary artery 03/06/2012  . Acute on chronic combined systolic and diastolic heart failure ([place]) 03/06/2012  . Hypokalemia 03/06/2012  . Acute bronchitis 02/01/2009  . Sleep apnea 02/01/2009  . Chest pain 02/01/2009    Current Outpatient Medications:  .  apixaban (ELIQUIS) 5 MG TABS tablet, Take 1 tablet (5 mg total) by mouth 2 (two) times daily., Disp: 60 tablet, Rfl: 3 .  atorvastatin (LIPITOR) 40 MG tablet, Take 1 tablet (40 mg total) by mouth daily., Disp: 30 tablet, Rfl: 3 .  carvedilol (COREG) 12.5 MG tablet, Take 1.5 tablets (18.75 mg total) by mouth 2 (two) times daily with a meal., Disp: 270 tablet, Rfl: 3 .  furosemide (LASIX) 40 MG tablet, Take 1 tablet (40 mg total) by mouth 2 (two) times daily., Disp: 180 tablet, Rfl: 3 .  glucose blood (FREESTYLE TEST STRIPS) test strip, Use as instructed, Disp: 100 each, Rfl: 0 .  glucose monitoring kit (FREESTYLE) monitoring kit, 1 each by Does not apply route 4 (four) times daily - after  meals and at bedtime. 1 month Diabetic Testing Supplies for QAC-QHS accuchecks., Disp: 1 each, Rfl: 1 .  insulin aspart (NOVOLOG FLEXPEN) 100 UNIT/ML FlexPen, 0-20 Units, Subcutaneous, 3 times daily with meals CBG < 70: implement hypoglycemia protocol CBG 70 - 120: 0 units CBG 121 - 150: 3 units CBG 151 - 200: 4 units CBG 201 - 250: 7 units CBG 251 - 300: 11 units CBG 301 - 350: 15 units CBG 351 - 400: 20 units CBG > 400: call MD, Disp: 15 mL, Rfl: 0 .  Insulin Glargine (LANTUS SOLOSTAR) 100 UNIT/ML Solostar Pen, Inject 50 Units into the skin daily at 10 pm., Disp: , Rfl:  .  Insulin Pen Needle 32G X 8 MM MISC, Use as directed, Disp: 100 each, Rfl: 0 .  isosorbide-hydrALAZINE (BIDIL) 20-37.5 MG tablet, Take 2 tablets by mouth 3 (three) times daily., Disp: 180 tablet, Rfl: 6 .  metFORMIN (GLUCOPHAGE) 1000 MG tablet, TAKE 1 TABLET(1000 MG) BY MOUTH TWICE DAILY WITH A MEAL, Disp: 60 tablet, Rfl: 0 .  potassium chloride 20 MEQ TBCR, Take 40 mEq by mouth daily., Disp: 60 tablet, Rfl: 3 .  sacubitril-valsartan (ENTRESTO) 97-103 MG, Take 1 tablet by mouth 2 (two) times daily., Disp: 60 tablet, Rfl: 6 .  spironolactone (ALDACTONE) 25 MG tablet, Take 1 tablet (25 mg total) by mouth daily., Disp: 30 tablet, Rfl: 3 .  acetaminophen (TYLENOL) 500 MG tablet, Take 1 tablet (500 mg total) by mouth every 6 (six) hours as needed., Disp: 30 tablet, Rfl: 0 .  albuterol (PROVENTIL HFA;VENTOLIN HFA) 108 (90 Base) MCG/ACT inhaler, Inhale 2 puffs into the lungs every 6 (six) hours as needed for wheezing or shortness of breath., Disp: 18 g, Rfl: 0 .  ELIQUIS 5 MG TABS tablet, TAKE 1 TABLET(5 MG) BY MOUTH TWICE DAILY, Disp: 60 tablet, Rfl: 3 .  Lancets (FREESTYLE) lancets, Use as instructed, Disp: 100 each, Rfl: 0 .  naproxen sodium (ALEVE) 220 MG tablet, Take 220 mg daily as needed by mouth (pain)., Disp: , Rfl:  .  oxyCODONE-acetaminophen (PERCOCET/ROXICET) 5-325 MG tablet, Take 1 tablet by mouth every 6 (six) hours as  needed for severe pain., Disp: 8 tablet, Rfl: 0 .  spironolactone (ALDACTONE) 25 MG tablet, TAKE 1 TABLET(25 MG) BY MOUTH DAILY, Disp: 30 tablet, Rfl: 3 Allergies  Allergen Reactions  . Morphine And Related Nausea And Vomiting and Other (See Comments)    Severe nausea     Social History   Socioeconomic History  . Marital status: Single    Spouse name: Not on file  . Number of children: Not on file  . Years of education: Not on file  . Highest education level: Not on file  Social Needs  . Financial resource strain: Not on file  . Food insecurity - worry: Not on file  . Food insecurity - inability: Not on file  . Transportation needs - medical: Not on file  . Transportation needs - non-medical: Not on file  Occupational History  . Not on file  Tobacco Use  . Smoking status: Former Smoker    Packs/day: 1.00    Years: 40.00    Pack years: 40.00    Types: Cigarettes    Start date: 06/23/1972    Last attempt to quit: 03/26/2012    Years since quitting: 4.7  . Smokeless tobacco: Never Used  Substance and Sexual Activity  . Alcohol use: No  . Drug use: No  . Sexual activity: Yes  Other Topics Concern  . Not on file  Social History Narrative  . Not on file    Physical Exam  Pulmonary/Chest: No respiratory distress. She has no wheezes. She has no rales.  Abdominal: She exhibits no distension. There is no tenderness. There is no rebound.  Musculoskeletal: She exhibits no edema.  Skin: Skin is warm and dry. She is not diaphoretic.        Future Appointments  Date Time Provider [place]  02/17/2017 11:00 AM [facility] PA/NP [facility] None    ATF pt CAO x4 sitting on the side of her bed wrapping xmas gifts.  Pt stated that she feels great and she has no complaints.  Pt stated that she has taken all of her meds besides lasix. She stated that she had been out of them for about 2 days. I found additional pills in another pill bag. Pt didn't know that she had the pills. Pt  denies sob, chest pain and dizziness. rx bottles verified and pill box (2) refilled. Pt hasn't been weighing herself, since i've our last visit.  I encouraged pt to start back weighing herself and we discussed her diet.  BP (!) 142/70   Pulse 96   Wt 236 lb 6.4 oz (107.2 kg)   SpO2 97%   BMI 37.03 kg/m   cbg 387  **rx called in: bidil  Metformin   Prabhav Faulkenberry, EMT Paramedic 01/09/2017    ACTION: Home visit completed

## 2017-01-23 ENCOUNTER — Other Ambulatory Visit (HOSPITAL_COMMUNITY): Payer: Self-pay

## 2017-01-23 NOTE — Progress Notes (Signed)
Paramedicine Encounter    Patient ID: [name], female    DOB: 09-08-1950, 66 y.o.   MRN: [id number]    Patient Care Team: [name], MD as PCP - General (Family Medicine)  Patient Active Problem List   Diagnosis Date Noted  . Paroxysmal A-fib ([place])   . Biventricular automatic implantable cardioverter defibrillator in situ   . Sepsis ([place]) 04/20/2016  . UTI (urinary tract infection) 04/20/2016  . Dyspnea 07/19/2015  . Interstitial lung disease ([place]) 11/20/2014  . SIRS (systemic inflammatory response syndrome) ([place]) 02/03/2014  . IDDM (insulin dependent diabetes mellitus) ([place]) 02/03/2014  . Tachycardia 06/14/2013  . Chronic systolic CHF (congestive heart failure) ([place]) 09/29/2012  . Hypoxia 03/06/2012  . Hypertensive heart disease 03/06/2012  . Nonischemic cardiomyopathy ([place]) 03/06/2012  . Tobacco abuse 03/06/2012  . Type 2 diabetes mellitus ([place]) 03/06/2012  . Hypertension 03/06/2012  . CAD (coronary artery disease), native coronary artery 03/06/2012  . Acute on chronic combined systolic and diastolic heart failure ([place]) 03/06/2012  . Hypokalemia 03/06/2012  . Acute bronchitis 02/01/2009  . Sleep apnea 02/01/2009  . Chest pain 02/01/2009    Current Outpatient Medications:  .  apixaban (ELIQUIS) 5 MG TABS tablet, Take 1 tablet (5 mg total) by mouth 2 (two) times daily., Disp: 60 tablet, Rfl: 3 .  atorvastatin (LIPITOR) 40 MG tablet, Take 1 tablet (40 mg total) by mouth daily., Disp: 30 tablet, Rfl: 3 .  carvedilol (COREG) 12.5 MG tablet, Take 1.5 tablets (18.75 mg total) by mouth 2 (two) times daily with a meal., Disp: 270 tablet, Rfl: 3 .  furosemide (LASIX) 40 MG tablet, Take 1 tablet (40 mg total) by mouth 2 (two) times daily., Disp: 180 tablet, Rfl: 3 .  glucose blood (FREESTYLE TEST STRIPS) test strip, Use as instructed, Disp: 100 each, Rfl: 0 .  isosorbide-hydrALAZINE (BIDIL) 20-37.5 MG tablet, Take 2 tablets by mouth 3 (three) times daily., Disp: 180  tablet, Rfl: 6 .  metFORMIN (GLUCOPHAGE) 1000 MG tablet, TAKE 1 TABLET(1000 MG) BY MOUTH TWICE DAILY WITH A MEAL, Disp: 60 tablet, Rfl: 0 .  potassium chloride 20 MEQ TBCR, Take 40 mEq by mouth daily., Disp: 60 tablet, Rfl: 3 .  sacubitril-valsartan (ENTRESTO) 97-103 MG, Take 1 tablet by mouth 2 (two) times daily., Disp: 60 tablet, Rfl: 6 .  spironolactone (ALDACTONE) 25 MG tablet, Take 1 tablet (25 mg total) by mouth daily., Disp: 30 tablet, Rfl: 3 .  acetaminophen (TYLENOL) 500 MG tablet, Take 1 tablet (500 mg total) by mouth every 6 (six) hours as needed., Disp: 30 tablet, Rfl: 0 .  albuterol (PROVENTIL HFA;VENTOLIN HFA) 108 (90 Base) MCG/ACT inhaler, Inhale 2 puffs into the lungs every 6 (six) hours as needed for wheezing or shortness of breath., Disp: 18 g, Rfl: 0 .  ELIQUIS 5 MG TABS tablet, TAKE 1 TABLET(5 MG) BY MOUTH TWICE DAILY, Disp: 60 tablet, Rfl: 3 .  glucose monitoring kit (FREESTYLE) monitoring kit, 1 each by Does not apply route 4 (four) times daily - after meals and at bedtime. 1 month Diabetic Testing Supplies for QAC-QHS accuchecks., Disp: 1 each, Rfl: 1 .  insulin aspart (NOVOLOG FLEXPEN) 100 UNIT/ML FlexPen, 0-20 Units, Subcutaneous, 3 times daily with meals CBG < 70: implement hypoglycemia protocol CBG 70 - 120: 0 units CBG 121 - 150: 3 units CBG 151 - 200: 4 units CBG 201 - 250: 7 units CBG 251 - 300: 11 units CBG 301 - 350: 15 units CBG 351 - 400: 20  units CBG > 400: call MD, Disp: 15 mL, Rfl: 0 .  Insulin Glargine (LANTUS SOLOSTAR) 100 UNIT/ML Solostar Pen, Inject 50 Units into the skin daily at 10 pm., Disp: , Rfl:  .  Insulin Pen Needle 32G X 8 MM MISC, Use as directed, Disp: 100 each, Rfl: 0 .  Lancets (FREESTYLE) lancets, Use as instructed, Disp: 100 each, Rfl: 0 .  naproxen sodium (ALEVE) 220 MG tablet, Take 220 mg daily as needed by mouth (pain)., Disp: , Rfl:  .  oxyCODONE-acetaminophen (PERCOCET/ROXICET) 5-325 MG tablet, Take 1 tablet by mouth every 6 (six) hours as  needed for severe pain., Disp: 8 tablet, Rfl: 0 .  spironolactone (ALDACTONE) 25 MG tablet, TAKE 1 TABLET(25 MG) BY MOUTH DAILY, Disp: 30 tablet, Rfl: 3 Allergies  Allergen Reactions  . Morphine And Related Nausea And Vomiting and Other (See Comments)    Severe nausea     Social History   Socioeconomic History  . Marital status: Single    Spouse name: Not on file  . Number of children: Not on file  . Years of education: Not on file  . Highest education level: Not on file  Social Needs  . Financial resource strain: Not on file  . Food insecurity - worry: Not on file  . Food insecurity - inability: Not on file  . Transportation needs - medical: Not on file  . Transportation needs - non-medical: Not on file  Occupational History  . Not on file  Tobacco Use  . Smoking status: Former Smoker    Packs/day: 1.00    Years: 40.00    Pack years: 40.00    Types: Cigarettes    Start date: 06/23/1972    Last attempt to quit: 03/26/2012    Years since quitting: 4.8  . Smokeless tobacco: Never Used  Substance and Sexual Activity  . Alcohol use: No  . Drug use: No  . Sexual activity: Yes  Other Topics Concern  . Not on file  Social History Narrative  . Not on file    Physical Exam  Pulmonary/Chest: No respiratory distress.  Abdominal: She exhibits no distension. There is no tenderness. There is no rebound.  Musculoskeletal: She exhibits no edema.  Skin: Skin is warm and dry. She is not diaphoretic.        Future Appointments  Date Time Provider [place]  02/17/2017 11:00 AM [facility] PA/NP [facility] None    ATF pt CAO x 4 c/o "not feeling well after taking her morning medications".  Pt stated that she has been "taking her medications" but the second pill box is still full.  Pt stated that she's correctly but she ate ice cream and cake yesterday.  She stated that she hasn't eaten anything today.  Pt's vitals noted and her CBG @ 531.  Pt became agitated and stated that  "the medications is making her blood sugar high".  Pt was advised to take the insulin because she stated that she hasn't taken any in a couple of days.  We discussed pts eating habits and her getting back on track with taking her medications and watching what she eats.  Pt usually cooks with Mrs. Dash per pt.  rx bottles verified and pill box refilled. I called pt's pcp about the CBG readings; no answer or message center vm.  BP 132/90 (BP Location: Left Arm, Patient Position: Sitting, Cuff Size: Large)   Pulse (!) 101   Resp 16   Wt 219 lb 6.4 oz (  99.5 kg)   SpO2 100%   BMI 34.36 kg/m   Weight yesterday-didn't weigh Last visit weight-236 (pts readings are off due to her weighing in different parts of her house which is grossly uneven)    Earnestine Shipp, EMT Paramedic 01/23/2017    ACTION: Home visit completed Next visit planned for in two weeks

## 2017-02-06 ENCOUNTER — Encounter (HOSPITAL_COMMUNITY): Payer: Self-pay

## 2017-02-06 ENCOUNTER — Other Ambulatory Visit (HOSPITAL_COMMUNITY): Payer: Self-pay

## 2017-02-06 NOTE — Progress Notes (Signed)
Paramedicine Encounter    Patient ID: [name], female    DOB: 1950/08/07, 67 y.o.   MRN: [id number]    Patient Care Team: [name], MD as PCP - General (Family Medicine)  Patient Active Problem List   Diagnosis Date Noted  . Paroxysmal A-fib ([place])   . Biventricular automatic implantable cardioverter defibrillator in situ   . Sepsis ([place]) 04/20/2016  . UTI (urinary tract infection) 04/20/2016  . Dyspnea 07/19/2015  . Interstitial lung disease ([place]) 11/20/2014  . SIRS (systemic inflammatory response syndrome) ([place]) 02/03/2014  . IDDM (insulin dependent diabetes mellitus) ([place]) 02/03/2014  . Tachycardia 06/14/2013  . Chronic systolic CHF (congestive heart failure) ([place]) 09/29/2012  . Hypoxia 03/06/2012  . Hypertensive heart disease 03/06/2012  . Nonischemic cardiomyopathy ([place]) 03/06/2012  . Tobacco abuse 03/06/2012  . Type 2 diabetes mellitus ([place]) 03/06/2012  . Hypertension 03/06/2012  . CAD (coronary artery disease), native coronary artery 03/06/2012  . Acute on chronic combined systolic and diastolic heart failure ([place]) 03/06/2012  . Hypokalemia 03/06/2012  . Acute bronchitis 02/01/2009  . Sleep apnea 02/01/2009  . Chest pain 02/01/2009    Current Outpatient Medications:  .  apixaban (ELIQUIS) 5 MG TABS tablet, Take 1 tablet (5 mg total) by mouth 2 (two) times daily., Disp: 60 tablet, Rfl: 3 .  atorvastatin (LIPITOR) 40 MG tablet, Take 1 tablet (40 mg total) by mouth daily., Disp: 30 tablet, Rfl: 3 .  carvedilol (COREG) 12.5 MG tablet, Take 1.5 tablets (18.75 mg total) by mouth 2 (two) times daily with a meal., Disp: 270 tablet, Rfl: 3 .  ELIQUIS 5 MG TABS tablet, TAKE 1 TABLET(5 MG) BY MOUTH TWICE DAILY, Disp: 60 tablet, Rfl: 3 .  furosemide (LASIX) 40 MG tablet, Take 1 tablet (40 mg total) by mouth 2 (two) times daily., Disp: 180 tablet, Rfl: 3 .  glucose blood (FREESTYLE TEST STRIPS) test strip, Use as instructed, Disp: 100 each, Rfl: 0 .  glucose  monitoring kit (FREESTYLE) monitoring kit, 1 each by Does not apply route 4 (four) times daily - after meals and at bedtime. 1 month Diabetic Testing Supplies for QAC-QHS accuchecks., Disp: 1 each, Rfl: 1 .  insulin aspart (NOVOLOG FLEXPEN) 100 UNIT/ML FlexPen, 0-20 Units, Subcutaneous, 3 times daily with meals CBG < 70: implement hypoglycemia protocol CBG 70 - 120: 0 units CBG 121 - 150: 3 units CBG 151 - 200: 4 units CBG 201 - 250: 7 units CBG 251 - 300: 11 units CBG 301 - 350: 15 units CBG 351 - 400: 20 units CBG > 400: call MD, Disp: 15 mL, Rfl: 0 .  Insulin Glargine (LANTUS SOLOSTAR) 100 UNIT/ML Solostar Pen, Inject 50 Units into the skin daily at 10 pm., Disp: , Rfl:  .  Insulin Pen Needle 32G X 8 MM MISC, Use as directed, Disp: 100 each, Rfl: 0 .  isosorbide-hydrALAZINE (BIDIL) 20-37.5 MG tablet, Take 2 tablets by mouth 3 (three) times daily., Disp: 180 tablet, Rfl: 6 .  Lancets (FREESTYLE) lancets, Use as instructed, Disp: 100 each, Rfl: 0 .  metFORMIN (GLUCOPHAGE) 1000 MG tablet, TAKE 1 TABLET(1000 MG) BY MOUTH TWICE DAILY WITH A MEAL, Disp: 60 tablet, Rfl: 0 .  potassium chloride 20 MEQ TBCR, Take 40 mEq by mouth daily., Disp: 60 tablet, Rfl: 3 .  sacubitril-valsartan (ENTRESTO) 97-103 MG, Take 1 tablet by mouth 2 (two) times daily., Disp: 60 tablet, Rfl: 6 .  spironolactone (ALDACTONE) 25 MG tablet, Take 1 tablet (25 mg total) by mouth  daily., Disp: 30 tablet, Rfl: 3 .  spironolactone (ALDACTONE) 25 MG tablet, TAKE 1 TABLET(25 MG) BY MOUTH DAILY, Disp: 30 tablet, Rfl: 3 .  acetaminophen (TYLENOL) 500 MG tablet, Take 1 tablet (500 mg total) by mouth every 6 (six) hours as needed., Disp: 30 tablet, Rfl: 0 .  albuterol (PROVENTIL HFA;VENTOLIN HFA) 108 (90 Base) MCG/ACT inhaler, Inhale 2 puffs into the lungs every 6 (six) hours as needed for wheezing or shortness of breath., Disp: 18 g, Rfl: 0 .  naproxen sodium (ALEVE) 220 MG tablet, Take 220 mg daily as needed by mouth (pain)., Disp: , Rfl:  .   oxyCODONE-acetaminophen (PERCOCET/ROXICET) 5-325 MG tablet, Take 1 tablet by mouth every 6 (six) hours as needed for severe pain., Disp: 8 tablet, Rfl: 0 Allergies  Allergen Reactions  . Morphine And Related Nausea And Vomiting and Other (See Comments)    Severe nausea     Social History   Socioeconomic History  . Marital status: Single    Spouse name: Not on file  . Number of children: Not on file  . Years of education: Not on file  . Highest education level: Not on file  Social Needs  . Financial resource strain: Not on file  . Food insecurity - worry: Not on file  . Food insecurity - inability: Not on file  . Transportation needs - medical: Not on file  . Transportation needs - non-medical: Not on file  Occupational History  . Not on file  Tobacco Use  . Smoking status: Former Smoker    Packs/day: 1.00    Years: 40.00    Pack years: 40.00    Types: Cigarettes    Start date: 06/23/1972    Last attempt to quit: 03/26/2012    Years since quitting: 4.8  . Smokeless tobacco: Never Used  Substance and Sexual Activity  . Alcohol use: No  . Drug use: No  . Sexual activity: Yes  Other Topics Concern  . Not on file  Social History Narrative  . Not on file    Physical Exam  Pulmonary/Chest: No respiratory distress. She has no wheezes. She has no rales.  Abdominal: She exhibits no distension. There is no tenderness. There is no guarding.  Musculoskeletal: She exhibits no edema.  Skin: Skin is warm and dry. She is not diaphoretic.        Future Appointments  Date Time Provider [place]  02/17/2017 11:00 AM [facility] PA/NP [facility] None    ATF pt CAO x4 sitting on the edge of her bed talking with her daughter and watching tv.  Pt had both pill boxes on her bed with pills in the bedtime row of the second box.  Pt also missed a couple of afternoon meds in the same box.  Pt stated that she filled the bottom of the second box last night and that's why its still full.   Pt denies sob, chest pain and dizziness.  She stated that she ate two pieces of toast and a link sausage this am for breakfast; two slices on pizza for lunch.  We discussed her diet again and the importance of diet/medications.  She became aggitated but stated that she understands and she will try to do better.  Her family seemed very supportive of helping pt reach these goals.  rx bottles verified and pill box refilled.  I asked pt to hold off on "refilling her own pill boxes until im able to determine if she's actually taking her medications  during the times, she saying that shes refilling them". Pt stated that she took her medications this am and she took the afternoon dose during our visit.  She stated that she doesn't like taking insulin because it "blows her up". I told her to let Dr. Alyson Ingles know before she stops taking any medications.   BP 140/80   Pulse 98   Resp 16   Wt 230 lb (104.3 kg)   SpO2 98%   BMI 36.02 kg/m  CBG 379  Weight yesterday-230lb Last visit weight-219  im still unsure if these weights are correct due to pt weighing in different areas of the house.  Her house has carpet in all of the rooms besides the kitchen and bathrooms.  The floors in the kitchen and bathrooms are very uneven.   Yee Joss, EMT Paramedic 02/06/2017    ACTION: Home visit completed Next visit planned for in two weeks

## 2017-02-11 ENCOUNTER — Other Ambulatory Visit: Payer: Self-pay

## 2017-02-11 ENCOUNTER — Encounter (HOSPITAL_COMMUNITY): Payer: Self-pay | Admitting: Emergency Medicine

## 2017-02-11 ENCOUNTER — Observation Stay (HOSPITAL_COMMUNITY)
Admission: EM | Admit: 2017-02-11 | Discharge: 2017-02-13 | Disposition: A | Discharge status: Home / Self Care | Payer: Medicare Other | Attending: Internal Medicine | Admitting: Internal Medicine

## 2017-02-11 ENCOUNTER — Emergency Department (HOSPITAL_COMMUNITY): Payer: Medicare Other

## 2017-02-11 DIAGNOSIS — Z96651 Presence of right artificial knee joint: Secondary | ICD-10-CM | POA: Insufficient documentation

## 2017-02-11 DIAGNOSIS — Z794 Long term (current) use of insulin: Secondary | ICD-10-CM | POA: Diagnosis not present

## 2017-02-11 DIAGNOSIS — I5021 Acute systolic (congestive) heart failure: Secondary | ICD-10-CM

## 2017-02-11 DIAGNOSIS — I5042 Chronic combined systolic (congestive) and diastolic (congestive) heart failure: Secondary | ICD-10-CM | POA: Diagnosis not present

## 2017-02-11 DIAGNOSIS — R05 Cough: Secondary | ICD-10-CM | POA: Insufficient documentation

## 2017-02-11 DIAGNOSIS — Z96641 Presence of right artificial hip joint: Secondary | ICD-10-CM | POA: Insufficient documentation

## 2017-02-11 DIAGNOSIS — Z87891 Personal history of nicotine dependence: Secondary | ICD-10-CM | POA: Insufficient documentation

## 2017-02-11 DIAGNOSIS — E1165 Type 2 diabetes mellitus with hyperglycemia: Secondary | ICD-10-CM | POA: Insufficient documentation

## 2017-02-11 DIAGNOSIS — I4891 Unspecified atrial fibrillation: Secondary | ICD-10-CM | POA: Diagnosis present

## 2017-02-11 DIAGNOSIS — R0602 Shortness of breath: Secondary | ICD-10-CM | POA: Insufficient documentation

## 2017-02-11 DIAGNOSIS — I5023 Acute on chronic systolic (congestive) heart failure: Secondary | ICD-10-CM

## 2017-02-11 DIAGNOSIS — R072 Precordial pain: Principal | ICD-10-CM | POA: Insufficient documentation

## 2017-02-11 DIAGNOSIS — Z96642 Presence of left artificial hip joint: Secondary | ICD-10-CM | POA: Diagnosis not present

## 2017-02-11 DIAGNOSIS — R079 Chest pain, unspecified: Secondary | ICD-10-CM | POA: Diagnosis present

## 2017-02-11 DIAGNOSIS — I110 Hypertensive heart disease with heart failure: Secondary | ICD-10-CM | POA: Diagnosis not present

## 2017-02-11 DIAGNOSIS — Z79899 Other long term (current) drug therapy: Secondary | ICD-10-CM | POA: Insufficient documentation

## 2017-02-11 DIAGNOSIS — Z7901 Long term (current) use of anticoagulants: Secondary | ICD-10-CM | POA: Insufficient documentation

## 2017-02-11 DIAGNOSIS — Z95810 Presence of automatic (implantable) cardiac defibrillator: Secondary | ICD-10-CM | POA: Diagnosis not present

## 2017-02-11 DIAGNOSIS — I2510 Atherosclerotic heart disease of native coronary artery without angina pectoris: Secondary | ICD-10-CM | POA: Diagnosis not present

## 2017-02-11 LAB — BASIC METABOLIC PANEL
Anion gap: 14 (ref 5–15)
BUN: 19 mg/dL (ref 6–20)
CO2: 25 mmol/L (ref 22–32)
Calcium: 9.6 mg/dL (ref 8.9–10.3)
Chloride: 98 mmol/L — ABNORMAL LOW (ref 101–111)
Creatinine, Ser: 1.17 mg/dL — ABNORMAL HIGH (ref 0.44–1.00)
GFR calc Af Amer: 56 mL/min — ABNORMAL LOW (ref >60)
GFR calc non Af Amer: 48 mL/min — ABNORMAL LOW (ref >60)
Glucose, Bld: 405 mg/dL — ABNORMAL HIGH (ref 65–99)
Potassium: 3.8 mmol/L (ref 3.5–5.1)
Sodium: 137 mmol/L (ref 135–145)

## 2017-02-11 LAB — CBC
HCT: 36.3 % (ref 36.0–46.0)
Hemoglobin: 12.7 g/dL (ref 12.0–15.0)
MCH: 30.9 pg (ref 26.0–34.0)
MCHC: 35 g/dL (ref 30.0–36.0)
MCV: 88.3 fL (ref 78.0–100.0)
Platelets: 426 10*3/uL — ABNORMAL HIGH (ref 150–400)
RBC: 4.11 MIL/uL (ref 3.87–5.11)
RDW: 12.4 % (ref 11.5–15.5)
WBC: 9.1 10*3/uL (ref 4.0–10.5)

## 2017-02-11 LAB — I-STAT TROPONIN, ED
Troponin i, poc: 0.01 ng/mL (ref 0.00–0.08)
Troponin i, poc: 0.02 ng/mL (ref 0.00–0.08)

## 2017-02-11 LAB — CBG MONITORING, ED: Glucose-Capillary: 368 mg/dL — ABNORMAL HIGH (ref 65–99)

## 2017-02-11 LAB — BRAIN NATRIURETIC PEPTIDE: B Natriuretic Peptide: 195.6 pg/mL — ABNORMAL HIGH (ref 0.0–100.0)

## 2017-02-11 MED ORDER — METOPROLOL TARTRATE 5 MG/5ML IV SOLN
2.5000 mg | Freq: Once | INTRAVENOUS | Status: AC
  Administered 2017-02-11: 2.5 mg via INTRAVENOUS
  Filled 2017-02-11: qty 5

## 2017-02-11 MED ORDER — LORAZEPAM 2 MG/ML IJ SOLN
1.0000 mg | Freq: Once | INTRAMUSCULAR | Status: AC
  Administered 2017-02-11: 1 mg via INTRAVENOUS
  Filled 2017-02-11: qty 1

## 2017-02-11 MED ORDER — INSULIN ASPART 100 UNIT/ML ~~LOC~~ SOLN
10.0000 [IU] | Freq: Once | SUBCUTANEOUS | Status: AC
  Administered 2017-02-11: 10 [IU] via SUBCUTANEOUS
  Filled 2017-02-11: qty 1

## 2017-02-11 NOTE — ED Provider Notes (Addendum)
[facility] EMERGENCY DEPARTMENT Provider Note   CSN: [id number] Arrival date & time: 02/11/17  1332     History   Chief Complaint Chief Complaint  Patient presents with  . Chest Pain  . Shortness of Breath    HPI [name] is a 67 y.o. female.  67 year old female with history of hypertension, paroxysmal A. fib, left bundle branch block, AICD, CHF who presents with sudden onset of chest heaviness and pressure that began when she was pushing an object.  Pain is sharp and worse with any movement of her arms and better with remaining still.  Patient is chronically on Eliquis.  Symptoms have not been associated diaphoresis, nausea, vomiting.  Slight non-productive cough without fever or URI symptoms.  No increased leg pain or swelling.  Pain is at the center of her chest and nonradiating.  No associated syncope or near syncope.  At triage blood sugar was 501 and patient states that she has not taken her insulin today      Past Medical History:  Diagnosis Date  . Anemia    a. Noted on 07/2012 labs, instructed to f/u PCP.  Marland Kitchen Arthritis    "joints" (11/18/2012)  . Automatic implantable cardioverter-defibrillator in situ   . CAD (coronary artery disease), native coronary artery    a. Nonobstructive by cath 02/2012 (done because of low EF).  . Chronic bronchitis ([place])    "~ every other year" (11/18/2012)  . Chronic combined systolic and diastolic CHF (congestive heart failure) ([place])    a. 03/05/12 echo:  LVEF 20-25%, moderate LVH , inferior and basal to mid septal akinesis, anterior moderate to severe hypokinesis and grade 2 diastolic dysfunction. b. EF 07/2012: EF still 25% (unclear medication compliance).  . Chronic lower back pain   . Headache(784.0)    "often; maybe not daily" (11/18/2012)  . High cholesterol   . History of noncompliance with medical treatment   . Hypertension   . LBBB (left bundle branch block)   . Orthopnea   . Tobacco abuse   . Type II  diabetes mellitus [facility])     Patient Active Problem List   Diagnosis Date Noted  . Paroxysmal A-fib ([place])   . Biventricular automatic implantable cardioverter defibrillator in situ   . Sepsis ([place]) 04/20/2016  . UTI (urinary tract infection) 04/20/2016  . Dyspnea 07/19/2015  . Interstitial lung disease ([place]) 11/20/2014  . SIRS (systemic inflammatory response syndrome) ([place]) 02/03/2014  . IDDM (insulin dependent diabetes mellitus) ([place]) 02/03/2014  . Tachycardia 06/14/2013  . Chronic systolic CHF (congestive heart failure) ([place]) 09/29/2012  . Hypoxia 03/06/2012  . Hypertensive heart disease 03/06/2012  . Nonischemic cardiomyopathy ([place]) 03/06/2012  . Tobacco abuse 03/06/2012  . Type 2 diabetes mellitus ([place]) 03/06/2012  . Hypertension 03/06/2012  . CAD (coronary artery disease), native coronary artery 03/06/2012  . Acute on chronic combined systolic and diastolic heart failure ([place]) 03/06/2012  . Hypokalemia 03/06/2012  . Acute bronchitis 02/01/2009  . Sleep apnea 02/01/2009  . Chest pain 02/01/2009    Past Surgical History:  Procedure Laterality Date  . BI-VENTRICULAR IMPLANTABLE CARDIOVERTER DEFIBRILLATOR N/A 11/18/2012   Procedure: BI-VENTRICULAR IMPLANTABLE CARDIOVERTER DEFIBRILLATOR  (CRT-D);  Surgeon: Evans Lance, MD;  Location: [facility] CATH LAB;  Service: Cardiovascular;  Laterality: N/A;  . BI-VENTRICULAR IMPLANTABLE CARDIOVERTER DEFIBRILLATOR  (CRT-D)  11/18/2012  . CARDIAC CATHETERIZATION  03/04/12   nonobstructive CAD, elevated LVEDP and tortuous vessels suggestive of long-standing hypertension  . COLONOSCOPY WITH PROPOFOL N/A 12/12/2016  Procedure: COLONOSCOPY WITH PROPOFOL;  Surgeon: Carol Ada, MD;  Location: [facility] ENDOSCOPY;  Service: Endoscopy;  Laterality: N/A;  . JOINT REPLACEMENT     Bilateral hip and right knee  . LEFT HEART CATH N/A 03/05/2012   Procedure: LEFT HEART CATH;  Surgeon: Larey Dresser, MD;  Location: [facility] CATH LAB;  Service: Cardiovascular;   Laterality: N/A;    OB History    No data available       Home Medications    Prior to Admission medications   Medication Sig Start Date End Date Taking? Authorizing Provider  acetaminophen (TYLENOL) 500 MG tablet Take 1 tablet (500 mg total) by mouth every 6 (six) hours as needed. 09/15/15   Leo Grosser, MD  albuterol (PROVENTIL HFA;VENTOLIN HFA) 108 (90 Base) MCG/ACT inhaler Inhale 2 puffs into the lungs every 6 (six) hours as needed for wheezing or shortness of breath. 04/25/16   Ghimire, Henreitta Leber, MD  apixaban (ELIQUIS) 5 MG TABS tablet Take 1 tablet (5 mg total) by mouth 2 (two) times daily. 05/22/16   Clegg, Amy D, NP  atorvastatin (LIPITOR) 40 MG tablet Take 1 tablet (40 mg total) by mouth daily. 05/22/16   Clegg, Amy D, NP  carvedilol (COREG) 12.5 MG tablet Take 1.5 tablets (18.75 mg total) by mouth 2 (two) times daily with a meal. 12/16/16   Clegg, Amy D, NP  ELIQUIS 5 MG TABS tablet TAKE 1 TABLET(5 MG) BY MOUTH TWICE DAILY 10/03/16   Larey Dresser, MD  furosemide (LASIX) 40 MG tablet Take 1 tablet (40 mg total) by mouth 2 (two) times daily. 01/09/17   Clegg, Amy D, NP  glucose blood (FREESTYLE TEST STRIPS) test strip Use as instructed 04/25/16   Ghimire, Henreitta Leber, MD  glucose monitoring kit (FREESTYLE) monitoring kit 1 each by Does not apply route 4 (four) times daily - after meals and at bedtime. 1 month Diabetic Testing Supplies for QAC-QHS accuchecks. 04/25/16   Ghimire, Henreitta Leber, MD  insulin aspart (NOVOLOG FLEXPEN) 100 UNIT/ML FlexPen 0-20 Units, Subcutaneous, 3 times daily with meals CBG < 70: implement hypoglycemia protocol CBG 70 - 120: 0 units CBG 121 - 150: 3 units CBG 151 - 200: 4 units CBG 201 - 250: 7 units CBG 251 - 300: 11 units CBG 301 - 350: 15 units CBG 351 - 400: 20 units CBG > 400: call MD 04/25/16   Jonetta Osgood, MD  Insulin Glargine (LANTUS SOLOSTAR) 100 UNIT/ML Solostar Pen Inject 50 Units into the skin daily at 10 pm.    [provider]    Insulin Pen Needle 32G X 8 MM MISC Use as directed 06/18/16   Arbutus Leas, NP  isosorbide-hydrALAZINE (BIDIL) 20-37.5 MG tablet Take 2 tablets by mouth 3 (three) times daily. 09/11/16   Arbutus Leas, NP  Lancets (FREESTYLE) lancets Use as instructed 06/18/16   Arbutus Leas, NP  metFORMIN (GLUCOPHAGE) 1000 MG tablet TAKE 1 TABLET(1000 MG) BY MOUTH TWICE DAILY WITH A MEAL 05/30/16   Amyra Friar, PA-C  naproxen sodium (ALEVE) 220 MG tablet Take 220 mg daily as needed by mouth (pain).    [provider]  oxyCODONE-acetaminophen (PERCOCET/ROXICET) 5-325 MG tablet Take 1 tablet by mouth every 6 (six) hours as needed for severe pain. 05/07/16   Charlann Lange, PA-C  potassium chloride 20 MEQ TBCR Take 40 mEq by mouth daily. 10/07/16   Arbutus Leas, NP  sacubitril-valsartan (ENTRESTO) 97-103 MG Take 1 tablet by mouth 2 (  two) times daily. 06/04/16   Clegg, Amy D, NP  spironolactone (ALDACTONE) 25 MG tablet Take 1 tablet (25 mg total) by mouth daily. 05/22/16   Clegg, Amy D, NP  spironolactone (ALDACTONE) 25 MG tablet TAKE 1 TABLET(25 MG) BY MOUTH DAILY 09/30/16   Bensimhon, Shaune Pascal, MD    Family History Family History  Problem Relation Age of Onset  . Heart disease Neg Hx     Social History Social History   Tobacco Use  . Smoking status: Former Smoker    Packs/day: 1.00    Years: 40.00    Pack years: 40.00    Types: Cigarettes    Start date: 06/23/1972    Last attempt to quit: 03/26/2012    Years since quitting: 4.8  . Smokeless tobacco: Never Used  Substance Use Topics  . Alcohol use: No  . Drug use: No     Allergies   Morphine and related   Review of Systems Review of Systems  All other systems reviewed and are negative.    Physical Exam Updated Vital Signs BP (!) 105/56 (BP Location: Left Arm)   Pulse (!) 106   Temp 98.5 F (36.9 C) (Oral)   Resp 16   SpO2 98%   Physical Exam  Constitutional: She is oriented to person, place, and time. She appears  well-developed and well-nourished.  Non-toxic appearance. No distress.  HENT:  Head: Normocephalic and atraumatic.  Eyes: Conjunctivae, EOM and lids are normal. Pupils are equal, round, and reactive to light.  Neck: Normal range of motion. Neck supple. No tracheal deviation present. No thyroid mass present.  Cardiovascular: Normal heart sounds. An irregularly irregular rhythm present. Tachycardia present. Exam reveals no gallop.  No murmur heard. Pulmonary/Chest: Effort normal and breath sounds normal. No stridor. No respiratory distress. She has no decreased breath sounds. She has no wheezes. She has no rhonchi. She has no rales.    Abdominal: Soft. Normal appearance and bowel sounds are normal. She exhibits no distension. There is no tenderness. There is no rebound and no CVA tenderness.  Musculoskeletal: Normal range of motion. She exhibits no edema or tenderness.  Neurological: She is alert and oriented to person, place, and time. She has normal strength. No cranial nerve deficit or sensory deficit. GCS eye subscore is 4. GCS verbal subscore is 5. GCS motor subscore is 6.  Skin: Skin is warm and dry. No abrasion and no rash noted.  Psychiatric: She has a normal mood and affect. Her speech is normal and behavior is normal.  Nursing note and vitals reviewed.    ED Treatments / Results  Labs (all labs ordered are listed, but only abnormal results are displayed) Labs Reviewed  BASIC METABOLIC PANEL  CBC  BRAIN NATRIURETIC PEPTIDE  I-STAT TROPONIN, ED    EKG  EKG Interpretation  Date/Time:  Wednesday February 11 2017 14:11:08 EST Ventricular Rate:  117 PR Interval:    QRS Duration: 139 QT Interval:  415 QTC Calculation: 554 R Axis:   -109 Text Interpretation:  Atrial fibrillation Right bundle branch block Extensive anterior infarct, age indeterminate Confirmed by Lacretia Leigh [phone]) on 02/11/2017 2:17:10 PM       Radiology No results found.  Procedures Procedures  (including critical care time)  Medications Ordered in ED Medications  metoprolol tartrate (LOPRESSOR) injection 2.5 mg (not administered)     Initial Impression / Assessment and Plan / ED Course  I have reviewed the triage vital signs and the nursing notes.  Pertinent  labs & imaging results that were available during my care of the patient were reviewed by me and considered in my medical decision making (see chart for details).   Patient has been experiencing runs of atrial fibrillation with rapid ventricular rate response which then seems to be controlled by her pacemaker.  Patient is chest pain appears to be chest wall in nature.  Patient treated with Ativan for this.  Spoke with Dr. Ellyn Hack from cardiology who will contact his EP colleagues and they will come to the patient.  Patient also have delta troponin.  Care will be signed out to Dr. Laverta Baltimore  Final Clinical Impressions(s) / ED Diagnoses   Final diagnoses:  None    ED Discharge Orders    None       Lacretia Leigh, MD 02/11/17 1544    Lacretia Leigh, MD 02/11/17 1546

## 2017-02-11 NOTE — ED Provider Notes (Signed)
Patient placed in Quick Look pathway, seen and evaluated   Chief Complaint: cp, cough  HPI:   Acute onset of cp earlier today, cough  ROS: no fever, no worsening shortness of breath   Physical Exam:   Gen: No distress  Neuro: Awake and Alert  Skin: Warm    Focused Exam: Head is normocephalic and atraumatic.  Neck is supple, no JVD. Lungs are clear without rales, wheezes, rhonchi. Heart with tachycardia, normal rhythm without murmur. Abdomen is soft, nontender tender.  Extremities have no cyanosis or edema, full range of motion is present. Skin is warm and moist without rash. Neurologic: Mental status is normal. Psychiatric: appears anxious    Initiation of care has begun. The patient has been counseled on the process, plan, and necessity for staying for the completion/evaluation, and the remainder of the medical screening examination    Domenic Moras, Hershal Coria 02/11/17 1346    Lacretia Leigh, MD 02/12/17 [phone]

## 2017-02-11 NOTE — ED Provider Notes (Signed)
Blood pressure (!) 141/79, pulse (!) 107, temperature 98.5 F (36.9 C), temperature source Oral, resp. rate (!) 24, SpO2 97 %.  Assuming care from Dr. Zenia Resides.  In short, [name] is a 67 y.o. female with a chief complaint of Chest Pain and Shortness of Breath .  Refer to the original H&P for additional details.  The current plan of care is to follow up after Cardiology consultation and repeat troponin.   Discussed patient's case with Cardiology, Dr. Debara Pickett to request admission. Patient and family (if present) updated with plan. Care transferred to Cardiology service.  I reviewed all nursing notes, vitals, pertinent old records, EKGs, labs, imaging (as available).   EKG Interpretation  Date/Time:  Wednesday February 11 2017 14:11:08 EST Ventricular Rate:  117 PR Interval:    QRS Duration: 139 QT Interval:  415 QTC Calculation: 554 R Axis:   -109 Text Interpretation:  Atrial fibrillation Right bundle branch block Extensive anterior infarct, age indeterminate Confirmed by Lacretia Leigh ([id number]) on 02/11/2017 2:17:10 PM      Nanda Quinton, MD    Long, Wonda Olds, MD 02/11/17 2005

## 2017-02-11 NOTE — ED Triage Notes (Signed)
Pt arrives from ems and home with c/o cp x about 45 mins to an  1 hour  Ago states  Pain is center odf chest has had more coughing with white green sputum cbg is 501 has taken her metformin but has no other insulin today

## 2017-02-11 NOTE — ED Notes (Signed)
Pt provided with food from family.  Tolerated well

## 2017-02-11 NOTE — Progress Notes (Signed)
Discussed case with Dr. Laverta Baltimore.  [name] presented earlier and we were contacted for evaluation of worsening shortness of breath.  She was in the emergency department with A. fib and RVR.  [name] earlier performed a consultation and suggested that she could be a candidate for cardioversion tomorrow if she has been compliant on her Eliquis.  In addition he felt there was some mild acute on chronic systolic heart failure and will need diuresis.  Based on these findings, we will plan to admit to cardiology service and have the advanced heart failure service assumed care in the morning.  Please keep her n.p.o. after midnight.  Dr. Tanna Furry note will serve as an admission history and physical.  Pixie Casino, MD, FACC, [place] Director of the Advanced Lipid Disorders &  Cardiovascular Risk Reduction Clinic Diplomate of the American Board of Clinical Lipidology Attending Cardiologist  Direct Dial: [phone]  Fax: [phone]  Website:  [email or web address]

## 2017-02-11 NOTE — Consult Note (Signed)
Reason for Consult:atrial fib with competitive ventricular pacing  Referring Physician: Dr. Nance Pew is an 67 y.o. female.   HPI: The patient is well known to me. She has a h/o a non-ischemic CM and chronic systolic heart failure and LBBB and underweent BiV ICD insertion in 2014. She has a h/o PAF and was in her usual state of health until yesterday when she developed worsening sob and presented to the ED and was in atrial fib with a RVR. She has competitive pacing turned on in her ICD which attempt to pace the heart in atrial fib. She has been treated with lasix and feels better. She is probably still volume overloaded. She does not feel palpitations but does note sob. I could not find her ICD interrogation in the ED.   PMH: Past Medical History:  Diagnosis Date  . Anemia    a. Noted on 07/2012 labs, instructed to f/u PCP.  Marland Kitchen Arthritis    "joints" (11/18/2012)  . Automatic implantable cardioverter-defibrillator in situ   . CAD (coronary artery disease), native coronary artery    a. Nonobstructive by cath 02/2012 (done because of low EF).  . Chronic bronchitis ([place])    "~ every other year" (11/18/2012)  . Chronic combined systolic and diastolic CHF (congestive heart failure) ([place])    a. 03/05/12 echo:  LVEF 20-25%, moderate LVH , inferior and basal to mid septal akinesis, anterior moderate to severe hypokinesis and grade 2 diastolic dysfunction. b. EF 07/2012: EF still 25% (unclear medication compliance).  . Chronic lower back pain   . Headache(784.0)    "often; maybe not daily" (11/18/2012)  . High cholesterol   . History of noncompliance with medical treatment   . Hypertension   . LBBB (left bundle branch block)   . Orthopnea   . Tobacco abuse   . Type II diabetes mellitus ([place])     PSHX: Past Surgical History:  Procedure Laterality Date  . BI-VENTRICULAR IMPLANTABLE CARDIOVERTER DEFIBRILLATOR N/A 11/18/2012   Procedure: BI-VENTRICULAR IMPLANTABLE CARDIOVERTER  DEFIBRILLATOR  (CRT-D);  Surgeon: Evans Lance, MD;  Location: [facility] CATH LAB;  Service: Cardiovascular;  Laterality: N/A;  . BI-VENTRICULAR IMPLANTABLE CARDIOVERTER DEFIBRILLATOR  (CRT-D)  11/18/2012  . CARDIAC CATHETERIZATION  03/04/12   nonobstructive CAD, elevated LVEDP and tortuous vessels suggestive of long-standing hypertension  . COLONOSCOPY WITH PROPOFOL N/A 12/12/2016   Procedure: COLONOSCOPY WITH PROPOFOL;  Surgeon: Carol Ada, MD;  Location: [facility] ENDOSCOPY;  Service: Endoscopy;  Laterality: N/A;  . JOINT REPLACEMENT     Bilateral hip and right knee  . LEFT HEART CATH N/A 03/05/2012   Procedure: LEFT HEART CATH;  Surgeon: Larey Dresser, MD;  Location[facility] CATH LAB;  Service: Cardiovascular;  Laterality: N/A;    FAMHX: Family History  Problem Relation Age of Onset  . Heart disease Neg Hx     Social History:  reports that she quit smoking about 4 years ago. Her smoking use included cigarettes. She started smoking about 44 years ago. She has a 40.00 pack-year smoking history. she has never used smokeless tobacco. She reports that she does not drink alcohol or use drugs.  Allergies:  Allergies  Allergen Reactions  . Morphine And Related Nausea And Vomiting and Other (See Comments)    Severe nausea    Medications: I have reviewed the patient's current medications.  Dg Chest Port 1 View  Result Date: 02/11/2017 CLINICAL DATA:  Chest pain and cough. EXAM: PORTABLE CHEST 1 VIEW COMPARISON:  Chest  x-ray dated November 30, 2016. FINDINGS: The tips of the bilateral costophrenic angles are not included in the field of view. Left chest wall pacer device in unchanged position. Stable borderline cardiomegaly. Normal pulmonary vascularity. No focal consolidation, pleural effusion, or pneumothorax. No acute osseous abnormality. IMPRESSION: No active disease. Electronically Signed   By: Titus Dubin M.D.   On: 02/11/2017 14:49    ROS  As stated in the HPI and negative for all other  systems.  Physical Exam  Vitals:Blood pressure (!) 110/94, pulse (!) 106, temperature 98.5 F (36.9 C), temperature source Oral, resp. rate 17, SpO2 99 %.  Well appearing 67 yo woman, NAD HEENT: Unremarkable Neck:  6 cm JVD, no thyromegally Lymphatics:  No adenopathy Back:  No CVA tenderness Lungs:  Clear with no wheezes HEART:  IRegular tachy rhythm, no murmurs, no rubs, no clicks Abd:  soft, obese, positive bowel sounds, no organomegally, no rebound, no guarding Ext:  2 plus pulses, no edema, no cyanosis, no clubbing Skin:  No rashes no nodules Neuro:  CN II through XII intact, motor grossly intact  ECG - atrial fib with a RVR and intermittent ventricular pacing  CXR - reviewed  Labs - reviewed  Assessment/Plan: 1. Recurrent atrial fib with a RVR - I did not ask her but if she has not missed any of her Eliquis, she could undergo DCCV tomorrow. We will make her NPO and confirm that she has not missed any Eliquis.  2. Acute on chronic systolic heart failure - this has been exacerbated by the atrial fib. She will need diuresis with IV lasix. 3. ICD - we will review her device interogation in the morning. 4. Obesity - she remains overweight and will need to work on this.  Chi Woodham,M.D.  Carleene Overlie TaylorMD 02/11/2017, 6:34 PM

## 2017-02-11 NOTE — ED Notes (Signed)
Pt ambulatory to restroom.  Tolerated it well

## 2017-02-12 ENCOUNTER — Encounter (HOSPITAL_COMMUNITY): Payer: Self-pay | Admitting: Anesthesiology

## 2017-02-12 ENCOUNTER — Encounter (HOSPITAL_COMMUNITY)
Admission: EM | Disposition: A | Discharge status: Home / Self Care | Payer: Self-pay | Source: Home / Self Care | Attending: Emergency Medicine

## 2017-02-12 ENCOUNTER — Encounter (HOSPITAL_COMMUNITY): Payer: Self-pay | Admitting: Nurse Practitioner

## 2017-02-12 DIAGNOSIS — I5022 Chronic systolic (congestive) heart failure: Secondary | ICD-10-CM

## 2017-02-12 DIAGNOSIS — R112 Nausea with vomiting, unspecified: Secondary | ICD-10-CM

## 2017-02-12 DIAGNOSIS — I480 Paroxysmal atrial fibrillation: Secondary | ICD-10-CM

## 2017-02-12 DIAGNOSIS — R072 Precordial pain: Secondary | ICD-10-CM | POA: Diagnosis not present

## 2017-02-12 LAB — GLUCOSE, CAPILLARY
Glucose-Capillary: 241 mg/dL — ABNORMAL HIGH (ref 65–99)
Glucose-Capillary: 210 mg/dL — ABNORMAL HIGH (ref 65–99)
Glucose-Capillary: 275 mg/dL — ABNORMAL HIGH (ref 65–99)
Glucose-Capillary: 237 mg/dL — ABNORMAL HIGH (ref 65–99)
Glucose-Capillary: 348 mg/dL — ABNORMAL HIGH (ref 65–99)

## 2017-02-12 LAB — BASIC METABOLIC PANEL
Anion gap: 10 (ref 5–15)
BUN: 20 mg/dL (ref 6–20)
CO2: 25 mmol/L (ref 22–32)
Calcium: 9.2 mg/dL (ref 8.9–10.3)
Chloride: 104 mmol/L (ref 101–111)
Creatinine, Ser: 1.02 mg/dL — ABNORMAL HIGH (ref 0.44–1.00)
GFR calc Af Amer: >60 mL/min (ref >60)
GFR calc non Af Amer: 57 mL/min — ABNORMAL LOW (ref >60)
Glucose, Bld: 251 mg/dL — ABNORMAL HIGH (ref 65–99)
Potassium: 3.8 mmol/L (ref 3.5–5.1)
Sodium: 139 mmol/L (ref 135–145)

## 2017-02-12 SURGERY — CARDIOVERSION
Anesthesia: General

## 2017-02-12 MED ORDER — ATORVASTATIN CALCIUM 40 MG PO TABS
40.0000 mg | ORAL_TABLET | Freq: Every day | ORAL | Status: DC
  Administered 2017-02-12 – 2017-02-13 (×2): 40 mg via ORAL
  Filled 2017-02-12 (×2): qty 1

## 2017-02-12 MED ORDER — CARVEDILOL 25 MG PO TABS
25.0000 mg | ORAL_TABLET | Freq: Two times a day (BID) | ORAL | Status: DC
  Administered 2017-02-12 – 2017-02-13 (×3): 25 mg via ORAL
  Filled 2017-02-12 (×3): qty 1

## 2017-02-12 MED ORDER — SODIUM CHLORIDE 0.9 % IV SOLN
250.0000 mL | INTRAVENOUS | Status: DC | PRN
Start: 2017-02-12 — End: 2017-02-13

## 2017-02-12 MED ORDER — SODIUM CHLORIDE 0.9% FLUSH: 3.0000 mL | INTRAVENOUS | Status: DC | PRN

## 2017-02-12 MED ORDER — APIXABAN 5 MG PO TABS
5.0000 mg | ORAL_TABLET | Freq: Two times a day (BID) | ORAL | Status: DC
  Administered 2017-02-12 – 2017-02-13 (×4): 5 mg via ORAL
  Filled 2017-02-12 (×4): qty 1

## 2017-02-12 MED ORDER — CARVEDILOL 12.5 MG PO TABS: 25.0000 mg | ORAL_TABLET | Freq: Two times a day (BID) | ORAL | 3 refills | Status: DC

## 2017-02-12 MED ORDER — INSULIN GLARGINE 100 UNIT/ML ~~LOC~~ SOLN
50.0000 [IU] | Freq: Every day | SUBCUTANEOUS | Status: DC
  Administered 2017-02-12 (×2): 50 [IU] via SUBCUTANEOUS
  Filled 2017-02-12 (×2): qty 0.5

## 2017-02-12 MED ORDER — ONDANSETRON HCL 4 MG/2ML IJ SOLN
4.0000 mg | Freq: Four times a day (QID) | INTRAMUSCULAR | Status: DC | PRN
  Administered 2017-02-12 (×2): 4 mg via INTRAVENOUS
  Filled 2017-02-12 (×2): qty 2

## 2017-02-12 MED ORDER — ACETAMINOPHEN 325 MG PO TABS: 650.0000 mg | ORAL_TABLET | ORAL | Status: DC | PRN

## 2017-02-12 MED ORDER — POTASSIUM CHLORIDE CRYS ER 20 MEQ PO TBCR
40.0000 meq | EXTENDED_RELEASE_TABLET | Freq: Every day | ORAL | Status: DC
  Administered 2017-02-13: 40 meq via ORAL
  Filled 2017-02-12 (×2): qty 2

## 2017-02-12 MED ORDER — FUROSEMIDE 10 MG/ML IJ SOLN
20.0000 mg | Freq: Once | INTRAMUSCULAR | Status: AC
  Administered 2017-02-12: 20 mg via INTRAVENOUS

## 2017-02-12 MED ORDER — SPIRONOLACTONE 25 MG PO TABS
25.0000 mg | ORAL_TABLET | Freq: Every day | ORAL | Status: DC
  Administered 2017-02-12 – 2017-02-13 (×2): 25 mg via ORAL
  Filled 2017-02-12 (×2): qty 1

## 2017-02-12 MED ORDER — OXYCODONE HCL 5 MG PO TABS
10.0000 mg | ORAL_TABLET | Freq: Three times a day (TID) | ORAL | Status: DC
  Administered 2017-02-12 – 2017-02-13 (×2): 10 mg via ORAL
  Filled 2017-02-12 (×3): qty 2

## 2017-02-12 MED ORDER — ALBUTEROL SULFATE (2.5 MG/3ML) 0.083% IN NEBU: 2.5000 mg | INHALATION_SOLUTION | Freq: Four times a day (QID) | RESPIRATORY_TRACT | Status: DC | PRN

## 2017-02-12 MED ORDER — MIRTAZAPINE 7.5 MG PO TABS
7.5000 mg | ORAL_TABLET | Freq: Once | ORAL | Status: DC
  Filled 2017-02-12: qty 1

## 2017-02-12 MED ORDER — ISOSORB DINITRATE-HYDRALAZINE 20-37.5 MG PO TABS
2.0000 | ORAL_TABLET | Freq: Three times a day (TID) | ORAL | Status: DC
  Administered 2017-02-12 – 2017-02-13 (×3): 2 via ORAL
  Filled 2017-02-12 (×3): qty 2

## 2017-02-12 MED ORDER — INSULIN ASPART 100 UNIT/ML ~~LOC~~ SOLN
0.0000 [IU] | Freq: Three times a day (TID) | SUBCUTANEOUS | Status: DC
  Administered 2017-02-12: 5 [IU] via SUBCUTANEOUS

## 2017-02-12 MED ORDER — SACUBITRIL-VALSARTAN 49-51 MG PO TABS
1.0000 | ORAL_TABLET | Freq: Two times a day (BID) | ORAL | Status: DC
  Administered 2017-02-12 – 2017-02-13 (×3): 1 via ORAL
  Filled 2017-02-12 (×3): qty 1

## 2017-02-12 MED ORDER — ALBUTEROL SULFATE HFA 108 (90 BASE) MCG/ACT IN AERS: 2.0000 | INHALATION_SPRAY | Freq: Four times a day (QID) | RESPIRATORY_TRACT | Status: DC | PRN

## 2017-02-12 MED ORDER — FUROSEMIDE 10 MG/ML IJ SOLN
INTRAMUSCULAR | Status: AC
  Administered 2017-02-12: 20 mg via INTRAVENOUS
  Filled 2017-02-12: qty 2

## 2017-02-12 MED ORDER — FUROSEMIDE 10 MG/ML IJ SOLN
40.0000 mg | Freq: Two times a day (BID) | INTRAMUSCULAR | Status: DC
  Administered 2017-02-12: 40 mg via INTRAVENOUS
  Filled 2017-02-12 (×2): qty 4

## 2017-02-12 MED ORDER — SODIUM CHLORIDE 0.9% FLUSH
3.0000 mL | Freq: Two times a day (BID) | INTRAVENOUS | Status: DC
  Administered 2017-02-12 (×3): 3 mL via INTRAVENOUS

## 2017-02-12 MED ORDER — METOPROLOL TARTRATE 5 MG/5ML IV SOLN: 2.5000 mg | INTRAVENOUS | Status: DC | PRN

## 2017-02-12 NOTE — Discharge Instructions (Addendum)
Atrial Fibrillation Atrial fibrillation is a type of irregular or rapid heartbeat (arrhythmia). In atrial fibrillation, the heart quivers continuously in a chaotic pattern. This occurs when parts of the heart receive disorganized signals that make the heart unable to pump blood normally. This can increase the risk for stroke, heart failure, and other heart-related conditions. There are different types of atrial fibrillation, including:  Paroxysmal atrial fibrillation. This type starts suddenly, and it usually stops on its own shortly after it starts.  Persistent atrial fibrillation. This type often lasts longer than a week. It may stop on its own or with treatment.  Long-lasting persistent atrial fibrillation. This type lasts longer than 12 months.  Permanent atrial fibrillation. This type does not go away.  Talk with your health care provider to learn about the type of atrial fibrillation that you have. What are the causes? This condition is caused by some heart-related conditions or procedures, including:  A heart attack.  Coronary artery disease.  Heart failure.  Heart valve conditions.  High blood pressure.  Inflammation of the sac that surrounds the heart (pericarditis).  Heart surgery.  Certain heart rhythm disorders, such as Wolf-Parkinson-White syndrome.  Other causes include:  Pneumonia.  Obstructive sleep apnea.  Blockage of an artery in the lungs (pulmonary embolism, or PE).  Lung cancer.  Chronic lung disease.  Thyroid problems, especially if the thyroid is overactive (hyperthyroidism).  Caffeine.  Excessive alcohol use or illegal drug use.  Use of some medicines, including certain decongestants and diet pills.  Sometimes, the cause cannot be found. What increases the risk? This condition is more likely to develop in:  People who are older in age.  People who smoke.  People who have diabetes mellitus.  People who are overweight  (obese).  Athletes who exercise vigorously.  What are the signs or symptoms? Symptoms of this condition include:  A feeling that your heart is beating rapidly or irregularly.  A feeling of discomfort or pain in your chest.  Shortness of breath.  Sudden light-headedness or weakness.  Getting tired easily during exercise.  In some cases, there are no symptoms. How is this diagnosed? Your health care provider may be able to detect atrial fibrillation when taking your pulse. If detected, this condition may be diagnosed with:  An electrocardiogram (ECG).  A Holter monitor test that records your heartbeat patterns over a 24-hour period.  Transthoracic echocardiogram (TTE) to evaluate how blood flows through your heart.  Transesophageal echocardiogram (TEE) to view more detailed images of your heart.  A stress test.  Imaging tests, such as a CT scan or chest X-ray.  Blood tests.  How is this treated? The main goals of treatment are to prevent blood clots from forming and to keep your heart beating at a normal rate and rhythm. The type of treatment that you receive depends on many factors, such as your underlying medical conditions and how you feel when you are experiencing atrial fibrillation. This condition may be treated with:  Medicine to slow down the heart rate, bring the hearts rhythm back to normal, or prevent clots from forming.  Electrical cardioversion. This is a procedure that resets your hearts rhythm by delivering a controlled, low-energy shock to the heart through your skin.  Different types of ablation, such as catheter ablation, catheter ablation with pacemaker, or surgical ablation. These procedures destroy the heart tissues that send abnormal signals. When the pacemaker is used, it is placed under your skin to help your heart beat in  a regular rhythm.  Follow these instructions at home:  Take over-the counter and prescription medicines only as told by your  health care provider.  If your health care provider prescribed a blood-thinning medicine (anticoagulant), take it exactly as told. Taking too much blood-thinning medicine can cause bleeding. If you do not take enough blood-thinning medicine, you will not have the protection that you need against stroke and other problems.  Do not use tobacco products, including cigarettes, chewing tobacco, and e-cigarettes. If you need help quitting, ask your health care provider.  If you have obstructive sleep apnea, manage your condition as told by your health care provider.  Do not drink alcohol.  Do not drink beverages that contain caffeine, such as coffee, soda, and tea.  Maintain a healthy weight. Do not use diet pills unless your health care provider approves. Diet pills may make heart problems worse.  Follow diet instructions as told by your health care provider.  Exercise regularly as told by your health care provider.  Keep all follow-up visits as told by your health care provider. This is important. How is this prevented?  Avoid drinking beverages that contain caffeine or alcohol.  Avoid certain medicines, especially medicines that are used for breathing problems.  Avoid certain herbs and herbal medicines, such as those that contain ephedra or ginseng.  Do not use illegal drugs, such as cocaine and amphetamines.  Do not smoke.  Manage your high blood pressure. Contact a health care provider if:  You notice a change in the rate, rhythm, or strength of your heartbeat.  You are taking an anticoagulant and you notice increased bruising.  You tire more easily when you exercise or exert yourself. Get help right away if:  You have chest pain, abdominal pain, sweating, or weakness.  You feel nauseous.  You notice blood in your vomit, bowel movement, or urine.  You have shortness of breath.  You suddenly have swollen feet and ankles.  You feel dizzy.  You have sudden weakness or  numbness of the face, arm, or leg, especially on one side of the body.  You have trouble speaking, trouble understanding, or both (aphasia).  Your face or your eyelid droops on one side. These symptoms may represent a serious problem that is an emergency. Do not wait to see if the symptoms will go away. Get medical help right away. Call your local emergency services (911 in the [place] Do not drive yourself to the hospital. This information is not intended to replace advice given to you by your health care provider. Make sure you discuss any questions you have with your health care provider. Document Released: 01/13/2005 Document Revised: 05/23/2015 Document Reviewed: 05/10/2014 Elsevier Interactive Patient Education  2018 [place] on my medicine - ELIQUIS (apixaban)  This medication education was reviewed with me or my healthcare representative as part of my discharge preparation.    Why was Eliquis prescribed for you? Eliquis was prescribed for you to reduce the risk of a blood clot forming that can cause a stroke if you have a medical condition called atrial fibrillation (a type of irregular heartbeat).  What do You need to know about Eliquis ? Take your Eliquis TWICE DAILY - one tablet in the morning and one tablet in the evening with or without food. If you have difficulty swallowing the tablet whole please discuss with your pharmacist how to take the medication safely.  Take Eliquis exactly as prescribed by your doctor and DO NOT stop taking  Eliquis without talking to the doctor who prescribed the medication.  Stopping may increase your risk of developing a stroke.  Refill your prescription before you run out.  After discharge, you should have regular check-up appointments with your healthcare provider that is prescribing your Eliquis.  In the future your dose may need to be changed if your kidney function or weight changes by a significant amount or as you get  older.  What do you do if you miss a dose? If you miss a dose, take it as soon as you remember on the same day and resume taking twice daily.  Do not take more than one dose of ELIQUIS at the same time to make up a missed dose.  Important Safety Information A possible side effect of Eliquis is bleeding. You should call your healthcare provider right away if you experience any of the following: ? Bleeding from an injury or your nose that does not stop. ? Unusual colored urine (red or dark brown) or unusual colored stools (red or black). ? Unusual bruising for unknown reasons. ? A serious fall or if you hit your head (even if there is no bleeding).  Some medicines may interact with Eliquis and might increase your risk of bleeding or clotting while on Eliquis. To help avoid this, consult your healthcare provider or pharmacist prior to using any new prescription or non-prescription medications, including herbals, vitamins, non-steroidal anti-inflammatory drugs (NSAIDs) and supplements.  This website has more information on Eliquis (apixaban): http://www.eliquis.com/eliquis/home

## 2017-02-12 NOTE — Discharge Summary (Signed)
ELECTROPHYSIOLOGY PROCEDURE DISCHARGE SUMMARY    Patient ID: [name],  MRN: [id number], DOB/AGE: 27-Jan-1951 67 y.o.  Admit date: 02/11/2017 Discharge date: 02/12/2017  Primary Care Physician: [name], MD Primary Cardiologist: [place] Electrophysiologist: Lovena Le  Primary Discharge Diagnosis:  1. Paroxysmal atrial fibrillation with RVR  Secondary discharge diagnosis: 1.  Chronic systolic heart failure 2.  CAD 3.  HTN 4.  Diabetes 5.  LBBB  Allergies  Allergen Reactions  . Morphine And Related Nausea And Vomiting and Other (See Comments)    Severe nausea    Brief HPI/Hospital Course:  [name] is a 67 y.o. female with the above past medical history. She has been doing relatively well.  On the day of admission she developed acute palpitations, shortness of breath and exercise intolerance. She presented to the ER for further evaluation and was found to be in AF with RVR.  She was diuresed and given BB. She reverted to SR overnight and is significantly symptomatically improved. Device interrogation reviewed. She has chronically elevated sinus rates. Optivol flat. Normal device function. Overall AF burden very low.  She will be given one more dose of IV Lasix and then discharged to home. She has early follow up arranged with the HF clinic and will see Dr Lovena Le in 4 weeks.    Physical Exam: Vitals:   02/12/17 0030 02/12/17 0100 02/12/17 0131 02/12/17 0608  BP: (!) 123/95 115/88 119/86 97/80  Pulse: (!) 105 93 (!) 104 99  Resp: (!) '9 10 12 16  ' Temp:   98.5 F (36.9 C) 98.3 F (36.8 C)  TempSrc:   Oral Oral  SpO2: 95% 91% 100% 96%  Weight:   230 lb 8 oz (104.6 kg)   Height:   '5\' 7"'  (1.702 m)     GEN- The patient is well appearing, alert and oriented x 3 today.   HEENT: normocephalic, atraumatic; sclera clear, conjunctiva pink; hearing intact; oropharynx clear; neck supple  Lungs- Clear to ausculation bilaterally, normal work of breathing.  No  wheezes, rales, rhonchi Heart- Regular rate and rhythm  GI- soft, non-tender, non-distended, bowel sounds present  Extremities- no clubbing, cyanosis, or edema  MS- no significant deformity or atrophy Skin- warm and dry, no rash or lesion Psych- euthymic mood, full affect Neuro- strength and sensation are intact    Labs:   Lab Results  Component Value Date   WBC 9.1 02/11/2017   HGB 12.7 02/11/2017   HCT 36.3 02/11/2017   MCV 88.3 02/11/2017   PLT 426 (H) 02/11/2017    Recent Labs  Lab 02/12/17 0730  NA 139  K 3.8  CL 104  CO2 25  BUN 20  CREATININE 1.02*  CALCIUM 9.2  GLUCOSE 251*     Discharge Medications:  Allergies as of 02/12/2017      Reactions   Morphine And Related Nausea And Vomiting, Other (See Comments)   Severe nausea      Medication List    TAKE these medications   acetaminophen 500 MG tablet Commonly known as:  TYLENOL Take 1 tablet (500 mg total) by mouth every 6 (six) hours as needed.   albuterol 108 (90 Base) MCG/ACT inhaler Commonly known as:  PROVENTIL HFA;VENTOLIN HFA Inhale 2 puffs into the lungs every 6 (six) hours as needed for wheezing or shortness of breath.   apixaban 5 MG Tabs tablet Commonly known as:  ELIQUIS Take 1 tablet (5 mg total) by mouth 2 (two) times daily. What changed:  Another medication with the same name was removed. Continue taking this medication, and follow the directions you see here.   atorvastatin 40 MG tablet Commonly known as:  LIPITOR Take 1 tablet (40 mg total) by mouth daily.   carvedilol 12.5 MG tablet Commonly known as:  COREG Take 2 tablets (25 mg total) by mouth 2 (two) times daily with a meal. What changed:  how much to take   freestyle lancets Use as instructed   furosemide 40 MG tablet Commonly known as:  LASIX Take 1 tablet (40 mg total) by mouth 2 (two) times daily.   glucose blood test strip Commonly known as:  FREESTYLE TEST STRIPS Use as instructed   glucose monitoring kit  monitoring kit 1 each by Does not apply route 4 (four) times daily - after meals and at bedtime. 1 month Diabetic Testing Supplies for QAC-QHS accuchecks.   insulin aspart 100 UNIT/ML FlexPen Commonly known as:  NOVOLOG FLEXPEN 0-20 Units, Subcutaneous, 3 times daily with meals CBG < 70: implement hypoglycemia protocol CBG 70 - 120: 0 units CBG 121 - 150: 3 units CBG 151 - 200: 4 units CBG 201 - 250: 7 units CBG 251 - 300: 11 units CBG 301 - 350: 15 units CBG 351 - 400: 20 units CBG > 400: call MD   Insulin Pen Needle 32G X 8 MM Misc Use as directed   isosorbide-hydrALAZINE 20-37.5 MG tablet Commonly known as:  BIDIL Take 2 tablets by mouth 3 (three) times daily.   LANTUS SOLOSTAR 100 UNIT/ML Solostar Pen Generic drug:  Insulin Glargine Inject 50 Units into the skin daily at 10 pm.   metFORMIN 1000 MG tablet Commonly known as:  GLUCOPHAGE TAKE 1 TABLET(1000 MG) BY MOUTH TWICE DAILY WITH A MEAL   naproxen sodium 220 MG tablet Commonly known as:  ALEVE Take 220 mg daily as needed by mouth (pain).   Oxycodone HCl 10 MG Tabs Take 10 mg by mouth 3 (three) times daily.   Potassium Chloride ER 20 MEQ Tbcr Take 40 mEq by mouth daily.   ENTRESTO 49-51 MG Generic drug:  sacubitril-valsartan Take 1 tablet by mouth 2 (two) times daily.   sacubitril-valsartan 97-103 MG Commonly known as:  ENTRESTO Take 1 tablet by mouth 2 (two) times daily.   spironolactone 25 MG tablet Commonly known as:  ALDACTONE Take 1 tablet (25 mg total) by mouth daily. What changed:  Another medication with the same name was removed. Continue taking this medication, and follow the directions you see here.       Disposition: Pt is being discharged home today in good condition. Discharge Instructions    Diet - low sodium heart healthy   Complete by:  As directed    Increase activity slowly   Complete by:  As directed      Follow-up Information    [facility] [place] [facility] Follow up on 02/17/2017.   Specialty:  Cardiology Why:  at 1100 am for post hospital follow up. Code for parking is 9000. Contact information: [address] [id number] mc [place]       Evans Lance, MD Follow up on 03/03/2017.   Specialty:  Cardiology Why:  at 12:15 Contact information: [address] [place] [address] [phone]           Duration of Discharge Encounter: Greater than 30 minutes including physician time.  Signed, Chanetta Marshall, NP 02/12/2017 11:12 AM  Addendum Pt with nausea and vomiting this afternoon.  + abdominal pain, without rebound or guarding.  + BS. Will keep in the hospital another night for observation.  Hopefully to discharge to home tomorrow  Thompson Grayer MD, [facility] 02/12/2017 5:50 PM

## 2017-02-12 NOTE — Care Management Note (Signed)
Case Management Note  Patient Details  Name: [name] MRN: [id number] Date of Birth: Dec 24, 1950  Subjective/Objective: Pt presented for Atrial Fib-plan for follow up outpatient and d/c home today. PTA Independent from home.                    Action/Plan: No needs identified by CM at this time.   Expected Discharge Date:  02/12/17               Expected Discharge Plan:  Home/Self Care  In-House Referral:  NA  Discharge planning Services  CM Consult  Post Acute Care Choice:  NA Choice offered to:  NA  DME Arranged:  N/A DME Agency:  NA  HH Arranged:  NA HH Agency:  NA  Status of Service:  Completed, signed off  If discussed at [place] of Stay Meetings, dates discussed:    Additional Comments:  Bethena Roys, RN 02/12/2017, 11:57 AM

## 2017-02-13 DIAGNOSIS — I480 Paroxysmal atrial fibrillation: Secondary | ICD-10-CM | POA: Diagnosis not present

## 2017-02-13 DIAGNOSIS — R072 Precordial pain: Secondary | ICD-10-CM | POA: Diagnosis not present

## 2017-02-13 LAB — BASIC METABOLIC PANEL
Anion gap: 10 (ref 5–15)
BUN: 20 mg/dL (ref 6–20)
CO2: 27 mmol/L (ref 22–32)
Calcium: 9.2 mg/dL (ref 8.9–10.3)
Chloride: 102 mmol/L (ref 101–111)
Creatinine, Ser: 1.14 mg/dL — ABNORMAL HIGH (ref 0.44–1.00)
GFR calc Af Amer: 58 mL/min — ABNORMAL LOW (ref >60)
GFR calc non Af Amer: 50 mL/min — ABNORMAL LOW (ref >60)
Glucose, Bld: 187 mg/dL — ABNORMAL HIGH (ref 65–99)
Potassium: 3.5 mmol/L (ref 3.5–5.1)
Sodium: 139 mmol/L (ref 135–145)

## 2017-02-13 LAB — GLUCOSE, CAPILLARY: Glucose-Capillary: 140 mg/dL — ABNORMAL HIGH (ref 65–99)

## 2017-02-13 NOTE — Progress Notes (Signed)
Progress Note  Patient Name: [name] Date of Encounter: 02/13/2017  Primary Cardiologist: No primary care provider on file.   Subjective   She feels well today.  Thinks the food yesterday AM didn't agree with her.  No ongoing nausea or vomiting, no CP, palpitations or SOB, she wants to go home.  Inpatient Medications    Scheduled Meds: . apixaban  5 mg Oral BID  . atorvastatin  40 mg Oral Daily  . carvedilol  25 mg Oral BID WC  . furosemide  40 mg Intravenous BID  . insulin aspart  0-15 Units Subcutaneous TID WC  . insulin glargine  50 Units Subcutaneous Q2200  . isosorbide-hydrALAZINE  2 tablet Oral TID  . mirtazapine  7.5 mg Oral Once  . oxyCODONE  10 mg Oral TID  . potassium chloride SA  40 mEq Oral Daily  . sacubitril-valsartan  1 tablet Oral BID  . sodium chloride flush  3 mL Intravenous Q12H  . spironolactone  25 mg Oral Daily   Continuous Infusions: . sodium chloride     PRN Meds: sodium chloride, acetaminophen, albuterol, metoprolol tartrate, ondansetron (ZOFRAN) IV, sodium chloride flush   Vital Signs    Vitals:   02/12/17 0608 02/12/17 1422 02/12/17 1927 02/13/17 0423  BP: 97/80 (!) 126/54 134/71 (!) 97/53  Pulse: 99 80 85 69  Resp: 16 18 19 17   Temp: 98.3 F (36.8 C) 97.9 F (36.6 C) 98.4 F (36.9 C) 98.7 F (37.1 C)  TempSrc: Oral Oral Oral Oral  SpO2: 96% 98% 100% 100%  Weight:    229 lb 4.8 oz (104 kg)  Height:        Intake/Output Summary (Last 24 hours) at 02/13/2017 0844 Last data filed at 02/12/2017 2127 Gross per 24 hour  Intake 723 ml  Output -  Net 723 ml   Filed Weights   02/11/17 1430 02/12/17 0131 02/13/17 0423  Weight: 230 lb (104.3 kg) 230 lb 8 oz (104.6 kg) 229 lb 4.8 oz (104 kg)    Telemetry    SR  70's-80's generally - Personally Reviewed  ECG    No new EKGs - Personally Reviewed  Physical Exam   GEN: No acute distress.   Neck: No JVD Cardiac: RRR, no murmurs, rubs, or gallops.  Respiratory: CTA  b/l. GI: Soft, nontender, non-distended  MS: No edema; No deformity. Neuro:  Nonfocal  Psych: Normal affect   Labs    Chemistry Recent Labs  Lab 02/11/17 1345 02/12/17 0730 02/13/17 0424  NA 137 139 139  K 3.8 3.8 3.5  CL 98* 104 102  CO2 25 25 27   GLUCOSE 405* 251* 187*  BUN 19 20 20   CREATININE 1.17* 1.02* 1.14*  CALCIUM 9.6 9.2 9.2  GFRNONAA 48* 57* 50*  GFRAA 56* >60 58*  ANIONGAP 14 10 10      Hematology Recent Labs  Lab 02/11/17 1345  WBC 9.1  RBC 4.11  HGB 12.7  HCT 36.3  MCV 88.3  MCH 30.9  MCHC 35.0  RDW 12.4  PLT 426*    Cardiac EnzymesNo results for input(s): TROPONINI in the last 168 hours.  Recent Labs  Lab 02/11/17 1357 02/11/17 1823  TROPIPOC 0.01 0.02     BNP Recent Labs  Lab 02/11/17 1345  BNP 195.6*     DDimer No results for input(s): DDIMER in the last 168 hours.   Radiology      Cardiac Studies   07/15/16: TTE Study Conclusions - Left  ventricle: The cavity size was normal. Wall thickness was   increased in a pattern of moderate LVH. Systolic function was   mildly reduced. The estimated ejection fraction was in the range   of 45% to 50%. Diffuse hypokinesis. Doppler parameters are   consistent with abnormal left ventricular relaxation (grade 1   diastolic dysfunction). - Aortic valve: There was no stenosis. - Mitral valve: Mildly calcified annulus. There was no significant   regurgitation. - Right ventricle: The cavity size was normal. Pacer wire or   catheter noted in right ventricle. Systolic function was mildly   reduced. - Tricuspid valve: Peak RV-RA gradient (S): 21 mm Hg. - Pulmonary arteries: PA peak pressure: 24 mm Hg (S). - Inferior vena cava: The vessel was normal in size. The   respirophasic diameter changes were in the normal range (>= 50%),   consistent with normal central venous pressure. Impressions: - Normal LV size with moderate LV hypertrophy. EF 45-50%, mild   diffuse hypokinesis. Normal RV size  with mildly decreased   systolic function. No significant valvular abnormalities.   Patient Profile     67 y.o. female has a h/o a non-ischemic CM and chronic systolic heart failure and LBBB and underweent BiV ICD insertion in 2014. She has a h/o PAF admitted with increased SOB from baseline in rapid AFib.    Assessment & Plan    1. NICM, chronic CHF     Diuresed, exam does not suggest fluid OL this AM  2. PAFib w/RVR     CHA2DS2Vasc is 5, on eliquis     Spontaneous return of SR here, low AF burden by device interrogation     Home BB was increased  3. CRT-D     Device interrogation done noting chronically elevated sinus rates and flat Optivol  4. N/V episode yesterday held discharge     Remains resolved   Plan for discharge after [name] sees the patient this AM, refer to discharge summary yesterday for medicines and follow up already in place    For questions or updates, please contact [place] Please consult [email or web address] for contact info under Cardiology/STEMI.      Signed, Baldwin [place], PA-C  02/13/2017, 8:44 AM    EP Attending  Patient seen and examined. Agree with above. She is much improved with resolution of her nausea and is stable for DC home with usual followup.   Mikle Bosworth.D.

## 2017-02-13 NOTE — Care Management Obs Status (Signed)
[facility] NOTIFICATION   Patient Details  Name: SHADY PADRON MRN: [id number] Date of Birth: 1950-06-07   Medicare Observation Status Notification Given:  Yes    Bethena Roys, RN 02/13/2017, 10:05 AM

## 2017-02-16 ENCOUNTER — Encounter: Payer: Self-pay | Admitting: *Deleted

## 2017-02-17 ENCOUNTER — Ambulatory Visit (HOSPITAL_COMMUNITY)
Admission: RE | Admit: 2017-02-17 | Discharge: 2017-02-17 | Disposition: A | Discharge status: Home / Self Care | Payer: Medicare Other | Source: Ambulatory Visit | Attending: Cardiology | Admitting: Cardiology

## 2017-02-17 ENCOUNTER — Encounter (HOSPITAL_COMMUNITY): Payer: Self-pay

## 2017-02-17 VITALS — BP 130/72 | HR 94 | Wt 230.8 lb

## 2017-02-17 DIAGNOSIS — Z794 Long term (current) use of insulin: Secondary | ICD-10-CM | POA: Diagnosis not present

## 2017-02-17 DIAGNOSIS — E119 Type 2 diabetes mellitus without complications: Secondary | ICD-10-CM | POA: Diagnosis not present

## 2017-02-17 DIAGNOSIS — I2510 Atherosclerotic heart disease of native coronary artery without angina pectoris: Secondary | ICD-10-CM

## 2017-02-17 DIAGNOSIS — I480 Paroxysmal atrial fibrillation: Secondary | ICD-10-CM

## 2017-02-17 DIAGNOSIS — I429 Cardiomyopathy, unspecified: Secondary | ICD-10-CM | POA: Insufficient documentation

## 2017-02-17 DIAGNOSIS — I110 Hypertensive heart disease with heart failure: Secondary | ICD-10-CM | POA: Diagnosis not present

## 2017-02-17 DIAGNOSIS — E785 Hyperlipidemia, unspecified: Secondary | ICD-10-CM | POA: Insufficient documentation

## 2017-02-17 DIAGNOSIS — I10 Essential (primary) hypertension: Secondary | ICD-10-CM | POA: Diagnosis not present

## 2017-02-17 DIAGNOSIS — I5022 Chronic systolic (congestive) heart failure: Secondary | ICD-10-CM | POA: Insufficient documentation

## 2017-02-17 DIAGNOSIS — Z79899 Other long term (current) drug therapy: Secondary | ICD-10-CM | POA: Insufficient documentation

## 2017-02-17 DIAGNOSIS — Z7901 Long term (current) use of anticoagulants: Secondary | ICD-10-CM | POA: Diagnosis not present

## 2017-02-17 LAB — BASIC METABOLIC PANEL
Anion gap: 12 (ref 5–15)
BUN: 12 mg/dL (ref 6–20)
CO2: 27 mmol/L (ref 22–32)
Calcium: 9.2 mg/dL (ref 8.9–10.3)
Chloride: 100 mmol/L — ABNORMAL LOW (ref 101–111)
Creatinine, Ser: 0.98 mg/dL (ref 0.44–1.00)
GFR calc Af Amer: >60 mL/min (ref >60)
GFR calc non Af Amer: 60 mL/min — ABNORMAL LOW (ref >60)
Glucose, Bld: 270 mg/dL — ABNORMAL HIGH (ref 65–99)
Potassium: 3.8 mmol/L (ref 3.5–5.1)
Sodium: 139 mmol/L (ref 135–145)

## 2017-02-17 MED ORDER — FUROSEMIDE 40 MG PO TABS: 40.0000 mg | ORAL_TABLET | Freq: Every day | ORAL | 3 refills | Status: DC

## 2017-02-17 NOTE — Patient Instructions (Signed)
HOLD Lasix until Thursday. Starting Thursday, new daily dose will be 40 mg tablet ONCE daily.  Routine lab work today. Will notify you of abnormal results, otherwise no news is good news!  Follow up 4 weeks with Oda Kilts PA-C.  Take all medication as prescribed the day of your appointment. Bring all medications with you to your appointment.  Do the following things EVERYDAY: 1) Weigh yourself in the morning before breakfast. Write it down and keep it in a log. 2) Take your medicines as prescribed 3) Eat low salt foods-Limit salt (sodium) to 2000 mg per day.  4) Stay as active as you can everyday 5) Limit all fluids for the day to less than 2 liters

## 2017-02-17 NOTE — Progress Notes (Addendum)
Patient ID: [name], female   DOB: 1950/05/29, 67 y.o.   MRN: [id number]   ADVANCED HF CLINIC NOTE  PCP: Dr. Alyson Ingles Cardiology: Dr Aundra Dubin  17 yo with history of nonischemic cardiomyopathy.  She was admitted with CHF exacerbation in 02/2012.  EF 20-25% on echo, LHC with nonobstructive CAD.  She was started on cardiac meds and discharged.  In 07/2012, she was admitted again with CHF exacerbation.  She had run out of Lasix.  She was taking her other heart medications as ordered, however.  She was diuresed and discharged. She had a chronic LBBB, and Medtronic CRT-D device was placed in 10/14.  She was admitted in 6/16 with hypertensive emergency and CHF exacerbation.   Also of note, she had PFTs in 9/16 showing restrictive spirometry with low lung volume and DLCO.  She was supposed to get a high resolution CT to evaluate for interstitial lung disease but never had the study.     Admitted March 2018 with urosepsis--> E Coli Bacteremia. Completed antibiotic course. EF was down from previous to 30-35%. Also had atrial fibrillation so she was loaded on amiodarone. Placed on eliquis. Discharge weight was 208 pounds.  She is now off amiodarone.   Admitted to [facility] 1/16 -> 1/17 with CP and dizziness. Found to be in Afib. Converted spontaneously overnight with BB and diuresis.   She returns today for post hospital follow up. Remains lightheaded with standing. Mildly orthostatic on exam. She feels lightheadedness after taking her morning medications. Has had several falls without injury. She is in a hurry today because her sister is having a mastectomy over at [facility] long. She has mild SOB with exertion, can get around on flat ground and with her ADLs without much difficulty. Only has mild CP when she has palpitations/Afib. None since discharge.   Optivol: No further Afib. Pt activity > 2 hrs daily. Fluid very mildly elevated on optivol. Does not correlate with exam.  Labs (2/14): SPEP negative, UPEP  negative, HIV negative  PMH: 1. HTN 2. Type II diabetes 3. Nonischemic cardiomyopathy: ? Due to HTN versus LBBB CMP.  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  Echo (2/14) with EF 20-25%.  Echo (7/14) with EF 25%, diffuse hypokinesis.  HIV, SPEP, UPEP negative.  Has LBBB. CRT-D 10/2012 (Medtronic).  Echo (4/15) with EF 45-50%, mild diffuse hypokinesis, PA systolic pressure 38 mmHg.  Echo (6/16) with EF 40-45%, mild LVH, septal and inferior hypokinesis.   - Echo (3/18): EF 30-35%. Grade 1 DD - Echo (6/18): EF 45-50%, moderate LVH, normal RV size with mildly decreased systolic function.  4. Chronic LBBB 5. Right TKR 6. Bilateral THR.  7. Hyperlipidemia 8. ?COPD: Has oxygen for use with exertion.  - PFTs (9/16) with FEV1 77%, FVC 76%, ratio 101%, TLC 63%, DLCO 41% => moderate restrictive deficit  9. Atrial fibrillation: Paroxysmal.   SH: Prior smoker, quit 2/14.  Never drank ETOH.  No drugs. Lives with son.   FH: Mother with "heart trouble."   Review of systems complete and found to be negative unless listed in HPI.    Current Outpatient Medications  Medication Sig Dispense Refill  . acetaminophen (TYLENOL) 500 MG tablet Take 1 tablet (500 mg total) by mouth every 6 (six) hours as needed. 30 tablet 0  . albuterol (PROVENTIL HFA;VENTOLIN HFA) 108 (90 Base) MCG/ACT inhaler Inhale 2 puffs into the lungs every 6 (six) hours as needed for wheezing or shortness of breath. 18 g 0  . apixaban (ELIQUIS)  5 MG TABS tablet Take 1 tablet (5 mg total) by mouth 2 (two) times daily. 60 tablet 3  . atorvastatin (LIPITOR) 40 MG tablet Take 1 tablet (40 mg total) by mouth daily. 30 tablet 3  . carvedilol (COREG) 12.5 MG tablet Take 2 tablets (25 mg total) by mouth 2 (two) times daily with a meal. 270 tablet 3  . furosemide (LASIX) 40 MG tablet Take 1 tablet (40 mg total) by mouth 2 (two) times daily. 180 tablet 3  . glucose blood (FREESTYLE TEST STRIPS) test strip Use as instructed 100 each 0  . glucose  monitoring kit (FREESTYLE) monitoring kit 1 each by Does not apply route 4 (four) times daily - after meals and at bedtime. 1 month Diabetic Testing Supplies for QAC-QHS accuchecks. 1 each 1  . insulin aspart (NOVOLOG FLEXPEN) 100 UNIT/ML FlexPen 0-20 Units, Subcutaneous, 3 times daily with meals CBG < 70: implement hypoglycemia protocol CBG 70 - 120: 0 units CBG 121 - 150: 3 units CBG 151 - 200: 4 units CBG 201 - 250: 7 units CBG 251 - 300: 11 units CBG 301 - 350: 15 units CBG 351 - 400: 20 units CBG > 400: call MD 15 mL 0  . Insulin Glargine (LANTUS SOLOSTAR) 100 UNIT/ML Solostar Pen Inject 50 Units into the skin daily at 10 pm.    . Insulin Pen Needle 32G X 8 MM MISC Use as directed 100 each 0  . isosorbide-hydrALAZINE (BIDIL) 20-37.5 MG tablet Take 2 tablets by mouth 3 (three) times daily. 180 tablet 6  . Lancets (FREESTYLE) lancets Use as instructed 100 each 0  . metFORMIN (GLUCOPHAGE) 1000 MG tablet TAKE 1 TABLET(1000 MG) BY MOUTH TWICE DAILY WITH A MEAL 60 tablet 0  . naproxen sodium (ALEVE) 220 MG tablet Take 220 mg daily as needed by mouth (pain).    . Oxycodone HCl 10 MG TABS Take 10 mg by mouth 3 (three) times daily.  0  . potassium chloride 20 MEQ TBCR Take 40 mEq by mouth daily. 60 tablet 3  . sacubitril-valsartan (ENTRESTO) 49-51 MG Take 1 tablet by mouth 2 (two) times daily.    . sacubitril-valsartan (ENTRESTO) 97-103 MG Take 1 tablet by mouth 2 (two) times daily. 60 tablet 6  . spironolactone (ALDACTONE) 25 MG tablet Take 1 tablet (25 mg total) by mouth daily. 30 tablet 3   No current facility-administered medications for this encounter.    Vitals:   02/17/17 1054  BP: 130/72  Pulse: 94  SpO2: 93%  Weight: 230 lb 12.8 oz (104.7 kg)   Filed Weights   02/17/17 1054  Weight: 230 lb 12.8 oz (104.7 kg)   Physical exam General: Well appearing. No resp difficulty. HEENT: Normal Neck: Supple. JVP flat. Carotids 2+ bilat; no bruits. No thyromegaly or nodule noted. Cor:  PMI nondisplaced. RRR, No M/G/R noted Lungs: CTAB, normal effort. Abdomen: Soft, non-tender, non-distended, no HSM. No bruits or masses. +BS  Extremities: No cyanosis, clubbing, or rash. R and LLE no edema.  Neuro: Alert & orientedx3, cranial nerves grossly intact. moves all 4 extremities w/o difficulty. Affect pleasant   Assessment/Plan: 1. Chronic systolic CHF: Nonischemic cardiomyopathy, HTN. S/P CRT-D (Medtronic). Echo 06/2016 with improved EF 45-50%. - NYHA II-III. Mildly orthostatic on exam.  - Volume status dry.  - Decrease lasix to 40 mg daily. Can take extra in evening as needed.  - Continue Coreg 25 mg BID.  - Continue Entresto 97/103 mg BID - Continue bidil 2 tabs TID -  Continue spiro 25 mg daily.  - Reinforced fluid restriction to < 2 L daily, sodium restriction to less than 2000 mg daily, and the importance of daily weights.    2. Hyperlipidemia:  - Continue statin.   3. HTN:  - Mildly elevated but orthostatic. Meds as above.   4. PAF: -  - Pulse slightly irregular. Remains in NSR by Medtronic CRT-D - Continue Eliquis for anticoagulation. Denies bleeding.   BMET today. Meds as above. RTC 4 weeks.    Lourine Friar, PA-C  11:08 AM  02/17/2017  Greater than 50% of the 25 minute visit was spent in counseling/coordination of care regarding disease state education, salt/fluid restriction, sliding scale diuretics, and medication compliance.

## 2017-02-17 NOTE — Addendum Note (Signed)
Encounter addended by: Earlisha Friar, PA-C on: 02/17/2017 11:41 AM  Actions taken: Sign clinical note

## 2017-02-20 ENCOUNTER — Telehealth (HOSPITAL_COMMUNITY): Payer: Self-pay

## 2017-02-24 ENCOUNTER — Other Ambulatory Visit (HOSPITAL_COMMUNITY): Payer: Self-pay

## 2017-02-24 ENCOUNTER — Telehealth (HOSPITAL_COMMUNITY): Payer: Self-pay | Admitting: *Deleted

## 2017-02-24 NOTE — Progress Notes (Signed)
Paramedicine Encounter    Patient ID: [name], female    DOB: 1950-02-08, 67 y.o.   MRN: [id number]    Patient Care Team: [name], MD as PCP - General (Family Medicine)  Patient Active Problem List   Diagnosis Date Noted  . Atrial fibrillation ([place]) 02/11/2017  . Paroxysmal A-fib ([place])   . Biventricular automatic implantable cardioverter defibrillator in situ   . Sepsis ([place]) 04/20/2016  . UTI (urinary tract infection) 04/20/2016  . Dyspnea 07/19/2015  . Interstitial lung disease ([place]) 11/20/2014  . SIRS (systemic inflammatory response syndrome) ([place]) 02/03/2014  . IDDM (insulin dependent diabetes mellitus) ([place]) 02/03/2014  . Tachycardia 06/14/2013  . Chronic systolic CHF (congestive heart failure) ([place]) 09/29/2012  . Hypoxia 03/06/2012  . Hypertensive heart disease 03/06/2012  . Nonischemic cardiomyopathy ([place]) 03/06/2012  . Tobacco abuse 03/06/2012  . Type 2 diabetes mellitus ([place]) 03/06/2012  . Hypertension 03/06/2012  . CAD (coronary artery disease), native coronary artery 03/06/2012  . Hypokalemia 03/06/2012  . Acute bronchitis 02/01/2009  . Sleep apnea 02/01/2009  . Chest pain 02/01/2009    Current Outpatient Medications:  .  apixaban (ELIQUIS) 5 MG TABS tablet, Take 1 tablet (5 mg total) by mouth 2 (two) times daily., Disp: 60 tablet, Rfl: 3 .  atorvastatin (LIPITOR) 40 MG tablet, Take 1 tablet (40 mg total) by mouth daily., Disp: 30 tablet, Rfl: 3 .  carvedilol (COREG) 12.5 MG tablet, Take 2 tablets (25 mg total) by mouth 2 (two) times daily with a meal., Disp: 270 tablet, Rfl: 3 .  furosemide (LASIX) 40 MG tablet, Take 1 tablet (40 mg total) by mouth daily., Disp: 90 tablet, Rfl: 3 .  isosorbide-hydrALAZINE (BIDIL) 20-37.5 MG tablet, Take 2 tablets by mouth 3 (three) times daily., Disp: 180 tablet, Rfl: 6 .  metFORMIN (GLUCOPHAGE) 1000 MG tablet, TAKE 1 TABLET(1000 MG) BY MOUTH TWICE DAILY WITH A MEAL, Disp: 60 tablet, Rfl: 0 .  potassium chloride  20 MEQ TBCR, Take 40 mEq by mouth daily., Disp: 60 tablet, Rfl: 3 .  sacubitril-valsartan (ENTRESTO) 97-103 MG, Take 1 tablet by mouth 2 (two) times daily., Disp: 60 tablet, Rfl: 6 .  spironolactone (ALDACTONE) 25 MG tablet, Take 1 tablet (25 mg total) by mouth daily., Disp: 30 tablet, Rfl: 3 .  acetaminophen (TYLENOL) 500 MG tablet, Take 1 tablet (500 mg total) by mouth every 6 (six) hours as needed., Disp: 30 tablet, Rfl: 0 .  albuterol (PROVENTIL HFA;VENTOLIN HFA) 108 (90 Base) MCG/ACT inhaler, Inhale 2 puffs into the lungs every 6 (six) hours as needed for wheezing or shortness of breath., Disp: 18 g, Rfl: 0 .  glucose blood (FREESTYLE TEST STRIPS) test strip, Use as instructed, Disp: 100 each, Rfl: 0 .  glucose monitoring kit (FREESTYLE) monitoring kit, 1 each by Does not apply route 4 (four) times daily - after meals and at bedtime. 1 month Diabetic Testing Supplies for QAC-QHS accuchecks., Disp: 1 each, Rfl: 1 .  insulin aspart (NOVOLOG FLEXPEN) 100 UNIT/ML FlexPen, 0-20 Units, Subcutaneous, 3 times daily with meals CBG < 70: implement hypoglycemia protocol CBG 70 - 120: 0 units CBG 121 - 150: 3 units CBG 151 - 200: 4 units CBG 201 - 250: 7 units CBG 251 - 300: 11 units CBG 301 - 350: 15 units CBG 351 - 400: 20 units CBG > 400: call MD, Disp: 15 mL, Rfl: 0 .  Insulin Glargine (LANTUS SOLOSTAR) 100 UNIT/ML Solostar Pen, Inject 50 Units into the skin daily at 10  pm., Disp: , Rfl:  .  Insulin Pen Needle 32G X 8 MM MISC, Use as directed, Disp: 100 each, Rfl: 0 .  Lancets (FREESTYLE) lancets, Use as instructed, Disp: 100 each, Rfl: 0 .  naproxen sodium (ALEVE) 220 MG tablet, Take 220 mg daily as needed by mouth (pain)., Disp: , Rfl:  .  Oxycodone HCl 10 MG TABS, Take 10 mg by mouth 3 (three) times daily., Disp: , Rfl: 0 Allergies  Allergen Reactions  . Morphine And Related Nausea And Vomiting and Other (See Comments)    Severe nausea     Social History   Socioeconomic History  . Marital  status: Single    Spouse name: Not on file  . Number of children: Not on file  . Years of education: Not on file  . Highest education level: Not on file  Social Needs  . Financial resource strain: Not on file  . Food insecurity - worry: Not on file  . Food insecurity - inability: Not on file  . Transportation needs - medical: Not on file  . Transportation needs - non-medical: Not on file  Occupational History  . Not on file  Tobacco Use  . Smoking status: Former Smoker    Packs/day: 1.00    Years: 40.00    Pack years: 40.00    Types: Cigarettes    Start date: 06/23/1972    Last attempt to quit: 03/26/2012    Years since quitting: 4.9  . Smokeless tobacco: Never Used  Substance and Sexual Activity  . Alcohol use: No  . Drug use: No  . Sexual activity: Yes  Other Topics Concern  . Not on file  Social History Narrative  . Not on file    Physical Exam  Pulmonary/Chest: No respiratory distress. She has no wheezes.  Abdominal: She exhibits no distension. There is no tenderness. There is no rebound.  Musculoskeletal: She exhibits no edema.  Skin: Skin is warm and dry. She is not diaphoretic.        Future Appointments  Date Time Provider [place]  03/03/2017 12:15 PM Evans Lance, MD [facility]  03/17/2017  9:00 AM [facility] PA/NP [facility] None    ATF pt CAO x4 laying across the bed with no complaints.  Today is our first visit since she went to the emergency room and the heart failure clinic since her medications were revised.  Pt cancelled our appointment last Friday.  Pt stated that she feels a lot better and she has taken her medications.  She stated that she had taken the lasix out of the afternoon pills but she still had them in the afternoon slot. Pt insists that she didn't take the second dose.  Pt's son stated that he has been working with her on her diet.  Pt denies sob, chest pain and dizziness.  rx bottles verified and pill box refilled.  Pt  weighed herself today in the kitchen and I advised her to weigh in the same place daily. She said that she would.    BP 114/76 (BP Location: Right Arm, Patient Position: Sitting, Cuff Size: Normal)   Pulse 97   Resp 16   Wt 227 lb 9.6 oz (103.2 kg)   SpO2 100%   BMI 35.65 kg/m   cbg 357 Pt took about to take 16 units insulin and she will recheck it later   **rx called in:  eliquis Potassium bidil  Gjon Letarte, EMT Paramedic 02/24/2017    ACTION: Home  visit completed

## 2017-02-24 NOTE — Telephone Encounter (Signed)
Patient is to continue taking Entresto 97-103 mg per Barrington Ellison PA's last office note.

## 2017-02-24 NOTE — Telephone Encounter (Signed)
I called pt earlier this week to schedule a time for me to revise her pill box due to the recent med changes.  A. Tillery advised pt to stop taking the lasix for a couple of days and start taking them once daily.    We scheduled a CHP visit but pt is not home. She states that she didn't take the "fluid pill until today".  She stated that she took the second dose out of the pill box and she knows what to take. I will f/u with pt next week.

## 2017-03-01 ENCOUNTER — Emergency Department (HOSPITAL_COMMUNITY): Payer: Medicare Other

## 2017-03-01 ENCOUNTER — Encounter (HOSPITAL_COMMUNITY): Payer: Self-pay | Admitting: Emergency Medicine

## 2017-03-01 ENCOUNTER — Other Ambulatory Visit: Payer: Self-pay

## 2017-03-01 ENCOUNTER — Emergency Department (HOSPITAL_COMMUNITY)
Admission: EM | Admit: 2017-03-01 | Discharge: 2017-03-01 | Disposition: A | Discharge status: Home / Self Care | Payer: Medicare Other | Attending: Emergency Medicine | Admitting: Emergency Medicine

## 2017-03-01 DIAGNOSIS — E1165 Type 2 diabetes mellitus with hyperglycemia: Secondary | ICD-10-CM | POA: Insufficient documentation

## 2017-03-01 DIAGNOSIS — Z79899 Other long term (current) drug therapy: Secondary | ICD-10-CM | POA: Insufficient documentation

## 2017-03-01 DIAGNOSIS — Z87891 Personal history of nicotine dependence: Secondary | ICD-10-CM | POA: Diagnosis not present

## 2017-03-01 DIAGNOSIS — I5022 Chronic systolic (congestive) heart failure: Secondary | ICD-10-CM | POA: Diagnosis not present

## 2017-03-01 DIAGNOSIS — J189 Pneumonia, unspecified organism: Secondary | ICD-10-CM

## 2017-03-01 DIAGNOSIS — I2510 Atherosclerotic heart disease of native coronary artery without angina pectoris: Secondary | ICD-10-CM | POA: Diagnosis not present

## 2017-03-01 DIAGNOSIS — Z96643 Presence of artificial hip joint, bilateral: Secondary | ICD-10-CM | POA: Diagnosis not present

## 2017-03-01 DIAGNOSIS — R51 Headache: Secondary | ICD-10-CM | POA: Diagnosis present

## 2017-03-01 DIAGNOSIS — Z96651 Presence of right artificial knee joint: Secondary | ICD-10-CM | POA: Insufficient documentation

## 2017-03-01 DIAGNOSIS — I110 Hypertensive heart disease with heart failure: Secondary | ICD-10-CM | POA: Diagnosis not present

## 2017-03-01 DIAGNOSIS — Z794 Long term (current) use of insulin: Secondary | ICD-10-CM | POA: Insufficient documentation

## 2017-03-01 DIAGNOSIS — R739 Hyperglycemia, unspecified: Secondary | ICD-10-CM

## 2017-03-01 DIAGNOSIS — I10 Essential (primary) hypertension: Secondary | ICD-10-CM

## 2017-03-01 LAB — BASIC METABOLIC PANEL
Anion gap: 11 (ref 5–15)
BUN: 13 mg/dL (ref 6–20)
CO2: 28 mmol/L (ref 22–32)
Calcium: 9 mg/dL (ref 8.9–10.3)
Chloride: 100 mmol/L — ABNORMAL LOW (ref 101–111)
Creatinine, Ser: 0.93 mg/dL (ref 0.44–1.00)
GFR calc Af Amer: >60 mL/min (ref >60)
GFR calc non Af Amer: >60 mL/min (ref >60)
Glucose, Bld: 289 mg/dL — ABNORMAL HIGH (ref 65–99)
Potassium: 3.5 mmol/L (ref 3.5–5.1)
Sodium: 139 mmol/L (ref 135–145)

## 2017-03-01 LAB — URINALYSIS, ROUTINE W REFLEX MICROSCOPIC
Bilirubin Urine: NEGATIVE
Glucose, UA: >=500 mg/dL — AB
Hgb urine dipstick: NEGATIVE
Ketones, ur: NEGATIVE mg/dL
Nitrite: NEGATIVE
Protein, ur: NEGATIVE mg/dL
Specific Gravity, Urine: 1.016 (ref 1.005–1.030)
pH: 5 (ref 5.0–8.0)

## 2017-03-01 LAB — CBC
HCT: 32.7 % — ABNORMAL LOW (ref 36.0–46.0)
Hemoglobin: 11.2 g/dL — ABNORMAL LOW (ref 12.0–15.0)
MCH: 30.7 pg (ref 26.0–34.0)
MCHC: 34.3 g/dL (ref 30.0–36.0)
MCV: 89.6 fL (ref 78.0–100.0)
Platelets: 331 10*3/uL (ref 150–400)
RBC: 3.65 MIL/uL — ABNORMAL LOW (ref 3.87–5.11)
RDW: 12.8 % (ref 11.5–15.5)
WBC: 6.3 10*3/uL (ref 4.0–10.5)

## 2017-03-01 MED ORDER — BENZONATATE 100 MG PO CAPS: 100.0000 mg | ORAL_CAPSULE | Freq: Three times a day (TID) | ORAL | 0 refills | Status: DC

## 2017-03-01 MED ORDER — ACETAMINOPHEN 325 MG PO TABS
650.0000 mg | ORAL_TABLET | Freq: Once | ORAL | Status: AC
  Administered 2017-03-01: 650 mg via ORAL
  Filled 2017-03-01: qty 2

## 2017-03-01 MED ORDER — ONDANSETRON 4 MG PO TBDP: 4.0000 mg | ORAL_TABLET | Freq: Once | ORAL | Status: DC

## 2017-03-01 MED ORDER — DOXYCYCLINE HYCLATE 100 MG PO CAPS: 100.0000 mg | ORAL_CAPSULE | Freq: Two times a day (BID) | ORAL | 0 refills | Status: DC

## 2017-03-01 MED ORDER — AMOXICILLIN 500 MG PO CAPS: 500.0000 mg | ORAL_CAPSULE | Freq: Three times a day (TID) | ORAL | 0 refills | Status: DC

## 2017-03-01 NOTE — Discharge Instructions (Signed)
Follow-up with your primary care doctor next week as planned, take the antibiotics as prescribed, the Tessalon is a medication to help you with coughing, can take over-the-counter Tylenol or ibuprofen as needed for aches and pains.  Return as needed for worsening symptoms  Continue to monitor your blood sugar and blood pressure

## 2017-03-01 NOTE — ED Provider Notes (Signed)
[place] EMERGENCY DEPARTMENT Provider Note   CSN: [id number] Arrival date & time: 03/01/17  7654     History   Chief Complaint Chief Complaint  Patient presents with  . Headache  . Emesis    HPI [name] is a 67 y.o. female.  HPI Pt states for the last few days she has been having trouible with headaches and back aches.  She has had some nasal congestion and cough.  She has trouble sleepikng at night because of the neasal congestion.  No measured fevers.  She has had occasional vomioting and diarrhea.  She has had some shortness of breath intermittently as well as abdominal pain.  No dysuria.   Past Medical History:  Diagnosis Date  . Anemia    a. Noted on 07/2012 labs, instructed to f/u PCP.  Marland Kitchen Arthritis    "joints" (11/18/2012)  . CAD (coronary artery disease), native coronary artery    a. Nonobstructive by cath 02/2012 (done because of low EF).  . Chronic bronchitis ([place])    "~ every other year" (11/18/2012)  . Chronic combined systolic and diastolic CHF (congestive heart failure) ([place])    a. 03/05/12 echo:  LVEF 20-25%, moderate LVH , inferior and basal to mid septal akinesis, anterior moderate to severe hypokinesis and grade 2 diastolic dysfunction. b. EF 07/2012: EF still 25% (unclear medication compliance).  . Chronic lower back pain   . Headache(784.0)    "often; maybe not daily" (11/18/2012)  . High cholesterol   . History of noncompliance with medical treatment   . Hypertension   . LBBB (left bundle branch block)   . Orthopnea   . Tobacco abuse   . Type II diabetes mellitus [facility])     Patient Active Problem List   Diagnosis Date Noted  . Atrial fibrillation ([place]) 02/11/2017  . Paroxysmal A-fib ([place])   . Biventricular automatic implantable cardioverter defibrillator in situ   . Sepsis ([place]) 04/20/2016  . UTI (urinary tract infection) 04/20/2016  . Dyspnea 07/19/2015  . Interstitial lung disease ([place]) 11/20/2014  . SIRS (systemic  inflammatory response syndrome) ([place]) 02/03/2014  . IDDM (insulin dependent diabetes mellitus) ([place]) 02/03/2014  . Tachycardia 06/14/2013  . Chronic systolic CHF (congestive heart failure) ([place]) 09/29/2012  . Hypoxia 03/06/2012  . Hypertensive heart disease 03/06/2012  . Nonischemic cardiomyopathy ([place]) 03/06/2012  . Tobacco abuse 03/06/2012  . Type 2 diabetes mellitus ([place]) 03/06/2012  . Hypertension 03/06/2012  . CAD (coronary artery disease), native coronary artery 03/06/2012  . Hypokalemia 03/06/2012  . Acute bronchitis 02/01/2009  . Sleep apnea 02/01/2009  . Chest pain 02/01/2009    Past Surgical History:  Procedure Laterality Date  . BI-VENTRICULAR IMPLANTABLE CARDIOVERTER DEFIBRILLATOR N/A 11/18/2012   Procedure: BI-VENTRICULAR IMPLANTABLE CARDIOVERTER DEFIBRILLATOR  (CRT-D);  Surgeon: Evans Lance, MD;  Location: [facility] CATH LAB;  Service: Cardiovascular;  Laterality: N/A;  . BI-VENTRICULAR IMPLANTABLE CARDIOVERTER DEFIBRILLATOR  (CRT-D)  11/18/2012  . CARDIAC CATHETERIZATION  03/04/12   nonobstructive CAD, elevated LVEDP and tortuous vessels suggestive of long-standing hypertension  . COLONOSCOPY WITH PROPOFOL N/A 12/12/2016   Procedure: COLONOSCOPY WITH PROPOFOL;  Surgeon: Carol Ada, MD;  Location: [facility] ENDOSCOPY;  Service: Endoscopy;  Laterality: N/A;  . JOINT REPLACEMENT     Bilateral hip and right knee  . LEFT HEART CATH N/A 03/05/2012   Procedure: LEFT HEART CATH;  Surgeon: Larey Dresser, MD;  Location: [facility] CATH LAB;  Service: Cardiovascular;  Laterality: N/A;    OB History  No data available       Home Medications    Prior to Admission medications   Medication Sig Start Date End Date Taking? Authorizing Provider  acetaminophen (TYLENOL) 500 MG tablet Take 1 tablet (500 mg total) by mouth every 6 (six) hours as needed. 09/15/15   Leo Grosser, MD  albuterol (PROVENTIL HFA;VENTOLIN HFA) 108 (90 Base) MCG/ACT inhaler Inhale 2 puffs into the lungs every 6 (six)  hours as needed for wheezing or shortness of breath. 04/25/16   Ghimire, Henreitta Leber, MD  amoxicillin (AMOXIL) 500 MG capsule Take 1 capsule (500 mg total) by mouth 3 (three) times daily. 03/01/17   Dorie Rank, MD  apixaban (ELIQUIS) 5 MG TABS tablet Take 1 tablet (5 mg total) by mouth 2 (two) times daily. 05/22/16   Clegg, Amy D, NP  atorvastatin (LIPITOR) 40 MG tablet Take 1 tablet (40 mg total) by mouth daily. 05/22/16   Clegg, Amy D, NP  benzonatate (TESSALON) 100 MG capsule Take 1 capsule (100 mg total) by mouth every 8 (eight) hours. 03/01/17   Dorie Rank, MD  carvedilol (COREG) 12.5 MG tablet Take 2 tablets (25 mg total) by mouth 2 (two) times daily with a meal. 02/12/17   Seiler, Cecil Cobbs, NP  doxycycline (VIBRAMYCIN) 100 MG capsule Take 1 capsule (100 mg total) by mouth 2 (two) times daily. 03/01/17   Dorie Rank, MD  furosemide (LASIX) 40 MG tablet Take 1 tablet (40 mg total) by mouth daily. 02/17/17   [place]ais Friar, PA-C  glucose blood (FREESTYLE TEST STRIPS) test strip Use as instructed 04/25/16   Ghimire, Henreitta Leber, MD  glucose monitoring kit (FREESTYLE) monitoring kit 1 each by Does not apply route 4 (four) times daily - after meals and at bedtime. 1 month Diabetic Testing Supplies for QAC-QHS accuchecks. 04/25/16   Ghimire, Henreitta Leber, MD  insulin aspart (NOVOLOG FLEXPEN) 100 UNIT/ML FlexPen 0-20 Units, Subcutaneous, 3 times daily with meals CBG < 70: implement hypoglycemia protocol CBG 70 - 120: 0 units CBG 121 - 150: 3 units CBG 151 - 200: 4 units CBG 201 - 250: 7 units CBG 251 - 300: 11 units CBG 301 - 350: 15 units CBG 351 - 400: 20 units CBG > 400: call MD 04/25/16   Jonetta Osgood, MD  Insulin Glargine (LANTUS SOLOSTAR) 100 UNIT/ML Solostar Pen Inject 50 Units into the skin daily at 10 pm.    [provider]  Insulin Pen Needle 32G X 8 MM MISC Use as directed 06/18/16   Arbutus Leas, NP  isosorbide-hydrALAZINE (BIDIL) 20-37.5 MG tablet Take 2 tablets by mouth 3 (three)  times daily. 09/11/16   Arbutus Leas, NP  Lancets (FREESTYLE) lancets Use as instructed 06/18/16   Arbutus Leas, NP  metFORMIN (GLUCOPHAGE) 1000 MG tablet TAKE 1 TABLET(1000 MG) BY MOUTH TWICE DAILY WITH A MEAL 05/30/16   Heavenly Friar, PA-C  naproxen sodium (ALEVE) 220 MG tablet Take 220 mg daily as needed by mouth (pain).    [provider]  Oxycodone HCl 10 MG TABS Take 10 mg by mouth 3 (three) times daily. 12/09/16   [provider]  potassium chloride 20 MEQ TBCR Take 40 mEq by mouth daily. 10/07/16   Arbutus Leas, NP  sacubitril-valsartan (ENTRESTO) 97-103 MG Take 1 tablet by mouth 2 (two) times daily. 06/04/16   Clegg, Amy D, NP  spironolactone (ALDACTONE) 25 MG tablet Take 1 tablet (25 mg total) by mouth daily. 05/22/16  Darrick Grinder D, NP    Family History Family History  Problem Relation Age of Onset  . Heart disease Neg Hx     Social History Social History   Tobacco Use  . Smoking status: Former Smoker    Packs/day: 1.00    Years: 40.00    Pack years: 40.00    Types: Cigarettes    Start date: 06/23/1972    Last attempt to quit: 03/26/2012    Years since quitting: 4.9  . Smokeless tobacco: Never Used  Substance Use Topics  . Alcohol use: No  . Drug use: No     Allergies   Morphine and related   Review of Systems Review of Systems  All other systems reviewed and are negative.    Physical Exam Updated Vital Signs BP (!) 162/121   Pulse 87   Temp 98.3 F (36.8 C) (Oral)   Resp 16   Ht 1.702 m (_0 )   Wt 103.9 kg (229 lb)   SpO2 96%   BMI 35.87 kg/m   Physical Exam  Constitutional: She appears well-developed and well-nourished. No distress.  HENT:  Head: Normocephalic and atraumatic.  Right Ear: External ear normal.  Left Ear: External ear normal.  Eyes: Conjunctivae are normal. Right eye exhibits no discharge. Left eye exhibits no discharge. No scleral icterus.  Neck: Normal range of motion. Neck supple. No neck rigidity.  No tracheal deviation and normal range of motion present.  Cardiovascular: Normal rate, regular rhythm and intact distal pulses.  Pulmonary/Chest: Effort normal and breath sounds normal. No stridor. No respiratory distress. She has no wheezes. She has no rales.  Abdominal: Soft. Bowel sounds are normal. She exhibits no distension. There is no tenderness. There is no rebound and no guarding.  Musculoskeletal: She exhibits no edema or tenderness.  Neurological: She is alert. She has normal strength. No cranial nerve deficit (no facial droop, extraocular movements intact, no slurred speech) or sensory deficit. She exhibits normal muscle tone. She displays no seizure activity. Coordination normal.  Skin: Skin is warm and dry. No rash noted.  Psychiatric: She has a normal mood and affect.  Nursing note and vitals reviewed.    ED Treatments / Results  Labs (all labs ordered are listed, but only abnormal results are displayed) Labs Reviewed  CBC - Abnormal; Notable for the following components:      Result Value   RBC 3.65 (*)    Hemoglobin 11.2 (*)    HCT 32.7 (*)    All other components within normal limits  BASIC METABOLIC PANEL - Abnormal; Notable for the following components:   Chloride 100 (*)    Glucose, Bld 289 (*)    All other components within normal limits  URINALYSIS, ROUTINE W REFLEX MICROSCOPIC - Abnormal; Notable for the following components:   APPearance HAZY (*)    Glucose, UA >=500 (*)    Leukocytes, UA TRACE (*)    Bacteria, UA RARE (*)    Squamous Epithelial / LPF 6-30 (*)    All other components within normal limits     Radiology Dg Chest 2 View  Result Date: 03/01/2017 CLINICAL DATA:  Cough, congestion EXAM: CHEST  2 VIEW COMPARISON:  02/11/2017 FINDINGS: Prominent right infrahilar markings, worrisome for mild right lower lobe pneumonia. Left lung is clear. No pleural effusion or pneumothorax. Mild cardiomegaly.  Left subclavian ICD. IMPRESSION: Prominent right  infrahilar markings, worrisome for mild right lower lobe pneumonia. Electronically Signed   By: Julian Hy  M.D.   On: 03/01/2017 08:06    Procedures Procedures (including critical care time)  Medications Ordered in ED Medications  ondansetron (ZOFRAN-ODT) disintegrating tablet 4 mg (4 mg Oral Not Given 03/01/17 0929)  acetaminophen (TYLENOL) tablet 650 mg (650 mg Oral Given 03/01/17 0813)     Initial Impression / Assessment and Plan / ED Course  I have reviewed the triage vital signs and the nursing notes.  Pertinent labs & imaging results that were available during my care of the patient were reviewed by me and considered in my medical decision making (see chart for details).   Patient presented to the emergency room with complaints of cough congestion.  Laboratory tests are notable for a mild anemia but this is not significantly changed compared to previous labs.  Patient does have an elevated blood sugar but no signs of acidosis to suggest DKA.  Chest x-ray suggest the possibility of pneumonia.  Plan on discharge home with oral antibiotics.  Final Clinical Impressions(s) / ED Diagnoses   Final diagnoses:  Community acquired pneumonia, unspecified laterality  Hypertension, unspecified type  Hyperglycemia    ED Discharge Orders        Ordered    amoxicillin (AMOXIL) 500 MG capsule  3 times daily     03/01/17 1024    doxycycline (VIBRAMYCIN) 100 MG capsule  2 times daily     03/01/17 1024    benzonatate (TESSALON) 100 MG capsule  Every 8 hours     03/01/17 1024       Dorie Rank, MD 03/01/17 1027

## 2017-03-01 NOTE — ED Triage Notes (Signed)
Pt from home with complaints of a headache that started two days ago. Pt had episodes of vomiting today and yesterday. Pt claims she has back pain and feels weak all over.

## 2017-03-01 NOTE — ED Notes (Signed)
Crackers and milk was given to pt per RN.

## 2017-03-01 NOTE — ED Notes (Signed)
Pt did not want zofran-- requested milk, graham crackers and peanut butter.

## 2017-03-03 ENCOUNTER — Encounter: Payer: Self-pay | Admitting: Internal Medicine

## 2017-03-03 ENCOUNTER — Ambulatory Visit (INDEPENDENT_AMBULATORY_CARE_PROVIDER_SITE_OTHER): Payer: Medicare Other | Admitting: Internal Medicine

## 2017-03-03 VITALS — BP 130/80 | HR 64 | Ht 67.0 in | Wt 235.0 lb

## 2017-03-03 DIAGNOSIS — Z95810 Presence of automatic (implantable) cardiac defibrillator: Secondary | ICD-10-CM

## 2017-03-03 DIAGNOSIS — I5022 Chronic systolic (congestive) heart failure: Secondary | ICD-10-CM

## 2017-03-03 DIAGNOSIS — I428 Other cardiomyopathies: Secondary | ICD-10-CM

## 2017-03-03 DIAGNOSIS — I447 Left bundle-branch block, unspecified: Secondary | ICD-10-CM

## 2017-03-03 NOTE — Progress Notes (Signed)
HPI [name] returns today for followup of her biV ICD and chronic systolic heart failure. She had a nice improvement of her EF after BIV ICD insertion. In the interim, she has done well execpt that she is getting over a cold. No ICD shocks. She c/o nocturia. No PND and no orthopnea.  Allergies  Allergen Reactions  . Morphine And Related Nausea And Vomiting and Other (See Comments)    Severe nausea     Current Outpatient Medications  Medication Sig Dispense Refill  . acetaminophen (TYLENOL) 500 MG tablet Take 1 tablet (500 mg total) by mouth every 6 (six) hours as needed. 30 tablet 0  . albuterol (PROVENTIL HFA;VENTOLIN HFA) 108 (90 Base) MCG/ACT inhaler Inhale 2 puffs into the lungs every 6 (six) hours as needed for wheezing or shortness of breath. 18 g 0  . amoxicillin (AMOXIL) 500 MG capsule Take 1 capsule (500 mg total) by mouth 3 (three) times daily. 21 capsule 0  . apixaban (ELIQUIS) 5 MG TABS tablet Take 1 tablet (5 mg total) by mouth 2 (two) times daily. 60 tablet 3  . atorvastatin (LIPITOR) 40 MG tablet Take 1 tablet (40 mg total) by mouth daily. 30 tablet 3  . benzonatate (TESSALON) 100 MG capsule Take 1 capsule (100 mg total) by mouth every 8 (eight) hours. 21 capsule 0  . carvedilol (COREG) 12.5 MG tablet Take 2 tablets (25 mg total) by mouth 2 (two) times daily with a meal. 270 tablet 3  . doxycycline (VIBRAMYCIN) 100 MG capsule Take 1 capsule (100 mg total) by mouth 2 (two) times daily. 14 capsule 0  . furosemide (LASIX) 40 MG tablet Take 1 tablet (40 mg total) by mouth daily. 90 tablet 3  . glucose blood (FREESTYLE TEST STRIPS) test strip Use as instructed 100 each 0  . glucose monitoring kit (FREESTYLE) monitoring kit 1 each by Does not apply route 4 (four) times daily - after meals and at bedtime. 1 month Diabetic Testing Supplies for QAC-QHS accuchecks. 1 each 1  . insulin aspart (NOVOLOG FLEXPEN) 100 UNIT/ML FlexPen 0-20 Units, Subcutaneous, 3 times daily with  meals CBG < 70: implement hypoglycemia protocol CBG 70 - 120: 0 units CBG 121 - 150: 3 units CBG 151 - 200: 4 units CBG 201 - 250: 7 units CBG 251 - 300: 11 units CBG 301 - 350: 15 units CBG 351 - 400: 20 units CBG > 400: call MD 15 mL 0  . Insulin Glargine (LANTUS SOLOSTAR) 100 UNIT/ML Solostar Pen Inject 50 Units into the skin daily at 10 pm.    . Insulin Pen Needle 32G X 8 MM MISC Use as directed 100 each 0  . isosorbide-hydrALAZINE (BIDIL) 20-37.5 MG tablet Take 2 tablets by mouth 3 (three) times daily. 180 tablet 6  . Lancets (FREESTYLE) lancets Use as instructed 100 each 0  . metFORMIN (GLUCOPHAGE) 1000 MG tablet TAKE 1 TABLET(1000 MG) BY MOUTH TWICE DAILY WITH A MEAL 60 tablet 0  . naproxen sodium (ALEVE) 220 MG tablet Take 220 mg daily as needed by mouth (pain).    . Oxycodone HCl 10 MG TABS Take 10 mg by mouth 3 (three) times daily.  0  . potassium chloride 20 MEQ TBCR Take 40 mEq by mouth daily. 60 tablet 3  . sacubitril-valsartan (ENTRESTO) 97-103 MG Take 1 tablet by mouth 2 (two) times daily. 60 tablet 6  . spironolactone (ALDACTONE) 25 MG tablet Take 1 tablet (25 mg total) by mouth  daily. 30 tablet 3   No current facility-administered medications for this visit.      Past Medical History:  Diagnosis Date  . Anemia    a. Noted on 07/2012 labs, instructed to f/u PCP.  Marland Kitchen Arthritis    "joints" (11/18/2012)  . CAD (coronary artery disease), native coronary artery    a. Nonobstructive by cath 02/2012 (done because of low EF).  . Chronic bronchitis ([place])    "~ every other year" (11/18/2012)  . Chronic combined systolic and diastolic CHF (congestive heart failure) ([place])    a. 03/05/12 echo:  LVEF 20-25%, moderate LVH , inferior and basal to mid septal akinesis, anterior moderate to severe hypokinesis and grade 2 diastolic dysfunction. b. EF 07/2012: EF still 25% (unclear medication compliance).  . Chronic lower back pain   . Headache(784.0)    "often; maybe not daily"  (11/18/2012)  . High cholesterol   . History of noncompliance with medical treatment   . Hypertension   . LBBB (left bundle branch block)   . Orthopnea   . Tobacco abuse   . Type II diabetes mellitus ([facility])     ROS:   All systems reviewed and negative except as noted in the HPI.   Past Surgical History:  Procedure Laterality Date  . BI-VENTRICULAR IMPLANTABLE CARDIOVERTER DEFIBRILLATOR N/A 11/18/2012   Procedure: BI-VENTRICULAR IMPLANTABLE CARDIOVERTER DEFIBRILLATOR  (CRT-D);  Surgeon: Evans Lance, MD;  Location: [facility] CATH LAB;  Service: Cardiovascular;  Laterality: N/A;  . BI-VENTRICULAR IMPLANTABLE CARDIOVERTER DEFIBRILLATOR  (CRT-D)  11/18/2012  . CARDIAC CATHETERIZATION  03/04/12   nonobstructive CAD, elevated LVEDP and tortuous vessels suggestive of long-standing hypertension  . COLONOSCOPY WITH PROPOFOL N/A 12/12/2016   Procedure: COLONOSCOPY WITH PROPOFOL;  Surgeon: Carol Ada, MD;  Location: [facility] ENDOSCOPY;  Service: Endoscopy;  Laterality: N/A;  . JOINT REPLACEMENT     Bilateral hip and right knee  . LEFT HEART CATH N/A 03/05/2012   Procedure: LEFT HEART CATH;  Surgeon: Larey Dresser, MD;  Location: [facility] CATH LAB;  Service: Cardiovascular;  Laterality: N/A;     Family History  Problem Relation Age of Onset  . Heart disease Neg Hx      Social History   Socioeconomic History  . Marital status: Single    Spouse name: Not on file  . Number of children: Not on file  . Years of education: Not on file  . Highest education level: Not on file  Social Needs  . Financial resource strain: Not on file  . Food insecurity - worry: Not on file  . Food insecurity - inability: Not on file  . Transportation needs - medical: Not on file  . Transportation needs - non-medical: Not on file  Occupational History  . Not on file  Tobacco Use  . Smoking status: Former Smoker    Packs/day: 1.00    Years: 40.00    Pack years: 40.00    Types: Cigarettes    Start date: 06/23/1972     Last attempt to quit: 03/26/2012    Years since quitting: 4.9  . Smokeless tobacco: Never Used  Substance and Sexual Activity  . Alcohol use: No  . Drug use: No  . Sexual activity: Yes  Other Topics Concern  . Not on file  Social History Narrative  . Not on file     BP 130/80   Pulse 64   Ht _0  (1.702 m)   Wt 235 lb (106.6 kg)   BMI 36.81 kg/m  Physical Exam:  Well appearing NAD HEENT: Unremarkable Neck:  No JVD, no thyromegally Lymphatics:  No adenopathy Back:  No CVA tenderness Lungs:  Clear HEART:  Regular rate rhythm, no murmurs, no rubs, no clicks Abd:  soft, positive bowel sounds, no organomegally, no rebound, no guarding Ext:  2 plus pulses, no edema, no cyanosis, no clubbing Skin:  No rashes no nodules Neuro:  CN II through XII intact, motor grossly intact  EKG - none  DEVICE  Normal device function.  See PaceArt for details.   Assess/Plan: 1. Chronic systolic heart failure - she appears to be doing well and her EF has improved after BiV insertion. She will continue her current meds.  2. HTN - her blood pressure is well controlled.  3. ICD - her medtronic BiV ICD is working normally.   Mikle Bosworth.D.

## 2017-03-03 NOTE — Patient Instructions (Signed)
Medication Instructions:  Your physician recommends that you continue on your current medications as directed. Please refer to the Current Medication list given to you today.  Labwork: None ordered.  Testing/Procedures: None ordered.  Follow-Up: Your physician wants you to follow-up in: one year with [name].   You will receive a reminder letter in the mail two months in advance. If you don't receive a letter, please call our office to schedule the follow-up appointment.  Remote monitoring is used to monitor your ICD from home. This monitoring reduces the number of office visits required to check your device to one time per year. It allows [place] to keep an eye on the functioning of your device to ensure it is working properly. You are scheduled for a device check from home on 06/02/2017. You may send your transmission at any time that day. If you have a wireless device, the transmission will be sent automatically. After your physician reviews your transmission, you will receive a postcard with your next transmission date.    Any Other Special Instructions Will Be Listed Below (If Applicable).     If you need a refill on your cardiac medications before your next appointment, please call your pharmacy.

## 2017-03-06 ENCOUNTER — Other Ambulatory Visit (HOSPITAL_COMMUNITY): Payer: Self-pay

## 2017-03-06 ENCOUNTER — Encounter (HOSPITAL_COMMUNITY): Payer: Self-pay

## 2017-03-06 ENCOUNTER — Other Ambulatory Visit: Payer: Self-pay

## 2017-03-06 NOTE — Progress Notes (Signed)
Paramedicine Encounter    Patient ID: [name], female    DOB: 1950-08-23, 67 y.o.   MRN: [id number]    Patient Care Team: [name], MD as PCP - General (Family Medicine)  Patient Active Problem List   Diagnosis Date Noted  . Atrial fibrillation ([place]) 02/11/2017  . Paroxysmal A-fib ([place])   . Biventricular automatic implantable cardioverter defibrillator in situ   . Sepsis ([place]) 04/20/2016  . UTI (urinary tract infection) 04/20/2016  . Dyspnea 07/19/2015  . Interstitial lung disease ([employer]) 11/20/2014  . SIRS (systemic inflammatory response syndrome) ([place]) 02/03/2014  . IDDM (insulin dependent diabetes mellitus) ([place]) 02/03/2014  . Tachycardia 06/14/2013  . Chronic systolic CHF (congestive heart failure) ([place]) 09/29/2012  . Hypoxia 03/06/2012  . Hypertensive heart disease 03/06/2012  . Nonischemic cardiomyopathy ([place]) 03/06/2012  . Tobacco abuse 03/06/2012  . Type 2 diabetes mellitus ([place]) 03/06/2012  . Hypertension 03/06/2012  . CAD (coronary artery disease), native coronary artery 03/06/2012  . Hypokalemia 03/06/2012  . Acute bronchitis 02/01/2009  . Sleep apnea 02/01/2009  . Chest pain 02/01/2009    Current Outpatient Medications:  .  amoxicillin (AMOXIL) 500 MG capsule, Take 1 capsule (500 mg total) by mouth 3 (three) times daily., Disp: 21 capsule, Rfl: 0 .  apixaban (ELIQUIS) 5 MG TABS tablet, Take 1 tablet (5 mg total) by mouth 2 (two) times daily., Disp: 60 tablet, Rfl: 3 .  atorvastatin (LIPITOR) 40 MG tablet, Take 1 tablet (40 mg total) by mouth daily., Disp: 30 tablet, Rfl: 3 .  carvedilol (COREG) 12.5 MG tablet, Take 2 tablets (25 mg total) by mouth 2 (two) times daily with a meal., Disp: 270 tablet, Rfl: 3 .  furosemide (LASIX) 40 MG tablet, Take 1 tablet (40 mg total) by mouth daily., Disp: 90 tablet, Rfl: 3 .  isosorbide-hydrALAZINE (BIDIL) 20-37.5 MG tablet, Take 2 tablets by mouth 3 (three) times daily., Disp: 180 tablet, Rfl: 6 .  metFORMIN  (GLUCOPHAGE) 1000 MG tablet, TAKE 1 TABLET(1000 MG) BY MOUTH TWICE DAILY WITH A MEAL, Disp: 60 tablet, Rfl: 0 .  potassium chloride 20 MEQ TBCR, Take 40 mEq by mouth daily., Disp: 60 tablet, Rfl: 3 .  sacubitril-valsartan (ENTRESTO) 97-103 MG, Take 1 tablet by mouth 2 (two) times daily., Disp: 60 tablet, Rfl: 6 .  spironolactone (ALDACTONE) 25 MG tablet, Take 1 tablet (25 mg total) by mouth daily., Disp: 30 tablet, Rfl: 3 .  acetaminophen (TYLENOL) 500 MG tablet, Take 1 tablet (500 mg total) by mouth every 6 (six) hours as needed., Disp: 30 tablet, Rfl: 0 .  albuterol (PROVENTIL HFA;VENTOLIN HFA) 108 (90 Base) MCG/ACT inhaler, Inhale 2 puffs into the lungs every 6 (six) hours as needed for wheezing or shortness of breath., Disp: 18 g, Rfl: 0 .  benzonatate (TESSALON) 100 MG capsule, Take 1 capsule (100 mg total) by mouth every 8 (eight) hours., Disp: 21 capsule, Rfl: 0 .  doxycycline (VIBRAMYCIN) 100 MG capsule, Take 1 capsule (100 mg total) by mouth 2 (two) times daily., Disp: 14 capsule, Rfl: 0 .  glucose blood (FREESTYLE TEST STRIPS) test strip, Use as instructed, Disp: 100 each, Rfl: 0 .  glucose monitoring kit (FREESTYLE) monitoring kit, 1 each by Does not apply route 4 (four) times daily - after meals and at bedtime. 1 month Diabetic Testing Supplies for QAC-QHS accuchecks., Disp: 1 each, Rfl: 1 .  insulin aspart (NOVOLOG FLEXPEN) 100 UNIT/ML FlexPen, 0-20 Units, Subcutaneous, 3 times daily with meals CBG < 70: implement hypoglycemia  protocol CBG 70 - 120: 0 units CBG 121 - 150: 3 units CBG 151 - 200: 4 units CBG 201 - 250: 7 units CBG 251 - 300: 11 units CBG 301 - 350: 15 units CBG 351 - 400: 20 units CBG > 400: call MD, Disp: 15 mL, Rfl: 0 .  Insulin Glargine (LANTUS SOLOSTAR) 100 UNIT/ML Solostar Pen, Inject 50 Units into the skin daily at 10 pm., Disp: , Rfl:  .  Insulin Pen Needle 32G X 8 MM MISC, Use as directed, Disp: 100 each, Rfl: 0 .  Lancets (FREESTYLE) lancets, Use as instructed, Disp:  100 each, Rfl: 0 .  naproxen sodium (ALEVE) 220 MG tablet, Take 220 mg daily as needed by mouth (pain)., Disp: , Rfl:  .  Oxycodone HCl 10 MG TABS, Take 10 mg by mouth 3 (three) times daily., Disp: , Rfl: 0 Allergies  Allergen Reactions  . Morphine And Related Nausea And Vomiting and Other (See Comments)    Severe nausea     Social History   Socioeconomic History  . Marital status: Single    Spouse name: Not on file  . Number of children: Not on file  . Years of education: Not on file  . Highest education level: Not on file  Social Needs  . Financial resource strain: Not on file  . Food insecurity - worry: Not on file  . Food insecurity - inability: Not on file  . Transportation needs - medical: Not on file  . Transportation needs - non-medical: Not on file  Occupational History  . Not on file  Tobacco Use  . Smoking status: Former Smoker    Packs/day: 1.00    Years: 40.00    Pack years: 40.00    Types: Cigarettes    Start date: 06/23/1972    Last attempt to quit: 03/26/2012    Years since quitting: 4.9  . Smokeless tobacco: Never Used  Substance and Sexual Activity  . Alcohol use: No  . Drug use: No  . Sexual activity: Yes  Other Topics Concern  . Not on file  Social History Narrative  . Not on file    Physical Exam  Pulmonary/Chest: No respiratory distress. She has no wheezes.  Abdominal: She exhibits no distension. There is no tenderness.  Musculoskeletal: She exhibits no edema.  Skin: Skin is warm and dry. She is not diaphoretic.        Future Appointments  Date Time Provider [place]  03/17/2017  9:00 AM [facility] PA/NP [facility] None    ATF pt CAO x4 sitting outside on the porch talking on the phone.  She stated that she was diagnosed with pneumonia a couple of days ago.  Pt stated that she had a bad cough so she went to the ED.  Shes currently taking the antibiotics 3 times daily as prescribed and has been taking all of her meds without missing a  dose.  She has no issues today.  Pt denies sob, chest pain and dizziness.  She ate a steak biscuit from [employer] this am for breakfast.  She hasn't taken any insulin today but she did take her morning meds after eating breakfast.  rx bottles verified and pill box refilled.    Pt has a constant issue with keeping up with the low sodium diet.  She sometimes become agitated when we discuss her food choices.  We will continue working on different ideas for her diet.   BP 130/90 (BP Location: Left Arm, Patient  Position: Sitting, Cuff Size: Normal)   Pulse (!) 106   Resp 16   Wt 220 lb (99.8 kg)   SpO2 96%   BMI 34.46 kg/m   Pt tries to weigh in the same place but its difficult due to the way the floors are in her house.  CBG 420 (pt took 20 units of insulin after I checked her vitals)  Weight yesterday-didn't weigh Last visit weight-227  **rx called in: Metformin filled until second box mon/ none tue-thurs potassium  Demetria Copper, EMT Paramedic 03/07/2017    ACTION: Home visit completed Next visit planned for in two weeks

## 2017-03-15 ENCOUNTER — Encounter (HOSPITAL_COMMUNITY): Payer: Self-pay | Admitting: Oncology

## 2017-03-15 ENCOUNTER — Emergency Department (HOSPITAL_COMMUNITY)
Admission: EM | Admit: 2017-03-15 | Discharge: 2017-03-16 | Disposition: A | Discharge status: Home / Self Care | Payer: Medicare Other | Attending: Emergency Medicine | Admitting: Emergency Medicine

## 2017-03-15 ENCOUNTER — Emergency Department (HOSPITAL_COMMUNITY): Payer: Medicare Other

## 2017-03-15 DIAGNOSIS — I5040 Unspecified combined systolic (congestive) and diastolic (congestive) heart failure: Secondary | ICD-10-CM

## 2017-03-15 DIAGNOSIS — I110 Hypertensive heart disease with heart failure: Secondary | ICD-10-CM | POA: Insufficient documentation

## 2017-03-15 DIAGNOSIS — I2510 Atherosclerotic heart disease of native coronary artery without angina pectoris: Secondary | ICD-10-CM | POA: Diagnosis not present

## 2017-03-15 DIAGNOSIS — Z7901 Long term (current) use of anticoagulants: Secondary | ICD-10-CM | POA: Insufficient documentation

## 2017-03-15 DIAGNOSIS — Z87891 Personal history of nicotine dependence: Secondary | ICD-10-CM | POA: Diagnosis not present

## 2017-03-15 DIAGNOSIS — Z79899 Other long term (current) drug therapy: Secondary | ICD-10-CM | POA: Insufficient documentation

## 2017-03-15 DIAGNOSIS — R0789 Other chest pain: Secondary | ICD-10-CM | POA: Diagnosis present

## 2017-03-15 DIAGNOSIS — I5042 Chronic combined systolic (congestive) and diastolic (congestive) heart failure: Secondary | ICD-10-CM | POA: Diagnosis not present

## 2017-03-15 DIAGNOSIS — E119 Type 2 diabetes mellitus without complications: Secondary | ICD-10-CM | POA: Insufficient documentation

## 2017-03-15 DIAGNOSIS — R079 Chest pain, unspecified: Secondary | ICD-10-CM

## 2017-03-15 DIAGNOSIS — R0602 Shortness of breath: Secondary | ICD-10-CM

## 2017-03-15 DIAGNOSIS — R0609 Other forms of dyspnea: Secondary | ICD-10-CM | POA: Insufficient documentation

## 2017-03-15 LAB — BASIC METABOLIC PANEL WITH GFR
Anion gap: 10 (ref 5–15)
BUN: 14 mg/dL (ref 6–20)
CO2: 23 mmol/L (ref 22–32)
Calcium: 8.6 mg/dL — ABNORMAL LOW (ref 8.9–10.3)
Chloride: 100 mmol/L — ABNORMAL LOW (ref 101–111)
Creatinine, Ser: 1.13 mg/dL — ABNORMAL HIGH (ref 0.44–1.00)
GFR calc Af Amer: 58 mL/min — ABNORMAL LOW
GFR calc non Af Amer: 50 mL/min — ABNORMAL LOW
Glucose, Bld: 302 mg/dL — ABNORMAL HIGH (ref 65–99)
Potassium: 3.5 mmol/L (ref 3.5–5.1)
Sodium: 133 mmol/L — ABNORMAL LOW (ref 135–145)

## 2017-03-15 LAB — CBC
HCT: 33.7 % — ABNORMAL LOW (ref 36.0–46.0)
Hemoglobin: 11.6 g/dL — ABNORMAL LOW (ref 12.0–15.0)
MCH: 30.8 pg (ref 26.0–34.0)
MCHC: 34.4 g/dL (ref 30.0–36.0)
MCV: 89.4 fL (ref 78.0–100.0)
Platelets: 394 10*3/uL (ref 150–400)
RBC: 3.77 MIL/uL — ABNORMAL LOW (ref 3.87–5.11)
RDW: 12.7 % (ref 11.5–15.5)
WBC: 9.6 10*3/uL (ref 4.0–10.5)

## 2017-03-15 LAB — BRAIN NATRIURETIC PEPTIDE: B Natriuretic Peptide: 241.3 pg/mL — ABNORMAL HIGH (ref 0.0–100.0)

## 2017-03-15 LAB — I-STAT TROPONIN, ED: Troponin i, poc: 0 ng/mL (ref 0.00–0.08)

## 2017-03-15 NOTE — ED Notes (Signed)
Patient transported to X-ray 

## 2017-03-15 NOTE — ED Triage Notes (Signed)
Pt bib [facility] from home d/t c/o central CP w/o radiation. Per EMS pt stated pain started at approximately 1600.  Pt took 324 mg asa at home.  Pt refused nitro en route.  Pt does have pacemaker however is unsure what type.  Per EMS, pt had runs of a. Fib en route.  Denies nausea.  Endorses shob, weakness, dizziness and diaphoresis.    Pt now stating sx started on Wednesday.

## 2017-03-15 NOTE — ED Notes (Signed)
Family at bedside. 

## 2017-03-15 NOTE — ED Notes (Signed)
Pt given milk and graham crackers and peanut butter

## 2017-03-15 NOTE — ED Notes (Signed)
Pt ambulated in Hallway without O2 while on Pulse Ox. Pt started at 98%. Pt stayed between 98%-100% while walking. Pt stated she did not want to go far b/c she felt like she was getting short of breath. Pt back in bed and at 96%.

## 2017-03-15 NOTE — ED Provider Notes (Signed)
[facility] EMERGENCY DEPARTMENT Provider Note   CSN: [id number] Arrival date & time: 03/15/17  1943     History   Chief Complaint Chief Complaint  Patient presents with  . Chest Pain    HPI [name] is a 67 y.o. female.  HPI  Pt with hx of CAD - non obstructive, CHF, P Afib comes in with cc of chest pain. Chest pain is central and started 3 days ago and is intermittent, but today I thas been worse. Pain is sharp, non radiating. Pt has no hx of similar pain in the past. The chest pain got more intense today, thus she decided to come to the ER. Pt has associated dib, diaphoresis but denies any nausea. Pt's pain and dib are worse with exertion, and she has shortness of breath with activity that normally she is able to perform without significant difficulties.  Pt has no hx of PE, DVT and denies any exogenous hormone (testosterone / estrogen) use, long distance travels or surgery in the past 6 weeks, active cancer, recent immobilization.  Past Medical History:  Diagnosis Date  . Anemia    a. Noted on 07/2012 labs, instructed to f/u PCP.  Marland Kitchen Arthritis    "joints" (11/18/2012)  . CAD (coronary artery disease), native coronary artery    a. Nonobstructive by cath 02/2012 (done because of low EF).  . Chronic bronchitis ([place])    "~ every other year" (11/18/2012)  . Chronic combined systolic and diastolic CHF (congestive heart failure) ([place])    a. 03/05/12 echo:  LVEF 20-25%, moderate LVH , inferior and basal to mid septal akinesis, anterior moderate to severe hypokinesis and grade 2 diastolic dysfunction. b. EF 07/2012: EF still 25% (unclear medication compliance).  . Chronic lower back pain   . Headache(784.0)    "often; maybe not daily" (11/18/2012)  . High cholesterol   . History of noncompliance with medical treatment   . Hypertension   . LBBB (left bundle branch block)   . Orthopnea   . Tobacco abuse   . Type II diabetes mellitus [facility])     Patient Active  Problem List   Diagnosis Date Noted  . Atrial fibrillation ([place]) 02/11/2017  . Paroxysmal A-fib ([place])   . Biventricular automatic implantable cardioverter defibrillator in situ   . Sepsis ([place]) 04/20/2016  . UTI (urinary tract infection) 04/20/2016  . Dyspnea 07/19/2015  . Interstitial lung disease ([place]) 11/20/2014  . SIRS (systemic inflammatory response syndrome) ([place]) 02/03/2014  . IDDM (insulin dependent diabetes mellitus) ([place]) 02/03/2014  . Tachycardia 06/14/2013  . Chronic systolic CHF (congestive heart failure) ([place]) 09/29/2012  . Hypoxia 03/06/2012  . Hypertensive heart disease 03/06/2012  . Nonischemic cardiomyopathy ([place]) 03/06/2012  . Tobacco abuse 03/06/2012  . Type 2 diabetes mellitus ([place]) 03/06/2012  . Hypertension 03/06/2012  . CAD (coronary artery disease), native coronary artery 03/06/2012  . Hypokalemia 03/06/2012  . Acute bronchitis 02/01/2009  . Sleep apnea 02/01/2009  . Chest pain 02/01/2009    Past Surgical History:  Procedure Laterality Date  . BI-VENTRICULAR IMPLANTABLE CARDIOVERTER DEFIBRILLATOR N/A 11/18/2012   Procedure: BI-VENTRICULAR IMPLANTABLE CARDIOVERTER DEFIBRILLATOR  (CRT-D);  Surgeon: Evans Lance, MD;  Location[facility] C[facility] LAB;  Service: Cardiovascular;  Laterality: N/A;  . BI-VENTRICULAR IMPLANTABLE CARDIOVERTER DEFIBRILLATOR  (CRT-D)  11/18/2012  . CARDIAC CATHETERIZATION  03/04/12   nonobstructive CAD, elevated LVEDP and tortuous vessels suggestive of long-standing hypertension  . COLONOSCOPY WITH PROPOFOL N/A 12/12/2016   Procedure: COLONOSCOPY WITH PROPOFOL;  Surgeon: Carol Ada, MD;  Location: [facility];  Service: Endoscopy;  Laterality: N/A;  . JOINT REPLACEMENT     Bilateral hip and right knee  . LEFT HEART CATH N/A 03/05/2012   Procedure: LEFT HEART CATH;  Surgeon: Larey Dresser, MD;  Location[facility] CATH LAB;  Service: Cardiovascular;  Laterality: N/A;    OB History    No data available       Home Medications    Prior to  Admission medications   Medication Sig Start Date End Date Taking? Authorizing Provider  acetaminophen (TYLENOL) 500 MG tablet Take 1 tablet (500 mg total) by mouth every 6 (six) hours as needed. Patient taking differently: Take 500 mg by mouth every 6 (six) hours as needed for mild pain.  09/15/15  Yes Leo Grosser, MD  albuterol (PROVENTIL HFA;VENTOLIN HFA) 108 (90 Base) MCG/ACT inhaler Inhale 2 puffs into the lungs every 6 (six) hours as needed for wheezing or shortness of breath. 04/25/16  Yes Ghimire, Henreitta Leber, MD  apixaban (ELIQUIS) 5 MG TABS tablet Take 1 tablet (5 mg total) by mouth 2 (two) times daily. 05/22/16  Yes Clegg, Amy D, NP  atorvastatin (LIPITOR) 40 MG tablet Take 1 tablet (40 mg total) by mouth daily. 05/22/16  Yes Clegg, Amy D, NP  benzonatate (TESSALON) 100 MG capsule Take 1 capsule (100 mg total) by mouth every 8 (eight) hours. Patient taking differently: Take 100 mg by mouth 3 (three) times daily as needed for cough.  03/01/17  Yes Dorie Rank, MD  carvedilol (COREG) 12.5 MG tablet Take 2 tablets (25 mg total) by mouth 2 (two) times daily with a meal. 02/12/17  Yes Seiler, [employer] K, NP  furosemide (LASIX) 40 MG tablet Take 1 tablet (40 mg total) by mouth daily. 02/17/17  Yes Galadriel Friar, PA-C  glucose blood (FREESTYLE TEST STRIPS) test strip Use as instructed 04/25/16  Yes Ghimire, Henreitta Leber, MD  glucose monitoring kit (FREESTYLE) monitoring kit 1 each by Does not apply route 4 (four) times daily - after meals and at bedtime. 1 month Diabetic Testing Supplies for QAC-QHS accuchecks. 04/25/16  Yes Ghimire, Henreitta Leber, MD  insulin aspart (NOVOLOG FLEXPEN) 100 UNIT/ML FlexPen 0-20 Units, Subcutaneous, 3 times daily with meals CBG < 70: implement hypoglycemia protocol CBG 70 - 120: 0 units CBG 121 - 150: 3 units CBG 151 - 200: 4 units CBG 201 - 250: 7 units CBG 251 - 300: 11 units CBG 301 - 350: 15 units CBG 351 - 400: 20 units CBG > 400: call MD 04/25/16  Yes Ghimire,  Henreitta Leber, MD  Insulin Glargine (LANTUS SOLOSTAR) 100 UNIT/ML Solostar Pen Inject 50 Units into the skin daily at 10 pm.   Yes [provider]  Insulin Pen Needle 32G X 8 MM MISC Use as directed 06/18/16  Yes Jettie Booze E, NP  isosorbide-hydrALAZINE (BIDIL) 20-37.5 MG tablet Take 2 tablets by mouth 3 (three) times daily. 09/11/16  Yes Arbutus Leas, NP  Lancets (FREESTYLE) lancets Use as instructed 06/18/16  Yes Jettie Booze E, NP  metFORMIN (GLUCOPHAGE) 1000 MG tablet TAKE 1 TABLET(1000 MG) BY MOUTH TWICE DAILY WITH A MEAL 05/30/16  Yes Orchid Friar, PA-C  naproxen sodium (ALEVE) 220 MG tablet Take 220 mg daily as needed by mouth (pain).   Yes [provider]  Oxycodone HCl 10 MG TABS Take 10 mg by mouth 3 (three) times daily. 12/09/16  Yes [provider]  potassium chloride 20 MEQ TBCR  Take 40 mEq by mouth daily. 10/07/16  Yes Arbutus Leas, NP  sacubitril-valsartan (ENTRESTO) 97-103 MG Take 1 tablet by mouth 2 (two) times daily. 06/04/16  Yes Clegg, Amy D, NP  spironolactone (ALDACTONE) 25 MG tablet Take 1 tablet (25 mg total) by mouth daily. 05/22/16  Yes Clegg, Amy D, NP    Family History Family History  Problem Relation Age of Onset  . Heart disease Neg Hx     Social History Social History   Tobacco Use  . Smoking status: Former Smoker    Packs/day: 1.00    Years: 40.00    Pack years: 40.00    Types: Cigarettes    Start date: 06/23/1972    Last attempt to quit: 03/26/2012    Years since quitting: 4.9  . Smokeless tobacco: Never Used  Substance Use Topics  . Alcohol use: No  . Drug use: No     Allergies   Morphine and related   Review of Systems Review of Systems  Constitutional: Positive for activity change.  Respiratory: Positive for shortness of breath.   Cardiovascular: Positive for chest pain.  Gastrointestinal: Negative for blood in stool.  Hematological: Bruises/bleeds easily.  All other systems reviewed and are  negative.    Physical Exam Updated Vital Signs BP 138/66   Pulse 92   Temp 98.8 F (37.1 C) (Oral)   Resp 19   Ht _0  (1.702 m)   Wt 99.8 kg (220 lb)   SpO2 100%   BMI 34.46 kg/m   Physical Exam  Constitutional: She is oriented to person, place, and time. She appears well-developed.  HENT:  Head: Normocephalic and atraumatic.  Eyes: EOM are normal.  Neck: Normal range of motion. Neck supple.  Cardiovascular: Normal rate.  Pulmonary/Chest: Effort normal. She has no decreased breath sounds. She has no wheezes. She has no rhonchi. She has no rales.  Abdominal: Bowel sounds are normal.  Musculoskeletal:       Right lower leg: She exhibits no tenderness and no edema.       Left lower leg: She exhibits no tenderness and no edema.  Neurological: She is alert and oriented to person, place, and time.  Skin: Skin is warm and dry.  Nursing note and vitals reviewed.    ED Treatments / Results  Labs (all labs ordered are listed, but only abnormal results are displayed) Labs Reviewed  BASIC METABOLIC PANEL - Abnormal; Notable for the following components:      Result Value   Sodium 133 (*)    Chloride 100 (*)    Glucose, Bld 302 (*)    Creatinine, Ser 1.13 (*)    Calcium 8.6 (*)    GFR calc non Af Amer 50 (*)    GFR calc Af Amer 58 (*)    All other components within normal limits  CBC - Abnormal; Notable for the following components:   RBC 3.77 (*)    Hemoglobin 11.6 (*)    HCT 33.7 (*)    All other components within normal limits  BRAIN NATRIURETIC PEPTIDE - Abnormal; Notable for the following components:   B Natriuretic Peptide 241.3 (*)    All other components within normal limits  I-STAT TROPONIN, ED  I-STAT TROPONIN, ED    EKG  EKG Interpretation  Date/Time:  Sunday March 15 2017 20:09:12 EST Ventricular Rate:  109 PR Interval:    QRS Duration: 128 QT Interval:  430 QTC Calculation: 544 R Axis:   -100 Text  Interpretation:  Atrial fibrillation  Ventricular tachycardia, unsustained Right bundle branch block Inferior infarct, acute Anterolateral infarct, old Nonspecific ST and T wave abnormality No acute changes Confirmed by Varney Biles [phone]) on 03/16/2017 12:06:45 AM       Radiology Dg Chest 2 View  Result Date: 03/15/2017 CLINICAL DATA:  Central chest pain for several days. EXAM: CHEST  2 VIEW COMPARISON:  03/01/2017 FINDINGS: Multi lead left-sided pacemaker with intact leads. Unchanged heart size and mediastinal contours with borderline cardiomegaly. Improved right infrahilar aeration with resolved prominent infrahilar markings. No new airspace disease. No pleural effusion, pulmonary edema or pneumothorax. Degenerative change in the spine. IMPRESSION: Resolved right infrahilar opacity.  No acute abnormality. Electronically Signed   By: Jeb Levering M.D.   On: 03/15/2017 21:23    Procedures Procedures (including critical care time)  Medications Ordered in ED Medications  aspirin chewable tablet 324 mg (not administered)     Initial Impression / Assessment and Plan / ED Course  I have reviewed the triage vital signs and the nursing notes.  Pertinent labs & imaging results that were available during my care of the patient were reviewed by me and considered in my medical decision making (see chart for details).  Clinical Course as of Mar 16 10  Nancy Fetter Mar 15, 2017  2300 Patient ambulated.  She had no hypoxia, no tachycardia, no tachypnea.  However patient started having shortness of breath and asked to stop walking.  On my reassessment, patient states that she did not have worsening of her chest pain while walking.  And she felt very comfortable.  She would prefer going home.  [AN]  Mon Mar 16, 2017  0003 2014 CATH:  Left mainstem: Short, no significant disease.  Left anterior descending (LAD): Mild luminal irregularities.   Left circumflex (LCx): Mild luminal irregularities.   Right coronary artery (RCA):  30-40% mid RCA stenosis.   [AN]  0010 Patient is now chest pain-free.  Second troponin was just drawn.  I spoke with cardiology on-call, and they are comfortable with patient going home with close follow-up.  Cardiology stated that she will send a message to cardiology team for a prompt follow-up.  She recommends however that patient also call heart failure clinic for a prompt follow-up.  Patient informed about the cardiology recommendation, and she is extremely happy about that.  She has continued to be chest pain-free. Strict ER return precautions have been discussed, and patient is agreeing with the plan and is comfortable with the workup done and the recommendations from the ER.  Patient will be discharged if the second troponin is negative.  [AN]    Clinical Course User Index [AN] Varney Biles, MD    67 year old female comes in with chief complaint of shortness of breath.  Patient has history of CHF, nonobstructive, with a EF of 45% per echo from June 2018.  Patient also has history of A. fib and is on blood thinners.  Patient appears comfortable, on exam there is no evidence of volume overload.  Chest x-ray does not show any pleural effusions or pulmonary edema.  There is no pitting edema in the lower extremities either.  Patient's weight is 220 pounds, which is close to her dry weight.  BNP is slightly elevated, I do think there is a component of diastolic heart failure with the patient's symptoms right now.  We will ambulate the patient and see how she does.   In addition to CHF, we considered ACS in the differential  diagnosis.  Patient had a cath 5 years ago, which was nonobstructive.  The exertional dyspnea and the chest pain could be anginal in nature.  EKG shows left bundle which is known.  Delta troponins ordered.   Final Clinical Impressions(s) / ED Diagnoses   Final diagnoses:  Combined systolic and diastolic congestive heart failure, unspecified HF chronicity (HCC)  Chest  pain, unspecified type  Shortness of breath on exertion    ED Discharge Orders    None       Varney Biles, MD 03/16/17 [phone]

## 2017-03-15 NOTE — ED Notes (Signed)
Called main lab to add on bnp

## 2017-03-16 DIAGNOSIS — I5042 Chronic combined systolic (congestive) and diastolic (congestive) heart failure: Secondary | ICD-10-CM | POA: Diagnosis not present

## 2017-03-16 LAB — I-STAT TROPONIN, ED: Troponin i, poc: 0.01 ng/mL (ref 0.00–0.08)

## 2017-03-16 MED ORDER — ASPIRIN 81 MG PO CHEW: 324.0000 mg | CHEWABLE_TABLET | Freq: Once | ORAL | Status: DC

## 2017-03-16 NOTE — Discharge Instructions (Signed)

## 2017-03-17 ENCOUNTER — Encounter (HOSPITAL_COMMUNITY): Payer: Self-pay

## 2017-03-17 ENCOUNTER — Ambulatory Visit (HOSPITAL_COMMUNITY)
Admission: RE | Admit: 2017-03-17 | Discharge: 2017-03-17 | Disposition: A | Discharge status: Home / Self Care | Payer: Medicare Other | Source: Ambulatory Visit | Attending: Cardiology | Admitting: Cardiology

## 2017-03-17 ENCOUNTER — Encounter: Payer: Self-pay | Admitting: Cardiology

## 2017-03-17 VITALS — BP 132/62 | HR 98 | Wt 236.0 lb

## 2017-03-17 DIAGNOSIS — E785 Hyperlipidemia, unspecified: Secondary | ICD-10-CM | POA: Diagnosis not present

## 2017-03-17 DIAGNOSIS — Z794 Long term (current) use of insulin: Secondary | ICD-10-CM | POA: Diagnosis not present

## 2017-03-17 DIAGNOSIS — E119 Type 2 diabetes mellitus without complications: Secondary | ICD-10-CM | POA: Insufficient documentation

## 2017-03-17 DIAGNOSIS — I2510 Atherosclerotic heart disease of native coronary artery without angina pectoris: Secondary | ICD-10-CM | POA: Insufficient documentation

## 2017-03-17 DIAGNOSIS — Z95810 Presence of automatic (implantable) cardiac defibrillator: Secondary | ICD-10-CM | POA: Insufficient documentation

## 2017-03-17 DIAGNOSIS — I10 Essential (primary) hypertension: Secondary | ICD-10-CM | POA: Diagnosis not present

## 2017-03-17 DIAGNOSIS — I429 Cardiomyopathy, unspecified: Secondary | ICD-10-CM | POA: Diagnosis not present

## 2017-03-17 DIAGNOSIS — I110 Hypertensive heart disease with heart failure: Secondary | ICD-10-CM | POA: Insufficient documentation

## 2017-03-17 DIAGNOSIS — I480 Paroxysmal atrial fibrillation: Secondary | ICD-10-CM | POA: Insufficient documentation

## 2017-03-17 DIAGNOSIS — Z79899 Other long term (current) drug therapy: Secondary | ICD-10-CM | POA: Diagnosis not present

## 2017-03-17 DIAGNOSIS — I5022 Chronic systolic (congestive) heart failure: Secondary | ICD-10-CM

## 2017-03-17 DIAGNOSIS — Z87891 Personal history of nicotine dependence: Secondary | ICD-10-CM | POA: Insufficient documentation

## 2017-03-17 DIAGNOSIS — Z7901 Long term (current) use of anticoagulants: Secondary | ICD-10-CM | POA: Insufficient documentation

## 2017-03-17 MED ORDER — CARVEDILOL 25 MG PO TABS: 25.0000 mg | ORAL_TABLET | Freq: Two times a day (BID) | ORAL | 11 refills | Status: DC

## 2017-03-17 NOTE — Patient Instructions (Signed)
Take Lasix 40 mg (1 tab) TWICE daily for TWO DAYS. Then reduce back to normal daily dose of 40 mg once daily.  Take Potassium 20 meq (1 tab) TWICE daily for TWO DAYS. Then reduce back to normal daily dose of 20 meq once daily.  New Rx has been sent in for 25 mg Carvedilol (Coreg) tabs (Take 1 tab twice daily).  Follow up 4 weeks with with Oda Kilts PA-C.  ___________________________________________________________ [name] Code:  Take all medication as prescribed the day of your appointment. Bring all medications with you to your appointment.  Do the following things EVERYDAY: 1) Weigh yourself in the morning before breakfast. Write it down and keep it in a log. 2) Take your medicines as prescribed 3) Eat low salt foods-Limit salt (sodium) to 2000 mg per day.  4) Stay as active as you can everyday 5) Limit all fluids for the day to less than 2 liters

## 2017-03-17 NOTE — Progress Notes (Signed)
Patient ID: [name], female   DOB: Apr 08, 1950, 67 y.o.   MRN: [id number]   Advanced Heart Failure Clinic Note   PCP: Dr. 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 female with history of nonischemic cardiomyopathy.  She was admitted with CHF exacerbation in 02/2012.  EF 20-25% on echo, LHC with nonobstructive CAD.  She was started on cardiac meds and discharged.  In 07/2012, she was admitted again with CHF exacerbation.  She had run out of Lasix.  She was taking her other heart medications as ordered, however.  She was diuresed and discharged. She had a chronic LBBB, and Medtronic CRT-D device was placed in 10/14.  She was admitted in 6/16 with hypertensive emergency and CHF exacerbation.   Also of note, she had PFTs in 9/16 showing restrictive spirometry with low lung volume and DLCO.  She was supposed to get a high resolution CT to evaluate for interstitial lung disease but never had the study.     Admitted March 2018 with urosepsis--> E Coli Bacteremia. Completed antibiotic course. EF was down from previous to 30-35%. Also had atrial fibrillation so she was loaded on amiodarone. Placed on eliquis. Discharge weight was 208 pounds.  She is now off amiodarone.   Admitted to [facility] 1/16 -> 1/17 with CP and dizziness. Found to be in Afib. Converted spontaneously overnight with BB and diuresis.   She presents today for 4 week follow up. In ED 03/15/17 due to CP and SOB. EKG showed Afib. She recently had the flu.  She is feeling much better today. Remains SOB with mild exertion, and weight up on our scales. Weight in 220s at home.  Denies CP now in NSR, only has with palpitations/Afib. None since ED visit.  No SOB on flat ground. Mildly orthopneic. Denies lightheadedness or dizziness. Taking all medications as directed.   Optivol: Thoracic impedence. Fluid index elevated. Pt had approx 16 hours of Afib 2/16-2/17. Pt activity around 4 hours a part.   Labs (2/14): SPEP negative, UPEP  negative, HIV negative  PMH: 1. HTN 2. Type II diabetes 3. Nonischemic cardiomyopathy: ? Due to HTN versus LBBB CMP.  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  Echo (2/14) with EF 20-25%.  Echo (7/14) with EF 25%, diffuse hypokinesis.  HIV, SPEP, UPEP negative.  Has LBBB. CRT-D 10/2012 (Medtronic).  Echo (4/15) with EF 45-50%, mild diffuse hypokinesis, PA systolic pressure 38 mmHg.  Echo (6/16) with EF 40-45%, mild LVH, septal and inferior hypokinesis.   - Echo (3/18): EF 30-35%. Grade 1 DD - Echo (6/18): EF 45-50%, moderate LVH, normal RV size with mildly decreased systolic function.  4. Chronic LBBB 5. Right TKR 6. Bilateral THR.  7. Hyperlipidemia 8. ?COPD: Has oxygen for use with exertion.  - PFTs (9/16) with FEV1 77%, FVC 76%, ratio 101%, TLC 63%, DLCO 41% => moderate restrictive deficit  9. Atrial fibrillation: Paroxysmal.   SH: Prior smoker, quit 2/14.  Never drank ETOH.  No drugs. Lives with son.   FH: Mother with "heart trouble."   Review of systems complete and found to be negative unless listed in HPI.    Current Outpatient Medications  Medication Sig Dispense Refill  . acetaminophen (TYLENOL) 500 MG tablet Take 1 tablet (500 mg total) by mouth every 6 (six) hours as needed. (Patient taking differently: Take 500 mg by mouth every 6 (six) hours as needed for mild pain. ) 30 tablet 0  . albuterol (PROVENTIL HFA;VENTOLIN HFA) 108 (90  Base) MCG/ACT inhaler Inhale 2 puffs into the lungs every 6 (six) hours as needed for wheezing or shortness of breath. 18 g 0  . apixaban (ELIQUIS) 5 MG TABS tablet Take 1 tablet (5 mg total) by mouth 2 (two) times daily. 60 tablet 3  . atorvastatin (LIPITOR) 40 MG tablet Take 1 tablet (40 mg total) by mouth daily. 30 tablet 3  . benzonatate (TESSALON) 100 MG capsule Take 1 capsule (100 mg total) by mouth every 8 (eight) hours. (Patient taking differently: Take 100 mg by mouth 3 (three) times daily as needed for cough. ) 21 capsule 0  . carvedilol (COREG)  12.5 MG tablet Take 2 tablets (25 mg total) by mouth 2 (two) times daily with a meal. 270 tablet 3  . furosemide (LASIX) 40 MG tablet Take 1 tablet (40 mg total) by mouth daily. 90 tablet 3  . glucose blood (FREESTYLE TEST STRIPS) test strip Use as instructed 100 each 0  . glucose monitoring kit (FREESTYLE) monitoring kit 1 each by Does not apply route 4 (four) times daily - after meals and at bedtime. 1 month Diabetic Testing Supplies for QAC-QHS accuchecks. 1 each 1  . insulin aspart (NOVOLOG FLEXPEN) 100 UNIT/ML FlexPen 0-20 Units, Subcutaneous, 3 times daily with meals CBG < 70: implement hypoglycemia protocol CBG 70 - 120: 0 units CBG 121 - 150: 3 units CBG 151 - 200: 4 units CBG 201 - 250: 7 units CBG 251 - 300: 11 units CBG 301 - 350: 15 units CBG 351 - 400: 20 units CBG > 400: call MD 15 mL 0  . Insulin Glargine (LANTUS SOLOSTAR) 100 UNIT/ML Solostar Pen Inject 50 Units into the skin daily at 10 pm.    . Insulin Pen Needle 32G X 8 MM MISC Use as directed 100 each 0  . isosorbide-hydrALAZINE (BIDIL) 20-37.5 MG tablet Take 2 tablets by mouth 3 (three) times daily. 180 tablet 6  . Lancets (FREESTYLE) lancets Use as instructed 100 each 0  . metFORMIN (GLUCOPHAGE) 1000 MG tablet TAKE 1 TABLET(1000 MG) BY MOUTH TWICE DAILY WITH A MEAL 60 tablet 0  . naproxen sodium (ALEVE) 220 MG tablet Take 220 mg daily as needed by mouth (pain).    . Oxycodone HCl 10 MG TABS Take 10 mg by mouth 3 (three) times daily.  0  . potassium chloride 20 MEQ TBCR Take 40 mEq by mouth daily. 60 tablet 3  . sacubitril-valsartan (ENTRESTO) 97-103 MG Take 1 tablet by mouth 2 (two) times daily. 60 tablet 6  . spironolactone (ALDACTONE) 25 MG tablet Take 1 tablet (25 mg total) by mouth daily. 30 tablet 3   No current facility-administered medications for this encounter.    Vitals:   03/17/17 0838  BP: 132/62  Pulse: 98  SpO2: 96%  Weight: 236 lb (107 kg)    Wt Readings from Last 3 Encounters:  03/17/17 236 lb  (107 kg)  03/15/17 220 lb (99.8 kg)  03/06/17 220 lb (99.8 kg)    Physical exam General: Fatigued appearing. No resp difficulty. HEENT: Normal Neck: Supple. JVP 8-9 cm. Carotids 2+ bilat; no bruits. No thyromegaly or nodule noted. Cor: PMI nondisplaced. RRR, No M/G/R noted Lungs: CTAB, normal effort. Abdomen: Soft, non-tender, non-distended, no HSM. No bruits or masses. +BS  Extremities: No cyanosis, clubbing, or rash. R and LLE no edema.  Neuro: Alert & orientedx3, cranial nerves grossly intact. moves all 4 extremities w/o difficulty. Affect pleasant   Assessment/Plan: 1. Chronic systolic CHF: Nonischemic  cardiomyopathy, HTN. S/P CRT-D (Medtronic). Echo 06/2016 with improved EF 45-50%. - NYHA III symptoms currently with mild volume overload.  - Volume status elevated on exam.  - Take lasix 40 mg BID x 2 days. Previously orthostatic and this was cut back.  May need to take more often. Continue 40 mg daily for now after 2 day increase. - Continue Coreg 25 mg BID.  - Continue Entresto 97/103 mg BID - Continue bidil 2 tabs TID - Continue spiro 25 mg daily.  - Reinforced fluid restriction to < 2 L daily, sodium restriction to less than 2000 mg daily, and the importance of daily weights.    2. Hyperlipidemia:  - Continue statin. No change.   3. HTN:  - Meds as above. No changes with diuresis.   4. PAF: -  - EKG 03/15/17 shows Afib, confirmed by Optivol. EKG today shows Optivol. No further Afib on ICD.  - Continue Eliquis for anticoagulation. She states she has not missed any doses.  - Can consider amio if continues to have recurrent Afib  Meds as above. BMET today. RTC 4 weeks. Sooner with symptoms.    Corda Friar, PA-C  8:59 AM  03/17/2017  Greater than 50% of the 25 minute visit was spent in counseling/coordination of care regarding disease state education, salt/fluid restriction, sliding scale diuretics, and medication compliance.

## 2017-03-27 LAB — CUP PACEART INCLINIC DEVICE CHECK
Battery Remaining Longevity: 38 mo
Battery Voltage: 2.94 V
Brady Statistic AP VP Percent: 0.01 %
Brady Statistic AP VS Percent: 0.01 %
Brady Statistic AS VP Percent: 98.81 %
Brady Statistic AS VS Percent: 1.18 %
Brady Statistic RA Percent Paced: 0.02 %
Brady Statistic RV Percent Paced: 14 %
Date Time Interrogation Session: 20190205175847
HighPow Impedance: 58 Ohm
Implantable Lead Implant Date: 20141023
Implantable Lead Implant Date: 20141023
Implantable Lead Implant Date: 20141023
Implantable Lead Location: 753858
Implantable Lead Location: 753859
Implantable Lead Location: 753860
Implantable Lead Model: 4298
Implantable Lead Model: 5076
Implantable Lead Model: 6935
Implantable Pulse Generator Implant Date: 20141023
Lead Channel Impedance Value: 342 Ohm
Lead Channel Impedance Value: 342 Ohm
Lead Channel Impedance Value: 342 Ohm
Lead Channel Impedance Value: 418 Ohm
Lead Channel Impedance Value: 418 Ohm
Lead Channel Impedance Value: 475 Ohm
Lead Channel Impedance Value: 475 Ohm
Lead Channel Impedance Value: 532 Ohm
Lead Channel Impedance Value: 551 Ohm
Lead Channel Impedance Value: 551 Ohm
Lead Channel Impedance Value: 551 Ohm
Lead Channel Impedance Value: 589 Ohm
Lead Channel Impedance Value: 646 Ohm
Lead Channel Pacing Threshold Amplitude: 0.5 V
Lead Channel Pacing Threshold Amplitude: 0.75 V
Lead Channel Pacing Threshold Amplitude: 1 V
Lead Channel Pacing Threshold Pulse Width: 0.4 ms
Lead Channel Pacing Threshold Pulse Width: 0.4 ms
Lead Channel Pacing Threshold Pulse Width: 0.4 ms
Lead Channel Sensing Intrinsic Amplitude: 10 mV
Lead Channel Sensing Intrinsic Amplitude: 12.375 mV
Lead Channel Sensing Intrinsic Amplitude: 3.125 mV
Lead Channel Sensing Intrinsic Amplitude: 3.25 mV
Lead Channel Setting Pacing Amplitude: 1.75 V
Lead Channel Setting Pacing Amplitude: 2 V
Lead Channel Setting Pacing Amplitude: 2.5 V
Lead Channel Setting Pacing Pulse Width: 0.4 ms
Lead Channel Setting Pacing Pulse Width: 0.4 ms
Lead Channel Setting Sensing Sensitivity: 0.45 mV

## 2017-04-01 ENCOUNTER — Other Ambulatory Visit (HOSPITAL_COMMUNITY): Payer: Self-pay

## 2017-04-01 NOTE — Progress Notes (Signed)
Paramedicine Encounter    Patient ID: [name], female    DOB: 12/07/1950, 67 y.o.   MRN: [id number]    Patient Care Team: [name], MD as PCP - General (Family Medicine)  Patient Active Problem List   Diagnosis Date Noted  . Atrial fibrillation ([place]) 02/11/2017  . Paroxysmal A-fib ([place])   . Biventricular automatic implantable cardioverter defibrillator in situ   . Sepsis ([place]) 04/20/2016  . UTI (urinary tract infection) 04/20/2016  . Dyspnea 07/19/2015  . Interstitial lung disease ([place]) 11/20/2014  . SIRS (systemic inflammatory response syndrome) ([place]) 02/03/2014  . IDDM (insulin dependent diabetes mellitus) ([place]) 02/03/2014  . Tachycardia 06/14/2013  . Chronic systolic CHF (congestive heart failure) ([place]) 09/29/2012  . Hypoxia 03/06/2012  . Hypertensive heart disease 03/06/2012  . Nonischemic cardiomyopathy ([place]) 03/06/2012  . Tobacco abuse 03/06/2012  . Type 2 diabetes mellitus ([place]) 03/06/2012  . Hypertension 03/06/2012  . CAD (coronary artery disease), native coronary artery 03/06/2012  . Hypokalemia 03/06/2012  . Acute bronchitis 02/01/2009  . Sleep apnea 02/01/2009  . Chest pain 02/01/2009    Current Outpatient Medications:  .  acetaminophen (TYLENOL) 500 MG tablet, Take 1 tablet (500 mg total) by mouth every 6 (six) hours as needed. (Patient taking differently: Take 500 mg by mouth every 6 (six) hours as needed for mild pain. ), Disp: 30 tablet, Rfl: 0 .  albuterol (PROVENTIL HFA;VENTOLIN HFA) 108 (90 Base) MCG/ACT inhaler, Inhale 2 puffs into the lungs every 6 (six) hours as needed for wheezing or shortness of breath., Disp: 18 g, Rfl: 0 .  apixaban (ELIQUIS) 5 MG TABS tablet, Take 1 tablet (5 mg total) by mouth 2 (two) times daily., Disp: 60 tablet, Rfl: 3 .  atorvastatin (LIPITOR) 40 MG tablet, Take 1 tablet (40 mg total) by mouth daily., Disp: 30 tablet, Rfl: 3 .  benzonatate (TESSALON) 100 MG capsule, Take 1 capsule (100 mg total) by mouth every 8  (eight) hours. (Patient taking differently: Take 100 mg by mouth 3 (three) times daily as needed for cough. ), Disp: 21 capsule, Rfl: 0 .  carvedilol (COREG) 25 MG tablet, Take 1 tablet (25 mg total) by mouth 2 (two) times daily with a meal., Disp: 60 tablet, Rfl: 11 .  furosemide (LASIX) 40 MG tablet, Take 1 tablet (40 mg total) by mouth daily., Disp: 90 tablet, Rfl: 3 .  glucose blood (FREESTYLE TEST STRIPS) test strip, Use as instructed, Disp: 100 each, Rfl: 0 .  glucose monitoring kit (FREESTYLE) monitoring kit, 1 each by Does not apply route 4 (four) times daily - after meals and at bedtime. 1 month Diabetic Testing Supplies for QAC-QHS accuchecks., Disp: 1 each, Rfl: 1 .  insulin aspart (NOVOLOG FLEXPEN) 100 UNIT/ML FlexPen, 0-20 Units, Subcutaneous, 3 times daily with meals CBG < 70: implement hypoglycemia protocol CBG 70 - 120: 0 units CBG 121 - 150: 3 units CBG 151 - 200: 4 units CBG 201 - 250: 7 units CBG 251 - 300: 11 units CBG 301 - 350: 15 units CBG 351 - 400: 20 units CBG > 400: call MD, Disp: 15 mL, Rfl: 0 .  Insulin Glargine (LANTUS SOLOSTAR) 100 UNIT/ML Solostar Pen, Inject 50 Units into the skin daily at 10 pm., Disp: , Rfl:  .  Insulin Pen Needle 32G X 8 MM MISC, Use as directed, Disp: 100 each, Rfl: 0 .  isosorbide-hydrALAZINE (BIDIL) 20-37.5 MG tablet, Take 2 tablets by mouth 3 (three) times daily., Disp: 180 tablet, Rfl: 6 .  Lancets (FREESTYLE) lancets, Use as instructed, Disp: 100 each, Rfl: 0 .  metFORMIN (GLUCOPHAGE) 1000 MG tablet, TAKE 1 TABLET(1000 MG) BY MOUTH TWICE DAILY WITH A MEAL, Disp: 60 tablet, Rfl: 0 .  naproxen sodium (ALEVE) 220 MG tablet, Take 220 mg daily as needed by mouth (pain)., Disp: , Rfl:  .  Oxycodone HCl 10 MG TABS, Take 10 mg by mouth 3 (three) times daily., Disp: , Rfl: 0 .  potassium chloride 20 MEQ TBCR, Take 40 mEq by mouth daily., Disp: 60 tablet, Rfl: 3 .  sacubitril-valsartan (ENTRESTO) 97-103 MG, Take 1 tablet by mouth 2 (two) times daily.,  Disp: 60 tablet, Rfl: 6 .  spironolactone (ALDACTONE) 25 MG tablet, Take 1 tablet (25 mg total) by mouth daily., Disp: 30 tablet, Rfl: 3 Allergies  Allergen Reactions  . Morphine And Related Nausea And Vomiting and Other (See Comments)    Severe nausea     Social History   Socioeconomic History  . Marital status: Single    Spouse name: Not on file  . Number of children: Not on file  . Years of education: Not on file  . Highest education level: Not on file  Social Needs  . Financial resource strain: Not on file  . Food insecurity - worry: Not on file  . Food insecurity - inability: Not on file  . Transportation needs - medical: Not on file  . Transportation needs - non-medical: Not on file  Occupational History  . Not on file  Tobacco Use  . Smoking status: Former Smoker    Packs/day: 1.00    Years: 40.00    Pack years: 40.00    Types: Cigarettes    Start date: 06/23/1972    Last attempt to quit: 03/26/2012    Years since quitting: 5.0  . Smokeless tobacco: Never Used  Substance and Sexual Activity  . Alcohol use: No  . Drug use: No  . Sexual activity: Yes  Other Topics Concern  . Not on file  Social History Narrative  . Not on file    Physical Exam      Future Appointments  Date Time Provider [place]  04/14/2017  8:30 AM [facility] PA/NP [facility] None  06/02/2017 11:30 AM [facility] DEVICE REMOTES [facility]    ATF pt CAO x4 sitting on the porch getting her hair fixed.  Pt denies sob, dizziness and chest pain.  She stated that she has been eating foods low in sodium, however her family stated that she has been eating foods that "she shouldn't eat".  I had to visit pt on Saturday due to her being out running errands on Friday.  Pt has taken all of her meds in box pill boxes.  rx bottles verified and pill box refilled for the next two weeks.    BP (!) 152/88 (BP Location: Left Arm, Patient Position: Sitting, Cuff Size: Normal)   Pulse 79     [name], EMT Paramedic 04/01/2017    ACTION: Home visit completed Next visit planned for in two weeks

## 2017-04-08 ENCOUNTER — Telehealth (HOSPITAL_COMMUNITY): Payer: Self-pay

## 2017-04-09 NOTE — Telephone Encounter (Signed)
I called to schedule our CHP visit for this week but pt stated that she still has 1 pill box left.  I told pt that she should be almost out.  She would like to meet next week.

## 2017-04-14 ENCOUNTER — Encounter (HOSPITAL_COMMUNITY): Payer: Self-pay

## 2017-04-14 ENCOUNTER — Ambulatory Visit (HOSPITAL_COMMUNITY)
Admission: RE | Admit: 2017-04-14 | Discharge: 2017-04-14 | Disposition: A | Discharge status: Home / Self Care | Payer: Medicare Other | Source: Ambulatory Visit | Attending: Cardiology | Admitting: Cardiology

## 2017-04-14 VITALS — BP 162/88 | HR 81 | Wt 234.0 lb

## 2017-04-14 DIAGNOSIS — I480 Paroxysmal atrial fibrillation: Secondary | ICD-10-CM | POA: Diagnosis not present

## 2017-04-14 DIAGNOSIS — Z79899 Other long term (current) drug therapy: Secondary | ICD-10-CM | POA: Insufficient documentation

## 2017-04-14 DIAGNOSIS — I429 Cardiomyopathy, unspecified: Secondary | ICD-10-CM | POA: Insufficient documentation

## 2017-04-14 DIAGNOSIS — J449 Chronic obstructive pulmonary disease, unspecified: Secondary | ICD-10-CM | POA: Diagnosis not present

## 2017-04-14 DIAGNOSIS — I2510 Atherosclerotic heart disease of native coronary artery without angina pectoris: Secondary | ICD-10-CM | POA: Diagnosis not present

## 2017-04-14 DIAGNOSIS — Z7901 Long term (current) use of anticoagulants: Secondary | ICD-10-CM | POA: Diagnosis not present

## 2017-04-14 DIAGNOSIS — E119 Type 2 diabetes mellitus without complications: Secondary | ICD-10-CM | POA: Diagnosis not present

## 2017-04-14 DIAGNOSIS — I110 Hypertensive heart disease with heart failure: Secondary | ICD-10-CM | POA: Insufficient documentation

## 2017-04-14 DIAGNOSIS — I5022 Chronic systolic (congestive) heart failure: Secondary | ICD-10-CM | POA: Diagnosis present

## 2017-04-14 DIAGNOSIS — I10 Essential (primary) hypertension: Secondary | ICD-10-CM | POA: Diagnosis not present

## 2017-04-14 DIAGNOSIS — Z794 Long term (current) use of insulin: Secondary | ICD-10-CM | POA: Diagnosis not present

## 2017-04-14 DIAGNOSIS — Z87891 Personal history of nicotine dependence: Secondary | ICD-10-CM | POA: Diagnosis not present

## 2017-04-14 DIAGNOSIS — E785 Hyperlipidemia, unspecified: Secondary | ICD-10-CM | POA: Diagnosis not present

## 2017-04-14 LAB — BASIC METABOLIC PANEL
Anion gap: 11 (ref 5–15)
BUN: 14 mg/dL (ref 6–20)
CO2: 25 mmol/L (ref 22–32)
Calcium: 9.2 mg/dL (ref 8.9–10.3)
Chloride: 101 mmol/L (ref 101–111)
Creatinine, Ser: 0.94 mg/dL (ref 0.44–1.00)
GFR calc Af Amer: >60 mL/min (ref >60)
GFR calc non Af Amer: >60 mL/min (ref >60)
Glucose, Bld: 323 mg/dL — ABNORMAL HIGH (ref 65–99)
Potassium: 3.8 mmol/L (ref 3.5–5.1)
Sodium: 137 mmol/L (ref 135–145)

## 2017-04-14 MED ORDER — FUROSEMIDE 40 MG PO TABS: ORAL_TABLET | ORAL | 3 refills | Status: DC

## 2017-04-14 NOTE — Progress Notes (Signed)
Patient ID: [name], female   DOB: 18-Mar-1950, 67 y.o.   MRN: [id number]   Advanced Heart Failure Clinic Note   PCP: Dr. 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 female with history of nonischemic cardiomyopathy.  She was admitted with CHF exacerbation in 02/2012.  EF 20-25% on echo, LHC with nonobstructive CAD.  She was started on cardiac meds and discharged.  In 07/2012, she was admitted again with CHF exacerbation.  She had run out of Lasix.  She was taking her other heart medications as ordered, however.  She was diuresed and discharged. She had a chronic LBBB, and Medtronic CRT-D device was placed in 10/14.  She was admitted in 6/16 with hypertensive emergency and CHF exacerbation.   Also of note, she had PFTs in 9/16 showing restrictive spirometry with low lung volume and DLCO.  She was supposed to get a high resolution CT to evaluate for interstitial lung disease but never had the study.     Admitted March 2018 with urosepsis--> E Coli Bacteremia. Completed antibiotic course. EF was down from previous to 30-35%. Also had atrial fibrillation so she was loaded on amiodarone. Placed on eliquis. Discharge weight was 208 pounds.  She is now off amiodarone.   Admitted to [facility] 1/16 -> 1/17 with CP and dizziness. Found to be in Afib. Converted spontaneously overnight with BB and diuresis.   She presents today for regular follow up.  At last visit given extra lasix for 2 days. Weight down several lbs but trended back up per patient. She has no complaint. Denies SOB on flat ground. Denies CP, lightheadedness or dizziness. No palpitations. No CP. Taking all medications as directed. Hasn't taken any additional lasix since last visit.   Optivol: Thoracic impedence below threshold but trending towards normal. Fluid elevated with recent crossing. Pt active for > 4 hours daily. No AF. No VT/VF.   Labs (2/14): SPEP negative, UPEP negative, HIV negative  PMH: 1. HTN 2. Type II  diabetes 3. Nonischemic cardiomyopathy: ? Due to HTN versus LBBB CMP.  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  Echo (2/14) with EF 20-25%.  Echo (7/14) with EF 25%, diffuse hypokinesis.  HIV, SPEP, UPEP negative.  Has LBBB. CRT-D 10/2012 (Medtronic).  Echo (4/15) with EF 45-50%, mild diffuse hypokinesis, PA systolic pressure 38 mmHg.  Echo (6/16) with EF 40-45%, mild LVH, septal and inferior hypokinesis.   - Echo (3/18): EF 30-35%. Grade 1 DD - Echo (6/18): EF 45-50%, moderate LVH, normal RV size with mildly decreased systolic function.  4. Chronic LBBB 5. Right TKR 6. Bilateral THR.  7. Hyperlipidemia 8. ?COPD: Has oxygen for use with exertion.  - PFTs (9/16) with FEV1 77%, FVC 76%, ratio 101%, TLC 63%, DLCO 41% => moderate restrictive deficit  9. Atrial fibrillation: Paroxysmal.   SH: Prior smoker, quit 2/14.  Never drank ETOH.  No drugs. Lives with son.   FH: Mother with "heart trouble."   Review of systems complete and found to be negative unless listed in HPI.    Current Outpatient Medications  Medication Sig Dispense Refill  . acetaminophen (TYLENOL) 500 MG tablet Take 1 tablet (500 mg total) by mouth every 6 (six) hours as needed. (Patient taking differently: Take 500 mg by mouth every 6 (six) hours as needed for mild pain. ) 30 tablet 0  . albuterol (PROVENTIL HFA;VENTOLIN HFA) 108 (90 Base) MCG/ACT inhaler Inhale 2 puffs into the lungs every 6 (six) hours as needed for  wheezing or shortness of breath. 18 g 0  . apixaban (ELIQUIS) 5 MG TABS tablet Take 1 tablet (5 mg total) by mouth 2 (two) times daily. 60 tablet 3  . atorvastatin (LIPITOR) 40 MG tablet Take 1 tablet (40 mg total) by mouth daily. 30 tablet 3  . carvedilol (COREG) 25 MG tablet Take 1 tablet (25 mg total) by mouth 2 (two) times daily with a meal. 60 tablet 11  . furosemide (LASIX) 40 MG tablet Take 1 tablet (40 mg total) by mouth daily. 90 tablet 3  . glucose blood (FREESTYLE TEST STRIPS) test strip Use as instructed 100  each 0  . glucose monitoring kit (FREESTYLE) monitoring kit 1 each by Does not apply route 4 (four) times daily - after meals and at bedtime. 1 month Diabetic Testing Supplies for QAC-QHS accuchecks. 1 each 1  . insulin aspart (NOVOLOG FLEXPEN) 100 UNIT/ML FlexPen 0-20 Units, Subcutaneous, 3 times daily with meals CBG < 70: implement hypoglycemia protocol CBG 70 - 120: 0 units CBG 121 - 150: 3 units CBG 151 - 200: 4 units CBG 201 - 250: 7 units CBG 251 - 300: 11 units CBG 301 - 350: 15 units CBG 351 - 400: 20 units CBG > 400: call MD 15 mL 0  . Insulin Glargine (LANTUS SOLOSTAR) 100 UNIT/ML Solostar Pen Inject 50 Units into the skin daily at 10 pm.    . Insulin Pen Needle 32G X 8 MM MISC Use as directed 100 each 0  . isosorbide-hydrALAZINE (BIDIL) 20-37.5 MG tablet Take 2 tablets by mouth 3 (three) times daily. 180 tablet 6  . Lancets (FREESTYLE) lancets Use as instructed 100 each 0  . metFORMIN (GLUCOPHAGE) 1000 MG tablet TAKE 1 TABLET(1000 MG) BY MOUTH TWICE DAILY WITH A MEAL 60 tablet 0  . naproxen sodium (ALEVE) 220 MG tablet Take 220 mg daily as needed by mouth (pain).    . Oxycodone HCl 10 MG TABS Take 10 mg by mouth 3 (three) times daily.  0  . potassium chloride 20 MEQ TBCR Take 40 mEq by mouth daily. 60 tablet 3  . sacubitril-valsartan (ENTRESTO) 97-103 MG Take 1 tablet by mouth 2 (two) times daily. 60 tablet 6  . spironolactone (ALDACTONE) 25 MG tablet Take 1 tablet (25 mg total) by mouth daily. 30 tablet 3   No current facility-administered medications for this encounter.    Vitals:   04/14/17 0811  BP: (!) 162/88  Pulse: 81  SpO2: 96%  Weight: 234 lb (106.1 kg)    Wt Readings from Last 3 Encounters:  04/14/17 234 lb (106.1 kg)  03/17/17 236 lb (107 kg)  03/15/17 220 lb (99.8 kg)    Physical exam General: Well appearing. No resp difficulty. HEENT: Normal Neck: Supple. JVP 7-8 cm. Carotids 2+ bilat; no bruits. No thyromegaly or nodule noted. Cor: PMI nondisplaced.  RRR, No M/G/R noted Lungs: CTAB, normal effort. Abdomen: Soft, non-tender, non-distended, no HSM. No bruits or masses. +BS  Extremities: No cyanosis, clubbing, or rash. R and LLE no edema.  Neuro: Alert & orientedx3, cranial nerves grossly intact. moves all 4 extremities w/o difficulty. Affect pleasant   Assessment/Plan: 1. Chronic systolic CHF: Nonischemic cardiomyopathy, HTN. S/P CRT-D (Medtronic). Echo 06/2016 with improved EF 45-50%. - NYHA II-III symptoms.  - Volume status mildly elevated on exam and optivol.  - Increase lasix to 40 mg BID alternating daily with 40 mg daily. BMET today and repeat 2 weeks.  - Continue Coreg 25 mg BID.  -  Continue Entresto 97/103 mg BID - Continue bidil 2 tabs TID - Continue spiro 25 mg daily.  - Reinforced fluid restriction to < 2 L daily, sodium restriction to less than 2000 mg daily, and the importance of daily weights.    2. Hyperlipidemia:  - Continue statin. No change.   3. HTN:  - Elevated on arrival but she just took her medication.  - Meds as above. No changes with diuresis.   4. PAF: -  - No further Afib on Optivol.  - Continue Eliquis for anticoagulation. She states she has not missed any doses.  - Can consider amio if continues to have recurrent Afib  Meds and labs as above. Repeat BMET 2 weeks. RTC 6 weeks. Sooner with symptoms.    Colleen Friar, PA-C  8:41 AM  04/14/2017  Greater than 50% of the 25 minute visit was spent in counseling/coordination of care regarding disease state education, salt/fluid restriction, sliding scale diuretics, and medication compliance.

## 2017-04-14 NOTE — Patient Instructions (Signed)
Routine lab work today. Will notify you of abnormal results, otherwise no news is good news!  Return in 2 weeks for repeat labs.  Follow up 6 weeks with Oda Kilts PA-C.  Take all medication as prescribed the day of your appointment. Bring all medications with you to your appointment.  Do the following things EVERYDAY: 1) Weigh yourself in the morning before breakfast. Write it down and keep it in a log. 2) Take your medicines as prescribed 3) Eat low salt foods-Limit salt (sodium) to 2000 mg per day.  4) Stay as active as you can everyday 5) Limit all fluids for the day to less than 2 liters

## 2017-04-16 ENCOUNTER — Other Ambulatory Visit: Payer: Self-pay | Admitting: Nurse Practitioner

## 2017-04-16 DIAGNOSIS — Z139 Encounter for screening, unspecified: Secondary | ICD-10-CM

## 2017-04-21 ENCOUNTER — Other Ambulatory Visit (HOSPITAL_COMMUNITY): Payer: Self-pay | Admitting: *Deleted

## 2017-04-21 ENCOUNTER — Other Ambulatory Visit (HOSPITAL_COMMUNITY): Payer: Self-pay

## 2017-04-21 MED ORDER — FUROSEMIDE 40 MG PO TABS: ORAL_TABLET | ORAL | 3 refills | Status: DC

## 2017-04-21 NOTE — Progress Notes (Signed)
Paramedicine Encounter    Patient ID: [name], female    DOB: 1950-03-22, 67 y.o.   MRN: [id number]    Patient Care Team: [name], MD as PCP - General (Family Medicine)  Patient Active Problem List   Diagnosis Date Noted  . Atrial fibrillation ([place]) 02/11/2017  . Paroxysmal A-fib ([place])   . Biventricular automatic implantable cardioverter defibrillator in situ   . Sepsis ([place]) 04/20/2016  . UTI (urinary tract infection) 04/20/2016  . Dyspnea 07/19/2015  . Interstitial lung disease ([place]) 11/20/2014  . SIRS (systemic inflammatory response syndrome) ([place]) 02/03/2014  . IDDM (insulin dependent diabetes mellitus) ([place]) 02/03/2014  . Tachycardia 06/14/2013  . Chronic systolic CHF (congestive heart failure) ([place]) 09/29/2012  . Hypoxia 03/06/2012  . Hypertensive heart disease 03/06/2012  . Nonischemic cardiomyopathy ([place]) 03/06/2012  . Tobacco abuse 03/06/2012  . Type 2 diabetes mellitus ([place]) 03/06/2012  . Hypertension 03/06/2012  . CAD (coronary artery disease), native coronary artery 03/06/2012  . Hypokalemia 03/06/2012  . Acute bronchitis 02/01/2009  . Sleep apnea 02/01/2009  . Chest pain 02/01/2009    Current Outpatient Medications:  .  apixaban (ELIQUIS) 5 MG TABS tablet, Take 1 tablet (5 mg total) by mouth 2 (two) times daily., Disp: 60 tablet, Rfl: 3 .  atorvastatin (LIPITOR) 40 MG tablet, Take 1 tablet (40 mg total) by mouth daily., Disp: 30 tablet, Rfl: 3 .  carvedilol (COREG) 25 MG tablet, Take 1 tablet (25 mg total) by mouth 2 (two) times daily with a meal., Disp: 60 tablet, Rfl: 11 .  furosemide (LASIX) 40 MG tablet, Alternate 40 mg tablet ONCE daily every other day with 40 mg tablet TWICE daily every other day., Disp: 135 tablet, Rfl: 3 .  glucose monitoring kit (FREESTYLE) monitoring kit, 1 each by Does not apply route 4 (four) times daily - after meals and at bedtime. 1 month Diabetic Testing Supplies for QAC-QHS accuchecks., Disp: 1 each, Rfl: 1 .   insulin aspart (NOVOLOG FLEXPEN) 100 UNIT/ML FlexPen, 0-20 Units, Subcutaneous, 3 times daily with meals CBG < 70: implement hypoglycemia protocol CBG 70 - 120: 0 units CBG 121 - 150: 3 units CBG 151 - 200: 4 units CBG 201 - 250: 7 units CBG 251 - 300: 11 units CBG 301 - 350: 15 units CBG 351 - 400: 20 units CBG > 400: call MD, Disp: 15 mL, Rfl: 0 .  Insulin Glargine (LANTUS SOLOSTAR) 100 UNIT/ML Solostar Pen, Inject 50 Units into the skin daily at 10 pm., Disp: , Rfl:  .  Insulin Pen Needle 32G X 8 MM MISC, Use as directed, Disp: 100 each, Rfl: 0 .  isosorbide-hydrALAZINE (BIDIL) 20-37.5 MG tablet, Take 2 tablets by mouth 3 (three) times daily., Disp: 180 tablet, Rfl: 6 .  Lancets (FREESTYLE) lancets, Use as instructed, Disp: 100 each, Rfl: 0 .  metFORMIN (GLUCOPHAGE) 1000 MG tablet, TAKE 1 TABLET(1000 MG) BY MOUTH TWICE DAILY WITH A MEAL, Disp: 60 tablet, Rfl: 0 .  potassium chloride 20 MEQ TBCR, Take 40 mEq by mouth daily., Disp: 60 tablet, Rfl: 3 .  sacubitril-valsartan (ENTRESTO) 97-103 MG, Take 1 tablet by mouth 2 (two) times daily., Disp: 60 tablet, Rfl: 6 .  spironolactone (ALDACTONE) 25 MG tablet, Take 1 tablet (25 mg total) by mouth daily., Disp: 30 tablet, Rfl: 3 .  acetaminophen (TYLENOL) 500 MG tablet, Take 1 tablet (500 mg total) by mouth every 6 (six) hours as needed. (Patient taking differently: Take 500 mg by mouth every 6 (six)  hours as needed for mild pain. ), Disp: 30 tablet, Rfl: 0 .  albuterol (PROVENTIL HFA;VENTOLIN HFA) 108 (90 Base) MCG/ACT inhaler, Inhale 2 puffs into the lungs every 6 (six) hours as needed for wheezing or shortness of breath., Disp: 18 g, Rfl: 0 .  glucose blood (FREESTYLE TEST STRIPS) test strip, Use as instructed, Disp: 100 each, Rfl: 0 .  naproxen sodium (ALEVE) 220 MG tablet, Take 220 mg daily as needed by mouth (pain)., Disp: , Rfl:  .  Oxycodone HCl 10 MG TABS, Take 10 mg by mouth 3 (three) times daily., Disp: , Rfl: 0 Allergies  Allergen Reactions  .  Morphine And Related Nausea And Vomiting and Other (See Comments)    Severe nausea     Social History   Socioeconomic History  . Marital status: Single    Spouse name: Not on file  . Number of children: Not on file  . Years of education: Not on file  . Highest education level: Not on file  Occupational History  . Not on file  Social Needs  . Financial resource strain: Not on file  . Food insecurity:    Worry: Not on file    Inability: Not on file  . Transportation needs:    Medical: Not on file    Non-medical: Not on file  Tobacco Use  . Smoking status: Former Smoker    Packs/day: 1.00    Years: 40.00    Pack years: 40.00    Types: Cigarettes    Start date: 06/23/1972    Last attempt to quit: 03/26/2012    Years since quitting: 5.0  . Smokeless tobacco: Never Used  Substance and Sexual Activity  . Alcohol use: No  . Drug use: No  . Sexual activity: Yes  Lifestyle  . Physical activity:    Days per week: Not on file    Minutes per session: Not on file  . Stress: Not on file  Relationships  . Social connections:    Talks on phone: Not on file    Gets together: Not on file    Attends religious service: Not on file    Active member of club or organization: Not on file    Attends meetings of clubs or organizations: Not on file    Relationship status: Not on file  . Intimate partner violence:    Fear of current or ex partner: Not on file    Emotionally abused: Not on file    Physically abused: Not on file    Forced sexual activity: Not on file  Other Topics Concern  . Not on file  Social History Narrative  . Not on file    Physical Exam  Pulmonary/Chest: No respiratory distress.  Abdominal: She exhibits no distension. There is no tenderness.  Musculoskeletal: She exhibits no edema.  Skin: Skin is warm and dry. She is not diaphoretic.        Future Appointments  Date Time Provider [place]  04/28/2017  8:45 AM [facility] LAB [facility] None  05/08/2017   8:20 AM [facility] MM 2 [facility]  05/26/2017  8:30 AM [facility] PA/NP [facility] None  06/02/2017 11:30 AM [facility] DEVICE REMOTES [facility]    ATF pt CAO x4 sitting in the living room watching tv.  Pt stated that she took her meds prior to my arrival.  Pt stated that she has been taking all of meds without missing any. Pt stated that she has been keeping up with her  low sodium diet. Pt is missing a few pill bottles and she stated that she is having financial issues.  She stated that she will see if her son can get her pills later this week and I will return to revise her box.  Pt denies sob, dizziness and chest pain. rx bottles verified and pill box filled.  She stated that she has an appointment this week to see her "new pcp".     BP (!) 150/80 (BP Location: Left Arm, Patient Position: Sitting, Cuff Size: Normal)   Pulse 85   Resp 16   SpO2 98%  cbg 260  Weight yesterday-pt hasn't weighed   **rx called in: entresto filled until thurs 1st box bidil filled until sun afternoon 1st Carvedilol completely out  Brance Dartt, EMT Paramedic 04/22/2017    ACTION: Home visit completed

## 2017-04-22 ENCOUNTER — Encounter (HOSPITAL_COMMUNITY): Payer: Self-pay

## 2017-04-28 ENCOUNTER — Inpatient Hospital Stay (HOSPITAL_COMMUNITY): Admission: RE | Admit: 2017-04-28 | Payer: Medicare Other | Source: Ambulatory Visit

## 2017-04-28 ENCOUNTER — Other Ambulatory Visit (HOSPITAL_COMMUNITY): Payer: Self-pay

## 2017-04-28 NOTE — Progress Notes (Signed)
Paramedicine Encounter    Patient ID: [name], female    DOB: 01-11-1951, 67 y.o.   MRN: [id number]    Patient Care Team: [name], MD as PCP - General (Family Medicine)  Patient Active Problem List   Diagnosis Date Noted  . Atrial fibrillation ([place]) 02/11/2017  . Paroxysmal A-fib ([place])   . Biventricular automatic implantable cardioverter defibrillator in situ   . Sepsis ([place]) 04/20/2016  . UTI (urinary tract infection) 04/20/2016  . Dyspnea 07/19/2015  . Interstitial lung disease ([place]) 11/20/2014  . SIRS (systemic inflammatory response syndrome) ([place]) 02/03/2014  . IDDM (insulin dependent diabetes mellitus) ([place]) 02/03/2014  . Tachycardia 06/14/2013  . Chronic systolic CHF (congestive heart failure) ([place]) 09/29/2012  . Hypoxia 03/06/2012  . Hypertensive heart disease 03/06/2012  . Nonischemic cardiomyopathy ([place]) 03/06/2012  . Tobacco abuse 03/06/2012  . Type 2 diabetes mellitus ([place]) 03/06/2012  . Hypertension 03/06/2012  . CAD (coronary artery disease), native coronary artery 03/06/2012  . Hypokalemia 03/06/2012  . Acute bronchitis 02/01/2009  . Sleep apnea 02/01/2009  . Chest pain 02/01/2009    Current Outpatient Medications:  .  acetaminophen (TYLENOL) 500 MG tablet, Take 1 tablet (500 mg total) by mouth every 6 (six) hours as needed. (Patient taking differently: Take 500 mg by mouth every 6 (six) hours as needed for mild pain. ), Disp: 30 tablet, Rfl: 0 .  albuterol (PROVENTIL HFA;VENTOLIN HFA) 108 (90 Base) MCG/ACT inhaler, Inhale 2 puffs into the lungs every 6 (six) hours as needed for wheezing or shortness of breath., Disp: 18 g, Rfl: 0 .  apixaban (ELIQUIS) 5 MG TABS tablet, Take 1 tablet (5 mg total) by mouth 2 (two) times daily., Disp: 60 tablet, Rfl: 3 .  atorvastatin (LIPITOR) 40 MG tablet, Take 1 tablet (40 mg total) by mouth daily., Disp: 30 tablet, Rfl: 3 .  carvedilol (COREG) 25 MG tablet, Take 1 tablet (25 mg total) by mouth 2 (two) times  daily with a meal., Disp: 60 tablet, Rfl: 11 .  furosemide (LASIX) 40 MG tablet, Alternate 40 mg tablet ONCE daily every other day with 40 mg tablet TWICE daily every other day., Disp: 135 tablet, Rfl: 3 .  glucose blood (FREESTYLE TEST STRIPS) test strip, Use as instructed, Disp: 100 each, Rfl: 0 .  glucose monitoring kit (FREESTYLE) monitoring kit, 1 each by Does not apply route 4 (four) times daily - after meals and at bedtime. 1 month Diabetic Testing Supplies for QAC-QHS accuchecks., Disp: 1 each, Rfl: 1 .  insulin aspart (NOVOLOG FLEXPEN) 100 UNIT/ML FlexPen, 0-20 Units, Subcutaneous, 3 times daily with meals CBG < 70: implement hypoglycemia protocol CBG 70 - 120: 0 units CBG 121 - 150: 3 units CBG 151 - 200: 4 units CBG 201 - 250: 7 units CBG 251 - 300: 11 units CBG 301 - 350: 15 units CBG 351 - 400: 20 units CBG > 400: call MD, Disp: 15 mL, Rfl: 0 .  Insulin Glargine (LANTUS SOLOSTAR) 100 UNIT/ML Solostar Pen, Inject 50 Units into the skin daily at 10 pm., Disp: , Rfl:  .  Insulin Pen Needle 32G X 8 MM MISC, Use as directed, Disp: 100 each, Rfl: 0 .  isosorbide-hydrALAZINE (BIDIL) 20-37.5 MG tablet, Take 2 tablets by mouth 3 (three) times daily., Disp: 180 tablet, Rfl: 6 .  Lancets (FREESTYLE) lancets, Use as instructed, Disp: 100 each, Rfl: 0 .  metFORMIN (GLUCOPHAGE) 1000 MG tablet, TAKE 1 TABLET(1000 MG) BY MOUTH TWICE DAILY WITH A MEAL, Disp:  60 tablet, Rfl: 0 .  naproxen sodium (ALEVE) 220 MG tablet, Take 220 mg daily as needed by mouth (pain)., Disp: , Rfl:  .  Oxycodone HCl 10 MG TABS, Take 10 mg by mouth 3 (three) times daily., Disp: , Rfl: 0 .  potassium chloride 20 MEQ TBCR, Take 40 mEq by mouth daily., Disp: 60 tablet, Rfl: 3 .  sacubitril-valsartan (ENTRESTO) 97-103 MG, Take 1 tablet by mouth 2 (two) times daily., Disp: 60 tablet, Rfl: 6 .  spironolactone (ALDACTONE) 25 MG tablet, Take 1 tablet (25 mg total) by mouth daily., Disp: 30 tablet, Rfl: 3 Allergies  Allergen Reactions   . Morphine And Related Nausea And Vomiting and Other (See Comments)    Severe nausea     Social History   Socioeconomic History  . Marital status: Single    Spouse name: Not on file  . Number of children: Not on file  . Years of education: Not on file  . Highest education level: Not on file  Occupational History  . Not on file  Social Needs  . Financial resource strain: Not on file  . Food insecurity:    Worry: Not on file    Inability: Not on file  . Transportation needs:    Medical: Not on file    Non-medical: Not on file  Tobacco Use  . Smoking status: Former Smoker    Packs/day: 1.00    Years: 40.00    Pack years: 40.00    Types: Cigarettes    Start date: 06/23/1972    Last attempt to quit: 03/26/2012    Years since quitting: 5.0  . Smokeless tobacco: Never Used  Substance and Sexual Activity  . Alcohol use: No  . Drug use: No  . Sexual activity: Yes  Lifestyle  . Physical activity:    Days per week: Not on file    Minutes per session: Not on file  . Stress: Not on file  Relationships  . Social connections:    Talks on phone: Not on file    Gets together: Not on file    Attends religious service: Not on file    Active member of club or organization: Not on file    Attends meetings of clubs or organizations: Not on file    Relationship status: Not on file  . Intimate partner violence:    Fear of current or ex partner: Not on file    Emotionally abused: Not on file    Physically abused: Not on file    Forced sexual activity: Not on file  Other Topics Concern  . Not on file  Social History Narrative  . Not on file    Physical Exam  Pulmonary/Chest: No respiratory distress. [name] has no wheezes.  Abdominal: [name] exhibits no distension. There is no tenderness.  Musculoskeletal: [name] exhibits no edema.  Skin: Skin is warm and dry. [name] is not diaphoretic.        Future Appointments  Date Time Provider [place]  04/29/2017 11:30 AM [facility] LAB  [facility] None  05/08/2017  8:20 AM [facility] MM 2 [facility]  05/26/2017  8:30 AM [facility] PA/NP [facility] None  06/02/2017 11:30 AM [facility] DEVICE REMOTES [facility]    ATF [name] CAO x4 standing outside working on [name] car, [name] appears to [name] upset about family issues.  Today's visit is to revise [name]'s pill box, since [name] was missing a few rx bottles during our last visit.  [name] denies sob, dizziness  and chest pain.  [name] has taken [name] meds without missing [name] medications within this past week.  [name] stated that [name] has been feeling better and [name] hasn't fallen in weeks.  [name] is currently experiencing financial issues and wants to move out of [name] house (find something cheaper). [name] has a lot of different people living with [name] from time to time which seems to be stressing [name] out.  [name]'s vitals noted.  rx bottles verified and pill box refilled (2 boxes).  [name] stated that [name]'s about to take 50 nunits of insulin when I left due to [name] CBG.  [name] has a new pcp due because [name] last pcp is under investigation.    BP 132/78 (BP Location: Right Arm, Patient Position: Sitting, Cuff Size: Normal)   Pulse (!) 111   Resp 16   Wt 223 lb 9.6 oz (101.4 kg)   SpO2 98%   BMI 35.02 kg/m  cbg 481   Weight yesterday-didn't weigh Last visit weight-230 (during last pcp appointment last week)  **rx needed to be called in prior to next visit: Spirolactone eliquis metformin  Symeon Puleo, EMT Paramedic 04/28/2017    ACTION: Home visit completed

## 2017-04-29 ENCOUNTER — Other Ambulatory Visit (HOSPITAL_COMMUNITY): Payer: Medicare Other

## 2017-04-30 ENCOUNTER — Other Ambulatory Visit (HOSPITAL_COMMUNITY): Payer: Self-pay | Admitting: Internal Medicine

## 2017-05-01 ENCOUNTER — Other Ambulatory Visit (HOSPITAL_COMMUNITY): Payer: Self-pay | Admitting: *Deleted

## 2017-05-01 MED ORDER — APIXABAN 5 MG PO TABS: 5.0000 mg | ORAL_TABLET | Freq: Two times a day (BID) | ORAL | 3 refills | Status: DC

## 2017-05-08 ENCOUNTER — Ambulatory Visit: Payer: Medicare Other

## 2017-05-19 ENCOUNTER — Telehealth (HOSPITAL_COMMUNITY): Payer: Self-pay | Admitting: *Deleted

## 2017-05-19 ENCOUNTER — Other Ambulatory Visit (HOSPITAL_COMMUNITY): Payer: Self-pay

## 2017-05-19 MED ORDER — FUROSEMIDE 40 MG PO TABS: 40.0000 mg | ORAL_TABLET | Freq: Two times a day (BID) | ORAL | 3 refills | Status: DC

## 2017-05-19 NOTE — Progress Notes (Signed)
Paramedicine Encounter    Patient ID: JENNYLEE UEHARA, female    DOB: [birth year], 67 y.o.   MRN: [id number]    Patient Care Team: [name], MD as PCP - General (Family Medicine)  Patient Active Problem List   Diagnosis Date Noted  . Atrial fibrillation ([place]) 02/11/2017  . Paroxysmal A-fib ([place])   . Biventricular automatic implantable cardioverter defibrillator in situ   . Sepsis ([place]) 04/20/2016  . UTI (urinary tract infection) 04/20/2016  . Dyspnea 07/19/2015  . Interstitial lung disease ([place]) 11/20/2014  . SIRS (systemic inflammatory response syndrome) ([place]) 02/03/2014  . IDDM (insulin dependent diabetes mellitus) ([place]) 02/03/2014  . Tachycardia 06/14/2013  . Chronic systolic CHF (congestive heart failure) ([place]) 09/29/2012  . Hypoxia 03/06/2012  . Hypertensive heart disease 03/06/2012  . Nonischemic cardiomyopathy ([place]) 03/06/2012  . Tobacco abuse 03/06/2012  . Type 2 diabetes mellitus ([place]) 03/06/2012  . Hypertension 03/06/2012  . CAD (coronary artery disease), native coronary artery 03/06/2012  . Hypokalemia 03/06/2012  . Acute bronchitis 02/01/2009  . Sleep apnea 02/01/2009  . Chest pain 02/01/2009    Current Outpatient Medications:  .  apixaban (ELIQUIS) 5 MG TABS tablet, Take 1 tablet (5 mg total) by mouth 2 (two) times daily., Disp: 60 tablet, Rfl: 3 .  atorvastatin (LIPITOR) 40 MG tablet, Take 1 tablet (40 mg total) by mouth daily., Disp: 30 tablet, Rfl: 3 .  carvedilol (COREG) 25 MG tablet, Take 1 tablet (25 mg total) by mouth 2 (two) times daily with a meal., Disp: 60 tablet, Rfl: 11 .  furosemide (LASIX) 40 MG tablet, Alternate 40 mg tablet ONCE daily every other day with 40 mg tablet TWICE daily every other day., Disp: 135 tablet, Rfl: 3 .  isosorbide-hydrALAZINE (BIDIL) 20-37.5 MG tablet, Take 2 tablets by mouth 3 (three) times daily., Disp: 180 tablet, Rfl: 6 .  metFORMIN (GLUCOPHAGE) 1000 MG tablet, TAKE 1 TABLET(1000 MG) BY MOUTH TWICE DAILY WITH A  MEAL, Disp: 60 tablet, Rfl: 0 .  potassium chloride 20 MEQ TBCR, Take 40 mEq by mouth daily., Disp: 60 tablet, Rfl: 3 .  sacubitril-valsartan (ENTRESTO) 97-103 MG, Take 1 tablet by mouth 2 (two) times daily., Disp: 60 tablet, Rfl: 6 .  spironolactone (ALDACTONE) 25 MG tablet, Take 1 tablet (25 mg total) by mouth daily., Disp: 30 tablet, Rfl: 3 .  spironolactone (ALDACTONE) 25 MG tablet, TAKE 1 TABLET(25 MG) BY MOUTH DAILY, Disp: 30 tablet, Rfl: 3 .  acetaminophen (TYLENOL) 500 MG tablet, Take 1 tablet (500 mg total) by mouth every 6 (six) hours as needed. (Patient taking differently: Take 500 mg by mouth every 6 (six) hours as needed for mild pain. ), Disp: 30 tablet, Rfl: 0 .  albuterol (PROVENTIL HFA;VENTOLIN HFA) 108 (90 Base) MCG/ACT inhaler, Inhale 2 puffs into the lungs every 6 (six) hours as needed for wheezing or shortness of breath., Disp: 18 g, Rfl: 0 .  glucose blood (FREESTYLE TEST STRIPS) test strip, Use as instructed, Disp: 100 each, Rfl: 0 .  glucose monitoring kit (FREESTYLE) monitoring kit, 1 each by Does not apply route 4 (four) times daily - after meals and at bedtime. 1 month Diabetic Testing Supplies for QAC-QHS accuchecks., Disp: 1 each, Rfl: 1 .  insulin aspart (NOVOLOG FLEXPEN) 100 UNIT/ML FlexPen, 0-20 Units, Subcutaneous, 3 times daily with meals CBG < 70: implement hypoglycemia protocol CBG 70 - 120: 0 units CBG 121 - 150: 3 units CBG 151 - 200: 4 units CBG 201 - 250: 7 units CBG  251 - 300: 11 units CBG 301 - 350: 15 units CBG 351 - 400: 20 units CBG > 400: call MD, Disp: 15 mL, Rfl: 0 .  Insulin Glargine (LANTUS SOLOSTAR) 100 UNIT/ML Solostar Pen, Inject 50 Units into the skin daily at 10 pm., Disp: , Rfl:  .  Insulin Pen Needle 32G X 8 MM MISC, Use as directed, Disp: 100 each, Rfl: 0 .  Lancets (FREESTYLE) lancets, Use as instructed, Disp: 100 each, Rfl: 0 .  naproxen sodium (ALEVE) 220 MG tablet, Take 220 mg daily as needed by mouth (pain)., Disp: , Rfl:  .  Oxycodone HCl  10 MG TABS, Take 10 mg by mouth 3 (three) times daily., Disp: , Rfl: 0 Allergies  Allergen Reactions  . Morphine And Related Nausea And Vomiting and Other (See Comments)    Severe nausea     Social History   Socioeconomic History  . Marital status: Single    Spouse name: Not on file  . Number of children: Not on file  . Years of education: Not on file  . Highest education level: Not on file  Occupational History  . Not on file  Social Needs  . Financial resource strain: Not on file  . Food insecurity:    Worry: Not on file    Inability: Not on file  . Transportation needs:    Medical: Not on file    Non-medical: Not on file  Tobacco Use  . Smoking status: Former Smoker    Packs/day: 1.00    Years: 40.00    Pack years: 40.00    Types: Cigarettes    Start date: 06/23/1972    Last attempt to quit: 03/26/2012    Years since quitting: 5.1  . Smokeless tobacco: Never Used  Substance and Sexual Activity  . Alcohol use: No  . Drug use: No  . Sexual activity: Yes  Lifestyle  . Physical activity:    Days per week: Not on file    Minutes per session: Not on file  . Stress: Not on file  Relationships  . Social connections:    Talks on phone: Not on file    Gets together: Not on file    Attends religious service: Not on file    Active member of club or organization: Not on file    Attends meetings of clubs or organizations: Not on file    Relationship status: Not on file  . Intimate partner violence:    Fear of current or ex partner: Not on file    Emotionally abused: Not on file    Physically abused: Not on file    Forced sexual activity: Not on file  Other Topics Concern  . Not on file  Social History Narrative  . Not on file    Physical Exam  Pulmonary/Chest: Effort normal. No respiratory distress.  Abdominal: Soft. She exhibits no distension.  Musculoskeletal: She exhibits no edema.  Neurological: She is alert.  Skin: She is not diaphoretic.         Future Appointments  Date Time Provider [place]  05/26/2017  8:30 AM [facility] PA/NP [facility] None  06/02/2017 11:30 AM [facility] DEVICE REMOTES [facility]    ATF pt CAO x4 sitting on the side of her bed, "just woke up from a nap".  Pt stated that she had just taken her medications before she took the nap (unknown how long ago that was).  Pt ran out of her meds in her pill box  over the weekend but took them directly out of the pill bottles.  Pt stated that she is still going through financial difficulties and can't afford all of her meds.  I was able to help with the co-pay when I picked them up from the pharmacy.  Pt stated that she has been feeling "good lately"; she denies sob, dizziness and chest pain.  rx bottles verified and pill box (2) refilled.  Pt stated that she will be able to pick up the eliquis tomorrow.     *taking lasix 57m twice daily because she "feels like she needs it due to weight gain";pt's weight at this time is 4lbs heavier than our last visit on 04/28/17.  I spoke with AJonni Sangerfrom the heart failure clinic. He advised to keep her on the lasix BID until Tuesday but she needs to keep her upcoming appointment on Tuesday.    BP 104/76   Pulse 93   Resp 16   Wt 227 lb 3.2 oz (103.1 kg)   SpO2 97%   BMI 35.58 kg/m  cbg 303 **rx called in: eliquis (filled until Sunday 1st box)   Before next visit poatssium, spirolactone  Weight yesterday-didn't weigh Last visit weight-223 (04/28/17)    Carly Applegate, EMT Paramedic 05/19/2017    ACTION: Home visit completed

## 2017-05-19 NOTE — Telephone Encounter (Signed)
Advanced Heart Failure Triage Encounter  Patient Name: [name]  Date of Call: 05/19/17  Problem:  [name] with paramedicine called to report patient had been taking lasix 40 mg BID instead of 40 mg Daily with 40 mg BID every other day.  Patient is feeling fine and weight stable.   Plan:  Per Barrington Ellison, PA that is fine, we will check labs at next scheduled visit.  New prescription sent for 40 mg BID.   Darron Doom, RN

## 2017-05-21 ENCOUNTER — Emergency Department (HOSPITAL_COMMUNITY)
Admission: EM | Admit: 2017-05-21 | Discharge: 2017-05-21 | Discharge status: Against Medical Advice | Payer: Medicare Other

## 2017-05-21 NOTE — ED Triage Notes (Signed)
Called for triage X 3 with no answer. 

## 2017-05-22 ENCOUNTER — Other Ambulatory Visit: Payer: Self-pay | Admitting: Internal Medicine

## 2017-05-25 ENCOUNTER — Other Ambulatory Visit: Payer: Self-pay

## 2017-05-25 ENCOUNTER — Encounter (HOSPITAL_COMMUNITY): Payer: Self-pay

## 2017-05-25 ENCOUNTER — Emergency Department (HOSPITAL_COMMUNITY)
Admission: EM | Admit: 2017-05-25 | Discharge: 2017-05-25 | Disposition: A | Discharge status: Home / Self Care | Payer: Medicare Other | Attending: Emergency Medicine | Admitting: Emergency Medicine

## 2017-05-25 ENCOUNTER — Emergency Department (HOSPITAL_COMMUNITY): Payer: Medicare Other

## 2017-05-25 DIAGNOSIS — I5042 Chronic combined systolic (congestive) and diastolic (congestive) heart failure: Secondary | ICD-10-CM | POA: Diagnosis not present

## 2017-05-25 DIAGNOSIS — R05 Cough: Secondary | ICD-10-CM | POA: Insufficient documentation

## 2017-05-25 DIAGNOSIS — Z7901 Long term (current) use of anticoagulants: Secondary | ICD-10-CM | POA: Insufficient documentation

## 2017-05-25 DIAGNOSIS — I110 Hypertensive heart disease with heart failure: Secondary | ICD-10-CM | POA: Insufficient documentation

## 2017-05-25 DIAGNOSIS — R059 Cough, unspecified: Secondary | ICD-10-CM

## 2017-05-25 DIAGNOSIS — J020 Streptococcal pharyngitis: Secondary | ICD-10-CM | POA: Diagnosis not present

## 2017-05-25 DIAGNOSIS — Z87891 Personal history of nicotine dependence: Secondary | ICD-10-CM | POA: Diagnosis not present

## 2017-05-25 DIAGNOSIS — E119 Type 2 diabetes mellitus without complications: Secondary | ICD-10-CM | POA: Diagnosis not present

## 2017-05-25 DIAGNOSIS — Z794 Long term (current) use of insulin: Secondary | ICD-10-CM | POA: Diagnosis not present

## 2017-05-25 DIAGNOSIS — Z79899 Other long term (current) drug therapy: Secondary | ICD-10-CM | POA: Diagnosis not present

## 2017-05-25 DIAGNOSIS — R509 Fever, unspecified: Secondary | ICD-10-CM | POA: Diagnosis not present

## 2017-05-25 DIAGNOSIS — R070 Pain in throat: Secondary | ICD-10-CM | POA: Diagnosis present

## 2017-05-25 DIAGNOSIS — J069 Acute upper respiratory infection, unspecified: Secondary | ICD-10-CM

## 2017-05-25 LAB — GROUP A STREP BY PCR: Group A Strep by PCR: DETECTED — AB

## 2017-05-25 MED ORDER — PHENOL 1.4 % MT LIQD
1.0000 | OROMUCOSAL | Status: DC | PRN
  Administered 2017-05-25: 1 via OROMUCOSAL
  Filled 2017-05-25: qty 177

## 2017-05-25 MED ORDER — IBUPROFEN 200 MG PO TABS
600.0000 mg | ORAL_TABLET | Freq: Once | ORAL | Status: AC
  Administered 2017-05-25: 600 mg via ORAL
  Filled 2017-05-25: qty 3

## 2017-05-25 MED ORDER — ACETAMINOPHEN 325 MG PO TABS: 650.0000 mg | ORAL_TABLET | Freq: Once | ORAL | Status: DC | PRN

## 2017-05-25 MED ORDER — PENICILLIN G BENZATHINE 1200000 UNIT/2ML IM SUSP
1.2000 10*6.[IU] | Freq: Once | INTRAMUSCULAR | Status: AC
  Administered 2017-05-25: 1.2 10*6.[IU] via INTRAMUSCULAR
  Filled 2017-05-25: qty 2

## 2017-05-25 NOTE — ED Notes (Signed)
Pt states she took Tylenol approximately 2 hours ago.

## 2017-05-25 NOTE — ED Provider Notes (Signed)
[facility] DEPT Provider Note   CSN: [id number] Arrival date & time: 05/25/17  1731     History   Chief Complaint Chief Complaint  Patient presents with  . Sore Throat    HPI [name] is a 67 y.o. female with a PMHx of anemia, CAD, chronic bronchitis, CHF, HTN, HLD, PAFib on eliquis, DM2, and other conditions listed below, who presents to the ED with complaints of gradual onset and worsening constant sore throat and URI symptoms that began last night.  Symptoms include sore throat, body aches, dry cough, subjective fever, and chills.  She has not tried anything for symptoms, no known aggravating factors.  No known sick contacts, she is a non-smoker.  Denies any recent travel.  She denies any rhinorrhea, drooling, trismus, ear pain or drainage, wheezing, CP, SOB, abd pain, N/V/D/C, hematuria, dysuria, numbness, tingling, focal weakness, rashes, or any other complaints at this time.  She says it feels like strep throat.  The history is provided by the patient and medical records. No language interpreter was used.  Sore Throat  Pertinent negatives include no chest pain, no abdominal pain and no shortness of breath.    Past Medical History:  Diagnosis Date  . Anemia    a. Noted on 07/2012 labs, instructed to f/u PCP.  Marland Kitchen Arthritis    "joints" (11/18/2012)  . CAD (coronary artery disease), native coronary artery    a. Nonobstructive by cath 02/2012 (done because of low EF).  . Chronic bronchitis ([place])    "~ every other year" (11/18/2012)  . Chronic combined systolic and diastolic CHF (congestive heart failure) ([place])    a. 03/05/12 echo:  LVEF 20-25%, moderate LVH , inferior and basal to mid septal akinesis, anterior moderate to severe hypokinesis and grade 2 diastolic dysfunction. b. EF 07/2012: EF still 25% (unclear medication compliance).  . Chronic lower back pain   . Headache(784.0)    "often; maybe not daily" (11/18/2012)  . High cholesterol   .  History of noncompliance with medical treatment   . Hypertension   . LBBB (left bundle branch block)   . Orthopnea   . Tobacco abuse   . Type II diabetes mellitus [facility])     Patient Active Problem List   Diagnosis Date Noted  . Atrial fibrillation ([place]) 02/11/2017  . Paroxysmal A-fib ([place])   . Biventricular automatic implantable cardioverter defibrillator in situ   . Sepsis ([place]) 04/20/2016  . UTI (urinary tract infection) 04/20/2016  . Dyspnea 07/19/2015  . Interstitial lung disease ([place]) 11/20/2014  . SIRS (systemic inflammatory response syndrome) ([place]) 02/03/2014  . IDDM (insulin dependent diabetes mellitus) ([place]) 02/03/2014  . Tachycardia 06/14/2013  . Chronic systolic CHF (congestive heart failure) ([place]) 09/29/2012  . Hypoxia 03/06/2012  . Hypertensive heart disease 03/06/2012  . Nonischemic cardiomyopathy ([place]) 03/06/2012  . Tobacco abuse 03/06/2012  . Type 2 diabetes mellitus ([place]) 03/06/2012  . Hypertension 03/06/2012  . CAD (coronary artery disease), native coronary artery 03/06/2012  . Hypokalemia 03/06/2012  . Acute bronchitis 02/01/2009  . Sleep apnea 02/01/2009  . Chest pain 02/01/2009    Past Surgical History:  Procedure Laterality Date  . BI-VENTRICULAR IMPLANTABLE CARDIOVERTER DEFIBRILLATOR N/A 11/18/2012   Procedure: BI-VENTRICULAR IMPLANTABLE CARDIOVERTER DEFIBRILLATOR  (CRT-D);  Surgeon: Evans Lance, MD;  Location[facility] C[facility] LAB;  Service: Cardiovascular;  Laterality: N/A;  . BI-VENTRICULAR IMPLANTABLE CARDIOVERTER DEFIBRILLATOR  (CRT-D)  11/18/2012  . CARDIAC CATHETERIZATION  03/04/12   nonobstructive CAD, elevated LVEDP and  tortuous vessels suggestive of long-standing hypertension  . COLONOSCOPY WITH PROPOFOL N/A 12/12/2016   Procedure: COLONOSCOPY WITH PROPOFOL;  Surgeon: Carol Ada, MD;  Location: [facility] ENDOSCOPY;  Service: Endoscopy;  Laterality: N/A;  . JOINT REPLACEMENT     Bilateral hip and right knee  . LEFT HEART CATH N/A 03/05/2012   Procedure:  LEFT HEART CATH;  Surgeon: Larey Dresser, MD;  Location: [facility] CATH LAB;  Service: Cardiovascular;  Laterality: N/A;     OB History   None      Home Medications    Prior to Admission medications   Medication Sig Start Date End Date Taking? Authorizing Provider  acetaminophen (TYLENOL) 500 MG tablet Take 1 tablet (500 mg total) by mouth every 6 (six) hours as needed. Patient taking differently: Take 500 mg by mouth every 6 (six) hours as needed for mild pain.  09/15/15   Leo Grosser, MD  albuterol (PROVENTIL HFA;VENTOLIN HFA) 108 (90 Base) MCG/ACT inhaler Inhale 2 puffs into the lungs every 6 (six) hours as needed for wheezing or shortness of breath. 04/25/16   Ghimire, Henreitta Leber, MD  apixaban (ELIQUIS) 5 MG TABS tablet Take 1 tablet (5 mg total) by mouth 2 (two) times daily. 05/01/17   Larey Dresser, MD  atorvastatin (LIPITOR) 40 MG tablet Take 1 tablet (40 mg total) by mouth daily. 05/22/16   Clegg, Amy D, NP  carvedilol (COREG) 25 MG tablet Take 1 tablet (25 mg total) by mouth 2 (two) times daily with a meal. 03/17/17   Tillery, Satira Mccallum, PA-C  furosemide (LASIX) 40 MG tablet Take 1 tablet (40 mg total) by mouth 2 (two) times daily. 05/19/17   Alaycia Friar, PA-C  glucose blood (FREESTYLE TEST STRIPS) test strip Use as instructed 04/25/16   Ghimire, Henreitta Leber, MD  glucose monitoring kit (FREESTYLE) monitoring kit 1 each by Does not apply route 4 (four) times daily - after meals and at bedtime. 1 month Diabetic Testing Supplies for QAC-QHS accuchecks. 04/25/16   Ghimire, Henreitta Leber, MD  insulin aspart (NOVOLOG FLEXPEN) 100 UNIT/ML FlexPen 0-20 Units, Subcutaneous, 3 times daily with meals CBG < 70: implement hypoglycemia protocol CBG 70 - 120: 0 units CBG 121 - 150: 3 units CBG 151 - 200: 4 units CBG 201 - 250: 7 units CBG 251 - 300: 11 units CBG 301 - 350: 15 units CBG 351 - 400: 20 units CBG > 400: call MD 04/25/16   Jonetta Osgood, MD  Insulin Glargine (LANTUS  SOLOSTAR) 100 UNIT/ML Solostar Pen Inject 50 Units into the skin daily at 10 pm.    [provider]  Insulin Pen Needle 32G X 8 MM MISC Use as directed 06/18/16   Arbutus Leas, NP  isosorbide-hydrALAZINE (BIDIL) 20-37.5 MG tablet Take 2 tablets by mouth 3 (three) times daily. 09/11/16   Arbutus Leas, NP  Lancets (FREESTYLE) lancets Use as instructed 06/18/16   Arbutus Leas, NP  metFORMIN (GLUCOPHAGE) 1000 MG tablet TAKE 1 TABLET(1000 MG) BY MOUTH TWICE DAILY WITH A MEAL 05/30/16   Quenna Friar, PA-C  naproxen sodium (ALEVE) 220 MG tablet Take 220 mg daily as needed by mouth (pain).    [provider]  Oxycodone HCl 10 MG TABS Take 10 mg by mouth 3 (three) times daily. 12/09/16   [provider]  potassium chloride 20 MEQ TBCR Take 40 mEq by mouth daily. 10/07/16   Arbutus Leas, NP  sacubitril-valsartan (ENTRESTO) 97-103 MG Take 1 tablet  by mouth 2 (two) times daily. 06/04/16   Clegg, Amy D, NP  spironolactone (ALDACTONE) 25 MG tablet Take 1 tablet (25 mg total) by mouth daily. 05/22/16   Clegg, Amy D, NP  spironolactone (ALDACTONE) 25 MG tablet TAKE 1 TABLET(25 MG) BY MOUTH DAILY 05/01/17   Bensimhon, Shaune Pascal, MD    Family History Family History  Problem Relation Age of Onset  . Heart disease Neg Hx     Social History Social History   Tobacco Use  . Smoking status: Former Smoker    Packs/day: 1.00    Years: 40.00    Pack years: 40.00    Types: Cigarettes    Start date: 06/23/1972    Last attempt to quit: 03/26/2012    Years since quitting: 5.1  . Smokeless tobacco: Never Used  Substance Use Topics  . Alcohol use: No  . Drug use: No     Allergies   Morphine and related   Review of Systems Review of Systems  Constitutional: Positive for chills and fever (subjective at home).  HENT: Positive for sore throat. Negative for drooling, ear discharge, ear pain, rhinorrhea and trouble swallowing.   Respiratory: Positive for cough. Negative for  shortness of breath and wheezing.   Cardiovascular: Negative for chest pain.  Gastrointestinal: Negative for abdominal pain, constipation, diarrhea, nausea and vomiting.  Genitourinary: Negative for dysuria and hematuria.  Musculoskeletal: Positive for myalgias (body aches). Negative for arthralgias.  Skin: Negative for rash.  Allergic/Immunologic: Positive for immunocompromised state (DM2).  Neurological: Negative for weakness and numbness.  Hematological: Bruises/bleeds easily (on eliquis).  Psychiatric/Behavioral: Negative for confusion.   All other systems reviewed and are negative for acute change except as noted in the HPI.    Physical Exam Updated Vital Signs BP (!) 155/137 (BP Location: Left Arm)   Pulse (!) 112   Temp (!) 101.5 F (38.6 C) (Oral)   Resp 17   Ht _0  (1.702 m)   Wt 104.3 kg (230 lb)   SpO2 96%   BMI 36.02 kg/m    Physical Exam  Constitutional: She is oriented to person, place, and time. She appears well-developed and well-nourished.  Non-toxic appearance. No distress.  Febrile to 101.5, nontoxic, NAD, not septic appearing  HENT:  Head: Normocephalic and atraumatic.  Nose: Nose normal.  Mouth/Throat: Uvula is midline and mucous membranes are normal. No trismus in the jaw. No uvula swelling. Oropharyngeal exudate, posterior oropharyngeal edema and posterior oropharyngeal erythema present. No tonsillar abscesses. Tonsils are 2+ on the right. Tonsils are 1+ on the left. No tonsillar exudate.  Nose clear. Oropharynx injected, without uvular swelling or deviation, no trismus or drooling, b/l tonsillar swelling 2+ on R and 1+ on L, +erythema, +exudates. No PTA or evidence of ludwig's, handling secretions well, no hot potato voice.   Eyes: Conjunctivae and EOM are normal. Right eye exhibits no discharge. Left eye exhibits no discharge.  Neck: Normal range of motion. Neck supple.  Cardiovascular: Regular rhythm, normal heart sounds and intact distal pulses.  Tachycardia present. Exam reveals no gallop and no friction rub.  No murmur heard. Mildly tachycardic in the low 100-110s  Pulmonary/Chest: Effort normal and breath sounds normal. No respiratory distress. She has no decreased breath sounds. She has no wheezes. She has no rhonchi. She has no rales.  CTAB in all lung fields, no w/r/r, no hypoxia or increased WOB, speaking in full sentences, SpO2 96% on RA   Abdominal: Soft. Normal appearance and bowel sounds are  normal. She exhibits no distension. There is no tenderness. There is no rigidity, no rebound, no guarding, no CVA tenderness, no tenderness at McBurney's point and negative Murphy's sign.  Musculoskeletal: Normal range of motion.  Neurological: She is alert and oriented to person, place, and time. She has normal strength. No sensory deficit.  Skin: Skin is warm, dry and intact. No rash noted.  Psychiatric: She has a normal mood and affect.  Nursing note and vitals reviewed.    ED Treatments / Results  Labs (all labs ordered are listed, but only abnormal results are displayed) Labs Reviewed  GROUP A STREP BY PCR - Abnormal; Notable for the following components:      Result Value   Group A Strep by PCR DETECTED (*)    All other components within normal limits    EKG None  Radiology Dg Chest 2 View  Result Date: 05/25/2017 CLINICAL DATA:  Cough and fever.  Evaluate for pneumonia EXAM: CHEST - 2 VIEW COMPARISON:  03/15/2017 FINDINGS: Cardiomegaly with biventricular ICD/pacer. Stable mediastinal contours. There is no edema, consolidation, effusion, or pneumothorax. Diffuse spondylotic change. IMPRESSION: No evidence of active disease. Electronically Signed   By: Monte Fantasia M.D.   On: 05/25/2017 18:55    Procedures Procedures (including critical care time)  Medications Ordered in ED Medications  phenol (CHLORASEPTIC) mouth spray 1 spray (1 spray Mouth/Throat Given 05/25/17 1930)  ibuprofen (ADVIL,MOTRIN) tablet 600 mg (600  mg Oral Given 05/25/17 1928)  penicillin g benzathine (BICILLIN LA) 1200000 UNIT/2ML injection 1.2 Million Units (1.2 Million Units Intramuscular Given 05/25/17 2003)     Initial Impression / Assessment and Plan / ED Course  I have reviewed the triage vital signs and the nursing notes.  Pertinent labs & imaging results that were available during my care of the patient were reviewed by me and considered in my medical decision making (see chart for details).     67 y.o. female here with sore throat and URI symptoms x1 day. On exam, lungs without w/r/r appreciated, throat injected with tonsillar swelling 2+ on R and 1+ on L but no evidence of PTA, no evidence of ludwigs, some exudates. Bilateral tonsillar and cervical LAD which is mildly TTP. Febrile to 101.5 and mildly tachycardic but does not appear septic. Will get CXR and strep testing, will give ibuprofen and chloraseptic spray then reassess shortly.   8:49 PM Strep testing positive, pt opting for PCN here for treatment. CXR negative for acute findings. Pt's vitals improving after ibuprofen. Overall, symptoms consistent with strep throat and URI, pt well appearing and doesn't appear to need any further emergent work up or intervention. Advised tylenol/ibuprofen use, and other OTC remedies for symptomatic relief. F/up with PCP in 5-7 days for recheck. I explained the diagnosis and have given explicit precautions to return to the ER including for any other new or worsening symptoms. The patient understands and accepts the medical plan as it's been dictated and I have answered their questions. Discharge instructions concerning home care and prescriptions have been given. The patient is STABLE and is discharged to home in good condition.    Final Clinical Impressions(s) / ED Diagnoses   Final diagnoses:  Strep pharyngitis  Cough  Upper respiratory tract infection, unspecified type  Fever, unspecified fever cause    ED Discharge Orders    [address], [place], [place] 05/25/17 2049    Duffy Bruce, MD 05/26/17 [phone]

## 2017-05-25 NOTE — ED Triage Notes (Signed)
Per EMS, patient c/o sore throat with cough since last night. Denies N/V/D. Tonsils red and swollen.

## 2017-05-25 NOTE — Discharge Instructions (Addendum)
You have strep throat, you were treated here today so you should start to improve over the next few days. Continue to stay well-hydrated. Gargle warm salt water and spit it out and use chloraseptic spray as needed for sore throat. Continue to alternate between Tylenol and Ibuprofen for pain or fever. Use Mucinex for cough suppression/expectoration of mucus. Use netipot and flonase to help with nasal congestion. May consider over-the-counter Benadryl or other antihistamine to decrease secretions and for help with your symptoms. Follow up with your primary care doctor in 5-7 days for recheck of ongoing symptoms. Return to emergency department for emergent changing or worsening of symptoms.

## 2017-05-26 ENCOUNTER — Encounter (HOSPITAL_COMMUNITY): Payer: Medicare Other

## 2017-05-26 ENCOUNTER — Telehealth: Payer: Self-pay | Admitting: *Deleted

## 2017-05-26 NOTE — Telephone Encounter (Signed)
Post ED Visit - Positive Culture Follow-up  Culture report reviewed by antimicrobial stewardship pharmacist:  []  Elenor Quinones, Pharm.D. []  Heide Guile, Pharm.D., BCPS AQ-ID []  Parks Neptune, Pharm.D., BCPS []  Alycia Rossetti, Pharm.D., BCPS []  [place], Pharm.D., BCPS, AAHIVP []  Legrand Como, Pharm.D., BCPS, AAHIVP []  Salome Arnt, PharmD, BCPS []  Jalene Mullet, PharmD []  Vincenza Hews, PharmD, BCPS Karmen Stabs, PharmD Candidate  Positive strep culture Treated with Penicillin, organism sensitive to the same and no further patient follow-up is required at this time.  Harlon Flor [facility] 05/26/2017, 10:06 AM

## 2017-06-02 ENCOUNTER — Encounter: Payer: Medicare Other | Admitting: *Deleted

## 2017-06-02 ENCOUNTER — Telehealth (HOSPITAL_COMMUNITY): Payer: Self-pay

## 2017-06-02 ENCOUNTER — Telehealth: Payer: Self-pay | Admitting: Cardiology

## 2017-06-02 NOTE — Telephone Encounter (Signed)
I called pt to schedule a CHP visit.  Pt stated that she rather schedule for next week and she stated that she have a pcp visit tomorrow.  I advised pt that she also has an [facility] with [facility].  She stated that she will reschedule the visit.

## 2017-06-02 NOTE — Telephone Encounter (Signed)
Spoke with pt and reminded pt of remote transmission that is due today. Pt verbalized understanding.   

## 2017-06-03 ENCOUNTER — Encounter (HOSPITAL_COMMUNITY): Payer: Medicare Other

## 2017-06-04 ENCOUNTER — Encounter: Payer: Self-pay | Admitting: Cardiology

## 2017-06-08 ENCOUNTER — Emergency Department (HOSPITAL_COMMUNITY)
Admission: EM | Admit: 2017-06-08 | Discharge: 2017-06-08 | Disposition: A | Discharge status: Home / Self Care | Payer: Medicare Other | Attending: Emergency Medicine | Admitting: Emergency Medicine

## 2017-06-08 ENCOUNTER — Emergency Department (HOSPITAL_COMMUNITY): Payer: Medicare Other

## 2017-06-08 ENCOUNTER — Other Ambulatory Visit: Payer: Self-pay

## 2017-06-08 ENCOUNTER — Encounter (HOSPITAL_COMMUNITY): Payer: Self-pay

## 2017-06-08 DIAGNOSIS — R52 Pain, unspecified: Secondary | ICD-10-CM

## 2017-06-08 DIAGNOSIS — I110 Hypertensive heart disease with heart failure: Secondary | ICD-10-CM | POA: Insufficient documentation

## 2017-06-08 DIAGNOSIS — M79604 Pain in right leg: Secondary | ICD-10-CM | POA: Diagnosis not present

## 2017-06-08 DIAGNOSIS — I5022 Chronic systolic (congestive) heart failure: Secondary | ICD-10-CM | POA: Diagnosis not present

## 2017-06-08 DIAGNOSIS — Z794 Long term (current) use of insulin: Secondary | ICD-10-CM | POA: Diagnosis not present

## 2017-06-08 DIAGNOSIS — Z79899 Other long term (current) drug therapy: Secondary | ICD-10-CM | POA: Diagnosis not present

## 2017-06-08 DIAGNOSIS — M25551 Pain in right hip: Secondary | ICD-10-CM | POA: Diagnosis present

## 2017-06-08 DIAGNOSIS — W19XXXA Unspecified fall, initial encounter: Secondary | ICD-10-CM

## 2017-06-08 DIAGNOSIS — I2510 Atherosclerotic heart disease of native coronary artery without angina pectoris: Secondary | ICD-10-CM | POA: Diagnosis not present

## 2017-06-08 DIAGNOSIS — Z87891 Personal history of nicotine dependence: Secondary | ICD-10-CM | POA: Insufficient documentation

## 2017-06-08 MED ORDER — DICLOFENAC SODIUM 1 % TD GEL
2.0000 g | Freq: Once | TRANSDERMAL | Status: AC
  Administered 2017-06-08: 2 g via TOPICAL
  Filled 2017-06-08: qty 100

## 2017-06-08 NOTE — ED Provider Notes (Signed)
[facility] EMERGENCY DEPARTMENT Provider Note   CSN: [id number] Arrival date & time: 06/08/17  0840     History   Chief Complaint Chief Complaint  Patient presents with  . Leg Pain    HPI [name] is a 67 y.o. female.  [name] is a 67 y.o. Female with a history of CAD, chronic bronchitis, anemia, hypertension, hyperlipidemia, and arthritis with bilateral hip and right knee replacements, who presents to the emergency department pain in her right hip and upper leg.  She reports she was seen at [facility] last week for sore throat and received a shot of penicillin, and has had some muscle soreness since then, but has continued to be ambulatory, sore throat has competely resolved.  Patient reports yesterday while she was cooking in the [place] she slipped and fell on her right hip and has had worsening pain in the right hip and upper leg since then and wanted to be checked out.  Patient did not hit her head, no loss of consciousness, no midline upper lower back pain, no neck pain.  Patient denies any redness or swelling surrounding the injection site.  No drainage from the area.  Patient denies any numbness or tingling she has been ambulatory without difficulty.  She takes Percocet regularly, has tried Tylenol and Aleve in addition to this but is still having some discomfort has not tried anything else to treat her pain.     Past Medical History:  Diagnosis Date  . Anemia    a. Noted on 07/2012 labs, instructed to f/u PCP.  Marland Kitchen Arthritis    "joints" (11/18/2012)  . CAD (coronary artery disease), native coronary artery    a. Nonobstructive by cath 02/2012 (done because of low EF).  . Chronic bronchitis ([place])    "~ every other year" (11/18/2012)  . Chronic combined systolic and diastolic CHF (congestive heart failure) ([place])    a. 03/05/12 echo:  LVEF 20-25%, moderate LVH , inferior and basal to mid septal akinesis, anterior moderate to severe hypokinesis and grade  2 diastolic dysfunction. b. EF 07/2012: EF still 25% (unclear medication compliance).  . Chronic lower back pain   . Headache(784.0)    "often; maybe not daily" (11/18/2012)  . High cholesterol   . History of noncompliance with medical treatment   . Hypertension   . LBBB (left bundle branch block)   . Orthopnea   . Tobacco abuse   . Type II diabetes mellitus [facility])     Patient Active Problem List   Diagnosis Date Noted  . Atrial fibrillation ([place]) 02/11/2017  . Paroxysmal A-fib ([place])   . Biventricular automatic implantable cardioverter defibrillator in situ   . Sepsis ([place]) 04/20/2016  . UTI (urinary tract infection) 04/20/2016  . Dyspnea 07/19/2015  . Interstitial lung disease ([place]) 11/20/2014  . SIRS (systemic inflammatory response syndrome) ([place]) 02/03/2014  . IDDM (insulin dependent diabetes mellitus) ([place]) 02/03/2014  . Tachycardia 06/14/2013  . Chronic systolic CHF (congestive heart failure) ([place]) 09/29/2012  . Hypoxia 03/06/2012  . Hypertensive heart disease 03/06/2012  . Nonischemic cardiomyopathy ([place]) 03/06/2012  . Tobacco abuse 03/06/2012  . Type 2 diabetes mellitus ([place]) 03/06/2012  . Hypertension 03/06/2012  . CAD (coronary artery disease), native coronary artery 03/06/2012  . Hypokalemia 03/06/2012  . Acute bronchitis 02/01/2009  . Sleep apnea 02/01/2009  . Chest pain 02/01/2009    Past Surgical History:  Procedure Laterality Date  . BI-VENTRICULAR IMPLANTABLE CARDIOVERTER DEFIBRILLATOR N/A 11/18/2012  Procedure: BI-VENTRICULAR IMPLANTABLE CARDIOVERTER DEFIBRILLATOR  (CRT-D);  Surgeon: Evans Lance, MD;  Location: [facility] CATH LAB;  Service: Cardiovascular;  Laterality: N/A;  . BI-VENTRICULAR IMPLANTABLE CARDIOVERTER DEFIBRILLATOR  (CRT-D)  11/18/2012  . CARDIAC CATHETERIZATION  03/04/12   nonobstructive CAD, elevated LVEDP and tortuous vessels suggestive of long-standing hypertension  . COLONOSCOPY WITH PROPOFOL N/A 12/12/2016   Procedure: COLONOSCOPY WITH  PROPOFOL;  Surgeon: Carol Ada, MD;  Location: [facility] ENDOSCOPY;  Service: Endoscopy;  Laterality: N/A;  . JOINT REPLACEMENT     Bilateral hip and right knee  . LEFT HEART CATH N/A 03/05/2012   Procedure: LEFT HEART CATH;  Surgeon: Larey Dresser, MD;  Location: [facility] CATH LAB;  Service: Cardiovascular;  Laterality: N/A;     OB History   None      Home Medications    Prior to Admission medications   Medication Sig Start Date End Date Taking? Authorizing Provider  acetaminophen (TYLENOL) 500 MG tablet Take 1 tablet (500 mg total) by mouth every 6 (six) hours as needed. Patient taking differently: Take 500 mg by mouth every 6 (six) hours as needed for mild pain.  09/15/15   Leo Grosser, MD  albuterol (PROVENTIL HFA;VENTOLIN HFA) 108 (90 Base) MCG/ACT inhaler Inhale 2 puffs into the lungs every 6 (six) hours as needed for wheezing or shortness of breath. 04/25/16   Ghimire, Henreitta Leber, MD  apixaban (ELIQUIS) 5 MG TABS tablet Take 1 tablet (5 mg total) by mouth 2 (two) times daily. 05/01/17   Larey Dresser, MD  atorvastatin (LIPITOR) 40 MG tablet Take 1 tablet (40 mg total) by mouth daily. 05/22/16   Clegg, Amy D, NP  carvedilol (COREG) 25 MG tablet Take 1 tablet (25 mg total) by mouth 2 (two) times daily with a meal. 03/17/17   Tillery, Satira Mccallum, PA-C  furosemide (LASIX) 40 MG tablet Take 1 tablet (40 mg total) by mouth 2 (two) times daily. 05/19/17   Escarlet Friar, PA-C  glucose blood (FREESTYLE TEST STRIPS) test strip Use as instructed 04/25/16   Ghimire, Henreitta Leber, MD  glucose monitoring kit (FREESTYLE) monitoring kit 1 each by Does not apply route 4 (four) times daily - after meals and at bedtime. 1 month Diabetic Testing Supplies for QAC-QHS accuchecks. 04/25/16   Ghimire, Henreitta Leber, MD  insulin aspart (NOVOLOG FLEXPEN) 100 UNIT/ML FlexPen 0-20 Units, Subcutaneous, 3 times daily with meals CBG < 70: implement hypoglycemia protocol CBG 70 - 120: 0 units CBG 121 - 150: 3 units CBG  151 - 200: 4 units CBG 201 - 250: 7 units CBG 251 - 300: 11 units CBG 301 - 350: 15 units CBG 351 - 400: 20 units CBG > 400: call MD 04/25/16   Jonetta Osgood, MD  Insulin Glargine (LANTUS SOLOSTAR) 100 UNIT/ML Solostar Pen Inject 50 Units into the skin daily at 10 pm.    [provider]  Insulin Pen Needle 32G X 8 MM MISC Use as directed 06/18/16   Arbutus Leas, NP  isosorbide-hydrALAZINE (BIDIL) 20-37.5 MG tablet Take 2 tablets by mouth 3 (three) times daily. 09/11/16   Arbutus Leas, NP  Lancets (FREESTYLE) lancets Use as instructed 06/18/16   Arbutus Leas, NP  metFORMIN (GLUCOPHAGE) 1000 MG tablet TAKE 1 TABLET(1000 MG) BY MOUTH TWICE DAILY WITH A MEAL 05/30/16   Adonai Friar, PA-C  naproxen sodium (ALEVE) 220 MG tablet Take 220 mg daily as needed by mouth (pain).    [provider]  Oxycodone HCl 10 MG TABS Take 10 mg by mouth 3 (three) times daily. 12/09/16   [provider]  potassium chloride 20 MEQ TBCR Take 40 mEq by mouth daily. 10/07/16   Arbutus Leas, NP  sacubitril-valsartan (ENTRESTO) 97-103 MG Take 1 tablet by mouth 2 (two) times daily. 06/04/16   Clegg, Amy D, NP  spironolactone (ALDACTONE) 25 MG tablet Take 1 tablet (25 mg total) by mouth daily. 05/22/16   Clegg, Amy D, NP  spironolactone (ALDACTONE) 25 MG tablet TAKE 1 TABLET(25 MG) BY MOUTH DAILY 05/01/17   Bensimhon, Shaune Pascal, MD    Family History Family History  Problem Relation Age of Onset  . Heart disease Neg Hx     Social History Social History   Tobacco Use  . Smoking status: Former Smoker    Packs/day: 1.00    Years: 40.00    Pack years: 40.00    Types: Cigarettes    Start date: 06/23/1972    Last attempt to quit: 03/26/2012    Years since quitting: 5.2  . Smokeless tobacco: Never Used  Substance Use Topics  . Alcohol use: No  . Drug use: No     Allergies   Morphine and related   Review of Systems Review of Systems  Constitutional: Negative for chills and  fever.  HENT: Negative for congestion, rhinorrhea and sore throat.   Respiratory: Negative for shortness of breath.   Cardiovascular: Negative for chest pain.  Gastrointestinal: Negative for abdominal pain.  Genitourinary: Negative for dysuria.  Musculoskeletal: Positive for arthralgias and myalgias. Negative for back pain, gait problem, joint swelling, neck pain and neck stiffness.  Skin: Negative for color change and rash.  Neurological: Negative for headaches.     Physical Exam Updated Vital Signs BP 136/74 (BP Location: Right Arm)   Pulse 94   Temp 98.3 F (36.8 C) (Oral)   Resp 18   SpO2 97%   Physical Exam  Constitutional: She appears well-developed and well-nourished. No distress.  HENT:  Head: Normocephalic and atraumatic.  Eyes: Right eye exhibits no discharge. Left eye exhibits no discharge.  Pulmonary/Chest: Effort normal. No respiratory distress.  Musculoskeletal:  Tenderness to palpation over the right hip and outer right thigh, no appreciable deformity or ecchymosis, small mark from injection last week noted, no surrounding erythema, warmth or swelling, no drainage or fluctuance.  There is mild tenderness over the musculature without any palpable deformity.  Patient has normal range of motion of the hip knee and ankle, 5/5 strength of proximal and distal muscles of the right lower extremity against resistance.  Sensation intact, 2+ DP and TP pulses with good capillary refill. No midline T-spine or L-spine tenderness  Neurological: She is alert. Coordination normal.  Skin: Skin is warm and dry. Capillary refill takes less than 2 seconds. She is not diaphoretic.  Psychiatric: She has a normal mood and affect. Her behavior is normal.  Nursing note and vitals reviewed.    ED Treatments / Results  Labs (all labs ordered are listed, but only abnormal results are displayed) Labs Reviewed - No data to display  EKG None  Radiology Dg Hip Unilat W Or Wo Pelvis 2-3  Views Right  Result Date: 06/08/2017 CLINICAL DATA:  Right posterior hip pain and femur pain for a week. The patient fell yesterday and the pain increased. EXAM: DG HIP (WITH OR WITHOUT PELVIS) 2-3V RIGHT COMPARISON:  None. FINDINGS: There is no evidence of hip fracture or dislocation. No appreciable joint effusion.  Total hip prostheses in place bilaterally. Sclerosis at the symphysis pubis. IMPRESSION: No acute abnormalities. Electronically Signed   By: Lorriane Shire M.D.   On: 06/08/2017 11:37   Dg Femur, Min 2 Views Right  Result Date: 06/08/2017 CLINICAL DATA:  Right femur pain for week. The patient fell yesterday and the pain worsened. EXAM: RIGHT FEMUR 2 VIEWS COMPARISON:  None. FINDINGS: Right total hip prosthesis and right total knee prosthesis in place with no evidence of loosening. Fractures or appreciable joint effusions. IMPRESSION: No acute abnormalities. Electronically Signed   By: Lorriane Shire M.D.   On: 06/08/2017 11:39    Procedures Procedures (including critical care time)  Medications Ordered in ED Medications  diclofenac sodium (VOLTAREN) 1 % transdermal gel 2 g (2 g Topical Given 06/08/17 1231)     Initial Impression / Assessment and Plan / ED Course  I have reviewed the triage vital signs and the nursing notes.  Pertinent labs & imaging results that were available during my care of the patient were reviewed by me and considered in my medical decision making (see chart for details).  Patient presents complaining of right hip and leg pain.  She had a penicillin injection in the right thigh last week and has been having some muscle soreness since then but yesterday slipped and fell and pain has increased since then.  Patient continues to be ambulatory.  No palpable deformities on exam, range of motion is intact in the right lower extremity is neurovascularly intact.  X-rays of the right femur and hip show hip prosthesis hardware is in place and there is no evidence of  fracture, dislocation or effusion.  There is also no soft tissue signs of infection surrounding injection site.  Will treat with Voltaren gel, patient to follow-up with her primary care doctor as well as the orthopedist that did her hip replacement.  Return precautions discussed.  Patient expresses understanding and is in agreement with plan.  Final Clinical Impressions(s) / ED Diagnoses   Final diagnoses:  Right leg pain  Right hip pain  Fall, initial encounter    ED Discharge Orders    None       Janet Berlin 06/08/17 1237    Carmin Muskrat, MD 06/08/17 1536

## 2017-06-08 NOTE — Discharge Instructions (Addendum)
Your x-rays are reassuring, your hardware from hip and knee replacements appears to be in place and there is no evidence of fracture or dislocation.  You may use Voltaren gel over your hip and right leg as needed for pain in addition to your regular home medications.  Please follow-up with your primary care doctor, his pain in your hip persist you will also need to follow-up with your orthopedist who previously did your hip surgery.  Return to the emergency department for redness, swelling, or increasing pain over the hip or leg, fevers, numbness or weakness in the leg, or any other new or concerning symptoms.

## 2017-06-08 NOTE — ED Triage Notes (Signed)
Pt states she was at [facility] for sore throat last week. She states she had a shot of morphine and has been having pain in the leg since. Pt ambulatory.

## 2017-06-12 ENCOUNTER — Telehealth (HOSPITAL_COMMUNITY): Payer: Self-pay

## 2017-06-12 NOTE — Telephone Encounter (Signed)
Pt called today to schedule a CHP visit, pt stated that she still have a box that is partially filled.  She requested that I visit next week.  Both of pt's pill boxes should be empty at this point.  Pt stated that she refilled the box herself.  I will verify next week.

## 2017-06-15 ENCOUNTER — Other Ambulatory Visit (HOSPITAL_COMMUNITY): Payer: Self-pay | Admitting: Adult Health

## 2017-06-15 ENCOUNTER — Encounter (HOSPITAL_COMMUNITY): Payer: Self-pay

## 2017-06-15 ENCOUNTER — Ambulatory Visit (HOSPITAL_COMMUNITY)
Admission: RE | Admit: 2017-06-15 | Discharge: 2017-06-15 | Disposition: A | Discharge status: Home / Self Care | Payer: Medicare Other | Source: Ambulatory Visit | Attending: Internal Medicine | Admitting: Internal Medicine

## 2017-06-15 ENCOUNTER — Encounter: Payer: Self-pay | Admitting: Cardiology

## 2017-06-15 VITALS — BP 184/118 | HR 91 | Wt 225.0 lb

## 2017-06-15 DIAGNOSIS — I5022 Chronic systolic (congestive) heart failure: Secondary | ICD-10-CM | POA: Diagnosis not present

## 2017-06-15 DIAGNOSIS — Z794 Long term (current) use of insulin: Secondary | ICD-10-CM | POA: Diagnosis not present

## 2017-06-15 DIAGNOSIS — Z8249 Family history of ischemic heart disease and other diseases of the circulatory system: Secondary | ICD-10-CM | POA: Insufficient documentation

## 2017-06-15 DIAGNOSIS — I428 Other cardiomyopathies: Secondary | ICD-10-CM

## 2017-06-15 DIAGNOSIS — Z87891 Personal history of nicotine dependence: Secondary | ICD-10-CM | POA: Insufficient documentation

## 2017-06-15 DIAGNOSIS — Z79899 Other long term (current) drug therapy: Secondary | ICD-10-CM | POA: Insufficient documentation

## 2017-06-15 DIAGNOSIS — Z791 Long term (current) use of non-steroidal anti-inflammatories (NSAID): Secondary | ICD-10-CM | POA: Insufficient documentation

## 2017-06-15 DIAGNOSIS — I2510 Atherosclerotic heart disease of native coronary artery without angina pectoris: Secondary | ICD-10-CM | POA: Insufficient documentation

## 2017-06-15 DIAGNOSIS — E119 Type 2 diabetes mellitus without complications: Secondary | ICD-10-CM | POA: Diagnosis not present

## 2017-06-15 DIAGNOSIS — I10 Essential (primary) hypertension: Secondary | ICD-10-CM

## 2017-06-15 DIAGNOSIS — I429 Cardiomyopathy, unspecified: Secondary | ICD-10-CM | POA: Diagnosis not present

## 2017-06-15 DIAGNOSIS — J449 Chronic obstructive pulmonary disease, unspecified: Secondary | ICD-10-CM | POA: Insufficient documentation

## 2017-06-15 DIAGNOSIS — E785 Hyperlipidemia, unspecified: Secondary | ICD-10-CM | POA: Diagnosis not present

## 2017-06-15 DIAGNOSIS — I110 Hypertensive heart disease with heart failure: Secondary | ICD-10-CM | POA: Diagnosis present

## 2017-06-15 DIAGNOSIS — Z79891 Long term (current) use of opiate analgesic: Secondary | ICD-10-CM | POA: Diagnosis not present

## 2017-06-15 DIAGNOSIS — I480 Paroxysmal atrial fibrillation: Secondary | ICD-10-CM | POA: Insufficient documentation

## 2017-06-15 DIAGNOSIS — Z7901 Long term (current) use of anticoagulants: Secondary | ICD-10-CM | POA: Insufficient documentation

## 2017-06-15 NOTE — Progress Notes (Signed)
Patient ID: KEVIN MARIO, female   DOB: December 07, 1950, 67 y.o.   MRN: [id number]   Advanced Heart Failure Clinic Note   PCP: Dr. 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 female with history of nonischemic cardiomyopathy.  She was admitted with CHF exacerbation in 02/2012.  EF 20-25% on echo, LHC with nonobstructive CAD.  She was started on cardiac meds and discharged.  In 07/2012, she was admitted again with CHF exacerbation.  She had run out of Lasix.  She was taking her other heart medications as ordered, however.  She was diuresed and discharged. She had a chronic LBBB, and Medtronic CRT-D device was placed in 10/14.  She was admitted in 6/16 with hypertensive emergency and CHF exacerbation.   Also of note, she had PFTs in 9/16 showing restrictive spirometry with low lung volume and DLCO.  She was supposed to get a high resolution CT to evaluate for interstitial lung disease but never had the study.     Admitted March 2018 with urosepsis--> E Coli Bacteremia. Completed antibiotic course. EF was down from previous to 30-35%. Also had atrial fibrillation so she was loaded on amiodarone. Placed on eliquis. Discharge weight was 208 pounds.  She is now off amiodarone.   Admitted to [facility] 1/16 -> 1/17 with CP and dizziness. Found to be in Afib. Converted spontaneously overnight with BB and diuresis.   Today she returns for HF follow up. Missed her last appointment. Complaining of LLE pain not relieved with OTC and creams. Had a mechanical fall on Sat. Overall feeling fine. Denies SOB/PND/Orthopnea. No bleeding problems. Appetite ok. No fever or chills. Not weight at home. She did not take hr  Taking all medications.   Optivol: No VT No AF. Fluid well below threshold. Activity ~4 hours.   Labs (2/14): SPEP negative, UPEP negative, HIV negative Labs (04/14/2017): K 3.8 Creatinine 0.94   PMH: 1. HTN 2. Type II diabetes 3. Nonischemic cardiomyopathy: ? Due to HTN versus LBBB CMP.  LHC  (2/14) with nonobstructive CAD.  Echo (2/14) with EF 20-25%.  Echo (7/14) with EF 25%, diffuse hypokinesis.  HIV, SPEP, UPEP negative.  Has LBBB. CRT-D 10/2012 (Medtronic).  Echo (4/15) with EF 45-50%, mild diffuse hypokinesis, PA systolic pressure 38 mmHg.  Echo (6/16) with EF 40-45%, mild LVH, septal and inferior hypokinesis.   - Echo (3/18): EF 30-35%. Grade 1 DD - Echo (6/18): EF 45-50%, moderate LVH, normal RV size with mildly decreased systolic function.  4. Chronic LBBB 5. Right TKR 6. Bilateral THR.  7. Hyperlipidemia 8. ?COPD: Has oxygen for use with exertion.  - PFTs (9/16) with FEV1 77%, FVC 76%, ratio 101%, TLC 63%, DLCO 41% => moderate restrictive deficit  9. Atrial fibrillation: Paroxysmal.   SH: Prior smoker, quit 2/14.  Never drank ETOH.  No drugs. Lives with son.   FH: Mother with "heart trouble."   Review of systems complete and found to be negative unless listed in HPI.    Current Outpatient Medications  Medication Sig Dispense Refill  . acetaminophen (TYLENOL) 500 MG tablet Take 1 tablet (500 mg total) by mouth every 6 (six) hours as needed. (Patient taking differently: Take 500 mg by mouth every 6 (six) hours as needed for mild pain. ) 30 tablet 0  . albuterol (PROVENTIL HFA;VENTOLIN HFA) 108 (90 Base) MCG/ACT inhaler Inhale 2 puffs into the lungs every 6 (six) hours as needed for wheezing or shortness of breath. 18 g 0  .  apixaban (ELIQUIS) 5 MG TABS tablet Take 1 tablet (5 mg total) by mouth 2 (two) times daily. 60 tablet 3  . atorvastatin (LIPITOR) 40 MG tablet Take 1 tablet (40 mg total) by mouth daily. 30 tablet 3  . carvedilol (COREG) 25 MG tablet Take 1 tablet (25 mg total) by mouth 2 (two) times daily with a meal. 60 tablet 11  . furosemide (LASIX) 40 MG tablet Take 1 tablet (40 mg total) by mouth 2 (two) times daily. 180 tablet 3  . glucose blood (FREESTYLE TEST STRIPS) test strip Use as instructed 100 each 0  . glucose monitoring kit (FREESTYLE) monitoring kit  1 each by Does not apply route 4 (four) times daily - after meals and at bedtime. 1 month Diabetic Testing Supplies for QAC-QHS accuchecks. 1 each 1  . insulin aspart (NOVOLOG FLEXPEN) 100 UNIT/ML FlexPen 0-20 Units, Subcutaneous, 3 times daily with meals CBG < 70: implement hypoglycemia protocol CBG 70 - 120: 0 units CBG 121 - 150: 3 units CBG 151 - 200: 4 units CBG 201 - 250: 7 units CBG 251 - 300: 11 units CBG 301 - 350: 15 units CBG 351 - 400: 20 units CBG > 400: call MD 15 mL 0  . Insulin Glargine (LANTUS SOLOSTAR) 100 UNIT/ML Solostar Pen Inject 50 Units into the skin daily at 10 pm.    . Insulin Pen Needle 32G X 8 MM MISC Use as directed 100 each 0  . isosorbide-hydrALAZINE (BIDIL) 20-37.5 MG tablet Take 2 tablets by mouth 3 (three) times daily. 180 tablet 6  . Lancets (FREESTYLE) lancets Use as instructed 100 each 0  . metFORMIN (GLUCOPHAGE) 1000 MG tablet TAKE 1 TABLET(1000 MG) BY MOUTH TWICE DAILY WITH A MEAL 60 tablet 0  . naproxen sodium (ALEVE) 220 MG tablet Take 220 mg daily as needed by mouth (pain).    . Oxycodone HCl 10 MG TABS Take 10 mg by mouth 3 (three) times daily.  0  . potassium chloride 20 MEQ TBCR Take 40 mEq by mouth daily. 60 tablet 3  . sacubitril-valsartan (ENTRESTO) 97-103 MG Take 1 tablet by mouth 2 (two) times daily. 60 tablet 6  . spironolactone (ALDACTONE) 25 MG tablet Take 1 tablet (25 mg total) by mouth daily. 30 tablet 3  . spironolactone (ALDACTONE) 25 MG tablet TAKE 1 TABLET(25 MG) BY MOUTH DAILY 30 tablet 3   No current facility-administered medications for this encounter.    Vitals:   06/15/17 0905  BP: (!) 184/118  Pulse: 91  SpO2: 98%  Weight: 225 lb (102.1 kg)    Wt Readings from Last 3 Encounters:  06/15/17 225 lb (102.1 kg)  05/25/17 230 lb (104.3 kg)  05/19/17 227 lb 3.2 oz (103.1 kg)    Physical exam General:  Well appearing. No resp difficulty HEENT: normal Neck: supple. no JVD. Carotids 2+ bilat; no bruits. No lymphadenopathy  or thryomegaly appreciated. Cor: PMI nondisplaced. Regular rate & rhythm. No rubs, gallops or murmurs. Lungs: clear Abdomen: soft, nontender, nondistended. No hepatosplenomegaly. No bruits or masses. Good bowel sounds. Extremities: no cyanosis, clubbing, rash, edema Neuro: alert & orientedx3, cranial nerves grossly intact. moves all 4 extremities w/o difficulty. Affect pleasant  Assessment/Plan: 1. Chronic systolic CHF: Nonischemic cardiomyopathy, HTN. S/P CRT-D (Medtronic). Echo 06/2016 with improved EF 45-50%.  NYHA II. Volume status stable. Continue lasix 40 mg twice a day  - Continue Coreg 25 mg BID.  - Continue Entresto 97/103 mg BID - Continue bidil 2 tabs TID -  Continue spiro 25 mg daily.  - I reviewed lab work from March. Stable renal function.  -   2. Hyperlipidemia:  - Continue statin. No change.   3. HTN:  - Elevated but she did not take medications this morning.  - Reinforced taking all medications prior to visit.   4. PAF: -  - No A fib on optivol.  - Continue Eliquis for anticoagulation.    Follow up in 6 months with an ECHO.  Darrick Grinder, NP  9:14 AM  06/15/2017

## 2017-06-15 NOTE — Patient Instructions (Signed)
No changes to medication at this time.  No lab work today.  Follow up 6 months with Dr. Aundra Dubin and echocardiogram. We will call you closer to this time, or you may call our office to schedule 1 month before you are due to be seen. Take all medication as prescribed the day of your appointment. Bring all medications with you to your appointment.  Do the following things EVERYDAY: 1) Weigh yourself in the morning before breakfast. Write it down and keep it in a log. 2) Take your medicines as prescribed 3) Eat low salt foods-Limit salt (sodium) to 2000 mg per day.  4) Stay as active as you can everyday 5) Limit all fluids for the day to less than 2 liters

## 2017-06-17 ENCOUNTER — Other Ambulatory Visit (HOSPITAL_COMMUNITY): Payer: Self-pay | Admitting: *Deleted

## 2017-06-17 MED ORDER — ATORVASTATIN CALCIUM 40 MG PO TABS: 40.0000 mg | ORAL_TABLET | Freq: Every day | ORAL | 3 refills | Status: DC

## 2017-06-18 ENCOUNTER — Other Ambulatory Visit (HOSPITAL_COMMUNITY): Payer: Self-pay

## 2017-06-18 NOTE — Progress Notes (Signed)
Paramedicine Encounter    Patient ID: [name], female    DOB: [birth year], 67 y.o.   MRN: [id number]    Patient Care Team: [name], MD as PCP - General (Family Medicine)  Patient Active Problem List   Diagnosis Date Noted  . Atrial fibrillation ([place]) 02/11/2017  . Paroxysmal A-fib ([place])   . Biventricular automatic implantable cardioverter defibrillator in situ   . Sepsis ([place]) 04/20/2016  . UTI (urinary tract infection) 04/20/2016  . Dyspnea 07/19/2015  . Interstitial lung disease ([place]) 11/20/2014  . SIRS (systemic inflammatory response syndrome) ([place]) 02/03/2014  . IDDM (insulin dependent diabetes mellitus) ([place]) 02/03/2014  . Tachycardia 06/14/2013  . Chronic systolic CHF (congestive heart failure) ([place]) 09/29/2012  . Hypoxia 03/06/2012  . Hypertensive heart disease 03/06/2012  . Nonischemic cardiomyopathy ([place]) 03/06/2012  . Tobacco abuse 03/06/2012  . Type 2 diabetes mellitus ([place]) 03/06/2012  . Hypertension 03/06/2012  . CAD (coronary artery disease), native coronary artery 03/06/2012  . Hypokalemia 03/06/2012  . Acute bronchitis 02/01/2009  . Sleep apnea 02/01/2009  . Chest pain 02/01/2009    Current Outpatient Medications:  .  apixaban (ELIQUIS) 5 MG TABS tablet, Take 1 tablet (5 mg total) by mouth 2 (two) times daily., Disp: 60 tablet, Rfl: 3 .  atorvastatin (LIPITOR) 40 MG tablet, Take 1 tablet (40 mg total) by mouth daily., Disp: 30 tablet, Rfl: 3 .  carvedilol (COREG) 25 MG tablet, Take 1 tablet (25 mg total) by mouth 2 (two) times daily with a meal., Disp: 60 tablet, Rfl: 11 .  ENTRESTO 97-103 MG, TAKE 1 TABLET BY MOUTH TWICE DAILY, Disp: 60 tablet, Rfl: 6 .  furosemide (LASIX) 40 MG tablet, Take 1 tablet (40 mg total) by mouth 2 (two) times daily., Disp: 180 tablet, Rfl: 3 .  isosorbide-hydrALAZINE (BIDIL) 20-37.5 MG tablet, Take 2 tablets by mouth 3 (three) times daily., Disp: 180 tablet, Rfl: 6 .  metFORMIN (GLUCOPHAGE) 1000 MG tablet, TAKE 1  TABLET(1000 MG) BY MOUTH TWICE DAILY WITH A MEAL, Disp: 60 tablet, Rfl: 0 .  potassium chloride 20 MEQ TBCR, Take 40 mEq by mouth daily., Disp: 60 tablet, Rfl: 3 .  spironolactone (ALDACTONE) 25 MG tablet, TAKE 1 TABLET(25 MG) BY MOUTH DAILY, Disp: 30 tablet, Rfl: 3 .  acetaminophen (TYLENOL) 500 MG tablet, Take 1 tablet (500 mg total) by mouth every 6 (six) hours as needed. (Patient taking differently: Take 500 mg by mouth every 6 (six) hours as needed for mild pain. ), Disp: 30 tablet, Rfl: 0 .  albuterol (PROVENTIL HFA;VENTOLIN HFA) 108 (90 Base) MCG/ACT inhaler, Inhale 2 puffs into the lungs every 6 (six) hours as needed for wheezing or shortness of breath., Disp: 18 g, Rfl: 0 .  glucose blood (FREESTYLE TEST STRIPS) test strip, Use as instructed, Disp: 100 each, Rfl: 0 .  glucose monitoring kit (FREESTYLE) monitoring kit, 1 each by Does not apply route 4 (four) times daily - after meals and at bedtime. 1 month Diabetic Testing Supplies for QAC-QHS accuchecks., Disp: 1 each, Rfl: 1 .  insulin aspart (NOVOLOG FLEXPEN) 100 UNIT/ML FlexPen, 0-20 Units, Subcutaneous, 3 times daily with meals CBG < 70: implement hypoglycemia protocol CBG 70 - 120: 0 units CBG 121 - 150: 3 units CBG 151 - 200: 4 units CBG 201 - 250: 7 units CBG 251 - 300: 11 units CBG 301 - 350: 15 units CBG 351 - 400: 20 units CBG > 400: call MD, Disp: 15 mL, Rfl: 0 .  Insulin  Glargine (LANTUS SOLOSTAR) 100 UNIT/ML Solostar Pen, Inject 50 Units into the skin daily at 10 pm., Disp: , Rfl:  .  Insulin Pen Needle 32G X 8 MM MISC, Use as directed, Disp: 100 each, Rfl: 0 .  Lancets (FREESTYLE) lancets, Use as instructed, Disp: 100 each, Rfl: 0 .  naproxen sodium (ALEVE) 220 MG tablet, Take 220 mg daily as needed by mouth (pain)., Disp: , Rfl:  .  Oxycodone HCl 10 MG TABS, Take 10 mg by mouth 3 (three) times daily., Disp: , Rfl: 0 .  spironolactone (ALDACTONE) 25 MG tablet, Take 1 tablet (25 mg total) by mouth daily., Disp: 30 tablet, Rfl:  3 Allergies  Allergen Reactions  . Morphine And Related Nausea And Vomiting and Other (See Comments)    Severe nausea     Social History   Socioeconomic History  . Marital status: Single    Spouse name: Not on file  . Number of children: Not on file  . Years of education: Not on file  . Highest education level: Not on file  Occupational History  . Not on file  Social Needs  . Financial resource strain: Not on file  . Food insecurity:    Worry: Not on file    Inability: Not on file  . Transportation needs:    Medical: Not on file    Non-medical: Not on file  Tobacco Use  . Smoking status: Former Smoker    Packs/day: 1.00    Years: 40.00    Pack years: 40.00    Types: Cigarettes    Start date: 06/23/1972    Last attempt to quit: 03/26/2012    Years since quitting: 5.2  . Smokeless tobacco: Never Used  Substance and Sexual Activity  . Alcohol use: No  . Drug use: No  . Sexual activity: Yes  Lifestyle  . Physical activity:    Days per week: Not on file    Minutes per session: Not on file  . Stress: Not on file  Relationships  . Social connections:    Talks on phone: Not on file    Gets together: Not on file    Attends religious service: Not on file    Active member of club or organization: Not on file    Attends meetings of clubs or organizations: Not on file    Relationship status: Not on file  . Intimate partner violence:    Fear of current or ex partner: Not on file    Emotionally abused: Not on file    Physically abused: Not on file    Forced sexual activity: Not on file  Other Topics Concern  . Not on file  Social History Narrative  . Not on file    Physical Exam  Pulmonary/Chest: Effort normal. No respiratory distress.  Abdominal: Soft.  Musculoskeletal: She exhibits no edema.  Skin: Skin is warm and dry. She is not diaphoretic.        No future appointments.  ATF pt CAO x4 sitting on the edge of her bed c/o knee pain. Pt stated that she  went to her physician yesterday and he wouldn't prescribe her additional pain meds.  Pt ran out of her meds about 2 weeks sooner that she should have.  Pt hasn't taken any other pain meds over the counter.  Pt stated that she had refilled her own pill box several times when I called to schedule CHP visits.  Today's visit, pt has too many pills in box  her pill box and pill bottles.  Pt has two bidil pill bottles almost full, and other meds that should've needed a refill by now.  Pt still states that she has taken all of her meds.  Pt denies sob, chest pain and dizziness.  2 pill boxes refilled.   BP 122/80 (BP Location: Right Arm, Patient Position: Sitting, Cuff Size: Large)   Pulse 84   Resp 16   Wt 217 lb 12.8 oz (98.8 kg)   SpO2 97%   BMI 34.11 kg/m   Weight yesterday-didn't weigh Last visit weight-227  **pt is still out of: Spirolactone Potassium metformin  Jerene Yeager, EMT Paramedic 06/19/2017    ACTION: Home visit completed

## 2017-06-28 ENCOUNTER — Emergency Department (HOSPITAL_COMMUNITY)
Admission: EM | Admit: 2017-06-28 | Discharge: 2017-06-28 | Disposition: A | Discharge status: Home / Self Care | Payer: Medicare Other | Attending: Emergency Medicine | Admitting: Emergency Medicine

## 2017-06-28 ENCOUNTER — Encounter (HOSPITAL_COMMUNITY): Payer: Self-pay | Admitting: Emergency Medicine

## 2017-06-28 ENCOUNTER — Other Ambulatory Visit: Payer: Self-pay

## 2017-06-28 ENCOUNTER — Emergency Department (HOSPITAL_COMMUNITY): Payer: Medicare Other

## 2017-06-28 DIAGNOSIS — Z794 Long term (current) use of insulin: Secondary | ICD-10-CM | POA: Insufficient documentation

## 2017-06-28 DIAGNOSIS — I480 Paroxysmal atrial fibrillation: Secondary | ICD-10-CM | POA: Insufficient documentation

## 2017-06-28 DIAGNOSIS — E119 Type 2 diabetes mellitus without complications: Secondary | ICD-10-CM | POA: Insufficient documentation

## 2017-06-28 DIAGNOSIS — I2510 Atherosclerotic heart disease of native coronary artery without angina pectoris: Secondary | ICD-10-CM | POA: Diagnosis not present

## 2017-06-28 DIAGNOSIS — I5042 Chronic combined systolic (congestive) and diastolic (congestive) heart failure: Secondary | ICD-10-CM | POA: Diagnosis not present

## 2017-06-28 DIAGNOSIS — Z7901 Long term (current) use of anticoagulants: Secondary | ICD-10-CM | POA: Insufficient documentation

## 2017-06-28 DIAGNOSIS — Z87891 Personal history of nicotine dependence: Secondary | ICD-10-CM | POA: Insufficient documentation

## 2017-06-28 DIAGNOSIS — M79604 Pain in right leg: Secondary | ICD-10-CM | POA: Diagnosis present

## 2017-06-28 DIAGNOSIS — I110 Hypertensive heart disease with heart failure: Secondary | ICD-10-CM | POA: Insufficient documentation

## 2017-06-28 DIAGNOSIS — Z79899 Other long term (current) drug therapy: Secondary | ICD-10-CM | POA: Diagnosis not present

## 2017-06-28 DIAGNOSIS — E7800 Pure hypercholesterolemia, unspecified: Secondary | ICD-10-CM | POA: Diagnosis not present

## 2017-06-28 MED ORDER — OXYCODONE-ACETAMINOPHEN 5-325 MG PO TABS
1.0000 | ORAL_TABLET | ORAL | Status: DC | PRN
  Administered 2017-06-28: 1 via ORAL
  Filled 2017-06-28: qty 1

## 2017-06-28 MED ORDER — METHOCARBAMOL 500 MG PO TABS: 500.0000 mg | ORAL_TABLET | Freq: Three times a day (TID) | ORAL | 0 refills | Status: DC | PRN

## 2017-06-28 MED ORDER — LIDOCAINE 5 % EX PTCH: 1.0000 | MEDICATED_PATCH | CUTANEOUS | 0 refills | Status: DC

## 2017-06-28 NOTE — ED Provider Notes (Signed)
[facility] EMERGENCY DEPARTMENT Provider Note   CSN: [id number] Arrival date & time: 06/28/17  0810     History   Chief Complaint Chief Complaint  Patient presents with  . Leg Pain    HPI [name] is a 67 y.o. 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 Patient states she has chronic R sided back/hip pain which she receives oxycodone for from PCP. She states that she was seen at [facility] ED 04/29- was given IM PCN in the R thigh for strep throat and she has had RLE pain ever since. She did have a fall onto the R side in the beginning of May that exacerbated this. She was subsequently seen in the ED for pain 05/13- had thorough exam with work-up including x-rays of the R hip/pelvis and R femur which were negative for acute abnormality and showed intact hardware. Patient discharged home with orthopedics and PCP follow up with voltaren gel. She states she has seen each of these providers, no change in treatment or additional evaluations. She states that she has had constant pain to the R hip down to the R lower leg. At present most prominent areas of pain are the R knee and R proximal lower leg. States pain is severe, worse with ambulation, has decreased ambulation secondary to pain. She does walk with a walker/cane at home. She states her prescribed oxycodone helps somewhat. Has also tried tylenol and voltaren gel without change. No subsequent injuries. She has not noted any redness/warmth to any areas. She denies swelling. Denies fevers. Denies recent surgery/trauma, recent long travel, hormone use, personal hx of cancer, or hx of DVT/PE. She has baseline paresthesias to feet which she attributes to her diabetes, no change in baseline.    HPI  Past Medical History:  Diagnosis Date  . Anemia    a. Noted on 07/2012 labs, instructed to f/u  PCP.  Marland Kitchen Arthritis    "joints" (11/18/2012)  . CAD (coronary artery disease), native coronary artery    a. Nonobstructive by cath 02/2012 (done because of low EF).  . Chronic bronchitis ([place])    "~ every other year" (11/18/2012)  . Chronic combined systolic and diastolic CHF (congestive heart failure) ([place])    a. 03/05/12 echo:  LVEF 20-25%, moderate LVH , inferior and basal to mid septal akinesis, anterior moderate to severe hypokinesis and grade 2 diastolic dysfunction. b. EF 07/2012: EF still 25% (unclear medication compliance).  . Chronic lower back pain   . Headache(784.0)    "often; maybe not daily" (11/18/2012)  . High cholesterol   . History of noncompliance with medical treatment   . Hypertension   . LBBB (left bundle branch block)   . Orthopnea   . Tobacco abuse   . Type II diabetes mellitus [facility])     Patient Active Problem List   Diagnosis Date Noted  . Atrial fibrillation ([place]) 02/11/2017  . Paroxysmal A-fib ([place])   . Biventricular automatic implantable cardioverter defibrillator in situ   . Sepsis ([place]) 04/20/2016  . UTI (urinary tract infection) 04/20/2016  . Dyspnea 07/19/2015  . Interstitial lung disease ([place]) 11/20/2014  . SIRS (systemic inflammatory response syndrome) ([place]) 02/03/2014  . IDDM (insulin dependent diabetes mellitus) ([place]) 02/03/2014  . Tachycardia 06/14/2013  . Chronic systolic CHF (congestive heart failure) ([place]) 09/29/2012  . Hypoxia 03/06/2012  .  Hypertensive heart disease 03/06/2012  . Nonischemic cardiomyopathy ([place]) 03/06/2012  . Tobacco abuse 03/06/2012  . Type 2 diabetes mellitus ([place]) 03/06/2012  . Hypertension 03/06/2012  . CAD (coronary artery disease), native coronary artery 03/06/2012  . Hypokalemia 03/06/2012  . Acute bronchitis 02/01/2009  . Sleep apnea 02/01/2009  . Chest pain 02/01/2009    Past Surgical History:  Procedure Laterality Date  . BI-VENTRICULAR IMPLANTABLE CARDIOVERTER DEFIBRILLATOR N/A 11/18/2012   Procedure:  BI-VENTRICULAR IMPLANTABLE CARDIOVERTER DEFIBRILLATOR  (CRT-D);  Surgeon: Evans Lance, MD;  Location: [facility] CATH LAB;  Service: Cardiovascular;  Laterality: N/A;  . BI-VENTRICULAR IMPLANTABLE CARDIOVERTER DEFIBRILLATOR  (CRT-D)  11/18/2012  . CARDIAC CATHETERIZATION  03/04/12   nonobstructive CAD, elevated LVEDP and tortuous vessels suggestive of long-standing hypertension  . COLONOSCOPY WITH PROPOFOL N/A 12/12/2016   Procedure: COLONOSCOPY WITH PROPOFOL;  Surgeon: Carol Ada, MD;  Location: [facility] ENDOSCOPY;  Service: Endoscopy;  Laterality: N/A;  . JOINT REPLACEMENT     Bilateral hip and right knee  . LEFT HEART CATH N/A 03/05/2012   Procedure: LEFT HEART CATH;  Surgeon: Larey Dresser, MD;  Location: [facility] CATH LAB;  Service: Cardiovascular;  Laterality: N/A;     OB History   None      Home Medications    Prior to Admission medications   Medication Sig Start Date End Date Taking? Authorizing Provider  acetaminophen (TYLENOL) 500 MG tablet Take 1 tablet (500 mg total) by mouth every 6 (six) hours as needed. Patient taking differently: Take 500 mg by mouth every 6 (six) hours as needed for mild pain.  09/15/15   Leo Grosser, MD  albuterol (PROVENTIL HFA;VENTOLIN HFA) 108 (90 Base) MCG/ACT inhaler Inhale 2 puffs into the lungs every 6 (six) hours as needed for wheezing or shortness of breath. 04/25/16   Ghimire, Henreitta Leber, MD  apixaban (ELIQUIS) 5 MG TABS tablet Take 1 tablet (5 mg total) by mouth 2 (two) times daily. 05/01/17   Larey Dresser, MD  atorvastatin (LIPITOR) 40 MG tablet Take 1 tablet (40 mg total) by mouth daily. 06/17/17   Larey Dresser, MD  carvedilol (COREG) 25 MG tablet Take 1 tablet (25 mg total) by mouth 2 (two) times daily with a meal. 03/17/17   Shantese Friar, PA-C  ENTRESTO 97-103 MG TAKE 1 TABLET BY MOUTH TWICE DAILY 06/17/17   Bensimhon, Shaune Pascal, MD  furosemide (LASIX) 40 MG tablet Take 1 tablet (40 mg total) by mouth 2 (two) times daily. 05/19/17   Avanna Friar, PA-C  glucose blood (FREESTYLE TEST STRIPS) test strip Use as instructed 04/25/16   Ghimire, Henreitta Leber, MD  glucose monitoring kit (FREESTYLE) monitoring kit 1 each by Does not apply route 4 (four) times daily - after meals and at bedtime. 1 month Diabetic Testing Supplies for QAC-QHS accuchecks. 04/25/16   Ghimire, Henreitta Leber, MD  insulin aspart (NOVOLOG FLEXPEN) 100 UNIT/ML FlexPen 0-20 Units, Subcutaneous, 3 times daily with meals CBG < 70: implement hypoglycemia protocol CBG 70 - 120: 0 units CBG 121 - 150: 3 units CBG 151 - 200: 4 units CBG 201 - 250: 7 units CBG 251 - 300: 11 units CBG 301 - 350: 15 units CBG 351 - 400: 20 units CBG > 400: call MD 04/25/16   Jonetta Osgood, MD  Insulin Glargine (LANTUS SOLOSTAR) 100 UNIT/ML Solostar Pen Inject 50 Units into the skin daily at 10 pm.    [provider]  Insulin Pen Needle 32G X 8 MM  MISC Use as directed 06/18/16   Arbutus Leas, NP  isosorbide-hydrALAZINE (BIDIL) 20-37.5 MG tablet Take 2 tablets by mouth 3 (three) times daily. 09/11/16   Arbutus Leas, NP  Lancets (FREESTYLE) lancets Use as instructed 06/18/16   Arbutus Leas, NP  metFORMIN (GLUCOPHAGE) 1000 MG tablet TAKE 1 TABLET(1000 MG) BY MOUTH TWICE DAILY WITH A MEAL 05/30/16   Lemya Friar, PA-C  naproxen sodium (ALEVE) 220 MG tablet Take 220 mg daily as needed by mouth (pain).    [provider]  Oxycodone HCl 10 MG TABS Take 10 mg by mouth 3 (three) times daily. 12/09/16   [provider]  potassium chloride 20 MEQ TBCR Take 40 mEq by mouth daily. 10/07/16   Arbutus Leas, NP  spironolactone (ALDACTONE) 25 MG tablet Take 1 tablet (25 mg total) by mouth daily. 05/22/16   Clegg, Amy D, NP  spironolactone (ALDACTONE) 25 MG tablet TAKE 1 TABLET(25 MG) BY MOUTH DAILY 05/01/17   Bensimhon, Shaune Pascal, MD    Family History Family History  Problem Relation Age of Onset  . Heart disease Neg Hx     Social History Social History   Tobacco  Use  . Smoking status: Former Smoker    Packs/day: 1.00    Years: 40.00    Pack years: 40.00    Types: Cigarettes    Start date: 06/23/1972    Last attempt to quit: 03/26/2012    Years since quitting: 5.2  . Smokeless tobacco: Never Used  Substance Use Topics  . Alcohol use: No  . Drug use: No     Allergies   Morphine and related   Review of Systems Review of Systems  Constitutional: Negative for chills and fever.  Cardiovascular: Negative for leg swelling.  Musculoskeletal: Positive for arthralgias, back pain and myalgias. Negative for joint swelling.  Neurological: Negative for weakness and numbness.       Positive for baseline paresthesias to feet, unchanged    Physical Exam Updated Vital Signs BP (!) 150/74 (BP Location: Right Arm)   Pulse 81   Temp 99.2 F (37.3 C) (Oral)   Resp 18   SpO2 100%   Physical Exam  Constitutional: She appears well-developed and well-nourished. No distress.  HENT:  Head: Normocephalic and atraumatic.  Eyes: Conjunctivae are normal. Right eye exhibits no discharge. Left eye exhibits no discharge.  Cardiovascular: Normal rate and regular rhythm.  Pulses:      Dorsalis pedis pulses are 2+ on the right side, and 2+ on the left side.       Posterior tibial pulses are 2+ on the right side, and 2+ on the left side.  Pulmonary/Chest: Effort normal and breath sounds normal. No respiratory distress.  Musculoskeletal:  No Obvious deformity, appreciable swelling, erythema, ecchymosis, open wounds, or overlying warmth.  No edema. Back: No midline tenderness to palpation, patient does have right lumbar paraspinal muscle tenderness to palpation which extends to the gluteal region. Lower extremities: Bilateral hip ROM intact.  Normal range of motion to left knee and bilateral ankles.  Patient has some limitation in right knee flexion secondary to pain, however she is able to flex to 90 degrees and extend fully.  Patient is diffusely tender to the  right lower extremity including the right thigh, knee, and proximal lower leg.  Compartments are soft.  She is neurovascularly intact distally.  Neurological: She is alert.  Clear speech.  Sensation grossly intact to bilateral lower extremities.  Patient has  5 out of 5 symmetric strength with plantar dorsiflexion bilaterally.  She is able to ambulate with a cane, gait is antalgic.   Skin: Skin is dry. Capillary refill takes less than 2 seconds.  Psychiatric: She has a normal mood and affect. Her behavior is normal. Thought content normal.  Nursing note and vitals reviewed.    ED Treatments / Results  Labs (all labs ordered are listed, but only abnormal results are displayed) Labs Reviewed - No data to display  EKG None  Radiology Dg Tibia/fibula Right  Result Date: 06/28/2017 CLINICAL DATA:  RIGHT leg pain. EXAM: RIGHT TIBIA AND FIBULA - 2 VIEW COMPARISON:  None. FINDINGS: RIGHT knee arthroplasty hardware appears intact and appropriately positioned. No evidence of loosening at the tibial plateau. Remainder of the tibia appears normal. Fibula appears normal. Adjacent soft tissues are unremarkable. IMPRESSION: No acute findings. RIGHT knee arthroplasty hardware appears intact and appropriately positioned. Electronically Signed   By: Franki Cabot M.D.   On: 06/28/2017 10:00   Dg Knee Complete 4 Views Right  Result Date: 06/28/2017 CLINICAL DATA:  RIGHT leg pain EXAM: RIGHT KNEE - COMPLETE 4+ VIEW COMPARISON:  None. FINDINGS: RIGHT total knee arthroplasty hardware appears intact and appropriately positioned. No evidence of loosening. No acute or suspicious osseous finding. No convincing joint effusion. Superficial soft tissues are unremarkable. IMPRESSION: 1. No acute findings. 2. RIGHT knee arthroplasty hardware appears intact and appropriately positioned. No evidence of surgical complicating feature. Electronically Signed   By: Franki Cabot M.D.   On: 06/28/2017 09:59     Procedures Procedures (including critical care time)  Medications Ordered in ED Medications  oxyCODONE-acetaminophen (PERCOCET/ROXICET) 5-325 MG per tablet 1 tablet (1 tablet Oral Given 06/28/17 3662)     Initial Impression / Assessment and Plan / ED Course  I have reviewed the triage vital signs and the nursing notes.  Pertinent labs & imaging results that were available during my care of the patient were reviewed by me and considered in my medical decision making (see chart for details).   Patient presents with continued right lower extremity pain after IM penicillin injection and mechanical fall in the past 1 month.  She has been evaluated in the emergency department, by her orthopedist, and by her primary care provider for this complaint within the past 1 month.  She returns today for continued pain, has not had additional injury since her last ER visit.  She is without systemic symptoms, she is afebrile, there is no overlying erythema or warmth, do not suspect infectious etiology such as cellulitis or septic joint.  No edema, low risk [place], doubt DVT at this time.  X-rays of right hip, pelvis, and right femur are reviewed from previous visit, negative, hardware intact.  Patient is having more pain in the right knee and right lower leg x-rays obtained at this visit in these areas, negative for fracture dislocation, hardware is intact.  Patient is neurovascularly intact distally.  It is unclear what the definitive etiology of her pain is. Will treat with robaxin and lidoderm patches- instructed patient she should not take oxycodone when taking robaxin, and that she should not drive or operate heavy machinery when taking this medication. I discussed results, treatment plan, need for PCP and orthopedics follow-up, and return precautions with the patient. Provided opportunity for questions, patient confirmed understanding and is in agreement with plan.    Final Clinical Impressions(s) / ED  Diagnoses   Final diagnoses:  Right leg pain  ED Discharge Orders        Ordered    lidocaine (LIDODERM) 5 %  Every 24 hours     06/28/17 1025    methocarbamol (ROBAXIN) 500 MG tablet  Every 8 hours PRN     06/28/17 1025       Burnie Hank, [place] R, PA-C 06/28/17 1540    Orlie Dakin, MD 06/28/17 1621

## 2017-06-28 NOTE — ED Notes (Signed)
Patient is alert and orientedx4.  Patient was explained discharge instructions and they understood them with no questions.  The patient's son, Virgina Evener is taking the patient home.

## 2017-06-28 NOTE — Discharge Instructions (Addendum)
You are seen in the emergency department today for pain in your right leg.  The x-ray of your knee and your lower leg did not show fractures or dislocations, it showed that your hardware from her knee surgery is intact.  We are unsure of the exact cause of your pain, it is possible that is related to muscle spasms or the involvement of the nerve.  We are treating this with 2 medications: - Lidoderm patches- these are topical patches you may place in the lower back/buttocks area or in areas of pain to help soothe them. If overly expensive please ask the pharmacist for the over the counter version.  - Robaxin- Robaxin is the muscle relaxer I have prescribed, this is meant to help with muscle tightness. Be aware that this medication may make you drowsy therefore the first time you take this it should be at a time you are in an environment where you can rest. Do not drive or operate heavy machinery when taking this medication. Do not take oxycodone or drink alcohol with this medication as it can be sedating.   We have prescribed you new medication(s) today. Discuss the medications prescribed today with your pharmacist as they can have adverse effects and interactions with your other medicines including over the counter and prescribed medications. Seek medical evaluation if you start to experience new or abnormal symptoms after taking one of these medicines, seek care immediately if you start to experience difficulty breathing, feeling of your throat closing, facial swelling, or rash as these could be indications of a more serious allergic reaction  Follow-up with your primary care doctor, your orthopedic surgeon Dr.Rowan, or the alternative orthopedic doctor provided in your discharge instructions. Return to the Er for new or worsening symptoms including, but not limited to fever, redness, swelling, or any other concerns.

## 2017-06-28 NOTE — ED Triage Notes (Signed)
EMS report Patient complains of right leg pain x1 month. Seen at [facility] recently for same, EMS reports diagnostic imaging did not show injury. Receive an injection at [facility], denies any relief.  155/86 with history of hypertension, did not take antihypertensive meds today CBG 280 HR 90 100% SpO2

## 2017-06-28 NOTE — ED Triage Notes (Signed)
Patient complains of right leg pain that starts in her right hip and radiates down. Patient states the pain makes it difficult to walk. Denies recent injury.

## 2017-07-12 ENCOUNTER — Emergency Department (HOSPITAL_COMMUNITY)
Admission: EM | Admit: 2017-07-12 | Discharge: 2017-07-12 | Disposition: A | Discharge status: Home / Self Care | Payer: Medicare Other | Attending: Emergency Medicine | Admitting: Emergency Medicine

## 2017-07-12 ENCOUNTER — Encounter (HOSPITAL_COMMUNITY): Payer: Self-pay

## 2017-07-12 ENCOUNTER — Other Ambulatory Visit: Payer: Self-pay

## 2017-07-12 ENCOUNTER — Emergency Department (HOSPITAL_COMMUNITY): Payer: Medicare Other

## 2017-07-12 DIAGNOSIS — Z87891 Personal history of nicotine dependence: Secondary | ICD-10-CM | POA: Diagnosis not present

## 2017-07-12 DIAGNOSIS — M25551 Pain in right hip: Secondary | ICD-10-CM | POA: Insufficient documentation

## 2017-07-12 DIAGNOSIS — Z79899 Other long term (current) drug therapy: Secondary | ICD-10-CM | POA: Insufficient documentation

## 2017-07-12 DIAGNOSIS — Z96643 Presence of artificial hip joint, bilateral: Secondary | ICD-10-CM | POA: Diagnosis not present

## 2017-07-12 DIAGNOSIS — I110 Hypertensive heart disease with heart failure: Secondary | ICD-10-CM | POA: Insufficient documentation

## 2017-07-12 DIAGNOSIS — I5022 Chronic systolic (congestive) heart failure: Secondary | ICD-10-CM | POA: Diagnosis not present

## 2017-07-12 DIAGNOSIS — Z96651 Presence of right artificial knee joint: Secondary | ICD-10-CM | POA: Insufficient documentation

## 2017-07-12 DIAGNOSIS — I2510 Atherosclerotic heart disease of native coronary artery without angina pectoris: Secondary | ICD-10-CM | POA: Insufficient documentation

## 2017-07-12 DIAGNOSIS — E119 Type 2 diabetes mellitus without complications: Secondary | ICD-10-CM | POA: Diagnosis not present

## 2017-07-12 DIAGNOSIS — Z794 Long term (current) use of insulin: Secondary | ICD-10-CM | POA: Insufficient documentation

## 2017-07-12 MED ORDER — LORAZEPAM 2 MG/ML IJ SOLN
1.0000 mg | Freq: Once | INTRAMUSCULAR | Status: AC
  Administered 2017-07-12: 1 mg via INTRAVENOUS
  Filled 2017-07-12: qty 1

## 2017-07-12 MED ORDER — DEXAMETHASONE SODIUM PHOSPHATE 10 MG/ML IJ SOLN
10.0000 mg | Freq: Once | INTRAMUSCULAR | Status: AC
  Administered 2017-07-12: 10 mg via INTRAVENOUS
  Filled 2017-07-12: qty 1

## 2017-07-12 MED ORDER — FENTANYL CITRATE (PF) 100 MCG/2ML IJ SOLN
100.0000 ug | Freq: Once | INTRAMUSCULAR | Status: AC
  Administered 2017-07-12: 100 ug via INTRAVENOUS
  Filled 2017-07-12: qty 2

## 2017-07-12 MED ORDER — KETOROLAC TROMETHAMINE 30 MG/ML IJ SOLN
15.0000 mg | Freq: Once | INTRAMUSCULAR | Status: AC
  Administered 2017-07-12: 15 mg via INTRAVENOUS
  Filled 2017-07-12: qty 1

## 2017-07-12 NOTE — Discharge Instructions (Addendum)
Follow-up next week with your pain management specialist

## 2017-07-12 NOTE — ED Notes (Signed)
Bed: NG29 Expected date: 07/12/17 Expected time: 9:33 AM Means of arrival: Ambulance Comments: Hyperglycemia, chronic pain

## 2017-07-12 NOTE — ED Triage Notes (Signed)
EMS reports from home right hip x 2 months. Seen multiple times for same pain origin undiagnosed. Bilateral hip replacement and right knee replacement. Hx of diabetes states she has not taken insulin or hypertension medication today.98.4  BP 198/93 HR 91 Resp 16 Sp02 96 RA CBG 332  22ga L forearm  170ml NS enroute

## 2017-07-12 NOTE — ED Provider Notes (Signed)
[facility] DEPT Provider Note   CSN: [id number] Arrival date & time: 07/12/17  0946     History   Chief Complaint Chief Complaint  Patient presents with  . Leg Pain    HPI [name] is a 67 y.o. female.  67 year old female presents with worsening chronic right hip pain times several months.  Does go to a pain clinic and is run out of her medications.  According to the [facility] her last refill was on May 8 for 1 month supply.  Patient states that today the pain became so severe that she fell onto her carpet.  Denies any head or neck trauma from the fall.  Her pain is characterizes sharp and worse with any movement and starting in her right hip and radiating down to her right foot.  No bowel or bladder dysfunction.  No perineal numbness.  Denies dragging her foot when she walks.  Symptoms better with remaining still.     Past Medical History:  Diagnosis Date  . Anemia    a. Noted on 07/2012 labs, instructed to f/u PCP.  Marland Kitchen Arthritis    "joints" (11/18/2012)  . CAD (coronary artery disease), native coronary artery    a. Nonobstructive by cath 02/2012 (done because of low EF).  . Chronic bronchitis ([place])    "~ every other year" (11/18/2012)  . Chronic combined systolic and diastolic CHF (congestive heart failure) ([place])    a. 03/05/12 echo:  LVEF 20-25%, moderate LVH , inferior and basal to mid septal akinesis, anterior moderate to severe hypokinesis and grade 2 diastolic dysfunction. b. EF 07/2012: EF still 25% (unclear medication compliance).  . Chronic lower back pain   . Headache(784.0)    "often; maybe not daily" (11/18/2012)  . High cholesterol   . History of noncompliance with medical treatment   . Hypertension   . LBBB (left bundle branch block)   . Orthopnea   . Tobacco abuse   . Type II diabetes mellitus [facility])     Patient Active Problem List   Diagnosis Date Noted  . Atrial fibrillation ([place]) 02/11/2017  . Paroxysmal A-fib ([place])    . Biventricular automatic implantable cardioverter defibrillator in situ   . Sepsis ([place]) 04/20/2016  . UTI (urinary tract infection) 04/20/2016  . Dyspnea 07/19/2015  . Interstitial lung disease ([place]) 11/20/2014  . SIRS (systemic inflammatory response syndrome) ([place]) 02/03/2014  . IDDM (insulin dependent diabetes mellitus) ([place]) 02/03/2014  . Tachycardia 06/14/2013  . Chronic systolic CHF (congestive heart failure) ([place]) 09/29/2012  . Hypoxia 03/06/2012  . Hypertensive heart disease 03/06/2012  . Nonischemic cardiomyopathy ([place]) 03/06/2012  . Tobacco abuse 03/06/2012  . Type 2 diabetes mellitus ([place]) 03/06/2012  . Hypertension 03/06/2012  . CAD (coronary artery disease), native coronary artery 03/06/2012  . Hypokalemia 03/06/2012  . Acute bronchitis 02/01/2009  . Sleep apnea 02/01/2009  . Chest pain 02/01/2009    Past Surgical History:  Procedure Laterality Date  . BI-VENTRICULAR IMPLANTABLE CARDIOVERTER DEFIBRILLATOR N/A 11/18/2012   Procedure: BI-VENTRICULAR IMPLANTABLE CARDIOVERTER DEFIBRILLATOR  (CRT-D);  Surgeon: Evans Lance, MD;  Location: [facility] CATH LAB;  Service: Cardiovascular;  Laterality: N/A;  . BI-VENTRICULAR IMPLANTABLE CARDIOVERTER DEFIBRILLATOR  (CRT-D)  11/18/2012  . CARDIAC CATHETERIZATION  03/04/12   nonobstructive CAD, elevated LVEDP and tortuous vessels suggestive of long-standing hypertension  . COLONOSCOPY WITH PROPOFOL N/A 12/12/2016   Procedure: COLONOSCOPY WITH PROPOFOL;  Surgeon: Carol Ada, MD;  Location: [facility] ENDOSCOPY;  Service: Endoscopy;  Laterality: N/A;  .  JOINT REPLACEMENT     Bilateral hip and right knee  . LEFT HEART CATH N/A 03/05/2012   Procedure: LEFT HEART CATH;  Surgeon: Larey Dresser, MD;  Location: [facility] CATH LAB;  Service: Cardiovascular;  Laterality: N/A;     OB History   None      Home Medications    Prior to Admission medications   Medication Sig Start Date End Date Taking? Authorizing Provider  acetaminophen (TYLENOL) 500  MG tablet Take 1 tablet (500 mg total) by mouth every 6 (six) hours as needed. Patient taking differently: Take 500 mg by mouth every 6 (six) hours as needed for mild pain.  09/15/15   Leo Grosser, MD  albuterol (PROVENTIL HFA;VENTOLIN HFA) 108 (90 Base) MCG/ACT inhaler Inhale 2 puffs into the lungs every 6 (six) hours as needed for wheezing or shortness of breath. 04/25/16   Ghimire, Henreitta Leber, MD  apixaban (ELIQUIS) 5 MG TABS tablet Take 1 tablet (5 mg total) by mouth 2 (two) times daily. 05/01/17   Larey Dresser, MD  atorvastatin (LIPITOR) 40 MG tablet Take 1 tablet (40 mg total) by mouth daily. 06/17/17   Larey Dresser, MD  carvedilol (COREG) 25 MG tablet Take 1 tablet (25 mg total) by mouth 2 (two) times daily with a meal. 03/17/17   Monita Friar, PA-C  ENTRESTO 97-103 MG TAKE 1 TABLET BY MOUTH TWICE DAILY 06/17/17   Bensimhon, Shaune Pascal, MD  furosemide (LASIX) 40 MG tablet Take 1 tablet (40 mg total) by mouth 2 (two) times daily. 05/19/17   Pritika Friar, PA-C  glucose blood (FREESTYLE TEST STRIPS) test strip Use as instructed 04/25/16   Ghimire, Henreitta Leber, MD  glucose monitoring kit (FREESTYLE) monitoring kit 1 each by Does not apply route 4 (four) times daily - after meals and at bedtime. 1 month Diabetic Testing Supplies for QAC-QHS accuchecks. 04/25/16   Ghimire, Henreitta Leber, MD  insulin aspart (NOVOLOG FLEXPEN) 100 UNIT/ML FlexPen 0-20 Units, Subcutaneous, 3 times daily with meals CBG < 70: implement hypoglycemia protocol CBG 70 - 120: 0 units CBG 121 - 150: 3 units CBG 151 - 200: 4 units CBG 201 - 250: 7 units CBG 251 - 300: 11 units CBG 301 - 350: 15 units CBG 351 - 400: 20 units CBG > 400: call MD 04/25/16   Jonetta Osgood, MD  Insulin Glargine (LANTUS SOLOSTAR) 100 UNIT/ML Solostar Pen Inject 50 Units into the skin daily at 10 pm.    [provider]  Insulin Pen Needle 32G X 8 MM MISC Use as directed 06/18/16   Arbutus Leas, NP  isosorbide-hydrALAZINE  (BIDIL) 20-37.5 MG tablet Take 2 tablets by mouth 3 (three) times daily. 09/11/16   Arbutus Leas, NP  Lancets (FREESTYLE) lancets Use as instructed 06/18/16   Arbutus Leas, NP  lidocaine (LIDODERM) 5 % Place 1 patch onto the skin daily. Remove & Discard patch within 12 hours or as directed by MD 06/28/17   Petrucelli, Aldona Bar R, PA-C  metFORMIN (GLUCOPHAGE) 1000 MG tablet TAKE 1 TABLET(1000 MG) BY MOUTH TWICE DAILY WITH A MEAL 05/30/16   Charo Friar, PA-C  methocarbamol (ROBAXIN) 500 MG tablet Take 1 tablet (500 mg total) by mouth every 8 (eight) hours as needed. 06/28/17   Petrucelli, Samantha R, PA-C  naproxen sodium (ALEVE) 220 MG tablet Take 220 mg daily as needed by mouth (pain).    [provider]  Oxycodone HCl 10 MG TABS Take 10  mg by mouth 3 (three) times daily. 12/09/16   [provider]  potassium chloride 20 MEQ TBCR Take 40 mEq by mouth daily. 10/07/16   Arbutus Leas, NP  spironolactone (ALDACTONE) 25 MG tablet Take 1 tablet (25 mg total) by mouth daily. 05/22/16   Clegg, Amy D, NP  spironolactone (ALDACTONE) 25 MG tablet TAKE 1 TABLET(25 MG) BY MOUTH DAILY 05/01/17   Bensimhon, Shaune Pascal, MD    Family History Family History  Problem Relation Age of Onset  . Heart disease Neg Hx     Social History Social History   Tobacco Use  . Smoking status: Former Smoker    Packs/day: 1.00    Years: 40.00    Pack years: 40.00    Types: Cigarettes    Start date: 06/23/1972    Last attempt to quit: 03/26/2012    Years since quitting: 5.2  . Smokeless tobacco: Never Used  Substance Use Topics  . Alcohol use: No  . Drug use: No     Allergies   Morphine and related   Review of Systems Review of Systems  All other systems reviewed and are negative.    Physical Exam Updated Vital Signs BP (!) 164/100   Pulse 86   Temp 98.4 F (36.9 C) (Oral)   Resp 16   Ht 1.702 m ('5\' 7"' )   Wt 98.4 kg (217 lb)   SpO2 100%   BMI 33.99 kg/m   Physical Exam    Constitutional: She is oriented to person, place, and time. She appears well-developed and well-nourished.  Non-toxic appearance. No distress.  HENT:  Head: Normocephalic and atraumatic.  Eyes: Pupils are equal, round, and reactive to light. Conjunctivae, EOM and lids are normal.  Neck: Normal range of motion. Neck supple. No tracheal deviation present. No thyroid mass present.  Cardiovascular: Normal rate, regular rhythm and normal heart sounds. Exam reveals no gallop.  No murmur heard. Pulmonary/Chest: Effort normal and breath sounds normal. No stridor. No respiratory distress. She has no decreased breath sounds. She has no wheezes. She has no rhonchi. She has no rales.  Abdominal: Soft. Normal appearance and bowel sounds are normal. She exhibits no distension. There is no tenderness. There is no rebound and no CVA tenderness.  Musculoskeletal: Normal range of motion. She exhibits no edema or tenderness.       Right hip: She exhibits bony tenderness. She exhibits no swelling and no deformity.       Legs: Neurological: She is alert and oriented to person, place, and time. She has normal strength. No cranial nerve deficit or sensory deficit. GCS eye subscore is 4. GCS verbal subscore is 5. GCS motor subscore is 6.  Reflex Scores:      Patellar reflexes are 2+ on the right side and 2+ on the left side. Skin: Skin is warm and dry. No abrasion and no rash noted.  Psychiatric: She has a normal mood and affect. Her speech is normal and behavior is normal.  Nursing note and vitals reviewed.    ED Treatments / Results  Labs (all labs ordered are listed, but only abnormal results are displayed) Labs Reviewed - No data to display  EKG None  Radiology No results found.  Procedures Procedures (including critical care time)  Medications Ordered in ED Medications  ketorolac (TORADOL) 30 MG/ML injection 15 mg (has no administration in time range)  LORazepam (ATIVAN) injection 1 mg (has no  administration in time range)  dexamethasone (DECADRON) injection 10  mg (has no administration in time range)  fentaNYL (SUBLIMAZE) injection 100 mcg (has no administration in time range)     Initial Impression / Assessment and Plan / ED Course  I have reviewed the triage vital signs and the nursing notes.  Pertinent labs & imaging results that were available during my care of the patient were reviewed by me and considered in my medical decision making (see chart for details).     Patient medicated for pain and feels much better at this time.  X-rays negative.  She has no focal neurological findings.  Instructed to follow-up with her pain management specialist  Final Clinical Impressions(s) / ED Diagnoses   Final diagnoses:  None    ED Discharge Orders    None       Colleen Leigh, MD 07/12/17 1156

## 2017-07-21 ENCOUNTER — Other Ambulatory Visit (HOSPITAL_COMMUNITY): Payer: Self-pay

## 2017-07-21 NOTE — Progress Notes (Signed)
Paramedicine Encounter    Patient ID: [name], female    DOB: Jan 01, 1951, 67 y.o.   MRN: [id number]   Patient Care Team: [name], MD as PCP - General (Family Medicine)  Patient Active Problem List   Diagnosis Date Noted  . Atrial fibrillation ([place]) 02/11/2017  . Paroxysmal A-fib ([place])   . Biventricular automatic implantable cardioverter defibrillator in situ   . Sepsis ([place]) 04/20/2016  . UTI (urinary tract infection) 04/20/2016  . Dyspnea 07/19/2015  . Interstitial lung disease ([place]) 11/20/2014  . SIRS (systemic inflammatory response syndrome) ([place]) 02/03/2014  . IDDM (insulin dependent diabetes mellitus) ([place]) 02/03/2014  . Tachycardia 06/14/2013  . Chronic systolic CHF (congestive heart failure) ([place]) 09/29/2012  . Hypoxia 03/06/2012  . Hypertensive heart disease 03/06/2012  . Nonischemic cardiomyopathy ([place]) 03/06/2012  . Tobacco abuse 03/06/2012  . Type 2 diabetes mellitus ([place]) 03/06/2012  . Hypertension 03/06/2012  . CAD (coronary artery disease), native coronary artery 03/06/2012  . Hypokalemia 03/06/2012  . Acute bronchitis 02/01/2009  . Sleep apnea 02/01/2009  . Chest pain 02/01/2009    Current Outpatient Medications:  .  acetaminophen (TYLENOL) 500 MG tablet, Take 1 tablet (500 mg total) by mouth every 6 (six) hours as needed. (Patient taking differently: Take 500 mg by mouth every 6 (six) hours as needed for mild pain. ), Disp: 30 tablet, Rfl: 0 .  albuterol (PROVENTIL HFA;VENTOLIN HFA) 108 (90 Base) MCG/ACT inhaler, Inhale 2 puffs into the lungs every 6 (six) hours as needed for wheezing or shortness of breath., Disp: 18 g, Rfl: 0 .  apixaban (ELIQUIS) 5 MG TABS tablet, Take 1 tablet (5 mg total) by mouth 2 (two) times daily., Disp: 60 tablet, Rfl: 3 .  atorvastatin (LIPITOR) 40 MG tablet, Take 1 tablet (40 mg total) by mouth daily., Disp: 30 tablet, Rfl: 3 .  carvedilol (COREG) 25 MG tablet, Take 1 tablet (25 mg total) by mouth 2 (two) times daily  with a meal., Disp: 60 tablet, Rfl: 11 .  ENTRESTO 97-103 MG, TAKE 1 TABLET BY MOUTH TWICE DAILY, Disp: 60 tablet, Rfl: 6 .  furosemide (LASIX) 40 MG tablet, Take 1 tablet (40 mg total) by mouth 2 (two) times daily., Disp: 180 tablet, Rfl: 3 .  glucose blood (FREESTYLE TEST STRIPS) test strip, Use as instructed, Disp: 100 each, Rfl: 0 .  glucose monitoring kit (FREESTYLE) monitoring kit, 1 each by Does not apply route 4 (four) times daily - after meals and at bedtime. 1 month Diabetic Testing Supplies for QAC-QHS accuchecks., Disp: 1 each, Rfl: 1 .  Insulin Glargine (LANTUS SOLOSTAR) 100 UNIT/ML Solostar Pen, Inject 50 Units into the skin daily at 10 pm., Disp: , Rfl:  .  Insulin Pen Needle 32G X 8 MM MISC, Use as directed, Disp: 100 each, Rfl: 0 .  isosorbide-hydrALAZINE (BIDIL) 20-37.5 MG tablet, Take 2 tablets by mouth 3 (three) times daily., Disp: 180 tablet, Rfl: 6 .  Lancets (FREESTYLE) lancets, Use as instructed, Disp: 100 each, Rfl: 0 .  naproxen sodium (ALEVE) 220 MG tablet, Take 220 mg daily as needed by mouth (pain)., Disp: , Rfl:  .  Oxycodone HCl 10 MG TABS, Take 10 mg by mouth 3 (three) times daily., Disp: , Rfl: 0 .  gabapentin (NEURONTIN) 100 MG capsule, TK ONE C PO QHS, Disp: , Rfl: 0 .  insulin aspart (NOVOLOG FLEXPEN) 100 UNIT/ML FlexPen, 0-20 Units, Subcutaneous, 3 times daily with meals CBG < 70: implement hypoglycemia protocol CBG 70 - 120:  0 units CBG 121 - 150: 3 units CBG 151 - 200: 4 units CBG 201 - 250: 7 units CBG 251 - 300: 11 units CBG 301 - 350: 15 units CBG 351 - 400: 20 units CBG > 400: call MD (Patient not taking: Reported on 07/21/2017), Disp: 15 mL, Rfl: 0 .  lidocaine (LIDODERM) 5 %, Place 1 patch onto the skin daily. Remove & Discard patch within 12 hours or as directed by MD (Patient not taking: Reported on 07/21/2017), Disp: 30 patch, Rfl: 0 .  metFORMIN (GLUCOPHAGE) 1000 MG tablet, TAKE 1 TABLET(1000 MG) BY MOUTH TWICE DAILY WITH A MEAL, Disp: 60 tablet, Rfl: 0 .   methocarbamol (ROBAXIN) 500 MG tablet, Take 1 tablet (500 mg total) by mouth every 8 (eight) hours as needed. (Patient not taking: Reported on 07/21/2017), Disp: 21 tablet, Rfl: 0 .  NARCAN 4 MG/0.1ML LIQD nasal spray kit, U UTD, Disp: , Rfl: 0 .  potassium chloride 20 MEQ TBCR, Take 40 mEq by mouth daily. (Patient not taking: Reported on 07/21/2017), Disp: 60 tablet, Rfl: 3 .  spironolactone (ALDACTONE) 25 MG tablet, Take 1 tablet (25 mg total) by mouth daily. (Patient not taking: Reported on 07/21/2017), Disp: 30 tablet, Rfl: 3 .  spironolactone (ALDACTONE) 25 MG tablet, TAKE 1 TABLET(25 MG) BY MOUTH DAILY (Patient not taking: Reported on 07/21/2017), Disp: 30 tablet, Rfl: 3 Allergies  Allergen Reactions  . Morphine And Related Nausea And Vomiting and Other (See Comments)    Severe nausea      Social History   Socioeconomic History  . Marital status: Single    Spouse name: Not on file  . Number of children: Not on file  . Years of education: Not on file  . Highest education level: Not on file  Occupational History  . Not on file  Social Needs  . Financial resource strain: Not on file  . Food insecurity:    Worry: Not on file    Inability: Not on file  . Transportation needs:    Medical: Not on file    Non-medical: Not on file  Tobacco Use  . Smoking status: Former Smoker    Packs/day: 1.00    Years: 40.00    Pack years: 40.00    Types: Cigarettes    Start date: 06/23/1972    Last attempt to quit: 03/26/2012    Years since quitting: 5.3  . Smokeless tobacco: Never Used  Substance and Sexual Activity  . Alcohol use: No  . Drug use: No  . Sexual activity: Yes  Lifestyle  . Physical activity:    Days per week: Not on file    Minutes per session: Not on file  . Stress: Not on file  Relationships  . Social connections:    Talks on phone: Not on file    Gets together: Not on file    Attends religious service: Not on file    Active member of club or organization: Not on  file    Attends meetings of clubs or organizations: Not on file    Relationship status: Not on file  . Intimate partner violence:    Fear of current or ex partner: Not on file    Emotionally abused: Not on file    Physically abused: Not on file    Forced sexual activity: Not on file  Other Topics Concern  . Not on file  Social History Narrative  . Not on file    Physical Exam  Constitutional:  She is oriented to person, place, and time.  Cardiovascular: Regular rhythm. Tachycardia present.  Pulmonary/Chest: Effort normal and breath sounds normal.  Abdominal: Soft.  Musculoskeletal: Normal range of motion. She exhibits no edema.  Neurological: She is alert and oriented to person, place, and time.  Skin: Skin is warm and dry.  Psychiatric: She has a normal mood and affect.        No future appointments.  BP 116/78 (BP Location: Right Arm, Patient Position: Sitting, Cuff Size: Large)   Pulse (!) 102   Resp 16   Wt 225 lb (102.1 kg)   SpO2 97%   BMI 35.24 kg/m   Weight yesterday- Did not weigh Last visit weight- 217 lb  Ms Gentles was seen at home today. She reported being in a lot of pain in her leg from a shot she received several months ago while at [facility]. She denied SOB, headache, dizziness or orthopnea. She reported being complaint with her medications however there were several doses still in her box and [name] has been unable to reach [facility] for several weeks, so I find it unlikely that she is taking her medications as directed. Furthermore she was out of several medications and when I called the pharmacy several more were ready for pick up. I advised her that she should have someone either take her to the pharmacy or have them pick up her medications later this evening and then call me when she has them so I can come finish filling her pillbox. She was understanding and agreeable. She advised that she has not been weighing herself lately and said she "just don't  think about it." She was educated on the importance of weighing daily and then she showed me the scale which had recently been mailed to her and asked if I was able to set it up. I got it out of the box but was not able to set it up to communicate with the tablet which was also provided because the tablet was not charged. I did show her how to use it the scale, plugged in the tablet and gave her the instructions on how to pair the tablet to the scale once it is charged. She was understanding and agreeable.   [name], EMT 07/21/17  ACTION: Home visit completed Next visit planned for 1 week

## 2017-07-26 ENCOUNTER — Emergency Department (HOSPITAL_BASED_OUTPATIENT_CLINIC_OR_DEPARTMENT_OTHER): Payer: Medicare Other

## 2017-07-26 ENCOUNTER — Encounter (HOSPITAL_COMMUNITY): Payer: Self-pay | Admitting: Emergency Medicine

## 2017-07-26 ENCOUNTER — Other Ambulatory Visit: Payer: Self-pay

## 2017-07-26 ENCOUNTER — Emergency Department (HOSPITAL_COMMUNITY)
Admission: EM | Admit: 2017-07-26 | Discharge: 2017-07-26 | Disposition: A | Discharge status: Home / Self Care | Payer: Medicare Other | Attending: Emergency Medicine | Admitting: Emergency Medicine

## 2017-07-26 DIAGNOSIS — E119 Type 2 diabetes mellitus without complications: Secondary | ICD-10-CM | POA: Diagnosis not present

## 2017-07-26 DIAGNOSIS — I5042 Chronic combined systolic (congestive) and diastolic (congestive) heart failure: Secondary | ICD-10-CM | POA: Diagnosis not present

## 2017-07-26 DIAGNOSIS — Z794 Long term (current) use of insulin: Secondary | ICD-10-CM | POA: Insufficient documentation

## 2017-07-26 DIAGNOSIS — M79609 Pain in unspecified limb: Secondary | ICD-10-CM | POA: Diagnosis not present

## 2017-07-26 DIAGNOSIS — I4891 Unspecified atrial fibrillation: Secondary | ICD-10-CM | POA: Insufficient documentation

## 2017-07-26 DIAGNOSIS — I110 Hypertensive heart disease with heart failure: Secondary | ICD-10-CM | POA: Insufficient documentation

## 2017-07-26 DIAGNOSIS — Z79899 Other long term (current) drug therapy: Secondary | ICD-10-CM | POA: Insufficient documentation

## 2017-07-26 DIAGNOSIS — M79604 Pain in right leg: Secondary | ICD-10-CM

## 2017-07-26 DIAGNOSIS — Z87891 Personal history of nicotine dependence: Secondary | ICD-10-CM | POA: Diagnosis not present

## 2017-07-26 DIAGNOSIS — Z7901 Long term (current) use of anticoagulants: Secondary | ICD-10-CM | POA: Diagnosis not present

## 2017-07-26 DIAGNOSIS — Z96651 Presence of right artificial knee joint: Secondary | ICD-10-CM | POA: Insufficient documentation

## 2017-07-26 DIAGNOSIS — Z95810 Presence of automatic (implantable) cardiac defibrillator: Secondary | ICD-10-CM | POA: Insufficient documentation

## 2017-07-26 DIAGNOSIS — I2510 Atherosclerotic heart disease of native coronary artery without angina pectoris: Secondary | ICD-10-CM | POA: Insufficient documentation

## 2017-07-26 DIAGNOSIS — Z96643 Presence of artificial hip joint, bilateral: Secondary | ICD-10-CM | POA: Insufficient documentation

## 2017-07-26 LAB — BASIC METABOLIC PANEL
Anion gap: 9 (ref 5–15)
BUN: 10 mg/dL (ref 8–23)
CO2: 29 mmol/L (ref 22–32)
Calcium: 9.3 mg/dL (ref 8.9–10.3)
Chloride: 98 mmol/L (ref 98–111)
Creatinine, Ser: 0.95 mg/dL (ref 0.44–1.00)
GFR calc Af Amer: >60 mL/min (ref >60)
GFR calc non Af Amer: >60 mL/min (ref >60)
Glucose, Bld: 371 mg/dL — ABNORMAL HIGH (ref 70–99)
Potassium: 3.8 mmol/L (ref 3.5–5.1)
Sodium: 136 mmol/L (ref 135–145)

## 2017-07-26 LAB — CBC
HCT: 33.4 % — ABNORMAL LOW (ref 36.0–46.0)
Hemoglobin: 11.4 g/dL — ABNORMAL LOW (ref 12.0–15.0)
MCH: 30.4 pg (ref 26.0–34.0)
MCHC: 34.1 g/dL (ref 30.0–36.0)
MCV: 89.1 fL (ref 78.0–100.0)
Platelets: 308 10*3/uL (ref 150–400)
RBC: 3.75 MIL/uL — ABNORMAL LOW (ref 3.87–5.11)
RDW: 12.1 % (ref 11.5–15.5)
WBC: 6.1 10*3/uL (ref 4.0–10.5)

## 2017-07-26 LAB — CK: Total CK: 47 U/L (ref 38–234)

## 2017-07-26 MED ORDER — ONDANSETRON HCL 4 MG/2ML IJ SOLN
4.0000 mg | Freq: Once | INTRAMUSCULAR | Status: AC
  Administered 2017-07-26: 4 mg via INTRAVENOUS
  Filled 2017-07-26: qty 2

## 2017-07-26 MED ORDER — HYDROMORPHONE HCL 1 MG/ML IJ SOLN
1.0000 mg | Freq: Once | INTRAMUSCULAR | Status: AC
  Administered 2017-07-26: 1 mg via INTRAVENOUS
  Filled 2017-07-26: qty 1

## 2017-07-26 MED ORDER — DICLOFENAC SODIUM 1 % TD GEL: 4.0000 g | Freq: Four times a day (QID) | TRANSDERMAL | 0 refills | Status: DC

## 2017-07-26 MED ORDER — KETOROLAC TROMETHAMINE 15 MG/ML IJ SOLN
15.0000 mg | Freq: Once | INTRAMUSCULAR | Status: AC
  Administered 2017-07-26: 15 mg via INTRAVENOUS
  Filled 2017-07-26: qty 1

## 2017-07-26 MED ORDER — ONDANSETRON 4 MG PO TBDP
4.0000 mg | ORAL_TABLET | Freq: Once | ORAL | Status: AC
  Administered 2017-07-26: 4 mg via ORAL
  Filled 2017-07-26: qty 1

## 2017-07-26 MED ORDER — METHOCARBAMOL 500 MG PO TABS: 500.0000 mg | ORAL_TABLET | Freq: Three times a day (TID) | ORAL | 0 refills | Status: DC | PRN

## 2017-07-26 NOTE — ED Provider Notes (Signed)
[facility] EMERGENCY DEPARTMENT Provider Note   CSN: [id number] Arrival date & time: 07/26/17  0808     History   Chief Complaint Chief Complaint  Patient presents with  . Leg Pain    HPI [name] is a 67 y.o. female.  HPI 67 year old female with extensive past medical history as below including chronic pain here with ongoing right leg pain.  The patient has been seen multiple times for this.  She reportedly had a penicillin injection in her leg several weeks ago.  Since then, she reports aching, throbbing, but intermittent sharp and stabbing pain to her right leg.  She been seen multiple times for it.  Denies any trauma.  Denies any bone pain.  The pain is around her site of injection and shoots in her leg.  Denies any numbness or weakness.  She has had some difficulty walking due to the pain.  She been taking her Percocets with only moderate relief.  No other medical complaints.  No fevers or chills.  No back pain above her baseline.  No loss of bowel bladder function.  Past Medical History:  Diagnosis Date  . Anemia    a. Noted on 07/2012 labs, instructed to f/u PCP.  Marland Kitchen Arthritis    "joints" (11/18/2012)  . CAD (coronary artery disease), native coronary artery    a. Nonobstructive by cath 02/2012 (done because of low EF).  . Chronic bronchitis ([place])    "~ every other year" (11/18/2012)  . Chronic combined systolic and diastolic CHF (congestive heart failure) ([place])    a. 03/05/12 echo:  LVEF 20-25%, moderate LVH , inferior and basal to mid septal akinesis, anterior moderate to severe hypokinesis and grade 2 diastolic dysfunction. b. EF 07/2012: EF still 25% (unclear medication compliance).  . Chronic lower back pain   . Headache(784.0)    "often; maybe not daily" (11/18/2012)  . High cholesterol   . History of noncompliance with medical treatment   . Hypertension   . LBBB (left bundle branch block)   . Orthopnea   . Tobacco abuse   . Type II diabetes  mellitus [facility])     Patient Active Problem List   Diagnosis Date Noted  . Atrial fibrillation ([place]) 02/11/2017  . Paroxysmal A-fib ([place])   . Biventricular automatic implantable cardioverter defibrillator in situ   . Sepsis ([place]) 04/20/2016  . UTI (urinary tract infection) 04/20/2016  . Dyspnea 07/19/2015  . Interstitial lung disease ([place]) 11/20/2014  . SIRS (systemic inflammatory response syndrome) ([place]) 02/03/2014  . IDDM (insulin dependent diabetes mellitus) ([place]) 02/03/2014  . Tachycardia 06/14/2013  . Chronic systolic CHF (congestive heart failure) ([place]) 09/29/2012  . Hypoxia 03/06/2012  . Hypertensive heart disease 03/06/2012  . Nonischemic cardiomyopathy ([place]) 03/06/2012  . Tobacco abuse 03/06/2012  . Type 2 diabetes mellitus ([place]) 03/06/2012  . Hypertension 03/06/2012  . CAD (coronary artery disease), native coronary artery 03/06/2012  . Hypokalemia 03/06/2012  . Acute bronchitis 02/01/2009  . Sleep apnea 02/01/2009  . Chest pain 02/01/2009    Past Surgical History:  Procedure Laterality Date  . BI-VENTRICULAR IMPLANTABLE CARDIOVERTER DEFIBRILLATOR N/A 11/18/2012   Procedure: BI-VENTRICULAR IMPLANTABLE CARDIOVERTER DEFIBRILLATOR  (CRT-D);  Surgeon: Evans Lance, MD;  Location: [facility] CATH LAB;  Service: Cardiovascular;  Laterality: N/A;  . BI-VENTRICULAR IMPLANTABLE CARDIOVERTER DEFIBRILLATOR  (CRT-D)  11/18/2012  . CARDIAC CATHETERIZATION  03/04/12   nonobstructive CAD, elevated LVEDP and tortuous vessels suggestive of long-standing hypertension  . COLONOSCOPY WITH PROPOFOL N/A 12/12/2016  Procedure: COLONOSCOPY WITH PROPOFOL;  Surgeon: Carol Ada, MD;  Location: [facility] ENDOSCOPY;  Service: Endoscopy;  Laterality: N/A;  . JOINT REPLACEMENT     Bilateral hip and right knee  . LEFT HEART CATH N/A 03/05/2012   Procedure: LEFT HEART CATH;  Surgeon: Larey Dresser, MD;  Location: [facility] CATH LAB;  Service: Cardiovascular;  Laterality: N/A;     OB History   None      Home  Medications    Prior to Admission medications   Medication Sig Start Date End Date Taking? Authorizing Provider  acetaminophen (TYLENOL) 500 MG tablet Take 1 tablet (500 mg total) by mouth every 6 (six) hours as needed. Patient taking differently: Take 500 mg by mouth every 6 (six) hours as needed for mild pain.  09/15/15   Leo Grosser, MD  albuterol (PROVENTIL HFA;VENTOLIN HFA) 108 (90 Base) MCG/ACT inhaler Inhale 2 puffs into the lungs every 6 (six) hours as needed for wheezing or shortness of breath. 04/25/16   Ghimire, Henreitta Leber, MD  apixaban (ELIQUIS) 5 MG TABS tablet Take 1 tablet (5 mg total) by mouth 2 (two) times daily. 05/01/17   Larey Dresser, MD  atorvastatin (LIPITOR) 40 MG tablet Take 1 tablet (40 mg total) by mouth daily. 06/17/17   Larey Dresser, MD  carvedilol (COREG) 25 MG tablet Take 1 tablet (25 mg total) by mouth 2 (two) times daily with a meal. 03/17/17   Tillery, Satira Mccallum, PA-C  diclofenac sodium (VOLTAREN) 1 % GEL Apply 4 g topically 4 (four) times daily. 07/26/17   Duffy Bruce, MD  ENTRESTO 97-103 MG TAKE 1 TABLET BY MOUTH TWICE DAILY 06/17/17   Bensimhon, Shaune Pascal, MD  furosemide (LASIX) 40 MG tablet Take 1 tablet (40 mg total) by mouth 2 (two) times daily. 05/19/17   Estephany Friar, PA-C  gabapentin (NEURONTIN) 100 MG capsule TK ONE C PO QHS 06/03/17   [provider]  glucose blood (FREESTYLE TEST STRIPS) test strip Use as instructed 04/25/16   Ghimire, Henreitta Leber, MD  glucose monitoring kit (FREESTYLE) monitoring kit 1 each by Does not apply route 4 (four) times daily - after meals and at bedtime. 1 month Diabetic Testing Supplies for QAC-QHS accuchecks. 04/25/16   Ghimire, Henreitta Leber, MD  insulin aspart (NOVOLOG FLEXPEN) 100 UNIT/ML FlexPen 0-20 Units, Subcutaneous, 3 times daily with meals CBG < 70: implement hypoglycemia protocol CBG 70 - 120: 0 units CBG 121 - 150: 3 units CBG 151 - 200: 4 units CBG 201 - 250: 7 units CBG 251 - 300: 11  units CBG 301 - 350: 15 units CBG 351 - 400: 20 units CBG > 400: call MD Patient not taking: Reported on 07/21/2017 04/25/16   Jonetta Osgood, MD  Insulin Glargine (LANTUS SOLOSTAR) 100 UNIT/ML Solostar Pen Inject 50 Units into the skin daily at 10 pm.    [provider]  Insulin Pen Needle 32G X 8 MM MISC Use as directed 06/18/16   Arbutus Leas, NP  isosorbide-hydrALAZINE (BIDIL) 20-37.5 MG tablet Take 2 tablets by mouth 3 (three) times daily. 09/11/16   Arbutus Leas, NP  Lancets (FREESTYLE) lancets Use as instructed 06/18/16   Arbutus Leas, NP  lidocaine (LIDODERM) 5 % Place 1 patch onto the skin daily. Remove & Discard patch within 12 hours or as directed by MD Patient not taking: Reported on 07/21/2017 06/28/17   Petrucelli, Aldona Bar R, PA-C  metFORMIN (GLUCOPHAGE) 1000 MG tablet TAKE 1 TABLET(1000 MG)  BY MOUTH TWICE DAILY WITH A MEAL 05/30/16   Alli Friar, PA-C  methocarbamol (ROBAXIN) 500 MG tablet Take 1 tablet (500 mg total) by mouth every 8 (eight) hours as needed for muscle spasms. 07/26/17   Duffy Bruce, MD  naproxen sodium (ALEVE) 220 MG tablet Take 220 mg daily as needed by mouth (pain).    [provider]  NARCAN 4 MG/0.1ML LIQD nasal spray kit U UTD 04/24/17   [provider]  Oxycodone HCl 10 MG TABS Take 10 mg by mouth 3 (three) times daily. 12/09/16   [provider]  potassium chloride 20 MEQ TBCR Take 40 mEq by mouth daily. Patient not taking: Reported on 07/21/2017 10/07/16   Arbutus Leas, NP  spironolactone (ALDACTONE) 25 MG tablet Take 1 tablet (25 mg total) by mouth daily. Patient not taking: Reported on 07/21/2017 05/22/16   Darrick Grinder D, NP  spironolactone (ALDACTONE) 25 MG tablet TAKE 1 TABLET(25 MG) BY MOUTH DAILY Patient not taking: Reported on 07/21/2017 05/01/17   Bensimhon, Shaune Pascal, MD    Family History Family History  Problem Relation Age of Onset  . Heart disease Neg Hx     Social History Social History    Tobacco Use  . Smoking status: Former Smoker    Packs/day: 1.00    Years: 40.00    Pack years: 40.00    Types: Cigarettes    Start date: 06/23/1972    Last attempt to quit: 03/26/2012    Years since quitting: 5.3  . Smokeless tobacco: Never Used  Substance Use Topics  . Alcohol use: No  . Drug use: No     Allergies   Morphine and related   Review of Systems Review of Systems  Constitutional: Positive for fatigue. Negative for chills and fever.  HENT: Negative for congestion, rhinorrhea and sore throat.   Eyes: Negative for visual disturbance.  Respiratory: Negative for cough, shortness of breath and wheezing.   Cardiovascular: Negative for chest pain and leg swelling.  Gastrointestinal: Negative for abdominal pain, diarrhea, nausea and vomiting.  Genitourinary: Negative for dysuria, flank pain, vaginal bleeding and vaginal discharge.  Musculoskeletal: Positive for arthralgias and gait problem. Negative for neck pain.  Skin: Negative for rash.  Allergic/Immunologic: Negative for immunocompromised state.  Neurological: Negative for syncope and headaches.  Hematological: Does not bruise/bleed easily.  All other systems reviewed and are negative.    Physical Exam Updated Vital Signs BP (!) 145/74 (BP Location: Right Arm)   Pulse 92   Temp 98.3 F (36.8 C) (Oral)   Resp 20   Ht _0  (1.702 m)   Wt 100.2 kg (221 lb)   SpO2 100%   BMI 34.61 kg/m   Physical Exam  Constitutional: She is oriented to person, place, and time. She appears well-developed and well-nourished. No distress.  HENT:  Head: Normocephalic and atraumatic.  Eyes: Conjunctivae are normal.  Neck: Neck supple.  Cardiovascular: Normal rate, regular rhythm and normal heart sounds. Exam reveals no friction rub.  No murmur heard. Pulmonary/Chest: Effort normal and breath sounds normal. No respiratory distress. She has no wheezes. She has no rales.  Abdominal: She exhibits no distension.   Musculoskeletal: She exhibits no edema.  Neurological: She is alert and oriented to person, place, and time. She exhibits normal muscle tone.  Skin: Skin is warm. Capillary refill takes less than 2 seconds.  Psychiatric: She has a normal mood and affect.  Nursing note and vitals reviewed.   LOWER EXTREMITY  EXAM: RIGHT  INSPECTION & PALPATION: Injection site R lateral anterior thigh c/d/i, no erythema, no fluctuance, no swelling. Diffuse TTP throughout leg distal to this area.   SENSORY: sensation is intact to light touch in:  Superficial peroneal nerve distribution (over dorsum of foot) Deep peroneal nerve distribution (over first dorsal web space) Sural nerve distribution (over lateral aspect 5th metatarsal) Saphenous nerve distribution (over medial instep)  MOTOR:  + Motor EHL (great toe dorsiflexion) + FHL (great toe plantar flexion)  + TA (ankle dorsiflexion)  + GSC (ankle plantar flexion)  VASCULAR: 2+ dorsalis pedis and posterior tibialis pulses Capillary refill < 2 sec, toes warm and well-perfused  COMPARTMENTS: Soft, warm, well-perfused No pain with passive extension No parethesias    ED Treatments / Results  Labs (all labs ordered are listed, but only abnormal results are displayed) Labs Reviewed  CBC - Abnormal; Notable for the following components:      Result Value   RBC 3.75 (*)    Hemoglobin 11.4 (*)    HCT 33.4 (*)    All other components within normal limits  BASIC METABOLIC PANEL - Abnormal; Notable for the following components:   Glucose, Bld 371 (*)    All other components within normal limits  CK    EKG None  Radiology No results found.  Procedures Procedures (including critical care time)  Medications Ordered in ED Medications  HYDROmorphone (DILAUDID) injection 1 mg (1 mg Intravenous Given 07/26/17 0918)  ketorolac (TORADOL) 15 MG/ML injection 15 mg (15 mg Intravenous Given 07/26/17 0918)  ondansetron (ZOFRAN) injection 4 mg (4 mg  Intravenous Given 07/26/17 0918)  HYDROmorphone (DILAUDID) injection 1 mg (1 mg Intravenous Given 07/26/17 1146)     Initial Impression / Assessment and Plan / ED Course  I have reviewed the triage vital signs and the nursing notes.  Pertinent labs & imaging results that were available during my care of the patient were reviewed by me and considered in my medical decision making (see chart for details).     67 year old female here with right leg pain.  Patient has been seen multiple times for this.  It is localized around injection site and suspect possible mild local injection site reaction versus paresthesias from medication injection.  The patient is otherwise fully neurovascularly intact.  Duplex ultrasound without DVT.  Her lab work is reassuring.  No evidence of myositis or muscle breakdown.  Patient feels better after analgesia here.  She has no referred pain from her back, no loss of bowel or bladder function, or other signs of radiculopathy or cord compression.  Will advised localized treatment with Voltaren gel directly to the site, continue her otherwise pain meds, and follow-up with pain clinic.  Final Clinical Impressions(s) / ED Diagnoses   Final diagnoses:  Right leg pain    ED Discharge Orders        Ordered    diclofenac sodium (VOLTAREN) 1 % GEL  4 times daily     07/26/17 1225    methocarbamol (ROBAXIN) 500 MG tablet  Every 8 hours PRN     07/26/17 1225       Duffy Bruce, MD 07/26/17 1225

## 2017-07-26 NOTE — ED Triage Notes (Signed)
Pt brought in via EMS from home with c/o right sided leg pain 10/10. Pt states pain began during the night and has kept her awake for most of the night. Pt states she fell about 2 weeks ago but denies any injuries.

## 2017-07-26 NOTE — Progress Notes (Signed)
VASCULAR LAB PRELIMINARY  PRELIMINARY  PRELIMINARY  PRELIMINARY  Right lower extremity venous duplex completed.    Preliminary report:  There is no DVT or SVT noted in the right ower extremity.   [name], RVT 07/26/2017, 10:06 AM

## 2017-07-26 NOTE — ED Notes (Signed)
Pt sick on stomach and vomiting  Caren Griffins RN made aware.

## 2017-07-26 NOTE — ED Notes (Signed)
Patient transported to Ultrasound 

## 2017-07-26 NOTE — ED Notes (Signed)
Patient transported to vascular. 

## 2017-07-27 ENCOUNTER — Telehealth (HOSPITAL_COMMUNITY): Payer: Self-pay

## 2017-07-27 NOTE — Telephone Encounter (Signed)
I called [name] to schedule an appointment. She stated she was not going to be home today or Wednesday so we agreed to meet Tuesday at 14:30.

## 2017-07-28 ENCOUNTER — Other Ambulatory Visit (HOSPITAL_COMMUNITY): Payer: Self-pay

## 2017-07-28 NOTE — Progress Notes (Signed)
Paramedicine Encounter    Patient ID: [name], female    DOB: [birth year], 67 y.o.   MRN: [id number]   Patient Care Team: [name], MD as PCP - General (Family Medicine)  Patient Active Problem List   Diagnosis Date Noted  . Atrial fibrillation ([place]) 02/11/2017  . Paroxysmal A-fib ([place])   . Biventricular automatic implantable cardioverter defibrillator in situ   . Sepsis ([place]) 04/20/2016  . UTI (urinary tract infection) 04/20/2016  . Dyspnea 07/19/2015  . Interstitial lung disease ([place]) 11/20/2014  . SIRS (systemic inflammatory response syndrome) ([place]) 02/03/2014  . IDDM (insulin dependent diabetes mellitus) ([place]) 02/03/2014  . Tachycardia 06/14/2013  . Chronic systolic CHF (congestive heart failure) ([place]) 09/29/2012  . Hypoxia 03/06/2012  . Hypertensive heart disease 03/06/2012  . Nonischemic cardiomyopathy ([place]) 03/06/2012  . Tobacco abuse 03/06/2012  . Type 2 diabetes mellitus ([place]) 03/06/2012  . Hypertension 03/06/2012  . CAD (coronary artery disease), native coronary artery 03/06/2012  . Hypokalemia 03/06/2012  . Acute bronchitis 02/01/2009  . Sleep apnea 02/01/2009  . Chest pain 02/01/2009    Current Outpatient Medications:  .  acetaminophen (TYLENOL) 500 MG tablet, Take 1 tablet (500 mg total) by mouth every 6 (six) hours as needed. (Patient taking differently: Take 500 mg by mouth every 6 (six) hours as needed for mild pain. ), Disp: 30 tablet, Rfl: 0 .  albuterol (PROVENTIL HFA;VENTOLIN HFA) 108 (90 Base) MCG/ACT inhaler, Inhale 2 puffs into the lungs every 6 (six) hours as needed for wheezing or shortness of breath., Disp: 18 g, Rfl: 0 .  apixaban (ELIQUIS) 5 MG TABS tablet, Take 1 tablet (5 mg total) by mouth 2 (two) times daily., Disp: 60 tablet, Rfl: 3 .  atorvastatin (LIPITOR) 40 MG tablet, Take 1 tablet (40 mg total) by mouth daily., Disp: 30 tablet, Rfl: 3 .  carvedilol (COREG) 25 MG tablet, Take 1 tablet (25 mg total) by mouth 2 (two) times daily  with a meal., Disp: 60 tablet, Rfl: 11 .  diclofenac sodium (VOLTAREN) 1 % GEL, Apply 4 g topically 4 (four) times daily., Disp: 100 g, Rfl: 0 .  ENTRESTO 97-103 MG, TAKE 1 TABLET BY MOUTH TWICE DAILY, Disp: 60 tablet, Rfl: 6 .  furosemide (LASIX) 40 MG tablet, Take 1 tablet (40 mg total) by mouth 2 (two) times daily., Disp: 180 tablet, Rfl: 3 .  insulin aspart (NOVOLOG FLEXPEN) 100 UNIT/ML FlexPen, 0-20 Units, Subcutaneous, 3 times daily with meals CBG < 70: implement hypoglycemia protocol CBG 70 - 120: 0 units CBG 121 - 150: 3 units CBG 151 - 200: 4 units CBG 201 - 250: 7 units CBG 251 - 300: 11 units CBG 301 - 350: 15 units CBG 351 - 400: 20 units CBG > 400: call MD, Disp: 15 mL, Rfl: 0 .  Insulin Glargine (LANTUS SOLOSTAR) 100 UNIT/ML Solostar Pen, Inject 50 Units into the skin daily at 10 pm., Disp: , Rfl:  .  isosorbide-hydrALAZINE (BIDIL) 20-37.5 MG tablet, Take 2 tablets by mouth 3 (three) times daily., Disp: 180 tablet, Rfl: 6 .  metFORMIN (GLUCOPHAGE) 1000 MG tablet, TAKE 1 TABLET(1000 MG) BY MOUTH TWICE DAILY WITH A MEAL, Disp: 60 tablet, Rfl: 0 .  spironolactone (ALDACTONE) 25 MG tablet, Take 1 tablet (25 mg total) by mouth daily., Disp: 30 tablet, Rfl: 3 .  gabapentin (NEURONTIN) 100 MG capsule, TK ONE C PO QHS, Disp: , Rfl: 0 .  glucose blood (FREESTYLE TEST STRIPS) test strip, Use as instructed, Disp: 100  each, Rfl: 0 .  glucose monitoring kit (FREESTYLE) monitoring kit, 1 each by Does not apply route 4 (four) times daily - after meals and at bedtime. 1 month Diabetic Testing Supplies for QAC-QHS accuchecks., Disp: 1 each, Rfl: 1 .  Insulin Pen Needle 32G X 8 MM MISC, Use as directed, Disp: 100 each, Rfl: 0 .  Lancets (FREESTYLE) lancets, Use as instructed, Disp: 100 each, Rfl: 0 .  lidocaine (LIDODERM) 5 %, Place 1 patch onto the skin daily. Remove & Discard patch within 12 hours or as directed by MD (Patient not taking: Reported on 07/21/2017), Disp: 30 patch, Rfl: 0 .  methocarbamol  (ROBAXIN) 500 MG tablet, Take 1 tablet (500 mg total) by mouth every 8 (eight) hours as needed for muscle spasms. (Patient not taking: Reported on 07/28/2017), Disp: 20 tablet, Rfl: 0 .  naproxen sodium (ALEVE) 220 MG tablet, Take 220 mg daily as needed by mouth (pain)., Disp: , Rfl:  .  NARCAN 4 MG/0.1ML LIQD nasal spray kit, U UTD, Disp: , Rfl: 0 .  Oxycodone HCl 10 MG TABS, Take 10 mg by mouth 3 (three) times daily., Disp: , Rfl: 0 .  potassium chloride 20 MEQ TBCR, Take 40 mEq by mouth daily. (Patient not taking: Reported on 07/21/2017), Disp: 60 tablet, Rfl: 3 .  spironolactone (ALDACTONE) 25 MG tablet, TAKE 1 TABLET(25 MG) BY MOUTH DAILY (Patient not taking: Reported on 07/21/2017), Disp: 30 tablet, Rfl: 3 Allergies  Allergen Reactions  . Morphine And Related Nausea And Vomiting and Other (See Comments)    Severe nausea      Social History   Socioeconomic History  . Marital status: Single    Spouse name: Not on file  . Number of children: Not on file  . Years of education: Not on file  . Highest education level: Not on file  Occupational History  . Not on file  Social Needs  . Financial resource strain: Not on file  . Food insecurity:    Worry: Not on file    Inability: Not on file  . Transportation needs:    Medical: Not on file    Non-medical: Not on file  Tobacco Use  . Smoking status: Former Smoker    Packs/day: 1.00    Years: 40.00    Pack years: 40.00    Types: Cigarettes    Start date: 06/23/1972    Last attempt to quit: 03/26/2012    Years since quitting: 5.3  . Smokeless tobacco: Never Used  Substance and Sexual Activity  . Alcohol use: No  . Drug use: No  . Sexual activity: Yes  Lifestyle  . Physical activity:    Days per week: Not on file    Minutes per session: Not on file  . Stress: Not on file  Relationships  . Social connections:    Talks on phone: Not on file    Gets together: Not on file    Attends religious service: Not on file    Active  member of club or organization: Not on file    Attends meetings of clubs or organizations: Not on file    Relationship status: Not on file  . Intimate partner violence:    Fear of current or ex partner: Not on file    Emotionally abused: Not on file    Physically abused: Not on file    Forced sexual activity: Not on file  Other Topics Concern  . Not on file  Social History Narrative  .  Not on file    Physical Exam  Constitutional: She is oriented to person, place, and time.  Cardiovascular: Normal rate and regular rhythm.  Pulmonary/Chest: Effort normal and breath sounds normal.  Musculoskeletal: Normal range of motion. She exhibits no edema.  Neurological: She is alert and oriented to person, place, and time.  Skin: Skin is warm and dry.  Psychiatric: She has a normal mood and affect.        No future appointments.  BP (!) 160/90 (BP Location: Right Arm, Patient Position: Sitting, Cuff Size: Large)   Pulse 96   Resp 16   Wt 218 lb 3.2 oz (99 kg)   SpO2 98%   BMI 34.17 kg/m   Weight yesterday- did not weigh Last visit weight- 225 lb  Ms Noguera was seen at home today and reported feeling well with the exception of her leg pain. She upon preparing to fill her pillbox, it was found to be almost completely full. I asked why she has not been taking her medications and she exclaimed "I have!" When I pointed out the pillbox, she said that she had "just been so busy" and forgot. I educated her on the importance of taking the medications and encouraged her to take care of herself before taking care of anyone or anything else. She was agreeable. I verified her medications and refilled to two slots that were empty. After speaking with Demetria, non-compliance seems to be a trend with Ms [place]. I will bring this to the clinic meeting Wednesday.   [name], EMT 07/28/17  ACTION: Home visit completed Next visit planned for 1 week

## 2017-07-31 ENCOUNTER — Encounter: Payer: Self-pay | Admitting: Cardiology

## 2017-08-12 ENCOUNTER — Other Ambulatory Visit (HOSPITAL_COMMUNITY): Payer: Self-pay

## 2017-08-12 NOTE — Progress Notes (Signed)
Paramedicine Encounter    Patient ID: [name], female    DOB: 11-Mar-1950, 67 y.o.   MRN: [id number]   Patient Care Team: [name], MD as PCP - General (Family Medicine)  Patient Active Problem List   Diagnosis Date Noted  . Atrial fibrillation ([place]) 02/11/2017  . Paroxysmal A-fib ([place])   . Biventricular automatic implantable cardioverter defibrillator in situ   . Sepsis ([place]) 04/20/2016  . UTI (urinary tract infection) 04/20/2016  . Dyspnea 07/19/2015  . Interstitial lung disease ([place]) 11/20/2014  . SIRS (systemic inflammatory response syndrome) ([place]) 02/03/2014  . IDDM (insulin dependent diabetes mellitus) ([place]) 02/03/2014  . Tachycardia 06/14/2013  . Chronic systolic CHF (congestive heart failure) ([place]) 09/29/2012  . Hypoxia 03/06/2012  . Hypertensive heart disease 03/06/2012  . Nonischemic cardiomyopathy ([place]) 03/06/2012  . Tobacco abuse 03/06/2012  . Type 2 diabetes mellitus ([place]) 03/06/2012  . Hypertension 03/06/2012  . CAD (coronary artery disease), native coronary artery 03/06/2012  . Hypokalemia 03/06/2012  . Acute bronchitis 02/01/2009  . Sleep apnea 02/01/2009  . Chest pain 02/01/2009    Current Outpatient Medications:  .  acetaminophen (TYLENOL) 500 MG tablet, Take 1 tablet (500 mg total) by mouth every 6 (six) hours as needed. (Patient taking differently: Take 500 mg by mouth every 6 (six) hours as needed for mild pain. ), Disp: 30 tablet, Rfl: 0 .  albuterol (PROVENTIL HFA;VENTOLIN HFA) 108 (90 Base) MCG/ACT inhaler, Inhale 2 puffs into the lungs every 6 (six) hours as needed for wheezing or shortness of breath., Disp: 18 g, Rfl: 0 .  apixaban (ELIQUIS) 5 MG TABS tablet, Take 1 tablet (5 mg total) by mouth 2 (two) times daily., Disp: 60 tablet, Rfl: 3 .  atorvastatin (LIPITOR) 40 MG tablet, Take 1 tablet (40 mg total) by mouth daily., Disp: 30 tablet, Rfl: 3 .  carvedilol (COREG) 25 MG tablet, Take 1 tablet (25 mg total) by mouth 2 (two) times daily  with a meal., Disp: 60 tablet, Rfl: 11 .  diclofenac sodium (VOLTAREN) 1 % GEL, Apply 4 g topically 4 (four) times daily., Disp: 100 g, Rfl: 0 .  ENTRESTO 97-103 MG, TAKE 1 TABLET BY MOUTH TWICE DAILY, Disp: 60 tablet, Rfl: 6 .  furosemide (LASIX) 40 MG tablet, Take 1 tablet (40 mg total) by mouth 2 (two) times daily., Disp: 180 tablet, Rfl: 3 .  Insulin Glargine (LANTUS SOLOSTAR) 100 UNIT/ML Solostar Pen, Inject 50 Units into the skin daily at 10 pm., Disp: , Rfl:  .  isosorbide-hydrALAZINE (BIDIL) 20-37.5 MG tablet, Take 2 tablets by mouth 3 (three) times daily., Disp: 180 tablet, Rfl: 6 .  metFORMIN (GLUCOPHAGE) 1000 MG tablet, TAKE 1 TABLET(1000 MG) BY MOUTH TWICE DAILY WITH A MEAL, Disp: 60 tablet, Rfl: 0 .  naproxen sodium (ALEVE) 220 MG tablet, Take 220 mg daily as needed by mouth (pain)., Disp: , Rfl:  .  spironolactone (ALDACTONE) 25 MG tablet, Take 1 tablet (25 mg total) by mouth daily., Disp: 30 tablet, Rfl: 3 .  gabapentin (NEURONTIN) 100 MG capsule, TK ONE C PO QHS, Disp: , Rfl: 0 .  glucose blood (FREESTYLE TEST STRIPS) test strip, Use as instructed, Disp: 100 each, Rfl: 0 .  glucose monitoring kit (FREESTYLE) monitoring kit, 1 each by Does not apply route 4 (four) times daily - after meals and at bedtime. 1 month Diabetic Testing Supplies for QAC-QHS accuchecks., Disp: 1 each, Rfl: 1 .  insulin aspart (NOVOLOG FLEXPEN) 100 UNIT/ML FlexPen, 0-20 Units, Subcutaneous, 3  times daily with meals CBG < 70: implement hypoglycemia protocol CBG 70 - 120: 0 units CBG 121 - 150: 3 units CBG 151 - 200: 4 units CBG 201 - 250: 7 units CBG 251 - 300: 11 units CBG 301 - 350: 15 units CBG 351 - 400: 20 units CBG > 400: call MD (Patient not taking: Reported on 08/12/2017), Disp: 15 mL, Rfl: 0 .  Insulin Pen Needle 32G X 8 MM MISC, Use as directed, Disp: 100 each, Rfl: 0 .  Lancets (FREESTYLE) lancets, Use as instructed, Disp: 100 each, Rfl: 0 .  lidocaine (LIDODERM) 5 %, Place 1 patch onto the skin daily.  Remove & Discard patch within 12 hours or as directed by MD (Patient not taking: Reported on 07/21/2017), Disp: 30 patch, Rfl: 0 .  methocarbamol (ROBAXIN) 500 MG tablet, Take 1 tablet (500 mg total) by mouth every 8 (eight) hours as needed for muscle spasms. (Patient not taking: Reported on 07/28/2017), Disp: 20 tablet, Rfl: 0 .  NARCAN 4 MG/0.1ML LIQD nasal spray kit, U UTD, Disp: , Rfl: 0 .  Oxycodone HCl 10 MG TABS, Take 10 mg by mouth 3 (three) times daily., Disp: , Rfl: 0 .  potassium chloride 20 MEQ TBCR, Take 40 mEq by mouth daily. (Patient not taking: Reported on 07/21/2017), Disp: 60 tablet, Rfl: 3 .  spironolactone (ALDACTONE) 25 MG tablet, TAKE 1 TABLET(25 MG) BY MOUTH DAILY (Patient not taking: Reported on 07/21/2017), Disp: 30 tablet, Rfl: 3 Allergies  Allergen Reactions  . Morphine And Related Nausea And Vomiting and Other (See Comments)    Severe nausea      Social History   Socioeconomic History  . Marital status: Single    Spouse name: Not on file  . Number of children: Not on file  . Years of education: Not on file  . Highest education level: Not on file  Occupational History  . Not on file  Social Needs  . Financial resource strain: Not on file  . Food insecurity:    Worry: Not on file    Inability: Not on file  . Transportation needs:    Medical: Not on file    Non-medical: Not on file  Tobacco Use  . Smoking status: Former Smoker    Packs/day: 1.00    Years: 40.00    Pack years: 40.00    Types: Cigarettes    Start date: 06/23/1972    Last attempt to quit: 03/26/2012    Years since quitting: 5.3  . Smokeless tobacco: Never Used  Substance and Sexual Activity  . Alcohol use: No  . Drug use: No  . Sexual activity: Yes  Lifestyle  . Physical activity:    Days per week: Not on file    Minutes per session: Not on file  . Stress: Not on file  Relationships  . Social connections:    Talks on phone: Not on file    Gets together: Not on file    Attends  religious service: Not on file    Active member of club or organization: Not on file    Attends meetings of clubs or organizations: Not on file    Relationship status: Not on file  . Intimate partner violence:    Fear of current or ex partner: Not on file    Emotionally abused: Not on file    Physically abused: Not on file    Forced sexual activity: Not on file  Other Topics Concern  . Not  on file  Social History Narrative  . Not on file    Physical Exam  Constitutional: She is oriented to person, place, and time.  Cardiovascular: Regular rhythm.  Pulmonary/Chest: Effort normal and breath sounds normal.  Abdominal: Soft.  Musculoskeletal: Normal range of motion. She exhibits no edema.  Neurological: She is alert and oriented to person, place, and time.  Skin: Skin is warm and dry.  Psychiatric: She has a normal mood and affect.        No future appointments.  BP 140/90 (BP Location: Right Arm, Patient Position: Sitting, Cuff Size: Large)   Pulse (!) 110   Resp 16   Wt 222 lb 12.8 oz (101.1 kg)   SpO2 98%   BMI 34.90 kg/m   Weight yesterday- did not weigh Last visit weight- 218.3 lb  Ms Jakubek was seen at home today and reported feeling well with the exception of her chronic leg pain. She asked if there was anything I could do for her pain so I explained that I could only help her with regards to her heart failure. Upon filling her pillbox, I noted that she had missed 3 days over the past two weeks. She initially denied this, pointing out that the days remaining were Wednesday through Saturday. I pointed out that if she had been taking them as directed, she should have run out of pills in her box today. She responded by saying that she may have missed a few days. She is not weighing daily despite my encouragement to do so. I reiterated the importance of daily weights again. Her medications were verified and her pillbox was refilled.   [name],  EMT 08/12/17  ACTION: Home visit completed Next visit planned for 1 week

## 2017-08-14 ENCOUNTER — Other Ambulatory Visit: Payer: Self-pay

## 2017-08-14 ENCOUNTER — Emergency Department (HOSPITAL_COMMUNITY)
Admission: EM | Admit: 2017-08-14 | Discharge: 2017-08-14 | Disposition: A | Discharge status: Home / Self Care | Payer: Medicare Other | Attending: Emergency Medicine | Admitting: Emergency Medicine

## 2017-08-14 DIAGNOSIS — Z95810 Presence of automatic (implantable) cardiac defibrillator: Secondary | ICD-10-CM | POA: Diagnosis not present

## 2017-08-14 DIAGNOSIS — T887XXA Unspecified adverse effect of drug or medicament, initial encounter: Secondary | ICD-10-CM | POA: Diagnosis not present

## 2017-08-14 DIAGNOSIS — Y658 Other specified misadventures during surgical and medical care: Secondary | ICD-10-CM | POA: Insufficient documentation

## 2017-08-14 DIAGNOSIS — Z7901 Long term (current) use of anticoagulants: Secondary | ICD-10-CM | POA: Diagnosis not present

## 2017-08-14 DIAGNOSIS — Z96653 Presence of artificial knee joint, bilateral: Secondary | ICD-10-CM | POA: Insufficient documentation

## 2017-08-14 DIAGNOSIS — T50905A Adverse effect of unspecified drugs, medicaments and biological substances, initial encounter: Secondary | ICD-10-CM | POA: Diagnosis not present

## 2017-08-14 DIAGNOSIS — Z87891 Personal history of nicotine dependence: Secondary | ICD-10-CM | POA: Diagnosis not present

## 2017-08-14 DIAGNOSIS — R110 Nausea: Secondary | ICD-10-CM | POA: Diagnosis not present

## 2017-08-14 DIAGNOSIS — I110 Hypertensive heart disease with heart failure: Secondary | ICD-10-CM | POA: Diagnosis not present

## 2017-08-14 DIAGNOSIS — Z96643 Presence of artificial hip joint, bilateral: Secondary | ICD-10-CM | POA: Diagnosis not present

## 2017-08-14 DIAGNOSIS — Z79899 Other long term (current) drug therapy: Secondary | ICD-10-CM | POA: Insufficient documentation

## 2017-08-14 DIAGNOSIS — Z794 Long term (current) use of insulin: Secondary | ICD-10-CM | POA: Diagnosis not present

## 2017-08-14 DIAGNOSIS — E119 Type 2 diabetes mellitus without complications: Secondary | ICD-10-CM | POA: Diagnosis not present

## 2017-08-14 DIAGNOSIS — I5022 Chronic systolic (congestive) heart failure: Secondary | ICD-10-CM | POA: Insufficient documentation

## 2017-08-14 DIAGNOSIS — I10 Essential (primary) hypertension: Secondary | ICD-10-CM

## 2017-08-14 LAB — BASIC METABOLIC PANEL
Anion gap: 7 (ref 5–15)
BUN: 9 mg/dL (ref 8–23)
CO2: 30 mmol/L (ref 22–32)
Calcium: 9.3 mg/dL (ref 8.9–10.3)
Chloride: 102 mmol/L (ref 98–111)
Creatinine, Ser: 0.87 mg/dL (ref 0.44–1.00)
GFR calc Af Amer: >60 mL/min (ref >60)
GFR calc non Af Amer: >60 mL/min (ref >60)
Glucose, Bld: 213 mg/dL — ABNORMAL HIGH (ref 70–99)
Potassium: 3.8 mmol/L (ref 3.5–5.1)
Sodium: 139 mmol/L (ref 135–145)

## 2017-08-14 LAB — CBG MONITORING, ED: Glucose-Capillary: 223 mg/dL — ABNORMAL HIGH (ref 70–99)

## 2017-08-14 LAB — CBC
HCT: 35.9 % — ABNORMAL LOW (ref 36.0–46.0)
Hemoglobin: 12.2 g/dL (ref 12.0–15.0)
MCH: 30.3 pg (ref 26.0–34.0)
MCHC: 34 g/dL (ref 30.0–36.0)
MCV: 89.3 fL (ref 78.0–100.0)
Platelets: 379 10*3/uL (ref 150–400)
RBC: 4.02 MIL/uL (ref 3.87–5.11)
RDW: 12.1 % (ref 11.5–15.5)
WBC: 8.5 10*3/uL (ref 4.0–10.5)

## 2017-08-14 LAB — URINALYSIS, ROUTINE W REFLEX MICROSCOPIC
Bilirubin Urine: NEGATIVE
Glucose, UA: >=500 mg/dL — AB
Ketones, ur: 5 mg/dL — AB
Leukocytes, UA: NEGATIVE
Nitrite: NEGATIVE
Protein, ur: NEGATIVE mg/dL
Specific Gravity, Urine: 1.023 (ref 1.005–1.030)
pH: 5 (ref 5.0–8.0)

## 2017-08-14 LAB — I-STAT TROPONIN, ED: Troponin i, poc: 0 ng/mL (ref 0.00–0.08)

## 2017-08-14 MED ORDER — LABETALOL HCL 5 MG/ML IV SOLN
10.0000 mg | Freq: Once | INTRAVENOUS | Status: AC
  Administered 2017-08-14: 10 mg via INTRAVENOUS
  Filled 2017-08-14: qty 4

## 2017-08-14 MED ORDER — ONDANSETRON HCL 4 MG/2ML IJ SOLN
4.0000 mg | Freq: Once | INTRAMUSCULAR | Status: AC
  Administered 2017-08-14: 4 mg via INTRAVENOUS
  Filled 2017-08-14: qty 2

## 2017-08-14 MED ORDER — HYDROXYZINE HCL 25 MG PO TABS
25.0000 mg | ORAL_TABLET | Freq: Once | ORAL | Status: AC
  Administered 2017-08-14: 25 mg via ORAL
  Filled 2017-08-14: qty 1

## 2017-08-14 MED ORDER — ONDANSETRON HCL 4 MG PO TABS: 4.0000 mg | ORAL_TABLET | Freq: Three times a day (TID) | ORAL | 0 refills | Status: DC | PRN

## 2017-08-14 NOTE — ED Notes (Signed)
CBG collected by this tech. Result was "223." RN, Baldo Ash, notified.

## 2017-08-14 NOTE — ED Triage Notes (Signed)
Patient c/o weakness and N/V since last night when taking new medication. Hypertensive upon EMS arrival at 199/78.

## 2017-08-14 NOTE — ED Provider Notes (Signed)
[facility] EMERGENCY DEPARTMENT Provider Note   CSN: [id number] Arrival date & time: 08/14/17  1022     History   Chief Complaint Chief Complaint  Patient presents with  . Weakness  . Nausea    HPI TREASURE [facility] is a 67 y.o. female.  The history is provided by the patient. No language interpreter was used.  Weakness      67 year old female with history of CAD, chronic bronchitis, anemia, chronic back pain, diabetes brought here via EMS from home for evaluation of medication side effect.  Patient has chronic back pain which he follows with a pain clinic.  Yesterday she had her medication changed to a new medication, Xtampza (oxycodone).  She took the medication around 10 PM last night and shortly afterwards she developed side effects from the medication including nausea, vomiting, and uneasiness.  Symptom has been persistent since.  She is here requesting help with her symptoms.  Patient was also found to be hypertensive with systolic blood pressure above 200.  She did not take her morning medication.  No complaints of fever, headache, but does endorse chronic low back pain and right leg pain.  Past Medical History:  Diagnosis Date  . Anemia    a. Noted on 07/2012 labs, instructed to f/u PCP.  Marland Kitchen Arthritis    "joints" (11/18/2012)  . CAD (coronary artery disease), native coronary artery    a. Nonobstructive by cath 02/2012 (done because of low EF).  . Chronic bronchitis ([employer])    "~ every other year" (11/18/2012)  . Chronic combined systolic and diastolic CHF (congestive heart failure) ([place])    a. 03/05/12 echo:  LVEF 20-25%, moderate LVH , inferior and basal to mid septal akinesis, anterior moderate to severe hypokinesis and grade 2 diastolic dysfunction. b. EF 07/2012: EF still 25% (unclear medication compliance).  . Chronic lower back pain   . Headache(784.0)    "often; maybe not daily" (11/18/2012)  . High cholesterol   . History of noncompliance with  medical treatment   . Hypertension   . LBBB (left bundle branch block)   . Orthopnea   . Tobacco abuse   . Type II diabetes mellitus [facility])     Patient Active Problem List   Diagnosis Date Noted  . Atrial fibrillation ([place]) 02/11/2017  . Paroxysmal A-fib ([place])   . Biventricular automatic implantable cardioverter defibrillator in situ   . Sepsis ([place]) 04/20/2016  . UTI (urinary tract infection) 04/20/2016  . Dyspnea 07/19/2015  . Interstitial lung disease ([place]) 11/20/2014  . SIRS (systemic inflammatory response syndrome) ([place]) 02/03/2014  . IDDM (insulin dependent diabetes mellitus) ([place]) 02/03/2014  . Tachycardia 06/14/2013  . Chronic systolic CHF (congestive heart failure) ([place]) 09/29/2012  . Hypoxia 03/06/2012  . Hypertensive heart disease 03/06/2012  . Nonischemic cardiomyopathy ([place]) 03/06/2012  . Tobacco abuse 03/06/2012  . Type 2 diabetes mellitus ([place]) 03/06/2012  . Hypertension 03/06/2012  . CAD (coronary artery disease), native coronary artery 03/06/2012  . Hypokalemia 03/06/2012  . Acute bronchitis 02/01/2009  . Sleep apnea 02/01/2009  . Chest pain 02/01/2009    Past Surgical History:  Procedure Laterality Date  . BI-VENTRICULAR IMPLANTABLE CARDIOVERTER DEFIBRILLATOR N/A 11/18/2012   Procedure: BI-VENTRICULAR IMPLANTABLE CARDIOVERTER DEFIBRILLATOR  (CRT-D);  Surgeon: Evans Lance, MD;  Location: [facility] CATH LAB;  Service: Cardiovascular;  Laterality: N/A;  . BI-VENTRICULAR IMPLANTABLE CARDIOVERTER DEFIBRILLATOR  (CRT-D)  11/18/2012  . CARDIAC CATHETERIZATION  03/04/12   nonobstructive CAD, elevated LVEDP and tortuous vessels suggestive  of long-standing hypertension  . COLONOSCOPY WITH PROPOFOL N/A 12/12/2016   Procedure: COLONOSCOPY WITH PROPOFOL;  Surgeon: Carol Ada, MD;  Location: [facility] ENDOSCOPY;  Service: Endoscopy;  Laterality: N/A;  . JOINT REPLACEMENT     Bilateral hip and right knee  . LEFT HEART CATH N/A 03/05/2012   Procedure: LEFT HEART CATH;  Surgeon:  Larey Dresser, MD;  Location: [facility] CATH LAB;  Service: Cardiovascular;  Laterality: N/A;     OB History   None      Home Medications    Prior to Admission medications   Medication Sig Start Date End Date Taking? Authorizing Provider  acetaminophen (TYLENOL) 500 MG tablet Take 1 tablet (500 mg total) by mouth every 6 (six) hours as needed. Patient taking differently: Take 500 mg by mouth every 6 (six) hours as needed for mild pain.  09/15/15  Yes Leo Grosser, MD  albuterol (PROVENTIL HFA;VENTOLIN HFA) 108 (90 Base) MCG/ACT inhaler Inhale 2 puffs into the lungs every 6 (six) hours as needed for wheezing or shortness of breath. 04/25/16  Yes Ghimire, Henreitta Leber, MD  apixaban (ELIQUIS) 5 MG TABS tablet Take 1 tablet (5 mg total) by mouth 2 (two) times daily. 05/01/17  Yes Larey Dresser, MD  atorvastatin (LIPITOR) 40 MG tablet Take 1 tablet (40 mg total) by mouth daily. 06/17/17  Yes Larey Dresser, MD  carvedilol (COREG) 25 MG tablet Take 1 tablet (25 mg total) by mouth 2 (two) times daily with a meal. 03/17/17  Yes Tillery, Satira Mccallum, PA-C  diclofenac sodium (VOLTAREN) 1 % GEL Apply 4 g topically 4 (four) times daily. Patient taking differently: Apply 4 g topically daily as needed.  07/26/17  Yes Duffy Bruce, MD  ENTRESTO 97-103 MG TAKE 1 TABLET BY MOUTH TWICE DAILY 06/17/17  Yes Bensimhon, Shaune Pascal, MD  furosemide (LASIX) 40 MG tablet Take 1 tablet (40 mg total) by mouth 2 (two) times daily. 05/19/17  Yes Trenity Friar, PA-C  gabapentin (NEURONTIN) 100 MG capsule Take 100 mg at bedtime 06/03/17  Yes [provider]  insulin aspart (NOVOLOG FLEXPEN) 100 UNIT/ML FlexPen 0-20 Units, Subcutaneous, 3 times daily with meals CBG < 70: implement hypoglycemia protocol CBG 70 - 120: 0 units CBG 121 - 150: 3 units CBG 151 - 200: 4 units CBG 201 - 250: 7 units CBG 251 - 300: 11 units CBG 301 - 350: 15 units CBG 351 - 400: 20 units CBG > 400: call MD 04/25/16  Yes Ghimire,  Henreitta Leber, MD  Insulin Glargine (LANTUS SOLOSTAR) 100 UNIT/ML Solostar Pen Inject 50 Units into the skin daily at 10 pm.   Yes [provider]  metFORMIN (GLUCOPHAGE) 1000 MG tablet TAKE 1 TABLET(1000 MG) BY MOUTH TWICE DAILY WITH A MEAL 05/30/16  Yes Atonya Friar, PA-C  naproxen sodium (ALEVE) 220 MG tablet Take 220 mg daily as needed by mouth (pain).   Yes [provider]  NARCAN 4 MG/0.1ML LIQD nasal spray kit U UTD 04/24/17  Yes [provider]  potassium chloride 20 MEQ TBCR Take 40 mEq by mouth daily. Patient taking differently: Take 20 mEq by mouth daily.  10/07/16  Yes Arbutus Leas, NP  spironolactone (ALDACTONE) 25 MG tablet Take 1 tablet (25 mg total) by mouth daily. 05/22/16  Yes Clegg, Amy D, NP  XTAMPZA ER 9 MG C12A Take 9 mg by mouth 2 (two) times daily.  08/13/17  Yes [provider]  glucose blood (FREESTYLE TEST STRIPS) test  strip Use as instructed 04/25/16   Ghimire, Henreitta Leber, MD  glucose monitoring kit (FREESTYLE) monitoring kit 1 each by Does not apply route 4 (four) times daily - after meals and at bedtime. 1 month Diabetic Testing Supplies for QAC-QHS accuchecks. 04/25/16   Ghimire, Henreitta Leber, MD  HYDROcodone-acetaminophen (NORCO) 7.5-325 MG tablet Take 1 tablet by mouth 3 (three) times daily as needed. 08/13/17   [provider]  Insulin Pen Needle 32G X 8 MM MISC Use as directed 06/18/16   Arbutus Leas, NP  isosorbide-hydrALAZINE (BIDIL) 20-37.5 MG tablet Take 2 tablets by mouth 3 (three) times daily. Patient not taking: Reported on 08/14/2017 09/11/16   Arbutus Leas, NP  Lancets (FREESTYLE) lancets Use as instructed 06/18/16   Arbutus Leas, NP  lidocaine (LIDODERM) 5 % Place 1 patch onto the skin daily. Remove & Discard patch within 12 hours or as directed by MD Patient not taking: Reported on 07/21/2017 06/28/17   Petrucelli, Glynda Jaeger, PA-C  methocarbamol (ROBAXIN) 500 MG tablet Take 1 tablet (500 mg total) by mouth every 8  (eight) hours as needed for muscle spasms. Patient not taking: Reported on 07/28/2017 07/26/17   Duffy Bruce, MD  spironolactone (ALDACTONE) 25 MG tablet TAKE 1 TABLET(25 MG) BY MOUTH DAILY Patient not taking: Reported on 07/21/2017 05/01/17   Bensimhon, Shaune Pascal, MD    Family History Family History  Problem Relation Age of Onset  . Heart disease Neg Hx     Social History Social History   Tobacco Use  . Smoking status: Former Smoker    Packs/day: 1.00    Years: 40.00    Pack years: 40.00    Types: Cigarettes    Start date: 06/23/1972    Last attempt to quit: 03/26/2012    Years since quitting: 5.3  . Smokeless tobacco: Never Used  Substance Use Topics  . Alcohol use: No  . Drug use: No     Allergies   Morphine and related   Review of Systems Review of Systems  Neurological: Positive for weakness.  All other systems reviewed and are negative.    Physical Exam Updated Vital Signs BP (!) 207/95 (BP Location: Left Arm)   Pulse 91   Temp 98.7 F (37.1 C) (Oral)   Resp 10   Ht '5\' 7"'  (1.702 m)   Wt 100.2 kg (221 lb)   SpO2 97%   BMI 34.61 kg/m   Physical Exam  Constitutional: She appears well-developed and well-nourished. No distress.  HENT:  Head: Atraumatic.  Eyes: Conjunctivae are normal.  Neck: Neck supple.  Cardiovascular: Normal rate and regular rhythm.  Pulmonary/Chest: Effort normal and breath sounds normal.  Abdominal: Soft. Bowel sounds are normal. She exhibits no distension. There is no tenderness.  Musculoskeletal: She exhibits tenderness (Tenderness along lumbar spine on palpation).  Neurological: She is alert.  Skin: No rash noted.  Psychiatric: She has a normal mood and affect.  Nursing note and vitals reviewed.    ED Treatments / Results  Labs (all labs ordered are listed, but only abnormal results are displayed) Labs Reviewed  URINALYSIS, ROUTINE W REFLEX MICROSCOPIC - Abnormal; Notable for the following components:      Result Value     APPearance CLOUDY (*)    Glucose, UA >=500 (*)    Hgb urine dipstick SMALL (*)    Ketones, ur 5 (*)    Bacteria, UA RARE (*)    All other components within normal limits  CBG MONITORING,  ED - Abnormal; Notable for the following components:   Glucose-Capillary 223 (*)    All other components within normal limits  BASIC METABOLIC PANEL  CBC  I-STAT TROPONIN, ED  I-STAT TROPONIN, ED    EKG EKG Interpretation  Date/Time:  Friday August 14 2017 10:31:49 EDT Ventricular Rate:  89 PR Interval:    QRS Duration: 120 QT Interval:  407 QTC Calculation: 496 R Axis:   74 Text Interpretation:  Sinus rhythm Nonspecific intraventricular conduction delay Non-specific ST-t changes Confirmed by Lajean Saver [phone]) on 08/14/2017 10:58:23 AM   Radiology No results found.  Procedures Procedures (including critical care time)  Medications Ordered in ED Medications  ondansetron (ZOFRAN) injection 4 mg (4 mg Intravenous Given 08/14/17 1128)  hydrOXYzine (ATARAX/VISTARIL) tablet 25 mg (25 mg Oral Given 08/14/17 1128)  labetalol (NORMODYNE,TRANDATE) injection 10 mg (10 mg Intravenous Given 08/14/17 1155)  labetalol (NORMODYNE,TRANDATE) injection 10 mg (10 mg Intravenous Given 08/14/17 1530)     Initial Impression / Assessment and Plan / ED Course  I have reviewed the triage vital signs and the nursing notes.  Pertinent labs & imaging results that were available during my care of the patient were reviewed by me and considered in my medical decision making (see chart for details).     BP (!) 172/105   Pulse 77   Temp 98.7 F (37.1 C) (Oral)   Resp 16   Ht '5\' 7"'  (1.702 m)   Wt 100.2 kg (221 lb)   SpO2 100%   BMI 34.61 kg/m    Final Clinical Impressions(s) / ED Diagnoses   Final diagnoses:  Adverse effect of drug, initial encounter  Essential hypertension    ED Discharge Orders        Ordered    ondansetron (ZOFRAN) 4 MG tablet  Every 8 hours PRN     08/14/17 1549     11:22  AM Patient here with what appears to be adverse side effect after she was started on a new pain medication, Xtampza.  This is a variance of oxycodone indicate for the management of pain severe enough to require daily around-the-clock long-term opioid treatment.  3:51 PM Pt received symptomatic treatment for her adverse reaction and she felt better.  She was found to be hypertensive, did not take her morning medication.  Pt received labetalol with improvement of her BP.  Encourage pt to take her BP medication and have it recheck by PCP.  Pt to d/c her new opioid med and f/u with her pain specialist for further care.     Domenic Moras, PA-C 08/14/17 1554    Lajean Saver, MD 08/17/17 [phone]

## 2017-08-14 NOTE — Discharge Instructions (Signed)
Your symptoms are likely an adverse reaction to your recent medication.  Discontinue taking this medication and follow up closely with your pain specialist for further management of your pain.  Your blood pressure is high today.  Take your blood pressure medication and follow up with your doctor next week for recheck.

## 2017-08-27 ENCOUNTER — Telehealth (HOSPITAL_COMMUNITY): Payer: Self-pay

## 2017-08-27 NOTE — Telephone Encounter (Signed)
I called [name] to schedule an appointment. She stated that she would be busy today and tomorrow and asked if I could contact her sometime next week. I asked he about her pillboxes and she said they "still have some in there." I pointed out that I have not seen her in two weeks so she should be nearly out. She became defensive and advised that she knows how to fill her pillbox and refilled it on her own. I will contact her next week and discuss discharge since she feels confident in handling her medications.

## 2017-08-31 ENCOUNTER — Emergency Department (HOSPITAL_COMMUNITY)
Admission: EM | Admit: 2017-08-31 | Discharge: 2017-08-31 | Disposition: A | Discharge status: Home / Self Care | Payer: Medicare Other | Attending: Emergency Medicine | Admitting: Emergency Medicine

## 2017-08-31 ENCOUNTER — Encounter (HOSPITAL_COMMUNITY): Payer: Self-pay | Admitting: Emergency Medicine

## 2017-08-31 ENCOUNTER — Emergency Department (HOSPITAL_COMMUNITY): Payer: Medicare Other

## 2017-08-31 ENCOUNTER — Other Ambulatory Visit: Payer: Self-pay

## 2017-08-31 DIAGNOSIS — I110 Hypertensive heart disease with heart failure: Secondary | ICD-10-CM | POA: Diagnosis not present

## 2017-08-31 DIAGNOSIS — Z7984 Long term (current) use of oral hypoglycemic drugs: Secondary | ICD-10-CM | POA: Insufficient documentation

## 2017-08-31 DIAGNOSIS — Z79899 Other long term (current) drug therapy: Secondary | ICD-10-CM | POA: Insufficient documentation

## 2017-08-31 DIAGNOSIS — E119 Type 2 diabetes mellitus without complications: Secondary | ICD-10-CM | POA: Insufficient documentation

## 2017-08-31 DIAGNOSIS — Z87891 Personal history of nicotine dependence: Secondary | ICD-10-CM | POA: Insufficient documentation

## 2017-08-31 DIAGNOSIS — R0789 Other chest pain: Secondary | ICD-10-CM

## 2017-08-31 DIAGNOSIS — I5022 Chronic systolic (congestive) heart failure: Secondary | ICD-10-CM | POA: Diagnosis not present

## 2017-08-31 DIAGNOSIS — I2510 Atherosclerotic heart disease of native coronary artery without angina pectoris: Secondary | ICD-10-CM | POA: Insufficient documentation

## 2017-08-31 LAB — CBC WITH DIFFERENTIAL/PLATELET
Abs Immature Granulocytes: 0 10*3/uL (ref 0.0–0.1)
Basophils Absolute: 0.1 10*3/uL (ref 0.0–0.1)
Basophils Relative: 1 %
Eosinophils Absolute: 0.2 10*3/uL (ref 0.0–0.7)
Eosinophils Relative: 2 %
HCT: 35.7 % — ABNORMAL LOW (ref 36.0–46.0)
Hemoglobin: 12 g/dL (ref 12.0–15.0)
Immature Granulocytes: 0 %
Lymphocytes Relative: 22 %
Lymphs Abs: 2 10*3/uL (ref 0.7–4.0)
MCH: 30.2 pg (ref 26.0–34.0)
MCHC: 33.6 g/dL (ref 30.0–36.0)
MCV: 89.9 fL (ref 78.0–100.0)
Monocytes Absolute: 0.6 10*3/uL (ref 0.1–1.0)
Monocytes Relative: 7 %
Neutro Abs: 6.2 10*3/uL (ref 1.7–7.7)
Neutrophils Relative %: 68 %
Platelets: ADEQUATE 10*3/uL (ref 150–400)
RBC: 3.97 MIL/uL (ref 3.87–5.11)
RDW: 12.3 % (ref 11.5–15.5)
WBC: 9.1 10*3/uL (ref 4.0–10.5)

## 2017-08-31 LAB — I-STAT TROPONIN, ED
Troponin i, poc: 0 ng/mL (ref 0.00–0.08)
Troponin i, poc: 0.02 ng/mL (ref 0.00–0.08)

## 2017-08-31 LAB — BASIC METABOLIC PANEL
Anion gap: 7 (ref 5–15)
BUN: 12 mg/dL (ref 8–23)
CO2: 30 mmol/L (ref 22–32)
Calcium: 9.6 mg/dL (ref 8.9–10.3)
Chloride: 103 mmol/L (ref 98–111)
Creatinine, Ser: 0.9 mg/dL (ref 0.44–1.00)
GFR calc Af Amer: >60 mL/min (ref >60)
GFR calc non Af Amer: >60 mL/min (ref >60)
Glucose, Bld: 254 mg/dL — ABNORMAL HIGH (ref 70–99)
Potassium: 4.3 mmol/L (ref 3.5–5.1)
Sodium: 140 mmol/L (ref 135–145)

## 2017-08-31 LAB — BRAIN NATRIURETIC PEPTIDE: B Natriuretic Peptide: 620.1 pg/mL — ABNORMAL HIGH (ref 0.0–100.0)

## 2017-08-31 NOTE — ED Notes (Signed)
Pt to xray at this time.

## 2017-08-31 NOTE — ED Triage Notes (Signed)
Pt to ED via [facility] from a game room where she was playing the machines. Pt st's she had sudden onset of sharp chest pain that lasted a short period of time with shortness of breath.  Pt also vomited x's 1. Pt was given ASA 324mg  and Zofran 4mg  IV by EMS    Pt also hypertensive and st's she did take her blood pressure meds this am.  Pt became very upset with her children after arriving in ED via telephone.

## 2017-08-31 NOTE — ED Notes (Signed)
Pt departed in NAD.  

## 2017-08-31 NOTE — ED Provider Notes (Signed)
[facility] EMERGENCY DEPARTMENT Provider Note   CSN: [id number] Arrival date & time: 08/31/17  1816  History   Chief Complaint Chief Complaint  Patient presents with  . Chest Pain     HPI [name] is a 67 y.o. female.  HPI Patient is a 67-year female with history of CAD, nonischemic cardiomyopathy, CHF with BiV IVD, chronic low back pain, hypertension, hyperlipidemia, diabetes and obesity who presents to the emergency department today for evaluation of chest pain.  Patient reports that she was playing video games today where she developed sharp pain in the center of her chest.  States the pain eventually subsided on its own.  Did have one episode of nonbloody/nonbilious emesis with EMS prior to arrival.  Was given aspirin and Zofran by EMS.  Patient reports no pain in her chest here in the emergency department.  Chronic back pain for which she has MRI scheduled.  No fevers or chills.  No cough.  No shortness of breath.  No leg swelling.  Not on any anticoagulants.  Denies history of DVT or PE.  Past Medical History:  Diagnosis Date  . Anemia    a. Noted on 07/2012 labs, instructed to f/u PCP.  Marland Kitchen Arthritis    "joints" (11/18/2012)  . CAD (coronary artery disease), native coronary artery    a. Nonobstructive by cath 02/2012 (done because of low EF).  . Chronic bronchitis ([place])    "~ every other year" (11/18/2012)  . Chronic combined systolic and diastolic CHF (congestive heart failure) ([place])    a. 03/05/12 echo:  LVEF 20-25%, moderate LVH , inferior and basal to mid septal akinesis, anterior moderate to severe hypokinesis and grade 2 diastolic dysfunction. b. EF 07/2012: EF still 25% (unclear medication compliance).  . Chronic lower back pain   . Headache(784.0)    "often; maybe not daily" (11/18/2012)  . High cholesterol   . History of noncompliance with medical treatment   . Hypertension   . LBBB (left bundle branch block)   . Orthopnea   . Tobacco abuse   .  Type II diabetes mellitus [place])     Patient Active Problem List   Diagnosis Date Noted  . Atrial fibrillation ([place]) 02/11/2017  . Paroxysmal A-fib ([place])   . Biventricular automatic implantable cardioverter defibrillator in situ   . Sepsis ([place]) 04/20/2016  . UTI (urinary tract infection) 04/20/2016  . Dyspnea 07/19/2015  . Interstitial lung disease ([place]) 11/20/2014  . SIRS (systemic inflammatory response syndrome) ([place]) 02/03/2014  . IDDM (insulin dependent diabetes mellitus) ([place]) 02/03/2014  . Tachycardia 06/14/2013  . Chronic systolic CHF (congestive heart failure) ([place]) 09/29/2012  . Hypoxia 03/06/2012  . Hypertensive heart disease 03/06/2012  . Nonischemic cardiomyopathy ([place]) 03/06/2012  . Tobacco abuse 03/06/2012  . Type 2 diabetes mellitus ([place]) 03/06/2012  . Hypertension 03/06/2012  . CAD (coronary artery disease), native coronary artery 03/06/2012  . Hypokalemia 03/06/2012  . Acute bronchitis 02/01/2009  . Sleep apnea 02/01/2009  . Chest pain 02/01/2009    Past Surgical History:  Procedure Laterality Date  . BI-VENTRICULAR IMPLANTABLE CARDIOVERTER DEFIBRILLATOR N/A 11/18/2012   Procedure: BI-VENTRICULAR IMPLANTABLE CARDIOVERTER DEFIBRILLATOR  (CRT-D);  Surgeon: Evans Lance, MD;  Location: [facility] CATH LAB;  Service: Cardiovascular;  Laterality: N/A;  . BI-VENTRICULAR IMPLANTABLE CARDIOVERTER DEFIBRILLATOR  (CRT-D)  11/18/2012  . CARDIAC CATHETERIZATION  03/04/12   nonobstructive CAD, elevated LVEDP and tortuous vessels suggestive of long-standing hypertension  . COLONOSCOPY WITH PROPOFOL N/A 12/12/2016   Procedure:  COLONOSCOPY WITH PROPOFOL;  Surgeon: Carol Ada, MD;  Location: [facility] ENDOSCOPY;  Service: Endoscopy;  Laterality: N/A;  . JOINT REPLACEMENT     Bilateral hip and right knee  . LEFT HEART CATH N/A 03/05/2012   Procedure: LEFT HEART CATH;  Surgeon: Larey Dresser, MD;  Location: [facility] CATH LAB;  Service: Cardiovascular;  Laterality: N/A;     OB History    None      Home Medications    Prior to Admission medications   Medication Sig Start Date End Date Taking? Authorizing Provider  albuterol (PROVENTIL HFA;VENTOLIN HFA) 108 (90 Base) MCG/ACT inhaler Inhale 2 puffs into the lungs every 6 (six) hours as needed for wheezing or shortness of breath. 04/25/16  Yes Ghimire, Henreitta Leber, MD  acetaminophen (TYLENOL) 500 MG tablet Take 1 tablet (500 mg total) by mouth every 6 (six) hours as needed. Patient not taking: Reported on 08/31/2017 09/15/15   Leo Grosser, MD  apixaban (ELIQUIS) 5 MG TABS tablet Take 1 tablet (5 mg total) by mouth 2 (two) times daily. 05/01/17   Larey Dresser, MD  atorvastatin (LIPITOR) 40 MG tablet Take 1 tablet (40 mg total) by mouth daily. 06/17/17   Larey Dresser, MD  carvedilol (COREG) 25 MG tablet Take 1 tablet (25 mg total) by mouth 2 (two) times daily with a meal. 03/17/17   Tillery, Satira Mccallum, PA-C  diclofenac sodium (VOLTAREN) 1 % GEL Apply 4 g topically 4 (four) times daily. Patient taking differently: Apply 4 g topically daily as needed.  07/26/17   Duffy Bruce, MD  ENTRESTO 97-103 MG TAKE 1 TABLET BY MOUTH TWICE DAILY 06/17/17   Bensimhon, Shaune Pascal, MD  furosemide (LASIX) 40 MG tablet Take 1 tablet (40 mg total) by mouth 2 (two) times daily. 05/19/17   Arbell Friar, PA-C  gabapentin (NEURONTIN) 100 MG capsule Take 100 mg at bedtime 06/03/17   [provider]  glucose blood (FREESTYLE TEST STRIPS) test strip Use as instructed 04/25/16   Ghimire, Henreitta Leber, MD  glucose monitoring kit (FREESTYLE) monitoring kit 1 each by Does not apply route 4 (four) times daily - after meals and at bedtime. 1 month Diabetic Testing Supplies for QAC-QHS accuchecks. 04/25/16   Ghimire, Henreitta Leber, MD  HYDROcodone-acetaminophen (NORCO) 7.5-325 MG tablet Take 1 tablet by mouth 3 (three) times daily as needed. 08/13/17   [provider]  insulin aspart (NOVOLOG FLEXPEN) 100 UNIT/ML FlexPen 0-20 Units,  Subcutaneous, 3 times daily with meals CBG < 70: implement hypoglycemia protocol CBG 70 - 120: 0 units CBG 121 - 150: 3 units CBG 151 - 200: 4 units CBG 201 - 250: 7 units CBG 251 - 300: 11 units CBG 301 - 350: 15 units CBG 351 - 400: 20 units CBG > 400: call MD 04/25/16   Jonetta Osgood, MD  Insulin Glargine (LANTUS SOLOSTAR) 100 UNIT/ML Solostar Pen Inject 50 Units into the skin daily at 10 pm.    [provider]  Insulin Pen Needle 32G X 8 MM MISC Use as directed 06/18/16   Arbutus Leas, NP  Lancets (FREESTYLE) lancets Use as instructed 06/18/16   Arbutus Leas, NP  metFORMIN (GLUCOPHAGE) 1000 MG tablet TAKE 1 TABLET(1000 MG) BY MOUTH TWICE DAILY WITH A MEAL 05/30/16   Lexia Friar, PA-C  naproxen sodium (ALEVE) 220 MG tablet Take 220 mg daily as needed by mouth (pain).    [provider]  NARCAN 4 MG/0.1ML LIQD nasal spray kit  U UTD 04/24/17   [provider]  ondansetron (ZOFRAN) 4 MG tablet Take 1 tablet (4 mg total) by mouth every 8 (eight) hours as needed for nausea or vomiting. 08/14/17   Domenic Moras, PA-C  potassium chloride 20 MEQ TBCR Take 40 mEq by mouth daily. Patient taking differently: Take 20 mEq by mouth daily.  10/07/16   Arbutus Leas, NP  spironolactone (ALDACTONE) 25 MG tablet Take 1 tablet (25 mg total) by mouth daily. 05/22/16   Conrad Bealeton, NP    Family History Family History  Problem Relation Age of Onset  . Heart disease Neg Hx     Social History Social History   Tobacco Use  . Smoking status: Former Smoker    Packs/day: 1.00    Years: 40.00    Pack years: 40.00    Types: Cigarettes    Start date: 06/23/1972    Last attempt to quit: 03/26/2012    Years since quitting: 5.4  . Smokeless tobacco: Never Used  Substance Use Topics  . Alcohol use: No  . Drug use: No     Allergies   Morphine and related and Penicillins   Review of Systems Review of Systems  Constitutional: Negative for chills and fever.  HENT:  Negative for congestion and sore throat.   Eyes: Negative for visual disturbance.  Respiratory: Negative for cough and shortness of breath.   Cardiovascular: Positive for chest pain. Negative for leg swelling.  Gastrointestinal: Positive for nausea and vomiting. Negative for abdominal pain and diarrhea.  Genitourinary: Negative for dysuria and hematuria.  Musculoskeletal: Negative for neck pain. Back pain: chronic   Skin: Negative for color change, rash and wound.  Neurological: Negative for weakness and headaches.  All other systems reviewed and are negative.    Physical Exam Updated Vital Signs BP 117/67   Pulse 96   Temp 98.3 F (36.8 C) (Oral)   Resp (!) 21   Ht _0  (1.702 m)   Wt 99.3 kg (219 lb)   SpO2 94%   BMI 34.30 kg/m   Physical Exam  Constitutional:  Non-toxic appearance. She does not appear ill. No distress.  obese  HENT:  Head: Normocephalic and atraumatic.  Eyes: Conjunctivae are normal.  Neck: Neck supple.  Cardiovascular: Regular rhythm and intact distal pulses. Exam reveals no friction rub.  No murmur heard. pacemaker  Pulmonary/Chest: Effort normal and breath sounds normal. No respiratory distress.  Abdominal: Soft. She exhibits no distension. There is no tenderness.  Musculoskeletal: She exhibits no edema.       Right lower leg: She exhibits no edema.       Left lower leg: She exhibits no edema.  Neurological: She is alert.  Skin: Skin is warm and dry. Capillary refill takes less than 2 seconds.  Psychiatric: She has a normal mood and affect.  Nursing note and vitals reviewed.    ED Treatments / Results  Labs (all labs ordered are listed, but only abnormal results are displayed) Labs Reviewed  CBC WITH DIFFERENTIAL/PLATELET - Abnormal; Notable for the following components:      Result Value   HCT 35.7 (*)    All other components within normal limits  BRAIN NATRIURETIC PEPTIDE - Abnormal; Notable for the following components:   B  Natriuretic Peptide 620.1 (*)    All other components within normal limits  BASIC METABOLIC PANEL - Abnormal; Notable for the following components:   Glucose, Bld 254 (*)    All other components  within normal limits  I-STAT TROPONIN, ED  I-STAT TROPONIN, ED    EKG EKG Interpretation  Date/Time:  Monday August 31 2017 19:32:21 EDT Ventricular Rate:  91 PR Interval:    QRS Duration: 127 QT Interval:  401 QTC Calculation: 494 R Axis:   104 Text Interpretation:  Sinus rhythm Ventricular premature complex Nonspecific intraventricular conduction delay Abnormal lateral Q waves Anterior infarct, old Borderline T abnormalities, inferior leads No acute changes Nonspecific ST and T wave abnormality Confirmed by Varney Biles [phone]) on 08/31/2017 7:39:59 PM   Radiology Dg Chest 2 View  Result Date: 08/31/2017 CLINICAL DATA:  Chest pain, shortness of breath and nausea EXAM: CHEST - 2 VIEW COMPARISON:  05/25/2017 FINDINGS: There is a leftchest wall 3 lead AICDwith leads projecting within the right atrium, right ventricle and coronary sinus. The heart size and mediastinal contours are within normal limits. Both lungs are clear. The visualized skeletal structures are unremarkable. IMPRESSION: No active cardiopulmonary disease. Electronically Signed   By: Ulyses Jarred M.D.   On: 08/31/2017 19:58    Procedures Procedures (including critical care time)  Medications Ordered in ED Medications - No data to display   Initial Impression / Assessment and Plan / ED Course  I have reviewed the triage vital signs and the nursing notes.  Pertinent labs & imaging results that were available during my care of the patient were reviewed by me and considered in my medical decision making (see chart for details).    Patient is a 66-year female with history of CAD, nonischemic cardiomyopathy, CHF with BiV IVD, chronic low back pain, hypertension, hyperlipidemia, diabetes and obesity who presents to the  emergency department today for evaluation of chest pain.   Pt afebrile, hypertensive in ED. ECG reviewed with rate approximately 90, sinus, few PVCs, normal axis, QRS slightly widened, ST depressions in leads II, III, AVF as well as TWIs in V5-V6. These findings are similar  when compared to ECG from 08/14/17. Exam atypical for ACS however will obtain delta trop for further evaluation given history of CAD. No tachycardia, hypoxia, or tachypnea to suggest PE. Chronic RLE swelling when compared to left and had negative DVT study on 6/30 and no significant change since then. Doubt PE at this time. Symmetric pulses bilat upper and lower extremities. No widening of mediastinum on CXR. Negative CTA PE study in March 2018 with no dissection or aneurysm of Aorta at that time. Chronically hypertensive with no AKI, no thrombocytopenia, and no trop elevation to suggest hypertensive emergency. Not consistent with ACS. Pt with reassuring abdominal exam in ED. No findings to suggest AAA. BNP chronically elevated and similar to prior. Pt with improvement in symptoms in ED. Resting comfortably and sleeping in ED. No further chest pain in ED. Able to ambulate to and from bathroom. Discussed close outpatient follow up plan which patient is comfortable with. Given atypical presentation, stable on RA, labs at baseline, and no CP/SOA in ED pt stable for discharge. Strict return precautions provided. Pt and numerous family members updated at bedside throughout care.   Case and plan of care discussed with Dr. Kathrynn Humble.   Final Clinical Impressions(s) / ED Diagnoses   Final diagnoses:  Atypical chest pain    ED Discharge Orders    None       Corrie Dandy, MD 08/31/17 [place], [place], MD 09/02/17 1242

## 2017-08-31 NOTE — ED Notes (Signed)
Staff attempted to draw labs, pt only wants lab work drawn from her IV.

## 2017-09-15 ENCOUNTER — Other Ambulatory Visit (HOSPITAL_COMMUNITY): Payer: Self-pay

## 2017-09-15 ENCOUNTER — Telehealth (HOSPITAL_COMMUNITY): Payer: Self-pay

## 2017-09-15 NOTE — Progress Notes (Signed)
Paramedicine Encounter    Patient ID: [name], female    DOB: September 26, 1950, 67 y.o.   MRN: [id number]   Patient Care Team: Jeanella Anton, NP as PCP - General (Nurse Practitioner)  Patient Active Problem List   Diagnosis Date Noted  . Atrial fibrillation ([place]) 02/11/2017  . Paroxysmal A-fib ([place])   . Biventricular automatic implantable cardioverter defibrillator in situ   . Sepsis ([place]) 04/20/2016  . UTI (urinary tract infection) 04/20/2016  . Dyspnea 07/19/2015  . Interstitial lung disease ([place]) 11/20/2014  . SIRS (systemic inflammatory response syndrome) ([place]) 02/03/2014  . IDDM (insulin dependent diabetes mellitus) ([place]) 02/03/2014  . Tachycardia 06/14/2013  . Chronic systolic CHF (congestive heart failure) ([place]) 09/29/2012  . Hypoxia 03/06/2012  . Hypertensive heart disease 03/06/2012  . Nonischemic cardiomyopathy ([place]) 03/06/2012  . Tobacco abuse 03/06/2012  . Type 2 diabetes mellitus ([place]) 03/06/2012  . Hypertension 03/06/2012  . CAD (coronary artery disease), native coronary artery 03/06/2012  . Hypokalemia 03/06/2012  . Acute bronchitis 02/01/2009  . Sleep apnea 02/01/2009  . Chest pain 02/01/2009    Current Outpatient Medications:  .  albuterol (PROVENTIL HFA;VENTOLIN HFA) 108 (90 Base) MCG/ACT inhaler, Inhale 2 puffs into the lungs every 6 (six) hours as needed for wheezing or shortness of breath., Disp: 18 g, Rfl: 0 .  apixaban (ELIQUIS) 5 MG TABS tablet, Take 1 tablet (5 mg total) by mouth 2 (two) times daily., Disp: 60 tablet, Rfl: 3 .  atorvastatin (LIPITOR) 40 MG tablet, Take 1 tablet (40 mg total) by mouth daily., Disp: 30 tablet, Rfl: 3 .  carvedilol (COREG) 25 MG tablet, Take 1 tablet (25 mg total) by mouth 2 (two) times daily with a meal., Disp: 60 tablet, Rfl: 11 .  diclofenac sodium (VOLTAREN) 1 % GEL, Apply 4 g topically 4 (four) times daily. (Patient taking differently: Apply 4 g topically daily as needed. ), Disp: 100 g, Rfl: 0 .  ENTRESTO 97-103  MG, TAKE 1 TABLET BY MOUTH TWICE DAILY, Disp: 60 tablet, Rfl: 6 .  furosemide (LASIX) 40 MG tablet, Take 1 tablet (40 mg total) by mouth 2 (two) times daily., Disp: 180 tablet, Rfl: 3 .  glucose blood (FREESTYLE TEST STRIPS) test strip, Use as instructed, Disp: 100 each, Rfl: 0 .  glucose monitoring kit (FREESTYLE) monitoring kit, 1 each by Does not apply route 4 (four) times daily - after meals and at bedtime. 1 month Diabetic Testing Supplies for QAC-QHS accuchecks., Disp: 1 each, Rfl: 1 .  Insulin Glargine (LANTUS SOLOSTAR) 100 UNIT/ML Solostar Pen, Inject 50 Units into the skin daily at 10 pm., Disp: , Rfl:  .  Insulin Pen Needle 32G X 8 MM MISC, Use as directed, Disp: 100 each, Rfl: 0 .  Lancets (FREESTYLE) lancets, Use as instructed, Disp: 100 each, Rfl: 0 .  metFORMIN (GLUCOPHAGE) 1000 MG tablet, TAKE 1 TABLET(1000 MG) BY MOUTH TWICE DAILY WITH A MEAL, Disp: 60 tablet, Rfl: 0 .  NARCAN 4 MG/0.1ML LIQD nasal spray kit, U UTD, Disp: , Rfl: 0 .  spironolactone (ALDACTONE) 25 MG tablet, Take 1 tablet (25 mg total) by mouth daily., Disp: 30 tablet, Rfl: 3 .  acetaminophen (TYLENOL) 500 MG tablet, Take 1 tablet (500 mg total) by mouth every 6 (six) hours as needed. (Patient not taking: Reported on 08/31/2017), Disp: 30 tablet, Rfl: 0 .  gabapentin (NEURONTIN) 100 MG capsule, Take 100 mg at bedtime, Disp: , Rfl: 0 .  HYDROcodone-acetaminophen (NORCO) 7.5-325 MG tablet, Take 1 tablet  by mouth 3 (three) times daily as needed., Disp: , Rfl: 0 .  insulin aspart (NOVOLOG FLEXPEN) 100 UNIT/ML FlexPen, 0-20 Units, Subcutaneous, 3 times daily with meals CBG < 70: implement hypoglycemia protocol CBG 70 - 120: 0 units CBG 121 - 150: 3 units CBG 151 - 200: 4 units CBG 201 - 250: 7 units CBG 251 - 300: 11 units CBG 301 - 350: 15 units CBG 351 - 400: 20 units CBG > 400: call MD (Patient not taking: Reported on 09/15/2017), Disp: 15 mL, Rfl: 0 .  naproxen sodium (ALEVE) 220 MG tablet, Take 220 mg daily as needed by  mouth (pain)., Disp: , Rfl:  .  ondansetron (ZOFRAN) 4 MG tablet, Take 1 tablet (4 mg total) by mouth every 8 (eight) hours as needed for nausea or vomiting. (Patient not taking: Reported on 09/15/2017), Disp: 12 tablet, Rfl: 0 .  potassium chloride 20 MEQ TBCR, Take 40 mEq by mouth daily. (Patient not taking: Reported on 09/15/2017), Disp: 60 tablet, Rfl: 3 Allergies  Allergen Reactions  . Morphine And Related Nausea And Vomiting and Other (See Comments)    Severe nausea  . Penicillins Other (See Comments)    Pt states she has had a pain in her leg since a penicillin injection 2 months ago (reported 08/31/17) Has patient had a PCN reaction causing immediate rash, facial/tongue/throat swelling, SOB or lightheadedness with hypotension: No Has patient had a PCN reaction causing severe rash involving mucus membranes or skin necrosis: No Has patient had a PCN reaction that required hospitalization: No Has patient had a PCN reaction occurring within the last 10 years: Yes If all of the above answers are "NO", t      Social History   Socioeconomic History  . Marital status: Single    Spouse name: Not on file  . Number of children: Not on file  . Years of education: Not on file  . Highest education level: Not on file  Occupational History  . Not on file  Social Needs  . Financial resource strain: Not on file  . Food insecurity:    Worry: Not on file    Inability: Not on file  . Transportation needs:    Medical: Not on file    Non-medical: Not on file  Tobacco Use  . Smoking status: Former Smoker    Packs/day: 1.00    Years: 40.00    Pack years: 40.00    Types: Cigarettes    Start date: 06/23/1972    Last attempt to quit: 03/26/2012    Years since quitting: 5.4  . Smokeless tobacco: Never Used  Substance and Sexual Activity  . Alcohol use: No  . Drug use: No  . Sexual activity: Yes  Lifestyle  . Physical activity:    Days per week: Not on file    Minutes per session: Not on  file  . Stress: Not on file  Relationships  . Social connections:    Talks on phone: Not on file    Gets together: Not on file    Attends religious service: Not on file    Active member of club or organization: Not on file    Attends meetings of clubs or organizations: Not on file    Relationship status: Not on file  . Intimate partner violence:    Fear of current or ex partner: Not on file    Emotionally abused: Not on file    Physically abused: Not on file  Forced sexual activity: Not on file  Other Topics Concern  . Not on file  Social History Narrative  . Not on file    Physical Exam  Constitutional: She is oriented to person, place, and time.  Pulmonary/Chest: Effort normal and breath sounds normal.  Abdominal: Soft. She exhibits no distension.  Musculoskeletal: Normal range of motion. She exhibits no edema.  Neurological: She is alert and oriented to person, place, and time.  Skin: Skin is warm and dry.  Psychiatric: She has a normal mood and affect.        No future appointments.  BP (!) 142/78 (BP Location: Right Arm, Patient Position: Sitting, Cuff Size: Large)   Pulse 80   Resp 16   Wt 227 lb (103 kg)   SpO2 96%   BMI 35.55 kg/m   Weight yesterday- Did not weigh Last visit weight- 222 lb  Ms Huisman was seen at home today and reported feeling generally well. She denied SOB, headache, dizziness or orthopnea. Her only complaint is leg pain which is a chronic issue. She advised that she has been out of medication in her pillbox for the past two days and said it was because I had not been to see her and she did not know how to refill it. This is in direct contradiction to what she expressed over the telephone on 08/27/17. She then said that she wasn't filling it lately because she hadn't been feeling well and here "eyes are messed up." There is an ongoing issue with non-compliance and she continues to frequent the ED for leg pain. I will discuss further with the  clinic staff.   [name], EMT 09/15/17  ACTION: Home visit completed Next visit planned for 2 weeks

## 2017-09-16 NOTE — Telephone Encounter (Signed)
I called [name] to schedule an appointment. She stated she would be home all day and advised I could come over immediately if I wanted.

## 2017-09-18 DIAGNOSIS — M5416 Radiculopathy, lumbar region: Secondary | ICD-10-CM | POA: Insufficient documentation

## 2017-09-18 DIAGNOSIS — M4310 Spondylolisthesis, site unspecified: Secondary | ICD-10-CM | POA: Insufficient documentation

## 2017-10-02 ENCOUNTER — Telehealth (HOSPITAL_COMMUNITY): Payer: Self-pay

## 2017-10-02 ENCOUNTER — Telehealth (HOSPITAL_COMMUNITY): Payer: Self-pay | Admitting: Surgery

## 2017-10-02 NOTE — Telephone Encounter (Signed)
I called [name] to see if she was available for an appointment. She stated she was not home and would not be able to see me until sometime next week. This has been an ongoing issue with her so I asked if she felt that she was able to handle her medications and manage her heart failure without assistance. She answered in the affirmative so I advised that I would contact the clinic and have her discharged from the paramedicine program. I advised that moving forward she should contact the HF clinic with any heart failure related issues moving forward. She was understanding and agreeable.

## 2017-10-02 NOTE — Telephone Encounter (Signed)
Patient has been discharged from the [facility] for ongoing issues with failure to adhere with the plan of care.  She has been educated regarding low sodium diet as well as the importance of taking prescribed medications.  She continues to not take or fill her prescribed medications and eat high sodium foods.  She will continue to follow in the [facility] and is aware to call with worsening symptoms of HF.

## 2017-10-14 ENCOUNTER — Encounter (HOSPITAL_COMMUNITY): Payer: Self-pay | Admitting: Emergency Medicine

## 2017-10-14 ENCOUNTER — Other Ambulatory Visit: Payer: Self-pay

## 2017-10-14 ENCOUNTER — Emergency Department (HOSPITAL_COMMUNITY)
Admission: EM | Admit: 2017-10-14 | Discharge: 2017-10-15 | Disposition: A | Discharge status: Home / Self Care | Payer: Medicare Other | Attending: Emergency Medicine | Admitting: Emergency Medicine

## 2017-10-14 DIAGNOSIS — Z7901 Long term (current) use of anticoagulants: Secondary | ICD-10-CM | POA: Insufficient documentation

## 2017-10-14 DIAGNOSIS — I110 Hypertensive heart disease with heart failure: Secondary | ICD-10-CM | POA: Insufficient documentation

## 2017-10-14 DIAGNOSIS — I2510 Atherosclerotic heart disease of native coronary artery without angina pectoris: Secondary | ICD-10-CM | POA: Insufficient documentation

## 2017-10-14 DIAGNOSIS — Z794 Long term (current) use of insulin: Secondary | ICD-10-CM | POA: Insufficient documentation

## 2017-10-14 DIAGNOSIS — R1084 Generalized abdominal pain: Secondary | ICD-10-CM | POA: Insufficient documentation

## 2017-10-14 DIAGNOSIS — Z79899 Other long term (current) drug therapy: Secondary | ICD-10-CM | POA: Diagnosis not present

## 2017-10-14 DIAGNOSIS — M549 Dorsalgia, unspecified: Secondary | ICD-10-CM | POA: Diagnosis not present

## 2017-10-14 DIAGNOSIS — E119 Type 2 diabetes mellitus without complications: Secondary | ICD-10-CM | POA: Diagnosis not present

## 2017-10-14 DIAGNOSIS — I5042 Chronic combined systolic (congestive) and diastolic (congestive) heart failure: Secondary | ICD-10-CM | POA: Insufficient documentation

## 2017-10-14 DIAGNOSIS — R109 Unspecified abdominal pain: Secondary | ICD-10-CM

## 2017-10-14 MED ORDER — HYDROCODONE-ACETAMINOPHEN 5-325 MG PO TABS
2.0000 | ORAL_TABLET | Freq: Once | ORAL | Status: AC
  Administered 2017-10-14: 2 via ORAL
  Filled 2017-10-14: qty 2
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
## 2017-10-14 NOTE — ED Notes (Signed)
Pt states that pain feels like her chronic back pain but worse since she hasn't had her chronic pain meds. Denies new injury, or any urinary symptoms.

## 2017-10-15 ENCOUNTER — Emergency Department (HOSPITAL_COMMUNITY): Payer: Medicare Other

## 2017-10-15 DIAGNOSIS — R1084 Generalized abdominal pain: Secondary | ICD-10-CM | POA: Diagnosis not present

## 2017-10-15 LAB — COMPREHENSIVE METABOLIC PANEL
ALT: 9 U/L (ref 0–44)
AST: 11 U/L — ABNORMAL LOW (ref 15–41)
Albumin: 3.4 g/dL — ABNORMAL LOW (ref 3.5–5.0)
Alkaline Phosphatase: 76 U/L (ref 38–126)
Anion gap: 11 (ref 5–15)
BUN: 12 mg/dL (ref 8–23)
CO2: 26 mmol/L (ref 22–32)
Calcium: 9.1 mg/dL (ref 8.9–10.3)
Chloride: 101 mmol/L (ref 98–111)
Creatinine, Ser: 0.78 mg/dL (ref 0.44–1.00)
GFR calc Af Amer: >60 mL/min (ref >60)
GFR calc non Af Amer: >60 mL/min (ref >60)
Glucose, Bld: 299 mg/dL — ABNORMAL HIGH (ref 70–99)
Potassium: 3.8 mmol/L (ref 3.5–5.1)
Sodium: 138 mmol/L (ref 135–145)
Total Bilirubin: 0.4 mg/dL (ref 0.3–1.2)
Total Protein: 6.3 g/dL — ABNORMAL LOW (ref 6.5–8.1)

## 2017-10-15 LAB — URINALYSIS, MICROSCOPIC (REFLEX)

## 2017-10-15 LAB — URINALYSIS, ROUTINE W REFLEX MICROSCOPIC
Bilirubin Urine: NEGATIVE
Glucose, UA: >=500 mg/dL — AB
Hgb urine dipstick: NEGATIVE
Ketones, ur: NEGATIVE mg/dL
Leukocytes, UA: NEGATIVE
Nitrite: POSITIVE — AB
Protein, ur: NEGATIVE mg/dL
Specific Gravity, Urine: 1.015 (ref 1.005–1.030)
pH: 6 (ref 5.0–8.0)

## 2017-10-15 LAB — CBC WITH DIFFERENTIAL/PLATELET
Abs Immature Granulocytes: 0 10*3/uL (ref 0.0–0.1)
Basophils Absolute: 0.1 10*3/uL (ref 0.0–0.1)
Basophils Relative: 1 %
Eosinophils Absolute: 0.1 10*3/uL (ref 0.0–0.7)
Eosinophils Relative: 2 %
HCT: 32.4 % — ABNORMAL LOW (ref 36.0–46.0)
Hemoglobin: 11 g/dL — ABNORMAL LOW (ref 12.0–15.0)
Immature Granulocytes: 0 %
Lymphocytes Relative: 24 %
Lymphs Abs: 2.2 10*3/uL (ref 0.7–4.0)
MCH: 30.7 pg (ref 26.0–34.0)
MCHC: 34 g/dL (ref 30.0–36.0)
MCV: 90.5 fL (ref 78.0–100.0)
Monocytes Absolute: 0.7 10*3/uL (ref 0.1–1.0)
Monocytes Relative: 7 %
Neutro Abs: 6.2 10*3/uL (ref 1.7–7.7)
Neutrophils Relative %: 66 %
Platelets: 408 10*3/uL — ABNORMAL HIGH (ref 150–400)
RBC: 3.58 MIL/uL — ABNORMAL LOW (ref 3.87–5.11)
RDW: 12.2 % (ref 11.5–15.5)
WBC: 9.4 10*3/uL (ref 4.0–10.5)

## 2017-10-15 LAB — LIPASE, BLOOD: Lipase: 24 U/L (ref 11–51)

## 2017-10-15 MED ORDER — FUROSEMIDE 10 MG/ML IJ SOLN
40.0000 mg | Freq: Once | INTRAMUSCULAR | Status: AC
  Administered 2017-10-15: 40 mg via INTRAVENOUS
  Filled 2017-10-15: qty 4

## 2017-10-15 MED ORDER — IOHEXOL 300 MG/ML  SOLN
100.0000 mL | Freq: Once | INTRAMUSCULAR | Status: AC | PRN
  Administered 2017-10-15: 100 mL via INTRAVENOUS

## 2017-10-15 NOTE — ED Notes (Signed)
Patient getting dressed.

## 2017-10-15 NOTE — ED Notes (Signed)
Patient transported to CT 

## 2017-10-15 NOTE — ED Provider Notes (Signed)
Medical screening examination/treatment/procedure(s) were conducted as a shared visit with non-physician practitioner(s) and myself.  I personally evaluated the patient during the encounter.  None    Patient is a 67 year old female with history of chronic back and right leg pain who presents today with complaints of back and right-sided abdominal pain.  Work-up unremarkable including negative CT scan which only showed pulmonary edema.  Chronic shortness of breath which is unchanged but given dose of Lasix here in the emergency department.  Patient requesting something for pain to go home with but it appears that she just had 40 oxycodone tablets filled 2 days ago.  Suspect some drug-seeking behavior.  Will be discharged with outpatient PCP follow-up.   Zyasia Halbleib, Delice Bison, DO 10/15/17 [phone]

## 2017-10-15 NOTE — Discharge Instructions (Addendum)
We cannot give you any additional narcotic pain medications because this medication was recently filled.  Please follow-up with your primary care provider on this matter.  Return to the ED for recurrent or worsening symptoms.
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
## 2017-10-15 NOTE — ED Notes (Signed)
Paient states she does not understand why they didn't give her any additional pain medication. Advised per d/C papers patient medication was just refilled.

## 2017-10-19 ENCOUNTER — Other Ambulatory Visit: Payer: Self-pay

## 2017-10-19 ENCOUNTER — Encounter (HOSPITAL_COMMUNITY): Payer: Self-pay

## 2017-10-19 ENCOUNTER — Emergency Department (HOSPITAL_COMMUNITY)
Admission: EM | Admit: 2017-10-19 | Discharge: 2017-10-20 | Disposition: A | Discharge status: Home / Self Care | Payer: Medicare Other | Source: Home / Self Care | Attending: Emergency Medicine | Admitting: Emergency Medicine

## 2017-10-19 DIAGNOSIS — Z79899 Other long term (current) drug therapy: Secondary | ICD-10-CM

## 2017-10-19 DIAGNOSIS — I5042 Chronic combined systolic (congestive) and diastolic (congestive) heart failure: Secondary | ICD-10-CM | POA: Insufficient documentation

## 2017-10-19 DIAGNOSIS — Y939 Activity, unspecified: Secondary | ICD-10-CM | POA: Insufficient documentation

## 2017-10-19 DIAGNOSIS — I110 Hypertensive heart disease with heart failure: Secondary | ICD-10-CM

## 2017-10-19 DIAGNOSIS — M79674 Pain in right toe(s): Secondary | ICD-10-CM

## 2017-10-19 DIAGNOSIS — W458XXA Other foreign body or object entering through skin, initial encounter: Secondary | ICD-10-CM

## 2017-10-19 DIAGNOSIS — E119 Type 2 diabetes mellitus without complications: Secondary | ICD-10-CM | POA: Insufficient documentation

## 2017-10-19 DIAGNOSIS — Y929 Unspecified place or not applicable: Secondary | ICD-10-CM

## 2017-10-19 DIAGNOSIS — Z87891 Personal history of nicotine dependence: Secondary | ICD-10-CM | POA: Insufficient documentation

## 2017-10-19 DIAGNOSIS — I2510 Atherosclerotic heart disease of native coronary artery without angina pectoris: Secondary | ICD-10-CM

## 2017-10-19 DIAGNOSIS — Y999 Unspecified external cause status: Secondary | ICD-10-CM

## 2017-10-19 DIAGNOSIS — Z794 Long term (current) use of insulin: Secondary | ICD-10-CM | POA: Insufficient documentation

## 2017-10-19 DIAGNOSIS — M79671 Pain in right foot: Secondary | ICD-10-CM

## 2017-10-19 DIAGNOSIS — Z96643 Presence of artificial hip joint, bilateral: Secondary | ICD-10-CM

## 2017-10-19 DIAGNOSIS — Z7901 Long term (current) use of anticoagulants: Secondary | ICD-10-CM | POA: Insufficient documentation

## 2017-10-19 DIAGNOSIS — Z96651 Presence of right artificial knee joint: Secondary | ICD-10-CM | POA: Insufficient documentation

## 2017-10-19 DIAGNOSIS — Z760 Encounter for issue of repeat prescription: Secondary | ICD-10-CM

## 2017-10-19 NOTE — ED Triage Notes (Signed)
Pt here for a fall after stepping on an earring.  Complaining of pain to the right great toe.  Did not hit her head, no LOC.

## 2017-10-20 ENCOUNTER — Emergency Department (HOSPITAL_COMMUNITY): Payer: Medicare Other

## 2017-10-20 MED ORDER — INSULIN GLARGINE 100 UNIT/ML SOLOSTAR PEN: 50.0000 [IU] | PEN_INJECTOR | Freq: Every day | SUBCUTANEOUS | 0 refills | Status: DC

## 2017-10-20 NOTE — ED Provider Notes (Signed)
[facility] EMERGENCY DEPARTMENT Provider Note   CSN: [id number] Arrival date & time: 10/19/17  2309     History   Chief Complaint Chief Complaint  Patient presents with  . Leg Pain  . Fall    HPI [name] is a 67 y.o. female who presents with right great toe pain. She states that she stepped on an earring earlier this evening and felt and acute onset of pain between her right great toe and 2nd toe. She states that pain radiates all the way up to her knee. Nothing makes it better or worse. Additionally she requests refill of her Lantus because she's been out for 2 days. She has an appointment with her PCP in a couple weeks.   HPI  Past Medical History:  Diagnosis Date  . Anemia    a. Noted on 07/2012 labs, instructed to f/u PCP.  Marland Kitchen Arthritis    "joints" (11/18/2012)  . CAD (coronary artery disease), native coronary artery    a. Nonobstructive by cath 02/2012 (done because of low EF).  . Chronic bronchitis ([place])    "~ every other year" (11/18/2012)  . Chronic combined systolic and diastolic CHF (congestive heart failure) ([place])    a. 03/05/12 echo:  LVEF 20-25%, moderate LVH , inferior and basal to mid septal akinesis, anterior moderate to severe hypokinesis and grade 2 diastolic dysfunction. b. EF 07/2012: EF still 25% (unclear medication compliance).  . Chronic lower back pain   . Headache(784.0)    "often; maybe not daily" (11/18/2012)  . High cholesterol   . History of noncompliance with medical treatment   . Hypertension   . LBBB (left bundle branch block)   . Orthopnea   . Tobacco abuse   . Type II diabetes mellitus [facility])     Patient Active Problem List   Diagnosis Date Noted  . Atrial fibrillation ([place]) 02/11/2017  . Paroxysmal A-fib ([place])   . Biventricular automatic implantable cardioverter defibrillator in situ   . Sepsis ([place]) 04/20/2016  . UTI (urinary tract infection) 04/20/2016  . Dyspnea 07/19/2015  . Interstitial lung disease ([place])  11/20/2014  . SIRS (systemic inflammatory response syndrome) ([place]) 02/03/2014  . IDDM (insulin dependent diabetes mellitus) ([place]) 02/03/2014  . Tachycardia 06/14/2013  . Chronic systolic CHF (congestive heart failure) ([place]) 09/29/2012  . Hypoxia 03/06/2012  . Hypertensive heart disease 03/06/2012  . Nonischemic cardiomyopathy ([place]) 03/06/2012  . Tobacco abuse 03/06/2012  . Type 2 diabetes mellitus ([place]) 03/06/2012  . Hypertension 03/06/2012  . CAD (coronary artery disease), native coronary artery 03/06/2012  . Hypokalemia 03/06/2012  . Acute bronchitis 02/01/2009  . Sleep apnea 02/01/2009  . Chest pain 02/01/2009    Past Surgical History:  Procedure Laterality Date  . BI-VENTRICULAR IMPLANTABLE CARDIOVERTER DEFIBRILLATOR N/A 11/18/2012   Procedure: BI-VENTRICULAR IMPLANTABLE CARDIOVERTER DEFIBRILLATOR  (CRT-D);  Surgeon: Evans Lance, MD;  Location: [facility] CATH LAB;  Service: Cardiovascular;  Laterality: N/A;  . BI-VENTRICULAR IMPLANTABLE CARDIOVERTER DEFIBRILLATOR  (CRT-D)  11/18/2012  . CARDIAC CATHETERIZATION  03/04/12   nonobstructive CAD, elevated LVEDP and tortuous vessels suggestive of long-standing hypertension  . COLONOSCOPY WITH PROPOFOL N/A 12/12/2016   Procedure: COLONOSCOPY WITH PROPOFOL;  Surgeon: Carol Ada, MD;  Location: [facility] ENDOSCOPY;  Service: Endoscopy;  Laterality: N/A;  . JOINT REPLACEMENT     Bilateral hip and right knee  . LEFT HEART CATH N/A 03/05/2012   Procedure: LEFT HEART CATH;  Surgeon: Larey Dresser, MD;  Location: [facility] C[facility] LAB;  Service:  Cardiovascular;  Laterality: N/A;     OB History   None      Home Medications    Prior to Admission medications   Medication Sig Start Date End Date Taking? Authorizing Provider  acetaminophen (TYLENOL) 500 MG tablet Take 1 tablet (500 mg total) by mouth every 6 (six) hours as needed. Patient not taking: Reported on 08/31/2017 09/15/15   Leo Grosser, MD  albuterol (PROVENTIL HFA;VENTOLIN HFA) 108 (90 Base)  MCG/ACT inhaler Inhale 2 puffs into the lungs every 6 (six) hours as needed for wheezing or shortness of breath. 04/25/16   Ghimire, Henreitta Leber, MD  apixaban (ELIQUIS) 5 MG TABS tablet Take 1 tablet (5 mg total) by mouth 2 (two) times daily. 05/01/17   Larey Dresser, MD  atorvastatin (LIPITOR) 40 MG tablet Take 1 tablet (40 mg total) by mouth daily. 06/17/17   Larey Dresser, MD  carvedilol (COREG) 25 MG tablet Take 1 tablet (25 mg total) by mouth 2 (two) times daily with a meal. 03/17/17   Tillery, Satira Mccallum, PA-C  diclofenac sodium (VOLTAREN) 1 % GEL Apply 4 g topically 4 (four) times daily. Patient taking differently: Apply 4 g topically daily as needed.  07/26/17   Duffy Bruce, MD  ENTRESTO 97-103 MG TAKE 1 TABLET BY MOUTH TWICE DAILY 06/17/17   Bensimhon, Shaune Pascal, MD  furosemide (LASIX) 40 MG tablet Take 1 tablet (40 mg total) by mouth 2 (two) times daily. 05/19/17   Maylee Friar, PA-C  gabapentin (NEURONTIN) 100 MG capsule Take 100 mg at bedtime 06/03/17   [provider]  glucose blood (FREESTYLE TEST STRIPS) test strip Use as instructed 04/25/16   Ghimire, Henreitta Leber, MD  glucose monitoring kit (FREESTYLE) monitoring kit 1 each by Does not apply route 4 (four) times daily - after meals and at bedtime. 1 month Diabetic Testing Supplies for QAC-QHS accuchecks. 04/25/16   Ghimire, Henreitta Leber, MD  HYDROcodone-acetaminophen (NORCO) 7.5-325 MG tablet Take 1 tablet by mouth 3 (three) times daily as needed. 08/13/17   [provider]  insulin aspart (NOVOLOG FLEXPEN) 100 UNIT/ML FlexPen 0-20 Units, Subcutaneous, 3 times daily with meals CBG < 70: implement hypoglycemia protocol CBG 70 - 120: 0 units CBG 121 - 150: 3 units CBG 151 - 200: 4 units CBG 201 - 250: 7 units CBG 251 - 300: 11 units CBG 301 - 350: 15 units CBG 351 - 400: 20 units CBG > 400: call MD Patient not taking: Reported on 09/15/2017 04/25/16   Jonetta Osgood, MD  Insulin Glargine (LANTUS SOLOSTAR)  100 UNIT/ML Solostar Pen Inject 50 Units into the skin daily at 10 pm. 10/20/17   Recardo Evangelist, PA-C  Insulin Pen Needle 32G X 8 MM MISC Use as directed 06/18/16   Arbutus Leas, NP  Lancets (FREESTYLE) lancets Use as instructed 06/18/16   Arbutus Leas, NP  metFORMIN (GLUCOPHAGE) 1000 MG tablet TAKE 1 TABLET(1000 MG) BY MOUTH TWICE DAILY WITH A MEAL 05/30/16   Genola Friar, PA-C  naproxen sodium (ALEVE) 220 MG tablet Take 220 mg daily as needed by mouth (pain).    [provider]  NARCAN 4 MG/0.1ML LIQD nasal spray kit U UTD 04/24/17   [provider]  ondansetron (ZOFRAN) 4 MG tablet Take 1 tablet (4 mg total) by mouth every 8 (eight) hours as needed for nausea or vomiting. Patient not taking: Reported on 09/15/2017 08/14/17   Domenic Moras, PA-C  potassium chloride 20 MEQ TBCR  Take 40 mEq by mouth daily. Patient not taking: Reported on 09/15/2017 10/07/16   Arbutus Leas, NP  spironolactone (ALDACTONE) 25 MG tablet Take 1 tablet (25 mg total) by mouth daily. 05/22/16   Conrad [place], NP    Family History Family History  Problem Relation Age of Onset  . Heart disease Neg Hx     Social History Social History   Tobacco Use  . Smoking status: Former Smoker    Packs/day: 1.00    Years: 40.00    Pack years: 40.00    Types: Cigarettes    Start date: 06/23/1972    Last attempt to quit: 03/26/2012    Years since quitting: 5.5  . Smokeless tobacco: Never Used  Substance Use Topics  . Alcohol use: No  . Drug use: No     Allergies   Morphine and related and Penicillins   Review of Systems Review of Systems  Musculoskeletal: Positive for arthralgias and myalgias.  Neurological: Negative for weakness.     Physical Exam Updated Vital Signs BP 115/89 (BP Location: Left Arm)   Pulse (!) 105   Temp 99.5 F (37.5 C) (Oral)   Resp 20   Ht '5\' 7"'  (1.702 m)   Wt 97.1 kg   SpO2 97%   BMI 33.52 kg/m   Physical Exam  Constitutional: She is oriented to  person, place, and time. She appears well-developed and well-nourished. No distress.  HENT:  Head: Normocephalic and atraumatic.  Eyes: Pupils are equal, round, and reactive to light. Conjunctivae are normal. Right eye exhibits no discharge. Left eye exhibits no discharge. No scleral icterus.  Neck: Normal range of motion.  Cardiovascular: Normal rate.  Pulmonary/Chest: Effort normal. No respiratory distress.  Abdominal: She exhibits no distension.  Musculoskeletal:  No appreciable swelling or wound between the great and 2nd toes. 2+ DP pulse  Neurological: She is alert and oriented to person, place, and time.  Skin: Skin is warm and dry.  Psychiatric: She has a normal mood and affect. Her behavior is normal.  Nursing note and vitals reviewed.    ED Treatments / Results  Labs (all labs ordered are listed, but only abnormal results are displayed) Labs Reviewed - No data to display  EKG None  Radiology Dg Foot Complete Right  Result Date: 10/20/2017 CLINICAL DATA:  Fall.  Right foot pain. EXAM: RIGHT FOOT COMPLETE - 3+ VIEW COMPARISON:  None. FINDINGS: No fracture or dislocation. No suspicious focal osseous lesions. Small plantar right calcaneal spur. No radiopaque foreign bodies. Mild degenerative changes in the dorsal tarsal joints. IMPRESSION: No fracture or malalignment.  No radiopaque foreign body. Electronically Signed   By: Ilona Sorrel M.D.   On: 10/20/2017 00:26    Procedures Procedures (including critical care time)  Medications Ordered in ED Medications - No data to display   Initial Impression / Assessment and Plan / ED Course  I have reviewed the triage vital signs and the nursing notes.  Pertinent labs & imaging results that were available during my care of the patient were reviewed by me and considered in my medical decision making (see chart for details).  67 year old female presents with toe pain after reportedly after stepping on an earring. I see no  evidence of a wound on exam. No signs of infection. She is ambulatory. Xray is negative for foreign body. Will d/c with PCP f/u. She was given a refill of Lantus.  Final Clinical Impressions(s) / ED Diagnoses  Final diagnoses:  Right foot pain  Medication refill    ED Discharge Orders         Ordered    Insulin Glargine (LANTUS SOLOSTAR) 100 UNIT/ML Solostar Pen  Daily at 10 pm     10/20/17 0412           Recardo Evangelist, PA-C 72/07/21 [id number]    Delora Fuel, MD 33/74/45 [phone]

## 2017-10-20 NOTE — ED Notes (Signed)
Pt assisted to the bathroom with standby assist 

## 2017-10-22 ENCOUNTER — Emergency Department (HOSPITAL_COMMUNITY): Payer: Medicare Other

## 2017-10-22 ENCOUNTER — Observation Stay (HOSPITAL_COMMUNITY): Payer: Medicare Other

## 2017-10-22 ENCOUNTER — Other Ambulatory Visit: Payer: Self-pay

## 2017-10-22 ENCOUNTER — Inpatient Hospital Stay (HOSPITAL_COMMUNITY)
Admission: EM | Admit: 2017-10-22 | Discharge: 2017-10-25 | DRG: 638 | Disposition: A | Discharge status: Home / Self Care | Payer: Medicare Other | Attending: Internal Medicine | Admitting: Internal Medicine

## 2017-10-22 ENCOUNTER — Encounter (HOSPITAL_COMMUNITY): Payer: Self-pay | Admitting: Obstetrics and Gynecology

## 2017-10-22 DIAGNOSIS — I480 Paroxysmal atrial fibrillation: Secondary | ICD-10-CM | POA: Diagnosis not present

## 2017-10-22 DIAGNOSIS — I5022 Chronic systolic (congestive) heart failure: Secondary | ICD-10-CM | POA: Diagnosis present

## 2017-10-22 DIAGNOSIS — R51 Headache: Secondary | ICD-10-CM | POA: Diagnosis present

## 2017-10-22 DIAGNOSIS — Z79899 Other long term (current) drug therapy: Secondary | ICD-10-CM

## 2017-10-22 DIAGNOSIS — Z87891 Personal history of nicotine dependence: Secondary | ICD-10-CM

## 2017-10-22 DIAGNOSIS — L03115 Cellulitis of right lower limb: Secondary | ICD-10-CM | POA: Diagnosis present

## 2017-10-22 DIAGNOSIS — E669 Obesity, unspecified: Secondary | ICD-10-CM | POA: Diagnosis present

## 2017-10-22 DIAGNOSIS — L03119 Cellulitis of unspecified part of limb: Secondary | ICD-10-CM | POA: Diagnosis present

## 2017-10-22 DIAGNOSIS — G4730 Sleep apnea, unspecified: Secondary | ICD-10-CM | POA: Diagnosis present

## 2017-10-22 DIAGNOSIS — Z96651 Presence of right artificial knee joint: Secondary | ICD-10-CM | POA: Diagnosis present

## 2017-10-22 DIAGNOSIS — E11628 Type 2 diabetes mellitus with other skin complications: Principal | ICD-10-CM | POA: Diagnosis present

## 2017-10-22 DIAGNOSIS — Z9989 Dependence on other enabling machines and devices: Secondary | ICD-10-CM

## 2017-10-22 DIAGNOSIS — Z885 Allergy status to narcotic agent status: Secondary | ICD-10-CM

## 2017-10-22 DIAGNOSIS — D649 Anemia, unspecified: Secondary | ICD-10-CM | POA: Diagnosis present

## 2017-10-22 DIAGNOSIS — M1610 Unilateral primary osteoarthritis, unspecified hip: Secondary | ICD-10-CM | POA: Diagnosis present

## 2017-10-22 DIAGNOSIS — IMO0001 Reserved for inherently not codable concepts without codable children: Secondary | ICD-10-CM

## 2017-10-22 DIAGNOSIS — Z87892 Personal history of anaphylaxis: Secondary | ICD-10-CM

## 2017-10-22 DIAGNOSIS — R739 Hyperglycemia, unspecified: Secondary | ICD-10-CM

## 2017-10-22 DIAGNOSIS — I447 Left bundle-branch block, unspecified: Secondary | ICD-10-CM | POA: Diagnosis present

## 2017-10-22 DIAGNOSIS — I10 Essential (primary) hypertension: Secondary | ICD-10-CM | POA: Diagnosis present

## 2017-10-22 DIAGNOSIS — E785 Hyperlipidemia, unspecified: Secondary | ICD-10-CM | POA: Diagnosis present

## 2017-10-22 DIAGNOSIS — I110 Hypertensive heart disease with heart failure: Secondary | ICD-10-CM | POA: Diagnosis present

## 2017-10-22 DIAGNOSIS — M545 Low back pain: Secondary | ICD-10-CM | POA: Diagnosis present

## 2017-10-22 DIAGNOSIS — G4733 Obstructive sleep apnea (adult) (pediatric): Secondary | ICD-10-CM | POA: Diagnosis present

## 2017-10-22 DIAGNOSIS — W268XXA Contact with other sharp object(s), not elsewhere classified, initial encounter: Secondary | ICD-10-CM

## 2017-10-22 DIAGNOSIS — E1140 Type 2 diabetes mellitus with diabetic neuropathy, unspecified: Secondary | ICD-10-CM | POA: Diagnosis present

## 2017-10-22 DIAGNOSIS — Z9119 Patient's noncompliance with other medical treatment and regimen: Secondary | ICD-10-CM

## 2017-10-22 DIAGNOSIS — G8929 Other chronic pain: Secondary | ICD-10-CM | POA: Diagnosis present

## 2017-10-22 DIAGNOSIS — B999 Unspecified infectious disease: Secondary | ICD-10-CM

## 2017-10-22 DIAGNOSIS — Z96643 Presence of artificial hip joint, bilateral: Secondary | ICD-10-CM | POA: Diagnosis present

## 2017-10-22 DIAGNOSIS — I5042 Chronic combined systolic (congestive) and diastolic (congestive) heart failure: Secondary | ICD-10-CM | POA: Diagnosis present

## 2017-10-22 DIAGNOSIS — I4891 Unspecified atrial fibrillation: Secondary | ICD-10-CM | POA: Diagnosis present

## 2017-10-22 DIAGNOSIS — J849 Interstitial pulmonary disease, unspecified: Secondary | ICD-10-CM | POA: Diagnosis present

## 2017-10-22 DIAGNOSIS — Z794 Long term (current) use of insulin: Secondary | ICD-10-CM

## 2017-10-22 DIAGNOSIS — J42 Unspecified chronic bronchitis: Secondary | ICD-10-CM | POA: Diagnosis present

## 2017-10-22 DIAGNOSIS — L089 Local infection of the skin and subcutaneous tissue, unspecified: Secondary | ICD-10-CM | POA: Diagnosis present

## 2017-10-22 DIAGNOSIS — S91331A Puncture wound without foreign body, right foot, initial encounter: Secondary | ICD-10-CM | POA: Diagnosis present

## 2017-10-22 DIAGNOSIS — E876 Hypokalemia: Secondary | ICD-10-CM | POA: Diagnosis present

## 2017-10-22 DIAGNOSIS — E1165 Type 2 diabetes mellitus with hyperglycemia: Secondary | ICD-10-CM | POA: Diagnosis present

## 2017-10-22 DIAGNOSIS — Z6833 Body mass index (BMI) 33.0-33.9, adult: Secondary | ICD-10-CM

## 2017-10-22 DIAGNOSIS — Z95810 Presence of automatic (implantable) cardiac defibrillator: Secondary | ICD-10-CM

## 2017-10-22 DIAGNOSIS — I2510 Atherosclerotic heart disease of native coronary artery without angina pectoris: Secondary | ICD-10-CM | POA: Diagnosis present

## 2017-10-22 DIAGNOSIS — Z880 Allergy status to penicillin: Secondary | ICD-10-CM

## 2017-10-22 DIAGNOSIS — E119 Type 2 diabetes mellitus without complications: Secondary | ICD-10-CM

## 2017-10-22 LAB — CBC WITH DIFFERENTIAL/PLATELET
Basophils Absolute: 0 10*3/uL (ref 0.0–0.1)
Basophils Relative: 0 %
Eosinophils Absolute: 0 10*3/uL (ref 0.0–0.7)
Eosinophils Relative: 0 %
HCT: 34.9 % — ABNORMAL LOW (ref 36.0–46.0)
Hemoglobin: 12.2 g/dL (ref 12.0–15.0)
Lymphocytes Relative: 8 %
Lymphs Abs: 2 10*3/uL (ref 0.7–4.0)
MCH: 31.3 pg (ref 26.0–34.0)
MCHC: 35 g/dL (ref 30.0–36.0)
MCV: 89.5 fL (ref 78.0–100.0)
Monocytes Absolute: 1.4 10*3/uL — ABNORMAL HIGH (ref 0.1–1.0)
Monocytes Relative: 6 %
Neutro Abs: 20.2 10*3/uL — ABNORMAL HIGH (ref 1.7–7.7)
Neutrophils Relative %: 86 %
Platelets: 445 10*3/uL — ABNORMAL HIGH (ref 150–400)
RBC: 3.9 MIL/uL (ref 3.87–5.11)
RDW: 12.7 % (ref 11.5–15.5)
WBC: 23.6 10*3/uL — ABNORMAL HIGH (ref 4.0–10.5)

## 2017-10-22 LAB — BASIC METABOLIC PANEL
Anion gap: 13 (ref 5–15)
BUN: 15 mg/dL (ref 8–23)
CO2: 29 mmol/L (ref 22–32)
Calcium: 9.8 mg/dL (ref 8.9–10.3)
Chloride: 101 mmol/L (ref 98–111)
Creatinine, Ser: 1.02 mg/dL — ABNORMAL HIGH (ref 0.44–1.00)
GFR calc Af Amer: >60 mL/min (ref >60)
GFR calc non Af Amer: 57 mL/min — ABNORMAL LOW (ref >60)
Glucose, Bld: 329 mg/dL — ABNORMAL HIGH (ref 70–99)
Potassium: 3.3 mmol/L — ABNORMAL LOW (ref 3.5–5.1)
Sodium: 143 mmol/L (ref 135–145)

## 2017-10-22 LAB — I-STAT CG4 LACTIC ACID, ED: Lactic Acid, Venous: 1.03 mmol/L (ref 0.5–1.9)

## 2017-10-22 LAB — GLUCOSE, CAPILLARY
Glucose-Capillary: 322 mg/dL — ABNORMAL HIGH (ref 70–99)
Glucose-Capillary: 244 mg/dL — ABNORMAL HIGH (ref 70–99)

## 2017-10-22 LAB — HEMOGLOBIN A1C
Hgb A1c MFr Bld: 10.9 % — ABNORMAL HIGH (ref 4.8–5.6)
Mean Plasma Glucose: 266.13 mg/dL

## 2017-10-22 MED ORDER — ACETAMINOPHEN 325 MG PO TABS: 650.0000 mg | ORAL_TABLET | Freq: Four times a day (QID) | ORAL | Status: DC | PRN

## 2017-10-22 MED ORDER — CARVEDILOL 25 MG PO TABS
25.0000 mg | ORAL_TABLET | Freq: Two times a day (BID) | ORAL | Status: DC
  Administered 2017-10-22 – 2017-10-25 (×6): 25 mg via ORAL
  Filled 2017-10-22 (×6): qty 1

## 2017-10-22 MED ORDER — GABAPENTIN 300 MG PO CAPS
300.0000 mg | ORAL_CAPSULE | Freq: Two times a day (BID) | ORAL | Status: DC
  Administered 2017-10-22 – 2017-10-25 (×5): 300 mg via ORAL
  Filled 2017-10-22 (×6): qty 1

## 2017-10-22 MED ORDER — VANCOMYCIN HCL 10 G IV SOLR
1250.0000 mg | INTRAVENOUS | Status: DC
  Administered 2017-10-23 – 2017-10-24 (×2): 1250 mg via INTRAVENOUS
  Filled 2017-10-22 (×2): qty 1250

## 2017-10-22 MED ORDER — ACETAMINOPHEN 650 MG RE SUPP: 650.0000 mg | Freq: Four times a day (QID) | RECTAL | Status: DC | PRN

## 2017-10-22 MED ORDER — INSULIN ASPART 100 UNIT/ML ~~LOC~~ SOLN
0.0000 [IU] | Freq: Every day | SUBCUTANEOUS | Status: DC
  Administered 2017-10-22: 2 [IU] via SUBCUTANEOUS

## 2017-10-22 MED ORDER — FUROSEMIDE 40 MG PO TABS
40.0000 mg | ORAL_TABLET | Freq: Two times a day (BID) | ORAL | Status: DC
  Administered 2017-10-22 – 2017-10-25 (×6): 40 mg via ORAL
  Filled 2017-10-22 (×6): qty 1

## 2017-10-22 MED ORDER — POLYETHYLENE GLYCOL 3350 17 G PO PACK: 17.0000 g | PACK | Freq: Every day | ORAL | Status: DC | PRN

## 2017-10-22 MED ORDER — INSULIN ASPART 100 UNIT/ML ~~LOC~~ SOLN
0.0000 [IU] | Freq: Three times a day (TID) | SUBCUTANEOUS | Status: DC
  Administered 2017-10-22 – 2017-10-23 (×2): 15 [IU] via SUBCUTANEOUS
  Administered 2017-10-23: 3 [IU] via SUBCUTANEOUS
  Administered 2017-10-23: 4 [IU] via SUBCUTANEOUS
  Administered 2017-10-24: 15 [IU] via SUBCUTANEOUS
  Administered 2017-10-24: 4 [IU] via SUBCUTANEOUS
  Administered 2017-10-24 – 2017-10-25 (×2): 11 [IU] via SUBCUTANEOUS
  Administered 2017-10-25: 3 [IU] via SUBCUTANEOUS

## 2017-10-22 MED ORDER — INSULIN GLARGINE 100 UNIT/ML SOLOSTAR PEN: 50.0000 [IU] | PEN_INJECTOR | Freq: Every day | SUBCUTANEOUS | Status: DC

## 2017-10-22 MED ORDER — INSULIN GLARGINE 100 UNIT/ML ~~LOC~~ SOLN
25.0000 [IU] | Freq: Every day | SUBCUTANEOUS | Status: DC
  Administered 2017-10-22: 25 [IU] via SUBCUTANEOUS
  Filled 2017-10-22 (×2): qty 0.25

## 2017-10-22 MED ORDER — VANCOMYCIN HCL 10 G IV SOLR
2000.0000 mg | Freq: Once | INTRAVENOUS | Status: AC
  Administered 2017-10-22: 2000 mg via INTRAVENOUS
  Filled 2017-10-22: qty 2000

## 2017-10-22 MED ORDER — SACUBITRIL-VALSARTAN 97-103 MG PO TABS
1.0000 | ORAL_TABLET | Freq: Two times a day (BID) | ORAL | Status: DC
  Administered 2017-10-22 – 2017-10-25 (×5): 1 via ORAL
  Filled 2017-10-22 (×6): qty 1

## 2017-10-22 MED ORDER — OXYCODONE HCL 5 MG PO TABS
5.0000 mg | ORAL_TABLET | ORAL | Status: DC | PRN
  Administered 2017-10-22 – 2017-10-24 (×4): 5 mg via ORAL
  Filled 2017-10-22 (×3): qty 1
  Filled 2017-10-22: qty 2
  Filled 2017-10-22: qty 1

## 2017-10-22 MED ORDER — SODIUM CHLORIDE 0.9 % IV SOLN
INTRAVENOUS | Status: DC | PRN
  Administered 2017-10-22 – 2017-10-25 (×2): 500 mL via INTRAVENOUS

## 2017-10-22 MED ORDER — ENOXAPARIN SODIUM 40 MG/0.4ML ~~LOC~~ SOLN
40.0000 mg | SUBCUTANEOUS | Status: DC
  Administered 2017-10-22 – 2017-10-24 (×3): 40 mg via SUBCUTANEOUS
  Filled 2017-10-22 (×3): qty 0.4

## 2017-10-22 MED ORDER — INSULIN GLARGINE 100 UNIT/ML ~~LOC~~ SOLN
50.0000 [IU] | Freq: Every day | SUBCUTANEOUS | Status: DC
  Filled 2017-10-22: qty 0.5

## 2017-10-22 MED ORDER — LACTATED RINGERS IV BOLUS
1000.0000 mL | Freq: Once | INTRAVENOUS | Status: AC
  Administered 2017-10-22: 1000 mL via INTRAVENOUS

## 2017-10-22 MED ORDER — SODIUM CHLORIDE 0.9 % IV SOLN
1.0000 g | Freq: Three times a day (TID) | INTRAVENOUS | Status: DC
  Administered 2017-10-22 – 2017-10-24 (×6): 1 g via INTRAVENOUS
  Filled 2017-10-22 (×8): qty 1

## 2017-10-22 MED ORDER — ATORVASTATIN CALCIUM 40 MG PO TABS
40.0000 mg | ORAL_TABLET | Freq: Every day | ORAL | Status: DC
  Administered 2017-10-22 – 2017-10-24 (×3): 40 mg via ORAL
  Filled 2017-10-22 (×3): qty 1

## 2017-10-22 MED ORDER — TRAZODONE HCL 50 MG PO TABS
50.0000 mg | ORAL_TABLET | Freq: Every evening | ORAL | Status: DC | PRN
  Administered 2017-10-22: 50 mg via ORAL
  Filled 2017-10-22: qty 1

## 2017-10-22 NOTE — ED Notes (Signed)
ED TO INPATIENT HANDOFF REPORT  Name/Age/Gender [name] 67 y.o. female  Code Status Code Status History    Date Active Date Inactive Code Status Order ID Comments User Context   02/12/2017 0002 02/13/2017 1354 Full Code [id number]  Pixie Casino, MD ED   04/20/2016 1334 04/25/2016 1746 Full Code [id number]  Brenton Grills, PA-C ED   07/19/2015 1518 07/20/2015 1343 Full Code [id number]  Cheryln Manly, NP Inpatient   07/12/2014 2200 07/15/2014 1547 Full Code [id number]  Almyra Deforest, [place] Inpatient   02/03/2014 1754 02/04/2014 1459 Full Code [id number]  Phillips Climes, MD Inpatient   11/18/2012 1308 11/19/2012 1558 Full Code [id number]  Evans Lance, MD Inpatient      Home/SNF/Other Home  Chief Complaint Ft Swelling/Pain  Level of Care/Admitting Diagnosis ED Disposition    ED Disposition Condition [place]: [facility] [564332]  Level of Care: Med-Surg [16]  Diagnosis: Right foot infection [951884]  Admitting Physician: Mercy Riding [[id number]]  Attending Physician: Mercy Riding [[id number]]  PT Class (Do Not Modify): Observation [104]  PT Acc Code (Do Not Modify): Observation [10022]       Medical History Past Medical History:  Diagnosis Date  . Anemia    a. Noted on 07/2012 labs, instructed to f/u PCP.  Marland Kitchen Arthritis    "joints" (11/18/2012)  . CAD (coronary artery disease), native coronary artery    a. Nonobstructive by cath 02/2012 (done because of low EF).  . Chronic bronchitis ([place])    "~ every other year" (11/18/2012)  . Chronic combined systolic and diastolic CHF (congestive heart failure) ([place])    a. 03/05/12 echo:  LVEF 20-25%, moderate LVH , inferior and basal to mid septal akinesis, anterior moderate to severe hypokinesis and grade 2 diastolic dysfunction. b. EF 07/2012: EF still 25% (unclear medication compliance).  . Chronic lower back pain   . Headache(784.0)    "often; maybe not daily" (11/18/2012)  . High  cholesterol   . History of noncompliance with medical treatment   . Hypertension   . LBBB (left bundle branch block)   . Orthopnea   . Tobacco abuse   . Type II diabetes mellitus ([facility])     Allergies Allergies  Allergen Reactions  . Morphine And Related Nausea And Vomiting and Other (See Comments)    Severe nausea  . Penicillins Other (See Comments)    Pt states she has had a pain in her leg since a penicillin injection 2 months ago (reported 08/31/17) Has patient had a PCN reaction causing immediate rash, facial/tongue/throat swelling, SOB or lightheadedness with hypotension: No Has patient had a PCN reaction causing severe rash involving mucus membranes or skin necrosis: No Has patient had a PCN reaction that required hospitalization: No Has patient had a PCN reaction occurring within the last 10 years: Yes If all of the above answers are "NO", t    IV Location/Drains/Wounds Patient Lines/Drains/Airways Status   Active Line/Drains/Airways    Name:   Placement date:   Placement time:   Site:   Days:   Peripheral IV 10/22/17 Right Antecubital   10/22/17    1325    Antecubital   less than 1          Labs/Imaging Results for orders placed or performed during the hospital encounter of 10/22/17 (from the past 48 hour(s))  CBC with Differential     Status: Abnormal   Collection Time: 10/22/17  1:11  PM  Result Value Ref Range   WBC 23.6 (H) 4.0 - 10.5 K/uL   RBC 3.90 3.87 - 5.11 MIL/uL   Hemoglobin 12.2 12.0 - 15.0 g/dL   HCT 34.9 (L) 36.0 - 46.0 %   MCV 89.5 78.0 - 100.0 fL   MCH 31.3 26.0 - 34.0 pg   MCHC 35.0 30.0 - 36.0 g/dL   RDW 12.7 11.5 - 15.5 %   Platelets 445 (H) 150 - 400 K/uL   Neutrophils Relative % 86 %   Neutro Abs 20.2 (H) 1.7 - 7.7 K/uL   Lymphocytes Relative 8 %   Lymphs Abs 2.0 0.7 - 4.0 K/uL   Monocytes Relative 6 %   Monocytes Absolute 1.4 (H) 0.1 - 1.0 K/uL   Eosinophils Relative 0 %   Eosinophils Absolute 0.0 0.0 - 0.7 K/uL   Basophils Relative 0 %    Basophils Absolute 0.0 0.0 - 0.1 K/uL    Comment: Performed at [facility], [place] [address] [place], [place] [address]  Basic metabolic panel     Status: Abnormal   Collection Time: 10/22/17  1:11 PM  Result Value Ref Range   Sodium 143 135 - 145 mmol/L   Potassium 3.3 (L) 3.5 - 5.1 mmol/L   Chloride 101 98 - 111 mmol/L   CO2 29 22 - 32 mmol/L   Glucose, Bld 329 (H) 70 - 99 mg/dL   BUN 15 8 - 23 mg/dL   Creatinine, Ser 1.02 (H) 0.44 - 1.00 mg/dL   Calcium 9.8 8.9 - 10.3 mg/dL   GFR calc non Af Amer 57 (L) >60 mL/min   GFR calc Af Amer >60 >60 mL/min    Comment: (NOTE) The eGFR has been calculated using the CKD EPI equation. This calculation has not been validated in all clinical situations. eGFR's persistently <60 mL/min signify possible Chronic Kidney Disease.    Anion gap 13 5 - 15    Comment: Performed at [facility], [place] [address] [place], [place] [address]  I-Stat CG4 Lactic Acid, ED     Status: None   Collection Time: 10/22/17  4:25 PM  Result Value Ref Range   Lactic Acid, Venous 1.03 0.5 - 1.9 mmol/L   Dg Tibia/fibula Right  Result Date: 10/22/2017 CLINICAL DATA:  Stepped on earring 4 days ago with redness and swelling extending up right lower leg. Diabetic. EXAM: RIGHT TIBIA AND FIBULA - 2 VIEW COMPARISON:  06/28/2017 FINDINGS: Soft tissues are within normal. No evidence of air within the soft tissues. Right total knee arthroplasty is present and intact. Small inferior calcaneal spur. IMPRESSION: No acute findings. Electronically Signed   By: Marin Olp M.D.   On: 10/22/2017 15:25    Pending Labs [employer] (From admission, onward)    Start     Ordered   Signed and Held  HIV antibody (Routine Testing)  Once,   R     Signed and Held   Signed and Held  CBC  (enoxaparin (LOVENOX)    CrCl >/= 30 ml/min)  Once,   R    Comments:  Baseline for enoxaparin therapy IF NOT ALREADY DRAWN.  Notify MD if PLT < 100 K.    Signed  and Held   Signed and Held  Creatinine, serum  (enoxaparin (LOVENOX)    CrCl >/= 30 ml/min)  Once,   R    Comments:  Baseline for enoxaparin therapy IF NOT ALREADY DRAWN.    Signed and Held  Signed and Held  Creatinine, serum  (enoxaparin (LOVENOX)    CrCl >/= 30 ml/min)  Weekly,   R    Comments:  while on enoxaparin therapy    Signed and Held   Signed and Held  Hemoglobin A1c  Once,   R     Signed and Held   Signed and Held  CBC  Tomorrow morning,   R     Signed and Held   Signed and Held  Basic metabolic panel  Tomorrow morning,   R     Signed and Held          Vitals/Pain Today's Vitals   10/22/17 1400 10/22/17 1500 10/22/17 1700 10/22/17 1704  BP: (!) 160/90 (!) 178/97 (!) 175/94 (!) 175/94  Pulse:  (!) 101 (!) 105 (!) 108  Resp:    18  Temp:      TempSrc:      SpO2:  95% 95% 95%  Weight:      Height:        Isolation Precautions No active isolations  Medications Medications  lactated ringers bolus 1,000 mL (1,000 mLs Intravenous New Bag/Given 10/22/17 1620)  vancomycin (VANCOCIN) 2,000 mg in sodium chloride 0.9 % 500 mL IVPB (0 mg Intravenous Stopped 10/22/17 1706)    Mobility walks with person assist

## 2017-10-22 NOTE — Progress Notes (Signed)
A consult was received from an ED physician for vancomycin per pharmacy dosing.  The patient's profile has been reviewed for ht/wt/allergies/indication/available labs.   A one time order has been placed for vancomycin 2g.    Further antibiotics/pharmacy consults should be ordered by admitting physician if indicated.                       Thank you, Peggyann Juba, PharmD, BCPS 10/22/2017  1:26 PM

## 2017-10-22 NOTE — ED Triage Notes (Signed)
Pt here for possible infection in her right foot. Pt's foot is red, tender to the touch and has obvious swelling compared to the other foot.  Pt reports she stepped on an earring 2 days ago.

## 2017-10-22 NOTE — H&P (Signed)
History and Physical    [name] INO:[id number] DOB: [birth year] DOA: 10/22/2017  PCP: Jeanella Anton, NP Patient coming from: Home  Chief Complaint: Leg pain  HPI: [name] is a 67 y.o. female with medical history significant for CAD, systolic CHF, hypertension, PAF, OSA, DM 2, ILD and biventricular defibrillator who presented with right leg pain.  Patient with poorly controlled diabetes and diabetic neuropathy.  She reports stepping on the earring about 2 days ago.  She presented to ED at that time.  Since it did not look infected, she was discharged home.  However, patient just started noticing worsening pain, swelling and erythema over the right foot.  She denies fever, chills, nausea or vomiting.  Denies chest pain or dyspnea.   In ED, vital signs significant for elevated BP.  CMP not impressive except for hypokalemia to 3.3 and hyperglycemia to 329.  Bicarb 29.  Lactic acid negative.  She had leukocytosis to 23.6.  She had an x-ray of her foot that did not show fracture, malalignment or any radiopaque foreign body.  DG tibia/fibula without acute finding.  She was given vancomycin once. I was called to admit patient for admission for cellulitis. ROS Review of Systems  Constitutional: Negative for chills, fever, malaise/fatigue and weight loss.  HENT: Negative for sore throat.   Eyes: Negative for blurred vision, photophobia and pain.  Respiratory: Negative for cough.   Cardiovascular: Positive for leg swelling. Negative for chest pain and palpitations.  Gastrointestinal: Negative for abdominal pain, blood in stool, diarrhea, melena and vomiting.  Genitourinary: Negative for dysuria.  Musculoskeletal: Negative for myalgias.  Skin: Negative for rash.  Neurological: Negative for speech change, focal weakness, weakness and headaches.  Endo/Heme/Allergies: Does not bruise/bleed easily.  Psychiatric/Behavioral: Negative for depression and substance abuse. The patient is not  nervous/anxious.    PMH Past Medical History:  Diagnosis Date  . Anemia    a. Noted on 07/2012 labs, instructed to f/u PCP.  Marland Kitchen Arthritis    "joints" (11/18/2012)  . CAD (coronary artery disease), native coronary artery    a. Nonobstructive by cath 02/2012 (done because of low EF).  . Chronic bronchitis ([place])    "~ every other year" (11/18/2012)  . Chronic combined systolic and diastolic CHF (congestive heart failure) ([place])    a. 03/05/12 echo:  LVEF 20-25%, moderate LVH , inferior and basal to mid septal akinesis, anterior moderate to severe hypokinesis and grade 2 diastolic dysfunction. b. EF 07/2012: EF still 25% (unclear medication compliance).  . Chronic lower back pain   . Headache(784.0)    "often; maybe not daily" (11/18/2012)  . High cholesterol   . History of noncompliance with medical treatment   . Hypertension   . LBBB (left bundle branch block)   . Orthopnea   . Tobacco abuse   . Type II diabetes mellitus (HCC)    PSH Past Surgical History:  Procedure Laterality Date  . BI-VENTRICULAR IMPLANTABLE CARDIOVERTER DEFIBRILLATOR N/A 11/18/2012   Procedure: BI-VENTRICULAR IMPLANTABLE CARDIOVERTER DEFIBRILLATOR  (CRT-D);  Surgeon: Evans Lance, MD;  Location: [facility] CATH LAB;  Service: Cardiovascular;  Laterality: N/A;  . BI-VENTRICULAR IMPLANTABLE CARDIOVERTER DEFIBRILLATOR  (CRT-D)  11/18/2012  . CARDIAC CATHETERIZATION  03/04/12   nonobstructive CAD, elevated LVEDP and tortuous vessels suggestive of long-standing hypertension  . COLONOSCOPY WITH PROPOFOL N/A 12/12/2016   Procedure: COLONOSCOPY WITH PROPOFOL;  Surgeon: Carol Ada, MD;  Location: [facility] ENDOSCOPY;  Service: Endoscopy;  Laterality: N/A;  . JOINT REPLACEMENT  Bilateral hip and right knee  . LEFT HEART CATH N/A 03/05/2012   Procedure: LEFT HEART CATH;  Surgeon: Larey Dresser, MD;  Location: [facility] CATH LAB;  Service: Cardiovascular;  Laterality: N/A;   Fam HX Family History  Problem Relation Age of Onset  . Heart  disease Neg Hx    Social Hx  reports that she quit smoking about 5 years ago. Her smoking use included cigarettes. She started smoking about 45 years ago. She has a 40.00 pack-year smoking history. She has never used smokeless tobacco. She reports that she does not drink alcohol or use drugs. Allergy Allergies  Allergen Reactions  . Morphine And Related Nausea And Vomiting and Other (See Comments)    Severe nausea  . Penicillins Other (See Comments)    Pt states she has had a pain in her leg since a penicillin injection 2 months ago (reported 08/31/17) Has patient had a PCN reaction causing immediate rash, facial/tongue/throat swelling, SOB or lightheadedness with hypotension: No Has patient had a PCN reaction causing severe rash involving mucus membranes or skin necrosis: No Has patient had a PCN reaction that required hospitalization: No Has patient had a PCN reaction occurring within the last 10 years: Yes If all of the above answers are "NO", t   Home Meds Prior to Admission medications   Medication Sig Start Date End Date Taking? Authorizing Provider  albuterol (PROVENTIL HFA;VENTOLIN HFA) 108 (90 Base) MCG/ACT inhaler Inhale 2 puffs into the lungs every 6 (six) hours as needed for wheezing or shortness of breath. 04/25/16  Yes Ghimire, Henreitta Leber, MD  atorvastatin (LIPITOR) 40 MG tablet Take 1 tablet (40 mg total) by mouth daily. 06/17/17  Yes Larey Dresser, MD  carvedilol (COREG) 25 MG tablet Take 1 tablet (25 mg total) by mouth 2 (two) times daily with a meal. 03/17/17  Yes Anahid Friar, PA-C  ENTRESTO 97-103 MG TAKE 1 TABLET BY MOUTH TWICE DAILY 06/17/17  Yes Bensimhon, Shaune Pascal, MD  furosemide (LASIX) 40 MG tablet Take 1 tablet (40 mg total) by mouth 2 (two) times daily. 05/19/17  Yes Roberta Friar, PA-C  gabapentin (NEURONTIN) 300 MG capsule Take 300 mg by mouth 2 (two) times daily.  06/03/17  Yes [provider]  Insulin Glargine (LANTUS SOLOSTAR) 100  UNIT/ML Solostar Pen Inject 50 Units into the skin daily at 10 pm. 10/20/17  Yes Recardo Evangelist, PA-C  metFORMIN (GLUCOPHAGE) 1000 MG tablet TAKE 1 TABLET(1000 MG) BY MOUTH TWICE DAILY WITH A MEAL Patient taking differently: Take 1,000 mg by mouth 2 (two) times daily.  05/30/16  Yes Ciena Friar, PA-C  naproxen sodium (ALEVE) 220 MG tablet Take 220 mg by mouth daily as needed (for pain or headache).    Yes [provider]  Oxycodone HCl 10 MG TABS Take 10 mg by mouth 4 (four) times daily as needed (for pain).   Yes [provider]  acetaminophen (TYLENOL) 500 MG tablet Take 1 tablet (500 mg total) by mouth every 6 (six) hours as needed. Patient not taking: Reported on 08/31/2017 09/15/15   Leo Grosser, MD  apixaban (ELIQUIS) 5 MG TABS tablet Take 1 tablet (5 mg total) by mouth 2 (two) times daily. Patient not taking: Reported on 10/22/2017 05/01/17   Larey Dresser, MD  diclofenac sodium (VOLTAREN) 1 % GEL Apply 4 g topically 4 (four) times daily. Patient not taking: Reported on 10/22/2017 07/26/17   Duffy Bruce, MD  glucose blood (FREESTYLE TEST  STRIPS) test strip Use as instructed 04/25/16   Ghimire, Henreitta Leber, MD  glucose monitoring kit (FREESTYLE) monitoring kit 1 each by Does not apply route 4 (four) times daily - after meals and at bedtime. 1 month Diabetic Testing Supplies for QAC-QHS accuchecks. 04/25/16   Ghimire, Henreitta Leber, MD  insulin aspart (NOVOLOG FLEXPEN) 100 UNIT/ML FlexPen 0-20 Units, Subcutaneous, 3 times daily with meals CBG < 70: implement hypoglycemia protocol CBG 70 - 120: 0 units CBG 121 - 150: 3 units CBG 151 - 200: 4 units CBG 201 - 250: 7 units CBG 251 - 300: 11 units CBG 301 - 350: 15 units CBG 351 - 400: 20 units CBG > 400: call MD Patient not taking: Reported on 09/15/2017 04/25/16   Jonetta Osgood, MD  Insulin Pen Needle 32G X 8 MM MISC Use as directed 06/18/16   Arbutus Leas, NP  Lancets (FREESTYLE) lancets Use as instructed  06/18/16   Arbutus Leas, NP  [facility] 4 MG/0.1ML LIQD nasal spray kit Place 1 spray into the nose once.  04/24/17   [provider]  ondansetron (ZOFRAN) 4 MG tablet Take 1 tablet (4 mg total) by mouth every 8 (eight) hours as needed for nausea or vomiting. Patient not taking: Reported on 09/15/2017 08/14/17   Domenic Moras, PA-C  potassium chloride 20 MEQ TBCR Take 40 mEq by mouth daily. Patient not taking: Reported on 09/15/2017 10/07/16   Arbutus Leas, NP  spironolactone (ALDACTONE) 25 MG tablet Take 1 tablet (25 mg total) by mouth daily. Patient not taking: Reported on 10/22/2017 05/22/16   Darrick Grinder D, NP    Physical Exam: Vitals:   10/22/17 1500 10/22/17 1700 10/22/17 1704 10/22/17 1741  BP: (!) 178/97 (!) 175/94 (!) 175/94 (!) 193/87  Pulse: (!) 101 (!) 105 (!) 108 (!) 110  Resp:   18 17  Temp:    99.6 F (37.6 C)  TempSrc:    Oral  SpO2: 95% 95% 95% 98%  Weight:      Height:        Constitutional: NAD.  Eyes: lids and conjunctivae normal HENMT: atraumatic. Normocephalic. Hearing grossly intact. No rhinorrhea. Neck: normal. Supple. No mass. Resp: normal effort. Good air movement bilaterally.  Clear to auscultation bilaterally CVS: RRR. S1 & S2 heard. No murmurs.  GI: normal bowel sound. No tenderness. No distention. MSK: Swelling and erythema of the right lower extremity to lower third.  Increased warmth to touch.  Small purulent discharge with pressure from plantar aspect of his first interdigital web. Skin: As above Neuro: Awake. Alert. Oriented appropriately.  No gross deficits Psych: Calm. Normal judgment and insight.      Labs on Admission: I have personally reviewed following labs and imaging studies  CBC: Recent Labs  Lab 10/22/17 1311  WBC 23.6*  NEUTROABS 20.2*  HGB 12.2  HCT 34.9*  MCV 89.5  PLT 160*   Basic Metabolic Panel: Recent Labs  Lab 10/22/17 1311  NA 143  K 3.3*  CL 101  CO2 29  GLUCOSE 329*  BUN 15  CREATININE 1.02*  CALCIUM  9.8   GFR: Estimated Creatinine Clearance: 66.7 mL/min (A) (by C-G formula based on SCr of 1.02 mg/dL (H)). Liver Function Tests: No results for input(s): AST, ALT, ALKPHOS, BILITOT, PROT, ALBUMIN in the last 168 hours. No results for input(s): LIPASE, AMYLASE in the last 168 hours. No results for input(s): AMMONIA in the last 168 hours. Coagulation Profile: No results for input(s): INR,  PROTIME in the last 168 hours. Cardiac Enzymes: No results for input(s): CKTOTAL, CKMB, CKMBINDEX, TROPONINI in the last 168 hours. BNP (last 3 results) No results for input(s): PROBNP in the last 8760 hours. HbA1C: No results for input(s): HGBA1C in the last 72 hours. CBG: Recent Labs  Lab 10/22/17 1821  GLUCAP 322*   Lipid Profile: No results for input(s): CHOL, HDL, LDLCALC, TRIG, CHOLHDL, LDLDIRECT in the last 72 hours. Thyroid Function Tests: No results for input(s): TSH, T4TOTAL, FREET4, T3FREE, THYROIDAB in the last 72 hours. Anemia Panel: No results for input(s): VITAMINB12, FOLATE, FERRITIN, TIBC, IRON, RETICCTPCT in the last 72 hours. Urine analysis:    Component Value Date/Time   COLORURINE YELLOW 10/15/2017 0325   APPEARANCEUR CLOUDY (A) 10/15/2017 0325   LABSPEC 1.015 10/15/2017 0325   PHURINE 6.0 10/15/2017 0325   GLUCOSEU >=500 (A) 10/15/2017 0325   HGBUR NEGATIVE 10/15/2017 0325   BILIRUBINUR NEGATIVE 10/15/2017 0325   KETONESUR NEGATIVE 10/15/2017 0325   PROTEINUR NEGATIVE 10/15/2017 0325   UROBILINOGEN 0.2 10/04/2014 1450   NITRITE POSITIVE (A) 10/15/2017 0325   LEUKOCYTESUR NEGATIVE 10/15/2017 0325    Sepsis Labs: Lactic acid negative )No results found for this or any previous visit (from the past 240 hour(s)).   Radiological Exams on Admission: Dg Tibia/fibula Right  Result Date: 10/22/2017 CLINICAL DATA:  Stepped on earring 4 days ago with redness and swelling extending up right lower leg. Diabetic. EXAM: RIGHT TIBIA AND FIBULA - 2 VIEW COMPARISON:  06/28/2017  FINDINGS: Soft tissues are within normal. No evidence of air within the soft tissues. Right total knee arthroplasty is present and intact. Small inferior calcaneal spur. IMPRESSION: No acute findings. Electronically Signed   By: Marin Olp M.D.   On: 10/22/2017 15:25   [place] Rt Lower Extrem Ltd Soft Tissue Non Vascular  Result Date: 10/22/2017 CLINICAL DATA:  Right foot redness and swelling after an earring got stuck between the 1st and 2nd toes 4 days ago. Clinical concern for abscess. EXAM: ULTRASOUND RIGHT LOWER EXTREMITY LIMITED TECHNIQUE: Ultrasound examination of the lower extremity soft tissues was performed in the area of clinical concern. COMPARISON:  None. FINDINGS: Ultrasound of the area of clinical concern demonstrates diffuse subcutaneous edema which is somewhat more confluent in the deep soft tissues adjacent to the bone. No well-formed abscess was visualized. IMPRESSION: Diffuse subcutaneous edema without a well-formed abscess. If there is continued clinical concern for abscess, pre and postcontrast magnetic resonance imaging of the foot would be recommended. Electronically Signed   By: Claudie Revering M.D.   On: 10/22/2017 19:14    All images have been reviewed by me personally.   EKG: Independently reviewed.   Assessment/Plan Active Problems:   Sleep apnea   Type 2 diabetes mellitus (HCC)   Hypertension   CAD (coronary artery disease), native coronary artery   Chronic systolic CHF (congestive heart failure) (HCC)   Paroxysmal A-fib (HCC)   Atrial fibrillation (HCC)   Right foot infection   Cellulitis in diabetic foot (HCC)   Cellulitis and diabetic foot: Small purulent discharge when squeezed.  She has no constitutional symptoms.  Lactic acid negative. -Ultrasound to exclude abscess -Continue vancomycin -Add meropenem.  Patient with history of anaphylaxis with penicillin. -Pain control  Leukocytosis: Likely due to the above.  Appears well.  No signs of sepsis.  Lactic acid  normal. -Recheck CBC in the morning -Antibiotic as above  DM-2/hyperglycemia: Poorly controlled.  Complicated by neuropathy.  On Lantus 50 units at home -Lantus  25 units nightly -Sliding scale-resistant -CBG ACHS -Check A1c  Paroxysmal atrial fibrillation: Not in RVR.  Supposedly on Eliquis.  Denies taking this. -Risk-benefit discussion the morning.  Chads vasc score 5 -Continue beta-blocker  Systolic CHF: Echo in 0/3491 with EF of 45 to 50% and diffuse hypokinesis.  No cardiopulmonary symptoms.  No signs of fluid overload. -Continue home meds  OSA -CPAP nightly  Other chronic medical conditions stable.  Continue home medications.  DVT prophylaxis: Lovenox Code Status: Full code Family Communication: No family member at bedside Disposition Plan: MedSurg Consults called: None Admission status: Observation   Mercy Riding MD [facility] Pager [phone]  If 7PM-7AM, please contact night-coverage [email or web address] Password [facility]  10/22/2017, 8:06 PM

## 2017-10-22 NOTE — ED Provider Notes (Signed)
[facility] DEPT Provider Note   CSN: [id number] Arrival date & time: 10/22/17  1058     History   Chief Complaint Chief Complaint  Patient presents with  . Foot Pain    HPI [name] is a 67 y.o. female.  HPI  68 year old female with history of CAD, diabetes comes in with chief complaint of worsening foot pain and swelling. Patient reports that 2 days ago her granddaughter noted that patient had an earring embedded in her toes.  Patient came to the ED, and was discharged because there is no signs of infection.  Patient reports that over time she has started developing increased swelling and pain in her right foot.  No nausea, vomiting, fevers, chills. Past Medical History:  Diagnosis Date  . Anemia    a. Noted on 07/2012 labs, instructed to f/u PCP.  Marland Kitchen Arthritis    "joints" (11/18/2012)  . CAD (coronary artery disease), native coronary artery    a. Nonobstructive by cath 02/2012 (done because of low EF).  . Chronic bronchitis ([place])    "~ every other year" (11/18/2012)  . Chronic combined systolic and diastolic CHF (congestive heart failure) ([place])    a. 03/05/12 echo:  LVEF 20-25%, moderate LVH , inferior and basal to mid septal akinesis, anterior moderate to severe hypokinesis and grade 2 diastolic dysfunction. b. EF 07/2012: EF still 25% (unclear medication compliance).  . Chronic lower back pain   . Headache(784.0)    "often; maybe not daily" (11/18/2012)  . High cholesterol   . History of noncompliance with medical treatment   . Hypertension   . LBBB (left bundle branch block)   . Orthopnea   . Tobacco abuse   . Type II diabetes mellitus [facility])     Patient Active Problem List   Diagnosis Date Noted  . Atrial fibrillation ([place]) 02/11/2017  . Paroxysmal A-fib ([place])   . Biventricular automatic implantable cardioverter defibrillator in situ   . Sepsis ([place]) 04/20/2016  . UTI (urinary tract infection) 04/20/2016  . Dyspnea 07/19/2015    . Interstitial lung disease ([place]) 11/20/2014  . SIRS (systemic inflammatory response syndrome) ([place]) 02/03/2014  . IDDM (insulin dependent diabetes mellitus) ([place]) 02/03/2014  . Tachycardia 06/14/2013  . Chronic systolic CHF (congestive heart failure) ([place]) 09/29/2012  . Hypoxia 03/06/2012  . Hypertensive heart disease 03/06/2012  . Nonischemic cardiomyopathy ([place]) 03/06/2012  . Tobacco abuse 03/06/2012  . Type 2 diabetes mellitus ([place]) 03/06/2012  . Hypertension 03/06/2012  . CAD (coronary artery disease), native coronary artery 03/06/2012  . Hypokalemia 03/06/2012  . Acute bronchitis 02/01/2009  . Sleep apnea 02/01/2009  . Chest pain 02/01/2009    Past Surgical History:  Procedure Laterality Date  . BI-VENTRICULAR IMPLANTABLE CARDIOVERTER DEFIBRILLATOR N/A 11/18/2012   Procedure: BI-VENTRICULAR IMPLANTABLE CARDIOVERTER DEFIBRILLATOR  (CRT-D);  Surgeon: Evans Lance, MD;  Location: [facility] CATH LAB;  Service: Cardiovascular;  Laterality: N/A;  . BI-VENTRICULAR IMPLANTABLE CARDIOVERTER DEFIBRILLATOR  (CRT-D)  11/18/2012  . CARDIAC CATHETERIZATION  03/04/12   nonobstructive CAD, elevated LVEDP and tortuous vessels suggestive of long-standing hypertension  . COLONOSCOPY WITH PROPOFOL N/A 12/12/2016   Procedure: COLONOSCOPY WITH PROPOFOL;  Surgeon: Carol Ada, MD;  Location: [facility] ENDOSCOPY;  Service: Endoscopy;  Laterality: N/A;  . JOINT REPLACEMENT     Bilateral hip and right knee  . LEFT HEART CATH N/A 03/05/2012   Procedure: LEFT HEART CATH;  Surgeon: Larey Dresser, MD;  Location: [facility] CATH LAB;  Service: Cardiovascular;  Laterality: N/A;  OB History   None      Home Medications    Prior to Admission medications   Medication Sig Start Date End Date Taking? Authorizing Provider  albuterol (PROVENTIL HFA;VENTOLIN HFA) 108 (90 Base) MCG/ACT inhaler Inhale 2 puffs into the lungs every 6 (six) hours as needed for wheezing or shortness of breath. 04/25/16  Yes Ghimire, Henreitta Leber,  MD  atorvastatin (LIPITOR) 40 MG tablet Take 1 tablet (40 mg total) by mouth daily. 06/17/17  Yes Larey Dresser, MD  carvedilol (COREG) 25 MG tablet Take 1 tablet (25 mg total) by mouth 2 (two) times daily with a meal. 03/17/17  Yes Marli Friar, PA-C  ENTRESTO 97-103 MG TAKE 1 TABLET BY MOUTH TWICE DAILY 06/17/17  Yes Bensimhon, Shaune Pascal, MD  furosemide (LASIX) 40 MG tablet Take 1 tablet (40 mg total) by mouth 2 (two) times daily. 05/19/17  Yes Alivya Friar, PA-C  gabapentin (NEURONTIN) 300 MG capsule Take 300 mg by mouth 2 (two) times daily.  06/03/17  Yes [provider]  Insulin Glargine (LANTUS SOLOSTAR) 100 UNIT/ML Solostar Pen Inject 50 Units into the skin daily at 10 pm. 10/20/17  Yes Recardo Evangelist, PA-C  metFORMIN (GLUCOPHAGE) 1000 MG tablet TAKE 1 TABLET(1000 MG) BY MOUTH TWICE DAILY WITH A MEAL Patient taking differently: Take 1,000 mg by mouth 2 (two) times daily.  05/30/16  Yes Christle Friar, PA-C  naproxen sodium (ALEVE) 220 MG tablet Take 220 mg by mouth daily as needed (for pain or headache).    Yes [provider]  Oxycodone HCl 10 MG TABS Take 10 mg by mouth 4 (four) times daily as needed (for pain).   Yes [provider]  acetaminophen (TYLENOL) 500 MG tablet Take 1 tablet (500 mg total) by mouth every 6 (six) hours as needed. Patient not taking: Reported on 08/31/2017 09/15/15   Leo Grosser, MD  apixaban (ELIQUIS) 5 MG TABS tablet Take 1 tablet (5 mg total) by mouth 2 (two) times daily. Patient not taking: Reported on 10/22/2017 05/01/17   Larey Dresser, MD  diclofenac sodium (VOLTAREN) 1 % GEL Apply 4 g topically 4 (four) times daily. Patient not taking: Reported on 10/22/2017 07/26/17   Duffy Bruce, MD  glucose blood (FREESTYLE TEST STRIPS) test strip Use as instructed 04/25/16   Jonetta Osgood, MD  glucose monitoring kit (FREESTYLE) monitoring kit 1 each by Does not apply route 4 (four) times daily - after meals  and at bedtime. 1 month Diabetic Testing Supplies for QAC-QHS accuchecks. 04/25/16   Ghimire, Henreitta Leber, MD  insulin aspart (NOVOLOG FLEXPEN) 100 UNIT/ML FlexPen 0-20 Units, Subcutaneous, 3 times daily with meals CBG < 70: implement hypoglycemia protocol CBG 70 - 120: 0 units CBG 121 - 150: 3 units CBG 151 - 200: 4 units CBG 201 - 250: 7 units CBG 251 - 300: 11 units CBG 301 - 350: 15 units CBG 351 - 400: 20 units CBG > 400: call MD Patient not taking: Reported on 09/15/2017 04/25/16   Jonetta Osgood, MD  Insulin Pen Needle 32G X 8 MM MISC Use as directed 06/18/16   Arbutus Leas, NP  Lancets (FREESTYLE) lancets Use as instructed 06/18/16   Arbutus Leas, NP  [facility] 4 MG/0.1ML LIQD nasal spray kit Place 1 spray into the nose once.  04/24/17   [provider]  ondansetron (ZOFRAN) 4 MG tablet Take 1 tablet (4 mg total) by mouth every 8 (eight) hours as  needed for nausea or vomiting. Patient not taking: Reported on 09/15/2017 08/14/17   Domenic Moras, PA-C  potassium chloride 20 MEQ TBCR Take 40 mEq by mouth daily. Patient not taking: Reported on 09/15/2017 10/07/16   Arbutus Leas, NP  spironolactone (ALDACTONE) 25 MG tablet Take 1 tablet (25 mg total) by mouth daily. Patient not taking: Reported on 10/22/2017 05/22/16   [place], NP    Family History Family History  Problem Relation Age of Onset  . Heart disease Neg Hx     Social History Social History   Tobacco Use  . Smoking status: Former Smoker    Packs/day: 1.00    Years: 40.00    Pack years: 40.00    Types: Cigarettes    Start date: 06/23/1972    Last attempt to quit: 03/26/2012    Years since quitting: 5.5  . Smokeless tobacco: Never Used  Substance Use Topics  . Alcohol use: No  . Drug use: No     Allergies   Morphine and related and Penicillins   Review of Systems Review of Systems  Constitutional: Positive for activity change.  Gastrointestinal: Negative for nausea and vomiting.  Musculoskeletal:  Positive for myalgias.  Skin: Positive for rash.  Allergic/Immunologic: Positive for immunocompromised state.     Physical Exam Updated Vital Signs BP (!) 174/97 (BP Location: Right Arm)   Pulse 100   Temp 99.7 F (37.6 C) (Oral)   Resp 17   Ht '5\' 7"'  (1.702 m)   Wt 97.1 kg   SpO2 99%   BMI 33.52 kg/m   Physical Exam  Constitutional: She is oriented to person, place, and time. She appears well-developed.  HENT:  Head: Normocephalic and atraumatic.  Eyes: EOM are normal.  Neck: Normal range of motion. Neck supple.  Cardiovascular: Normal rate.  Pulmonary/Chest: Effort normal.  Abdominal: Bowel sounds are normal.  Musculoskeletal:  Right lower extremity examination reveals erythematous and edematous right foot.  Patients edema extends up to the distal one third of the tibia.  There is tenderness to palpation without any crepitus.  2+ dorsalis pedis.  Neurological: She is alert and oriented to person, place, and time.  Skin: Skin is warm and dry. Rash noted.  Nursing note and vitals reviewed.    ED Treatments / Results  Labs (all labs ordered are listed, but only abnormal results are displayed) Labs Reviewed  CBC WITH DIFFERENTIAL/PLATELET - Abnormal; Notable for the following components:      Result Value   WBC 23.6 (*)    HCT 34.9 (*)    Platelets 445 (*)    Neutro Abs 20.2 (*)    Monocytes Absolute 1.4 (*)    All other components within normal limits  BASIC METABOLIC PANEL - Abnormal; Notable for the following components:   Potassium 3.3 (*)    Glucose, Bld 329 (*)    Creatinine, Ser 1.02 (*)    GFR calc non Af Amer 57 (*)    All other components within normal limits    EKG None  Radiology No results found.  Procedures Procedures (including critical care time)  Medications Ordered in ED Medications  vancomycin (VANCOCIN) 2,000 mg in sodium chloride 0.9 % 500 mL IVPB (2,000 mg Intravenous New Bag/Given 10/22/17 1344)     Initial Impression /  Assessment and Plan / ED Course  I have reviewed the triage vital signs and the nursing notes.  Pertinent labs & imaging results that were available during my care of  the patient were reviewed by me and considered in my medical decision making (see chart for details).     68 year old female comes in with chief complaint of right foot pain and swelling. Patient is diabetic and has a history of CHF and A. Fib. It appears that in a matter of 1 day patient has had significant swelling and erythema to her right foot.  Her symptoms started after she stepped on in hearing.  We will admit patient for further care. Patient's white count is over 25.  Based on our evaluation patient is not septic at this time. IV vancomycin will be started.  Final Clinical Impressions(s) / ED Diagnoses   Final diagnoses:  Cellulitis of right leg    ED Discharge Orders    None       Varney Biles, MD 10/22/17 1453

## 2017-10-22 NOTE — Progress Notes (Signed)
Pt. placed on CPAP per order, hx. of OSA although does not have one at home yet, stated she has had Sleep Study in which was observed while sleeping, agreed to try full face mask, remains on room air, humidifier filled with s/w, pressure started out at 5 cmh20 with max of 14 cmh20, initially tolerating well, RN made aware.

## 2017-10-22 NOTE — Progress Notes (Signed)
Pharmacy Antibiotic Note  [name] is a 67 y.o. female admitted on 10/22/2017 with CAD, diabetes comes in with chief complaint of worsening foot pain and swelling.  2 days ago her granddaughter noted that pt had an earring embedded in her toes.  Pharmacy has been consulted for vancomycin merrem dosing for cellulitis.  Plan: -Vancomycin 1gm IV x 1 in ED then 1250mg  q24h (AUC 513.6, Scr 1.02), will start before 24 hours in lieu of bolus - merrem 1gm IV q8h - daily Scr - follow renal function, cultures and clinical course  Height: 5\' 7"  (170.2 cm) Weight: 214 lb (97.1 kg) IBW/kg (Calculated) : 61.6  Temp (24hrs), Avg:99.7 F (37.6 C), Min:99.7 F (37.6 C), Max:99.7 F (37.6 C)  Recent Labs  Lab 10/22/17 1311 10/22/17 1625  WBC 23.6*  --   CREATININE 1.02*  --   LATICACIDVEN  --  1.03    Estimated Creatinine Clearance: 66.7 mL/min (A) (by C-G formula based on SCr of 1.02 mg/dL (H)).    Allergies  Allergen Reactions  . Morphine And Related Nausea And Vomiting and Other (See Comments)    Severe nausea  . Penicillins Other (See Comments)    Pt states she has had a pain in her leg since a penicillin injection 2 months ago (reported 08/31/17) Has patient had a PCN reaction causing immediate rash, facial/tongue/throat swelling, SOB or lightheadedness with hypotension: No Has patient had a PCN reaction causing severe rash involving mucus membranes or skin necrosis: No Has patient had a PCN reaction that required hospitalization: No Has patient had a PCN reaction occurring within the last 10 years: Yes If all of the above answers are "NO", t    Antimicrobials this admission: 9/26 vanc >> 9/26 merrem >>  Dose adjustments this admission:    Thank you for allowing pharmacy to be a part of this patient's care.  Dolly Rias RPh 10/22/2017, 4:42 PM Pager [phone]

## 2017-10-23 DIAGNOSIS — I110 Hypertensive heart disease with heart failure: Secondary | ICD-10-CM | POA: Diagnosis present

## 2017-10-23 DIAGNOSIS — L089 Local infection of the skin and subcutaneous tissue, unspecified: Secondary | ICD-10-CM

## 2017-10-23 DIAGNOSIS — E785 Hyperlipidemia, unspecified: Secondary | ICD-10-CM | POA: Diagnosis present

## 2017-10-23 DIAGNOSIS — E11628 Type 2 diabetes mellitus with other skin complications: Secondary | ICD-10-CM | POA: Diagnosis present

## 2017-10-23 DIAGNOSIS — I10 Essential (primary) hypertension: Secondary | ICD-10-CM

## 2017-10-23 DIAGNOSIS — W268XXA Contact with other sharp object(s), not elsewhere classified, initial encounter: Secondary | ICD-10-CM | POA: Diagnosis not present

## 2017-10-23 DIAGNOSIS — M1610 Unilateral primary osteoarthritis, unspecified hip: Secondary | ICD-10-CM | POA: Diagnosis present

## 2017-10-23 DIAGNOSIS — L03119 Cellulitis of unspecified part of limb: Secondary | ICD-10-CM

## 2017-10-23 DIAGNOSIS — Z96643 Presence of artificial hip joint, bilateral: Secondary | ICD-10-CM | POA: Diagnosis present

## 2017-10-23 DIAGNOSIS — J849 Interstitial pulmonary disease, unspecified: Secondary | ICD-10-CM | POA: Diagnosis present

## 2017-10-23 DIAGNOSIS — G4733 Obstructive sleep apnea (adult) (pediatric): Secondary | ICD-10-CM | POA: Diagnosis present

## 2017-10-23 DIAGNOSIS — E1140 Type 2 diabetes mellitus with diabetic neuropathy, unspecified: Secondary | ICD-10-CM | POA: Diagnosis present

## 2017-10-23 DIAGNOSIS — I5022 Chronic systolic (congestive) heart failure: Secondary | ICD-10-CM | POA: Diagnosis not present

## 2017-10-23 DIAGNOSIS — I2510 Atherosclerotic heart disease of native coronary artery without angina pectoris: Secondary | ICD-10-CM | POA: Diagnosis present

## 2017-10-23 DIAGNOSIS — G4730 Sleep apnea, unspecified: Secondary | ICD-10-CM

## 2017-10-23 DIAGNOSIS — E669 Obesity, unspecified: Secondary | ICD-10-CM | POA: Diagnosis present

## 2017-10-23 DIAGNOSIS — D649 Anemia, unspecified: Secondary | ICD-10-CM | POA: Diagnosis present

## 2017-10-23 DIAGNOSIS — R51 Headache: Secondary | ICD-10-CM | POA: Diagnosis present

## 2017-10-23 DIAGNOSIS — I447 Left bundle-branch block, unspecified: Secondary | ICD-10-CM | POA: Diagnosis present

## 2017-10-23 DIAGNOSIS — Z96651 Presence of right artificial knee joint: Secondary | ICD-10-CM | POA: Diagnosis present

## 2017-10-23 DIAGNOSIS — I480 Paroxysmal atrial fibrillation: Secondary | ICD-10-CM

## 2017-10-23 DIAGNOSIS — G8929 Other chronic pain: Secondary | ICD-10-CM | POA: Diagnosis present

## 2017-10-23 DIAGNOSIS — M545 Low back pain: Secondary | ICD-10-CM | POA: Diagnosis present

## 2017-10-23 DIAGNOSIS — S91331A Puncture wound without foreign body, right foot, initial encounter: Secondary | ICD-10-CM | POA: Diagnosis present

## 2017-10-23 DIAGNOSIS — I5042 Chronic combined systolic (congestive) and diastolic (congestive) heart failure: Secondary | ICD-10-CM | POA: Diagnosis present

## 2017-10-23 DIAGNOSIS — J42 Unspecified chronic bronchitis: Secondary | ICD-10-CM | POA: Diagnosis present

## 2017-10-23 DIAGNOSIS — E1165 Type 2 diabetes mellitus with hyperglycemia: Secondary | ICD-10-CM | POA: Diagnosis present

## 2017-10-23 DIAGNOSIS — L03115 Cellulitis of right lower limb: Secondary | ICD-10-CM | POA: Diagnosis present

## 2017-10-23 DIAGNOSIS — R739 Hyperglycemia, unspecified: Secondary | ICD-10-CM

## 2017-10-23 DIAGNOSIS — E876 Hypokalemia: Secondary | ICD-10-CM | POA: Diagnosis present

## 2017-10-23 LAB — MAGNESIUM: Magnesium: 1.7 mg/dL (ref 1.7–2.4)

## 2017-10-23 LAB — CBC
HCT: 30.5 % — ABNORMAL LOW (ref 36.0–46.0)
Hemoglobin: 10.6 g/dL — ABNORMAL LOW (ref 12.0–15.0)
MCH: 31.3 pg (ref 26.0–34.0)
MCHC: 34.8 g/dL (ref 30.0–36.0)
MCV: 90 fL (ref 78.0–100.0)
Platelets: 376 10*3/uL (ref 150–400)
RBC: 3.39 MIL/uL — ABNORMAL LOW (ref 3.87–5.11)
RDW: 12.7 % (ref 11.5–15.5)
WBC: 17.2 10*3/uL — ABNORMAL HIGH (ref 4.0–10.5)

## 2017-10-23 LAB — GLUCOSE, CAPILLARY
Glucose-Capillary: 158 mg/dL — ABNORMAL HIGH (ref 70–99)
Glucose-Capillary: 328 mg/dL — ABNORMAL HIGH (ref 70–99)
Glucose-Capillary: 139 mg/dL — ABNORMAL HIGH (ref 70–99)
Glucose-Capillary: 159 mg/dL — ABNORMAL HIGH (ref 70–99)

## 2017-10-23 LAB — BASIC METABOLIC PANEL
Anion gap: 7 (ref 5–15)
BUN: 11 mg/dL (ref 8–23)
CO2: 29 mmol/L (ref 22–32)
Calcium: 8.8 mg/dL — ABNORMAL LOW (ref 8.9–10.3)
Chloride: 107 mmol/L (ref 98–111)
Creatinine, Ser: 0.67 mg/dL (ref 0.44–1.00)
GFR calc Af Amer: >60 mL/min (ref >60)
GFR calc non Af Amer: >60 mL/min (ref >60)
Glucose, Bld: 167 mg/dL — ABNORMAL HIGH (ref 70–99)
Potassium: 3.2 mmol/L — ABNORMAL LOW (ref 3.5–5.1)
Sodium: 143 mmol/L (ref 135–145)

## 2017-10-23 LAB — HIV ANTIBODY (ROUTINE TESTING W REFLEX): HIV Screen 4th Generation wRfx: NONREACTIVE

## 2017-10-23 LAB — PHOSPHORUS: Phosphorus: 3.2 mg/dL (ref 2.5–4.6)

## 2017-10-23 MED ORDER — POTASSIUM CHLORIDE CRYS ER 20 MEQ PO TBCR
40.0000 meq | EXTENDED_RELEASE_TABLET | Freq: Two times a day (BID) | ORAL | Status: AC
  Administered 2017-10-23: 40 meq via ORAL
  Filled 2017-10-23 (×2): qty 2

## 2017-10-23 MED ORDER — ONDANSETRON HCL 4 MG/2ML IJ SOLN
4.0000 mg | Freq: Four times a day (QID) | INTRAMUSCULAR | Status: DC | PRN
  Administered 2017-10-23: 4 mg via INTRAVENOUS
  Filled 2017-10-23: qty 2

## 2017-10-23 MED ORDER — INSULIN GLARGINE 100 UNIT/ML ~~LOC~~ SOLN
30.0000 [IU] | Freq: Every day | SUBCUTANEOUS | Status: DC
  Administered 2017-10-23 – 2017-10-24 (×2): 30 [IU] via SUBCUTANEOUS
  Filled 2017-10-23 (×3): qty 0.3

## 2017-10-23 NOTE — Progress Notes (Addendum)
Diabetic Coordinator was paged regarding the order for a consult.  Diabetic Coordinator returned page and advised that she will reach out to the Diabetic Coordinator here at [facility] to see the pt.

## 2017-10-23 NOTE — Progress Notes (Addendum)
Inpatient Diabetes Program Recommendations  AACE/ADA: New Consensus Statement on Inpatient Glycemic Control (2015)  Target Ranges:  Prepandial:   less than 140 mg/dL      Peak postprandial:   less than 180 mg/dL (1-2 hours)      Critically ill patients:  140 - 180 mg/dL   Results for [name], [name] (MRN [id number]) as of 10/23/2017 09:24  Ref. Range 10/22/2017 18:21 10/22/2017 20:41 10/23/2017 07:25  Glucose-Capillary Latest Ref Range: 70 - 99 mg/dL 322 (H)  15 units NOVOLOG  244 (H)  2 units NOVOLOG +  25 units LANTUS 158 (H)  4 units NOVOLOG    Results for [name], [name] (MRN [id number]) as of 10/23/2017 09:24  Ref. Range 10/22/2017 13:11  Hemoglobin A1C Latest Ref Range: 4.8 - 5.6 % 10.9 (H)  Average glucose 266 mg/dl    Admit with: Cellulitis LE  History: DM, CHF  Home DM Meds: Lantus 50 units QHS       Metformin 1000 mg BID       Novolog SSI (patient NOT taking)  Current Orders: Lantus 25 units QHS     Novolog Resistant Correction Scale/ SSI (0-20 units) TID AC + HS     PCP: Jeanella Anton, NP   CBG improved this AM to 158 mg/dl.  Current A1c of 10.9% shows poor glucose control at home.  Will attempt to speak with pt today to discuss home DM care regimen and offer assistance.  Note per nursing notes that patient complaining of nausea at 11am today.  Not sure how much she will be eating today given her issues with nausea.    MD- If patient continues to have nausea and is unable to eat, may consider changing Novolog SSI coverage to Q4 hours (currently ordered TID AC + HS)  May also consider increasing Lantus slightly to 30 units QHS for tonight   Addendum 1:45pm- Met with pt today.  Spoke with patient about her current A1c of 10.9%.  Explained what an A1c is and what it measures.  Reminded patient that her goal A1c is 7% or less per ADA standards to prevent both acute and long-term complications.  Explained to patient the extreme importance of good glucose  control at home.  Encouraged patient to check her CBGs at least bid at home (fasting and another check within the day) and to record all CBGs in a logbook for her PCP to review.  Patient stated she sees PCP: Jeanella Anton, NP for regular medical care.   Last saw PCP on 09/12/2017.  Has follow up appt on 11/11/2017.  Encouraged pt to discuss current A1c of 10.9% with her PCP as she may need some adjustments in her medications for home.  Discussed with pt that her current infection may have increased her CBG levels on admission, however, she has been having elevated CBGs at home for a while now as evidenced by her current elevated A1c.      --Will follow patient during hospitalization--  Wyn Quaker RN, MSN, CDE Diabetes Coordinator Inpatient Glycemic Control Team Team Pager: [phone] (8a-5p)

## 2017-10-23 NOTE — Progress Notes (Signed)
PROGRESS NOTE    [name]  ZYS:[id number] DOB: 1950-05-05 DOA: 10/22/2017 PCP: Jeanella Anton, NP   Brief Narrative:  HPI per Dr. Wendee Beavers on 10/22/17 [name] is a 67 y.o. female with medical history significant for CAD, systolic CHF, hypertension, PAF, OSA, DM 2, ILD and biventricular defibrillator who presented with right leg pain.  Patient with poorly controlled diabetes and diabetic neuropathy.  She reports stepping on the earring about 2 days ago.  She presented to ED at that time.  Since it did not look infected, she was discharged home.  However, patient just started noticing worsening pain, swelling and erythema over the right foot.  She denies fever, chills, nausea or vomiting.  Denies chest pain or dyspnea.   In ED, vital signs significant for elevated BP.  CMP not impressive except for hypokalemia to 3.3 and hyperglycemia to 329.  Bicarb 29.  Lactic acid negative.  She had leukocytosis to 23.6.  She had an x-ray of her foot that did not show fracture, malalignment or any radiopaque foreign body.  DG tibia/fibula without acute finding.  She was given vancomycin once. I was called to admit patient for admission for cellulitis.  **Thinks that her foot looks slightly better as red.  Was nauseous earlier so Zofran was started  Assessment & Plan:   Active Problems:   Sleep apnea   Type 2 diabetes mellitus (HCC)   Hypertension   CAD (coronary artery disease), native coronary artery   Chronic systolic CHF (congestive heart failure) (HCC)   Paroxysmal A-fib (HCC)   Atrial fibrillation (HCC)   Right foot infection   Cellulitis in diabetic foot (HCC)   Hyperglycemia  Cellulitis in Diabetic Foot -Small purulent discharge when squeezed.  She has no constitutional symptoms.  Lactic acid negative. -Ultrasound to exclude abscess showed diffuse subcutaneous edema without a well-formed abscess -If not improving will get an MRI of the foot pre-and postcontrast -Continue  IV Vancomycin for now -Added meropenem.  Patient with history of anaphylaxis with penicillin but this is questionable as this may not be a true allergy and true anaphylaxis -May de-escalate to Cefepime and Flagyl in a.m. -Pain control with oxycodone 5 to 10 mg p.o. every 4 PRN for severe pain and breakthrough pain  Leukocytosis -Likely due to the above.  Appears well.  No signs of sepsis.  Lactic acid normal at 1.03 -Recheck CBC in the morning showed improving leukocytosis WBC went from 20.6 now 17.2 -Antibiotic as above -Team to monitor for signs and symptoms of infection -Repeat CBC in a.m.  DM-2/Hyperglycemia -Poorly controlled.  Complicated by neuropathy.  On Lantus 50 units at home -Started Lantus 25 units nightly and will increase to 30 units subcu nightly -Continue with Novolog sliding scale-Resistant AC/HS -Continue with gabapentin 300 mg p.o. twice daily for neuropathy -CBG ACHS -Check A1c this visit and was 10.9 -CBGs have been ranging from 158 at the 28 -Diabetes education coordinator consulted for further evaluation recommendations  Paroxysmal Atrial Fibrillation  -Not in RVR.  Supposedly on Eliquis.  Denies taking this. -Risk-benefit discussion will need to be done as Chads vasc score 5 -Continue Carvedilol 25 mg p.o. twice daily  Chronic Systolic CHF -Echo in 09/3233 with EF of 45 to 50% and diffuse hypokinesis.   -Currently not No cardiopulmonary symptoms.  No signs of fluid overload. -Continue home meds of carvedilol 25 mg p.o. twice daily, furosemide 40 mg p.o. twice daily with potassium supplementation 40 mg twice daily, Entresto 1  tab p.o. twice daily -Strict I's and O's, daily weights, -Continue to monitor volume status very carefully  Hypokalemia -In the setting of diuresis -Patient's potassium level this morning was 3.2 -Replete with 40 mg p.o. twice daily -Continue monitor replete as necessary -Repeat CMP in the a.m.  OSA -C/w CPAP  nightly  Hyperlipidemia -Continue with Atorvastatin 40 mg p.o. Nightly  Obesity -Estimated body mass index is 33.04 kg/m as calculated from the following:   Height as of this encounter: 5\' 7"  (1.702 m).   Weight as of this encounter: 95.7 kg. -Weight Loss Counseling Given   DVT prophylaxis: Heparin 40 mg subcu every 24 Code Status: FULL CODE Family Communication: No family present at bedside Disposition Plan: Anticipate D/C in the next 24-48 hours  Consultants:   None   Procedures: None   Antimicrobials:  Anti-infectives (From admission, onward)   Start     Dose/Rate Route Frequency Ordered Stop   10/23/17 0600  vancomycin (VANCOCIN) 1,250 mg in sodium chloride 0.9 % 250 mL IVPB     1,250 mg 166.7 mL/hr over 90 Minutes Intravenous Every 24 hours 10/22/17 1755     10/22/17 1800  meropenem (MERREM) 1 g in sodium chloride 0.9 % 100 mL IVPB     1 g 200 mL/hr over 30 Minutes Intravenous Every 8 hours 10/22/17 1717     10/22/17 1330  vancomycin (VANCOCIN) 2,000 mg in sodium chloride 0.9 % 500 mL IVPB     2,000 mg 250 mL/hr over 120 Minutes Intravenous  Once 10/22/17 1323 10/22/17 1706     Subjective: Seen And examined at bedside and states that her foot seems slightly better.  No chest pain, lightheadedness or dizziness.  States she stepped on an earring and did not realize that her foot was infected.  Thinks the foot is not as red or swollen.  No other concerns or complaints at this time  Objective: Vitals:   10/23/17 0525 10/23/17 0823 10/23/17 0941 10/23/17 1409  BP: 140/88 (!) 145/66 126/68 (!) 119/59  Pulse: 94 84 74 78  Resp: 18  16 16   Temp: 98.1 F (36.7 C)  97.8 F (36.6 C) 98.2 F (36.8 C)  TempSrc: Oral  Oral Oral  SpO2: 94%  94% 100%  Weight: 95.7 kg     Height:        Intake/Output Summary (Last 24 hours) at 10/23/2017 1550 Last data filed at 10/23/2017 1500 Gross per 24 hour  Intake 1448.68 ml  Output -  Net 1448.68 ml   Filed Weights    10/22/17 1114 10/23/17 0525  Weight: 97.1 kg 95.7 kg   Examination: Physical Exam:  Constitutional: WN/WD obese AAF in NAD and appears calm and comfortable Eyes: Lids and conjunctivae normal, sclerae anicteric  ENMT: External Ears, Nose appear normal. Grossly normal hearing. Mucous membranes are moist. Neck: Appears normal, supple, no cervical masses, normal ROM, no appreciable thyromegaly; no JVD Respiratory: Diminished to auscultation bilaterally, no wheezing, rales, rhonchi or crackles. Normal respiratory effort and patient is not tachypenic. No accessory muscle use.  Cardiovascular: RRR, no murmurs / rubs / gallops. S1 and S2 auscultated. Right Foot and Leg swollen and erythematous Abdomen: Soft, non-tender, Distended 2/2 body habitus. No masses palpated. No appreciable hepatosplenomegaly. Bowel sounds positive x4.  GU: Deferred. Musculoskeletal: No clubbing / cyanosis of digits/nails. No joint deformity upper and lower extremities. Good ROM, no contractures. Normal strength and muscle tone.  Skin: Right foot warm and erythematous with swelling up to the mid  shin. Has a small wound inbetween Right big toe and second toe. No induration; Warm and dry.  Neurologic: CN 2-12 grossly intact with no focal deficits. Romberg sign and cerebellar reflexes not assessed.  Psychiatric: Normal judgment and insight. Alert and oriented x 3. Normal mood and appropriate affect.   Data Reviewed: I have personally reviewed following labs and imaging studies  CBC: Recent Labs  Lab 10/22/17 1311 10/23/17 0509  WBC 23.6* 17.2*  NEUTROABS 20.2*  --   HGB 12.2 10.6*  HCT 34.9* 30.5*  MCV 89.5 90.0  PLT 445* 798   Basic Metabolic Panel: Recent Labs  Lab 10/22/17 1311 10/23/17 0509  NA 143 143  K 3.3* 3.2*  CL 101 107  CO2 29 29  GLUCOSE 329* 167*  BUN 15 11  CREATININE 1.02* 0.67  CALCIUM 9.8 8.8*  MG  --  1.7  PHOS  --  3.2   GFR: Estimated Creatinine Clearance: 84.3 mL/min (by C-G  formula based on SCr of 0.67 mg/dL). Liver Function Tests: No results for input(s): AST, ALT, ALKPHOS, BILITOT, PROT, ALBUMIN in the last 168 hours. No results for input(s): LIPASE, AMYLASE in the last 168 hours. No results for input(s): AMMONIA in the last 168 hours. Coagulation Profile: No results for input(s): INR, PROTIME in the last 168 hours. Cardiac Enzymes: No results for input(s): CKTOTAL, CKMB, CKMBINDEX, TROPONINI in the last 168 hours. BNP (last 3 results) No results for input(s): PROBNP in the last 8760 hours. HbA1C: Recent Labs    10/22/17 1311  HGBA1C 10.9*   CBG: Recent Labs  Lab 10/22/17 1821 10/22/17 2041 10/23/17 0725 10/23/17 1251  GLUCAP 322* 244* 158* 328*   Lipid Profile: No results for input(s): CHOL, HDL, LDLCALC, TRIG, CHOLHDL, LDLDIRECT in the last 72 hours. Thyroid Function Tests: No results for input(s): TSH, T4TOTAL, FREET4, T3FREE, THYROIDAB in the last 72 hours. Anemia Panel: No results for input(s): VITAMINB12, FOLATE, FERRITIN, TIBC, IRON, RETICCTPCT in the last 72 hours. Sepsis Labs: Recent Labs  Lab 10/22/17 1625  LATICACIDVEN 1.03    No results found for this or any previous visit (from the past 240 hour(s)).   Radiology Studies: Dg Tibia/fibula Right  Result Date: 10/22/2017 CLINICAL DATA:  Stepped on earring 4 days ago with redness and swelling extending up right lower leg. Diabetic. EXAM: RIGHT TIBIA AND FIBULA - 2 VIEW COMPARISON:  06/28/2017 FINDINGS: Soft tissues are within normal. No evidence of air within the soft tissues. Right total knee arthroplasty is present and intact. Small inferior calcaneal spur. IMPRESSION: No acute findings. Electronically Signed   By: Marin Olp M.D.   On: 10/22/2017 15:25   [place] Rt Lower Extrem Ltd Soft Tissue Non Vascular  Result Date: 10/22/2017 CLINICAL DATA:  Right foot redness and swelling after an earring got stuck between the 1st and 2nd toes 4 days ago. Clinical concern for abscess.  EXAM: ULTRASOUND RIGHT LOWER EXTREMITY LIMITED TECHNIQUE: Ultrasound examination of the lower extremity soft tissues was performed in the area of clinical concern. COMPARISON:  None. FINDINGS: Ultrasound of the area of clinical concern demonstrates diffuse subcutaneous edema which is somewhat more confluent in the deep soft tissues adjacent to the bone. No well-formed abscess was visualized. IMPRESSION: Diffuse subcutaneous edema without a well-formed abscess. If there is continued clinical concern for abscess, pre and postcontrast magnetic resonance imaging of the foot would be recommended. Electronically Signed   By: Claudie Revering M.D.   On: 10/22/2017 19:14   Scheduled Meds: . atorvastatin  40 mg Oral q1800  . carvedilol  25 mg Oral BID WC  . enoxaparin (LOVENOX) injection  40 mg Subcutaneous Q24H  . furosemide  40 mg Oral BID  . gabapentin  300 mg Oral BID  . insulin aspart  0-20 Units Subcutaneous TID WC  . insulin aspart  0-5 Units Subcutaneous QHS  . insulin glargine  30 Units Subcutaneous QHS  . potassium chloride  40 mEq Oral BID  . sacubitril-valsartan  1 tablet Oral BID   Continuous Infusions: . sodium chloride 500 mL (10/22/17 1839)  . meropenem (MERREM) IV Stopped (10/23/17 1032)  . vancomycin 1,250 mg (10/23/17 0555)    LOS: 0 days   Kerney Elbe, DO [facility] PAGER is on [place]  If 7PM-7AM, please contact night-coverage [email or web address] Password [email or web address] 10/23/2017, 3:50 PM

## 2017-10-23 NOTE — Progress Notes (Signed)
At 1000, I was notified by the pt that she is nauseous and refused to take morning meds and/or PRN pain med until she's no longer nauseous. There is currently no PRN nausea meds ordered. Alfredia Ferguson, MD was paged at [phone] regarding the pt's request for nausea medicine.

## 2017-10-23 NOTE — Consult Note (Signed)
[facility] Nurse wound consult note.  Patient in [id number]. No family present. Reason for Consult:  Right foot wound between great toe and second toe. Wound type: Per photo patient provided on her phone and her explanation of how it occurred, it was a puncture wound from an earring. Today there is no open wound.  Injury POA: Yes Drainage (amount, consistency, odor) Brown drainage between toes.  Periwound: Right great toe edematous, and erythematous. Right foot with edema.  Dressing procedure/placement/frequency: Wash between right great toe and second toe with soap and water, dry thoroughly. Place Xeroform gauze ([place]# [id number]) between toes, secure with narrow kerlex. Change daily. Monitor the wound area(s) for worsening of condition such as: Signs/symptoms of infection,  Increase in size,  Development of or worsening of odor, Development of pain, or increased pain at the affected locations.  Notify the medical team if any of these develop.  Thank you for the consult.  Discussed plan of care with the patient and bedside nurse.  [facility] nurse will not follow at this time.  Please re-consult the [facility] team if needed.  Val Riles, RN, MSN, CWOCN, CNS-BC, pager [phone]

## 2017-10-24 LAB — COMPREHENSIVE METABOLIC PANEL
ALT: 10 U/L (ref 0–44)
AST: 14 U/L — ABNORMAL LOW (ref 15–41)
Albumin: 3 g/dL — ABNORMAL LOW (ref 3.5–5.0)
Alkaline Phosphatase: 73 U/L (ref 38–126)
Anion gap: 8 (ref 5–15)
BUN: 11 mg/dL (ref 8–23)
CO2: 29 mmol/L (ref 22–32)
Calcium: 8.8 mg/dL — ABNORMAL LOW (ref 8.9–10.3)
Chloride: 105 mmol/L (ref 98–111)
Creatinine, Ser: 0.8 mg/dL (ref 0.44–1.00)
GFR calc Af Amer: >60 mL/min (ref >60)
GFR calc non Af Amer: >60 mL/min (ref >60)
Glucose, Bld: 186 mg/dL — ABNORMAL HIGH (ref 70–99)
Potassium: 3.7 mmol/L (ref 3.5–5.1)
Sodium: 142 mmol/L (ref 135–145)
Total Bilirubin: 0.5 mg/dL (ref 0.3–1.2)
Total Protein: 6.7 g/dL (ref 6.5–8.1)

## 2017-10-24 LAB — MAGNESIUM: Magnesium: 1.7 mg/dL (ref 1.7–2.4)

## 2017-10-24 LAB — CBC WITH DIFFERENTIAL/PLATELET
Basophils Absolute: 0 10*3/uL (ref 0.0–0.1)
Basophils Relative: 0 %
Eosinophils Absolute: 0.2 10*3/uL (ref 0.0–0.7)
Eosinophils Relative: 2 %
HCT: 31.7 % — ABNORMAL LOW (ref 36.0–46.0)
Hemoglobin: 10.8 g/dL — ABNORMAL LOW (ref 12.0–15.0)
Lymphocytes Relative: 18 %
Lymphs Abs: 2.2 10*3/uL (ref 0.7–4.0)
MCH: 30.9 pg (ref 26.0–34.0)
MCHC: 34.1 g/dL (ref 30.0–36.0)
MCV: 90.6 fL (ref 78.0–100.0)
Monocytes Absolute: 1.8 10*3/uL — ABNORMAL HIGH (ref 0.1–1.0)
Monocytes Relative: 14 %
Neutro Abs: 8.5 10*3/uL — ABNORMAL HIGH (ref 1.7–7.7)
Neutrophils Relative %: 66 %
Platelets: 415 10*3/uL — ABNORMAL HIGH (ref 150–400)
RBC: 3.5 MIL/uL — ABNORMAL LOW (ref 3.87–5.11)
RDW: 12.8 % (ref 11.5–15.5)
WBC: 12.8 10*3/uL — ABNORMAL HIGH (ref 4.0–10.5)

## 2017-10-24 LAB — PHOSPHORUS: Phosphorus: 2.9 mg/dL (ref 2.5–4.6)

## 2017-10-24 LAB — GLUCOSE, CAPILLARY
Glucose-Capillary: 177 mg/dL — ABNORMAL HIGH (ref 70–99)
Glucose-Capillary: 287 mg/dL — ABNORMAL HIGH (ref 70–99)
Glucose-Capillary: 301 mg/dL — ABNORMAL HIGH (ref 70–99)
Glucose-Capillary: 82 mg/dL (ref 70–99)
Glucose-Capillary: 160 mg/dL — ABNORMAL HIGH (ref 70–99)

## 2017-10-24 MED ORDER — SODIUM CHLORIDE 0.9 % IV SOLN
3.0000 g | Freq: Three times a day (TID) | INTRAVENOUS | Status: DC
  Administered 2017-10-24 – 2017-10-25 (×2): 3 g via INTRAVENOUS
  Filled 2017-10-24 (×3): qty 3

## 2017-10-24 MED ORDER — FUROSEMIDE 10 MG/ML IJ SOLN
40.0000 mg | Freq: Once | INTRAMUSCULAR | Status: AC
  Administered 2017-10-24: 40 mg via INTRAVENOUS
  Filled 2017-10-24: qty 4

## 2017-10-24 MED ORDER — SODIUM CHLORIDE 0.9 % IV SOLN
100.0000 mg | Freq: Two times a day (BID) | INTRAVENOUS | Status: DC
  Administered 2017-10-24: 100 mg via INTRAVENOUS
  Filled 2017-10-24 (×2): qty 100

## 2017-10-24 NOTE — Progress Notes (Signed)
PROGRESS NOTE    [name]  HYQ:[id number] DOB: 1950-12-18 DOA: 10/22/2017 PCP: Jeanella Anton, NP   Brief Narrative:  HPI per Dr. Wendee Beavers on 10/22/17 [name] is a 67 y.o. female with medical history significant for CAD, systolic CHF, hypertension, PAF, OSA, DM 2, ILD and biventricular defibrillator who presented with right leg pain.  Patient with poorly controlled diabetes and diabetic neuropathy.  She reports stepping on the earring about 2 days ago.  She presented to ED at that time.  Since it did not look infected, she was discharged home.  However, patient just started noticing worsening pain, swelling and erythema over the right foot.  She denies fever, chills, nausea or vomiting.  Denies chest pain or dyspnea.   In ED, vital signs significant for elevated BP.  CMP not impressive except for hypokalemia to 3.3 and hyperglycemia to 329.  Bicarb 29.  Lactic acid negative.  She had leukocytosis to 23.6.  She had an x-ray of her foot that did not show fracture, malalignment or any radiopaque foreign body.  DG tibia/fibula without acute finding.  She was given vancomycin once. I was called to admit patient for admission for cellulitis.  **Thinks that her foot looks slightly better as red.  Was nauseous earlier so Zofran was started.  Patient's antibiotics are being de-escalated and will be changed to IV Unasyn and IV doxycycline and then transition to p.o. Augmentin and doxycycline in the a.m.  Patient was given a dose of IV Lasix today for her swelling  Assessment & Plan:   Active Problems:   Sleep apnea   Type 2 diabetes mellitus (HCC)   Hypertension   CAD (coronary artery disease), native coronary artery   Chronic systolic CHF (congestive heart failure) (HCC)   Paroxysmal A-fib (HCC)   Atrial fibrillation (HCC)   Right foot infection   Cellulitis in diabetic foot (HCC)   Hyperglycemia  Cellulitis in Diabetic Foot -Small purulent discharge when squeezed.  She has  no constitutional symptoms.  Lactic acid negative. -Ultrasound to exclude abscess showed diffuse subcutaneous edema without a well-formed abscess -If not improving will get an MRI of the foot pre-and postcontrast -Was on IV vancomycin and IV meropenem which have now been de-escalated to IV Unasyn and IV doxycycline -Further de-escalate to p.o. Augmentin and p.o. doxycycline and discharge -Pain control with oxycodone 5 to 10 mg p.o. every 4 PRN for severe pain and breakthrough pain -We will give 1 dose of IV Lasix 40 mg in addition to her home p.o. 40 mg twice daily -Ambulate as tolerated -Dissipate discharging in a.m. on p.o. regimen for 10 days total  Leukocytosis -Likely due to the above.  Appears well.  No signs of sepsis.  Lactic acid normal at 1.03 -Recheck CBC in the morning showed improving leukocytosis WBC went from 20.6 now 12.8 -Antibiotic as above -Continue to monitor for signs and symptoms of infection -Repeat CBC in a.m.  DM-2/Hyperglycemia -Poorly controlled.  Complicated by neuropathy.  On Lantus 50 units at home -Started Lantus 25 units nightly and will increase to 30 units subcu nightly -Continue with Novolog sliding scale-Resistant AC/HS -Continue with gabapentin 300 mg p.o. twice daily for neuropathy -CBG ACHS -Check A1c this visit and was 10.9 -CBGs have been ranging from 139-287 -Diabetes education coordinator consulted for further evaluation recommendations  Paroxysmal Atrial Fibrillation  -Not in RVR.  Supposedly on Eliquis.  Denies taking this. -Risk-benefit discussion will need to be done as Chads vasc score 5 -  Continue Carvedilol 25 mg p.o. twice daily  Chronic Systolic CHF -Echo in 07/7410 with EF of 45 to 50% and diffuse hypokinesis.   -Currently not No cardiopulmonary symptoms.  No signs of fluid overload. -Continue home meds of carvedilol 25 mg p.o. twice daily, furosemide 40 mg p.o. twice daily with potassium supplementation 40 mg twice daily,  Entresto 1 tab p.o. twice daily -Strict I's and O's, daily weights, patient is +1.410 L -Continue to monitor volume status very carefully  Hypokalemia -In the setting of diuresis -Patient's potassium level this morning was improved from 22-3.7 -Replete with 40 mg p.o. twice daily -Continue monitor replete as necessary -Repeat CMP in the a.m.  OSA -C/w CPAP nightly  Hyperlipidemia -Continue with Atorvastatin 40 mg p.o. Nightly  Obesity -Estimated body mass index is 32.89 kg/m as calculated from the following:   Height as of this encounter: 5\' 7"  (1.702 m).   Weight as of this encounter: 95.3 kg. -Weight Loss Counseling Given   Normocytic Anemia -Patient's hemoglobin/hematocrit went from 12.2/34.9 on admission is now 10.8/31.7 -Continue to monitor for signs and symptoms of bleeding -Check anemia panel in a.m. -Repeat CBC in the a.m.  DVT prophylaxis: Heparin 40 mg subcu every 24 Code Status: FULL CODE Family Communication: No family present at bedside Disposition Plan: Anticipate D/C in the next 24-48 hours  Consultants:   None   Procedures: None   Antimicrobials:  Anti-infectives (From admission, onward)   Start     Dose/Rate Route Frequency Ordered Stop   10/24/17 2200  doxycycline (VIBRAMYCIN) 100 mg in sodium chloride 0.9 % 250 mL IVPB     100 mg 125 mL/hr over 120 Minutes Intravenous Every 12 hours 10/24/17 1409     10/24/17 2000  Ampicillin-Sulbactam (UNASYN) 3 g in sodium chloride 0.9 % 100 mL IVPB     3 g 200 mL/hr over 30 Minutes Intravenous Every 8 hours 10/24/17 1409     10/23/17 0600  vancomycin (VANCOCIN) 1,250 mg in sodium chloride 0.9 % 250 mL IVPB  Status:  Discontinued     1,250 mg 166.7 mL/hr over 90 Minutes Intravenous Every 24 hours 10/22/17 1755 10/24/17 1409   10/22/17 1800  meropenem (MERREM) 1 g in sodium chloride 0.9 % 100 mL IVPB  Status:  Discontinued     1 g 200 mL/hr over 30 Minutes Intravenous Every 8 hours 10/22/17 1717 10/24/17  1409   10/22/17 1330  vancomycin (VANCOCIN) 2,000 mg in sodium chloride 0.9 % 500 mL IVPB     2,000 mg 250 mL/hr over 120 Minutes Intravenous  Once 10/22/17 1323 10/22/17 1706     Subjective: Seen And examined at bedside and states foot pain is better and redness is improved however she is still concerned about swelling.  No chest pain, lightheadedness or dizziness.  No nausea, vomiting today.  Denies any other concerns or complaints at this time and anticipating discharge in a.m.  Objective: Vitals:   10/23/17 2148 10/24/17 0524 10/24/17 0700 10/24/17 1401  BP: 127/64 (!) 147/82  107/64  Pulse: 81 94  99  Resp: 16 16  16   Temp: 98.4 F (36.9 C) 99.8 F (37.7 C)  98.1 F (36.7 C)  TempSrc: Oral Oral  Oral  SpO2: 97% 99%  (!) 88%  Weight:   95.3 kg   Height:        Intake/Output Summary (Last 24 hours) at 10/24/2017 1537 Last data filed at 10/24/2017 1100 Gross per 24 hour  Intake 1372 ml  Output 1400 ml  Net -28 ml   Filed Weights   10/22/17 1114 10/23/17 0525 10/24/17 0700  Weight: 97.1 kg 95.7 kg 95.3 kg   Examination: Physical Exam:  Constitutional: Well-nourished, well-developed obese African-American female currently no acute distress sitting in bed with her right leg elevated Eyes: Sclera anicteric.  Lids are controlled with normal ENMT: External ears and nose appear normal.  Grossly normal hearing.  Mucous members are moist Neck: Appears supple with no JVD Respiratory: Diminished to auscultation bilaterally no appreciable wheezing, rales, rhonchi.  Patient not tachypneic using accessory muscle breathe Cardiovascular: Regular rate and rhythm.  No appreciable murmurs, rubs, gallops.  Has 1+ lower extremity edema in the right pedal area Abdomen: Soft, nontender, distended secondary body habitus.  Bowel sounds present in 4 quadrants. GU: Deferred Musculoskeletal: No clubbing or cyanosis.  No joint deformities noted in the extremities Skin: Foot is warm and  erythematous but not as much yesterday but still remains somewhat swollen.  Swelling is slightly improved but still pretty significant.  Has a small wound in between the big toe and the second toe.  No induration.  Skin is warm and dry Neurologic: Nerves II through XII grossly intact no appreciable focal deficits Psychiatric: Slightly agitated this morning after talking to family member on the phone.  Patient is awake, alert and oriented x3.  Intact judgment insight  Data Reviewed: I have personally reviewed following labs and imaging studies  CBC: Recent Labs  Lab 10/22/17 1311 10/23/17 0509 10/24/17 0354  WBC 23.6* 17.2* 12.8*  NEUTROABS 20.2*  --  8.5*  HGB 12.2 10.6* 10.8*  HCT 34.9* 30.5* 31.7*  MCV 89.5 90.0 90.6  PLT 445* 376 213*   Basic Metabolic Panel: Recent Labs  Lab 10/22/17 1311 10/23/17 0509 10/24/17 0354  NA 143 143 142  K 3.3* 3.2* 3.7  CL 101 107 105  CO2 29 29 29   GLUCOSE 329* 167* 186*  BUN 15 11 11   CREATININE 1.02* 0.67 0.80  CALCIUM 9.8 8.8* 8.8*  MG  --  1.7 1.7  PHOS  --  3.2 2.9   GFR: Estimated Creatinine Clearance: 84.2 mL/min (by C-G formula based on SCr of 0.8 mg/dL). Liver Function Tests: Recent Labs  Lab 10/24/17 0354  AST 14*  ALT 10  ALKPHOS 73  BILITOT 0.5  PROT 6.7  ALBUMIN 3.0*   No results for input(s): LIPASE, AMYLASE in the last 168 hours. No results for input(s): AMMONIA in the last 168 hours. Coagulation Profile: No results for input(s): INR, PROTIME in the last 168 hours. Cardiac Enzymes: No results for input(s): CKTOTAL, CKMB, CKMBINDEX, TROPONINI in the last 168 hours. BNP (last 3 results) No results for input(s): PROBNP in the last 8760 hours. HbA1C: Recent Labs    10/22/17 1311  HGBA1C 10.9*   CBG: Recent Labs  Lab 10/23/17 1251 10/23/17 1649 10/23/17 2225 10/24/17 0732 10/24/17 1300  GLUCAP 328* 139* 159* 177* 287*   Lipid Profile: No results for input(s): CHOL, HDL, LDLCALC, TRIG, CHOLHDL,  LDLDIRECT in the last 72 hours. Thyroid Function Tests: No results for input(s): TSH, T4TOTAL, FREET4, T3FREE, THYROIDAB in the last 72 hours. Anemia Panel: No results for input(s): VITAMINB12, FOLATE, FERRITIN, TIBC, IRON, RETICCTPCT in the last 72 hours. Sepsis Labs: Recent Labs  Lab 10/22/17 1625  LATICACIDVEN 1.03    No results found for this or any previous visit (from the past 240 hour(s)).   Radiology Studies: [place] Soft Tissue Non Vascular  Result Date: 10/22/2017 CLINICAL DATA:  Right foot redness and swelling after an earring got stuck between the 1st and 2nd toes 4 days ago. Clinical concern for abscess. EXAM: ULTRASOUND RIGHT LOWER EXTREMITY LIMITED TECHNIQUE: Ultrasound examination of the lower extremity soft tissues was performed in the area of clinical concern. COMPARISON:  None. FINDINGS: Ultrasound of the area of clinical concern demonstrates diffuse subcutaneous edema which is somewhat more confluent in the deep soft tissues adjacent to the bone. No well-formed abscess was visualized. IMPRESSION: Diffuse subcutaneous edema without a well-formed abscess. If there is continued clinical concern for abscess, pre and postcontrast magnetic resonance imaging of the foot would be recommended. Electronically Signed   By: Claudie Revering M.D.   On: 10/22/2017 19:14   Scheduled Meds: . atorvastatin  40 mg Oral q1800  . carvedilol  25 mg Oral BID WC  . enoxaparin (LOVENOX) injection  40 mg Subcutaneous Q24H  . furosemide  40 mg Oral BID  . gabapentin  300 mg Oral BID  . insulin aspart  0-20 Units Subcutaneous TID WC  . insulin aspart  0-5 Units Subcutaneous QHS  . insulin glargine  30 Units Subcutaneous QHS  . sacubitril-valsartan  1 tablet Oral BID   Continuous Infusions: . sodium chloride 10 mL/hr at 10/24/17 1000  . ampicillin-sulbactam (UNASYN) IV    . doxycycline (VIBRAMYCIN) IV      LOS: 1 day   Kerney Elbe, DO [facility] PAGER is on  AMION  If 7PM-7AM, please contact night-coverage [email or web address] Password [employer] Room [employer] 10/24/2017, 3:37 PM

## 2017-10-24 NOTE — Progress Notes (Signed)
MD notified of patient's dry cough and patient's request for cough suppressant.   Carlynn Herald, RN 10/24/17 8:00pm

## 2017-10-24 NOTE — Progress Notes (Signed)
Pt continues to refuse CPAP QHS, RT to monitor and assess as needed.  

## 2017-10-25 DIAGNOSIS — E1165 Type 2 diabetes mellitus with hyperglycemia: Secondary | ICD-10-CM

## 2017-10-25 DIAGNOSIS — Z794 Long term (current) use of insulin: Secondary | ICD-10-CM

## 2017-10-25 LAB — COMPREHENSIVE METABOLIC PANEL
ALT: 11 U/L (ref 0–44)
AST: 14 U/L — ABNORMAL LOW (ref 15–41)
Albumin: 2.9 g/dL — ABNORMAL LOW (ref 3.5–5.0)
Alkaline Phosphatase: 65 U/L (ref 38–126)
Anion gap: 10 (ref 5–15)
BUN: 12 mg/dL (ref 8–23)
CO2: 30 mmol/L (ref 22–32)
Calcium: 8.7 mg/dL — ABNORMAL LOW (ref 8.9–10.3)
Chloride: 104 mmol/L (ref 98–111)
Creatinine, Ser: 0.86 mg/dL (ref 0.44–1.00)
GFR calc Af Amer: >60 mL/min (ref >60)
GFR calc non Af Amer: >60 mL/min (ref >60)
Glucose, Bld: 150 mg/dL — ABNORMAL HIGH (ref 70–99)
Potassium: 3.3 mmol/L — ABNORMAL LOW (ref 3.5–5.1)
Sodium: 144 mmol/L (ref 135–145)
Total Bilirubin: 0.5 mg/dL (ref 0.3–1.2)
Total Protein: 6.5 g/dL (ref 6.5–8.1)

## 2017-10-25 LAB — CBC WITH DIFFERENTIAL/PLATELET
Basophils Absolute: 0.1 10*3/uL (ref 0.0–0.1)
Basophils Relative: 1 %
Eosinophils Absolute: 0.2 10*3/uL (ref 0.0–0.7)
Eosinophils Relative: 2 %
HCT: 30.4 % — ABNORMAL LOW (ref 36.0–46.0)
Hemoglobin: 10.3 g/dL — ABNORMAL LOW (ref 12.0–15.0)
Lymphocytes Relative: 27 %
Lymphs Abs: 3 10*3/uL (ref 0.7–4.0)
MCH: 30.6 pg (ref 26.0–34.0)
MCHC: 33.9 g/dL (ref 30.0–36.0)
MCV: 90.2 fL (ref 78.0–100.0)
Monocytes Absolute: 1.1 10*3/uL — ABNORMAL HIGH (ref 0.1–1.0)
Monocytes Relative: 10 %
Neutro Abs: 6.6 10*3/uL (ref 1.7–7.7)
Neutrophils Relative %: 60 %
Platelets: 409 10*3/uL — ABNORMAL HIGH (ref 150–400)
RBC: 3.37 MIL/uL — ABNORMAL LOW (ref 3.87–5.11)
RDW: 12.5 % (ref 11.5–15.5)
WBC: 11 10*3/uL — ABNORMAL HIGH (ref 4.0–10.5)

## 2017-10-25 LAB — MAGNESIUM: Magnesium: 1.4 mg/dL — ABNORMAL LOW (ref 1.7–2.4)

## 2017-10-25 LAB — GLUCOSE, CAPILLARY
Glucose-Capillary: 123 mg/dL — ABNORMAL HIGH (ref 70–99)
Glucose-Capillary: 260 mg/dL — ABNORMAL HIGH (ref 70–99)

## 2017-10-25 LAB — PHOSPHORUS: Phosphorus: 3.3 mg/dL (ref 2.5–4.6)

## 2017-10-25 MED ORDER — DOXYCYCLINE HYCLATE 100 MG PO TABS
100.0000 mg | ORAL_TABLET | Freq: Two times a day (BID) | ORAL | Status: DC
  Administered 2017-10-25: 100 mg via ORAL
  Filled 2017-10-25: qty 1

## 2017-10-25 MED ORDER — BENZONATATE 200 MG PO CAPS: 200.0000 mg | ORAL_CAPSULE | Freq: Two times a day (BID) | ORAL | 0 refills | Status: DC | PRN

## 2017-10-25 MED ORDER — POLYETHYLENE GLYCOL 3350 17 G PO PACK: 17.0000 g | PACK | Freq: Every day | ORAL | 0 refills | Status: DC | PRN

## 2017-10-25 MED ORDER — MENTHOL 3 MG MT LOZG
1.0000 | LOZENGE | OROMUCOSAL | Status: DC | PRN
  Administered 2017-10-25: 3 mg via ORAL
  Filled 2017-10-25 (×3): qty 9

## 2017-10-25 MED ORDER — BENZONATATE 100 MG PO CAPS
200.0000 mg | ORAL_CAPSULE | Freq: Two times a day (BID) | ORAL | Status: DC | PRN
  Administered 2017-10-25: 200 mg via ORAL
  Filled 2017-10-25: qty 2

## 2017-10-25 MED ORDER — POTASSIUM CHLORIDE CRYS ER 20 MEQ PO TBCR
40.0000 meq | EXTENDED_RELEASE_TABLET | Freq: Two times a day (BID) | ORAL | Status: DC
  Administered 2017-10-25: 40 meq via ORAL
  Filled 2017-10-25: qty 2

## 2017-10-25 MED ORDER — MAGNESIUM SULFATE 50 % IJ SOLN
3.0000 g | Freq: Once | INTRAVENOUS | Status: AC
  Administered 2017-10-25: 3 g via INTRAVENOUS
  Filled 2017-10-25: qty 6

## 2017-10-25 MED ORDER — OXYMETAZOLINE HCL 0.05 % NA SOLN
1.0000 | Freq: Two times a day (BID) | NASAL | Status: DC
  Filled 2017-10-25: qty 15

## 2017-10-25 MED ORDER — OXYMETAZOLINE HCL 0.05 % NA SOLN: 1.0000 | Freq: Two times a day (BID) | NASAL | 0 refills | Status: DC

## 2017-10-25 MED ORDER — AMOXICILLIN-POT CLAVULANATE 875-125 MG PO TABS
1.0000 | ORAL_TABLET | Freq: Two times a day (BID) | ORAL | Status: DC
  Administered 2017-10-25: 1 via ORAL
  Filled 2017-10-25: qty 1

## 2017-10-25 MED ORDER — AMOXICILLIN-POT CLAVULANATE 875-125 MG PO TABS: 1.0000 | ORAL_TABLET | Freq: Two times a day (BID) | ORAL | 0 refills | Status: AC

## 2017-10-25 MED ORDER — DOXYCYCLINE HYCLATE 100 MG PO CAPS: 100.0000 mg | ORAL_CAPSULE | Freq: Two times a day (BID) | ORAL | 0 refills | Status: AC

## 2017-10-25 NOTE — Plan of Care (Signed)
  Problem: Clinical Measurements: Goal: Ability to maintain clinical measurements within normal limits will improve Outcome: Progressing Goal: Will remain free from infection Outcome: Progressing   Problem: Nutrition: Goal: Adequate nutrition will be maintained Outcome: Progressing   Problem: Elimination: Goal: Will not experience complications related to bowel motility Outcome: Progressing Goal: Will not experience complications related to urinary retention Outcome: Progressing   Problem: Clinical Measurements: Goal: Ability to avoid or minimize complications of infection will improve Outcome: Progressing   Problem: Skin Integrity: Goal: Skin integrity will improve Outcome: Progressing   Carlynn Herald, RN 10/25/17 1:00 AM

## 2017-10-25 NOTE — Discharge Summary (Signed)
Physician Discharge Summary  [name] VWU:[id number] DOB: [birth year] DOA: 10/22/2017  PCP: Jeanella Anton, NP  Admit date: 10/22/2017 Discharge date: 10/25/2017  Admitted From: Home Disposition: Home  Recommendations for Outpatient Follow-up:  1. Follow up with PCP in 1-2 weeks 2. Follow-up with Cardiology in outpatient setting 3. Please obtain CMP/CBC, Mag, Phos in one week  Home Health: No Equipment/Devices: None  Discharge Condition: Stable CODE STATUS: FULL CODE  Diet recommendation: Heart Healthy Carb Modified diet  Brief/Interim Summary: HPI per Dr. Wendee Beavers on 10/22/17 Flonnie Hailstone a 67 y.o.femalewith medical history significant forCAD, systolic CHF, hypertension, PAF, OSA, DM 2, ILD and biventricular defibrillator who presented with right leg pain.  Patient with poorly controlled diabetes and diabetic neuropathy.She reports stepping on the earring about 2days ago. She presented to ED at that time. Since it did not look infected, she was discharged home.However, patient just started noticing worsening pain, swelling and erythema over the right foot. She denies fever, chills,nausea or vomiting. Denies chest pain or dyspnea.   In ED, vital signs significant for elevated BP. CMP not impressive except for hypokalemia to 3.3 and hyperglycemia to 329. Bicarb 29. Lactic acid negative. She had leukocytosis to 23.6. She had an x-ray of her footthat did not show fracture, malalignment or any radiopaque foreign body. DG tibia/fibula without acute finding. She was given vancomycin once.I was called to admit patient for admission for cellulitis.  **Thinks that her foot looks slightly better as red.  Was nauseous earlier so Zofran was started.  Patient's antibiotics are being de-escalated and were changed to IV Unasyn and IV doxycycline and then transitioned to p.o. Augmentin and doxycycline this AM.  Patient was given a dose of IV Lasix today for her  swelling and improved.  She is deemed medically stable to be discharged home and will need to follow-up with primary care physician as well as cardiology outpatient setting.  Of note patient is supposed to be on apixaban as an outpatient but states no longer taking this and it is recommended that she go back on Eliquis due to her high CHADS2-VASc and risk of stroke.  She will need further evaluation and titration of blood blood sugar medications along with evaluation with cardiology for resuming her Eliquis and following up for systolic CHF and proximal afibrillation.  Discharge Diagnoses:  Active Problems:   Sleep apnea   Type 2 diabetes mellitus (HCC)   Hypertension   CAD (coronary artery disease), native coronary artery   Chronic systolic CHF (congestive heart failure) (HCC)   Paroxysmal A-fib (HCC)   Atrial fibrillation (HCC)   Right foot infection   Cellulitis in diabetic foot (HCC)   Hyperglycemia  Cellulitis in Diabetic Foot, improving  -Small purulent discharge when squeezed. She has no constitutional symptoms. Lactic acid negative. -Ultrasound to exclude abscess showed diffuse subcutaneous edema without a well-formed abscess -If not improving will get an MRI of the foot pre-and postcontrast -Was on IV vancomycin and IV meropenem which have now been de-escalated to IV Unasyn and IV doxycycline yesterday  -Further de-escalate to p.o. Augmentin and p.o. doxycycline and discharge for 10 day total -Pain control with oxycodone 5 to 10 mg p.o. every 4 PRN for severe pain and breakthrough pain; Resume Home Pain Regimen -We will give 1 dose of IV Lasix 40 mg in addition to her home p.o. 40 mg twice daily -Ambulate as tolerated -Discharging this AM. on p.o. regimen for 10 days total  Leukocytosis, improving  -Likely due  to the above.Appears well. No signs of sepsis. Lactic acid normal at 1.03 -Recheck CBC in the morning showed improving leukocytosis WBC went from 20.6 now  11.0 -Antibiotics as above -Continue to monitor for signs and symptoms of infection -Repeat CBC as an outpatient.  DM-2/Hyperglycemia -Poorly controlled. Complicated by neuropathy.On Lantus 50 units at home and will continue  -Resume Home Metformin Dose  -Started Lantus 25 units nightly and will increase to 30 units subcu nightly -Continue with Novolog sliding scale-Resistant AC/HS -Continue with gabapentin 300 mg p.o. twice daily for neuropathy -CBGACHS -Check A1c this visit and was 10.9 -CBGs have been ranging from 82-301 -Diabetes education coordinator consulted for further evaluation recommendations -Follow up with PCP as an outpatient   Paroxysmal Atrial Fibrillation -Not in RVR. Supposedly on Eliquis and is on [facility] but denies taking this and MAR states she is no longer taking. -Risk-benefit discussion done as Chads vascscore 5 and recommend continuing Apixaban -Continue Carvedilol 25 mg p.o. twice daily -Resume Apixaban Home dose and follow up with PCP and Cardiology for further evaluation   Chronic Systolic CHF -Echo in 0/7371 with EF of 45 to 50% and diffuse hypokinesis. -Currently no cardiopulmonary symptoms. No signs of fluid overload. -Continue home meds of carvedilol 25 mg p.o. twice daily, furosemide 40 mg p.o. twice daily with potassium supplementation 40 mg twice daily, Entresto 1 tab p.o. twice daily; Supposed to be on Spironolactone but no longer taking  -Strict I's and O's, daily weights, patient is +2.605 L -Continue to monitor volume status very carefully -Follow up with Cardiology as an outpatient   Hypokalemia -In the setting of diuresis -Patient's potassium level this morning was 3.3 -Replete with 40 mg p.o. twice daily; Resume Home Potassium Dose  -Continue monitor and replete as necessary -Repeat CMP in the a.m.  OSA -C/w CPAP nightly  Hyperlipidemia -Continue with Atorvastatin 40 mg p.o. Nightly  Obesity -Estimated body mass index  is 32.89 kg/m as calculated from the following:   Height as of this encounter: _0  (1.702 m).   Weight as of this encounter: 95.3 kg. -Weight Loss Counseling Given   Normocytic Anemia -Patient's hemoglobin/hematocrit went from 12.2/34.9 on admission is now 10.3/30.4 -Continue to monitor for signs and symptoms of bleeding -Check Aemia panel as an outpatient  -Repeat CBC in the a.m.  Hypomagnesemia -Patient's Magnesium level this morning was 1.4 -Replete with IV magnesium sulfate 3 g -Continue monitor replete as necessary -Repeat magnesium level in outpatient  Discharge Instructions Discharge Instructions    Call MD for:  difficulty breathing, headache or visual disturbances   Complete by:  As directed    Call MD for:  extreme fatigue   Complete by:  As directed    Call MD for:  hives   Complete by:  As directed    Call MD for:  persistant dizziness or light-headedness   Complete by:  As directed    Call MD for:  persistant nausea and vomiting   Complete by:  As directed    Call MD for:  redness, tenderness, or signs of infection (pain, swelling, redness, odor or green/yellow discharge around incision site)   Complete by:  As directed    Call MD for:  severe uncontrolled pain   Complete by:  As directed    Call MD for:  temperature >100.4   Complete by:  As directed    Diet - low sodium heart healthy   Complete by:  As directed    Diet Carb  Modified   Complete by:  As directed    Discharge instructions   Complete by:  As directed    You were cared for by a hospitalist during your hospital stay. If you have any questions about your discharge medications or the care you received while you were in the hospital after you are discharged, you can call the unit and ask to speak with the hospitalist on call if the hospitalist that took care of you is not available. Once you are discharged, your primary care physician will handle any further medical issues. Please note that NO  REFILLS for any discharge medications will be authorized once you are discharged, as it is imperative that you return to your primary care physician (or establish a relationship with a primary care physician if you do not have one) for your aftercare needs so that they can reassess your need for medications and monitor your lab values.  Follow up with PCP within 1 week. Take all medications as prescribed. If symptoms change or worsen please return to the ED for evaluation   Increase activity slowly   Complete by:  As directed      Allergies as of 10/25/2017      Reactions   Morphine And Related Nausea And Vomiting, Other (See Comments)   Severe nausea   Penicillins Other (See Comments)   Pt states she has had a pain in her leg since a penicillin injection 2 months ago (reported 08/31/17).  Has tolerated amoxicillin oral.  Has patient had a PCN reaction causing immediate rash, facial/tongue/throat swelling, SOB or lightheadedness with hypotension: No Has patient had a PCN reaction causing severe rash involving mucus membranes or skin necrosis: No Has patient had a PCN reaction that required hospitalization: No Has patient had a PCN reaction occurring within the last 10 years: Yes If a      Medication List    STOP taking these medications   NARCAN 4 MG/0.1ML Liqd nasal spray kit Generic drug:  naloxone     TAKE these medications   acetaminophen 500 MG tablet Commonly known as:  TYLENOL Take 1 tablet (500 mg total) by mouth every 6 (six) hours as needed.   albuterol 108 (90 Base) MCG/ACT inhaler Commonly known as:  PROVENTIL HFA;VENTOLIN HFA Inhale 2 puffs into the lungs every 6 (six) hours as needed for wheezing or shortness of breath.   amoxicillin-clavulanate 875-125 MG tablet Commonly known as:  AUGMENTIN Take 1 tablet by mouth every 12 (twelve) hours for 6 days.   apixaban 5 MG Tabs tablet Commonly known as:  ELIQUIS Take 1 tablet (5 mg total) by mouth 2 (two) times daily.    atorvastatin 40 MG tablet Commonly known as:  LIPITOR Take 1 tablet (40 mg total) by mouth daily.   benzonatate 200 MG capsule Commonly known as:  TESSALON Take 1 capsule (200 mg total) by mouth 2 (two) times daily as needed for cough.   carvedilol 25 MG tablet Commonly known as:  COREG Take 1 tablet (25 mg total) by mouth 2 (two) times daily with a meal.   diclofenac sodium 1 % Gel Commonly known as:  VOLTAREN Apply 4 g topically 4 (four) times daily.   doxycycline 100 MG capsule Commonly known as:  VIBRAMYCIN Take 1 capsule (100 mg total) by mouth 2 (two) times daily for 6 days.   ENTRESTO 97-103 MG Generic drug:  sacubitril-valsartan TAKE 1 TABLET BY MOUTH TWICE DAILY   freestyle lancets Use as instructed  furosemide 40 MG tablet Commonly known as:  LASIX Take 1 tablet (40 mg total) by mouth 2 (two) times daily.   gabapentin 300 MG capsule Commonly known as:  NEURONTIN Take 300 mg by mouth 2 (two) times daily.   glucose blood test strip Use as instructed   glucose monitoring kit monitoring kit 1 each by Does not apply route 4 (four) times daily - after meals and at bedtime. 1 month Diabetic Testing Supplies for QAC-QHS accuchecks.   insulin aspart 100 UNIT/ML FlexPen Commonly known as:  NOVOLOG 0-20 Units, Subcutaneous, 3 times daily with meals CBG < 70: implement hypoglycemia protocol CBG 70 - 120: 0 units CBG 121 - 150: 3 units CBG 151 - 200: 4 units CBG 201 - 250: 7 units CBG 251 - 300: 11 units CBG 301 - 350: 15 units CBG 351 - 400: 20 units CBG > 400: call MD   Insulin Glargine 100 UNIT/ML Solostar Pen Commonly known as:  LANTUS Inject 50 Units into the skin daily at 10 pm.   Insulin Pen Needle 32G X 8 MM Misc Use as directed   metFORMIN 1000 MG tablet Commonly known as:  GLUCOPHAGE TAKE 1 TABLET(1000 MG) BY MOUTH TWICE DAILY WITH A MEAL What changed:  See the new instructions.   naproxen sodium 220 MG tablet Commonly known as:   ALEVE Take 220 mg by mouth daily as needed (for pain or headache).   ondansetron 4 MG tablet Commonly known as:  ZOFRAN Take 1 tablet (4 mg total) by mouth every 8 (eight) hours as needed for nausea or vomiting.   Oxycodone HCl 10 MG Tabs Take 10 mg by mouth 4 (four) times daily as needed (for pain).   oxymetazoline 0.05 % nasal spray Commonly known as:  AFRIN Place 1 spray into both nostrils 2 (two) times daily.   polyethylene glycol packet Commonly known as:  MIRALAX / GLYCOLAX Take 17 g by mouth daily as needed for mild constipation.   Potassium Chloride ER 20 MEQ Tbcr Take 40 mEq by mouth daily.   spironolactone 25 MG tablet Commonly known as:  ALDACTONE Take 1 tablet (25 mg total) by mouth daily.       Allergies  Allergen Reactions  . Morphine And Related Nausea And Vomiting and Other (See Comments)    Severe nausea  . Penicillins Other (See Comments)    Pt states she has had a pain in her leg since a penicillin injection 2 months ago (reported 08/31/17).  Has tolerated amoxicillin oral.  Has patient had a PCN reaction causing immediate rash, facial/tongue/throat swelling, SOB or lightheadedness with hypotension: No Has patient had a PCN reaction causing severe rash involving mucus membranes or skin necrosis: No Has patient had a PCN reaction that required hospitalization: No Has patient had a PCN reaction occurring within the last 10 years: Yes If a   Consultations:  None  Procedures/Studies: Dg Tibia/fibula Right  Result Date: 10/22/2017 CLINICAL DATA:  Stepped on earring 4 days ago with redness and swelling extending up right lower leg. Diabetic. EXAM: RIGHT TIBIA AND FIBULA - 2 VIEW COMPARISON:  06/28/2017 FINDINGS: Soft tissues are within normal. No evidence of air within the soft tissues. Right total knee arthroplasty is present and intact. Small inferior calcaneal spur. IMPRESSION: No acute findings. Electronically Signed   By: Marin Olp M.D.   On:  10/22/2017 15:25   Ct Abdomen Pelvis W Contrast  Result Date: 10/15/2017 CLINICAL DATA:  Right low back pain. History  of chronic back pain, diabetes, arthritis, hip replacements. EXAM: CT ABDOMEN AND PELVIS WITH CONTRAST TECHNIQUE: Multidetector CT imaging of the abdomen and pelvis was performed using the standard protocol following bolus administration of intravenous contrast. CONTRAST:  161m OMNIPAQUE IOHEXOL 300 MG/ML  SOLN COMPARISON:  04/20/2016 FINDINGS: Lower chest: Hazy airspace opacities in the lung bases may indicate pulmonary edema. Cardiac enlargement. Hepatobiliary: No focal liver abnormality is seen. No gallstones, gallbladder wall thickening, or biliary dilatation. Pancreas: Unremarkable. No pancreatic ductal dilatation or surrounding inflammatory changes. Spleen: Normal in size without focal abnormality. Adrenals/Urinary Tract: Fullness of the adrenal glands without discrete nodularity. Renal nephrograms are symmetrical and homogeneous. No hydronephrosis or hydroureter. Visualization of the bladder is obscured by streak artifact due to hip arthroplasties. Stomach/Bowel: Stomach and small bowel are decompressed. Colon is stool filled without abnormal distention. No wall thickening or inflammatory changes appreciated. Appendix is not identified. Vascular/Lymphatic: Aortic atherosclerosis. No enlarged abdominal or pelvic lymph nodes. Reproductive: Uterus and bilateral adnexa are unremarkable. Other: No free air or free fluid in the abdomen. Small umbilical hernia containing fat. Musculoskeletal: Bilateral total hip arthroplasties. No destructive bone lesions. Mild degenerative changes in the spine. IMPRESSION: 1. Cardiac enlargement with hazy airspace opacities in the lung bases suggesting pulmonary edema. 2. No acute process demonstrated in the abdomen or pelvis. No evidence of bowel obstruction or inflammation. Aortic Atherosclerosis (ICD10-I70.0). Electronically Signed   By: WLucienne Capers M.D.   On: 10/15/2017 03:19   Dg Foot Complete Right  Result Date: 10/20/2017 CLINICAL DATA:  Fall.  Right foot pain. EXAM: RIGHT FOOT COMPLETE - 3+ VIEW COMPARISON:  None. FINDINGS: No fracture or dislocation. No suspicious focal osseous lesions. Small plantar right calcaneal spur. No radiopaque foreign bodies. Mild degenerative changes in the dorsal tarsal joints. IMPRESSION: No fracture or malalignment.  No radiopaque foreign body. Electronically Signed   By: JIlona SorrelM.D.   On: 10/20/2017 00:26   UKoreaRt Lower Extrem Ltd Soft Tissue Non Vascular  Result Date: 10/22/2017 CLINICAL DATA:  Right foot redness and swelling after an earring got stuck between the 1st and 2nd toes 4 days ago. Clinical concern for abscess. EXAM: ULTRASOUND RIGHT LOWER EXTREMITY LIMITED TECHNIQUE: Ultrasound examination of the lower extremity soft tissues was performed in the area of clinical concern. COMPARISON:  None. FINDINGS: Ultrasound of the area of clinical concern demonstrates diffuse subcutaneous edema which is somewhat more confluent in the deep soft tissues adjacent to the bone. No well-formed abscess was visualized. IMPRESSION: Diffuse subcutaneous edema without a well-formed abscess. If there is continued clinical concern for abscess, pre and postcontrast magnetic resonance imaging of the foot would be recommended. Electronically Signed   By: SClaudie ReveringM.D.   On: 10/22/2017 19:14   Subjective: Seen and examined at bedside and was doing better.  Foot was less swollen and not as erythematous.  No chest pain, lightheadedness or dizziness.  Says she slept okay.  No other concerns or complaints at this time and is ready to go home  Discharge Exam: Vitals:   10/25/17 0602 10/25/17 0845  BP: (!) 136/92 139/66  Pulse: 82 80  Resp: 16   Temp: 98.5 F (36.9 C)   SpO2: 94%    Vitals:   10/24/17 1540 10/25/17 0602 10/25/17 0636 10/25/17 0845  BP:  (!) 136/92  139/66  Pulse: 84 82  80  Resp: 15 16    Temp:   98.5 F (36.9 C)    TempSrc:  Oral  SpO2: 98% 94%    Weight:   97.2 kg   Height:       General: Pt is an obese AAF who is alert, awake, not in acute distress Cardiovascular: RRR, S1/S2 +, no rubs, no gallops Respiratory: Diminished bilaterally, no wheezing, no rhonchi Abdominal: Soft, NT, Distended due to body habitus, bowel sounds + Extremities: 1+ LE right foot pedal edema, no cyanosis; Right leg was erythematous but not as warm or swollen  The results of significant diagnostics from this hospitalization (including imaging, microbiology, ancillary and laboratory) are listed below for reference.    Microbiology: No results found for this or any previous visit (from the past 240 hour(s)).   Labs: BNP (last 3 results) Recent Labs    02/11/17 1345 03/15/17 2057 08/31/17 2013  BNP 195.6* 241.3* 672.0*   Basic Metabolic Panel: Recent Labs  Lab 10/22/17 1311 10/23/17 0509 10/24/17 0354 10/25/17 0405  NA 143 143 142 144  K 3.3* 3.2* 3.7 3.3*  CL 101 107 105 104  CO2 _0 GLUCOSE 329* 167* 186* 150*  BUN _1 CREATININE 1.02* 0.67 0.80 0.86  CALCIUM 9.8 8.8* 8.8* 8.7*  MG  --  1.7 1.7 1.4*  PHOS  --  3.2 2.9 3.3   Liver Function Tests: Recent Labs  Lab 10/24/17 0354 10/25/17 0405  AST 14* 14*  ALT 10 11  ALKPHOS 73 65  BILITOT 0.5 0.5  PROT 6.7 6.5  ALBUMIN 3.0* 2.9*   No results for input(s): LIPASE, AMYLASE in the last 168 hours. No results for input(s): AMMONIA in the last 168 hours. CBC: Recent Labs  Lab 10/22/17 1311 10/23/17 0509 10/24/17 0354 10/25/17 0405  WBC 23.6* 17.2* 12.8* 11.0*  NEUTROABS 20.2*  --  8.5* 6.6  HGB 12.2 10.6* 10.8* 10.3*  HCT 34.9* 30.5* 31.7* 30.4*  MCV 89.5 90.0 90.6 90.2  PLT 445* 376 415* 409*   Cardiac Enzymes: No results for input(s): CKTOTAL, CKMB, CKMBINDEX, TROPONINI in the last 168 hours. BNP: Invalid input(s): POCBNP CBG: Recent Labs  Lab 10/24/17 1300 10/24/17 1545 10/24/17 2106  10/24/17 2309 10/25/17 0732  GLUCAP 287* 301* 82 160* 123*   D-Dimer No results for input(s): DDIMER in the last 72 hours. Hgb A1c Recent Labs    10/22/17 1311  HGBA1C 10.9*   Lipid Profile No results for input(s): CHOL, HDL, LDLCALC, TRIG, CHOLHDL, LDLDIRECT in the last 72 hours. Thyroid function studies No results for input(s): TSH, T4TOTAL, T3FREE, THYROIDAB in the last 72 hours.  Invalid input(s): FREET3 Anemia work up No results for input(s): VITAMINB12, FOLATE, FERRITIN, TIBC, IRON, RETICCTPCT in the last 72 hours. Urinalysis    Component Value Date/Time   COLORURINE YELLOW 10/15/2017 0325   APPEARANCEUR CLOUDY (A) 10/15/2017 0325   LABSPEC 1.015 10/15/2017 0325   PHURINE 6.0 10/15/2017 0325   GLUCOSEU >=500 (A) 10/15/2017 0325   HGBUR NEGATIVE 10/15/2017 0325   BILIRUBINUR NEGATIVE 10/15/2017 0325   KETONESUR NEGATIVE 10/15/2017 0325   PROTEINUR NEGATIVE 10/15/2017 0325   UROBILINOGEN 0.2 10/04/2014 1450   NITRITE POSITIVE (A) 10/15/2017 0325   LEUKOCYTESUR NEGATIVE 10/15/2017 0325   Sepsis Labs Invalid input(s): PROCALCITONIN,  WBC,  LACTICIDVEN Microbiology No results found for this or any previous visit (from the past 240 hour(s)).  Time coordinating discharge: 35 minutes  SIGNED:  Kerney Elbe, DO [facility] 10/25/2017, 11:47 AM Pager is on [place]  If 7PM-7AM, please contact night-coverage [email or web address] Password [email or web address]

## 2017-10-28 ENCOUNTER — Other Ambulatory Visit: Payer: Self-pay | Admitting: Orthopaedic Surgery

## 2017-10-28 DIAGNOSIS — M5416 Radiculopathy, lumbar region: Secondary | ICD-10-CM

## 2017-11-11 ENCOUNTER — Inpatient Hospital Stay
Admission: RE | Admit: 2017-11-11 | Discharge: 2017-11-11 | Disposition: A | Discharge status: Home / Self Care | Payer: Medicare Other | Source: Ambulatory Visit | Attending: Orthopaedic Surgery | Admitting: Orthopaedic Surgery

## 2017-11-17 ENCOUNTER — Encounter: Payer: Self-pay | Admitting: Cardiology

## 2017-11-18 ENCOUNTER — Telehealth (HOSPITAL_COMMUNITY): Payer: Self-pay

## 2017-11-18 NOTE — Telephone Encounter (Signed)
Pt surgical clearance sent faxed

## 2017-11-27 ENCOUNTER — Inpatient Hospital Stay
Admission: RE | Admit: 2017-11-27 | Discharge: 2017-11-27 | Disposition: A | Discharge status: Home / Self Care | Payer: Medicare Other | Source: Ambulatory Visit | Attending: Orthopaedic Surgery | Admitting: Orthopaedic Surgery

## 2017-11-27 ENCOUNTER — Ambulatory Visit (INDEPENDENT_AMBULATORY_CARE_PROVIDER_SITE_OTHER): Payer: Medicare Other | Admitting: *Deleted

## 2017-11-27 DIAGNOSIS — I428 Other cardiomyopathies: Secondary | ICD-10-CM

## 2017-11-28 NOTE — Progress Notes (Signed)
Remote ICD transmission.   

## 2017-11-30 ENCOUNTER — Encounter: Payer: Self-pay | Admitting: Cardiology

## 2017-12-07 ENCOUNTER — Ambulatory Visit
Admission: RE | Admit: 2017-12-07 | Discharge: 2017-12-07 | Disposition: A | Discharge status: Home / Self Care | Payer: Medicare Other | Source: Ambulatory Visit | Attending: Orthopaedic Surgery | Admitting: Orthopaedic Surgery

## 2017-12-07 DIAGNOSIS — M5416 Radiculopathy, lumbar region: Secondary | ICD-10-CM

## 2017-12-07 NOTE — Discharge Instructions (Signed)

## 2017-12-08 MED ORDER — IOPAMIDOL (ISOVUE-M 200) INJECTION 41%
1.0000 mL | Freq: Once | INTRAMUSCULAR | Status: AC
  Administered 2017-12-07: 1 mL via EPIDURAL

## 2017-12-08 MED ORDER — METHYLPREDNISOLONE ACETATE 40 MG/ML INJ SUSP (RADIOLOG
120.0000 mg | Freq: Once | INTRAMUSCULAR | Status: AC
  Administered 2017-12-07: 120 mg via EPIDURAL

## 2017-12-14 ENCOUNTER — Telehealth (HOSPITAL_COMMUNITY): Payer: Self-pay | Admitting: Surgery

## 2017-12-14 ENCOUNTER — Ambulatory Visit (HOSPITAL_BASED_OUTPATIENT_CLINIC_OR_DEPARTMENT_OTHER)
Admission: RE | Admit: 2017-12-14 | Discharge: 2017-12-14 | Disposition: A | Discharge status: Home / Self Care | Payer: Medicare Other | Source: Ambulatory Visit | Attending: Cardiology | Admitting: Cardiology

## 2017-12-14 ENCOUNTER — Ambulatory Visit (HOSPITAL_COMMUNITY)
Admission: RE | Admit: 2017-12-14 | Discharge: 2017-12-14 | Disposition: A | Discharge status: Home / Self Care | Payer: Medicare Other | Source: Ambulatory Visit | Attending: Nurse Practitioner | Admitting: Nurse Practitioner

## 2017-12-14 ENCOUNTER — Encounter (HOSPITAL_COMMUNITY): Payer: Self-pay | Admitting: Cardiology

## 2017-12-14 VITALS — BP 140/75 | HR 80 | Wt 215.8 lb

## 2017-12-14 DIAGNOSIS — Z79899 Other long term (current) drug therapy: Secondary | ICD-10-CM | POA: Diagnosis not present

## 2017-12-14 DIAGNOSIS — I447 Left bundle-branch block, unspecified: Secondary | ICD-10-CM | POA: Diagnosis not present

## 2017-12-14 DIAGNOSIS — I454 Nonspecific intraventricular block: Secondary | ICD-10-CM | POA: Diagnosis not present

## 2017-12-14 DIAGNOSIS — Z87891 Personal history of nicotine dependence: Secondary | ICD-10-CM | POA: Insufficient documentation

## 2017-12-14 DIAGNOSIS — E1140 Type 2 diabetes mellitus with diabetic neuropathy, unspecified: Secondary | ICD-10-CM | POA: Insufficient documentation

## 2017-12-14 DIAGNOSIS — I5022 Chronic systolic (congestive) heart failure: Secondary | ICD-10-CM | POA: Diagnosis not present

## 2017-12-14 DIAGNOSIS — I428 Other cardiomyopathies: Secondary | ICD-10-CM | POA: Insufficient documentation

## 2017-12-14 DIAGNOSIS — I110 Hypertensive heart disease with heart failure: Secondary | ICD-10-CM | POA: Diagnosis not present

## 2017-12-14 DIAGNOSIS — Z794 Long term (current) use of insulin: Secondary | ICD-10-CM | POA: Diagnosis not present

## 2017-12-14 DIAGNOSIS — I2510 Atherosclerotic heart disease of native coronary artery without angina pectoris: Secondary | ICD-10-CM | POA: Diagnosis not present

## 2017-12-14 DIAGNOSIS — Z7901 Long term (current) use of anticoagulants: Secondary | ICD-10-CM | POA: Diagnosis not present

## 2017-12-14 DIAGNOSIS — I480 Paroxysmal atrial fibrillation: Secondary | ICD-10-CM | POA: Diagnosis not present

## 2017-12-14 DIAGNOSIS — Z9981 Dependence on supplemental oxygen: Secondary | ICD-10-CM | POA: Diagnosis not present

## 2017-12-14 DIAGNOSIS — E785 Hyperlipidemia, unspecified: Secondary | ICD-10-CM | POA: Insufficient documentation

## 2017-12-14 DIAGNOSIS — I10 Essential (primary) hypertension: Secondary | ICD-10-CM

## 2017-12-14 LAB — LIPID PANEL
Cholesterol: 140 mg/dL (ref 0–200)
HDL: 48 mg/dL (ref >40)
LDL Cholesterol: 85 mg/dL (ref 0–99)
Total CHOL/HDL Ratio: 2.9 RATIO
Triglycerides: 35 mg/dL (ref <150)
VLDL: 7 mg/dL (ref 0–40)

## 2017-12-14 LAB — BASIC METABOLIC PANEL
Anion gap: 10 (ref 5–15)
BUN: 16 mg/dL (ref 8–23)
CO2: 23 mmol/L (ref 22–32)
Calcium: 9.1 mg/dL (ref 8.9–10.3)
Chloride: 106 mmol/L (ref 98–111)
Creatinine, Ser: 0.88 mg/dL (ref 0.44–1.00)
GFR calc Af Amer: >60 mL/min (ref >60)
GFR calc non Af Amer: >60 mL/min (ref >60)
Glucose, Bld: 133 mg/dL — ABNORMAL HIGH (ref 70–99)
Potassium: 4 mmol/L (ref 3.5–5.1)
Sodium: 139 mmol/L (ref 135–145)

## 2017-12-14 LAB — CBC
HCT: 34.5 % — ABNORMAL LOW (ref 36.0–46.0)
Hemoglobin: 10.6 g/dL — ABNORMAL LOW (ref 12.0–15.0)
MCH: 29.9 pg (ref 26.0–34.0)
MCHC: 30.7 g/dL (ref 30.0–36.0)
MCV: 97.5 fL (ref 80.0–100.0)
Platelets: 381 10*3/uL (ref 150–400)
RBC: 3.54 MIL/uL — ABNORMAL LOW (ref 3.87–5.11)
RDW: 13.3 % (ref 11.5–15.5)
WBC: 9.4 10*3/uL (ref 4.0–10.5)
nRBC: 0 % (ref 0.0–0.2)

## 2017-12-14 MED ORDER — ISOSORB DINITRATE-HYDRALAZINE 20-37.5 MG PO TABS: 1.0000 | ORAL_TABLET | Freq: Three times a day (TID) | ORAL | 6 refills | Status: DC

## 2017-12-14 NOTE — Progress Notes (Signed)
Message sent to Carole Binning to arrange Paramedicine.

## 2017-12-14 NOTE — Progress Notes (Signed)
Echocardiogram 2D Echocardiogram has been performed.  [name] 12/14/2017, 9:51 AM

## 2017-12-14 NOTE — Patient Instructions (Signed)
Office will arrange for paramedicine to make home visits  Labs today We will only contact you if something comes back abnormal or we need to make some changes. Otherwise no news is good news!  Your physician recommends having a scan of your Heart. Office will contact you to arrange appointment.   Your physician recommends that you schedule a follow-up appointment in: 1 month with NP/PA clinic.

## 2017-12-14 NOTE — Telephone Encounter (Signed)
New referral sent for patient to be enrolled in the HF [employer].  I have sent all appropriate paperwork via secure email to Paramedic team.

## 2017-12-15 NOTE — Progress Notes (Signed)
Patient ID: [name], female   DOB: 04-Nov-1950, 67 y.o.   MRN: [id number]   Advanced Heart Failure Clinic Note   PCP: Dr. 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 female with history of nonischemic cardiomyopathy.  She was admitted with CHF exacerbation in 02/2012.  EF 20-25% on echo, LHC with nonobstructive CAD.  She was started on cardiac meds and discharged.  In 07/2012, she was admitted again with CHF exacerbation.  She had run out of Lasix.  She was taking her other heart medications as ordered, however.  She was diuresed and discharged. She had a chronic LBBB, and Medtronic CRT-D device was placed in 10/14.  She was admitted in 6/16 with hypertensive emergency and CHF exacerbation.   Also of note, she had PFTs in 9/16 showing restrictive spirometry with low lung volume and DLCO.  She was supposed to get a high resolution CT to evaluate for interstitial lung disease but never had the study.     Admitted March 2018 with urosepsis--> E Coli Bacteremia. Completed antibiotic course. EF was down from previous to 30-35%. Also had atrial fibrillation so she was loaded on amiodarone. Placed on eliquis. Discharge weight was 208 pounds.  She is now off amiodarone.   Admitted to [facility] 1/16 -> 02/13/16 with CP and dizziness. Found to be in Afib. Converted spontaneously overnight with BB and diuresis.   She returns for followup of CHF.  Weight is down 10 lbs.  Her main complaint is right knee pain and radiculopathy.  This makes it hard for her to walk.  She is not sure that she is taking her medications correctly. She denies exertional dyspnea but is not very active.  No orthopnea/PND.  No chest pain.  No palpitations, she is in NSR today.   Echo was done and reviewed today, EF 35-40%.    Optivol: No VT, rare atrial fibrillation.  Fluid index > threshold.    ECG (personally reviewed): NSR, IVCD with QRS 132 msec, PVC  Labs (2/14): SPEP negative, UPEP negative, HIV negative Labs  (04/14/2017): K 3.8 Creatinine 0.94  Labs (9/19): K 3.3, creatinine 0.86  PMH: 1. HTN 2. Type II diabetes with neuropathy 3. Nonischemic cardiomyopathy: ? Due to HTN versus LBBB CMP.  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  Echo (2/14) with EF 20-25%.  Echo (7/14) with EF 25%, diffuse hypokinesis.  HIV, SPEP, UPEP negative.  Has LBBB. CRT-D 10/2012 (Medtronic).  Echo (4/15) with EF 45-50%, mild diffuse hypokinesis, PA systolic pressure 38 mmHg.  Echo (6/16) with EF 40-45%, mild LVH, septal and inferior hypokinesis.   - Echo (3/18): EF 30-35%. Grade 1 DD - Echo (6/18): EF 45-50%, moderate LVH, normal RV size with mildly decreased systolic function.  - Echo (11/19): EF 35-40%, moderate LVH, moderate diastolic dysfunction, normal RV size with mildly decreased systolic function, PASP 43 mmHg.  4. Chronic LBBB 5. Right TKR 6. Bilateral THR.  7. Hyperlipidemia 8. ?COPD: Has oxygen for use with exertion.  - PFTs (9/16) with FEV1 77%, FVC 76%, ratio 101%, TLC 63%, DLCO 41% => moderate restrictive deficit  9. Atrial fibrillation: Paroxysmal.   SH: Prior smoker, quit 2/14.  Never drank ETOH.  No drugs. Lives with son.   FH: Mother with "heart trouble."   Review of systems complete and found to be negative unless listed in HPI.    Current Outpatient Medications  Medication Sig Dispense Refill  . acetaminophen (TYLENOL) 500 MG tablet Take 1 tablet (500  mg total) by mouth every 6 (six) hours as needed. 30 tablet 0  . albuterol (PROVENTIL HFA;VENTOLIN HFA) 108 (90 Base) MCG/ACT inhaler Inhale 2 puffs into the lungs every 6 (six) hours as needed for wheezing or shortness of breath. 18 g 0  . apixaban (ELIQUIS) 5 MG TABS tablet Take 1 tablet (5 mg total) by mouth 2 (two) times daily. 60 tablet 3  . atorvastatin (LIPITOR) 40 MG tablet Take 1 tablet (40 mg total) by mouth daily. 30 tablet 3  . carvedilol (COREG) 25 MG tablet Take 1 tablet (25 mg total) by mouth 2 (two) times daily with a meal. 60 tablet 11    . ENTRESTO 97-103 MG TAKE 1 TABLET BY MOUTH TWICE DAILY 60 tablet 6  . furosemide (LASIX) 40 MG tablet Take 1 tablet (40 mg total) by mouth 2 (two) times daily. 180 tablet 3  . gabapentin (NEURONTIN) 300 MG capsule Take 300 mg by mouth 2 (two) times daily.   0  . glucose blood (FREESTYLE TEST STRIPS) test strip Use as instructed 100 each 0  . glucose monitoring kit (FREESTYLE) monitoring kit 1 each by Does not apply route 4 (four) times daily - after meals and at bedtime. 1 month Diabetic Testing Supplies for QAC-QHS accuchecks. 1 each 1  . insulin aspart (NOVOLOG FLEXPEN) 100 UNIT/ML FlexPen 0-20 Units, Subcutaneous, 3 times daily with meals CBG < 70: implement hypoglycemia protocol CBG 70 - 120: 0 units CBG 121 - 150: 3 units CBG 151 - 200: 4 units CBG 201 - 250: 7 units CBG 251 - 300: 11 units CBG 301 - 350: 15 units CBG 351 - 400: 20 units CBG > 400: call MD 15 mL 0  . Insulin Glargine (LANTUS SOLOSTAR) 100 UNIT/ML Solostar Pen Inject 50 Units into the skin daily at 10 pm. 15 mL 0  . Insulin Pen Needle 32G X 8 MM MISC Use as directed 100 each 0  . isosorbide-hydrALAZINE (BIDIL) 20-37.5 MG tablet Take 1 tablet by mouth 3 (three) times daily. 90 tablet 6  . Lancets (FREESTYLE) lancets Use as instructed 100 each 0  . metFORMIN (GLUCOPHAGE) 1000 MG tablet TAKE 1 TABLET(1000 MG) BY MOUTH TWICE DAILY WITH A MEAL (Patient taking differently: Take 1,000 mg by mouth 2 (two) times daily. ) 60 tablet 0  . naproxen sodium (ALEVE) 220 MG tablet Take 220 mg by mouth daily as needed (for pain or headache).     . Oxycodone HCl 10 MG TABS Take 10 mg by mouth 4 (four) times daily as needed (for pain).    . polyethylene glycol (MIRALAX / GLYCOLAX) packet Take 17 g by mouth daily as needed for mild constipation. 14 each 0  . potassium chloride 20 MEQ TBCR Take 40 mEq by mouth daily. 60 tablet 3  . spironolactone (ALDACTONE) 25 MG tablet Take 1 tablet (25 mg total) by mouth daily. 30 tablet 3   No current  facility-administered medications for this encounter.    Vitals:   12/14/17 0955  BP: 140/75  Pulse: 80  SpO2: 96%  Weight: 97.9 kg (215 lb 12.8 oz)    Wt Readings from Last 3 Encounters:  12/14/17 97.9 kg (215 lb 12.8 oz)  10/25/17 97.2 kg (214 lb 4.6 oz)  10/19/17 97.1 kg (214 lb)    Physical exam General: NAD Neck: JVP 8 cm, no thyromegaly or thyroid nodule.  Lungs: Clear to auscultation bilaterally with normal respiratory effort. CV: Nondisplaced PMI.  Heart regular S1/S2, no S3/S4,  no murmur.  No peripheral edema.  No carotid bruit.  Normal pedal pulses.  Abdomen: Soft, nontender, no hepatosplenomegaly, no distention.  Skin: Intact without lesions or rashes.  Neurologic: Alert and oriented x 3.  Psych: Normal affect. Extremities: No clubbing or cyanosis.  HEENT: Normal.   Assessment/Plan: 1. Chronic systolic CHF: Nonischemic cardiomyopathy, thought to be related to HTN. S/P CRT-D (Medtronic). Echo was done today and reviewed, EF 35-40%, moderate LVH. NYHA II but not very active due to orthopedic issues. She looks mildly volume overloaded on exam and Optivol also suggests volume overload.  She is not sure how she is taking her medications, so not sure how she is taking her Lasix.  - Continue Coreg 25 mg BID.  - Continue Entresto 97/103 mg BID - Continue bidil 2 tabs TID - Continue spiro 25 mg daily.  - She should be taking Lasix 40 mg bid.  BMET today.  - I will arrange for her to be followed by paramedicine.  Need to go to her house and see if we can straighten up her medications.  - LVH on echo may be related to HTN, but I think it would be reasonable to workup her up for cardiac amyloidosis.  Myeloma evaluation was negative in the past.  I will send her for a PYP scan.  2. Hyperlipidemia: Continue statin, check lipids today.   3. HTN: BP mildly elevated but not sure that she is taking her medications correctly.  Will send paramedicine.  4. PAF: Rare atrial fibrillation on  device interrogation.   - Continue Eliquis for anticoagulation.   Followup with APP in 1 month.    [name], MD  12/15/2017

## 2017-12-22 ENCOUNTER — Other Ambulatory Visit (HOSPITAL_COMMUNITY): Payer: Self-pay | Admitting: Pharmacist

## 2017-12-22 ENCOUNTER — Other Ambulatory Visit (HOSPITAL_COMMUNITY): Payer: Self-pay

## 2017-12-22 MED ORDER — POTASSIUM CHLORIDE ER 20 MEQ PO TBCR: 40.0000 meq | EXTENDED_RELEASE_TABLET | Freq: Every day | ORAL | 5 refills | Status: DC

## 2017-12-22 NOTE — Progress Notes (Signed)
Paramedicine Encounter    Patient ID: [name], female    DOB: 11/04/1950, 67 y.o.   MRN: [id number]   Patient Care Team: Jeanella Anton, NP as PCP - General (Nurse Practitioner)  Patient Active Problem List   Diagnosis Date Noted  . Right foot infection 10/22/2017  . Cellulitis in diabetic foot ([place]) 10/22/2017  . Hyperglycemia 10/22/2017  . Lumbar radiculopathy 09/18/2017  . Degenerative spondylolisthesis 09/18/2017  . Atrial fibrillation ([place]) 02/11/2017  . Paroxysmal A-fib ([place])   . Biventricular automatic implantable cardioverter defibrillator in situ   . Sepsis ([place]) 04/20/2016  . UTI (urinary tract infection) 04/20/2016  . Dyspnea 07/19/2015  . Interstitial lung disease ([place]) 11/20/2014  . SIRS (systemic inflammatory response syndrome) ([place]) 02/03/2014  . IDDM (insulin dependent diabetes mellitus) ([place]) 02/03/2014  . Tachycardia 06/14/2013  . Chronic systolic CHF (congestive heart failure) ([place]) 09/29/2012  . Hypoxia 03/06/2012  . Hypertensive heart disease 03/06/2012  . Nonischemic cardiomyopathy ([place]) 03/06/2012  . Tobacco abuse 03/06/2012  . Type 2 diabetes mellitus ([place]) 03/06/2012  . Hypertension 03/06/2012  . CAD (coronary artery disease), native coronary artery 03/06/2012  . Hypokalemia 03/06/2012  . Acute bronchitis 02/01/2009  . Sleep apnea 02/01/2009  . Chest pain 02/01/2009    Current Outpatient Medications:  .  apixaban (ELIQUIS) 5 MG TABS tablet, Take 1 tablet (5 mg total) by mouth 2 (two) times daily., Disp: 60 tablet, Rfl: 3 .  carvedilol (COREG) 25 MG tablet, Take 1 tablet (25 mg total) by mouth 2 (two) times daily with a meal., Disp: 60 tablet, Rfl: 11 .  furosemide (LASIX) 40 MG tablet, Take 1 tablet (40 mg total) by mouth 2 (two) times daily., Disp: 180 tablet, Rfl: 3 .  Insulin Glargine (LANTUS SOLOSTAR) 100 UNIT/ML Solostar Pen, Inject 50 Units into the skin daily at 10 pm., Disp: 15 mL, Rfl: 0 .  isosorbide-hydrALAZINE (BIDIL) 20-37.5  MG tablet, Take 1 tablet by mouth 3 (three) times daily., Disp: 90 tablet, Rfl: 6 .  metFORMIN (GLUCOPHAGE) 1000 MG tablet, TAKE 1 TABLET(1000 MG) BY MOUTH TWICE DAILY WITH A MEAL (Patient taking differently: Take 1,000 mg by mouth 2 (two) times daily. ), Disp: 60 tablet, Rfl: 0 .  Oxycodone HCl 10 MG TABS, Take 10 mg by mouth 4 (four) times daily as needed (for pain)., Disp: , Rfl:  .  spironolactone (ALDACTONE) 25 MG tablet, Take 1 tablet (25 mg total) by mouth daily., Disp: 30 tablet, Rfl: 3 .  acetaminophen (TYLENOL) 500 MG tablet, Take 1 tablet (500 mg total) by mouth every 6 (six) hours as needed. (Patient not taking: Reported on 12/22/2017), Disp: 30 tablet, Rfl: 0 .  albuterol (PROVENTIL HFA;VENTOLIN HFA) 108 (90 Base) MCG/ACT inhaler, Inhale 2 puffs into the lungs every 6 (six) hours as needed for wheezing or shortness of breath. (Patient not taking: Reported on 12/22/2017), Disp: 18 g, Rfl: 0 .  atorvastatin (LIPITOR) 40 MG tablet, Take 1 tablet (40 mg total) by mouth daily. (Patient not taking: Reported on 12/22/2017), Disp: 30 tablet, Rfl: 3 .  ENTRESTO 97-103 MG, TAKE 1 TABLET BY MOUTH TWICE DAILY (Patient not taking: Reported on 12/22/2017), Disp: 60 tablet, Rfl: 6 .  gabapentin (NEURONTIN) 300 MG capsule, Take 300 mg by mouth 2 (two) times daily. , Disp: , Rfl: 0 .  glucose blood (FREESTYLE TEST STRIPS) test strip, Use as instructed, Disp: 100 each, Rfl: 0 .  glucose monitoring kit (FREESTYLE) monitoring kit, 1 each by Does not apply route 4 (  four) times daily - after meals and at bedtime. 1 month Diabetic Testing Supplies for QAC-QHS accuchecks., Disp: 1 each, Rfl: 1 .  insulin aspart (NOVOLOG FLEXPEN) 100 UNIT/ML FlexPen, 0-20 Units, Subcutaneous, 3 times daily with meals CBG < 70: implement hypoglycemia protocol CBG 70 - 120: 0 units CBG 121 - 150: 3 units CBG 151 - 200: 4 units CBG 201 - 250: 7 units CBG 251 - 300: 11 units CBG 301 - 350: 15 units CBG 351 - 400: 20 units CBG > 400: call  MD (Patient not taking: Reported on 12/22/2017), Disp: 15 mL, Rfl: 0 .  Insulin Pen Needle 32G X 8 MM MISC, Use as directed, Disp: 100 each, Rfl: 0 .  Lancets (FREESTYLE) lancets, Use as instructed, Disp: 100 each, Rfl: 0 .  naproxen sodium (ALEVE) 220 MG tablet, Take 220 mg by mouth daily as needed (for pain or headache). , Disp: , Rfl:  .  polyethylene glycol (MIRALAX / GLYCOLAX) packet, Take 17 g by mouth daily as needed for mild constipation. (Patient not taking: Reported on 12/22/2017), Disp: 14 each, Rfl: 0 .  potassium chloride 20 MEQ TBCR, Take 40 mEq by mouth daily. (Patient not taking: Reported on 12/22/2017), Disp: 60 tablet, Rfl: 3 Allergies  Allergen Reactions  . Morphine And Related Nausea Only    Severe nausea  . Penicillins Other (See Comments)    Pt states she has had a pain in her leg since a penicillin injection 2 months ago (reported 08/31/17).  Has tolerated amoxicillin oral.  Has patient had a PCN reaction causing immediate rash, facial/tongue/throat swelling, SOB or lightheadedness with hypotension: No Has patient had a PCN reaction causing severe rash involving mucus membranes or skin necrosis: No Has patient had a PCN reaction that required hospitalization: No Has patient had a PCN reaction occurring within the last 10 years: Yes If a      Social History   Socioeconomic History  . Marital status: Single    Spouse name: Not on file  . Number of children: Not on file  . Years of education: Not on file  . Highest education level: Not on file  Occupational History  . Not on file  Social Needs  . Financial resource strain: Not on file  . Food insecurity:    Worry: Not on file    Inability: Not on file  . Transportation needs:    Medical: Not on file    Non-medical: Not on file  Tobacco Use  . Smoking status: Former Smoker    Packs/day: 1.00    Years: 40.00    Pack years: 40.00    Types: Cigarettes    Start date: 06/23/1972    Last attempt to quit:  03/26/2012    Years since quitting: 5.7  . Smokeless tobacco: Never Used  Substance and Sexual Activity  . Alcohol use: No  . Drug use: No  . Sexual activity: Yes  Lifestyle  . Physical activity:    Days per week: Not on file    Minutes per session: Not on file  . Stress: Not on file  Relationships  . Social connections:    Talks on phone: Not on file    Gets together: Not on file    Attends religious service: Not on file    Active member of club or organization: Not on file    Attends meetings of clubs or organizations: Not on file    Relationship status: Not on file  . Intimate  partner violence:    Fear of current or ex partner: Not on file    Emotionally abused: Not on file    Physically abused: Not on file    Forced sexual activity: Not on file  Other Topics Concern  . Not on file  Social History Narrative  . Not on file    Physical Exam  Constitutional: She is oriented to person, place, and time.  Cardiovascular: Normal rate and regular rhythm.  Pulmonary/Chest: Effort normal and breath sounds normal.  Abdominal: Soft.  Musculoskeletal: Normal range of motion. She exhibits no edema.  Neurological: She is alert and oriented to person, place, and time.  Skin: Skin is warm and dry.  Psychiatric: She has a normal mood and affect.        Future Appointments  Date Time Provider [place]  01/13/2018 10:00 AM [facility] PA/NP [facility] None  03/01/2018  7:40 AM [facility] DEVICE REMOTES [facility]    BP (!) 160/90 (BP Location: Right Arm, Patient Position: Sitting, Cuff Size: Large)   Pulse 90   Resp 18   Wt 212 lb 6.4 oz (96.3 kg)   SpO2 91%   BMI 33.27 kg/m   Weight yesterday- did not weigh Last visit weight- n/a  [name] was seen at home today for the first time since being re-referred to community paramedicine. I picked up four medications from the pharmacy however she was still out of several medications including entresto, potassium  and atorvastatin. I called the pharmacy and had them refill the necessary prescriptions. Her medications were verified and her pillbox was refilled. I will follow up tomorrow to be sure she has gotten the medications from the pharmacy and return to fill her pillbox.   [name], EMT 12/22/17  ACTION: Home visit completed Next visit planned for Tomorrow

## 2018-01-01 ENCOUNTER — Other Ambulatory Visit (HOSPITAL_COMMUNITY): Payer: Self-pay

## 2018-01-01 NOTE — Progress Notes (Signed)
Paramedicine Encounter    Patient ID: [name], female    DOB: 1950/03/16, 67 y.o.   MRN: [id number]   Patient Care Team: Jeanella Anton, NP as PCP - General (Nurse Practitioner)  Patient Active Problem List   Diagnosis Date Noted  . Right foot infection 10/22/2017  . Cellulitis in diabetic foot ([place]) 10/22/2017  . Hyperglycemia 10/22/2017  . Lumbar radiculopathy 09/18/2017  . Degenerative spondylolisthesis 09/18/2017  . Atrial fibrillation ([place]) 02/11/2017  . Paroxysmal A-fib ([place])   . Biventricular automatic implantable cardioverter defibrillator in situ   . Sepsis ([place]) 04/20/2016  . UTI (urinary tract infection) 04/20/2016  . Dyspnea 07/19/2015  . Interstitial lung disease ([place]) 11/20/2014  . SIRS (systemic inflammatory response syndrome) ([place]) 02/03/2014  . IDDM (insulin dependent diabetes mellitus) ([place]) 02/03/2014  . Tachycardia 06/14/2013  . Chronic systolic CHF (congestive heart failure) ([place]) 09/29/2012  . Hypoxia 03/06/2012  . Hypertensive heart disease 03/06/2012  . Nonischemic cardiomyopathy ([place]) 03/06/2012  . Tobacco abuse 03/06/2012  . Type 2 diabetes mellitus ([place]) 03/06/2012  . Hypertension 03/06/2012  . CAD (coronary artery disease), native coronary artery 03/06/2012  . Hypokalemia 03/06/2012  . Acute bronchitis 02/01/2009  . Sleep apnea 02/01/2009  . Chest pain 02/01/2009    Current Outpatient Medications:  .  apixaban (ELIQUIS) 5 MG TABS tablet, Take 1 tablet (5 mg total) by mouth 2 (two) times daily., Disp: 60 tablet, Rfl: 3 .  atorvastatin (LIPITOR) 40 MG tablet, Take 1 tablet (40 mg total) by mouth daily., Disp: 30 tablet, Rfl: 3 .  carvedilol (COREG) 25 MG tablet, Take 1 tablet (25 mg total) by mouth 2 (two) times daily with a meal., Disp: 60 tablet, Rfl: 11 .  ENTRESTO 97-103 MG, TAKE 1 TABLET BY MOUTH TWICE DAILY, Disp: 60 tablet, Rfl: 6 .  furosemide (LASIX) 40 MG tablet, Take 1 tablet (40 mg total) by mouth 2 (two) times daily., Disp:  180 tablet, Rfl: 3 .  Insulin Glargine (LANTUS SOLOSTAR) 100 UNIT/ML Solostar Pen, Inject 50 Units into the skin daily at 10 pm., Disp: 15 mL, Rfl: 0 .  isosorbide-hydrALAZINE (BIDIL) 20-37.5 MG tablet, Take 1 tablet by mouth 3 (three) times daily., Disp: 90 tablet, Rfl: 6 .  metFORMIN (GLUCOPHAGE) 1000 MG tablet, TAKE 1 TABLET(1000 MG) BY MOUTH TWICE DAILY WITH A MEAL (Patient taking differently: Take 1,000 mg by mouth 2 (two) times daily. ), Disp: 60 tablet, Rfl: 0 .  Oxycodone HCl 10 MG TABS, Take 10 mg by mouth 4 (four) times daily as needed (for pain)., Disp: , Rfl:  .  Potassium Chloride ER 20 MEQ TBCR, Take 40 mEq by mouth daily., Disp: 60 tablet, Rfl: 5 .  spironolactone (ALDACTONE) 25 MG tablet, Take 1 tablet (25 mg total) by mouth daily., Disp: 30 tablet, Rfl: 3 .  acetaminophen (TYLENOL) 500 MG tablet, Take 1 tablet (500 mg total) by mouth every 6 (six) hours as needed. (Patient not taking: Reported on 12/22/2017), Disp: 30 tablet, Rfl: 0 .  albuterol (PROVENTIL HFA;VENTOLIN HFA) 108 (90 Base) MCG/ACT inhaler, Inhale 2 puffs into the lungs every 6 (six) hours as needed for wheezing or shortness of breath. (Patient not taking: Reported on 12/22/2017), Disp: 18 g, Rfl: 0 .  gabapentin (NEURONTIN) 300 MG capsule, Take 300 mg by mouth 2 (two) times daily. , Disp: , Rfl: 0 .  glucose blood (FREESTYLE TEST STRIPS) test strip, Use as instructed, Disp: 100 each, Rfl: 0 .  glucose monitoring kit (FREESTYLE) monitoring kit, 1  each by Does not apply route 4 (four) times daily - after meals and at bedtime. 1 month Diabetic Testing Supplies for QAC-QHS accuchecks., Disp: 1 each, Rfl: 1 .  insulin aspart (NOVOLOG FLEXPEN) 100 UNIT/ML FlexPen, 0-20 Units, Subcutaneous, 3 times daily with meals CBG < 70: implement hypoglycemia protocol CBG 70 - 120: 0 units CBG 121 - 150: 3 units CBG 151 - 200: 4 units CBG 201 - 250: 7 units CBG 251 - 300: 11 units CBG 301 - 350: 15 units CBG 351 - 400: 20 units CBG > 400:  call MD (Patient not taking: Reported on 12/22/2017), Disp: 15 mL, Rfl: 0 .  Insulin Pen Needle 32G X 8 MM MISC, Use as directed, Disp: 100 each, Rfl: 0 .  Lancets (FREESTYLE) lancets, Use as instructed, Disp: 100 each, Rfl: 0 .  naproxen sodium (ALEVE) 220 MG tablet, Take 220 mg by mouth daily as needed (for pain or headache). , Disp: , Rfl:  .  polyethylene glycol (MIRALAX / GLYCOLAX) packet, Take 17 g by mouth daily as needed for mild constipation. (Patient not taking: Reported on 12/22/2017), Disp: 14 each, Rfl: 0 Allergies  Allergen Reactions  . Morphine And Related Nausea Only    Severe nausea  . Penicillins Other (See Comments)    Pt states she has had a pain in her leg since a penicillin injection 2 months ago (reported 08/31/17).  Has tolerated amoxicillin oral.  Has patient had a PCN reaction causing immediate rash, facial/tongue/throat swelling, SOB or lightheadedness with hypotension: No Has patient had a PCN reaction causing severe rash involving mucus membranes or skin necrosis: No Has patient had a PCN reaction that required hospitalization: No Has patient had a PCN reaction occurring within the last 10 years: Yes If a      Social History   Socioeconomic History  . Marital status: Single    Spouse name: Not on file  . Number of children: Not on file  . Years of education: Not on file  . Highest education level: Not on file  Occupational History  . Not on file  Social Needs  . Financial resource strain: Not on file  . Food insecurity:    Worry: Not on file    Inability: Not on file  . Transportation needs:    Medical: Not on file    Non-medical: Not on file  Tobacco Use  . Smoking status: Former Smoker    Packs/day: 1.00    Years: 40.00    Pack years: 40.00    Types: Cigarettes    Start date: 06/23/1972    Last attempt to quit: 03/26/2012    Years since quitting: 5.7  . Smokeless tobacco: Never Used  Substance and Sexual Activity  . Alcohol use: No  . Drug  use: No  . Sexual activity: Yes  Lifestyle  . Physical activity:    Days per week: Not on file    Minutes per session: Not on file  . Stress: Not on file  Relationships  . Social connections:    Talks on phone: Not on file    Gets together: Not on file    Attends religious service: Not on file    Active member of club or organization: Not on file    Attends meetings of clubs or organizations: Not on file    Relationship status: Not on file  . Intimate partner violence:    Fear of current or ex partner: Not on file  Emotionally abused: Not on file    Physically abused: Not on file    Forced sexual activity: Not on file  Other Topics Concern  . Not on file  Social History Narrative  . Not on file    Physical Exam  Constitutional: She is oriented to person, place, and time.  Cardiovascular: Normal rate and regular rhythm.  Pulmonary/Chest: Effort normal and breath sounds normal.  Abdominal: Soft.  Musculoskeletal: Normal range of motion. She exhibits no edema.  Neurological: She is alert and oriented to person, place, and time.  Skin: Skin is warm and dry.  Psychiatric: She has a normal mood and affect.        Future Appointments  Date Time Provider [place]  01/04/2018 12:00 PM [facility] INJ 1 [facility]  01/04/2018  1:00 PM MC-NM 1 [facility]  01/13/2018 10:00 AM [facility] PA/NP [facility] None  03/01/2018  7:40 AM [facility] DEVICE REMOTES [facility]    BP 124/70 (BP Location: Left Arm, Patient Position: Sitting, Cuff Size: Large)   Pulse 86   Resp 16   Wt 207 lb 6.4 oz (94.1 kg)   SpO2 93%   BMI 32.48 kg/m   Weight yesterday- Did not weigh Last visit weight- 212.4 lb  [name] was seen at home today and reported feeling well. She denied chest pain, SOB, headache, dizziness or orthopnea. She reported being compliant with her medications and had tried to refill one of her pillboxes, though it appeared she had just sporadically taken the medications  over the past week. Her medications were verified and her pillbox was refilled. I will follow up in two weeks, as she has 2 pillboxes filled.   [name], EMT 01/01/18  ACTION: Home visit completed Next visit planned for 1 week

## 2018-01-04 ENCOUNTER — Encounter (HOSPITAL_COMMUNITY)
Admission: RE | Admit: 2018-01-04 | Discharge: 2018-01-04 | Disposition: A | Discharge status: Home / Self Care | Payer: Medicare Other | Source: Ambulatory Visit | Attending: Cardiology | Admitting: Cardiology

## 2018-01-04 ENCOUNTER — Ambulatory Visit (HOSPITAL_COMMUNITY)
Admission: RE | Admit: 2018-01-04 | Discharge: 2018-01-04 | Disposition: A | Discharge status: Home / Self Care | Payer: Medicare Other | Source: Ambulatory Visit | Attending: Cardiology | Admitting: Cardiology

## 2018-01-04 DIAGNOSIS — I5022 Chronic systolic (congestive) heart failure: Secondary | ICD-10-CM | POA: Diagnosis not present

## 2018-01-04 MED ORDER — TECHNETIUM TC 99M PYROPHOSPHATE
20.0000 | Freq: Once | INTRAVENOUS | Status: AC
  Administered 2018-01-04: 20 via INTRAVENOUS
  Filled 2018-01-04: qty 20

## 2018-01-12 NOTE — Progress Notes (Signed)
Patient ID: [name], female   DOB: 03-23-1950, 67 y.o.   MRN: [id number]   Advanced Heart Failure Clinic Note   PCP: Dr. 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 female with history of nonischemic cardiomyopathy.  She was admitted with CHF exacerbation in 02/2012.  EF 20-25% on echo, LHC with nonobstructive CAD.  She was started on cardiac meds and discharged.  In 07/2012, she was admitted again with CHF exacerbation.  She had run out of Lasix.  She was taking her other heart medications as ordered, however.  She was diuresed and discharged. She had a chronic LBBB, and Medtronic CRT-D device was placed in 10/14.  She was admitted in 6/16 with hypertensive emergency and CHF exacerbation.   Also of note, she had PFTs in 9/16 showing restrictive spirometry with low lung volume and DLCO.  She was supposed to get a high resolution CT to evaluate for interstitial lung disease but never had the study.     Admitted March 2018 with urosepsis--> E Coli Bacteremia. Completed antibiotic course. EF was down from previous to 30-35%. Also had atrial fibrillation so she was loaded on amiodarone. Placed on eliquis. Discharge weight was 208 pounds.  She is now off amiodarone.   Admitted to [facility] 1/16 -> 02/13/16 with CP and dizziness. Found to be in Afib. Converted spontaneously overnight with BB and diuresis.   She returns today for HF follow up. Last visit she was referred to HF paramedicine to help with medications. PYP strongly suggestive of TTR amyloid. Overall doing fine. She is able to walk around house and up stairs with no SOB. Now followed by paramedicine and has pill box. SBP 120s on last paramedicine check. Taking all meds, but has not taken anything yet today. Denies orthopnea, PND, or edema. No snoring. She feels a little dizzy at rest for 10-15 minutes after taking all am medications. No CP. No cough. Denies bleeding on Eliquis. Limits fluid and salt intake. Weighs daily, but writes  down and is not sure what she has gotten. Weight down 2 more lbs on our scale.   Echo 11/2017: EF 35-40%.   PYP 12/2017: Visual and quantitative assessment (grade 2, H/CLL equal 1.6) are strongly suggestive of transthyretin amyloidosis.  Optivol: Transmission did not come through.  ECG (personally reviewed): NSR, IVCD with QRS 132 msec, PVC  Labs (2/14): SPEP negative, UPEP negative, HIV negative Labs (04/14/2017): K 3.8 Creatinine 0.94  Labs (9/19): K 3.3, creatinine 0.86  PMH: 1. HTN 2. Type II diabetes with neuropathy 3. Nonischemic cardiomyopathy: ? Due to HTN versus LBBB CMP.  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  Echo (2/14) with EF 20-25%.  Echo (7/14) with EF 25%, diffuse hypokinesis.  HIV, SPEP, UPEP negative.  Has LBBB. CRT-D 10/2012 (Medtronic).  Echo (4/15) with EF 45-50%, mild diffuse hypokinesis, PA systolic pressure 38 mmHg.  Echo (6/16) with EF 40-45%, mild LVH, septal and inferior hypokinesis.   - Echo (3/18): EF 30-35%. Grade 1 DD - Echo (6/18): EF 45-50%, moderate LVH, normal RV size with mildly decreased systolic function.  - Echo (11/19): EF 35-40%, moderate LVH, moderate diastolic dysfunction, normal RV size with mildly decreased systolic function, PASP 43 mmHg.  4. Chronic LBBB 5. Right TKR 6. Bilateral THR.  7. Hyperlipidemia 8. ?COPD: Has oxygen for use with exertion.  - PFTs (9/16) with FEV1 77%, FVC 76%, ratio 101%, TLC 63%, DLCO 41% => moderate restrictive deficit  9. Atrial fibrillation: Paroxysmal.  SH: Prior smoker, quit 2/14.  Never drank ETOH.  No drugs. Lives with son.   FH: Mother with "heart trouble."   Review of systems complete and found to be negative unless listed in HPI.    Current Outpatient Medications  Medication Sig Dispense Refill  . apixaban (ELIQUIS) 5 MG TABS tablet Take 1 tablet (5 mg total) by mouth 2 (two) times daily. 60 tablet 3  . atorvastatin (LIPITOR) 40 MG tablet Take 1 tablet (40 mg total) by mouth daily. 30 tablet 3  .  carvedilol (COREG) 25 MG tablet Take 1 tablet (25 mg total) by mouth 2 (two) times daily with a meal. 60 tablet 11  . ENTRESTO 97-103 MG TAKE 1 TABLET BY MOUTH TWICE DAILY 60 tablet 6  . furosemide (LASIX) 40 MG tablet Take 1 tablet (40 mg total) by mouth 2 (two) times daily. 180 tablet 3  . isosorbide-hydrALAZINE (BIDIL) 20-37.5 MG tablet Take 1 tablet by mouth 3 (three) times daily. 90 tablet 6  . metFORMIN (GLUCOPHAGE) 1000 MG tablet TAKE 1 TABLET(1000 MG) BY MOUTH TWICE DAILY WITH A MEAL (Patient taking differently: Take 1,000 mg by mouth 2 (two) times daily. ) 60 tablet 0  . Potassium Chloride ER 20 MEQ TBCR Take 40 mEq by mouth daily. 60 tablet 5  . spironolactone (ALDACTONE) 25 MG tablet Take 1 tablet (25 mg total) by mouth daily. 30 tablet 3  . acetaminophen (TYLENOL) 500 MG tablet Take 1 tablet (500 mg total) by mouth every 6 (six) hours as needed. (Patient not taking: Reported on 12/22/2017) 30 tablet 0  . albuterol (PROVENTIL HFA;VENTOLIN HFA) 108 (90 Base) MCG/ACT inhaler Inhale 2 puffs into the lungs every 6 (six) hours as needed for wheezing or shortness of breath. (Patient not taking: Reported on 12/22/2017) 18 g 0  . gabapentin (NEURONTIN) 300 MG capsule Take 300 mg by mouth 2 (two) times daily.   0  . glucose blood (FREESTYLE TEST STRIPS) test strip Use as instructed 100 each 0  . glucose monitoring kit (FREESTYLE) monitoring kit 1 each by Does not apply route 4 (four) times daily - after meals and at bedtime. 1 month Diabetic Testing Supplies for QAC-QHS accuchecks. 1 each 1  . insulin aspart (NOVOLOG FLEXPEN) 100 UNIT/ML FlexPen 0-20 Units, Subcutaneous, 3 times daily with meals CBG < 70: implement hypoglycemia protocol CBG 70 - 120: 0 units CBG 121 - 150: 3 units CBG 151 - 200: 4 units CBG 201 - 250: 7 units CBG 251 - 300: 11 units CBG 301 - 350: 15 units CBG 351 - 400: 20 units CBG > 400: call MD (Patient not taking: Reported on 12/22/2017) 15 mL 0  . Insulin Glargine  (LANTUS SOLOSTAR) 100 UNIT/ML Solostar Pen Inject 50 Units into the skin daily at 10 pm. 15 mL 0  . Insulin Pen Needle 32G X 8 MM MISC Use as directed 100 each 0  . Lancets (FREESTYLE) lancets Use as instructed 100 each 0  . naproxen sodium (ALEVE) 220 MG tablet Take 220 mg by mouth daily as needed (for pain or headache).     . Oxycodone HCl 10 MG TABS Take 10 mg by mouth 4 (four) times daily as needed (for pain).    . polyethylene glycol (MIRALAX / GLYCOLAX) packet Take 17 g by mouth daily as needed for mild constipation. (Patient not taking: Reported on 12/22/2017) 14 each 0   No current facility-administered medications for this encounter.    Vitals:   01/13/18 [id number]  BP: 140/78  Pulse: 100  SpO2: 99%  Weight: 96.1 kg (211 lb 12.8 oz)    Wt Readings from Last 3 Encounters:  01/13/18 96.1 kg (211 lb 12.8 oz)  01/01/18 94.1 kg (207 lb 6.4 oz)  12/22/17 96.3 kg (212 lb 6.4 oz)    Physical exam General: Appears anxious. No resp difficulty. HEENT: Normal Neck: Supple. JVP flat. Carotids 2+ bilat; no bruits. No thyromegaly or nodule noted. Cor: PMI nondisplaced. IRR, No M/G/R noted Lungs: CTAB, normal effort. Abdomen: Soft, non-tender, non-distended, no HSM. No bruits or masses. +BS  Extremities: No cyanosis, clubbing, or rash. R and LLE no edema.  Neuro: Alert & orientedx3, cranial nerves grossly intact. moves all 4 extremities w/o difficulty. Affect pleasant  EKG: NSR with PACs, 92 bpm. Personally reviewed.   Assessment/Plan: 1. Chronic systolic CHF: Nonischemic cardiomyopathy, thought to be related to HTN. S/P CRT-D (Medtronic). Echo 11/2017 EF 35-40%, moderate LVH. NYHA II but not very active due to orthopedic issues. Volume stable on exam - PYP strongly suggestive of TTR amyloid. Discussed with patient. Send genetic testing today. Start approval for tafamidis 80 mg daily - Continue Coreg 25 mg BID.  - Continue Entresto 97/103 mg BID - Continue bidil 1 tab TID - Continue  spiro 25 mg qHS - Continue Lasix 40 mg bid.   - Continue paramedicine 2. Hyperlipidemia: Continue statin, LDL 85 11/2017 3. HTN: Elevated today, but has not taken medications. Last BP check on paramedicine check 124/70 4. PAF: Irregular on exam today. EKG shows NSR with PACs.  - Continue Eliquis for anticoagulation. Denies bleeding.   Genetic testing for TTR amyloid today. Start tafamidis approval Switch spiro to qHS to help with morning dizziness. BMET Follow up in 8 weeks with Dr Matilde Bash, NP  01/13/2018  Greater than 50% of the 25 minute visit was spent in counseling/coordination of care regarding disease state education, salt/fluid restriction, sliding scale diuretics, and medication compliance.

## 2018-01-13 ENCOUNTER — Ambulatory Visit (HOSPITAL_COMMUNITY)
Admission: RE | Admit: 2018-01-13 | Discharge: 2018-01-13 | Disposition: A | Discharge status: Home / Self Care | Payer: Medicare Other | Source: Ambulatory Visit | Attending: Cardiology | Admitting: Cardiology

## 2018-01-13 ENCOUNTER — Other Ambulatory Visit (HOSPITAL_COMMUNITY): Payer: Self-pay | Admitting: *Deleted

## 2018-01-13 ENCOUNTER — Encounter (HOSPITAL_COMMUNITY): Payer: Self-pay

## 2018-01-13 ENCOUNTER — Other Ambulatory Visit: Payer: Self-pay

## 2018-01-13 ENCOUNTER — Encounter: Payer: Self-pay | Admitting: Cardiology

## 2018-01-13 VITALS — BP 140/78 | HR 100 | Wt 211.8 lb

## 2018-01-13 DIAGNOSIS — E785 Hyperlipidemia, unspecified: Secondary | ICD-10-CM | POA: Insufficient documentation

## 2018-01-13 DIAGNOSIS — I10 Essential (primary) hypertension: Secondary | ICD-10-CM | POA: Diagnosis not present

## 2018-01-13 DIAGNOSIS — I428 Other cardiomyopathies: Secondary | ICD-10-CM | POA: Diagnosis not present

## 2018-01-13 DIAGNOSIS — Z7901 Long term (current) use of anticoagulants: Secondary | ICD-10-CM | POA: Insufficient documentation

## 2018-01-13 DIAGNOSIS — I2510 Atherosclerotic heart disease of native coronary artery without angina pectoris: Secondary | ICD-10-CM | POA: Diagnosis not present

## 2018-01-13 DIAGNOSIS — I110 Hypertensive heart disease with heart failure: Secondary | ICD-10-CM | POA: Insufficient documentation

## 2018-01-13 DIAGNOSIS — Z87891 Personal history of nicotine dependence: Secondary | ICD-10-CM | POA: Insufficient documentation

## 2018-01-13 DIAGNOSIS — I480 Paroxysmal atrial fibrillation: Secondary | ICD-10-CM

## 2018-01-13 DIAGNOSIS — I447 Left bundle-branch block, unspecified: Secondary | ICD-10-CM | POA: Diagnosis not present

## 2018-01-13 DIAGNOSIS — Z96653 Presence of artificial knee joint, bilateral: Secondary | ICD-10-CM | POA: Diagnosis not present

## 2018-01-13 DIAGNOSIS — I5022 Chronic systolic (congestive) heart failure: Secondary | ICD-10-CM | POA: Diagnosis not present

## 2018-01-13 DIAGNOSIS — I493 Ventricular premature depolarization: Secondary | ICD-10-CM | POA: Insufficient documentation

## 2018-01-13 DIAGNOSIS — Z794 Long term (current) use of insulin: Secondary | ICD-10-CM | POA: Diagnosis not present

## 2018-01-13 DIAGNOSIS — I43 Cardiomyopathy in diseases classified elsewhere: Secondary | ICD-10-CM

## 2018-01-13 DIAGNOSIS — Z79899 Other long term (current) drug therapy: Secondary | ICD-10-CM | POA: Insufficient documentation

## 2018-01-13 DIAGNOSIS — E854 Organ-limited amyloidosis: Secondary | ICD-10-CM

## 2018-01-13 DIAGNOSIS — E1140 Type 2 diabetes mellitus with diabetic neuropathy, unspecified: Secondary | ICD-10-CM | POA: Diagnosis not present

## 2018-01-13 DIAGNOSIS — Z96641 Presence of right artificial hip joint: Secondary | ICD-10-CM | POA: Diagnosis not present

## 2018-01-13 LAB — BASIC METABOLIC PANEL
Anion gap: 10 (ref 5–15)
BUN: 19 mg/dL (ref 8–23)
CO2: 27 mmol/L (ref 22–32)
Calcium: 9.2 mg/dL (ref 8.9–10.3)
Chloride: 103 mmol/L (ref 98–111)
Creatinine, Ser: 1 mg/dL (ref 0.44–1.00)
GFR calc Af Amer: >60 mL/min (ref >60)
GFR calc non Af Amer: 59 mL/min — ABNORMAL LOW (ref >60)
Glucose, Bld: 232 mg/dL — ABNORMAL HIGH (ref 70–99)
Potassium: 3.7 mmol/L (ref 3.5–5.1)
Sodium: 140 mmol/L (ref 135–145)

## 2018-01-13 MED ORDER — APIXABAN 5 MG PO TABS: 5.0000 mg | ORAL_TABLET | Freq: Two times a day (BID) | ORAL | 3 refills | Status: DC

## 2018-01-13 MED ORDER — SPIRONOLACTONE 25 MG PO TABS: 25.0000 mg | ORAL_TABLET | Freq: Every day | ORAL | 3 refills | Status: DC

## 2018-01-13 NOTE — Patient Instructions (Addendum)
Take Spironolactone at night instead of morning.   Genetic testing was done today. Office will notify you of results, results can take up to 2 weeks.  Your physician submitted prescription for Tafamidis to treat your heart condition.  You will be contacted in regards to starting medication.  Labs today We will only contact you if something comes back abnormal or we need to make some changes. Otherwise no news is good news!  Your physician recommends that you schedule a follow-up appointment in: 8 weeks with Dr. Aundra Dubin

## 2018-01-14 ENCOUNTER — Encounter (HOSPITAL_COMMUNITY): Payer: Self-pay

## 2018-01-14 ENCOUNTER — Telehealth (HOSPITAL_COMMUNITY): Payer: Self-pay

## 2018-01-14 NOTE — Addendum Note (Signed)
Encounter addended by: Jorge Ny, LCSW on: 01/14/2018 9:56 AM  Actions taken: Visit Navigator Flowsheet section accepted

## 2018-01-14 NOTE — Telephone Encounter (Signed)
I called [name] to schedule an appointment. She stated she would be available tomorrow afternoon so we agreed to meet around 14:00 or 15:00.

## 2018-01-15 ENCOUNTER — Other Ambulatory Visit: Payer: Self-pay | Admitting: Cardiology

## 2018-01-15 ENCOUNTER — Other Ambulatory Visit (HOSPITAL_COMMUNITY): Payer: Self-pay

## 2018-01-15 NOTE — Progress Notes (Signed)
Paramedicine Encounter    Patient ID: [name], female    DOB: 08-25-1950, 67 y.o.   MRN: [id number]   Patient Care Team: Jeanella Anton, NP as PCP - General (Nurse Practitioner) Jorge Ny, LCSW as Social Worker (Licensed Clinical Social Worker)  Patient Active Problem List   Diagnosis Date Noted  . Right foot infection 10/22/2017  . Cellulitis in diabetic foot ([place]) 10/22/2017  . Hyperglycemia 10/22/2017  . Lumbar radiculopathy 09/18/2017  . Degenerative spondylolisthesis 09/18/2017  . Atrial fibrillation ([place]) 02/11/2017  . Paroxysmal A-fib ([place])   . Biventricular automatic implantable cardioverter defibrillator in situ   . Sepsis ([place]) 04/20/2016  . UTI (urinary tract infection) 04/20/2016  . Dyspnea 07/19/2015  . Interstitial lung disease ([place]) 11/20/2014  . SIRS (systemic inflammatory response syndrome) ([place]) 02/03/2014  . IDDM (insulin dependent diabetes mellitus) ([place]) 02/03/2014  . Tachycardia 06/14/2013  . Chronic systolic CHF (congestive heart failure) ([place]) 09/29/2012  . Hypoxia 03/06/2012  . Hypertensive heart disease 03/06/2012  . Nonischemic cardiomyopathy ([place]) 03/06/2012  . Tobacco abuse 03/06/2012  . Type 2 diabetes mellitus ([place]) 03/06/2012  . Hypertension 03/06/2012  . CAD (coronary artery disease), native coronary artery 03/06/2012  . Hypokalemia 03/06/2012  . Acute bronchitis 02/01/2009  . Sleep apnea 02/01/2009  . Chest pain 02/01/2009    Current Outpatient Medications:  .  apixaban (ELIQUIS) 5 MG TABS tablet, Take 1 tablet (5 mg total) by mouth 2 (two) times daily., Disp: 60 tablet, Rfl: 3 .  atorvastatin (LIPITOR) 40 MG tablet, Take 1 tablet (40 mg total) by mouth daily., Disp: 30 tablet, Rfl: 3 .  carvedilol (COREG) 25 MG tablet, Take 1 tablet (25 mg total) by mouth 2 (two) times daily with a meal., Disp: 60 tablet, Rfl: 11 .  ENTRESTO 97-103 MG, TAKE 1 TABLET BY MOUTH TWICE DAILY, Disp: 60 tablet, Rfl: 6 .  furosemide (LASIX) 40 MG  tablet, Take 1 tablet (40 mg total) by mouth 2 (two) times daily., Disp: 180 tablet, Rfl: 3 .  Insulin Glargine (LANTUS SOLOSTAR) 100 UNIT/ML Solostar Pen, Inject 50 Units into the skin daily at 10 pm., Disp: 15 mL, Rfl: 0 .  isosorbide-hydrALAZINE (BIDIL) 20-37.5 MG tablet, Take 1 tablet by mouth 3 (three) times daily., Disp: 90 tablet, Rfl: 6 .  metFORMIN (GLUCOPHAGE) 1000 MG tablet, TAKE 1 TABLET(1000 MG) BY MOUTH TWICE DAILY WITH A MEAL (Patient taking differently: Take 1,000 mg by mouth 2 (two) times daily. ), Disp: 60 tablet, Rfl: 0 .  Oxycodone HCl 10 MG TABS, Take 10 mg by mouth 4 (four) times daily as needed (for pain)., Disp: , Rfl:  .  Potassium Chloride ER 20 MEQ TBCR, Take 40 mEq by mouth daily., Disp: 60 tablet, Rfl: 5 .  spironolactone (ALDACTONE) 25 MG tablet, Take 1 tablet (25 mg total) by mouth at bedtime., Disp: 30 tablet, Rfl: 3 .  acetaminophen (TYLENOL) 500 MG tablet, Take 1 tablet (500 mg total) by mouth every 6 (six) hours as needed. (Patient not taking: Reported on 12/22/2017), Disp: 30 tablet, Rfl: 0 .  albuterol (PROVENTIL HFA;VENTOLIN HFA) 108 (90 Base) MCG/ACT inhaler, Inhale 2 puffs into the lungs every 6 (six) hours as needed for wheezing or shortness of breath. (Patient not taking: Reported on 12/22/2017), Disp: 18 g, Rfl: 0 .  gabapentin (NEURONTIN) 300 MG capsule, Take 300 mg by mouth 2 (two) times daily. , Disp: , Rfl: 0 .  glucose blood (FREESTYLE TEST STRIPS) test strip, Use as instructed, Disp: 100  each, Rfl: 0 .  glucose monitoring kit (FREESTYLE) monitoring kit, 1 each by Does not apply route 4 (four) times daily - after meals and at bedtime. 1 month Diabetic Testing Supplies for QAC-QHS accuchecks., Disp: 1 each, Rfl: 1 .  insulin aspart (NOVOLOG FLEXPEN) 100 UNIT/ML FlexPen, 0-20 Units, Subcutaneous, 3 times daily with meals CBG < 70: implement hypoglycemia protocol CBG 70 - 120: 0 units CBG 121 - 150: 3 units CBG 151 - 200: 4 units CBG 201 - 250: 7 units CBG 251  - 300: 11 units CBG 301 - 350: 15 units CBG 351 - 400: 20 units CBG > 400: call MD (Patient not taking: Reported on 12/22/2017), Disp: 15 mL, Rfl: 0 .  Insulin Pen Needle 32G X 8 MM MISC, Use as directed, Disp: 100 each, Rfl: 0 .  Lancets (FREESTYLE) lancets, Use as instructed, Disp: 100 each, Rfl: 0 .  naproxen sodium (ALEVE) 220 MG tablet, Take 220 mg by mouth daily as needed (for pain or headache). , Disp: , Rfl:  .  polyethylene glycol (MIRALAX / GLYCOLAX) packet, Take 17 g by mouth daily as needed for mild constipation. (Patient not taking: Reported on 12/22/2017), Disp: 14 each, Rfl: 0 Allergies  Allergen Reactions  . Morphine And Related Nausea Only    Severe nausea  . Penicillins Other (See Comments)    Pt states she has had a pain in her leg since a penicillin injection 2 months ago (reported 08/31/17).  Has tolerated amoxicillin oral.  Has patient had a PCN reaction causing immediate rash, facial/tongue/throat swelling, SOB or lightheadedness with hypotension: No Has patient had a PCN reaction causing severe rash involving mucus membranes or skin necrosis: No Has patient had a PCN reaction that required hospitalization: No Has patient had a PCN reaction occurring within the last 10 years: Yes If a      Social History   Socioeconomic History  . Marital status: Single    Spouse name: Not on file  . Number of children: 4  . Years of education: 67  . Highest education level: Not on file  Occupational History  . Not on file  Social Needs  . Financial resource strain: Not hard at all  . Food insecurity:    Worry: Never true    Inability: Never true  . Transportation needs:    Medical: No    Non-medical: No  Tobacco Use  . Smoking status: Former Smoker    Packs/day: 1.00    Years: 40.00    Pack years: 40.00    Types: Cigarettes    Start date: 06/23/1972    Last attempt to quit: 03/26/2012    Years since quitting: 5.8  . Smokeless tobacco: Never Used  Substance and  Sexual Activity  . Alcohol use: No  . Drug use: No  . Sexual activity: Yes  Lifestyle  . Physical activity:    Days per week: Not on file    Minutes per session: Not on file  . Stress: Not on file  Relationships  . Social connections:    Talks on phone: Not on file    Gets together: Not on file    Attends religious service: Not on file    Active member of club or organization: Not on file    Attends meetings of clubs or organizations: Not on file    Relationship status: Not on file  . Intimate partner violence:    Fear of current or ex partner: Not on file  Emotionally abused: Not on file    Physically abused: Not on file    Forced sexual activity: Not on file  Other Topics Concern  . Not on file  Social History Narrative  . Not on file    Physical Exam Cardiovascular:     Rate and Rhythm: Normal rate and regular rhythm.  Pulmonary:     Effort: Pulmonary effort is normal.     Breath sounds: Normal breath sounds.  Musculoskeletal: Normal range of motion.     Right lower leg: No edema.     Left lower leg: No edema.  Skin:    General: Skin is warm and dry.     Capillary Refill: Capillary refill takes less than 2 seconds.  Neurological:     Mental Status: She is alert.  Psychiatric:        Mood and Affect: Mood normal.         Future Appointments  Date Time Provider [place]  03/01/2018  7:40 AM [facility]  03/25/2018  9:40 AM Larey Dresser, MD [facility] None    BP 134/74 (BP Location: Left Arm, Patient Position: Sitting, Cuff Size: Normal)   Pulse 80   Resp 16   Wt 206 lb (93.4 kg)   SpO2 96%   BMI 32.26 kg/m   Weight yesterday- 207 lb Last visit weight- 207.4 lb  Ms Spackman was see at home today and reported feeling generally well. She denied chest pain, SOB, headache, dizziness or orthopnea. She reported being compliant with her medications over the past week and her weight has been stable. Her  medications were verified and her pillbox was refilled.   [name], EMT 01/15/18  ACTION: Home visit completed Next visit planned for 1 week

## 2018-01-18 ENCOUNTER — Telehealth (HOSPITAL_COMMUNITY): Payer: Self-pay

## 2018-01-18 NOTE — Telephone Encounter (Signed)
PA submitted for Vyndamax 61mg  daily on 01/15/2018.  However, received faxed stating incomplete.  Forms completed and re-faxed.

## 2018-01-25 ENCOUNTER — Telehealth (HOSPITAL_COMMUNITY): Payer: Self-pay

## 2018-01-25 NOTE — Telephone Encounter (Signed)
Prior authorization through [place] was APPROVED for [employer] and will expire on 01/27/2019.  Pt aware that she has to call pharmacy to arrange shipment.  There is a zero dollar copay. Pt will call office when she receives medicine.

## 2018-01-27 LAB — CUP PACEART REMOTE DEVICE CHECK
Battery Remaining Longevity: 30 mo
Battery Voltage: 2.94 V
Brady Statistic AP VP Percent: 0.02 %
Brady Statistic AP VS Percent: 0.01 %
Brady Statistic AS VP Percent: 98.45 %
Brady Statistic AS VS Percent: 1.52 %
Brady Statistic RA Percent Paced: 0.03 %
Brady Statistic RV Percent Paced: 20.68 %
Date Time Interrogation Session: 20191101215826
HighPow Impedance: 54 Ohm
Implantable Lead Implant Date: 20141023
Implantable Lead Implant Date: 20141023
Implantable Lead Implant Date: 20141023
Implantable Lead Location: 753858
Implantable Lead Location: 753859
Implantable Lead Location: 753860
Implantable Lead Model: 4298
Implantable Lead Model: 5076
Implantable Lead Model: 6935
Implantable Pulse Generator Implant Date: 20141023
Lead Channel Impedance Value: 285 Ohm
Lead Channel Impedance Value: 304 Ohm
Lead Channel Impedance Value: 304 Ohm
Lead Channel Impedance Value: 342 Ohm
Lead Channel Impedance Value: 399 Ohm
Lead Channel Impedance Value: 399 Ohm
Lead Channel Impedance Value: 418 Ohm
Lead Channel Impedance Value: 475 Ohm
Lead Channel Impedance Value: 513 Ohm
Lead Channel Impedance Value: 513 Ohm
Lead Channel Impedance Value: 532 Ohm
Lead Channel Impedance Value: 551 Ohm
Lead Channel Impedance Value: 551 Ohm
Lead Channel Pacing Threshold Amplitude: 0.5 V
Lead Channel Pacing Threshold Amplitude: 0.625 V
Lead Channel Pacing Threshold Amplitude: 1.25 V
Lead Channel Pacing Threshold Pulse Width: 0.4 ms
Lead Channel Pacing Threshold Pulse Width: 0.4 ms
Lead Channel Pacing Threshold Pulse Width: 0.4 ms
Lead Channel Sensing Intrinsic Amplitude: 12.875 mV
Lead Channel Sensing Intrinsic Amplitude: 12.875 mV
Lead Channel Sensing Intrinsic Amplitude: 2.75 mV
Lead Channel Sensing Intrinsic Amplitude: 2.75 mV
Lead Channel Setting Pacing Amplitude: 1.75 V
Lead Channel Setting Pacing Amplitude: 2.5 V
Lead Channel Setting Pacing Amplitude: 2.5 V
Lead Channel Setting Pacing Pulse Width: 0.4 ms
Lead Channel Setting Pacing Pulse Width: 0.4 ms
Lead Channel Setting Sensing Sensitivity: 0.45 mV

## 2018-01-28 ENCOUNTER — Other Ambulatory Visit (HOSPITAL_COMMUNITY): Payer: Self-pay

## 2018-01-28 MED ORDER — TAFAMIDIS 61 MG PO CAPS: 61.0000 mg | ORAL_CAPSULE | Freq: Every day | ORAL | 11 refills | Status: DC

## 2018-01-28 NOTE — Telephone Encounter (Signed)
Pt called having trouble obtaining Vyndamax.  Called [facility], prescription called in and per rep, they will call patient to arrange shipment. Pt made aware of same.  States understanding of same.

## 2018-01-29 ENCOUNTER — Telehealth (HOSPITAL_COMMUNITY): Payer: Self-pay

## 2018-01-29 NOTE — Telephone Encounter (Signed)
I called [name] to schedule an appointment. She advised that she would be out paying bills today and asked if I could come next week. I advised I would call her early next week and she was agreeable.

## 2018-02-05 ENCOUNTER — Other Ambulatory Visit (HOSPITAL_COMMUNITY): Payer: Self-pay

## 2018-02-05 NOTE — Progress Notes (Signed)
Paramedicine Encounter    Patient ID: [name], female    DOB: Mar 07, 1950, 68 y.o.   MRN: [id number]   Patient Care Team: Jeanella Anton, NP as PCP - General (Nurse Practitioner) Jorge Ny, LCSW as Social Worker (Licensed Clinical Social Worker)  Patient Active Problem List   Diagnosis Date Noted  . Right foot infection 10/22/2017  . Cellulitis in diabetic foot ([place]) 10/22/2017  . Hyperglycemia 10/22/2017  . Lumbar radiculopathy 09/18/2017  . Degenerative spondylolisthesis 09/18/2017  . Atrial fibrillation ([place]) 02/11/2017  . Paroxysmal A-fib ([place])   . Biventricular automatic implantable cardioverter defibrillator in situ   . Sepsis ([place]) 04/20/2016  . UTI (urinary tract infection) 04/20/2016  . Dyspnea 07/19/2015  . Interstitial lung disease ([place]) 11/20/2014  . SIRS (systemic inflammatory response syndrome) ([place]) 02/03/2014  . IDDM (insulin dependent diabetes mellitus) ([place]) 02/03/2014  . Tachycardia 06/14/2013  . Chronic systolic CHF (congestive heart failure) ([place]) 09/29/2012  . Hypoxia 03/06/2012  . Hypertensive heart disease 03/06/2012  . Nonischemic cardiomyopathy ([place]) 03/06/2012  . Tobacco abuse 03/06/2012  . Type 2 diabetes mellitus ([place]) 03/06/2012  . Hypertension 03/06/2012  . CAD (coronary artery disease), native coronary artery 03/06/2012  . Hypokalemia 03/06/2012  . Acute bronchitis 02/01/2009  . Sleep apnea 02/01/2009  . Chest pain 02/01/2009    Current Outpatient Medications:  .  apixaban (ELIQUIS) 5 MG TABS tablet, Take 1 tablet (5 mg total) by mouth 2 (two) times daily., Disp: 60 tablet, Rfl: 3 .  atorvastatin (LIPITOR) 40 MG tablet, Take 1 tablet (40 mg total) by mouth daily., Disp: 30 tablet, Rfl: 3 .  carvedilol (COREG) 25 MG tablet, Take 1 tablet (25 mg total) by mouth 2 (two) times daily with a meal., Disp: 60 tablet, Rfl: 11 .  ENTRESTO 97-103 MG, TAKE 1 TABLET BY MOUTH TWICE DAILY, Disp: 60 tablet, Rfl: 6 .  furosemide (LASIX) 40 MG  tablet, Take 1 tablet (40 mg total) by mouth 2 (two) times daily., Disp: 180 tablet, Rfl: 3 .  Insulin Glargine (LANTUS SOLOSTAR) 100 UNIT/ML Solostar Pen, Inject 50 Units into the skin daily at 10 pm., Disp: 15 mL, Rfl: 0 .  isosorbide-hydrALAZINE (BIDIL) 20-37.5 MG tablet, Take 1 tablet by mouth 3 (three) times daily., Disp: 90 tablet, Rfl: 6 .  metFORMIN (GLUCOPHAGE) 1000 MG tablet, TAKE 1 TABLET(1000 MG) BY MOUTH TWICE DAILY WITH A MEAL (Patient taking differently: Take 1,000 mg by mouth 2 (two) times daily. ), Disp: 60 tablet, Rfl: 0 .  Oxycodone HCl 10 MG TABS, Take 10 mg by mouth 4 (four) times daily as needed (for pain)., Disp: , Rfl:  .  Potassium Chloride ER 20 MEQ TBCR, Take 40 mEq by mouth daily., Disp: 60 tablet, Rfl: 5 .  spironolactone (ALDACTONE) 25 MG tablet, Take 1 tablet (25 mg total) by mouth at bedtime., Disp: 30 tablet, Rfl: 3 .  Tafamidis (VYNDAMAX) 61 MG CAPS, Take 61 mg by mouth daily., Disp: 30 capsule, Rfl: 11 .  acetaminophen (TYLENOL) 500 MG tablet, Take 1 tablet (500 mg total) by mouth every 6 (six) hours as needed. (Patient not taking: Reported on 12/22/2017), Disp: 30 tablet, Rfl: 0 .  albuterol (PROVENTIL HFA;VENTOLIN HFA) 108 (90 Base) MCG/ACT inhaler, Inhale 2 puffs into the lungs every 6 (six) hours as needed for wheezing or shortness of breath. (Patient not taking: Reported on 12/22/2017), Disp: 18 g, Rfl: 0 .  gabapentin (NEURONTIN) 300 MG capsule, Take 300 mg by mouth 2 (two) times daily. ,  Disp: , Rfl: 0 .  glucose blood (FREESTYLE TEST STRIPS) test strip, Use as instructed, Disp: 100 each, Rfl: 0 .  glucose monitoring kit (FREESTYLE) monitoring kit, 1 each by Does not apply route 4 (four) times daily - after meals and at bedtime. 1 month Diabetic Testing Supplies for QAC-QHS accuchecks., Disp: 1 each, Rfl: 1 .  insulin aspart (NOVOLOG FLEXPEN) 100 UNIT/ML FlexPen, 0-20 Units, Subcutaneous, 3 times daily with meals CBG < 70: implement hypoglycemia protocol CBG 70  - 120: 0 units CBG 121 - 150: 3 units CBG 151 - 200: 4 units CBG 201 - 250: 7 units CBG 251 - 300: 11 units CBG 301 - 350: 15 units CBG 351 - 400: 20 units CBG > 400: call MD (Patient not taking: Reported on 12/22/2017), Disp: 15 mL, Rfl: 0 .  Insulin Pen Needle 32G X 8 MM MISC, Use as directed, Disp: 100 each, Rfl: 0 .  Lancets (FREESTYLE) lancets, Use as instructed, Disp: 100 each, Rfl: 0 .  naproxen sodium (ALEVE) 220 MG tablet, Take 220 mg by mouth daily as needed (for pain or headache). , Disp: , Rfl:  .  polyethylene glycol (MIRALAX / GLYCOLAX) packet, Take 17 g by mouth daily as needed for mild constipation. (Patient not taking: Reported on 12/22/2017), Disp: 14 each, Rfl: 0 Allergies  Allergen Reactions  . Morphine And Related Nausea Only    Severe nausea  . Penicillins Other (See Comments)    Pt states she has had a pain in her leg since a penicillin injection 2 months ago (reported 08/31/17).  Has tolerated amoxicillin oral.  Has patient had a PCN reaction causing immediate rash, facial/tongue/throat swelling, SOB or lightheadedness with hypotension: No Has patient had a PCN reaction causing severe rash involving mucus membranes or skin necrosis: No Has patient had a PCN reaction that required hospitalization: No Has patient had a PCN reaction occurring within the last 10 years: Yes If a      Social History   Socioeconomic History  . Marital status: Single    Spouse name: Not on file  . Number of children: 4  . Years of education: 24  . Highest education level: Not on file  Occupational History  . Not on file  Social Needs  . Financial resource strain: Not hard at all  . Food insecurity:    Worry: Never true    Inability: Never true  . Transportation needs:    Medical: No    Non-medical: No  Tobacco Use  . Smoking status: Former Smoker    Packs/day: 1.00    Years: 40.00    Pack years: 40.00    Types: Cigarettes    Start date: 06/23/1972    Last attempt to quit:  03/26/2012    Years since quitting: 5.8  . Smokeless tobacco: Never Used  Substance and Sexual Activity  . Alcohol use: No  . Drug use: No  . Sexual activity: Yes  Lifestyle  . Physical activity:    Days per week: Not on file    Minutes per session: Not on file  . Stress: Not on file  Relationships  . Social connections:    Talks on phone: Not on file    Gets together: Not on file    Attends religious service: Not on file    Active member of club or organization: Not on file    Attends meetings of clubs or organizations: Not on file    Relationship status: Not on  file  . Intimate partner violence:    Fear of current or ex partner: Not on file    Emotionally abused: Not on file    Physically abused: Not on file    Forced sexual activity: Not on file  Other Topics Concern  . Not on file  Social History Narrative  . Not on file    Physical Exam Cardiovascular:     Rate and Rhythm: Normal rate and regular rhythm.     Pulses: Normal pulses.  Pulmonary:     Effort: Pulmonary effort is normal.     Breath sounds: Normal breath sounds.  Musculoskeletal: Normal range of motion.     Right lower leg: No edema.     Left lower leg: No edema.  Skin:    General: Skin is warm and dry.     Capillary Refill: Capillary refill takes less than 2 seconds.  Neurological:     Mental Status: She is alert.  Psychiatric:        Mood and Affect: Mood normal.         Future Appointments  Date Time Provider [place]  03/01/2018  7:40 AM [facility]  03/25/2018  9:40 AM Larey Dresser, MD [facility] None    BP 120/60 (BP Location: Right Arm, Patient Position: Sitting, Cuff Size: Large)   Pulse 83   Resp 18   Wt 213 lb 6.4 oz (96.8 kg)   SpO2 94%   BMI 33.42 kg/m   Weight yesterday- Did not weigh Last visit weight- 206 lb  Ms Volkov was seen at home today and reported feeling generally well. She denied chest papin, SOB, headache,  dizziness or orthopnea. She stated she has been compliant with her medications since our last visit but her weight has gone up due to holiday eating. She did not exhibit any signs of fluid retention, thus I did not reach out for diuresis orders. Her medications were verified and her pillbox was refilled.   [name], EMT 02/05/18  ACTION: Home visit completed Next visit planned for 2 weeks

## 2018-02-10 ENCOUNTER — Other Ambulatory Visit (HOSPITAL_COMMUNITY): Payer: Self-pay | Admitting: Internal Medicine

## 2018-02-19 ENCOUNTER — Telehealth (HOSPITAL_COMMUNITY): Payer: Self-pay

## 2018-02-19 ENCOUNTER — Other Ambulatory Visit (HOSPITAL_COMMUNITY): Payer: Self-pay

## 2018-02-19 NOTE — Progress Notes (Signed)
Paramedicine Encounter    Patient ID: [name], female    DOB: December 12, 1950, 68 y.o.   MRN: [id number]   Patient Care Team: Jeanella Anton, NP as PCP - General (Nurse Practitioner) Jorge Ny, LCSW as Social Worker (Licensed Clinical Social Worker)  Patient Active Problem List   Diagnosis Date Noted  . Right foot infection 10/22/2017  . Cellulitis in diabetic foot ([place]) 10/22/2017  . Hyperglycemia 10/22/2017  . Lumbar radiculopathy 09/18/2017  . Degenerative spondylolisthesis 09/18/2017  . Atrial fibrillation ([place]) 02/11/2017  . Paroxysmal A-fib ([place])   . Biventricular automatic implantable cardioverter defibrillator in situ   . Sepsis ([place]) 04/20/2016  . UTI (urinary tract infection) 04/20/2016  . Dyspnea 07/19/2015  . Interstitial lung disease ([place]) 11/20/2014  . SIRS (systemic inflammatory response syndrome) ([place]) 02/03/2014  . IDDM (insulin dependent diabetes mellitus) ([place]) 02/03/2014  . Tachycardia 06/14/2013  . Chronic systolic CHF (congestive heart failure) ([place]) 09/29/2012  . Hypoxia 03/06/2012  . Hypertensive heart disease 03/06/2012  . Nonischemic cardiomyopathy ([place]) 03/06/2012  . Tobacco abuse 03/06/2012  . Type 2 diabetes mellitus ([place]) 03/06/2012  . Hypertension 03/06/2012  . CAD (coronary artery disease), native coronary artery 03/06/2012  . Hypokalemia 03/06/2012  . Acute bronchitis 02/01/2009  . Sleep apnea 02/01/2009  . Chest pain 02/01/2009    Current Outpatient Medications:  .  apixaban (ELIQUIS) 5 MG TABS tablet, Take 1 tablet (5 mg total) by mouth 2 (two) times daily., Disp: 60 tablet, Rfl: 3 .  atorvastatin (LIPITOR) 40 MG tablet, Take 1 tablet (40 mg total) by mouth daily., Disp: 30 tablet, Rfl: 3 .  carvedilol (COREG) 25 MG tablet, Take 1 tablet (25 mg total) by mouth 2 (two) times daily with a meal., Disp: 60 tablet, Rfl: 11 .  ENTRESTO 97-103 MG, TAKE 1 TABLET BY MOUTH TWICE DAILY, Disp: 60 tablet, Rfl: 6 .  Insulin Glargine (LANTUS  SOLOSTAR) 100 UNIT/ML Solostar Pen, Inject 50 Units into the skin daily at 10 pm., Disp: 15 mL, Rfl: 0 .  isosorbide-hydrALAZINE (BIDIL) 20-37.5 MG tablet, Take 1 tablet by mouth 3 (three) times daily., Disp: 90 tablet, Rfl: 6 .  metFORMIN (GLUCOPHAGE) 1000 MG tablet, TAKE 1 TABLET(1000 MG) BY MOUTH TWICE DAILY WITH A MEAL (Patient taking differently: Take 1,000 mg by mouth 2 (two) times daily. ), Disp: 60 tablet, Rfl: 0 .  Oxycodone HCl 10 MG TABS, Take 10 mg by mouth 4 (four) times daily as needed (for pain)., Disp: , Rfl:  .  Potassium Chloride ER 20 MEQ TBCR, Take 40 mEq by mouth daily., Disp: 60 tablet, Rfl: 5 .  spironolactone (ALDACTONE) 25 MG tablet, TAKE 1 TABLET(25 MG) BY MOUTH DAILY, Disp: 30 tablet, Rfl: 3 .  Tafamidis (VYNDAMAX) 61 MG CAPS, Take 61 mg by mouth daily., Disp: 30 capsule, Rfl: 11 .  acetaminophen (TYLENOL) 500 MG tablet, Take 1 tablet (500 mg total) by mouth every 6 (six) hours as needed. (Patient not taking: Reported on 12/22/2017), Disp: 30 tablet, Rfl: 0 .  albuterol (PROVENTIL HFA;VENTOLIN HFA) 108 (90 Base) MCG/ACT inhaler, Inhale 2 puffs into the lungs every 6 (six) hours as needed for wheezing or shortness of breath. (Patient not taking: Reported on 12/22/2017), Disp: 18 g, Rfl: 0 .  furosemide (LASIX) 40 MG tablet, Take 1 tablet (40 mg total) by mouth 2 (two) times daily., Disp: 180 tablet, Rfl: 3 .  gabapentin (NEURONTIN) 300 MG capsule, Take 300 mg by mouth 2 (two) times daily. , Disp: , Rfl:  0 .  glucose blood (FREESTYLE TEST STRIPS) test strip, Use as instructed, Disp: 100 each, Rfl: 0 .  glucose monitoring kit (FREESTYLE) monitoring kit, 1 each by Does not apply route 4 (four) times daily - after meals and at bedtime. 1 month Diabetic Testing Supplies for QAC-QHS accuchecks., Disp: 1 each, Rfl: 1 .  insulin aspart (NOVOLOG FLEXPEN) 100 UNIT/ML FlexPen, 0-20 Units, Subcutaneous, 3 times daily with meals CBG < 70: implement hypoglycemia protocol CBG 70 - 120: 0  units CBG 121 - 150: 3 units CBG 151 - 200: 4 units CBG 201 - 250: 7 units CBG 251 - 300: 11 units CBG 301 - 350: 15 units CBG 351 - 400: 20 units CBG > 400: call MD (Patient not taking: Reported on 12/22/2017), Disp: 15 mL, Rfl: 0 .  Insulin Pen Needle 32G X 8 MM MISC, Use as directed, Disp: 100 each, Rfl: 0 .  Lancets (FREESTYLE) lancets, Use as instructed, Disp: 100 each, Rfl: 0 .  naproxen sodium (ALEVE) 220 MG tablet, Take 220 mg by mouth daily as needed (for pain or headache). , Disp: , Rfl:  .  polyethylene glycol (MIRALAX / GLYCOLAX) packet, Take 17 g by mouth daily as needed for mild constipation. (Patient not taking: Reported on 12/22/2017), Disp: 14 each, Rfl: 0 Allergies  Allergen Reactions  . Morphine And Related Nausea Only    Severe nausea  . Penicillins Other (See Comments)    Pt states she has had a pain in her leg since a penicillin injection 2 months ago (reported 08/31/17).  Has tolerated amoxicillin oral.  Has patient had a PCN reaction causing immediate rash, facial/tongue/throat swelling, SOB or lightheadedness with hypotension: No Has patient had a PCN reaction causing severe rash involving mucus membranes or skin necrosis: No Has patient had a PCN reaction that required hospitalization: No Has patient had a PCN reaction occurring within the last 10 years: Yes If a      Social History   Socioeconomic History  . Marital status: Single    Spouse name: Not on file  . Number of children: 4  . Years of education: 11  . Highest education level: Not on file  Occupational History  . Not on file  Social Needs  . Financial resource strain: Not hard at all  . Food insecurity:    Worry: Never true    Inability: Never true  . Transportation needs:    Medical: No    Non-medical: No  Tobacco Use  . Smoking status: Former Smoker    Packs/day: 1.00    Years: 40.00    Pack years: 40.00    Types: Cigarettes    Start date: 06/23/1972    Last attempt to quit: 03/26/2012     Years since quitting: 5.9  . Smokeless tobacco: Never Used  Substance and Sexual Activity  . Alcohol use: No  . Drug use: No  . Sexual activity: Yes  Lifestyle  . Physical activity:    Days per week: Not on file    Minutes per session: Not on file  . Stress: Not on file  Relationships  . Social connections:    Talks on phone: Not on file    Gets together: Not on file    Attends religious service: Not on file    Active member of club or organization: Not on file    Attends meetings of clubs or organizations: Not on file    Relationship status: Not on file  .   Intimate partner violence:    Fear of current or ex partner: Not on file    Emotionally abused: Not on file    Physically abused: Not on file    Forced sexual activity: Not on file  Other Topics Concern  . Not on file  Social History Narrative  . Not on file    Physical Exam Cardiovascular:     Rate and Rhythm: Normal rate and regular rhythm.  Pulmonary:     Effort: Pulmonary effort is normal.     Breath sounds: Normal breath sounds.  Musculoskeletal: Normal range of motion.     Right lower leg: No edema.     Left lower leg: No edema.  Skin:    General: Skin is warm and dry.     Capillary Refill: Capillary refill takes less than 2 seconds.  Neurological:     Mental Status: She is alert and oriented to person, place, and time.  Psychiatric:        Mood and Affect: Mood normal.         Future Appointments  Date Time Provider [place]  03/01/2018  7:40 AM [facility] DEVICE REMOTES [facility]  03/25/2018  9:40 AM Larey Dresser, MD [facility] None    BP 122/70 (BP Location: Left Arm, Patient Position: Sitting, Cuff Size: Large)   Pulse 87   Resp 16   Wt 224 lb (101.6 kg)   SpO2 98%   BMI 35.08 kg/m   Weight yesterday- Did not weigh Last visit weight- 213 lb  Ms Sarinana was seen at home today and reported feeling well. She denied chest pain, SOB, headache, dizziness or orthopnea.  She stated she has been compliant with her medications however she still has bottles from November that were supposed to be a 30 day supple. As she was getting low on some medications, all necessary refills were ordered. She is currently out of furosemide so I will return early next week to place it in her pillbox. She has not been weighing because of chronic lag pain but I stressed the importance of daily weights and she stated she would "do better." Her medications were verified and her pillbox was refilled.   [name], EMT 02/19/18  ACTION: Home visit completed Next visit planned for 1 week

## 2018-02-19 NOTE — Telephone Encounter (Signed)
I called [name] to schedule an appointment. She did not answer so I left a voicemail requesting that she call me back.

## 2018-02-23 ENCOUNTER — Emergency Department (HOSPITAL_COMMUNITY)
Admission: EM | Admit: 2018-02-23 | Discharge: 2018-02-23 | Discharge status: Against Medical Advice | Payer: Medicare Other

## 2018-02-24 ENCOUNTER — Emergency Department (HOSPITAL_COMMUNITY)
Admission: EM | Admit: 2018-02-24 | Discharge: 2018-02-24 | Disposition: A | Discharge status: Home / Self Care | Payer: Medicare Other | Attending: Emergency Medicine | Admitting: Emergency Medicine

## 2018-02-24 DIAGNOSIS — R109 Unspecified abdominal pain: Secondary | ICD-10-CM | POA: Diagnosis present

## 2018-02-24 DIAGNOSIS — Z79899 Other long term (current) drug therapy: Secondary | ICD-10-CM | POA: Insufficient documentation

## 2018-02-24 DIAGNOSIS — N390 Urinary tract infection, site not specified: Secondary | ICD-10-CM | POA: Diagnosis not present

## 2018-02-24 DIAGNOSIS — I5042 Chronic combined systolic (congestive) and diastolic (congestive) heart failure: Secondary | ICD-10-CM | POA: Diagnosis not present

## 2018-02-24 DIAGNOSIS — I110 Hypertensive heart disease with heart failure: Secondary | ICD-10-CM | POA: Diagnosis not present

## 2018-02-24 DIAGNOSIS — B029 Zoster without complications: Secondary | ICD-10-CM

## 2018-02-24 DIAGNOSIS — E119 Type 2 diabetes mellitus without complications: Secondary | ICD-10-CM | POA: Diagnosis not present

## 2018-02-24 DIAGNOSIS — Z87891 Personal history of nicotine dependence: Secondary | ICD-10-CM | POA: Insufficient documentation

## 2018-02-24 LAB — COMPREHENSIVE METABOLIC PANEL
ALT: 10 U/L (ref 0–44)
AST: 14 U/L — ABNORMAL LOW (ref 15–41)
Albumin: 3.7 g/dL (ref 3.5–5.0)
Alkaline Phosphatase: 60 U/L (ref 38–126)
Anion gap: 11 (ref 5–15)
BUN: 11 mg/dL (ref 8–23)
CO2: 24 mmol/L (ref 22–32)
Calcium: 9.3 mg/dL (ref 8.9–10.3)
Chloride: 103 mmol/L (ref 98–111)
Creatinine, Ser: 0.87 mg/dL (ref 0.44–1.00)
GFR calc Af Amer: >60 mL/min (ref >60)
GFR calc non Af Amer: >60 mL/min (ref >60)
Glucose, Bld: 294 mg/dL — ABNORMAL HIGH (ref 70–99)
Potassium: 4 mmol/L (ref 3.5–5.1)
Sodium: 138 mmol/L (ref 135–145)
Total Bilirubin: 0.7 mg/dL (ref 0.3–1.2)
Total Protein: 7.2 g/dL (ref 6.5–8.1)

## 2018-02-24 LAB — CBC WITH DIFFERENTIAL/PLATELET
Abs Immature Granulocytes: 0.03 10*3/uL (ref 0.00–0.07)
Basophils Absolute: 0.1 10*3/uL (ref 0.0–0.1)
Basophils Relative: 1 %
Eosinophils Absolute: 0.2 10*3/uL (ref 0.0–0.5)
Eosinophils Relative: 2 %
HCT: 32.3 % — ABNORMAL LOW (ref 36.0–46.0)
Hemoglobin: 10.7 g/dL — ABNORMAL LOW (ref 12.0–15.0)
Immature Granulocytes: 0 %
Lymphocytes Relative: 19 %
Lymphs Abs: 1.6 10*3/uL (ref 0.7–4.0)
MCH: 30.4 pg (ref 26.0–34.0)
MCHC: 33.1 g/dL (ref 30.0–36.0)
MCV: 91.8 fL (ref 80.0–100.0)
Monocytes Absolute: 0.7 10*3/uL (ref 0.1–1.0)
Monocytes Relative: 9 %
Neutro Abs: 5.6 10*3/uL (ref 1.7–7.7)
Neutrophils Relative %: 69 %
Platelets: 316 10*3/uL (ref 150–400)
RBC: 3.52 MIL/uL — ABNORMAL LOW (ref 3.87–5.11)
RDW: 13.1 % (ref 11.5–15.5)
WBC: 8.1 10*3/uL (ref 4.0–10.5)
nRBC: 0 % (ref 0.0–0.2)

## 2018-02-24 LAB — URINALYSIS, ROUTINE W REFLEX MICROSCOPIC
Bilirubin Urine: NEGATIVE
Glucose, UA: NEGATIVE mg/dL
Ketones, ur: NEGATIVE mg/dL
Nitrite: POSITIVE — AB
Protein, ur: 30 mg/dL — AB
Specific Gravity, Urine: 1.018 (ref 1.005–1.030)
pH: 6 (ref 5.0–8.0)

## 2018-02-24 MED ORDER — SULFAMETHOXAZOLE-TRIMETHOPRIM 800-160 MG PO TABS: 1.0000 | ORAL_TABLET | Freq: Two times a day (BID) | ORAL | 0 refills | Status: AC

## 2018-02-24 MED ORDER — FENTANYL CITRATE (PF) 100 MCG/2ML IJ SOLN
25.0000 ug | Freq: Once | INTRAMUSCULAR | Status: AC
  Administered 2018-02-24: 25 ug via INTRAVENOUS
  Filled 2018-02-24: qty 2

## 2018-02-24 MED ORDER — VALACYCLOVIR HCL 500 MG PO TABS
1000.0000 mg | ORAL_TABLET | Freq: Once | ORAL | Status: AC
  Administered 2018-02-24: 1000 mg via ORAL
  Filled 2018-02-24: qty 2

## 2018-02-24 MED ORDER — VALACYCLOVIR HCL 1 G PO TABS: 1000.0000 mg | ORAL_TABLET | Freq: Three times a day (TID) | ORAL | 0 refills | Status: DC

## 2018-02-24 MED ORDER — GABAPENTIN 300 MG PO CAPS
300.0000 mg | ORAL_CAPSULE | Freq: Once | ORAL | Status: AC
  Administered 2018-02-24: 300 mg via ORAL
  Filled 2018-02-24: qty 1

## 2018-02-24 NOTE — ED Triage Notes (Signed)
Pt reports 1 week of R flank pain and burning with urination.

## 2018-02-24 NOTE — ED Provider Notes (Signed)
[facility] EMERGENCY DEPARTMENT Provider Note   CSN: [id number] Arrival date & time: 02/24/18  0818     History   Chief Complaint No chief complaint on file.   HPI [name] is a 68 y.o. female.  HPI Patient presents with concern of new left flank pain.  Pain began yesterday, with associated skin changes. After the pain is initially left flank, began to radiate through the left lateral abdomen. Pain is sharp, severe, burning. No associated nausea, vomiting, fever, chills per Patient has multiple medical issues including diabetes, CAD, hypertension, and right-sided sciatica pain, which is unchanged, though it is the first thing she mentions when asked how she is doing. Since onset yesterday, no relief with anything, symptoms are worse with activity, motion. Past Medical History:  Diagnosis Date  . Anemia    a. Noted on 07/2012 labs, instructed to f/u PCP.  Marland Kitchen Arthritis    "joints" (11/18/2012)  . CAD (coronary artery disease), native coronary artery    a. Nonobstructive by cath 02/2012 (done because of low EF).  . Chronic bronchitis ([place])    "~ every other year" (11/18/2012)  . Chronic combined systolic and diastolic CHF (congestive heart failure) ([place])    a. 03/05/12 echo:  LVEF 20-25%, moderate LVH , inferior and basal to mid septal akinesis, anterior moderate to severe hypokinesis and grade 2 diastolic dysfunction. b. EF 07/2012: EF still 25% (unclear medication compliance).  . Chronic lower back pain   . Headache(784.0)    "often; maybe not daily" (11/18/2012)  . High cholesterol   . History of noncompliance with medical treatment   . Hypertension   . LBBB (left bundle branch block)   . Orthopnea   . Tobacco abuse   . Type II diabetes mellitus [facility])     Patient Active Problem List   Diagnosis Date Noted  . Right foot infection 10/22/2017  . Cellulitis in diabetic foot ([place]) 10/22/2017  . Hyperglycemia 10/22/2017  . Lumbar radiculopathy  09/18/2017  . Degenerative spondylolisthesis 09/18/2017  . Atrial fibrillation ([place]) 02/11/2017  . Paroxysmal A-fib ([place])   . Biventricular automatic implantable cardioverter defibrillator in situ   . Sepsis ([place]) 04/20/2016  . UTI (urinary tract infection) 04/20/2016  . Dyspnea 07/19/2015  . Interstitial lung disease ([place]) 11/20/2014  . SIRS (systemic inflammatory response syndrome) ([place]) 02/03/2014  . IDDM (insulin dependent diabetes mellitus) ([place]) 02/03/2014  . Tachycardia 06/14/2013  . Chronic systolic CHF (congestive heart failure) ([place]) 09/29/2012  . Hypoxia 03/06/2012  . Hypertensive heart disease 03/06/2012  . Nonischemic cardiomyopathy ([place]) 03/06/2012  . Tobacco abuse 03/06/2012  . Type 2 diabetes mellitus ([place]) 03/06/2012  . Hypertension 03/06/2012  . CAD (coronary artery disease), native coronary artery 03/06/2012  . Hypokalemia 03/06/2012  . Acute bronchitis 02/01/2009  . Sleep apnea 02/01/2009  . Chest pain 02/01/2009    Past Surgical History:  Procedure Laterality Date  . BI-VENTRICULAR IMPLANTABLE CARDIOVERTER DEFIBRILLATOR N/A 11/18/2012   Procedure: BI-VENTRICULAR IMPLANTABLE CARDIOVERTER DEFIBRILLATOR  (CRT-D);  Surgeon: Evans Lance, MD;  Location: [facility] CATH LAB;  Service: Cardiovascular;  Laterality: N/A;  . BI-VENTRICULAR IMPLANTABLE CARDIOVERTER DEFIBRILLATOR  (CRT-D)  11/18/2012  . CARDIAC CATHETERIZATION  03/04/12   nonobstructive CAD, elevated LVEDP and tortuous vessels suggestive of long-standing hypertension  . COLONOSCOPY WITH PROPOFOL N/A 12/12/2016   Procedure: COLONOSCOPY WITH PROPOFOL;  Surgeon: Carol Ada, MD;  Location: [facility] ENDOSCOPY;  Service: Endoscopy;  Laterality: N/A;  . JOINT REPLACEMENT     Bilateral hip and  right knee  . LEFT HEART CATH N/A 03/05/2012   Procedure: LEFT HEART CATH;  Surgeon: Larey Dresser, MD;  Location: [facility] CATH LAB;  Service: Cardiovascular;  Laterality: N/A;     OB History   No obstetric history on file.       Home Medications    Prior to Admission medications   Medication Sig Start Date End Date Taking? Authorizing Provider  acetaminophen (TYLENOL) 500 MG tablet Take 1 tablet (500 mg total) by mouth every 6 (six) hours as needed. Patient not taking: Reported on 12/22/2017 09/15/15   Leo Grosser, MD  albuterol (PROVENTIL HFA;VENTOLIN HFA) 108 (90 Base) MCG/ACT inhaler Inhale 2 puffs into the lungs every 6 (six) hours as needed for wheezing or shortness of breath. Patient not taking: Reported on 12/22/2017 04/25/16   Jonetta Osgood, MD  apixaban (ELIQUIS) 5 MG TABS tablet Take 1 tablet (5 mg total) by mouth 2 (two) times daily. 01/13/18   Larey Dresser, MD  atorvastatin (LIPITOR) 40 MG tablet Take 1 tablet (40 mg total) by mouth daily. 06/17/17   Larey Dresser, MD  carvedilol (COREG) 25 MG tablet Take 1 tablet (25 mg total) by mouth 2 (two) times daily with a meal. 03/17/17   Karishma Friar, PA-C  ENTRESTO 97-103 MG TAKE 1 TABLET BY MOUTH TWICE DAILY 06/17/17   Bensimhon, Shaune Pascal, MD  furosemide (LASIX) 40 MG tablet Take 1 tablet (40 mg total) by mouth 2 (two) times daily. 05/19/17   Krystal Friar, PA-C  gabapentin (NEURONTIN) 300 MG capsule Take 300 mg by mouth 2 (two) times daily.  06/03/17   [provider]  glucose blood (FREESTYLE TEST STRIPS) test strip Use as instructed 04/25/16   Ghimire, Henreitta Leber, MD  glucose monitoring kit (FREESTYLE) monitoring kit 1 each by Does not apply route 4 (four) times daily - after meals and at bedtime. 1 month Diabetic Testing Supplies for QAC-QHS accuchecks. 04/25/16   Ghimire, Henreitta Leber, MD  insulin aspart (NOVOLOG FLEXPEN) 100 UNIT/ML FlexPen 0-20 Units, Subcutaneous, 3 times daily with meals CBG < 70: implement hypoglycemia protocol CBG 70 - 120: 0 units CBG 121 - 150: 3 units CBG 151 - 200: 4 units CBG 201 - 250: 7 units CBG 251 - 300: 11 units CBG 301 - 350: 15 units CBG 351 - 400: 20 units CBG > 400: call  MD Patient not taking: Reported on 12/22/2017 04/25/16   Jonetta Osgood, MD  Insulin Glargine (LANTUS SOLOSTAR) 100 UNIT/ML Solostar Pen Inject 50 Units into the skin daily at 10 pm. 10/20/17   Recardo Evangelist, PA-C  Insulin Pen Needle 32G X 8 MM MISC Use as directed 06/18/16   Arbutus Leas, NP  isosorbide-hydrALAZINE (BIDIL) 20-37.5 MG tablet Take 1 tablet by mouth 3 (three) times daily. 12/14/17   Larey Dresser, MD  Lancets (FREESTYLE) lancets Use as instructed 06/18/16   Arbutus Leas, NP  metFORMIN (GLUCOPHAGE) 1000 MG tablet TAKE 1 TABLET(1000 MG) BY MOUTH TWICE DAILY WITH A MEAL Patient taking differently: Take 1,000 mg by mouth 2 (two) times daily.  05/30/16   Marvine Friar, PA-C  naproxen sodium (ALEVE) 220 MG tablet Take 220 mg by mouth daily as needed (for pain or headache).     [provider]  Oxycodone HCl 10 MG TABS Take 10 mg by mouth 4 (four) times daily as needed (for pain).    [provider]  polyethylene glycol (MIRALAX / GLYCOLAX)  packet Take 17 g by mouth daily as needed for mild constipation. Patient not taking: Reported on 12/22/2017 10/25/17   Raiford Noble Latif, DO  Potassium Chloride ER 20 MEQ TBCR Take 40 mEq by mouth daily. 12/22/17   Larey Dresser, MD  spironolactone (ALDACTONE) 25 MG tablet TAKE 1 TABLET(25 MG) BY MOUTH DAILY 02/10/18   Larey Dresser, MD  sulfamethoxazole-trimethoprim (BACTRIM DS,SEPTRA DS) 800-160 MG tablet Take 1 tablet by mouth 2 (two) times daily for 7 days. 02/24/18 03/03/18  Carmin Muskrat, MD  Tafamidis (VYNDAMAX) 61 MG CAPS Take 61 mg by mouth daily. 01/28/18   Larey Dresser, MD  valACYclovir (VALTREX) 1000 MG tablet Take 1 tablet (1,000 mg total) by mouth 3 (three) times daily. 02/24/18   Carmin Muskrat, MD    Family History Family History  Problem Relation Age of Onset  . Heart disease Neg Hx     Social History Social History   Tobacco Use  . Smoking status: Former Smoker    Packs/day: 1.00     Years: 40.00    Pack years: 40.00    Types: Cigarettes    Start date: 06/23/1972    Last attempt to quit: 03/26/2012    Years since quitting: 5.9  . Smokeless tobacco: Never Used  Substance Use Topics  . Alcohol use: No  . Drug use: No     Allergies   Morphine and related and Penicillins   Review of Systems Review of Systems  Constitutional:       Per HPI, otherwise negative  HENT:       Per HPI, otherwise negative  Respiratory:       Per HPI, otherwise negative  Cardiovascular:       Per HPI, otherwise negative  Gastrointestinal: Negative for vomiting.  Endocrine:       Negative aside from HPI  Genitourinary:       Neg aside from HPI   Musculoskeletal:       Per HPI, otherwise negative  Skin: Positive for rash.  Allergic/Immunologic: Negative for immunocompromised state.  Neurological: Negative for syncope.     Physical Exam Updated Vital Signs BP (!) 173/84 (BP Location: Right Arm)   Pulse 87   Temp 98.7 F (37.1 C) (Oral)   Resp 16   SpO2 99%   Physical Exam Vitals signs and nursing note reviewed.  Constitutional:      General: She is not in acute distress.    Comments: Obese elderly female awake and alert  HENT:     Head: Normocephalic and atraumatic.  Eyes:     Conjunctiva/sclera: Conjunctivae normal.  Cardiovascular:     Rate and Rhythm: Normal rate and regular rhythm.  Pulmonary:     Effort: Pulmonary effort is normal. No respiratory distress.     Breath sounds: Normal breath sounds. No stridor.  Abdominal:     General: There is no distension.     Tenderness: There is no abdominal tenderness. There is no guarding or rebound.  Skin:    General: Skin is warm and dry.       Neurological:     Mental Status: She is alert and oriented to person, place, and time.     Cranial Nerves: No cranial nerve deficit.      ED Treatments / Results  Labs (all labs ordered are listed, but only abnormal results are displayed) Labs Reviewed   COMPREHENSIVE METABOLIC PANEL - Abnormal; Notable for the following components:  Result Value   Glucose, Bld 294 (*)    AST 14 (*)    All other components within normal limits  CBC WITH DIFFERENTIAL/PLATELET - Abnormal; Notable for the following components:   RBC 3.52 (*)    Hemoglobin 10.7 (*)    HCT 32.3 (*)    All other components within normal limits  URINALYSIS, ROUTINE W REFLEX MICROSCOPIC - Abnormal; Notable for the following components:   APPearance CLOUDY (*)    Hgb urine dipstick SMALL (*)    Protein, ur 30 (*)    Nitrite POSITIVE (*)    Leukocytes, UA LARGE (*)    Bacteria, UA MANY (*)    All other components within normal limits    Procedures Procedures (including critical care time)  Medications Ordered in ED Medications  fentaNYL (SUBLIMAZE) injection 25 mcg (25 mcg Intravenous Given 02/24/18 0916)  gabapentin (NEURONTIN) capsule 300 mg (300 mg Oral Given 02/24/18 0914)  valACYclovir (VALTREX) tablet 1,000 mg (1,000 mg Oral Given 02/24/18 0939)     Initial Impression / Assessment and Plan / ED Course  I have reviewed the triage vital signs and the nursing notes.  Pertinent labs & imaging results that were available during my care of the patient were reviewed by me and considered in my medical decision making (see chart for details).     11:17 AM Repeat exam the patient is in no distress, sitting upright, speaking clearly. We discussed all findings, with family members not present. Patient is aware of UTI, shingles. Patient and I discussed pain medication for her shingles, and she notes that she can follow-up with her pain management specialist who prescribes her hydrocodone. No evidence for disseminated zoster, nor bacteremia or sepsis. Patient now notes that she recently was diagnosed with STD, has completed those antibiotics as well She was encouraged to follow-up with her primary care physician, discharged with antibiotics for UTI, antiviral for  shingles, home management recommendations, return precautions.  Final Clinical Impressions(s) / ED Diagnoses   Final diagnoses:  Herpes zoster without complication  Lower urinary tract infectious disease    ED Discharge Orders         Ordered    sulfamethoxazole-trimethoprim (BACTRIM DS,SEPTRA DS) 800-160 MG tablet  2 times daily     02/24/18 1111    valACYclovir (VALTREX) 1000 MG tablet  3 times daily     02/24/18 1111           Carmin Muskrat, MD 02/24/18 1118

## 2018-02-24 NOTE — Discharge Instructions (Addendum)
Please monitor your condition carefully, and do not hesitate to return here for concerning changes in your condition.  In addition to your ongoing pain medication regimen, including gabapentin and hydrocodone, you may obtain additional relief using over-the-counter creams, such as capsaicin cream.

## 2018-03-01 ENCOUNTER — Ambulatory Visit (INDEPENDENT_AMBULATORY_CARE_PROVIDER_SITE_OTHER): Payer: Medicare Other

## 2018-03-01 DIAGNOSIS — I428 Other cardiomyopathies: Secondary | ICD-10-CM

## 2018-03-01 DIAGNOSIS — I5022 Chronic systolic (congestive) heart failure: Secondary | ICD-10-CM

## 2018-03-02 ENCOUNTER — Telehealth (HOSPITAL_COMMUNITY): Payer: Self-pay

## 2018-03-02 NOTE — Telephone Encounter (Signed)
I called [name] to schedule an appointment. She stated she would be available any time so I suggest we meet at 10:00 on Thursday. She was agreeable.

## 2018-03-04 ENCOUNTER — Other Ambulatory Visit (HOSPITAL_COMMUNITY): Payer: Self-pay

## 2018-03-04 LAB — CUP PACEART REMOTE DEVICE CHECK
Battery Remaining Longevity: 27 mo
Battery Voltage: 2.89 V
Brady Statistic AP VP Percent: 0.03 %
Brady Statistic AP VS Percent: 0 %
Brady Statistic AS VP Percent: 94.59 %
Brady Statistic AS VS Percent: 5.38 %
Brady Statistic RA Percent Paced: 0.03 %
Brady Statistic RV Percent Paced: 12.11 %
Date Time Interrogation Session: 20200203083521
HighPow Impedance: 65 Ohm
Implantable Lead Implant Date: 20141023
Implantable Lead Implant Date: 20141023
Implantable Lead Implant Date: 20141023
Implantable Lead Location: 753858
Implantable Lead Location: 753859
Implantable Lead Location: 753860
Implantable Lead Model: 4298
Implantable Lead Model: 5076
Implantable Lead Model: 6935
Implantable Pulse Generator Implant Date: 20141023
Lead Channel Impedance Value: 285 Ohm
Lead Channel Impedance Value: 304 Ohm
Lead Channel Impedance Value: 304 Ohm
Lead Channel Impedance Value: 304 Ohm
Lead Channel Impedance Value: 399 Ohm
Lead Channel Impedance Value: 399 Ohm
Lead Channel Impedance Value: 418 Ohm
Lead Channel Impedance Value: 456 Ohm
Lead Channel Impedance Value: 475 Ohm
Lead Channel Impedance Value: 513 Ohm
Lead Channel Impedance Value: 513 Ohm
Lead Channel Impedance Value: 513 Ohm
Lead Channel Impedance Value: 532 Ohm
Lead Channel Pacing Threshold Amplitude: 0.375 V
Lead Channel Pacing Threshold Amplitude: 0.625 V
Lead Channel Pacing Threshold Amplitude: 1.25 V
Lead Channel Pacing Threshold Pulse Width: 0.4 ms
Lead Channel Pacing Threshold Pulse Width: 0.4 ms
Lead Channel Pacing Threshold Pulse Width: 0.4 ms
Lead Channel Sensing Intrinsic Amplitude: 13.125 mV
Lead Channel Sensing Intrinsic Amplitude: 13.125 mV
Lead Channel Sensing Intrinsic Amplitude: 3.125 mV
Lead Channel Sensing Intrinsic Amplitude: 3.125 mV
Lead Channel Setting Pacing Amplitude: 1.75 V
Lead Channel Setting Pacing Amplitude: 2.5 V
Lead Channel Setting Pacing Amplitude: 2.75 V
Lead Channel Setting Pacing Pulse Width: 0.4 ms
Lead Channel Setting Pacing Pulse Width: 0.4 ms
Lead Channel Setting Sensing Sensitivity: 0.45 mV

## 2018-03-04 NOTE — Progress Notes (Signed)
Paramedicine Encounter    Patient ID: [name], female    DOB: [birth year], 68 y.o.   MRN: [id number]   Patient Care Team: Jeanella Anton, NP as PCP - General (Nurse Practitioner) Jorge Ny, LCSW as Social Worker (Licensed Clinical Social Worker)  Patient Active Problem List   Diagnosis Date Noted  . Right foot infection 10/22/2017  . Cellulitis in diabetic foot ([place]) 10/22/2017  . Hyperglycemia 10/22/2017  . Lumbar radiculopathy 09/18/2017  . Degenerative spondylolisthesis 09/18/2017  . Atrial fibrillation ([place]) 02/11/2017  . Paroxysmal A-fib ([place])   . Biventricular automatic implantable cardioverter defibrillator in situ   . Sepsis ([place]) 04/20/2016  . UTI (urinary tract infection) 04/20/2016  . Dyspnea 07/19/2015  . Interstitial lung disease ([place]) 11/20/2014  . SIRS (systemic inflammatory response syndrome) ([place]) 02/03/2014  . IDDM (insulin dependent diabetes mellitus) ([place]) 02/03/2014  . Tachycardia 06/14/2013  . Chronic systolic CHF (congestive heart failure) ([place]) 09/29/2012  . Hypoxia 03/06/2012  . Hypertensive heart disease 03/06/2012  . Nonischemic cardiomyopathy ([place]) 03/06/2012  . Tobacco abuse 03/06/2012  . Type 2 diabetes mellitus ([place]) 03/06/2012  . Hypertension 03/06/2012  . CAD (coronary artery disease), native coronary artery 03/06/2012  . Hypokalemia 03/06/2012  . Acute bronchitis 02/01/2009  . Sleep apnea 02/01/2009  . Chest pain 02/01/2009    Current Outpatient Medications:  .  acetaminophen (TYLENOL) 500 MG tablet, Take 1 tablet (500 mg total) by mouth every 6 (six) hours as needed., Disp: 30 tablet, Rfl: 0 .  apixaban (ELIQUIS) 5 MG TABS tablet, Take 1 tablet (5 mg total) by mouth 2 (two) times daily., Disp: 60 tablet, Rfl: 3 .  atorvastatin (LIPITOR) 40 MG tablet, Take 1 tablet (40 mg total) by mouth daily., Disp: 30 tablet, Rfl: 3 .  carvedilol (COREG) 25 MG tablet, Take 1 tablet (25 mg total) by mouth 2 (two) times daily with a meal.,  Disp: 60 tablet, Rfl: 11 .  ENTRESTO 97-103 MG, TAKE 1 TABLET BY MOUTH TWICE DAILY, Disp: 60 tablet, Rfl: 6 .  furosemide (LASIX) 40 MG tablet, Take 1 tablet (40 mg total) by mouth 2 (two) times daily., Disp: 180 tablet, Rfl: 3 .  gabapentin (NEURONTIN) 300 MG capsule, Take 300 mg by mouth 2 (two) times daily. , Disp: , Rfl: 0 .  glucose monitoring kit (FREESTYLE) monitoring kit, 1 each by Does not apply route 4 (four) times daily - after meals and at bedtime. 1 month Diabetic Testing Supplies for QAC-QHS accuchecks., Disp: 1 each, Rfl: 1 .  Insulin Glargine (LANTUS SOLOSTAR) 100 UNIT/ML Solostar Pen, Inject 50 Units into the skin daily at 10 pm., Disp: 15 mL, Rfl: 0 .  isosorbide-hydrALAZINE (BIDIL) 20-37.5 MG tablet, Take 1 tablet by mouth 3 (three) times daily., Disp: 90 tablet, Rfl: 6 .  metFORMIN (GLUCOPHAGE) 1000 MG tablet, TAKE 1 TABLET(1000 MG) BY MOUTH TWICE DAILY WITH A MEAL (Patient taking differently: Take 1,000 mg by mouth 2 (two) times daily. ), Disp: 60 tablet, Rfl: 0 .  Oxycodone HCl 10 MG TABS, Take 10 mg by mouth 4 (four) times daily as needed (for pain)., Disp: , Rfl:  .  Potassium Chloride ER 20 MEQ TBCR, Take 40 mEq by mouth daily., Disp: 60 tablet, Rfl: 5 .  spironolactone (ALDACTONE) 25 MG tablet, TAKE 1 TABLET(25 MG) BY MOUTH DAILY, Disp: 30 tablet, Rfl: 3 .  Tafamidis (VYNDAMAX) 61 MG CAPS, Take 61 mg by mouth daily., Disp: 30 capsule, Rfl: 11 .  valACYclovir (VALTREX) 1000 MG tablet,  Take 1 tablet (1,000 mg total) by mouth 3 (three) times daily., Disp: 21 tablet, Rfl: 0 .  albuterol (PROVENTIL HFA;VENTOLIN HFA) 108 (90 Base) MCG/ACT inhaler, Inhale 2 puffs into the lungs every 6 (six) hours as needed for wheezing or shortness of breath. (Patient not taking: Reported on 03/04/2018), Disp: 18 g, Rfl: 0 .  glucose blood (FREESTYLE TEST STRIPS) test strip, Use as instructed, Disp: 100 each, Rfl: 0 .  insulin aspart (NOVOLOG FLEXPEN) 100 UNIT/ML FlexPen, 0-20 Units, Subcutaneous, 3  times daily with meals CBG < 70: implement hypoglycemia protocol CBG 70 - 120: 0 units CBG 121 - 150: 3 units CBG 151 - 200: 4 units CBG 201 - 250: 7 units CBG 251 - 300: 11 units CBG 301 - 350: 15 units CBG 351 - 400: 20 units CBG > 400: call MD (Patient not taking: Reported on 03/04/2018), Disp: 15 mL, Rfl: 0 .  Insulin Pen Needle 32G X 8 MM MISC, Use as directed, Disp: 100 each, Rfl: 0 .  Lancets (FREESTYLE) lancets, Use as instructed, Disp: 100 each, Rfl: 0 .  naproxen sodium (ALEVE) 220 MG tablet, Take 220 mg by mouth daily as needed (for pain or headache). , Disp: , Rfl:  .  polyethylene glycol (MIRALAX / GLYCOLAX) packet, Take 17 g by mouth daily as needed for mild constipation. (Patient not taking: Reported on 12/22/2017), Disp: 14 each, Rfl: 0 Allergies  Allergen Reactions  . Morphine And Related Nausea Only    Severe nausea  . Penicillins Other (See Comments)    Pt states she has had a pain in her leg since a penicillin injection 2 months ago (reported 08/31/17).  Has tolerated amoxicillin oral.  Has patient had a PCN reaction causing immediate rash, facial/tongue/throat swelling, SOB or lightheadedness with hypotension: No Has patient had a PCN reaction causing severe rash involving mucus membranes or skin necrosis: No Has patient had a PCN reaction that required hospitalization: No Has patient had a PCN reaction occurring within the last 10 years: Yes If a      Social History   Socioeconomic History  . Marital status: Single    Spouse name: Not on file  . Number of children: 4  . Years of education: 39  . Highest education level: Not on file  Occupational History  . Not on file  Social Needs  . Financial resource strain: Not hard at all  . Food insecurity:    Worry: Never true    Inability: Never true  . Transportation needs:    Medical: No    Non-medical: No  Tobacco Use  . Smoking status: Former Smoker    Packs/day: 1.00    Years: 40.00    Pack years: 40.00     Types: Cigarettes    Start date: 06/23/1972    Last attempt to quit: 03/26/2012    Years since quitting: 5.9  . Smokeless tobacco: Never Used  Substance and Sexual Activity  . Alcohol use: No  . Drug use: No  . Sexual activity: Yes  Lifestyle  . Physical activity:    Days per week: Not on file    Minutes per session: Not on file  . Stress: Not on file  Relationships  . Social connections:    Talks on phone: Not on file    Gets together: Not on file    Attends religious service: Not on file    Active member of club or organization: Not on file    Attends  meetings of clubs or organizations: Not on file    Relationship status: Not on file  . Intimate partner violence:    Fear of current or ex partner: Not on file    Emotionally abused: Not on file    Physically abused: Not on file    Forced sexual activity: Not on file  Other Topics Concern  . Not on file  Social History Narrative  . Not on file    Physical Exam Cardiovascular:     Rate and Rhythm: Normal rate and regular rhythm.     Pulses: Normal pulses.  Pulmonary:     Effort: Pulmonary effort is normal.     Breath sounds: Normal breath sounds.  Musculoskeletal: Normal range of motion.     Right lower leg: No edema.     Left lower leg: No edema.  Skin:    General: Skin is warm and dry.     Capillary Refill: Capillary refill takes less than 2 seconds.  Neurological:     Mental Status: She is alert and oriented to person, place, and time.  Psychiatric:        Mood and Affect: Mood normal.         Future Appointments  Date Time Provider [place]  03/25/2018  9:40 AM Larey Dresser, MD [facility] None    BP 110/60 (BP Location: Left Arm, Patient Position: Sitting, Cuff Size: Large)   Pulse 94   Resp 18   Wt 222 lb 9.6 oz (101 kg)   SpO2 97%   BMI 34.86 kg/m   Weight yesterday- DNW Last visit weight- 224 lb  Ms Callicott was seen at home today and reported feeling well. She denied chest pain, SOB,  headache, dizziness or orthopnea. She stated she has been compliant with her medications over the past two weeks however she still had several days worth of medicine in her pillbox. She is not weighing daily, despite being reminded regularly of its importance. Her medications were verified and her pillbox was refilled. I will return tomorrow to finish filling he pillbox after picking up her medication from the pharmacy.   [name], EMT 03/04/18  ACTION: Home visit completed Next visit planned for tomorrow for med rec.

## 2018-03-05 ENCOUNTER — Telehealth (HOSPITAL_COMMUNITY): Payer: Self-pay

## 2018-03-05 NOTE — Telephone Encounter (Signed)
I called [name] to let her know I was coming by to drop off her medications. She stated she was not home and asked that I keep them until next week. I will try to deliver them again on Tuesday.

## 2018-03-09 ENCOUNTER — Other Ambulatory Visit (HOSPITAL_COMMUNITY): Payer: Self-pay

## 2018-03-09 NOTE — Progress Notes (Signed)
Remote ICD transmission.   

## 2018-03-09 NOTE — Progress Notes (Signed)
I went to Ms Mazer's house to deliver her medication which I picked up last week. She was not home but her son let me in and I completed her pillbox while I was there. I will follow up next week.

## 2018-03-14 NOTE — Progress Notes (Signed)
[facility] Note  03/15/2018     CHIEF COMPLAINT Patient presents for Retina Evaluation   HISTORY OF PRESENT ILLNESS: [name] is a 68 y.o. female who presents to the clinic today for:   HPI    Retina Evaluation    In right eye.  This started 1 week ago.  Duration of 1 week.  Associated Symptoms Distortion.  Context:  distance vision, mid-range vision and near vision.  Treatments tried include artificial tears.  Response to treatment was no improvement.  I, the attending physician,  performed the HPI with the patient and updated documentation appropriately.          Comments    68 y/o female pt referred by Dr. Aleshka Muscat for eval of BRVO/ERM OD.  Pt. Saw Dr. Jaquel Muscat 1 wk ago for routine eye exam; had no idea anything was wrong.  Feels [facility] OU has improved some in the past wk.  Feels [facility] is good OU.  Is waiting on new glasses, which have been ordered.  Denies pain, flashes, floaters, headaches.  AT prn OU.  BS 143 1 wk ago.  A1C unknown.       Last edited by Bernarda Caffey, MD on 03/15/2018  8:40 AM. (History)    pt states she saw Dr. Glenys Muscat for routine eye exam, she states she will now be a regular pt of his, she states he told her she had a problem in the back of her eye, she says she was not aware she had a problem, but she knew her left eye was worse than her right, she says sometime she is able to read her Bible and sometimes she cannot, pt endorses having diabetes and HTN, and states she is having a hard time controlling them  Referring physician: Calton Dach, MD [place], [place] [id number]  HISTORICAL INFORMATION:   Selected notes from the MEDICAL RECORD NUMBER Referred by Dr. Thurston Hole for concern of BRVO OD / ERM OD LEE: 02.10.20 (S. Bernstorf) [BCVA: OD: 20/25- OS: 20/25] Ocular Hx-DES OU, narrow angles PMH-DM (metformin, novolin, lantus), HTN, HLD, heart disease    CURRENT MEDICATIONS: Current Outpatient  Medications (Ophthalmic Drugs)  Medication Sig  . prednisoLONE acetate (PRED FORTE) 1 % ophthalmic suspension Place 1 drop into the right eye 4 (four) times daily for 7 days.   No current facility-administered medications for this visit.  (Ophthalmic Drugs)   Current Outpatient Medications (Other)  Medication Sig  . acetaminophen (TYLENOL) 500 MG tablet Take 1 tablet (500 mg total) by mouth every 6 (six) hours as needed.  Marland Kitchen albuterol (PROVENTIL HFA;VENTOLIN HFA) 108 (90 Base) MCG/ACT inhaler Inhale 2 puffs into the lungs every 6 (six) hours as needed for wheezing or shortness of breath.  Marland Kitchen apixaban (ELIQUIS) 5 MG TABS tablet Take 1 tablet (5 mg total) by mouth 2 (two) times daily.  Marland Kitchen atorvastatin (LIPITOR) 40 MG tablet Take 1 tablet (40 mg total) by mouth daily.  . baclofen (LIORESAL) 10 MG tablet TK 1 T PO BID PRN  . carvedilol (COREG) 25 MG tablet Take 1 tablet (25 mg total) by mouth 2 (two) times daily with a meal.  . ENTRESTO 97-103 MG TAKE 1 TABLET BY MOUTH TWICE DAILY  . furosemide (LASIX) 40 MG tablet Take 1 tablet (40 mg total) by mouth 2 (two) times daily.  Marland Kitchen gabapentin (NEURONTIN) 100 MG capsule TK 1 C PO TID  . gabapentin (NEURONTIN) 300 MG capsule Take  300 mg by mouth 2 (two) times daily.   Marland Kitchen glucose blood (FREESTYLE TEST STRIPS) test strip Use as instructed  . glucose monitoring kit (FREESTYLE) monitoring kit 1 each by Does not apply route 4 (four) times daily - after meals and at bedtime. 1 month Diabetic Testing Supplies for QAC-QHS accuchecks.  Marland Kitchen HUMALOG KWIKPEN 100 UNIT/ML KwikPen INJECT 10 UNITS INTO THE SKIN WITH EACH MEAL  . insulin aspart (NOVOLOG FLEXPEN) 100 UNIT/ML FlexPen 0-20 Units, Subcutaneous, 3 times daily with meals CBG < 70: implement hypoglycemia protocol CBG 70 - 120: 0 units CBG 121 - 150: 3 units CBG 151 - 200: 4 units CBG 201 - 250: 7 units CBG 251 - 300: 11 units CBG 301 - 350: 15 units CBG 351 - 400: 20 units CBG > 400: call MD  . Insulin Glargine  (LANTUS SOLOSTAR) 100 UNIT/ML Solostar Pen Inject 50 Units into the skin daily at 10 pm.  . Insulin Pen Needle 32G X 8 MM MISC Use as directed  . isosorbide-hydrALAZINE (BIDIL) 20-37.5 MG tablet Take 1 tablet by mouth 3 (three) times daily.  . Lancets (FREESTYLE) lancets Use as instructed  . metFORMIN (GLUCOPHAGE) 1000 MG tablet TAKE 1 TABLET(1000 MG) BY MOUTH TWICE DAILY WITH A MEAL (Patient taking differently: Take 1,000 mg by mouth 2 (two) times daily. )  . metroNIDAZOLE (FLAGYL) 500 MG tablet TK 2 TS PO D  . naproxen sodium (ALEVE) 220 MG tablet Take 220 mg by mouth daily as needed (for pain or headache).   . Oxycodone HCl 10 MG TABS Take 10 mg by mouth 4 (four) times daily as needed (for pain).  . polyethylene glycol (MIRALAX / GLYCOLAX) packet Take 17 g by mouth daily as needed for mild constipation.  . Potassium Chloride ER 20 MEQ TBCR Take 40 mEq by mouth daily.  Marland Kitchen spironolactone (ALDACTONE) 25 MG tablet TAKE 1 TABLET(25 MG) BY MOUTH DAILY  . Tafamidis (VYNDAMAX) 61 MG CAPS Take 61 mg by mouth daily.  . valACYclovir (VALTREX) 1000 MG tablet Take 1 tablet (1,000 mg total) by mouth 3 (three) times daily.  . Vitamin D, Ergocalciferol, (DRISDOL) 1.25 MG (50000 UT) CAPS capsule TK 1 C PO WEEKLY   No current facility-administered medications for this visit.  (Other)      REVIEW OF SYSTEMS: ROS    Positive for: Endocrine, Cardiovascular, Eyes, Respiratory   Negative for: Constitutional, Gastrointestinal, Neurological, Skin, Genitourinary, Musculoskeletal, HENT, Psychiatric, Allergic/Imm, Heme/Lymph   Last edited by Matthew Folks, COA on 03/15/2018  8:15 AM. (History)       ALLERGIES Allergies  Allergen Reactions  . Morphine And Related Nausea Only    Severe nausea  . Penicillins Other (See Comments)    Pt states she has had a pain in her leg since a penicillin injection 2 months ago (reported 08/31/17).  Has tolerated amoxicillin oral.  Has patient had a PCN reaction causing  immediate rash, facial/tongue/throat swelling, SOB or lightheadedness with hypotension: No Has patient had a PCN reaction causing severe rash involving mucus membranes or skin necrosis: No Has patient had a PCN reaction that required hospitalization: No Has patient had a PCN reaction occurring within the last 10 years: Yes If a    PAST MEDICAL HISTORY Past Medical History:  Diagnosis Date  . Anemia    a. Noted on 07/2012 labs, instructed to f/u PCP.  Marland Kitchen Arthritis    "joints" (11/18/2012)  . CAD (coronary artery disease), native coronary artery    a.  Nonobstructive by cath 02/2012 (done because of low EF).  . Chronic bronchitis ([place])    "~ every other year" (11/18/2012)  . Chronic combined systolic and diastolic CHF (congestive heart failure) ([place])    a. 03/05/12 echo:  LVEF 20-25%, moderate LVH , inferior and basal to mid septal akinesis, anterior moderate to severe hypokinesis and grade 2 diastolic dysfunction. b. EF 07/2012: EF still 25% (unclear medication compliance).  . Chronic lower back pain   . Headache(784.0)    "often; maybe not daily" (11/18/2012)  . High cholesterol   . History of noncompliance with medical treatment   . Hypertension   . LBBB (left bundle branch block)   . Orthopnea   . Tobacco abuse   . Type II diabetes mellitus ([place])    Past Surgical History:  Procedure Laterality Date  . BI-VENTRICULAR IMPLANTABLE CARDIOVERTER DEFIBRILLATOR N/A 11/18/2012   Procedure: BI-VENTRICULAR IMPLANTABLE CARDIOVERTER DEFIBRILLATOR  (CRT-D);  Surgeon: Evans Lance, MD;  Location: [facility] CATH LAB;  Service: Cardiovascular;  Laterality: N/A;  . BI-VENTRICULAR IMPLANTABLE CARDIOVERTER DEFIBRILLATOR  (CRT-D)  11/18/2012  . CARDIAC CATHETERIZATION  03/04/12   nonobstructive CAD, elevated LVEDP and tortuous vessels suggestive of long-standing hypertension  . COLONOSCOPY WITH PROPOFOL N/A 12/12/2016   Procedure: COLONOSCOPY WITH PROPOFOL;  Surgeon: Carol Ada, MD;  Location: [facility]  ENDOSCOPY;  Service: Endoscopy;  Laterality: N/A;  . JOINT REPLACEMENT     Bilateral hip and right knee  . LEFT HEART CATH N/A 03/05/2012   Procedure: LEFT HEART CATH;  Surgeon: Larey Dresser, MD;  Location[facility] CATH LAB;  Service: Cardiovascular;  Laterality: N/A;    FAMILY HISTORY Family History  Problem Relation Age of Onset  . Heart disease Neg Hx     SOCIAL HISTORY Social History   Tobacco Use  . Smoking status: Former Smoker    Packs/day: 1.00    Years: 40.00    Pack years: 40.00    Types: Cigarettes    Start date: 06/23/1972    Last attempt to quit: 03/26/2012    Years since quitting: 5.9  . Smokeless tobacco: Never Used  Substance Use Topics  . Alcohol use: No  . Drug use: No         OPHTHALMIC EXAM:  Base Eye Exam    Visual Acuity (Snellen - Linear)      Right Left   Dist [place] 20/150 20/60 -2   Dist ph [place] 20/50 20/40       Tonometry (Tonopen, 8:19 AM)      Right Left   Pressure 15 14       Pupils      Dark Light Shape React APD   Right 4 3 Round Brisk None   Left 4 3 Round Brisk None       Visual Fields (Counting fingers)      Left Right    Full Full       Extraocular Movement      Right Left    Full, Ortho Full, Ortho       Neuro/Psych    Oriented x3:  Yes   Mood/Affect:  Normal       Dilation    Both eyes:  1.0% Mydriacyl, 2.5% Phenylephrine @ 8:19 AM        Slit Lamp and Fundus Exam    Slit Lamp Exam      Right Left   Lids/Lashes Dermatochalasis - upper lid Dermatochalasis - upper lid   Conjunctiva/Sclera nasal Pinguecula, Melanosis nasal/temporal  Pinguecula, Melanosis   Cornea 1+ Punctate epithelial erosions 1-2+ Punctate epithelial erosions   Anterior Chamber deep and clear deep and clear   Iris Round and dilated, No NVI Round and dilated, No NVI   Lens 2-3+ Nuclear sclerosis, 2-3+ Cortical cataract 2-3+ Nuclear sclerosis, 2-3+ Cortical cataract   Vitreous Vitreous syneresis Vitreous syneresis       Fundus Exam      Right  Left   Disc +cupping, mild tilt, temporal PPA sharp rim, +cupping, temporal PPA   C/D Ratio 0.6 0.8   Macula Blunted foveal reflex, 3-4+ERM, rare MA Good foveal reflex, rare MA   Vessels Vascular attenuation, veins dilated/tortuous, AV crossing changes, white, sclerotic arteriole at 1200 Vascular attenuation, Tortuous   Periphery Attached, fibrotic NV at 1200 ?cfan), pigmented cystoid degeneration at 0600   Attached           Refraction    Manifest Refraction      Sphere Cylinder Axis Dist VA   Right +1.50 Sphere  20/30   Left +1.00 +0.50 045 20/60          IMAGING AND PROCEDURES  Imaging and Procedures for '@TODAY' @  OCT, Retina - OU - Both Eyes       Right Eye Quality was good. Central Foveal Thickness: 392. Progression has no prior data. Findings include abnormal foveal contour, epiretinal membrane, no IRF, no SRF, preretinal fibrosis, macular pucker.   Left Eye Quality was good. Central Foveal Thickness: 251. Progression has no prior data. Findings include normal foveal contour, no SRF, no IRF, vitreomacular adhesion .   Notes *Images captured and stored on drive  Diagnosis / Impression:  OD: thick ERM with macular pucker OS: NFP, no IRF/SRF, mild VMA   Clinical management:  See below  Abbreviations: NFP - Normal foveal profile. CME - cystoid macular edema. PED - pigment epithelial detachment. IRF - intraretinal fluid. SRF - subretinal fluid. EZ - ellipsoid zone. ERM - epiretinal membrane. ORA - outer retinal atrophy. ORT - outer retinal tubulation. SRHM - subretinal hyper-reflective material        Fluorescein Angiography Optos (Transit OD)       Right Eye   Progression has no prior data. Early phase findings include microaneurysm, vascular perfusion defect, retinal neovascularization. Mid/Late phase findings include vascular perfusion defect, retinal neovascularization, microaneurysm, leakage.   Left Eye   Progression has no prior data. Early phase  findings include microaneurysm. Mid/Late phase findings include microaneurysm, leakage.   Notes Images stored on drive;   Impression: OD: superior vascular perfusion defect consistent with BRAO; proliferative retinopathy, post BRAO v diabetic v sickle cell; scattered late leaking MA OS: moderate NPDR         Panretinal Photocoagulation - OD - Right Eye       LASER PROCEDURE NOTE  Diagnosis:   Proliferative Retinopathy, RIGHT EYE  Procedure:  Segmental pan-retinal photocoagulation using slit lamp laser, RIGHT EYE  Anesthesia:  Topical  Surgeon: Bernarda Caffey, MD, PhD   Informed consent obtained, operative eye marked, and time out performed prior to initiation of laser.   Lumenis [id number] slit lamp laser Pattern: 3x3 square Power: 320 mW Duration: 30 msec  Spot size: 200 microns  # spots: 639 spots in area of nonperfusion superior to disc  Complications: None.  RTC: 2 wks  Patient tolerated the procedure well and received written and verbal post-procedure care information/education.                  ASSESSMENT/PLAN:  ICD-10-CM   1. Proliferative retinopathy of right eye H35.21 Fluorescein Angiography Optos (Transit OD)    Sickle Cell Panel    Sickle cell screen    Panretinal Photocoagulation - OD - Right Eye  2. BRAO (branch retinal artery occlusion), right H34.231 Fluorescein Angiography Optos (Transit OD)    Panretinal Photocoagulation - OD - Right Eye  3. Epiretinal membrane (ERM) of right eye H35.371   4. Retinal edema H35.81 OCT, Retina - OU - Both Eyes  5. Moderate nonproliferative diabetic retinopathy of both eyes without macular edema associated with type 2 diabetes mellitus ([place] H41.9379 Panretinal Photocoagulation - OD - Right Eye  6. Essential hypertension I10   7. Hypertensive retinopathy of both eyes H35.033 Fluorescein Angiography Optos (Transit OD)  8. Combined forms of age-related cataract of both eyes H25.813     1,2.  Proliferative retinopathy and BRAO OD - sclerotic superior arteriole with distal fibrotic NV (?sea fan) and overlying preretinal and vitreous heme - etiology could also be DM2 vs sickle cell retinopathy -- will check blood for sickle cell disease - discussed findings, prognosis and treatment options - recommend segmental PRP OD to areas of nonperfusion (superior) - pt wishes to proceed with segmental PRP OD today, 2.17.2020 - RBA of procedure discussed, questions answered - informed consent obtained and signed - see procedure note - start PF QID OD x7 days - f/u in 2 wks  3,4. Epiretinal membrane, OD  - The natural history, anatomy, potential for loss of vision, and treatment options including vitrectomy techniques and the complications of endophthalmitis, retinal detachment, vitreous hemorrhage, cataract progression and permanent vision loss discussed with the patient.  - fairly thick ERM overlying retina with mild pucker  - monitor for now -- will work up more once 1,2 more stable  5. Moderate nonproliferative diabetic retinopathy w/o DME, OS  - The incidence, risk factors for progression, natural history and treatment options for diabetic retinopathy were discussed with patient.    - The need for close monitoring of blood glucose, blood pressure, and serum lipids, avoiding cigarette or any type of tobacco, and the need for long term follow up was also discussed with patient.  - exam shows scattered MA OS, no NV  - FA shows no NV OS  - OCT without diabetic macular edema, OU  - f/u in 2-3 mos  6,7. Hypertensive retinopathy OU  - discussed importance of tight BP control  - monitor  8. Mixed form age related cataract OU   - The symptoms of cataract, surgical options, and treatments and risks were discussed with patient.  - discussed diagnosis and progression  - not yet visually significant  - monitor for now   Ophthalmic Meds Ordered this visit:  Meds ordered this encounter   Medications  . prednisoLONE acetate (PRED FORTE) 1 % ophthalmic suspension    Sig: Place 1 drop into the right eye 4 (four) times daily for 7 days.    Dispense:  10 mL    Refill:  0       Return in about 2 weeks (around 03/29/2018) for Dilated Exam, OCT.  There are no Patient Instructions on file for this visit.   Explained the diagnoses, plan, and follow up with the patient and they expressed understanding.  Patient expressed understanding of the importance of proper follow up care.   This document serves as a record of services personally performed by Gardiner Sleeper, MD, PhD. It was created on their behalf by Ernest Mallick,  OA, an ophthalmic assistant. The creation of this record is the provider's dictation and/or activities during the visit.    Electronically signed by: Ernest Mallick, OA  02.16.2020 12:23 PM    Gardiner Sleeper, M.D., Ph.D. Diseases & Surgery of the Retina and Vitreous [facility]  I have reviewed the above documentation for accuracy and completeness, and I agree with the above. Gardiner Sleeper, M.D., Ph.D. 03/15/18 12:23 PM     Abbreviations: M myopia (nearsighted); A astigmatism; H hyperopia (farsighted); P presbyopia; Mrx spectacle prescription;  CTL contact lenses; OD right eye; OS left eye; OU both eyes  XT exotropia; ET esotropia; PEK punctate epithelial keratitis; PEE punctate epithelial erosions; DES dry eye syndrome; MGD meibomian gland dysfunction; ATs artificial tears; PFAT's preservative free artificial tears; [place] nuclear sclerotic cataract; PSC posterior subcapsular cataract; ERM epi-retinal membrane; PVD posterior vitreous detachment; RD retinal detachment; DM diabetes mellitus; DR diabetic retinopathy; NPDR non-proliferative diabetic retinopathy; PDR proliferative diabetic retinopathy; CSME clinically significant macular edema; DME diabetic macular edema; dbh dot blot hemorrhages; CWS cotton wool spot; POAG primary open angle  glaucoma; C/D cup-to-disc ratio; HVF humphrey visual field; GVF goldmann visual field; OCT optical coherence tomography; IOP intraocular pressure; BRVO Branch retinal vein occlusion; CRVO central retinal vein occlusion; CRAO central retinal artery occlusion; BRAO branch retinal artery occlusion; RT retinal tear; SB scleral buckle; PPV pars plana vitrectomy; VH Vitreous hemorrhage; PRP panretinal laser photocoagulation; IVK intravitreal kenalog; VMT vitreomacular traction; MH Macular hole;  NVD neovascularization of the disc; NVE neovascularization elsewhere; AREDS age related eye disease study; ARMD age related macular degeneration; POAG primary open angle glaucoma; EBMD epithelial/anterior basement membrane dystrophy; ACIOL anterior chamber intraocular lens; IOL intraocular lens; PCIOL posterior chamber intraocular lens; Phaco/IOL phacoemulsification with intraocular lens placement; [place] photorefractive keratectomy; LASIK laser assisted in situ keratomileusis; HTN hypertension; DM diabetes mellitus; COPD chronic obstructive pulmonary disease

## 2018-03-15 ENCOUNTER — Encounter (INDEPENDENT_AMBULATORY_CARE_PROVIDER_SITE_OTHER): Payer: Self-pay | Admitting: Ophthalmology

## 2018-03-15 ENCOUNTER — Ambulatory Visit (INDEPENDENT_AMBULATORY_CARE_PROVIDER_SITE_OTHER): Payer: Medicare Other | Admitting: Ophthalmology

## 2018-03-15 DIAGNOSIS — H3581 Retinal edema: Secondary | ICD-10-CM

## 2018-03-15 DIAGNOSIS — H35033 Hypertensive retinopathy, bilateral: Secondary | ICD-10-CM | POA: Diagnosis not present

## 2018-03-15 DIAGNOSIS — H34231 Retinal artery branch occlusion, right eye: Secondary | ICD-10-CM

## 2018-03-15 DIAGNOSIS — E113393 Type 2 diabetes mellitus with moderate nonproliferative diabetic retinopathy without macular edema, bilateral: Secondary | ICD-10-CM | POA: Diagnosis not present

## 2018-03-15 DIAGNOSIS — H35371 Puckering of macula, right eye: Secondary | ICD-10-CM

## 2018-03-15 DIAGNOSIS — H25813 Combined forms of age-related cataract, bilateral: Secondary | ICD-10-CM

## 2018-03-15 DIAGNOSIS — I10 Essential (primary) hypertension: Secondary | ICD-10-CM

## 2018-03-15 DIAGNOSIS — H3521 Other non-diabetic proliferative retinopathy, right eye: Secondary | ICD-10-CM

## 2018-03-15 MED ORDER — PREDNISOLONE ACETATE 1 % OP SUSP: 1.0000 [drp] | Freq: Four times a day (QID) | OPHTHALMIC | 0 refills | Status: AC

## 2018-03-16 LAB — CMP14+CBC/D/PLT+FER+RETIC+V...
ALT: 6 IU/L (ref 0–32)
AST: 11 IU/L (ref 0–40)
Albumin/Globulin Ratio: 1.6 (ref 1.2–2.2)
Albumin: 4.6 g/dL (ref 3.8–4.8)
Alkaline Phosphatase: 74 IU/L (ref 39–117)
BUN/Creatinine Ratio: 18 (ref 12–28)
BUN: 19 mg/dL (ref 8–27)
Basophils Absolute: 0.1 10*3/uL (ref 0.0–0.2)
Basos: 1 %
Bilirubin Total: 0.3 mg/dL (ref 0.0–1.2)
CO2: 24 mmol/L (ref 20–29)
Calcium: 9.8 mg/dL (ref 8.7–10.3)
Chloride: 99 mmol/L (ref 96–106)
Creatinine, Ser: 1.03 mg/dL — ABNORMAL HIGH (ref 0.57–1.00)
EOS (ABSOLUTE): 0.1 10*3/uL (ref 0.0–0.4)
Eos: 2 %
Ferritin: 95 ng/mL (ref 15–150)
GFR calc Af Amer: 66 mL/min/{1.73_m2} (ref >59)
GFR calc non Af Amer: 57 mL/min/{1.73_m2} — ABNORMAL LOW (ref >59)
Globulin, Total: 2.8 g/dL (ref 1.5–4.5)
Glucose: 232 mg/dL — ABNORMAL HIGH (ref 65–99)
Hematocrit: 35.3 % (ref 34.0–46.6)
Hemoglobin: 11.8 g/dL (ref 11.1–15.9)
Immature Grans (Abs): 0 10*3/uL (ref 0.0–0.1)
Immature Granulocytes: 0 %
Lymphocytes Absolute: 2 10*3/uL (ref 0.7–3.1)
Lymphs: 25 %
MCH: 30.7 pg (ref 26.6–33.0)
MCHC: 33.4 g/dL (ref 31.5–35.7)
MCV: 92 fL (ref 79–97)
Monocytes Absolute: 0.5 10*3/uL (ref 0.1–0.9)
Monocytes: 6 %
Neutrophils Absolute: 5.3 10*3/uL (ref 1.4–7.0)
Neutrophils: 66 %
Platelets: 330 10*3/uL (ref 150–450)
Potassium: 4.1 mmol/L (ref 3.5–5.2)
RBC: 3.84 x10E6/uL (ref 3.77–5.28)
RDW: 13.3 % (ref 11.7–15.4)
Retic Ct Pct: 1.1 % (ref 0.6–2.6)
Sodium: 140 mmol/L (ref 134–144)
Total Protein: 7.4 g/dL (ref 6.0–8.5)
Vit D, 25-Hydroxy: 28.8 ng/mL — ABNORMAL LOW (ref 30.0–100.0)
WBC: 8.1 10*3/uL (ref 3.4–10.8)

## 2018-03-16 LAB — SICKLE CELL SCREEN: Sickle Cell Screen: NEGATIVE

## 2018-03-17 ENCOUNTER — Other Ambulatory Visit (HOSPITAL_COMMUNITY): Payer: Self-pay

## 2018-03-17 NOTE — Progress Notes (Signed)
Paramedicine Encounter    Patient ID: Colleen BURDETT, female    DOB: [birth year], 68 y.o.   MRN: [id number]   Patient Care Team: Jeanella Anton, NP as PCP - General (Nurse Practitioner) Jorge Ny, LCSW as Social Worker (Licensed Clinical Social Worker)  Patient Active Problem List   Diagnosis Date Noted  . Right foot infection 10/22/2017  . Cellulitis in diabetic foot ([place]) 10/22/2017  . Hyperglycemia 10/22/2017  . Lumbar radiculopathy 09/18/2017  . Degenerative spondylolisthesis 09/18/2017  . Atrial fibrillation ([place]) 02/11/2017  . Paroxysmal A-fib ([place])   . Biventricular automatic implantable cardioverter defibrillator in situ   . Sepsis ([place]) 04/20/2016  . UTI (urinary tract infection) 04/20/2016  . Dyspnea 07/19/2015  . Interstitial lung disease ([place]) 11/20/2014  . SIRS (systemic inflammatory response syndrome) ([place]) 02/03/2014  . IDDM (insulin dependent diabetes mellitus) ([place]) 02/03/2014  . Tachycardia 06/14/2013  . Chronic systolic CHF (congestive heart failure) ([place]) 09/29/2012  . Hypoxia 03/06/2012  . Hypertensive heart disease 03/06/2012  . Nonischemic cardiomyopathy ([place]) 03/06/2012  . Tobacco abuse 03/06/2012  . Type 2 diabetes mellitus ([place]) 03/06/2012  . Hypertension 03/06/2012  . CAD (coronary artery disease), native coronary artery 03/06/2012  . Hypokalemia 03/06/2012  . Acute bronchitis 02/01/2009  . Sleep apnea 02/01/2009  . Chest pain 02/01/2009    Current Outpatient Medications:  .  acetaminophen (TYLENOL) 500 MG tablet, Take 1 tablet (500 mg total) by mouth every 6 (six) hours as needed., Disp: 30 tablet, Rfl: 0 .  albuterol (PROVENTIL HFA;VENTOLIN HFA) 108 (90 Base) MCG/ACT inhaler, Inhale 2 puffs into the lungs every 6 (six) hours as needed for wheezing or shortness of breath., Disp: 18 g, Rfl: 0 .  apixaban (ELIQUIS) 5 MG TABS tablet, Take 1 tablet (5 mg total) by mouth 2 (two) times daily., Disp: 60 tablet, Rfl: 3 .  atorvastatin (LIPITOR) 40  MG tablet, Take 1 tablet (40 mg total) by mouth daily., Disp: 30 tablet, Rfl: 3 .  carvedilol (COREG) 25 MG tablet, Take 1 tablet (25 mg total) by mouth 2 (two) times daily with a meal., Disp: 60 tablet, Rfl: 11 .  ENTRESTO 97-103 MG, TAKE 1 TABLET BY MOUTH TWICE DAILY, Disp: 60 tablet, Rfl: 6 .  furosemide (LASIX) 40 MG tablet, Take 1 tablet (40 mg total) by mouth 2 (two) times daily., Disp: 180 tablet, Rfl: 3 .  gabapentin (NEURONTIN) 100 MG capsule, TK 1 C PO TID, Disp: , Rfl:  .  metFORMIN (GLUCOPHAGE) 1000 MG tablet, TAKE 1 TABLET(1000 MG) BY MOUTH TWICE DAILY WITH A MEAL (Patient taking differently: Take 1,000 mg by mouth 2 (two) times daily. ), Disp: 60 tablet, Rfl: 0 .  Oxycodone HCl 10 MG TABS, Take 10 mg by mouth 4 (four) times daily as needed (for pain)., Disp: , Rfl:  .  Potassium Chloride ER 20 MEQ TBCR, Take 40 mEq by mouth daily., Disp: 60 tablet, Rfl: 5 .  spironolactone (ALDACTONE) 25 MG tablet, TAKE 1 TABLET(25 MG) BY MOUTH DAILY, Disp: 30 tablet, Rfl: 3 .  Tafamidis (VYNDAMAX) 61 MG CAPS, Take 61 mg by mouth daily., Disp: 30 capsule, Rfl: 11 .  Vitamin D, Ergocalciferol, (DRISDOL) 1.25 MG (50000 UT) CAPS capsule, TK 1 C PO WEEKLY, Disp: , Rfl:  .  baclofen (LIORESAL) 10 MG tablet, TK 1 T PO BID PRN, Disp: , Rfl:  .  gabapentin (NEURONTIN) 300 MG capsule, Take 300 mg by mouth 2 (two) times daily. , Disp: , Rfl: 0 .  glucose  blood (FREESTYLE TEST STRIPS) test strip, Use as instructed, Disp: 100 each, Rfl: 0 .  glucose monitoring kit (FREESTYLE) monitoring kit, 1 each by Does not apply route 4 (four) times daily - after meals and at bedtime. 1 month Diabetic Testing Supplies for QAC-QHS accuchecks., Disp: 1 each, Rfl: 1 .  HUMALOG KWIKPEN 100 UNIT/ML KwikPen, INJECT 10 UNITS INTO THE SKIN WITH EACH MEAL, Disp: , Rfl:  .  insulin aspart (NOVOLOG FLEXPEN) 100 UNIT/ML FlexPen, 0-20 Units, Subcutaneous, 3 times daily with meals CBG < 70: implement hypoglycemia protocol CBG 70 - 120: 0  units CBG 121 - 150: 3 units CBG 151 - 200: 4 units CBG 201 - 250: 7 units CBG 251 - 300: 11 units CBG 301 - 350: 15 units CBG 351 - 400: 20 units CBG > 400: call MD, Disp: 15 mL, Rfl: 0 .  Insulin Glargine (LANTUS SOLOSTAR) 100 UNIT/ML Solostar Pen, Inject 50 Units into the skin daily at 10 pm., Disp: 15 mL, Rfl: 0 .  Insulin Pen Needle 32G X 8 MM MISC, Use as directed, Disp: 100 each, Rfl: 0 .  isosorbide-hydrALAZINE (BIDIL) 20-37.5 MG tablet, Take 1 tablet by mouth 3 (three) times daily., Disp: 90 tablet, Rfl: 6 .  Lancets (FREESTYLE) lancets, Use as instructed, Disp: 100 each, Rfl: 0 .  metroNIDAZOLE (FLAGYL) 500 MG tablet, TK 2 TS PO D, Disp: , Rfl:  .  naproxen sodium (ALEVE) 220 MG tablet, Take 220 mg by mouth daily as needed (for pain or headache). , Disp: , Rfl:  .  polyethylene glycol (MIRALAX / GLYCOLAX) packet, Take 17 g by mouth daily as needed for mild constipation., Disp: 14 each, Rfl: 0 .  prednisoLONE acetate (PRED FORTE) 1 % ophthalmic suspension, Place 1 drop into the right eye 4 (four) times daily for 7 days., Disp: 10 mL, Rfl: 0 .  valACYclovir (VALTREX) 1000 MG tablet, Take 1 tablet (1,000 mg total) by mouth 3 (three) times daily., Disp: 21 tablet, Rfl: 0 Allergies  Allergen Reactions  . Morphine And Related Nausea Only    Severe nausea  . Penicillins Other (See Comments)    Pt states she has had a pain in her leg since a penicillin injection 2 months ago (reported 08/31/17).  Has tolerated amoxicillin oral.  Has patient had a PCN reaction causing immediate rash, facial/tongue/throat swelling, SOB or lightheadedness with hypotension: No Has patient had a PCN reaction causing severe rash involving mucus membranes or skin necrosis: No Has patient had a PCN reaction that required hospitalization: No Has patient had a PCN reaction occurring within the last 10 years: Yes If a      Social History   Socioeconomic History  . Marital status: Single    Spouse name: Not on file   . Number of children: 4  . Years of education: 1  . Highest education level: Not on file  Occupational History  . Not on file  Social Needs  . Financial resource strain: Not hard at all  . Food insecurity:    Worry: Never true    Inability: Never true  . Transportation needs:    Medical: No    Non-medical: No  Tobacco Use  . Smoking status: Former Smoker    Packs/day: 1.00    Years: 40.00    Pack years: 40.00    Types: Cigarettes    Start date: 06/23/1972    Last attempt to quit: 03/26/2012    Years since quitting: 5.9  . Smokeless  tobacco: Never Used  Substance and Sexual Activity  . Alcohol use: No  . Drug use: No  . Sexual activity: Yes  Lifestyle  . Physical activity:    Days per week: Not on file    Minutes per session: Not on file  . Stress: Not on file  Relationships  . Social connections:    Talks on phone: Not on file    Gets together: Not on file    Attends religious service: Not on file    Active member of club or organization: Not on file    Attends meetings of clubs or organizations: Not on file    Relationship status: Not on file  . Intimate partner violence:    Fear of current or ex partner: Not on file    Emotionally abused: Not on file    Physically abused: Not on file    Forced sexual activity: Not on file  Other Topics Concern  . Not on file  Social History Narrative  . Not on file    Physical Exam Cardiovascular:     Rate and Rhythm: Normal rate and regular rhythm.     Pulses: Normal pulses.  Pulmonary:     Effort: Pulmonary effort is normal.     Breath sounds: Normal breath sounds.  Musculoskeletal: Normal range of motion.     Right lower leg: No edema.     Left lower leg: No edema.  Skin:    General: Skin is warm and dry.     Capillary Refill: Capillary refill takes less than 2 seconds.  Neurological:     Mental Status: She is alert and oriented to person, place, and time.  Psychiatric:        Mood and Affect: Mood normal.          Future Appointments  Date Time Provider [place]  03/25/2018  9:40 AM Larey Dresser, MD [facility] None  03/29/2018  8:00 AM Bernarda Caffey, MD [facility] None  06/07/2018 11:05 AM [facility] DEVICE REMOTES [facility]    BP 138/70 (BP Location: Left Arm, Patient Position: Sitting, Cuff Size: Large)   Pulse 90   Resp 16   Wt 216 lb 12.8 oz (98.3 kg)   SpO2 94%   BMI 33.96 kg/m   Weight yesterday- Did not weigh Last visit weight- 222 lb  Ms Guilbert was seen at home today and reported feeling well. She denied chest pain, SOB, headache, dizziness or orthopnea. She stated she has been compliant with her medications over the past two weeks however there were several pills still in her box where there should have been none. She is not weighing daily despite me telling her how important it is to her health. Her medications were verified and her pillboxes were refilled. I will follow up in two weeks.  [name], EMT 03/17/18  ACTION: Home visit completed Next visit planned for 2 weeks

## 2018-03-25 ENCOUNTER — Other Ambulatory Visit: Payer: Self-pay

## 2018-03-25 ENCOUNTER — Encounter (HOSPITAL_COMMUNITY): Payer: Self-pay | Admitting: Cardiology

## 2018-03-25 ENCOUNTER — Other Ambulatory Visit (HOSPITAL_COMMUNITY): Payer: Self-pay

## 2018-03-25 ENCOUNTER — Ambulatory Visit (HOSPITAL_COMMUNITY)
Admission: RE | Admit: 2018-03-25 | Discharge: 2018-03-25 | Disposition: A | Discharge status: Home / Self Care | Payer: Medicare Other | Source: Ambulatory Visit | Attending: Cardiology | Admitting: Cardiology

## 2018-03-25 VITALS — BP 142/78 | HR 85 | Wt 222.0 lb

## 2018-03-25 DIAGNOSIS — I428 Other cardiomyopathies: Secondary | ICD-10-CM | POA: Diagnosis not present

## 2018-03-25 DIAGNOSIS — Z7901 Long term (current) use of anticoagulants: Secondary | ICD-10-CM | POA: Diagnosis not present

## 2018-03-25 DIAGNOSIS — I491 Atrial premature depolarization: Secondary | ICD-10-CM | POA: Insufficient documentation

## 2018-03-25 DIAGNOSIS — I2510 Atherosclerotic heart disease of native coronary artery without angina pectoris: Secondary | ICD-10-CM | POA: Diagnosis not present

## 2018-03-25 DIAGNOSIS — E785 Hyperlipidemia, unspecified: Secondary | ICD-10-CM | POA: Insufficient documentation

## 2018-03-25 DIAGNOSIS — Z794 Long term (current) use of insulin: Secondary | ICD-10-CM | POA: Insufficient documentation

## 2018-03-25 DIAGNOSIS — Z79899 Other long term (current) drug therapy: Secondary | ICD-10-CM | POA: Insufficient documentation

## 2018-03-25 DIAGNOSIS — Z792 Long term (current) use of antibiotics: Secondary | ICD-10-CM | POA: Insufficient documentation

## 2018-03-25 DIAGNOSIS — E854 Organ-limited amyloidosis: Secondary | ICD-10-CM | POA: Insufficient documentation

## 2018-03-25 DIAGNOSIS — E1140 Type 2 diabetes mellitus with diabetic neuropathy, unspecified: Secondary | ICD-10-CM | POA: Diagnosis not present

## 2018-03-25 DIAGNOSIS — I447 Left bundle-branch block, unspecified: Secondary | ICD-10-CM | POA: Insufficient documentation

## 2018-03-25 DIAGNOSIS — I5022 Chronic systolic (congestive) heart failure: Secondary | ICD-10-CM

## 2018-03-25 DIAGNOSIS — I43 Cardiomyopathy in diseases classified elsewhere: Secondary | ICD-10-CM | POA: Diagnosis not present

## 2018-03-25 DIAGNOSIS — I480 Paroxysmal atrial fibrillation: Secondary | ICD-10-CM | POA: Diagnosis not present

## 2018-03-25 DIAGNOSIS — I110 Hypertensive heart disease with heart failure: Secondary | ICD-10-CM | POA: Insufficient documentation

## 2018-03-25 DIAGNOSIS — Z87891 Personal history of nicotine dependence: Secondary | ICD-10-CM | POA: Insufficient documentation

## 2018-03-25 LAB — BASIC METABOLIC PANEL
Anion gap: 10 (ref 5–15)
BUN: 27 mg/dL — ABNORMAL HIGH (ref 8–23)
CO2: 29 mmol/L (ref 22–32)
Calcium: 9 mg/dL (ref 8.9–10.3)
Chloride: 102 mmol/L (ref 98–111)
Creatinine, Ser: 1.16 mg/dL — ABNORMAL HIGH (ref 0.44–1.00)
GFR calc Af Amer: 57 mL/min — ABNORMAL LOW (ref >60)
GFR calc non Af Amer: 49 mL/min — ABNORMAL LOW (ref >60)
Glucose, Bld: 175 mg/dL — ABNORMAL HIGH (ref 70–99)
Potassium: 4.2 mmol/L (ref 3.5–5.1)
Sodium: 141 mmol/L (ref 135–145)

## 2018-03-25 NOTE — Progress Notes (Signed)
[name] was seen in the clinic with Dr Aundra Dubin today. She reported feeling generally well. She did not exhibit any signs of fluid retention despite having her weight elevated. Per Dr Aundra Dubin, no medications were changed today. I will follow up at her home next week.

## 2018-03-25 NOTE — Progress Notes (Signed)
Patient ID: [name], female   DOB: [birth year], 68 y.o.   MRN: [id number]   Advanced Heart Failure Clinic Note   PCP: Dr. Alyson Ingles Cardiology: Dr Dora Sims is a 68 y.o. female with history of nonischemic cardiomyopathy.  She was admitted with CHF exacerbation in 02/2012.  EF 20-25% on echo, LHC with nonobstructive CAD.  She was started on cardiac meds and discharged.  In 07/2012, she was admitted again with CHF exacerbation.  She had run out of Lasix.  She was taking her other heart medications as ordered, however.  She was diuresed and discharged. She had a chronic LBBB, and Medtronic CRT-D device was placed in 10/14.  She was admitted in 6/16 with hypertensive emergency and CHF exacerbation.   Also of note, she had PFTs in 9/16 showing restrictive spirometry with low lung volume and DLCO.  She was supposed to get a high resolution CT to evaluate for interstitial lung disease but never had the study.     Admitted March 2018 with urosepsis--> E Coli Bacteremia. Completed antibiotic course. EF was down from previous to 30-35%. Also had atrial fibrillation so she was loaded on amiodarone. Placed on eliquis. Discharge weight was 208 pounds.  She is now off amiodarone.   Admitted to [facility] 1/16 -> 02/13/16 with CP and dizziness. Found to be in Afib. Converted spontaneously overnight with BB and diuresis.   Echo 11/19 showed EF 35-40%.  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   She returns for followup of CHF.  She is followed by paramedicine and has been compliant with all her meds.  She feels good.  Weight is up but she says that she has been eating more.  No palpitations.  No lightheadedness.  No significant exertional dyspnea. No chest pain.  No orthopnea/PND.    Optivol: Fluid index < threshold/stable impedance, rare AF (6 days of AF in 12/19), >99% BiV pacing.     ECG (personally reviewed): NSR, PVC, poor RWP  Labs (2/14):  SPEP negative, UPEP negative, HIV negative Labs (04/14/2017): K 3.8 Creatinine 0.94  Labs (9/19): K 3.3, creatinine 0.86 Labs (1/20): K 4, creatinine 0.87  PMH: 1. HTN 2. Type II diabetes with neuropathy 3. Nonischemic cardiomyopathy: ? Due to HTN versus LBBB CMP.  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  Echo (2/14) with EF 20-25%.  Echo (7/14) with EF 25%, diffuse hypokinesis.  HIV, SPEP, UPEP negative.  Has LBBB. CRT-D 10/2012 (Medtronic).  Echo (4/15) with EF 45-50%, mild diffuse hypokinesis, PA systolic pressure 38 mmHg.  Echo (6/16) with EF 40-45%, mild LVH, septal and inferior hypokinesis.   - Echo (3/18): EF 30-35%. Grade 1 DD - Echo (6/18): EF 45-50%, moderate LVH, normal RV size with mildly decreased systolic function.  - Echo (11/19): EF 35-40%, moderate LVH, moderate diastolic dysfunction, normal RV size with mildly decreased systolic function, PASP 43 mmHg.  4. Chronic LBBB 5. Right TKR 6. Bilateral THR.  7. Hyperlipidemia 8. ?COPD: Has oxygen for use with exertion.  - PFTs (9/16) with FEV1 77%, FVC 76%, ratio 101%, TLC 63%, DLCO 41% => moderate restrictive deficit  9. Atrial fibrillation: Paroxysmal.  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   SH: Prior smoker, quit 2/14.  Never drank ETOH.  No drugs. Lives with son.   FH: Mother with "heart trouble."   Review of systems complete  and found to be negative unless listed in HPI.    Current Outpatient Medications  Medication Sig Dispense Refill  . acetaminophen (TYLENOL) 500 MG tablet Take 1 tablet (500 mg total) by mouth every 6 (six) hours as needed. 30 tablet 0  . albuterol (PROVENTIL HFA;VENTOLIN HFA) 108 (90 Base) MCG/ACT inhaler Inhale 2 puffs into the lungs every 6 (six) hours as needed for wheezing or shortness of breath. 18 g 0  . apixaban (ELIQUIS) 5 MG TABS tablet Take 1 tablet (5 mg total) by mouth 2 (two) times daily. 60  tablet 3  . atorvastatin (LIPITOR) 40 MG tablet Take 1 tablet (40 mg total) by mouth daily. 30 tablet 3  . carvedilol (COREG) 25 MG tablet Take 1 tablet (25 mg total) by mouth 2 (two) times daily with a meal. 60 tablet 11  . ENTRESTO 97-103 MG TAKE 1 TABLET BY MOUTH TWICE DAILY 60 tablet 6  . furosemide (LASIX) 40 MG tablet Take 1 tablet (40 mg total) by mouth 2 (two) times daily. 180 tablet 3  . gabapentin (NEURONTIN) 100 MG capsule TK 1 C PO TID    . glucose blood (FREESTYLE TEST STRIPS) test strip Use as instructed 100 each 0  . glucose monitoring kit (FREESTYLE) monitoring kit 1 each by Does not apply route 4 (four) times daily - after meals and at bedtime. 1 month Diabetic Testing Supplies for QAC-QHS accuchecks. 1 each 1  . HUMALOG KWIKPEN 100 UNIT/ML KwikPen INJECT 10 UNITS INTO THE SKIN WITH EACH MEAL    . insulin aspart (NOVOLOG FLEXPEN) 100 UNIT/ML FlexPen 0-20 Units, Subcutaneous, 3 times daily with meals CBG < 70: implement hypoglycemia protocol CBG 70 - 120: 0 units CBG 121 - 150: 3 units CBG 151 - 200: 4 units CBG 201 - 250: 7 units CBG 251 - 300: 11 units CBG 301 - 350: 15 units CBG 351 - 400: 20 units CBG > 400: call MD 15 mL 0  . Insulin Glargine (LANTUS SOLOSTAR) 100 UNIT/ML Solostar Pen Inject 50 Units into the skin daily at 10 pm. 15 mL 0  . Insulin Pen Needle 32G X 8 MM MISC Use as directed 100 each 0  . isosorbide-hydrALAZINE (BIDIL) 20-37.5 MG tablet Take 1 tablet by mouth 3 (three) times daily. 90 tablet 6  . Lancets (FREESTYLE) lancets Use as instructed 100 each 0  . metFORMIN (GLUCOPHAGE) 1000 MG tablet TAKE 1 TABLET(1000 MG) BY MOUTH TWICE DAILY WITH A MEAL (Patient taking differently: Take 1,000 mg by mouth 2 (two) times daily. ) 60 tablet 0  . naproxen sodium (ALEVE) 220 MG tablet Take 220 mg by mouth daily as needed (for pain or headache).     . Potassium Chloride ER 20 MEQ TBCR Take 40 mEq by mouth daily. 60 tablet 5  . spironolactone (ALDACTONE) 25 MG tablet  TAKE 1 TABLET(25 MG) BY MOUTH DAILY 30 tablet 3  . Tafamidis (VYNDAMAX) 61 MG CAPS Take 61 mg by mouth daily. 30 capsule 11  . Vitamin D, Ergocalciferol, (DRISDOL) 1.25 MG (50000 UT) CAPS capsule TK 1 C PO WEEKLY    . baclofen (LIORESAL) 10 MG tablet TK 1 T PO BID PRN    . metroNIDAZOLE (FLAGYL) 500 MG tablet TK 2 TS PO D    . Oxycodone HCl 10 MG TABS Take 10 mg by mouth 4 (four) times daily as needed (for pain).    . polyethylene glycol (MIRALAX / GLYCOLAX) packet Take 17 g by mouth daily as needed  for mild constipation. (Patient not taking: Reported on 03/25/2018) 14 each 0  . valACYclovir (VALTREX) 1000 MG tablet Take 1 tablet (1,000 mg total) by mouth 3 (three) times daily. (Patient not taking: Reported on 03/25/2018) 21 tablet 0   No current facility-administered medications for this encounter.    Vitals:   03/25/18 0926  BP: (!) 142/78  Pulse: 85  SpO2: 97%  Weight: 100.7 kg (222 lb)    Wt Readings from Last 3 Encounters:  03/25/18 100.7 kg (222 lb)  03/17/18 98.3 kg (216 lb 12.8 oz)  03/04/18 101 kg (222 lb 9.6 oz)    Physical exam General: NAD Neck: No JVD, no thyromegaly or thyroid nodule.  Lungs: Clear to auscultation bilaterally with normal respiratory effort. CV: Nondisplaced PMI.  Heart regular S1/S2, no S3/S4, no murmur.  No peripheral edema.  No carotid bruit.  Normal pedal pulses.  Abdomen: Soft, nontender, no hepatosplenomegaly, no distention.  Skin: Intact without lesions or rashes.  Neurologic: Alert and oriented x 3.  Psych: Normal affect. Extremities: No clubbing or cyanosis.  HEENT: Normal.   Assessment/Plan: 1. Chronic systolic CHF: Nonischemic cardiomyopathy, thought to be related to HTN. S/P CRT-D (Medtronic). Echo 11/19 showed EF 35-40%, moderate LVH. She is not volume overloaded by Optivol or exam.  NYHA class II.  - Continue Coreg 25 mg BID.  - Continue Entresto 97/103 mg BID - Continue bidil 2 tabs TID - Continue spiro 25 mg daily.  - Continue  Lasix 40 mg bid.  BMET today.  - Continue paramedicine.   2. Hyperlipidemia: Continue statin.   3. HTN: BP mildly elevated today but generally well-controlled.  4. PAF: Rare atrial fibrillation on device interrogation.   - Continue Eliquis for anticoagulation.  5. 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 Genetic testing was negative (wild type).  - Continue tafamidis.   Followup in 3 months.    [name], MD  03/25/2018

## 2018-03-25 NOTE — Patient Instructions (Signed)
No medication changes!   Labs today We will only contact you if something comes back abnormal or we need to make some changes. Otherwise no news is good news!   Your physician recommends that you schedule a follow-up appointment in: 3 months with Dr.McLean   Do the following things EVERYDAY: 1) Weigh yourself in the morning before breakfast. Write it down and keep it in a log. 2) Take your medicines as prescribed 3) Eat low salt foods-Limit salt (sodium) to 2000 mg per day.  4) Stay as active as you can everyday 5) Limit all fluids for the day to less than 2 liters   

## 2018-03-27 NOTE — Progress Notes (Signed)
[facility] Note  03/29/2018     CHIEF COMPLAINT Patient presents for Retina Follow Up   HISTORY OF PRESENT ILLNESS: [name] is a 68 y.o. female who presents to the clinic today for:   HPI    Retina Follow Up    Patient presents with  Other (BRAO OD).  In right eye.  Severity is severe.  Duration of 2 weeks.  Since onset it is stable.  I, the attending physician,  performed the HPI with the patient and updated documentation appropriately.          Comments    S/P PRP OD- patient states vision the same OU. Used PF X 7 days, then discontinued.        Last edited by Bernarda Caffey, MD on 03/29/2018  8:21 AM. (History)    pt states she did not have any problems with laser procedure at last visit  Referring physician: Jeanella Anton, NP [address] [place], [place] [address]  HISTORICAL INFORMATION:   Selected notes from the MEDICAL RECORD NUMBER Referred by Dr. Thurston Hole for concern of BRVO OD / ERM OD LEE: 02.10.20 (S. Bernstorf) [BCVA: OD: 20/25- OS: 20/25] Ocular Hx-DES OU, narrow angles PMH-DM (metformin, novolin, lantus), HTN, HLD, heart disease    CURRENT MEDICATIONS: No current outpatient medications on file. (Ophthalmic Drugs)   No current facility-administered medications for this visit.  (Ophthalmic Drugs)   Current Outpatient Medications (Other)  Medication Sig  . acetaminophen (TYLENOL) 500 MG tablet Take 1 tablet (500 mg total) by mouth every 6 (six) hours as needed.  Marland Kitchen albuterol (PROVENTIL HFA;VENTOLIN HFA) 108 (90 Base) MCG/ACT inhaler Inhale 2 puffs into the lungs every 6 (six) hours as needed for wheezing or shortness of breath.  Marland Kitchen apixaban (ELIQUIS) 5 MG TABS tablet Take 1 tablet (5 mg total) by mouth 2 (two) times daily.  Marland Kitchen atorvastatin (LIPITOR) 40 MG tablet Take 1 tablet (40 mg total) by mouth daily.  . baclofen (LIORESAL) 10 MG tablet TK 1 T PO BID PRN  . carvedilol (COREG) 25 MG tablet Take  1 tablet (25 mg total) by mouth 2 (two) times daily with a meal.  . ENTRESTO 97-103 MG TAKE 1 TABLET BY MOUTH TWICE DAILY  . furosemide (LASIX) 40 MG tablet Take 1 tablet (40 mg total) by mouth 2 (two) times daily.  Marland Kitchen gabapentin (NEURONTIN) 100 MG capsule TK 1 C PO TID  . glucose blood (FREESTYLE TEST STRIPS) test strip Use as instructed  . glucose monitoring kit (FREESTYLE) monitoring kit 1 each by Does not apply route 4 (four) times daily - after meals and at bedtime. 1 month Diabetic Testing Supplies for QAC-QHS accuchecks.  Marland Kitchen HUMALOG KWIKPEN 100 UNIT/ML KwikPen INJECT 10 UNITS INTO THE SKIN WITH EACH MEAL  . insulin aspart (NOVOLOG FLEXPEN) 100 UNIT/ML FlexPen 0-20 Units, Subcutaneous, 3 times daily with meals CBG < 70: implement hypoglycemia protocol CBG 70 - 120: 0 units CBG 121 - 150: 3 units CBG 151 - 200: 4 units CBG 201 - 250: 7 units CBG 251 - 300: 11 units CBG 301 - 350: 15 units CBG 351 - 400: 20 units CBG > 400: call MD  . Insulin Glargine (LANTUS SOLOSTAR) 100 UNIT/ML Solostar Pen Inject 50 Units into the skin daily at 10 pm.  . Insulin Pen Needle 32G X 8 MM MISC Use as directed  . isosorbide-hydrALAZINE (BIDIL) 20-37.5 MG tablet Take 1 tablet by  mouth 3 (three) times daily.  . Lancets (FREESTYLE) lancets Use as instructed  . metFORMIN (GLUCOPHAGE) 1000 MG tablet TAKE 1 TABLET(1000 MG) BY MOUTH TWICE DAILY WITH A MEAL (Patient taking differently: Take 1,000 mg by mouth 2 (two) times daily. )  . metroNIDAZOLE (FLAGYL) 500 MG tablet TK 2 TS PO D  . naproxen sodium (ALEVE) 220 MG tablet Take 220 mg by mouth daily as needed (for pain or headache).   . Oxycodone HCl 10 MG TABS Take 10 mg by mouth 4 (four) times daily as needed (for pain).  . Potassium Chloride ER 20 MEQ TBCR Take 40 mEq by mouth daily.  Marland Kitchen spironolactone (ALDACTONE) 25 MG tablet TAKE 1 TABLET(25 MG) BY MOUTH DAILY  . Tafamidis (VYNDAMAX) 61 MG CAPS Take 61 mg by mouth daily.  . Vitamin D, Ergocalciferol,  (DRISDOL) 1.25 MG (50000 UT) CAPS capsule TK 1 C PO WEEKLY  . polyethylene glycol (MIRALAX / GLYCOLAX) packet Take 17 g by mouth daily as needed for mild constipation. (Patient not taking: Reported on 03/25/2018)  . valACYclovir (VALTREX) 1000 MG tablet Take 1 tablet (1,000 mg total) by mouth 3 (three) times daily. (Patient not taking: Reported on 03/25/2018)   No current facility-administered medications for this visit.  (Other)      REVIEW OF SYSTEMS: ROS    Positive for: Endocrine, Cardiovascular, Eyes, Respiratory   Negative for: Constitutional, Gastrointestinal, Neurological, Skin, Genitourinary, Musculoskeletal, HENT, Psychiatric, Allergic/Imm, Heme/Lymph   Last edited by Roselee Nova D on 03/29/2018  7:51 AM. (History)       ALLERGIES Allergies  Allergen Reactions  . Morphine And Related Nausea Only    Severe nausea  . Penicillins Other (See Comments)    Pt states she has had a pain in her leg since a penicillin injection 2 months ago (reported 08/31/17).  Has tolerated amoxicillin oral.  Has patient had a PCN reaction causing immediate rash, facial/tongue/throat swelling, SOB or lightheadedness with hypotension: No Has patient had a PCN reaction causing severe rash involving mucus membranes or skin necrosis: No Has patient had a PCN reaction that required hospitalization: No Has patient had a PCN reaction occurring within the last 10 years: Yes If a    PAST MEDICAL HISTORY Past Medical History:  Diagnosis Date  . Anemia    a. Noted on 07/2012 labs, instructed to f/u PCP.  Marland Kitchen Arthritis    "joints" (11/18/2012)  . CAD (coronary artery disease), native coronary artery    a. Nonobstructive by cath 02/2012 (done because of low EF).  . Chronic bronchitis ([place])    "~ every other year" (11/18/2012)  . Chronic combined systolic and diastolic CHF (congestive heart failure) ([place])    a. 03/05/12 echo:  LVEF 20-25%, moderate LVH , inferior and basal to mid septal akinesis, anterior  moderate to severe hypokinesis and grade 2 diastolic dysfunction. b. EF 07/2012: EF still 25% (unclear medication compliance).  . Chronic lower back pain   . Headache(784.0)    "often; maybe not daily" (11/18/2012)  . High cholesterol   . History of noncompliance with medical treatment   . Hypertension   . LBBB (left bundle branch block)   . Orthopnea   . Tobacco abuse   . Type II diabetes mellitus ([place])    Past Surgical History:  Procedure Laterality Date  . BI-VENTRICULAR IMPLANTABLE CARDIOVERTER DEFIBRILLATOR N/A 11/18/2012   Procedure: BI-VENTRICULAR IMPLANTABLE CARDIOVERTER DEFIBRILLATOR  (CRT-D);  Surgeon: Evans Lance, MD;  Location: [facility] CATH LAB;  Service: Cardiovascular;  Laterality: N/A;  . BI-VENTRICULAR IMPLANTABLE CARDIOVERTER DEFIBRILLATOR  (CRT-D)  11/18/2012  . CARDIAC CATHETERIZATION  03/04/12   nonobstructive CAD, elevated LVEDP and tortuous vessels suggestive of long-standing hypertension  . COLONOSCOPY WITH PROPOFOL N/A 12/12/2016   Procedure: COLONOSCOPY WITH PROPOFOL;  Surgeon: Carol Ada, MD;  Location: [facility] ENDOSCOPY;  Service: Endoscopy;  Laterality: N/A;  . JOINT REPLACEMENT     Bilateral hip and right knee  . LEFT HEART CATH N/A 03/05/2012   Procedure: LEFT HEART CATH;  Surgeon: Larey Dresser, MD;  Location: [facility] CATH LAB;  Service: Cardiovascular;  Laterality: N/A;    FAMILY HISTORY Family History  Problem Relation Age of Onset  . Heart disease Neg Hx     SOCIAL HISTORY Social History   Tobacco Use  . Smoking status: Former Smoker    Packs/day: 1.00    Years: 40.00    Pack years: 40.00    Types: Cigarettes    Start date: 06/23/1972    Last attempt to quit: 03/26/2012    Years since quitting: 6.0  . Smokeless tobacco: Never Used  Substance Use Topics  . Alcohol use: No  . Drug use: No         OPHTHALMIC EXAM:  Base Eye Exam    Visual Acuity (Snellen - Linear)      Right Left   Dist [place] 20/150 20/150   Dist ph [place] 20/60 20/30 -2        Tonometry (Tonopen, 8:00 AM)      Right Left   Pressure 13 14       Pupils      Dark Light Shape React APD   Right 4 3 Round Brisk None   Left 4 3 Round Brisk None       Visual Fields (Counting fingers)      Left Right    Full Full       Extraocular Movement      Right Left    Full, Ortho Full, Ortho       Neuro/Psych    Oriented x3:  Yes   Mood/Affect:  Normal       Dilation    Both eyes:  1.0% Mydriacyl, 2.5% Phenylephrine @ 8:01 AM        Slit Lamp and Fundus Exam    Slit Lamp Exam      Right Left   Lids/Lashes Dermatochalasis - upper lid Dermatochalasis - upper lid   Conjunctiva/Sclera nasal Pinguecula, Melanosis nasal/temporal Pinguecula, Melanosis   Cornea 2+ Punctate epithelial erosions 1-2+ Punctate epithelial erosions   Anterior Chamber deep and clear deep and clear   Iris Round and dilated, No NVI Round and dilated, No NVI   Lens 2-3+ Nuclear sclerosis, 2-3+ Cortical cataract 2-3+ Nuclear sclerosis, 2-3+ Cortical cataract   Vitreous Vitreous syneresis, no VH Vitreous syneresis       Fundus Exam      Right Left   Disc +cupping, mild tilt, temporal PPA sharp rim, +cupping, temporal PPA   C/D Ratio 0.6 0.8   Macula Blunted foveal reflex, 3-4+ERM, no heme Good foveal reflex, rare MA   Vessels Vascular attenuation, veins dilated/tortuous, AV crossing changes, white, sclerotic arteriole / BRAO at 1200 Vascular attenuation, Tortuous   Periphery Attached, fibrotic NV at 1200 ?seafan, pigmented cystoid degeneration at 0600 - good early segmental PRP laser changes in area of BRAO and NV Attached           Refraction    Manifest Refraction  Sphere Cylinder Axis Dist VA   Right +1.50 sph   20/40   Left +1.50 +0.50 050 20/30          IMAGING AND PROCEDURES  Imaging and Procedures for '@TODAY' @  OCT, Retina - OU - Both Eyes       Right Eye Quality was good. Central Foveal Thickness: 409. Progression has been stable. Findings include abnormal foveal  contour, epiretinal membrane, no IRF, no SRF, preretinal fibrosis, macular pucker (Thick ERM - no change from prior).   Left Eye Quality was good. Central Foveal Thickness: 234. Progression has been stable. Findings include normal foveal contour, no SRF, no IRF, vitreomacular adhesion .   Notes *Images captured and stored on drive  Diagnosis / Impression:  OD: thick ERM with macular pucker -- stable from prior OS: NFP, no IRF/SRF, mild VMA   Clinical management:  See below  Abbreviations: NFP - Normal foveal profile. CME - cystoid macular edema. PED - pigment epithelial detachment. IRF - intraretinal fluid. SRF - subretinal fluid. EZ - ellipsoid zone. ERM - epiretinal membrane. ORA - outer retinal atrophy. ORT - outer retinal tubulation. SRHM - subretinal hyper-reflective material                 ASSESSMENT/PLAN:    ICD-10-CM   1. Proliferative retinopathy of right eye H35.21   2. BRAO (branch retinal artery occlusion), right H34.231   3. Epiretinal membrane (ERM) of right eye H35.371   4. Retinal edema H35.81 OCT, Retina - OU - Both Eyes  5. Moderate nonproliferative diabetic retinopathy of both eyes without macular edema associated with type 2 diabetes mellitus ([place]) E36.6294   6. Essential hypertension I10   7. Hypertensive retinopathy of both eyes H35.033   8. Combined forms of age-related cataract of both eyes H25.813     1,2. Proliferative retinopathy and BRAO OD - sclerotic superior arteriole with distal fibrotic NV (?sea fan) and overlying preretinal and vitreous heme - etiology could also be DM2 vs sickle cell retinopathy -- sickle cell screen negative - discussed findings, prognosis and treatment options - recommended segmental PRP OD to areas of nonperfusion ([place]) - s/p segmental PRP OD (2.17.2020) - good ealy laser changes in place - completed PF QID OD x7 days  3,4. Epiretinal membrane, OD  - The natural history, anatomy, potential for loss of  vision, and treatment options including vitrectomy techniques and the complications of endophthalmitis, retinal detachment, vitreous hemorrhage, cataract progression and permanent vision loss discussed with the patient.  - fairly thick ERM overlying retina with pucker and distorted foveal profile  - discussed finding and prognosis and treatment options in detail  - recommend PPV with ERM peel -- recommend cataract surgery first to improve view for PPV -- will refer back to [facility] for consult  - f/u 6 weeks  5. Moderate nonproliferative diabetic retinopathy w/o DME, OS  - The incidence, risk factors for progression, natural history and treatment options for diabetic retinopathy were discussed with patient.    - The need for close monitoring of blood glucose, blood pressure, and serum lipids, avoiding cigarette or any type of tobacco, and the need for long term follow up was also discussed with patient.  - exam shows scattered MA OS, no NV  - FA shows no NV OS  - OCT without diabetic macular edema, OU  - f/u in 2-3 mos  6,7. Hypertensive retinopathy OU  - discussed importance of tight BP control  - monitor  8.  Mixed form age related cataract OU  - The symptoms of cataract, surgical options, and treatments and risks were discussed with patient.  - discussed diagnosis and progression  - recommend CEIOL OD to improve view for PPV w/ ERM peel OD  - will refer back to [facility] for cat eval    Ophthalmic Meds Ordered this visit:  No orders of the defined types were placed in this encounter.      Return in about 6 weeks (around 05/10/2018) for ERM, Dilated Exam, OCT.  There are no Patient Instructions on file for this visit.   Explained the diagnoses, plan, and follow up with the patient and they expressed understanding.  Patient expressed understanding of the importance of proper follow up care.   This document serves as a record of services personally performed by Gardiner Sleeper, MD, PhD. It was created on their behalf by Ernest Mallick, OA, an ophthalmic assistant. The creation of this record is the provider's dictation and/or activities during the visit.    Electronically signed by: Ernest Mallick, OA  02.29.2020 8:44 AM    Gardiner Sleeper, M.D., Ph.D. Diseases & Surgery of the Retina and Vitreous [facility]  I have reviewed the above documentation for accuracy and completeness, and I agree with the above. Gardiner Sleeper, M.D., Ph.D. 03/29/18 8:48 AM    Abbreviations: M myopia (nearsighted); A astigmatism; H hyperopia (farsighted); P presbyopia; Mrx spectacle prescription;  CTL contact lenses; OD right eye; OS left eye; OU both eyes  XT exotropia; ET esotropia; PEK punctate epithelial keratitis; PEE punctate epithelial erosions; DES dry eye syndrome; MGD meibomian gland dysfunction; ATs artificial tears; PFAT's preservative free artificial tears; [place] nuclear sclerotic cataract; PSC posterior subcapsular cataract; ERM epi-retinal membrane; PVD posterior vitreous detachment; RD retinal detachment; DM diabetes mellitus; DR diabetic retinopathy; NPDR non-proliferative diabetic retinopathy; PDR proliferative diabetic retinopathy; CSME clinically significant macular edema; DME diabetic macular edema; dbh dot blot hemorrhages; CWS cotton wool spot; POAG primary open angle glaucoma; C/D cup-to-disc ratio; HVF humphrey visual field; GVF goldmann visual field; OCT optical coherence tomography; IOP intraocular pressure; BRVO Branch retinal vein occlusion; CRVO central retinal vein occlusion; CRAO central retinal artery occlusion; BRAO branch retinal artery occlusion; RT retinal tear; SB scleral buckle; PPV pars plana vitrectomy; VH Vitreous hemorrhage; PRP panretinal laser photocoagulation; IVK intravitreal kenalog; VMT vitreomacular traction; MH Macular hole;  NVD neovascularization of the disc; NVE neovascularization elsewhere; AREDS age related eye  disease study; ARMD age related macular degeneration; POAG primary open angle glaucoma; EBMD epithelial/anterior basement membrane dystrophy; ACIOL anterior chamber intraocular lens; IOL intraocular lens; PCIOL posterior chamber intraocular lens; Phaco/IOL phacoemulsification with intraocular lens placement; [place] photorefractive keratectomy; LASIK laser assisted in situ keratomileusis; HTN hypertension; DM diabetes mellitus; COPD chronic obstructive pulmonary disease

## 2018-03-29 ENCOUNTER — Encounter (INDEPENDENT_AMBULATORY_CARE_PROVIDER_SITE_OTHER): Payer: Self-pay | Admitting: Ophthalmology

## 2018-03-29 ENCOUNTER — Ambulatory Visit (INDEPENDENT_AMBULATORY_CARE_PROVIDER_SITE_OTHER): Payer: Medicare Other | Admitting: Ophthalmology

## 2018-03-29 DIAGNOSIS — I10 Essential (primary) hypertension: Secondary | ICD-10-CM

## 2018-03-29 DIAGNOSIS — H34231 Retinal artery branch occlusion, right eye: Secondary | ICD-10-CM | POA: Diagnosis not present

## 2018-03-29 DIAGNOSIS — H3581 Retinal edema: Secondary | ICD-10-CM

## 2018-03-29 DIAGNOSIS — H25813 Combined forms of age-related cataract, bilateral: Secondary | ICD-10-CM

## 2018-03-29 DIAGNOSIS — H3521 Other non-diabetic proliferative retinopathy, right eye: Secondary | ICD-10-CM | POA: Diagnosis not present

## 2018-03-29 DIAGNOSIS — E113393 Type 2 diabetes mellitus with moderate nonproliferative diabetic retinopathy without macular edema, bilateral: Secondary | ICD-10-CM

## 2018-03-29 DIAGNOSIS — H35371 Puckering of macula, right eye: Secondary | ICD-10-CM

## 2018-03-29 DIAGNOSIS — H35033 Hypertensive retinopathy, bilateral: Secondary | ICD-10-CM

## 2018-03-30 ENCOUNTER — Telehealth (HOSPITAL_COMMUNITY): Payer: Self-pay

## 2018-03-30 NOTE — Telephone Encounter (Signed)
I called Ms Russman to schedule an appointment. She stated she was out running errands today and asked if I could com later in the week. We agreed to meet on Thursday at 11:30.

## 2018-04-01 ENCOUNTER — Other Ambulatory Visit (HOSPITAL_COMMUNITY): Payer: Self-pay | Admitting: Student

## 2018-04-01 ENCOUNTER — Other Ambulatory Visit (HOSPITAL_COMMUNITY): Payer: Self-pay

## 2018-04-01 NOTE — Progress Notes (Signed)
Paramedicine Encounter    Patient ID: [name], female    DOB: [birth year], 68 y.o.   MRN: [id number]   Patient Care Team: Jeanella Anton, NP as PCP - General (Nurse Practitioner) Jorge Ny, LCSW as Social Worker (Licensed Clinical Social Worker)  Patient Active Problem List   Diagnosis Date Noted  . Right foot infection 10/22/2017  . Cellulitis in diabetic foot ([place]) 10/22/2017  . Hyperglycemia 10/22/2017  . Lumbar radiculopathy 09/18/2017  . Degenerative spondylolisthesis 09/18/2017  . Atrial fibrillation ([place]) 02/11/2017  . Paroxysmal A-fib ([place])   . Biventricular automatic implantable cardioverter defibrillator in situ   . Sepsis ([place]) 04/20/2016  . UTI (urinary tract infection) 04/20/2016  . Dyspnea 07/19/2015  . Interstitial lung disease ([place]) 11/20/2014  . SIRS (systemic inflammatory response syndrome) ([place]) 02/03/2014  . IDDM (insulin dependent diabetes mellitus) ([place]) 02/03/2014  . Tachycardia 06/14/2013  . Chronic systolic CHF (congestive heart failure) ([place]) 09/29/2012  . Hypoxia 03/06/2012  . Hypertensive heart disease 03/06/2012  . Nonischemic cardiomyopathy ([place]) 03/06/2012  . Tobacco abuse 03/06/2012  . Type 2 diabetes mellitus ([place]) 03/06/2012  . Hypertension 03/06/2012  . CAD (coronary artery disease), native coronary artery 03/06/2012  . Hypokalemia 03/06/2012  . Acute bronchitis 02/01/2009  . Sleep apnea 02/01/2009  . Chest pain 02/01/2009    Current Outpatient Medications:  .  apixaban (ELIQUIS) 5 MG TABS tablet, Take 1 tablet (5 mg total) by mouth 2 (two) times daily., Disp: 60 tablet, Rfl: 3 .  atorvastatin (LIPITOR) 40 MG tablet, Take 1 tablet (40 mg total) by mouth daily., Disp: 30 tablet, Rfl: 3 .  carvedilol (COREG) 25 MG tablet, Take 1 tablet (25 mg total) by mouth 2 (two) times daily with a meal., Disp: 60 tablet, Rfl: 11 .  ENTRESTO 97-103 MG, TAKE 1 TABLET BY MOUTH TWICE DAILY, Disp: 60 tablet, Rfl: 6 .  furosemide (LASIX) 40 MG  tablet, Take 1 tablet (40 mg total) by mouth 2 (two) times daily., Disp: 180 tablet, Rfl: 3 .  gabapentin (NEURONTIN) 100 MG capsule, TK 1 C PO TID, Disp: , Rfl:  .  HUMALOG KWIKPEN 100 UNIT/ML KwikPen, INJECT 10 UNITS INTO THE SKIN WITH EACH MEAL, Disp: , Rfl:  .  insulin aspart (NOVOLOG FLEXPEN) 100 UNIT/ML FlexPen, 0-20 Units, Subcutaneous, 3 times daily with meals CBG < 70: implement hypoglycemia protocol CBG 70 - 120: 0 units CBG 121 - 150: 3 units CBG 151 - 200: 4 units CBG 201 - 250: 7 units CBG 251 - 300: 11 units CBG 301 - 350: 15 units CBG 351 - 400: 20 units CBG > 400: call MD, Disp: 15 mL, Rfl: 0 .  metFORMIN (GLUCOPHAGE) 1000 MG tablet, TAKE 1 TABLET(1000 MG) BY MOUTH TWICE DAILY WITH A MEAL (Patient taking differently: Take 1,000 mg by mouth 2 (two) times daily. ), Disp: 60 tablet, Rfl: 0 .  Oxycodone HCl 10 MG TABS, Take 10 mg by mouth 4 (four) times daily as needed (for pain)., Disp: , Rfl:  .  Potassium Chloride ER 20 MEQ TBCR, Take 40 mEq by mouth daily., Disp: 60 tablet, Rfl: 5 .  spironolactone (ALDACTONE) 25 MG tablet, TAKE 1 TABLET(25 MG) BY MOUTH DAILY, Disp: 30 tablet, Rfl: 3 .  Tafamidis (VYNDAMAX) 61 MG CAPS, Take 61 mg by mouth daily., Disp: 30 capsule, Rfl: 11 .  acetaminophen (TYLENOL) 500 MG tablet, Take 1 tablet (500 mg total) by mouth every 6 (six) hours as needed., Disp: 30 tablet, Rfl: 0 .  albuterol (PROVENTIL HFA;VENTOLIN HFA) 108 (90 Base) MCG/ACT inhaler, Inhale 2 puffs into the lungs every 6 (six) hours as needed for wheezing or shortness of breath., Disp: 18 g, Rfl: 0 .  baclofen (LIORESAL) 10 MG tablet, TK 1 T PO BID PRN, Disp: , Rfl:  .  glucose blood (FREESTYLE TEST STRIPS) test strip, Use as instructed, Disp: 100 each, Rfl: 0 .  glucose monitoring kit (FREESTYLE) monitoring kit, 1 each by Does not apply route 4 (four) times daily - after meals and at bedtime. 1 month Diabetic Testing Supplies for QAC-QHS accuchecks., Disp: 1 each, Rfl: 1 .  Insulin Glargine  (LANTUS SOLOSTAR) 100 UNIT/ML Solostar Pen, Inject 50 Units into the skin daily at 10 pm., Disp: 15 mL, Rfl: 0 .  Insulin Pen Needle 32G X 8 MM MISC, Use as directed, Disp: 100 each, Rfl: 0 .  isosorbide-hydrALAZINE (BIDIL) 20-37.5 MG tablet, Take 1 tablet by mouth 3 (three) times daily., Disp: 90 tablet, Rfl: 6 .  Lancets (FREESTYLE) lancets, Use as instructed, Disp: 100 each, Rfl: 0 .  metroNIDAZOLE (FLAGYL) 500 MG tablet, TK 2 TS PO D, Disp: , Rfl:  .  naproxen sodium (ALEVE) 220 MG tablet, Take 220 mg by mouth daily as needed (for pain or headache). , Disp: , Rfl:  .  polyethylene glycol (MIRALAX / GLYCOLAX) packet, Take 17 g by mouth daily as needed for mild constipation. (Patient not taking: Reported on 03/25/2018), Disp: 14 each, Rfl: 0 .  valACYclovir (VALTREX) 1000 MG tablet, Take 1 tablet (1,000 mg total) by mouth 3 (three) times daily. (Patient not taking: Reported on 03/25/2018), Disp: 21 tablet, Rfl: 0 .  Vitamin D, Ergocalciferol, (DRISDOL) 1.25 MG (50000 UT) CAPS capsule, TK 1 C PO WEEKLY, Disp: , Rfl:  Allergies  Allergen Reactions  . Morphine And Related Nausea Only    Severe nausea  . Penicillins Other (See Comments)    Pt states she has had a pain in her leg since a penicillin injection 2 months ago (reported 08/31/17).  Has tolerated amoxicillin oral.  Has patient had a PCN reaction causing immediate rash, facial/tongue/throat swelling, SOB or lightheadedness with hypotension: No Has patient had a PCN reaction causing severe rash involving mucus membranes or skin necrosis: No Has patient had a PCN reaction that required hospitalization: No Has patient had a PCN reaction occurring within the last 10 years: Yes If a      Social History   Socioeconomic History  . Marital status: Single    Spouse name: Not on file  . Number of children: 4  . Years of education: 8  . Highest education level: Not on file  Occupational History  . Not on file  Social Needs  . Financial  resource strain: Not hard at all  . Food insecurity:    Worry: Never true    Inability: Never true  . Transportation needs:    Medical: No    Non-medical: No  Tobacco Use  . Smoking status: Former Smoker    Packs/day: 1.00    Years: 40.00    Pack years: 40.00    Types: Cigarettes    Start date: 06/23/1972    Last attempt to quit: 03/26/2012    Years since quitting: 6.0  . Smokeless tobacco: Never Used  Substance and Sexual Activity  . Alcohol use: No  . Drug use: No  . Sexual activity: Yes  Lifestyle  . Physical activity:    Days per week: Not on file  Minutes per session: Not on file  . Stress: Not on file  Relationships  . Social connections:    Talks on phone: Not on file    Gets together: Not on file    Attends religious service: Not on file    Active member of club or organization: Not on file    Attends meetings of clubs or organizations: Not on file    Relationship status: Not on file  . Intimate partner violence:    Fear of current or ex partner: Not on file    Emotionally abused: Not on file    Physically abused: Not on file    Forced sexual activity: Not on file  Other Topics Concern  . Not on file  Social History Narrative  . Not on file    Physical Exam Cardiovascular:     Rate and Rhythm: Normal rate and regular rhythm.     Pulses: Normal pulses.  Pulmonary:     Effort: Pulmonary effort is normal.     Breath sounds: Normal breath sounds.  Abdominal:     General: There is no distension.     Palpations: Abdomen is soft.  Musculoskeletal: Normal range of motion.     Right lower leg: No edema.     Left lower leg: No edema.  Skin:    General: Skin is warm and dry.     Capillary Refill: Capillary refill takes less than 2 seconds.  Neurological:     Mental Status: She is alert and oriented to person, place, and time.  Psychiatric:        Mood and Affect: Mood normal.         Future Appointments  Date Time Provider [place]   05/10/2018  8:30 AM Bernarda Caffey, MD [facility] None  06/07/2018 11:05 AM [facility] DEVICE REMOTES [facility]  06/22/2018  9:00 AM Larey Dresser, MD [facility] None    BP 114/70 (BP Location: Right Arm, Patient Position: Sitting, Cuff Size: Normal)   Pulse 98   Resp 16   Wt 219 lb 9.6 oz (99.6 kg)   SpO2 97%   BMI 34.39 kg/m   Weight yesterday- Did not weigh Last visit weight- 222 lb  [name] was seen at home today and reported feeling well. She denied chest pain, SOB, headahe, dizziness or orthopnea. She stated she has been compliant with her medications and her weight has been stable. Her medications were verified and her pillbox was refilled. I called in all necessary refills. The opnly this she needs to have placed in her pillbox is Bidil but she is ok until next week. I have informed her of this and she stated she would be able to pick them up from the pharmacy tomorrow.   [name], EMT 04/01/18  ACTION: Home visit completed Next visit planned for 1 week

## 2018-04-13 ENCOUNTER — Other Ambulatory Visit (HOSPITAL_COMMUNITY): Payer: Self-pay | Admitting: Student

## 2018-04-14 ENCOUNTER — Telehealth (HOSPITAL_COMMUNITY): Payer: Self-pay

## 2018-04-14 NOTE — Telephone Encounter (Signed)
I called [name] to schedule an appointment. She stated she was not home today but advised she would be available all day tomorrow. We agreed to meet at 09:30 on Thursday.

## 2018-04-15 ENCOUNTER — Other Ambulatory Visit (HOSPITAL_COMMUNITY): Payer: Self-pay

## 2018-04-15 NOTE — Progress Notes (Signed)
Paramedicine Encounter    Patient ID: [name], female    DOB: 09-08-1950, 68 y.o.   MRN: [id number]   Patient Care Team: Jeanella Anton, NP as PCP - General (Nurse Practitioner) Jorge Ny, LCSW as Social Worker (Licensed Clinical Social Worker)  Patient Active Problem List   Diagnosis Date Noted  . Right foot infection 10/22/2017  . Cellulitis in diabetic foot ([place]) 10/22/2017  . Hyperglycemia 10/22/2017  . Lumbar radiculopathy 09/18/2017  . Degenerative spondylolisthesis 09/18/2017  . Atrial fibrillation ([place]) 02/11/2017  . Paroxysmal A-fib ([place])   . Biventricular automatic implantable cardioverter defibrillator in situ   . Sepsis ([place]) 04/20/2016  . UTI (urinary tract infection) 04/20/2016  . Dyspnea 07/19/2015  . Interstitial lung disease ([place]) 11/20/2014  . SIRS (systemic inflammatory response syndrome) ([place]) 02/03/2014  . IDDM (insulin dependent diabetes mellitus) ([place]) 02/03/2014  . Tachycardia 06/14/2013  . Chronic systolic CHF (congestive heart failure) ([place]) 09/29/2012  . Hypoxia 03/06/2012  . Hypertensive heart disease 03/06/2012  . Nonischemic cardiomyopathy ([place]) 03/06/2012  . Tobacco abuse 03/06/2012  . Type 2 diabetes mellitus ([place]) 03/06/2012  . Hypertension 03/06/2012  . CAD (coronary artery disease), native coronary artery 03/06/2012  . Hypokalemia 03/06/2012  . Acute bronchitis 02/01/2009  . Sleep apnea 02/01/2009  . Chest pain 02/01/2009    Current Outpatient Medications:  .  acetaminophen (TYLENOL) 500 MG tablet, Take 1 tablet (500 mg total) by mouth every 6 (six) hours as needed., Disp: 30 tablet, Rfl: 0 .  albuterol (PROVENTIL HFA;VENTOLIN HFA) 108 (90 Base) MCG/ACT inhaler, Inhale 2 puffs into the lungs every 6 (six) hours as needed for wheezing or shortness of breath., Disp: 18 g, Rfl: 0 .  apixaban (ELIQUIS) 5 MG TABS tablet, Take 1 tablet (5 mg total) by mouth 2 (two) times daily., Disp: 60 tablet, Rfl: 3 .  atorvastatin (LIPITOR) 40  MG tablet, Take 1 tablet (40 mg total) by mouth daily., Disp: 30 tablet, Rfl: 3 .  baclofen (LIORESAL) 10 MG tablet, TK 1 T PO BID PRN, Disp: , Rfl:  .  carvedilol (COREG) 25 MG tablet, TAKE 1 TABLET(25 MG) BY MOUTH TWICE DAILY WITH A MEAL, Disp: 180 tablet, Rfl: 3 .  ENTRESTO 97-103 MG, TAKE 1 TABLET BY MOUTH TWICE DAILY, Disp: 60 tablet, Rfl: 6 .  furosemide (LASIX) 40 MG tablet, Take 1 tablet (40 mg total) by mouth 2 (two) times daily., Disp: 180 tablet, Rfl: 3 .  gabapentin (NEURONTIN) 100 MG capsule, TK 1 C PO TID, Disp: , Rfl:  .  glucose blood (FREESTYLE TEST STRIPS) test strip, Use as instructed, Disp: 100 each, Rfl: 0 .  glucose monitoring kit (FREESTYLE) monitoring kit, 1 each by Does not apply route 4 (four) times daily - after meals and at bedtime. 1 month Diabetic Testing Supplies for QAC-QHS accuchecks., Disp: 1 each, Rfl: 1 .  HUMALOG KWIKPEN 100 UNIT/ML KwikPen, INJECT 10 UNITS INTO THE SKIN WITH EACH MEAL, Disp: , Rfl:  .  insulin aspart (NOVOLOG FLEXPEN) 100 UNIT/ML FlexPen, 0-20 Units, Subcutaneous, 3 times daily with meals CBG < 70: implement hypoglycemia protocol CBG 70 - 120: 0 units CBG 121 - 150: 3 units CBG 151 - 200: 4 units CBG 201 - 250: 7 units CBG 251 - 300: 11 units CBG 301 - 350: 15 units CBG 351 - 400: 20 units CBG > 400: call MD, Disp: 15 mL, Rfl: 0 .  Insulin Glargine (LANTUS SOLOSTAR) 100 UNIT/ML Solostar Pen, Inject 50 Units into the  skin daily at 10 pm., Disp: 15 mL, Rfl: 0 .  Insulin Pen Needle 32G X 8 MM MISC, Use as directed, Disp: 100 each, Rfl: 0 .  isosorbide-hydrALAZINE (BIDIL) 20-37.5 MG tablet, Take 1 tablet by mouth 3 (three) times daily., Disp: 90 tablet, Rfl: 6 .  Lancets (FREESTYLE) lancets, Use as instructed, Disp: 100 each, Rfl: 0 .  metFORMIN (GLUCOPHAGE) 1000 MG tablet, TAKE 1 TABLET(1000 MG) BY MOUTH TWICE DAILY WITH A MEAL (Patient taking differently: Take 1,000 mg by mouth 2 (two) times daily. ), Disp: 60 tablet, Rfl: 0 .  metroNIDAZOLE  (FLAGYL) 500 MG tablet, TK 2 TS PO D, Disp: , Rfl:  .  naproxen sodium (ALEVE) 220 MG tablet, Take 220 mg by mouth daily as needed (for pain or headache). , Disp: , Rfl:  .  Oxycodone HCl 10 MG TABS, Take 10 mg by mouth 4 (four) times daily as needed (for pain)., Disp: , Rfl:  .  polyethylene glycol (MIRALAX / GLYCOLAX) packet, Take 17 g by mouth daily as needed for mild constipation. (Patient not taking: Reported on 03/25/2018), Disp: 14 each, Rfl: 0 .  Potassium Chloride ER 20 MEQ TBCR, Take 40 mEq by mouth daily., Disp: 60 tablet, Rfl: 5 .  spironolactone (ALDACTONE) 25 MG tablet, TAKE 1 TABLET(25 MG) BY MOUTH DAILY, Disp: 30 tablet, Rfl: 3 .  Tafamidis (VYNDAMAX) 61 MG CAPS, Take 61 mg by mouth daily., Disp: 30 capsule, Rfl: 11 .  valACYclovir (VALTREX) 1000 MG tablet, Take 1 tablet (1,000 mg total) by mouth 3 (three) times daily. (Patient not taking: Reported on 03/25/2018), Disp: 21 tablet, Rfl: 0 .  Vitamin D, Ergocalciferol, (DRISDOL) 1.25 MG (50000 UT) CAPS capsule, TK 1 C PO WEEKLY, Disp: , Rfl:  Allergies  Allergen Reactions  . Morphine And Related Nausea Only    Severe nausea  . Penicillins Other (See Comments)    Pt states she has had a pain in her leg since a penicillin injection 2 months ago (reported 08/31/17).  Has tolerated amoxicillin oral.  Has patient had a PCN reaction causing immediate rash, facial/tongue/throat swelling, SOB or lightheadedness with hypotension: No Has patient had a PCN reaction causing severe rash involving mucus membranes or skin necrosis: No Has patient had a PCN reaction that required hospitalization: No Has patient had a PCN reaction occurring within the last 10 years: Yes If a      Social History   Socioeconomic History  . Marital status: Single    Spouse name: Not on file  . Number of children: 4  . Years of education: 26  . Highest education level: Not on file  Occupational History  . Not on file  Social Needs  . Financial resource  strain: Not hard at all  . Food insecurity:    Worry: Never true    Inability: Never true  . Transportation needs:    Medical: No    Non-medical: No  Tobacco Use  . Smoking status: Former Smoker    Packs/day: 1.00    Years: 40.00    Pack years: 40.00    Types: Cigarettes    Start date: 06/23/1972    Last attempt to quit: 03/26/2012    Years since quitting: 6.0  . Smokeless tobacco: Never Used  Substance and Sexual Activity  . Alcohol use: No  . Drug use: No  . Sexual activity: Yes  Lifestyle  . Physical activity:    Days per week: Not on file    Minutes  per session: Not on file  . Stress: Not on file  Relationships  . Social connections:    Talks on phone: Not on file    Gets together: Not on file    Attends religious service: Not on file    Active member of club or organization: Not on file    Attends meetings of clubs or organizations: Not on file    Relationship status: Not on file  . Intimate partner violence:    Fear of current or ex partner: Not on file    Emotionally abused: Not on file    Physically abused: Not on file    Forced sexual activity: Not on file  Other Topics Concern  . Not on file  Social History Narrative  . Not on file    Physical Exam      Future Appointments  Date Time Provider [place]  05/10/2018  8:30 AM Bernarda Caffey, MD [facility] None  06/07/2018 11:05 AM [facility]ICE [facility]OT[facility]  06/22/2018  9:00 AM Larey Dresser, MD [facility] None    There were no vitals taken for this visit.  Weight yesterday- Did not weigh Last visit weight- 219.6 lb  [name] was sene at home today and reported feeling well. She denied chest pain, SOB, headache, dizziness or orthopnea. She reported being compliant with her medications over the past two weeks however she had missed 4 days in a row. She also had not taken any of her medications today. She was our of several medications which we ordered at our last visit. I  contacted the pharmacy and they advised they could have them ready for pick up tomorrow at lunch time. I advised [name] to pick up her medications and call me when she has them so I can come finish filling her pillboxes. She did have enough to get through tomorrow so her box was refilled accordingly. I will follow up tomorrow.   [name], EMT 04/15/18  ACTION: Home visit completed Next visit planned for tomorrow.

## 2018-04-16 ENCOUNTER — Telehealth (HOSPITAL_COMMUNITY): Payer: Self-pay

## 2018-04-16 ENCOUNTER — Telehealth (HOSPITAL_COMMUNITY): Payer: Self-pay | Admitting: Licensed Clinical Social Worker

## 2018-04-16 NOTE — Telephone Encounter (Signed)
CSW reached out to pt to check in regarding food and medication status at this time. Pt is planning to pick up her medications today and then Zack with paramedicine will follow up to help her with her pill box.  Pt also states she has plenty of food and her son is helping her make sure her home remains clean.  CSW encouraged pt to reach out with any concerns and will continue to follow and assist as needed  Jorge Ny, [place] Worker [facility] [phone]

## 2018-04-16 NOTE — Telephone Encounter (Signed)
I called [name] because I had not heard from her today and she was supposed to call me when she picked up her medications from the pharmacy. She stated she forgot to pick them up but would be able to add Bidil to her box and I advised that I would come out on Tuesday. I confirmed that she knew to take Bidil TID. She expressed understanding and was agreeable.

## 2018-04-20 ENCOUNTER — Other Ambulatory Visit (HOSPITAL_COMMUNITY): Payer: Self-pay

## 2018-04-20 NOTE — Progress Notes (Signed)
Paramedicine Encounter    Patient ID: [name], female    DOB: Apr 17, 1950, 68 y.o.   MRN: [id number]   Patient Care Team: Jeanella Anton, NP as PCP - General (Nurse Practitioner) Jorge Ny, LCSW as Social Worker (Licensed Clinical Social Worker)  Patient Active Problem List   Diagnosis Date Noted  . Right foot infection 10/22/2017  . Cellulitis in diabetic foot ([place]) 10/22/2017  . Hyperglycemia 10/22/2017  . Lumbar radiculopathy 09/18/2017  . Degenerative spondylolisthesis 09/18/2017  . Atrial fibrillation ([place]) 02/11/2017  . Paroxysmal A-fib ([place])   . Biventricular automatic implantable cardioverter defibrillator in situ   . Sepsis ([place]) 04/20/2016  . UTI (urinary tract infection) 04/20/2016  . Dyspnea 07/19/2015  . Interstitial lung disease ([place]) 11/20/2014  . SIRS (systemic inflammatory response syndrome) ([place]) 02/03/2014  . IDDM (insulin dependent diabetes mellitus) ([place]) 02/03/2014  . Tachycardia 06/14/2013  . Chronic systolic CHF (congestive heart failure) ([place]) 09/29/2012  . Hypoxia 03/06/2012  . Hypertensive heart disease 03/06/2012  . Nonischemic cardiomyopathy ([place]) 03/06/2012  . Tobacco abuse 03/06/2012  . Type 2 diabetes mellitus ([place]) 03/06/2012  . Hypertension 03/06/2012  . CAD (coronary artery disease), native coronary artery 03/06/2012  . Hypokalemia 03/06/2012  . Acute bronchitis 02/01/2009  . Sleep apnea 02/01/2009  . Chest pain 02/01/2009    Current Outpatient Medications:  .  acetaminophen (TYLENOL) 500 MG tablet, Take 1 tablet (500 mg total) by mouth every 6 (six) hours as needed., Disp: 30 tablet, Rfl: 0 .  albuterol (PROVENTIL HFA;VENTOLIN HFA) 108 (90 Base) MCG/ACT inhaler, Inhale 2 puffs into the lungs every 6 (six) hours as needed for wheezing or shortness of breath., Disp: 18 g, Rfl: 0 .  apixaban (ELIQUIS) 5 MG TABS tablet, Take 1 tablet (5 mg total) by mouth 2 (two) times daily., Disp: 60 tablet, Rfl: 3 .  atorvastatin (LIPITOR) 40  MG tablet, Take 1 tablet (40 mg total) by mouth daily., Disp: 30 tablet, Rfl: 3 .  baclofen (LIORESAL) 10 MG tablet, TK 1 T PO BID PRN, Disp: , Rfl:  .  carvedilol (COREG) 25 MG tablet, TAKE 1 TABLET(25 MG) BY MOUTH TWICE DAILY WITH A MEAL, Disp: 180 tablet, Rfl: 3 .  ENTRESTO 97-103 MG, TAKE 1 TABLET BY MOUTH TWICE DAILY, Disp: 60 tablet, Rfl: 6 .  furosemide (LASIX) 40 MG tablet, Take 1 tablet (40 mg total) by mouth 2 (two) times daily., Disp: 180 tablet, Rfl: 3 .  gabapentin (NEURONTIN) 100 MG capsule, TK 1 C PO TID, Disp: , Rfl:  .  HUMALOG KWIKPEN 100 UNIT/ML KwikPen, INJECT 10 UNITS INTO THE SKIN WITH EACH MEAL, Disp: , Rfl:  .  Insulin Glargine (LANTUS SOLOSTAR) 100 UNIT/ML Solostar Pen, Inject 50 Units into the skin daily at 10 pm., Disp: 15 mL, Rfl: 0 .  isosorbide-hydrALAZINE (BIDIL) 20-37.5 MG tablet, Take 1 tablet by mouth 3 (three) times daily., Disp: 90 tablet, Rfl: 6 .  metFORMIN (GLUCOPHAGE) 1000 MG tablet, TAKE 1 TABLET(1000 MG) BY MOUTH TWICE DAILY WITH A MEAL (Patient taking differently: Take 1,000 mg by mouth 2 (two) times daily. ), Disp: 60 tablet, Rfl: 0 .  naproxen sodium (ALEVE) 220 MG tablet, Take 220 mg by mouth daily as needed (for pain or headache). , Disp: , Rfl:  .  Oxycodone HCl 10 MG TABS, Take 10 mg by mouth 4 (four) times daily as needed (for pain)., Disp: , Rfl:  .  Potassium Chloride ER 20 MEQ TBCR, Take 40 mEq by mouth daily.,  Disp: 60 tablet, Rfl: 5 .  spironolactone (ALDACTONE) 25 MG tablet, TAKE 1 TABLET(25 MG) BY MOUTH DAILY, Disp: 30 tablet, Rfl: 3 .  Tafamidis (VYNDAMAX) 61 MG CAPS, Take 61 mg by mouth daily., Disp: 30 capsule, Rfl: 11 .  glucose blood (FREESTYLE TEST STRIPS) test strip, Use as instructed, Disp: 100 each, Rfl: 0 .  glucose monitoring kit (FREESTYLE) monitoring kit, 1 each by Does not apply route 4 (four) times daily - after meals and at bedtime. 1 month Diabetic Testing Supplies for QAC-QHS accuchecks., Disp: 1 each, Rfl: 1 .  insulin  aspart (NOVOLOG FLEXPEN) 100 UNIT/ML FlexPen, 0-20 Units, Subcutaneous, 3 times daily with meals CBG < 70: implement hypoglycemia protocol CBG 70 - 120: 0 units CBG 121 - 150: 3 units CBG 151 - 200: 4 units CBG 201 - 250: 7 units CBG 251 - 300: 11 units CBG 301 - 350: 15 units CBG 351 - 400: 20 units CBG > 400: call MD, Disp: 15 mL, Rfl: 0 .  Insulin Pen Needle 32G X 8 MM MISC, Use as directed, Disp: 100 each, Rfl: 0 .  Lancets (FREESTYLE) lancets, Use as instructed, Disp: 100 each, Rfl: 0 .  metroNIDAZOLE (FLAGYL) 500 MG tablet, TK 2 TS PO D, Disp: , Rfl:  .  polyethylene glycol (MIRALAX / GLYCOLAX) packet, Take 17 g by mouth daily as needed for mild constipation. (Patient not taking: Reported on 03/25/2018), Disp: 14 each, Rfl: 0 .  valACYclovir (VALTREX) 1000 MG tablet, Take 1 tablet (1,000 mg total) by mouth 3 (three) times daily. (Patient not taking: Reported on 04/20/2018), Disp: 21 tablet, Rfl: 0 .  Vitamin D, Ergocalciferol, (DRISDOL) 1.25 MG (50000 UT) CAPS capsule, TK 1 C PO WEEKLY, Disp: , Rfl:  Allergies  Allergen Reactions  . Morphine And Related Nausea Only    Severe nausea  . Penicillins Other (See Comments)    Pt states she has had a pain in her leg since a penicillin injection 2 months ago (reported 08/31/17).  Has tolerated amoxicillin oral.  Has patient had a PCN reaction causing immediate rash, facial/tongue/throat swelling, SOB or lightheadedness with hypotension: No Has patient had a PCN reaction causing severe rash involving mucus membranes or skin necrosis: No Has patient had a PCN reaction that required hospitalization: No Has patient had a PCN reaction occurring within the last 10 years: Yes If a      Social History   Socioeconomic History  . Marital status: Single    Spouse name: Not on file  . Number of children: 4  . Years of education: 43  . Highest education level: Not on file  Occupational History  . Not on file  Social Needs  . Financial resource strain:  Not hard at all  . Food insecurity:    Worry: Never true    Inability: Never true  . Transportation needs:    Medical: No    Non-medical: No  Tobacco Use  . Smoking status: Former Smoker    Packs/day: 1.00    Years: 40.00    Pack years: 40.00    Types: Cigarettes    Start date: 06/23/1972    Last attempt to quit: 03/26/2012    Years since quitting: 6.0  . Smokeless tobacco: Never Used  Substance and Sexual Activity  . Alcohol use: No  . Drug use: No  . Sexual activity: Yes  Lifestyle  . Physical activity:    Days per week: Not on file    Minutes  per session: Not on file  . Stress: Not on file  Relationships  . Social connections:    Talks on phone: Not on file    Gets together: Not on file    Attends religious service: Not on file    Active member of club or organization: Not on file    Attends meetings of clubs or organizations: Not on file    Relationship status: Not on file  . Intimate partner violence:    Fear of current or ex partner: Not on file    Emotionally abused: Not on file    Physically abused: Not on file    Forced sexual activity: Not on file  Other Topics Concern  . Not on file  Social History Narrative  . Not on file    Physical Exam Cardiovascular:     Rate and Rhythm: Normal rate and regular rhythm.     Pulses: Normal pulses.  Pulmonary:     Effort: Pulmonary effort is normal.     Breath sounds: Normal breath sounds.  Musculoskeletal: Normal range of motion.     Right lower leg: No edema.     Left lower leg: No edema.  Skin:    General: Skin is warm and dry.     Capillary Refill: Capillary refill takes less than 2 seconds.  Neurological:     Mental Status: She is alert and oriented to person, place, and time.  Psychiatric:        Mood and Affect: Mood normal.         Future Appointments  Date Time Provider [place]  05/10/2018  8:30 AM Bernarda Caffey, MD [facility] None  06/07/2018 11:05 AM [facility] DEVICE REMOTES  [facility]  06/22/2018  9:00 AM Larey Dresser, MD [facility] None    BP 127/74 (BP Location: Left Arm, Patient Position: Sitting, Cuff Size: Normal)   Pulse 90   Resp 16   Wt 219 lb 9.6 oz (99.6 kg)   SpO2 98%   BMI 34.39 kg/m   Weight yesterday- Did not weigh Last visit weight- 222 lb  [name] was seen at home today and reported feeling generally well. She denied chest pain, SOB, headache, dizziness or orthopnea. She stated she has been compliant with her medications over the past week. She picked up several medications however she the pharmacy did not fill the metformin because they had not heard back from the physician. They said they are going to call again today to try and get in touch. Her medications were verified and her pillbox was refilled. I marked her pillbox with black dots where the metformin should be placed when she picks it up. She was understanding and agreeable.     [name], EMT 04/20/18  ACTION: Home visit completed Next visit planned for 2 weeks

## 2018-04-29 ENCOUNTER — Telehealth (HOSPITAL_COMMUNITY): Payer: Self-pay

## 2018-04-29 NOTE — Telephone Encounter (Signed)
I called [name] to see if she was available for me to come by today or tomorrow. She stated she was running errands with her daughter but would like to see me tomorrow. She requested I call tomorrow to set a specific time since she was not home at the time we spoke.

## 2018-05-04 ENCOUNTER — Telehealth (HOSPITAL_COMMUNITY): Payer: Self-pay

## 2018-05-04 ENCOUNTER — Other Ambulatory Visit (HOSPITAL_COMMUNITY): Payer: Self-pay

## 2018-05-04 NOTE — Telephone Encounter (Signed)
I called [name] to schedule an appointment however she did not answer. I left a voicemail requesting she call me back when she is able so I can set up a time to meet.

## 2018-05-04 NOTE — Progress Notes (Signed)
Paramedicine Encounter    Patient ID: [name], female    DOB: 1950/02/24, 68 y.o.   MRN: [id number]   Patient Care Team: Jeanella Anton, NP as PCP - General (Nurse Practitioner) Jorge Ny, LCSW as Social Worker (Licensed Clinical Social Worker)  Patient Active Problem List   Diagnosis Date Noted  . Right foot infection 10/22/2017  . Cellulitis in diabetic foot ([place]) 10/22/2017  . Hyperglycemia 10/22/2017  . Lumbar radiculopathy 09/18/2017  . Degenerative spondylolisthesis 09/18/2017  . Atrial fibrillation ([place]) 02/11/2017  . Paroxysmal A-fib ([place])   . Biventricular automatic implantable cardioverter defibrillator in situ   . Sepsis ([place]) 04/20/2016  . UTI (urinary tract infection) 04/20/2016  . Dyspnea 07/19/2015  . Interstitial lung disease ([place]) 11/20/2014  . SIRS (systemic inflammatory response syndrome) ([place]) 02/03/2014  . IDDM (insulin dependent diabetes mellitus) ([place]) 02/03/2014  . Tachycardia 06/14/2013  . Chronic systolic CHF (congestive heart failure) ([place]) 09/29/2012  . Hypoxia 03/06/2012  . Hypertensive heart disease 03/06/2012  . Nonischemic cardiomyopathy ([place]) 03/06/2012  . Tobacco abuse 03/06/2012  . Type 2 diabetes mellitus ([place]) 03/06/2012  . Hypertension 03/06/2012  . CAD (coronary artery disease), native coronary artery 03/06/2012  . Hypokalemia 03/06/2012  . Acute bronchitis 02/01/2009  . Sleep apnea 02/01/2009  . Chest pain 02/01/2009    Current Outpatient Medications:  .  acetaminophen (TYLENOL) 500 MG tablet, Take 1 tablet (500 mg total) by mouth every 6 (six) hours as needed., Disp: 30 tablet, Rfl: 0 .  albuterol (PROVENTIL HFA;VENTOLIN HFA) 108 (90 Base) MCG/ACT inhaler, Inhale 2 puffs into the lungs every 6 (six) hours as needed for wheezing or shortness of breath., Disp: 18 g, Rfl: 0 .  apixaban (ELIQUIS) 5 MG TABS tablet, Take 1 tablet (5 mg total) by mouth 2 (two) times daily., Disp: 60 tablet, Rfl: 3 .  atorvastatin (LIPITOR) 40  MG tablet, Take 1 tablet (40 mg total) by mouth daily., Disp: 30 tablet, Rfl: 3 .  baclofen (LIORESAL) 10 MG tablet, TK 1 T PO BID PRN, Disp: , Rfl:  .  carvedilol (COREG) 25 MG tablet, TAKE 1 TABLET(25 MG) BY MOUTH TWICE DAILY WITH A MEAL, Disp: 180 tablet, Rfl: 3 .  ENTRESTO 97-103 MG, TAKE 1 TABLET BY MOUTH TWICE DAILY, Disp: 60 tablet, Rfl: 6 .  furosemide (LASIX) 40 MG tablet, Take 1 tablet (40 mg total) by mouth 2 (two) times daily., Disp: 180 tablet, Rfl: 3 .  gabapentin (NEURONTIN) 100 MG capsule, TK 1 C PO TID, Disp: , Rfl:  .  HUMALOG KWIKPEN 100 UNIT/ML KwikPen, INJECT 10 UNITS INTO THE SKIN WITH EACH MEAL, Disp: , Rfl:  .  insulin aspart (NOVOLOG FLEXPEN) 100 UNIT/ML FlexPen, 0-20 Units, Subcutaneous, 3 times daily with meals CBG < 70: implement hypoglycemia protocol CBG 70 - 120: 0 units CBG 121 - 150: 3 units CBG 151 - 200: 4 units CBG 201 - 250: 7 units CBG 251 - 300: 11 units CBG 301 - 350: 15 units CBG 351 - 400: 20 units CBG > 400: call MD, Disp: 15 mL, Rfl: 0 .  Insulin Glargine (LANTUS SOLOSTAR) 100 UNIT/ML Solostar Pen, Inject 50 Units into the skin daily at 10 pm., Disp: 15 mL, Rfl: 0 .  Insulin Pen Needle 32G X 8 MM MISC, Use as directed, Disp: 100 each, Rfl: 0 .  isosorbide-hydrALAZINE (BIDIL) 20-37.5 MG tablet, Take 1 tablet by mouth 3 (three) times daily., Disp: 90 tablet, Rfl: 6 .  Oxycodone HCl 10 MG  TABS, Take 10 mg by mouth 4 (four) times daily as needed (for pain)., Disp: , Rfl:  .  Potassium Chloride ER 20 MEQ TBCR, Take 40 mEq by mouth daily., Disp: 60 tablet, Rfl: 5 .  spironolactone (ALDACTONE) 25 MG tablet, TAKE 1 TABLET(25 MG) BY MOUTH DAILY, Disp: 30 tablet, Rfl: 3 .  Tafamidis (VYNDAMAX) 61 MG CAPS, Take 61 mg by mouth daily., Disp: 30 capsule, Rfl: 11 .  glucose blood (FREESTYLE TEST STRIPS) test strip, Use as instructed, Disp: 100 each, Rfl: 0 .  glucose monitoring kit (FREESTYLE) monitoring kit, 1 each by Does not apply route 4 (four) times daily - after  meals and at bedtime. 1 month Diabetic Testing Supplies for QAC-QHS accuchecks., Disp: 1 each, Rfl: 1 .  Lancets (FREESTYLE) lancets, Use as instructed, Disp: 100 each, Rfl: 0 .  metFORMIN (GLUCOPHAGE) 1000 MG tablet, TAKE 1 TABLET(1000 MG) BY MOUTH TWICE DAILY WITH A MEAL (Patient not taking: No sig reported), Disp: 60 tablet, Rfl: 0 .  metroNIDAZOLE (FLAGYL) 500 MG tablet, TK 2 TS PO D, Disp: , Rfl:  .  naproxen sodium (ALEVE) 220 MG tablet, Take 220 mg by mouth daily as needed (for pain or headache). , Disp: , Rfl:  .  polyethylene glycol (MIRALAX / GLYCOLAX) packet, Take 17 g by mouth daily as needed for mild constipation. (Patient not taking: Reported on 03/25/2018), Disp: 14 each, Rfl: 0 .  valACYclovir (VALTREX) 1000 MG tablet, Take 1 tablet (1,000 mg total) by mouth 3 (three) times daily. (Patient not taking: Reported on 04/20/2018), Disp: 21 tablet, Rfl: 0 .  Vitamin D, Ergocalciferol, (DRISDOL) 1.25 MG (50000 UT) CAPS capsule, TK 1 C PO WEEKLY, Disp: , Rfl:  Allergies  Allergen Reactions  . Morphine And Related Nausea Only    Severe nausea  . Penicillins Other (See Comments)    Pt states she has had a pain in her leg since a penicillin injection 2 months ago (reported 08/31/17).  Has tolerated amoxicillin oral.  Has patient had a PCN reaction causing immediate rash, facial/tongue/throat swelling, SOB or lightheadedness with hypotension: No Has patient had a PCN reaction causing severe rash involving mucus membranes or skin necrosis: No Has patient had a PCN reaction that required hospitalization: No Has patient had a PCN reaction occurring within the last 10 years: Yes If a      Social History   Socioeconomic History  . Marital status: Single    Spouse name: Not on file  . Number of children: 4  . Years of education: 62  . Highest education level: Not on file  Occupational History  . Not on file  Social Needs  . Financial resource strain: Not hard at all  . Food insecurity:     Worry: Never true    Inability: Never true  . Transportation needs:    Medical: No    Non-medical: No  Tobacco Use  . Smoking status: Former Smoker    Packs/day: 1.00    Years: 40.00    Pack years: 40.00    Types: Cigarettes    Start date: 06/23/1972    Last attempt to quit: 03/26/2012    Years since quitting: 6.1  . Smokeless tobacco: Never Used  Substance and Sexual Activity  . Alcohol use: No  . Drug use: No  . Sexual activity: Yes  Lifestyle  . Physical activity:    Days per week: Not on file    Minutes per session: Not on file  .  Stress: Not on file  Relationships  . Social connections:    Talks on phone: Not on file    Gets together: Not on file    Attends religious service: Not on file    Active member of club or organization: Not on file    Attends meetings of clubs or organizations: Not on file    Relationship status: Not on file  . Intimate partner violence:    Fear of current or ex partner: Not on file    Emotionally abused: Not on file    Physically abused: Not on file    Forced sexual activity: Not on file  Other Topics Concern  . Not on file  Social History Narrative  . Not on file    Physical Exam Cardiovascular:     Rate and Rhythm: Normal rate and regular rhythm.     Pulses: Normal pulses.  Pulmonary:     Effort: Pulmonary effort is normal.     Breath sounds: Normal breath sounds.  Musculoskeletal: Normal range of motion.     Right lower leg: No edema.     Left lower leg: No edema.  Skin:    General: Skin is warm and dry.     Capillary Refill: Capillary refill takes less than 2 seconds.  Neurological:     Mental Status: She is alert and oriented to person, place, and time.         Future Appointments  Date Time Provider [place]  05/10/2018  8:30 AM Bernarda Caffey, MD [facility] None  06/07/2018 11:05 AM [facility]  06/22/2018  9:00 AM Larey Dresser, MD [facility] None    BP (!)  104/56 (BP Location: Left Arm, Patient Position: Sitting, Cuff Size: Normal)   Pulse 94   Resp 18   Wt 222 lb 9.6 oz (101 kg)   SpO2 99%   BMI 34.86 kg/m   Weight yesterday- 222 lb Last visit weight- 219 lb  [name] was seen at home today and reported feeling generally well. She denied chest pain, SOB, headache, dizziness, orthopnea, cough or fever. She stated he has been compliant with her medications over the past two weeks and her weight has been stable. Her medications were verified and her pillbox was refilled. I will come back Friday to finish filling her pillbox.   [name], EMT 05/04/18  ACTION: Home visit completed Next visit planned for Friday

## 2018-05-05 ENCOUNTER — Telehealth (HOSPITAL_COMMUNITY): Payer: Self-pay

## 2018-05-05 NOTE — Telephone Encounter (Signed)
Ms Debes called me to advise that she was able to pick up atorvastatin and Bidil from the pharmacy but the pharmacy was not able to fill her metformin. I advised that she would need to contact her PCP to get this prescription since it is not HF related. She was understanding and agreeable. She also asked that I come by early tomorrow morning to finish he pillbox because she has somewhere to be at 11.

## 2018-05-06 ENCOUNTER — Other Ambulatory Visit (HOSPITAL_COMMUNITY): Payer: Self-pay

## 2018-05-06 MED ORDER — FUROSEMIDE 40 MG PO TABS: 40.0000 mg | ORAL_TABLET | Freq: Two times a day (BID) | ORAL | 3 refills | Status: DC

## 2018-05-06 NOTE — Progress Notes (Signed)
[name] was seen at home today for the purpose of refilling her pillbox. She did not have any metformin because her PCP would not send a new Rx until after her appointment next week. All other medications were present and her pillbox was refilled for the next two weeks. I will follow up in two weeks and told her to call me if she has any problems before then. She was understanding and agreeable.

## 2018-05-10 ENCOUNTER — Encounter (INDEPENDENT_AMBULATORY_CARE_PROVIDER_SITE_OTHER): Payer: Medicare Other | Admitting: Ophthalmology

## 2018-05-13 ENCOUNTER — Telehealth (HOSPITAL_COMMUNITY): Payer: Self-pay | Admitting: Licensed Clinical Social Worker

## 2018-05-13 NOTE — Telephone Encounter (Signed)
CSW reached out to pt to check in regarding food and medication status at this time. Pt reports that she has everything she needs at this time and has a son who lives with her and helps her obtain things when needed.  Pt has been trying to stay busy and has been enjoying spending time outdoors on her porch when she can.  CSW encouraged pt to reach out with any concerns and will continue to follow and assist as needed  Jorge Ny, [place] Worker [facility] [phone]

## 2018-05-14 ENCOUNTER — Telehealth (HOSPITAL_COMMUNITY): Payer: Self-pay | Admitting: Licensed Clinical Social Worker

## 2018-05-14 NOTE — Telephone Encounter (Signed)
Patient identified as a candidate to receive 14 heart healthy meals per week for 3 months through [facility] partnership with [employer] program.   Completed referral sent in for review.  Anticipate patient will receive first shipment of food in 1-3 business days.  [name] H. Dilan Fullenwider, LCSW Clinical Social Worker [facility] Desk#: [phone] Cell#: [phone]   

## 2018-05-25 ENCOUNTER — Other Ambulatory Visit (HOSPITAL_COMMUNITY): Payer: Self-pay

## 2018-05-25 ENCOUNTER — Telehealth (HOSPITAL_COMMUNITY): Payer: Self-pay

## 2018-05-25 NOTE — Telephone Encounter (Signed)
I called Ms [name] to schedule an appointment. She stated she has a primary care appointment at 15:00 so we agreed to meet at 13:00.

## 2018-05-25 NOTE — Progress Notes (Signed)
Paramedicine Encounter    Patient ID: [name], female    DOB: December 30, 1950, 68 y.o.   MRN: [id number]   Patient Care Team: Jeanella Anton, NP as PCP - General (Nurse Practitioner) Jorge Ny, LCSW as Social Worker (Licensed Clinical Social Worker)  Patient Active Problem List   Diagnosis Date Noted  . Right foot infection 10/22/2017  . Cellulitis in diabetic foot ([place]) 10/22/2017  . Hyperglycemia 10/22/2017  . Lumbar radiculopathy 09/18/2017  . Degenerative spondylolisthesis 09/18/2017  . Atrial fibrillation ([place]) 02/11/2017  . Paroxysmal A-fib ([place])   . Biventricular automatic implantable cardioverter defibrillator in situ   . Sepsis ([place]) 04/20/2016  . UTI (urinary tract infection) 04/20/2016  . Dyspnea 07/19/2015  . Interstitial lung disease ([place]) 11/20/2014  . SIRS (systemic inflammatory response syndrome) ([place]) 02/03/2014  . IDDM (insulin dependent diabetes mellitus) ([place]) 02/03/2014  . Tachycardia 06/14/2013  . Chronic systolic CHF (congestive heart failure) ([place]) 09/29/2012  . Hypoxia 03/06/2012  . Hypertensive heart disease 03/06/2012  . Nonischemic cardiomyopathy ([place]) 03/06/2012  . Tobacco abuse 03/06/2012  . Type 2 diabetes mellitus ([place]) 03/06/2012  . Hypertension 03/06/2012  . CAD (coronary artery disease), native coronary artery 03/06/2012  . Hypokalemia 03/06/2012  . Acute bronchitis 02/01/2009  . Sleep apnea 02/01/2009  . Chest pain 02/01/2009    Current Outpatient Medications:  .  acetaminophen (TYLENOL) 500 MG tablet, Take 1 tablet (500 mg total) by mouth every 6 (six) hours as needed., Disp: 30 tablet, Rfl: 0 .  albuterol (PROVENTIL HFA;VENTOLIN HFA) 108 (90 Base) MCG/ACT inhaler, Inhale 2 puffs into the lungs every 6 (six) hours as needed for wheezing or shortness of breath., Disp: 18 g, Rfl: 0 .  apixaban (ELIQUIS) 5 MG TABS tablet, Take 1 tablet (5 mg total) by mouth 2 (two) times daily., Disp: 60 tablet, Rfl: 3 .  atorvastatin (LIPITOR) 40  MG tablet, Take 1 tablet (40 mg total) by mouth daily., Disp: 30 tablet, Rfl: 3 .  baclofen (LIORESAL) 10 MG tablet, TK 1 T PO BID PRN, Disp: , Rfl:  .  carvedilol (COREG) 25 MG tablet, TAKE 1 TABLET(25 MG) BY MOUTH TWICE DAILY WITH A MEAL, Disp: 180 tablet, Rfl: 3 .  ENTRESTO 97-103 MG, TAKE 1 TABLET BY MOUTH TWICE DAILY, Disp: 60 tablet, Rfl: 6 .  furosemide (LASIX) 40 MG tablet, Take 1 tablet (40 mg total) by mouth 2 (two) times daily., Disp: 180 tablet, Rfl: 3 .  gabapentin (NEURONTIN) 100 MG capsule, TK 1 C PO TID, Disp: , Rfl:  .  HUMALOG KWIKPEN 100 UNIT/ML KwikPen, INJECT 10 UNITS INTO THE SKIN WITH EACH MEAL, Disp: , Rfl:  .  insulin aspart (NOVOLOG FLEXPEN) 100 UNIT/ML FlexPen, 0-20 Units, Subcutaneous, 3 times daily with meals CBG < 70: implement hypoglycemia protocol CBG 70 - 120: 0 units CBG 121 - 150: 3 units CBG 151 - 200: 4 units CBG 201 - 250: 7 units CBG 251 - 300: 11 units CBG 301 - 350: 15 units CBG 351 - 400: 20 units CBG > 400: call MD, Disp: 15 mL, Rfl: 0 .  isosorbide-hydrALAZINE (BIDIL) 20-37.5 MG tablet, Take 1 tablet by mouth 3 (three) times daily., Disp: 90 tablet, Rfl: 6 .  metFORMIN (GLUCOPHAGE) 1000 MG tablet, TAKE 1 TABLET(1000 MG) BY MOUTH TWICE DAILY WITH A MEAL, Disp: 60 tablet, Rfl: 0 .  Oxycodone HCl 10 MG TABS, Take 10 mg by mouth 4 (four) times daily as needed (for pain)., Disp: , Rfl:  .  Potassium Chloride ER 20 MEQ TBCR, Take 40 mEq by mouth daily., Disp: 60 tablet, Rfl: 5 .  spironolactone (ALDACTONE) 25 MG tablet, TAKE 1 TABLET(25 MG) BY MOUTH DAILY, Disp: 30 tablet, Rfl: 3 .  Tafamidis (VYNDAMAX) 61 MG CAPS, Take 61 mg by mouth daily., Disp: 30 capsule, Rfl: 11 .  glucose blood (FREESTYLE TEST STRIPS) test strip, Use as instructed, Disp: 100 each, Rfl: 0 .  glucose monitoring kit (FREESTYLE) monitoring kit, 1 each by Does not apply route 4 (four) times daily - after meals and at bedtime. 1 month Diabetic Testing Supplies for QAC-QHS accuchecks., Disp: 1  each, Rfl: 1 .  Insulin Glargine (LANTUS SOLOSTAR) 100 UNIT/ML Solostar Pen, Inject 50 Units into the skin daily at 10 pm., Disp: 15 mL, Rfl: 0 .  Insulin Pen Needle 32G X 8 MM MISC, Use as directed, Disp: 100 each, Rfl: 0 .  Lancets (FREESTYLE) lancets, Use as instructed, Disp: 100 each, Rfl: 0 .  metroNIDAZOLE (FLAGYL) 500 MG tablet, TK 2 TS PO D, Disp: , Rfl:  .  naproxen sodium (ALEVE) 220 MG tablet, Take 220 mg by mouth daily as needed (for pain or headache). , Disp: , Rfl:  .  polyethylene glycol (MIRALAX / GLYCOLAX) packet, Take 17 g by mouth daily as needed for mild constipation. (Patient not taking: Reported on 03/25/2018), Disp: 14 each, Rfl: 0 .  valACYclovir (VALTREX) 1000 MG tablet, Take 1 tablet (1,000 mg total) by mouth 3 (three) times daily. (Patient not taking: Reported on 04/20/2018), Disp: 21 tablet, Rfl: 0 .  Vitamin D, Ergocalciferol, (DRISDOL) 1.25 MG (50000 UT) CAPS capsule, TK 1 C PO WEEKLY, Disp: , Rfl:  Allergies  Allergen Reactions  . Morphine And Related Nausea Only    Severe nausea  . Penicillins Other (See Comments)    Pt states she has had a pain in her leg since a penicillin injection 2 months ago (reported 08/31/17).  Has tolerated amoxicillin oral.  Has patient had a PCN reaction causing immediate rash, facial/tongue/throat swelling, SOB or lightheadedness with hypotension: No Has patient had a PCN reaction causing severe rash involving mucus membranes or skin necrosis: No Has patient had a PCN reaction that required hospitalization: No Has patient had a PCN reaction occurring within the last 10 years: Yes If a      Social History   Socioeconomic History  . Marital status: Single    Spouse name: Not on file  . Number of children: 4  . Years of education: 69  . Highest education level: Not on file  Occupational History  . Not on file  Social Needs  . Financial resource strain: Not hard at all  . Food insecurity:    Worry: Never true    Inability:  Never true  . Transportation needs:    Medical: No    Non-medical: No  Tobacco Use  . Smoking status: Former Smoker    Packs/day: 1.00    Years: 40.00    Pack years: 40.00    Types: Cigarettes    Start date: 06/23/1972    Last attempt to quit: 03/26/2012    Years since quitting: 6.1  . Smokeless tobacco: Never Used  Substance and Sexual Activity  . Alcohol use: No  . Drug use: No  . Sexual activity: Yes  Lifestyle  . Physical activity:    Days per week: Not on file    Minutes per session: Not on file  . Stress: Not on file  Relationships  .  Social connections:    Talks on phone: Not on file    Gets together: Not on file    Attends religious service: Not on file    Active member of club or organization: Not on file    Attends meetings of clubs or organizations: Not on file    Relationship status: Not on file  . Intimate partner violence:    Fear of current or ex partner: Not on file    Emotionally abused: Not on file    Physically abused: Not on file    Forced sexual activity: Not on file  Other Topics Concern  . Not on file  Social History Narrative  . Not on file    Physical Exam Cardiovascular:     Rate and Rhythm: Normal rate and regular rhythm.     Pulses: Normal pulses.  Pulmonary:     Effort: Pulmonary effort is normal.     Breath sounds: Normal breath sounds.  Musculoskeletal: Normal range of motion.     Right lower leg: No edema.     Left lower leg: No edema.  Skin:    General: Skin is warm and dry.     Capillary Refill: Capillary refill takes less than 2 seconds.  Neurological:     Mental Status: She is alert and oriented to person, place, and time.  Psychiatric:        Mood and Affect: Mood normal.         Future Appointments  Date Time Provider [place]  06/07/2018 11:05 AM [facility] DEVICE REMOTES [facility]  06/22/2018  9:00 AM Larey Dresser, MD [facility] None  07/06/2018  8:30 AM Bernarda Caffey, MD [facility] None     BP 95/61 (BP Location: Right Arm, Patient Position: Sitting, Cuff Size: Normal)   Pulse 96   Resp 16   Wt 223 lb 12.8 oz (101.5 kg)   SpO2 97%   BMI 35.05 kg/m   Weight yesterday- Did not weigh Last visit weight- 222.6 lb  Ms Hashemi was seen at home today and reported feeling well. She denied chest pain, SOB, headache, dizziness, orthopnea, cough or fever since our last visit. She stated she has been compliant with her medications and her weight has been stable. While preparing to fill her pillbox I found that she had missed a few days but was mostly taking her medications as directed. He medications were verified and her pillbox was refilled. I will follow up in two weeks.   [name], EMT 05/25/18  ACTION: Home visit completed Next visit planned for 2 weeks

## 2018-05-26 ENCOUNTER — Telehealth (HOSPITAL_COMMUNITY): Payer: Self-pay | Admitting: Licensed Clinical Social Worker

## 2018-05-26 NOTE — Telephone Encounter (Signed)
CSW reached out to pt to check in regarding food and medication status at this time. Patient has been receiving food through the [employer] program and states she has been enjoying the food and it has been a big help to her during this time.  Patient reports she has everything she needs- lives with her son who helps her but states she has been going to the store but using gloves and a mask when she does.  CSW encouraged pt to reach out with any concerns and will continue to follow and assist as needed  Jorge Ny, [place] Worker [facility] [phone]

## 2018-06-07 ENCOUNTER — Ambulatory Visit (INDEPENDENT_AMBULATORY_CARE_PROVIDER_SITE_OTHER): Payer: Medicare Other | Admitting: *Deleted

## 2018-06-07 ENCOUNTER — Other Ambulatory Visit: Payer: Self-pay

## 2018-06-07 DIAGNOSIS — I428 Other cardiomyopathies: Secondary | ICD-10-CM

## 2018-06-07 DIAGNOSIS — I5022 Chronic systolic (congestive) heart failure: Secondary | ICD-10-CM

## 2018-06-08 ENCOUNTER — Telehealth (HOSPITAL_COMMUNITY): Payer: Self-pay

## 2018-06-08 LAB — CUP PACEART REMOTE DEVICE CHECK
Battery Remaining Longevity: 26 mo
Battery Voltage: 2.92 V
Brady Statistic AP VP Percent: 0.02 %
Brady Statistic AP VS Percent: 0.01 %
Brady Statistic AS VP Percent: 98.81 %
Brady Statistic AS VS Percent: 1.16 %
Brady Statistic RA Percent Paced: 0.03 %
Brady Statistic RV Percent Paced: 14 %
Date Time Interrogation Session: 20200511082503
HighPow Impedance: 71 Ohm
Implantable Lead Implant Date: 20141023
Implantable Lead Implant Date: 20141023
Implantable Lead Implant Date: 20141023
Implantable Lead Location: 753858
Implantable Lead Location: 753859
Implantable Lead Location: 753860
Implantable Lead Model: 4298
Implantable Lead Model: 5076
Implantable Lead Model: 6935
Implantable Pulse Generator Implant Date: 20141023
Lead Channel Impedance Value: 304 Ohm
Lead Channel Impedance Value: 342 Ohm
Lead Channel Impedance Value: 361 Ohm
Lead Channel Impedance Value: 361 Ohm
Lead Channel Impedance Value: 399 Ohm
Lead Channel Impedance Value: 456 Ohm
Lead Channel Impedance Value: 513 Ohm
Lead Channel Impedance Value: 532 Ohm
Lead Channel Impedance Value: 551 Ohm
Lead Channel Impedance Value: 551 Ohm
Lead Channel Impedance Value: 589 Ohm
Lead Channel Impedance Value: 589 Ohm
Lead Channel Impedance Value: 608 Ohm
Lead Channel Pacing Threshold Amplitude: 0.375 V
Lead Channel Pacing Threshold Amplitude: 0.625 V
Lead Channel Pacing Threshold Amplitude: 0.875 V
Lead Channel Pacing Threshold Pulse Width: 0.4 ms
Lead Channel Pacing Threshold Pulse Width: 0.4 ms
Lead Channel Pacing Threshold Pulse Width: 0.4 ms
Lead Channel Sensing Intrinsic Amplitude: 14.875 mV
Lead Channel Sensing Intrinsic Amplitude: 14.875 mV
Lead Channel Sensing Intrinsic Amplitude: 3.125 mV
Lead Channel Sensing Intrinsic Amplitude: 3.125 mV
Lead Channel Setting Pacing Amplitude: 1.75 V
Lead Channel Setting Pacing Amplitude: 2.25 V
Lead Channel Setting Pacing Amplitude: 2.5 V
Lead Channel Setting Pacing Pulse Width: 0.4 ms
Lead Channel Setting Pacing Pulse Width: 0.4 ms
Lead Channel Setting Sensing Sensitivity: 0.45 mV

## 2018-06-08 NOTE — Telephone Encounter (Signed)
I called [name] to schedule an appointment. She stated she had medication in her pillbox through Thursday evening and asked that I come Friday. We agreed for me to come at 09:00 on Friday.

## 2018-06-10 ENCOUNTER — Other Ambulatory Visit (HOSPITAL_COMMUNITY): Payer: Self-pay

## 2018-06-10 MED ORDER — ATORVASTATIN CALCIUM 40 MG PO TABS: 40.0000 mg | ORAL_TABLET | Freq: Every day | ORAL | 11 refills | Status: DC

## 2018-06-11 ENCOUNTER — Other Ambulatory Visit (HOSPITAL_COMMUNITY): Payer: Self-pay

## 2018-06-11 NOTE — Progress Notes (Signed)
Paramedicine Encounter    Patient ID: [name], female    DOB: 1950/10/09, 68 y.o.   MRN: [id number]   Patient Care Team: Jeanella Anton, NP as PCP - General (Nurse Practitioner) Jorge Ny, LCSW as Social Worker (Licensed Clinical Social Worker)  Patient Active Problem List   Diagnosis Date Noted  . Right foot infection 10/22/2017  . Cellulitis in diabetic foot ([place]) 10/22/2017  . Hyperglycemia 10/22/2017  . Lumbar radiculopathy 09/18/2017  . Degenerative spondylolisthesis 09/18/2017  . Atrial fibrillation ([place]) 02/11/2017  . Paroxysmal A-fib ([place])   . Biventricular automatic implantable cardioverter defibrillator in situ   . Sepsis ([place]) 04/20/2016  . UTI (urinary tract infection) 04/20/2016  . Dyspnea 07/19/2015  . Interstitial lung disease ([place]) 11/20/2014  . SIRS (systemic inflammatory response syndrome) ([place]) 02/03/2014  . IDDM (insulin dependent diabetes mellitus) ([place]) 02/03/2014  . Tachycardia 06/14/2013  . Chronic systolic CHF (congestive heart failure) ([place]) 09/29/2012  . Hypoxia 03/06/2012  . Hypertensive heart disease 03/06/2012  . Nonischemic cardiomyopathy ([place]) 03/06/2012  . Tobacco abuse 03/06/2012  . Type 2 diabetes mellitus ([place]) 03/06/2012  . Hypertension 03/06/2012  . CAD (coronary artery disease), native coronary artery 03/06/2012  . Hypokalemia 03/06/2012  . Acute bronchitis 02/01/2009  . Sleep apnea 02/01/2009  . Chest pain 02/01/2009    Current Outpatient Medications:  .  acetaminophen (TYLENOL) 500 MG tablet, Take 1 tablet (500 mg total) by mouth every 6 (six) hours as needed., Disp: 30 tablet, Rfl: 0 .  albuterol (PROVENTIL HFA;VENTOLIN HFA) 108 (90 Base) MCG/ACT inhaler, Inhale 2 puffs into the lungs every 6 (six) hours as needed for wheezing or shortness of breath., Disp: 18 g, Rfl: 0 .  apixaban (ELIQUIS) 5 MG TABS tablet, Take 1 tablet (5 mg total) by mouth 2 (two) times daily., Disp: 60 tablet, Rfl: 3 .  atorvastatin (LIPITOR) 40  MG tablet, Take 1 tablet (40 mg total) by mouth daily., Disp: 30 tablet, Rfl: 11 .  baclofen (LIORESAL) 10 MG tablet, TK 1 T PO BID PRN, Disp: , Rfl:  .  carvedilol (COREG) 25 MG tablet, TAKE 1 TABLET(25 MG) BY MOUTH TWICE DAILY WITH A MEAL, Disp: 180 tablet, Rfl: 3 .  ENTRESTO 97-103 MG, TAKE 1 TABLET BY MOUTH TWICE DAILY, Disp: 60 tablet, Rfl: 6 .  furosemide (LASIX) 40 MG tablet, Take 1 tablet (40 mg total) by mouth 2 (two) times daily., Disp: 180 tablet, Rfl: 3 .  gabapentin (NEURONTIN) 100 MG capsule, TK 1 C PO TID, Disp: , Rfl:  .  insulin aspart (NOVOLOG FLEXPEN) 100 UNIT/ML FlexPen, 0-20 Units, Subcutaneous, 3 times daily with meals CBG < 70: implement hypoglycemia protocol CBG 70 - 120: 0 units CBG 121 - 150: 3 units CBG 151 - 200: 4 units CBG 201 - 250: 7 units CBG 251 - 300: 11 units CBG 301 - 350: 15 units CBG 351 - 400: 20 units CBG > 400: call MD, Disp: 15 mL, Rfl: 0 .  Insulin Glargine (LANTUS SOLOSTAR) 100 UNIT/ML Solostar Pen, Inject 50 Units into the skin daily at 10 pm., Disp: 15 mL, Rfl: 0 .  isosorbide-hydrALAZINE (BIDIL) 20-37.5 MG tablet, Take 1 tablet by mouth 3 (three) times daily., Disp: 90 tablet, Rfl: 6 .  naproxen sodium (ALEVE) 220 MG tablet, Take 220 mg by mouth daily as needed (for pain or headache). , Disp: , Rfl:  .  Oxycodone HCl 10 MG TABS, Take 10 mg by mouth 4 (four) times daily as needed (for  pain)., Disp: , Rfl:  .  Potassium Chloride ER 20 MEQ TBCR, Take 40 mEq by mouth daily., Disp: 60 tablet, Rfl: 5 .  spironolactone (ALDACTONE) 25 MG tablet, TAKE 1 TABLET(25 MG) BY MOUTH DAILY, Disp: 30 tablet, Rfl: 3 .  Tafamidis (VYNDAMAX) 61 MG CAPS, Take 61 mg by mouth daily., Disp: 30 capsule, Rfl: 11 .  glucose blood (FREESTYLE TEST STRIPS) test strip, Use as instructed, Disp: 100 each, Rfl: 0 .  glucose monitoring kit (FREESTYLE) monitoring kit, 1 each by Does not apply route 4 (four) times daily - after meals and at bedtime. 1 month Diabetic Testing Supplies for  QAC-QHS accuchecks., Disp: 1 each, Rfl: 1 .  HUMALOG KWIKPEN 100 UNIT/ML KwikPen, INJECT 10 UNITS INTO THE SKIN WITH EACH MEAL, Disp: , Rfl:  .  Insulin Pen Needle 32G X 8 MM MISC, Use as directed, Disp: 100 each, Rfl: 0 .  Lancets (FREESTYLE) lancets, Use as instructed, Disp: 100 each, Rfl: 0 .  metFORMIN (GLUCOPHAGE) 1000 MG tablet, TAKE 1 TABLET(1000 MG) BY MOUTH TWICE DAILY WITH A MEAL, Disp: 60 tablet, Rfl: 0 .  metroNIDAZOLE (FLAGYL) 500 MG tablet, TK 2 TS PO D, Disp: , Rfl:  .  polyethylene glycol (MIRALAX / GLYCOLAX) packet, Take 17 g by mouth daily as needed for mild constipation. (Patient not taking: Reported on 03/25/2018), Disp: 14 each, Rfl: 0 .  valACYclovir (VALTREX) 1000 MG tablet, Take 1 tablet (1,000 mg total) by mouth 3 (three) times daily. (Patient not taking: Reported on 04/20/2018), Disp: 21 tablet, Rfl: 0 .  Vitamin D, Ergocalciferol, (DRISDOL) 1.25 MG (50000 UT) CAPS capsule, TK 1 C PO WEEKLY, Disp: , Rfl:  Allergies  Allergen Reactions  . Morphine And Related Nausea Only    Severe nausea  . Penicillins Other (See Comments)    Pt states she has had a pain in her leg since a penicillin injection 2 months ago (reported 08/31/17).  Has tolerated amoxicillin oral.  Has patient had a PCN reaction causing immediate rash, facial/tongue/throat swelling, SOB or lightheadedness with hypotension: No Has patient had a PCN reaction causing severe rash involving mucus membranes or skin necrosis: No Has patient had a PCN reaction that required hospitalization: No Has patient had a PCN reaction occurring within the last 10 years: Yes If a      Social History   Socioeconomic History  . Marital status: Single    Spouse name: Not on file  . Number of children: 4  . Years of education: 16  . Highest education level: Not on file  Occupational History  . Not on file  Social Needs  . Financial resource strain: Not hard at all  . Food insecurity:    Worry: Never true    Inability:  Never true  . Transportation needs:    Medical: No    Non-medical: No  Tobacco Use  . Smoking status: Former Smoker    Packs/day: 1.00    Years: 40.00    Pack years: 40.00    Types: Cigarettes    Start date: 06/23/1972    Last attempt to quit: 03/26/2012    Years since quitting: 6.2  . Smokeless tobacco: Never Used  Substance and Sexual Activity  . Alcohol use: No  . Drug use: No  . Sexual activity: Yes  Lifestyle  . Physical activity:    Days per week: Not on file    Minutes per session: Not on file  . Stress: Not on file  Relationships  .  Social connections:    Talks on phone: Not on file    Gets together: Not on file    Attends religious service: Not on file    Active member of club or organization: Not on file    Attends meetings of clubs or organizations: Not on file    Relationship status: Not on file  . Intimate partner violence:    Fear of current or ex partner: Not on file    Emotionally abused: Not on file    Physically abused: Not on file    Forced sexual activity: Not on file  Other Topics Concern  . Not on file  Social History Narrative  . Not on file    Physical Exam Cardiovascular:     Rate and Rhythm: Normal rate and regular rhythm.     Pulses: Normal pulses.  Pulmonary:     Effort: Pulmonary effort is normal.     Breath sounds: Normal breath sounds.  Musculoskeletal: Normal range of motion.     Right lower leg: No edema.     Left lower leg: No edema.  Skin:    General: Skin is warm and dry.     Capillary Refill: Capillary refill takes less than 2 seconds.  Neurological:     Mental Status: She is alert and oriented to person, place, and time.  Psychiatric:        Mood and Affect: Mood normal.         Future Appointments  Date Time Provider [place]  06/22/2018  9:00 AM Larey Dresser, MD [facility] None  07/06/2018  8:30 AM Bernarda Caffey, MD [facility] None    BP 122/67 (BP Location: Right Arm, Patient Position: Sitting, Cuff  Size: Normal)   Pulse 80   Resp 16   Wt 227 lb 12.8 oz (103.3 kg)   SpO2 98%   BMI 35.68 kg/m   Weight yesterday- Did not weigh Last visit weight- 223 lb  Ms Stanaland was seen at home today and reported feeling well. She denied chest pain, SOB, headache, dizziness, orthopnea, cough or fever. She stated she has been compliant with medications over the past two weeks and her weight has been stable. Her medications were verified and her pillbox was refilled. She will need a visit next week to finish filling her pillbox after she picks up atorvastatin and metformin. I contacted the pharmacy who stated they were working on those prescriptions and they should be ready by early next week. Ms Mcconico was understanding and agreeable.  [name], EMT 06/11/18  ACTION: Home visit completed Next visit planned for 1 week

## 2018-06-16 NOTE — Progress Notes (Signed)
Remote ICD transmission.   

## 2018-06-22 ENCOUNTER — Encounter (HOSPITAL_COMMUNITY): Payer: Medicare Other | Admitting: Cardiology

## 2018-06-24 ENCOUNTER — Telehealth (HOSPITAL_COMMUNITY): Payer: Self-pay

## 2018-06-24 NOTE — Telephone Encounter (Signed)
I called [name] to schedule an appointment. She stated she would not be home today but asked me to come tomorrow morning. We agreed to meet at 08:30.

## 2018-06-25 ENCOUNTER — Other Ambulatory Visit (HOSPITAL_COMMUNITY): Payer: Self-pay

## 2018-06-25 NOTE — Progress Notes (Signed)
Paramedicine Encounter    Patient ID: [name], female    DOB: Jul 01, 1950, 68 y.o.   MRN: [id number]   Patient Care Team: Jeanella Anton, NP as PCP - General (Nurse Practitioner) Jorge Ny, LCSW as Social Worker (Licensed Clinical Social Worker)  Patient Active Problem List   Diagnosis Date Noted  . Right foot infection 10/22/2017  . Cellulitis in diabetic foot ([place]) 10/22/2017  . Hyperglycemia 10/22/2017  . Lumbar radiculopathy 09/18/2017  . Degenerative spondylolisthesis 09/18/2017  . Atrial fibrillation ([place]) 02/11/2017  . Paroxysmal A-fib ([place])   . Biventricular automatic implantable cardioverter defibrillator in situ   . Sepsis ([place]) 04/20/2016  . UTI (urinary tract infection) 04/20/2016  . Dyspnea 07/19/2015  . Interstitial lung disease ([place]) 11/20/2014  . SIRS (systemic inflammatory response syndrome) ([place]) 02/03/2014  . IDDM (insulin dependent diabetes mellitus) ([place]) 02/03/2014  . Tachycardia 06/14/2013  . Chronic systolic CHF (congestive heart failure) ([place]) 09/29/2012  . Hypoxia 03/06/2012  . Hypertensive heart disease 03/06/2012  . Nonischemic cardiomyopathy ([place]) 03/06/2012  . Tobacco abuse 03/06/2012  . Type 2 diabetes mellitus ([place]) 03/06/2012  . Hypertension 03/06/2012  . CAD (coronary artery disease), native coronary artery 03/06/2012  . Hypokalemia 03/06/2012  . Acute bronchitis 02/01/2009  . Sleep apnea 02/01/2009  . Chest pain 02/01/2009    Current Outpatient Medications:  .  acetaminophen (TYLENOL) 500 MG tablet, Take 1 tablet (500 mg total) by mouth every 6 (six) hours as needed., Disp: 30 tablet, Rfl: 0 .  albuterol (PROVENTIL HFA;VENTOLIN HFA) 108 (90 Base) MCG/ACT inhaler, Inhale 2 puffs into the lungs every 6 (six) hours as needed for wheezing or shortness of breath., Disp: 18 g, Rfl: 0 .  apixaban (ELIQUIS) 5 MG TABS tablet, Take 1 tablet (5 mg total) by mouth 2 (two) times daily., Disp: 60 tablet, Rfl: 3 .  baclofen (LIORESAL) 10 MG  tablet, TK 1 T PO BID PRN, Disp: , Rfl:  .  carvedilol (COREG) 25 MG tablet, TAKE 1 TABLET(25 MG) BY MOUTH TWICE DAILY WITH A MEAL, Disp: 180 tablet, Rfl: 3 .  ENTRESTO 97-103 MG, TAKE 1 TABLET BY MOUTH TWICE DAILY, Disp: 60 tablet, Rfl: 6 .  furosemide (LASIX) 40 MG tablet, Take 1 tablet (40 mg total) by mouth 2 (two) times daily., Disp: 180 tablet, Rfl: 3 .  gabapentin (NEURONTIN) 100 MG capsule, TK 1 C PO TID, Disp: , Rfl:  .  insulin aspart (NOVOLOG FLEXPEN) 100 UNIT/ML FlexPen, 0-20 Units, Subcutaneous, 3 times daily with meals CBG < 70: implement hypoglycemia protocol CBG 70 - 120: 0 units CBG 121 - 150: 3 units CBG 151 - 200: 4 units CBG 201 - 250: 7 units CBG 251 - 300: 11 units CBG 301 - 350: 15 units CBG 351 - 400: 20 units CBG > 400: call MD, Disp: 15 mL, Rfl: 0 .  Insulin Glargine (LANTUS SOLOSTAR) 100 UNIT/ML Solostar Pen, Inject 50 Units into the skin daily at 10 pm., Disp: 15 mL, Rfl: 0 .  isosorbide-hydrALAZINE (BIDIL) 20-37.5 MG tablet, Take 1 tablet by mouth 3 (three) times daily., Disp: 90 tablet, Rfl: 6 .  metFORMIN (GLUCOPHAGE) 1000 MG tablet, TAKE 1 TABLET(1000 MG) BY MOUTH TWICE DAILY WITH A MEAL, Disp: 60 tablet, Rfl: 0 .  Oxycodone HCl 10 MG TABS, Take 10 mg by mouth 4 (four) times daily as needed (for pain)., Disp: , Rfl:  .  Potassium Chloride ER 20 MEQ TBCR, Take 40 mEq by mouth daily., Disp: 60 tablet, Rfl:  5 .  spironolactone (ALDACTONE) 25 MG tablet, TAKE 1 TABLET(25 MG) BY MOUTH DAILY, Disp: 30 tablet, Rfl: 3 .  Tafamidis (VYNDAMAX) 61 MG CAPS, Take 61 mg by mouth daily., Disp: 30 capsule, Rfl: 11 .  atorvastatin (LIPITOR) 40 MG tablet, Take 1 tablet (40 mg total) by mouth daily., Disp: 30 tablet, Rfl: 11 .  glucose blood (FREESTYLE TEST STRIPS) test strip, Use as instructed, Disp: 100 each, Rfl: 0 .  glucose monitoring kit (FREESTYLE) monitoring kit, 1 each by Does not apply route 4 (four) times daily - after meals and at bedtime. 1 month Diabetic Testing Supplies for  QAC-QHS accuchecks., Disp: 1 each, Rfl: 1 .  HUMALOG KWIKPEN 100 UNIT/ML KwikPen, INJECT 10 UNITS INTO THE SKIN WITH EACH MEAL, Disp: , Rfl:  .  Insulin Pen Needle 32G X 8 MM MISC, Use as directed, Disp: 100 each, Rfl: 0 .  Lancets (FREESTYLE) lancets, Use as instructed, Disp: 100 each, Rfl: 0 .  metroNIDAZOLE (FLAGYL) 500 MG tablet, TK 2 TS PO D, Disp: , Rfl:  .  naproxen sodium (ALEVE) 220 MG tablet, Take 220 mg by mouth daily as needed (for pain or headache). , Disp: , Rfl:  .  polyethylene glycol (MIRALAX / GLYCOLAX) packet, Take 17 g by mouth daily as needed for mild constipation. (Patient not taking: Reported on 03/25/2018), Disp: 14 each, Rfl: 0 .  valACYclovir (VALTREX) 1000 MG tablet, Take 1 tablet (1,000 mg total) by mouth 3 (three) times daily. (Patient not taking: Reported on 04/20/2018), Disp: 21 tablet, Rfl: 0 .  Vitamin D, Ergocalciferol, (DRISDOL) 1.25 MG (50000 UT) CAPS capsule, TK 1 C PO WEEKLY, Disp: , Rfl:  Allergies  Allergen Reactions  . Morphine And Related Nausea Only    Severe nausea  . Penicillins Other (See Comments)    Pt states she has had a pain in her leg since a penicillin injection 2 months ago (reported 08/31/17).  Has tolerated amoxicillin oral.  Has patient had a PCN reaction causing immediate rash, facial/tongue/throat swelling, SOB or lightheadedness with hypotension: No Has patient had a PCN reaction causing severe rash involving mucus membranes or skin necrosis: No Has patient had a PCN reaction that required hospitalization: No Has patient had a PCN reaction occurring within the last 10 years: Yes If a      Social History   Socioeconomic History  . Marital status: Single    Spouse name: Not on file  . Number of children: 4  . Years of education: 100  . Highest education level: Not on file  Occupational History  . Not on file  Social Needs  . Financial resource strain: Not hard at all  . Food insecurity:    Worry: Never true    Inability: Never  true  . Transportation needs:    Medical: No    Non-medical: No  Tobacco Use  . Smoking status: Former Smoker    Packs/day: 1.00    Years: 40.00    Pack years: 40.00    Types: Cigarettes    Start date: 06/23/1972    Last attempt to quit: 03/26/2012    Years since quitting: 6.2  . Smokeless tobacco: Never Used  Substance and Sexual Activity  . Alcohol use: No  . Drug use: No  . Sexual activity: Yes  Lifestyle  . Physical activity:    Days per week: Not on file    Minutes per session: Not on file  . Stress: Not on file  Relationships  .  Social connections:    Talks on phone: Not on file    Gets together: Not on file    Attends religious service: Not on file    Active member of club or organization: Not on file    Attends meetings of clubs or organizations: Not on file    Relationship status: Not on file  . Intimate partner violence:    Fear of current or ex partner: Not on file    Emotionally abused: Not on file    Physically abused: Not on file    Forced sexual activity: Not on file  Other Topics Concern  . Not on file  Social History Narrative  . Not on file    Physical Exam Cardiovascular:     Rate and Rhythm: Normal rate and regular rhythm.     Pulses: Normal pulses.  Pulmonary:     Effort: Pulmonary effort is normal.     Breath sounds: Normal breath sounds.  Musculoskeletal: Normal range of motion.     Right lower leg: Edema present.     Left lower leg: Edema present.  Skin:    General: Skin is warm and dry.     Capillary Refill: Capillary refill takes less than 2 seconds.  Neurological:     Mental Status: She is alert and oriented to person, place, and time.  Psychiatric:        Mood and Affect: Mood normal.         Future Appointments  Date Time Provider [place]  07/06/2018  8:30 AM Bernarda Caffey, MD [facility] None  08/23/2018  9:20 AM Larey Dresser, MD [facility] None  09/06/2018  7:35 AM [facility] DEVICE REMOTES [facility]     BP (!) 101/54 (BP Location: Left Arm, Patient Position: Sitting, Cuff Size: Normal)   Pulse 97   Resp 16   Wt 226 lb 8 oz (102.7 kg)   SpO2 97%   BMI 35.47 kg/m   Weight yesterday- 226 lb Last visit weight- 227.6 lb  [name] was seen at home today and reported feeling well. She denied chest pain, SOB, headache, dizziness, orthopnea, cough, or fever since our last visit. She stated she has been compliant with her medications which appeared to be mostly correct. Her medications were verified and her pillbox was refilled with the exception of atorvastatin. I contacted the pharmacy who stated her prescription would be ready within the next hour. [name] said she would pick it up this afternoon and would be able to put it into her pillbox. I will follow up in two weeks.   [name], EMT 06/25/18  ACTION: Home visit completed Next visit planned for 1 week

## 2018-07-05 NOTE — Progress Notes (Addendum)
[facility] Note  07/06/2018     CHIEF COMPLAINT Patient presents for Retina Evaluation   HISTORY OF PRESENT ILLNESS: [name] is a 68 y.o. female who presents to the clinic today for:   HPI    Retina Evaluation    In both eyes.  This started months ago.  Duration of months.  Context:  distance vision.  I, the attending physician,  performed the HPI with the patient and updated documentation appropriately.          Comments     Patient states her vision is stable from her last visit.  She denies eye pain or discomfort and denies any new or worsening floaters or fol OU.       Last edited by Bernarda Caffey, MD on 07/06/2018  9:33 AM. (History)    pt states she has had cataract sx on her right eye on Jun 10, 2018 with Dr. Shirleen Schirmer, she states she sees him next on Friday, pt states she is still on drops from sx   Referring physician: Jeanella Anton, NP [address] [place], [place] [address]  HISTORICAL INFORMATION:   Selected notes from the MEDICAL RECORD NUMBER Referred by Dr. Thurston Hole for concern of BRVO OD / ERM OD LEE: 02.10.20 (S. Bernstorf) [BCVA: OD: 20/25- OS: 20/25] Ocular Hx-DES OU, narrow angles PMH-DM (metformin, novolin, lantus), HTN, HLD, heart disease    CURRENT MEDICATIONS: No current outpatient medications on file. (Ophthalmic Drugs)   No current facility-administered medications for this visit.  (Ophthalmic Drugs)   Current Outpatient Medications (Other)  Medication Sig  . acetaminophen (TYLENOL) 500 MG tablet Take 1 tablet (500 mg total) by mouth every 6 (six) hours as needed.  Marland Kitchen albuterol (PROVENTIL HFA;VENTOLIN HFA) 108 (90 Base) MCG/ACT inhaler Inhale 2 puffs into the lungs every 6 (six) hours as needed for wheezing or shortness of breath.  Marland Kitchen apixaban (ELIQUIS) 5 MG TABS tablet Take 1 tablet (5 mg total) by mouth 2 (two) times daily.  Marland Kitchen atorvastatin (LIPITOR) 40 MG tablet Take 1 tablet (40 mg  total) by mouth daily.  . baclofen (LIORESAL) 10 MG tablet TK 1 T PO BID PRN  . carvedilol (COREG) 25 MG tablet TAKE 1 TABLET(25 MG) BY MOUTH TWICE DAILY WITH A MEAL  . ENTRESTO 97-103 MG TAKE 1 TABLET BY MOUTH TWICE DAILY  . furosemide (LASIX) 40 MG tablet Take 1 tablet (40 mg total) by mouth 2 (two) times daily.  Marland Kitchen gabapentin (NEURONTIN) 100 MG capsule TK 1 C PO TID  . glucose blood (FREESTYLE TEST STRIPS) test strip Use as instructed  . glucose monitoring kit (FREESTYLE) monitoring kit 1 each by Does not apply route 4 (four) times daily - after meals and at bedtime. 1 month Diabetic Testing Supplies for QAC-QHS accuchecks.  Marland Kitchen HUMALOG KWIKPEN 100 UNIT/ML KwikPen INJECT 10 UNITS INTO THE SKIN WITH EACH MEAL  . insulin aspart (NOVOLOG FLEXPEN) 100 UNIT/ML FlexPen 0-20 Units, Subcutaneous, 3 times daily with meals CBG < 70: implement hypoglycemia protocol CBG 70 - 120: 0 units CBG 121 - 150: 3 units CBG 151 - 200: 4 units CBG 201 - 250: 7 units CBG 251 - 300: 11 units CBG 301 - 350: 15 units CBG 351 - 400: 20 units CBG > 400: call MD  . Insulin Glargine (LANTUS SOLOSTAR) 100 UNIT/ML Solostar Pen Inject 50 Units into the skin daily at 10 pm.  . Insulin Pen Needle  32G X 8 MM MISC Use as directed  . isosorbide-hydrALAZINE (BIDIL) 20-37.5 MG tablet Take 1 tablet by mouth 3 (three) times daily.  . Lancets (FREESTYLE) lancets Use as instructed  . metFORMIN (GLUCOPHAGE) 1000 MG tablet TAKE 1 TABLET(1000 MG) BY MOUTH TWICE DAILY WITH A MEAL  . metroNIDAZOLE (FLAGYL) 500 MG tablet TK 2 TS PO D  . naproxen sodium (ALEVE) 220 MG tablet Take 220 mg by mouth daily as needed (for pain or headache).   . Oxycodone HCl 10 MG TABS Take 10 mg by mouth 4 (four) times daily as needed (for pain).  . polyethylene glycol (MIRALAX / GLYCOLAX) packet Take 17 g by mouth daily as needed for mild constipation. (Patient not taking: Reported on 03/25/2018)  . Potassium Chloride ER 20 MEQ TBCR Take 40 mEq by mouth daily.   Marland Kitchen spironolactone (ALDACTONE) 25 MG tablet TAKE 1 TABLET(25 MG) BY MOUTH DAILY  . Tafamidis (VYNDAMAX) 61 MG CAPS Take 61 mg by mouth daily.  . valACYclovir (VALTREX) 1000 MG tablet Take 1 tablet (1,000 mg total) by mouth 3 (three) times daily. (Patient not taking: Reported on 04/20/2018)  . Vitamin D, Ergocalciferol, (DRISDOL) 1.25 MG (50000 UT) CAPS capsule TK 1 C PO WEEKLY   No current facility-administered medications for this visit.  (Other)      REVIEW OF SYSTEMS: ROS    Positive for: Endocrine, Cardiovascular, Eyes, Respiratory   Negative for: Constitutional, Gastrointestinal, Neurological, Skin, Genitourinary, Musculoskeletal, HENT, Psychiatric, Allergic/Imm, Heme/Lymph   Last edited by Doneen Poisson on 07/06/2018  8:29 AM. (History)       ALLERGIES Allergies  Allergen Reactions  . Morphine And Related Nausea Only    Severe nausea  . Penicillins Other (See Comments)    Pt states she has had a pain in her leg since a penicillin injection 2 months ago (reported 08/31/17).  Has tolerated amoxicillin oral.  Has patient had a PCN reaction causing immediate rash, facial/tongue/throat swelling, SOB or lightheadedness with hypotension: No Has patient had a PCN reaction causing severe rash involving mucus membranes or skin necrosis: No Has patient had a PCN reaction that required hospitalization: No Has patient had a PCN reaction occurring within the last 10 years: Yes If a    PAST MEDICAL HISTORY Past Medical History:  Diagnosis Date  . Anemia    a. Noted on 07/2012 labs, instructed to f/u PCP.  Marland Kitchen Arthritis    "joints" (11/18/2012)  . CAD (coronary artery disease), native coronary artery    a. Nonobstructive by cath 02/2012 (done because of low EF).  . Chronic bronchitis ([place])    "~ every other year" (11/18/2012)  . Chronic combined systolic and diastolic CHF (congestive heart failure) ([place])    a. 03/05/12 echo:  LVEF 20-25%, moderate LVH , inferior and basal to mid septal  akinesis, anterior moderate to severe hypokinesis and grade 2 diastolic dysfunction. b. EF 07/2012: EF still 25% (unclear medication compliance).  . Chronic lower back pain   . Headache(784.0)    "often; maybe not daily" (11/18/2012)  . High cholesterol   . History of noncompliance with medical treatment   . Hypertension   . LBBB (left bundle branch block)   . Orthopnea   . Tobacco abuse   . Type II diabetes mellitus ([place])    Past Surgical History:  Procedure Laterality Date  . BI-VENTRICULAR IMPLANTABLE CARDIOVERTER DEFIBRILLATOR N/A 11/18/2012   Procedure: BI-VENTRICULAR IMPLANTABLE CARDIOVERTER DEFIBRILLATOR  (CRT-D);  Surgeon: Evans Lance, MD;  Location:  [facility];  Service: Cardiovascular;  Laterality: N/A;  . BI-VENTRICULAR IMPLANTABLE CARDIOVERTER DEFIBRILLATOR  (CRT-D)  11/18/2012  . CARDIAC CATHETERIZATION  03/04/12   nonobstructive CAD, elevated LVEDP and tortuous vessels suggestive of long-standing hypertension  . COLONOSCOPY WITH PROPOFOL N/A 12/12/2016   Procedure: COLONOSCOPY WITH PROPOFOL;  Surgeon: Carol Ada, MD;  Location: [facility] ENDOS[facility];  Service: Endoscopy;  Laterality: N/A;  . JOINT REPLACEMENT     Bilateral hip and right knee  . LEFT HEART CATH N/A 03/05/2012   Procedure: LEFT HEART CATH;  Surgeon: Larey Dresser, MD;  Location: [facility] CATH LAB;  Service: Cardiovascular;  Laterality: N/A;    FAMILY HISTORY Family History  Problem Relation Age of Onset  . Heart disease Neg Hx     SOCIAL HISTORY Social History   Tobacco Use  . Smoking status: Former Smoker    Packs/day: 1.00    Years: 40.00    Pack years: 40.00    Types: Cigarettes    Start date: 06/23/1972    Last attempt to quit: 03/26/2012    Years since quitting: 6.2  . Smokeless tobacco: Never Used  Substance Use Topics  . Alcohol use: No  . Drug use: No         OPHTHALMIC EXAM:  Base Eye Exam    Visual Acuity (Snellen - Linear)      Right Left   Dist [place] 20/40 -2 20/150 -2   Dist ph [place]  20/30 -1 20/30 +2       Tonometry (Tonopen, 8:34 AM)      Right Left   Pressure 17 17       Pupils      Dark Light Shape React APD   Right 2 1 Round Minimal 0   Left 2 1 Round Minimal 0       Extraocular Movement      Right Left    Full Full       Neuro/Psych    Oriented x3:  Yes   Mood/Affect:  Normal       Dilation    Both eyes:  1.0% Mydriacyl, 2.5% Phenylephrine @ 8:34 AM        Slit Lamp and Fundus Exam    Slit Lamp Exam      Right Left   Lids/Lashes Dermatochalasis - upper lid Dermatochalasis - upper lid   Conjunctiva/Sclera nasal Pinguecula, Melanosis nasal/temporal Pinguecula, Melanosis   Cornea 2+ Punctate epithelial erosions, Well healed  IT cataract wounds 1-2+ Punctate epithelial erosions   Anterior Chamber deep 0.5+pigment deep and clear   Iris Round and dilated, No NVI Round and dilated, No NVI   Lens PC IOL in good position 2-3+ Nuclear sclerosis, 2-3+ Cortical cataract   Vitreous Vitreous syneresis, mild cell/pigment on anterior vitreous Vitreous syneresis       Fundus Exam      Right Left   Disc +cupping, mild tilt, temporal PPA sharp rim, +cupping, temporal PPA   C/D Ratio 0.6 0.8   Macula Blunted foveal reflex, 3-4+ERM, no heme Good foveal reflex, rare MA   Vessels Vascular attenuation, veins dilated/tortuous, AV crossing changes, white, sclerotic arteriole / BRAO at 1200 - good laser surrounding Vascular attenuation, Tortuous   Periphery Attached, fibrotic NV at 1200 - partially regressed, ?seafan, pigmented cystoid degeneration at 0600 - good early segmental PRP laser changes in area of BRAO and NV Attached             IMAGING AND PROCEDURES  Imaging and  Procedures for '@TODAY' @  OCT, Retina - OU - Both Eyes       Right Eye Quality was good. Central Foveal Thickness: 421. Progression has been stable. Findings include abnormal foveal contour, epiretinal membrane, no IRF, no SRF, preretinal fibrosis, macular pucker (Thick ERM - no change  from prior).   Left Eye Quality was good. Central Foveal Thickness: 240. Progression has been stable. Findings include normal foveal contour, no SRF, no IRF, vitreomacular adhesion .   Notes *Images captured and stored on drive  Diagnosis / Impression:  OD: thick ERM with macular pucker -- stable from prior OS: NFP, no IRF/SRF, mild VMA   Clinical management:  See below  Abbreviations: NFP - Normal foveal profile. CME - cystoid macular edema. PED - pigment epithelial detachment. IRF - intraretinal fluid. SRF - subretinal fluid. EZ - ellipsoid zone. ERM - epiretinal membrane. ORA - outer retinal atrophy. ORT - outer retinal tubulation. SRHM - subretinal hyper-reflective material                 ASSESSMENT/PLAN:    ICD-10-CM   1. Proliferative retinopathy of right eye H35.21   2. BRAO (branch retinal artery occlusion), right H34.231   3. Epiretinal membrane (ERM) of right eye H35.371   4. Retinal edema H35.81 OCT, Retina - OU - Both Eyes  5. Moderate nonproliferative diabetic retinopathy of both eyes without macular edema associated with type 2 diabetes mellitus ([place]) Y63.7858   6. Essential hypertension I10   7. Hypertensive retinopathy of both eyes H35.033   8. 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 Pseudophakia Z96.1     1,2. Proliferative retinopathy and BRAO OD  - sclerotic superior arteriole with distal fibrotic NV (?sea fan) and overlying preretinal and vitreous heme  - etiology could also be DM2 vs sickle cell retinopathy -- sickle cell screen negative  - discussed findings, prognosis and treatment options  - s/p segmental PRP OD (2.17.2020) - good laser changes in place   - some hemorrhagic/NV activity -- may benefit from some fill in laser PRP OD  - improved view post cataract surgery  - f/u 4-6 weeks, DFE/OCT/FA (optos, transit OD) possible fill in PRP  3,4. Epiretinal membrane, OD  - fairly thick ERM overlying retina with pucker  and distorted foveal profile  - OCT stable from prior, but  BCVA OD drastically improved to 20/30 from 20/60 post cataract surgery  - discussed finding and prognosis and treatment options in detail  - pt happy with vision OD and wishes to monitor ERM for now  - f/u 4-6 weeks  5. Moderate nonproliferative diabetic retinopathy w/o DME, OS  - exam shows scattered MA OS, no NV  - FA (02.17.20) shows no NV OS  - OCT without diabetic macular edema, OU  - monitor  6,7. Hypertensive retinopathy OU  - discussed importance of tight BP control  - monitor  8. Mixed form age related cataract OS  - The symptoms of cataract, surgical options, and treatments and risks were discussed with patient.  - discussed diagnosis and progression  - under the expert management of Dr. Loletha Grayer. [place]  9. Pseudophakia OD  - s/p CE/IOL (Dr. Shirleen Schirmer, Jun 10, 2018)  - beautiful surgery, doing well  - completing drop taper per Dr. Katy Fitch  - monitor     Ophthalmic Meds Ordered this visit:  No orders of the defined types were placed in this encounter.      Return for  f/u 4-6 weeks, PDR OU, DFE, OCT, FA (optos, transit OD), possible laser OD.  There are no Patient Instructions on file for this visit.   Explained the diagnoses, plan, and follow up with the patient and they expressed understanding.  Patient expressed understanding of the importance of proper follow up care.   This document serves as a record of services personally performed by Gardiner Sleeper, MD, PhD. It was created on their behalf by Ernest Mallick, OA, an ophthalmic assistant. The creation of this record is the provider's dictation and/or activities during the visit.    Electronically signed by: Ernest Mallick, OA  06.08.2020 12:41 PM    Gardiner Sleeper, M.D., Ph.D. Diseases & Surgery of the Retina and Vitreous [facility]  I have reviewed the above documentation for accuracy and completeness, and I agree with the above.  Gardiner Sleeper, M.D., Ph.D. 07/06/18 12:41 PM    Abbreviations: M myopia (nearsighted); A astigmatism; H hyperopia (farsighted); P presbyopia; Mrx spectacle prescription;  CTL contact lenses; OD right eye; OS left eye; OU both eyes  XT exotropia; ET esotropia; PEK punctate epithelial keratitis; PEE punctate epithelial erosions; DES dry eye syndrome; MGD meibomian gland dysfunction; ATs artificial tears; PFAT's preservative free artificial tears; [place] nuclear sclerotic cataract; PSC posterior subcapsular cataract; ERM epi-retinal membrane; PVD posterior vitreous detachment; RD retinal detachment; DM diabetes mellitus; DR diabetic retinopathy; NPDR non-proliferative diabetic retinopathy; PDR proliferative diabetic retinopathy; CSME clinically significant macular edema; DME diabetic macular edema; dbh dot blot hemorrhages; CWS cotton wool spot; POAG primary open angle glaucoma; C/D cup-to-disc ratio; HVF humphrey visual field; GVF goldmann visual field; OCT optical coherence tomography; IOP intraocular pressure; BRVO Branch retinal vein occlusion; CRVO central retinal vein occlusion; CRAO central retinal artery occlusion; BRAO branch retinal artery occlusion; RT retinal tear; SB scleral buckle; PPV pars plana vitrectomy; VH Vitreous hemorrhage; PRP panretinal laser photocoagulation; IVK intravitreal kenalog; VMT vitreomacular traction; MH Macular hole;  NVD neovascularization of the disc; NVE neovascularization elsewhere; AREDS age related eye disease study; ARMD age related macular degeneration; POAG primary open angle glaucoma; EBMD epithelial/anterior basement membrane dystrophy; ACIOL anterior chamber intraocular lens; IOL intraocular lens; PCIOL posterior chamber intraocular lens; Phaco/IOL phacoemulsification with intraocular lens placement; [place] photorefractive keratectomy; LASIK laser assisted in situ keratomileusis; HTN hypertension; DM diabetes mellitus; COPD chronic obstructive pulmonary disease

## 2018-07-06 ENCOUNTER — Ambulatory Visit (INDEPENDENT_AMBULATORY_CARE_PROVIDER_SITE_OTHER): Payer: Medicare Other | Admitting: Ophthalmology

## 2018-07-06 ENCOUNTER — Other Ambulatory Visit: Payer: Self-pay

## 2018-07-06 ENCOUNTER — Encounter (INDEPENDENT_AMBULATORY_CARE_PROVIDER_SITE_OTHER): Payer: Self-pay | Admitting: Ophthalmology

## 2018-07-06 DIAGNOSIS — H3581 Retinal edema: Secondary | ICD-10-CM

## 2018-07-06 DIAGNOSIS — H3521 Other non-diabetic proliferative retinopathy, right eye: Secondary | ICD-10-CM

## 2018-07-06 DIAGNOSIS — H34231 Retinal artery branch occlusion, right eye: Secondary | ICD-10-CM

## 2018-07-06 DIAGNOSIS — Z961 Presence of intraocular lens: Secondary | ICD-10-CM

## 2018-07-06 DIAGNOSIS — H35371 Puckering of macula, right eye: Secondary | ICD-10-CM

## 2018-07-06 DIAGNOSIS — E113393 Type 2 diabetes mellitus with moderate nonproliferative diabetic retinopathy without macular edema, bilateral: Secondary | ICD-10-CM

## 2018-07-06 DIAGNOSIS — H25812 Combined forms of age-related cataract, left eye: Secondary | ICD-10-CM

## 2018-07-06 DIAGNOSIS — I10 Essential (primary) hypertension: Secondary | ICD-10-CM

## 2018-07-06 DIAGNOSIS — H35033 Hypertensive retinopathy, bilateral: Secondary | ICD-10-CM

## 2018-07-08 ENCOUNTER — Other Ambulatory Visit (HOSPITAL_COMMUNITY): Payer: Self-pay

## 2018-07-08 NOTE — Progress Notes (Signed)
Paramedicine Encounter    Patient ID: [name], female    DOB: Jan 26, 1951, 68 y.o.   MRN: [id number]   Patient Care Team: Jeanella Anton, NP as PCP - General (Nurse Practitioner) Jorge Ny, LCSW as Social Worker (Licensed Clinical Social Worker)  Patient Active Problem List   Diagnosis Date Noted  . Right foot infection 10/22/2017  . Cellulitis in diabetic foot ([place]) 10/22/2017  . Hyperglycemia 10/22/2017  . Lumbar radiculopathy 09/18/2017  . Degenerative spondylolisthesis 09/18/2017  . Atrial fibrillation ([place]) 02/11/2017  . Paroxysmal A-fib ([place])   . Biventricular automatic implantable cardioverter defibrillator in situ   . Sepsis ([place]) 04/20/2016  . UTI (urinary tract infection) 04/20/2016  . Dyspnea 07/19/2015  . Interstitial lung disease ([place]) 11/20/2014  . SIRS (systemic inflammatory response syndrome) ([place]) 02/03/2014  . IDDM (insulin dependent diabetes mellitus) ([place]) 02/03/2014  . Tachycardia 06/14/2013  . Chronic systolic CHF (congestive heart failure) ([place]) 09/29/2012  . Hypoxia 03/06/2012  . Hypertensive heart disease 03/06/2012  . Nonischemic cardiomyopathy ([place]) 03/06/2012  . Tobacco abuse 03/06/2012  . Type 2 diabetes mellitus ([place]) 03/06/2012  . Hypertension 03/06/2012  . CAD (coronary artery disease), native coronary artery 03/06/2012  . Hypokalemia 03/06/2012  . Acute bronchitis 02/01/2009  . Sleep apnea 02/01/2009  . Chest pain 02/01/2009    Current Outpatient Medications:  .  acetaminophen (TYLENOL) 500 MG tablet, Take 1 tablet (500 mg total) by mouth every 6 (six) hours as needed., Disp: 30 tablet, Rfl: 0 .  albuterol (PROVENTIL HFA;VENTOLIN HFA) 108 (90 Base) MCG/ACT inhaler, Inhale 2 puffs into the lungs every 6 (six) hours as needed for wheezing or shortness of breath., Disp: 18 g, Rfl: 0 .  apixaban (ELIQUIS) 5 MG TABS tablet, Take 1 tablet (5 mg total) by mouth 2 (two) times daily., Disp: 60 tablet, Rfl: 3 .  atorvastatin (LIPITOR) 40  MG tablet, Take 1 tablet (40 mg total) by mouth daily., Disp: 30 tablet, Rfl: 11 .  baclofen (LIORESAL) 10 MG tablet, TK 1 T PO BID PRN, Disp: , Rfl:  .  carvedilol (COREG) 25 MG tablet, TAKE 1 TABLET(25 MG) BY MOUTH TWICE DAILY WITH A MEAL, Disp: 180 tablet, Rfl: 3 .  ENTRESTO 97-103 MG, TAKE 1 TABLET BY MOUTH TWICE DAILY, Disp: 60 tablet, Rfl: 6 .  furosemide (LASIX) 40 MG tablet, Take 1 tablet (40 mg total) by mouth 2 (two) times daily., Disp: 180 tablet, Rfl: 3 .  gabapentin (NEURONTIN) 100 MG capsule, TK 1 C PO TID, Disp: , Rfl:  .  isosorbide-hydrALAZINE (BIDIL) 20-37.5 MG tablet, Take 1 tablet by mouth 3 (three) times daily., Disp: 90 tablet, Rfl: 6 .  metFORMIN (GLUCOPHAGE) 1000 MG tablet, TAKE 1 TABLET(1000 MG) BY MOUTH TWICE DAILY WITH A MEAL, Disp: 60 tablet, Rfl: 0 .  Potassium Chloride ER 20 MEQ TBCR, Take 40 mEq by mouth daily., Disp: 60 tablet, Rfl: 5 .  spironolactone (ALDACTONE) 25 MG tablet, TAKE 1 TABLET(25 MG) BY MOUTH DAILY, Disp: 30 tablet, Rfl: 3 .  Tafamidis (VYNDAMAX) 61 MG CAPS, Take 61 mg by mouth daily., Disp: 30 capsule, Rfl: 11 .  glucose blood (FREESTYLE TEST STRIPS) test strip, Use as instructed, Disp: 100 each, Rfl: 0 .  glucose monitoring kit (FREESTYLE) monitoring kit, 1 each by Does not apply route 4 (four) times daily - after meals and at bedtime. 1 month Diabetic Testing Supplies for QAC-QHS accuchecks., Disp: 1 each, Rfl: 1 .  HUMALOG KWIKPEN 100 UNIT/ML KwikPen, INJECT 10 UNITS  INTO THE SKIN WITH EACH MEAL, Disp: , Rfl:  .  insulin aspart (NOVOLOG FLEXPEN) 100 UNIT/ML FlexPen, 0-20 Units, Subcutaneous, 3 times daily with meals CBG < 70: implement hypoglycemia protocol CBG 70 - 120: 0 units CBG 121 - 150: 3 units CBG 151 - 200: 4 units CBG 201 - 250: 7 units CBG 251 - 300: 11 units CBG 301 - 350: 15 units CBG 351 - 400: 20 units CBG > 400: call MD, Disp: 15 mL, Rfl: 0 .  Insulin Glargine (LANTUS SOLOSTAR) 100 UNIT/ML Solostar Pen, Inject 50 Units into the skin  daily at 10 pm., Disp: 15 mL, Rfl: 0 .  Insulin Pen Needle 32G X 8 MM MISC, Use as directed, Disp: 100 each, Rfl: 0 .  Lancets (FREESTYLE) lancets, Use as instructed, Disp: 100 each, Rfl: 0 .  metroNIDAZOLE (FLAGYL) 500 MG tablet, TK 2 TS PO D, Disp: , Rfl:  .  naproxen sodium (ALEVE) 220 MG tablet, Take 220 mg by mouth daily as needed (for pain or headache). , Disp: , Rfl:  .  Oxycodone HCl 10 MG TABS, Take 10 mg by mouth 4 (four) times daily as needed (for pain)., Disp: , Rfl:  .  polyethylene glycol (MIRALAX / GLYCOLAX) packet, Take 17 g by mouth daily as needed for mild constipation. (Patient not taking: Reported on 03/25/2018), Disp: 14 each, Rfl: 0 .  valACYclovir (VALTREX) 1000 MG tablet, Take 1 tablet (1,000 mg total) by mouth 3 (three) times daily. (Patient not taking: Reported on 04/20/2018), Disp: 21 tablet, Rfl: 0 .  Vitamin D, Ergocalciferol, (DRISDOL) 1.25 MG (50000 UT) CAPS capsule, TK 1 C PO WEEKLY, Disp: , Rfl:  Allergies  Allergen Reactions  . Morphine And Related Nausea Only    Severe nausea  . Penicillins Other (See Comments)    Pt states she has had a pain in her leg since a penicillin injection 2 months ago (reported 08/31/17).  Has tolerated amoxicillin oral.  Has patient had a PCN reaction causing immediate rash, facial/tongue/throat swelling, SOB or lightheadedness with hypotension: No Has patient had a PCN reaction causing severe rash involving mucus membranes or skin necrosis: No Has patient had a PCN reaction that required hospitalization: No Has patient had a PCN reaction occurring within the last 10 years: Yes If a      Social History   Socioeconomic History  . Marital status: Single    Spouse name: Not on file  . Number of children: 4  . Years of education: 65  . Highest education level: Not on file  Occupational History  . Not on file  Social Needs  . Financial resource strain: Not hard at all  . Food insecurity    Worry: Never true    Inability:  Never true  . Transportation needs    Medical: No    Non-medical: No  Tobacco Use  . Smoking status: Former Smoker    Packs/day: 1.00    Years: 40.00    Pack years: 40.00    Types: Cigarettes    Start date: 06/23/1972    Quit date: 03/26/2012    Years since quitting: 6.2  . Smokeless tobacco: Never Used  Substance and Sexual Activity  . Alcohol use: No  . Drug use: No  . Sexual activity: Yes  Lifestyle  . Physical activity    Days per week: Not on file    Minutes per session: Not on file  . Stress: Not on file  Relationships  .  Social Herbalist on phone: Not on file    Gets together: Not on file    Attends religious service: Not on file    Active member of club or organization: Not on file    Attends meetings of clubs or organizations: Not on file    Relationship status: Not on file  . Intimate partner violence    Fear of current or ex partner: Not on file    Emotionally abused: Not on file    Physically abused: Not on file    Forced sexual activity: Not on file  Other Topics Concern  . Not on file  Social History Narrative  . Not on file    Physical Exam Cardiovascular:     Rate and Rhythm: Normal rate and regular rhythm.     Pulses: Normal pulses.  Pulmonary:     Effort: Pulmonary effort is normal.     Breath sounds: Normal breath sounds.  Musculoskeletal: Normal range of motion.     Right lower leg: No edema.     Left lower leg: No edema.  Skin:    General: Skin is warm and dry.     Capillary Refill: Capillary refill takes less than 2 seconds.  Neurological:     Mental Status: She is alert and oriented to person, place, and time.  Psychiatric:        Mood and Affect: Mood normal.         Future Appointments  Date Time Provider [place]  08/10/2018  8:30 AM Bernarda Caffey, MD [facility] None  08/23/2018  9:20 AM Larey Dresser, MD [facility] None  09/06/2018  7:35 AM [facility] DEVICE REMOTES [facility]    BP (!)  153/81 (BP Location: Right Arm, Patient Position: Sitting, Cuff Size: Normal)   Pulse 90   Resp 18   Wt 227 lb (103 kg)   SpO2 100%   BMI 35.55 kg/m   Weight yesterday- 227 lb Last visit weight- 226 lb  Ms Deetz was seen at home today and reported feeling more tired than usual. She denied chest pain, headache, dizziness, cough or fever since out last visit, but stated she has been experiencing orthopnea for the past 2-3 days. She denied a change in her diet and stated she had not gained any weight. She had missed one days worth of medication but she could not recall when. She did not exhibit any edema and her lung sounds were clear bilaterally. I advised her to call me in the morning with her weight and to keep a close eye on it over the weekend. She was understanding and agreeable. Her medications were verified and her pillbox was refilled.   [name], EMT 07/08/18  ACTION: Home visit completed Next visit planned for 1 week

## 2018-07-13 ENCOUNTER — Other Ambulatory Visit (HOSPITAL_COMMUNITY): Payer: Self-pay | Admitting: Cardiology

## 2018-07-21 ENCOUNTER — Other Ambulatory Visit (HOSPITAL_COMMUNITY): Payer: Self-pay

## 2018-07-21 MED ORDER — VYNDAMAX 61 MG PO CAPS: 61.0000 mg | ORAL_CAPSULE | Freq: Every day | ORAL | 11 refills | Status: DC

## 2018-07-23 ENCOUNTER — Other Ambulatory Visit (HOSPITAL_COMMUNITY): Payer: Self-pay

## 2018-07-23 ENCOUNTER — Other Ambulatory Visit (HOSPITAL_COMMUNITY): Payer: Self-pay | Admitting: Cardiology

## 2018-07-23 NOTE — Progress Notes (Signed)
Paramedicine Encounter    Patient ID: [name], female    DOB: 12-11-1950, 68 y.o.   MRN: [id number]    Patient Care Team: Jeanella Anton, NP as PCP - General (Nurse Practitioner) Jorge Ny, LCSW as Social Worker (Licensed Clinical Social Worker)  Patient Active Problem List   Diagnosis Date Noted  . Right foot infection 10/22/2017  . Cellulitis in diabetic foot ([place]) 10/22/2017  . Hyperglycemia 10/22/2017  . Lumbar radiculopathy 09/18/2017  . Degenerative spondylolisthesis 09/18/2017  . Atrial fibrillation ([place]) 02/11/2017  . Paroxysmal A-fib ([place])   . Biventricular automatic implantable cardioverter defibrillator in situ   . Sepsis ([place]) 04/20/2016  . UTI (urinary tract infection) 04/20/2016  . Dyspnea 07/19/2015  . Interstitial lung disease ([place]) 11/20/2014  . SIRS (systemic inflammatory response syndrome) ([place]) 02/03/2014  . IDDM (insulin dependent diabetes mellitus) ([place]) 02/03/2014  . Tachycardia 06/14/2013  . Chronic systolic CHF (congestive heart failure) ([place]) 09/29/2012  . Hypoxia 03/06/2012  . Hypertensive heart disease 03/06/2012  . Nonischemic cardiomyopathy ([place]) 03/06/2012  . Tobacco abuse 03/06/2012  . Type 2 diabetes mellitus ([place]) 03/06/2012  . Hypertension 03/06/2012  . CAD (coronary artery disease), native coronary artery 03/06/2012  . Hypokalemia 03/06/2012  . Acute bronchitis 02/01/2009  . Sleep apnea 02/01/2009  . Chest pain 02/01/2009    Current Outpatient Medications:  .  apixaban (ELIQUIS) 5 MG TABS tablet, Take 1 tablet (5 mg total) by mouth 2 (two) times daily., Disp: 60 tablet, Rfl: 3 .  atorvastatin (LIPITOR) 40 MG tablet, Take 1 tablet (40 mg total) by mouth daily., Disp: 30 tablet, Rfl: 11 .  carvedilol (COREG) 25 MG tablet, TAKE 1 TABLET(25 MG) BY MOUTH TWICE DAILY WITH A MEAL, Disp: 180 tablet, Rfl: 3 .  ENTRESTO 97-103 MG, TAKE 1 TABLET BY MOUTH TWICE DAILY, Disp: 60 tablet, Rfl: 6 .  furosemide (LASIX) 40 MG tablet, Take 1  tablet (40 mg total) by mouth 2 (two) times daily., Disp: 180 tablet, Rfl: 3 .  gabapentin (NEURONTIN) 100 MG capsule, TK 1 C PO TID, Disp: , Rfl:  .  isosorbide-hydrALAZINE (BIDIL) 20-37.5 MG tablet, Take 1 tablet by mouth 3 (three) times daily., Disp: 90 tablet, Rfl: 6 .  metFORMIN (GLUCOPHAGE) 1000 MG tablet, TAKE 1 TABLET(1000 MG) BY MOUTH TWICE DAILY WITH A MEAL, Disp: 60 tablet, Rfl: 0 .  spironolactone (ALDACTONE) 25 MG tablet, TAKE 1 TABLET(25 MG) BY MOUTH DAILY, Disp: 30 tablet, Rfl: 3 .  Tafamidis (VYNDAMAX) 61 MG CAPS, Take 61 mg by mouth daily., Disp: 30 capsule, Rfl: 11 .  acetaminophen (TYLENOL) 500 MG tablet, Take 1 tablet (500 mg total) by mouth every 6 (six) hours as needed., Disp: 30 tablet, Rfl: 0 .  albuterol (PROVENTIL HFA;VENTOLIN HFA) 108 (90 Base) MCG/ACT inhaler, Inhale 2 puffs into the lungs every 6 (six) hours as needed for wheezing or shortness of breath., Disp: 18 g, Rfl: 0 .  baclofen (LIORESAL) 10 MG tablet, TK 1 T PO BID PRN, Disp: , Rfl:  .  glucose blood (FREESTYLE TEST STRIPS) test strip, Use as instructed, Disp: 100 each, Rfl: 0 .  glucose monitoring kit (FREESTYLE) monitoring kit, 1 each by Does not apply route 4 (four) times daily - after meals and at bedtime. 1 month Diabetic Testing Supplies for QAC-QHS accuchecks., Disp: 1 each, Rfl: 1 .  HUMALOG KWIKPEN 100 UNIT/ML KwikPen, INJECT 10 UNITS INTO THE SKIN WITH EACH MEAL, Disp: , Rfl:  .  insulin aspart (NOVOLOG FLEXPEN) 100 UNIT/ML  FlexPen, 0-20 Units, Subcutaneous, 3 times daily with meals CBG < 70: implement hypoglycemia protocol CBG 70 - 120: 0 units CBG 121 - 150: 3 units CBG 151 - 200: 4 units CBG 201 - 250: 7 units CBG 251 - 300: 11 units CBG 301 - 350: 15 units CBG 351 - 400: 20 units CBG > 400: call MD, Disp: 15 mL, Rfl: 0 .  Insulin Glargine (LANTUS SOLOSTAR) 100 UNIT/ML Solostar Pen, Inject 50 Units into the skin daily at 10 pm., Disp: 15 mL, Rfl: 0 .  Insulin Pen Needle 32G X 8 MM MISC, Use as  directed, Disp: 100 each, Rfl: 0 .  Lancets (FREESTYLE) lancets, Use as instructed, Disp: 100 each, Rfl: 0 .  metroNIDAZOLE (FLAGYL) 500 MG tablet, TK 2 TS PO D, Disp: , Rfl:  .  naproxen sodium (ALEVE) 220 MG tablet, Take 220 mg by mouth daily as needed (for pain or headache). , Disp: , Rfl:  .  Oxycodone HCl 10 MG TABS, Take 10 mg by mouth 4 (four) times daily as needed (for pain)., Disp: , Rfl:  .  polyethylene glycol (MIRALAX / GLYCOLAX) packet, Take 17 g by mouth daily as needed for mild constipation. (Patient not taking: Reported on 03/25/2018), Disp: 14 each, Rfl: 0 .  Potassium Chloride ER 20 MEQ TBCR, TAKE 2 TABLETS BY MOUTH DAILY, Disp: 60 tablet, Rfl: 5 .  valACYclovir (VALTREX) 1000 MG tablet, Take 1 tablet (1,000 mg total) by mouth 3 (three) times daily. (Patient not taking: Reported on 04/20/2018), Disp: 21 tablet, Rfl: 0 .  Vitamin D, Ergocalciferol, (DRISDOL) 1.25 MG (50000 UT) CAPS capsule, TK 1 C PO WEEKLY, Disp: , Rfl:  Allergies  Allergen Reactions  . Morphine And Related Nausea Only    Severe nausea  . Penicillins Other (See Comments)    Pt states she has had a pain in her leg since a penicillin injection 2 months ago (reported 08/31/17).  Has tolerated amoxicillin oral.  Has patient had a PCN reaction causing immediate rash, facial/tongue/throat swelling, SOB or lightheadedness with hypotension: No Has patient had a PCN reaction causing severe rash involving mucus membranes or skin necrosis: No Has patient had a PCN reaction that required hospitalization: No Has patient had a PCN reaction occurring within the last 10 years: Yes If a     Social History   Socioeconomic History  . Marital status: Single    Spouse name: Not on file  . Number of children: 4  . Years of education: 62  . Highest education level: Not on file  Occupational History  . Not on file  Social Needs  . Financial resource strain: Not hard at all  . Food insecurity    Worry: Never true     Inability: Never true  . Transportation needs    Medical: No    Non-medical: No  Tobacco Use  . Smoking status: Former Smoker    Packs/day: 1.00    Years: 40.00    Pack years: 40.00    Types: Cigarettes    Start date: 06/23/1972    Quit date: 03/26/2012    Years since quitting: 6.3  . Smokeless tobacco: Never Used  Substance and Sexual Activity  . Alcohol use: No  . Drug use: No  . Sexual activity: Yes  Lifestyle  . Physical activity    Days per week: Not on file    Minutes per session: Not on file  . Stress: Not on file  Relationships  .  Social Herbalist on phone: Not on file    Gets together: Not on file    Attends religious service: Not on file    Active member of club or organization: Not on file    Attends meetings of clubs or organizations: Not on file    Relationship status: Not on file  . Intimate partner violence    Fear of current or ex partner: Not on file    Emotionally abused: Not on file    Physically abused: Not on file    Forced sexual activity: Not on file  Other Topics Concern  . Not on file  Social History Narrative  . Not on file    Physical Exam Pulmonary:     Effort: Pulmonary effort is normal. No respiratory distress.     Breath sounds: No wheezing or rales.  Abdominal:     Palpations: Abdomen is soft.     Tenderness: There is no abdominal tenderness.  Skin:    General: Skin is warm and dry.         Future Appointments  Date Time Provider [place]  08/10/2018  8:30 AM Bernarda Caffey, MD [facility] None  08/23/2018  9:20 AM Larey Dresser, MD [facility] None  09/06/2018  7:35 AM [facility] DEVICE REMOTES [facility]     BP (!) 146/90 (BP Location: Left Arm, Patient Position: Sitting, Cuff Size: Normal)   Pulse 81   Wt 234 lb (106.1 kg)   SpO2 98%   BMI 36.65 kg/m   Weight yesterday-didn't weigh Last visit weight-227 (6/11)  ATF pt CAO x4 sitting in the living room with no complaints. She said  that she was on her way out to run errands.  She has taken all of her medications within the past two weeks.  She denies sob, chest pain and dizziness.  Pt stated that she has not increased the amount of pillows that she uses to sleep.  She denies bleeding and said that her appetite is normal.  rx bottles verified and pill box (2) were refilled.    Medication ordered:  Spirolactone vyndamex Potassium  Eddison Searls, EMT Paramedic [phone] 07/23/2018    ACTION: Home visit completed

## 2018-07-28 ENCOUNTER — Telehealth (HOSPITAL_COMMUNITY): Payer: Self-pay

## 2018-07-28 NOTE — Telephone Encounter (Signed)
I called [name] to schedule an appointment. She stated that she was not out of her pillboxes yet and would rather wait until next week for me to come. I agreed and we scheduled a visit for next Thursday at 09:00.

## 2018-08-02 ENCOUNTER — Telehealth (HOSPITAL_COMMUNITY): Payer: Self-pay | Admitting: Licensed Clinical Social Worker

## 2018-08-02 NOTE — Telephone Encounter (Signed)
Pt enrolled in [employer] Me[employer] 3 month study through [facility]- patient's 3 month period has now ended so CSW spoke with pt to complete an exit interview regarding patient's experience:  1. Since being on Moms Meals have you noticed any changes in your health or the way you feel physically? Yes felt lighter Weight loss? Reports losing a lot of weight but unsure how much Improvement in BP? States her BP has been good since starting program.  2. What do you like about the program? Loved how healthy the food was and how convenient it was as she is unable to cook anymore due to her health. What would you like to change/add? No changes suggested.  3. How did you get food prior to [facility]? a. Did you get food stamps? $43/month b. Did someone help financially? Borrows money from her kids when she needs to c. Did you access food banks? Is aware of food banks and accesses them when needed. d. [place] you anticipate any issues transitioning back to getting your own food after this program ends? no  4. Would you be interested in future food pilots? Yes- would be very interested  5. Would you like a dietary consult? yes  6. Do you have internet access? Tablet/Smart Phone? no   Jorge Ny, LCSW Clinical Social Worker [facility] Desk#: [phone] Cell#: [phone]

## 2018-08-03 NOTE — Telephone Encounter (Signed)
Entered in error

## 2018-08-05 ENCOUNTER — Other Ambulatory Visit (HOSPITAL_COMMUNITY): Payer: Self-pay | Admitting: Internal Medicine

## 2018-08-05 ENCOUNTER — Other Ambulatory Visit (HOSPITAL_COMMUNITY): Payer: Self-pay | Admitting: Cardiology

## 2018-08-05 ENCOUNTER — Other Ambulatory Visit (HOSPITAL_COMMUNITY): Payer: Self-pay

## 2018-08-05 MED ORDER — VYNDAMAX 61 MG PO CAPS: 61.0000 mg | ORAL_CAPSULE | Freq: Every day | ORAL | 11 refills | Status: DC

## 2018-08-05 MED ORDER — APIXABAN 5 MG PO TABS: 5.0000 mg | ORAL_TABLET | Freq: Two times a day (BID) | ORAL | 3 refills | Status: DC

## 2018-08-05 NOTE — Progress Notes (Signed)
Paramedicine Encounter    Patient ID: [name], female    DOB: 01-Feb-1950, 68 y.o.   MRN: [id number]   Patient Care Team: Jeanella Anton, NP as PCP - General (Nurse Practitioner) Jorge Ny, LCSW as Social Worker (Licensed Clinical Social Worker)  Patient Active Problem List   Diagnosis Date Noted  . Right foot infection 10/22/2017  . Cellulitis in diabetic foot ([place]) 10/22/2017  . Hyperglycemia 10/22/2017  . Lumbar radiculopathy 09/18/2017  . Degenerative spondylolisthesis 09/18/2017  . Atrial fibrillation ([place]) 02/11/2017  . Paroxysmal A-fib ([place])   . Biventricular automatic implantable cardioverter defibrillator in situ   . Sepsis ([place]) 04/20/2016  . UTI (urinary tract infection) 04/20/2016  . Dyspnea 07/19/2015  . Interstitial lung disease ([place]) 11/20/2014  . SIRS (systemic inflammatory response syndrome) ([place]) 02/03/2014  . IDDM (insulin dependent diabetes mellitus) ([place]) 02/03/2014  . Tachycardia 06/14/2013  . Chronic systolic CHF (congestive heart failure) ([place]) 09/29/2012  . Hypoxia 03/06/2012  . Hypertensive heart disease 03/06/2012  . Nonischemic cardiomyopathy ([place]) 03/06/2012  . Tobacco abuse 03/06/2012  . Type 2 diabetes mellitus ([place]) 03/06/2012  . Hypertension 03/06/2012  . CAD (coronary artery disease), native coronary artery 03/06/2012  . Hypokalemia 03/06/2012  . Acute bronchitis 02/01/2009  . Sleep apnea 02/01/2009  . Chest pain 02/01/2009    Current Outpatient Medications:  .  acetaminophen (TYLENOL) 500 MG tablet, Take 1 tablet (500 mg total) by mouth every 6 (six) hours as needed., Disp: 30 tablet, Rfl: 0 .  albuterol (PROVENTIL HFA;VENTOLIN HFA) 108 (90 Base) MCG/ACT inhaler, Inhale 2 puffs into the lungs every 6 (six) hours as needed for wheezing or shortness of breath., Disp: 18 g, Rfl: 0 .  atorvastatin (LIPITOR) 40 MG tablet, Take 1 tablet (40 mg total) by mouth daily., Disp: 30 tablet, Rfl: 11 .  baclofen (LIORESAL) 10 MG tablet, TK 1  T PO BID PRN, Disp: , Rfl:  .  carvedilol (COREG) 25 MG tablet, TAKE 1 TABLET(25 MG) BY MOUTH TWICE DAILY WITH A MEAL, Disp: 180 tablet, Rfl: 3 .  ENTRESTO 97-103 MG, TAKE 1 TABLET BY MOUTH TWICE DAILY, Disp: 60 tablet, Rfl: 6 .  furosemide (LASIX) 40 MG tablet, Take 1 tablet (40 mg total) by mouth 2 (two) times daily., Disp: 180 tablet, Rfl: 3 .  gabapentin (NEURONTIN) 100 MG capsule, TK 1 C PO TID, Disp: , Rfl:  .  metFORMIN (GLUCOPHAGE) 1000 MG tablet, TAKE 1 TABLET(1000 MG) BY MOUTH TWICE DAILY WITH A MEAL, Disp: 60 tablet, Rfl: 0 .  naproxen sodium (ALEVE) 220 MG tablet, Take 220 mg by mouth daily as needed (for pain or headache). , Disp: , Rfl:  .  Potassium Chloride ER 20 MEQ TBCR, TAKE 2 TABLETS BY MOUTH DAILY, Disp: 60 tablet, Rfl: 5 .  spironolactone (ALDACTONE) 25 MG tablet, TAKE 1 TABLET(25 MG) BY MOUTH DAILY, Disp: 30 tablet, Rfl: 3 .  apixaban (ELIQUIS) 5 MG TABS tablet, Take 1 tablet (5 mg total) by mouth 2 (two) times daily., Disp: 60 tablet, Rfl: 3 .  BIDIL 20-37.5 MG tablet, TAKE 1 TABLET BY MOUTH THREE TIMES DAILY, Disp: 90 tablet, Rfl: 6 .  glucose blood (FREESTYLE TEST STRIPS) test strip, Use as instructed, Disp: 100 each, Rfl: 0 .  glucose monitoring kit (FREESTYLE) monitoring kit, 1 each by Does not apply route 4 (four) times daily - after meals and at bedtime. 1 month Diabetic Testing Supplies for QAC-QHS accuchecks., Disp: 1 each, Rfl: 1 .  HUMALOG KWIKPEN 100  UNIT/ML KwikPen, INJECT 10 UNITS INTO THE SKIN WITH EACH MEAL, Disp: , Rfl:  .  insulin aspart (NOVOLOG FLEXPEN) 100 UNIT/ML FlexPen, 0-20 Units, Subcutaneous, 3 times daily with meals CBG < 70: implement hypoglycemia protocol CBG 70 - 120: 0 units CBG 121 - 150: 3 units CBG 151 - 200: 4 units CBG 201 - 250: 7 units CBG 251 - 300: 11 units CBG 301 - 350: 15 units CBG 351 - 400: 20 units CBG > 400: call MD, Disp: 15 mL, Rfl: 0 .  Insulin Glargine (LANTUS SOLOSTAR) 100 UNIT/ML Solostar Pen, Inject 50 Units into the skin  daily at 10 pm., Disp: 15 mL, Rfl: 0 .  Insulin Pen Needle 32G X 8 MM MISC, Use as directed, Disp: 100 each, Rfl: 0 .  Lancets (FREESTYLE) lancets, Use as instructed, Disp: 100 each, Rfl: 0 .  metroNIDAZOLE (FLAGYL) 500 MG tablet, TK 2 TS PO D, Disp: , Rfl:  .  Oxycodone HCl 10 MG TABS, Take 10 mg by mouth 4 (four) times daily as needed (for pain)., Disp: , Rfl:  .  polyethylene glycol (MIRALAX / GLYCOLAX) packet, Take 17 g by mouth daily as needed for mild constipation. (Patient not taking: Reported on 03/25/2018), Disp: 14 each, Rfl: 0 .  Tafamidis (VYNDAMAX) 61 MG CAPS, Take 61 mg by mouth daily., Disp: 30 capsule, Rfl: 11 .  valACYclovir (VALTREX) 1000 MG tablet, Take 1 tablet (1,000 mg total) by mouth 3 (three) times daily. (Patient not taking: Reported on 04/20/2018), Disp: 21 tablet, Rfl: 0 .  Vitamin D, Ergocalciferol, (DRISDOL) 1.25 MG (50000 UT) CAPS capsule, TK 1 C PO WEEKLY, Disp: , Rfl:  Allergies  Allergen Reactions  . Morphine And Related Nausea Only    Severe nausea  . Penicillins Other (See Comments)    Pt states she has had a pain in her leg since a penicillin injection 2 months ago (reported 08/31/17).  Has tolerated amoxicillin oral.  Has patient had a PCN reaction causing immediate rash, facial/tongue/throat swelling, SOB or lightheadedness with hypotension: No Has patient had a PCN reaction causing severe rash involving mucus membranes or skin necrosis: No Has patient had a PCN reaction that required hospitalization: No Has patient had a PCN reaction occurring within the last 10 years: Yes If a      Social History   Socioeconomic History  . Marital status: Single    Spouse name: Not on file  . Number of children: 4  . Years of education: 30  . Highest education level: Not on file  Occupational History  . Not on file  Social Needs  . Financial resource strain: Not hard at all  . Food insecurity    Worry: Never true    Inability: Never true  . Transportation  needs    Medical: No    Non-medical: No  Tobacco Use  . Smoking status: Former Smoker    Packs/day: 1.00    Years: 40.00    Pack years: 40.00    Types: Cigarettes    Start date: 06/23/1972    Quit date: 03/26/2012    Years since quitting: 6.3  . Smokeless tobacco: Never Used  Substance and Sexual Activity  . Alcohol use: No  . Drug use: No  . Sexual activity: Yes  Lifestyle  . Physical activity    Days per week: Not on file    Minutes per session: Not on file  . Stress: Not on file  Relationships  . Social connections  Talks on phone: Not on file    Gets together: Not on file    Attends religious service: Not on file    Active member of club or organization: Not on file    Attends meetings of clubs or organizations: Not on file    Relationship status: Not on file  . Intimate partner violence    Fear of current or ex partner: Not on file    Emotionally abused: Not on file    Physically abused: Not on file    Forced sexual activity: Not on file  Other Topics Concern  . Not on file  Social History Narrative  . Not on file    Physical Exam Cardiovascular:     Rate and Rhythm: Normal rate and regular rhythm.     Pulses: Normal pulses.  Pulmonary:     Effort: Pulmonary effort is normal.     Breath sounds: Normal breath sounds.  Abdominal:     General: Abdomen is flat.  Musculoskeletal: Normal range of motion.     Right lower leg: No edema.     Left lower leg: No edema.  Skin:    General: Skin is warm and dry.     Capillary Refill: Capillary refill takes less than 2 seconds.  Neurological:     Mental Status: She is alert and oriented to person, place, and time.  Psychiatric:        Mood and Affect: Mood normal.         Future Appointments  Date Time Provider [place]  08/10/2018  8:30 AM Bernarda Caffey, MD [facility] None  09/06/2018  7:35 AM [facility] DEVICE REMOTES [facility]  09/10/2018  8:40 AM Larey Dresser, MD [facility] None     BP 131/81 (BP Location: Left Arm, Patient Position: Sitting, Cuff Size: Normal)   Pulse 80   Resp 16   Wt 223 lb (101.2 kg)   SpO2 99%   BMI 34.93 kg/m   Weight yesterday- Did not weigh Last visit weight- 234 lb  [name] was seen at home today and reported feeling well. She denied SOB, headache, dizziness, orthopnea, cough or fever since our last visit. She stated she has not been taking her medications as prescribed and did not offer an explanation. She ran out of several medications when I was filling her pillbox so I only filled one and advised her to pick up the necessary refills before I come back next week. She was understanding and agreeable.   [name], EMT 08/05/18  ACTION: Home visit completed Next visit planned for 1 week

## 2018-08-06 ENCOUNTER — Other Ambulatory Visit (HOSPITAL_COMMUNITY): Payer: Self-pay

## 2018-08-06 MED ORDER — APIXABAN 5 MG PO TABS: 5.0000 mg | ORAL_TABLET | Freq: Two times a day (BID) | ORAL | 3 refills | Status: DC

## 2018-08-08 NOTE — Progress Notes (Signed)
[facility] Note  08/09/2018     CHIEF COMPLAINT Patient presents for Retina Evaluation   HISTORY OF PRESENT ILLNESS: [name] is a 68 y.o. female who presents to the clinic today for:   HPI    Retina Evaluation    In both eyes.  This started weeks ago.  Duration of weeks.  Context:  distance vision.  I, the attending physician,  performed the HPI with the patient and updated documentation appropriately.          Comments    BS: 120  Last HgA1c: 10.9 (From 2019...states she has not had check this year) Patient states her vision is stable in both eyes.  She denies eye pain or discomfort and denies any new or worsening floaters or fol OU.       Last edited by Bernarda Caffey, MD on 08/09/2018  1:11 PM. (History)    pt states   Referring physician: Jeanella Anton, NP [address] [place],  [place] [address]  HISTORICAL INFORMATION:   Selected notes from the MEDICAL RECORD NUMBER Referred by Dr. Thurston Hole for concern of BRVO OD / ERM OD LEE: 02.10.20 (S. Bernstorf) [BCVA: OD: 20/25- OS: 20/25] Ocular Hx-DES OU, narrow angles PMH-DM (metformin, novolin, lantus), HTN, HLD, heart disease    CURRENT MEDICATIONS: Current Outpatient Medications (Ophthalmic Drugs)  Medication Sig  . prednisoLONE acetate (PRED FORTE) 1 % ophthalmic suspension Place 1 drop into the right eye 4 (four) times daily for 7 days.   No current facility-administered medications for this visit.  (Ophthalmic Drugs)   Current Outpatient Medications (Other)  Medication Sig  . acetaminophen (TYLENOL) 500 MG tablet Take 1 tablet (500 mg total) by mouth every 6 (six) hours as needed.  Marland Kitchen albuterol (PROVENTIL HFA;VENTOLIN HFA) 108 (90 Base) MCG/ACT inhaler Inhale 2 puffs into the lungs every 6 (six) hours as needed for wheezing or shortness of breath.  Marland Kitchen apixaban (ELIQUIS) 5 MG TABS tablet Take 1 tablet (5 mg total) by mouth 2 (two) times daily.  Marland Kitchen  atorvastatin (LIPITOR) 40 MG tablet Take 1 tablet (40 mg total) by mouth daily.  . baclofen (LIORESAL) 10 MG tablet TK 1 T PO BID PRN  . BIDIL 20-37.5 MG tablet TAKE 1 TABLET BY MOUTH THREE TIMES DAILY  . carvedilol (COREG) 25 MG tablet TAKE 1 TABLET(25 MG) BY MOUTH TWICE DAILY WITH A MEAL  . ENTRESTO 97-103 MG TAKE 1 TABLET BY MOUTH TWICE DAILY  . furosemide (LASIX) 40 MG tablet Take 1 tablet (40 mg total) by mouth 2 (two) times daily.  Marland Kitchen gabapentin (NEURONTIN) 100 MG capsule TK 1 C PO TID  . glucose blood (FREESTYLE TEST STRIPS) test strip Use as instructed  . glucose monitoring kit (FREESTYLE) monitoring kit 1 each by Does not apply route 4 (four) times daily - after meals and at bedtime. 1 month Diabetic Testing Supplies for QAC-QHS accuchecks.  Marland Kitchen HUMALOG KWIKPEN 100 UNIT/ML KwikPen INJECT 10 UNITS INTO THE SKIN WITH EACH MEAL  . insulin aspart (NOVOLOG FLEXPEN) 100 UNIT/ML FlexPen 0-20 Units, Subcutaneous, 3 times daily with meals CBG < 70: implement hypoglycemia protocol CBG 70 - 120: 0 units CBG 121 - 150: 3 units CBG 151 - 200: 4 units CBG 201 - 250: 7 units CBG 251 - 300: 11 units CBG 301 - 350: 15 units CBG 351 - 400: 20 units CBG > 400: call MD  . Insulin Glargine (LANTUS SOLOSTAR) 100  UNIT/ML Solostar Pen Inject 50 Units into the skin daily at 10 pm.  . Insulin Pen Needle 32G X 8 MM MISC Use as directed  . Lancets (FREESTYLE) lancets Use as instructed  . metFORMIN (GLUCOPHAGE) 1000 MG tablet TAKE 1 TABLET(1000 MG) BY MOUTH TWICE DAILY WITH A MEAL  . metroNIDAZOLE (FLAGYL) 500 MG tablet TK 2 TS PO D  . naproxen sodium (ALEVE) 220 MG tablet Take 220 mg by mouth daily as needed (for pain or headache).   . Oxycodone HCl 10 MG TABS Take 10 mg by mouth 4 (four) times daily as needed (for pain).  . polyethylene glycol (MIRALAX / GLYCOLAX) packet Take 17 g by mouth daily as needed for mild constipation. (Patient not taking: Reported on 03/25/2018)  . Potassium Chloride ER 20 MEQ TBCR  TAKE 2 TABLETS BY MOUTH DAILY  . spironolactone (ALDACTONE) 25 MG tablet TAKE 1 TABLET(25 MG) BY MOUTH DAILY  . Tafamidis (VYNDAMAX) 61 MG CAPS Take 61 mg by mouth daily.  . valACYclovir (VALTREX) 1000 MG tablet Take 1 tablet (1,000 mg total) by mouth 3 (three) times daily. (Patient not taking: Reported on 04/20/2018)  . Vitamin D, Ergocalciferol, (DRISDOL) 1.25 MG (50000 UT) CAPS capsule TK 1 C PO WEEKLY   No current facility-administered medications for this visit.  (Other)      REVIEW OF SYSTEMS: ROS    Positive for: Endocrine, Cardiovascular, Eyes, Respiratory   Negative for: Constitutional, Gastrointestinal, Neurological, Skin, Genitourinary, Musculoskeletal, HENT, Psychiatric, Allergic/Imm, Heme/Lymph   Last edited by Doneen Poisson on 08/09/2018  8:57 AM. (History)       ALLERGIES Allergies  Allergen Reactions  . Morphine And Related Nausea Only    Severe nausea  . Penicillins Other (See Comments)    Pt states she has had a pain in her leg since a penicillin injection 2 months ago (reported 08/31/17).  Has tolerated amoxicillin oral.  Has patient had a PCN reaction causing immediate rash, facial/tongue/throat swelling, SOB or lightheadedness with hypotension: No Has patient had a PCN reaction causing severe rash involving mucus membranes or skin necrosis: No Has patient had a PCN reaction that required hospitalization: No Has patient had a PCN reaction occurring within the last 10 years: Yes If a    PAST MEDICAL HISTORY Past Medical History:  Diagnosis Date  . Anemia    a. Noted on 07/2012 labs, instructed to f/u PCP.  Marland Kitchen Arthritis    "joints" (11/18/2012)  . CAD (coronary artery disease), native coronary artery    a. Nonobstructive by cath 02/2012 (done because of low EF).  . Chronic bronchitis ([place])    "~ every other year" (11/18/2012)  . Chronic combined systolic and diastolic CHF (congestive heart failure) ([place])    a. 03/05/12 echo:  LVEF 20-25%, moderate LVH ,  inferior and basal to mid septal akinesis, anterior moderate to severe hypokinesis and grade 2 diastolic dysfunction. b. EF 07/2012: EF still 25% (unclear medication compliance).  . Chronic lower back pain   . Headache(784.0)    "often; maybe not daily" (11/18/2012)  . High cholesterol   . History of noncompliance with medical treatment   . Hypertension   . LBBB (left bundle branch block)   . Orthopnea   . Tobacco abuse   . Type II diabetes mellitus ([place])    Past Surgical History:  Procedure Laterality Date  . BI-VENTRICULAR IMPLANTABLE CARDIOVERTER DEFIBRILLATOR N/A 11/18/2012   Procedure: BI-VENTRICULAR IMPLANTABLE CARDIOVERTER DEFIBRILLATOR  (CRT-D);  Surgeon: Evans Lance, MD;  Location: [facility] CATH LAB;  Service: Cardiovascular;  Laterality: N/A;  . BI-VENTRICULAR IMPLANTABLE CARDIOVERTER DEFIBRILLATOR  (CRT-D)  11/18/2012  . CARDIAC CATHETERIZATION  03/04/12   nonobstructive CAD, elevated LVEDP and tortuous vessels suggestive of long-standing hypertension  . COLONOSCOPY WITH PROPOFOL N/A 12/12/2016   Procedure: COLONOSCOPY WITH PROPOFOL;  Surgeon: Carol Ada, MD;  Location: [facility] ENDOSCOPY;  Service: Endoscopy;  Laterality: N/A;  . JOINT REPLACEMENT     Bilateral hip and right knee  . LEFT HEART CATH N/A 03/05/2012   Procedure: LEFT HEART CATH;  Surgeon: Larey Dresser, MD;  [facility] C[facility];  Service: Cardiovascular;  Laterality: N/A;    FAMILY HISTORY Family History  Problem Relation Age of Onset  . Heart disease Neg Hx     SOCIAL HISTORY Social History   Tobacco Use  . Smoking status: Former Smoker    Packs/day: 1.00    Years: 40.00    Pack years: 40.00    Types: Cigarettes    Start date: 06/23/1972    Quit date: 03/26/2012    Years since quitting: 6.3  . Smokeless tobacco: Never Used  Substance Use Topics  . Alcohol use: No  . Drug use: No         OPHTHALMIC EXAM:  Base Eye Exam    Visual Acuity (Snellen - Linear)      Right Left   Dist [place] 20/25 -1  20/70 -2   Dist ph [place] NI 20/25 -2       Tonometry (Tonopen, 9:01 AM)      Right Left   Pressure 12 16       Pupils      Dark Light Shape React APD   Right 2 1 Round Minimal 0   Left 2 1 Round Minimal 0       Extraocular Movement      Right Left    Full Full       Neuro/Psych    Oriented x3: Yes   Mood/Affect: Normal       Dilation    Both eyes: 1.0% Mydriacyl, 2.5% Phenylephrine @ 9:01 AM        Slit Lamp and Fundus Exam    Slit Lamp Exam      Right Left   Lids/Lashes Dermatochalasis - upper lid Dermatochalasis - upper lid   Conjunctiva/Sclera nasal Pinguecula, Melanosis nasal/temporal Pinguecula, Melanosis   Cornea 2+ Punctate epithelial erosions, Well healed  IT cataract wounds 1-2+ Punctate epithelial erosions   Anterior Chamber deep 0.5+pigment deep and clear   Iris Round and dilated, No NVI Round and dilated, No NVI   Lens PC IOL in good position 2-3+ Nuclear sclerosis, 2-3+ Cortical cataract   Vitreous Vitreous syneresis, mild cell/pigment on anterior vitreous Vitreous syneresis       Fundus Exam      Right Left   Disc +cupping, mild tilt, temporal PPA sharp rim, +cupping, temporal PPA   C/D Ratio 0.6 0.8   Macula Blunted foveal reflex, 3-4+ERM, no heme Good foveal reflex, rare MA, cluster of IRH inferior  to fovea   Vessels Vascular attenuation, veins dilated/tortuous, AV crossing changes, white, sclerotic arteriole / BRAO at 1200 - good laser surrounding Vascular attenuation, Tortuous   Periphery Attached, fibrotic NV at 1200 - partially regressed, ?seafan, pigmented cystoid degeneration at 0600 - segmental PRP laser changes in area of BRAO and NV - room for fill in Attached             IMAGING  AND PROCEDURES  Imaging and Procedures for '@TODAY' @  OCT, Retina - OU - Both Eyes       Right Eye Quality was good. Central Foveal Thickness: 423. Progression has been stable. Findings include abnormal foveal contour, epiretinal membrane, no IRF, no SRF,  preretinal fibrosis, macular pucker (Thick ERM - no change from prior).   Left Eye Quality was good. Central Foveal Thickness: 233. Progression has been stable. Findings include normal foveal contour, no SRF, no IRF, vitreomacular adhesion .   Notes *Images captured and stored on drive  Diagnosis / Impression:  OD: thick ERM with macular pucker -- stable from prior OS: NFP, no IRF/SRF, mild VMA -- stable from prior   Clinical management:  See below  Abbreviations: NFP - Normal foveal profile. CME - cystoid macular edema. PED - pigment epithelial detachment. IRF - intraretinal fluid. SRF - subretinal fluid. EZ - ellipsoid zone. ERM - epiretinal membrane. ORA - outer retinal atrophy. ORT - outer retinal tubulation. SRHM - subretinal hyper-reflective material        Fluorescein Angiography Optos (Transit OD)       Right Eye   Progression has improved. Early phase findings include microaneurysm, vascular perfusion defect, retinal neovascularization, leakage. Mid/Late phase findings include vascular perfusion defect, retinal neovascularization, microaneurysm, leakage.   Left Eye   Progression has been stable. Early phase findings include microaneurysm. Mid/Late phase findings include microaneurysm, leakage.   Notes Images stored on drive;   Impression: OD: superior vascular perfusion defect consistent with BRAO; proliferative retinopathy, post BRAO v diabetic v sickle cell; scattered late leaking MA; interval improvement in superior leaking NV from prior OS: moderate NPDR         Panretinal Photocoagulation - OD - Right Eye       LASER PROCEDURE NOTE  Diagnosis:   Proliferative Retinopathy, RIGHT EYE  Procedure:  Segmental pan-retinal photocoagulation using slit lamp laser, RIGHT EYE, fill in  Anesthesia:  Topical  Surgeon: Bernarda Caffey, MD, PhD   Informed consent obtained, operative eye marked, and time out performed prior to initiation of laser.   [place]  [id number] slit lamp laser Pattern: 3x3 square Power: 280 mW Duration: 30 msec  Spot size: 200 microns  # spots: 543 spots in area of nonperfusion superior to disc and nasal peripheral areas of capillary nonperfusion  Complications: None.  RTC: 6-8 wks  Patient tolerated the procedure well and received written and verbal post-procedure care information/education.                  ASSESSMENT/PLAN:    ICD-10-CM   1. Proliferative retinopathy of right eye  H35.21 Fluorescein Angiography Optos (Transit OD)    Panretinal Photocoagulation - OD - Right Eye  2. BRAO (branch retinal artery occlusion), right  H34.231 Fluorescein Angiography Optos (Transit OD)    Panretinal Photocoagulation - OD - Right Eye  3. Epiretinal membrane (ERM) of right eye  H35.371   4. Retinal edema  H35.81 OCT, Retina - OU - Both Eyes  5. Moderate nonproliferative diabetic retinopathy of both eyes without macular edema associated with type 2 diabetes mellitus ([place])  B52.0802   6. Essential hypertension  I10   7. Hypertensive retinopathy of both eyes  H35.033 Fluorescein Angiography Optos (Transit OD)  8. Combined forms of age-related cataract of left eye  H25.812   9. Pseudophakia  Z96.1   10. Combined forms of age-related cataract of both eyes  H25.813     1,2. Proliferative retinopathy and BRAO OD  -  sclerotic superior arteriole with distal fibrotic NV (?sea fan) and overlying preretinal and vitreous heme  - etiology could also be DM2 vs sickle cell retinopathy -- sickle cell screen negative  - discussed findings, prognosis and treatment options  - s/p segmental PRP OD (2.17.2020) - good laser changes in place   - repeat FA today 7.13.20 shows some persistent hemorrhagic/NV activity, although improved  - may benefit from some fill in laser PRP OD with some extension nasally to areas of capillary nonperfusion  - recommend PRP fill in today, 07.13.20  - pt wishes to proceed  - RBA of procedure  discussed, questions answered  - informed consent obtained and signed  - see procedure note  - improved view post cataract surgery  - f/u 6 weeks, DFE/OCT  3,4. Epiretinal membrane, OD  - fairly thick ERM overlying retina with pucker and distorted foveal profile  - OCT stable from prior, but  BCVA OD drastically improved to 20/30 from 20/60 post cataract surgery -- today BCVA 20/25  - discussed finding and prognosis and treatment options in detail  - pt happy with vision OD and wishes to monitor ERM for now  - f/u 4-6 weeks  5. Moderate nonproliferative diabetic retinopathy w/o DME, OS  - exam shows scattered MA OS, no NV  - FA (02.17.20) shows no NV OS  - OCT without diabetic macular edema, OU  - monitor  6,7. Hypertensive retinopathy OU  - discussed importance of tight BP control  - monitor  8. Mixed form age related cataract OS  - The symptoms of cataract, surgical options, and treatments and risks were discussed with patient.  - discussed diagnosis and progression  - under the expert management of Dr. Loletha Grayer. [place]  9. Pseudophakia OD  - s/p CE/IOL (Dr. Shirleen Schirmer, Jun 10, 2018)  - beautiful surgery, doing well  - completing drop taper per Dr. Katy Fitch  - monitor     Ophthalmic Meds Ordered this visit:  Meds ordered this encounter  Medications  . prednisoLONE acetate (PRED FORTE) 1 % ophthalmic suspension    Sig: Place 1 drop into the right eye 4 (four) times daily for 7 days.    Dispense:  10 mL    Refill:  0       Return in about 6 weeks (around 09/20/2018) for f/u PDR OD, DFE, OCT.  There are no Patient Instructions on file for this visit.   Explained the diagnoses, plan, and follow up with the patient and they expressed understanding.  Patient expressed understanding of the importance of proper follow up care.   This document serves as a record of services personally performed by Gardiner Sleeper, MD, PhD. It was created on their behalf by Ernest Mallick, OA, an  ophthalmic assistant. The creation of this record is the provider's dictation and/or activities during the visit.    Electronically signed by: Ernest Mallick, OA 07.12.2020 1:12 PM     Gardiner Sleeper, M.D., Ph.D. Diseases & Surgery of the Retina and Vitreous [facility]  I have reviewed the above documentation for accuracy and completeness, and I agree with the above. Gardiner Sleeper, M.D., Ph.D. 08/09/18 1:14 PM     Abbreviations: M myopia (nearsighted); A astigmatism; H hyperopia (farsighted); P presbyopia; Mrx spectacle prescription;  CTL contact lenses; OD right eye; OS left eye; OU both eyes  XT exotropia; ET esotropia; PEK punctate epithelial keratitis; PEE punctate epithelial erosions; DES dry eye syndrome; MGD meibomian  gland dysfunction; ATs artificial tears; PFAT's preservative free artificial tears; [place] nuclear sclerotic cataract; PSC posterior subcapsular cataract; ERM epi-retinal membrane; PVD posterior vitreous detachment; RD retinal detachment; DM diabetes mellitus; DR diabetic retinopathy; NPDR non-proliferative diabetic retinopathy; PDR proliferative diabetic retinopathy; CSME clinically significant macular edema; DME diabetic macular edema; dbh dot blot hemorrhages; CWS cotton wool spot; POAG primary open angle glaucoma; C/D cup-to-disc ratio; HVF humphrey visual field; GVF goldmann visual field; OCT optical coherence tomography; IOP intraocular pressure; BRVO Branch retinal vein occlusion; CRVO central retinal vein occlusion; CRAO central retinal artery occlusion; BRAO branch retinal artery occlusion; RT retinal tear; SB scleral buckle; PPV pars plana vitrectomy; VH Vitreous hemorrhage; PRP panretinal laser photocoagulation; IVK intravitreal kenalog; VMT vitreomacular traction; MH Macular hole;  NVD neovascularization of the disc; NVE neovascularization elsewhere; AREDS age related eye disease study; ARMD age related macular degeneration; POAG primary open  angle glaucoma; EBMD epithelial/anterior basement membrane dystrophy; ACIOL anterior chamber intraocular lens; IOL intraocular lens; PCIOL posterior chamber intraocular lens; Phaco/IOL phacoemulsification with intraocular lens placement; [place] photorefractive keratectomy; LASIK laser assisted in situ keratomileusis; HTN hypertension; DM diabetes mellitus; COPD chronic obstructive pulmonary disease

## 2018-08-09 ENCOUNTER — Encounter (INDEPENDENT_AMBULATORY_CARE_PROVIDER_SITE_OTHER): Payer: Self-pay | Admitting: Ophthalmology

## 2018-08-09 ENCOUNTER — Ambulatory Visit (INDEPENDENT_AMBULATORY_CARE_PROVIDER_SITE_OTHER): Payer: Medicare Other | Admitting: Ophthalmology

## 2018-08-09 ENCOUNTER — Other Ambulatory Visit: Payer: Self-pay

## 2018-08-09 DIAGNOSIS — H25812 Combined forms of age-related cataract, left eye: Secondary | ICD-10-CM

## 2018-08-09 DIAGNOSIS — H35371 Puckering of macula, right eye: Secondary | ICD-10-CM

## 2018-08-09 DIAGNOSIS — I10 Essential (primary) hypertension: Secondary | ICD-10-CM

## 2018-08-09 DIAGNOSIS — Z961 Presence of intraocular lens: Secondary | ICD-10-CM

## 2018-08-09 DIAGNOSIS — E113393 Type 2 diabetes mellitus with moderate nonproliferative diabetic retinopathy without macular edema, bilateral: Secondary | ICD-10-CM

## 2018-08-09 DIAGNOSIS — H35033 Hypertensive retinopathy, bilateral: Secondary | ICD-10-CM

## 2018-08-09 DIAGNOSIS — H25813 Combined forms of age-related cataract, bilateral: Secondary | ICD-10-CM

## 2018-08-09 DIAGNOSIS — H34231 Retinal artery branch occlusion, right eye: Secondary | ICD-10-CM

## 2018-08-09 DIAGNOSIS — H3521 Other non-diabetic proliferative retinopathy, right eye: Secondary | ICD-10-CM

## 2018-08-09 DIAGNOSIS — H3581 Retinal edema: Secondary | ICD-10-CM

## 2018-08-09 MED ORDER — PREDNISOLONE ACETATE 1 % OP SUSP: 1.0000 [drp] | Freq: Four times a day (QID) | OPHTHALMIC | 0 refills | Status: AC

## 2018-08-10 ENCOUNTER — Telehealth (HOSPITAL_COMMUNITY): Payer: Self-pay

## 2018-08-10 ENCOUNTER — Encounter (INDEPENDENT_AMBULATORY_CARE_PROVIDER_SITE_OTHER): Payer: Medicare Other | Admitting: Ophthalmology

## 2018-08-12 ENCOUNTER — Telehealth (HOSPITAL_COMMUNITY): Payer: Self-pay

## 2018-08-12 NOTE — Telephone Encounter (Signed)
I called [name] to schedule an appointment. She did not answer so I left a message requesting she call me back when she is able.

## 2018-08-12 NOTE — Telephone Encounter (Signed)
I called [name] to schedule an appointment. Her phone went directly to a recording which stated that she is not currently accepting calls and to try back later. No option was available to leave a message. I will reach out again tomorrow.

## 2018-08-13 ENCOUNTER — Other Ambulatory Visit (HOSPITAL_COMMUNITY): Payer: Self-pay

## 2018-08-13 NOTE — Progress Notes (Signed)
I went to Ms Wilson today to see if she was available after not being able to reach her by phone for multiple days. She was walking out of the house as I arrived and stated her phone was messed up and she was on the way to an appointment to have it fixed. She asked if I would be able to come back at 18:15 but I told her I would be gone for the day at 18:00. I asked if she would be able to wait for me to come by  Next week but she said she would be running out of medications in her pillbox. She stated she had just enough time for me to fill one of her boxes so that is what I did. She did not have a sufficient amount of entresto to fill her box for the entire week but she had enough to get through the weekend. She also had not received Vyndamax via mail yet and she does not have the specialty pharmacy information for me to reach out. I asked that she keep an eye out for it in the mail over the weekend and if it doesn't come I will confer with the HF clinic to get the information. I will follow up on Tuesday.

## 2018-08-17 ENCOUNTER — Other Ambulatory Visit (HOSPITAL_COMMUNITY): Payer: Self-pay

## 2018-08-17 NOTE — Progress Notes (Signed)
Paramedicine Encounter    Patient ID: [name], female    DOB: 1950/10/14, 68 y.o.   MRN: [id number]   Patient Care Team: Jeanella Anton, NP as PCP - General (Nurse Practitioner) Jorge Ny, LCSW as Social Worker (Licensed Clinical Social Worker)  Patient Active Problem List   Diagnosis Date Noted  . Right foot infection 10/22/2017  . Cellulitis in diabetic foot ([place]) 10/22/2017  . Hyperglycemia 10/22/2017  . Lumbar radiculopathy 09/18/2017  . Degenerative spondylolisthesis 09/18/2017  . Atrial fibrillation ([place]) 02/11/2017  . Paroxysmal A-fib ([place])   . Biventricular automatic implantable cardioverter defibrillator in situ   . Sepsis ([employer]) 04/20/2016  . UTI (urinary tract infection) 04/20/2016  . Dyspnea 07/19/2015  . Interstitial lung disease ([place]) 11/20/2014  . SIRS (systemic inflammatory response syndrome) ([place]) 02/03/2014  . IDDM (insulin dependent diabetes mellitus) ([place]) 02/03/2014  . Tachycardia 06/14/2013  . Chronic systolic CHF (congestive heart failure) ([place]) 09/29/2012  . Hypoxia 03/06/2012  . Hypertensive heart disease 03/06/2012  . Nonischemic cardiomyopathy ([place]) 03/06/2012  . Tobacco abuse 03/06/2012  . Type 2 diabetes mellitus ([place]) 03/06/2012  . Hypertension 03/06/2012  . CAD (coronary artery disease), native coronary artery 03/06/2012  . Hypokalemia 03/06/2012  . Acute bronchitis 02/01/2009  . Sleep apnea 02/01/2009  . Chest pain 02/01/2009    Current Outpatient Medications:  .  acetaminophen (TYLENOL) 500 MG tablet, Take 1 tablet (500 mg total) by mouth every 6 (six) hours as needed., Disp: 30 tablet, Rfl: 0 .  albuterol (PROVENTIL HFA;VENTOLIN HFA) 108 (90 Base) MCG/ACT inhaler, Inhale 2 puffs into the lungs every 6 (six) hours as needed for wheezing or shortness of breath., Disp: 18 g, Rfl: 0 .  apixaban (ELIQUIS) 5 MG TABS tablet, Take 1 tablet (5 mg total) by mouth 2 (two) times daily., Disp: 60 tablet, Rfl: 3 .  atorvastatin (LIPITOR) 40  MG tablet, Take 1 tablet (40 mg total) by mouth daily., Disp: 30 tablet, Rfl: 11 .  baclofen (LIORESAL) 10 MG tablet, TK 1 T PO BID PRN, Disp: , Rfl:  .  BIDIL 20-37.5 MG tablet, TAKE 1 TABLET BY MOUTH THREE TIMES DAILY, Disp: 90 tablet, Rfl: 6 .  carvedilol (COREG) 25 MG tablet, TAKE 1 TABLET(25 MG) BY MOUTH TWICE DAILY WITH A MEAL, Disp: 180 tablet, Rfl: 3 .  ENTRESTO 97-103 MG, TAKE 1 TABLET BY MOUTH TWICE DAILY, Disp: 60 tablet, Rfl: 6 .  furosemide (LASIX) 40 MG tablet, Take 1 tablet (40 mg total) by mouth 2 (two) times daily., Disp: 180 tablet, Rfl: 3 .  gabapentin (NEURONTIN) 100 MG capsule, TK 1 C PO TID, Disp: , Rfl:  .  glucose blood (FREESTYLE TEST STRIPS) test strip, Use as instructed, Disp: 100 each, Rfl: 0 .  glucose monitoring kit (FREESTYLE) monitoring kit, 1 each by Does not apply route 4 (four) times daily - after meals and at bedtime. 1 month Diabetic Testing Supplies for QAC-QHS accuchecks., Disp: 1 each, Rfl: 1 .  HUMALOG KWIKPEN 100 UNIT/ML KwikPen, INJECT 10 UNITS INTO THE SKIN WITH EACH MEAL, Disp: , Rfl:  .  insulin aspart (NOVOLOG FLEXPEN) 100 UNIT/ML FlexPen, 0-20 Units, Subcutaneous, 3 times daily with meals CBG < 70: implement hypoglycemia protocol CBG 70 - 120: 0 units CBG 121 - 150: 3 units CBG 151 - 200: 4 units CBG 201 - 250: 7 units CBG 251 - 300: 11 units CBG 301 - 350: 15 units CBG 351 - 400: 20 units CBG > 400: call MD, Disp:  15 mL, Rfl: 0 .  Insulin Glargine (LANTUS SOLOSTAR) 100 UNIT/ML Solostar Pen, Inject 50 Units into the skin daily at 10 pm., Disp: 15 mL, Rfl: 0 .  Insulin Pen Needle 32G X 8 MM MISC, Use as directed, Disp: 100 each, Rfl: 0 .  Lancets (FREESTYLE) lancets, Use as instructed, Disp: 100 each, Rfl: 0 .  metFORMIN (GLUCOPHAGE) 1000 MG tablet, TAKE 1 TABLET(1000 MG) BY MOUTH TWICE DAILY WITH A MEAL, Disp: 60 tablet, Rfl: 0 .  metroNIDAZOLE (FLAGYL) 500 MG tablet, TK 2 TS PO D, Disp: , Rfl:  .  naproxen sodium (ALEVE) 220 MG tablet, Take 220 mg by  mouth daily as needed (for pain or headache). , Disp: , Rfl:  .  Oxycodone HCl 10 MG TABS, Take 10 mg by mouth 4 (four) times daily as needed (for pain)., Disp: , Rfl:  .  polyethylene glycol (MIRALAX / GLYCOLAX) packet, Take 17 g by mouth daily as needed for mild constipation. (Patient not taking: Reported on 03/25/2018), Disp: 14 each, Rfl: 0 .  Potassium Chloride ER 20 MEQ TBCR, TAKE 2 TABLETS BY MOUTH DAILY, Disp: 60 tablet, Rfl: 5 .  spironolactone (ALDACTONE) 25 MG tablet, TAKE 1 TABLET(25 MG) BY MOUTH DAILY, Disp: 30 tablet, Rfl: 3 .  Tafamidis (VYNDAMAX) 61 MG CAPS, Take 61 mg by mouth daily. (Patient not taking: Reported on 08/13/2018), Disp: 30 capsule, Rfl: 11 .  valACYclovir (VALTREX) 1000 MG tablet, Take 1 tablet (1,000 mg total) by mouth 3 (three) times daily. (Patient not taking: Reported on 04/20/2018), Disp: 21 tablet, Rfl: 0 .  Vitamin D, Ergocalciferol, (DRISDOL) 1.25 MG (50000 UT) CAPS capsule, TK 1 C PO WEEKLY, Disp: , Rfl:  Allergies  Allergen Reactions  . Morphine And Related Nausea Only    Severe nausea  . Penicillins Other (See Comments)    Pt states she has had a pain in her leg since a penicillin injection 2 months ago (reported 08/31/17).  Has tolerated amoxicillin oral.  Has patient had a PCN reaction causing immediate rash, facial/tongue/throat swelling, SOB or lightheadedness with hypotension: No Has patient had a PCN reaction causing severe rash involving mucus membranes or skin necrosis: No Has patient had a PCN reaction that required hospitalization: No Has patient had a PCN reaction occurring within the last 10 years: Yes If a      Social History   Socioeconomic History  . Marital status: Single    Spouse name: Not on file  . Number of children: 4  . Years of education: 57  . Highest education level: Not on file  Occupational History  . Not on file  Social Needs  . Financial resource strain: Not hard at all  . Food insecurity    Worry: Never true     Inability: Never true  . Transportation needs    Medical: No    Non-medical: No  Tobacco Use  . Smoking status: Former Smoker    Packs/day: 1.00    Years: 40.00    Pack years: 40.00    Types: Cigarettes    Start date: 06/23/1972    Quit date: 03/26/2012    Years since quitting: 6.3  . Smokeless tobacco: Never Used  Substance and Sexual Activity  . Alcohol use: No  . Drug use: No  . Sexual activity: Yes  Lifestyle  . Physical activity    Days per week: Not on file    Minutes per session: Not on file  . Stress: Not on file  Relationships  . Social Herbalist on phone: Not on file    Gets together: Not on file    Attends religious service: Not on file    Active member of club or organization: Not on file    Attends meetings of clubs or organizations: Not on file    Relationship status: Not on file  . Intimate partner violence    Fear of current or ex partner: Not on file    Emotionally abused: Not on file    Physically abused: Not on file    Forced sexual activity: Not on file  Other Topics Concern  . Not on file  Social History Narrative  . Not on file    Physical Exam Cardiovascular:     Rate and Rhythm: Normal rate and regular rhythm.     Pulses: Normal pulses.  Pulmonary:     Effort: Pulmonary effort is normal.     Breath sounds: Normal breath sounds.  Musculoskeletal: Normal range of motion.     Right lower leg: Edema present.     Left lower leg: Edema present.  Skin:    General: Skin is warm and dry.     Capillary Refill: Capillary refill takes less than 2 seconds.  Neurological:     Mental Status: She is alert and oriented to person, place, and time.  Psychiatric:        Mood and Affect: Mood normal.         Future Appointments  Date Time Provider [place]  09/06/2018  7:35 AM [facility]  09/10/2018  8:40 AM Larey Dresser, MD [facility] None  09/20/2018  8:30 AM Bernarda Caffey, MD [facility]  None    BP 128/71 (BP Location: Left Arm, Patient Position: Sitting, Cuff Size: Normal)   Pulse 93   Resp 16   Wt 222 lb 8 oz (100.9 kg)   SpO2 98%   BMI 34.85 kg/m   Weight yesterday- Did not weigh Last visit weight- 223 lb  [name] was seen at home today and reported feeling generally well. Initially she was found to be SOB and diaphoretic however she stated she had been in the kitchen cooking on the stove just before I arrived. As we sat together, her respiratory effort lessened and her skin dried. She denied having any other episodes of SOB since we last saw one another and she also denied chest pain, headache, dizziness, orthopnea, cough or fever. She stated she had been out of town over the weekend which led to her not being compliant with her medications. She missed several evening doses and one morning dose of her medications. She still had not received Vyndamax from the specialty pharmacy so I got them on the phone with the help of the HF clinic staff and arranged for a delivery tomorrow. Her medications were verified and her pillbox was refilled. I will follow up tomorrow or Thursday to add the Vyndamax to her box.   [name], EMT 08/17/18  ACTION: Home visit completed Next visit planned for 1-2 days to add medication to her pillbox.

## 2018-08-23 ENCOUNTER — Encounter (HOSPITAL_COMMUNITY): Payer: Medicare Other | Admitting: Cardiology

## 2018-08-24 ENCOUNTER — Telehealth (HOSPITAL_COMMUNITY): Payer: Self-pay

## 2018-08-24 NOTE — Telephone Encounter (Signed)
I called [name] to schedule an appointment. She did not answer so I left a message requesting she call me back.

## 2018-09-01 ENCOUNTER — Other Ambulatory Visit (HOSPITAL_COMMUNITY): Payer: Self-pay

## 2018-09-01 NOTE — Progress Notes (Signed)
Paramedicine Encounter    Patient ID: GHISLAINE HARCUM, female    DOB: [birth year], 68 y.o.   MRN: [id number]   Patient Care Team: Jeanella Anton, NP as PCP - General (Nurse Practitioner) Jorge Ny, LCSW as Social Worker (Licensed Clinical Social Worker)  Patient Active Problem List   Diagnosis Date Noted  . Right foot infection 10/22/2017  . Cellulitis in diabetic foot ([place]) 10/22/2017  . Hyperglycemia 10/22/2017  . Lumbar radiculopathy 09/18/2017  . Degenerative spondylolisthesis 09/18/2017  . Atrial fibrillation ([place]) 02/11/2017  . Paroxysmal A-fib ([place])   . Biventricular automatic implantable cardioverter defibrillator in situ   . Sepsis ([place]) 04/20/2016  . UTI (urinary tract infection) 04/20/2016  . Dyspnea 07/19/2015  . Interstitial lung disease ([place]) 11/20/2014  . SIRS (systemic inflammatory response syndrome) ([place]) 02/03/2014  . IDDM (insulin dependent diabetes mellitus) ([place]) 02/03/2014  . Tachycardia 06/14/2013  . Chronic systolic CHF (congestive heart failure) ([place]) 09/29/2012  . Hypoxia 03/06/2012  . Hypertensive heart disease 03/06/2012  . Nonischemic cardiomyopathy ([place]) 03/06/2012  . Tobacco abuse 03/06/2012  . Type 2 diabetes mellitus ([place]) 03/06/2012  . Hypertension 03/06/2012  . CAD (coronary artery disease), native coronary artery 03/06/2012  . Hypokalemia 03/06/2012  . Acute bronchitis 02/01/2009  . Sleep apnea 02/01/2009  . Chest pain 02/01/2009    Current Outpatient Medications:  .  acetaminophen (TYLENOL) 500 MG tablet, Take 1 tablet (500 mg total) by mouth every 6 (six) hours as needed., Disp: 30 tablet, Rfl: 0 .  albuterol (PROVENTIL HFA;VENTOLIN HFA) 108 (90 Base) MCG/ACT inhaler, Inhale 2 puffs into the lungs every 6 (six) hours as needed for wheezing or shortness of breath., Disp: 18 g, Rfl: 0 .  apixaban (ELIQUIS) 5 MG TABS tablet, Take 1 tablet (5 mg total) by mouth 2 (two) times daily., Disp: 60 tablet, Rfl: 3 .  atorvastatin (LIPITOR) 40  MG tablet, Take 1 tablet (40 mg total) by mouth daily., Disp: 30 tablet, Rfl: 11 .  BIDIL 20-37.5 MG tablet, TAKE 1 TABLET BY MOUTH THREE TIMES DAILY, Disp: 90 tablet, Rfl: 6 .  carvedilol (COREG) 25 MG tablet, TAKE 1 TABLET(25 MG) BY MOUTH TWICE DAILY WITH A MEAL, Disp: 180 tablet, Rfl: 3 .  ENTRESTO 97-103 MG, TAKE 1 TABLET BY MOUTH TWICE DAILY, Disp: 60 tablet, Rfl: 6 .  furosemide (LASIX) 40 MG tablet, Take 1 tablet (40 mg total) by mouth 2 (two) times daily., Disp: 180 tablet, Rfl: 3 .  gabapentin (NEURONTIN) 100 MG capsule, TK 1 C PO TID, Disp: , Rfl:  .  HUMALOG KWIKPEN 100 UNIT/ML KwikPen, INJECT 10 UNITS INTO THE SKIN WITH EACH MEAL, Disp: , Rfl:  .  insulin aspart (NOVOLOG FLEXPEN) 100 UNIT/ML FlexPen, 0-20 Units, Subcutaneous, 3 times daily with meals CBG < 70: implement hypoglycemia protocol CBG 70 - 120: 0 units CBG 121 - 150: 3 units CBG 151 - 200: 4 units CBG 201 - 250: 7 units CBG 251 - 300: 11 units CBG 301 - 350: 15 units CBG 351 - 400: 20 units CBG > 400: call MD, Disp: 15 mL, Rfl: 0 .  Insulin Glargine (LANTUS SOLOSTAR) 100 UNIT/ML Solostar Pen, Inject 50 Units into the skin daily at 10 pm., Disp: 15 mL, Rfl: 0 .  metFORMIN (GLUCOPHAGE) 1000 MG tablet, TAKE 1 TABLET(1000 MG) BY MOUTH TWICE DAILY WITH A MEAL, Disp: 60 tablet, Rfl: 0 .  Potassium Chloride ER 20 MEQ TBCR, TAKE 2 TABLETS BY MOUTH DAILY, Disp: 60 tablet, Rfl: 5 .  spironolactone (ALDACTONE) 25 MG tablet, TAKE 1 TABLET(25 MG) BY MOUTH DAILY, Disp: 30 tablet, Rfl: 3 .  Tafamidis (VYNDAMAX) 61 MG CAPS, Take 61 mg by mouth daily., Disp: 30 capsule, Rfl: 11 .  Vitamin D, Ergocalciferol, (DRISDOL) 1.25 MG (50000 UT) CAPS capsule, TK 1 C PO WEEKLY, Disp: , Rfl:  .  baclofen (LIORESAL) 10 MG tablet, TK 1 T PO BID PRN, Disp: , Rfl:  .  glucose blood (FREESTYLE TEST STRIPS) test strip, Use as instructed, Disp: 100 each, Rfl: 0 .  glucose monitoring kit (FREESTYLE) monitoring kit, 1 each by Does not apply route 4 (four) times  daily - after meals and at bedtime. 1 month Diabetic Testing Supplies for QAC-QHS accuchecks., Disp: 1 each, Rfl: 1 .  Insulin Pen Needle 32G X 8 MM MISC, Use as directed, Disp: 100 each, Rfl: 0 .  Lancets (FREESTYLE) lancets, Use as instructed, Disp: 100 each, Rfl: 0 .  metroNIDAZOLE (FLAGYL) 500 MG tablet, TK 2 TS PO D, Disp: , Rfl:  .  naproxen sodium (ALEVE) 220 MG tablet, Take 220 mg by mouth daily as needed (for pain or headache). , Disp: , Rfl:  .  Oxycodone HCl 10 MG TABS, Take 10 mg by mouth 4 (four) times daily as needed (for pain)., Disp: , Rfl:  .  polyethylene glycol (MIRALAX / GLYCOLAX) packet, Take 17 g by mouth daily as needed for mild constipation. (Patient not taking: Reported on 03/25/2018), Disp: 14 each, Rfl: 0 .  valACYclovir (VALTREX) 1000 MG tablet, Take 1 tablet (1,000 mg total) by mouth 3 (three) times daily. (Patient not taking: Reported on 04/20/2018), Disp: 21 tablet, Rfl: 0 Allergies  Allergen Reactions  . Morphine And Related Nausea Only    Severe nausea  . Penicillins Other (See Comments)    Pt states she has had a pain in her leg since a penicillin injection 2 months ago (reported 08/31/17).  Has tolerated amoxicillin oral.  Has patient had a PCN reaction causing immediate rash, facial/tongue/throat swelling, SOB or lightheadedness with hypotension: No Has patient had a PCN reaction causing severe rash involving mucus membranes or skin necrosis: No Has patient had a PCN reaction that required hospitalization: No Has patient had a PCN reaction occurring within the last 10 years: Yes If a      Social History   Socioeconomic History  . Marital status: Single    Spouse name: Not on file  . Number of children: 4  . Years of education: 86  . Highest education level: Not on file  Occupational History  . Not on file  Social Needs  . Financial resource strain: Not hard at all  . Food insecurity    Worry: Never true    Inability: Never true  . Transportation  needs    Medical: No    Non-medical: No  Tobacco Use  . Smoking status: Former Smoker    Packs/day: 1.00    Years: 40.00    Pack years: 40.00    Types: Cigarettes    Start date: 06/23/1972    Quit date: 03/26/2012    Years since quitting: 6.4  . Smokeless tobacco: Never Used  Substance and Sexual Activity  . Alcohol use: No  . Drug use: No  . Sexual activity: Yes  Lifestyle  . Physical activity    Days per week: Not on file    Minutes per session: Not on file  . Stress: Not on file  Relationships  . Social connections  Talks on phone: Not on file    Gets together: Not on file    Attends religious service: Not on file    Active member of club or organization: Not on file    Attends meetings of clubs or organizations: Not on file    Relationship status: Not on file  . Intimate partner violence    Fear of current or ex partner: Not on file    Emotionally abused: Not on file    Physically abused: Not on file    Forced sexual activity: Not on file  Other Topics Concern  . Not on file  Social History Narrative  . Not on file    Physical Exam Cardiovascular:     Rate and Rhythm: Normal rate and regular rhythm.     Pulses: Normal pulses.  Pulmonary:     Effort: Pulmonary effort is normal.  Abdominal:     General: There is no distension.  Musculoskeletal:     Right lower leg: No edema.     Left lower leg: No edema.  Skin:    General: Skin is warm and dry.     Capillary Refill: Capillary refill takes less than 2 seconds.  Neurological:     Mental Status: She is alert and oriented to person, place, and time.  Psychiatric:        Mood and Affect: Mood normal.         Future Appointments  Date Time Provider [place]  09/06/2018  7:35 AM [facility]  09/10/2018  8:40 AM Larey Dresser, MD [facility] None  09/20/2018  8:30 AM Bernarda Caffey, MD [facility] None    BP (!) 91/54 (BP Location: Left Arm, Patient Position:  Sitting, Cuff Size: Normal)   Pulse 94   Resp 16   Wt 227 lb (103 kg)   SpO2 99%   BMI 35.55 kg/m   Weight yesterday- n/a Last visit weight- 222.5 lb  Ms Fei was seen at home today and reported feeling generally well but also tired. She stated she had been cooking all morning and that had worn her out. She denied chest pain, SOB, headache, dizziness, orthopnea, cough or fever since our last visit. I last saw her 16 days ago and she still had 2 1/2 days worth of medications in her second pillbox. She said she tries to remember her medications and her family also stays on her about it but she still occasionally forgets. Her medications were verified and her pillbox was refilled. She is leaving for vacation later tonight so I will follow up in two weeks.   [name], EMT 09/01/18  ACTION: Home visit completed Next visit planned for 2 weeks

## 2018-09-06 ENCOUNTER — Ambulatory Visit (INDEPENDENT_AMBULATORY_CARE_PROVIDER_SITE_OTHER): Payer: Medicare Other | Admitting: *Deleted

## 2018-09-06 DIAGNOSIS — I5022 Chronic systolic (congestive) heart failure: Secondary | ICD-10-CM

## 2018-09-06 DIAGNOSIS — I428 Other cardiomyopathies: Secondary | ICD-10-CM

## 2018-09-06 LAB — CUP PACEART REMOTE DEVICE CHECK
Battery Remaining Longevity: 26 mo
Battery Voltage: 2.91 V
Brady Statistic AP VP Percent: 0.02 %
Brady Statistic AP VS Percent: 0.01 %
Brady Statistic AS VP Percent: 98.65 %
Brady Statistic AS VS Percent: 1.31 %
Brady Statistic RA Percent Paced: 0.03 %
Brady Statistic RV Percent Paced: 7.18 %
Date Time Interrogation Session: 20200810041706
HighPow Impedance: 69 Ohm
Implantable Lead Implant Date: 20141023
Implantable Lead Implant Date: 20141023
Implantable Lead Implant Date: 20141023
Implantable Lead Location: 753858
Implantable Lead Location: 753859
Implantable Lead Location: 753860
Implantable Lead Model: 4298
Implantable Lead Model: 5076
Implantable Lead Model: 6935
Implantable Pulse Generator Implant Date: 20141023
Lead Channel Impedance Value: 342 Ohm
Lead Channel Impedance Value: 342 Ohm
Lead Channel Impedance Value: 361 Ohm
Lead Channel Impedance Value: 399 Ohm
Lead Channel Impedance Value: 418 Ohm
Lead Channel Impedance Value: 456 Ohm
Lead Channel Impedance Value: 475 Ohm
Lead Channel Impedance Value: 513 Ohm
Lead Channel Impedance Value: 551 Ohm
Lead Channel Impedance Value: 589 Ohm
Lead Channel Impedance Value: 608 Ohm
Lead Channel Impedance Value: 646 Ohm
Lead Channel Impedance Value: 665 Ohm
Lead Channel Pacing Threshold Amplitude: 0.5 V
Lead Channel Pacing Threshold Amplitude: 0.625 V
Lead Channel Pacing Threshold Amplitude: 1 V
Lead Channel Pacing Threshold Pulse Width: 0.4 ms
Lead Channel Pacing Threshold Pulse Width: 0.4 ms
Lead Channel Pacing Threshold Pulse Width: 0.4 ms
Lead Channel Sensing Intrinsic Amplitude: 11.25 mV
Lead Channel Sensing Intrinsic Amplitude: 11.25 mV
Lead Channel Sensing Intrinsic Amplitude: 2.875 mV
Lead Channel Sensing Intrinsic Amplitude: 2.875 mV
Lead Channel Setting Pacing Amplitude: 1.75 V
Lead Channel Setting Pacing Amplitude: 2 V
Lead Channel Setting Pacing Amplitude: 2.5 V
Lead Channel Setting Pacing Pulse Width: 0.4 ms
Lead Channel Setting Pacing Pulse Width: 0.4 ms
Lead Channel Setting Sensing Sensitivity: 0.45 mV

## 2018-09-09 ENCOUNTER — Telehealth (HOSPITAL_COMMUNITY): Payer: Self-pay

## 2018-09-09 NOTE — Telephone Encounter (Signed)
I called [name] to remind her of her appointment at the HF clinic tomorrow. She stated she was supposed to be at the eye center during the same time because she had surgery done today on her eye. I checked her chart and saw that the eye center does not have her coming back in until 8/24 and the only appointment scheduled for tomorrow is at the HF clinic. [name] was adamant  That she has an appointment at the eye doctor in the morning and said she has the discharge summary to prove it. I asked her to double check but she said it was in her car which someone was using at the moment. I advised that she call the eye center tomorrow morning and confirm the appointment before she goes and if necessary we can reschedule her clinic appointment. She was understanding and agreeable. I will follow up in the morning.

## 2018-09-10 ENCOUNTER — Encounter (HOSPITAL_COMMUNITY): Payer: Self-pay | Admitting: Cardiology

## 2018-09-10 ENCOUNTER — Other Ambulatory Visit (HOSPITAL_COMMUNITY): Payer: Self-pay

## 2018-09-10 ENCOUNTER — Ambulatory Visit (HOSPITAL_COMMUNITY)
Admission: RE | Admit: 2018-09-10 | Discharge: 2018-09-10 | Disposition: A | Discharge status: Home / Self Care | Payer: Medicare Other | Source: Ambulatory Visit | Attending: Cardiology | Admitting: Cardiology

## 2018-09-10 ENCOUNTER — Other Ambulatory Visit: Payer: Self-pay

## 2018-09-10 VITALS — BP 153/79 | HR 86 | Wt 231.3 lb

## 2018-09-10 DIAGNOSIS — I5022 Chronic systolic (congestive) heart failure: Secondary | ICD-10-CM

## 2018-09-10 DIAGNOSIS — I43 Cardiomyopathy in diseases classified elsewhere: Secondary | ICD-10-CM | POA: Insufficient documentation

## 2018-09-10 DIAGNOSIS — Z794 Long term (current) use of insulin: Secondary | ICD-10-CM | POA: Insufficient documentation

## 2018-09-10 DIAGNOSIS — E785 Hyperlipidemia, unspecified: Secondary | ICD-10-CM | POA: Diagnosis not present

## 2018-09-10 DIAGNOSIS — I480 Paroxysmal atrial fibrillation: Secondary | ICD-10-CM | POA: Diagnosis not present

## 2018-09-10 DIAGNOSIS — E1140 Type 2 diabetes mellitus with diabetic neuropathy, unspecified: Secondary | ICD-10-CM | POA: Insufficient documentation

## 2018-09-10 DIAGNOSIS — I110 Hypertensive heart disease with heart failure: Secondary | ICD-10-CM | POA: Insufficient documentation

## 2018-09-10 DIAGNOSIS — Z7901 Long term (current) use of anticoagulants: Secondary | ICD-10-CM | POA: Diagnosis not present

## 2018-09-10 DIAGNOSIS — I10 Essential (primary) hypertension: Secondary | ICD-10-CM

## 2018-09-10 DIAGNOSIS — Z87891 Personal history of nicotine dependence: Secondary | ICD-10-CM | POA: Insufficient documentation

## 2018-09-10 DIAGNOSIS — I2510 Atherosclerotic heart disease of native coronary artery without angina pectoris: Secondary | ICD-10-CM | POA: Insufficient documentation

## 2018-09-10 DIAGNOSIS — E854 Organ-limited amyloidosis: Secondary | ICD-10-CM | POA: Diagnosis not present

## 2018-09-10 DIAGNOSIS — I428 Other cardiomyopathies: Secondary | ICD-10-CM | POA: Diagnosis not present

## 2018-09-10 DIAGNOSIS — Z8249 Family history of ischemic heart disease and other diseases of the circulatory system: Secondary | ICD-10-CM | POA: Insufficient documentation

## 2018-09-10 DIAGNOSIS — Z96651 Presence of right artificial knee joint: Secondary | ICD-10-CM | POA: Insufficient documentation

## 2018-09-10 DIAGNOSIS — Z79899 Other long term (current) drug therapy: Secondary | ICD-10-CM | POA: Diagnosis not present

## 2018-09-10 LAB — BASIC METABOLIC PANEL
Anion gap: 10 (ref 5–15)
BUN: 11 mg/dL (ref 8–23)
CO2: 25 mmol/L (ref 22–32)
Calcium: 9.3 mg/dL (ref 8.9–10.3)
Chloride: 105 mmol/L (ref 98–111)
Creatinine, Ser: 0.78 mg/dL (ref 0.44–1.00)
GFR calc Af Amer: >60 mL/min (ref >60)
GFR calc non Af Amer: >60 mL/min (ref >60)
Glucose, Bld: 284 mg/dL — ABNORMAL HIGH (ref 70–99)
Potassium: 3.8 mmol/L (ref 3.5–5.1)
Sodium: 140 mmol/L (ref 135–145)

## 2018-09-10 MED ORDER — ISOSORB DINITRATE-HYDRALAZINE 20-37.5 MG PO TABS: 2.0000 | ORAL_TABLET | Freq: Three times a day (TID) | ORAL | 6 refills | Status: DC

## 2018-09-10 NOTE — Patient Instructions (Signed)
INCREASE BIDIL to 2 tabs three times a day  EKG Completed  Labs today We will only contact you if something comes back abnormal or we need to make some changes. Otherwise no news is good news!  Your physician recommends that you schedule a follow-up appointment in: 3 months with an ECHO and visit with Dr Aundra Dubin  At the [facility], you and your health needs are our priority. As part of our continuing mission to provide you with exceptional heart care, we have created designated Provider Care Teams. These Care Teams include your primary Cardiologist (physician) and Advanced Practice Providers (APPs- Physician Assistants and Nurse Practitioners) who all work together to provide you with the care you need, when you need it.   You may see any of the following providers on your designated Care Team at your next follow up: Marland Kitchen Dr Glori Bickers . Dr [name] . Darrick Grinder, NP   Please be sure to bring in all your medications bottles to every appointment.

## 2018-09-10 NOTE — Progress Notes (Signed)
Paramedicine Encounter   Patient ID: [name] , female,   DOB: 02/08/1950,67 y.o.,  MRN: [id number]  [name] was seen in clinic with Dr Aundra Dubin and reported feeling well. Per Dr Aundra Dubin, her bidil was increased to 2 tablets TID. Her medications were placed in her pillbox and all necessary refills were ordered. I will follow up at the beginning of next week to fill her other box.  [name], EMT 09/10/2018   ACTION: Next visit planned for next week

## 2018-09-11 NOTE — Progress Notes (Signed)
Patient ID: [name], female   DOB: 1951/01/03, 68 y.o.   MRN: [id number]   Advanced Heart Failure Clinic Note   PCP: Dr. Alyson Ingles Cardiology: Dr Dora Sims is a 68 y.o. female with history of nonischemic cardiomyopathy.  She was admitted with CHF exacerbation in 02/2012.  EF 20-25% on echo, LHC with nonobstructive CAD.  She was started on cardiac meds and discharged.  In 07/2012, she was admitted again with CHF exacerbation.  She had run out of Lasix.  She was taking her other heart medications as ordered, however.  She was diuresed and discharged. She had a chronic LBBB, and Medtronic CRT-D device was placed in 10/14.  She was admitted in 6/16 with hypertensive emergency and CHF exacerbation.   Also of note, she had PFTs in 9/16 showing restrictive spirometry with low lung volume and DLCO.  She was supposed to get a high resolution CT to evaluate for interstitial lung disease but never had the study.     Admitted March 2018 with urosepsis--> E Coli Bacteremia. Completed antibiotic course. EF was down from previous to 30-35%. Also had atrial fibrillation so she was loaded on amiodarone. Placed on eliquis. Discharge weight was 208 pounds.  She is now off amiodarone.   Admitted to [facility] 1/16 -> 02/13/16 with CP and dizziness. Found to be in Afib. Converted spontaneously overnight with BB and diuresis.   Echo 11/19 showed EF 35-40%.  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   She returns for followup of CHF.  She is followed by paramedicine and has been compliant with all her meds.  SBP generally running around 150 still.  Weight is up about 9 lbs since last appointment.  She says she feels great.  No dyspnea walking on flat ground or up a flight of stairs.  No orthopnea/PND.  No chest pain.  No palpitations.     Medtronic device interrogation: Stable thoracic impedance, ?short runs of atrial fibrillation (rare).    ECG  (personally reviewed): NSR, BiV paced  Labs (2/14): SPEP negative, UPEP negative, HIV negative Labs (04/14/2017): K 3.8 Creatinine 0.94  Labs (9/19): K 3.3, creatinine 0.86 Labs (1/20): K 4, creatinine 0.87 Labs (2/20): K 4.2, creatinine 1.16  PMH: 1. HTN 2. Type II diabetes with neuropathy 3. Nonischemic cardiomyopathy: ? Due to HTN versus LBBB CMP.  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  Echo (2/14) with EF 20-25%.  Echo (7/14) with EF 25%, diffuse hypokinesis.  HIV, SPEP, UPEP negative.  Has LBBB. CRT-D 10/2012 (Medtronic).  Echo (4/15) with EF 45-50%, mild diffuse hypokinesis, PA systolic pressure 38 mmHg.  Echo (6/16) with EF 40-45%, mild LVH, septal and inferior hypokinesis.   - Echo (3/18): EF 30-35%. Grade 1 DD - Echo (6/18): EF 45-50%, moderate LVH, normal RV size with mildly decreased systolic function.  - Echo (11/19): EF 35-40%, moderate LVH, moderate diastolic dysfunction, normal RV size with mildly decreased systolic function, PASP 43 mmHg.  4. Chronic LBBB 5. Right TKR 6. Bilateral THR.  7. Hyperlipidemia 8. ?COPD: Has oxygen for use with exertion.  - PFTs (9/16) with FEV1 77%, FVC 76%, ratio 101%, TLC 63%, DLCO 41% => moderate restrictive deficit  9. Atrial fibrillation: Paroxysmal.  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   SH: Prior smoker, quit 2/14.  Never drank ETOH.  No drugs. Lives with son.  FH: Mother with "heart trouble."   Review of systems complete and found to be negative unless listed in HPI.    Current Outpatient Medications  Medication Sig Dispense Refill  . acetaminophen (TYLENOL) 500 MG tablet Take 1 tablet (500 mg total) by mouth every 6 (six) hours as needed. 30 tablet 0  . albuterol (PROVENTIL HFA;VENTOLIN HFA) 108 (90 Base) MCG/ACT inhaler Inhale 2 puffs into the lungs every 6 (six) hours as needed for wheezing or shortness of breath. 18 g 0  . apixaban  (ELIQUIS) 5 MG TABS tablet Take 1 tablet (5 mg total) by mouth 2 (two) times daily. 60 tablet 3  . atorvastatin (LIPITOR) 40 MG tablet Take 1 tablet (40 mg total) by mouth daily. 30 tablet 11  . baclofen (LIORESAL) 10 MG tablet TK 1 T PO BID PRN    . carvedilol (COREG) 25 MG tablet TAKE 1 TABLET(25 MG) BY MOUTH TWICE DAILY WITH A MEAL 180 tablet 3  . ENTRESTO 97-103 MG TAKE 1 TABLET BY MOUTH TWICE DAILY 60 tablet 6  . furosemide (LASIX) 40 MG tablet Take 1 tablet (40 mg total) by mouth 2 (two) times daily. 180 tablet 3  . gabapentin (NEURONTIN) 100 MG capsule TK 1 C PO TID    . glucose blood (FREESTYLE TEST STRIPS) test strip Use as instructed 100 each 0  . glucose monitoring kit (FREESTYLE) monitoring kit 1 each by Does not apply route 4 (four) times daily - after meals and at bedtime. 1 month Diabetic Testing Supplies for QAC-QHS accuchecks. 1 each 1  . HUMALOG KWIKPEN 100 UNIT/ML KwikPen INJECT 10 UNITS INTO THE SKIN WITH EACH MEAL    . insulin aspart (NOVOLOG FLEXPEN) 100 UNIT/ML FlexPen 0-20 Units, Subcutaneous, 3 times daily with meals CBG < 70: implement hypoglycemia protocol CBG 70 - 120: 0 units CBG 121 - 150: 3 units CBG 151 - 200: 4 units CBG 201 - 250: 7 units CBG 251 - 300: 11 units CBG 301 - 350: 15 units CBG 351 - 400: 20 units CBG > 400: call MD 15 mL 0  . Insulin Glargine (LANTUS SOLOSTAR) 100 UNIT/ML Solostar Pen Inject 50 Units into the skin daily at 10 pm. 15 mL 0  . Insulin Pen Needle 32G X 8 MM MISC Use as directed 100 each 0  . Lancets (FREESTYLE) lancets Use as instructed 100 each 0  . metFORMIN (GLUCOPHAGE) 1000 MG tablet TAKE 1 TABLET(1000 MG) BY MOUTH TWICE DAILY WITH A MEAL 60 tablet 0  . naproxen sodium (ALEVE) 220 MG tablet Take 220 mg by mouth daily as needed (for pain or headache).     . Oxycodone HCl 10 MG TABS Take 10 mg by mouth 4 (four) times daily as needed (for pain).    . polyethylene glycol (MIRALAX / GLYCOLAX) packet Take 17 g by mouth daily as  needed for mild constipation. 14 each 0  . Potassium Chloride ER 20 MEQ TBCR TAKE 2 TABLETS BY MOUTH DAILY 60 tablet 5  . spironolactone (ALDACTONE) 25 MG tablet TAKE 1 TABLET(25 MG) BY MOUTH DAILY 30 tablet 3  . Tafamidis (VYNDAMAX) 61 MG CAPS Take 61 mg by mouth daily. 30 capsule 11  . isosorbide-hydrALAZINE (BIDIL) 20-37.5 MG tablet Take 2 tablets by mouth 3 (three) times daily. 180 tablet 6   No current facility-administered medications for this encounter.    Vitals:   09/10/18 0901  BP: (!) 153/79  Pulse: 86  SpO2: 98%  Weight: 104.9 kg (231 lb  4.8 oz)    Wt Readings from Last 3 Encounters:  09/10/18 104.9 kg (231 lb 4.8 oz)  09/01/18 103 kg (227 lb)  08/17/18 100.9 kg (222 lb 8 oz)    Physical exam General: NAD Neck: No JVD, no thyromegaly or thyroid nodule.  Lungs: Clear to auscultation bilaterally with normal respiratory effort. CV: Nondisplaced PMI.  Heart regular S1/S2, no S3/S4, no murmur.  No peripheral edema.  No carotid bruit.  Normal pedal pulses.  Abdomen: Soft, nontender, no hepatosplenomegaly, no distention.  Skin: Intact without lesions or rashes.  Neurologic: Alert and oriented x 3.  Psych: Normal affect. Extremities: No clubbing or cyanosis.  HEENT: Normal.   Assessment/Plan: 1. Chronic systolic CHF: Nonischemic cardiomyopathy, thought to be related to HTN. S/P CRT-D (Medtronic). Echo 11/19 showed EF 35-40%, moderate LVH. She is not volume overloaded by Optivol or exam.  NYHA class II.  - Continue Coreg 25 mg BID.  - Continue Entresto 97/103 mg BID - Increase Bidil to 2 tabs TID - Continue spiro 25 mg daily.  - Continue Lasix 40 mg bid.  BMET today.  - Continue paramedicine.   - Echo at followup in 3 months.  2. Hyperlipidemia: Continue statin.   3. HTN: BP has been elevated, increase Bidil to 2 tabs tid.  4. PAF: Rare short runs of atrial fibrillation on device interrogation.   - Continue Eliquis for anticoagulation.  5. Cardiac amyloidosis: PYP  scan strongly suggestive of transthyretin cardiac amyloidosis. Genetic testing was negative (wild type).  - Continue tafamidis.   Followup in 3 months with echo.    [name], MD  09/11/2018

## 2018-09-14 NOTE — Progress Notes (Signed)
Remote ICD transmission.   

## 2018-09-15 ENCOUNTER — Telehealth (HOSPITAL_COMMUNITY): Payer: Self-pay

## 2018-09-15 NOTE — Telephone Encounter (Signed)
I arrived at [facility] [place] for our scheduled appointment. She was not home so I called and she stated she forgot I was coming and went to see family. He son was home and she had him let me in so I could fill her pillbox. I will follow up next week.

## 2018-09-16 ENCOUNTER — Other Ambulatory Visit (HOSPITAL_COMMUNITY): Payer: Self-pay | Admitting: Cardiology

## 2018-09-16 NOTE — Progress Notes (Signed)
[facility] Note  09/20/2018     CHIEF COMPLAINT Patient presents for Retina Follow Up   HISTORY OF PRESENT ILLNESS: [name] is a 68 y.o. female who presents to the clinic today for:   HPI    Retina Follow Up    Patient presents with  Diabetic Retinopathy.  In right eye.  Severity is moderate.  Duration of 6 weeks.  I, the attending physician,  performed the HPI with the patient and updated documentation appropriately.          Comments    Last a1c unknown. Had CE with IOL OS 1-2 weeks ago with Dr. Katy Fitch. Vision improved OS since cataract surgery. Patient confused about post-op gtts. Instructed patient to call Dr. Zenia Resides office with regard to gtt instructions. BS was 128 yesterday am. Last a1c unknown.        Last edited by Bernarda Caffey, MD on 09/20/2018  8:11 AM. (History)    pt states she had cataract sx last week on her left eye with Dr. Midge Aver, pt is unsure about which eye drops she is supposed to be using  Referring physician: Calton Dach, MD [id number],  [place] [address]  HISTORICAL INFORMATION:   Selected notes from the MEDICAL RECORD NUMBER Referred by Dr. Thurston Hole for concern of BRVO OD / ERM OD LEE: 02.10.20 (S. Bernstorf) [BCVA: OD: 20/25- OS: 20/25] Ocular Hx-DES OU, narrow angles PMH-DM (metformin, novolin, lantus), HTN, HLD, heart disease    CURRENT MEDICATIONS: Current Outpatient Medications (Ophthalmic Drugs)  Medication Sig  . ofloxacin (OCUFLOX) 0.3 % ophthalmic solution Place 1 drop into the left eye 4 (four) times daily.  . prednisoLONE acetate (PRED FORTE) 1 % ophthalmic suspension Place 1 drop into the left eye 4 (four) times daily.  Marland Kitchen ketorolac (ACULAR) 0.4 % SOLN Place 1 drop into the left eye 4 (four) times daily.   No current facility-administered medications for this visit.  (Ophthalmic Drugs)   Current Outpatient Medications (Other)  Medication Sig  . acetaminophen  (TYLENOL) 500 MG tablet Take 1 tablet (500 mg total) by mouth every 6 (six) hours as needed.  Marland Kitchen albuterol (PROVENTIL HFA;VENTOLIN HFA) 108 (90 Base) MCG/ACT inhaler Inhale 2 puffs into the lungs every 6 (six) hours as needed for wheezing or shortness of breath.  Marland Kitchen apixaban (ELIQUIS) 5 MG TABS tablet Take 1 tablet (5 mg total) by mouth 2 (two) times daily.  Marland Kitchen atorvastatin (LIPITOR) 40 MG tablet TAKE 1 TABLET(40 MG) BY MOUTH DAILY  . baclofen (LIORESAL) 10 MG tablet TK 1 T PO BID PRN  . carvedilol (COREG) 25 MG tablet TAKE 1 TABLET(25 MG) BY MOUTH TWICE DAILY WITH A MEAL  . ENTRESTO 97-103 MG TAKE 1 TABLET BY MOUTH TWICE DAILY  . furosemide (LASIX) 40 MG tablet Take 1 tablet (40 mg total) by mouth 2 (two) times daily.  Marland Kitchen gabapentin (NEURONTIN) 100 MG capsule TK 1 C PO TID  . glucose blood (FREESTYLE TEST STRIPS) test strip Use as instructed  . glucose monitoring kit (FREESTYLE) monitoring kit 1 each by Does not apply route 4 (four) times daily - after meals and at bedtime. 1 month Diabetic Testing Supplies for QAC-QHS accuchecks.  Marland Kitchen HUMALOG KWIKPEN 100 UNIT/ML KwikPen INJECT 10 UNITS INTO THE SKIN WITH EACH MEAL  . insulin aspart (NOVOLOG FLEXPEN) 100 UNIT/ML FlexPen 0-20 Units, Subcutaneous, 3 times daily with meals CBG < 70: implement hypoglycemia protocol CBG 70 - 120:  0 units CBG 121 - 150: 3 units CBG 151 - 200: 4 units CBG 201 - 250: 7 units CBG 251 - 300: 11 units CBG 301 - 350: 15 units CBG 351 - 400: 20 units CBG > 400: call MD  . Insulin Glargine (LANTUS SOLOSTAR) 100 UNIT/ML Solostar Pen Inject 50 Units into the skin daily at 10 pm.  . Insulin Pen Needle 32G X 8 MM MISC Use as directed  . isosorbide-hydrALAZINE (BIDIL) 20-37.5 MG tablet Take 2 tablets by mouth 3 (three) times daily.  . Lancets (FREESTYLE) lancets Use as instructed  . metFORMIN (GLUCOPHAGE) 1000 MG tablet TAKE 1 TABLET(1000 MG) BY MOUTH TWICE DAILY WITH A MEAL  . naproxen sodium (ALEVE) 220 MG tablet Take 220 mg by  mouth daily as needed (for pain or headache).   . Oxycodone HCl 10 MG TABS Take 10 mg by mouth 4 (four) times daily as needed (for pain).  . polyethylene glycol (MIRALAX / GLYCOLAX) packet Take 17 g by mouth daily as needed for mild constipation.  . Potassium Chloride ER 20 MEQ TBCR TAKE 2 TABLETS BY MOUTH DAILY  . spironolactone (ALDACTONE) 25 MG tablet TAKE 1 TABLET(25 MG) BY MOUTH DAILY  . Tafamidis (VYNDAMAX) 61 MG CAPS Take 61 mg by mouth daily.   No current facility-administered medications for this visit.  (Other)      REVIEW OF SYSTEMS: ROS    Positive for: Endocrine, Cardiovascular, Eyes, Respiratory   Negative for: Constitutional, Gastrointestinal, Neurological, Skin, Genitourinary, Musculoskeletal, HENT, Psychiatric, Allergic/Imm, Heme/Lymph   Last edited by Roselee Nova D on 09/20/2018  7:47 AM. (History)       ALLERGIES Allergies  Allergen Reactions  . Morphine And Related Nausea Only    Severe nausea  . Penicillins Other (See Comments)    Pt states she has had a pain in her leg since a penicillin injection 2 months ago (reported 08/31/17).  Has tolerated amoxicillin oral.  Has patient had a PCN reaction causing immediate rash, facial/tongue/throat swelling, SOB or lightheadedness with hypotension: No Has patient had a PCN reaction causing severe rash involving mucus membranes or skin necrosis: No Has patient had a PCN reaction that required hospitalization: No Has patient had a PCN reaction occurring within the last 10 years: Yes If a    PAST MEDICAL HISTORY Past Medical History:  Diagnosis Date  . Anemia    a. Noted on 07/2012 labs, instructed to f/u PCP.  Marland Kitchen Arthritis    "joints" (11/18/2012)  . CAD (coronary artery disease), native coronary artery    a. Nonobstructive by cath 02/2012 (done because of low EF).  . Chronic bronchitis ([place])    "~ every other year" (11/18/2012)  . Chronic combined systolic and diastolic CHF (congestive heart failure) ([place])    a.  03/05/12 echo:  LVEF 20-25%, moderate LVH , inferior and basal to mid septal akinesis, anterior moderate to severe hypokinesis and grade 2 diastolic dysfunction. b. EF 07/2012: EF still 25% (unclear medication compliance).  . Chronic lower back pain   . Headache(784.0)    "often; maybe not daily" (11/18/2012)  . High cholesterol   . History of noncompliance with medical treatment   . Hypertension   . LBBB (left bundle branch block)   . Orthopnea   . Tobacco abuse   . Type II diabetes mellitus ([place])    Past Surgical History:  Procedure Laterality Date  . BI-VENTRICULAR IMPLANTABLE CARDIOVERTER DEFIBRILLATOR N/A 11/18/2012   Procedure: BI-VENTRICULAR IMPLANTABLE CARDIOVERTER DEFIBRILLATOR  (CRT-D);  Surgeon: Evans Lance, MD;  Location: [facility] CATH LAB;  Service: Cardiovascular;  Laterality: N/A;  . BI-VENTRICULAR IMPLANTABLE CARDIOVERTER DEFIBRILLATOR  (CRT-D)  11/18/2012  . CARDIAC CATHETERIZATION  03/04/12   nonobstructive CAD, elevated LVEDP and tortuous vessels suggestive of long-standing hypertension  . COLONOSCOPY WITH PROPOFOL N/A 12/12/2016   Procedure: COLONOSCOPY WITH PROPOFOL;  Surgeon: Carol Ada, MD;  Location: [facility] ENDOSCOPY;  Service: Endoscopy;  Laterality: N/A;  . JOINT REPLACEMENT     Bilateral hip and right knee  . LEFT HEART CATH N/A 03/05/2012   Procedure: LEFT HEART CATH;  Surgeon: Larey Dresser, MD;  Location: [facility] CATH LAB;  Service: Cardiovascular;  Laterality: N/A;    FAMILY HISTORY Family History  Problem Relation Age of Onset  . Heart disease Neg Hx     SOCIAL HISTORY Social History   Tobacco Use  . Smoking status: Former Smoker    Packs/day: 1.00    Years: 40.00    Pack years: 40.00    Types: Cigarettes    Start date: 06/23/1972    Quit date: 03/26/2012    Years since quitting: 6.4  . Smokeless tobacco: Never Used  Substance Use Topics  . Alcohol use: No  . Drug use: No         OPHTHALMIC EXAM:  Base Eye Exam    Visual Acuity (Snellen -  Linear)      Right Left   Dist [place] 20/20 -1 20/20 -1       Tonometry (Tonopen, 7:59 AM)      Right Left   Pressure 14 15       Pupils      Dark Light Shape React APD   Right 2 1 Round Minimal None   Left 2 1 Round Minimal None       Visual Fields (Counting fingers)      Left Right    Full Full       Extraocular Movement      Right Left    Full, Ortho Full, Ortho       Neuro/Psych    Oriented x3: Yes   Mood/Affect: Normal       Dilation    Both eyes: 1.0% Mydriacyl, 2.5% Phenylephrine @ 7:59 AM        Slit Lamp and Fundus Exam    Slit Lamp Exam      Right Left   Lids/Lashes Dermatochalasis - upper lid Dermatochalasis - upper lid   Conjunctiva/Sclera nasal Pinguecula, Melanosis nasal/temporal Pinguecula, Melanosis   Cornea 1-2+ Punctate epithelial erosions, Well healed  IT cataract wounds 2+ Punctate epithelial erosions   Anterior Chamber Deep and quiet deep, 0.5+ cell/pigment   Iris Round and dilated, No NVI Round and dilated, No NVI   Lens PC IOL in good position PC IOL in good position   Vitreous Vitreous syneresis, mild cell/pigment on anterior vitreous Vitreous syneresis       Fundus Exam      Right Left   Disc +cupping, mild tilt, temporal PPA, Pink and Sharp sharp rim, +cupping, temporal PPA, mild tilt   C/D Ratio 0.6 0.8   Macula Blunted foveal reflex, 3-4+ERM, no heme Good foveal reflex, trace cystic changes   Vessels Vascular attenuation, veins dilated/tortuous, AV crossing changes, white, sclerotic arteriole / BRAO at 1200 - good laser surrounding Vascular attenuation, Tortuous, Copper wiring, AV crossing changes   Periphery Attached, fibrotic NV with mild pre-retinal heme at 1200, ?seafan, pigmented cystoid degeneration at 0600 - segmental PRP  laser changes in area of BRAO and NV - room for fill in Attached             IMAGING AND PROCEDURES  Imaging and Procedures for _0 @  OCT, Retina - OU - Both Eyes       Right Eye Quality was good.  Central Foveal Thickness: 436. Progression has been stable. Findings include abnormal foveal contour, epiretinal membrane, no IRF, no SRF, preretinal fibrosis, macular pucker (Thick ERM - no change from prior).   Left Eye Quality was good. Central Foveal Thickness: 236. Progression has been stable. Findings include normal foveal contour, no SRF, vitreomacular adhesion , intraretinal fluid (Mild cystic changes nasal fovea).   Notes *Images captured and stored on drive  Diagnosis / Impression:  OD: thick ERM with macular pucker -- stable from prior OS: NFP, no SRF, mild VMA -- Mild cystic changes nasal fovea   Clinical management:  See below  Abbreviations: NFP - Normal foveal profile. CME - cystoid macular edema. PED - pigment epithelial detachment. IRF - intraretinal fluid. SRF - subretinal fluid. EZ - ellipsoid zone. ERM - epiretinal membrane. ORA - outer retinal atrophy. ORT - outer retinal tubulation. SRHM - subretinal hyper-reflective material                 ASSESSMENT/PLAN:    ICD-10-CM   1. Proliferative retinopathy of right eye  H35.21   2. BRAO (branch retinal artery occlusion), right  H34.231   3. Epiretinal membrane (ERM) of right eye  H35.371   4. Retinal edema  H35.81 OCT, Retina - OU - Both Eyes  5. Moderate nonproliferative diabetic retinopathy of both eyes without macular edema associated with type 2 diabetes mellitus ([place])  G31.5176   6. Essential hypertension  I10   7. Hypertensive retinopathy of both eyes  H35.033   8. Pseudophakia of both eyes  Z96.1     1,2. Proliferative retinopathy and BRAO OD  - sclerotic superior arteriole with distal fibrotic NV (?sea fan) and overlying preretinal and vitreous heme  - etiology could also be DM2 vs sickle cell retinopathy -- sickle cell screen negative  - discussed findings, prognosis and treatment options  - s/p segmental PRP OD (2.17.2020) + fill-in PRP OD 7.13.20 - good laser changes in place  - repeat FA  (7.13.20) shows some persistent hemorrhagic/NV activity, although improved  - improved view post cataract surgery  - f/u 6-8 weeks, DFE/OCT, repeat FA (optos, transit OD)  3,4. Epiretinal membrane, OD  - fairly thick ERM overlying retina with pucker and distorted foveal profile  - OCT stable from prior, but  BCVA OD drastically improved to 20/20 from 20/60 post cataract surgery  - discussed finding and prognosis and treatment options in detail  - pt happy with vision OD and wishes to continue to monitor ERM for now  - f/u 6-8 weeks  5. Moderate nonproliferative diabetic retinopathy w/o DME, OS  - exam shows scattered MA OS, no NV  - FA (02.17.20) shows no NV OS  - OCT without diabetic macular edema, OU  - monitor  6,7. Hypertensive retinopathy OU  - discussed importance of tight BP control  - monitor  8. Pseudophakia OU  - s/p CE/IOL (OD: Dr. Shirleen Schirmer, Jun 10, 2018, OS: Dr. Shirleen Schirmer, September 16, 2018 )  - beautiful surgeries, doing well  - completed drop taper OD  - post op drops OS per Dr. Katy Fitch  - monitor   Ophthalmic Meds Ordered this visit:  No  orders of the defined types were placed in this encounter.      Return for f/u 6-8 weeks, PDR OD, DFE, OCT, FA (optos, transit OD).  There are no Patient Instructions on file for this visit.   Explained the diagnoses, plan, and follow up with the patient and they expressed understanding.  Patient expressed understanding of the importance of proper follow up care.   This document serves as a record of services personally performed by Gardiner Sleeper, MD, PhD. It was created on their behalf by Roselee Nova, COMT. The creation of this record is the provider's dictation and/or activities during the visit.  Electronically signed by: Roselee Nova, COMT 09/20/18 8:38 AM   Gardiner Sleeper, M.D., Ph.D. Diseases & Surgery of the Retina and Vitreous [facility]  I have reviewed the above documentation for  accuracy and completeness, and I agree with the above. Gardiner Sleeper, M.D., Ph.D. 09/20/18 8:38 AM    Abbreviations: M myopia (nearsighted); A astigmatism; H hyperopia (farsighted); P presbyopia; Mrx spectacle prescription;  CTL contact lenses; OD right eye; OS left eye; OU both eyes  XT exotropia; ET esotropia; PEK punctate epithelial keratitis; PEE punctate epithelial erosions; DES dry eye syndrome; MGD meibomian gland dysfunction; ATs artificial tears; PFAT's preservative free artificial tears; [place] nuclear sclerotic cataract; PSC posterior subcapsular cataract; ERM epi-retinal membrane; PVD posterior vitreous detachment; RD retinal detachment; DM diabetes mellitus; DR diabetic retinopathy; NPDR non-proliferative diabetic retinopathy; PDR proliferative diabetic retinopathy; CSME clinically significant macular edema; DME diabetic macular edema; dbh dot blot hemorrhages; CWS cotton wool spot; POAG primary open angle glaucoma; C/D cup-to-disc ratio; HVF humphrey visual field; GVF goldmann visual field; OCT optical coherence tomography; IOP intraocular pressure; BRVO Branch retinal vein occlusion; CRVO central retinal vein occlusion; CRAO central retinal artery occlusion; BRAO branch retinal artery occlusion; RT retinal tear; SB scleral buckle; PPV pars plana vitrectomy; VH Vitreous hemorrhage; PRP panretinal laser photocoagulation; IVK intravitreal kenalog; VMT vitreomacular traction; MH Macular hole;  NVD neovascularization of the disc; NVE neovascularization elsewhere; AREDS age related eye disease study; ARMD age related macular degeneration; POAG primary open angle glaucoma; EBMD epithelial/anterior basement membrane dystrophy; ACIOL anterior chamber intraocular lens; IOL intraocular lens; PCIOL posterior chamber intraocular lens; Phaco/IOL phacoemulsification with intraocular lens placement; [place] photorefractive keratectomy; LASIK laser assisted in situ keratomileusis; HTN hypertension; DM diabetes  mellitus; COPD chronic obstructive pulmonary disease

## 2018-09-20 ENCOUNTER — Ambulatory Visit (INDEPENDENT_AMBULATORY_CARE_PROVIDER_SITE_OTHER): Payer: Medicare Other | Admitting: Ophthalmology

## 2018-09-20 ENCOUNTER — Other Ambulatory Visit: Payer: Self-pay

## 2018-09-20 ENCOUNTER — Encounter (INDEPENDENT_AMBULATORY_CARE_PROVIDER_SITE_OTHER): Payer: Self-pay | Admitting: Ophthalmology

## 2018-09-20 DIAGNOSIS — Z961 Presence of intraocular lens: Secondary | ICD-10-CM

## 2018-09-20 DIAGNOSIS — H3581 Retinal edema: Secondary | ICD-10-CM | POA: Diagnosis not present

## 2018-09-20 DIAGNOSIS — H35371 Puckering of macula, right eye: Secondary | ICD-10-CM | POA: Diagnosis not present

## 2018-09-20 DIAGNOSIS — E113393 Type 2 diabetes mellitus with moderate nonproliferative diabetic retinopathy without macular edema, bilateral: Secondary | ICD-10-CM

## 2018-09-20 DIAGNOSIS — H35033 Hypertensive retinopathy, bilateral: Secondary | ICD-10-CM

## 2018-09-20 DIAGNOSIS — H3521 Other non-diabetic proliferative retinopathy, right eye: Secondary | ICD-10-CM

## 2018-09-20 DIAGNOSIS — I10 Essential (primary) hypertension: Secondary | ICD-10-CM

## 2018-09-20 DIAGNOSIS — H34231 Retinal artery branch occlusion, right eye: Secondary | ICD-10-CM

## 2018-09-24 ENCOUNTER — Other Ambulatory Visit (HOSPITAL_COMMUNITY): Payer: Self-pay

## 2018-09-24 NOTE — Progress Notes (Signed)
Paramedicine Encounter    Patient ID: [name], female    DOB: 1951-01-24, 68 y.o.   MRN: [id number]   Patient Care Team: Jeanella Anton, NP as PCP - General (Nurse Practitioner) Jorge Ny, LCSW as Social Worker (Licensed Clinical Social Worker)  Patient Active Problem List   Diagnosis Date Noted  . Right foot infection 10/22/2017  . Cellulitis in diabetic foot ([place]) 10/22/2017  . Hyperglycemia 10/22/2017  . Lumbar radiculopathy 09/18/2017  . Degenerative spondylolisthesis 09/18/2017  . Atrial fibrillation ([place]) 02/11/2017  . Paroxysmal A-fib ([place])   . Biventricular automatic implantable cardioverter defibrillator in situ   . Sepsis ([place]) 04/20/2016  . UTI (urinary tract infection) 04/20/2016  . Dyspnea 07/19/2015  . Interstitial lung disease ([place]) 11/20/2014  . SIRS (systemic inflammatory response syndrome) ([place]) 02/03/2014  . IDDM (insulin dependent diabetes mellitus) ([place]) 02/03/2014  . Tachycardia 06/14/2013  . Chronic systolic CHF (congestive heart failure) ([place]) 09/29/2012  . Hypoxia 03/06/2012  . Hypertensive heart disease 03/06/2012  . Nonischemic cardiomyopathy ([place]) 03/06/2012  . Tobacco abuse 03/06/2012  . Type 2 diabetes mellitus ([place]) 03/06/2012  . Hypertension 03/06/2012  . CAD (coronary artery disease), native coronary artery 03/06/2012  . Hypokalemia 03/06/2012  . Acute bronchitis 02/01/2009  . Sleep apnea 02/01/2009  . Chest pain 02/01/2009    Current Outpatient Medications:  .  acetaminophen (TYLENOL) 500 MG tablet, Take 1 tablet (500 mg total) by mouth every 6 (six) hours as needed., Disp: 30 tablet, Rfl: 0 .  albuterol (PROVENTIL HFA;VENTOLIN HFA) 108 (90 Base) MCG/ACT inhaler, Inhale 2 puffs into the lungs every 6 (six) hours as needed for wheezing or shortness of breath., Disp: 18 g, Rfl: 0 .  apixaban (ELIQUIS) 5 MG TABS tablet, Take 1 tablet (5 mg total) by mouth 2 (two) times daily., Disp: 60 tablet, Rfl: 3 .  atorvastatin (LIPITOR) 40  MG tablet, TAKE 1 TABLET(40 MG) BY MOUTH DAILY, Disp: 30 tablet, Rfl: 11 .  baclofen (LIORESAL) 10 MG tablet, TK 1 T PO BID PRN, Disp: , Rfl:  .  carvedilol (COREG) 25 MG tablet, TAKE 1 TABLET(25 MG) BY MOUTH TWICE DAILY WITH A MEAL, Disp: 180 tablet, Rfl: 3 .  ENTRESTO 97-103 MG, TAKE 1 TABLET BY MOUTH TWICE DAILY, Disp: 60 tablet, Rfl: 6 .  furosemide (LASIX) 40 MG tablet, Take 1 tablet (40 mg total) by mouth 2 (two) times daily., Disp: 180 tablet, Rfl: 3 .  gabapentin (NEURONTIN) 100 MG capsule, TK 1 C PO TID, Disp: , Rfl:  .  isosorbide-hydrALAZINE (BIDIL) 20-37.5 MG tablet, Take 2 tablets by mouth 3 (three) times daily., Disp: 180 tablet, Rfl: 6 .  ketorolac (ACULAR) 0.4 % SOLN, Place 1 drop into the left eye 4 (four) times daily., Disp: , Rfl:  .  metFORMIN (GLUCOPHAGE) 1000 MG tablet, TAKE 1 TABLET(1000 MG) BY MOUTH TWICE DAILY WITH A MEAL, Disp: 60 tablet, Rfl: 0 .  ofloxacin (OCUFLOX) 0.3 % ophthalmic solution, Place 1 drop into the left eye 4 (four) times daily., Disp: , Rfl:  .  Oxycodone HCl 10 MG TABS, Take 10 mg by mouth 4 (four) times daily as needed (for pain)., Disp: , Rfl:  .  Potassium Chloride ER 20 MEQ TBCR, TAKE 2 TABLETS BY MOUTH DAILY, Disp: 60 tablet, Rfl: 5 .  spironolactone (ALDACTONE) 25 MG tablet, TAKE 1 TABLET(25 MG) BY MOUTH DAILY, Disp: 30 tablet, Rfl: 3 .  Tafamidis (VYNDAMAX) 61 MG CAPS, Take 61 mg by mouth daily., Disp: 30 capsule,  Rfl: 11 .  glucose blood (FREESTYLE TEST STRIPS) test strip, Use as instructed, Disp: 100 each, Rfl: 0 .  glucose monitoring kit (FREESTYLE) monitoring kit, 1 each by Does not apply route 4 (four) times daily - after meals and at bedtime. 1 month Diabetic Testing Supplies for QAC-QHS accuchecks., Disp: 1 each, Rfl: 1 .  HUMALOG KWIKPEN 100 UNIT/ML KwikPen, INJECT 10 UNITS INTO THE SKIN WITH EACH MEAL, Disp: , Rfl:  .  insulin aspart (NOVOLOG FLEXPEN) 100 UNIT/ML FlexPen, 0-20 Units, Subcutaneous, 3 times daily with meals CBG < 70:  implement hypoglycemia protocol CBG 70 - 120: 0 units CBG 121 - 150: 3 units CBG 151 - 200: 4 units CBG 201 - 250: 7 units CBG 251 - 300: 11 units CBG 301 - 350: 15 units CBG 351 - 400: 20 units CBG > 400: call MD, Disp: 15 mL, Rfl: 0 .  Insulin Glargine (LANTUS SOLOSTAR) 100 UNIT/ML Solostar Pen, Inject 50 Units into the skin daily at 10 pm., Disp: 15 mL, Rfl: 0 .  Insulin Pen Needle 32G X 8 MM MISC, Use as directed, Disp: 100 each, Rfl: 0 .  Lancets (FREESTYLE) lancets, Use as instructed, Disp: 100 each, Rfl: 0 .  naproxen sodium (ALEVE) 220 MG tablet, Take 220 mg by mouth daily as needed (for pain or headache). , Disp: , Rfl:  .  polyethylene glycol (MIRALAX / GLYCOLAX) packet, Take 17 g by mouth daily as needed for mild constipation., Disp: 14 each, Rfl: 0 .  prednisoLONE acetate (PRED FORTE) 1 % ophthalmic suspension, Place 1 drop into the left eye 4 (four) times daily., Disp: , Rfl:  Allergies  Allergen Reactions  . Morphine And Related Nausea Only    Severe nausea  . Penicillins Other (See Comments)    Pt states she has had a pain in her leg since a penicillin injection 2 months ago (reported 08/31/17).  Has tolerated amoxicillin oral.  Has patient had a PCN reaction causing immediate rash, facial/tongue/throat swelling, SOB or lightheadedness with hypotension: No Has patient had a PCN reaction causing severe rash involving mucus membranes or skin necrosis: No Has patient had a PCN reaction that required hospitalization: No Has patient had a PCN reaction occurring within the last 10 years: Yes If a      Social History   Socioeconomic History  . Marital status: Single    Spouse name: Not on file  . Number of children: 4  . Years of education: 32  . Highest education level: Not on file  Occupational History  . Not on file  Social Needs  . Financial resource strain: Not hard at all  . Food insecurity    Worry: Never true    Inability: Never true  . Transportation needs     Medical: No    Non-medical: No  Tobacco Use  . Smoking status: Former Smoker    Packs/day: 1.00    Years: 40.00    Pack years: 40.00    Types: Cigarettes    Start date: 06/23/1972    Quit date: 03/26/2012    Years since quitting: 6.5  . Smokeless tobacco: Never Used  Substance and Sexual Activity  . Alcohol use: No  . Drug use: No  . Sexual activity: Yes  Lifestyle  . Physical activity    Days per week: Not on file    Minutes per session: Not on file  . Stress: Not on file  Relationships  . Social connections  Talks on phone: Not on file    Gets together: Not on file    Attends religious service: Not on file    Active member of club or organization: Not on file    Attends meetings of clubs or organizations: Not on file    Relationship status: Not on file  . Intimate partner violence    Fear of current or ex partner: Not on file    Emotionally abused: Not on file    Physically abused: Not on file    Forced sexual activity: Not on file  Other Topics Concern  . Not on file  Social History Narrative  . Not on file    Physical Exam Cardiovascular:     Rate and Rhythm: Regular rhythm. Tachycardia present.     Pulses: Normal pulses.  Pulmonary:     Effort: Pulmonary effort is normal.     Breath sounds: Normal breath sounds.  Abdominal:     General: Abdomen is flat. There is no distension.  Musculoskeletal: Normal range of motion.     Right lower leg: No edema.     Left lower leg: No edema.  Skin:    General: Skin is warm and dry.     Capillary Refill: Capillary refill takes less than 2 seconds.  Neurological:     Mental Status: She is alert and oriented to person, place, and time.  Psychiatric:        Mood and Affect: Mood normal.         Future Appointments  Date Time Provider [place]  11/08/2018  8:30 AM Bernarda Caffey, MD TRE[facility] None  12/06/2018  8:05 AM [facility] DEVICE REMOTES [facility]  12/14/2018  8:15 AM [facility] ECHO OP 2  [facility]  12/14/2018  9:20 AM Larey Dresser, MD [facility] None  03/07/2019  8:05 AM [facility] DEVICE REMOTES [facility]  06/06/2019  8:05 AM [facility] DEVICE REMOTES [facility]  09/05/2019  8:05 AM [facility] DEVICE REMOTES [facility]  12/05/2019  8:05 AM [facility] DEVICE REMOTES [facility]    BP 109/66 (BP Location: Left Arm, Patient Position: Sitting, Cuff Size: Normal)   Pulse 100   Resp 18   Wt 228 lb 8 oz (103.6 kg)   SpO2 96%   BMI 35.79 kg/m   Weight yesterday- 228 lb Last visit weight- 231 lb  [name] was seen at home today and reported feeling well. She denied chest pain, SOB, headache, dizziness, orthopnea, cough or fever since our last visit. Her heart rate was slightly elevated but she had just been working in the kitchen when I arrived so it was not concerning She stated she has been compliant with her medications over the past week and her weight has been stable. Her medications were verified and her pillboxes were refilled. I will follow up next week to add Bidil to her second pillbox.   [name], EMT 09/24/18  ACTION: Home visit completed Next visit planned for 1 week

## 2018-09-27 ENCOUNTER — Other Ambulatory Visit: Payer: Self-pay

## 2018-09-27 DIAGNOSIS — Z20822 Contact with and (suspected) exposure to covid-19: Secondary | ICD-10-CM

## 2018-09-28 LAB — NOVEL CORONAVIRUS, NAA: SARS-CoV-2, NAA: NOT DETECTED

## 2018-10-05 ENCOUNTER — Telehealth (HOSPITAL_COMMUNITY): Payer: Self-pay

## 2018-10-05 NOTE — Telephone Encounter (Signed)
I called [name] to schedule an appointment. Her phone did not ring and went to an automated recording that said the number is out of service. I tried multiple times and was unable to get through. I will try again later in the week.

## 2018-10-08 ENCOUNTER — Other Ambulatory Visit (HOSPITAL_COMMUNITY): Payer: Self-pay

## 2018-10-08 NOTE — Progress Notes (Signed)
Paramedicine Encounter    Patient ID: [name], female    DOB: 1950/04/21, 68 y.o.   MRN: [id number]   Patient Care Team: Jeanella Anton, NP as PCP - General (Nurse Practitioner) Jorge Ny, LCSW as Social Worker (Licensed Clinical Social Worker)  Patient Active Problem List   Diagnosis Date Noted  . Right foot infection 10/22/2017  . Cellulitis in diabetic foot ([place]) 10/22/2017  . Hyperglycemia 10/22/2017  . Lumbar radiculopathy 09/18/2017  . Degenerative spondylolisthesis 09/18/2017  . Atrial fibrillation ([place]) 02/11/2017  . Paroxysmal A-fib ([place])   . Biventricular automatic implantable cardioverter defibrillator in situ   . Sepsis ([place]) 04/20/2016  . UTI (urinary tract infection) 04/20/2016  . Dyspnea 07/19/2015  . Interstitial lung disease ([place]) 11/20/2014  . SIRS (systemic inflammatory response syndrome) ([place]) 02/03/2014  . IDDM (insulin dependent diabetes mellitus) ([place]) 02/03/2014  . Tachycardia 06/14/2013  . Chronic systolic CHF (congestive heart failure) ([place]) 09/29/2012  . Hypoxia 03/06/2012  . Hypertensive heart disease 03/06/2012  . Nonischemic cardiomyopathy ([place]) 03/06/2012  . Tobacco abuse 03/06/2012  . Type 2 diabetes mellitus ([place]) 03/06/2012  . Hypertension 03/06/2012  . CAD (coronary artery disease), native coronary artery 03/06/2012  . Hypokalemia 03/06/2012  . Acute bronchitis 02/01/2009  . Sleep apnea 02/01/2009  . Chest pain 02/01/2009    Current Outpatient Medications:  .  acetaminophen (TYLENOL) 500 MG tablet, Take 1 tablet (500 mg total) by mouth every 6 (six) hours as needed., Disp: 30 tablet, Rfl: 0 .  albuterol (PROVENTIL HFA;VENTOLIN HFA) 108 (90 Base) MCG/ACT inhaler, Inhale 2 puffs into the lungs every 6 (six) hours as needed for wheezing or shortness of breath., Disp: 18 g, Rfl: 0 .  apixaban (ELIQUIS) 5 MG TABS tablet, Take 1 tablet (5 mg total) by mouth 2 (two) times daily., Disp: 60 tablet, Rfl: 3 .  atorvastatin (LIPITOR) 40  MG tablet, TAKE 1 TABLET(40 MG) BY MOUTH DAILY, Disp: 30 tablet, Rfl: 11 .  baclofen (LIORESAL) 10 MG tablet, TK 1 T PO BID PRN, Disp: , Rfl:  .  carvedilol (COREG) 25 MG tablet, TAKE 1 TABLET(25 MG) BY MOUTH TWICE DAILY WITH A MEAL, Disp: 180 tablet, Rfl: 3 .  ENTRESTO 97-103 MG, TAKE 1 TABLET BY MOUTH TWICE DAILY, Disp: 60 tablet, Rfl: 6 .  furosemide (LASIX) 40 MG tablet, Take 1 tablet (40 mg total) by mouth 2 (two) times daily., Disp: 180 tablet, Rfl: 3 .  gabapentin (NEURONTIN) 100 MG capsule, TK 1 C PO TID, Disp: , Rfl:  .  HUMALOG KWIKPEN 100 UNIT/ML KwikPen, INJECT 10 UNITS INTO THE SKIN WITH EACH MEAL, Disp: , Rfl:  .  insulin aspart (NOVOLOG FLEXPEN) 100 UNIT/ML FlexPen, 0-20 Units, Subcutaneous, 3 times daily with meals CBG < 70: implement hypoglycemia protocol CBG 70 - 120: 0 units CBG 121 - 150: 3 units CBG 151 - 200: 4 units CBG 201 - 250: 7 units CBG 251 - 300: 11 units CBG 301 - 350: 15 units CBG 351 - 400: 20 units CBG > 400: call MD, Disp: 15 mL, Rfl: 0 .  Insulin Glargine (LANTUS SOLOSTAR) 100 UNIT/ML Solostar Pen, Inject 50 Units into the skin daily at 10 pm., Disp: 15 mL, Rfl: 0 .  isosorbide-hydrALAZINE (BIDIL) 20-37.5 MG tablet, Take 2 tablets by mouth 3 (three) times daily., Disp: 180 tablet, Rfl: 6 .  metFORMIN (GLUCOPHAGE) 1000 MG tablet, TAKE 1 TABLET(1000 MG) BY MOUTH TWICE DAILY WITH A MEAL, Disp: 60 tablet, Rfl: 0 .  naproxen  sodium (ALEVE) 220 MG tablet, Take 220 mg by mouth daily as needed (for pain or headache). , Disp: , Rfl:  .  ofloxacin (OCUFLOX) 0.3 % ophthalmic solution, Place 1 drop into the left eye 4 (four) times daily., Disp: , Rfl:  .  Potassium Chloride ER 20 MEQ TBCR, TAKE 2 TABLETS BY MOUTH DAILY, Disp: 60 tablet, Rfl: 5 .  spironolactone (ALDACTONE) 25 MG tablet, TAKE 1 TABLET(25 MG) BY MOUTH DAILY, Disp: 30 tablet, Rfl: 3 .  Tafamidis (VYNDAMAX) 61 MG CAPS, Take 61 mg by mouth daily., Disp: 30 capsule, Rfl: 11 .  glucose blood (FREESTYLE TEST STRIPS)  test strip, Use as instructed, Disp: 100 each, Rfl: 0 .  glucose monitoring kit (FREESTYLE) monitoring kit, 1 each by Does not apply route 4 (four) times daily - after meals and at bedtime. 1 month Diabetic Testing Supplies for QAC-QHS accuchecks., Disp: 1 each, Rfl: 1 .  Insulin Pen Needle 32G X 8 MM MISC, Use as directed, Disp: 100 each, Rfl: 0 .  ketorolac (ACULAR) 0.4 % SOLN, Place 1 drop into the left eye 4 (four) times daily., Disp: , Rfl:  .  Lancets (FREESTYLE) lancets, Use as instructed, Disp: 100 each, Rfl: 0 .  Oxycodone HCl 10 MG TABS, Take 10 mg by mouth 4 (four) times daily as needed (for pain)., Disp: , Rfl:  .  polyethylene glycol (MIRALAX / GLYCOLAX) packet, Take 17 g by mouth daily as needed for mild constipation., Disp: 14 each, Rfl: 0 .  prednisoLONE acetate (PRED FORTE) 1 % ophthalmic suspension, Place 1 drop into the left eye 4 (four) times daily., Disp: , Rfl:  Allergies  Allergen Reactions  . Morphine And Related Nausea Only    Severe nausea  . Penicillins Other (See Comments)    Pt states she has had a pain in her leg since a penicillin injection 2 months ago (reported 08/31/17).  Has tolerated amoxicillin oral.  Has patient had a PCN reaction causing immediate rash, facial/tongue/throat swelling, SOB or lightheadedness with hypotension: No Has patient had a PCN reaction causing severe rash involving mucus membranes or skin necrosis: No Has patient had a PCN reaction that required hospitalization: No Has patient had a PCN reaction occurring within the last 10 years: Yes If a      Social History   Socioeconomic History  . Marital status: Single    Spouse name: Not on file  . Number of children: 4  . Years of education: 40  . Highest education level: Not on file  Occupational History  . Not on file  Social Needs  . Financial resource strain: Not hard at all  . Food insecurity    Worry: Never true    Inability: Never true  . Transportation needs    Medical:  No    Non-medical: No  Tobacco Use  . Smoking status: Former Smoker    Packs/day: 1.00    Years: 40.00    Pack years: 40.00    Types: Cigarettes    Start date: 06/23/1972    Quit date: 03/26/2012    Years since quitting: 6.5  . Smokeless tobacco: Never Used  Substance and Sexual Activity  . Alcohol use: No  . Drug use: No  . Sexual activity: Yes  Lifestyle  . Physical activity    Days per week: Not on file    Minutes per session: Not on file  . Stress: Not on file  Relationships  . Social connections  Talks on phone: Not on file    Gets together: Not on file    Attends religious service: Not on file    Active member of club or organization: Not on file    Attends meetings of clubs or organizations: Not on file    Relationship status: Not on file  . Intimate partner violence    Fear of current or ex partner: Not on file    Emotionally abused: Not on file    Physically abused: Not on file    Forced sexual activity: Not on file  Other Topics Concern  . Not on file  Social History Narrative  . Not on file    Physical Exam Cardiovascular:     Rate and Rhythm: Normal rate and regular rhythm.     Pulses: Normal pulses.  Pulmonary:     Effort: Pulmonary effort is normal.     Breath sounds: Normal breath sounds.  Abdominal:     General: There is no distension.  Musculoskeletal: Normal range of motion.     Right lower leg: Edema present.     Left lower leg: No edema.  Skin:    General: Skin is warm and dry.     Capillary Refill: Capillary refill takes less than 2 seconds.  Neurological:     Mental Status: She is alert and oriented to person, place, and time.  Psychiatric:        Mood and Affect: Mood normal.         Future Appointments  Date Time Provider [place]  11/08/2018  8:30 AM Bernarda Caffey, MD TRE[facility]TRE None  12/06/2018  8:05 AM [facility] DEVICE REMOTES [facility]  12/14/2018  8:15 AM MC ECHO OP 2 [facility]  12/14/2018   9:20 AM Larey Dresser, MD [facility] None  03/07/2019  8:05 AM [facility] DEVICE REMOTES [facility]  06/06/2019  8:05 AM [facility] DEVICE REMOTES [facility]  09/05/2019  8:05 AM [facility] DEVICE REMOTES [facility]  12/05/2019  8:05 AM [facility] DEVICE REMOTES [facility]    BP (!) 171/82 (BP Location: Left Arm, Patient Position: Sitting, Cuff Size: Normal)   Pulse 92   Resp 16   Wt 231 lb (104.8 kg)   SpO2 97%   BMI 36.18 kg/m   Weight yesterday- did not weigh Last visit weight- 228 lb  Ms Vantol was seen at home today and rpeorted feeling well. She denied chest pain, SOB, headache, dizziness, orthopnea, cough or fever since our last visit. She stated she has been compliant with her medications over the past two weeks and her weight has been generally stable. Her medications were verified and her pillbox was refilled. Of note if her blood pressure however she has not taken medications prior to my arrival this morning so I suspect that she will come down following her morning medications.   [name], EMT 10/08/18  ACTION: Home visit completed Next visit planned for 2 weeks

## 2018-10-19 ENCOUNTER — Telehealth (HOSPITAL_COMMUNITY): Payer: Self-pay

## 2018-10-19 NOTE — Telephone Encounter (Signed)
I called Ms Czajkowski at the new number she gave me at our last visit. The call went directly to a voicemail box for "Verdis Frederickson" so I did not leave a message. I will drive by there later in the week as she should have enough medications to get through until Friday.

## 2018-10-22 ENCOUNTER — Other Ambulatory Visit (HOSPITAL_COMMUNITY): Payer: Self-pay

## 2018-10-22 NOTE — Progress Notes (Signed)
Paramedicine Encounter    Patient ID: [name], female    DOB: 09/03/1950, 68 y.o.   MRN: [id number]   Patient Care Team: Jeanella Anton, NP as PCP - General (Nurse Practitioner) Jorge Ny, LCSW as Social Worker (Licensed Clinical Social Worker)  Patient Active Problem List   Diagnosis Date Noted  . Right foot infection 10/22/2017  . Cellulitis in diabetic foot ([place]) 10/22/2017  . Hyperglycemia 10/22/2017  . Lumbar radiculopathy 09/18/2017  . Degenerative spondylolisthesis 09/18/2017  . Atrial fibrillation ([place]) 02/11/2017  . Paroxysmal A-fib ([place])   . Biventricular automatic implantable cardioverter defibrillator in situ   . Sepsis ([place]) 04/20/2016  . UTI (urinary tract infection) 04/20/2016  . Dyspnea 07/19/2015  . Interstitial lung disease ([place]) 11/20/2014  . SIRS (systemic inflammatory response syndrome) ([place]) 02/03/2014  . IDDM (insulin dependent diabetes mellitus) ([place]) 02/03/2014  . Tachycardia 06/14/2013  . Chronic systolic CHF (congestive heart failure) ([place]) 09/29/2012  . Hypoxia 03/06/2012  . Hypertensive heart disease 03/06/2012  . Nonischemic cardiomyopathy ([place]) 03/06/2012  . Tobacco abuse 03/06/2012  . Type 2 diabetes mellitus ([place]) 03/06/2012  . Hypertension 03/06/2012  . CAD (coronary artery disease), native coronary artery 03/06/2012  . Hypokalemia 03/06/2012  . Acute bronchitis 02/01/2009  . Sleep apnea 02/01/2009  . Chest pain 02/01/2009    Current Outpatient Medications:  .  acetaminophen (TYLENOL) 500 MG tablet, Take 1 tablet (500 mg total) by mouth every 6 (six) hours as needed., Disp: 30 tablet, Rfl: 0 .  albuterol (PROVENTIL HFA;VENTOLIN HFA) 108 (90 Base) MCG/ACT inhaler, Inhale 2 puffs into the lungs every 6 (six) hours as needed for wheezing or shortness of breath., Disp: 18 g, Rfl: 0 .  apixaban (ELIQUIS) 5 MG TABS tablet, Take 1 tablet (5 mg total) by mouth 2 (two) times daily., Disp: 60 tablet, Rfl: 3 .  atorvastatin (LIPITOR) 40  MG tablet, TAKE 1 TABLET(40 MG) BY MOUTH DAILY, Disp: 30 tablet, Rfl: 11 .  baclofen (LIORESAL) 10 MG tablet, TK 1 T PO BID PRN, Disp: , Rfl:  .  carvedilol (COREG) 25 MG tablet, TAKE 1 TABLET(25 MG) BY MOUTH TWICE DAILY WITH A MEAL, Disp: 180 tablet, Rfl: 3 .  ENTRESTO 97-103 MG, TAKE 1 TABLET BY MOUTH TWICE DAILY, Disp: 60 tablet, Rfl: 6 .  furosemide (LASIX) 40 MG tablet, Take 1 tablet (40 mg total) by mouth 2 (two) times daily., Disp: 180 tablet, Rfl: 3 .  gabapentin (NEURONTIN) 100 MG capsule, TK 1 C PO TID, Disp: , Rfl:  .  isosorbide-hydrALAZINE (BIDIL) 20-37.5 MG tablet, Take 2 tablets by mouth 3 (three) times daily., Disp: 180 tablet, Rfl: 6 .  metFORMIN (GLUCOPHAGE) 1000 MG tablet, TAKE 1 TABLET(1000 MG) BY MOUTH TWICE DAILY WITH A MEAL, Disp: 60 tablet, Rfl: 0 .  naproxen sodium (ALEVE) 220 MG tablet, Take 220 mg by mouth daily as needed (for pain or headache). , Disp: , Rfl:  .  Potassium Chloride ER 20 MEQ TBCR, TAKE 2 TABLETS BY MOUTH DAILY, Disp: 60 tablet, Rfl: 5 .  spironolactone (ALDACTONE) 25 MG tablet, TAKE 1 TABLET(25 MG) BY MOUTH DAILY, Disp: 30 tablet, Rfl: 3 .  Tafamidis (VYNDAMAX) 61 MG CAPS, Take 61 mg by mouth daily., Disp: 30 capsule, Rfl: 11 .  glucose blood (FREESTYLE TEST STRIPS) test strip, Use as instructed, Disp: 100 each, Rfl: 0 .  glucose monitoring kit (FREESTYLE) monitoring kit, 1 each by Does not apply route 4 (four) times daily - after meals and at bedtime.  1 month Diabetic Testing Supplies for QAC-QHS accuchecks., Disp: 1 each, Rfl: 1 .  HUMALOG KWIKPEN 100 UNIT/ML KwikPen, INJECT 10 UNITS INTO THE SKIN WITH EACH MEAL, Disp: , Rfl:  .  insulin aspart (NOVOLOG FLEXPEN) 100 UNIT/ML FlexPen, 0-20 Units, Subcutaneous, 3 times daily with meals CBG < 70: implement hypoglycemia protocol CBG 70 - 120: 0 units CBG 121 - 150: 3 units CBG 151 - 200: 4 units CBG 201 - 250: 7 units CBG 251 - 300: 11 units CBG 301 - 350: 15 units CBG 351 - 400: 20 units CBG > 400: call MD,  Disp: 15 mL, Rfl: 0 .  Insulin Glargine (LANTUS SOLOSTAR) 100 UNIT/ML Solostar Pen, Inject 50 Units into the skin daily at 10 pm., Disp: 15 mL, Rfl: 0 .  Insulin Pen Needle 32G X 8 MM MISC, Use as directed, Disp: 100 each, Rfl: 0 .  ketorolac (ACULAR) 0.4 % SOLN, Place 1 drop into the left eye 4 (four) times daily., Disp: , Rfl:  .  Lancets (FREESTYLE) lancets, Use as instructed, Disp: 100 each, Rfl: 0 .  ofloxacin (OCUFLOX) 0.3 % ophthalmic solution, Place 1 drop into the left eye 4 (four) times daily., Disp: , Rfl:  .  Oxycodone HCl 10 MG TABS, Take 10 mg by mouth 4 (four) times daily as needed (for pain)., Disp: , Rfl:  .  polyethylene glycol (MIRALAX / GLYCOLAX) packet, Take 17 g by mouth daily as needed for mild constipation., Disp: 14 each, Rfl: 0 .  prednisoLONE acetate (PRED FORTE) 1 % ophthalmic suspension, Place 1 drop into the left eye 4 (four) times daily., Disp: , Rfl:  Allergies  Allergen Reactions  . Morphine And Related Nausea Only    Severe nausea  . Penicillins Other (See Comments)    Pt states she has had a pain in her leg since a penicillin injection 2 months ago (reported 08/31/17).  Has tolerated amoxicillin oral.  Has patient had a PCN reaction causing immediate rash, facial/tongue/throat swelling, SOB or lightheadedness with hypotension: No Has patient had a PCN reaction causing severe rash involving mucus membranes or skin necrosis: No Has patient had a PCN reaction that required hospitalization: No Has patient had a PCN reaction occurring within the last 10 years: Yes If a      Social History   Socioeconomic History  . Marital status: Single    Spouse name: Not on file  . Number of children: 4  . Years of education: 39  . Highest education level: Not on file  Occupational History  . Not on file  Social Needs  . Financial resource strain: Not hard at all  . Food insecurity    Worry: Never true    Inability: Never true  . Transportation needs    Medical:  No    Non-medical: No  Tobacco Use  . Smoking status: Former Smoker    Packs/day: 1.00    Years: 40.00    Pack years: 40.00    Types: Cigarettes    Start date: 06/23/1972    Quit date: 03/26/2012    Years since quitting: 6.5  . Smokeless tobacco: Never Used  Substance and Sexual Activity  . Alcohol use: No  . Drug use: No  . Sexual activity: Yes  Lifestyle  . Physical activity    Days per week: Not on file    Minutes per session: Not on file  . Stress: Not on file  Relationships  . Social connections  Talks on phone: Not on file    Gets together: Not on file    Attends religious service: Not on file    Active member of club or organization: Not on file    Attends meetings of clubs or organizations: Not on file    Relationship status: Not on file  . Intimate partner violence    Fear of current or ex partner: Not on file    Emotionally abused: Not on file    Physically abused: Not on file    Forced sexual activity: Not on file  Other Topics Concern  . Not on file  Social History Narrative  . Not on file    Physical Exam Cardiovascular:     Rate and Rhythm: Normal rate and regular rhythm.     Pulses: Normal pulses.  Pulmonary:     Effort: Pulmonary effort is normal.     Breath sounds: Normal breath sounds.  Abdominal:     General: There is no distension.  Musculoskeletal: Normal range of motion.     Right lower leg: No edema.     Left lower leg: No edema.  Skin:    General: Skin is warm and dry.     Capillary Refill: Capillary refill takes less than 2 seconds.  Neurological:     Mental Status: She is alert and oriented to person, place, and time.  Psychiatric:        Mood and Affect: Mood normal.         Future Appointments  Date Time Provider [place]  11/08/2018  8:30 AM Bernarda Caffey, MD TRE-TRE None  12/06/2018  8:05 AM [facility] DEVICE REMOTES [facility]  12/14/2018  8:15 AM MC ECHO OP 2 [facility]  12/14/2018  9:20  AM Larey Dresser, MD [facility] None  03/07/2019  8:05 AM [facility] DEVICE REMOTES [facility]  06/06/2019  8:05 AM [facility] DEVICE REMOTES [facility]  09/05/2019  8:05 AM [facility] DEVICE REMOTES [facility]  12/05/2019  8:05 AM [facility] DEVICE REMOTES [facility]    BP 130/80 (BP Location: Left Arm, Patient Position: Sitting, Cuff Size: Normal)   Pulse 92   Resp 16   Wt 229 lb (103.9 kg)   SpO2 97%   BMI 35.87 kg/m   Weight yesterday- did not weight Last visit weight- 230 lb  Ms Oboyle was seen at home today and reported and reported feeling well. She denied chest pain, SOB, headache, dizziness, orthopnea, fever or cough since her last visit. She stated she has been compliant with her medications over the past two weeks and her weight has been stable. Her medications were verified and her pillbox was refilled I will need to follow up next week in order to fill her second box after she picks up her medications from the pharmacy and her Vyndamax arrives in the mail.   [name], EMT 10/22/18  ACTION: Home visit completed Next visit planned for 1 week

## 2018-10-27 ENCOUNTER — Telehealth (HOSPITAL_COMMUNITY): Payer: Self-pay

## 2018-10-27 NOTE — Telephone Encounter (Signed)
I called [name] to schedule an appointment. She stated she would be available around lunch time so we agreed to meet at 13:15.

## 2018-10-28 ENCOUNTER — Other Ambulatory Visit (HOSPITAL_COMMUNITY): Payer: Self-pay

## 2018-10-28 NOTE — Progress Notes (Signed)
Ms Breth was seen at home today to add medication to her pillbox. She had recently received medication in the mail and picked up some medicine from the pharmacy. I prepared two pillboxes for her so she would have one while I am out next week. She ran out of Bidil so I ordered a refill which will be ready tomorrow. I relayed this information to Ms Scarberry and she said she would contact me upon picking it up tomorrow.   [name], EMT 10/28/18

## 2018-11-05 ENCOUNTER — Other Ambulatory Visit: Payer: Self-pay

## 2018-11-05 DIAGNOSIS — Z20822 Contact with and (suspected) exposure to covid-19: Secondary | ICD-10-CM

## 2018-11-07 ENCOUNTER — Emergency Department (HOSPITAL_COMMUNITY)
Admission: EM | Admit: 2018-11-07 | Discharge: 2018-11-07 | Disposition: A | Discharge status: Home / Self Care | Payer: Medicare Other | Attending: Emergency Medicine | Admitting: Emergency Medicine

## 2018-11-07 ENCOUNTER — Other Ambulatory Visit: Payer: Self-pay

## 2018-11-07 ENCOUNTER — Encounter (HOSPITAL_COMMUNITY): Payer: Self-pay | Admitting: *Deleted

## 2018-11-07 DIAGNOSIS — Z87891 Personal history of nicotine dependence: Secondary | ICD-10-CM | POA: Insufficient documentation

## 2018-11-07 DIAGNOSIS — E119 Type 2 diabetes mellitus without complications: Secondary | ICD-10-CM | POA: Diagnosis not present

## 2018-11-07 DIAGNOSIS — I2510 Atherosclerotic heart disease of native coronary artery without angina pectoris: Secondary | ICD-10-CM | POA: Diagnosis not present

## 2018-11-07 DIAGNOSIS — Z79899 Other long term (current) drug therapy: Secondary | ICD-10-CM | POA: Insufficient documentation

## 2018-11-07 DIAGNOSIS — Z794 Long term (current) use of insulin: Secondary | ICD-10-CM | POA: Insufficient documentation

## 2018-11-07 DIAGNOSIS — I10 Essential (primary) hypertension: Secondary | ICD-10-CM | POA: Insufficient documentation

## 2018-11-07 DIAGNOSIS — U071 COVID-19: Secondary | ICD-10-CM

## 2018-11-07 DIAGNOSIS — I5042 Chronic combined systolic (congestive) and diastolic (congestive) heart failure: Secondary | ICD-10-CM | POA: Insufficient documentation

## 2018-11-07 DIAGNOSIS — Z7901 Long term (current) use of anticoagulants: Secondary | ICD-10-CM | POA: Diagnosis not present

## 2018-11-07 DIAGNOSIS — R05 Cough: Secondary | ICD-10-CM | POA: Diagnosis present

## 2018-11-07 LAB — NOVEL CORONAVIRUS, NAA: SARS-CoV-2, NAA: DETECTED — AB

## 2018-11-07 MED ORDER — BENZONATATE 100 MG PO CAPS: 100.0000 mg | ORAL_CAPSULE | Freq: Three times a day (TID) | ORAL | 0 refills | Status: DC

## 2018-11-07 NOTE — Discharge Instructions (Addendum)
You tested positive for the coronavirus.  You will need to quarantine for a minimum of 7 days.  You may stop quarantine after 7 days if you have been fever free for 3 days without use medication and if your symptoms are improving.  If not, you will need to quarantine until this criteria is met. Because it is a virus, you need to treat symptomatically.  Use Tylenol and ibuprofen as needed for pain or fever.  Use Tessalon Perles to help with cough.  Make sure you are staying well-hydrated water. Monitor your breathing.  If your breathing worsens, call your primary care doctor or follow-up in the ER. Return to the emergency room with any new, worsening, concerning symptoms.

## 2018-11-07 NOTE — ED Provider Notes (Signed)
[facility] EMERGENCY DEPARTMENT Provider Note   CSN: [id number] Arrival date & time: 11/07/18  1524     History   Chief Complaint Chief Complaint  Patient presents with  . Cough    COVID +    HPI [name] is a 68 y.o. female presenting for evaluation chills and sore throat.  Patient states her symptoms began 2 days ago.  She was tested 2 days ago, had not been called regarding her results when she came into the ER and found that she was covered positive on her test 2 days ago.  She does not have a thermometer at home, has not been taking her temperature.  Patient initially denies cough, but then states she has been coughing slightly more than normal.  She denies nasal congestion, chest pain, shortness of breath, nausea, vomiting, domino pain, urinary symptoms, normal bowel movements.  She denies sick contacts.  She denies contact with known COVID-19 positive person.  Patient states he does not know how she got coronavirus, however then states that she has been going about her daily tasks including grocery shopping, visiting doctors offices, as well as being out and around the community.   Additional history obtained from chart review.  Patient with a history of CAD, CHF, diabetes, hypertension.  Patient denies lung history including COPD or asthma.  Denies tobacco use.     HPI  Past Medical History:  Diagnosis Date  . Anemia    a. Noted on 07/2012 labs, instructed to f/u PCP.  Marland Kitchen Arthritis    "joints" (11/18/2012)  . CAD (coronary artery disease), native coronary artery    a. Nonobstructive by cath 02/2012 (done because of low EF).  . Chronic bronchitis ([place])    "~ every other year" (11/18/2012)  . Chronic combined systolic and diastolic CHF (congestive heart failure) ([place])    a. 03/05/12 echo:  LVEF 20-25%, moderate LVH , inferior and basal to mid septal akinesis, anterior moderate to severe hypokinesis and grade 2 diastolic dysfunction. b. EF 07/2012: EF  still 25% (unclear medication compliance).  . Chronic lower back pain   . Headache(784.0)    "often; maybe not daily" (11/18/2012)  . High cholesterol   . History of noncompliance with medical treatment   . Hypertension   . LBBB (left bundle branch block)   . Orthopnea   . Tobacco abuse   . Type II diabetes mellitus [facility])     Patient Active Problem List   Diagnosis Date Noted  . Right foot infection 10/22/2017  . Cellulitis in diabetic foot ([place]) 10/22/2017  . Hyperglycemia 10/22/2017  . Lumbar radiculopathy 09/18/2017  . Degenerative spondylolisthesis 09/18/2017  . Atrial fibrillation ([place]) 02/11/2017  . Paroxysmal A-fib ([place])   . Biventricular automatic implantable cardioverter defibrillator in situ   . Sepsis ([place]) 04/20/2016  . UTI (urinary tract infection) 04/20/2016  . Dyspnea 07/19/2015  . Interstitial lung disease ([place]) 11/20/2014  . SIRS (systemic inflammatory response syndrome) ([place]) 02/03/2014  . IDDM (insulin dependent diabetes mellitus) 02/03/2014  . Tachycardia 06/14/2013  . Chronic systolic CHF (congestive heart failure) ([place]) 09/29/2012  . Hypoxia 03/06/2012  . Hypertensive heart disease 03/06/2012  . Nonischemic cardiomyopathy ([place]) 03/06/2012  . Tobacco abuse 03/06/2012  . Type 2 diabetes mellitus ([place]) 03/06/2012  . Hypertension 03/06/2012  . CAD (coronary artery disease), native coronary artery 03/06/2012  . Hypokalemia 03/06/2012  . Acute bronchitis 02/01/2009  . Sleep apnea 02/01/2009  . Chest pain 02/01/2009    Past  Surgical History:  Procedure Laterality Date  . BI-VENTRICULAR IMPLANTABLE CARDIOVERTER DEFIBRILLATOR N/A 11/18/2012   Procedure: BI-VENTRICULAR IMPLANTABLE CARDIOVERTER DEFIBRILLATOR  (CRT-D);  Surgeon: Evans Lance, MD;  Location: [facility] CATH LAB;  Service: Cardiovascular;  Laterality: N/A;  . BI-VENTRICULAR IMPLANTABLE CARDIOVERTER DEFIBRILLATOR  (CRT-D)  11/18/2012  . CARDIAC CATHETERIZATION  03/04/12   nonobstructive CAD, elevated  LVEDP and tortuous vessels suggestive of long-standing hypertension  . COLONOSCOPY WITH PROPOFOL N/A 12/12/2016   Procedure: COLONOSCOPY WITH PROPOFOL;  Surgeon: Carol Ada, MD;  Location: [facility] ENDOSCOPY;  Service: Endoscopy;  Laterality: N/A;  . JOINT REPLACEMENT     Bilateral hip and right knee  . LEFT HEART CATH N/A 03/05/2012   Procedure: LEFT HEART CATH;  Surgeon: Larey Dresser, MD;  Location: [facility] CATH LAB;  Service: Cardiovascular;  Laterality: N/A;     OB History   No obstetric history on file.      Home Medications    Prior to Admission medications   Medication Sig Start Date End Date Taking? Authorizing Provider  acetaminophen (TYLENOL) 500 MG tablet Take 1 tablet (500 mg total) by mouth every 6 (six) hours as needed. 09/15/15   Leo Grosser, MD  albuterol (PROVENTIL HFA;VENTOLIN HFA) 108 (90 Base) MCG/ACT inhaler Inhale 2 puffs into the lungs every 6 (six) hours as needed for wheezing or shortness of breath. 04/25/16   Ghimire, Henreitta Leber, MD  apixaban (ELIQUIS) 5 MG TABS tablet Take 1 tablet (5 mg total) by mouth 2 (two) times daily. 08/06/18   Larey Dresser, MD  atorvastatin (LIPITOR) 40 MG tablet TAKE 1 TABLET(40 MG) BY MOUTH DAILY 09/17/18   Larey Dresser, MD  baclofen (LIORESAL) 10 MG tablet TK 1 T PO BID PRN 02/08/18   [provider]  benzonatate (TESSALON) 100 MG capsule Take 1 capsule (100 mg total) by mouth every 8 (eight) hours. 11/07/18   Caccavale, Sophia, PA-C  carvedilol (COREG) 25 MG tablet TAKE 1 TABLET(25 MG) BY MOUTH TWICE DAILY WITH A MEAL 04/14/18   Larey Dresser, MD  ENTRESTO 97-103 MG TAKE 1 TABLET BY MOUTH TWICE DAILY 08/05/18   Bensimhon, Shaune Pascal, MD  furosemide (LASIX) 40 MG tablet Take 1 tablet (40 mg total) by mouth 2 (two) times daily. 05/06/18   Larey Dresser, MD  gabapentin (NEURONTIN) 100 MG capsule TK 1 C PO TID 03/10/18   [provider]  glucose blood (FREESTYLE TEST STRIPS) test strip Use as instructed 04/25/16   Ghimire,  Henreitta Leber, MD  glucose monitoring kit (FREESTYLE) monitoring kit 1 each by Does not apply route 4 (four) times daily - after meals and at bedtime. 1 month Diabetic Testing Supplies for QAC-QHS accuchecks. 04/25/16   Ghimire, Henreitta Leber, MD  HUMALOG KWIKPEN 100 UNIT/ML KwikPen INJECT 10 UNITS INTO THE SKIN WITH [facility] MEAL 02/12/18   [provider]  insulin aspart (NOVOLOG FLEXPEN) 100 UNIT/ML FlexPen 0-20 Units, Subcutaneous, 3 times daily with meals CBG < 70: implement hypoglycemia protocol CBG 70 - 120: 0 units CBG 121 - 150: 3 units CBG 151 - 200: 4 units CBG 201 - 250: 7 units CBG 251 - 300: 11 units CBG 301 - 350: 15 units CBG 351 - 400: 20 units CBG > 400: call MD 04/25/16   Jonetta Osgood, MD  Insulin Glargine (LANTUS SOLOSTAR) 100 UNIT/ML Solostar Pen Inject 50 Units into the skin daily at 10 pm. 10/20/17   Recardo Evangelist, PA-C  Insulin Pen Needle 32G X 8  MM MISC Use as directed 06/18/16   Arbutus Leas, NP  isosorbide-hydrALAZINE (BIDIL) 20-37.5 MG tablet Take 2 tablets by mouth 3 (three) times daily. 09/10/18   Larey Dresser, MD  ketorolac (ACULAR) 0.4 % SOLN Place 1 drop into the left eye 4 (four) times daily. 09/10/18   [provider]  Lancets (FREESTYLE) lancets Use as instructed 06/18/16   Arbutus Leas, NP  metFORMIN (GLUCOPHAGE) 1000 MG tablet TAKE 1 TABLET(1000 MG) BY MOUTH TWICE DAILY WITH A MEAL 05/30/16   Seher Friar, PA-C  naproxen sodium (ALEVE) 220 MG tablet Take 220 mg by mouth daily as needed (for pain or headache).     [provider]  ofloxacin (OCUFLOX) 0.3 % ophthalmic solution Place 1 drop into the left eye 4 (four) times daily. 09/10/18   [provider]  Oxycodone HCl 10 MG TABS Take 10 mg by mouth 4 (four) times daily as needed (for pain).    [provider]  polyethylene glycol (MIRALAX / GLYCOLAX) packet Take 17 g by mouth daily as needed for mild constipation. 10/25/17   Raiford Noble Latif, DO   Potassium Chloride ER 20 MEQ TBCR TAKE 2 TABLETS BY MOUTH DAILY 07/23/18   Larey Dresser, MD  prednisoLONE acetate (PRED FORTE) 1 % ophthalmic suspension Place 1 drop into the left eye 4 (four) times daily. 09/10/18   [provider]  spironolactone (ALDACTONE) 25 MG tablet TAKE 1 TABLET(25 MG) BY MOUTH DAILY 07/13/18   Larey Dresser, MD  Tafamidis [facility]) 61 MG CAPS Take 61 mg by mouth daily. 08/05/18   Larey Dresser, MD    Family History Family History  Problem Relation Age of Onset  . Heart disease Neg Hx     Social History Social History   Tobacco Use  . Smoking status: Former Smoker    Packs/day: 1.00    Years: 40.00    Pack years: 40.00    Types: Cigarettes    Start date: 06/23/1972    Quit date: 03/26/2012    Years since quitting: 6.6  . Smokeless tobacco: Never Used  Substance Use Topics  . Alcohol use: No  . Drug use: No     Allergies   Morphine and related and Penicillins   Review of Systems Review of Systems  Constitutional: Positive for chills.  HENT: Positive for sore throat.   Respiratory: Positive for cough.   All other systems reviewed and are negative.    Physical Exam Updated Vital Signs BP (!) 92/54   Pulse 91   Temp 98.8 F (37.1 C) (Oral)   Resp 20   Ht 5' 7" (1.702 m)   Wt 101.2 kg   SpO2 99%   BMI 34.93 kg/m   Physical Exam Vitals signs and nursing note reviewed.  Constitutional:      General: She is not in acute distress.    Appearance: She is well-developed.     Comments: Resting comfortably in the bed in no acute distress  HENT:     Head: Normocephalic and atraumatic.     Comments: OP without erythema or tonsillar swelling or exudate. Eyes:     Extraocular Movements: Extraocular movements intact.     Conjunctiva/sclera: Conjunctivae normal.     Pupils: Pupils are equal, round, and reactive to light.  Neck:     Musculoskeletal: Normal range of motion and neck supple.  Cardiovascular:     Rate and Rhythm:  Normal rate and regular rhythm.  Pulses: Normal pulses.  Pulmonary:     Effort: Pulmonary effort is normal. No respiratory distress.     Breath sounds: Normal breath sounds. No wheezing.     Comments: Speaking in full sentences.  Clear lung sounds in all fields.  No signs of respiratory distress.  No tachypnea. Abdominal:     General: There is no distension.     Palpations: Abdomen is soft. There is no mass.     Tenderness: There is no abdominal tenderness. There is no guarding or rebound.  Musculoskeletal: Normal range of motion.     Right lower leg: No edema.     Left lower leg: No edema.  Skin:    General: Skin is warm and dry.     Capillary Refill: Capillary refill takes less than 2 seconds.  Neurological:     Mental Status: She is alert and oriented to person, place, and time.      ED Treatments / Results  Labs (all labs ordered are listed, but only abnormal results are displayed) Labs Reviewed - No data to display  EKG None  Radiology No results found.  Procedures Procedures (including critical care time)  Medications Ordered in ED Medications - No data to display   Initial Impression / Assessment and Plan / ED Course  I have reviewed the triage vital signs and the nursing notes.  Pertinent labs & imaging results that were available during my care of the patient were reviewed by me and considered in my medical decision making (see chart for details).        Patient presenting for evaluation of 2-day history of chills, sore throat, and cough.  Physical exam reassuring, she appears nontoxic.  No signs of respiratory distress or failure.  Recently diagnosed with COVID, likely the cause of her symptoms.  Discussed symptomatic treatment for viral illness.  I do not believe x-ray would be beneficial today, as she is without respiratory distress and any findings on the x-ray would likely be due to viral cause.  Discussed close monitoring for respiratory decline.   Discussed importance of quarantine.  At this time, patient appears safe for discharge.  Return precautions given.  Patient states she understands and agrees to plan.   Final Clinical Impressions(s) / ED Diagnoses   Final diagnoses:  COVID-19    ED Discharge Orders         Ordered    benzonatate (TESSALON) 100 MG capsule  Every 8 hours     11/07/18 1635           Caccavale, Sophia, PA-C 11/07/18 1732    Davonna Belling, MD 11/07/18 2311

## 2018-11-07 NOTE — ED Triage Notes (Signed)
Pt reporting onset of sore throat and cough 2 days ago. Unsure of fever. [facility]. No SOB

## 2018-11-08 ENCOUNTER — Encounter (INDEPENDENT_AMBULATORY_CARE_PROVIDER_SITE_OTHER): Payer: Medicare Other | Admitting: Ophthalmology

## 2018-11-09 ENCOUNTER — Telehealth (HOSPITAL_COMMUNITY): Payer: Self-pay

## 2018-11-09 NOTE — Telephone Encounter (Signed)
I called [name] this morning after receiving a voicemail that she needed to speak with me urgently. She stated she began feeling unwell over the last few days and went to the doctor where she tested positive for COVID-19. She advised that she is feeling well at this time and does not need any assistance. I advised I would reach out next week once her quarantine period is finished. She was understanding and agreeable.

## 2018-11-16 ENCOUNTER — Other Ambulatory Visit (HOSPITAL_COMMUNITY): Payer: Self-pay

## 2018-11-16 NOTE — Progress Notes (Signed)
Paramedicine Encounter    Patient ID: [name], female    DOB: [birth year], 68 y.o.   MRN: [id number]   Patient Care Team: System, Pcp Not In as PCP - General [place], Connye Burkitt, LCSW as Social Worker (Licensed Holiday representative)  Patient Active Problem List   Diagnosis Date Noted  . Right foot infection 10/22/2017  . Cellulitis in diabetic foot ([place]) 10/22/2017  . Hyperglycemia 10/22/2017  . Lumbar radiculopathy 09/18/2017  . Degenerative spondylolisthesis 09/18/2017  . Atrial fibrillation ([place]) 02/11/2017  . Paroxysmal A-fib ([place])   . Biventricular automatic implantable cardioverter defibrillator in situ   . Sepsis ([place]) 04/20/2016  . UTI (urinary tract infection) 04/20/2016  . Dyspnea 07/19/2015  . Interstitial lung disease ([place]) 11/20/2014  . SIRS (systemic inflammatory response syndrome) ([place]) 02/03/2014  . IDDM (insulin dependent diabetes mellitus) 02/03/2014  . Tachycardia 06/14/2013  . Chronic systolic CHF (congestive heart failure) ([place]) 09/29/2012  . Hypoxia 03/06/2012  . Hypertensive heart disease 03/06/2012  . Nonischemic cardiomyopathy ([place]) 03/06/2012  . Tobacco abuse 03/06/2012  . Type 2 diabetes mellitus ([place]) 03/06/2012  . Hypertension 03/06/2012  . CAD (coronary artery disease), native coronary artery 03/06/2012  . Hypokalemia 03/06/2012  . Acute bronchitis 02/01/2009  . Sleep apnea 02/01/2009  . Chest pain 02/01/2009    Current Outpatient Medications:  .  acetaminophen (TYLENOL) 500 MG tablet, Take 1 tablet (500 mg total) by mouth every 6 (six) hours as needed., Disp: 30 tablet, Rfl: 0 .  albuterol (PROVENTIL HFA;VENTOLIN HFA) 108 (90 Base) MCG/ACT inhaler, Inhale 2 puffs into the lungs every 6 (six) hours as needed for wheezing or shortness of breath., Disp: 18 g, Rfl: 0 .  apixaban (ELIQUIS) 5 MG TABS tablet, Take 1 tablet (5 mg total) by mouth 2 (two) times daily., Disp: 60 tablet, Rfl: 3 .  atorvastatin (LIPITOR) 40 MG tablet, TAKE 1 TABLET(40  MG) BY MOUTH DAILY, Disp: 30 tablet, Rfl: 11 .  baclofen (LIORESAL) 10 MG tablet, TK 1 T PO BID PRN, Disp: , Rfl:  .  carvedilol (COREG) 25 MG tablet, TAKE 1 TABLET(25 MG) BY MOUTH TWICE DAILY WITH A MEAL, Disp: 180 tablet, Rfl: 3 .  ENTRESTO 97-103 MG, TAKE 1 TABLET BY MOUTH TWICE DAILY, Disp: 60 tablet, Rfl: 6 .  furosemide (LASIX) 40 MG tablet, Take 1 tablet (40 mg total) by mouth 2 (two) times daily., Disp: 180 tablet, Rfl: 3 .  gabapentin (NEURONTIN) 100 MG capsule, TK 1 C PO TID, Disp: , Rfl:  .  isosorbide-hydrALAZINE (BIDIL) 20-37.5 MG tablet, Take 2 tablets by mouth 3 (three) times daily., Disp: 180 tablet, Rfl: 6 .  metFORMIN (GLUCOPHAGE) 1000 MG tablet, TAKE 1 TABLET(1000 MG) BY MOUTH TWICE DAILY WITH A MEAL, Disp: 60 tablet, Rfl: 0 .  Potassium Chloride ER 20 MEQ TBCR, TAKE 2 TABLETS BY MOUTH DAILY, Disp: 60 tablet, Rfl: 5 .  spironolactone (ALDACTONE) 25 MG tablet, TAKE 1 TABLET(25 MG) BY MOUTH DAILY, Disp: 30 tablet, Rfl: 3 .  Tafamidis (VYNDAMAX) 61 MG CAPS, Take 61 mg by mouth daily., Disp: 30 capsule, Rfl: 11 .  benzonatate (TESSALON) 100 MG capsule, Take 1 capsule (100 mg total) by mouth every 8 (eight) hours. (Patient not taking: Reported on 11/16/2018), Disp: 21 capsule, Rfl: 0 .  glucose blood (FREESTYLE TEST STRIPS) test strip, Use as instructed, Disp: 100 each, Rfl: 0 .  glucose monitoring kit (FREESTYLE) monitoring kit, 1 each by Does not apply route 4 (four) times daily - after meals  and at bedtime. 1 month Diabetic Testing Supplies for QAC-QHS accuchecks., Disp: 1 each, Rfl: 1 .  HUMALOG KWIKPEN 100 UNIT/ML KwikPen, INJECT 10 UNITS INTO THE SKIN WITH EACH MEAL, Disp: , Rfl:  .  insulin aspart (NOVOLOG FLEXPEN) 100 UNIT/ML FlexPen, 0-20 Units, Subcutaneous, 3 times daily with meals CBG < 70: implement hypoglycemia protocol CBG 70 - 120: 0 units CBG 121 - 150: 3 units CBG 151 - 200: 4 units CBG 201 - 250: 7 units CBG 251 - 300: 11 units CBG 301 - 350: 15 units CBG 351 - 400: 20  units CBG > 400: call MD, Disp: 15 mL, Rfl: 0 .  Insulin Glargine (LANTUS SOLOSTAR) 100 UNIT/ML Solostar Pen, Inject 50 Units into the skin daily at 10 pm., Disp: 15 mL, Rfl: 0 .  Insulin Pen Needle 32G X 8 MM MISC, Use as directed, Disp: 100 each, Rfl: 0 .  ketorolac (ACULAR) 0.4 % SOLN, Place 1 drop into the left eye 4 (four) times daily., Disp: , Rfl:  .  Lancets (FREESTYLE) lancets, Use as instructed, Disp: 100 each, Rfl: 0 .  naproxen sodium (ALEVE) 220 MG tablet, Take 220 mg by mouth daily as needed (for pain or headache). , Disp: , Rfl:  .  ofloxacin (OCUFLOX) 0.3 % ophthalmic solution, Place 1 drop into the left eye 4 (four) times daily., Disp: , Rfl:  .  Oxycodone HCl 10 MG TABS, Take 10 mg by mouth 4 (four) times daily as needed (for pain)., Disp: , Rfl:  .  polyethylene glycol (MIRALAX / GLYCOLAX) packet, Take 17 g by mouth daily as needed for mild constipation., Disp: 14 each, Rfl: 0 .  prednisoLONE acetate (PRED FORTE) 1 % ophthalmic suspension, Place 1 drop into the left eye 4 (four) times daily., Disp: , Rfl:  Allergies  Allergen Reactions  . Morphine And Related Nausea Only    Severe nausea  . Penicillins Other (See Comments)    Pt states she has had a pain in her leg since a penicillin injection 2 months ago (reported 08/31/17).  Has tolerated amoxicillin oral.  Has patient had a PCN reaction causing immediate rash, facial/tongue/throat swelling, SOB or lightheadedness with hypotension: No Has patient had a PCN reaction causing severe rash involving mucus membranes or skin necrosis: No Has patient had a PCN reaction that required hospitalization: No Has patient had a PCN reaction occurring within the last 10 years: Yes If a      Social History   Socioeconomic History  . Marital status: Single    Spouse name: Not on file  . Number of children: 4  . Years of education: 104  . Highest education level: Not on file  Occupational History  . Not on file  Social Needs  .  Financial resource strain: Not hard at all  . Food insecurity    Worry: Never true    Inability: Never true  . Transportation needs    Medical: No    Non-medical: No  Tobacco Use  . Smoking status: Former Smoker    Packs/day: 1.00    Years: 40.00    Pack years: 40.00    Types: Cigarettes    Start date: 06/23/1972    Quit date: 03/26/2012    Years since quitting: 6.6  . Smokeless tobacco: Never Used  Substance and Sexual Activity  . Alcohol use: No  . Drug use: No  . Sexual activity: Yes  Lifestyle  . Physical activity    Days per  week: Not on file    Minutes per session: Not on file  . Stress: Not on file  Relationships  . Social Herbalist on phone: Not on file    Gets together: Not on file    Attends religious service: Not on file    Active member of club or organization: Not on file    Attends meetings of clubs or organizations: Not on file    Relationship status: Not on file  . Intimate partner violence    Fear of current or ex partner: Not on file    Emotionally abused: Not on file    Physically abused: Not on file    Forced sexual activity: Not on file  Other Topics Concern  . Not on file  Social History Narrative  . Not on file    Physical Exam      Future Appointments  Date Time Provider [place]  11/24/2018  8:00 AM Bernarda Caffey, MD [facility] None  12/06/2018  8:05 AM [facility] DEVICE REMOTES [facility]  12/14/2018  8:15 AM MC ECHO OP 2 [facility]  12/14/2018  9:20 AM Larey Dresser, MD [facility] None  03/07/2019  8:05 AM [facility] DEVICE REMOTES [facility]  06/06/2019  8:05 AM [facility] DEVICE REMOTES [facility]  09/05/2019  8:05 AM [facility] DEVICE REMOTES [facility]  12/05/2019  8:05 AM [facility] DEVICE REMOTES [facility]    BP (!) 166/82 (BP Location: Right Arm, Patient Position: Sitting, Cuff Size: Normal)   Pulse 91   Resp 16   Wt 223 lb (101.2  kg)   SpO2 98%   BMI 34.93 kg/m   Weight yesterday- did not weigh Last visit weight- 223.1 lb  [name] was seen at home today and reported feeling well. She denied chest pain, SOB, headache, dizziness, orthopnea, fever or cough since we spoke last week at which time she had been diagnosed with COVID-19. She stated she has been compliant with her medications and her wight has been stable. Her medications were verified and her pillbox was refilled. She ran out of potassium so a refill was called in and she stated she would let me know when she picks it up so I can return to add it to her pillboxes.   [name], EMT 11/16/18  ACTION: Home visit completed Next visit planned for Friday

## 2018-11-19 ENCOUNTER — Other Ambulatory Visit (HOSPITAL_COMMUNITY): Payer: Self-pay

## 2018-11-19 NOTE — Progress Notes (Signed)
[name] was seen at home today for the purpose of finishing her pillboxes. She ran out of potassium while I was filling her boxes earlier in the week and she had picked up more today. I made the addition and she did not need further assistance at this time. I will follow up in two weeks.

## 2018-11-24 ENCOUNTER — Encounter (INDEPENDENT_AMBULATORY_CARE_PROVIDER_SITE_OTHER): Payer: Medicare Other | Admitting: Ophthalmology

## 2018-11-26 ENCOUNTER — Telehealth (HOSPITAL_COMMUNITY): Payer: Self-pay | Admitting: Licensed Clinical Social Worker

## 2018-11-26 NOTE — Telephone Encounter (Signed)
CSW informed by [employer] that pt had lost her sister last week to [place].  CSW called to check in with patient.  Patient reports that she is doing ok and has lots of family that are around and supporting each other.  Pt reports no needs at this time but appreciates the call and will reach out if she needs anything.  Jorge Ny, LCSW Clinical Social Worker [facility] Desk#: [phone] Cell#: [phone]

## 2018-12-01 ENCOUNTER — Telehealth (HOSPITAL_COMMUNITY): Payer: Self-pay

## 2018-12-01 NOTE — Telephone Encounter (Signed)
I called Ms [name] to scheudle an appointment. He phone rang to voicemail but it was full so I was unable to leave a message. I will try again tomorrow.

## 2018-12-02 ENCOUNTER — Telehealth (HOSPITAL_COMMUNITY): Payer: Self-pay

## 2018-12-02 NOTE — Telephone Encounter (Signed)
I called [name] to schedule an appointment. She stated she would be available tomorrow morning so we agreed to meet at 10:00.   [name], EMT 12/02/18

## 2018-12-03 ENCOUNTER — Telehealth (HOSPITAL_COMMUNITY): Payer: Self-pay | Admitting: Cardiology

## 2018-12-03 ENCOUNTER — Other Ambulatory Visit (HOSPITAL_COMMUNITY): Payer: Self-pay

## 2018-12-03 MED ORDER — POTASSIUM CHLORIDE CRYS ER 20 MEQ PO TBCR: 20.0000 meq | EXTENDED_RELEASE_TABLET | Freq: Every day | ORAL | 3 refills | Status: DC

## 2018-12-03 MED ORDER — SPIRONOLACTONE 25 MG PO TABS: ORAL_TABLET | ORAL | 3 refills | Status: DC

## 2018-12-03 NOTE — Progress Notes (Signed)
Paramedicine Encounter    Patient ID: [name], female    DOB: 07-Dec-1950, 68 y.o.   MRN: [id number]   Patient Care Team: System, Pcp Not In as PCP - General [place]ris, Connye Burkitt, LCSW as Social Worker (Licensed Holiday representative)  Patient Active Problem List   Diagnosis Date Noted  . Right foot infection 10/22/2017  . Cellulitis in diabetic foot ([place]) 10/22/2017  . Hyperglycemia 10/22/2017  . Lumbar radiculopathy 09/18/2017  . Degenerative spondylolisthesis 09/18/2017  . Atrial fibrillation ([place]) 02/11/2017  . Paroxysmal A-fib ([place])   . Biventricular automatic implantable cardioverter defibrillator in situ   . Sepsis ([place]) 04/20/2016  . UTI (urinary tract infection) 04/20/2016  . Dyspnea 07/19/2015  . Interstitial lung disease ([place]) 11/20/2014  . SIRS (systemic inflammatory response syndrome) ([place]) 02/03/2014  . IDDM (insulin dependent diabetes mellitus) 02/03/2014  . Tachycardia 06/14/2013  . Chronic systolic CHF (congestive heart failure) ([place]) 09/29/2012  . Hypoxia 03/06/2012  . Hypertensive heart disease 03/06/2012  . Nonischemic cardiomyopathy ([place]) 03/06/2012  . Tobacco abuse 03/06/2012  . Type 2 diabetes mellitus ([place]) 03/06/2012  . Hypertension 03/06/2012  . CAD (coronary artery disease), native coronary artery 03/06/2012  . Hypokalemia 03/06/2012  . Acute bronchitis 02/01/2009  . Sleep apnea 02/01/2009  . Chest pain 02/01/2009    Current Outpatient Medications:  .  acetaminophen (TYLENOL) 500 MG tablet, Take 1 tablet (500 mg total) by mouth every 6 (six) hours as needed., Disp: 30 tablet, Rfl: 0 .  albuterol (PROVENTIL HFA;VENTOLIN HFA) 108 (90 Base) MCG/ACT inhaler, Inhale 2 puffs into the lungs every 6 (six) hours as needed for wheezing or shortness of breath., Disp: 18 g, Rfl: 0 .  apixaban (ELIQUIS) 5 MG TABS tablet, Take 1 tablet (5 mg total) by mouth 2 (two) times daily., Disp: 60 tablet, Rfl: 3 .  atorvastatin (LIPITOR) 40 MG tablet, TAKE 1 TABLET(40  MG) BY MOUTH DAILY, Disp: 30 tablet, Rfl: 11 .  carvedilol (COREG) 25 MG tablet, TAKE 1 TABLET(25 MG) BY MOUTH TWICE DAILY WITH A MEAL, Disp: 180 tablet, Rfl: 3 .  ENTRESTO 97-103 MG, TAKE 1 TABLET BY MOUTH TWICE DAILY, Disp: 60 tablet, Rfl: 6 .  furosemide (LASIX) 40 MG tablet, Take 1 tablet (40 mg total) by mouth 2 (two) times daily., Disp: 180 tablet, Rfl: 3 .  gabapentin (NEURONTIN) 100 MG capsule, TK 1 C PO TID, Disp: , Rfl:  .  isosorbide-hydrALAZINE (BIDIL) 20-37.5 MG tablet, Take 2 tablets by mouth 3 (three) times daily., Disp: 180 tablet, Rfl: 6 .  metFORMIN (GLUCOPHAGE) 1000 MG tablet, TAKE 1 TABLET(1000 MG) BY MOUTH TWICE DAILY WITH A MEAL, Disp: 60 tablet, Rfl: 0 .  Potassium Chloride ER 20 MEQ TBCR, TAKE 2 TABLETS BY MOUTH DAILY, Disp: 60 tablet, Rfl: 5 .  spironolactone (ALDACTONE) 25 MG tablet, TAKE 1 TABLET(25 MG) BY MOUTH DAILY, Disp: 30 tablet, Rfl: 3 .  Tafamidis (VYNDAMAX) 61 MG CAPS, Take 61 mg by mouth daily., Disp: 30 capsule, Rfl: 11 .  baclofen (LIORESAL) 10 MG tablet, TK 1 T PO BID PRN, Disp: , Rfl:  .  benzonatate (TESSALON) 100 MG capsule, Take 1 capsule (100 mg total) by mouth every 8 (eight) hours. (Patient not taking: Reported on 11/16/2018), Disp: 21 capsule, Rfl: 0 .  glucose blood (FREESTYLE TEST STRIPS) test strip, Use as instructed, Disp: 100 each, Rfl: 0 .  glucose monitoring kit (FREESTYLE) monitoring kit, 1 each by Does not apply route 4 (four) times daily - after meals  and at bedtime. 1 month Diabetic Testing Supplies for QAC-QHS accuchecks., Disp: 1 each, Rfl: 1 .  HUMALOG KWIKPEN 100 UNIT/ML KwikPen, INJECT 10 UNITS INTO THE SKIN WITH EACH MEAL, Disp: , Rfl:  .  insulin aspart (NOVOLOG FLEXPEN) 100 UNIT/ML FlexPen, 0-20 Units, Subcutaneous, 3 times daily with meals CBG < 70: implement hypoglycemia protocol CBG 70 - 120: 0 units CBG 121 - 150: 3 units CBG 151 - 200: 4 units CBG 201 - 250: 7 units CBG 251 - 300: 11 units CBG 301 - 350: 15 units CBG 351 - 400: 20  units CBG > 400: call MD, Disp: 15 mL, Rfl: 0 .  Insulin Glargine (LANTUS SOLOSTAR) 100 UNIT/ML Solostar Pen, Inject 50 Units into the skin daily at 10 pm., Disp: 15 mL, Rfl: 0 .  Insulin Pen Needle 32G X 8 MM MISC, Use as directed, Disp: 100 each, Rfl: 0 .  ketorolac (ACULAR) 0.4 % SOLN, Place 1 drop into the left eye 4 (four) times daily., Disp: , Rfl:  .  Lancets (FREESTYLE) lancets, Use as instructed, Disp: 100 each, Rfl: 0 .  naproxen sodium (ALEVE) 220 MG tablet, Take 220 mg by mouth daily as needed (for pain or headache). , Disp: , Rfl:  .  ofloxacin (OCUFLOX) 0.3 % ophthalmic solution, Place 1 drop into the left eye 4 (four) times daily., Disp: , Rfl:  .  Oxycodone HCl 10 MG TABS, Take 10 mg by mouth 4 (four) times daily as needed (for pain)., Disp: , Rfl:  .  polyethylene glycol (MIRALAX / GLYCOLAX) packet, Take 17 g by mouth daily as needed for mild constipation., Disp: 14 each, Rfl: 0 .  prednisoLONE acetate (PRED FORTE) 1 % ophthalmic suspension, Place 1 drop into the left eye 4 (four) times daily., Disp: , Rfl:  Allergies  Allergen Reactions  . Morphine And Related Nausea Only    Severe nausea  . Penicillins Other (See Comments)    Pt states she has had a pain in her leg since a penicillin injection 2 months ago (reported 08/31/17).  Has tolerated amoxicillin oral.  Has patient had a PCN reaction causing immediate rash, facial/tongue/throat swelling, SOB or lightheadedness with hypotension: No Has patient had a PCN reaction causing severe rash involving mucus membranes or skin necrosis: No Has patient had a PCN reaction that required hospitalization: No Has patient had a PCN reaction occurring within the last 10 years: Yes If a      Social History   Socioeconomic History  . Marital status: Single    Spouse name: Not on file  . Number of children: 4  . Years of education: 25  . Highest education level: Not on file  Occupational History  . Not on file  Social Needs  .  Financial resource strain: Not hard at all  . Food insecurity    Worry: Never true    Inability: Never true  . Transportation needs    Medical: No    Non-medical: No  Tobacco Use  . Smoking status: Former Smoker    Packs/day: 1.00    Years: 40.00    Pack years: 40.00    Types: Cigarettes    Start date: 06/23/1972    Quit date: 03/26/2012    Years since quitting: 6.6  . Smokeless tobacco: Never Used  Substance and Sexual Activity  . Alcohol use: No  . Drug use: No  . Sexual activity: Yes  Lifestyle  . Physical activity    Days per  week: Not on file    Minutes per session: Not on file  . Stress: Not on file  Relationships  . Social Herbalist on phone: Not on file    Gets together: Not on file    Attends religious service: Not on file    Active member of club or organization: Not on file    Attends meetings of clubs or organizations: Not on file    Relationship status: Not on file  . Intimate partner violence    Fear of current or ex partner: Not on file    Emotionally abused: Not on file    Physically abused: Not on file    Forced sexual activity: Not on file  Other Topics Concern  . Not on file  Social History Narrative  . Not on file    Physical Exam Cardiovascular:     Rate and Rhythm: Regular rhythm. Tachycardia present.     Pulses: Normal pulses.  Pulmonary:     Effort: Pulmonary effort is normal.     Breath sounds: Normal breath sounds.  Abdominal:     General: There is no distension.  Musculoskeletal: Normal range of motion.     Right lower leg: Edema present.     Left lower leg: Edema present.  Skin:    General: Skin is warm and dry.     Capillary Refill: Capillary refill takes less than 2 seconds.  Neurological:     Mental Status: She is alert and oriented to person, place, and time.  Psychiatric:        Mood and Affect: Mood normal.         Future Appointments  Date Time Provider [place]  12/06/2018  8:05 AM [facility]  DEVICE REMOTES [facility]  12/14/2018  8:00 AM [facility] ECHO OP 1 [facility]  12/14/2018  9:20 AM Larey Dresser, MD [facility] None  03/07/2019  8:05 AM [facility] DEVICE REMOTES [facility]  06/06/2019  8:05 AM [facility] DEVICE REMOTES [facility]  09/05/2019  8:05 AM [facility] DEVICE REMOTES [facility]  12/05/2019  8:05 AM [facility] DEVICE REMOTES [facility]    BP (!) 200/96 (BP Location: Left Arm, Patient Position: Sitting, Cuff Size: Normal)   Pulse (!) 101   Resp 16   Wt 228 lb (103.4 kg)   SpO2 95%   BMI 35.71 kg/m   Weight yesterday- did not weigh Last visit weight- 223 lb   [name] was seen at home today and reported feeling generally well. She denied chest pain, headache, dizziness, orthopnea, fever or cough since our last visit. She did have exertional dyspnea noted while she was moving about her house. She stated she has been compliant with her medications but her weight has increased and her blood pressure was significantly elevated. She had not taken any medications this morning prior to my arrival but took them while I was present. She did not have sufficient medications to fill her pillboxes so I called in refills to the pharmacy and advised her to pick them up by this afternoon then call me so I can return and finish her boxes. I contacted the HF clinic about her weight gain, edema and hypertension. I will adjust her medications accordingly upon returning this afternoon.   [name], EMT 12/03/18  ACTION: Home visit completed Next visit planned for this afternoon for medication adjustments.

## 2018-12-03 NOTE — Addendum Note (Signed)
Addended by: Kerry Dory on: 12/03/2018 02:27 PM   Modules accepted: Orders

## 2018-12-03 NOTE — Progress Notes (Signed)
I saw [name] at home for a second time today after she picked up her medications from the pharmacy. I added the necessary medications to her pillbox, including the additional furosemide ordered by Darrick Grinder, NP, due to weight gain and edema. I will follow up next week to ensure she is improving.   [name], EMT 12/03/18

## 2018-12-03 NOTE — Telephone Encounter (Signed)
Pt aware via Zack with paramedicine  Patient will keep follow up as scheduled with DM 11/16

## 2018-12-03 NOTE — Telephone Encounter (Signed)
   Please call  Instruct to increase lasix to 80 mg twice a day x 2 day then back to 40 mg twice a day.   Start 20 meq potassium daily.   Check BMET in 7 days. Needs follow up appointment.   Maleke Feria 12:08 PM

## 2018-12-03 NOTE — Telephone Encounter (Signed)
Zack called during patients home visit to report 5 lb weight gain in 2 weeks. Exertional SOB, hypertension (SBP 200), and BLE.  Patient reports compliance with medications and diet.   Last bmet 09/10/18   BUN 11 Cr 0.78 Na 140 K 3.8

## 2018-12-06 ENCOUNTER — Ambulatory Visit (INDEPENDENT_AMBULATORY_CARE_PROVIDER_SITE_OTHER): Payer: Medicare Other | Admitting: *Deleted

## 2018-12-06 DIAGNOSIS — I428 Other cardiomyopathies: Secondary | ICD-10-CM

## 2018-12-06 DIAGNOSIS — I480 Paroxysmal atrial fibrillation: Secondary | ICD-10-CM | POA: Diagnosis not present

## 2018-12-07 LAB — CUP PACEART REMOTE DEVICE CHECK
Battery Remaining Longevity: 22 mo
Battery Voltage: 2.89 V
Brady Statistic AP VP Percent: 0.13 %
Brady Statistic AP VS Percent: 0.01 %
Brady Statistic AS VP Percent: 98.55 %
Brady Statistic AS VS Percent: 1.31 %
Brady Statistic RA Percent Paced: 0.14 %
Brady Statistic RV Percent Paced: 14.94 %
Date Time Interrogation Session: 20201109051702
HighPow Impedance: 64 Ohm
Implantable Lead Implant Date: 20141023
Implantable Lead Implant Date: 20141023
Implantable Lead Implant Date: 20141023
Implantable Lead Location: 753858
Implantable Lead Location: 753859
Implantable Lead Location: 753860
Implantable Lead Model: 4298
Implantable Lead Model: 5076
Implantable Lead Model: 6935
Implantable Pulse Generator Implant Date: 20141023
Lead Channel Impedance Value: 342 Ohm
Lead Channel Impedance Value: 342 Ohm
Lead Channel Impedance Value: 361 Ohm
Lead Channel Impedance Value: 418 Ohm
Lead Channel Impedance Value: 418 Ohm
Lead Channel Impedance Value: 456 Ohm
Lead Channel Impedance Value: 475 Ohm
Lead Channel Impedance Value: 532 Ohm
Lead Channel Impedance Value: 589 Ohm
Lead Channel Impedance Value: 589 Ohm
Lead Channel Impedance Value: 608 Ohm
Lead Channel Impedance Value: 646 Ohm
Lead Channel Impedance Value: 665 Ohm
Lead Channel Pacing Threshold Amplitude: 0.5 V
Lead Channel Pacing Threshold Amplitude: 0.625 V
Lead Channel Pacing Threshold Amplitude: 1 V
Lead Channel Pacing Threshold Pulse Width: 0.4 ms
Lead Channel Pacing Threshold Pulse Width: 0.4 ms
Lead Channel Pacing Threshold Pulse Width: 0.4 ms
Lead Channel Sensing Intrinsic Amplitude: 14.125 mV
Lead Channel Sensing Intrinsic Amplitude: 14.125 mV
Lead Channel Sensing Intrinsic Amplitude: 2.75 mV
Lead Channel Sensing Intrinsic Amplitude: 2.75 mV
Lead Channel Setting Pacing Amplitude: 1.5 V
Lead Channel Setting Pacing Amplitude: 2 V
Lead Channel Setting Pacing Amplitude: 2.5 V
Lead Channel Setting Pacing Pulse Width: 0.4 ms
Lead Channel Setting Pacing Pulse Width: 0.4 ms
Lead Channel Setting Sensing Sensitivity: 0.45 mV

## 2018-12-14 ENCOUNTER — Ambulatory Visit (HOSPITAL_BASED_OUTPATIENT_CLINIC_OR_DEPARTMENT_OTHER)
Admission: RE | Admit: 2018-12-14 | Discharge: 2018-12-14 | Disposition: A | Discharge status: Home / Self Care | Payer: Medicare Other | Source: Ambulatory Visit | Attending: Cardiology | Admitting: Cardiology

## 2018-12-14 ENCOUNTER — Other Ambulatory Visit: Payer: Self-pay

## 2018-12-14 ENCOUNTER — Encounter (HOSPITAL_COMMUNITY): Payer: Self-pay | Admitting: Cardiology

## 2018-12-14 ENCOUNTER — Other Ambulatory Visit (HOSPITAL_COMMUNITY): Payer: Self-pay

## 2018-12-14 ENCOUNTER — Ambulatory Visit (HOSPITAL_COMMUNITY)
Admission: RE | Admit: 2018-12-14 | Discharge: 2018-12-14 | Disposition: A | Discharge status: Home / Self Care | Payer: Medicare Other | Source: Ambulatory Visit | Attending: Cardiology | Admitting: Cardiology

## 2018-12-14 VITALS — BP 132/63 | HR 73 | Wt 227.0 lb

## 2018-12-14 DIAGNOSIS — E1140 Type 2 diabetes mellitus with diabetic neuropathy, unspecified: Secondary | ICD-10-CM | POA: Diagnosis not present

## 2018-12-14 DIAGNOSIS — I428 Other cardiomyopathies: Secondary | ICD-10-CM | POA: Insufficient documentation

## 2018-12-14 DIAGNOSIS — Z791 Long term (current) use of non-steroidal anti-inflammatories (NSAID): Secondary | ICD-10-CM | POA: Diagnosis not present

## 2018-12-14 DIAGNOSIS — I43 Cardiomyopathy in diseases classified elsewhere: Secondary | ICD-10-CM | POA: Insufficient documentation

## 2018-12-14 DIAGNOSIS — I2510 Atherosclerotic heart disease of native coronary artery without angina pectoris: Secondary | ICD-10-CM | POA: Diagnosis not present

## 2018-12-14 DIAGNOSIS — I5022 Chronic systolic (congestive) heart failure: Secondary | ICD-10-CM | POA: Insufficient documentation

## 2018-12-14 DIAGNOSIS — Z7901 Long term (current) use of anticoagulants: Secondary | ICD-10-CM | POA: Insufficient documentation

## 2018-12-14 DIAGNOSIS — I447 Left bundle-branch block, unspecified: Secondary | ICD-10-CM | POA: Insufficient documentation

## 2018-12-14 DIAGNOSIS — Z7952 Long term (current) use of systemic steroids: Secondary | ICD-10-CM | POA: Diagnosis not present

## 2018-12-14 DIAGNOSIS — Z87891 Personal history of nicotine dependence: Secondary | ICD-10-CM | POA: Diagnosis not present

## 2018-12-14 DIAGNOSIS — Z8249 Family history of ischemic heart disease and other diseases of the circulatory system: Secondary | ICD-10-CM | POA: Insufficient documentation

## 2018-12-14 DIAGNOSIS — E785 Hyperlipidemia, unspecified: Secondary | ICD-10-CM | POA: Insufficient documentation

## 2018-12-14 DIAGNOSIS — E854 Organ-limited amyloidosis: Secondary | ICD-10-CM | POA: Diagnosis not present

## 2018-12-14 DIAGNOSIS — Z79899 Other long term (current) drug therapy: Secondary | ICD-10-CM | POA: Diagnosis not present

## 2018-12-14 DIAGNOSIS — R9431 Abnormal electrocardiogram [ECG] [EKG]: Secondary | ICD-10-CM | POA: Insufficient documentation

## 2018-12-14 DIAGNOSIS — I071 Rheumatic tricuspid insufficiency: Secondary | ICD-10-CM | POA: Diagnosis not present

## 2018-12-14 DIAGNOSIS — Z794 Long term (current) use of insulin: Secondary | ICD-10-CM | POA: Insufficient documentation

## 2018-12-14 DIAGNOSIS — I110 Hypertensive heart disease with heart failure: Secondary | ICD-10-CM | POA: Insufficient documentation

## 2018-12-14 DIAGNOSIS — I480 Paroxysmal atrial fibrillation: Secondary | ICD-10-CM | POA: Insufficient documentation

## 2018-12-14 LAB — BASIC METABOLIC PANEL
Anion gap: 12 (ref 5–15)
BUN: 20 mg/dL (ref 8–23)
CO2: 26 mmol/L (ref 22–32)
Calcium: 8.8 mg/dL — ABNORMAL LOW (ref 8.9–10.3)
Chloride: 100 mmol/L (ref 98–111)
Creatinine, Ser: 0.98 mg/dL (ref 0.44–1.00)
GFR calc Af Amer: >60 mL/min (ref >60)
GFR calc non Af Amer: >60 mL/min (ref >60)
Glucose, Bld: 220 mg/dL — ABNORMAL HIGH (ref 70–99)
Potassium: 3.2 mmol/L — ABNORMAL LOW (ref 3.5–5.1)
Sodium: 138 mmol/L (ref 135–145)

## 2018-12-14 LAB — LIPID PANEL
Cholesterol: 164 mg/dL (ref 0–200)
HDL: 44 mg/dL (ref >40)
LDL Cholesterol: 104 mg/dL — ABNORMAL HIGH (ref 0–99)
Total CHOL/HDL Ratio: 3.7 RATIO
Triglycerides: 80 mg/dL (ref <150)
VLDL: 16 mg/dL (ref 0–40)

## 2018-12-14 NOTE — Progress Notes (Signed)
Paramedicine Encounter   Patient ID: [name] , female,   DOB: 02-Jan-1951,68 y.o.,  MRN: [id number]   Ms Edelman was seen in the clinic with Dr Aundra Dubin today. Her echo showed some improvement in her EF and no changes were made to her medications. I contacted the pharmacy and ordered a refill of potassium and Bidil. Ms Delille will pick it up today and I will come out on Friday to refill her boxes.   [name], EMT 12/14/2018   ACTION: Next visit planned for Friday

## 2018-12-14 NOTE — Progress Notes (Signed)
Patient ID: [name], female   DOB: [birth year], 68 y.o.   MRN: [id number]   Advanced Heart Failure Clinic Note   PCP: Dr. Alyson Ingles Cardiology: Dr Dora Sims is a 68 y.o. female with history of nonischemic cardiomyopathy.  She was admitted with CHF exacerbation in 02/2012.  EF 20-25% on echo, LHC with nonobstructive CAD.  She was started on cardiac meds and discharged.  In 07/2012, she was admitted again with CHF exacerbation.  She had run out of Lasix.  She was taking her other heart medications as ordered, however.  She was diuresed and discharged. She had a chronic LBBB, and Medtronic CRT-D device was placed in 10/14.  She was admitted in 6/16 with hypertensive emergency and CHF exacerbation.   Also of note, she had PFTs in 9/16 showing restrictive spirometry with low lung volume and DLCO.  She was supposed to get a high resolution CT to evaluate for interstitial lung disease but never had the study.     Admitted March 2018 with urosepsis--> E Coli Bacteremia. Completed antibiotic course. EF was down from previous to 30-35%. Also had atrial fibrillation so she was loaded on amiodarone. Placed on eliquis. Discharge weight was 208 pounds.  She is now off amiodarone.   Admitted to [facility] 1/16 -> 02/13/16 with CP and dizziness. Found to be in Afib. Converted spontaneously overnight with BB and diuresis.   Echo 11/19 showed EF 35-40%.  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    Patient had COVID-19 infection in 10/20.   Echo was done today and reviewed, EF 40-45% with mild LVH and mildly decreased RV systolic function.   She returns for followup of CHF.  She is followed by paramedicine and has been compliant with all her meds.  BP is under better control. She has right knee pain that limits her walking distance, but she denies dyspnea walking on flat ground.  No chest pain.  She does note chronic 3-4 pillow orthopnea (x years).   No lightheadedness.   Medtronic device interrogation: Fluid index > threshold recently but now back down.  >99% BiV pacing.     ECG (personally reviewed): NSR, BiV paced  Labs (2/14): SPEP negative, UPEP negative, HIV negative Labs (04/14/2017): K 3.8 Creatinine 0.94  Labs (9/19): K 3.3, creatinine 0.86 Labs (1/20): K 4, creatinine 0.87 Labs (2/20): K 4.2, creatinine 1.16 Labs (8/20): K 3.8, creatinine 0.78  PMH: 1. HTN 2. Type II diabetes with neuropathy 3. Nonischemic cardiomyopathy: ? Due to HTN versus LBBB CMP.  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  Echo (2/14) with EF 20-25%.  Echo (7/14) with EF 25%, diffuse hypokinesis.  HIV, SPEP, UPEP negative.  Has LBBB. CRT-D 10/2012 (Medtronic).  Echo (4/15) with EF 45-50%, mild diffuse hypokinesis, PA systolic pressure 38 mmHg.  Echo (6/16) with EF 40-45%, mild LVH, septal and inferior hypokinesis.   - Echo (3/18): EF 30-35%. Grade 1 DD - Echo (6/18): EF 45-50%, moderate LVH, normal RV size with mildly decreased systolic function.  - Echo (11/19): EF 35-40%, moderate LVH, moderate diastolic dysfunction, normal RV size with mildly decreased systolic function, PASP 43 mmHg.  4. Chronic LBBB 5. Right TKR 6. Bilateral THR.  7. Hyperlipidemia 8. ?COPD: Has oxygen for use with exertion.  - PFTs (9/16) with FEV1 77%, FVC 76%, ratio 101%, TLC 63%, DLCO 41% => moderate restrictive deficit  9. Atrial fibrillation: Paroxysmal.  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  11. COVID-19 infection in 10/20.   SH: Prior smoker, quit 2/14.  Never drank ETOH.  No drugs. Lives with son.   FH: Mother with "heart trouble."   Review of systems complete and found to be negative unless listed in HPI.    Current Outpatient Medications  Medication Sig Dispense Refill  . acetaminophen (TYLENOL) 500 MG tablet Take 1 tablet (500 mg total) by mouth every 6 (six) hours as  needed. 30 tablet 0  . albuterol (PROVENTIL HFA;VENTOLIN HFA) 108 (90 Base) MCG/ACT inhaler Inhale 2 puffs into the lungs every 6 (six) hours as needed for wheezing or shortness of breath. 18 g 0  . apixaban (ELIQUIS) 5 MG TABS tablet Take 1 tablet (5 mg total) by mouth 2 (two) times daily. 60 tablet 3  . atorvastatin (LIPITOR) 40 MG tablet TAKE 1 TABLET(40 MG) BY MOUTH DAILY 30 tablet 11  . baclofen (LIORESAL) 10 MG tablet TK 1 T PO BID PRN    . carvedilol (COREG) 25 MG tablet TAKE 1 TABLET(25 MG) BY MOUTH TWICE DAILY WITH A MEAL 180 tablet 3  . ENTRESTO 97-103 MG TAKE 1 TABLET BY MOUTH TWICE DAILY 60 tablet 6  . furosemide (LASIX) 40 MG tablet Take 1 tablet (40 mg total) by mouth 2 (two) times daily. 180 tablet 3  . gabapentin (NEURONTIN) 100 MG capsule TK 1 C PO TID    . glucose blood (FREESTYLE TEST STRIPS) test strip Use as instructed 100 each 0  . glucose monitoring kit (FREESTYLE) monitoring kit 1 each by Does not apply route 4 (four) times daily - after meals and at bedtime. 1 month Diabetic Testing Supplies for QAC-QHS accuchecks. 1 each 1  . HUMALOG KWIKPEN 100 UNIT/ML KwikPen INJECT 10 UNITS INTO THE SKIN WITH EACH MEAL    . insulin aspart (NOVOLOG FLEXPEN) 100 UNIT/ML FlexPen 0-20 Units, Subcutaneous, 3 times daily with meals CBG < 70: implement hypoglycemia protocol CBG 70 - 120: 0 units CBG 121 - 150: 3 units CBG 151 - 200: 4 units CBG 201 - 250: 7 units CBG 251 - 300: 11 units CBG 301 - 350: 15 units CBG 351 - 400: 20 units CBG > 400: call MD 15 mL 0  . Insulin Glargine (LANTUS SOLOSTAR) 100 UNIT/ML Solostar Pen Inject 50 Units into the skin daily at 10 pm. 15 mL 0  . Insulin Pen Needle 32G X 8 MM MISC Use as directed 100 each 0  . isosorbide-hydrALAZINE (BIDIL) 20-37.5 MG tablet Take 2 tablets by mouth 3 (three) times daily. 180 tablet 6  . ketorolac (ACULAR) 0.4 % SOLN Place 1 drop into the left eye 4 (four) times daily.    . Lancets (FREESTYLE) lancets Use as instructed  100 each 0  . metFORMIN (GLUCOPHAGE-XR) 500 MG 24 hr tablet TK 2 TS PO BID    . naproxen sodium (ALEVE) 220 MG tablet Take 220 mg by mouth daily as needed (for pain or headache).     Marland Kitchen ofloxacin (OCUFLOX) 0.3 % ophthalmic solution Place 1 drop into the left eye 4 (four) times daily.    . Oxycodone HCl 10 MG TABS Take 10 mg by mouth 4 (four) times daily as needed (for pain).    . polyethylene glycol (MIRALAX / GLYCOLAX) packet Take 17 g by mouth daily as needed for mild constipation. 14 each 0  . Potassium Chloride ER 20 MEQ TBCR TAKE 2  TABLETS BY MOUTH DAILY 60 tablet 5  . potassium chloride SA (KLOR-CON) 20 MEQ tablet Take 1 tablet (20 mEq total) by mouth daily. 90 tablet 3  . prednisoLONE acetate (PRED FORTE) 1 % ophthalmic suspension Place 1 drop into the left eye 4 (four) times daily.    Marland Kitchen spironolactone (ALDACTONE) 25 MG tablet TAKE 1 TABLET(25 MG) BY MOUTH DAILY 30 tablet 3  . Tafamidis (VYNDAMAX) 61 MG CAPS Take 61 mg by mouth daily. 30 capsule 11   No current facility-administered medications for this encounter.    Vitals:   12/14/18 0908  BP: 132/63  Pulse: 73  SpO2: 98%  Weight: 103 kg (227 lb)    Wt Readings from Last 3 Encounters:  12/14/18 103 kg (227 lb)  12/03/18 103.4 kg (228 lb)  11/16/18 101.2 kg (223 lb)    Physical exam General: NAD Neck: No JVD, no thyromegaly or thyroid nodule.  Lungs: Clear to auscultation bilaterally with normal respiratory effort. CV: Nondisplaced PMI.  Heart regular S1/S2, no S3/S4, no murmur.  No peripheral edema.  No carotid bruit.  Normal pedal pulses.  Abdomen: Soft, nontender, no hepatosplenomegaly, no distention.  Skin: Intact without lesions or rashes.  Neurologic: Alert and oriented x 3.  Psych: Normal affect. Extremities: No clubbing or cyanosis.  HEENT: Normal.   Assessment/Plan: 1. Chronic systolic CHF: Nonischemic cardiomyopathy, thought to be related to HTN. S/P CRT-D (Medtronic). Echo was done today and reviewed, EF  40-45%. She is not volume overloaded by Optivol or exam.  NYHA class II.  - Continue Coreg 25 mg BID.  - Continue Entresto 97/103 mg BID - Continue Bidil 2 tabs TID - Continue spiro 25 mg daily.  - Continue Lasix 40 mg bid.  BMET today.  - Continue paramedicine.   2. Hyperlipidemia: Continue statin, check lipids today.   3. HTN: BP now controlled.  4. PAF: No prolonged episodes.    - Continue Eliquis for anticoagulation.  5. 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 Genetic testing was negative (wild type).  - Continue tafamidis.   Followup in 3-4 months.    [name], MD  12/14/2018

## 2018-12-14 NOTE — Patient Instructions (Signed)
No medication changes today!  Labs today We will only contact you if something comes back abnormal or we need to make some changes. Otherwise no news is good news!  Your physician recommends that you schedule a follow-up appointment in: 3-4 months with Dr Aundra Dubin.  At the [facility], you and your health needs are our priority. As part of our continuing mission to provide you with exceptional heart care, we have created designated Provider Care Teams. These Care Teams include your primary Cardiologist (physician) and Advanced Practice Providers (APPs- Physician Assistants and Nurse Practitioners) who all work together to provide you with the care you need, when you need it.   You may see any of the following providers on your designated Care Team at your next follow up: Marland Kitchen Dr Glori Bickers . Dr [name] . Darrick Grinder, NP . Lyda Jester, PA   Please be sure to bring in all your medications bottles to every appointment.

## 2018-12-14 NOTE — Progress Notes (Signed)
  Echocardiogram 2D Echocardiogram has been performed.  Haris Baack A Rossi Burdo 12/14/2018, 8:56 AM

## 2018-12-15 ENCOUNTER — Telehealth (HOSPITAL_COMMUNITY): Payer: Self-pay

## 2018-12-15 DIAGNOSIS — I5022 Chronic systolic (congestive) heart failure: Secondary | ICD-10-CM

## 2018-12-15 MED ORDER — POTASSIUM CHLORIDE CRYS ER 20 MEQ PO TBCR: 40.0000 meq | EXTENDED_RELEASE_TABLET | Freq: Every day | ORAL | 3 refills | Status: DC

## 2018-12-15 NOTE — Telephone Encounter (Signed)
Pt confirmed she is taking 1 potassium daily.  Advised to start taking two a day.

## 2018-12-15 NOTE — Telephone Encounter (Signed)
Spoke with patient, aware of lab results and recommendations to add an extra 20 meq daily to current dose. Verbalized understanding. However, noticed patient has two orders in epic with different doses for potassium called patient back to confirm dose. No answer. Will make another attempt.  Lab appt made to rtc for recheck bmet.

## 2018-12-15 NOTE — Telephone Encounter (Signed)
-----   Message from Larey Dresser, MD sent at 12/14/2018 11:03 PM EST ----- Increase total daily KCl by 20 mEq, BMET 10 days.

## 2018-12-17 ENCOUNTER — Other Ambulatory Visit (HOSPITAL_COMMUNITY): Payer: Self-pay

## 2018-12-17 NOTE — Progress Notes (Signed)
Ms [name] was seen at home today to put her medications in her pillbox. She stated she had already done the evening medications so she just needed me to add the morning and noon medications to her pillboxes. I made the addition and noted an entry from the clinic advising to increase K to 60 mEq daily. I made this adjustment as well. I will follow up in two weeks.   [name], EMT 12/17/18

## 2018-12-27 ENCOUNTER — Other Ambulatory Visit (HOSPITAL_COMMUNITY): Payer: Medicare Other

## 2018-12-30 ENCOUNTER — Other Ambulatory Visit (HOSPITAL_COMMUNITY): Payer: Self-pay

## 2018-12-30 NOTE — Progress Notes (Signed)
Paramedicine Encounter    Patient ID: [name], female    DOB: 1950-08-21, 68 y.o.   MRN: [id number]   Patient Care Team: System, Pcp Not In as PCP - General [place], Connye Burkitt, LCSW as Social Worker (Licensed [employer] representative)  Patient Active Problem List   Diagnosis Date Noted  . Right foot infection 10/22/2017  . Cellulitis in diabetic foot ([place]) 10/22/2017  . Hyperglycemia 10/22/2017  . Lumbar radiculopathy 09/18/2017  . Degenerative spondylolisthesis 09/18/2017  . Atrial fibrillation ([place]) 02/11/2017  . Paroxysmal A-fib ([place])   . Biventricular automatic implantable cardioverter defibrillator in situ   . Sepsis ([place]) 04/20/2016  . UTI (urinary tract infection) 04/20/2016  . Dyspnea 07/19/2015  . Interstitial lung disease ([place]) 11/20/2014  . SIRS (systemic inflammatory response syndrome) ([place]) 02/03/2014  . IDDM (insulin dependent diabetes mellitus) 02/03/2014  . Tachycardia 06/14/2013  . Chronic systolic CHF (congestive heart failure) ([place]) 09/29/2012  . Hypoxia 03/06/2012  . Hypertensive heart disease 03/06/2012  . Nonischemic cardiomyopathy ([place]) 03/06/2012  . Tobacco abuse 03/06/2012  . Type 2 diabetes mellitus ([place]) 03/06/2012  . Hypertension 03/06/2012  . CAD (coronary artery disease), native coronary artery 03/06/2012  . Hypokalemia 03/06/2012  . Acute bronchitis 02/01/2009  . Sleep apnea 02/01/2009  . Chest pain 02/01/2009    Current Outpatient Medications:  .  acetaminophen (TYLENOL) 500 MG tablet, Take 1 tablet (500 mg total) by mouth every 6 (six) hours as needed., Disp: 30 tablet, Rfl: 0 .  albuterol (PROVENTIL HFA;VENTOLIN HFA) 108 (90 Base) MCG/ACT inhaler, Inhale 2 puffs into the lungs every 6 (six) hours as needed for wheezing or shortness of breath., Disp: 18 g, Rfl: 0 .  apixaban (ELIQUIS) 5 MG TABS tablet, Take 1 tablet (5 mg total) by mouth 2 (two) times daily., Disp: 60 tablet, Rfl: 3 .  atorvastatin (LIPITOR) 40 MG tablet, TAKE 1 TABLET(40  MG) BY MOUTH DAILY, Disp: 30 tablet, Rfl: 11 .  baclofen (LIORESAL) 10 MG tablet, TK 1 T PO BID PRN, Disp: , Rfl:  .  carvedilol (COREG) 25 MG tablet, TAKE 1 TABLET(25 MG) BY MOUTH TWICE DAILY WITH A MEAL, Disp: 180 tablet, Rfl: 3 .  ENTRESTO 97-103 MG, TAKE 1 TABLET BY MOUTH TWICE DAILY, Disp: 60 tablet, Rfl: 6 .  furosemide (LASIX) 40 MG tablet, Take 1 tablet (40 mg total) by mouth 2 (two) times daily., Disp: 180 tablet, Rfl: 3 .  gabapentin (NEURONTIN) 100 MG capsule, TK 1 C PO TID, Disp: , Rfl:  .  isosorbide-hydrALAZINE (BIDIL) 20-37.5 MG tablet, Take 2 tablets by mouth 3 (three) times daily., Disp: 180 tablet, Rfl: 6 .  metFORMIN (GLUCOPHAGE-XR) 500 MG 24 hr tablet, TK 2 TS PO BID, Disp: , Rfl:  .  potassium chloride SA (KLOR-CON) 20 MEQ tablet, Take 2 tablets (40 mEq total) by mouth daily., Disp: 180 tablet, Rfl: 3 .  spironolactone (ALDACTONE) 25 MG tablet, TAKE 1 TABLET(25 MG) BY MOUTH DAILY, Disp: 30 tablet, Rfl: 3 .  Tafamidis (VYNDAMAX) 61 MG CAPS, Take 61 mg by mouth daily., Disp: 30 capsule, Rfl: 11 .  glucose blood (FREESTYLE TEST STRIPS) test strip, Use as instructed, Disp: 100 each, Rfl: 0 .  glucose monitoring kit (FREESTYLE) monitoring kit, 1 each by Does not apply route 4 (four) times daily - after meals and at bedtime. 1 month Diabetic Testing Supplies for QAC-QHS accuchecks., Disp: 1 each, Rfl: 1 .  HUMALOG KWIKPEN 100 UNIT/ML KwikPen, INJECT 10 UNITS INTO THE SKIN WITH EACH  MEAL, Disp: , Rfl:  .  insulin aspart (NOVOLOG FLEXPEN) 100 UNIT/ML FlexPen, 0-20 Units, Subcutaneous, 3 times daily with meals CBG < 70: implement hypoglycemia protocol CBG 70 - 120: 0 units CBG 121 - 150: 3 units CBG 151 - 200: 4 units CBG 201 - 250: 7 units CBG 251 - 300: 11 units CBG 301 - 350: 15 units CBG 351 - 400: 20 units CBG > 400: call MD, Disp: 15 mL, Rfl: 0 .  Insulin Glargine (LANTUS SOLOSTAR) 100 UNIT/ML Solostar Pen, Inject 50 Units into the skin daily at 10 pm., Disp: 15 mL, Rfl: 0 .   Insulin Pen Needle 32G X 8 MM MISC, Use as directed, Disp: 100 each, Rfl: 0 .  ketorolac (ACULAR) 0.4 % SOLN, Place 1 drop into the left eye 4 (four) times daily., Disp: , Rfl:  .  Lancets (FREESTYLE) lancets, Use as instructed, Disp: 100 each, Rfl: 0 .  naproxen sodium (ALEVE) 220 MG tablet, Take 220 mg by mouth daily as needed (for pain or headache). , Disp: , Rfl:  .  ofloxacin (OCUFLOX) 0.3 % ophthalmic solution, Place 1 drop into the left eye 4 (four) times daily., Disp: , Rfl:  .  Oxycodone HCl 10 MG TABS, Take 10 mg by mouth 4 (four) times daily as needed (for pain)., Disp: , Rfl:  .  polyethylene glycol (MIRALAX / GLYCOLAX) packet, Take 17 g by mouth daily as needed for mild constipation., Disp: 14 each, Rfl: 0 .  prednisoLONE acetate (PRED FORTE) 1 % ophthalmic suspension, Place 1 drop into the left eye 4 (four) times daily., Disp: , Rfl:  Allergies  Allergen Reactions  . Morphine And Related Nausea Only    Severe nausea  . Penicillins Other (See Comments)    Pt states she has had a pain in her leg since a penicillin injection 2 months ago (reported 08/31/17).  Has tolerated amoxicillin oral.  Has patient had a PCN reaction causing immediate rash, facial/tongue/throat swelling, SOB or lightheadedness with hypotension: No Has patient had a PCN reaction causing severe rash involving mucus membranes or skin necrosis: No Has patient had a PCN reaction that required hospitalization: No Has patient had a PCN reaction occurring within the last 10 years: Yes If a      Social History   Socioeconomic History  . Marital status: Single    Spouse name: Not on file  . Number of children: 4  . Years of education: 11  . Highest education level: Not on file  Occupational History  . Not on file  Social Needs  . Financial resource strain: Not hard at all  . Food insecurity    Worry: Never true    Inability: Never true  . Transportation needs    Medical: No    Non-medical: No  Tobacco Use   . Smoking status: Former Smoker    Packs/day: 1.00    Years: 40.00    Pack years: 40.00    Types: Cigarettes    Start date: 06/23/1972    Quit date: 03/26/2012    Years since quitting: 6.7  . Smokeless tobacco: Never Used  Substance and Sexual Activity  . Alcohol use: No  . Drug use: No  . Sexual activity: Yes  Lifestyle  . Physical activity    Days per week: Not on file    Minutes per session: Not on file  . Stress: Not on file  Relationships  . Social connections    Talks on phone:   Not on file    Gets together: Not on file    Attends religious service: Not on file    Active member of club or organization: Not on file    Attends meetings of clubs or organizations: Not on file    Relationship status: Not on file  . Intimate partner violence    Fear of current or ex partner: Not on file    Emotionally abused: Not on file    Physically abused: Not on file    Forced sexual activity: Not on file  Other Topics Concern  . Not on file  Social History Narrative  . Not on file    Physical Exam Cardiovascular:     Rate and Rhythm: Normal rate and regular rhythm.     Pulses: Normal pulses.  Pulmonary:     Effort: Pulmonary effort is normal.     Breath sounds: Normal breath sounds.  Musculoskeletal:     Right lower leg: No edema.     Left lower leg: No edema.  Skin:    General: Skin is warm and dry.     Capillary Refill: Capillary refill takes less than 2 seconds.  Neurological:     Mental Status: She is alert and oriented to person, place, and time.  Psychiatric:        Mood and Affect: Mood normal.         Future Appointments  Date Time Provider Department Center  03/07/2019  8:05 AM [facility] DEVICE REMOTES [facility]  03/22/2019  8:40 AM McLean, Dalton S, MD [facility] None  06/06/2019  8:05 AM [facility] DEVICE REMOTES [facility]  09/05/2019  8:05 AM [facility] DEVICE REMOTES [facility]  12/05/2019  8:05 AM [facility]  DEVICE REMOTES [facility]    BP 130/70 (BP Location: Left Arm, Patient Position: Sitting, Cuff Size: Large)   Pulse 100   Resp 16   Wt 220 lb 6.4 oz (100 kg)   SpO2 93%   BMI 34.52 kg/m   Weight yesterday- did not weigh Last visit weight- 227 lb  Ms Mcgilvery was seen at home today and reported feeling generally well. She denied chest pain, SOB, headache, dizziness, orthopnea, fever or cough since our last visit. She stated she has been compliant with her medications and her weight has been stable. Her medications were verified and her pillboxes were refilled. She ran out of lasix so I called in a refill which will be available tomorrow after 11:00. I advised Ms Failla of this and made red dots on her pillbox where the lasix needs to be added. I told her to call me if she has any concerns with adding the medications and I will walk her through it tomorrow. She was understanding and agreeable.  Zackery R Shelton, EMT 12/30/18  ACTION: Home visit completed Next visit planned for 2 weeks       

## 2018-12-31 ENCOUNTER — Other Ambulatory Visit (HOSPITAL_COMMUNITY): Payer: Medicare Other

## 2019-01-03 NOTE — Progress Notes (Signed)
Remote ICD transmission.   

## 2019-01-05 ENCOUNTER — Other Ambulatory Visit: Payer: Self-pay

## 2019-01-05 ENCOUNTER — Ambulatory Visit (HOSPITAL_COMMUNITY)
Admission: RE | Admit: 2019-01-05 | Discharge: 2019-01-05 | Disposition: A | Discharge status: Home / Self Care | Payer: Medicare Other | Source: Ambulatory Visit | Attending: Cardiology | Admitting: Cardiology

## 2019-01-05 DIAGNOSIS — I5022 Chronic systolic (congestive) heart failure: Secondary | ICD-10-CM

## 2019-01-05 LAB — BASIC METABOLIC PANEL
Anion gap: 10 (ref 5–15)
BUN: 15 mg/dL (ref 8–23)
CO2: 29 mmol/L (ref 22–32)
Calcium: 9.2 mg/dL (ref 8.9–10.3)
Chloride: 101 mmol/L (ref 98–111)
Creatinine, Ser: 0.93 mg/dL (ref 0.44–1.00)
GFR calc Af Amer: >60 mL/min (ref >60)
GFR calc non Af Amer: >60 mL/min (ref >60)
Glucose, Bld: 254 mg/dL — ABNORMAL HIGH (ref 70–99)
Potassium: 3.6 mmol/L (ref 3.5–5.1)
Sodium: 140 mmol/L (ref 135–145)

## 2019-01-12 ENCOUNTER — Telehealth (HOSPITAL_COMMUNITY): Payer: Self-pay | Admitting: Pharmacy Technician

## 2019-01-12 NOTE — Telephone Encounter (Signed)
Patient Advocate Encounter   Received notification from [facility] that prior authorization for Vyndamax is required for next year.   PA submitted on CoverMyMeds Key  [id number] Status is pending   Will continue to follow.  [name], CPhT

## 2019-01-14 ENCOUNTER — Other Ambulatory Visit (HOSPITAL_COMMUNITY): Payer: Self-pay

## 2019-01-14 MED ORDER — SPIRONOLACTONE 25 MG PO TABS: ORAL_TABLET | ORAL | 3 refills | Status: DC

## 2019-01-14 NOTE — Progress Notes (Signed)
Paramedicine Encounter    Patient ID: [name], female    DOB: 1950/05/09, 68 y.o.   MRN: [id number]   Patient Care Team: System, Pcp Not In as PCP - [facility], Connye Burkitt, LCSW as Social Worker (Licensed [employer] representative)  Patient Active Problem List   Diagnosis Date Noted  . Right foot infection 10/22/2017  . Cellulitis in diabetic foot ([place]) 10/22/2017  . Hyperglycemia 10/22/2017  . Lumbar radiculopathy 09/18/2017  . Degenerative spondylolisthesis 09/18/2017  . Atrial fibrillation ([place]) 02/11/2017  . Paroxysmal A-fib ([place])   . Biventricular automatic implantable cardioverter defibrillator in situ   . Sepsis ([place]) 04/20/2016  . UTI (urinary tract infection) 04/20/2016  . Dyspnea 07/19/2015  . Interstitial lung disease ([place]) 11/20/2014  . SIRS (systemic inflammatory response syndrome) ([place]) 02/03/2014  . IDDM (insulin dependent diabetes mellitus) 02/03/2014  . Tachycardia 06/14/2013  . Chronic systolic CHF (congestive heart failure) ([place]) 09/29/2012  . Hypoxia 03/06/2012  . Hypertensive heart disease 03/06/2012  . Nonischemic cardiomyopathy ([place]) 03/06/2012  . Tobacco abuse 03/06/2012  . Type 2 diabetes mellitus ([place]) 03/06/2012  . Hypertension 03/06/2012  . CAD (coronary artery disease), native coronary artery 03/06/2012  . Hypokalemia 03/06/2012  . Acute bronchitis 02/01/2009  . Sleep apnea 02/01/2009  . Chest pain 02/01/2009    Current Outpatient Medications:  .  acetaminophen (TYLENOL) 500 MG tablet, Take 1 tablet (500 mg total) by mouth every 6 (six) hours as needed., Disp: 30 tablet, Rfl: 0 .  albuterol (PROVENTIL HFA;VENTOLIN HFA) 108 (90 Base) MCG/ACT inhaler, Inhale 2 puffs into the lungs every 6 (six) hours as needed for wheezing or shortness of breath., Disp: 18 g, Rfl: 0 .  apixaban (ELIQUIS) 5 MG TABS tablet, Take 1 tablet (5 mg total) by mouth 2 (two) times daily., Disp: 60 tablet, Rfl: 3 .  atorvastatin (LIPITOR) 40 MG tablet, TAKE 1 TABLET(40  MG) BY MOUTH DAILY, Disp: 30 tablet, Rfl: 11 .  baclofen (LIORESAL) 10 MG tablet, TK 1 T PO BID PRN, Disp: , Rfl:  .  carvedilol (COREG) 25 MG tablet, TAKE 1 TABLET(25 MG) BY MOUTH TWICE DAILY WITH A MEAL, Disp: 180 tablet, Rfl: 3 .  ENTRESTO 97-103 MG, TAKE 1 TABLET BY MOUTH TWICE DAILY, Disp: 60 tablet, Rfl: 6 .  gabapentin (NEURONTIN) 100 MG capsule, TK 1 C PO TID, Disp: , Rfl:  .  HUMALOG KWIKPEN 100 UNIT/ML KwikPen, INJECT 10 UNITS INTO THE SKIN WITH EACH MEAL, Disp: , Rfl:  .  insulin aspart (NOVOLOG FLEXPEN) 100 UNIT/ML FlexPen, 0-20 Units, Subcutaneous, 3 times daily with meals CBG < 70: implement hypoglycemia protocol CBG 70 - 120: 0 units CBG 121 - 150: 3 units CBG 151 - 200: 4 units CBG 201 - 250: 7 units CBG 251 - 300: 11 units CBG 301 - 350: 15 units CBG 351 - 400: 20 units CBG > 400: call MD, Disp: 15 mL, Rfl: 0 .  Insulin Glargine (LANTUS SOLOSTAR) 100 UNIT/ML Solostar Pen, Inject 50 Units into the skin daily at 10 pm., Disp: 15 mL, Rfl: 0 .  isosorbide-hydrALAZINE (BIDIL) 20-37.5 MG tablet, Take 2 tablets by mouth 3 (three) times daily., Disp: 180 tablet, Rfl: 6 .  metFORMIN (GLUCOPHAGE-XR) 500 MG 24 hr tablet, TK 2 TS PO BID, Disp: , Rfl:  .  potassium chloride SA (KLOR-CON) 20 MEQ tablet, Take 2 tablets (40 mEq total) by mouth daily., Disp: 180 tablet, Rfl: 3 .  spironolactone (ALDACTONE) 25 MG tablet, TAKE 1 TABLET(25 MG)  BY MOUTH DAILY, Disp: 30 tablet, Rfl: 3 .  Tafamidis (VYNDAMAX) 61 MG CAPS, Take 61 mg by mouth daily., Disp: 30 capsule, Rfl: 11 .  furosemide (LASIX) 40 MG tablet, Take 1 tablet (40 mg total) by mouth 2 (two) times daily., Disp: 180 tablet, Rfl: 3 .  glucose blood (FREESTYLE TEST STRIPS) test strip, Use as instructed, Disp: 100 each, Rfl: 0 .  glucose monitoring kit (FREESTYLE) monitoring kit, 1 each by Does not apply route 4 (four) times daily - after meals and at bedtime. 1 month Diabetic Testing Supplies for QAC-QHS accuchecks., Disp: 1 each, Rfl: 1 .   Insulin Pen Needle 32G X 8 MM MISC, Use as directed, Disp: 100 each, Rfl: 0 .  ketorolac (ACULAR) 0.4 % SOLN, Place 1 drop into the left eye 4 (four) times daily., Disp: , Rfl:  .  Lancets (FREESTYLE) lancets, Use as instructed, Disp: 100 each, Rfl: 0 .  naproxen sodium (ALEVE) 220 MG tablet, Take 220 mg by mouth daily as needed (for pain or headache). , Disp: , Rfl:  .  ofloxacin (OCUFLOX) 0.3 % ophthalmic solution, Place 1 drop into the left eye 4 (four) times daily., Disp: , Rfl:  .  Oxycodone HCl 10 MG TABS, Take 10 mg by mouth 4 (four) times daily as needed (for pain)., Disp: , Rfl:  .  polyethylene glycol (MIRALAX / GLYCOLAX) packet, Take 17 g by mouth daily as needed for mild constipation., Disp: 14 each, Rfl: 0 .  prednisoLONE acetate (PRED FORTE) 1 % ophthalmic suspension, Place 1 drop into the left eye 4 (four) times daily., Disp: , Rfl:  Allergies  Allergen Reactions  . Morphine And Related Nausea Only    Severe nausea  . Penicillins Other (See Comments)    Pt states she has had a pain in her leg since a penicillin injection 2 months ago (reported 08/31/17).  Has tolerated amoxicillin oral.  Has patient had a PCN reaction causing immediate rash, facial/tongue/throat swelling, SOB or lightheadedness with hypotension: No Has patient had a PCN reaction causing severe rash involving mucus membranes or skin necrosis: No Has patient had a PCN reaction that required hospitalization: No Has patient had a PCN reaction occurring within the last 10 years: Yes If a      Social History   Socioeconomic History  . Marital status: Single    Spouse name: Not on file  . Number of children: 4  . Years of education: 67  . Highest education level: Not on file  Occupational History  . Not on file  Tobacco Use  . Smoking status: Former Smoker    Packs/day: 1.00    Years: 40.00    Pack years: 40.00    Types: Cigarettes    Start date: 06/23/1972    Quit date: 03/26/2012    Years since  quitting: 6.8  . Smokeless tobacco: Never Used  Substance and Sexual Activity  . Alcohol use: No  . Drug use: No  . Sexual activity: Yes  Other Topics Concern  . Not on file  Social History Narrative  . Not on file   Social Determinants of Health   Financial Resource Strain: Low Risk   . Difficulty of Paying Living Expenses: Not hard at all  Food Insecurity: No Food Insecurity  . Worried About Charity fundraiser in the Last Year: Never true  . Ran Out of Food in the Last Year: Never true  Transportation Needs: No Transportation Needs  . Lack  of Transportation (Medical): No  . Lack of Transportation (Non-Medical): No  Physical Activity:   . Days of Exercise per Week: Not on file  . Minutes of Exercise per Session: Not on file  Stress:   . Feeling of Stress : Not on file  Social Connections:   . Frequency of Communication with Friends and Family: Not on file  . Frequency of Social Gatherings with Friends and Family: Not on file  . Attends Religious Services: Not on file  . Active Member of Clubs or Organizations: Not on file  . Attends Archivist Meetings: Not on file  . Marital Status: Not on file  Intimate Partner Violence:   . Fear of Current or Ex-Partner: Not on file  . Emotionally Abused: Not on file  . Physically Abused: Not on file  . Sexually Abused: Not on file    Physical Exam Cardiovascular:     Rate and Rhythm: Normal rate and regular rhythm.     Pulses: Normal pulses.  Pulmonary:     Effort: Pulmonary effort is normal.     Breath sounds: Normal breath sounds.  Musculoskeletal:        General: Normal range of motion.     Right lower leg: No edema.     Left lower leg: No edema.  Skin:    General: Skin is warm and dry.     Capillary Refill: Capillary refill takes less than 2 seconds.  Neurological:     Mental Status: She is alert and oriented to person, place, and time.  Psychiatric:        Mood and Affect: Mood normal.          Future Appointments  Date Time Provider [place]  03/07/2019  8:05 AM [facility]  03/22/2019  8:40 AM Larey Dresser, MD [facility] None  06/06/2019  8:05 AM [facility]  09/05/2019  8:05 AM [facility]  12/05/2019  8:05 AM [facility]    BP (!) 160/94 (BP Location: Right Arm, Patient Position: Sitting, Cuff Size: Normal)   Pulse 93   Resp 16   Wt 228 lb 6.4 oz (103.6 kg)   SpO2 97%   BMI 35.77 kg/m   Weight yesterday- Did not weigh Last visit weight- 220 lb  Ms Machnik was seen a home today and rpeorted feeling generally well. She denied chest pain, SOB, headache, dizziness, orthopnea, fever or cough since ourlast visit. She stated she has been compliant with her medications over the past two weeks but her weight has increased. I believe this is due to her running out of lasix and not picking it up as directed. She was also noted to be hypertensive but stated she had not taken her afternoon medicine and she had just returned home from shopping. Her medications were verified and her pillbox was filled through Tuesday morning. All necessary refills were ordered from the pharmacy and Ms Maske advised she would pick them up. I will follow up next week.   [name], EMT 01/14/19  ACTION: Home visit completed Next visit planned for 1 week

## 2019-01-18 ENCOUNTER — Other Ambulatory Visit (HOSPITAL_COMMUNITY): Payer: Self-pay

## 2019-01-18 NOTE — Progress Notes (Signed)
Ms [name] was seen at home today and reported feeling well. The purpose of this visit was to refill her pillboxes after she ran our of several medications last week. She had missed several days worth of medications over the weekend and is exhibiting lower extremity edema. She had not picked up medication from the from the pharmacy since it was called in last week so she went to get it while I filled her other medications. The only medication she is out of is metformin which needs doctor authorization. I have requested the pharmacy call for authorization and hope to have it filled by next week. She has enough to last 10 days. I will follow up next week via phone to ensure she has been able to get it.   [name], EMT 01/18/19

## 2019-01-19 ENCOUNTER — Other Ambulatory Visit: Payer: Self-pay

## 2019-01-24 NOTE — Telephone Encounter (Signed)
Advanced Heart Failure Patient Advocate Encounter  Prior Authorization for [name] has been approved.    PA# [id number] Effective dates: 01/12/2019 through 01/27/2020  [name], CPhT

## 2019-02-02 ENCOUNTER — Other Ambulatory Visit (HOSPITAL_COMMUNITY): Payer: Self-pay

## 2019-02-02 NOTE — Progress Notes (Signed)
Paramedicine Encounter    Patient ID: [name], female    DOB: 01-19-1951, 69 y.o.   MRN: [id number]   Patient Care Team: System, Pcp Not In as PCP - [facility] [place], Connye Burkitt, LCSW as Social Worker (Licensed [employer] representative)  Patient Active Problem List   Diagnosis Date Noted  . Right foot infection 10/22/2017  . Cellulitis in diabetic foot ([place]) 10/22/2017  . Hyperglycemia 10/22/2017  . Lumbar radiculopathy 09/18/2017  . Degenerative spondylolisthesis 09/18/2017  . Atrial fibrillation ([place]) 02/11/2017  . Paroxysmal A-fib ([place])   . Biventricular automatic implantable cardioverter defibrillator in situ   . Sepsis ([place]) 04/20/2016  . UTI (urinary tract infection) 04/20/2016  . Dyspnea 07/19/2015  . Interstitial lung disease ([place]) 11/20/2014  . SIRS (systemic inflammatory response syndrome) ([place]) 02/03/2014  . IDDM (insulin dependent diabetes mellitus) 02/03/2014  . Tachycardia 06/14/2013  . Chronic systolic CHF (congestive heart failure) ([place]) 09/29/2012  . Hypoxia 03/06/2012  . Hypertensive heart disease 03/06/2012  . Nonischemic cardiomyopathy ([place]) 03/06/2012  . Tobacco abuse 03/06/2012  . Type 2 diabetes mellitus ([place]) 03/06/2012  . Hypertension 03/06/2012  . CAD (coronary artery disease), native coronary artery 03/06/2012  . Hypokalemia 03/06/2012  . Acute bronchitis 02/01/2009  . Sleep apnea 02/01/2009  . Chest pain 02/01/2009    Current Outpatient Medications:  .  acetaminophen (TYLENOL) 500 MG tablet, Take 1 tablet (500 mg total) by mouth every 6 (six) hours as needed., Disp: 30 tablet, Rfl: 0 .  albuterol (PROVENTIL HFA;VENTOLIN HFA) 108 (90 Base) MCG/ACT inhaler, Inhale 2 puffs into the lungs every 6 (six) hours as needed for wheezing or shortness of breath., Disp: 18 g, Rfl: 0 .  apixaban (ELIQUIS) 5 MG TABS tablet, Take 1 tablet (5 mg total) by mouth 2 (two) times daily., Disp: 60 tablet, Rfl: 3 .  atorvastatin (LIPITOR) 40 MG tablet, TAKE 1 TABLET(40  MG) BY MOUTH DAILY, Disp: 30 tablet, Rfl: 11 .  baclofen (LIORESAL) 10 MG tablet, TK 1 T PO BID PRN, Disp: , Rfl:  .  carvedilol (COREG) 25 MG tablet, TAKE 1 TABLET(25 MG) BY MOUTH TWICE DAILY WITH A MEAL, Disp: 180 tablet, Rfl: 3 .  ENTRESTO 97-103 MG, TAKE 1 TABLET BY MOUTH TWICE DAILY, Disp: 60 tablet, Rfl: 6 .  furosemide (LASIX) 40 MG tablet, Take 1 tablet (40 mg total) by mouth 2 (two) times daily., Disp: 180 tablet, Rfl: 3 .  gabapentin (NEURONTIN) 100 MG capsule, TK 1 C PO TID, Disp: , Rfl:  .  HUMALOG KWIKPEN 100 UNIT/ML KwikPen, INJECT 10 UNITS INTO THE SKIN WITH EACH MEAL, Disp: , Rfl:  .  insulin aspart (NOVOLOG FLEXPEN) 100 UNIT/ML FlexPen, 0-20 Units, Subcutaneous, 3 times daily with meals CBG < 70: implement hypoglycemia protocol CBG 70 - 120: 0 units CBG 121 - 150: 3 units CBG 151 - 200: 4 units CBG 201 - 250: 7 units CBG 251 - 300: 11 units CBG 301 - 350: 15 units CBG 351 - 400: 20 units CBG > 400: call MD, Disp: 15 mL, Rfl: 0 .  Insulin Glargine (LANTUS SOLOSTAR) 100 UNIT/ML Solostar Pen, Inject 50 Units into the skin daily at 10 pm., Disp: 15 mL, Rfl: 0 .  isosorbide-hydrALAZINE (BIDIL) 20-37.5 MG tablet, Take 2 tablets by mouth 3 (three) times daily., Disp: 180 tablet, Rfl: 6 .  Oxycodone HCl 10 MG TABS, Take 10 mg by mouth 4 (four) times daily as needed (for pain)., Disp: , Rfl:  .  potassium chloride  SA (KLOR-CON) 20 MEQ tablet, Take 2 tablets (40 mEq total) by mouth daily., Disp: 180 tablet, Rfl: 3 .  spironolactone (ALDACTONE) 25 MG tablet, TAKE 1 TABLET(25 MG) BY MOUTH DAILY, Disp: 30 tablet, Rfl: 3 .  Tafamidis (VYNDAMAX) 61 MG CAPS, Take 61 mg by mouth daily., Disp: 30 capsule, Rfl: 11 .  glucose blood (FREESTYLE TEST STRIPS) test strip, Use as instructed, Disp: 100 each, Rfl: 0 .  glucose monitoring kit (FREESTYLE) monitoring kit, 1 each by Does not apply route 4 (four) times daily - after meals and at bedtime. 1 month Diabetic Testing Supplies for QAC-QHS accuchecks.,  Disp: 1 each, Rfl: 1 .  Insulin Pen Needle 32G X 8 MM MISC, Use as directed, Disp: 100 each, Rfl: 0 .  ketorolac (ACULAR) 0.4 % SOLN, Place 1 drop into the left eye 4 (four) times daily., Disp: , Rfl:  .  Lancets (FREESTYLE) lancets, Use as instructed, Disp: 100 each, Rfl: 0 .  metFORMIN (GLUCOPHAGE-XR) 500 MG 24 hr tablet, TK 2 TS PO BID, Disp: , Rfl:  .  naproxen sodium (ALEVE) 220 MG tablet, Take 220 mg by mouth daily as needed (for pain or headache). , Disp: , Rfl:  .  ofloxacin (OCUFLOX) 0.3 % ophthalmic solution, Place 1 drop into the left eye 4 (four) times daily., Disp: , Rfl:  .  polyethylene glycol (MIRALAX / GLYCOLAX) packet, Take 17 g by mouth daily as needed for mild constipation., Disp: 14 each, Rfl: 0 .  prednisoLONE acetate (PRED FORTE) 1 % ophthalmic suspension, Place 1 drop into the left eye 4 (four) times daily., Disp: , Rfl:  Allergies  Allergen Reactions  . Morphine And Related Nausea Only    Severe nausea  . Penicillins Other (See Comments)    Pt states she has had a pain in her leg since a penicillin injection 2 months ago (reported 08/31/17).  Has tolerated amoxicillin oral.  Has patient had a PCN reaction causing immediate rash, facial/tongue/throat swelling, SOB or lightheadedness with hypotension: No Has patient had a PCN reaction causing severe rash involving mucus membranes or skin necrosis: No Has patient had a PCN reaction that required hospitalization: No Has patient had a PCN reaction occurring within the last 10 years: Yes If a      Social History   Socioeconomic History  . Marital status: Single    Spouse name: Not on file  . Number of children: 4  . Years of education: 25  . Highest education level: Not on file  Occupational History  . Not on file  Tobacco Use  . Smoking status: Former Smoker    Packs/day: 1.00    Years: 40.00    Pack years: 40.00    Types: Cigarettes    Start date: 06/23/1972    Quit date: 03/26/2012    Years since quitting:  6.8  . Smokeless tobacco: Never Used  Substance and Sexual Activity  . Alcohol use: No  . Drug use: No  . Sexual activity: Yes  Other Topics Concern  . Not on file  Social History Narrative  . Not on file   Social Determinants of Health   Financial Resource Strain:   . Difficulty of Paying Living Expenses: Not on file  Food Insecurity:   . Worried About Charity fundraiser in the Last Year: Not on file  . Ran Out of Food in the Last Year: Not on file  Transportation Needs:   . Lack of Transportation (Medical): Not on  file  . Lack of Transportation (Non-Medical): Not on file  Physical Activity:   . Days of Exercise per Week: Not on file  . Minutes of Exercise per Session: Not on file  Stress:   . Feeling of Stress : Not on file  Social Connections:   . Frequency of Communication with Friends and Family: Not on file  . Frequency of Social Gatherings with Friends and Family: Not on file  . Attends Religious Services: Not on file  . Active Member of Clubs or Organizations: Not on file  . Attends Archivist Meetings: Not on file  . Marital Status: Not on file  Intimate Partner Violence:   . Fear of Current or Ex-Partner: Not on file  . Emotionally Abused: Not on file  . Physically Abused: Not on file  . Sexually Abused: Not on file    Physical Exam Cardiovascular:     Rate and Rhythm: Normal rate and regular rhythm.     Pulses: Normal pulses.  Pulmonary:     Effort: Pulmonary effort is normal.     Breath sounds: Normal breath sounds.  Musculoskeletal:        General: Normal range of motion.     Right lower leg: No edema.     Left lower leg: No edema.  Skin:    General: Skin is warm and dry.     Capillary Refill: Capillary refill takes less than 2 seconds.  Neurological:     Mental Status: She is alert and oriented to person, place, and time.  Psychiatric:        Mood and Affect: Mood normal.         Future Appointments  Date Time Provider  [place] Guerra  03/07/2019  8:05 AM [facility]  03/22/2019  8:40 AM Larey Dresser, MD [facility] None  06/06/2019  8:05 AM [facility]  09/05/2019  8:05 AM [facility]  12/05/2019  8:05 AM [facility]    BP 112/60 (BP Location: Left Arm, Patient Position: Sitting, Cuff Size: Normal)   Pulse 88   Resp 16   Wt 223 lb 12.8 oz (101.5 kg)   SpO2 97%   BMI 35.05 kg/m   Weight yesterday- did not weigh Last visit weight- 228.4 lb  [name] was seen at home today and reported feeling well. She denied chest pain, SOB, headache, dizziness, orthopnea, fever or cough. She stated she has been compliant with her medications over the past two weeks and her weight has been stable. Her medications were verified and her pillbox was refilled. She is still out of metformin but she has not scheduled a follow up appointment with her PCP to get a new prescription. I will follow up in two week.s   [name], EMT 02/02/19  ACTION: Home visit completed Next visit planned for 2 weeks

## 2019-02-04 ENCOUNTER — Other Ambulatory Visit (HOSPITAL_COMMUNITY): Payer: Self-pay

## 2019-02-04 MED ORDER — VYNDAMAX 61 MG PO CAPS: 61.0000 mg | ORAL_CAPSULE | Freq: Every day | ORAL | 11 refills | Status: DC

## 2019-02-09 ENCOUNTER — Other Ambulatory Visit (HOSPITAL_COMMUNITY): Payer: Self-pay

## 2019-02-09 MED ORDER — VYNDAMAX 61 MG PO CAPS: 61.0000 mg | ORAL_CAPSULE | Freq: Every day | ORAL | 3 refills | Status: DC

## 2019-02-11 ENCOUNTER — Other Ambulatory Visit (HOSPITAL_COMMUNITY): Payer: Self-pay

## 2019-02-11 MED ORDER — APIXABAN 5 MG PO TABS: 5.0000 mg | ORAL_TABLET | Freq: Two times a day (BID) | ORAL | 3 refills | Status: DC

## 2019-02-15 ENCOUNTER — Telehealth (HOSPITAL_COMMUNITY): Payer: Self-pay

## 2019-02-15 NOTE — Telephone Encounter (Signed)
I called Ms Vilardi to schedule an appointment. She did not answer so I left a message requesting she call me back. I will follow up later in the week if I do not hear back today.  [name], EMT 02/15/19

## 2019-02-16 ENCOUNTER — Telehealth (HOSPITAL_COMMUNITY): Payer: Self-pay

## 2019-02-16 NOTE — Telephone Encounter (Signed)
I called Ms Reder to schedule an appointment. She stated she would be available to meet on Friday so we agreed to meet then at 10:00.   [name], EMT 02/16/19

## 2019-02-18 ENCOUNTER — Other Ambulatory Visit (HOSPITAL_COMMUNITY): Payer: Self-pay

## 2019-02-18 NOTE — Progress Notes (Signed)
Paramedicine Encounter    Patient ID: [name], female    DOB: 20-Jun-1950, 69 y.o.   MRN: [id number]   Patient Care Team: System, Pcp Not In as PCP - General [place], Connye Burkitt, LCSW as Social Worker (Licensed Holiday representative)  Patient Active Problem List   Diagnosis Date Noted  . Right foot infection 10/22/2017  . Cellulitis in diabetic foot ([place]) 10/22/2017  . Hyperglycemia 10/22/2017  . Lumbar radiculopathy 09/18/2017  . Degenerative spondylolisthesis 09/18/2017  . Atrial fibrillation ([place]) 02/11/2017  . Paroxysmal A-fib ([place])   . Biventricular automatic implantable cardioverter defibrillator in situ   . Sepsis ([place]) 04/20/2016  . UTI (urinary tract infection) 04/20/2016  . Dyspnea 07/19/2015  . Interstitial lung disease ([place]) 11/20/2014  . SIRS (systemic inflammatory response syndrome) ([place]) 02/03/2014  . IDDM (insulin dependent diabetes mellitus) 02/03/2014  . Tachycardia 06/14/2013  . Chronic systolic CHF (congestive heart failure) ([place]) 09/29/2012  . Hypoxia 03/06/2012  . Hypertensive heart disease 03/06/2012  . Nonischemic cardiomyopathy ([place]) 03/06/2012  . Tobacco abuse 03/06/2012  . Type 2 diabetes mellitus ([place]) 03/06/2012  . Hypertension 03/06/2012  . CAD (coronary artery disease), native coronary artery 03/06/2012  . Hypokalemia 03/06/2012  . Acute bronchitis 02/01/2009  . Sleep apnea 02/01/2009  . Chest pain 02/01/2009    Current Outpatient Medications:  .  acetaminophen (TYLENOL) 500 MG tablet, Take 1 tablet (500 mg total) by mouth every 6 (six) hours as needed., Disp: 30 tablet, Rfl: 0 .  albuterol (PROVENTIL HFA;VENTOLIN HFA) 108 (90 Base) MCG/ACT inhaler, Inhale 2 puffs into the lungs every 6 (six) hours as needed for wheezing or shortness of breath., Disp: 18 g, Rfl: 0 .  apixaban (ELIQUIS) 5 MG TABS tablet, Take 1 tablet (5 mg total) by mouth 2 (two) times daily., Disp: 60 tablet, Rfl: 3 .  atorvastatin (LIPITOR) 40 MG tablet, TAKE 1 TABLET(40  MG) BY MOUTH DAILY, Disp: 30 tablet, Rfl: 11 .  carvedilol (COREG) 25 MG tablet, TAKE 1 TABLET(25 MG) BY MOUTH TWICE DAILY WITH A MEAL, Disp: 180 tablet, Rfl: 3 .  ENTRESTO 97-103 MG, TAKE 1 TABLET BY MOUTH TWICE DAILY, Disp: 60 tablet, Rfl: 6 .  furosemide (LASIX) 40 MG tablet, Take 1 tablet (40 mg total) by mouth 2 (two) times daily., Disp: 180 tablet, Rfl: 3 .  gabapentin (NEURONTIN) 100 MG capsule, TK 1 C PO TID, Disp: , Rfl:  .  HUMALOG KWIKPEN 100 UNIT/ML KwikPen, INJECT 10 UNITS INTO THE SKIN WITH EACH MEAL, Disp: , Rfl:  .  insulin aspart (NOVOLOG FLEXPEN) 100 UNIT/ML FlexPen, 0-20 Units, Subcutaneous, 3 times daily with meals CBG < 70: implement hypoglycemia protocol CBG 70 - 120: 0 units CBG 121 - 150: 3 units CBG 151 - 200: 4 units CBG 201 - 250: 7 units CBG 251 - 300: 11 units CBG 301 - 350: 15 units CBG 351 - 400: 20 units CBG > 400: call MD, Disp: 15 mL, Rfl: 0 .  Insulin Glargine (LANTUS SOLOSTAR) 100 UNIT/ML Solostar Pen, Inject 50 Units into the skin daily at 10 pm., Disp: 15 mL, Rfl: 0 .  isosorbide-hydrALAZINE (BIDIL) 20-37.5 MG tablet, Take 2 tablets by mouth 3 (three) times daily., Disp: 180 tablet, Rfl: 6 .  potassium chloride SA (KLOR-CON) 20 MEQ tablet, Take 2 tablets (40 mEq total) by mouth daily., Disp: 180 tablet, Rfl: 3 .  spironolactone (ALDACTONE) 25 MG tablet, TAKE 1 TABLET(25 MG) BY MOUTH DAILY, Disp: 30 tablet, Rfl: 3 .  Tafamidis (  VYNDAMAX) 61 MG CAPS, Take 61 mg by mouth daily., Disp: 90 capsule, Rfl: 3 .  baclofen (LIORESAL) 10 MG tablet, TK 1 T PO BID PRN, Disp: , Rfl:  .  glucose blood (FREESTYLE TEST STRIPS) test strip, Use as instructed, Disp: 100 each, Rfl: 0 .  glucose monitoring kit (FREESTYLE) monitoring kit, 1 each by Does not apply route 4 (four) times daily - after meals and at bedtime. 1 month Diabetic Testing Supplies for QAC-QHS accuchecks., Disp: 1 each, Rfl: 1 .  Insulin Pen Needle 32G X 8 MM MISC, Use as directed, Disp: 100 each, Rfl: 0 .   ketorolac (ACULAR) 0.4 % SOLN, Place 1 drop into the left eye 4 (four) times daily., Disp: , Rfl:  .  Lancets (FREESTYLE) lancets, Use as instructed, Disp: 100 each, Rfl: 0 .  metFORMIN (GLUCOPHAGE-XR) 500 MG 24 hr tablet, TK 2 TS PO BID, Disp: , Rfl:  .  naproxen sodium (ALEVE) 220 MG tablet, Take 220 mg by mouth daily as needed (for pain or headache). , Disp: , Rfl:  .  ofloxacin (OCUFLOX) 0.3 % ophthalmic solution, Place 1 drop into the left eye 4 (four) times daily., Disp: , Rfl:  .  Oxycodone HCl 10 MG TABS, Take 10 mg by mouth 4 (four) times daily as needed (for pain)., Disp: , Rfl:  .  polyethylene glycol (MIRALAX / GLYCOLAX) packet, Take 17 g by mouth daily as needed for mild constipation., Disp: 14 each, Rfl: 0 .  prednisoLONE acetate (PRED FORTE) 1 % ophthalmic suspension, Place 1 drop into the left eye 4 (four) times daily., Disp: , Rfl:  Allergies  Allergen Reactions  . Morphine And Related Nausea Only    Severe nausea  . Penicillins Other (See Comments)    Pt states she has had a pain in her leg since a penicillin injection 2 months ago (reported 08/31/17).  Has tolerated amoxicillin oral.  Has patient had a PCN reaction causing immediate rash, facial/tongue/throat swelling, SOB or lightheadedness with hypotension: No Has patient had a PCN reaction causing severe rash involving mucus membranes or skin necrosis: No Has patient had a PCN reaction that required hospitalization: No Has patient had a PCN reaction occurring within the last 10 years: Yes If a      Social History   Socioeconomic History  . Marital status: Single    Spouse name: Not on file  . Number of children: 4  . Years of education: 70  . Highest education level: Not on file  Occupational History  . Not on file  Tobacco Use  . Smoking status: Former Smoker    Packs/day: 1.00    Years: 40.00    Pack years: 40.00    Types: Cigarettes    Start date: 06/23/1972    Quit date: 03/26/2012    Years since  quitting: 6.9  . Smokeless tobacco: Never Used  Substance and Sexual Activity  . Alcohol use: No  . Drug use: No  . Sexual activity: Yes  Other Topics Concern  . Not on file  Social History Narrative  . Not on file   Social Determinants of Health   Financial Resource Strain:   . Difficulty of Paying Living Expenses: Not on file  Food Insecurity:   . Worried About Charity fundraiser in the Last Year: Not on file  . Ran Out of Food in the Last Year: Not on file  Transportation Needs:   . Lack of Transportation (Medical): Not on  file  . Lack of Transportation (Non-Medical): Not on file  Physical Activity:   . Days of Exercise per Week: Not on file  . Minutes of Exercise per Session: Not on file  Stress:   . Feeling of Stress : Not on file  Social Connections:   . Frequency of Communication with Friends and Family: Not on file  . Frequency of Social Gatherings with Friends and Family: Not on file  . Attends Religious Services: Not on file  . Active Member of Clubs or Organizations: Not on file  . Attends Archivist Meetings: Not on file  . Marital Status: Not on file  Intimate Partner Violence:   . Fear of Current or Ex-Partner: Not on file  . Emotionally Abused: Not on file  . Physically Abused: Not on file  . Sexually Abused: Not on file    Physical Exam Cardiovascular:     Rate and Rhythm: Normal rate and regular rhythm.     Pulses: Normal pulses.  Pulmonary:     Effort: Pulmonary effort is normal.     Breath sounds: Normal breath sounds.  Musculoskeletal:        General: Normal range of motion.     Right lower leg: No edema.     Left lower leg: No edema.  Skin:    General: Skin is warm and dry.     Capillary Refill: Capillary refill takes less than 2 seconds.  Neurological:     Mental Status: She is alert and oriented to person, place, and time.  Psychiatric:        Mood and Affect: Mood normal.         Future Appointments  Date Time  Provider [place]  03/07/2019  8:05 AM [facility] DEVICE REMOTES [facility]  03/22/2019  8:40 AM Larey Dresser, MD [facility] None  06/06/2019  8:05 AM [facility] DEVICE REMOTES [facility]  09/05/2019  8:05 AM [facility] DEVICE [facility]OTES [facility]  12/05/2019  8:05 AM [facility] DEVICE [facility]OTES [facility]    BP (!) 176/82 (BP Location: Left Arm, Patient Position: Sitting, Cuff Size: Normal)   Pulse 88   Resp 16   Wt 226 lb (102.5 kg)   SpO2 99%   BMI 35.40 kg/m   Weight yesterday- 227 lb Last visit weight- 223 lb  Ms Choi was seen at home today and reported feeling well. She denied chest pain, SOB, headache, dizziness, orthopnea, fever or cough since our last visit. She stated she has been compliant with her medications over the past two weeks and her weight has been stable. She was noted to be hypertensive however she was arguing with her mechanic about her car while I was attempting to get vital signs, so that may have skewed her pressure. Her medications were verified but she only had enough to fill one box completely. I advised that she pick up the medications from the pharmacy and I will come back next week to finish her second box. She was understanding and agreeable.   [name], EMT 02/18/19  ACTION: Home visit completed Next visit planned for 1 week

## 2019-02-22 ENCOUNTER — Telehealth (HOSPITAL_COMMUNITY): Payer: Self-pay

## 2019-02-22 NOTE — Telephone Encounter (Signed)
I called Ms Hirose to see if she had picked up her medications so that I could finish filling her pillbox. She said she had not picked them up but would get them today and call me when she had them. I will reach out again towards the end of the week if I have not heard back from her.   [name], EMT 02/22/19

## 2019-02-24 ENCOUNTER — Telehealth (HOSPITAL_COMMUNITY): Payer: Self-pay

## 2019-02-24 NOTE — Telephone Encounter (Signed)
I called Ms Orpilla to see if she had picked up her medications. She stated she had them but was not home so asked if I could come tomorrow. I agreed and we agreed to meet at 09:00 tomorrow.  [name], EMT 02/24/19

## 2019-02-25 ENCOUNTER — Other Ambulatory Visit (HOSPITAL_COMMUNITY): Payer: Self-pay

## 2019-02-25 NOTE — Progress Notes (Signed)
Ms Sene was seen at home today and reported feeling well. The purpose of this visit was to fill her pillboxes after she had picked up her medications from the pharmacy. Nothing further was needed at this time. I will follow up in two weeks unless it becomes necessary sooner.   [name], EMT 02/25/19

## 2019-03-07 ENCOUNTER — Ambulatory Visit (INDEPENDENT_AMBULATORY_CARE_PROVIDER_SITE_OTHER): Payer: Medicare Other | Admitting: *Deleted

## 2019-03-07 DIAGNOSIS — I480 Paroxysmal atrial fibrillation: Secondary | ICD-10-CM | POA: Diagnosis not present

## 2019-03-07 LAB — CUP PACEART REMOTE DEVICE CHECK
Battery Remaining Longevity: 20 mo
Battery Voltage: 2.88 V
Brady Statistic AP VP Percent: 0.18 %
Brady Statistic AP VS Percent: 0.01 %
Brady Statistic AS VP Percent: 98.37 %
Brady Statistic AS VS Percent: 1.43 %
Brady Statistic RA Percent Paced: 0.19 %
Brady Statistic RV Percent Paced: 11.12 %
Date Time Interrogation Session: 20210208012404
HighPow Impedance: 68 Ohm
Implantable Lead Implant Date: 20141023
Implantable Lead Implant Date: 20141023
Implantable Lead Implant Date: 20141023
Implantable Lead Location: 753858
Implantable Lead Location: 753859
Implantable Lead Location: 753860
Implantable Lead Model: 4298
Implantable Lead Model: 5076
Implantable Lead Model: 6935
Implantable Pulse Generator Implant Date: 20141023
Lead Channel Impedance Value: 304 Ohm
Lead Channel Impedance Value: 342 Ohm
Lead Channel Impedance Value: 361 Ohm
Lead Channel Impedance Value: 418 Ohm
Lead Channel Impedance Value: 418 Ohm
Lead Channel Impedance Value: 475 Ohm
Lead Channel Impedance Value: 475 Ohm
Lead Channel Impedance Value: 532 Ohm
Lead Channel Impedance Value: 551 Ohm
Lead Channel Impedance Value: 551 Ohm
Lead Channel Impedance Value: 589 Ohm
Lead Channel Impedance Value: 608 Ohm
Lead Channel Impedance Value: 646 Ohm
Lead Channel Pacing Threshold Amplitude: 0.5 V
Lead Channel Pacing Threshold Amplitude: 0.625 V
Lead Channel Pacing Threshold Amplitude: 1.125 V
Lead Channel Pacing Threshold Pulse Width: 0.4 ms
Lead Channel Pacing Threshold Pulse Width: 0.4 ms
Lead Channel Pacing Threshold Pulse Width: 0.4 ms
Lead Channel Sensing Intrinsic Amplitude: 10.5 mV
Lead Channel Sensing Intrinsic Amplitude: 10.5 mV
Lead Channel Sensing Intrinsic Amplitude: 2.125 mV
Lead Channel Sensing Intrinsic Amplitude: 2.125 mV
Lead Channel Setting Pacing Amplitude: 1.75 V
Lead Channel Setting Pacing Amplitude: 2.5 V
Lead Channel Setting Pacing Amplitude: 2.75 V
Lead Channel Setting Pacing Pulse Width: 0.4 ms
Lead Channel Setting Pacing Pulse Width: 0.4 ms
Lead Channel Setting Sensing Sensitivity: 0.45 mV

## 2019-03-08 NOTE — Progress Notes (Signed)
ICD Remote  

## 2019-03-10 ENCOUNTER — Telehealth (HOSPITAL_COMMUNITY): Payer: Self-pay

## 2019-03-10 NOTE — Telephone Encounter (Signed)
I called Ms Burbank to schedule an appointment. She stated she would be available tomorrow so we agreed to meet at 13:00.   [name], EMT 03/10/19

## 2019-03-11 ENCOUNTER — Other Ambulatory Visit (HOSPITAL_COMMUNITY): Payer: Self-pay

## 2019-03-11 NOTE — Progress Notes (Signed)
Paramedicine Encounter    Patient ID: [name], female    DOB: 02/28/1950, 69 y.o.   MRN: [id number]   Patient Care Team: System, Pcp Not In as PCP - General [place], Connye Burkitt, LCSW as Social Worker (Licensed [employer] representative)  Patient Active Problem List   Diagnosis Date Noted  . Right foot infection 10/22/2017  . Cellulitis in diabetic foot ([place]) 10/22/2017  . Hyperglycemia 10/22/2017  . Lumbar radiculopathy 09/18/2017  . Degenerative spondylolisthesis 09/18/2017  . Atrial fibrillation ([place]) 02/11/2017  . Paroxysmal A-fib ([place])   . Biventricular automatic implantable cardioverter defibrillator in situ   . Sepsis ([place]) 04/20/2016  . UTI (urinary tract infection) 04/20/2016  . Dyspnea 07/19/2015  . Interstitial lung disease ([place]) 11/20/2014  . SIRS (systemic inflammatory response syndrome) ([place]) 02/03/2014  . IDDM (insulin dependent diabetes mellitus) 02/03/2014  . Tachycardia 06/14/2013  . Chronic systolic CHF (congestive heart failure) ([place]) 09/29/2012  . Hypoxia 03/06/2012  . Hypertensive heart disease 03/06/2012  . Nonischemic cardiomyopathy ([place]) 03/06/2012  . Tobacco abuse 03/06/2012  . Type 2 diabetes mellitus ([place]) 03/06/2012  . Hypertension 03/06/2012  . CAD (coronary artery disease), native coronary artery 03/06/2012  . Hypokalemia 03/06/2012  . Acute bronchitis 02/01/2009  . Sleep apnea 02/01/2009  . Chest pain 02/01/2009    Current Outpatient Medications:  .  acetaminophen (TYLENOL) 500 MG tablet, Take 1 tablet (500 mg total) by mouth every 6 (six) hours as needed., Disp: 30 tablet, Rfl: 0 .  albuterol (PROVENTIL HFA;VENTOLIN HFA) 108 (90 Base) MCG/ACT inhaler, Inhale 2 puffs into the lungs every 6 (six) hours as needed for wheezing or shortness of breath., Disp: 18 g, Rfl: 0 .  apixaban (ELIQUIS) 5 MG TABS tablet, Take 1 tablet (5 mg total) by mouth 2 (two) times daily., Disp: 60 tablet, Rfl: 3 .  atorvastatin (LIPITOR) 40 MG tablet, TAKE 1 TABLET(40  MG) BY MOUTH DAILY, Disp: 30 tablet, Rfl: 11 .  baclofen (LIORESAL) 10 MG tablet, TK 1 T PO BID PRN, Disp: , Rfl:  .  carvedilol (COREG) 25 MG tablet, TAKE 1 TABLET(25 MG) BY MOUTH TWICE DAILY WITH A MEAL, Disp: 180 tablet, Rfl: 3 .  ENTRESTO 97-103 MG, TAKE 1 TABLET BY MOUTH TWICE DAILY, Disp: 60 tablet, Rfl: 6 .  furosemide (LASIX) 40 MG tablet, Take 1 tablet (40 mg total) by mouth 2 (two) times daily., Disp: 180 tablet, Rfl: 3 .  gabapentin (NEURONTIN) 100 MG capsule, TK 1 C PO TID, Disp: , Rfl:  .  HUMALOG KWIKPEN 100 UNIT/ML KwikPen, INJECT 10 UNITS INTO THE SKIN WITH EACH MEAL, Disp: , Rfl:  .  insulin aspart (NOVOLOG FLEXPEN) 100 UNIT/ML FlexPen, 0-20 Units, Subcutaneous, 3 times daily with meals CBG < 70: implement hypoglycemia protocol CBG 70 - 120: 0 units CBG 121 - 150: 3 units CBG 151 - 200: 4 units CBG 201 - 250: 7 units CBG 251 - 300: 11 units CBG 301 - 350: 15 units CBG 351 - 400: 20 units CBG > 400: call MD, Disp: 15 mL, Rfl: 0 .  Insulin Glargine (LANTUS SOLOSTAR) 100 UNIT/ML Solostar Pen, Inject 50 Units into the skin daily at 10 pm., Disp: 15 mL, Rfl: 0 .  isosorbide-hydrALAZINE (BIDIL) 20-37.5 MG tablet, Take 2 tablets by mouth 3 (three) times daily., Disp: 180 tablet, Rfl: 6 .  ofloxacin (OCUFLOX) 0.3 % ophthalmic solution, Place 1 drop into the left eye 4 (four) times daily., Disp: , Rfl:  .  potassium chloride SA (  KLOR-CON) 20 MEQ tablet, Take 2 tablets (40 mEq total) by mouth daily., Disp: 180 tablet, Rfl: 3 .  spironolactone (ALDACTONE) 25 MG tablet, TAKE 1 TABLET(25 MG) BY MOUTH DAILY, Disp: 30 tablet, Rfl: 3 .  Tafamidis (VYNDAMAX) 61 MG CAPS, Take 61 mg by mouth daily., Disp: 90 capsule, Rfl: 3 .  glucose blood (FREESTYLE TEST STRIPS) test strip, Use as instructed, Disp: 100 each, Rfl: 0 .  glucose monitoring kit (FREESTYLE) monitoring kit, 1 each by Does not apply route 4 (four) times daily - after meals and at bedtime. 1 month Diabetic Testing Supplies for QAC-QHS  accuchecks., Disp: 1 each, Rfl: 1 .  Insulin Pen Needle 32G X 8 MM MISC, Use as directed, Disp: 100 each, Rfl: 0 .  ketorolac (ACULAR) 0.4 % SOLN, Place 1 drop into the left eye 4 (four) times daily., Disp: , Rfl:  .  Lancets (FREESTYLE) lancets, Use as instructed, Disp: 100 each, Rfl: 0 .  metFORMIN (GLUCOPHAGE-XR) 500 MG 24 hr tablet, TK 2 TS PO BID, Disp: , Rfl:  .  naproxen sodium (ALEVE) 220 MG tablet, Take 220 mg by mouth daily as needed (for pain or headache). , Disp: , Rfl:  .  Oxycodone HCl 10 MG TABS, Take 10 mg by mouth 4 (four) times daily as needed (for pain)., Disp: , Rfl:  .  polyethylene glycol (MIRALAX / GLYCOLAX) packet, Take 17 g by mouth daily as needed for mild constipation., Disp: 14 each, Rfl: 0 .  prednisoLONE acetate (PRED FORTE) 1 % ophthalmic suspension, Place 1 drop into the left eye 4 (four) times daily., Disp: , Rfl:  Allergies  Allergen Reactions  . Morphine And Related Nausea Only    Severe nausea  . Penicillins Other (See Comments)    Pt states she has had a pain in her leg since a penicillin injection 2 months ago (reported 08/31/17).  Has tolerated amoxicillin oral.  Has patient had a PCN reaction causing immediate rash, facial/tongue/throat swelling, SOB or lightheadedness with hypotension: No Has patient had a PCN reaction causing severe rash involving mucus membranes or skin necrosis: No Has patient had a PCN reaction that required hospitalization: No Has patient had a PCN reaction occurring within the last 10 years: Yes If a      Social History   Socioeconomic History  . Marital status: Single    Spouse name: Not on file  . Number of children: 4  . Years of education: 46  . Highest education level: Not on file  Occupational History  . Not on file  Tobacco Use  . Smoking status: Former Smoker    Packs/day: 1.00    Years: 40.00    Pack years: 40.00    Types: Cigarettes    Start date: 06/23/1972    Quit date: 03/26/2012    Years since  quitting: 6.9  . Smokeless tobacco: Never Used  Substance and Sexual Activity  . Alcohol use: No  . Drug use: No  . Sexual activity: Yes  Other Topics Concern  . Not on file  Social History Narrative  . Not on file   Social Determinants of Health   Financial Resource Strain:   . Difficulty of Paying Living Expenses: Not on file  Food Insecurity:   . Worried About Charity fundraiser in the Last Year: Not on file  . Ran Out of Food in the Last Year: Not on file  Transportation Needs:   . Lack of Transportation (Medical): Not on  file  . Lack of Transportation (Non-Medical): Not on file  Physical Activity:   . Days of Exercise per Week: Not on file  . Minutes of Exercise per Session: Not on file  Stress:   . Feeling of Stress : Not on file  Social Connections:   . Frequency of Communication with Friends and Family: Not on file  . Frequency of Social Gatherings with Friends and Family: Not on file  . Attends Religious Services: Not on file  . Active Member of Clubs or Organizations: Not on file  . Attends Archivist Meetings: Not on file  . Marital Status: Not on file  Intimate Partner Violence:   . Fear of Current or Ex-Partner: Not on file  . Emotionally Abused: Not on file  . Physically Abused: Not on file  . Sexually Abused: Not on file    Physical Exam Cardiovascular:     Rate and Rhythm: Normal rate and regular rhythm.     Pulses: Normal pulses.  Pulmonary:     Effort: Pulmonary effort is normal.     Breath sounds: Normal breath sounds.  Musculoskeletal:        General: Normal range of motion.     Right lower leg: No edema.     Left lower leg: No edema.  Skin:    General: Skin is warm and dry.     Capillary Refill: Capillary refill takes less than 2 seconds.  Neurological:     Mental Status: She is alert and oriented to person, place, and time.  Psychiatric:        Mood and Affect: Mood normal.         Future Appointments  Date Time  Provider [place]  03/22/2019  8:40 AM Larey Dresser, MD [facility] None  06/06/2019  8:05 AM [facility] DEVICE REMOTES [facility]  09/05/2019  8:05 AM [facility] DEVICE REMOTES [facility]  12/05/2019  8:05 AM [facility] DEVICE REMOTES [facility]    BP (!) 146/66 (BP Location: Left Arm, Patient Position: Sitting, Cuff Size: Normal)   Pulse 87   Resp 16   Wt 230 lb (104.3 kg)   SpO2 98%   BMI 36.02 kg/m   Weight yesterday- did not weigh Last visit weight- 226 lb  [name] was seen at home today and reported feeling well. She denied chest pain, SOB, headache, dizziness, orthopnea, fever or cough since our last visit. She stated she has been compliant with her medications over the past two weeks and her weight has been generally stable. Her medications were verified and her pillbox was refilled. I will follow up in two weeks.   [name], EMT 03/11/19  ACTION: Home visit completed Next visit planned for 2 weeks

## 2019-03-21 ENCOUNTER — Telehealth (HOSPITAL_COMMUNITY): Payer: Self-pay

## 2019-03-21 NOTE — Telephone Encounter (Signed)

## 2019-03-22 ENCOUNTER — Telehealth (HOSPITAL_COMMUNITY): Payer: Self-pay

## 2019-03-22 ENCOUNTER — Encounter (HOSPITAL_COMMUNITY): Payer: Self-pay | Admitting: Cardiology

## 2019-03-22 ENCOUNTER — Other Ambulatory Visit (HOSPITAL_COMMUNITY): Payer: Self-pay

## 2019-03-22 ENCOUNTER — Ambulatory Visit (HOSPITAL_COMMUNITY)
Admission: RE | Admit: 2019-03-22 | Discharge: 2019-03-22 | Disposition: A | Discharge status: Home / Self Care | Payer: Medicare Other | Source: Ambulatory Visit | Attending: Cardiology | Admitting: Cardiology

## 2019-03-22 ENCOUNTER — Other Ambulatory Visit: Payer: Self-pay

## 2019-03-22 VITALS — BP 110/62 | HR 90 | Wt 231.4 lb

## 2019-03-22 DIAGNOSIS — Z8616 Personal history of COVID-19: Secondary | ICD-10-CM | POA: Diagnosis not present

## 2019-03-22 DIAGNOSIS — Z95810 Presence of automatic (implantable) cardiac defibrillator: Secondary | ICD-10-CM | POA: Diagnosis not present

## 2019-03-22 DIAGNOSIS — I110 Hypertensive heart disease with heart failure: Secondary | ICD-10-CM | POA: Diagnosis not present

## 2019-03-22 DIAGNOSIS — I43 Cardiomyopathy in diseases classified elsewhere: Secondary | ICD-10-CM | POA: Diagnosis not present

## 2019-03-22 DIAGNOSIS — I428 Other cardiomyopathies: Secondary | ICD-10-CM | POA: Diagnosis not present

## 2019-03-22 DIAGNOSIS — Z9981 Dependence on supplemental oxygen: Secondary | ICD-10-CM | POA: Diagnosis not present

## 2019-03-22 DIAGNOSIS — I480 Paroxysmal atrial fibrillation: Secondary | ICD-10-CM | POA: Diagnosis not present

## 2019-03-22 DIAGNOSIS — I5022 Chronic systolic (congestive) heart failure: Secondary | ICD-10-CM

## 2019-03-22 DIAGNOSIS — Z96651 Presence of right artificial knee joint: Secondary | ICD-10-CM | POA: Diagnosis not present

## 2019-03-22 DIAGNOSIS — E854 Organ-limited amyloidosis: Secondary | ICD-10-CM

## 2019-03-22 DIAGNOSIS — G4733 Obstructive sleep apnea (adult) (pediatric): Secondary | ICD-10-CM | POA: Insufficient documentation

## 2019-03-22 DIAGNOSIS — Z794 Long term (current) use of insulin: Secondary | ICD-10-CM | POA: Diagnosis not present

## 2019-03-22 DIAGNOSIS — Z87891 Personal history of nicotine dependence: Secondary | ICD-10-CM | POA: Insufficient documentation

## 2019-03-22 DIAGNOSIS — Z96643 Presence of artificial hip joint, bilateral: Secondary | ICD-10-CM | POA: Insufficient documentation

## 2019-03-22 DIAGNOSIS — E785 Hyperlipidemia, unspecified: Secondary | ICD-10-CM | POA: Insufficient documentation

## 2019-03-22 DIAGNOSIS — Z7901 Long term (current) use of anticoagulants: Secondary | ICD-10-CM | POA: Diagnosis not present

## 2019-03-22 DIAGNOSIS — Z79899 Other long term (current) drug therapy: Secondary | ICD-10-CM | POA: Insufficient documentation

## 2019-03-22 DIAGNOSIS — I2510 Atherosclerotic heart disease of native coronary artery without angina pectoris: Secondary | ICD-10-CM | POA: Insufficient documentation

## 2019-03-22 DIAGNOSIS — E1140 Type 2 diabetes mellitus with diabetic neuropathy, unspecified: Secondary | ICD-10-CM | POA: Insufficient documentation

## 2019-03-22 LAB — BASIC METABOLIC PANEL
Anion gap: 11 (ref 5–15)
BUN: 16 mg/dL (ref 8–23)
CO2: 27 mmol/L (ref 22–32)
Calcium: 8.8 mg/dL — ABNORMAL LOW (ref 8.9–10.3)
Chloride: 100 mmol/L (ref 98–111)
Creatinine, Ser: 0.98 mg/dL (ref 0.44–1.00)
GFR calc Af Amer: >60 mL/min (ref >60)
GFR calc non Af Amer: >60 mL/min (ref >60)
Glucose, Bld: 326 mg/dL — ABNORMAL HIGH (ref 70–99)
Potassium: 3.4 mmol/L — ABNORMAL LOW (ref 3.5–5.1)
Sodium: 138 mmol/L (ref 135–145)

## 2019-03-22 NOTE — Telephone Encounter (Signed)
I called Ms Risse to schedule an appointment. She stated she would be available on Friday morning so we agreed to meet at 08:30.  [name], EMT 03/22/19

## 2019-03-22 NOTE — Patient Instructions (Signed)
Labs today We will only contact you if something comes back abnormal or we need to make some changes. Otherwise no news is good news!  USE CPAP EVERY NIGHT!  Your physician recommends that you schedule a follow-up appointment in: 3 months with Dr Aundra Dubin   Please call office at [phone] option 2 if you have any questions or concerns.    At the [facility], you and your health needs are our priority. As part of our continuing mission to provide you with exceptional heart care, we have created designated Provider Care Teams. These Care Teams include your primary Cardiologist (physician) and Advanced Practice Providers (APPs- Physician Assistants and Nurse Practitioners) who all work together to provide you with the care you need, when you need it.   You may see any of the following providers on your designated Care Team at your next follow up: Marland Kitchen Dr Glori Bickers . Dr [name] . Darrick Grinder, NP . Lyda Jester, PA . Audry Riles, PharmD   Please be sure to bring in all your medications bottles to every appointment.

## 2019-03-22 NOTE — Progress Notes (Signed)
Created in error

## 2019-03-22 NOTE — Progress Notes (Signed)
ReDS Vest / Clip - 03/22/19 0900      ReDS Vest / Clip   Station Marker  D    Ruler Value  33    ReDS Value Range  Low volume    ReDS Actual Value  33

## 2019-03-22 NOTE — Progress Notes (Signed)
Patient ID: [name], female   DOB: [birth year], 69 y.o.   MRN: [id number]   Advanced Heart Failure Clinic Note   PCP: Dr. Alyson Ingles Cardiology: Dr Dora Sims is a 69 y.o. female with history of nonischemic cardiomyopathy.  She was admitted with CHF exacerbation in 02/2012.  EF 20-25% on echo, LHC with nonobstructive CAD.  She was started on cardiac meds and discharged.  In 07/2012, she was admitted again with CHF exacerbation.  She had run out of Lasix.  She was taking her other heart medications as ordered, however.  She was diuresed and discharged. She had a chronic LBBB, and Medtronic CRT-D device was placed in 10/14.  She was admitted in 6/16 with hypertensive emergency and CHF exacerbation.   Also of note, she had PFTs in 9/16 showing restrictive spirometry with low lung volume and DLCO.  She was supposed to get a high resolution CT to evaluate for interstitial lung disease but never had the study.     Admitted March 2018 with urosepsis--> E Coli Bacteremia. Completed antibiotic course. EF was down from previous to 30-35%. Also had atrial fibrillation so she was loaded on amiodarone. Placed on eliquis. Discharge weight was 208 pounds.  She is now off amiodarone.   Ad[facility] to [facility] -> 02/13/16 with CP and dizziness. Found to be in Afib. Converted spontaneously overnight with BB and diuresis.   Echo 11/19 showed EF 35-40%.  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    Patient had COVID-19 infection in 10/20.   Echo in 11/20 showed EF 40-45% with mild LVH and mildly decreased RV systolic function.   She returns for followup of CHF.  Weight is up about 4 lbs.  She is not very active.  She uses CPAP but not regularly. She chronically sleeps on 3-4 pillows.  No dyspnea walking on flat ground.  Rare lightheadedness.  No chest pain.    Medtronic device interrogation: Fluid index < threshold with stable thoracic impedance.   >99% BiV pacing.     REDS clip 33%  Labs (2/14): SPEP negative, UPEP negative, HIV negative Labs (04/14/2017): K 3.8 Creatinine 0.94  Labs (9/19): K 3.3, creatinine 0.86 Labs (1/20): K 4, creatinine 0.87 Labs (2/20): K 4.2, creatinine 1.16 Labs (8/20): K 3.8, creatinine 0.78 Labs (11/20): LDL 104 Labs (12/20): K 3.6, creatinine 0.93  PMH: 1. HTN 2. Type II diabetes with neuropathy 3. Nonischemic cardiomyopathy: ? Due to HTN versus LBBB CMP.  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  Echo (2/14) with EF 20-25%.  Echo (7/14) with EF 25%, diffuse hypokinesis.  HIV, SPEP, UPEP negative.  Has LBBB. CRT-D 10/2012 (Medtronic).  Echo (4/15) with EF 45-50%, mild diffuse hypokinesis, PA systolic pressure 38 mmHg.  Echo (6/16) with EF 40-45%, mild LVH, septal and inferior hypokinesis.   - Echo (3/18): EF 30-35%. Grade 1 DD - Echo (6/18): EF 45-50%, moderate LVH, normal RV size with mildly decreased systolic function.  - Echo (11/19): EF 35-40%, moderate LVH, moderate diastolic dysfunction, normal RV size with mildly decreased systolic function, PASP 43 mmHg.  - Echo (11/20): EF 40-45%, mild LVH, mildly decreased RV systolic function.  4. Chronic LBBB 5. Right TKR 6. Bilateral THR.  7. Hyperlipidemia 8. ?COPD: Has oxygen for use with exertion.  - PFTs (9/16) with FEV1 77%, FVC 76%, ratio 101%, TLC 63%, DLCO 41% => moderate restrictive deficit  9. Atrial fibrillation: Paroxysmal.  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  11. COVID-19 infection in 10/20.  12. OSA: CPAP.  SH: Prior smoker, quit 2/14.  Never drank ETOH.  No drugs. Lives with son.   FH: Mother with "heart trouble."   Review of systems complete and found to be negative unless listed in HPI.    Current Outpatient Medications  Medication Sig Dispense Refill  . acetaminophen (TYLENOL) 500 MG tablet Take 1 tablet (500 mg total) by mouth every 6  (six) hours as needed. 30 tablet 0  . albuterol (PROVENTIL HFA;VENTOLIN HFA) 108 (90 Base) MCG/ACT inhaler Inhale 2 puffs into the lungs every 6 (six) hours as needed for wheezing or shortness of breath. 18 g 0  . apixaban (ELIQUIS) 5 MG TABS tablet Take 1 tablet (5 mg total) by mouth 2 (two) times daily. 60 tablet 3  . atorvastatin (LIPITOR) 40 MG tablet TAKE 1 TABLET(40 MG) BY MOUTH DAILY 30 tablet 11  . baclofen (LIORESAL) 10 MG tablet TK 1 T PO BID PRN    . carvedilol (COREG) 25 MG tablet TAKE 1 TABLET(25 MG) BY MOUTH TWICE DAILY WITH A MEAL 180 tablet 3  . ENTRESTO 97-103 MG TAKE 1 TABLET BY MOUTH TWICE DAILY 60 tablet 6  . furosemide (LASIX) 40 MG tablet Take 1 tablet (40 mg total) by mouth 2 (two) times daily. 180 tablet 3  . gabapentin (NEURONTIN) 100 MG capsule TK 1 C PO TID    . glucose blood (FREESTYLE TEST STRIPS) test strip Use as instructed 100 each 0  . glucose monitoring kit (FREESTYLE) monitoring kit 1 each by Does not apply route 4 (four) times daily - after meals and at bedtime. 1 month Diabetic Testing Supplies for QAC-QHS accuchecks. 1 each 1  . HUMALOG KWIKPEN 100 UNIT/ML KwikPen INJECT 10 UNITS INTO THE SKIN WITH EACH MEAL    . insulin aspart (NOVOLOG FLEXPEN) 100 UNIT/ML FlexPen 0-20 Units, Subcutaneous, 3 times daily with meals CBG < 70: implement hypoglycemia protocol CBG 70 - 120: 0 units CBG 121 - 150: 3 units CBG 151 - 200: 4 units CBG 201 - 250: 7 units CBG 251 - 300: 11 units CBG 301 - 350: 15 units CBG 351 - 400: 20 units CBG > 400: call MD 15 mL 0  . Insulin Glargine (LANTUS SOLOSTAR) 100 UNIT/ML Solostar Pen Inject 50 Units into the skin daily at 10 pm. 15 mL 0  . Insulin Pen Needle 32G X 8 MM MISC Use as directed 100 each 0  . isosorbide-hydrALAZINE (BIDIL) 20-37.5 MG tablet Take 2 tablets by mouth 3 (three) times daily. 180 tablet 6  . Lancets (FREESTYLE) lancets Use as instructed 100 each 0  . metFORMIN (GLUCOPHAGE-XR) 500 MG 24 hr tablet TK 2 TS PO BID     . naproxen sodium (ALEVE) 220 MG tablet Take 220 mg by mouth daily as needed (for pain or headache).     . Oxycodone HCl 10 MG TABS Take 10 mg by mouth 4 (four) times daily as needed (for pain).    . polyethylene glycol (MIRALAX / GLYCOLAX) packet Take 17 g by mouth daily as needed for mild constipation. 14 each 0  . potassium chloride SA (KLOR-CON) 20 MEQ tablet Take 2 tablets (40 mEq total) by mouth daily. 180 tablet 3  . spironolactone (ALDACTONE) 25 MG tablet TAKE 1 TABLET(25 MG) BY MOUTH DAILY 30 tablet 3  . Tafamidis (VYNDAMAX) 61 MG CAPS  Take 61 mg by mouth daily. 90 capsule 3   No current facility-administered medications for this encounter.   Vitals:   03/22/19 0835  BP: 110/62  Pulse: 90  SpO2: 96%  Weight: 105 kg (231 lb 6.4 oz)    Wt Readings from Last 3 Encounters:  03/22/19 105 kg (231 lb 6.4 oz)  03/11/19 104.3 kg (230 lb)  02/18/19 102.5 kg (226 lb)    Physical exam General: NAD Neck: JVP 8 cm, no thyromegaly or thyroid nodule.  Lungs: Clear to auscultation bilaterally with normal respiratory effort. CV: Nondisplaced PMI.  Heart regular S1/S2, no S3/S4, 2/6 SEM RUSB.  1+ ankle edema.  No carotid bruit.  Normal pedal pulses.  Abdomen: Soft, nontender, no hepatosplenomegaly, no distention.  Skin: Intact without lesions or rashes.  Neurologic: Alert and oriented x 3.  Psych: Normal affect. Extremities: No clubbing or cyanosis.  HEENT: Normal.   Assessment/Plan: 1. Chronic systolic CHF: Nonischemic cardiomyopathy, thought to be related to HTN. S/P CRT-D (Medtronic). Echo in 11/20 showed EF 40-45%. She is not volume overloaded by Optivol or REDS clip.  NYHA class II.  - Continue Coreg 25 mg BID.  - Continue Entresto 97/103 mg BID - Continue Bidil 2 tabs TID - Continue spiro 25 mg daily.  - Continue Lasix 40 mg bid.  BMET today.  - Continue paramedicine.   2. Hyperlipidemia: Lipids acceptable in 11/20.   3. HTN: BP now controlled.  4. PAF: No prolonged  episodes.    - Continue Eliquis for anticoagulation.  5. 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 Genetic testing was negative (wild type).  - Continue tafamidis.  6. OSA: I encouraged her to be more regular with CPAP use.   Followup in 3-4 months.    [name], MD  03/22/2019

## 2019-03-23 ENCOUNTER — Telehealth (HOSPITAL_COMMUNITY): Payer: Self-pay

## 2019-03-23 DIAGNOSIS — I5022 Chronic systolic (congestive) heart failure: Secondary | ICD-10-CM

## 2019-03-23 MED ORDER — POTASSIUM CHLORIDE CRYS ER 20 MEQ PO TBCR: 60.0000 meq | EXTENDED_RELEASE_TABLET | Freq: Every day | ORAL | 3 refills | Status: DC

## 2019-03-23 NOTE — Telephone Encounter (Signed)
-----   Message from Larey Dresser, MD sent at 03/22/2019  2:16 PM EST ----- Increase total daily K by 20 mEq.  BMET 10 days.

## 2019-03-25 ENCOUNTER — Other Ambulatory Visit (HOSPITAL_COMMUNITY): Payer: Self-pay

## 2019-03-25 NOTE — Progress Notes (Signed)
Paramedicine Encounter    Patient ID: [name], female    DOB: Aug 23, 1950, 69 y.o.   MRN: [id number]   Patient Care Team: System, Pcp Not In as PCP - General [place], Connye Burkitt, LCSW as Social Worker (Licensed [employer] representative)  Patient Active Problem List   Diagnosis Date Noted  . Right foot infection 10/22/2017  . Cellulitis in diabetic foot ([place]) 10/22/2017  . Hyperglycemia 10/22/2017  . Lumbar radiculopathy 09/18/2017  . Degenerative spondylolisthesis 09/18/2017  . Atrial fibrillation ([place]) 02/11/2017  . Paroxysmal A-fib ([place])   . Biventricular automatic implantable cardioverter defibrillator in situ   . Sepsis ([place]) 04/20/2016  . UTI (urinary tract infection) 04/20/2016  . Dyspnea 07/19/2015  . Interstitial lung disease ([place]) 11/20/2014  . SIRS (systemic inflammatory response syndrome) ([place]) 02/03/2014  . IDDM (insulin dependent diabetes mellitus) 02/03/2014  . Tachycardia 06/14/2013  . Chronic systolic CHF (congestive heart failure) ([place]) 09/29/2012  . Hypoxia 03/06/2012  . Hypertensive heart disease 03/06/2012  . Nonischemic cardiomyopathy ([place]) 03/06/2012  . Tobacco abuse 03/06/2012  . Type 2 diabetes mellitus ([place]) 03/06/2012  . Hypertension 03/06/2012  . CAD (coronary artery disease), native coronary artery 03/06/2012  . Hypokalemia 03/06/2012  . Acute bronchitis 02/01/2009  . Sleep apnea 02/01/2009  . Chest pain 02/01/2009    Current Outpatient Medications:  .  acetaminophen (TYLENOL) 500 MG tablet, Take 1 tablet (500 mg total) by mouth every 6 (six) hours as needed., Disp: 30 tablet, Rfl: 0 .  albuterol (PROVENTIL HFA;VENTOLIN HFA) 108 (90 Base) MCG/ACT inhaler, Inhale 2 puffs into the lungs every 6 (six) hours as needed for wheezing or shortness of breath., Disp: 18 g, Rfl: 0 .  apixaban (ELIQUIS) 5 MG TABS tablet, Take 1 tablet (5 mg total) by mouth 2 (two) times daily., Disp: 60 tablet, Rfl: 3 .  atorvastatin (LIPITOR) 40 MG tablet, TAKE 1 TABLET(40  MG) BY MOUTH DAILY, Disp: 30 tablet, Rfl: 11 .  baclofen (LIORESAL) 10 MG tablet, TK 1 T PO BID PRN, Disp: , Rfl:  .  carvedilol (COREG) 25 MG tablet, TAKE 1 TABLET(25 MG) BY MOUTH TWICE DAILY WITH A MEAL, Disp: 180 tablet, Rfl: 3 .  ENTRESTO 97-103 MG, TAKE 1 TABLET BY MOUTH TWICE DAILY, Disp: 60 tablet, Rfl: 6 .  furosemide (LASIX) 40 MG tablet, Take 1 tablet (40 mg total) by mouth 2 (two) times daily., Disp: 180 tablet, Rfl: 3 .  gabapentin (NEURONTIN) 100 MG capsule, TK 1 C PO TID, Disp: , Rfl:  .  isosorbide-hydrALAZINE (BIDIL) 20-37.5 MG tablet, Take 2 tablets by mouth 3 (three) times daily., Disp: 180 tablet, Rfl: 6 .  potassium chloride SA (KLOR-CON) 20 MEQ tablet, Take 3 tablets (60 mEq total) by mouth daily., Disp: 270 tablet, Rfl: 3 .  spironolactone (ALDACTONE) 25 MG tablet, TAKE 1 TABLET(25 MG) BY MOUTH DAILY, Disp: 30 tablet, Rfl: 3 .  Tafamidis (VYNDAMAX) 61 MG CAPS, Take 61 mg by mouth daily., Disp: 90 capsule, Rfl: 3 .  glucose blood (FREESTYLE TEST STRIPS) test strip, Use as instructed, Disp: 100 each, Rfl: 0 .  glucose monitoring kit (FREESTYLE) monitoring kit, 1 each by Does not apply route 4 (four) times daily - after meals and at bedtime. 1 month Diabetic Testing Supplies for QAC-QHS accuchecks., Disp: 1 each, Rfl: 1 .  HUMALOG KWIKPEN 100 UNIT/ML KwikPen, INJECT 10 UNITS INTO THE SKIN WITH EACH MEAL, Disp: , Rfl:  .  insulin aspart (NOVOLOG FLEXPEN) 100 UNIT/ML FlexPen, 0-20 Units, Subcutaneous, 3  times daily with meals CBG < 70: implement hypoglycemia protocol CBG 70 - 120: 0 units CBG 121 - 150: 3 units CBG 151 - 200: 4 units CBG 201 - 250: 7 units CBG 251 - 300: 11 units CBG 301 - 350: 15 units CBG 351 - 400: 20 units CBG > 400: call MD, Disp: 15 mL, Rfl: 0 .  Insulin Glargine (LANTUS SOLOSTAR) 100 UNIT/ML Solostar Pen, Inject 50 Units into the skin daily at 10 pm., Disp: 15 mL, Rfl: 0 .  Insulin Pen Needle 32G X 8 MM MISC, Use as directed, Disp: 100 each, Rfl: 0 .  Lancets  (FREESTYLE) lancets, Use as instructed, Disp: 100 each, Rfl: 0 .  metFORMIN (GLUCOPHAGE-XR) 500 MG 24 hr tablet, TK 2 TS PO BID, Disp: , Rfl:  .  naproxen sodium (ALEVE) 220 MG tablet, Take 220 mg by mouth daily as needed (for pain or headache). , Disp: , Rfl:  .  Oxycodone HCl 10 MG TABS, Take 10 mg by mouth 4 (four) times daily as needed (for pain)., Disp: , Rfl:  .  polyethylene glycol (MIRALAX / GLYCOLAX) packet, Take 17 g by mouth daily as needed for mild constipation., Disp: 14 each, Rfl: 0 Allergies  Allergen Reactions  . Morphine And Related Nausea Only    Severe nausea  . Penicillins Other (See Comments)    Pt states she has had a pain in her leg since a penicillin injection 2 months ago (reported 08/31/17).  Has tolerated amoxicillin oral.  Has patient had a PCN reaction causing immediate rash, facial/tongue/throat swelling, SOB or lightheadedness with hypotension: No Has patient had a PCN reaction causing severe rash involving mucus membranes or skin necrosis: No Has patient had a PCN reaction that required hospitalization: No Has patient had a PCN reaction occurring within the last 10 years: Yes If a      Social History   Socioeconomic History  . Marital status: Single    Spouse name: Not on file  . Number of children: 4  . Years of education: 40  . Highest education level: Not on file  Occupational History  . Not on file  Tobacco Use  . Smoking status: Former Smoker    Packs/day: 1.00    Years: 40.00    Pack years: 40.00    Types: Cigarettes    Start date: 06/23/1972    Quit date: 03/26/2012    Years since quitting: 7.0  . Smokeless tobacco: Never Used  Substance and Sexual Activity  . Alcohol use: No  . Drug use: No  . Sexual activity: Yes  Other Topics Concern  . Not on file  Social History Narrative  . Not on file   Social Determinants of Health   Financial Resource Strain:   . Difficulty of Paying Living Expenses: Not on file  Food Insecurity:   .  Worried About Charity fundraiser in the Last Year: Not on file  . Ran Out of Food in the Last Year: Not on file  Transportation Needs:   . Lack of Transportation (Medical): Not on file  . Lack of Transportation (Non-Medical): Not on file  Physical Activity:   . Days of Exercise per Week: Not on file  . Minutes of Exercise per Session: Not on file  Stress:   . Feeling of Stress : Not on file  Social Connections:   . Frequency of Communication with Friends and Family: Not on file  . Frequency of Social Gatherings with Friends  and Family: Not on file  . Attends Religious Services: Not on file  . Active Member of Clubs or Organizations: Not on file  . Attends Archivist Meetings: Not on file  . Marital Status: Not on file  Intimate Partner Violence:   . Fear of Current or Ex-Partner: Not on file  . Emotionally Abused: Not on file  . Physically Abused: Not on file  . Sexually Abused: Not on file    Physical Exam Cardiovascular:     Rate and Rhythm: Normal rate and regular rhythm.     Pulses: Normal pulses.  Pulmonary:     Effort: Pulmonary effort is normal.     Breath sounds: Normal breath sounds.  Abdominal:     General: There is no distension.  Musculoskeletal:        General: Normal range of motion.     Right lower leg: Edema present.     Left lower leg: No edema.  Skin:    General: Skin is warm and dry.     Capillary Refill: Capillary refill takes less than 2 seconds.  Neurological:     Mental Status: She is alert and oriented to person, place, and time.  Psychiatric:        Mood and Affect: Mood normal.         Future Appointments  Date Time Provider [place]  04/04/2019  9:30 AM [facility] LAB [facility] None  06/06/2019  8:05 AM [facility] DEVICE REMOTES [facility]  06/20/2019  8:40 AM Larey Dresser, MD [facility] None  09/05/2019  8:05 AM [facility] DEVICE REMOTES [facility]  12/05/2019  8:05 AM [facility] DEVICE REMOTES  [facility]    BP (!) 180/100 (BP Location: Left Arm, Patient Position: Sitting, Cuff Size: Normal)   Pulse 77   Resp 16   Wt 230 lb (104.3 kg)   SpO2 97%   BMI 36.02 kg/m   Weight yesterday- 230 lb Last visit weight- 231 lb  Ms Ketchem was seen at home today and reported feeling well. She denied chest pain, SOB, headache, dizziness, orthopnea, fever or cough since our last visit. She reported being compliant with her medications over the past week and her weight has been generally stable. Her medications were verified and her pillbox was refilled. I will follow up in two weeks unless it becomes necessary sooner.   [name], EMT 03/25/19  ACTION: Home visit completed Next visit planned for 2 weeks

## 2019-03-28 ENCOUNTER — Ambulatory Visit: Payer: Medicare Other | Attending: Internal Medicine

## 2019-03-28 DIAGNOSIS — Z23 Encounter for immunization: Secondary | ICD-10-CM

## 2019-03-28 NOTE — Progress Notes (Signed)
   Covid-19 Vaccination Clinic  Name:  [name]    MRN: [id number] DOB: 22-Jan-1951  03/28/2019  [name] was observed post Covid-19 immunization for 15 minutes without incidence. She was provided with Vaccine Information Sheet and instruction to access the V-Safe system.   [name] was instructed to call 911 with any severe reactions post vaccine: Marland Kitchen Difficulty breathing  . Swelling of your face and throat  . A fast heartbeat  . A bad rash all over your body  . Dizziness and weakness    Immunizations Administered    Name Date Dose VIS Date Route   Pfizer COVID-19 Vaccine 03/28/2019 10:22 AM 0.3 mL 01/07/2019 Intramuscular   Manufacturer: [place]   Lot: [id number]   [place]: [id number]

## 2019-04-04 ENCOUNTER — Ambulatory Visit (HOSPITAL_COMMUNITY)
Admission: RE | Admit: 2019-04-04 | Discharge: 2019-04-04 | Disposition: A | Discharge status: Home / Self Care | Payer: Medicare Other | Source: Ambulatory Visit | Attending: Cardiology | Admitting: Cardiology

## 2019-04-04 ENCOUNTER — Other Ambulatory Visit: Payer: Self-pay

## 2019-04-04 DIAGNOSIS — I5022 Chronic systolic (congestive) heart failure: Secondary | ICD-10-CM | POA: Diagnosis not present

## 2019-04-04 LAB — BASIC METABOLIC PANEL
Anion gap: 8 (ref 5–15)
BUN: 10 mg/dL (ref 8–23)
CO2: 28 mmol/L (ref 22–32)
Calcium: 8.9 mg/dL (ref 8.9–10.3)
Chloride: 102 mmol/L (ref 98–111)
Creatinine, Ser: 0.89 mg/dL (ref 0.44–1.00)
GFR calc Af Amer: >60 mL/min (ref >60)
GFR calc non Af Amer: >60 mL/min (ref >60)
Glucose, Bld: 314 mg/dL — ABNORMAL HIGH (ref 70–99)
Potassium: 3.8 mmol/L (ref 3.5–5.1)
Sodium: 138 mmol/L (ref 135–145)

## 2019-04-05 NOTE — Progress Notes (Signed)
[facility] Note  04/08/2019     CHIEF COMPLAINT Patient presents for Retina Follow Up   HISTORY OF PRESENT ILLNESS: RAYHANA SLIDER is a 69 y.o. female who presents to the clinic today for:   HPI    Retina Follow Up    Patient presents with  Diabetic Retinopathy.  In right eye.  This started weeks ago.  Severity is moderate.  Duration of weeks.  Since onset it is stable.  I, the attending physician,  performed the HPI with the patient and updated documentation appropriately.          Comments    Pt states her vision is about the same OU.  Pt denies eye pain or discomfort and denies any new or worsening floaters or fol OU.       Last edited by Bernarda Caffey, MD on 04/08/2019 12:23 PM. (History)    pt states she canceled her last appt due to being sick, she states her vision is stable since last visit   Referring physician: No referring provider defined for this encounter.  HISTORICAL INFORMATION:   Selected notes from the MEDICAL RECORD NUMBER Referred by Dr. Thurston Hole for concern of BRVO OD / ERM OD LEE: 02.10.20 (S. Bernstorf) [BCVA: OD: 20/25- OS: 20/25] Ocular Hx-DES OU, narrow angles PMH-DM (metformin, novolin, lantus), HTN, HLD, heart disease    CURRENT MEDICATIONS: No current outpatient medications on file. (Ophthalmic Drugs)   No current facility-administered medications for this visit. (Ophthalmic Drugs)   Current Outpatient Medications (Other)  Medication Sig  . acetaminophen (TYLENOL) 500 MG tablet Take 1 tablet (500 mg total) by mouth every 6 (six) hours as needed.  Marland Kitchen albuterol (PROVENTIL HFA;VENTOLIN HFA) 108 (90 Base) MCG/ACT inhaler Inhale 2 puffs into the lungs every 6 (six) hours as needed for wheezing or shortness of breath.  Marland Kitchen apixaban (ELIQUIS) 5 MG TABS tablet Take 1 tablet (5 mg total) by mouth 2 (two) times daily.  Marland Kitchen atorvastatin (LIPITOR) 40 MG tablet TAKE 1 TABLET(40 MG) BY MOUTH DAILY  . baclofen (LIORESAL)  10 MG tablet TK 1 T PO BID PRN  . carvedilol (COREG) 25 MG tablet TAKE 1 TABLET(25 MG) BY MOUTH TWICE DAILY WITH A MEAL  . ENTRESTO 97-103 MG TAKE 1 TABLET BY MOUTH TWICE DAILY  . furosemide (LASIX) 40 MG tablet Take 1 tablet (40 mg total) by mouth 2 (two) times daily.  Marland Kitchen gabapentin (NEURONTIN) 100 MG capsule TK 1 C PO TID  . glucose blood (FREESTYLE TEST STRIPS) test strip Use as instructed  . glucose monitoring kit (FREESTYLE) monitoring kit 1 each by Does not apply route 4 (four) times daily - after meals and at bedtime. 1 month Diabetic Testing Supplies for QAC-QHS accuchecks.  Marland Kitchen HUMALOG KWIKPEN 100 UNIT/ML KwikPen INJECT 10 UNITS INTO THE SKIN WITH EACH MEAL  . insulin aspart (NOVOLOG FLEXPEN) 100 UNIT/ML FlexPen 0-20 Units, Subcutaneous, 3 times daily with meals CBG < 70: implement hypoglycemia protocol CBG 70 - 120: 0 units CBG 121 - 150: 3 units CBG 151 - 200: 4 units CBG 201 - 250: 7 units CBG 251 - 300: 11 units CBG 301 - 350: 15 units CBG 351 - 400: 20 units CBG > 400: call MD  . Insulin Glargine (LANTUS SOLOSTAR) 100 UNIT/ML Solostar Pen Inject 50 Units into the skin daily at 10 pm.  . Insulin Pen Needle 32G X 8 MM MISC Use as directed  . isosorbide-hydrALAZINE (BIDIL) 20-37.5 MG  tablet Take 2 tablets by mouth 3 (three) times daily.  . Lancets (FREESTYLE) lancets Use as instructed  . metFORMIN (GLUCOPHAGE-XR) 500 MG 24 hr tablet TK 2 TS PO BID  . naproxen sodium (ALEVE) 220 MG tablet Take 220 mg by mouth daily as needed (for pain or headache).   . Oxycodone HCl 10 MG TABS Take 10 mg by mouth 4 (four) times daily as needed (for pain).  . polyethylene glycol (MIRALAX / GLYCOLAX) packet Take 17 g by mouth daily as needed for mild constipation.  . potassium chloride SA (KLOR-CON) 20 MEQ tablet Take 3 tablets (60 mEq total) by mouth daily.  Marland Kitchen spironolactone (ALDACTONE) 25 MG tablet TAKE 1 TABLET(25 MG) BY MOUTH DAILY  . Tafamidis (VYNDAMAX) 61 MG CAPS Take 61 mg by mouth daily.    No current facility-administered medications for this visit. (Other)      REVIEW OF SYSTEMS: ROS    Positive for: Endocrine, Cardiovascular, Eyes, Respiratory   Negative for: Constitutional, Gastrointestinal, Neurological, Skin, Genitourinary, Musculoskeletal, HENT, Psychiatric, Allergic/Imm, Heme/Lymph   Last edited by Doneen Poisson on 04/08/2019  8:58 AM. (History)       ALLERGIES Allergies  Allergen Reactions  . Morphine And Related Nausea Only    Severe nausea  . Penicillins Other (See Comments)    Pt states she has had a pain in her leg since a penicillin injection 2 months ago (reported 08/31/17).  Has tolerated amoxicillin oral.  Has patient had a PCN reaction causing immediate rash, facial/tongue/throat swelling, SOB or lightheadedness with hypotension: No Has patient had a PCN reaction causing severe rash involving mucus membranes or skin necrosis: No Has patient had a PCN reaction that required hospitalization: No Has patient had a PCN reaction occurring within the last 10 years: Yes If a    PAST MEDICAL HISTORY Past Medical History:  Diagnosis Date  . Anemia    a. Noted on 07/2012 labs, instructed to f/u PCP.  Marland Kitchen Arthritis    "joints" (11/18/2012)  . CAD (coronary artery disease), native coronary artery    a. Nonobstructive by cath 02/2012 (done because of low EF).  . Chronic bronchitis ([employer] [facility])    "~ every other year" (11/18/2012)  . Chronic combined systolic and diastolic CHF (congestive heart failure) ([place])    a. 03/05/12 echo:  LVEF 20-25%, moderate LVH , inferior and basal to mid septal akinesis, anterior moderate to severe hypokinesis and grade 2 diastolic dysfunction. b. EF 07/2012: EF still 25% (unclear medication compliance).  . Chronic lower back pain   . Headache(784.0)    "often; maybe not daily" (11/18/2012)  . High cholesterol   . History of noncompliance with medical treatment   . Hypertension   . LBBB (left bundle branch block)   . Orthopnea   .  Tobacco abuse   . Type II diabetes mellitus ([place])    Past Surgical History:  Procedure Laterality Date  . BI-VENTRICULAR IMPLANTABLE CARDIOVERTER DEFIBRILLATOR N/A 11/18/2012   Procedure: BI-VENTRICULAR IMPLANTABLE CARDIOVERTER DEFIBRILLATOR  (CRT-D);  Surgeon: Evans Lance, MD;  Location[facility] CATH LAB;  Service: Cardiovascular;  Laterality: N/A;  . BI-VENTRICULAR IMPLANTABLE CARDIOVERTER DEFIBRILLATOR  (CRT-D)  11/18/2012  . CARDIAC CATHETERIZATION  03/04/12   nonobstructive CAD, elevated LVEDP and tortuous vessels suggestive of long-standing hypertension  . COLONOSCOPY WITH PROPOFOL N/A 12/12/2016   Procedure: COLONOSCOPY WITH PROPOFOL;  Surgeon: Carol Ada, MD;  Location: [facility] ENDOSCOPY;  Service: Endoscopy;  Laterality: N/A;  . JOINT REPLACEMENT     Bilateral  hip and right knee  . LEFT HEART CATH N/A 03/05/2012   Procedure: LEFT HEART CATH;  Surgeon: Larey Dresser, MD;  Location: [facility] CATH LAB;  Service: Cardiovascular;  Laterality: N/A;    FAMILY HISTORY Family History  Problem Relation Age of Onset  . Heart disease Neg Hx     SOCIAL HISTORY Social History   Tobacco Use  . Smoking status: Former Smoker    Packs/day: 1.00    Years: 40.00    Pack years: 40.00    Types: Cigarettes    Start date: 06/23/1972    Quit date: 03/26/2012    Years since quitting: 7.0  . Smokeless tobacco: Never Used  Substance Use Topics  . Alcohol use: No  . Drug use: No         OPHTHALMIC EXAM:  Base Eye Exam    Visual Acuity (Snellen - Linear)      Right Left   Dist [place] 20/25 20/20 -2   Dist ph [place] NI        Tonometry (Tonopen, 8:59 AM)      Right Left   Pressure 15 16       Pupils      Dark Light Shape React APD   Right 2 1 Round Minimal 0   Left 2 1 Round Minimal 0       Visual Fields      Left Right    Full Full       Extraocular Movement      Right Left    Full Full       Neuro/Psych    Oriented x3: Yes   Mood/Affect: Normal       Dilation    Both eyes: 1.0%  Mydriacyl, 2.5% Phenylephrine @ 8:59 AM        Slit Lamp and Fundus Exam    Slit Lamp Exam      Right Left   Lids/Lashes Dermatochalasis - upper lid Dermatochalasis - upper lid   Conjunctiva/Sclera nasal Pinguecula, Melanosis nasal/temporal Pinguecula, Melanosis   Cornea Trace Punctate epithelial erosions, Well healed  IT cataract wounds 1-2+ Punctate epithelial erosions, well healed temporal cataract wounds   Anterior Chamber Deep and quiet deep, 0.5+ cell/pigment   Iris Round and dilated, No NVI Round and dilated, No NVI   Lens PC IOL in good position PC IOL in good position, trace Posterior capsular opacification   Vitreous Vitreous syneresis, mild cell/pigment on anterior vitreous Vitreous syneresis       Fundus Exam      Right Left   Disc +cupping, mild tilt, temporal PPA, Pink and Sharp sharp rim, +cupping, temporal PPA, mild tilt, Pallor   C/D Ratio 0.6 0.8   Macula Flat, Blunted foveal reflex, 3-4+ERM, no heme Good foveal reflex, trace cystic changes   Vessels Vascular attenuation, veins dilated/tortuous, AV crossing changes, white, sclerotic arteriole / BRAO at 1200 - good laser surrounding Vascular attenuation, Tortuous, Copper wiring, AV crossing changes   Periphery Attached, fibrotic NV at 1200 -- regressing ?seafan, good segmental PRP laser changes in area of BRAO and NV, no heme;  pigmented cystoid degeneration at 0600 Attached, no heme          IMAGING AND PROCEDURES  Imaging and Procedures for _0 @  OCT, Retina - OU - Both Eyes       Right Eye Quality was good. Central Foveal Thickness: 447. Progression has been stable. Findings include abnormal foveal contour, epiretinal membrane, no IRF, no SRF, preretinal  fibrosis, macular pucker (Thick ERM - no change from prior).   Left Eye Quality was good. Central Foveal Thickness: 239. Progression has been stable. Findings include normal foveal contour, no SRF, vitreomacular adhesion , intraretinal fluid (Interval  improvement in  cystic changes -- resolved).   Notes *Images captured and stored on drive  Diagnosis / Impression:  OD: thick ERM with macular pucker -- stable from prior OS: NFP, no SRF, mild VMA -- Interval improvement in  cystic changes -- resolved  Clinical management:  See below  Abbreviations: NFP - Normal foveal profile. CME - cystoid macular edema. PED - pigment epithelial detachment. IRF - intraretinal fluid. SRF - subretinal fluid. EZ - ellipsoid zone. ERM - epiretinal membrane. ORA - outer retinal atrophy. ORT - outer retinal tubulation. SRHM - subretinal hyper-reflective material        Fluorescein Angiography Optos (Transit OD)       Right Eye   Progression has improved. Early phase findings include microaneurysm, vascular perfusion defect, leakage, delayed filling (Patchy early choroidal filling; delayed venus return, retinal NV improved). Mid/Late phase findings include vascular perfusion defect, microaneurysm, leakage (Retinal NV improved).   Left Eye   Progression has been stable. Early phase findings include microaneurysm. Mid/Late phase findings include microaneurysm, leakage.   Notes Images stored on drive;   Impression: OD: superior vascular perfusion defect consistent with BRAO; proliferative retinopathy and NV improved/resolved, post BRAO v diabetic v sickle cell; scattered late leaking MA OS: moderate NPDR w/ late leaking MA, no NV -- stable from prior                  ASSESSMENT/PLAN:    ICD-10-CM   1. Proliferative retinopathy of right eye  H35.21 Fluorescein Angiography Optos (Transit OD)  2. BRAO (branch retinal artery occlusion), right  H34.231 Fluorescein Angiography Optos (Transit OD)  3. Epiretinal membrane (ERM) of right eye  H35.371   4. Retinal edema  H35.81 OCT, Retina - OU - Both Eyes  5. Moderate nonproliferative diabetic retinopathy of both eyes without macular edema associated with type 2 diabetes mellitus ([place])  L89.2119    6. Essential hypertension  I10   7. Hypertensive retinopathy of both eyes  H35.033 Fluorescein Angiography Optos (Transit OD)  8. Pseudophakia of both eyes  Z96.1     1,2. Proliferative retinopathy and BRAO OD  - sclerotic superior arteriole with distal fibrotic NV (?sea fan) and overlying preretinal and vitreous heme  - etiology could also be DM2 vs sickle cell retinopathy -- sickle cell screen negative  - s/p segmental PRP OD (2.17.2020) + fill-in PRP OD 7.13.20 - good laser changes in place  - repeat FA (3.12.21) shows interval improvement in NV and leakage  - improved view post cataract surgery  - f/u 9 months, DFE/OCT  3,4. Epiretinal membrane, OD  - fairly thick ERM overlying retina with pucker and distorted foveal profile  - OCT stable from prior, but  BCVA OD drastically improved to 20/20 from 20/60 post cataract surgery  - discussed finding and prognosis and treatment options in detail  - pt happy with vision OD and wishes to continue to monitor ERM for now  - f/u 9 months, sooner prn  5. Moderate nonproliferative diabetic retinopathy w/o DME, OS  - exam shows scattered MA OS, no NV  - FA (02.17.20) shows no NV OS  - OCT without diabetic macular edema, OU  - monitor  6,7. Hypertensive retinopathy OU  - discussed importance of tight BP control  -  monitor  8. Pseudophakia OU  - s/p CE/IOL (OD: Dr. Shirleen Schirmer, Jun 10, 2018, OS: Dr. Shirleen Schirmer, September 16, 2018 )  - beautiful surgeries, doing well  - monitor   Ophthalmic Meds Ordered this visit:  No orders of the defined types were placed in this encounter.      Return in about 9 months (around 01/08/2020) for f/u PDR / ERM OD, DFE, OCT.  There are no Patient Instructions on file for this visit.   Explained the diagnoses, plan, and follow up with the patient and they expressed understanding.  Patient expressed understanding of the importance of proper follow up care.   This document serves as a record of services  personally performed by Gardiner Sleeper, MD, PhD. It was created on their behalf by Ernest Mallick, OA, an ophthalmic assistant. The creation of this record is the provider's dictation and/or activities during the visit.    Electronically signed by: Ernest Mallick, OA 03.09.2021 12:24 PM   Gardiner Sleeper, M.D., Ph.D. Diseases & Surgery of the Retina and Vitreous [facility]  I have reviewed the above documentation for accuracy and completeness, and I agree with the above. Gardiner Sleeper, M.D., Ph.D. 04/08/19 12:26 PM   Abbreviations: M myopia (nearsighted); A astigmatism; H hyperopia (farsighted); P presbyopia; Mrx spectacle prescription;  CTL contact lenses; OD right eye; OS left eye; OU both eyes  XT exotropia; ET esotropia; PEK punctate epithelial keratitis; PEE punctate epithelial erosions; DES dry eye syndrome; MGD meibomian gland dysfunction; ATs artificial tears; PFAT's preservative free artificial tears; [place] nuclear sclerotic cataract; PSC posterior subcapsular cataract; ERM epi-retinal membrane; PVD posterior vitreous detachment; RD retinal detachment; DM diabetes mellitus; DR diabetic retinopathy; NPDR non-proliferative diabetic retinopathy; PDR proliferative diabetic retinopathy; CSME clinically significant macular edema; DME diabetic macular edema; dbh dot blot hemorrhages; CWS cotton wool spot; POAG primary open angle glaucoma; C/D cup-to-disc ratio; HVF humphrey visual field; GVF goldmann visual field; OCT optical coherence tomography; IOP intraocular pressure; BRVO Branch retinal vein occlusion; CRVO central retinal vein occlusion; CRAO central retinal artery occlusion; BRAO branch retinal artery occlusion; RT retinal tear; SB scleral buckle; PPV pars plana vitrectomy; VH Vitreous hemorrhage; PRP panretinal laser photocoagulation; IVK intravitreal kenalog; VMT vitreomacular traction; MH Macular hole;  NVD neovascularization of the disc; NVE neovascularization  elsewhere; AREDS age related eye disease study; ARMD age related macular degeneration; POAG primary open angle glaucoma; EBMD epithelial/anterior basement membrane dystrophy; ACIOL anterior chamber intraocular lens; IOL intraocular lens; PCIOL posterior chamber intraocular lens; Phaco/IOL phacoemulsification with intraocular lens placement; [place] photorefractive keratectomy; LASIK laser assisted in situ keratomileusis; HTN hypertension; DM diabetes mellitus; COPD chronic obstructive pulmonary disease

## 2019-04-08 ENCOUNTER — Ambulatory Visit (INDEPENDENT_AMBULATORY_CARE_PROVIDER_SITE_OTHER): Payer: Medicare Other | Admitting: Ophthalmology

## 2019-04-08 ENCOUNTER — Other Ambulatory Visit (HOSPITAL_COMMUNITY): Payer: Self-pay

## 2019-04-08 ENCOUNTER — Encounter (INDEPENDENT_AMBULATORY_CARE_PROVIDER_SITE_OTHER): Payer: Self-pay | Admitting: Ophthalmology

## 2019-04-08 DIAGNOSIS — H34231 Retinal artery branch occlusion, right eye: Secondary | ICD-10-CM | POA: Diagnosis not present

## 2019-04-08 DIAGNOSIS — H3521 Other non-diabetic proliferative retinopathy, right eye: Secondary | ICD-10-CM

## 2019-04-08 DIAGNOSIS — Z961 Presence of intraocular lens: Secondary | ICD-10-CM

## 2019-04-08 DIAGNOSIS — H35033 Hypertensive retinopathy, bilateral: Secondary | ICD-10-CM

## 2019-04-08 DIAGNOSIS — H3581 Retinal edema: Secondary | ICD-10-CM

## 2019-04-08 DIAGNOSIS — I10 Essential (primary) hypertension: Secondary | ICD-10-CM

## 2019-04-08 DIAGNOSIS — H35371 Puckering of macula, right eye: Secondary | ICD-10-CM

## 2019-04-08 DIAGNOSIS — E113393 Type 2 diabetes mellitus with moderate nonproliferative diabetic retinopathy without macular edema, bilateral: Secondary | ICD-10-CM

## 2019-04-08 NOTE — Progress Notes (Signed)
Paramedicine Encounter    Patient ID: [name], female    DOB: 04/03/1950, 69 y.o.   MRN: [id number]   Patient Care Team: System, Pcp Not In as PCP - General [place], Connye Burkitt, LCSW as Social Worker (Licensed Holiday representative)  Patient Active Problem List   Diagnosis Date Noted  . Right foot infection 10/22/2017  . Cellulitis in diabetic foot ([place]) 10/22/2017  . Hyperglycemia 10/22/2017  . Lumbar radiculopathy 09/18/2017  . Degenerative spondylolisthesis 09/18/2017  . Atrial fibrillation ([place]) 02/11/2017  . Paroxysmal A-fib ([place])   . Biventricular automatic implantable cardioverter defibrillator in situ   . Sepsis ([place]) 04/20/2016  . UTI (urinary tract infection) 04/20/2016  . Dyspnea 07/19/2015  . Interstitial lung disease ([place]) 11/20/2014  . SIRS (systemic inflammatory response syndrome) ([place]) 02/03/2014  . IDDM (insulin dependent diabetes mellitus) 02/03/2014  . Tachycardia 06/14/2013  . Chronic systolic CHF (congestive heart failure) ([place]) 09/29/2012  . Hypoxia 03/06/2012  . Hypertensive heart disease 03/06/2012  . Nonischemic cardiomyopathy ([place]) 03/06/2012  . Tobacco abuse 03/06/2012  . Type 2 diabetes mellitus ([place]) 03/06/2012  . Hypertension 03/06/2012  . CAD (coronary artery disease), native coronary artery 03/06/2012  . Hypokalemia 03/06/2012  . Acute bronchitis 02/01/2009  . Sleep apnea 02/01/2009  . Chest pain 02/01/2009    Current Outpatient Medications:  .  acetaminophen (TYLENOL) 500 MG tablet, Take 1 tablet (500 mg total) by mouth every 6 (six) hours as needed., Disp: 30 tablet, Rfl: 0 .  albuterol (PROVENTIL HFA;VENTOLIN HFA) 108 (90 Base) MCG/ACT inhaler, Inhale 2 puffs into the lungs every 6 (six) hours as needed for wheezing or shortness of breath., Disp: 18 g, Rfl: 0 .  apixaban (ELIQUIS) 5 MG TABS tablet, Take 1 tablet (5 mg total) by mouth 2 (two) times daily., Disp: 60 tablet, Rfl: 3 .  atorvastatin (LIPITOR) 40 MG tablet, TAKE 1 TABLET(40  MG) BY MOUTH DAILY, Disp: 30 tablet, Rfl: 11 .  baclofen (LIORESAL) 10 MG tablet, TK 1 T PO BID PRN, Disp: , Rfl:  .  carvedilol (COREG) 25 MG tablet, TAKE 1 TABLET(25 MG) BY MOUTH TWICE DAILY WITH A MEAL, Disp: 180 tablet, Rfl: 3 .  ENTRESTO 97-103 MG, TAKE 1 TABLET BY MOUTH TWICE DAILY, Disp: 60 tablet, Rfl: 6 .  furosemide (LASIX) 40 MG tablet, Take 1 tablet (40 mg total) by mouth 2 (two) times daily., Disp: 180 tablet, Rfl: 3 .  gabapentin (NEURONTIN) 100 MG capsule, TK 1 C PO TID, Disp: , Rfl:  .  glucose blood (FREESTYLE TEST STRIPS) test strip, Use as instructed, Disp: 100 each, Rfl: 0 .  glucose monitoring kit (FREESTYLE) monitoring kit, 1 each by Does not apply route 4 (four) times daily - after meals and at bedtime. 1 month Diabetic Testing Supplies for QAC-QHS accuchecks., Disp: 1 each, Rfl: 1 .  HUMALOG KWIKPEN 100 UNIT/ML KwikPen, INJECT 10 UNITS INTO THE SKIN WITH EACH MEAL, Disp: , Rfl:  .  insulin aspart (NOVOLOG FLEXPEN) 100 UNIT/ML FlexPen, 0-20 Units, Subcutaneous, 3 times daily with meals CBG < 70: implement hypoglycemia protocol CBG 70 - 120: 0 units CBG 121 - 150: 3 units CBG 151 - 200: 4 units CBG 201 - 250: 7 units CBG 251 - 300: 11 units CBG 301 - 350: 15 units CBG 351 - 400: 20 units CBG > 400: call MD, Disp: 15 mL, Rfl: 0 .  Insulin Glargine (LANTUS SOLOSTAR) 100 UNIT/ML Solostar Pen, Inject 50 Units into the skin daily at 10  pm., Disp: 15 mL, Rfl: 0 .  isosorbide-hydrALAZINE (BIDIL) 20-37.5 MG tablet, Take 2 tablets by mouth 3 (three) times daily., Disp: 180 tablet, Rfl: 6 .  naproxen sodium (ALEVE) 220 MG tablet, Take 220 mg by mouth daily as needed (for pain or headache). , Disp: , Rfl:  .  Oxycodone HCl 10 MG TABS, Take 10 mg by mouth 4 (four) times daily as needed (for pain)., Disp: , Rfl:  .  polyethylene glycol (MIRALAX / GLYCOLAX) packet, Take 17 g by mouth daily as needed for mild constipation., Disp: 14 each, Rfl: 0 .  potassium chloride SA (KLOR-CON) 20 MEQ  tablet, Take 3 tablets (60 mEq total) by mouth daily., Disp: 270 tablet, Rfl: 3 .  Insulin Pen Needle 32G X 8 MM MISC, Use as directed, Disp: 100 each, Rfl: 0 .  Lancets (FREESTYLE) lancets, Use as instructed, Disp: 100 each, Rfl: 0 .  metFORMIN (GLUCOPHAGE-XR) 500 MG 24 hr tablet, TK 2 TS PO BID, Disp: , Rfl:  .  spironolactone (ALDACTONE) 25 MG tablet, TAKE 1 TABLET(25 MG) BY MOUTH DAILY, Disp: 30 tablet, Rfl: 3 .  Tafamidis (VYNDAMAX) 61 MG CAPS, Take 61 mg by mouth daily., Disp: 90 capsule, Rfl: 3 Allergies  Allergen Reactions  . Morphine And Related Nausea Only    Severe nausea  . Penicillins Other (See Comments)    Pt states she has had a pain in her leg since a penicillin injection 2 months ago (reported 08/31/17).  Has tolerated amoxicillin oral.  Has patient had a PCN reaction causing immediate rash, facial/tongue/throat swelling, SOB or lightheadedness with hypotension: No Has patient had a PCN reaction causing severe rash involving mucus membranes or skin necrosis: No Has patient had a PCN reaction that required hospitalization: No Has patient had a PCN reaction occurring within the last 10 years: Yes If a      Social History   Socioeconomic History  . Marital status: Single    Spouse name: Not on file  . Number of children: 4  . Years of education: 42  . Highest education level: Not on file  Occupational History  . Not on file  Tobacco Use  . Smoking status: Former Smoker    Packs/day: 1.00    Years: 40.00    Pack years: 40.00    Types: Cigarettes    Start date: 06/23/1972    Quit date: 03/26/2012    Years since quitting: 7.0  . Smokeless tobacco: Never Used  Substance and Sexual Activity  . Alcohol use: No  . Drug use: No  . Sexual activity: Yes  Other Topics Concern  . Not on file  Social History Narrative  . Not on file   Social Determinants of Health   Financial Resource Strain:   . Difficulty of Paying Living Expenses:   Food Insecurity:   . Worried  About Charity fundraiser in the Last Year:   . Arboriculturist in the Last Year:   Transportation Needs:   . Film/video editor (Medical):   Marland Kitchen Lack of Transportation (Non-Medical):   Physical Activity:   . Days of Exercise per Week:   . Minutes of Exercise per Session:   Stress:   . Feeling of Stress :   Social Connections:   . Frequency of Communication with Friends and Family:   . Frequency of Social Gatherings with Friends and Family:   . Attends Religious Services:   . Active Member of Clubs or Organizations:   .  Attends Archivist Meetings:   Marland Kitchen Marital Status:   Intimate Partner Violence:   . Fear of Current or Ex-Partner:   . Emotionally Abused:   Marland Kitchen Physically Abused:   . Sexually Abused:     Physical Exam Cardiovascular:     Rate and Rhythm: Normal rate and regular rhythm.     Pulses: Normal pulses.  Pulmonary:     Effort: Pulmonary effort is normal.     Breath sounds: Normal breath sounds.  Musculoskeletal:        General: Normal range of motion.     Right lower leg: No edema.     Left lower leg: No edema.  Skin:    General: Skin is warm and dry.     Capillary Refill: Capillary refill takes less than 2 seconds.  Neurological:     Mental Status: She is alert and oriented to person, place, and time.  Psychiatric:        Mood and Affect: Mood normal.         Future Appointments  Date Time Provider [place]  04/20/2019 10:15 AM [facility]  06/06/2019  8:05 AM [facility] DEVICE REMOTES [facility]  06/20/2019  8:40 AM Larey Dresser, MD [facility] None  09/05/2019  8:05 AM [facility] DEVICE REMOTES [facility]  12/05/2019  8:05 AM [facility] DEVICE REMOTES [facility]  01/09/2020 10:00 AM Bernarda Caffey, MD [facility] None    BP 118/61 (BP Location: Left Arm, Patient Position: Sitting, Cuff Size: Normal)   Pulse 76   Resp 16   Wt 228 lb (103.4 kg)   SpO2 99%   BMI 35.71  kg/m   Weight yesterday- did not weigh Last visit weight- 230 lb  Ms Lobb was seen at home today and reported feeling well. She denied chest pain, SOB, headache, dizziness, orthopnea cough or fever since our last visit. She reported being compliant with her medications and her weight has remained stable. Her medications were verified and her pillbox was refilled. I will follow up in two weeks.  [name], EMT 04/08/19  ACTION: Home visit completed Next visit planned for 2 weeks

## 2019-04-12 ENCOUNTER — Other Ambulatory Visit (HOSPITAL_COMMUNITY): Payer: Self-pay

## 2019-04-12 MED ORDER — ENTRESTO 97-103 MG PO TABS: 1.0000 | ORAL_TABLET | Freq: Two times a day (BID) | ORAL | 11 refills | Status: DC

## 2019-04-20 ENCOUNTER — Ambulatory Visit: Payer: Medicare Other | Attending: Internal Medicine

## 2019-04-20 DIAGNOSIS — Z23 Encounter for immunization: Secondary | ICD-10-CM

## 2019-04-20 NOTE — Progress Notes (Signed)
   Covid-19 Vaccination Clinic  Name:  [name]    MRN: [id number] DOB: [birth year]  04/20/2019  [name] was observed post Covid-19 immunization for 15 minutes without incident. She was provided with Vaccine Information Sheet and instruction to access the V-Safe system.   [name] was instructed to call 911 with any severe reactions post vaccine: Marland Kitchen Difficulty breathing  . Swelling of face and throat  . A fast heartbeat  . A bad rash all over body  . Dizziness and weakness   Immunizations Administered    Name Date Dose VIS Date Route   Pfizer COVID-19 Vaccine 04/20/2019 10:24 AM 0.3 mL 01/07/2019 Intramuscular   Manufacturer: [place]   Lot: [id number]   [place]: [id number]

## 2019-04-22 ENCOUNTER — Telehealth (HOSPITAL_COMMUNITY): Payer: Self-pay

## 2019-04-22 NOTE — Telephone Encounter (Signed)
I called [name] to see if she was available for a visit today. She stated she was in [place] visiting family and forgot to let me know she was leaving town. She stated she had all of her medications with her and she would be back by Sunday evening. I will follow up Tuesday.   [name], EMT 04/22/19

## 2019-04-26 ENCOUNTER — Other Ambulatory Visit (HOSPITAL_COMMUNITY): Payer: Self-pay | Admitting: *Deleted

## 2019-04-26 ENCOUNTER — Other Ambulatory Visit (HOSPITAL_COMMUNITY): Payer: Self-pay

## 2019-04-26 MED ORDER — SPIRONOLACTONE 25 MG PO TABS: ORAL_TABLET | ORAL | 3 refills | Status: DC

## 2019-04-26 MED ORDER — ATORVASTATIN CALCIUM 40 MG PO TABS: ORAL_TABLET | ORAL | 11 refills | Status: DC

## 2019-04-26 MED ORDER — POTASSIUM CHLORIDE CRYS ER 20 MEQ PO TBCR: 60.0000 meq | EXTENDED_RELEASE_TABLET | Freq: Every day | ORAL | 3 refills | Status: DC

## 2019-04-26 MED ORDER — ENTRESTO 97-103 MG PO TABS: 1.0000 | ORAL_TABLET | Freq: Two times a day (BID) | ORAL | 11 refills | Status: DC

## 2019-04-26 MED ORDER — APIXABAN 5 MG PO TABS: 5.0000 mg | ORAL_TABLET | Freq: Two times a day (BID) | ORAL | 3 refills | Status: DC

## 2019-04-26 NOTE — Progress Notes (Signed)
Paramedicine Encounter    Patient ID: [name], female    DOB: August 25, 1950, 69 y.o.   MRN: [id number]   Patient Care Team: System, Pcp Not In as PCP - General [place], Connye Burkitt, LCSW as Social Worker (Licensed Holiday representative)  Patient Active Problem List   Diagnosis Date Noted  . Right foot infection 10/22/2017  . Cellulitis in diabetic foot ([place]) 10/22/2017  . Hyperglycemia 10/22/2017  . Lumbar radiculopathy 09/18/2017  . Degenerative spondylolisthesis 09/18/2017  . Atrial fibrillation ([place]) 02/11/2017  . Paroxysmal A-fib ([place])   . Biventricular automatic implantable cardioverter defibrillator in situ   . Sepsis ([place]) 04/20/2016  . UTI (urinary tract infection) 04/20/2016  . Dyspnea 07/19/2015  . Interstitial lung disease ([place]) 11/20/2014  . SIRS (systemic inflammatory response syndrome) ([place]) 02/03/2014  . IDDM (insulin dependent diabetes mellitus) 02/03/2014  . Tachycardia 06/14/2013  . Chronic systolic CHF (congestive heart failure) ([place]) 09/29/2012  . Hypoxia 03/06/2012  . Hypertensive heart disease 03/06/2012  . Nonischemic cardiomyopathy ([place]) 03/06/2012  . Tobacco abuse 03/06/2012  . Type 2 diabetes mellitus ([place]) 03/06/2012  . Hypertension 03/06/2012  . CAD (coronary artery disease), native coronary artery 03/06/2012  . Hypokalemia 03/06/2012  . Acute bronchitis 02/01/2009  . Sleep apnea 02/01/2009  . Chest pain 02/01/2009    Current Outpatient Medications:  .  acetaminophen (TYLENOL) 500 MG tablet, Take 1 tablet (500 mg total) by mouth every 6 (six) hours as needed., Disp: 30 tablet, Rfl: 0 .  albuterol (PROVENTIL HFA;VENTOLIN HFA) 108 (90 Base) MCG/ACT inhaler, Inhale 2 puffs into the lungs every 6 (six) hours as needed for wheezing or shortness of breath., Disp: 18 g, Rfl: 0 .  apixaban (ELIQUIS) 5 MG TABS tablet, Take 1 tablet (5 mg total) by mouth 2 (two) times daily., Disp: 60 tablet, Rfl: 3 .  atorvastatin (LIPITOR) 40 MG tablet, TAKE 1 TABLET(40  MG) BY MOUTH DAILY, Disp: 30 tablet, Rfl: 11 .  baclofen (LIORESAL) 10 MG tablet, TK 1 T PO BID PRN, Disp: , Rfl:  .  carvedilol (COREG) 25 MG tablet, TAKE 1 TABLET(25 MG) BY MOUTH TWICE DAILY WITH A MEAL, Disp: 180 tablet, Rfl: 3 .  furosemide (LASIX) 40 MG tablet, Take 1 tablet (40 mg total) by mouth 2 (two) times daily., Disp: 180 tablet, Rfl: 3 .  gabapentin (NEURONTIN) 100 MG capsule, TK 1 C PO TID, Disp: , Rfl:  .  isosorbide-hydrALAZINE (BIDIL) 20-37.5 MG tablet, Take 2 tablets by mouth 3 (three) times daily., Disp: 180 tablet, Rfl: 6 .  Oxycodone HCl 10 MG TABS, Take 10 mg by mouth 4 (four) times daily as needed (for pain)., Disp: , Rfl:  .  potassium chloride SA (KLOR-CON) 20 MEQ tablet, Take 3 tablets (60 mEq total) by mouth daily., Disp: 270 tablet, Rfl: 3 .  sacubitril-valsartan (ENTRESTO) 97-103 MG, Take 1 tablet by mouth 2 (two) times daily., Disp: 60 tablet, Rfl: 11 .  spironolactone (ALDACTONE) 25 MG tablet, TAKE 1 TABLET(25 MG) BY MOUTH DAILY, Disp: 30 tablet, Rfl: 3 .  Tafamidis (VYNDAMAX) 61 MG CAPS, Take 61 mg by mouth daily., Disp: 90 capsule, Rfl: 3 .  glucose blood (FREESTYLE TEST STRIPS) test strip, Use as instructed, Disp: 100 each, Rfl: 0 .  glucose monitoring kit (FREESTYLE) monitoring kit, 1 each by Does not apply route 4 (four) times daily - after meals and at bedtime. 1 month Diabetic Testing Supplies for QAC-QHS accuchecks., Disp: 1 each, Rfl: 1 .  HUMALOG KWIKPEN 100 UNIT/ML  KwikPen, INJECT 10 UNITS INTO THE SKIN WITH EACH MEAL, Disp: , Rfl:  .  insulin aspart (NOVOLOG FLEXPEN) 100 UNIT/ML FlexPen, 0-20 Units, Subcutaneous, 3 times daily with meals CBG < 70: implement hypoglycemia protocol CBG 70 - 120: 0 units CBG 121 - 150: 3 units CBG 151 - 200: 4 units CBG 201 - 250: 7 units CBG 251 - 300: 11 units CBG 301 - 350: 15 units CBG 351 - 400: 20 units CBG > 400: call MD, Disp: 15 mL, Rfl: 0 .  Insulin Glargine (LANTUS SOLOSTAR) 100 UNIT/ML Solostar Pen, Inject 50 Units  into the skin daily at 10 pm., Disp: 15 mL, Rfl: 0 .  Insulin Pen Needle 32G X 8 MM MISC, Use as directed, Disp: 100 each, Rfl: 0 .  Lancets (FREESTYLE) lancets, Use as instructed, Disp: 100 each, Rfl: 0 .  metFORMIN (GLUCOPHAGE-XR) 500 MG 24 hr tablet, TK 2 TS PO BID, Disp: , Rfl:  .  naproxen sodium (ALEVE) 220 MG tablet, Take 220 mg by mouth daily as needed (for pain or headache). , Disp: , Rfl:  .  polyethylene glycol (MIRALAX / GLYCOLAX) packet, Take 17 g by mouth daily as needed for mild constipation., Disp: 14 each, Rfl: 0 Allergies  Allergen Reactions  . Morphine And Related Nausea Only    Severe nausea  . Penicillins Other (See Comments)    Pt states she has had a pain in her leg since a penicillin injection 2 months ago (reported 08/31/17).  Has tolerated amoxicillin oral.  Has patient had a PCN reaction causing immediate rash, facial/tongue/throat swelling, SOB or lightheadedness with hypotension: No Has patient had a PCN reaction causing severe rash involving mucus membranes or skin necrosis: No Has patient had a PCN reaction that required hospitalization: No Has patient had a PCN reaction occurring within the last 10 years: Yes If a      Social History   Socioeconomic History  . Marital status: Single    Spouse name: Not on file  . Number of children: 4  . Years of education: 93  . Highest education level: Not on file  Occupational History  . Not on file  Tobacco Use  . Smoking status: Former Smoker    Packs/day: 1.00    Years: 40.00    Pack years: 40.00    Types: Cigarettes    Start date: 06/23/1972    Quit date: 03/26/2012    Years since quitting: 7.0  . Smokeless tobacco: Never Used  Substance and Sexual Activity  . Alcohol use: No  . Drug use: No  . Sexual activity: Yes  Other Topics Concern  . Not on file  Social History Narrative  . Not on file   Social Determinants of Health   Financial Resource Strain:   . Difficulty of Paying Living Expenses:    Food Insecurity:   . Worried About Charity fundraiser in the Last Year:   . Arboriculturist in the Last Year:   Transportation Needs:   . Film/video editor (Medical):   Marland Kitchen Lack of Transportation (Non-Medical):   Physical Activity:   . Days of Exercise per Week:   . Minutes of Exercise per Session:   Stress:   . Feeling of Stress :   Social Connections:   . Frequency of Communication with Friends and Family:   . Frequency of Social Gatherings with Friends and Family:   . Attends Religious Services:   . Active Member of Clubs  or Organizations:   . Attends Archivist Meetings:   Marland Kitchen Marital Status:   Intimate Partner Violence:   . Fear of Current or Ex-Partner:   . Emotionally Abused:   Marland Kitchen Physically Abused:   . Sexually Abused:     Physical Exam      Future Appointments  Date Time Provider [place]  06/06/2019  8:05 AM [facility] DEVICE REMOTES [facility]  06/20/2019  8:40 AM Larey Dresser, MD [facility] None  09/05/2019  8:05 AM [facility] DEVICE REMOTES [facility]  12/05/2019  8:05 AM [facility] DEV[facility] REMOTES [facility]  01/09/2020 10:00 AM Bernarda Caffey, MD [facility] None    There were no vitals taken for this visit.  Weight yesterday- did not weigh Last visit weight- 228 lb  [name] was seen at home today and reported feeling generally well. She denied chest pain, SOB, headache, dizziness, orthopnea, fever or cough over the past several weeks. She was not seen last week because she went out of town at the last minute and as a result she did not have her medications over the past three days. Today I was able to get her back on all medications but she did not have enough to fill two pillboxes. Additionally since she has been off her medications for the past three days I advised that we would forego vital signs and instead plan to meet next week when she has been back on her medications. She was understanding  and agreeable. I will follow up next week.   [name], EMT 04/26/19  ACTION: Home visit completed Next visit planned for 1 week

## 2019-05-05 ENCOUNTER — Other Ambulatory Visit (HOSPITAL_COMMUNITY): Payer: Self-pay

## 2019-05-05 NOTE — Progress Notes (Signed)
Paramedicine Encounter    Patient ID: [name], female    DOB: 07/06/1950, 68 y.o.   MRN: [id number]   Patient Care Team: System, Pcp Not In as PCP - General Uris, Jenna H, LCSW as Social Worker (Licensed Clinical Social Worker)  Patient Active Problem List   Diagnosis Date Noted  . Right foot infection 10/22/2017  . Cellulitis in diabetic foot (HCC) 10/22/2017  . Hyperglycemia 10/22/2017  . Lumbar radiculopathy 09/18/2017  . Degenerative spondylolisthesis 09/18/2017  . Atrial fibrillation (HCC) 02/11/2017  . Paroxysmal A-fib (HCC)   . Biventricular automatic implantable cardioverter defibrillator in situ   . Sepsis (HCC) 04/20/2016  . UTI (urinary tract infection) 04/20/2016  . Dyspnea 07/19/2015  . Interstitial lung disease (HCC) 11/20/2014  . SIRS (systemic inflammatory response syndrome) (HCC) 02/03/2014  . IDDM (insulin dependent diabetes mellitus) 02/03/2014  . Tachycardia 06/14/2013  . Chronic systolic CHF (congestive heart failure) (HCC) 09/29/2012  . Hypoxia 03/06/2012  . Hypertensive heart disease 03/06/2012  . Nonischemic cardiomyopathy (HCC) 03/06/2012  . Tobacco abuse 03/06/2012  . Type 2 diabetes mellitus (HCC) 03/06/2012  . Hypertension 03/06/2012  . CAD (coronary artery disease), native coronary artery 03/06/2012  . Hypokalemia 03/06/2012  . Acute bronchitis 02/01/2009  . Sleep apnea 02/01/2009  . Chest pain 02/01/2009    Current Outpatient Medications:  .  acetaminophen (TYLENOL) 500 MG tablet, Take 1 tablet (500 mg total) by mouth every 6 (six) hours as needed., Disp: 30 tablet, Rfl: 0 .  albuterol (PROVENTIL HFA;VENTOLIN HFA) 108 (90 Base) MCG/ACT inhaler, Inhale 2 puffs into the lungs every 6 (six) hours as needed for wheezing or shortness of breath., Disp: 18 g, Rfl: 0 .  apixaban (ELIQUIS) 5 MG TABS tablet, Take 1 tablet (5 mg total) by mouth 2 (two) times daily., Disp: 60 tablet, Rfl: 3 .  atorvastatin (LIPITOR) 40 MG tablet, TAKE 1 TABLET(40  MG) BY MOUTH DAILY, Disp: 30 tablet, Rfl: 11 .  carvedilol (COREG) 25 MG tablet, TAKE 1 TABLET(25 MG) BY MOUTH TWICE DAILY WITH A MEAL, Disp: 180 tablet, Rfl: 3 .  furosemide (LASIX) 40 MG tablet, Take 1 tablet (40 mg total) by mouth 2 (two) times daily., Disp: 180 tablet, Rfl: 3 .  gabapentin (NEURONTIN) 100 MG capsule, TK 1 C PO TID, Disp: , Rfl:  .  glucose blood (FREESTYLE TEST STRIPS) test strip, Use as instructed, Disp: 100 each, Rfl: 0 .  glucose monitoring kit (FREESTYLE) monitoring kit, 1 each by Does not apply route 4 (four) times daily - after meals and at bedtime. 1 month Diabetic Testing Supplies for QAC-QHS accuchecks., Disp: 1 each, Rfl: 1 .  HUMALOG KWIKPEN 100 UNIT/ML KwikPen, INJECT 10 UNITS INTO THE SKIN WITH EACH MEAL, Disp: , Rfl:  .  insulin aspart (NOVOLOG FLEXPEN) 100 UNIT/ML FlexPen, 0-20 Units, Subcutaneous, 3 times daily with meals CBG < 70: implement hypoglycemia protocol CBG 70 - 120: 0 units CBG 121 - 150: 3 units CBG 151 - 200: 4 units CBG 201 - 250: 7 units CBG 251 - 300: 11 units CBG 301 - 350: 15 units CBG 351 - 400: 20 units CBG > 400: call MD, Disp: 15 mL, Rfl: 0 .  Insulin Glargine (LANTUS SOLOSTAR) 100 UNIT/ML Solostar Pen, Inject 50 Units into the skin daily at 10 pm., Disp: 15 mL, Rfl: 0 .  Insulin Pen Needle 32G X 8 MM MISC, Use as directed, Disp: 100 each, Rfl: 0 .  isosorbide-hydrALAZINE (BIDIL) 20-37.5 MG tablet, Take 2 tablets   by mouth 3 (three) times daily., Disp: 180 tablet, Rfl: 6 .  Lancets (FREESTYLE) lancets, Use as instructed, Disp: 100 each, Rfl: 0 .  metFORMIN (GLUCOPHAGE-XR) 500 MG 24 hr tablet, TK 2 TS PO BID, Disp: , Rfl:  .  naproxen sodium (ALEVE) 220 MG tablet, Take 220 mg by mouth daily as needed (for pain or headache). , Disp: , Rfl:  .  Oxycodone HCl 10 MG TABS, Take 10 mg by mouth 4 (four) times daily as needed (for pain)., Disp: , Rfl:  .  polyethylene glycol (MIRALAX / GLYCOLAX) packet, Take 17 g by mouth daily as needed for mild  constipation., Disp: 14 each, Rfl: 0 .  potassium chloride SA (KLOR-CON) 20 MEQ tablet, Take 3 tablets (60 mEq total) by mouth daily., Disp: 270 tablet, Rfl: 3 .  sacubitril-valsartan (ENTRESTO) 97-103 MG, Take 1 tablet by mouth 2 (two) times daily., Disp: 60 tablet, Rfl: 11 .  spironolactone (ALDACTONE) 25 MG tablet, TAKE 1 TABLET(25 MG) BY MOUTH DAILY, Disp: 30 tablet, Rfl: 3 .  Tafamidis (VYNDAMAX) 61 MG CAPS, Take 61 mg by mouth daily., Disp: 90 capsule, Rfl: 3 .  baclofen (LIORESAL) 10 MG tablet, TK 1 T PO BID PRN, Disp: , Rfl:  Allergies  Allergen Reactions  . Morphine And Related Nausea Only    Severe nausea  . Penicillins Other (See Comments)    Pt states she has had a pain in her leg since a penicillin injection 2 months ago (reported 08/31/17).  Has tolerated amoxicillin oral.  Has patient had a PCN reaction causing immediate rash, facial/tongue/throat swelling, SOB or lightheadedness with hypotension: No Has patient had a PCN reaction causing severe rash involving mucus membranes or skin necrosis: No Has patient had a PCN reaction that required hospitalization: No Has patient had a PCN reaction occurring within the last 10 years: Yes If a      Social History   Socioeconomic History  . Marital status: Single    Spouse name: Not on file  . Number of children: 4  . Years of education: 11  . Highest education level: Not on file  Occupational History  . Not on file  Tobacco Use  . Smoking status: Former Smoker    Packs/day: 1.00    Years: 40.00    Pack years: 40.00    Types: Cigarettes    Start date: 06/23/1972    Quit date: 03/26/2012    Years since quitting: 7.1  . Smokeless tobacco: Never Used  Substance and Sexual Activity  . Alcohol use: No  . Drug use: No  . Sexual activity: Yes  Other Topics Concern  . Not on file  Social History Narrative  . Not on file   Social Determinants of Health   Financial Resource Strain:   . Difficulty of Paying Living Expenses:    Food Insecurity:   . Worried About Running Out of Food in the Last Year:   . Ran Out of Food in the Last Year:   Transportation Needs:   . Lack of Transportation (Medical):   . Lack of Transportation (Non-Medical):   Physical Activity:   . Days of Exercise per Week:   . Minutes of Exercise per Session:   Stress:   . Feeling of Stress :   Social Connections:   . Frequency of Communication with Friends and Family:   . Frequency of Social Gatherings with Friends and Family:   . Attends Religious Services:   . Active Member of Clubs   or Organizations:   . Attends Archivist Meetings:   Marland Kitchen Marital Status:   Intimate Partner Violence:   . Fear of Current or Ex-Partner:   . Emotionally Abused:   Marland Kitchen Physically Abused:   . Sexually Abused:     Physical Exam Cardiovascular:     Rate and Rhythm: Normal rate and regular rhythm.     Pulses: Normal pulses.  Pulmonary:     Effort: Pulmonary effort is normal.     Breath sounds: Normal breath sounds.  Abdominal:     General: Abdomen is flat.  Musculoskeletal:        General: Normal range of motion.     Right lower leg: No edema.     Left lower leg: No edema.  Skin:    General: Skin is warm and dry.     Capillary Refill: Capillary refill takes less than 2 seconds.  Neurological:     Mental Status: She is alert and oriented to person, place, and time.  Psychiatric:        Mood and Affect: Mood normal.         Future Appointments  Date Time Provider [place]  06/06/2019  8:05 AM [facility]  06/20/2019  8:40 AM Larey Dresser, MD [facility] None  09/05/2019  8:05 AM [facility]  12/05/2019  8:05 AM [facility]  01/09/2020 10:00 AM Bernarda Caffey, MD [facility] None    BP 135/66 (BP Location: Left Arm, Patient Position: Sitting, Cuff Size: Normal)   Pulse 84   Resp 18   Wt 232 lb 8 oz (105.5 kg)    SpO2 98%   BMI 36.41 kg/m   Weight yesterday- did not weigh Last visit weight- 227 lb  [name] was seen at home today and reported feeling well. She denied chest pain, SOB, headache, dizziness, orthopnea, fever or cough over the pats week. She stated she has been compliant with her medications and her weight has been stable. Her medications were verified and her pillbox was refilled. I will follow up in two weeks.   [name], EMT 05/05/19  ACTION: Home visit completed Next visit planned for 2 weeks

## 2019-05-09 ENCOUNTER — Other Ambulatory Visit: Payer: Self-pay | Admitting: Cardiology

## 2019-05-19 ENCOUNTER — Other Ambulatory Visit (HOSPITAL_COMMUNITY): Payer: Self-pay

## 2019-05-19 NOTE — Progress Notes (Signed)
Paramedicine Encounter    Patient ID: [name], female    DOB: 1950/06/20, 69 y.o.   MRN: [id number]   Patient Care Team: System, Pcp Not In as PCP - General Uris, Connye Burkitt, LCSW as Social Worker (Licensed Holiday representative)  Patient Active Problem List   Diagnosis Date Noted  . Right foot infection 10/22/2017  . Cellulitis in diabetic foot ([place]) 10/22/2017  . Hyperglycemia 10/22/2017  . Lumbar radiculopathy 09/18/2017  . Degenerative spondylolisthesis 09/18/2017  . Atrial fibrillation ([place]) 02/11/2017  . Paroxysmal A-fib ([place])   . Biventricular automatic implantable cardioverter defibrillator in situ   . Sepsis ([place]) 04/20/2016  . UTI (urinary tract infection) 04/20/2016  . Dyspnea 07/19/2015  . Interstitial lung disease ([place]) 11/20/2014  . SIRS (systemic inflammatory response syndrome) ([place]) 02/03/2014  . IDDM (insulin dependent diabetes mellitus) 02/03/2014  . Tachycardia 06/14/2013  . Chronic systolic CHF (congestive heart failure) ([place]) 09/29/2012  . Hypoxia 03/06/2012  . Hypertensive heart disease 03/06/2012  . Nonischemic cardiomyopathy ([place]) 03/06/2012  . Tobacco abuse 03/06/2012  . Type 2 diabetes mellitus ([place]) 03/06/2012  . Hypertension 03/06/2012  . CAD (coronary artery disease), native coronary artery 03/06/2012  . Hypokalemia 03/06/2012  . Acute bronchitis 02/01/2009  . Sleep apnea 02/01/2009  . Chest pain 02/01/2009    Current Outpatient Medications:  .  acetaminophen (TYLENOL) 500 MG tablet, Take 1 tablet (500 mg total) by mouth every 6 (six) hours as needed., Disp: 30 tablet, Rfl: 0 .  albuterol (PROVENTIL HFA;VENTOLIN HFA) 108 (90 Base) MCG/ACT inhaler, Inhale 2 puffs into the lungs every 6 (six) hours as needed for wheezing or shortness of breath., Disp: 18 g, Rfl: 0 .  apixaban (ELIQUIS) 5 MG TABS tablet, Take 1 tablet (5 mg total) by mouth 2 (two) times daily., Disp: 60 tablet, Rfl: 3 .  atorvastatin (LIPITOR) 40 MG tablet, TAKE 1 TABLET(40  MG) BY MOUTH DAILY, Disp: 30 tablet, Rfl: 11 .  carvedilol (COREG) 25 MG tablet, TAKE 1 TABLET(25 MG) BY MOUTH TWICE DAILY WITH A MEAL, Disp: 180 tablet, Rfl: 3 .  furosemide (LASIX) 40 MG tablet, Take 1 tablet (40 mg total) by mouth 2 (two) times daily., Disp: 180 tablet, Rfl: 3 .  gabapentin (NEURONTIN) 100 MG capsule, TK 1 C PO TID, Disp: , Rfl:  .  glucose blood (FREESTYLE TEST STRIPS) test strip, Use as instructed, Disp: 100 each, Rfl: 0 .  glucose monitoring kit (FREESTYLE) monitoring kit, 1 each by Does not apply route 4 (four) times daily - after meals and at bedtime. 1 month Diabetic Testing Supplies for QAC-QHS accuchecks., Disp: 1 each, Rfl: 1 .  HUMALOG KWIKPEN 100 UNIT/ML KwikPen, INJECT 10 UNITS INTO THE SKIN WITH EACH MEAL, Disp: , Rfl:  .  insulin aspart (NOVOLOG FLEXPEN) 100 UNIT/ML FlexPen, 0-20 Units, Subcutaneous, 3 times daily with meals CBG < 70: implement hypoglycemia protocol CBG 70 - 120: 0 units CBG 121 - 150: 3 units CBG 151 - 200: 4 units CBG 201 - 250: 7 units CBG 251 - 300: 11 units CBG 301 - 350: 15 units CBG 351 - 400: 20 units CBG > 400: call MD, Disp: 15 mL, Rfl: 0 .  isosorbide-hydrALAZINE (BIDIL) 20-37.5 MG tablet, Take 2 tablets by mouth 3 (three) times daily., Disp: 180 tablet, Rfl: 6 .  Lancets (FREESTYLE) lancets, Use as instructed, Disp: 100 each, Rfl: 0 .  Oxycodone HCl 10 MG TABS, Take 10 mg by mouth 4 (four) times daily as needed (for  pain)., Disp: , Rfl:  .  polyethylene glycol (MIRALAX / GLYCOLAX) packet, Take 17 g by mouth daily as needed for mild constipation., Disp: 14 each, Rfl: 0 .  potassium chloride SA (KLOR-CON) 20 MEQ tablet, Take 3 tablets (60 mEq total) by mouth daily., Disp: 270 tablet, Rfl: 3 .  sacubitril-valsartan (ENTRESTO) 97-103 MG, Take 1 tablet by mouth 2 (two) times daily., Disp: 60 tablet, Rfl: 11 .  spironolactone (ALDACTONE) 25 MG tablet, TAKE 1 TABLET(25 MG) BY MOUTH DAILY, Disp: 30 tablet, Rfl: 3 .  Tafamidis (VYNDAMAX) 61 MG  CAPS, Take 61 mg by mouth daily., Disp: 90 capsule, Rfl: 3 .  baclofen (LIORESAL) 10 MG tablet, TK 1 T PO BID PRN, Disp: , Rfl:  .  Insulin Glargine (LANTUS SOLOSTAR) 100 UNIT/ML Solostar Pen, Inject 50 Units into the skin daily at 10 pm., Disp: 15 mL, Rfl: 0 .  Insulin Pen Needle 32G X 8 MM MISC, Use as directed, Disp: 100 each, Rfl: 0 .  metFORMIN (GLUCOPHAGE-XR) 500 MG 24 hr tablet, TK 2 TS PO BID, Disp: , Rfl:  .  naproxen sodium (ALEVE) 220 MG tablet, Take 220 mg by mouth daily as needed (for pain or headache). , Disp: , Rfl:  Allergies  Allergen Reactions  . Morphine And Related Nausea Only    Severe nausea  . Penicillins Other (See Comments)    Pt states she has had a pain in her leg since a penicillin injection 2 months ago (reported 08/31/17).  Has tolerated amoxicillin oral.  Has patient had a PCN reaction causing immediate rash, facial/tongue/throat swelling, SOB or lightheadedness with hypotension: No Has patient had a PCN reaction causing severe rash involving mucus membranes or skin necrosis: No Has patient had a PCN reaction that required hospitalization: No Has patient had a PCN reaction occurring within the last 10 years: Yes If a      Social History   Socioeconomic History  . Marital status: Single    Spouse name: Not on file  . Number of children: 4  . Years of education: 71  . Highest education level: Not on file  Occupational History  . Not on file  Tobacco Use  . Smoking status: Former Smoker    Packs/day: 1.00    Years: 40.00    Pack years: 40.00    Types: Cigarettes    Start date: 06/23/1972    Quit date: 03/26/2012    Years since quitting: 7.1  . Smokeless tobacco: Never Used  Substance and Sexual Activity  . Alcohol use: No  . Drug use: No  . Sexual activity: Yes  Other Topics Concern  . Not on file  Social History Narrative  . Not on file   Social Determinants of Health   Financial Resource Strain:   . Difficulty of Paying Living Expenses:    Food Insecurity:   . Worried About Charity fundraiser in the Last Year:   . Arboriculturist in the Last Year:   Transportation Needs:   . Film/video editor (Medical):   Marland Kitchen Lack of Transportation (Non-Medical):   Physical Activity:   . Days of Exercise per Week:   . Minutes of Exercise per Session:   Stress:   . Feeling of Stress :   Social Connections:   . Frequency of Communication with Friends and Family:   . Frequency of Social Gatherings with Friends and Family:   . Attends Religious Services:   . Active Member of Clubs  or Organizations:   . Attends Archivist Meetings:   Marland Kitchen Marital Status:   Intimate Partner Violence:   . Fear of Current or Ex-Partner:   . Emotionally Abused:   Marland Kitchen Physically Abused:   . Sexually Abused:     Physical Exam Cardiovascular:     Rate and Rhythm: Normal rate and regular rhythm.     Pulses: Normal pulses.  Pulmonary:     Effort: Pulmonary effort is normal.     Breath sounds: Normal breath sounds.  Musculoskeletal:        General: Normal range of motion.     Right lower leg: No edema.     Left lower leg: No edema.  Skin:    General: Skin is warm and dry.     Capillary Refill: Capillary refill takes less than 2 seconds.  Neurological:     Mental Status: She is alert and oriented to person, place, and time.  Psychiatric:        Mood and Affect: Mood normal.         Future Appointments  Date Time Provider [place]  06/06/2019  8:05 AM [facility]  06/20/2019  8:40 AM Larey Dresser, MD [facility] None  09/05/2019  8:05 AM [facility]  12/05/2019  8:05 AM [facility]  01/09/2020 10:00 AM Bernarda Caffey, MD [facility] None    BP (!) 160/80 (BP Location: Left Arm, Patient Position: Sitting, Cuff Size: Normal)   Pulse 84   Resp 16   Wt 230 lb (104.3 kg)   SpO2 96%   BMI 36.02 kg/m   Weight  yesterday- did not weigh  Last visit weight- 232 lb  [name] was seen at home today and reported feeling well. She denied chest pain, SOB, headache, dizziness, orthopnea, fever or cough since our last visit. She has not been compliant with her medications over the past two weeks, having missed 5 consecutive nights of medications. Her weight it stable but she was found to be hypertensive. I stressed the importance of medication compliance and she was agreeable. Her medications were verified and her pillbox was refilled. I will follow up next week.   [name], EMT 05/19/19  ACTION: Home visit completed Next visit planned for 2 weeks

## 2019-06-07 ENCOUNTER — Other Ambulatory Visit (HOSPITAL_COMMUNITY): Payer: Self-pay

## 2019-06-07 ENCOUNTER — Telehealth: Payer: Self-pay

## 2019-06-07 NOTE — Progress Notes (Addendum)
Paramedicine Encounter    Patient ID: [name], female    DOB: [birth year], 69 y.o.   MRN: [id number]   Patient Care Team: System, Pcp Not In as PCP - General [place], Connye Burkitt, LCSW as Social Worker (Licensed Holiday representative)  Patient Active Problem List   Diagnosis Date Noted  . Right foot infection 10/22/2017  . Cellulitis in diabetic foot ([place]) 10/22/2017  . Hyperglycemia 10/22/2017  . Lumbar radiculopathy 09/18/2017  . Degenerative spondylolisthesis 09/18/2017  . Atrial fibrillation ([place]) 02/11/2017  . Paroxysmal A-fib ([place])   . Biventricular automatic implantable cardioverter defibrillator in situ   . Sepsis ([place]) 04/20/2016  . UTI (urinary tract infection) 04/20/2016  . Dyspnea 07/19/2015  . Interstitial lung disease ([place]) 11/20/2014  . SIRS (systemic inflammatory response syndrome) ([place]) 02/03/2014  . IDDM (insulin dependent diabetes mellitus) 02/03/2014  . Tachycardia 06/14/2013  . Chronic systolic CHF (congestive heart failure) ([place]) 09/29/2012  . Hypoxia 03/06/2012  . Hypertensive heart disease 03/06/2012  . Nonischemic cardiomyopathy ([place]) 03/06/2012  . Tobacco abuse 03/06/2012  . Type 2 diabetes mellitus ([place]) 03/06/2012  . Hypertension 03/06/2012  . CAD (coronary artery disease), native coronary artery 03/06/2012  . Hypokalemia 03/06/2012  . Acute bronchitis 02/01/2009  . Sleep apnea 02/01/2009  . Chest pain 02/01/2009    Current Outpatient Medications:  .  albuterol (PROVENTIL HFA;VENTOLIN HFA) 108 (90 Base) MCG/ACT inhaler, Inhale 2 puffs into the lungs every 6 (six) hours as needed for wheezing or shortness of breath., Disp: 18 g, Rfl: 0 .  apixaban (ELIQUIS) 5 MG TABS tablet, Take 1 tablet (5 mg total) by mouth 2 (two) times daily., Disp: 60 tablet, Rfl: 3 .  atorvastatin (LIPITOR) 40 MG tablet, TAKE 1 TABLET(40 MG) BY MOUTH DAILY, Disp: 30 tablet, Rfl: 11 .  carvedilol (COREG) 25 MG tablet, TAKE 1 TABLET(25 MG) BY MOUTH TWICE DAILY WITH A MEAL,  Disp: 180 tablet, Rfl: 3 .  furosemide (LASIX) 40 MG tablet, Take 1 tablet (40 mg total) by mouth 2 (two) times daily., Disp: 180 tablet, Rfl: 3 .  gabapentin (NEURONTIN) 100 MG capsule, TK 1 C PO TID, Disp: , Rfl:  .  isosorbide-hydrALAZINE (BIDIL) 20-37.5 MG tablet, Take 2 tablets by mouth 3 (three) times daily., Disp: 180 tablet, Rfl: 6 .  Lancets (FREESTYLE) lancets, Use as instructed, Disp: 100 each, Rfl: 0 .  Oxycodone HCl 10 MG TABS, Take 10 mg by mouth 4 (four) times daily as needed (for pain)., Disp: , Rfl:  .  potassium chloride SA (KLOR-CON) 20 MEQ tablet, Take 3 tablets (60 mEq total) by mouth daily., Disp: 270 tablet, Rfl: 3 .  sacubitril-valsartan (ENTRESTO) 97-103 MG, Take 1 tablet by mouth 2 (two) times daily., Disp: 60 tablet, Rfl: 11 .  spironolactone (ALDACTONE) 25 MG tablet, TAKE 1 TABLET(25 MG) BY MOUTH DAILY, Disp: 30 tablet, Rfl: 3 .  Tafamidis (VYNDAMAX) 61 MG CAPS, Take 61 mg by mouth daily., Disp: 90 capsule, Rfl: 3 .  acetaminophen (TYLENOL) 500 MG tablet, Take 1 tablet (500 mg total) by mouth every 6 (six) hours as needed., Disp: 30 tablet, Rfl: 0 .  baclofen (LIORESAL) 10 MG tablet, TK 1 T PO BID PRN, Disp: , Rfl:  .  glucose blood (FREESTYLE TEST STRIPS) test strip, Use as instructed, Disp: 100 each, Rfl: 0 .  glucose monitoring kit (FREESTYLE) monitoring kit, 1 each by Does not apply route 4 (four) times daily - after meals and at bedtime. 1 month Diabetic Testing Supplies for  QAC-QHS accuchecks., Disp: 1 each, Rfl: 1 .  HUMALOG KWIKPEN 100 UNIT/ML KwikPen, INJECT 10 UNITS INTO THE SKIN WITH EACH MEAL, Disp: , Rfl:  .  insulin aspart (NOVOLOG FLEXPEN) 100 UNIT/ML FlexPen, 0-20 Units, Subcutaneous, 3 times daily with meals CBG < 70: implement hypoglycemia protocol CBG 70 - 120: 0 units CBG 121 - 150: 3 units CBG 151 - 200: 4 units CBG 201 - 250: 7 units CBG 251 - 300: 11 units CBG 301 - 350: 15 units CBG 351 - 400: 20 units CBG > 400: call MD, Disp: 15 mL, Rfl: 0 .   Insulin Glargine (LANTUS SOLOSTAR) 100 UNIT/ML Solostar Pen, Inject 50 Units into the skin daily at 10 pm., Disp: 15 mL, Rfl: 0 .  Insulin Pen Wilson 32G X 8 MM MISC, Use as directed, Disp: 100 each, Rfl: 0 .  metFORMIN (GLUCOPHAGE-XR) 500 MG 24 hr tablet, TK 2 TS PO BID, Disp: , Rfl:  .  naproxen sodium (ALEVE) 220 MG tablet, Take 220 mg by mouth daily as needed (for pain or headache). , Disp: , Rfl:  .  polyethylene glycol (MIRALAX / GLYCOLAX) packet, Take 17 g by mouth daily as needed for mild constipation., Disp: 14 each, Rfl: 0 Allergies  Allergen Reactions  . Morphine And Related Nausea Only    Severe nausea  . Penicillins Other (See Comments)    Pt states she has had a pain in her leg since a penicillin injection 2 months ago (reported 08/31/17).  Has tolerated amoxicillin oral.  Has patient had a PCN reaction causing immediate rash, facial/tongue/throat swelling, SOB or lightheadedness with hypotension: No Has patient had a PCN reaction causing severe rash involving mucus membranes or skin necrosis: No Has patient had a PCN reaction that required hospitalization: No Has patient had a PCN reaction occurring within the last 10 years: Yes If a      Social History   Socioeconomic History  . Marital status: Single    Spouse name: Not on file  . Number of children: 4  . Years of education: 56  . Highest education level: Not on file  Occupational History  . Not on file  Tobacco Use  . Smoking status: Former Smoker    Packs/day: 1.00    Years: 40.00    Pack years: 40.00    Types: Cigarettes    Start date: 06/23/1972    Quit date: 03/26/2012    Years since quitting: 7.2  . Smokeless tobacco: Never Used  Substance and Sexual Activity  . Alcohol use: No  . Drug use: No  . Sexual activity: Yes  Other Topics Concern  . Not on file  Social History Narrative  . Not on file   Social Determinants of Health   Financial Resource Strain:   . Difficulty of Paying Living Expenses:    Food Insecurity:   . Worried About Charity fundraiser in the Last Year:   . Arboriculturist in the Last Year:   Transportation Needs:   . Film/video editor (Medical):   Marland Kitchen Lack of Transportation (Non-Medical):   Physical Activity:   . Days of Exercise per Week:   . Minutes of Exercise per Session:   Stress:   . Feeling of Stress :   Social Connections:   . Frequency of Communication with Friends and Family:   . Frequency of Social Gatherings with Friends and Family:   . Attends Religious Services:   . Active Member of Clubs  or Organizations:   . Attends Archivist Meetings:   Marland Kitchen Marital Status:   Intimate Partner Violence:   . Fear of Current or Ex-Partner:   . Emotionally Abused:   Marland Kitchen Physically Abused:   . Sexually Abused:     Physical Exam Cardiovascular:     Rate and Rhythm: Normal rate and regular rhythm.     Pulses: Normal pulses.  Pulmonary:     Effort: Pulmonary effort is normal.     Breath sounds: Normal breath sounds.  Musculoskeletal:        General: Normal range of motion.     Right lower leg: Edema present.     Left lower leg: Edema present.  Skin:    General: Skin is warm and dry.     Capillary Refill: Capillary refill takes less than 2 seconds.  Neurological:     Mental Status: She is alert and oriented to person, place, and time.  Psychiatric:        Mood and Affect: Mood normal.         Future Appointments  Date Time Provider [place]  06/20/2019  8:40 AM Larey Dresser, MD [facility] None  09/05/2019  8:05 AM [facility] DEVICE REMOTES [facility]  12/05/2019  8:05 AM [facility] DEVICE REMOTES [facility]  01/09/2020 10:00 AM Bernarda Caffey, MD [facility] None    BP 140/84 (BP Location: Left Arm, Patient Position: Sitting, Cuff Size: Normal)   Pulse 87   Resp 18   Wt 250 lb (113.4 kg)   SpO2 98%   BMI 39.16 kg/m   Weight yesterday- did not weigh Last visit weight- 230 lb  [name] was seen  at home today and reported feeling well with the exception of bilateral lower extremity edema. She denied chest pain, SOB, headache, dizziness, orthopnea, fever or cough since our last visit. Our last visit was on April 22 at which time I filled two pillboxes for her totaling 14 days worth of medicine. I called her last week and she stated she still had a box of medicine and did not need a visit until this week. Upon my arrival today she stated she ran out of medicine last night in her pillbox but insisted she has been taking her medications daily. I brought up the mathematical impossibility of this given that she said she only takes pills from her boxes and she admitted to missing a few days. She also stated she was at the beach over the weekend and ate a lot of crab. She stated she did not have any salt but then admitted to using butter and seasoning on the meat. She had a significant weight gain sionce our last visit however the clinic was closed by the time I saw [name] so I will contact them tomorrow for directions. Her medications were verified and one pillbox was refilled. I will follow up tomorrow.   [name], EMT 06/07/19  ACTION: Home visit completed Next visit planned for tomorrow

## 2019-06-07 NOTE — Telephone Encounter (Signed)
Spoke with patient to remind of missed remote transmission 

## 2019-06-08 ENCOUNTER — Other Ambulatory Visit (HOSPITAL_COMMUNITY): Payer: Self-pay

## 2019-06-08 NOTE — Progress Notes (Signed)
Ms Toguchi was seen at home today to amend her pillbox following an increase in weight over the past two weeks. Per Dr Aundra Dubin, I increased lasix to 80 mg BID for three days and gave a extra 20 mEq of potassium over that same period. I will follow up next week.   [name], EMT 06/08/19

## 2019-06-14 ENCOUNTER — Other Ambulatory Visit (HOSPITAL_COMMUNITY): Payer: Self-pay

## 2019-06-14 NOTE — Progress Notes (Signed)
Paramedicine Encounter    Patient ID: [name], female    DOB: 10-05-1950, 69 y.o.   MRN: [id number]   Patient Care Team: System, Pcp Not In as PCP - General [place], Connye Burkitt, LCSW as Social Worker (Licensed Holiday representative)  Patient Active Problem List   Diagnosis Date Noted  . Right foot infection 10/22/2017  . Cellulitis in diabetic foot ([place]) 10/22/2017  . Hyperglycemia 10/22/2017  . Lumbar radiculopathy 09/18/2017  . Degenerative spondylolisthesis 09/18/2017  . Atrial fibrillation ([place]) 02/11/2017  . Paroxysmal A-fib ([place])   . Biventricular automatic implantable cardioverter defibrillator in situ   . Sepsis ([place]) 04/20/2016  . UTI (urinary tract infection) 04/20/2016  . Dyspnea 07/19/2015  . Interstitial lung disease ([place]) 11/20/2014  . SIRS (systemic inflammatory response syndrome) ([place]) 02/03/2014  . IDDM (insulin dependent diabetes mellitus) 02/03/2014  . Tachycardia 06/14/2013  . Chronic systolic CHF (congestive heart failure) ([place]) 09/29/2012  . Hypoxia 03/06/2012  . Hypertensive heart disease 03/06/2012  . Nonischemic cardiomyopathy ([place]) 03/06/2012  . Tobacco abuse 03/06/2012  . Type 2 diabetes mellitus ([place]) 03/06/2012  . Hypertension 03/06/2012  . CAD (coronary artery disease), native coronary artery 03/06/2012  . Hypokalemia 03/06/2012  . Acute bronchitis 02/01/2009  . Sleep apnea 02/01/2009  . Chest pain 02/01/2009    Current Outpatient Medications:  .  acetaminophen (TYLENOL) 500 MG tablet, Take 1 tablet (500 mg total) by mouth every 6 (six) hours as needed., Disp: 30 tablet, Rfl: 0 .  albuterol (PROVENTIL HFA;VENTOLIN HFA) 108 (90 Base) MCG/ACT inhaler, Inhale 2 puffs into the lungs every 6 (six) hours as needed for wheezing or shortness of breath., Disp: 18 g, Rfl: 0 .  apixaban (ELIQUIS) 5 MG TABS tablet, Take 1 tablet (5 mg total) by mouth 2 (two) times daily., Disp: 60 tablet, Rfl: 3 .  atorvastatin (LIPITOR) 40 MG tablet, TAKE 1 TABLET(40  MG) BY MOUTH DAILY, Disp: 30 tablet, Rfl: 11 .  carvedilol (COREG) 25 MG tablet, TAKE 1 TABLET(25 MG) BY MOUTH TWICE DAILY WITH A MEAL, Disp: 180 tablet, Rfl: 3 .  furosemide (LASIX) 40 MG tablet, Take 1 tablet (40 mg total) by mouth 2 (two) times daily., Disp: 180 tablet, Rfl: 3 .  gabapentin (NEURONTIN) 100 MG capsule, TK 1 C PO TID, Disp: , Rfl:  .  isosorbide-hydrALAZINE (BIDIL) 20-37.5 MG tablet, Take 2 tablets by mouth 3 (three) times daily., Disp: 180 tablet, Rfl: 6 .  naproxen sodium (ALEVE) 220 MG tablet, Take 220 mg by mouth daily as needed (for pain or headache). , Disp: , Rfl:  .  Oxycodone HCl 10 MG TABS, Take 10 mg by mouth 4 (four) times daily as needed (for pain)., Disp: , Rfl:  .  polyethylene glycol (MIRALAX / GLYCOLAX) packet, Take 17 g by mouth daily as needed for mild constipation., Disp: 14 each, Rfl: 0 .  potassium chloride SA (KLOR-CON) 20 MEQ tablet, Take 3 tablets (60 mEq total) by mouth daily., Disp: 270 tablet, Rfl: 3 .  sacubitril-valsartan (ENTRESTO) 97-103 MG, Take 1 tablet by mouth 2 (two) times daily., Disp: 60 tablet, Rfl: 11 .  spironolactone (ALDACTONE) 25 MG tablet, TAKE 1 TABLET(25 MG) BY MOUTH DAILY, Disp: 30 tablet, Rfl: 3 .  Tafamidis (VYNDAMAX) 61 MG CAPS, Take 61 mg by mouth daily., Disp: 90 capsule, Rfl: 3 .  baclofen (LIORESAL) 10 MG tablet, TK 1 T PO BID PRN, Disp: , Rfl:  .  glucose blood (FREESTYLE TEST STRIPS) test strip, Use as instructed,  Disp: 100 each, Rfl: 0 .  glucose monitoring kit (FREESTYLE) monitoring kit, 1 each by Does not apply route 4 (four) times daily - after meals and at bedtime. 1 month Diabetic Testing Supplies for QAC-QHS accuchecks., Disp: 1 each, Rfl: 1 .  HUMALOG KWIKPEN 100 UNIT/ML KwikPen, INJECT 10 UNITS INTO THE SKIN WITH EACH MEAL, Disp: , Rfl:  .  insulin aspart (NOVOLOG FLEXPEN) 100 UNIT/ML FlexPen, 0-20 Units, Subcutaneous, 3 times daily with meals CBG < 70: implement hypoglycemia protocol CBG 70 - 120: 0 units CBG 121 -  150: 3 units CBG 151 - 200: 4 units CBG 201 - 250: 7 units CBG 251 - 300: 11 units CBG 301 - 350: 15 units CBG 351 - 400: 20 units CBG > 400: call MD, Disp: 15 mL, Rfl: 0 .  Insulin Glargine (LANTUS SOLOSTAR) 100 UNIT/ML Solostar Pen, Inject 50 Units into the skin daily at 10 pm., Disp: 15 mL, Rfl: 0 .  Insulin Pen Needle 32G X 8 MM MISC, Use as directed, Disp: 100 each, Rfl: 0 .  Lancets (FREESTYLE) lancets, Use as instructed, Disp: 100 each, Rfl: 0 .  metFORMIN (GLUCOPHAGE-XR) 500 MG 24 hr tablet, TK 2 TS PO BID, Disp: , Rfl:  Allergies  Allergen Reactions  . Morphine And Related Nausea Only    Severe nausea  . Penicillins Other (See Comments)    Pt states she has had a pain in her leg since a penicillin injection 2 months ago (reported 08/31/17).  Has tolerated amoxicillin oral.  Has patient had a PCN reaction causing immediate rash, facial/tongue/throat swelling, SOB or lightheadedness with hypotension: No Has patient had a PCN reaction causing severe rash involving mucus membranes or skin necrosis: No Has patient had a PCN reaction that required hospitalization: No Has patient had a PCN reaction occurring within the last 10 years: Yes If a      Social History   Socioeconomic History  . Marital status: Single    Spouse name: Not on file  . Number of children: 4  . Years of education: 80  . Highest education level: Not on file  Occupational History  . Not on file  Tobacco Use  . Smoking status: Former Smoker    Packs/day: 1.00    Years: 40.00    Pack years: 40.00    Types: Cigarettes    Start date: 06/23/1972    Quit date: 03/26/2012    Years since quitting: 7.2  . Smokeless tobacco: Never Used  Substance and Sexual Activity  . Alcohol use: No  . Drug use: No  . Sexual activity: Yes  Other Topics Concern  . Not on file  Social History Narrative  . Not on file   Social Determinants of Health   Financial Resource Strain:   . Difficulty of Paying Living Expenses:    Food Insecurity:   . Worried About Charity fundraiser in the Last Year:   . Arboriculturist in the Last Year:   Transportation Needs:   . Film/video editor (Medical):   Marland Kitchen Lack of Transportation (Non-Medical):   Physical Activity:   . Days of Exercise per Week:   . Minutes of Exercise per Session:   Stress:   . Feeling of Stress :   Social Connections:   . Frequency of Communication with Friends and Family:   . Frequency of Social Gatherings with Friends and Family:   . Attends Religious Services:   . Active Member of Clubs  or Organizations:   . Attends Archivist Meetings:   Marland Kitchen Marital Status:   Intimate Partner Violence:   . Fear of Current or Ex-Partner:   . Emotionally Abused:   Marland Kitchen Physically Abused:   . Sexually Abused:     Physical Exam Cardiovascular:     Rate and Rhythm: Normal rate and regular rhythm.     Pulses: Normal pulses.  Pulmonary:     Effort: Pulmonary effort is normal.     Breath sounds: Normal breath sounds.  Musculoskeletal:        General: Normal range of motion.     Right lower leg: No edema.     Left lower leg: No edema.  Skin:    General: Skin is warm and dry.     Capillary Refill: Capillary refill takes less than 2 seconds.  Neurological:     Mental Status: She is alert and oriented to person, place, and time.  Psychiatric:        Mood and Affect: Mood normal.         Future Appointments  Date Time Provider [place]  06/20/2019  8:40 AM Larey Dresser, MD [facility] None  09/05/2019  8:05 AM [facility] DEVICE REMOTES [facility]  12/05/2019  8:05 AM [facility] DEVICE REMOTES [facility]  01/09/2020 10:00 AM Bernarda Caffey, MD [facility] None    BP 107/60 (BP Location: Left Arm, Patient Position: Sitting, Cuff Size: Normal)   Pulse 80   Resp 16   Wt 239 lb (108.4 kg)   SpO2 97%   BMI 37.43 kg/m   Weight yesterday- did not weigh  Last visit weight- 250 lb  Ms Plasse was seen at home  today and reported feeling well. She denied chest pain, SOB, headache, dizziness orthopnea, fever or cough. She stated she has been compliant with her medications over the past week and her weight has begun trending down since restarting all her medications. Her medications were verified and her pillbox was refilled. I left her with the task of ordering a refill or Vyndamax via [facility] and she was agreeable. I will follow up in two weeks.   [name], EMT 06/14/19  ACTION: Home visit completed Next visit planned for 2 weeks

## 2019-06-20 ENCOUNTER — Encounter (HOSPITAL_COMMUNITY): Payer: Medicare Other | Admitting: Cardiology

## 2019-06-28 ENCOUNTER — Other Ambulatory Visit (HOSPITAL_COMMUNITY): Payer: Self-pay

## 2019-06-28 NOTE — Progress Notes (Signed)
Paramedicine Encounter    Patient ID: [name], female    DOB: [birth year], 69 y.o.   MRN: [id number]   Patient Care Team: System, Pcp Not In as PCP - General [place], Connye Burkitt, LCSW as Social Worker (Licensed Holiday representative)  Patient Active Problem List   Diagnosis Date Noted  . Right foot infection 10/22/2017  . Cellulitis in diabetic foot ([place]) 10/22/2017  . Hyperglycemia 10/22/2017  . Lumbar radiculopathy 09/18/2017  . Degenerative spondylolisthesis 09/18/2017  . Atrial fibrillation ([place]) 02/11/2017  . Paroxysmal A-fib ([place])   . Biventricular automatic implantable cardioverter defibrillator in situ   . Sepsis ([place]) 04/20/2016  . UTI (urinary tract infection) 04/20/2016  . Dyspnea 07/19/2015  . Interstitial lung disease ([place]) 11/20/2014  . SIRS (systemic inflammatory response syndrome) ([place]) 02/03/2014  . IDDM (insulin dependent diabetes mellitus) 02/03/2014  . Tachycardia 06/14/2013  . Chronic systolic CHF (congestive heart failure) ([place]) 09/29/2012  . Hypoxia 03/06/2012  . Hypertensive heart disease 03/06/2012  . Nonischemic cardiomyopathy ([place]) 03/06/2012  . Tobacco abuse 03/06/2012  . Type 2 diabetes mellitus ([place]) 03/06/2012  . Hypertension 03/06/2012  . CAD (coronary artery disease), native coronary artery 03/06/2012  . Hypokalemia 03/06/2012  . Acute bronchitis 02/01/2009  . Sleep apnea 02/01/2009  . Chest pain 02/01/2009    Current Outpatient Medications:  .  acetaminophen (TYLENOL) 500 MG tablet, Take 1 tablet (500 mg total) by mouth every 6 (six) hours as needed., Disp: 30 tablet, Rfl: 0 .  albuterol (PROVENTIL HFA;VENTOLIN HFA) 108 (90 Base) MCG/ACT inhaler, Inhale 2 puffs into the lungs every 6 (six) hours as needed for wheezing or shortness of breath., Disp: 18 g, Rfl: 0 .  apixaban (ELIQUIS) 5 MG TABS tablet, Take 1 tablet (5 mg total) by mouth 2 (two) times daily., Disp: 60 tablet, Rfl: 3 .  atorvastatin (LIPITOR) 40 MG tablet, TAKE 1 TABLET(40  MG) BY MOUTH DAILY, Disp: 30 tablet, Rfl: 11 .  baclofen (LIORESAL) 10 MG tablet, TK 1 T PO BID PRN, Disp: , Rfl:  .  carvedilol (COREG) 25 MG tablet, TAKE 1 TABLET(25 MG) BY MOUTH TWICE DAILY WITH A MEAL, Disp: 180 tablet, Rfl: 3 .  furosemide (LASIX) 40 MG tablet, Take 1 tablet (40 mg total) by mouth 2 (two) times daily., Disp: 180 tablet, Rfl: 3 .  gabapentin (NEURONTIN) 100 MG capsule, TK 1 C PO TID, Disp: , Rfl:  .  glucose blood (FREESTYLE TEST STRIPS) test strip, Use as instructed, Disp: 100 each, Rfl: 0 .  glucose monitoring kit (FREESTYLE) monitoring kit, 1 each by Does not apply route 4 (four) times daily - after meals and at bedtime. 1 month Diabetic Testing Supplies for QAC-QHS accuchecks., Disp: 1 each, Rfl: 1 .  HUMALOG KWIKPEN 100 UNIT/ML KwikPen, INJECT 10 UNITS INTO THE SKIN WITH EACH MEAL, Disp: , Rfl:  .  insulin aspart (NOVOLOG FLEXPEN) 100 UNIT/ML FlexPen, 0-20 Units, Subcutaneous, 3 times daily with meals CBG < 70: implement hypoglycemia protocol CBG 70 - 120: 0 units CBG 121 - 150: 3 units CBG 151 - 200: 4 units CBG 201 - 250: 7 units CBG 251 - 300: 11 units CBG 301 - 350: 15 units CBG 351 - 400: 20 units CBG > 400: call MD, Disp: 15 mL, Rfl: 0 .  Insulin Glargine (LANTUS SOLOSTAR) 100 UNIT/ML Solostar Pen, Inject 50 Units into the skin daily at 10 pm., Disp: 15 mL, Rfl: 0 .  Insulin Pen Needle 32G X 8 MM MISC, Use  as directed, Disp: 100 each, Rfl: 0 .  isosorbide-hydrALAZINE (BIDIL) 20-37.5 MG tablet, Take 2 tablets by mouth 3 (three) times daily., Disp: 180 tablet, Rfl: 6 .  Lancets (FREESTYLE) lancets, Use as instructed, Disp: 100 each, Rfl: 0 .  metFORMIN (GLUCOPHAGE-XR) 500 MG 24 hr tablet, TK 2 TS PO BID, Disp: , Rfl:  .  naproxen sodium (ALEVE) 220 MG tablet, Take 220 mg by mouth daily as needed (for pain or headache). , Disp: , Rfl:  .  Oxycodone HCl 10 MG TABS, Take 10 mg by mouth 4 (four) times daily as needed (for pain)., Disp: , Rfl:  .  polyethylene glycol (MIRALAX  / GLYCOLAX) packet, Take 17 g by mouth daily as needed for mild constipation., Disp: 14 each, Rfl: 0 .  potassium chloride SA (KLOR-CON) 20 MEQ tablet, Take 3 tablets (60 mEq total) by mouth daily., Disp: 270 tablet, Rfl: 3 .  sacubitril-valsartan (ENTRESTO) 97-103 MG, Take 1 tablet by mouth 2 (two) times daily., Disp: 60 tablet, Rfl: 11 .  spironolactone (ALDACTONE) 25 MG tablet, TAKE 1 TABLET(25 MG) BY MOUTH DAILY, Disp: 30 tablet, Rfl: 3 .  Tafamidis (VYNDAMAX) 61 MG CAPS, Take 61 mg by mouth daily., Disp: 90 capsule, Rfl: 3 Allergies  Allergen Reactions  . Morphine And Related Nausea Only    Severe nausea  . Penicillins Other (See Comments)    Pt states she has had a pain in her leg since a penicillin injection 2 months ago (reported 08/31/17).  Has tolerated amoxicillin oral.  Has patient had a PCN reaction causing immediate rash, facial/tongue/throat swelling, SOB or lightheadedness with hypotension: No Has patient had a PCN reaction causing severe rash involving mucus membranes or skin necrosis: No Has patient had a PCN reaction that required hospitalization: No Has patient had a PCN reaction occurring within the last 10 years: Yes If a      Social History   Socioeconomic History  . Marital status: Single    Spouse name: Not on file  . Number of children: 4  . Years of education: 40  . Highest education level: Not on file  Occupational History  . Not on file  Tobacco Use  . Smoking status: Former Smoker    Packs/day: 1.00    Years: 40.00    Pack years: 40.00    Types: Cigarettes    Start date: 06/23/1972    Quit date: 03/26/2012    Years since quitting: 7.2  . Smokeless tobacco: Never Used  Substance and Sexual Activity  . Alcohol use: No  . Drug use: No  . Sexual activity: Yes  Other Topics Concern  . Not on file  Social History Narrative  . Not on file   Social Determinants of Health   Financial Resource Strain:   . Difficulty of Paying Living Expenses:    Food Insecurity:   . Worried About Charity fundraiser in the Last Year:   . Arboriculturist in the Last Year:   Transportation Needs:   . Film/video editor (Medical):   Marland Kitchen Lack of Transportation (Non-Medical):   Physical Activity:   . Days of Exercise per Week:   . Minutes of Exercise per Session:   Stress:   . Feeling of Stress :   Social Connections:   . Frequency of Communication with Friends and Family:   . Frequency of Social Gatherings with Friends and Family:   . Attends Religious Services:   . Active Member of Clubs  or Organizations:   . Attends Archivist Meetings:   Marland Kitchen Marital Status:   Intimate Partner Violence:   . Fear of Current or Ex-Partner:   . Emotionally Abused:   Marland Kitchen Physically Abused:   . Sexually Abused:     Physical Exam Cardiovascular:     Rate and Rhythm: Normal rate and regular rhythm.     Pulses: Normal pulses.  Pulmonary:     Effort: Pulmonary effort is normal.     Breath sounds: Normal breath sounds.  Musculoskeletal:        General: Normal range of motion.     Right lower leg: No edema.     Left lower leg: No edema.  Skin:    General: Skin is warm and dry.     Capillary Refill: Capillary refill takes less than 2 seconds.  Neurological:     Mental Status: She is alert and oriented to person, place, and time.  Psychiatric:        Mood and Affect: Mood normal.         Future Appointments  Date Time Provider [place]  08/22/2019 10:20 AM Larey Dresser, MD [facility] None  09/05/2019  8:05 AM [facility] DEVICE REMOTES [facility]  12/05/2019  8:05 AM [facility] DEVICE REMOTES [facility]  01/09/2020 10:00 AM Bernarda Caffey, MD [facility] None    BP (!) 191/84 (BP Location: Left Arm, Patient Position: Sitting, Cuff Size: Normal)   Pulse 94   Resp 16   SpO2 99%   Weight yesterday- did not weigh Last visit weight- 239 lb  Ms Erker was seen at home today and reported feeling well. She  denied chest pain, SOB, headache, dizziness, orthopnea, fever or cough since our last visit. She stated she has been compliant with her medications however she had not taken them prior to my arrival this morning. As a result she was found to be markedly hypertensive. She advised also that her scale was dead so she last weighed herself on May 29 and was 229 lb. I asked that she get batteries for her scale today so she can begin weighing daily again especially since her weight has been so high recently. She was understanding and agreeable. She has a PCP visit tomorrow so she stated she will follow up via phone and let me know how her blood pressure looks with medications on board and give me an accurate weight. Her medications were verified and her pillbox was refilled. I will follow up next week.   [name], EMT 06/28/19  ACTION: Home visit completed Next visit planned for phone call tomorrow

## 2019-07-13 ENCOUNTER — Other Ambulatory Visit (HOSPITAL_COMMUNITY): Payer: Self-pay | Admitting: Cardiology

## 2019-07-13 ENCOUNTER — Other Ambulatory Visit (HOSPITAL_COMMUNITY): Payer: Self-pay

## 2019-07-13 MED ORDER — CARVEDILOL 25 MG PO TABS: ORAL_TABLET | ORAL | 1 refills | Status: DC

## 2019-07-13 NOTE — Progress Notes (Signed)
Ms [name] was seen at home today and reported feeling well. She denied chest pain, SOB, headache, dizziness orthopnea, fever or cough over the past 2 weeks. She stated she has been compliant with her medications however she had missed 5 nights worth of medications over the past week. She has not taken her medications this morning and had not gotten batteries for her scale. She did not have enough medications to fill her pillboxes so I advised her to order the necessary medications and get batteries so I would be able return later this week to finish her pillboxes and obtain vital signs. Ms [name] was understanding and agreeable.   [name], EMT 07/13/19

## 2019-07-14 IMAGING — CR DG CHEST 2V
2 series · 2 of 2 positions shown · non-contrast
Comparison: 02/11/2017

CLINICAL DATA: Cough, congestion

EXAM:
CHEST  2 VIEW

[chest pa]
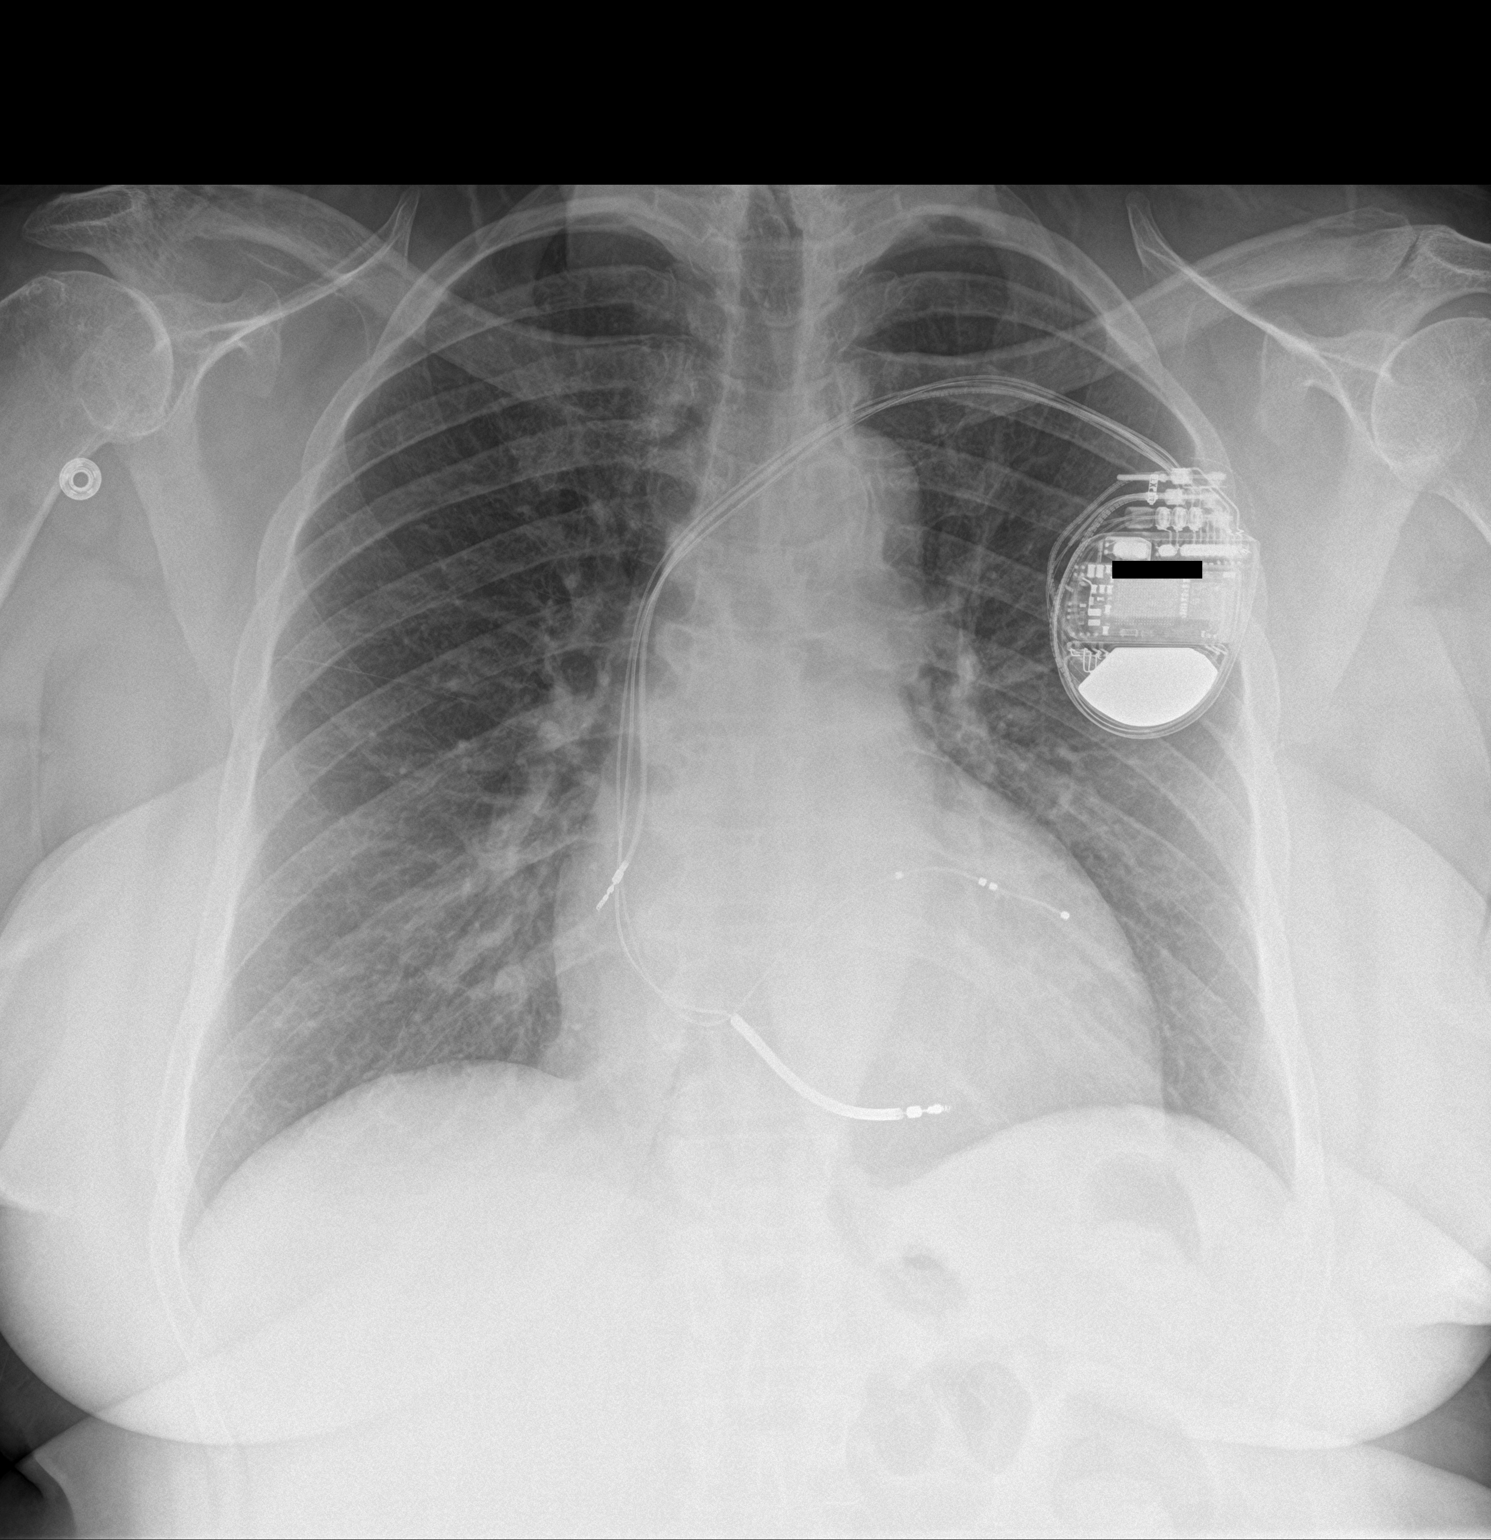

[chest lat]
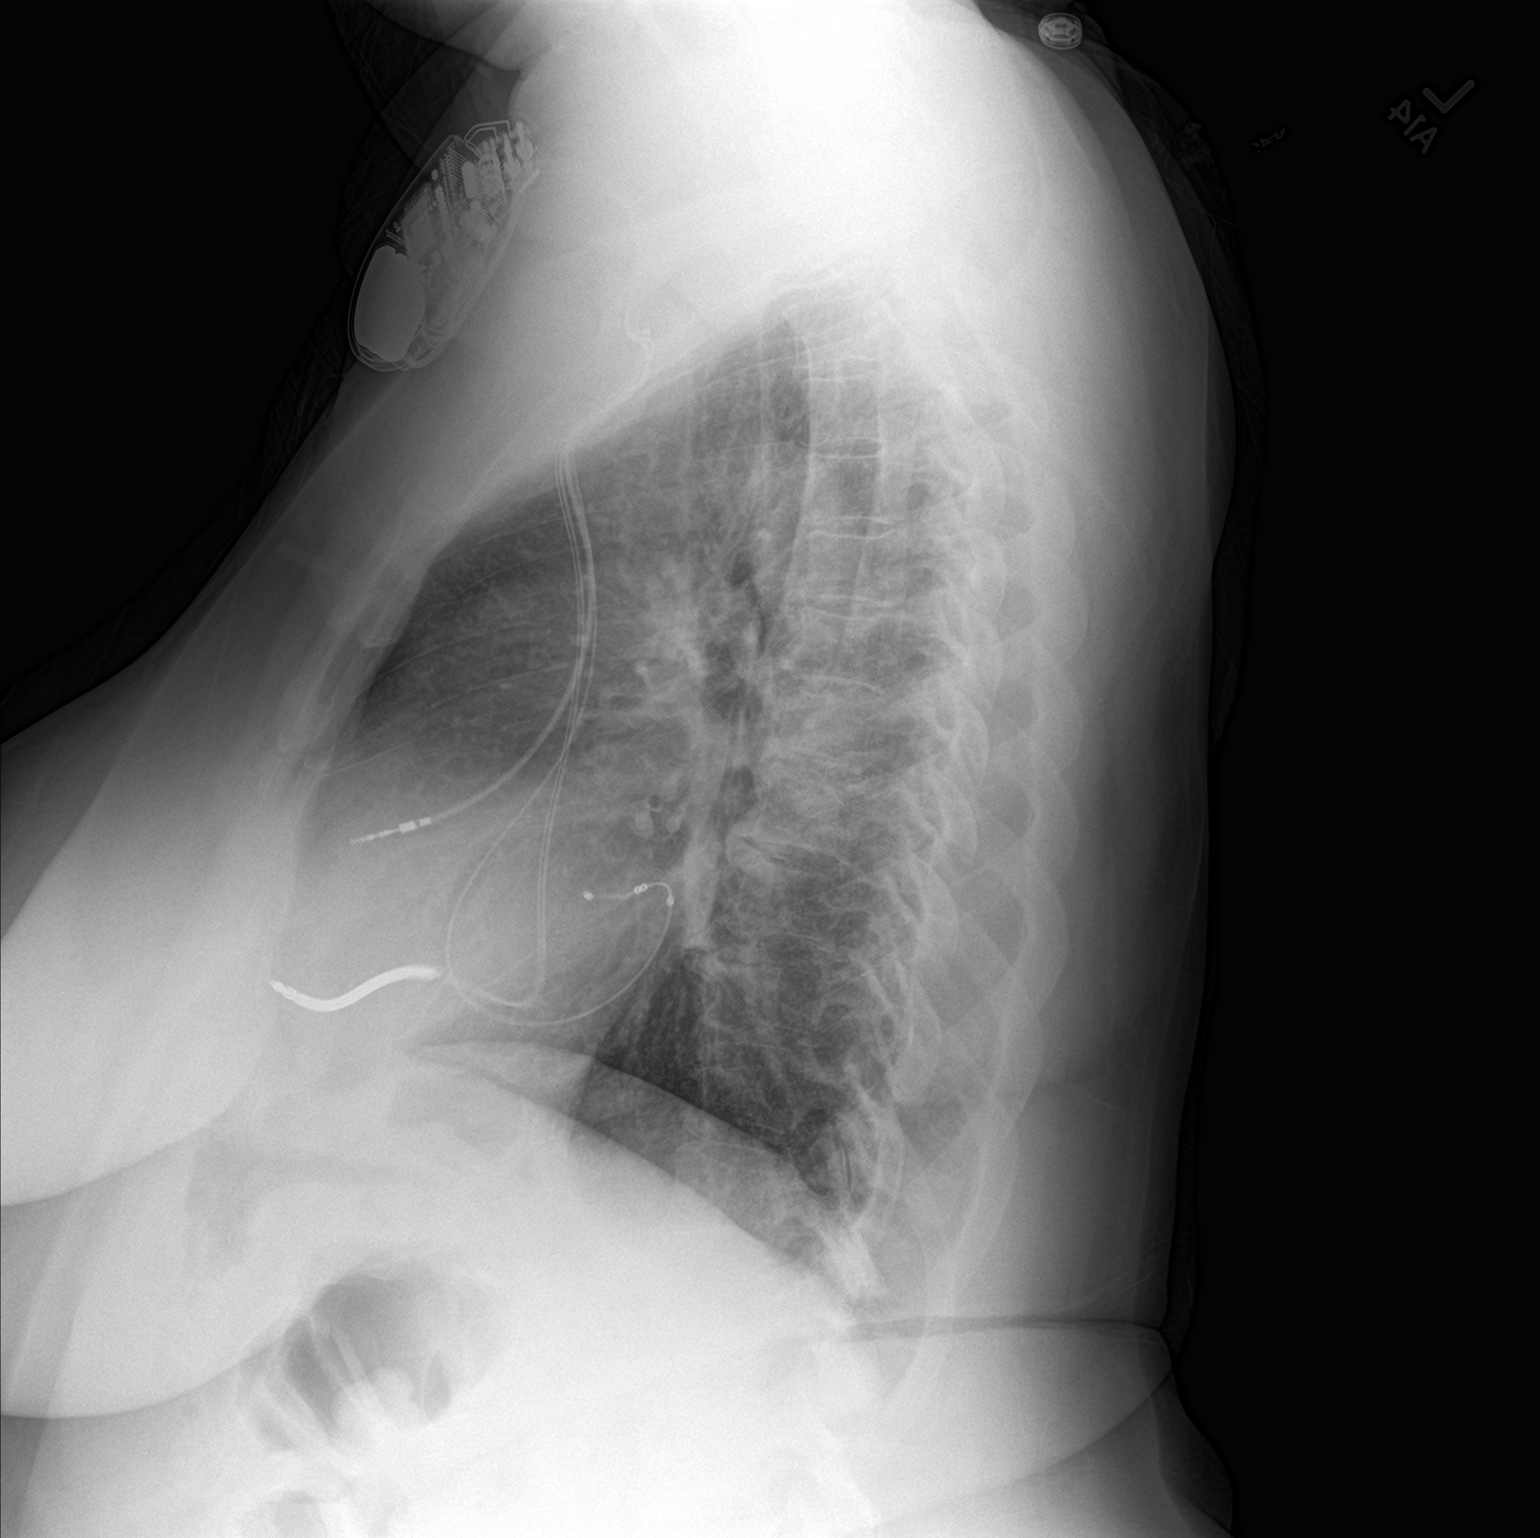

[2 of 2 positions shown; findings below may reference images not displayed]

FINDINGS: Prominent right infrahilar markings, worrisome for mild right lower
lobe pneumonia. Left lung is clear. No pleural effusion or
pneumothorax.

Mild cardiomegaly.  Left subclavian ICD.
IMPRESSION: Prominent right infrahilar markings, worrisome for mild right lower
lobe pneumonia.

## 2019-07-19 ENCOUNTER — Telehealth (HOSPITAL_COMMUNITY): Payer: Self-pay

## 2019-07-19 NOTE — Telephone Encounter (Signed)
I called [name] to schedule an appointment. She stated she would be available first thing tomorrow morning but has not picked up her necessary medications yet because she did not get a ride last week. She asked if I would be able to pick them up in the morning before our visit and I agreed. I will see her at 08:30 tomorrow.   [name], EMT 07/19/19

## 2019-07-20 ENCOUNTER — Other Ambulatory Visit (HOSPITAL_COMMUNITY): Payer: Self-pay

## 2019-07-20 ENCOUNTER — Other Ambulatory Visit (HOSPITAL_COMMUNITY): Payer: Self-pay | Admitting: *Deleted

## 2019-07-20 MED ORDER — ISOSORB DINITRATE-HYDRALAZINE 20-37.5 MG PO TABS: 2.0000 | ORAL_TABLET | Freq: Three times a day (TID) | ORAL | 6 refills | Status: DC

## 2019-07-20 NOTE — Progress Notes (Signed)
Paramedicine Encounter    Patient ID: [name], female    DOB: 1950/03/29, 69 y.o.   MRN: [id number]   Patient Care Team: System, Pcp Not In as PCP - General [place], Connye Burkitt, LCSW as Social Worker (Licensed Holiday representative)  Patient Active Problem List   Diagnosis Date Noted  . Right foot infection 10/22/2017  . Cellulitis in diabetic foot ([place]) 10/22/2017  . Hyperglycemia 10/22/2017  . Lumbar radiculopathy 09/18/2017  . Degenerative spondylolisthesis 09/18/2017  . Atrial fibrillation ([place]) 02/11/2017  . Paroxysmal A-fib ([place])   . Biventricular automatic implantable cardioverter defibrillator in situ   . Sepsis ([place]) 04/20/2016  . UTI (urinary tract infection) 04/20/2016  . Dyspnea 07/19/2015  . Interstitial lung disease ([place]) 11/20/2014  . SIRS (systemic inflammatory response syndrome) ([place]) 02/03/2014  . IDDM (insulin dependent diabetes mellitus) 02/03/2014  . Tachycardia 06/14/2013  . Chronic systolic CHF (congestive heart failure) ([place]) 09/29/2012  . Hypoxia 03/06/2012  . Hypertensive heart disease 03/06/2012  . Nonischemic cardiomyopathy ([place]) 03/06/2012  . Tobacco abuse 03/06/2012  . Type 2 diabetes mellitus ([place]) 03/06/2012  . Hypertension 03/06/2012  . CAD (coronary artery disease), native coronary artery 03/06/2012  . Hypokalemia 03/06/2012  . Acute bronchitis 02/01/2009  . Sleep apnea 02/01/2009  . Chest pain 02/01/2009    Current Outpatient Medications:  .  apixaban (ELIQUIS) 5 MG TABS tablet, Take 1 tablet (5 mg total) by mouth 2 (two) times daily., Disp: 60 tablet, Rfl: 3 .  atorvastatin (LIPITOR) 40 MG tablet, TAKE 1 TABLET(40 MG) BY MOUTH DAILY, Disp: 30 tablet, Rfl: 11 .  carvedilol (COREG) 25 MG tablet, TAKE 1 TABLET(25 MG) BY MOUTH TWICE DAILY WITH A MEAL, Disp: 180 tablet, Rfl: 1 .  furosemide (LASIX) 40 MG tablet, Take 1 tablet (40 mg total) by mouth 2 (two) times daily., Disp: 180 tablet, Rfl: 3 .  gabapentin (NEURONTIN) 100 MG capsule, TK 1  C PO TID, Disp: , Rfl:  .  HUMALOG KWIKPEN 100 UNIT/ML KwikPen, INJECT 10 UNITS INTO THE SKIN WITH EACH MEAL, Disp: , Rfl:  .  insulin aspart (NOVOLOG FLEXPEN) 100 UNIT/ML FlexPen, 0-20 Units, Subcutaneous, 3 times daily with meals CBG < 70: implement hypoglycemia protocol CBG 70 - 120: 0 units CBG 121 - 150: 3 units CBG 151 - 200: 4 units CBG 201 - 250: 7 units CBG 251 - 300: 11 units CBG 301 - 350: 15 units CBG 351 - 400: 20 units CBG > 400: call MD, Disp: 15 mL, Rfl: 0 .  Oxycodone HCl 10 MG TABS, Take 10 mg by mouth 4 (four) times daily as needed (for pain)., Disp: , Rfl:  .  potassium chloride SA (KLOR-CON) 20 MEQ tablet, Take 3 tablets (60 mEq total) by mouth daily., Disp: 270 tablet, Rfl: 3 .  sacubitril-valsartan (ENTRESTO) 97-103 MG, Take 1 tablet by mouth 2 (two) times daily., Disp: 60 tablet, Rfl: 11 .  spironolactone (ALDACTONE) 25 MG tablet, TAKE 1 TABLET(25 MG) BY MOUTH DAILY, Disp: 30 tablet, Rfl: 3 .  Tafamidis (VYNDAMAX) 61 MG CAPS, Take 61 mg by mouth daily., Disp: 90 capsule, Rfl: 3 .  acetaminophen (TYLENOL) 500 MG tablet, Take 1 tablet (500 mg total) by mouth every 6 (six) hours as needed., Disp: 30 tablet, Rfl: 0 .  albuterol (PROVENTIL HFA;VENTOLIN HFA) 108 (90 Base) MCG/ACT inhaler, Inhale 2 puffs into the lungs every 6 (six) hours as needed for wheezing or shortness of breath., Disp: 18 g, Rfl: 0 .  baclofen (LIORESAL)  10 MG tablet, TK 1 T PO BID PRN (Patient not taking: Reported on 07/13/2019), Disp: , Rfl:  .  glucose blood (FREESTYLE TEST STRIPS) test strip, Use as instructed, Disp: 100 each, Rfl: 0 .  glucose monitoring kit (FREESTYLE) monitoring kit, 1 each by Does not apply route 4 (four) times daily - after meals and at bedtime. 1 month Diabetic Testing Supplies for QAC-QHS accuchecks., Disp: 1 each, Rfl: 1 .  Insulin Glargine (LANTUS SOLOSTAR) 100 UNIT/ML Solostar Pen, Inject 50 Units into the skin daily at 10 pm., Disp: 15 mL, Rfl: 0 .  Insulin Pen Needle 32G X 8 MM  MISC, Use as directed, Disp: 100 each, Rfl: 0 .  isosorbide-hydrALAZINE (BIDIL) 20-37.5 MG tablet, Take 2 tablets by mouth 3 (three) times daily., Disp: 180 tablet, Rfl: 6 .  Lancets (FREESTYLE) lancets, Use as instructed, Disp: 100 each, Rfl: 0 .  metFORMIN (GLUCOPHAGE-XR) 500 MG 24 hr tablet, TK 2 TS PO BID (Patient not taking: Reported on 07/13/2019), Disp: , Rfl:  .  naproxen sodium (ALEVE) 220 MG tablet, Take 220 mg by mouth daily as needed (for pain or headache).  (Patient not taking: Reported on 07/13/2019), Disp: , Rfl:  .  polyethylene glycol (MIRALAX / GLYCOLAX) packet, Take 17 g by mouth daily as needed for mild constipation., Disp: 14 each, Rfl: 0 Allergies  Allergen Reactions  . Morphine And Related Nausea Only    Severe nausea  . Penicillins Other (See Comments)    Pt states she has had a pain in her leg since a penicillin injection 2 months ago (reported 08/31/17).  Has tolerated amoxicillin oral.  Has patient had a PCN reaction causing immediate rash, facial/tongue/throat swelling, SOB or lightheadedness with hypotension: No Has patient had a PCN reaction causing severe rash involving mucus membranes or skin necrosis: No Has patient had a PCN reaction that required hospitalization: No Has patient had a PCN reaction occurring within the last 10 years: Yes If a      Social History   Socioeconomic History  . Marital status: Single    Spouse name: Not on file  . Number of children: 4  . Years of education: 36  . Highest education level: Not on file  Occupational History  . Not on file  Tobacco Use  . Smoking status: Former Smoker    Packs/day: 1.00    Years: 40.00    Pack years: 40.00    Types: Cigarettes    Start date: 06/23/1972    Quit date: 03/26/2012    Years since quitting: 7.3  . Smokeless tobacco: Never Used  Vaping Use  . Vaping Use: Never assessed  Substance and Sexual Activity  . Alcohol use: No  . Drug use: No  . Sexual activity: Yes  Other Topics  Concern  . Not on file  Social History Narrative  . Not on file   Social Determinants of Health   Financial Resource Strain:   . Difficulty of Paying Living Expenses:   Food Insecurity:   . Worried About Charity fundraiser in the Last Year:   . Arboriculturist in the Last Year:   Transportation Needs:   . Film/video editor (Medical):   Marland Kitchen Lack of Transportation (Non-Medical):   Physical Activity:   . Days of Exercise per Week:   . Minutes of Exercise per Session:   Stress:   . Feeling of Stress :   Social Connections:   . Frequency of Communication with  Friends and Family:   . Frequency of Social Gatherings with Friends and Family:   . Attends Religious Services:   . Active Member of Clubs or Organizations:   . Attends Archivist Meetings:   Marland Kitchen Marital Status:   Intimate Partner Violence:   . Fear of Current or Ex-Partner:   . Emotionally Abused:   Marland Kitchen Physically Abused:   . Sexually Abused:     Physical Exam Cardiovascular:     Rate and Rhythm: Normal rate and regular rhythm.     Pulses: Normal pulses.  Pulmonary:     Effort: Pulmonary effort is normal.     Breath sounds: Normal breath sounds.  Musculoskeletal:        General: Normal range of motion.     Right lower leg: Edema present.     Left lower leg: Edema present.  Skin:    General: Skin is warm and dry.     Capillary Refill: Capillary refill takes less than 2 seconds.  Neurological:     Mental Status: She is alert and oriented to person, place, and time.  Psychiatric:        Mood and Affect: Mood normal.         Future Appointments  Date Time Provider [place]  08/22/2019 10:20 AM Larey Dresser, MD [facility] None  09/05/2019  8:05 AM [facility] DEVICE REMOTES [facility]  12/05/2019  8:05 AM [facility] DEVICE REMOTES [facility]  01/09/2020 10:00 AM Bernarda Caffey, MD [facility] None    BP (!) 187/96 (BP Location: Left Arm, Patient Position: Sitting,  Cuff Size: Large)   Pulse 88   Resp 16   Wt 237 lb (107.5 kg)   SpO2 98%   BMI 37.12 kg/m   Weight yesterday- did not weigh Last visit weight- did not weigh  Ms Nyquist was seen at home today and reported feeling well. She denied chest pain, SOB, headache, dizziness, orthopnea, fever or cough over the past week. She stated she has been compliant with her medications over th past week but her weight is up to 237 lb. She has not been weighing daily but was able to obtain scale batteries that I installed today so she will be weighing daily moving forward. Her medications were verified and her pillbox was refilled. I will follow up next week.   [name], EMT 07/20/19  ACTION: Home visit completed Next visit planned for 1 week

## 2019-07-26 ENCOUNTER — Other Ambulatory Visit (HOSPITAL_COMMUNITY): Payer: Self-pay

## 2019-07-26 NOTE — Progress Notes (Signed)
Paramedicine Encounter    Patient ID: [name], female    DOB: 09-Feb-1950, 69 y.o.   MRN: [id number]   Patient Care Team: System, Pcp Not In as PCP - [facility] [place], Connye Burkitt, LCSW as Social Worker (Licensed Holiday representative)  Patient Active Problem List   Diagnosis Date Noted  . Right foot infection 10/22/2017  . Cellulitis in diabetic foot ([place]) 10/22/2017  . Hyperglycemia 10/22/2017  . Lumbar radiculopathy 09/18/2017  . Degenerative spondylolisthesis 09/18/2017  . Atrial fibrillation ([place]) 02/11/2017  . Paroxysmal A-fib ([place])   . Biventricular automatic implantable cardioverter defibrillator in situ   . Sepsis ([place]) 04/20/2016  . UTI (urinary tract infection) 04/20/2016  . Dyspnea 07/19/2015  . Interstitial lung disease ([place]) 11/20/2014  . SIRS (systemic inflammatory response syndrome) ([place]) 02/03/2014  . IDDM (insulin dependent diabetes mellitus) 02/03/2014  . Tachycardia 06/14/2013  . Chronic systolic CHF (congestive heart failure) ([place]) 09/29/2012  . Hypoxia 03/06/2012  . Hypertensive heart disease 03/06/2012  . Nonischemic cardiomyopathy ([place]) 03/06/2012  . Tobacco abuse 03/06/2012  . Type 2 diabetes mellitus ([place]) 03/06/2012  . Hypertension 03/06/2012  . CAD (coronary artery disease), native coronary artery 03/06/2012  . Hypokalemia 03/06/2012  . Acute bronchitis 02/01/2009  . Sleep apnea 02/01/2009  . Chest pain 02/01/2009    Current Outpatient Medications:  .  acetaminophen (TYLENOL) 500 MG tablet, Take 1 tablet (500 mg total) by mouth every 6 (six) hours as needed., Disp: 30 tablet, Rfl: 0 .  albuterol (PROVENTIL HFA;VENTOLIN HFA) 108 (90 Base) MCG/ACT inhaler, Inhale 2 puffs into the lungs every 6 (six) hours as needed for wheezing or shortness of breath., Disp: 18 g, Rfl: 0 .  apixaban (ELIQUIS) 5 MG TABS tablet, Take 1 tablet (5 mg total) by mouth 2 (two) times daily., Disp: 60 tablet, Rfl: 3 .  atorvastatin (LIPITOR) 40 MG tablet, TAKE 1 TABLET(40  MG) BY MOUTH DAILY, Disp: 30 tablet, Rfl: 11 .  baclofen (LIORESAL) 10 MG tablet, TK 1 T PO BID PRN (Patient not taking: Reported on 07/13/2019), Disp: , Rfl:  .  carvedilol (COREG) 25 MG tablet, TAKE 1 TABLET(25 MG) BY MOUTH TWICE DAILY WITH A MEAL, Disp: 180 tablet, Rfl: 1 .  furosemide (LASIX) 40 MG tablet, Take 1 tablet (40 mg total) by mouth 2 (two) times daily., Disp: 180 tablet, Rfl: 3 .  gabapentin (NEURONTIN) 100 MG capsule, TK 1 C PO TID, Disp: , Rfl:  .  glucose blood (FREESTYLE TEST STRIPS) test strip, Use as instructed, Disp: 100 each, Rfl: 0 .  glucose monitoring kit (FREESTYLE) monitoring kit, 1 each by Does not apply route 4 (four) times daily - after meals and at bedtime. 1 month Diabetic Testing Supplies for QAC-QHS accuchecks., Disp: 1 each, Rfl: 1 .  HUMALOG KWIKPEN 100 UNIT/ML KwikPen, INJECT 10 UNITS INTO THE SKIN WITH EACH MEAL, Disp: , Rfl:  .  insulin aspart (NOVOLOG FLEXPEN) 100 UNIT/ML FlexPen, 0-20 Units, Subcutaneous, 3 times daily with meals CBG < 70: implement hypoglycemia protocol CBG 70 - 120: 0 units CBG 121 - 150: 3 units CBG 151 - 200: 4 units CBG 201 - 250: 7 units CBG 251 - 300: 11 units CBG 301 - 350: 15 units CBG 351 - 400: 20 units CBG > 400: call MD, Disp: 15 mL, Rfl: 0 .  Insulin Glargine (LANTUS SOLOSTAR) 100 UNIT/ML Solostar Pen, Inject 50 Units into the skin daily at 10 pm., Disp: 15 mL, Rfl: 0 .  Insulin Pen Needle  32G X 8 MM MISC, Use as directed, Disp: 100 each, Rfl: 0 .  isosorbide-hydrALAZINE (BIDIL) 20-37.5 MG tablet, Take 2 tablets by mouth 3 (three) times daily., Disp: 180 tablet, Rfl: 6 .  Lancets (FREESTYLE) lancets, Use as instructed, Disp: 100 each, Rfl: 0 .  metFORMIN (GLUCOPHAGE-XR) 500 MG 24 hr tablet, TK 2 TS PO BID (Patient not taking: Reported on 07/13/2019), Disp: , Rfl:  .  naproxen sodium (ALEVE) 220 MG tablet, Take 220 mg by mouth daily as needed (for pain or headache).  (Patient not taking: Reported on 07/13/2019), Disp: , Rfl:  .   Oxycodone HCl 10 MG TABS, Take 10 mg by mouth 4 (four) times daily as needed (for pain)., Disp: , Rfl:  .  polyethylene glycol (MIRALAX / GLYCOLAX) packet, Take 17 g by mouth daily as needed for mild constipation., Disp: 14 each, Rfl: 0 .  potassium chloride SA (KLOR-CON) 20 MEQ tablet, Take 3 tablets (60 mEq total) by mouth daily., Disp: 270 tablet, Rfl: 3 .  sacubitril-valsartan (ENTRESTO) 97-103 MG, Take 1 tablet by mouth 2 (two) times daily., Disp: 60 tablet, Rfl: 11 .  spironolactone (ALDACTONE) 25 MG tablet, TAKE 1 TABLET(25 MG) BY MOUTH DAILY, Disp: 30 tablet, Rfl: 3 .  Tafamidis (VYNDAMAX) 61 MG CAPS, Take 61 mg by mouth daily., Disp: 90 capsule, Rfl: 3 Allergies  Allergen Reactions  . Morphine And Related Nausea Only    Severe nausea  . Penicillins Other (See Comments)    Pt states she has had a pain in her leg since a penicillin injection 2 months ago (reported 08/31/17).  Has tolerated amoxicillin oral.  Has patient had a PCN reaction causing immediate rash, facial/tongue/throat swelling, SOB or lightheadedness with hypotension: No Has patient had a PCN reaction causing severe rash involving mucus membranes or skin necrosis: No Has patient had a PCN reaction that required hospitalization: No Has patient had a PCN reaction occurring within the last 10 years: Yes If a      Social History   Socioeconomic History  . Marital status: Single    Spouse name: Not on file  . Number of children: 4  . Years of education: 59  . Highest education level: Not on file  Occupational History  . Not on file  Tobacco Use  . Smoking status: Former Smoker    Packs/day: 1.00    Years: 40.00    Pack years: 40.00    Types: Cigarettes    Start date: 06/23/1972    Quit date: 03/26/2012    Years since quitting: 7.3  . Smokeless tobacco: Never Used  Vaping Use  . Vaping Use: Never assessed  Substance and Sexual Activity  . Alcohol use: No  . Drug use: No  . Sexual activity: Yes  Other Topics  Concern  . Not on file  Social History Narrative  . Not on file   Social Determinants of Health   Financial Resource Strain:   . Difficulty of Paying Living Expenses:   Food Insecurity:   . Worried About Charity fundraiser in the Last Year:   . Arboriculturist in the Last Year:   Transportation Needs:   . Film/video editor (Medical):   Marland Kitchen Lack of Transportation (Non-Medical):   Physical Activity:   . Days of Exercise per Week:   . Minutes of Exercise per Session:   Stress:   . Feeling of Stress :   Social Connections:   . Frequency of Communication with  Friends and Family:   . Frequency of Social Gatherings with Friends and Family:   . Attends Religious Services:   . Active Member of Clubs or Organizations:   . Attends Archivist Meetings:   Marland Kitchen Marital Status:   Intimate Partner Violence:   . Fear of Current or Ex-Partner:   . Emotionally Abused:   Marland Kitchen Physically Abused:   . Sexually Abused:     Physical Exam Cardiovascular:     Rate and Rhythm: Normal rate and regular rhythm.     Pulses: Normal pulses.  Pulmonary:     Effort: Pulmonary effort is normal.     Breath sounds: Normal breath sounds.  Musculoskeletal:        General: Normal range of motion.     Right lower leg: No edema.     Left lower leg: No edema.  Skin:    General: Skin is warm and dry.     Capillary Refill: Capillary refill takes less than 2 seconds.  Neurological:     Mental Status: She is alert and oriented to person, place, and time.  Psychiatric:        Mood and Affect: Mood normal.         Future Appointments  Date Time Provider [place]  08/22/2019 10:20 AM Larey Dresser, MD [facility] None  09/05/2019  8:05 AM [facility] DEVICE REMOTES [facility]  12/05/2019  8:05 AM [facility] DEVICE REMOTES [facility]  01/09/2020 10:00 AM Bernarda Caffey, MD [facility] None    BP 133/68 (BP Location: Left Arm, Patient Position: Sitting, Cuff Size:  Normal)   Pulse 94   Resp 16   Wt 234 lb (106.1 kg)   SpO2 98%   BMI 36.65 kg/m   Weight yesterday- did not weigh Last visit weight- 237 lb  Mr Schemm was seen at home today and reported feeling well. She denied chest pain, SOB, headache, dizziness, orthopnea, fever or cough over the past week. She stated she has been compliant with her medications over the past week and her weight has been stable. Her medications were verified and her pillbox was refilled. I will follow up next week.   [name], EMT 07/26/19  ACTION: Home visit completed Next visit planned for 1 week

## 2019-07-27 ENCOUNTER — Emergency Department (HOSPITAL_COMMUNITY): Payer: Medicare Other

## 2019-07-27 ENCOUNTER — Other Ambulatory Visit: Payer: Self-pay

## 2019-07-27 ENCOUNTER — Encounter (HOSPITAL_COMMUNITY): Payer: Self-pay | Admitting: Pediatrics

## 2019-07-27 ENCOUNTER — Emergency Department (HOSPITAL_COMMUNITY)
Admission: EM | Admit: 2019-07-27 | Discharge: 2019-07-27 | Disposition: A | Discharge status: Home / Self Care | Payer: Medicare Other | Attending: Emergency Medicine | Admitting: Emergency Medicine

## 2019-07-27 DIAGNOSIS — Z79899 Other long term (current) drug therapy: Secondary | ICD-10-CM | POA: Diagnosis not present

## 2019-07-27 DIAGNOSIS — I2510 Atherosclerotic heart disease of native coronary artery without angina pectoris: Secondary | ICD-10-CM | POA: Insufficient documentation

## 2019-07-27 DIAGNOSIS — Z7901 Long term (current) use of anticoagulants: Secondary | ICD-10-CM | POA: Diagnosis not present

## 2019-07-27 DIAGNOSIS — Z87891 Personal history of nicotine dependence: Secondary | ICD-10-CM | POA: Insufficient documentation

## 2019-07-27 DIAGNOSIS — R059 Cough, unspecified: Secondary | ICD-10-CM

## 2019-07-27 DIAGNOSIS — R519 Headache, unspecified: Secondary | ICD-10-CM | POA: Diagnosis present

## 2019-07-27 DIAGNOSIS — I110 Hypertensive heart disease with heart failure: Secondary | ICD-10-CM | POA: Insufficient documentation

## 2019-07-27 DIAGNOSIS — I5042 Chronic combined systolic (congestive) and diastolic (congestive) heart failure: Secondary | ICD-10-CM | POA: Diagnosis not present

## 2019-07-27 DIAGNOSIS — Z794 Long term (current) use of insulin: Secondary | ICD-10-CM | POA: Diagnosis not present

## 2019-07-27 DIAGNOSIS — E1165 Type 2 diabetes mellitus with hyperglycemia: Secondary | ICD-10-CM | POA: Diagnosis not present

## 2019-07-27 DIAGNOSIS — R739 Hyperglycemia, unspecified: Secondary | ICD-10-CM

## 2019-07-27 DIAGNOSIS — R05 Cough: Secondary | ICD-10-CM | POA: Diagnosis not present

## 2019-07-27 LAB — CBC WITH DIFFERENTIAL/PLATELET
Abs Immature Granulocytes: 0.02 10*3/uL (ref 0.00–0.07)
Basophils Absolute: 0.1 10*3/uL (ref 0.0–0.1)
Basophils Relative: 1 %
Eosinophils Absolute: 0.3 10*3/uL (ref 0.0–0.5)
Eosinophils Relative: 3 %
HCT: 36.1 % (ref 36.0–46.0)
Hemoglobin: 11.8 g/dL — ABNORMAL LOW (ref 12.0–15.0)
Immature Granulocytes: 0 %
Lymphocytes Relative: 19 %
Lymphs Abs: 1.4 10*3/uL (ref 0.7–4.0)
MCH: 30.6 pg (ref 26.0–34.0)
MCHC: 32.7 g/dL (ref 30.0–36.0)
MCV: 93.8 fL (ref 80.0–100.0)
Monocytes Absolute: 0.7 10*3/uL (ref 0.1–1.0)
Monocytes Relative: 9 %
Neutro Abs: 5.2 10*3/uL (ref 1.7–7.7)
Neutrophils Relative %: 68 %
Platelets: 335 10*3/uL (ref 150–400)
RBC: 3.85 MIL/uL — ABNORMAL LOW (ref 3.87–5.11)
RDW: 12.5 % (ref 11.5–15.5)
WBC: 7.6 10*3/uL (ref 4.0–10.5)
nRBC: 0 % (ref 0.0–0.2)

## 2019-07-27 LAB — COMPREHENSIVE METABOLIC PANEL
ALT: 13 U/L (ref 0–44)
AST: 16 U/L (ref 15–41)
Albumin: 3.6 g/dL (ref 3.5–5.0)
Alkaline Phosphatase: 89 U/L (ref 38–126)
Anion gap: 9 (ref 5–15)
BUN: 23 mg/dL (ref 8–23)
CO2: 26 mmol/L (ref 22–32)
Calcium: 9 mg/dL (ref 8.9–10.3)
Chloride: 99 mmol/L (ref 98–111)
Creatinine, Ser: 1.19 mg/dL — ABNORMAL HIGH (ref 0.44–1.00)
GFR calc Af Amer: 55 mL/min — ABNORMAL LOW (ref >60)
GFR calc non Af Amer: 48 mL/min — ABNORMAL LOW (ref >60)
Glucose, Bld: 395 mg/dL — ABNORMAL HIGH (ref 70–99)
Potassium: 4.4 mmol/L (ref 3.5–5.1)
Sodium: 134 mmol/L — ABNORMAL LOW (ref 135–145)
Total Bilirubin: 0.5 mg/dL (ref 0.3–1.2)
Total Protein: 7 g/dL (ref 6.5–8.1)

## 2019-07-27 LAB — CBG MONITORING, ED
Glucose-Capillary: 310 mg/dL — ABNORMAL HIGH (ref 70–99)
Glucose-Capillary: 250 mg/dL — ABNORMAL HIGH (ref 70–99)

## 2019-07-27 LAB — TROPONIN I (HIGH SENSITIVITY)
Troponin I (High Sensitivity): 11 ng/L (ref <18)
Troponin I (High Sensitivity): 10 ng/L (ref <18)

## 2019-07-27 MED ORDER — PROCHLORPERAZINE EDISYLATE 10 MG/2ML IJ SOLN
10.0000 mg | Freq: Once | INTRAMUSCULAR | Status: AC
  Administered 2019-07-27: 10 mg via INTRAVENOUS
  Filled 2019-07-27: qty 2

## 2019-07-27 MED ORDER — SODIUM CHLORIDE 0.9 % IV BOLUS
1000.0000 mL | Freq: Once | INTRAVENOUS | Status: AC
  Administered 2019-07-27: 1000 mL via INTRAVENOUS

## 2019-07-27 MED ORDER — INSULIN ASPART 100 UNIT/ML ~~LOC~~ SOLN
15.0000 [IU] | Freq: Once | SUBCUTANEOUS | Status: AC
  Administered 2019-07-27: 15 [IU] via SUBCUTANEOUS

## 2019-07-27 NOTE — ED Notes (Signed)
Pt's Sp02 100% after ambulating to room from waiting room

## 2019-07-27 NOTE — ED Notes (Addendum)
Pt's CBG result was 310. Informed Emmy - RN. Pt also asked for water. Lowella Petties - RN aware.

## 2019-07-27 NOTE — Discharge Instructions (Addendum)
The testing today showed only high blood sugar.  Make sure you are taking your medicine as directed and staying on low carbohydrate diet.  You may be a little bit dehydrated so try to drink more water every day to keep yourself well-hydrated.  Follow-up with your doctor if you are not better in a few days.

## 2019-07-27 NOTE — ED Provider Notes (Signed)
[facility] EMERGENCY DEPARTMENT Provider Note   CSN: [id number] Arrival date & time: 07/27/19  1252     History Chief Complaint  Patient presents with  . Cough  . Shortness of Breath    [name] is a 69 y.o. female.  HPI She presents for evaluation of cough, nonproductive for 3 days.  She also complains of "a headache."  She did not take her insulin this morning.  She denies anorexia or vomiting.  She does not know if she has had a fever.  She states that she has had 2 Covid vaccines.  She does not know of any sick contacts.  There are no other known modifying factors.    Past Medical History:  Diagnosis Date  . Anemia    a. Noted on 07/2012 labs, instructed to f/u PCP.  Marland Kitchen Arthritis    "joints" (11/18/2012)  . CAD (coronary artery disease), native coronary artery    a. Nonobstructive by cath 02/2012 (done because of low EF).  . Chronic bronchitis ([place])    "~ every other year" (11/18/2012)  . Chronic combined systolic and diastolic CHF (congestive heart failure) ([place])    a. 03/05/12 echo:  LVEF 20-25%, moderate LVH , inferior and basal to mid septal akinesis, anterior moderate to severe hypokinesis and grade 2 diastolic dysfunction. b. EF 07/2012: EF still 25% (unclear medication compliance).  . Chronic lower back pain   . Headache(784.0)    "often; maybe not daily" (11/18/2012)  . High cholesterol   . History of noncompliance with medical treatment   . Hypertension   . LBBB (left bundle branch block)   . Orthopnea   . Tobacco abuse   . Type II diabetes mellitus [facility])     Patient Active Problem List   Diagnosis Date Noted  . Right foot infection 10/22/2017  . Cellulitis in diabetic foot ([place]) 10/22/2017  . Hyperglycemia 10/22/2017  . Lumbar radiculopathy 09/18/2017  . Degenerative spondylolisthesis 09/18/2017  . Atrial fibrillation ([place]) 02/11/2017  . Paroxysmal A-fib ([place])   . Biventricular automatic implantable cardioverter defibrillator in  situ   . Sepsis ([place]) 04/20/2016  . UTI (urinary tract infection) 04/20/2016  . Dyspnea 07/19/2015  . Interstitial lung disease ([place]) 11/20/2014  . SIRS (systemic inflammatory response syndrome) ([place]) 02/03/2014  . IDDM (insulin dependent diabetes mellitus) 02/03/2014  . Tachycardia 06/14/2013  . Chronic systolic CHF (congestive heart failure) ([place]) 09/29/2012  . Hypoxia 03/06/2012  . Hypertensive heart disease 03/06/2012  . Nonischemic cardiomyopathy ([place]) 03/06/2012  . Tobacco abuse 03/06/2012  . Type 2 diabetes mellitus ([place]) 03/06/2012  . Hypertension 03/06/2012  . CAD (coronary artery disease), native coronary artery 03/06/2012  . Hypokalemia 03/06/2012  . Acute bronchitis 02/01/2009  . Sleep apnea 02/01/2009  . Chest pain 02/01/2009    Past Surgical History:  Procedure Laterality Date  . BI-VENTRICULAR IMPLANTABLE CARDIOVERTER DEFIBRILLATOR N/A 11/18/2012   Procedure: BI-VENTRICULAR IMPLANTABLE CARDIOVERTER DEFIBRILLATOR  (CRT-D);  Surgeon: Evans Lance, MD;  Location: [facility] CATH LAB;  Service: Cardiovascular;  Laterality: N/A;  . BI-VENTRICULAR IMPLANTABLE CARDIOVERTER DEFIBRILLATOR  (CRT-D)  11/18/2012  . CARDIAC CATHETERIZATION  03/04/12   nonobstructive CAD, elevated LVEDP and tortuous vessels suggestive of long-standing hypertension  . COLONOSCOPY WITH PROPOFOL N/A 12/12/2016   Procedure: COLONOSCOPY WITH PROPOFOL;  Surgeon: Carol Ada, MD;  Location: [facility] ENDOSCOPY;  Service: Endoscopy;  Laterality: N/A;  . JOINT REPLACEMENT     Bilateral hip and right knee  . LEFT HEART CATH N/A 03/05/2012  Procedure: LEFT HEART CATH;  Surgeon: Larey Dresser, MD;  Location: [facility] CATH LAB;  Service: Cardiovascular;  Laterality: N/A;     OB History   No obstetric history on file.     Family History  Problem Relation Age of Onset  . Heart disease Neg Hx     Social History   Tobacco Use  . Smoking status: Former Smoker    Packs/day: 1.00    Years: 40.00    Pack years: 40.00      Types: Cigarettes    Start date: 06/23/1972    Quit date: 03/26/2012    Years since quitting: 7.3  . Smokeless tobacco: Never Used  Vaping Use  . Vaping Use: Never assessed  Substance Use Topics  . Alcohol use: No  . Drug use: No    Home Medications Prior to Admission medications   Medication Sig Start Date End Date Taking? Authorizing Provider  acetaminophen (TYLENOL) 500 MG tablet Take 1 tablet (500 mg total) by mouth every 6 (six) hours as needed. 09/15/15   Leo Grosser, MD  albuterol (PROVENTIL HFA;VENTOLIN HFA) 108 (90 Base) MCG/ACT inhaler Inhale 2 puffs into the lungs every 6 (six) hours as needed for wheezing or shortness of breath. 04/25/16   Ghimire, Henreitta Leber, MD  apixaban (ELIQUIS) 5 MG TABS tablet Take 1 tablet (5 mg total) by mouth 2 (two) times daily. 04/26/19   Larey Dresser, MD  atorvastatin (LIPITOR) 40 MG tablet TAKE 1 TABLET(40 MG) BY MOUTH DAILY 04/26/19   Larey Dresser, MD  baclofen (LIORESAL) 10 MG tablet TK 1 T PO BID PRN Patient not taking: Reported on 07/13/2019 02/08/18   [provider]  carvedilol (COREG) 25 MG tablet TAKE 1 TABLET(25 MG) BY MOUTH TWICE DAILY WITH A MEAL 07/13/19   Larey Dresser, MD  furosemide (LASIX) 40 MG tablet Take 1 tablet (40 mg total) by mouth 2 (two) times daily. 05/06/18   Larey Dresser, MD  gabapentin (NEURONTIN) 100 MG capsule TK 1 C PO TID 03/10/18   [provider]  glucose blood (FREESTYLE TEST STRIPS) test strip Use as instructed 04/25/16   Ghimire, Henreitta Leber, MD  glucose monitoring kit (FREESTYLE) monitoring kit 1 each by Does not apply route 4 (four) times daily - after meals and at bedtime. 1 month Diabetic Testing Supplies for QAC-QHS accuchecks. 04/25/16   Ghimire, Henreitta Leber, MD  HUMALOG KWIKPEN 100 UNIT/ML KwikPen INJECT 10 UNITS INTO THE SKIN WITH [facility] MEAL 02/12/18   [provider]  insulin aspart (NOVOLOG FLEXPEN) 100 UNIT/ML FlexPen 0-20 Units, Subcutaneous, 3 times daily with meals CBG  < 70: implement hypoglycemia protocol CBG 70 - 120: 0 units CBG 121 - 150: 3 units CBG 151 - 200: 4 units CBG 201 - 250: 7 units CBG 251 - 300: 11 units CBG 301 - 350: 15 units CBG 351 - 400: 20 units CBG > 400: call MD 04/25/16   Jonetta Osgood, MD  Insulin Glargine (LANTUS SOLOSTAR) 100 UNIT/ML Solostar Pen Inject 50 Units into the skin daily at 10 pm. 10/20/17   Recardo Evangelist, PA-C  Insulin Pen Needle 32G X 8 MM MISC Use as directed 06/18/16   Arbutus Leas, NP  isosorbide-hydrALAZINE (BIDIL) 20-37.5 MG tablet Take 2 tablets by mouth 3 (three) times daily. 07/20/19   Larey Dresser, MD  Lancets (FREESTYLE) lancets Use as instructed 06/18/16   Arbutus Leas, NP  metFORMIN (GLUCOPHAGE-XR) 500 MG 24 hr tablet  TK 2 TS PO BID Patient not taking: Reported on 07/13/2019 11/03/18   [provider]  naproxen sodium (ALEVE) 220 MG tablet Take 220 mg by mouth daily as needed (for pain or headache).  Patient not taking: Reported on 07/13/2019    [provider]  Oxycodone HCl 10 MG TABS Take 10 mg by mouth 4 (four) times daily as needed (for pain).    [provider]  polyethylene glycol (MIRALAX / GLYCOLAX) packet Take 17 g by mouth daily as needed for mild constipation. 10/25/17   Raiford Noble Latif, DO  potassium chloride SA (KLOR-CON) 20 MEQ tablet Take 3 tablets (60 mEq total) by mouth daily. 04/26/19   Larey Dresser, MD  sacubitril-valsartan (ENTRESTO) 97-103 MG Take 1 tablet by mouth 2 (two) times daily. 04/26/19   Larey Dresser, MD  spironolactone (ALDACTONE) 25 MG tablet TAKE 1 TABLET(25 MG) BY MOUTH DAILY 04/26/19   Larey Dresser, MD  Tafamidis [facility]) 61 MG CAPS Take 61 mg by mouth daily. 02/09/19   Larey Dresser, MD    Allergies    Morphine and related and Penicillins  Review of Systems   Review of Systems  All other systems reviewed and are negative.   Physical Exam Updated Vital Signs BP (!) 142/107   Pulse 80   Temp 98 F (36.7 C)  (Oral)   Resp 13   Ht _0  (1.702 m)   Wt 108 kg   SpO2 98%   BMI 37.30 kg/m   Physical Exam Vitals and nursing note reviewed.  Constitutional:      General: She is not in acute distress.    Appearance: She is well-developed. She is obese. She is not ill-appearing, toxic-appearing or diaphoretic.  HENT:     Head: Normocephalic and atraumatic.     Right Ear: External ear normal.     Left Ear: External ear normal.  Eyes:     Conjunctiva/sclera: Conjunctivae normal.     Pupils: Pupils are equal, round, and reactive to light.  Neck:     Trachea: Phonation normal.  Cardiovascular:     Rate and Rhythm: Normal rate and regular rhythm.     Heart sounds: Normal heart sounds.  Pulmonary:     Effort: Pulmonary effort is normal.     Breath sounds: Normal breath sounds.  Abdominal:     General: There is no distension.     Palpations: Abdomen is soft.     Tenderness: There is no abdominal tenderness.  Musculoskeletal:        General: Normal range of motion.     Cervical back: Normal range of motion and neck supple.  Skin:    General: Skin is warm and dry.  Neurological:     Mental Status: She is alert and oriented to person, place, and time.     Cranial Nerves: No cranial nerve deficit.     Sensory: No sensory deficit.     Motor: No abnormal muscle tone.     Coordination: Coordination normal.  Psychiatric:        Mood and Affect: Mood normal.        Behavior: Behavior normal.        Thought Content: Thought content normal.        Judgment: Judgment normal.     ED Results / Procedures / Treatments   Labs (all labs ordered are listed, but only abnormal results are displayed) Labs Reviewed  CBC WITH DIFFERENTIAL/PLATELET - Abnormal; Notable  for the following components:      Result Value   RBC 3.85 (*)    Hemoglobin 11.8 (*)    All other components within normal limits  COMPREHENSIVE METABOLIC PANEL - Abnormal; Notable for the following components:   Sodium 134 (*)     Glucose, Bld 395 (*)    Creatinine, Ser 1.19 (*)    GFR calc non Af Amer 48 (*)    GFR calc Af Amer 55 (*)    All other components within normal limits  CBG MONITORING, ED - Abnormal; Notable for the following components:   Glucose-Capillary 310 (*)    All other components within normal limits  CBG MONITORING, ED - Abnormal; Notable for the following components:   Glucose-Capillary 250 (*)    All other components within normal limits  TROPONIN I (HIGH SENSITIVITY)  TROPONIN I (HIGH SENSITIVITY)    EKG EKG Interpretation  Date/Time:  Wednesday July 27 2019 13:05:01 EDT Ventricular Rate:  86 PR Interval:  188 QRS Duration: 114 QT Interval:  402 QTC Calculation: 481 R Axis:   44 Text Interpretation: indeterminate rhythym Low voltage QRS Cannot rule out Anterior infarct , age undetermined ST & T wave abnormality, consider lateral ischemia Abnormal ECG Since last tracing QRS changed Serial tracing suggested Confirmed by Daleen Bo [phone]) on 07/27/2019 4:17:25 PM     Radiology DG Chest 2 View  Result Date: 07/27/2019 CLINICAL DATA:  Cough, shortness of breath. EXAM: CHEST - 2 VIEW COMPARISON:  August 31, 2017. FINDINGS: The heart size and mediastinal contours are within normal limits. Left-sided pacemaker is unchanged in position. No pneumothorax or pleural effusion is noted. Both lungs are clear. The visualized skeletal structures are unremarkable. IMPRESSION: No active cardiopulmonary disease. Electronically Signed   By: Marijo Conception M.D.   On: 07/27/2019 13:25    Procedures Procedures (including critical care time)  Medications Ordered in ED Medications  insulin aspart (novoLOG) injection 15 Units (15 Units Subcutaneous Given 07/27/19 1742)  sodium chloride 0.9 % bolus 1,000 mL (1,000 mLs Intravenous New Bag/Given 07/27/19 1743)  prochlorperazine (COMPAZINE) injection 10 mg (10 mg Intravenous Given 07/27/19 1743)    ED Course  I have reviewed the triage vital signs and  the nursing notes.  Pertinent labs & imaging results that were available during my care of the patient were reviewed by me and considered in my medical decision making (see chart for details).  Clinical Course as of Jul 27 1902  Wed Jul 27, 2019  1613 Abnormal, high  CBG monitoring, ED(!) [EW]  1613 Normal except hemoglobin low  CBC with Differential(!) [EW]  1613 Normal  Troponin I (High Sensitivity) [EW]  1613 Normal except sodium low, glucose high, creatinine high, GFR low  Comprehensive metabolic panel(!) [EW]  2585 No infiltrate or edema, interpreted by me  DG Chest 2 View [EW]  1900 Alkaline Phosphatase: 89 [EW]    Clinical Course User Index [EW] Daleen Bo, MD   MDM Rules/Calculators/A&P                           Patient Vitals for the past 24 hrs:  BP Temp Temp src Pulse Resp SpO2 Height Weight  07/27/19 1835 (!) 142/107 -- -- 80 13 -- -- --  07/27/19 1654 -- -- -- -- -- -- -- 108 kg  07/27/19 1600 135/78 98 F (36.7 C) Oral 69 18 98 % -- --  07/27/19 1301 133/76 99.4 F (  37.4 C) Oral 86 18 98 % _0  (1.702 m) 107.5 kg    6:59 PM Reevaluation with update and discussion. After initial assessment and treatment, an updated evaluation reveals she is comfortable now and ready to go home.  She has been ambulatory without problems to the bathroom.  Findings discussed and questions answered. Daleen Bo   Medical Decision Making:  This patient is presenting for evaluation of headache and cough, which does require a range of treatment options, and is a complaint that involves a moderate risk of morbidity and mortality. The differential diagnoses include nonspecific headache, complications from diabetes, acute illness.. I decided to review old records, and in summary middle-aged female, with history of diabetes.  She did not take her medicines this morning.  She has nonspecific headache symptoms.  I did not require additional historical information from anyone. Further  globular Clinical Laboratory Tests Ordered, included metabolic panel CBC. Review indicates normal except glucose high, creatinine high, hemoglobin slightly low Radiologic Tests Ordered, included chest x-ray.  I independently Visualized: Radiographic images, which show no pneumonia or infiltrate   Critical Interventions-clinical evaluation, laboratory testing, chest x-ray, EKG, observation reassessment  After These Interventions, the Patient was reevaluated and was found improved, more comfortable after IV fluids and Compazine.  No evidence for pneumonia, heart failure, metabolic instability or impending vascular collapse.  Suspect mild dehydration contributing to symptoms, not requiring further intervention at this time.  CRITICAL CARE-no Performed by: Daleen Bo  Nursing Notes Reviewed/ Care Coordinated Applicable Imaging Reviewed Interpretation of Laboratory Data incorporated into ED treatment  The patient appears reasonably screened and/or stabilized for discharge and I doubt any other medical condition or other [facility] requiring further screening, evaluation, or treatment in the ED at this time prior to discharge.  Plan: Home Medications-continue usual medicines, Robitussin as needed for cough; Home Treatments-increase oral fluids; return here if the recommended treatment, does not improve the symptoms; Recommended follow up-PCP, as needed     Final Clinical Impression(s) / ED Diagnoses Final diagnoses:  Nonintractable headache, unspecified chronicity pattern, unspecified headache type  Hyperglycemia  Cough    Rx / DC Orders ED Discharge Orders    None       Daleen Bo, MD 07/27/19 1905

## 2019-07-27 NOTE — ED Triage Notes (Signed)
C/o cough and shortness of breath started last night.

## 2019-07-27 NOTE — ED Notes (Signed)
Pt weighed per MD orders (238.2 lbs)

## 2019-07-27 NOTE — ED Notes (Signed)
Pt's CBG result was 250. Informed Emmy - RN.

## 2019-07-27 NOTE — ED Notes (Signed)
Pt asking for pain med for headache. Informed Emmy - RN.

## 2019-08-03 ENCOUNTER — Encounter (HOSPITAL_COMMUNITY): Payer: Self-pay

## 2019-08-03 ENCOUNTER — Emergency Department (HOSPITAL_COMMUNITY): Payer: Medicare Other

## 2019-08-03 ENCOUNTER — Emergency Department (HOSPITAL_COMMUNITY)
Admission: EM | Admit: 2019-08-03 | Discharge: 2019-08-03 | Disposition: A | Discharge status: Home / Self Care | Payer: Medicare Other | Attending: Emergency Medicine | Admitting: Emergency Medicine

## 2019-08-03 ENCOUNTER — Other Ambulatory Visit: Payer: Self-pay

## 2019-08-03 DIAGNOSIS — Z794 Long term (current) use of insulin: Secondary | ICD-10-CM | POA: Insufficient documentation

## 2019-08-03 DIAGNOSIS — R05 Cough: Secondary | ICD-10-CM | POA: Insufficient documentation

## 2019-08-03 DIAGNOSIS — E119 Type 2 diabetes mellitus without complications: Secondary | ICD-10-CM | POA: Insufficient documentation

## 2019-08-03 DIAGNOSIS — I110 Hypertensive heart disease with heart failure: Secondary | ICD-10-CM | POA: Insufficient documentation

## 2019-08-03 DIAGNOSIS — I2510 Atherosclerotic heart disease of native coronary artery without angina pectoris: Secondary | ICD-10-CM | POA: Insufficient documentation

## 2019-08-03 DIAGNOSIS — Z79899 Other long term (current) drug therapy: Secondary | ICD-10-CM | POA: Insufficient documentation

## 2019-08-03 DIAGNOSIS — Z7901 Long term (current) use of anticoagulants: Secondary | ICD-10-CM | POA: Insufficient documentation

## 2019-08-03 DIAGNOSIS — R0789 Other chest pain: Secondary | ICD-10-CM | POA: Insufficient documentation

## 2019-08-03 DIAGNOSIS — R0602 Shortness of breath: Secondary | ICD-10-CM | POA: Diagnosis present

## 2019-08-03 DIAGNOSIS — I5042 Chronic combined systolic (congestive) and diastolic (congestive) heart failure: Secondary | ICD-10-CM | POA: Insufficient documentation

## 2019-08-03 DIAGNOSIS — J40 Bronchitis, not specified as acute or chronic: Secondary | ICD-10-CM | POA: Diagnosis not present

## 2019-08-03 LAB — CBC WITH DIFFERENTIAL/PLATELET
Abs Immature Granulocytes: 0.04 10*3/uL (ref 0.00–0.07)
Basophils Absolute: 0 10*3/uL (ref 0.0–0.1)
Basophils Relative: 1 %
Eosinophils Absolute: 0.2 10*3/uL (ref 0.0–0.5)
Eosinophils Relative: 2 %
HCT: 35.1 % — ABNORMAL LOW (ref 36.0–46.0)
Hemoglobin: 11.7 g/dL — ABNORMAL LOW (ref 12.0–15.0)
Immature Granulocytes: 1 %
Lymphocytes Relative: 27 %
Lymphs Abs: 2 10*3/uL (ref 0.7–4.0)
MCH: 30.1 pg (ref 26.0–34.0)
MCHC: 33.3 g/dL (ref 30.0–36.0)
MCV: 90.2 fL (ref 80.0–100.0)
Monocytes Absolute: 0.5 10*3/uL (ref 0.1–1.0)
Monocytes Relative: 6 %
Neutro Abs: 4.6 10*3/uL (ref 1.7–7.7)
Neutrophils Relative %: 63 %
Platelets: 321 10*3/uL (ref 150–400)
RBC: 3.89 MIL/uL (ref 3.87–5.11)
RDW: 12.6 % (ref 11.5–15.5)
WBC: 7.4 10*3/uL (ref 4.0–10.5)
nRBC: 0 % (ref 0.0–0.2)

## 2019-08-03 LAB — BASIC METABOLIC PANEL
Anion gap: 10 (ref 5–15)
BUN: 16 mg/dL (ref 8–23)
CO2: 27 mmol/L (ref 22–32)
Calcium: 8.9 mg/dL (ref 8.9–10.3)
Chloride: 101 mmol/L (ref 98–111)
Creatinine, Ser: 1 mg/dL (ref 0.44–1.00)
GFR calc Af Amer: >60 mL/min (ref >60)
GFR calc non Af Amer: 59 mL/min — ABNORMAL LOW (ref >60)
Glucose, Bld: 292 mg/dL — ABNORMAL HIGH (ref 70–99)
Potassium: 3.5 mmol/L (ref 3.5–5.1)
Sodium: 138 mmol/L (ref 135–145)

## 2019-08-03 LAB — TROPONIN I (HIGH SENSITIVITY): Troponin I (High Sensitivity): 11 ng/L (ref <18)

## 2019-08-03 MED ORDER — DOXYCYCLINE HYCLATE 100 MG PO CAPS
100.0000 mg | ORAL_CAPSULE | Freq: Two times a day (BID) | ORAL | 0 refills | Status: AC
Start: 2019-08-03 — End: 2019-08-08

## 2019-08-03 MED ORDER — BENZONATATE 100 MG PO CAPS
100.0000 mg | ORAL_CAPSULE | Freq: Three times a day (TID) | ORAL | 0 refills | Status: DC
Start: 2019-08-03 — End: 2019-12-13

## 2019-08-03 MED ORDER — ALBUTEROL SULFATE HFA 108 (90 BASE) MCG/ACT IN AERS
2.0000 | INHALATION_SPRAY | Freq: Once | RESPIRATORY_TRACT | Status: AC
  Administered 2019-08-03: 2 via RESPIRATORY_TRACT
  Filled 2019-08-03: qty 6.7

## 2019-08-03 MED ORDER — BENZONATATE 100 MG PO CAPS
200.0000 mg | ORAL_CAPSULE | Freq: Once | ORAL | Status: AC
  Administered 2019-08-03: 200 mg via ORAL
  Filled 2019-08-03: qty 2

## 2019-08-03 NOTE — ED Triage Notes (Signed)
Pt reports continued cough, taking OTC cough medication without relief, pt seen here last week for the same. resp e.u

## 2019-08-03 NOTE — Discharge Instructions (Signed)
Take antibiotics as prescribed. Take the [facility] as needed for cough and you can continue using her home inhalers and breathing treatments. Follow-up with your primary care provider. Return to the ER for worsening cough, shortness of breath, chest pain, leg swelling.

## 2019-08-03 NOTE — ED Provider Notes (Signed)
[facility] EMERGENCY DEPARTMENT Provider Note   CSN: [id number] Arrival date & time: 08/03/19  1211     History Chief Complaint  Patient presents with  . Cough  . Shortness of Breath    [name] is a 69 y.o. female with a past medical history of CAD, hypertension, diabetes, hyperlipidemia, chronic bronchitis presenting to the ED with a chief complaint of productive cough, shortness of breath and chest pain.  She has had symptoms for the past 8 to 10 days.  She was seen and evaluated on 07/27/2019, told to take over-the-counter cough medications after reassuring work-up.  She has been taking this but feels like her cough is getting worse.  She is now coughing of green mucus.  She has shortness of breath that only minimally improved with her albuterol.  States that she has chest pain as well anytime she tries to cough.  She denies any leg swelling, hemoptysis or chest pain at rest.  She denies any sick contacts with similar symptoms.  States that this feels similar to her prior bronchitis flareups.  Denies any abdominal pain, vomiting, fever.  HPI     Past Medical History:  Diagnosis Date  . Anemia    a. Noted on 07/2012 labs, instructed to f/u PCP.  Marland Kitchen Arthritis    "joints" (11/18/2012)  . CAD (coronary artery disease), native coronary artery    a. Nonobstructive by cath 02/2012 (done because of low EF).  . Chronic bronchitis ([place])    "~ every other year" (11/18/2012)  . Chronic combined systolic and diastolic CHF (congestive heart failure) ([place])    a. 03/05/12 echo:  LVEF 20-25%, moderate LVH , inferior and basal to mid septal akinesis, anterior moderate to severe hypokinesis and grade 2 diastolic dysfunction. b. EF 07/2012: EF still 25% (unclear medication compliance).  . Chronic lower back pain   . Headache(784.0)    "often; maybe not daily" (11/18/2012)  . High cholesterol   . History of noncompliance with medical treatment   . Hypertension   . LBBB (left  bundle branch block)   . Orthopnea   . Tobacco abuse   . Type II diabetes mellitus [facility])     Patient Active Problem List   Diagnosis Date Noted  . Right foot infection 10/22/2017  . Cellulitis in diabetic foot ([place]) 10/22/2017  . Hyperglycemia 10/22/2017  . Lumbar radiculopathy 09/18/2017  . Degenerative spondylolisthesis 09/18/2017  . Atrial fibrillation ([place]) 02/11/2017  . Paroxysmal A-fib ([place])   . Biventricular automatic implantable cardioverter defibrillator in situ   . Sepsis ([place]) 04/20/2016  . UTI (urinary tract infection) 04/20/2016  . Dyspnea 07/19/2015  . Interstitial lung disease ([place]) 11/20/2014  . SIRS (systemic inflammatory response syndrome) ([place]) 02/03/2014  . IDDM (insulin dependent diabetes mellitus) 02/03/2014  . Tachycardia 06/14/2013  . Chronic systolic CHF (congestive heart failure) ([place]) 09/29/2012  . Hypoxia 03/06/2012  . Hypertensive heart disease 03/06/2012  . Nonischemic cardiomyopathy ([place]) 03/06/2012  . Tobacco abuse 03/06/2012  . Type 2 diabetes mellitus ([place]) 03/06/2012  . Hypertension 03/06/2012  . CAD (coronary artery disease), native coronary artery 03/06/2012  . Hypokalemia 03/06/2012  . Acute bronchitis 02/01/2009  . Sleep apnea 02/01/2009  . Chest pain 02/01/2009    Past Surgical History:  Procedure Laterality Date  . BI-VENTRICULAR IMPLANTABLE CARDIOVERTER DEFIBRILLATOR N/A 11/18/2012   Procedure: BI-VENTRICULAR IMPLANTABLE CARDIOVERTER DEFIBRILLATOR  (CRT-D);  Surgeon: Evans Lance, MD;  Location: [facility] CATH LAB;  Service: Cardiovascular;  Laterality: N/A;  .  BI-VENTRICULAR IMPLANTABLE CARDIOVERTER DEFIBRILLATOR  (CRT-D)  11/18/2012  . CARDIAC CATHETERIZATION  03/04/12   nonobstructive CAD, elevated LVEDP and tortuous vessels suggestive of long-standing hypertension  . COLONOSCOPY WITH PROPOFOL N/A 12/12/2016   Procedure: COLONOSCOPY WITH PROPOFOL;  Surgeon: Jeani Hawking, MD;  Location: [facility] ENDOSCOPY;  Service: Endoscopy;  Laterality:  N/A;  . JOINT REPLACEMENT     Bilateral hip and right knee  . LEFT HEART CATH N/A 03/05/2012   Procedure: LEFT HEART CATH;  Surgeon: Laurey Morale, MD;  Location: [facility] CATH LAB;  Service: Cardiovascular;  Laterality: N/A;     OB History   No obstetric history on file.     Family History  Problem Relation Age of Onset  . Heart disease Neg Hx     Social History   Tobacco Use  . Smoking status: Former Smoker    Packs/day: 1.00    Years: 40.00    Pack years: 40.00    Types: Cigarettes    Start date: 06/23/1972    Quit date: 03/26/2012    Years since quitting: 7.3  . Smokeless tobacco: Never Used  Vaping Use  . Vaping Use: Never assessed  Substance Use Topics  . Alcohol use: No  . Drug use: No    Home Medications Prior to Admission medications   Medication Sig Start Date End Date Taking? Authorizing Provider  acetaminophen (TYLENOL) 500 MG tablet Take 1 tablet (500 mg total) by mouth every 6 (six) hours as needed. 09/15/15   Lyndal Pulley, MD  albuterol (PROVENTIL HFA;VENTOLIN HFA) 108 (90 Base) MCG/ACT inhaler Inhale 2 puffs into the lungs every 6 (six) hours as needed for wheezing or shortness of breath. 04/25/16   Ghimire, Werner Lean, MD  apixaban (ELIQUIS) 5 MG TABS tablet Take 1 tablet (5 mg total) by mouth 2 (two) times daily. 04/26/19   Laurey Morale, MD  atorvastatin (LIPITOR) 40 MG tablet TAKE 1 TABLET(40 MG) BY MOUTH DAILY 04/26/19   Laurey Morale, MD  baclofen (LIORESAL) 10 MG tablet TK 1 T PO BID PRN Patient not taking: Reported on 07/13/2019 02/08/18   [provider]  benzonatate (TESSALON) 100 MG capsule Take 1 capsule (100 mg total) by mouth every 8 (eight) hours. 08/03/19   Macaela Presas, PA-C  carvedilol (COREG) 25 MG tablet TAKE 1 TABLET(25 MG) BY MOUTH TWICE DAILY WITH A MEAL 07/13/19   Laurey Morale, MD  doxycycline (VIBRAMYCIN) 100 MG capsule Take 1 capsule (100 mg total) by mouth 2 (two) times daily for 5 days. 08/03/19 08/08/19  Cassandr Cederberg, PA-C    furosemide (LASIX) 40 MG tablet Take 1 tablet (40 mg total) by mouth 2 (two) times daily. 05/06/18   Laurey Morale, MD  gabapentin (NEURONTIN) 100 MG capsule TK 1 C PO TID 03/10/18   [provider]  glucose blood (FREESTYLE TEST STRIPS) test strip Use as instructed 04/25/16   Ghimire, Werner Lean, MD  glucose monitoring kit (FREESTYLE) monitoring kit 1 each by Does not apply route 4 (four) times daily - after meals and at bedtime. 1 month Diabetic Testing Supplies for QAC-QHS accuchecks. 04/25/16   Ghimire, Werner Lean, MD  HUMALOG KWIKPEN 100 UNIT/ML KwikPen INJECT 10 UNITS INTO THE SKIN WITH [facility] MEAL 02/12/18   [provider]  insulin aspart (NOVOLOG FLEXPEN) 100 UNIT/ML FlexPen 0-20 Units, Subcutaneous, 3 times daily with meals CBG < 70: implement hypoglycemia protocol CBG 70 - 120: 0 units CBG 121 - 150: 3 units CBG 151 -  200: 4 units CBG 201 - 250: 7 units CBG 251 - 300: 11 units CBG 301 - 350: 15 units CBG 351 - 400: 20 units CBG > 400: call MD 04/25/16   Jonetta Osgood, MD  Insulin Glargine (LANTUS SOLOSTAR) 100 UNIT/ML Solostar Pen Inject 50 Units into the skin daily at 10 pm. 10/20/17   Recardo Evangelist, PA-C  Insulin Pen Needle 32G X 8 MM MISC Use as directed 06/18/16   Arbutus Leas, NP  isosorbide-hydrALAZINE (BIDIL) 20-37.5 MG tablet Take 2 tablets by mouth 3 (three) times daily. 07/20/19   Larey Dresser, MD  Lancets (FREESTYLE) lancets Use as instructed 06/18/16   Arbutus Leas, NP  metFORMIN (GLUCOPHAGE-XR) 500 MG 24 hr tablet TK 2 TS PO BID Patient not taking: Reported on 07/13/2019 11/03/18   [provider]  naproxen sodium (ALEVE) 220 MG tablet Take 220 mg by mouth daily as needed (for pain or headache).  Patient not taking: Reported on 07/13/2019    [provider]  Oxycodone HCl 10 MG TABS Take 10 mg by mouth 4 (four) times daily as needed (for pain).    [provider]  polyethylene glycol (MIRALAX / GLYCOLAX) packet Take 17 g  by mouth daily as needed for mild constipation. 10/25/17   Raiford Noble Latif, DO  potassium chloride SA (KLOR-CON) 20 MEQ tablet Take 3 tablets (60 mEq total) by mouth daily. 04/26/19   Larey Dresser, MD  sacubitril-valsartan (ENTRESTO) 97-103 MG Take 1 tablet by mouth 2 (two) times daily. 04/26/19   Larey Dresser, MD  spironolactone (ALDACTONE) 25 MG tablet TAKE 1 TABLET(25 MG) BY MOUTH DAILY 04/26/19   Larey Dresser, MD  Tafamidis [facility]) 61 MG CAPS Take 61 mg by mouth daily. 02/09/19   Larey Dresser, MD    Allergies    Morphine and related and Penicillins  Review of Systems   Review of Systems  Constitutional: Negative for appetite change, chills and fever.  HENT: Negative for ear pain, rhinorrhea, sneezing and sore throat.   Eyes: Negative for photophobia and visual disturbance.  Respiratory: Positive for cough, chest tightness and shortness of breath. Negative for wheezing.   Cardiovascular: Positive for chest pain. Negative for palpitations.  Gastrointestinal: Negative for abdominal pain, blood in stool, constipation, diarrhea, nausea and vomiting.  Genitourinary: Negative for dysuria, hematuria and urgency.  Musculoskeletal: Negative for myalgias.  Skin: Negative for rash.  Neurological: Negative for dizziness, weakness and light-headedness.    Physical Exam Updated Vital Signs BP (!) 155/82   Pulse 85   Temp 98.3 F (36.8 C) (Oral)   Resp 16   Ht _0  (1.702 m)   Wt 107.5 kg   SpO2 100%   BMI 37.12 kg/m   Physical Exam Vitals and nursing note reviewed.  Constitutional:      General: She is not in acute distress.    Appearance: She is well-developed.     Comments: Speaking in complete sentences without difficulty.  HENT:     Head: Normocephalic and atraumatic.     Nose: Nose normal.  Eyes:     General: No scleral icterus.       Left eye: No discharge.     Conjunctiva/sclera: Conjunctivae normal.  Cardiovascular:     Rate and Rhythm: Normal rate  and regular rhythm.     Heart sounds: Normal heart sounds. No murmur heard.  No friction rub. No gallop.   Pulmonary:     Effort:  Pulmonary effort is normal. No respiratory distress.     Breath sounds: Normal breath sounds.  Abdominal:     General: Bowel sounds are normal. There is no distension.     Palpations: Abdomen is soft.     Tenderness: There is no abdominal tenderness. There is no guarding.  Musculoskeletal:        General: Normal range of motion.     Cervical back: Normal range of motion and neck supple.     Right lower leg: No tenderness. No edema.     Left lower leg: No tenderness. No edema.  Skin:    General: Skin is warm and dry.     Findings: No rash.  Neurological:     Mental Status: She is alert.     Motor: No abnormal muscle tone.     Coordination: Coordination normal.     ED Results / Procedures / Treatments   Labs (all labs ordered are listed, but only abnormal results are displayed) Labs Reviewed  BASIC METABOLIC PANEL - Abnormal; Notable for the following components:      Result Value   Glucose, Bld 292 (*)    GFR calc non Af Amer 59 (*)    All other components within normal limits  CBC WITH DIFFERENTIAL/PLATELET - Abnormal; Notable for the following components:   Hemoglobin 11.7 (*)    HCT 35.1 (*)    All other components within normal limits  TROPONIN I (HIGH SENSITIVITY)    EKG EKG Interpretation  Date/Time:  Wednesday August 03 2019 14:38:25 EDT Ventricular Rate:  76 PR Interval:    QRS Duration: 125 QT Interval:  417 QTC Calculation: 469 R Axis:   67 Text Interpretation: Sinus rhythm Nonspecific intraventricular conduction delay Anteroseptal infarct, age indeterminate Baseline wander in lead(s) V2 V3 V4 V5 No significant change since 07/27/2019 Confirmed by Veryl Speak [phone]) on 08/03/2019 2:58:07 PM   Radiology DG Chest 2 View  Result Date: 08/03/2019 CLINICAL DATA:  Cough. EXAM: CHEST - 2 VIEW COMPARISON:  July 27, 2019. FINDINGS: The  heart size and mediastinal contours are within normal limits. Left-sided pacemaker is unchanged in position. No pneumothorax or pleural effusion is noted. Both lungs are clear. The visualized skeletal structures are unremarkable. IMPRESSION: No active cardiopulmonary disease. Electronically Signed   By: Marijo Conception M.D.   On: 08/03/2019 14:39    Procedures Procedures (including critical care time)  Medications Ordered in ED Medications  albuterol (VENTOLIN HFA) 108 (90 Base) MCG/ACT inhaler 2 puff (2 puffs Inhalation Given 08/03/19 1421)  benzonatate (TESSALON) capsule 200 mg (200 mg Oral Given 08/03/19 1421)    ED Course  I have reviewed the triage vital signs and the nursing notes.  Pertinent labs & imaging results that were available during my care of the patient were reviewed by me and considered in my medical decision making (see chart for details).    MDM Rules/Calculators/A&P                          69 year old female with a past medical history of CAD, hypertension, diabetes, hyperlipidemia, bronchitis presenting to ED with a chief complaint of productive cough, shortness of breath and chest pain.  Symptoms for the past 8 to 10 days, she was seen on 07/27/2019 with symptoms began with reassuring work-up and told to take over-the-counter cough medicine.  States that she is now coughing up green mucus.  Chest pain is worse when she coughs  but denies chest pain at rest.  No leg swelling noted.  On exam patient's lungs are clear to auscultation bilaterally.  No cough noted on exam.  She is not tachycardic, tachypneic or hypoxic.  Reviewed work-up from last week which was reassuring.  EKG here shows sinus rhythm, no changes from prior tracings.  Chest x-ray is unremarkable.  BMP, CBC unremarkable.  Troponin is negative x1.  Patient was given albuterol and Tessalon Perles here with some improvement in her symptoms.  Due to duration of her symptoms and change in her mucus production will treat  with antibiotics.  We will have her continue Tessalon Perles and follow-up with PCP.  All imaging, if done today, including plain films, CT scans, and ultrasounds, independently reviewed by me, and interpretations confirmed via formal radiology reads.  Patient is hemodynamically stable, in NAD, and able to ambulate in the ED. Evaluation does not show pathology that would require ongoing emergent intervention or inpatient treatment. I explained the diagnosis to the patient. Pain has been managed and has no complaints prior to discharge. Patient is comfortable with above plan and is stable for discharge at this time. All questions were answered prior to disposition. Strict return precautions for returning to the ED were discussed. Encouraged follow up with PCP.   An After Visit Summary was printed and given to the patient.   Portions of this note were generated with [employer]. Dictation errors may occur despite best attempts at proofreading.  Final Clinical Impression(s) / ED Diagnoses Final diagnoses:  Bronchitis    Rx / DC Orders ED Discharge Orders         Ordered    doxycycline (VIBRAMYCIN) 100 MG capsule  2 times daily     Discontinue  Reprint     08/03/19 1520    benzonatate (TESSALON) 100 MG capsule  Every 8 hours     Discontinue  Reprint     08/03/19 [address], PA-C 08/03/19 1522    Veryl Speak, MD 08/03/19 1540

## 2019-08-04 ENCOUNTER — Telehealth (HOSPITAL_COMMUNITY): Payer: Self-pay

## 2019-08-04 NOTE — Telephone Encounter (Signed)
I called Ms Viall to see if she had picked up Bidil from the pharmacy. She stated the pharmacy only gave her another partial fill and she was out again. I advised her to call the pharmacy and have them fill the prescription again and she was agreeable. She stated she would call me when she has the medication so I can come put it in her box.   [name], EMT 08/04/19

## 2019-08-05 ENCOUNTER — Other Ambulatory Visit (HOSPITAL_COMMUNITY): Payer: Self-pay | Admitting: *Deleted

## 2019-08-05 ENCOUNTER — Other Ambulatory Visit (HOSPITAL_COMMUNITY): Payer: Self-pay

## 2019-08-05 MED ORDER — FUROSEMIDE 40 MG PO TABS: 40.0000 mg | ORAL_TABLET | Freq: Two times a day (BID) | ORAL | 3 refills | Status: DC

## 2019-08-05 NOTE — Progress Notes (Signed)
[name] was seen at home today to add Bidil to her pillbox. Was not scheduled for a full visit today so I just amended her box and will return next week. [name] was understanding and agreeable.  [name], EMT 08/05/19

## 2019-08-11 ENCOUNTER — Telehealth (INDEPENDENT_AMBULATORY_CARE_PROVIDER_SITE_OTHER): Payer: Self-pay

## 2019-08-12 ENCOUNTER — Telehealth (HOSPITAL_COMMUNITY): Payer: Self-pay | Admitting: Pharmacy Technician

## 2019-08-12 NOTE — Telephone Encounter (Signed)
Received a message from [facility] stating that they have tried to contact the patient several times for a refill of Vyndamax and have not been successful.  Called and spoke with patient, provided her phone number to call [facility] back [phone]).  [name], CPhT

## 2019-08-16 ENCOUNTER — Other Ambulatory Visit (HOSPITAL_COMMUNITY): Payer: Self-pay | Admitting: Cardiology

## 2019-08-16 ENCOUNTER — Other Ambulatory Visit (HOSPITAL_COMMUNITY): Payer: Self-pay

## 2019-08-16 MED ORDER — ENTRESTO 97-103 MG PO TABS: 1.0000 | ORAL_TABLET | Freq: Two times a day (BID) | ORAL | 3 refills | Status: DC

## 2019-08-16 NOTE — Progress Notes (Signed)
Paramedicine Encounter    Patient ID: [name], female    DOB: 1950/04/10, 69 y.o.   MRN: [id number]   Patient Care Team: System, Pcp Not In as PCP - General [place], Connye Burkitt, LCSW as Social Worker (Licensed Holiday representative)  Patient Active Problem List   Diagnosis Date Noted  . Right foot infection 10/22/2017  . Cellulitis in diabetic foot ([place]) 10/22/2017  . Hyperglycemia 10/22/2017  . Lumbar radiculopathy 09/18/2017  . Degenerative spondylolisthesis 09/18/2017  . Atrial fibrillation ([place]) 02/11/2017  . Paroxysmal A-fib ([place])   . Biventricular automatic implantable cardioverter defibrillator in situ   . Sepsis ([place]) 04/20/2016  . UTI (urinary tract infection) 04/20/2016  . Dyspnea 07/19/2015  . Interstitial lung disease ([place]) 11/20/2014  . SIRS (systemic inflammatory response syndrome) ([place]) 02/03/2014  . IDDM (insulin dependent diabetes mellitus) 02/03/2014  . Tachycardia 06/14/2013  . Chronic systolic CHF (congestive heart failure) ([place]) 09/29/2012  . Hypoxia 03/06/2012  . Hypertensive heart disease 03/06/2012  . Nonischemic cardiomyopathy ([place]) 03/06/2012  . Tobacco abuse 03/06/2012  . Type 2 diabetes mellitus ([place]) 03/06/2012  . Hypertension 03/06/2012  . CAD (coronary artery disease), native coronary artery 03/06/2012  . Hypokalemia 03/06/2012  . Acute bronchitis 02/01/2009  . Sleep apnea 02/01/2009  . Chest pain 02/01/2009    Current Outpatient Medications:  .  acetaminophen (TYLENOL) 500 MG tablet, Take 1 tablet (500 mg total) by mouth every 6 (six) hours as needed., Disp: 30 tablet, Rfl: 0 .  albuterol (PROVENTIL HFA;VENTOLIN HFA) 108 (90 Base) MCG/ACT inhaler, Inhale 2 puffs into the lungs every 6 (six) hours as needed for wheezing or shortness of breath., Disp: 18 g, Rfl: 0 .  atorvastatin (LIPITOR) 40 MG tablet, TAKE 1 TABLET(40 MG) BY MOUTH DAILY, Disp: 30 tablet, Rfl: 11 .  carvedilol (COREG) 25 MG tablet, TAKE 1 TABLET(25 MG) BY MOUTH TWICE DAILY  WITH A MEAL, Disp: 180 tablet, Rfl: 1 .  ELIQUIS 5 MG TABS tablet, TAKE 1 TABLET(5 MG) BY MOUTH TWICE DAILY, Disp: 180 tablet, Rfl: 3 .  furosemide (LASIX) 40 MG tablet, Take 1 tablet (40 mg total) by mouth 2 (two) times daily., Disp: 180 tablet, Rfl: 3 .  gabapentin (NEURONTIN) 100 MG capsule, TK 1 C PO TID, Disp: , Rfl:  .  isosorbide-hydrALAZINE (BIDIL) 20-37.5 MG tablet, Take 2 tablets by mouth 3 (three) times daily., Disp: 180 tablet, Rfl: 6 .  potassium chloride SA (KLOR-CON) 20 MEQ tablet, TAKE 3 TABLETS(60 MEQ) BY MOUTH DAILY, Disp: 270 tablet, Rfl: 3 .  spironolactone (ALDACTONE) 25 MG tablet, TAKE 1 TABLET(25 MG) BY MOUTH DAILY, Disp: 30 tablet, Rfl: 3 .  Tafamidis (VYNDAMAX) 61 MG CAPS, Take 61 mg by mouth daily., Disp: 90 capsule, Rfl: 3 .  baclofen (LIORESAL) 10 MG tablet, TK 1 T PO BID PRN (Patient not taking: Reported on 08/16/2019), Disp: , Rfl:  .  benzonatate (TESSALON) 100 MG capsule, Take 1 capsule (100 mg total) by mouth every 8 (eight) hours. (Patient not taking: Reported on 08/16/2019), Disp: 21 capsule, Rfl: 0 .  glucose blood (FREESTYLE TEST STRIPS) test strip, Use as instructed, Disp: 100 each, Rfl: 0 .  glucose monitoring kit (FREESTYLE) monitoring kit, 1 each by Does not apply route 4 (four) times daily - after meals and at bedtime. 1 month Diabetic Testing Supplies for QAC-QHS accuchecks., Disp: 1 each, Rfl: 1 .  HUMALOG KWIKPEN 100 UNIT/ML KwikPen, INJECT 10 UNITS INTO THE SKIN WITH EACH MEAL, Disp: , Rfl:  .  insulin aspart (NOVOLOG FLEXPEN) 100 UNIT/ML FlexPen, 0-20 Units, Subcutaneous, 3 times daily with meals CBG < 70: implement hypoglycemia protocol CBG 70 - 120: 0 units CBG 121 - 150: 3 units CBG 151 - 200: 4 units CBG 201 - 250: 7 units CBG 251 - 300: 11 units CBG 301 - 350: 15 units CBG 351 - 400: 20 units CBG > 400: call MD, Disp: 15 mL, Rfl: 0 .  Insulin Glargine (LANTUS SOLOSTAR) 100 UNIT/ML Solostar Pen, Inject 50 Units into the skin daily at 10 pm., Disp: 15 mL,  Rfl: 0 .  Insulin Pen Needle 32G X 8 MM MISC, Use as directed, Disp: 100 each, Rfl: 0 .  Lancets (FREESTYLE) lancets, Use as instructed, Disp: 100 each, Rfl: 0 .  metFORMIN (GLUCOPHAGE-XR) 500 MG 24 hr tablet, TK 2 TS PO BID (Patient not taking: Reported on 07/13/2019), Disp: , Rfl:  .  naproxen sodium (ALEVE) 220 MG tablet, Take 220 mg by mouth daily as needed (for pain or headache).  (Patient not taking: Reported on 07/13/2019), Disp: , Rfl:  .  Oxycodone HCl 10 MG TABS, Take 10 mg by mouth 4 (four) times daily as needed (for pain). (Patient not taking: Reported on 08/05/2019), Disp: , Rfl:  .  polyethylene glycol (MIRALAX / GLYCOLAX) packet, Take 17 g by mouth daily as needed for mild constipation., Disp: 14 each, Rfl: 0 .  sacubitril-valsartan (ENTRESTO) 97-103 MG, Take 1 tablet by mouth 2 (two) times daily., Disp: 180 tablet, Rfl: 3 Allergies  Allergen Reactions  . Morphine And Related Nausea Only    Severe nausea  . Penicillins Other (See Comments)    Pt states she has had a pain in her leg since a penicillin injection 2 months ago (reported 08/31/17).  Has tolerated amoxicillin oral.  Has patient had a PCN reaction causing immediate rash, facial/tongue/throat swelling, SOB or lightheadedness with hypotension: No Has patient had a PCN reaction causing severe rash involving mucus membranes or skin necrosis: No Has patient had a PCN reaction that required hospitalization: No Has patient had a PCN reaction occurring within the last 10 years: Yes If a      Social History   Socioeconomic History  . Marital status: Single    Spouse name: Not on file  . Number of children: 4  . Years of education: 46  . Highest education level: Not on file  Occupational History  . Not on file  Tobacco Use  . Smoking status: Former Smoker    Packs/day: 1.00    Years: 40.00    Pack years: 40.00    Types: Cigarettes    Start date: 06/23/1972    Quit date: 03/26/2012    Years since quitting: 7.3  .  Smokeless tobacco: Never Used  Vaping Use  . Vaping Use: Never assessed  Substance and Sexual Activity  . Alcohol use: No  . Drug use: No  . Sexual activity: Yes  Other Topics Concern  . Not on file  Social History Narrative  . Not on file   Social Determinants of Health   Financial Resource Strain:   . Difficulty of Paying Living Expenses:   Food Insecurity:   . Worried About Charity fundraiser in the Last Year:   . Arboriculturist in the Last Year:   Transportation Needs:   . Film/video editor (Medical):   Marland Kitchen Lack of Transportation (Non-Medical):   Physical Activity:   . Days of Exercise per Week:   .  Minutes of Exercise per Session:   Stress:   . Feeling of Stress :   Social Connections:   . Frequency of Communication with Friends and Family:   . Frequency of Social Gatherings with Friends and Family:   . Attends Religious Services:   . Active Member of Clubs or Organizations:   . Attends Archivist Meetings:   Marland Kitchen Marital Status:   Intimate Partner Violence:   . Fear of Current or Ex-Partner:   . Emotionally Abused:   Marland Kitchen Physically Abused:   . Sexually Abused:     Physical Exam Cardiovascular:     Rate and Rhythm: Normal rate and regular rhythm.     Pulses: Normal pulses.  Pulmonary:     Effort: Pulmonary effort is normal.     Breath sounds: Normal breath sounds.  Musculoskeletal:        General: Normal range of motion.     Right lower leg: Edema present.     Left lower leg: Edema present.  Skin:    General: Skin is warm and dry.     Capillary Refill: Capillary refill takes less than 2 seconds.  Neurological:     Mental Status: She is alert and oriented to person, place, and time.  Psychiatric:        Mood and Affect: Mood normal.         Future Appointments  Date Time Provider [place]  08/22/2019 10:20 AM Larey Dresser, MD [facility] None  09/05/2019  8:05 AM [facility] DEVICE REMOTES [facility]  12/05/2019   8:05 AM [facility] DEVICE REMOTES [facility]  01/09/2020 10:00 AM Bernarda Caffey, MD [facility] None    BP 140/65 (BP Location: Left Arm, Patient Position: Sitting, Cuff Size: Normal)   Pulse 90   Resp 16   Wt 240 lb (108.9 kg)   SpO2 97%   BMI 37.59 kg/m   Weight yesterday- did not weigh  Last visit weight- 237 lb  Ms Delay was seen at home today and reported feeling generally well. She denied chest pain, SOB, headache, dizziness, orthopnea, fever or cough since our last visit. She stated she has been compliant with her medications over the past week but her weight has jumped up slightly. She was out of town over the weekend and reported eating well, which is likely the cause of her weight gain. She exhibited some lower extremity edema but was otherwise asymptomatic of fluid overload. Upon verifying her medications, I noted that she did not have lasix of which, according to the pharmacy, she picked up a three month supply last week. We looked around her house but were unable to locate the bottle. She has enough to make it until Friday but will need a 3 month supply at that time. I will work with the clinic to figure this out. Additionally she will need to pick up potassium, eliquis and entresto by Friday so I can finish filling her pillbox before she leaves for the weekend. Mr Wellen is understanding on this and advised she would get the medications in the next 2-3 days. I will follow up Friday   [name], [place] 08/16/19  ACTION: Home visit completed Next visit planned for Friday

## 2019-08-17 ENCOUNTER — Other Ambulatory Visit (HOSPITAL_COMMUNITY): Payer: Self-pay | Admitting: Cardiology

## 2019-08-22 ENCOUNTER — Encounter (HOSPITAL_COMMUNITY): Payer: Medicare Other | Admitting: Cardiology

## 2019-08-23 ENCOUNTER — Other Ambulatory Visit (HOSPITAL_COMMUNITY): Payer: Self-pay

## 2019-08-23 NOTE — Progress Notes (Signed)
Paramedicine Encounter    Patient ID: [name], female    DOB: 1950-08-17, 69 y.o.   MRN: [id number]   Patient Care Team: System, Pcp Not In as PCP - General [place], Connye Burkitt, LCSW as Social Worker (Licensed Holiday representative)  Patient Active Problem List   Diagnosis Date Noted  . Right foot infection 10/22/2017  . Cellulitis in diabetic foot ([place]) 10/22/2017  . Hyperglycemia 10/22/2017  . Lumbar radiculopathy 09/18/2017  . Degenerative spondylolisthesis 09/18/2017  . Atrial fibrillation ([place]) 02/11/2017  . Paroxysmal A-fib ([place])   . Biventricular automatic implantable cardioverter defibrillator in situ   . Sepsis ([place]) 04/20/2016  . UTI (urinary tract infection) 04/20/2016  . Dyspnea 07/19/2015  . Interstitial lung disease ([place]) 11/20/2014  . SIRS (systemic inflammatory response syndrome) ([place]) 02/03/2014  . IDDM (insulin dependent diabetes mellitus) 02/03/2014  . Tachycardia 06/14/2013  . Chronic systolic CHF (congestive heart failure) ([place]) 09/29/2012  . Hypoxia 03/06/2012  . Hypertensive heart disease 03/06/2012  . Nonischemic cardiomyopathy ([place]) 03/06/2012  . Tobacco abuse 03/06/2012  . Type 2 diabetes mellitus ([place]) 03/06/2012  . Hypertension 03/06/2012  . CAD (coronary artery disease), native coronary artery 03/06/2012  . Hypokalemia 03/06/2012  . Acute bronchitis 02/01/2009  . Sleep apnea 02/01/2009  . Chest pain 02/01/2009    Current Outpatient Medications:  .  potassium chloride SA (KLOR-CON) 20 MEQ tablet, TAKE 3 TABLETS(60 MEQ) BY MOUTH DAILY, Disp: 270 tablet, Rfl: 3 .  acetaminophen (TYLENOL) 500 MG tablet, Take 1 tablet (500 mg total) by mouth every 6 (six) hours as needed., Disp: 30 tablet, Rfl: 0 .  albuterol (PROVENTIL HFA;VENTOLIN HFA) 108 (90 Base) MCG/ACT inhaler, Inhale 2 puffs into the lungs every 6 (six) hours as needed for wheezing or shortness of breath., Disp: 18 g, Rfl: 0 .  atorvastatin (LIPITOR) 40 MG tablet, TAKE 1 TABLET(40 MG) BY  MOUTH DAILY, Disp: 30 tablet, Rfl: 11 .  baclofen (LIORESAL) 10 MG tablet, TK 1 T PO BID PRN (Patient not taking: Reported on 08/16/2019), Disp: , Rfl:  .  benzonatate (TESSALON) 100 MG capsule, Take 1 capsule (100 mg total) by mouth every 8 (eight) hours. (Patient not taking: Reported on 08/16/2019), Disp: 21 capsule, Rfl: 0 .  carvedilol (COREG) 25 MG tablet, TAKE 1 TABLET(25 MG) BY MOUTH TWICE DAILY WITH A MEAL, Disp: 180 tablet, Rfl: 1 .  ELIQUIS 5 MG TABS tablet, TAKE 1 TABLET(5 MG) BY MOUTH TWICE DAILY, Disp: 60 tablet, Rfl: 1 .  furosemide (LASIX) 40 MG tablet, Take 1 tablet (40 mg total) by mouth 2 (two) times daily., Disp: 180 tablet, Rfl: 3 .  gabapentin (NEURONTIN) 100 MG capsule, TK 1 C PO TID, Disp: , Rfl:  .  glucose blood (FREESTYLE TEST STRIPS) test strip, Use as instructed, Disp: 100 each, Rfl: 0 .  glucose monitoring kit (FREESTYLE) monitoring kit, 1 each by Does not apply route 4 (four) times daily - after meals and at bedtime. 1 month Diabetic Testing Supplies for QAC-QHS accuchecks., Disp: 1 each, Rfl: 1 .  HUMALOG KWIKPEN 100 UNIT/ML KwikPen, INJECT 10 UNITS INTO THE SKIN WITH EACH MEAL, Disp: , Rfl:  .  insulin aspart (NOVOLOG FLEXPEN) 100 UNIT/ML FlexPen, 0-20 Units, Subcutaneous, 3 times daily with meals CBG < 70: implement hypoglycemia protocol CBG 70 - 120: 0 units CBG 121 - 150: 3 units CBG 151 - 200: 4 units CBG 201 - 250: 7 units CBG 251 - 300: 11 units CBG 301 - 350: 15 units  CBG 351 - 400: 20 units CBG > 400: call MD, Disp: 15 mL, Rfl: 0 .  Insulin Glargine (LANTUS SOLOSTAR) 100 UNIT/ML Solostar Pen, Inject 50 Units into the skin daily at 10 pm., Disp: 15 mL, Rfl: 0 .  Insulin Pen Needle 32G X 8 MM MISC, Use as directed, Disp: 100 each, Rfl: 0 .  isosorbide-hydrALAZINE (BIDIL) 20-37.5 MG tablet, Take 2 tablets by mouth 3 (three) times daily., Disp: 180 tablet, Rfl: 6 .  Lancets (FREESTYLE) lancets, Use as instructed, Disp: 100 each, Rfl: 0 .  metFORMIN (GLUCOPHAGE-XR) 500  MG 24 hr tablet, TK 2 TS PO BID (Patient not taking: Reported on 07/13/2019), Disp: , Rfl:  .  naproxen sodium (ALEVE) 220 MG tablet, Take 220 mg by mouth daily as needed (for pain or headache).  (Patient not taking: Reported on 07/13/2019), Disp: , Rfl:  .  Oxycodone HCl 10 MG TABS, Take 10 mg by mouth 4 (four) times daily as needed (for pain). (Patient not taking: Reported on 08/05/2019), Disp: , Rfl:  .  polyethylene glycol (MIRALAX / GLYCOLAX) packet, Take 17 g by mouth daily as needed for mild constipation., Disp: 14 each, Rfl: 0 .  sacubitril-valsartan (ENTRESTO) 97-103 MG, Take 1 tablet by mouth 2 (two) times daily., Disp: 180 tablet, Rfl: 3 .  spironolactone (ALDACTONE) 25 MG tablet, TAKE 1 TABLET(25 MG) BY MOUTH DAILY, Disp: 30 tablet, Rfl: 3 .  Tafamidis (VYNDAMAX) 61 MG CAPS, Take 61 mg by mouth daily., Disp: 90 capsule, Rfl: 3 Allergies  Allergen Reactions  . Morphine And Related Nausea Only    Severe nausea  . Penicillins Other (See Comments)    Pt states she has had a pain in her leg since a penicillin injection 2 months ago (reported 08/31/17).  Has tolerated amoxicillin oral.  Has patient had a PCN reaction causing immediate rash, facial/tongue/throat swelling, SOB or lightheadedness with hypotension: No Has patient had a PCN reaction causing severe rash involving mucus membranes or skin necrosis: No Has patient had a PCN reaction that required hospitalization: No Has patient had a PCN reaction occurring within the last 10 years: Yes If a      Social History   Socioeconomic History  . Marital status: Single    Spouse name: Not on file  . Number of children: 4  . Years of education: 68  . Highest education level: Not on file  Occupational History  . Not on file  Tobacco Use  . Smoking status: Former Smoker    Packs/day: 1.00    Years: 40.00    Pack years: 40.00    Types: Cigarettes    Start date: 06/23/1972    Quit date: 03/26/2012    Years since quitting: 7.4  .  Smokeless tobacco: Never Used  Vaping Use  . Vaping Use: Never assessed  Substance and Sexual Activity  . Alcohol use: No  . Drug use: No  . Sexual activity: Yes  Other Topics Concern  . Not on file  Social History Narrative  . Not on file   Social Determinants of Health   Financial Resource Strain:   . Difficulty of Paying Living Expenses:   Food Insecurity:   . Worried About Charity fundraiser in the Last Year:   . Arboriculturist in the Last Year:   Transportation Needs:   . Film/video editor (Medical):   Marland Kitchen Lack of Transportation (Non-Medical):   Physical Activity:   . Days of Exercise per Week:   .  Minutes of Exercise per Session:   Stress:   . Feeling of Stress :   Social Connections:   . Frequency of Communication with Friends and Family:   . Frequency of Social Gatherings with Friends and Family:   . Attends Religious Services:   . Active Member of Clubs or Organizations:   . Attends Archivist Meetings:   Marland Kitchen Marital Status:   Intimate Partner Violence:   . Fear of Current or Ex-Partner:   . Emotionally Abused:   Marland Kitchen Physically Abused:   . Sexually Abused:     Physical Exam Cardiovascular:     Rate and Rhythm: Normal rate and regular rhythm.     Pulses: Normal pulses.  Pulmonary:     Effort: Pulmonary effort is normal.     Breath sounds: Normal breath sounds.  Musculoskeletal:        General: Normal range of motion.     Right lower leg: Edema present.     Left lower leg: Edema present.  Skin:    General: Skin is warm and dry.     Capillary Refill: Capillary refill takes less than 2 seconds.  Neurological:     Mental Status: She is alert and oriented to person, place, and time.  Psychiatric:        Mood and Affect: Mood normal.         Future Appointments  Date Time Provider [place]  09/05/2019  8:05 AM [facility] DEVICE REMOTES [facility]  12/05/2019  8:05 AM [facility] DEVICE REMOTES [facility]  01/09/2020 10:00 AM Bernarda Caffey, MD [facility] None    BP (!) 171/87 (BP Location: Left Arm, Patient Position: Sitting, Cuff Size: Large)   Pulse 78   Resp 16   Wt (!) 235 lb (106.6 kg)   SpO2 99%   BMI 36.81 kg/m   Weight yesterday- did not weigh Last visit weight- 240 lb  [name] was seen at home today and reported feeling well. She denied chest pain, SOB, headache, dizziness, orthopnea fever or cough over the past week. She stated she has been compliant with her medications and her weight has been stable. Her medications were verified and her pillbox was refilled. I ordered entresto, eliquis and potassium which should be available to pick up tomorrow. I asked that [name] call me when she has them and I will return to finish her pillbox. She was understanding and agreeable.  I will follow up when she picks up her medications.    [name], EMT 08/23/19  ACTION: Home visit completed Next visit planned for later this week

## 2019-08-24 ENCOUNTER — Other Ambulatory Visit (HOSPITAL_COMMUNITY): Payer: Self-pay

## 2019-08-24 ENCOUNTER — Telehealth (HOSPITAL_COMMUNITY): Payer: Self-pay

## 2019-08-24 NOTE — Progress Notes (Signed)
I arrived at Ms [name] to finish filling her pillbox at the previously agreed upon time. She was not home so I called to check on her whereabouts. She stated she forgot I was coming (despite having spoken with me about 1 hour ago) and was at [employer]. I asked when she would be home and she stated she was still shopping. I will check back with her tomorrow.   [name], EMT 08/24/19

## 2019-08-24 NOTE — Telephone Encounter (Signed)
I called [name] to see if she had picked up her medications from the pharmacy. She stated she had them but was out shopping and would be home shortly. I asked if I could come by at 16:00 to add the medications to her box and she was agreeable.   [name], EMT 08/24/19

## 2019-08-25 ENCOUNTER — Other Ambulatory Visit (HOSPITAL_COMMUNITY): Payer: Self-pay

## 2019-08-25 NOTE — Progress Notes (Signed)
[name] was seen at home today to deliver her medications from the pharmacy. She reported feeling generally well, denying chest pain, SOB, headache, dizziness, orthopnea, fever or cough. Her medications, including Entresto, Eliquis and potassium were added to her pillbox and nothing further was necessary at this time. I will follow up next week.   [name], EMT 08/25/19

## 2019-08-30 ENCOUNTER — Other Ambulatory Visit (HOSPITAL_COMMUNITY): Payer: Self-pay

## 2019-08-30 NOTE — Progress Notes (Signed)
Paramedicine Encounter    Patient ID: [name], female    DOB: May 17, 1950, 69 y.o.   MRN: [id number]   Patient Care Team: System, Pcp Not In as PCP - General Jorge Ny, LCSW as Social Worker (Licensed Clinical Social Worker)  Patient Active Problem List   Diagnosis Date Noted   Right foot infection 10/22/2017   Cellulitis in diabetic foot ([place]) 10/22/2017   Hyperglycemia 10/22/2017   Lumbar radiculopathy 09/18/2017   Degenerative spondylolisthesis 09/18/2017   Atrial fibrillation ([place]) 02/11/2017   Paroxysmal A-fib ([facility])    Biventricular automatic implantable cardioverter defibrillator in situ    Sepsis ([place]) 04/20/2016   UTI (urinary tract infection) 04/20/2016   Dyspnea 07/19/2015   Interstitial lung disease ([place]) 11/20/2014   SIRS (systemic inflammatory response syndrome) ([place]) 02/03/2014   IDDM (insulin dependent diabetes mellitus) 02/03/2014   Tachycardia 02/33/4356   Chronic systolic CHF (congestive heart failure) ([place]) 09/29/2012   Hypoxia 03/06/2012   Hypertensive heart disease 03/06/2012   Nonischemic cardiomyopathy ([place]) 03/06/2012   Tobacco abuse 03/06/2012   Type 2 diabetes mellitus ([place]) 03/06/2012   Hypertension 03/06/2012   CAD (coronary artery disease), native coronary artery 03/06/2012   Hypokalemia 03/06/2012   Acute bronchitis 02/01/2009   Sleep apnea 02/01/2009   Chest pain 02/01/2009    Current Outpatient Medications:    albuterol (PROVENTIL HFA;VENTOLIN HFA) 108 (90 Base) MCG/ACT inhaler, Inhale 2 puffs into the lungs every 6 (six) hours as needed for wheezing or shortness of breath., Disp: 18 g, Rfl: 0   atorvastatin (LIPITOR) 40 MG tablet, TAKE 1 TABLET(40 MG) BY MOUTH DAILY, Disp: 30 tablet, Rfl: 11   carvedilol (COREG) 25 MG tablet, TAKE 1 TABLET(25 MG) BY MOUTH TWICE DAILY WITH A MEAL, Disp: 180 tablet, Rfl: 1   ELIQUIS 5 MG TABS tablet, TAKE 1 TABLET(5 MG) BY MOUTH TWICE DAILY, Disp: 60 tablet, Rfl: 1    furosemide (LASIX) 40 MG tablet, Take 1 tablet (40 mg total) by mouth 2 (two) times daily., Disp: 180 tablet, Rfl: 3   gabapentin (NEURONTIN) 100 MG capsule, TK 1 C PO TID, Disp: , Rfl:    insulin aspart (NOVOLOG FLEXPEN) 100 UNIT/ML FlexPen, 0-20 Units, Subcutaneous, 3 times daily with meals CBG < 70: implement hypoglycemia protocol CBG 70 - 120: 0 units CBG 121 - 150: 3 units CBG 151 - 200: 4 units CBG 201 - 250: 7 units CBG 251 - 300: 11 units CBG 301 - 350: 15 units CBG 351 - 400: 20 units CBG > 400: call MD, Disp: 15 mL, Rfl: 0   isosorbide-hydrALAZINE (BIDIL) 20-37.5 MG tablet, Take 2 tablets by mouth 3 (three) times daily., Disp: 180 tablet, Rfl: 6   metFORMIN (GLUCOPHAGE-XR) 500 MG 24 hr tablet, TK 2 TS PO BID, Disp: , Rfl:    potassium chloride SA (KLOR-CON) 20 MEQ tablet, TAKE 3 TABLETS(60 MEQ) BY MOUTH DAILY, Disp: 270 tablet, Rfl: 3   sacubitril-valsartan (ENTRESTO) 97-103 MG, Take 1 tablet by mouth 2 (two) times daily., Disp: 180 tablet, Rfl: 3   spironolactone (ALDACTONE) 25 MG tablet, TAKE 1 TABLET(25 MG) BY MOUTH DAILY, Disp: 30 tablet, Rfl: 3   Tafamidis (VYNDAMAX) 61 MG CAPS, Take 61 mg by mouth daily., Disp: 90 capsule, Rfl: 3   acetaminophen (TYLENOL) 500 MG tablet, Take 1 tablet (500 mg total) by mouth every 6 (six) hours as needed., Disp: 30 tablet, Rfl: 0   baclofen (LIORESAL) 10 MG tablet, TK 1 T PO BID PRN (Patient not taking:  Reported on 08/16/2019), Disp: , Rfl:    benzonatate (TESSALON) 100 MG capsule, Take 1 capsule (100 mg total) by mouth every 8 (eight) hours. (Patient not taking: Reported on 08/16/2019), Disp: 21 capsule, Rfl: 0   glucose blood (FREESTYLE TEST STRIPS) test strip, Use as instructed, Disp: 100 each, Rfl: 0   glucose monitoring kit (FREESTYLE) monitoring kit, 1 each by Does not apply route 4 (four) times daily - after meals and at bedtime. 1 month Diabetic Testing Supplies for QAC-QHS accuchecks., Disp: 1 each, Rfl: 1   HUMALOG KWIKPEN 100  UNIT/ML KwikPen, INJECT 10 UNITS INTO THE SKIN WITH EACH MEAL, Disp: , Rfl:    Insulin Glargine (LANTUS SOLOSTAR) 100 UNIT/ML Solostar Pen, Inject 50 Units into the skin daily at 10 pm., Disp: 15 mL, Rfl: 0   Insulin Pen Needle 32G X 8 MM MISC, Use as directed, Disp: 100 each, Rfl: 0   Lancets (FREESTYLE) lancets, Use as instructed, Disp: 100 each, Rfl: 0   naproxen sodium (ALEVE) 220 MG tablet, Take 220 mg by mouth daily as needed (for pain or headache).  (Patient not taking: Reported on 07/13/2019), Disp: , Rfl:    Oxycodone HCl 10 MG TABS, Take 10 mg by mouth 4 (four) times daily as needed (for pain). (Patient not taking: Reported on 08/05/2019), Disp: , Rfl:    polyethylene glycol (MIRALAX / GLYCOLAX) packet, Take 17 g by mouth daily as needed for mild constipation., Disp: 14 each, Rfl: 0 Allergies  Allergen Reactions   Morphine And Related Nausea Only    Severe nausea   Penicillins Other (See Comments)    Pt states she has had a pain in her leg since a penicillin injection 2 months ago (reported 08/31/17).  Has tolerated amoxicillin oral.  Has patient had a PCN reaction causing immediate rash, facial/tongue/throat swelling, SOB or lightheadedness with hypotension: No Has patient had a PCN reaction causing severe rash involving mucus membranes or skin necrosis: No Has patient had a PCN reaction that required hospitalization: No Has patient had a PCN reaction occurring within the last 10 years: Yes If a      Social History   Socioeconomic History   Marital status: Single    Spouse name: Not on file   Number of children: 4   Years of education: 11   Highest education level: Not on file  Occupational History   Not on file  Tobacco Use   Smoking status: Former Smoker    Packs/day: 1.00    Years: 40.00    Pack years: 40.00    Types: Cigarettes    Start date: 06/23/1972    Quit date: 03/26/2012    Years since quitting: 7.4   Smokeless tobacco: Never Used  Vaping Use    Vaping Use: Never assessed  Substance and Sexual Activity   Alcohol use: No   Drug use: No   Sexual activity: Yes  Other Topics Concern   Not on file  Social History Narrative   Not on file   Social Determinants of Health   Financial Resource Strain:    Difficulty of Paying Living Expenses:   Food Insecurity:    Worried About Charity fundraiser in the Last Year:    Arboriculturist in the Last Year:   Transportation Needs:    Film/video editor (Medical):    Lack of Transportation (Non-Medical):   Physical Activity:    Days of Exercise per Week:    Minutes of Exercise  per Session:   Stress:    Feeling of Stress :   Social Connections:    Frequency of Communication with Friends and Family:    Frequency of Social Gatherings with Friends and Family:    Attends Religious Services:    Active Member of Clubs or Organizations:    Attends Archivist Meetings:    Marital Status:   Intimate Partner Violence:    Fear of Current or Ex-Partner:    Emotionally Abused:    Physically Abused:    Sexually Abused:     Physical Exam Cardiovascular:     Rate and Rhythm: Normal rate and regular rhythm.     Pulses: Normal pulses.  Pulmonary:     Effort: Pulmonary effort is normal.     Breath sounds: Normal breath sounds.  Musculoskeletal:        General: Normal range of motion.     Right lower leg: No edema.     Left lower leg: No edema.  Skin:    General: Skin is warm and dry.     Capillary Refill: Capillary refill takes less than 2 seconds.  Neurological:     Mental Status: She is alert and oriented to person, place, and time.  Psychiatric:        Mood and Affect: Mood normal.         Future Appointments  Date Time Provider [place]  09/05/2019  8:05 AM [facility]  12/05/2019  8:05 AM [facility] DEVICE [facility]OT[facility]  01/09/2020 10:00 AM Bernarda Caffey, MD [facility]  None    BP 134/72 (BP Location: Left Arm, Patient Position: Sitting, Cuff Size: Large)    Pulse 85    Resp 18    Wt 235 lb (106.6 kg)    SpO2 99%    BMI 36.81 kg/m   Weight yesterday- did not weigh Last visit weight- 235 lb  [name] was seen at home today and reported feeling well. She denied chest pain, SOB, headache, dizziness, orthopnea, fever or cough over the past week. She stated she has been compliant with her medications over the past week and her weight has been stable. Her medications were verified and her pillboxes were refilled. She did not have enough Bidil to fill all of next week so I called in a refill and advised her to pick it up as soon as possible. [name] was understanding and agreeable. I will follow up next week.   [name], EMT 08/30/19  ACTION: Home visit completed Next visit planned for 1 week

## 2019-09-06 ENCOUNTER — Telehealth (HOSPITAL_COMMUNITY): Payer: Self-pay

## 2019-09-06 NOTE — Telephone Encounter (Signed)
I called [name] to see if she had picked up her medications from the pharmacy. She stated she would go now however upon her arrival found that they were not ready. She asked that I pick them up tomorrow and deliver them for her. I advised I would bring them early in the day and she was agreeable.   [name], EMT 09/06/19

## 2019-09-07 ENCOUNTER — Other Ambulatory Visit (HOSPITAL_COMMUNITY): Payer: Self-pay

## 2019-09-07 NOTE — Progress Notes (Signed)
Ms Seaberg was seen at home today and reported feeling well. The purpose of this visit was to deliver her Bidil and add it to her pillbox. Nothing further was needed at this time. I will follow up next week.   [name], EMT 09/07/19

## 2019-09-13 ENCOUNTER — Telehealth (HOSPITAL_COMMUNITY): Payer: Self-pay

## 2019-09-13 NOTE — Telephone Encounter (Signed)
I called [name] to nsee if she was available for an appointment this week. She stated she would be available tomorrow morning but asked that I pick up her medication from the pharmacy because she did not have a ride to pick it up over the weekend. I advised I would get the medication and be to her house around 10:00 tomorrow.   [name], EMT 09/13/19

## 2019-09-14 ENCOUNTER — Other Ambulatory Visit (HOSPITAL_COMMUNITY): Payer: Self-pay

## 2019-09-14 NOTE — Progress Notes (Signed)
Paramedicine Encounter    Patient ID: [name], female    DOB: Feb 21, 1950, 69 y.o.   MRN: [id number]   Patient Care Team: System, Pcp Not In as PCP - General [place], Connye Burkitt, LCSW as Social Worker (Licensed Holiday representative)  Patient Active Problem List   Diagnosis Date Noted  . Right foot infection 10/22/2017  . Cellulitis in diabetic foot ([place]) 10/22/2017  . Hyperglycemia 10/22/2017  . Lumbar radiculopathy 09/18/2017  . Degenerative spondylolisthesis 09/18/2017  . Atrial fibrillation ([place]) 02/11/2017  . Paroxysmal A-fib ([place])   . Biventricular automatic implantable cardioverter defibrillator in situ   . Sepsis ([place]) 04/20/2016  . UTI (urinary tract infection) 04/20/2016  . Dyspnea 07/19/2015  . Interstitial lung disease ([place]) 11/20/2014  . SIRS (systemic inflammatory response syndrome) ([place]) 02/03/2014  . IDDM (insulin dependent diabetes mellitus) 02/03/2014  . Tachycardia 06/14/2013  . Chronic systolic CHF (congestive heart failure) ([place]) 09/29/2012  . Hypoxia 03/06/2012  . Hypertensive heart disease 03/06/2012  . Nonischemic cardiomyopathy ([place]) 03/06/2012  . Tobacco abuse 03/06/2012  . Type 2 diabetes mellitus ([place]) 03/06/2012  . Hypertension 03/06/2012  . CAD (coronary artery disease), native coronary artery 03/06/2012  . Hypokalemia 03/06/2012  . Acute bronchitis 02/01/2009  . Sleep apnea 02/01/2009  . Chest pain 02/01/2009    Current Outpatient Medications:  .  acetaminophen (TYLENOL) 500 MG tablet, Take 1 tablet (500 mg total) by mouth every 6 (six) hours as needed., Disp: 30 tablet, Rfl: 0 .  albuterol (PROVENTIL HFA;VENTOLIN HFA) 108 (90 Base) MCG/ACT inhaler, Inhale 2 puffs into the lungs every 6 (six) hours as needed for wheezing or shortness of breath., Disp: 18 g, Rfl: 0 .  atorvastatin (LIPITOR) 40 MG tablet, TAKE 1 TABLET(40 MG) BY MOUTH DAILY, Disp: 30 tablet, Rfl: 11 .  carvedilol (COREG) 25 MG tablet, TAKE 1 TABLET(25 MG) BY MOUTH TWICE DAILY  WITH A MEAL, Disp: 180 tablet, Rfl: 1 .  ELIQUIS 5 MG TABS tablet, TAKE 1 TABLET(5 MG) BY MOUTH TWICE DAILY, Disp: 60 tablet, Rfl: 1 .  furosemide (LASIX) 40 MG tablet, Take 1 tablet (40 mg total) by mouth 2 (two) times daily., Disp: 180 tablet, Rfl: 3 .  gabapentin (NEURONTIN) 100 MG capsule, TK 1 C PO TID, Disp: , Rfl:  .  HUMALOG KWIKPEN 100 UNIT/ML KwikPen, INJECT 10 UNITS INTO THE SKIN WITH EACH MEAL, Disp: , Rfl:  .  isosorbide-hydrALAZINE (BIDIL) 20-37.5 MG tablet, Take 2 tablets by mouth 3 (three) times daily., Disp: 180 tablet, Rfl: 6 .  metFORMIN (GLUCOPHAGE-XR) 500 MG 24 hr tablet, TK 2 TS PO BID, Disp: , Rfl:  .  potassium chloride SA (KLOR-CON) 20 MEQ tablet, TAKE 3 TABLETS(60 MEQ) BY MOUTH DAILY, Disp: 270 tablet, Rfl: 3 .  sacubitril-valsartan (ENTRESTO) 97-103 MG, Take 1 tablet by mouth 2 (two) times daily., Disp: 180 tablet, Rfl: 3 .  spironolactone (ALDACTONE) 25 MG tablet, TAKE 1 TABLET(25 MG) BY MOUTH DAILY, Disp: 30 tablet, Rfl: 3 .  Tafamidis (VYNDAMAX) 61 MG CAPS, Take 61 mg by mouth daily., Disp: 90 capsule, Rfl: 3 .  baclofen (LIORESAL) 10 MG tablet, TK 1 T PO BID PRN (Patient not taking: Reported on 08/16/2019), Disp: , Rfl:  .  benzonatate (TESSALON) 100 MG capsule, Take 1 capsule (100 mg total) by mouth every 8 (eight) hours. (Patient not taking: Reported on 08/16/2019), Disp: 21 capsule, Rfl: 0 .  glucose blood (FREESTYLE TEST STRIPS) test strip, Use as instructed, Disp: 100 each, Rfl: 0 .  glucose monitoring kit (FREESTYLE) monitoring kit, 1 each by Does not apply route 4 (four) times daily - after meals and at bedtime. 1 month Diabetic Testing Supplies for QAC-QHS accuchecks., Disp: 1 each, Rfl: 1 .  insulin aspart (NOVOLOG FLEXPEN) 100 UNIT/ML FlexPen, 0-20 Units, Subcutaneous, 3 times daily with meals CBG < 70: implement hypoglycemia protocol CBG 70 - 120: 0 units CBG 121 - 150: 3 units CBG 151 - 200: 4 units CBG 201 - 250: 7 units CBG 251 - 300: 11 units CBG 301 - 350:  15 units CBG 351 - 400: 20 units CBG > 400: call MD, Disp: 15 mL, Rfl: 0 .  Insulin Glargine (LANTUS SOLOSTAR) 100 UNIT/ML Solostar Pen, Inject 50 Units into the skin daily at 10 pm., Disp: 15 mL, Rfl: 0 .  Insulin Pen Needle 32G X 8 MM MISC, Use as directed, Disp: 100 each, Rfl: 0 .  Lancets (FREESTYLE) lancets, Use as instructed, Disp: 100 each, Rfl: 0 .  naproxen sodium (ALEVE) 220 MG tablet, Take 220 mg by mouth daily as needed (for pain or headache).  (Patient not taking: Reported on 07/13/2019), Disp: , Rfl:  .  Oxycodone HCl 10 MG TABS, Take 10 mg by mouth 4 (four) times daily as needed (for pain). (Patient not taking: Reported on 08/05/2019), Disp: , Rfl:  .  polyethylene glycol (MIRALAX / GLYCOLAX) packet, Take 17 g by mouth daily as needed for mild constipation., Disp: 14 each, Rfl: 0 Allergies  Allergen Reactions  . Morphine And Related Nausea Only    Severe nausea  . Penicillins Other (See Comments)    Pt states she has had a pain in her leg since a penicillin injection 2 months ago (reported 08/31/17).  Has tolerated amoxicillin oral.  Has patient had a PCN reaction causing immediate rash, facial/tongue/throat swelling, SOB or lightheadedness with hypotension: No Has patient had a PCN reaction causing severe rash involving mucus membranes or skin necrosis: No Has patient had a PCN reaction that required hospitalization: No Has patient had a PCN reaction occurring within the last 10 years: Yes If a      Social History   Socioeconomic History  . Marital status: Single    Spouse name: Not on file  . Number of children: 4  . Years of education: 71  . Highest education level: Not on file  Occupational History  . Not on file  Tobacco Use  . Smoking status: Former Smoker    Packs/day: 1.00    Years: 40.00    Pack years: 40.00    Types: Cigarettes    Start date: 06/23/1972    Quit date: 03/26/2012    Years since quitting: 7.4  . Smokeless tobacco: Never Used  Vaping Use  .  Vaping Use: Never assessed  Substance and Sexual Activity  . Alcohol use: No  . Drug use: No  . Sexual activity: Yes  Other Topics Concern  . Not on file  Social History Narrative  . Not on file   Social Determinants of Health   Financial Resource Strain:   . Difficulty of Paying Living Expenses:   Food Insecurity:   . Worried About Charity fundraiser in the Last Year:   . Arboriculturist in the Last Year:   Transportation Needs:   . Film/video editor (Medical):   Marland Kitchen Lack of Transportation (Non-Medical):   Physical Activity:   . Days of Exercise per Week:   . Minutes of Exercise per  Session:   Stress:   . Feeling of Stress :   Social Connections:   . Frequency of Communication with Friends and Family:   . Frequency of Social Gatherings with Friends and Family:   . Attends Religious Services:   . Active Member of Clubs or Organizations:   . Attends Archivist Meetings:   Marland Kitchen Marital Status:   Intimate Partner Violence:   . Fear of Current or Ex-Partner:   . Emotionally Abused:   Marland Kitchen Physically Abused:   . Sexually Abused:     Physical Exam Cardiovascular:     Rate and Rhythm: Normal rate and regular rhythm.     Pulses: Normal pulses.  Pulmonary:     Effort: Pulmonary effort is normal.     Breath sounds: Normal breath sounds.  Musculoskeletal:        General: Normal range of motion.     Right lower leg: Edema present.     Left lower leg: Edema present.  Skin:    General: Skin is warm and dry.     Capillary Refill: Capillary refill takes less than 2 seconds.  Neurological:     Mental Status: She is alert and oriented to person, place, and time.  Psychiatric:        Mood and Affect: Mood normal.         Future Appointments  Date Time Provider [place]  12/05/2019  8:05 AM [facility]  01/09/2020 10:00 AM Bernarda Caffey, MD [facility] None    BP (!) 174/67 (BP Location: Left Arm, Patient Position:  Sitting, Cuff Size: Normal)   Pulse 87   Resp 16   Wt 236 lb 8 oz (107.3 kg)   SpO2 98%   BMI 37.04 kg/m   Weight yesterday- 235 lb Last visit weight- 235 lb  Ms Behrendt was seen at home today and reported feeling well. She denied chest pain, SOB, headache, dizziness, orthopnea, fever or cough over the past week. She stated she has been compliant with her medications over the past week and her weight has been stable. Upon assessing her vital signs she was found to be hypertensive but said she has just taken her medication minutes before I arrived. Her medications were verified and her pillbox was refilled. I will follow up in two weeks.   [name], EMT 09/14/19  ACTION: Home visit completed Next visit planned for 2 weeks

## 2019-09-30 ENCOUNTER — Telehealth (HOSPITAL_COMMUNITY): Payer: Self-pay | Admitting: Pharmacy Technician

## 2019-09-30 ENCOUNTER — Other Ambulatory Visit (HOSPITAL_COMMUNITY): Payer: Self-pay

## 2019-09-30 ENCOUNTER — Other Ambulatory Visit (HOSPITAL_COMMUNITY): Payer: Self-pay | Admitting: Cardiology

## 2019-09-30 ENCOUNTER — Other Ambulatory Visit (HOSPITAL_COMMUNITY): Payer: Self-pay | Admitting: *Deleted

## 2019-09-30 MED ORDER — VYNDAMAX 61 MG PO CAPS: 61.0000 mg | ORAL_CAPSULE | Freq: Every day | ORAL | 3 refills | Status: DC

## 2019-09-30 NOTE — Telephone Encounter (Signed)
Spoke with patient and paramedicine today. We are going to switch her Vyndamax from her current pharmacy to [place] [facility]. The prescription will be placed for a future fill date since she has some right now.  Patient is aware that the specialty pharmacy will call her once a month for a refill and Zack with paramedicine will help [place] facilitate this process if need be.  [name], CPhT

## 2019-09-30 NOTE — Progress Notes (Signed)
Paramedicine Encounter    Patient ID: [name], female    DOB: [birth year], 69 y.o.   MRN: [id number]   Patient Care Team: System, Pcp Not In as PCP - [facility] [place], Connye Burkitt, LCSW as Social Worker (Licensed Holiday representative)  Patient Active Problem List   Diagnosis Date Noted  . Right foot infection 10/22/2017  . Cellulitis in diabetic foot ([employer]) 10/22/2017  . Hyperglycemia 10/22/2017  . Lumbar radiculopathy 09/18/2017  . Degenerative spondylolisthesis 09/18/2017  . Atrial fibrillation ([place]) 02/11/2017  . Paroxysmal A-fib ([place])   . Biventricular automatic implantable cardioverter defibrillator in situ   . Sepsis ([place]) 04/20/2016  . UTI (urinary tract infection) 04/20/2016  . Dyspnea 07/19/2015  . Interstitial lung disease ([place]) 11/20/2014  . SIRS (systemic inflammatory response syndrome) ([place]) 02/03/2014  . IDDM (insulin dependent diabetes mellitus) 02/03/2014  . Tachycardia 06/14/2013  . Chronic systolic CHF (congestive heart failure) ([place]) 09/29/2012  . Hypoxia 03/06/2012  . Hypertensive heart disease 03/06/2012  . Nonischemic cardiomyopathy ([place]) 03/06/2012  . Tobacco abuse 03/06/2012  . Type 2 diabetes mellitus ([place]) 03/06/2012  . Hypertension 03/06/2012  . CAD (coronary artery disease), native coronary artery 03/06/2012  . Hypokalemia 03/06/2012  . Acute bronchitis 02/01/2009  . Sleep apnea 02/01/2009  . Chest pain 02/01/2009    Current Outpatient Medications:  .  acetaminophen (TYLENOL) 500 MG tablet, Take 1 tablet (500 mg total) by mouth every 6 (six) hours as needed., Disp: 30 tablet, Rfl: 0 .  albuterol (PROVENTIL HFA;VENTOLIN HFA) 108 (90 Base) MCG/ACT inhaler, Inhale 2 puffs into the lungs every 6 (six) hours as needed for wheezing or shortness of breath., Disp: 18 g, Rfl: 0 .  atorvastatin (LIPITOR) 40 MG tablet, TAKE 1 TABLET(40 MG) BY MOUTH DAILY, Disp: 30 tablet, Rfl: 11 .  baclofen (LIORESAL) 10 MG tablet, TK 1 T PO BID PRN (Patient not taking:  Reported on 08/16/2019), Disp: , Rfl:  .  benzonatate (TESSALON) 100 MG capsule, Take 1 capsule (100 mg total) by mouth every 8 (eight) hours. (Patient not taking: Reported on 08/16/2019), Disp: 21 capsule, Rfl: 0 .  carvedilol (COREG) 25 MG tablet, TAKE 1 TABLET(25 MG) BY MOUTH TWICE DAILY WITH A MEAL, Disp: 180 tablet, Rfl: 1 .  ELIQUIS 5 MG TABS tablet, TAKE 1 TABLET(5 MG) BY MOUTH TWICE DAILY, Disp: 60 tablet, Rfl: 1 .  furosemide (LASIX) 40 MG tablet, Take 1 tablet (40 mg total) by mouth 2 (two) times daily., Disp: 180 tablet, Rfl: 3 .  gabapentin (NEURONTIN) 100 MG capsule, TK 1 C PO TID, Disp: , Rfl:  .  glucose blood (FREESTYLE TEST STRIPS) test strip, Use as instructed, Disp: 100 each, Rfl: 0 .  glucose monitoring kit (FREESTYLE) monitoring kit, 1 each by Does not apply route 4 (four) times daily - after meals and at bedtime. 1 month Diabetic Testing Supplies for QAC-QHS accuchecks., Disp: 1 each, Rfl: 1 .  HUMALOG KWIKPEN 100 UNIT/ML KwikPen, INJECT 10 UNITS INTO THE SKIN WITH EACH MEAL, Disp: , Rfl:  .  insulin aspart (NOVOLOG FLEXPEN) 100 UNIT/ML FlexPen, 0-20 Units, Subcutaneous, 3 times daily with meals CBG < 70: implement hypoglycemia protocol CBG 70 - 120: 0 units CBG 121 - 150: 3 units CBG 151 - 200: 4 units CBG 201 - 250: 7 units CBG 251 - 300: 11 units CBG 301 - 350: 15 units CBG 351 - 400: 20 units CBG > 400: call MD, Disp: 15 mL, Rfl: 0 .  Insulin Glargine (LANTUS  SOLOSTAR) 100 UNIT/ML Solostar Pen, Inject 50 Units into the skin daily at 10 pm., Disp: 15 mL, Rfl: 0 .  Insulin Pen Needle 32G X 8 MM MISC, Use as directed, Disp: 100 each, Rfl: 0 .  isosorbide-hydrALAZINE (BIDIL) 20-37.5 MG tablet, Take 2 tablets by mouth 3 (three) times daily., Disp: 180 tablet, Rfl: 6 .  Lancets (FREESTYLE) lancets, Use as instructed, Disp: 100 each, Rfl: 0 .  metFORMIN (GLUCOPHAGE-XR) 500 MG 24 hr tablet, TK 2 TS PO BID, Disp: , Rfl:  .  naproxen sodium (ALEVE) 220 MG tablet, Take 220 mg by mouth  daily as needed (for pain or headache).  (Patient not taking: Reported on 07/13/2019), Disp: , Rfl:  .  Oxycodone HCl 10 MG TABS, Take 10 mg by mouth 4 (four) times daily as needed (for pain). (Patient not taking: Reported on 08/05/2019), Disp: , Rfl:  .  polyethylene glycol (MIRALAX / GLYCOLAX) packet, Take 17 g by mouth daily as needed for mild constipation., Disp: 14 each, Rfl: 0 .  potassium chloride SA (KLOR-CON) 20 MEQ tablet, TAKE 3 TABLETS(60 MEQ) BY MOUTH DAILY, Disp: 270 tablet, Rfl: 3 .  sacubitril-valsartan (ENTRESTO) 97-103 MG, Take 1 tablet by mouth 2 (two) times daily., Disp: 180 tablet, Rfl: 3 .  spironolactone (ALDACTONE) 25 MG tablet, TAKE 1 TABLET(25 MG) BY MOUTH DAILY, Disp: 30 tablet, Rfl: 3 .  Tafamidis (VYNDAMAX) 61 MG CAPS, Take 61 mg by mouth daily., Disp: 90 capsule, Rfl: 3 Allergies  Allergen Reactions  . Morphine And Related Nausea Only    Severe nausea  . Penicillins Other (See Comments)    Pt states she has had a pain in her leg since a penicillin injection 2 months ago (reported 08/31/17).  Has tolerated amoxicillin oral.  Has patient had a PCN reaction causing immediate rash, facial/tongue/throat swelling, SOB or lightheadedness with hypotension: No Has patient had a PCN reaction causing severe rash involving mucus membranes or skin necrosis: No Has patient had a PCN reaction that required hospitalization: No Has patient had a PCN reaction occurring within the last 10 years: Yes If a      Social History   Socioeconomic History  . Marital status: Single    Spouse name: Not on file  . Number of children: 4  . Years of education: 71  . Highest education level: Not on file  Occupational History  . Not on file  Tobacco Use  . Smoking status: Former Smoker    Packs/day: 1.00    Years: 40.00    Pack years: 40.00    Types: Cigarettes    Start date: 06/23/1972    Quit date: 03/26/2012    Years since quitting: 7.5  . Smokeless tobacco: Never Used  Vaping Use   . Vaping Use: Never assessed  Substance and Sexual Activity  . Alcohol use: No  . Drug use: No  . Sexual activity: Yes  Other Topics Concern  . Not on file  Social History Narrative  . Not on file   Social Determinants of Health   Financial Resource Strain:   . Difficulty of Paying Living Expenses: Not on file  Food Insecurity:   . Worried About Charity fundraiser in the Last Year: Not on file  . Ran Out of Food in the Last Year: Not on file  Transportation Needs:   . Lack of Transportation (Medical): Not on file  . Lack of Transportation (Non-Medical): Not on file  Physical Activity:   . Days  of Exercise per Week: Not on file  . Minutes of Exercise per Session: Not on file  Stress:   . Feeling of Stress : Not on file  Social Connections:   . Frequency of Communication with Friends and Family: Not on file  . Frequency of Social Gatherings with Friends and Family: Not on file  . Attends Religious Services: Not on file  . Active Member of Clubs or Organizations: Not on file  . Attends Archivist Meetings: Not on file  . Marital Status: Not on file  Intimate Partner Violence:   . Fear of Current or Ex-Partner: Not on file  . Emotionally Abused: Not on file  . Physically Abused: Not on file  . Sexually Abused: Not on file    Physical Exam Cardiovascular:     Rate and Rhythm: Normal rate and regular rhythm.     Pulses: Normal pulses.  Pulmonary:     Effort: Pulmonary effort is normal.     Breath sounds: Normal breath sounds.  Musculoskeletal:        General: Normal range of motion.     Right lower leg: Edema present.     Left lower leg: Edema present.  Skin:    General: Skin is warm and dry.     Capillary Refill: Capillary refill takes less than 2 seconds.  Neurological:     Mental Status: She is alert and oriented to person, place, and time.  Psychiatric:        Mood and Affect: Mood normal.         Future Appointments  Date Time Provider  [place]  12/05/2019  8:05 AM [facility] DEVICE REMOTES [facility]  01/09/2020 10:00 AM Bernarda Caffey, MD [facility] None    BP (!) 170/98 (BP Location: Left Arm, Patient Position: Sitting, Cuff Size: Normal)   Pulse 87   Resp 16   Wt 235 lb (106.6 kg)   SpO2 98%   BMI 36.81 kg/m   Weight yesterday- 240 lb Last visit weight- 236 lb  Ms Klugh was seen at home today and reported feeling well. She denied chest pain, SOB, headache, dizziness, orthopnea, fever or cough over the past week. She stated she has been compliant with her medications however she had missed two days worth at some point based off the number of days since our last visit and her finish her pillbox today. Her weight is relatively stable however she exhibits BLEE and is hypertensive. She stated she ate hotdogs yesterday which could be the cause of her swelling. She was instructed to avoid these foods moving forward and she agreed. Her medications were verified and her pillbox was refilled. I will follow up next week.   [name], EMT 09/30/19  ACTION: Home visit completed Next visit planned for 1 week

## 2019-10-07 ENCOUNTER — Other Ambulatory Visit (HOSPITAL_COMMUNITY): Payer: Self-pay

## 2019-10-07 NOTE — Progress Notes (Signed)
Paramedicine Encounter    Patient ID: [name], female    DOB: [birth year], 69 y.o.   MRN: [id number]   Patient Care Team: System, Pcp Not In as PCP - [facility] [place], Connye Burkitt, LCSW as Social Worker (Licensed [employer] representative)  Patient Active Problem List   Diagnosis Date Noted  . Right foot infection 10/22/2017  . Cellulitis in diabetic foot ([place]) 10/22/2017  . Hyperglycemia 10/22/2017  . Lumbar radiculopathy 09/18/2017  . Degenerative spondylolisthesis 09/18/2017  . Atrial fibrillation ([place]) 02/11/2017  . Paroxysmal A-fib ([place])   . Biventricular automatic implantable cardioverter defibrillator in situ   . Sepsis ([place]) 04/20/2016  . UTI (urinary tract infection) 04/20/2016  . Dyspnea 07/19/2015  . Interstitial lung disease ([place]) 11/20/2014  . SIRS (systemic inflammatory response syndrome) ([place]) 02/03/2014  . IDDM (insulin dependent diabetes mellitus) 02/03/2014  . Tachycardia 06/14/2013  . Chronic systolic CHF (congestive heart failure) ([place]) 09/29/2012  . Hypoxia 03/06/2012  . Hypertensive heart disease 03/06/2012  . Nonischemic cardiomyopathy ([place]) 03/06/2012  . Tobacco abuse 03/06/2012  . Type 2 diabetes mellitus ([place]) 03/06/2012  . Hypertension 03/06/2012  . CAD (coronary artery disease), native coronary artery 03/06/2012  . Hypokalemia 03/06/2012  . Acute bronchitis 02/01/2009  . Sleep apnea 02/01/2009  . Chest pain 02/01/2009    Current Outpatient Medications:  .  acetaminophen (TYLENOL) 500 MG tablet, Take 1 tablet (500 mg total) by mouth every 6 (six) hours as needed., Disp: 30 tablet, Rfl: 0 .  albuterol (PROVENTIL HFA;VENTOLIN HFA) 108 (90 Base) MCG/ACT inhaler, Inhale 2 puffs into the lungs every 6 (six) hours as needed for wheezing or shortness of breath., Disp: 18 g, Rfl: 0 .  atorvastatin (LIPITOR) 40 MG tablet, TAKE 1 TABLET(40 MG) BY MOUTH DAILY, Disp: 30 tablet, Rfl: 11 .  benzonatate (TESSALON) 100 MG capsule, Take 1 capsule (100 mg total) by  mouth every 8 (eight) hours., Disp: 21 capsule, Rfl: 0 .  carvedilol (COREG) 25 MG tablet, TAKE 1 TABLET(25 MG) BY MOUTH TWICE DAILY WITH A MEAL, Disp: 180 tablet, Rfl: 1 .  ELIQUIS 5 MG TABS tablet, TAKE 1 TABLET(5 MG) BY MOUTH TWICE DAILY, Disp: 60 tablet, Rfl: 1 .  furosemide (LASIX) 40 MG tablet, Take 1 tablet (40 mg total) by mouth 2 (two) times daily., Disp: 180 tablet, Rfl: 3 .  gabapentin (NEURONTIN) 100 MG capsule, TK 1 C PO TID, Disp: , Rfl:  .  glucose blood (FREESTYLE TEST STRIPS) test strip, Use as instructed, Disp: 100 each, Rfl: 0 .  glucose monitoring kit (FREESTYLE) monitoring kit, 1 each by Does not apply route 4 (four) times daily - after meals and at bedtime. 1 month Diabetic Testing Supplies for QAC-QHS accuchecks., Disp: 1 each, Rfl: 1 .  HUMALOG KWIKPEN 100 UNIT/ML KwikPen, INJECT 10 UNITS INTO THE SKIN WITH EACH MEAL, Disp: , Rfl:  .  isosorbide-hydrALAZINE (BIDIL) 20-37.5 MG tablet, Take 2 tablets by mouth 3 (three) times daily., Disp: 180 tablet, Rfl: 6 .  metFORMIN (GLUCOPHAGE-XR) 500 MG 24 hr tablet, TK 2 TS PO BID, Disp: , Rfl:  .  polyethylene glycol (MIRALAX / GLYCOLAX) packet, Take 17 g by mouth daily as needed for mild constipation., Disp: 14 each, Rfl: 0 .  potassium chloride SA (KLOR-CON) 20 MEQ tablet, TAKE 3 TABLETS(60 MEQ) BY MOUTH DAILY, Disp: 270 tablet, Rfl: 3 .  sacubitril-valsartan (ENTRESTO) 97-103 MG, Take 1 tablet by mouth 2 (two) times daily., Disp: 180 tablet, Rfl: 3 .  spironolactone (ALDACTONE) 25  MG tablet, TAKE 1 TABLET(25 MG) BY MOUTH DAILY, Disp: 30 tablet, Rfl: 3 .  Tafamidis (VYNDAMAX) 61 MG CAPS, Take 61 mg by mouth daily., Disp: 90 capsule, Rfl: 3 .  baclofen (LIORESAL) 10 MG tablet, TK 1 T PO BID PRN (Patient not taking: Reported on 08/16/2019), Disp: , Rfl:  .  insulin aspart (NOVOLOG FLEXPEN) 100 UNIT/ML FlexPen, 0-20 Units, Subcutaneous, 3 times daily with meals CBG < 70: implement hypoglycemia protocol CBG 70 - 120: 0 units CBG 121 - 150:  3 units CBG 151 - 200: 4 units CBG 201 - 250: 7 units CBG 251 - 300: 11 units CBG 301 - 350: 15 units CBG 351 - 400: 20 units CBG > 400: call MD, Disp: 15 mL, Rfl: 0 .  Insulin Glargine (LANTUS SOLOSTAR) 100 UNIT/ML Solostar Pen, Inject 50 Units into the skin daily at 10 pm., Disp: 15 mL, Rfl: 0 .  Insulin Pen Needle 32G X 8 MM MISC, Use as directed, Disp: 100 each, Rfl: 0 .  Lancets (FREESTYLE) lancets, Use as instructed, Disp: 100 each, Rfl: 0 .  naproxen sodium (ALEVE) 220 MG tablet, Take 220 mg by mouth daily as needed (for pain or headache).  (Patient not taking: Reported on 07/13/2019), Disp: , Rfl:  .  Oxycodone HCl 10 MG TABS, Take 10 mg by mouth 4 (four) times daily as needed (for pain). (Patient not taking: Reported on 08/05/2019), Disp: , Rfl:  Allergies  Allergen Reactions  . Morphine And Related Nausea Only    Severe nausea  . Penicillins Other (See Comments)    Pt states she has had a pain in her leg since a penicillin injection 2 months ago (reported 08/31/17).  Has tolerated amoxicillin oral.  Has patient had a PCN reaction causing immediate rash, facial/tongue/throat swelling, SOB or lightheadedness with hypotension: No Has patient had a PCN reaction causing severe rash involving mucus membranes or skin necrosis: No Has patient had a PCN reaction that required hospitalization: No Has patient had a PCN reaction occurring within the last 10 years: Yes If a      Social History   Socioeconomic History  . Marital status: Single    Spouse name: Not on file  . Number of children: 4  . Years of education: 37  . Highest education level: Not on file  Occupational History  . Not on file  Tobacco Use  . Smoking status: Former Smoker    Packs/day: 1.00    Years: 40.00    Pack years: 40.00    Types: Cigarettes    Start date: 06/23/1972    Quit date: 03/26/2012    Years since quitting: 7.5  . Smokeless tobacco: Never Used  Vaping Use  . Vaping Use: Never assessed  Substance and  Sexual Activity  . Alcohol use: No  . Drug use: No  . Sexual activity: Yes  Other Topics Concern  . Not on file  Social History Narrative  . Not on file   Social Determinants of Health   Financial Resource Strain:   . Difficulty of Paying Living Expenses: Not on file  Food Insecurity:   . Worried About Charity fundraiser in the Last Year: Not on file  . Ran Out of Food in the Last Year: Not on file  Transportation Needs:   . Lack of Transportation (Medical): Not on file  . Lack of Transportation (Non-Medical): Not on file  Physical Activity:   . Days of Exercise per Week: Not on  file  . Minutes of Exercise per Session: Not on file  Stress:   . Feeling of Stress : Not on file  Social Connections:   . Frequency of Communication with Friends and Family: Not on file  . Frequency of Social Gatherings with Friends and Family: Not on file  . Attends Religious Services: Not on file  . Active Member of Clubs or Organizations: Not on file  . Attends Archivist Meetings: Not on file  . Marital Status: Not on file  Intimate Partner Violence:   . Fear of Current or Ex-Partner: Not on file  . Emotionally Abused: Not on file  . Physically Abused: Not on file  . Sexually Abused: Not on file    Physical Exam Cardiovascular:     Rate and Rhythm: Normal rate and regular rhythm.     Pulses: Normal pulses.  Pulmonary:     Effort: Pulmonary effort is normal.     Breath sounds: Normal breath sounds.  Musculoskeletal:        General: Normal range of motion.     Right lower leg: Edema present.     Left lower leg: Edema present.  Skin:    General: Skin is warm and dry.     Capillary Refill: Capillary refill takes less than 2 seconds.  Neurological:     Mental Status: She is alert and oriented to person, place, and time.  Psychiatric:        Mood and Affect: Mood normal.         Future Appointments  Date Time Provider [place]  12/05/2019  8:05 AM [facility]  DEVICE REMOTES [facility]  01/09/2020 10:00 AM Bernarda Caffey, MD [facility] None    BP (!) 170/94 (BP Location: Left Arm, Patient Position: Sitting, Cuff Size: Normal)   Pulse 90   Resp 17   Wt 240 lb (108.9 kg)   SpO2 98%   BMI 37.59 kg/m   Weight yesterday- 240 lb Last visit weight- 235 lb  Ms Schuff was seen at home today and rpeorted feeling generally well. She denied chest pain, SOB, headache, dizziness, orthopnea, fever or cough over the past week. She stated she has been compliant with her medications but I found several tablets of carvedilol mixed in with lasix in the lasix bottle. I asked about this but she denied having put them there. She also continues to eat foods high in sodium like hotdogs and bologna. Her weight is up as well as her blood pressure today which is a result of shotty medication compliance and poor dietary choices. We continue to discuss this at each visit however little progress has been made. Her medications were verified and her pillbox was refilled. I will follow up when I return from vacation in two weeks.   [name], EMT 10/07/19  ACTION: Home visit completed Next visit planned for 2 weeks

## 2019-10-21 ENCOUNTER — Other Ambulatory Visit (HOSPITAL_COMMUNITY): Payer: Self-pay

## 2019-10-21 NOTE — Progress Notes (Signed)
Paramedicine Encounter    Patient ID: [name], female    DOB: [birth year], 69 y.o.   MRN: [id number]   Patient Care Team: Pcp, No as PCP - General Bebe Liter Connye Burkitt, LCSW as Social Worker (Licensed [employer] representative)  Patient Active Problem List   Diagnosis Date Noted  . Right foot infection 10/22/2017  . Cellulitis in diabetic foot ([place]) 10/22/2017  . Hyperglycemia 10/22/2017  . Lumbar radiculopathy 09/18/2017  . Degenerative spondylolisthesis 09/18/2017  . Atrial fibrillation ([place]) 02/11/2017  . Paroxysmal A-fib ([place])   . Biventricular automatic implantable cardioverter defibrillator in situ   . Sepsis ([place]) 04/20/2016  . UTI (urinary tract infection) 04/20/2016  . Dyspnea 07/19/2015  . Interstitial lung disease ([place]) 11/20/2014  . SIRS (systemic inflammatory response syndrome) ([place]) 02/03/2014  . IDDM (insulin dependent diabetes mellitus) 02/03/2014  . Tachycardia 06/14/2013  . Chronic systolic CHF (congestive heart failure) ([place]) 09/29/2012  . Hypoxia 03/06/2012  . Hypertensive heart disease 03/06/2012  . Nonischemic cardiomyopathy ([place]) 03/06/2012  . Tobacco abuse 03/06/2012  . Type 2 diabetes mellitus ([place]) 03/06/2012  . Hypertension 03/06/2012  . CAD (coronary artery disease), native coronary artery 03/06/2012  . Hypokalemia 03/06/2012  . Acute bronchitis 02/01/2009  . Sleep apnea 02/01/2009  . Chest pain 02/01/2009    Current Outpatient Medications:  .  acetaminophen (TYLENOL) 500 MG tablet, Take 1 tablet (500 mg total) by mouth every 6 (six) hours as needed., Disp: 30 tablet, Rfl: 0 .  albuterol (PROVENTIL HFA;VENTOLIN HFA) 108 (90 Base) MCG/ACT inhaler, Inhale 2 puffs into the lungs every 6 (six) hours as needed for wheezing or shortness of breath., Disp: 18 g, Rfl: 0 .  atorvastatin (LIPITOR) 40 MG tablet, TAKE 1 TABLET(40 MG) BY MOUTH DAILY, Disp: 30 tablet, Rfl: 11 .  baclofen (LIORESAL) 10 MG tablet, TK 1 T PO BID PRN (Patient not taking: Reported on  08/16/2019), Disp: , Rfl:  .  benzonatate (TESSALON) 100 MG capsule, Take 1 capsule (100 mg total) by mouth every 8 (eight) hours., Disp: 21 capsule, Rfl: 0 .  carvedilol (COREG) 25 MG tablet, TAKE 1 TABLET(25 MG) BY MOUTH TWICE DAILY WITH A MEAL, Disp: 180 tablet, Rfl: 1 .  ELIQUIS 5 MG TABS tablet, TAKE 1 TABLET(5 MG) BY MOUTH TWICE DAILY, Disp: 60 tablet, Rfl: 1 .  furosemide (LASIX) 40 MG tablet, Take 1 tablet (40 mg total) by mouth 2 (two) times daily., Disp: 180 tablet, Rfl: 3 .  gabapentin (NEURONTIN) 100 MG capsule, TK 1 C PO TID, Disp: , Rfl:  .  glucose blood (FREESTYLE TEST STRIPS) test strip, Use as instructed, Disp: 100 each, Rfl: 0 .  glucose monitoring kit (FREESTYLE) monitoring kit, 1 each by Does not apply route 4 (four) times daily - after meals and at bedtime. 1 month Diabetic Testing Supplies for QAC-QHS accuchecks., Disp: 1 each, Rfl: 1 .  HUMALOG KWIKPEN 100 UNIT/ML KwikPen, INJECT 10 UNITS INTO THE SKIN WITH EACH MEAL, Disp: , Rfl:  .  insulin aspart (NOVOLOG FLEXPEN) 100 UNIT/ML FlexPen, 0-20 Units, Subcutaneous, 3 times daily with meals CBG < 70: implement hypoglycemia protocol CBG 70 - 120: 0 units CBG 121 - 150: 3 units CBG 151 - 200: 4 units CBG 201 - 250: 7 units CBG 251 - 300: 11 units CBG 301 - 350: 15 units CBG 351 - 400: 20 units CBG > 400: call MD, Disp: 15 mL, Rfl: 0 .  Insulin Glargine (LANTUS SOLOSTAR) 100 UNIT/ML Solostar Pen, Inject 50 Units  into the skin daily at 10 pm., Disp: 15 mL, Rfl: 0 .  Insulin Pen Needle 32G X 8 MM MISC, Use as directed, Disp: 100 each, Rfl: 0 .  isosorbide-hydrALAZINE (BIDIL) 20-37.5 MG tablet, Take 2 tablets by mouth 3 (three) times daily., Disp: 180 tablet, Rfl: 6 .  Lancets (FREESTYLE) lancets, Use as instructed, Disp: 100 each, Rfl: 0 .  metFORMIN (GLUCOPHAGE-XR) 500 MG 24 hr tablet, TK 2 TS PO BID, Disp: , Rfl:  .  naproxen sodium (ALEVE) 220 MG tablet, Take 220 mg by mouth daily as needed (for pain or headache).  (Patient not  taking: Reported on 07/13/2019), Disp: , Rfl:  .  Oxycodone HCl 10 MG TABS, Take 10 mg by mouth 4 (four) times daily as needed (for pain). (Patient not taking: Reported on 08/05/2019), Disp: , Rfl:  .  polyethylene glycol (MIRALAX / GLYCOLAX) packet, Take 17 g by mouth daily as needed for mild constipation., Disp: 14 each, Rfl: 0 .  potassium chloride SA (KLOR-CON) 20 MEQ tablet, TAKE 3 TABLETS(60 MEQ) BY MOUTH DAILY, Disp: 270 tablet, Rfl: 3 .  sacubitril-valsartan (ENTRESTO) 97-103 MG, Take 1 tablet by mouth 2 (two) times daily., Disp: 180 tablet, Rfl: 3 .  spironolactone (ALDACTONE) 25 MG tablet, TAKE 1 TABLET(25 MG) BY MOUTH DAILY, Disp: 30 tablet, Rfl: 3 .  Tafamidis (VYNDAMAX) 61 MG CAPS, Take 61 mg by mouth daily., Disp: 90 capsule, Rfl: 3 Allergies  Allergen Reactions  . Morphine And Related Nausea Only    Severe nausea  . Penicillins Other (See Comments)    Pt states she has had a pain in her leg since a penicillin injection 2 months ago (reported 08/31/17).  Has tolerated amoxicillin oral.  Has patient had a PCN reaction causing immediate rash, facial/tongue/throat swelling, SOB or lightheadedness with hypotension: No Has patient had a PCN reaction causing severe rash involving mucus membranes or skin necrosis: No Has patient had a PCN reaction that required hospitalization: No Has patient had a PCN reaction occurring within the last 10 years: Yes If a      Social History   Socioeconomic History  . Marital status: Single    Spouse name: Not on file  . Number of children: 4  . Years of education: 63  . Highest education level: Not on file  Occupational History  . Not on file  Tobacco Use  . Smoking status: Former Smoker    Packs/day: 1.00    Years: 40.00    Pack years: 40.00    Types: Cigarettes    Start date: 06/23/1972    Quit date: 03/26/2012    Years since quitting: 7.5  . Smokeless tobacco: Never Used  Vaping Use  . Vaping Use: Never assessed  Substance and Sexual  Activity  . Alcohol use: No  . Drug use: No  . Sexual activity: Yes  Other Topics Concern  . Not on file  Social History Narrative  . Not on file   Social Determinants of Health   Financial Resource Strain:   . Difficulty of Paying Living Expenses: Not on file  Food Insecurity:   . Worried About Charity fundraiser in the Last Year: Not on file  . Ran Out of Food in the Last Year: Not on file  Transportation Needs:   . Lack of Transportation (Medical): Not on file  . Lack of Transportation (Non-Medical): Not on file  Physical Activity:   . Days of Exercise per Week: Not on file  .  Minutes of Exercise per Session: Not on file  Stress:   . Feeling of Stress : Not on file  Social Connections:   . Frequency of Communication with Friends and Family: Not on file  . Frequency of Social Gatherings with Friends and Family: Not on file  . Attends Religious Services: Not on file  . Active Member of Clubs or Organizations: Not on file  . Attends Archivist Meetings: Not on file  . Marital Status: Not on file  Intimate Partner Violence:   . Fear of Current or Ex-Partner: Not on file  . Emotionally Abused: Not on file  . Physically Abused: Not on file  . Sexually Abused: Not on file    Physical Exam Cardiovascular:     Rate and Rhythm: Normal rate and regular rhythm.     Pulses: Normal pulses.  Pulmonary:     Effort: Pulmonary effort is normal.     Breath sounds: Normal breath sounds.  Musculoskeletal:        General: Normal range of motion.     Right lower leg: No edema.     Left lower leg: No edema.  Skin:    General: Skin is warm and dry.     Capillary Refill: Capillary refill takes less than 2 seconds.  Neurological:     Mental Status: She is alert and oriented to person, place, and time.  Psychiatric:        Mood and Affect: Mood normal.         Future Appointments  Date Time Provider [place]  12/05/2019  8:05 AM [facility]OT[facility]  [facility]  01/09/2020 10:00 AM Bernarda Caffey, MD [facility] None    BP (!) 148/84 (BP Location: Left Arm, Patient Position: Sitting, Cuff Size: Large)   Pulse 90   Resp 16   Wt 234 lb (106.1 kg)   SpO2 95%   BMI 36.65 kg/m   Weight yesterday- 235 lb Last visit weight- 240 lb  Ms Deakins was seen at home today and reported feeling well. She denied chest pain, SOB, headache, dizziness, orthopnea, fever or cough. She reported being compliant with her medications over the past two weeks but she had just woken up so she had not taken them yet today. Her medications were verified and her pillbox was refilled. I will follow up next week.   [name], EMT 10/21/19  ACTION: Home visit completed Next visit planned for 1 week

## 2019-10-28 ENCOUNTER — Other Ambulatory Visit (HOSPITAL_COMMUNITY): Payer: Self-pay

## 2019-10-28 ENCOUNTER — Other Ambulatory Visit (HOSPITAL_COMMUNITY): Payer: Self-pay | Admitting: *Deleted

## 2019-10-28 MED ORDER — CARVEDILOL 25 MG PO TABS: ORAL_TABLET | ORAL | 1 refills | Status: DC

## 2019-10-28 MED ORDER — FUROSEMIDE 40 MG PO TABS: 40.0000 mg | ORAL_TABLET | Freq: Two times a day (BID) | ORAL | 3 refills | Status: DC

## 2019-10-28 MED ORDER — SPIRONOLACTONE 25 MG PO TABS: ORAL_TABLET | ORAL | 3 refills | Status: DC

## 2019-10-28 NOTE — Progress Notes (Signed)
Paramedicine Encounter    Patient ID: [name], female    DOB: Jul 05, 1950, 69 y.o.   MRN: [id number]   Patient Care Team: Pcp, No as PCP - General Bebe Liter Connye Burkitt, LCSW as Social Worker (Licensed [employer] representative)  Patient Active Problem List   Diagnosis Date Noted  . Right foot infection 10/22/2017  . Cellulitis in diabetic foot ([place]) 10/22/2017  . Hyperglycemia 10/22/2017  . Lumbar radiculopathy 09/18/2017  . Degenerative spondylolisthesis 09/18/2017  . Atrial fibrillation ([place]) 02/11/2017  . Paroxysmal A-fib ([place])   . Biventricular automatic implantable cardioverter defibrillator in situ   . Sepsis ([place]) 04/20/2016  . UTI (urinary tract infection) 04/20/2016  . Dyspnea 07/19/2015  . Interstitial lung disease ([place]) 11/20/2014  . SIRS (systemic inflammatory response syndrome) ([place]) 02/03/2014  . IDDM (insulin dependent diabetes mellitus) 02/03/2014  . Tachycardia 06/14/2013  . Chronic systolic CHF (congestive heart failure) ([place]) 09/29/2012  . Hypoxia 03/06/2012  . Hypertensive heart disease 03/06/2012  . Nonischemic cardiomyopathy ([place]) 03/06/2012  . Tobacco abuse 03/06/2012  . Type 2 diabetes mellitus ([place]) 03/06/2012  . Hypertension 03/06/2012  . CAD (coronary artery disease), native coronary artery 03/06/2012  . Hypokalemia 03/06/2012  . Acute bronchitis 02/01/2009  . Sleep apnea 02/01/2009  . Chest pain 02/01/2009    Current Outpatient Medications:  .  acetaminophen (TYLENOL) 500 MG tablet, Take 1 tablet (500 mg total) by mouth every 6 (six) hours as needed., Disp: 30 tablet, Rfl: 0 .  albuterol (PROVENTIL HFA;VENTOLIN HFA) 108 (90 Base) MCG/ACT inhaler, Inhale 2 puffs into the lungs every 6 (six) hours as needed for wheezing or shortness of breath., Disp: 18 g, Rfl: 0 .  atorvastatin (LIPITOR) 40 MG tablet, TAKE 1 TABLET(40 MG) BY MOUTH DAILY, Disp: 30 tablet, Rfl: 11 .  baclofen (LIORESAL) 10 MG tablet, TK 1 T PO BID PRN (Patient not taking: Reported on  08/16/2019), Disp: , Rfl:  .  benzonatate (TESSALON) 100 MG capsule, Take 1 capsule (100 mg total) by mouth every 8 (eight) hours. (Patient not taking: Reported on 10/21/2019), Disp: 21 capsule, Rfl: 0 .  carvedilol (COREG) 25 MG tablet, TAKE 1 TABLET(25 MG) BY MOUTH TWICE DAILY WITH A MEAL, Disp: 180 tablet, Rfl: 1 .  ELIQUIS 5 MG TABS tablet, TAKE 1 TABLET(5 MG) BY MOUTH TWICE DAILY, Disp: 60 tablet, Rfl: 1 .  furosemide (LASIX) 40 MG tablet, Take 1 tablet (40 mg total) by mouth 2 (two) times daily., Disp: 180 tablet, Rfl: 3 .  gabapentin (NEURONTIN) 100 MG capsule, TK 1 C PO TID, Disp: , Rfl:  .  glucose blood (FREESTYLE TEST STRIPS) test strip, Use as instructed, Disp: 100 each, Rfl: 0 .  glucose monitoring kit (FREESTYLE) monitoring kit, 1 each by Does not apply route 4 (four) times daily - after meals and at bedtime. 1 month Diabetic Testing Supplies for QAC-QHS accuchecks., Disp: 1 each, Rfl: 1 .  HUMALOG KWIKPEN 100 UNIT/ML KwikPen, INJECT 10 UNITS INTO THE SKIN WITH EACH MEAL, Disp: , Rfl:  .  insulin aspart (NOVOLOG FLEXPEN) 100 UNIT/ML FlexPen, 0-20 Units, Subcutaneous, 3 times daily with meals CBG < 70: implement hypoglycemia protocol CBG 70 - 120: 0 units CBG 121 - 150: 3 units CBG 151 - 200: 4 units CBG 201 - 250: 7 units CBG 251 - 300: 11 units CBG 301 - 350: 15 units CBG 351 - 400: 20 units CBG > 400: call MD (Patient not taking: Reported on 10/21/2019), Disp: 15 mL, Rfl: 0 .  Insulin Glargine (LANTUS SOLOSTAR) 100 UNIT/ML Solostar Pen, Inject 50 Units into the skin daily at 10 pm., Disp: 15 mL, Rfl: 0 .  Insulin Pen Needle 32G X 8 MM MISC, Use as directed, Disp: 100 each, Rfl: 0 .  isosorbide-hydrALAZINE (BIDIL) 20-37.5 MG tablet, Take 2 tablets by mouth 3 (three) times daily., Disp: 180 tablet, Rfl: 6 .  Lancets (FREESTYLE) lancets, Use as instructed, Disp: 100 each, Rfl: 0 .  metFORMIN (GLUCOPHAGE-XR) 500 MG 24 hr tablet, TK 2 TS PO BID, Disp: , Rfl:  .  naproxen sodium (ALEVE) 220 MG  tablet, Take 220 mg by mouth daily as needed (for pain or headache).  (Patient not taking: Reported on 07/13/2019), Disp: , Rfl:  .  Oxycodone HCl 10 MG TABS, Take 10 mg by mouth 4 (four) times daily as needed (for pain). (Patient not taking: Reported on 08/05/2019), Disp: , Rfl:  .  polyethylene glycol (MIRALAX / GLYCOLAX) packet, Take 17 g by mouth daily as needed for mild constipation. (Patient not taking: Reported on 10/21/2019), Disp: 14 each, Rfl: 0 .  potassium chloride SA (KLOR-CON) 20 MEQ tablet, TAKE 3 TABLETS(60 MEQ) BY MOUTH DAILY, Disp: 270 tablet, Rfl: 3 .  sacubitril-valsartan (ENTRESTO) 97-103 MG, Take 1 tablet by mouth 2 (two) times daily., Disp: 180 tablet, Rfl: 3 .  spironolactone (ALDACTONE) 25 MG tablet, TAKE 1 TABLET(25 MG) BY MOUTH DAILY, Disp: 30 tablet, Rfl: 3 .  Tafamidis (VYNDAMAX) 61 MG CAPS, Take 61 mg by mouth daily., Disp: 90 capsule, Rfl: 3 Allergies  Allergen Reactions  . Morphine And Related Nausea Only    Severe nausea  . Penicillins Other (See Comments)    Pt states she has had a pain in her leg since a penicillin injection 2 months ago (reported 08/31/17).  Has tolerated amoxicillin oral.  Has patient had a PCN reaction causing immediate rash, facial/tongue/throat swelling, SOB or lightheadedness with hypotension: No Has patient had a PCN reaction causing severe rash involving mucus membranes or skin necrosis: No Has patient had a PCN reaction that required hospitalization: No Has patient had a PCN reaction occurring within the last 10 years: Yes If a      Social History   Socioeconomic History  . Marital status: Single    Spouse name: Not on file  . Number of children: 4  . Years of education: 19  . Highest education level: Not on file  Occupational History  . Not on file  Tobacco Use  . Smoking status: Former Smoker    Packs/day: 1.00    Years: 40.00    Pack years: 40.00    Types: Cigarettes    Start date: 06/23/1972    Quit date: 03/26/2012     Years since quitting: 7.5  . Smokeless tobacco: Never Used  Vaping Use  . Vaping Use: Never assessed  Substance and Sexual Activity  . Alcohol use: No  . Drug use: No  . Sexual activity: Yes  Other Topics Concern  . Not on file  Social History Narrative  . Not on file   Social Determinants of Health   Financial Resource Strain:   . Difficulty of Paying Living Expenses: Not on file  Food Insecurity:   . Worried About Charity fundraiser in the Last Year: Not on file  . Ran Out of Food in the Last Year: Not on file  Transportation Needs:   . Lack of Transportation (Medical): Not on file  . Lack of Transportation (Non-Medical): Not  on file  Physical Activity:   . Days of Exercise per Week: Not on file  . Minutes of Exercise per Session: Not on file  Stress:   . Feeling of Stress : Not on file  Social Connections:   . Frequency of Communication with Friends and Family: Not on file  . Frequency of Social Gatherings with Friends and Family: Not on file  . Attends Religious Services: Not on file  . Active Member of Clubs or Organizations: Not on file  . Attends Archivist Meetings: Not on file  . Marital Status: Not on file  Intimate Partner Violence:   . Fear of Current or Ex-Partner: Not on file  . Emotionally Abused: Not on file  . Physically Abused: Not on file  . Sexually Abused: Not on file    Physical Exam Cardiovascular:     Rate and Rhythm: Normal rate and regular rhythm.     Pulses: Normal pulses.  Pulmonary:     Effort: Pulmonary effort is normal.     Breath sounds: Normal breath sounds.  Musculoskeletal:        General: Normal range of motion.     Right lower leg: Edema present.     Left lower leg: Edema present.  Skin:    General: Skin is warm and dry.     Capillary Refill: Capillary refill takes less than 2 seconds.  Neurological:     Mental Status: She is alert and oriented to person, place, and time.  Psychiatric:        Mood and Affect:  Mood normal.         Future Appointments  Date Time Provider [place]  12/05/2019  8:05 AM [facility]  01/09/2020 10:00 AM Bernarda Caffey, MD [facility] None    BP 134/67 (BP Location: Left Arm, Patient Position: Sitting, Cuff Size: Normal)   Pulse 90   Resp 18   Wt 240 lb (108.9 kg)   SpO2 98%   BMI 37.59 kg/m   Weight yesterday- n/a Last visit weight- 235 lb  Ms Chatwin was seen at home today and reported feeling well. She denied chest pain, SOB, headache, dizziness, orthopnea, fever or cough over the pats week. She reported being compliant with her medications but her weight has increased. Upon verifying her medications I noted that she did not have the refill of carvedilol which she picked up last week. She believes she threw this away so I got a cash price from the pharmacy for a 90 day supply. The advised they would fill it for her for $10 out of pocket. Mr Kraska stated she would pick it up this weekend and she had enough to get through the next week. Her medications were verified and her pillbox was refilled. I will follow up next week.   [name], EMT 10/28/19  ACTION: Home visit completed Next visit planned for 1 week

## 2019-11-04 ENCOUNTER — Telehealth (HOSPITAL_COMMUNITY): Payer: Self-pay

## 2019-11-04 NOTE — Telephone Encounter (Signed)
I called Ms Graffam to see if she was available for me to come make an addition to her pillbox. She did not answer so I left a message requesting she call me back. I will try again later today.  [name], EMT 11/04/19

## 2019-11-04 NOTE — Telephone Encounter (Signed)
I called [name] again to see if she was available. She did not answer so I left another voicemail. I will follow up next week.   [name], EMT 11/04/19

## 2019-11-07 ENCOUNTER — Other Ambulatory Visit (HOSPITAL_COMMUNITY): Payer: Self-pay

## 2019-11-07 NOTE — Progress Notes (Signed)
Ms Dino was seen today to add some medications to her pillbox. She had picked up spironolactone and carvedilol over the weekend but was afraid to put it in her box for fear of doing something wrong. I added the medications to her pillbox but will need to return tomorrow for a full visit.   [name], EMT 11/07/19

## 2019-11-09 ENCOUNTER — Other Ambulatory Visit (HOSPITAL_COMMUNITY): Payer: Self-pay

## 2019-11-09 ENCOUNTER — Other Ambulatory Visit (HOSPITAL_COMMUNITY): Payer: Self-pay | Admitting: *Deleted

## 2019-11-09 MED ORDER — POTASSIUM CHLORIDE CRYS ER 20 MEQ PO TBCR: EXTENDED_RELEASE_TABLET | ORAL | 3 refills | Status: DC

## 2019-11-09 MED FILL — VYNDAMAX 61 MG CAPS: 61 | 30 days supply | Qty: 30 | Fill #0

## 2019-11-09 NOTE — Progress Notes (Signed)
Paramedicine Encounter    Patient ID: [name], female    DOB: November 15, 1950, 69 y.o.   MRN: [id number]   Patient Care Team: Pcp, No as PCP - General Jorge Ny, LCSW as Social Worker (Licensed Holiday representative)  Patient Active Problem List   Diagnosis Date Noted   Right foot infection 10/22/2017   Cellulitis in diabetic foot ([place]) 10/22/2017   Hyperglycemia 10/22/2017   Lumbar radiculopathy 09/18/2017   Degenerative spondylolisthesis 09/18/2017   Atrial fibrillation ([place]) 02/11/2017   Paroxysmal A-fib ([facility])    Biventricular automatic implantable cardioverter defibrillator in situ    Sepsis ([place]) 04/20/2016   UTI (urinary tract infection) 04/20/2016   Dyspnea 07/19/2015   Interstitial lung disease ([place]) 11/20/2014   SIRS (systemic inflammatory response syndrome) ([place]) 02/03/2014   IDDM (insulin dependent diabetes mellitus) 02/03/2014   Tachycardia 51/02/5850   Chronic systolic CHF (congestive heart failure) ([place]) 09/29/2012   Hypoxia 03/06/2012   Hypertensive heart disease 03/06/2012   Nonischemic cardiomyopathy ([place]) 03/06/2012   Tobacco abuse 03/06/2012   Type 2 diabetes mellitus ([place]) 03/06/2012   Hypertension 03/06/2012   CAD (coronary artery disease), native coronary artery 03/06/2012   Hypokalemia 03/06/2012   Acute bronchitis 02/01/2009   Sleep apnea 02/01/2009   Chest pain 02/01/2009    Current Outpatient Medications:    acetaminophen (TYLENOL) 500 MG tablet, Take 1 tablet (500 mg total) by mouth every 6 (six) hours as needed., Disp: 30 tablet, Rfl: 0   albuterol (PROVENTIL HFA;VENTOLIN HFA) 108 (90 Base) MCG/ACT inhaler, Inhale 2 puffs into the lungs every 6 (six) hours as needed for wheezing or shortness of breath., Disp: 18 g, Rfl: 0   atorvastatin (LIPITOR) 40 MG tablet, TAKE 1 TABLET(40 MG) BY MOUTH DAILY, Disp: 30 tablet, Rfl: 11   carvedilol (COREG) 25 MG tablet, TAKE 1 TABLET(25 MG) BY MOUTH TWICE DAILY WITH A MEAL,  Disp: 180 tablet, Rfl: 1   ELIQUIS 5 MG TABS tablet, TAKE 1 TABLET(5 MG) BY MOUTH TWICE DAILY, Disp: 60 tablet, Rfl: 1   furosemide (LASIX) 40 MG tablet, Take 1 tablet (40 mg total) by mouth 2 (two) times daily., Disp: 180 tablet, Rfl: 3   gabapentin (NEURONTIN) 100 MG capsule, TK 1 C PO TID, Disp: , Rfl:    glucose blood (FREESTYLE TEST STRIPS) test strip, Use as instructed, Disp: 100 each, Rfl: 0   glucose monitoring kit (FREESTYLE) monitoring kit, 1 each by Does not apply route 4 (four) times daily - after meals and at bedtime. 1 month Diabetic Testing Supplies for QAC-QHS accuchecks., Disp: 1 each, Rfl: 1   HUMALOG KWIKPEN 100 UNIT/ML KwikPen, INJECT 10 UNITS INTO THE SKIN WITH EACH MEAL, Disp: , Rfl:    Insulin Glargine (LANTUS SOLOSTAR) 100 UNIT/ML Solostar Pen, Inject 50 Units into the skin daily at 10 pm., Disp: 15 mL, Rfl: 0   Insulin Pen Needle 32G X 8 MM MISC, Use as directed, Disp: 100 each, Rfl: 0   isosorbide-hydrALAZINE (BIDIL) 20-37.5 MG tablet, Take 2 tablets by mouth 3 (three) times daily., Disp: 180 tablet, Rfl: 6   Lancets (FREESTYLE) lancets, Use as instructed, Disp: 100 each, Rfl: 0   metFORMIN (GLUCOPHAGE-XR) 500 MG 24 hr tablet, TK 2 TS PO BID, Disp: , Rfl:    sacubitril-valsartan (ENTRESTO) 97-103 MG, Take 1 tablet by mouth 2 (two) times daily., Disp: 180 tablet, Rfl: 3   spironolactone (ALDACTONE) 25 MG tablet, TAKE 1 TABLET(25 MG) BY MOUTH DAILY, Disp: 90 tablet, Rfl: 3  Tafamidis (VYNDAMAX) 61 MG CAPS, Take 61 mg by mouth daily., Disp: 90 capsule, Rfl: 3   baclofen (LIORESAL) 10 MG tablet, TK 1 T PO BID PRN (Patient not taking: Reported on 08/16/2019), Disp: , Rfl:    benzonatate (TESSALON) 100 MG capsule, Take 1 capsule (100 mg total) by mouth every 8 (eight) hours. (Patient not taking: Reported on 10/21/2019), Disp: 21 capsule, Rfl: 0   insulin aspart (NOVOLOG FLEXPEN) 100 UNIT/ML FlexPen, 0-20 Units, Subcutaneous, 3 times daily with meals CBG < 70:  implement hypoglycemia protocol CBG 70 - 120: 0 units CBG 121 - 150: 3 units CBG 151 - 200: 4 units CBG 201 - 250: 7 units CBG 251 - 300: 11 units CBG 301 - 350: 15 units CBG 351 - 400: 20 units CBG > 400: call MD (Patient not taking: Reported on 10/21/2019), Disp: 15 mL, Rfl: 0   naproxen sodium (ALEVE) 220 MG tablet, Take 220 mg by mouth daily as needed (for pain or headache).  (Patient not taking: Reported on 07/13/2019), Disp: , Rfl:    Oxycodone HCl 10 MG TABS, Take 10 mg by mouth 4 (four) times daily as needed (for pain). (Patient not taking: Reported on 08/05/2019), Disp: , Rfl:    polyethylene glycol (MIRALAX / GLYCOLAX) packet, Take 17 g by mouth daily as needed for mild constipation. (Patient not taking: Reported on 10/21/2019), Disp: 14 each, Rfl: 0   potassium chloride SA (KLOR-CON) 20 MEQ tablet, TAKE 3 TABLETS(60 MEQ) BY MOUTH DAILY, Disp: 270 tablet, Rfl: 3 Allergies  Allergen Reactions   Morphine And Related Nausea Only    Severe nausea   Penicillins Other (See Comments)    Pt states she has had a pain in her leg since a penicillin injection 2 months ago (reported 08/31/17).  Has tolerated amoxicillin oral.  Has patient had a PCN reaction causing immediate rash, facial/tongue/throat swelling, SOB or lightheadedness with hypotension: No Has patient had a PCN reaction causing severe rash involving mucus membranes or skin necrosis: No Has patient had a PCN reaction that required hospitalization: No Has patient had a PCN reaction occurring within the last 10 years: Yes If a      Social History   Socioeconomic History   Marital status: Single    Spouse name: Not on file   Number of children: 4   Years of education: 11   Highest education level: Not on file  Occupational History   Not on file  Tobacco Use   Smoking status: Former Smoker    Packs/day: 1.00    Years: 40.00    Pack years: 40.00    Types: Cigarettes    Start date: 06/23/1972    Quit date: 03/26/2012     Years since quitting: 7.6   Smokeless tobacco: Never Used  Vaping Use   Vaping Use: Never assessed  Substance and Sexual Activity   Alcohol use: No   Drug use: No   Sexual activity: Yes  Other Topics Concern   Not on file  Social History Narrative   Not on file   Social Determinants of Health   Financial Resource Strain:    Difficulty of Paying Living Expenses: Not on file  Food Insecurity:    Worried About Charity fundraiser in the Last Year: Not on file   [employer] of Food in the Last Year: Not on file  Transportation Needs:    Lack of Transportation (Medical): Not on file   Lack of Transportation (Non-Medical): Not  on file  Physical Activity:    Days of Exercise per Week: Not on file   Minutes of Exercise per Session: Not on file  Stress:    Feeling of Stress : Not on file  Social Connections:    Frequency of Communication with Friends and Family: Not on file   Frequency of Social Gatherings with Friends and Family: Not on file   Attends Religious Services: Not on file   Active Member of Clubs or Organizations: Not on file   Attends Archivist Meetings: Not on file   Marital Status: Not on file  Intimate Partner Violence:    Fear of Current or Ex-Partner: Not on file   Emotionally Abused: Not on file   Physically Abused: Not on file   Sexually Abused: Not on file    Physical Exam Cardiovascular:     Rate and Rhythm: Normal rate and regular rhythm.     Pulses: Normal pulses.  Pulmonary:     Effort: Pulmonary effort is normal.     Breath sounds: Normal breath sounds.  Musculoskeletal:        General: Normal range of motion.     Right lower leg: No edema.     Left lower leg: No edema.  Skin:    General: Skin is warm and dry.     Capillary Refill: Capillary refill takes less than 2 seconds.  Neurological:     Mental Status: She is alert and oriented to person, place, and time.  Psychiatric:        Mood and Affect: Mood  normal.         Future Appointments  Date Time Provider [place]  12/05/2019  8:05 AM [facility]  01/09/2020 10:00 AM Bernarda Caffey, MD [facility] None    BP 124/85 (BP Location: Left Arm, Patient Position: Sitting, Cuff Size: Large)    Pulse 85    Resp 16    Wt 235 lb 8 oz (106.8 kg)    SpO2 98%    BMI 36.88 kg/m   Weight yesterday- did not weigh Last visit weight- 240 lb  Ms Welshans was seen at home today and reported feeling well. She denied chest pain, SOB, headache, dizziness, orthopnea, fever or cough. She reported being compliant with her medications over the past week and her weight has been stable. Her medications were verified and her pillbox was refilled.   [name], EMT 11/09/19  ACTION: Home visit completed Next visit planned for 1 week

## 2019-11-15 ENCOUNTER — Other Ambulatory Visit (HOSPITAL_COMMUNITY): Payer: Self-pay

## 2019-11-15 NOTE — Progress Notes (Signed)
Paramedicine Encounter    Patient ID: [name], female    DOB: Oct 20, 1950, 69 y.o.   MRN: [id number]   Patient Care Team: Pcp, No as PCP - General Bebe Liter Connye Burkitt, LCSW as Social Worker (Licensed Holiday representative)  Patient Active Problem List   Diagnosis Date Noted  . Right foot infection 10/22/2017  . Cellulitis in diabetic foot ([place]) 10/22/2017  . Hyperglycemia 10/22/2017  . Lumbar radiculopathy 09/18/2017  . Degenerative spondylolisthesis 09/18/2017  . Atrial fibrillation ([place]) 02/11/2017  . Paroxysmal A-fib ([place])   . Biventricular automatic implantable cardioverter defibrillator in situ   . Sepsis ([place]) 04/20/2016  . UTI (urinary tract infection) 04/20/2016  . Dyspnea 07/19/2015  . Interstitial lung disease ([place]) 11/20/2014  . SIRS (systemic inflammatory response syndrome) ([place]) 02/03/2014  . IDDM (insulin dependent diabetes mellitus) 02/03/2014  . Tachycardia 06/14/2013  . Chronic systolic CHF (congestive heart failure) ([place]) 09/29/2012  . Hypoxia 03/06/2012  . Hypertensive heart disease 03/06/2012  . Nonischemic cardiomyopathy ([place]) 03/06/2012  . Tobacco abuse 03/06/2012  . Type 2 diabetes mellitus ([place]) 03/06/2012  . Hypertension 03/06/2012  . CAD (coronary artery disease), native coronary artery 03/06/2012  . Hypokalemia 03/06/2012  . Acute bronchitis 02/01/2009  . Sleep apnea 02/01/2009  . Chest pain 02/01/2009    Current Outpatient Medications:  .  acetaminophen (TYLENOL) 500 MG tablet, Take 1 tablet (500 mg total) by mouth every 6 (six) hours as needed., Disp: 30 tablet, Rfl: 0 .  albuterol (PROVENTIL HFA;VENTOLIN HFA) 108 (90 Base) MCG/ACT inhaler, Inhale 2 puffs into the lungs every 6 (six) hours as needed for wheezing or shortness of breath., Disp: 18 g, Rfl: 0 .  atorvastatin (LIPITOR) 40 MG tablet, TAKE 1 TABLET(40 MG) BY MOUTH DAILY, Disp: 30 tablet, Rfl: 11 .  baclofen (LIORESAL) 10 MG tablet, TK 1 T PO BID PRN (Patient not taking: Reported on  08/16/2019), Disp: , Rfl:  .  benzonatate (TESSALON) 100 MG capsule, Take 1 capsule (100 mg total) by mouth every 8 (eight) hours. (Patient not taking: Reported on 10/21/2019), Disp: 21 capsule, Rfl: 0 .  carvedilol (COREG) 25 MG tablet, TAKE 1 TABLET(25 MG) BY MOUTH TWICE DAILY WITH A MEAL, Disp: 180 tablet, Rfl: 1 .  ELIQUIS 5 MG TABS tablet, TAKE 1 TABLET(5 MG) BY MOUTH TWICE DAILY, Disp: 60 tablet, Rfl: 1 .  furosemide (LASIX) 40 MG tablet, Take 1 tablet (40 mg total) by mouth 2 (two) times daily., Disp: 180 tablet, Rfl: 3 .  gabapentin (NEURONTIN) 100 MG capsule, TK 1 C PO TID, Disp: , Rfl:  .  glucose blood (FREESTYLE TEST STRIPS) test strip, Use as instructed, Disp: 100 each, Rfl: 0 .  glucose monitoring kit (FREESTYLE) monitoring kit, 1 each by Does not apply route 4 (four) times daily - after meals and at bedtime. 1 month Diabetic Testing Supplies for QAC-QHS accuchecks., Disp: 1 each, Rfl: 1 .  HUMALOG KWIKPEN 100 UNIT/ML KwikPen, INJECT 10 UNITS INTO THE SKIN WITH EACH MEAL, Disp: , Rfl:  .  insulin aspart (NOVOLOG FLEXPEN) 100 UNIT/ML FlexPen, 0-20 Units, Subcutaneous, 3 times daily with meals CBG < 70: implement hypoglycemia protocol CBG 70 - 120: 0 units CBG 121 - 150: 3 units CBG 151 - 200: 4 units CBG 201 - 250: 7 units CBG 251 - 300: 11 units CBG 301 - 350: 15 units CBG 351 - 400: 20 units CBG > 400: call MD (Patient not taking: Reported on 10/21/2019), Disp: 15 mL, Rfl: 0 .  Insulin Glargine (LANTUS SOLOSTAR) 100 UNIT/ML Solostar Pen, Inject 50 Units into the skin daily at 10 pm., Disp: 15 mL, Rfl: 0 .  Insulin Pen Needle 32G X 8 MM MISC, Use as directed, Disp: 100 each, Rfl: 0 .  isosorbide-hydrALAZINE (BIDIL) 20-37.5 MG tablet, Take 2 tablets by mouth 3 (three) times daily., Disp: 180 tablet, Rfl: 6 .  Lancets (FREESTYLE) lancets, Use as instructed, Disp: 100 each, Rfl: 0 .  metFORMIN (GLUCOPHAGE-XR) 500 MG 24 hr tablet, TK 2 TS PO BID, Disp: , Rfl:  .  naproxen sodium (ALEVE) 220 MG  tablet, Take 220 mg by mouth daily as needed (for pain or headache).  (Patient not taking: Reported on 07/13/2019), Disp: , Rfl:  .  Oxycodone HCl 10 MG TABS, Take 10 mg by mouth 4 (four) times daily as needed (for pain). (Patient not taking: Reported on 08/05/2019), Disp: , Rfl:  .  polyethylene glycol (MIRALAX / GLYCOLAX) packet, Take 17 g by mouth daily as needed for mild constipation. (Patient not taking: Reported on 10/21/2019), Disp: 14 each, Rfl: 0 .  potassium chloride SA (KLOR-CON) 20 MEQ tablet, TAKE 3 TABLETS(60 MEQ) BY MOUTH DAILY, Disp: 270 tablet, Rfl: 3 .  sacubitril-valsartan (ENTRESTO) 97-103 MG, Take 1 tablet by mouth 2 (two) times daily., Disp: 180 tablet, Rfl: 3 .  spironolactone (ALDACTONE) 25 MG tablet, TAKE 1 TABLET(25 MG) BY MOUTH DAILY, Disp: 90 tablet, Rfl: 3 .  Tafamidis (VYNDAMAX) 61 MG CAPS, Take 61 mg by mouth daily., Disp: 90 capsule, Rfl: 3 Allergies  Allergen Reactions  . Morphine And Related Nausea Only    Severe nausea  . Penicillins Other (See Comments)    Pt states she has had a pain in her leg since a penicillin injection 2 months ago (reported 08/31/17).  Has tolerated amoxicillin oral.  Has patient had a PCN reaction causing immediate rash, facial/tongue/throat swelling, SOB or lightheadedness with hypotension: No Has patient had a PCN reaction causing severe rash involving mucus membranes or skin necrosis: No Has patient had a PCN reaction that required hospitalization: No Has patient had a PCN reaction occurring within the last 10 years: Yes If a      Social History   Socioeconomic History  . Marital status: Single    Spouse name: Not on file  . Number of children: 4  . Years of education: 89  . Highest education level: Not on file  Occupational History  . Not on file  Tobacco Use  . Smoking status: Former Smoker    Packs/day: 1.00    Years: 40.00    Pack years: 40.00    Types: Cigarettes    Start date: 06/23/1972    Quit date: 03/26/2012     Years since quitting: 7.6  . Smokeless tobacco: Never Used  Vaping Use  . Vaping Use: Never assessed  Substance and Sexual Activity  . Alcohol use: No  . Drug use: No  . Sexual activity: Yes  Other Topics Concern  . Not on file  Social History Narrative  . Not on file   Social Determinants of Health   Financial Resource Strain:   . Difficulty of Paying Living Expenses: Not on file  Food Insecurity:   . Worried About Charity fundraiser in the Last Year: Not on file  . Ran Out of Food in the Last Year: Not on file  Transportation Needs:   . Lack of Transportation (Medical): Not on file  . Lack of Transportation (Non-Medical): Not  on file  Physical Activity:   . Days of Exercise per Week: Not on file  . Minutes of Exercise per Session: Not on file  Stress:   . Feeling of Stress : Not on file  Social Connections:   . Frequency of Communication with Friends and Family: Not on file  . Frequency of Social Gatherings with Friends and Family: Not on file  . Attends Religious Services: Not on file  . Active Member of Clubs or Organizations: Not on file  . Attends Archivist Meetings: Not on file  . Marital Status: Not on file  Intimate Partner Violence:   . Fear of Current or Ex-Partner: Not on file  . Emotionally Abused: Not on file  . Physically Abused: Not on file  . Sexually Abused: Not on file    Physical Exam Cardiovascular:     Rate and Rhythm: Normal rate and regular rhythm.     Pulses: Normal pulses.  Pulmonary:     Effort: Pulmonary effort is normal.     Breath sounds: Normal breath sounds.  Musculoskeletal:        General: Normal range of motion.     Right lower leg: No edema.     Left lower leg: No edema.  Skin:    General: Skin is warm and dry.     Capillary Refill: Capillary refill takes less than 2 seconds.  Neurological:     Mental Status: She is alert and oriented to person, place, and time.  Psychiatric:        Mood and Affect: Mood  normal.         Future Appointments  Date Time Provider [place]  12/05/2019  8:05 AM [facility]  01/09/2020 10:00 AM Bernarda Caffey, MD [facility] None    BP (!) 158/88 (BP Location: Left Arm, Patient Position: Sitting, Cuff Size: Large)   Pulse 87   Resp 18   Wt 238 lb (108 kg)   SpO2 97%   BMI 37.28 kg/m   Weight yesterday- did not weigh Last visit weight- 235 lb  Ms Kitchens was seen at home today and reported feeling well. She denied chest pain, SOB, headache, dizziness, orthopnea, fever or cough over the past week. She stated she has been compliant with her medications over the past week and her weight has been stable. She will need to pick up metformin and entresto from the pharmacy but has enough to make it through Wednesday of next week. I will follow up at that time to finish her pillboxes.   [name], EMT 11/15/19  ACTION: Home visit completed Next visit planned for 1 week

## 2019-11-22 ENCOUNTER — Telehealth (HOSPITAL_COMMUNITY): Payer: Self-pay

## 2019-11-22 NOTE — Telephone Encounter (Signed)
Ms Nylund to schedule an appointment. She did not answer so I left a message requesting she call me back.   [name], EMT 11/22/19

## 2019-11-23 ENCOUNTER — Other Ambulatory Visit (HOSPITAL_COMMUNITY): Payer: Self-pay

## 2019-11-23 NOTE — Progress Notes (Signed)
[name] was seen at home today and reported feeling well. She denied chest pain, SOB. Headache, dizziness, orthopnea, fever or cough over the past week. The purpose of this visit was to deliver medications from the pharmacy and add them to her pillbox. While I was there I also got her scheduled for a follow up appointment at the HF clinic for which she was overdue. She did not need anything further at this time. I will follow up next week.   [name], EMT 11/23/19

## 2019-11-29 ENCOUNTER — Other Ambulatory Visit (HOSPITAL_COMMUNITY): Payer: Self-pay

## 2019-11-29 NOTE — Progress Notes (Signed)
Paramedicine Encounter    Patient ID: [name], female    DOB: 1950/09/03, 69 y.o.   MRN: [id number]   Patient Care Team: Pcp, No as PCP - General Bebe Liter Connye Burkitt, LCSW as Social Worker (Licensed Holiday representative)  Patient Active Problem List   Diagnosis Date Noted  . Right foot infection 10/22/2017  . Cellulitis in diabetic foot ([place]) 10/22/2017  . Hyperglycemia 10/22/2017  . Lumbar radiculopathy 09/18/2017  . Degenerative spondylolisthesis 09/18/2017  . Atrial fibrillation ([place]) 02/11/2017  . Paroxysmal A-fib ([place])   . Biventricular automatic implantable cardioverter defibrillator in situ   . Sepsis ([place]) 04/20/2016  . UTI (urinary tract infection) 04/20/2016  . Dyspnea 07/19/2015  . Interstitial lung disease ([place]) 11/20/2014  . SIRS (systemic inflammatory response syndrome) ([place]) 02/03/2014  . IDDM (insulin dependent diabetes mellitus) 02/03/2014  . Tachycardia 06/14/2013  . Chronic systolic CHF (congestive heart failure) ([place]) 09/29/2012  . Hypoxia 03/06/2012  . Hypertensive heart disease 03/06/2012  . Nonischemic cardiomyopathy ([place]) 03/06/2012  . Tobacco abuse 03/06/2012  . Type 2 diabetes mellitus ([place]) 03/06/2012  . Hypertension 03/06/2012  . CAD (coronary artery disease), native coronary artery 03/06/2012  . Hypokalemia 03/06/2012  . Acute bronchitis 02/01/2009  . Sleep apnea 02/01/2009  . Chest pain 02/01/2009    Current Outpatient Medications:  .  acetaminophen (TYLENOL) 500 MG tablet, Take 1 tablet (500 mg total) by mouth every 6 (six) hours as needed., Disp: 30 tablet, Rfl: 0 .  albuterol (PROVENTIL HFA;VENTOLIN HFA) 108 (90 Base) MCG/ACT inhaler, Inhale 2 puffs into the lungs every 6 (six) hours as needed for wheezing or shortness of breath., Disp: 18 g, Rfl: 0 .  atorvastatin (LIPITOR) 40 MG tablet, TAKE 1 TABLET(40 MG) BY MOUTH DAILY, Disp: 30 tablet, Rfl: 11 .  baclofen (LIORESAL) 10 MG tablet, TK 1 T PO BID PRN, Disp: , Rfl:  .  carvedilol  (COREG) 25 MG tablet, TAKE 1 TABLET(25 MG) BY MOUTH TWICE DAILY WITH A MEAL, Disp: 180 tablet, Rfl: 1 .  ELIQUIS 5 MG TABS tablet, TAKE 1 TABLET(5 MG) BY MOUTH TWICE DAILY, Disp: 60 tablet, Rfl: 1 .  furosemide (LASIX) 40 MG tablet, Take 1 tablet (40 mg total) by mouth 2 (two) times daily., Disp: 180 tablet, Rfl: 3 .  gabapentin (NEURONTIN) 100 MG capsule, TK 1 C PO TID, Disp: , Rfl:  .  HUMALOG KWIKPEN 100 UNIT/ML KwikPen, INJECT 10 UNITS INTO THE SKIN WITH EACH MEAL, Disp: , Rfl:  .  Insulin Glargine (LANTUS SOLOSTAR) 100 UNIT/ML Solostar Pen, Inject 50 Units into the skin daily at 10 pm., Disp: 15 mL, Rfl: 0 .  isosorbide-hydrALAZINE (BIDIL) 20-37.5 MG tablet, Take 2 tablets by mouth 3 (three) times daily., Disp: 180 tablet, Rfl: 6 .  metFORMIN (GLUCOPHAGE-XR) 500 MG 24 hr tablet, TK 2 TS PO BID, Disp: , Rfl:  .  potassium chloride SA (KLOR-CON) 20 MEQ tablet, TAKE 3 TABLETS(60 MEQ) BY MOUTH DAILY, Disp: 270 tablet, Rfl: 3 .  sacubitril-valsartan (ENTRESTO) 97-103 MG, Take 1 tablet by mouth 2 (two) times daily., Disp: 180 tablet, Rfl: 3 .  spironolactone (ALDACTONE) 25 MG tablet, TAKE 1 TABLET(25 MG) BY MOUTH DAILY, Disp: 90 tablet, Rfl: 3 .  Tafamidis (VYNDAMAX) 61 MG CAPS, Take 61 mg by mouth daily., Disp: 90 capsule, Rfl: 3 .  benzonatate (TESSALON) 100 MG capsule, Take 1 capsule (100 mg total) by mouth every 8 (eight) hours. (Patient not taking: Reported on 10/21/2019), Disp: 21 capsule, Rfl: 0 .  glucose blood (FREESTYLE TEST STRIPS) test strip, Use as instructed, Disp: 100 each, Rfl: 0 .  glucose monitoring kit (FREESTYLE) monitoring kit, 1 each by Does not apply route 4 (four) times daily - after meals and at bedtime. 1 month Diabetic Testing Supplies for QAC-QHS accuchecks., Disp: 1 each, Rfl: 1 .  insulin aspart (NOVOLOG FLEXPEN) 100 UNIT/ML FlexPen, 0-20 Units, Subcutaneous, 3 times daily with meals CBG < 70: implement hypoglycemia protocol CBG 70 - 120: 0 units CBG 121 - 150: 3 units CBG  151 - 200: 4 units CBG 201 - 250: 7 units CBG 251 - 300: 11 units CBG 301 - 350: 15 units CBG 351 - 400: 20 units CBG > 400: call MD (Patient not taking: Reported on 10/21/2019), Disp: 15 mL, Rfl: 0 .  Insulin Pen Needle 32G X 8 MM MISC, Use as directed, Disp: 100 each, Rfl: 0 .  Lancets (FREESTYLE) lancets, Use as instructed, Disp: 100 each, Rfl: 0 .  naproxen sodium (ALEVE) 220 MG tablet, Take 220 mg by mouth daily as needed (for pain or headache).  (Patient not taking: Reported on 07/13/2019), Disp: , Rfl:  .  Oxycodone HCl 10 MG TABS, Take 10 mg by mouth 4 (four) times daily as needed (for pain). (Patient not taking: Reported on 08/05/2019), Disp: , Rfl:  .  polyethylene glycol (MIRALAX / GLYCOLAX) packet, Take 17 g by mouth daily as needed for mild constipation. (Patient not taking: Reported on 10/21/2019), Disp: 14 each, Rfl: 0 Allergies  Allergen Reactions  . Morphine And Related Nausea Only    Severe nausea  . Penicillins Other (See Comments)    Pt states she has had a pain in her leg since a penicillin injection 2 months ago (reported 08/31/17).  Has tolerated amoxicillin oral.  Has patient had a PCN reaction causing immediate rash, facial/tongue/throat swelling, SOB or lightheadedness with hypotension: No Has patient had a PCN reaction causing severe rash involving mucus membranes or skin necrosis: No Has patient had a PCN reaction that required hospitalization: No Has patient had a PCN reaction occurring within the last 10 years: Yes If a      Social History   Socioeconomic History  . Marital status: Single    Spouse name: Not on file  . Number of children: 4  . Years of education: 66  . Highest education level: Not on file  Occupational History  . Not on file  Tobacco Use  . Smoking status: Former Smoker    Packs/day: 1.00    Years: 40.00    Pack years: 40.00    Types: Cigarettes    Start date: 06/23/1972    Quit date: 03/26/2012    Years since quitting: 7.6  . Smokeless  tobacco: Never Used  Vaping Use  . Vaping Use: Never assessed  Substance and Sexual Activity  . Alcohol use: No  . Drug use: No  . Sexual activity: Yes  Other Topics Concern  . Not on file  Social History Narrative  . Not on file   Social Determinants of Health   Financial Resource Strain:   . Difficulty of Paying Living Expenses: Not on file  Food Insecurity:   . Worried About Charity fundraiser in the Last Year: Not on file  . Ran Out of Food in the Last Year: Not on file  Transportation Needs:   . Lack of Transportation (Medical): Not on file  . Lack of Transportation (Non-Medical): Not on file  Physical Activity:   .  Days of Exercise per Week: Not on file  . Minutes of Exercise per Session: Not on file  Stress:   . Feeling of Stress : Not on file  Social Connections:   . Frequency of Communication with Friends and Family: Not on file  . Frequency of Social Gatherings with Friends and Family: Not on file  . Attends Religious Services: Not on file  . Active Member of Clubs or Organizations: Not on file  . Attends Archivist Meetings: Not on file  . Marital Status: Not on file  Intimate Partner Violence:   . Fear of Current or Ex-Partner: Not on file  . Emotionally Abused: Not on file  . Physically Abused: Not on file  . Sexually Abused: Not on file    Physical Exam Cardiovascular:     Rate and Rhythm: Normal rate and regular rhythm.     Pulses: Normal pulses.  Pulmonary:     Effort: Pulmonary effort is normal.     Breath sounds: Normal breath sounds.  Musculoskeletal:        General: Normal range of motion.     Right lower leg: No edema.     Left lower leg: No edema.  Skin:    General: Skin is warm and dry.     Capillary Refill: Capillary refill takes less than 2 seconds.  Neurological:     Mental Status: She is alert and oriented to person, place, and time.  Psychiatric:        Mood and Affect: Mood normal.         Future Appointments   Date Time Provider [place]  12/05/2019  8:05 AM [facility] DEVICE REMOTES [facility]  12/13/2019  9:30 AM [facility] PA/NP [facility] None  01/09/2020 10:00 AM Bernarda Caffey, MD [facility] None    BP 129/69 (BP Location: Left Arm, Patient Position: Sitting, Cuff Size: Large)   Pulse 89   Resp 16   Wt 234 lb (106.1 kg)   SpO2 99%   BMI 36.65 kg/m   Weight yesterday- did not weigh Last visit weight- 238 lb  [name] was seen at home today and reported feeling well. She denied chest pain, SOB, headache, dizziness, orthopnea, fever or cough over the past week. She stated she has been compliant with her medications over the past week and her weight has been stable. Her medications were verified and her pillboxes were refilled. I will follow up in two weeks.   [name], EMT 11/29/19  ACTION: Home visit completed Next visit planned for 2 weeks

## 2019-12-05 MED FILL — VYNDAMAX 61 MG CAPS: 61 | 30 days supply | Qty: 30 | Fill #1

## 2019-12-07 ENCOUNTER — Telehealth (HOSPITAL_COMMUNITY): Payer: Self-pay | Admitting: *Deleted

## 2019-12-07 NOTE — Telephone Encounter (Signed)
Received fax from [facility], pt needs clearance for colonoscopy.  Per Dr Aundra Dubin: "ok for colonoscopy"  Form faxed back to them at [phone] and to [place] at [phone]

## 2019-12-12 NOTE — Progress Notes (Signed)
Patient ID: [name], female   DOB: [birth year], 69 y.o.   MRN: [id number]   Advanced Heart Failure Clinic Note   PCP: Dr. Nancy Fetter [facility]) Cardiology: Dr Dora Sims is a 69 y.o. female with history of nonischemic cardiomyopathy.  She was admitted with CHF exacerbation in 02/2012.  EF 20-25% on echo, LHC with nonobstructive CAD.  She was started on cardiac meds and discharged.  In 07/2012, she was admitted again with CHF exacerbation.  She had run out of Lasix.  She was taking her other heart medications as ordered, however.  She was diuresed and discharged. She had a chronic LBBB, and Medtronic CRT-D device was placed in 10/14.  She was admitted in 6/16 with hypertensive emergency and CHF exacerbation.   Also of note, she had PFTs in 9/16 showing restrictive spirometry with low lung volume and DLCO.  She was supposed to get a high resolution CT to evaluate for interstitial lung disease but never had the study.     Admitted March 2018 with urosepsis--> E Coli Bacteremia. Completed antibiotic course. EF was down from previous to 30-35%. Also had atrial fibrillation so she was loaded on amiodarone. Placed on eliquis. Discharge weight was 208 pounds.  She is now off amiodarone.   Admitted to [facility] 1/16 -> 02/13/16 with CP and dizziness. Found to be in Afib. Converted spontaneously overnight with BB and diuresis.   Echo 11/19 showed EF 35-40%.  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   Today she returns for HF follow up. Overall feeling fine.  Occasionally short of breath. Says she stays active when she is out shopping. Denies PND/Orthopnea. Appetite ok. No fever or chills. She has not been weighing at home. She does not have glucometer. Taking all medications.  Optivol:Fluid index below threshold. Activity ~4 hours . No VT/ AFib   Labs (2/14): SPEP negative, UPEP negative, HIV negative Labs (04/14/2017): K 3.8 Creatinine 0.94   Labs (9/19): K 3.3, creatinine 0.86 Labs (1/20): K 4, creatinine 0.87 Labs (08/03/2019: K 3.5 Creatinine 1   PMH: 1. HTN 2. Type II diabetes with neuropathy 3. Nonischemic cardiomyopathy: ? Due to HTN versus LBBB CMP.  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  Echo (2/14) with EF 20-25%.  Echo (7/14) with EF 25%, diffuse hypokinesis.  HIV, SPEP, UPEP negative.  Has LBBB. CRT-D 10/2012 (Medtronic).  Echo (4/15) with EF 45-50%, mild diffuse hypokinesis, PA systolic pressure 38 mmHg.  Echo (6/16) with EF 40-45%, mild LVH, septal and inferior hypokinesis.   - Echo (3/18): EF 30-35%. Grade 1 DD - Echo (6/18): EF 45-50%, moderate LVH, normal RV size with mildly decreased systolic function.  - Echo (11/19): EF 35-40%, moderate LVH, moderate diastolic dysfunction, normal RV size with mildly decreased systolic function, PASP 43 mmHg.  4. Chronic LBBB 5. Right TKR 6. Bilateral THR.  7. Hyperlipidemia 8. ?COPD: Has oxygen for use with exertion.  - PFTs (9/16) with FEV1 77%, FVC 76%, ratio 101%, TLC 63%, DLCO 41% => moderate restrictive deficit  9. Atrial fibrillation: Paroxysmal.  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   SH: Prior smoker, quit 2/14.  Never drank ETOH.  No drugs. Lives with son.   FH: Mother with "heart trouble."   Review of systems complete and found to be negative unless listed in HPI.    Current Outpatient Medications  Medication Sig Dispense Refill  . acetaminophen (TYLENOL) 500 MG tablet Take 1 tablet (500 mg total) by mouth every 6 (six) hours as needed. 30 tablet 0  . albuterol (PROVENTIL HFA;VENTOLIN HFA) 108 (90 Base) MCG/ACT inhaler Inhale 2 puffs into the lungs every 6 (six) hours as needed for wheezing or shortness of breath. 18 g 0  . atorvastatin (LIPITOR) 40 MG tablet TAKE 1 TABLET(40 MG) BY MOUTH DAILY 30 tablet 11  . baclofen (LIORESAL) 10 MG tablet TK 1 T PO BID PRN    .  benzonatate (TESSALON) 100 MG capsule Take 1 capsule (100 mg total) by mouth every 8 (eight) hours. (Patient not taking: Reported on 10/21/2019) 21 capsule 0  . carvedilol (COREG) 25 MG tablet TAKE 1 TABLET(25 MG) BY MOUTH TWICE DAILY WITH A MEAL 180 tablet 1  . ELIQUIS 5 MG TABS tablet TAKE 1 TABLET(5 MG) BY MOUTH TWICE DAILY 60 tablet 1  . furosemide (LASIX) 40 MG tablet Take 1 tablet (40 mg total) by mouth 2 (two) times daily. 180 tablet 3  . gabapentin (NEURONTIN) 100 MG capsule TK 1 C PO TID    . glucose blood (FREESTYLE TEST STRIPS) test strip Use as instructed 100 each 0  . glucose monitoring kit (FREESTYLE) monitoring kit 1 each by Does not apply route 4 (four) times daily - after meals and at bedtime. 1 month Diabetic Testing Supplies for QAC-QHS accuchecks. 1 each 1  . HUMALOG KWIKPEN 100 UNIT/ML KwikPen INJECT 10 UNITS INTO THE SKIN WITH EACH MEAL    . insulin aspart (NOVOLOG FLEXPEN) 100 UNIT/ML FlexPen 0-20 Units, Subcutaneous, 3 times daily with meals CBG < 70: implement hypoglycemia protocol CBG 70 - 120: 0 units CBG 121 - 150: 3 units CBG 151 - 200: 4 units CBG 201 - 250: 7 units CBG 251 - 300: 11 units CBG 301 - 350: 15 units CBG 351 - 400: 20 units CBG > 400: call MD (Patient not taking: Reported on 10/21/2019) 15 mL 0  . Insulin Glargine (LANTUS SOLOSTAR) 100 UNIT/ML Solostar Pen Inject 50 Units into the skin daily at 10 pm. 15 mL 0  . Insulin Pen Needle 32G X 8 MM MISC Use as directed 100 each 0  . isosorbide-hydrALAZINE (BIDIL) 20-37.5 MG tablet Take 2 tablets by mouth 3 (three) times daily. 180 tablet 6  . Lancets (FREESTYLE) lancets Use as instructed 100 each 0  . metFORMIN (GLUCOPHAGE-XR) 500 MG 24 hr tablet TK 2 TS PO BID    . naproxen sodium (ALEVE) 220 MG tablet Take 220 mg by mouth daily as needed (for pain or headache).  (Patient not taking: Reported on 07/13/2019)    . Oxycodone HCl 10 MG TABS Take 10 mg by mouth 4 (four) times daily as needed (for pain). (Patient  not taking: Reported on 08/05/2019)    . polyethylene glycol (MIRALAX / GLYCOLAX) packet Take 17 g by mouth daily as needed for mild constipation. (Patient not taking: Reported on 10/21/2019) 14 each 0  . potassium chloride SA (KLOR-CON) 20 MEQ tablet TAKE 3 TABLETS(60 MEQ) BY MOUTH DAILY 270 tablet 3  . sacubitril-valsartan (ENTRESTO) 97-103 MG Take 1 tablet by mouth 2 (two) times daily. 180 tablet 3  . spironolactone (ALDACTONE) 25 MG tablet TAKE 1 TABLET(25 MG) BY MOUTH DAILY 90 tablet 3  . Tafamidis (VYNDAMAX) 61 MG CAPS Take 61 mg by mouth daily. 90 capsule 3   No current facility-administered medications for this visit.   There were no vitals  filed for this visit.  Wt Readings from Last 3 Encounters:  11/29/19 106.1 kg (234 lb)  11/15/19 108 kg (238 lb)  11/09/19 106.8 kg (235 lb 8 oz)    Physical exam General:  Well appearing. No resp difficulty. Walked in the clinic.  HEENT: normal Neck: supple. no JVD. Carotids 2+ bilat; no bruits. No lymphadenopathy or thryomegaly appreciated. Cor: PMI nondisplaced. Regular rate & rhythm. No rubs, gallops or murmurs. Lungs: clear Abdomen: soft, nontender, nondistended. No hepatosplenomegaly. No bruits or masses. Good bowel sounds. Extremities: no cyanosis, clubbing, rash, edema Neuro: alert & orientedx3, cranial nerves grossly intact. moves all 4 extremities w/o difficulty. Affect pleasant  Assessment/Plan: 1. Chronic systolic CHF: Nonischemic cardiomyopathy, thought to be related to HTN. S/P CRT-D (Medtronic). Echo 11/19 showed EF 35-40%, moderate LVH.  NYHA II. Volume status stable. Continue lasix 40 mg twice a day.  Optivol -fluid index well below threshold. Activity ~4 hours per day.  - Continue Coreg 25 mg BID.  - Continue Entresto 97/103 mg BID - Continue bidil 2 tabs TID - Continue spiro 25 mg daily.  - I considered SGT2i . Last Hgb A1C in our system was > 11. If better controlled can consider adding.  I have requested lab work from  her PCP.  - Continue paramedicine.   2. Hyperlipidemia: Continue statin.   3. HTN: Stable.  4. PAF:  - no recent A fib.  - Continue Eliquis for anticoagulation.  5. 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 Genetic testing was negative (wild type).  - Continue tafamidis. No issues obtaining med.   Greater than 50% of the (total minutes 25) visit spent in counseling/coordination of care regarding the above. Discussed Optival results. Follow up in 3 months with an ECHO and Dr Aundra Dubin.    Continue HF Paramedicine. Would like to get a little better controlled then hopefully we can discharge.    Darrick Grinder, NP  12/12/2019

## 2019-12-13 ENCOUNTER — Other Ambulatory Visit: Payer: Self-pay

## 2019-12-13 ENCOUNTER — Ambulatory Visit (HOSPITAL_COMMUNITY)
Admission: RE | Admit: 2019-12-13 | Discharge: 2019-12-13 | Disposition: A | Discharge status: Home / Self Care | Payer: Medicare Other | Source: Ambulatory Visit | Attending: Adult Health | Admitting: Adult Health

## 2019-12-13 ENCOUNTER — Other Ambulatory Visit (HOSPITAL_COMMUNITY): Payer: Self-pay

## 2019-12-13 VITALS — BP 135/87 | HR 77 | Wt 237.4 lb

## 2019-12-13 DIAGNOSIS — I428 Other cardiomyopathies: Secondary | ICD-10-CM | POA: Insufficient documentation

## 2019-12-13 DIAGNOSIS — Z7901 Long term (current) use of anticoagulants: Secondary | ICD-10-CM | POA: Insufficient documentation

## 2019-12-13 DIAGNOSIS — Z794 Long term (current) use of insulin: Secondary | ICD-10-CM | POA: Diagnosis not present

## 2019-12-13 DIAGNOSIS — I480 Paroxysmal atrial fibrillation: Secondary | ICD-10-CM | POA: Diagnosis not present

## 2019-12-13 DIAGNOSIS — I43 Cardiomyopathy in diseases classified elsewhere: Secondary | ICD-10-CM | POA: Diagnosis not present

## 2019-12-13 DIAGNOSIS — Z87891 Personal history of nicotine dependence: Secondary | ICD-10-CM | POA: Diagnosis not present

## 2019-12-13 DIAGNOSIS — Z79899 Other long term (current) drug therapy: Secondary | ICD-10-CM | POA: Insufficient documentation

## 2019-12-13 DIAGNOSIS — I2510 Atherosclerotic heart disease of native coronary artery without angina pectoris: Secondary | ICD-10-CM | POA: Insufficient documentation

## 2019-12-13 DIAGNOSIS — E785 Hyperlipidemia, unspecified: Secondary | ICD-10-CM | POA: Diagnosis not present

## 2019-12-13 DIAGNOSIS — I5022 Chronic systolic (congestive) heart failure: Secondary | ICD-10-CM | POA: Insufficient documentation

## 2019-12-13 DIAGNOSIS — E854 Organ-limited amyloidosis: Secondary | ICD-10-CM | POA: Diagnosis not present

## 2019-12-13 DIAGNOSIS — I110 Hypertensive heart disease with heart failure: Secondary | ICD-10-CM | POA: Insufficient documentation

## 2019-12-13 NOTE — Progress Notes (Signed)
Per Darrick Grinder, NP, contacted Dr. Lynnda Child office at [facility] on [address]. Spoke with Myriam Jacobson and requested that most recent lab work be faxed over. Myriam Jacobson stated she sent an urgent message to the nursing team.

## 2019-12-13 NOTE — Progress Notes (Signed)
[name] was seen at home today following her clinic visit to refill her pillboxes. She had enough medications to fill her boxes except for potassium and Eliquis. I contacted the pharmacy and had both scheduled to be refilled. I will pick them up tomorrow and finish her boxes then.   [name], EMT 12/13/19

## 2019-12-13 NOTE — Patient Instructions (Signed)
Your physician recommends that you schedule a follow-up appointment in: 3 months with echocardiogram  Your physician has requested that you have an echocardiogram. Echocardiography is a painless test that uses sound waves to create images of your heart. It provides your doctor with information about the size and shape of your heart and how well your heart's chambers and valves are working. This procedure takes approximately one hour. There are no restrictions for this procedure.   If you have any questions or concerns before your next appointment please send [place] a message through [place] or call our office at [phone].    TO LEAVE A MESSAGE FOR THE NURSE SELECT OPTION 2, PLEASE LEAVE A MESSAGE INCLUDING: . YOUR NAME . DATE OF BIRTH . CALL BACK NUMBER . REASON FOR CALL**this is important as we prioritize the call backs  YOU WILL RECEIVE A CALL BACK THE SAME DAY AS LONG AS YOU CALL BEFORE 4:00 PM

## 2019-12-20 ENCOUNTER — Other Ambulatory Visit (HOSPITAL_COMMUNITY): Payer: Self-pay

## 2019-12-20 NOTE — Progress Notes (Signed)
[name] was seen at home today and reported feeling well. She denied chets pain, SOB, headache, dizziness, orthopnea, fever or cough since our last visit. She has been compliant with her medications over the past week but was in need of refills today. The purpose of this visit was to deliver the refills and add them to her pillbox. Nothing further was necessary and I will follow up next week.   [name], EMT 12/20/19

## 2019-12-28 ENCOUNTER — Telehealth (HOSPITAL_COMMUNITY): Payer: Self-pay

## 2019-12-28 NOTE — Telephone Encounter (Signed)
I called Ms Maggio to see if she was available for an appointment sometime this week. Her phone went straight to voicemail so I left a message requesting she call me back. I will try again tomorrow or Friday.   [name], EMT 12/28/19

## 2019-12-29 ENCOUNTER — Other Ambulatory Visit (HOSPITAL_COMMUNITY): Payer: Self-pay

## 2019-12-29 MED FILL — VYNDAMAX 61 MG CAPS: 61 | 30 days supply | Qty: 30 | Fill #2

## 2019-12-29 NOTE — Progress Notes (Signed)
[name] was seen at home this morning and reported feeling well. She denied chest pain, SOB, headache, dizziness, orthopnea, fever or cough over the past week. She stated she has been compliant with her medications over the past week but had not taken this today. As a result I did not check her vital signs today but will return tomorrow after she has taken all her medications and check them then. Her medications were verified and  Her pillboxes were refilled. I will follow up in two tomorrow.   [name], EMT 12/29/19

## 2020-01-02 ENCOUNTER — Encounter (HOSPITAL_COMMUNITY): Payer: Self-pay

## 2020-01-02 ENCOUNTER — Other Ambulatory Visit: Payer: Self-pay

## 2020-01-02 ENCOUNTER — Emergency Department (HOSPITAL_COMMUNITY)
Admission: EM | Admit: 2020-01-02 | Discharge: 2020-01-02 | Disposition: A | Discharge status: Home / Self Care | Payer: Medicare Other | Attending: Emergency Medicine | Admitting: Emergency Medicine

## 2020-01-02 DIAGNOSIS — Z7984 Long term (current) use of oral hypoglycemic drugs: Secondary | ICD-10-CM | POA: Diagnosis not present

## 2020-01-02 DIAGNOSIS — Z79899 Other long term (current) drug therapy: Secondary | ICD-10-CM | POA: Diagnosis not present

## 2020-01-02 DIAGNOSIS — Z95810 Presence of automatic (implantable) cardiac defibrillator: Secondary | ICD-10-CM | POA: Insufficient documentation

## 2020-01-02 DIAGNOSIS — I110 Hypertensive heart disease with heart failure: Secondary | ICD-10-CM | POA: Diagnosis not present

## 2020-01-02 DIAGNOSIS — H1032 Unspecified acute conjunctivitis, left eye: Secondary | ICD-10-CM | POA: Diagnosis not present

## 2020-01-02 DIAGNOSIS — Z7901 Long term (current) use of anticoagulants: Secondary | ICD-10-CM | POA: Diagnosis not present

## 2020-01-02 DIAGNOSIS — Z96651 Presence of right artificial knee joint: Secondary | ICD-10-CM | POA: Diagnosis not present

## 2020-01-02 DIAGNOSIS — I2510 Atherosclerotic heart disease of native coronary artery without angina pectoris: Secondary | ICD-10-CM | POA: Insufficient documentation

## 2020-01-02 DIAGNOSIS — I5042 Chronic combined systolic (congestive) and diastolic (congestive) heart failure: Secondary | ICD-10-CM | POA: Insufficient documentation

## 2020-01-02 DIAGNOSIS — Z794 Long term (current) use of insulin: Secondary | ICD-10-CM | POA: Insufficient documentation

## 2020-01-02 DIAGNOSIS — Z87891 Personal history of nicotine dependence: Secondary | ICD-10-CM | POA: Diagnosis not present

## 2020-01-02 DIAGNOSIS — Z96643 Presence of artificial hip joint, bilateral: Secondary | ICD-10-CM | POA: Insufficient documentation

## 2020-01-02 DIAGNOSIS — H5789 Other specified disorders of eye and adnexa: Secondary | ICD-10-CM | POA: Diagnosis present

## 2020-01-02 DIAGNOSIS — E119 Type 2 diabetes mellitus without complications: Secondary | ICD-10-CM | POA: Diagnosis not present

## 2020-01-02 MED ORDER — FLUORESCEIN SODIUM 1 MG OP STRP
1.0000 | ORAL_STRIP | Freq: Once | OPHTHALMIC | Status: AC
  Administered 2020-01-02: 1 via OPHTHALMIC
  Filled 2020-01-02: qty 1

## 2020-01-02 MED ORDER — ERYTHROMYCIN 5 MG/GM OP OINT: TOPICAL_OINTMENT | OPHTHALMIC | 0 refills | Status: DC

## 2020-01-02 MED ORDER — TETRACAINE HCL 0.5 % OP SOLN
2.0000 [drp] | Freq: Once | OPHTHALMIC | Status: AC
  Administered 2020-01-02: 2 [drp] via OPHTHALMIC
  Filled 2020-01-02: qty 4

## 2020-01-02 NOTE — ED Triage Notes (Signed)
Patient c/o left eye drainage since yesterday AM. Patient denies any pain or loss of vision.

## 2020-01-02 NOTE — ED Provider Notes (Signed)
[place] DEPT Provider Note   CSN: [id number] Arrival date & time: 01/02/20  1135     History Chief Complaint  Patient presents with  . Eye Drainage    [name] is a 69 y.o. female with pertinent past medical history of hypertension, diabetes, anemia that presents emerge department today for left eye drainage.  Patient states that this started yesterday while she was cooking, unsure if something got into her eye but does have sensation that there is something in her eye.  States that she doesn't think that something got into her eye, did not feel as if something did.  Denies any pain in her eye, states that there might be some slight pain around her eye.  States that her eye has been injected and she is also noticed some crusty drainage when she woke up this morning.  Denies any sick contacts, or any URI symptoms.  No fevers, congestion, myalgias, cough, sore throat, nasal drainage.  Denies any vision loss or vision changes.  No photophobia, no headache.  States that she was in normal health before this.  Does not wear any contacts.  HPI     Past Medical History:  Diagnosis Date  . Anemia    a. Noted on 07/2012 labs, instructed to f/u PCP.  Marland Kitchen Arthritis    "joints" (11/18/2012)  . CAD (coronary artery disease), native coronary artery    a. Nonobstructive by cath 02/2012 (done because of low EF).  . Chronic bronchitis ([place])    "~ every other year" (11/18/2012)  . Chronic combined systolic and diastolic CHF (congestive heart failure) ([place])    a. 03/05/12 echo:  LVEF 20-25%, moderate LVH , inferior and basal to mid septal akinesis, anterior moderate to severe hypokinesis and grade 2 diastolic dysfunction. b. EF 07/2012: EF still 25% (unclear medication compliance).  . Chronic lower back pain   . Headache(784.0)    "often; maybe not daily" (11/18/2012)  . High cholesterol   . History of noncompliance with medical treatment   . Hypertension   . LBBB  (left bundle branch block)   . Orthopnea   . Tobacco abuse   . Type II diabetes mellitus [place])     Patient Active Problem List   Diagnosis Date Noted  . Right foot infection 10/22/2017  . Cellulitis in diabetic foot ([place]) 10/22/2017  . Hyperglycemia 10/22/2017  . Lumbar radiculopathy 09/18/2017  . Degenerative spondylolisthesis 09/18/2017  . Atrial fibrillation ([place]) 02/11/2017  . Paroxysmal A-fib ([place])   . Biventricular automatic implantable cardioverter defibrillator in situ   . Sepsis ([place]) 04/20/2016  . UTI (urinary tract infection) 04/20/2016  . Dyspnea 07/19/2015  . Interstitial lung disease ([place]) 11/20/2014  . SIRS (systemic inflammatory response syndrome) ([place]) 02/03/2014  . IDDM (insulin dependent diabetes mellitus) 02/03/2014  . Tachycardia 06/14/2013  . Chronic systolic CHF (congestive heart failure) ([place]) 09/29/2012  . Hypoxia 03/06/2012  . Hypertensive heart disease 03/06/2012  . Nonischemic cardiomyopathy ([place]) 03/06/2012  . Tobacco abuse 03/06/2012  . Type 2 diabetes mellitus ([place]) 03/06/2012  . Hypertension 03/06/2012  . CAD (coronary artery disease), native coronary artery 03/06/2012  . Hypokalemia 03/06/2012  . Acute bronchitis 02/01/2009  . Sleep apnea 02/01/2009  . Chest pain 02/01/2009    Past Surgical History:  Procedure Laterality Date  . BI-VENTRICULAR IMPLANTABLE CARDIOVERTER DEFIBRILLATOR N/A 11/18/2012   Procedure: BI-VENTRICULAR IMPLANTABLE CARDIOVERTER DEFIBRILLATOR  (CRT-D);  Surgeon: Evans Lance, MD;  Location: [facility] C[facility] LAB;  Service: Cardiovascular;  Laterality: N/A;  . BI-VENTRICULAR IMPLANTABLE CARDIOVERTER DEFIBRILLATOR  (CRT-D)  11/18/2012  . CARDIAC CATHETERIZATION  03/04/12   nonobstructive CAD, elevated LVEDP and tortuous vessels suggestive of long-standing hypertension  . COLONOSCOPY WITH PROPOFOL N/A 12/12/2016   Procedure: COLONOSCOPY WITH PROPOFOL;  Surgeon: Carol Ada, MD;  Location: [facility] ENDOSCOPY;  Service: Endoscopy;   Laterality: N/A;  . JOINT REPLACEMENT     Bilateral hip and right knee  . LEFT HEART CATH N/A 03/05/2012   Procedure: LEFT HEART CATH;  Surgeon: Larey Dresser, MD;  Location[facility] C[facility] LAB;  Service: Cardiovascular;  Laterality: N/A;     OB History   No obstetric history on file.     Family History  Problem Relation Age of Onset  . Heart failure Mother   . Heart disease Neg Hx     Social History   Tobacco Use  . Smoking status: Former Smoker    Packs/day: 1.00    Years: 40.00    Pack years: 40.00    Types: Cigarettes    Start date: 06/23/1972    Quit date: 03/26/2012    Years since quitting: 7.7  . Smokeless tobacco: Never Used  Vaping Use  . Vaping Use: Never used  Substance Use Topics  . Alcohol use: No  . Drug use: No    Home Medications Prior to Admission medications   Medication Sig Start Date End Date Taking? Authorizing Provider  acetaminophen (TYLENOL) 500 MG tablet Take 1 tablet (500 mg total) by mouth every 6 (six) hours as needed. 09/15/15   Leo Grosser, MD  albuterol (PROVENTIL HFA;VENTOLIN HFA) 108 (90 Base) MCG/ACT inhaler Inhale 2 puffs into the lungs every 6 (six) hours as needed for wheezing or shortness of breath. 04/25/16   Ghimire, Henreitta Leber, MD  atorvastatin (LIPITOR) 40 MG tablet TAKE 1 TABLET(40 MG) BY MOUTH DAILY 04/26/19   Larey Dresser, MD  baclofen (LIORESAL) 10 MG tablet TK 1 T PO BID PRN 02/08/18   [provider]  carvedilol (COREG) 25 MG tablet TAKE 1 TABLET(25 MG) BY MOUTH TWICE DAILY WITH A MEAL 10/28/19   Larey Dresser, MD  ELIQUIS 5 MG TABS tablet TAKE 1 TABLET(5 MG) BY MOUTH TWICE DAILY 08/16/19   Larey Dresser, MD  erythromycin ophthalmic ointment Place a 1/2 inch ribbon of ointment into the lower eyelid up to 6 times a day for 3-5 days 01/02/20   Alfredia Client, PA-C  furosemide (LASIX) 40 MG tablet Take 1 tablet (40 mg total) by mouth 2 (two) times daily. 10/28/19   Larey Dresser, MD  gabapentin (NEURONTIN) 100 MG  capsule TK 1 C PO TID 03/10/18   [provider]  glucose blood (FREESTYLE TEST STRIPS) test strip Use as instructed 04/25/16   Ghimire, Henreitta Leber, MD  glucose monitoring kit (FREESTYLE) monitoring kit 1 each by Does not apply route 4 (four) times daily - after meals and at bedtime. 1 month Diabetic Testing Supplies for QAC-QHS accuchecks. 04/25/16   Ghimire, Henreitta Leber, MD  HUMALOG KWIKPEN 100 UNIT/ML KwikPen INJECT 10 UNITS INTO THE SKIN WITH [facility] MEAL 02/12/18   [provider]  Insulin Glargine (LANTUS SOLOSTAR) 100 UNIT/ML Solostar Pen Inject 50 Units into the skin daily at 10 pm. 10/20/17   Recardo Evangelist, PA-C  Insulin Pen Needle 32G X 8 MM MISC Use as directed 06/18/16   Arbutus Leas, NP  isosorbide-hydrALAZINE (BIDIL) 20-37.5 MG tablet Take 2 tablets by mouth 3 (three) times daily.  07/20/19   Larey Dresser, MD  Lancets (FREESTYLE) lancets Use as instructed 06/18/16   Arbutus Leas, NP  metFORMIN (GLUCOPHAGE-XR) 500 MG 24 hr tablet TK 2 TS PO BID 11/03/18   [provider]  potassium chloride SA (KLOR-CON) 20 MEQ tablet TAKE 3 TABLETS(60 MEQ) BY MOUTH DAILY 11/09/19   Larey Dresser, MD  sacubitril-valsartan (ENTRESTO) 97-103 MG Take 1 tablet by mouth 2 (two) times daily. 08/16/19   Larey Dresser, MD  spironolactone (ALDACTONE) 25 MG tablet TAKE 1 TABLET(25 MG) BY MOUTH DAILY 10/28/19   Larey Dresser, MD  Tafamidis [facility]) 61 MG CAPS Take 61 mg by mouth daily. 09/30/19   Larey Dresser, MD    Allergies    Morphine and related and Penicillins  Review of Systems   Review of Systems  Constitutional: Negative for diaphoresis, fatigue and fever.  Eyes: Positive for discharge and redness. Negative for visual disturbance.  Respiratory: Negative for shortness of breath.   Cardiovascular: Negative for chest pain.  Gastrointestinal: Negative for nausea and vomiting.  Musculoskeletal: Negative for back pain and myalgias.  Skin: Negative for color change,  pallor, rash and wound.  Neurological: Negative for syncope, weakness, light-headedness, numbness and headaches.  Psychiatric/Behavioral: Negative for behavioral problems and confusion.    Physical Exam Updated Vital Signs BP 127/66 (BP Location: Left Arm)   Pulse 97   Temp 98.1 F (36.7 C) (Oral)   Resp 18   Ht '5\' 7"'  (1.702 m)   Wt 107.5 kg   SpO2 95%   BMI 37.12 kg/m   Physical Exam Constitutional:      General: She is not in acute distress.    Appearance: Normal appearance. She is not ill-appearing, toxic-appearing or diaphoretic.  Eyes:     General: Lids are normal. Lids are everted, no foreign bodies appreciated. Vision grossly intact. Gaze aligned appropriately. No allergic shiner, visual field deficit or scleral icterus.       Right eye: No foreign body, discharge or hordeolum.        Left eye: Discharge present.No foreign body.     Extraocular Movements: Extraocular movements intact.     Right eye: Normal extraocular motion and no nystagmus.     Left eye: Normal extraocular motion and no nystagmus.     Conjunctiva/sclera:     Right eye: No chemosis or exudate.    Left eye: Left conjunctiva is injected.     Pupils: Pupils are equal, round, and reactive to light.     Right eye: No corneal abrasion or fluorescein uptake.     Left eye: No corneal abrasion or fluorescein uptake.     Funduscopic exam:    Right eye: No exudate.        Left eye: No exudate.     Comments: Left eye slightly injected with some crusted discharge at lacrimal caruncle.  Normal EOMs, nonpainful.  Pupils equal round and reactive.  No facial tenderness or swelling around the eye.   Cardiovascular:     Rate and Rhythm: Normal rate and regular rhythm.     Pulses: Normal pulses.  Pulmonary:     Effort: Pulmonary effort is normal.     Breath sounds: Normal breath sounds.  Musculoskeletal:        General: Normal range of motion.  Skin:    General: Skin is warm and dry.     Capillary Refill:  Capillary refill takes less than 2 seconds.  Neurological:  General: No focal deficit present.     Mental Status: She is alert and oriented to person, place, and time.  Psychiatric:        Mood and Affect: Mood normal.        Behavior: Behavior normal.        Thought Content: Thought content normal.     ED Results / Procedures / Treatments   Labs (all labs ordered are listed, but only abnormal results are displayed) Labs Reviewed - No data to display  EKG None  Radiology No results found.  Procedures Procedures (including critical care time)  Medications Ordered in ED Medications  fluorescein ophthalmic strip 1 strip (1 strip Left Eye Given 01/02/20 1636)  tetracaine (PONTOCAINE) 0.5 % ophthalmic solution 2 drop (2 drops Left Eye Given 01/02/20 1636)    ED Course  I have reviewed the triage vital signs and the nursing notes.  Pertinent labs & imaging results that were available during my care of the patient were reviewed by me and considered in my medical decision making (see chart for details).    MDM Rules/Calculators/A&P                         WILLISTINE FERRALL is a 68 y.o. female with pertinent past medical history of hypertension, diabetes, anemia that presents emerge department today for left eye drainage.  Patient reports foreign body sensation, differential to include corneal abrasion versus conjunctivitis.  Unlikely to be high emergency at this time, no pain and no vision loss.  Tono-Pen unfortunately not working, however do not think that this is truly necessary at this time since patient isn't having any  vision pain or vision changes.  visual acuity intact, no fluorescein uptake on exam.  Do not think slit-lamp exam is necessary at this time since there is no uptake on exam and pt will f/u with optho tomorrow.  ophthalmoscope exam with no visual abnormality, will have patient discharged with antibiotics and have patient follow-up with ophthalmology.  Patient states  that she does have an ophthalmologist and will call them when she leaves here for follow-up.  Patient to be discharged.  Doubt need for further emergent work up at this time. I explained the diagnosis and have given explicit precautions to return to the ER including for any other new or worsening symptoms. The patient understands and accepts the medical plan as it's been dictated and I have answered their questions. Discharge instructions concerning home care and prescriptions have been given. The patient is STABLE and is discharged to home in good condition.   Final Clinical Impression(s) / ED Diagnoses Final diagnoses:  Acute conjunctivitis of left eye, unspecified acute conjunctivitis type    Rx / DC Orders ED Discharge Orders         Ordered    erythromycin ophthalmic ointment        01/02/20 1711           Alfredia Client, PA-C 01/02/20 1721    Quintella Reichert, MD 01/02/20 1836

## 2020-01-02 NOTE — Discharge Instructions (Signed)
You're seen today for conjunctivitis, want you to use the antibiotics as prescribed.  I also want you to follow-up with your ophthalmologist for questionable corneal abrasion as we discussed.  Call your ophthalmologist once leaving here, if you cannot get a hold of them you can always call the ophthalmologist Dr. Nancy Fetter, their information is provided.  If you have any new or worsening concerning symptom please come back to the emergency department.  Please use the attached instructions.  Please speak to your pharmacist in regards to any new medications or side effects to these medications that were prescribed today.

## 2020-01-05 NOTE — Progress Notes (Shared)
[facility] Note  01/09/2020     CHIEF COMPLAINT Patient presents for No chief complaint on file.   HISTORY OF PRESENT ILLNESS: [name] is a 69 y.o. female who presents to the clinic today for:   pt states she canceled her last appt due to being sick, she states her vision is stable since last visit   Referring physician: No referring provider defined for this encounter.  HISTORICAL INFORMATION:   Selected notes from the MEDICAL RECORD NUMBER Referred by Dr. Thurston Hole for concern of BRVO OD / ERM OD LEE: 02.10.20 (S. Bernstorf) [BCVA: OD: 20/25- OS: 20/25] Ocular Hx-DES OU, narrow angles PMH-DM (metformin, novolin, lantus), HTN, HLD, heart disease    CURRENT MEDICATIONS: Current Outpatient Medications (Ophthalmic Drugs)  Medication Sig  . erythromycin ophthalmic ointment Place a 1/2 inch ribbon of ointment into the lower eyelid up to 6 times a day for 3-5 days   No current facility-administered medications for this visit. (Ophthalmic Drugs)   Current Outpatient Medications (Other)  Medication Sig  . acetaminophen (TYLENOL) 500 MG tablet Take 1 tablet (500 mg total) by mouth every 6 (six) hours as needed.  Marland Kitchen albuterol (PROVENTIL HFA;VENTOLIN HFA) 108 (90 Base) MCG/ACT inhaler Inhale 2 puffs into the lungs every 6 (six) hours as needed for wheezing or shortness of breath.  Marland Kitchen atorvastatin (LIPITOR) 40 MG tablet TAKE 1 TABLET(40 MG) BY MOUTH DAILY  . baclofen (LIORESAL) 10 MG tablet TK 1 T PO BID PRN  . carvedilol (COREG) 25 MG tablet TAKE 1 TABLET(25 MG) BY MOUTH TWICE DAILY WITH A MEAL  . ELIQUIS 5 MG TABS tablet TAKE 1 TABLET(5 MG) BY MOUTH TWICE DAILY  . furosemide (LASIX) 40 MG tablet Take 1 tablet (40 mg total) by mouth 2 (two) times daily.  Marland Kitchen gabapentin (NEURONTIN) 100 MG capsule TK 1 C PO TID  . glucose blood (FREESTYLE TEST STRIPS) test strip Use as instructed  . glucose monitoring kit (FREESTYLE) monitoring kit 1 each by  Does not apply route 4 (four) times daily - after meals and at bedtime. 1 month Diabetic Testing Supplies for QAC-QHS accuchecks.  Marland Kitchen HUMALOG KWIKPEN 100 UNIT/ML KwikPen INJECT 10 UNITS INTO THE SKIN WITH EACH MEAL  . Insulin Glargine (LANTUS SOLOSTAR) 100 UNIT/ML Solostar Pen Inject 50 Units into the skin daily at 10 pm.  . Insulin Pen Needle 32G X 8 MM MISC Use as directed  . isosorbide-hydrALAZINE (BIDIL) 20-37.5 MG tablet Take 2 tablets by mouth 3 (three) times daily.  . Lancets (FREESTYLE) lancets Use as instructed  . metFORMIN (GLUCOPHAGE-XR) 500 MG 24 hr tablet TK 2 TS PO BID  . potassium chloride SA (KLOR-CON) 20 MEQ tablet TAKE 3 TABLETS(60 MEQ) BY MOUTH DAILY  . sacubitril-valsartan (ENTRESTO) 97-103 MG Take 1 tablet by mouth 2 (two) times daily.  Marland Kitchen spironolactone (ALDACTONE) 25 MG tablet TAKE 1 TABLET(25 MG) BY MOUTH DAILY  . Tafamidis (VYNDAMAX) 61 MG CAPS Take 61 mg by mouth daily.   No current facility-administered medications for this visit. (Other)      REVIEW OF SYSTEMS:    ALLERGIES Allergies  Allergen Reactions  . Morphine And Related Nausea Only    Severe nausea  . Penicillins Other (See Comments)    Pt states she has had a pain in her leg since a penicillin injection 2 months ago (reported 08/31/17).  Has tolerated amoxicillin oral.  Has patient had a PCN reaction causing immediate rash, facial/tongue/throat swelling, SOB  or lightheadedness with hypotension: No Has patient had a PCN reaction causing severe rash involving mucus membranes or skin necrosis: No Has patient had a PCN reaction that required hospitalization: No Has patient had a PCN reaction occurring within the last 10 years: Yes If a    PAST MEDICAL HISTORY Past Medical History:  Diagnosis Date  . Anemia    a. Noted on 07/2012 labs, instructed to f/u PCP.  Marland Kitchen Arthritis    "joints" (11/18/2012)  . CAD (coronary artery disease), native coronary artery    a. Nonobstructive by cath 02/2012 (done  because of low EF).  . Chronic bronchitis ([place])    "~ every other year" (11/18/2012)  . Chronic combined systolic and diastolic CHF (congestive heart failure) ([place])    a. 03/05/12 echo:  LVEF 20-25%, moderate LVH , inferior and basal to mid septal akinesis, anterior moderate to severe hypokinesis and grade 2 diastolic dysfunction. b. EF 07/2012: EF still 25% (unclear medication compliance).  . Chronic lower back pain   . Headache(784.0)    "often; maybe not daily" (11/18/2012)  . High cholesterol   . History of noncompliance with medical treatment   . Hypertension   . LBBB (left bundle branch block)   . Orthopnea   . Tobacco abuse   . Type II diabetes mellitus ([place])    Past Surgical History:  Procedure Laterality Date  . BI-VENTRICULAR IMPLANTABLE CARDIOVERTER DEFIBRILLATOR N/A 11/18/2012   Procedure: BI-VENTRICULAR IMPLANTABLE CARDIOVERTER DEFIBRILLATOR  (CRT-D);  Surgeon: Evans Lance, MD;  Location: [facility] CATH LAB;  Service: Cardiovascular;  Laterality: N/A;  . BI-VENTRICULAR IMPLANTABLE CARDIOVERTER DEFIBRILLATOR  (CRT-D)  11/18/2012  . CARDIAC CATHETERIZATION  03/04/12   nonobstructive CAD, elevated LVEDP and tortuous vessels suggestive of long-standing hypertension  . COLONOSCOPY WITH PROPOFOL N/A 12/12/2016   Procedure: COLONOSCOPY WITH PROPOFOL;  Surgeon: Carol Ada, MD;  Location: [facility] ENDOSCOPY;  Service: Endoscopy;  Laterality: N/A;  . JOINT REPLACEMENT     Bilateral hip and right knee  . LEFT HEART CATH N/A 03/05/2012   Procedure: LEFT HEART CATH;  Surgeon: Larey Dresser, MD;  Location[facility] LAB;  Service: Cardiovascular;  Laterality: N/A;    FAMILY HISTORY Family History  Problem Relation Age of Onset  . Heart failure Mother   . Heart disease Neg Hx     SOCIAL HISTORY Social History   Tobacco Use  . Smoking status: Former Smoker    Packs/day: 1.00    Years: 40.00    Pack years: 40.00    Types: Cigarettes    Start date: 06/23/1972    Quit date: 03/26/2012     Years since quitting: 7.7  . Smokeless tobacco: Never Used  Vaping Use  . Vaping Use: Never used  Substance Use Topics  . Alcohol use: No  . Drug use: No         OPHTHALMIC EXAM:  Not recorded     IMAGING AND PROCEDURES  Imaging and Procedures for _0 @           ASSESSMENT/PLAN:    ICD-10-CM   1. Proliferative retinopathy of right eye  H35.21   2. BRAO (branch retinal artery occlusion), right  H34.231   3. Epiretinal membrane (ERM) of right eye  H35.371   4. Retinal edema  H35.81   5. Moderate nonproliferative diabetic retinopathy of both eyes without macular edema associated with type 2 diabetes mellitus ([place])  V49.4496   6. Essential hypertension  I10   7. Hypertensive retinopathy of both  eyes  H35.033   8. Pseudophakia of both eyes  Z96.1   9. Combined forms of age-related cataract of left eye  H25.812   10. Pseudophakia  Z96.1   11. Combined forms of age-related cataract of both eyes  H25.813     1,2. Proliferative retinopathy and BRAO OD  - sclerotic superior arteriole with distal fibrotic NV (?sea fan) and overlying preretinal and vitreous heme  - etiology could also be DM2 vs sickle cell retinopathy -- sickle cell screen negative  - s/p segmental PRP OD (2.17.2020) + fill-in PRP OD 7.13.20 - good laser changes in place  - repeat FA (3.12.21) shows interval improvement in NV and leakage  - improved view post cataract surgery  - f/u 9 months, DFE/OCT  3,4. Epiretinal membrane, OD  - fairly thick ERM overlying retina with pucker and distorted foveal profile  - OCT stable from prior, but  BCVA OD drastically improved to 20/20 from 20/60 post cataract surgery  - discussed finding and prognosis and treatment options in detail  - pt happy with vision OD and wishes to continue to monitor ERM for now  - f/u 9 months, sooner prn  5. Moderate nonproliferative diabetic retinopathy w/o DME, OS  - exam shows scattered MA OS, no NV  - FA (02.17.20) shows no NV  OS  - OCT without diabetic macular edema, OU  - monitor  6,7. Hypertensive retinopathy OU  - discussed importance of tight BP control  - monitor  8. Pseudophakia OU  - s/p CE/IOL (OD: Dr. Shirleen Schirmer, Jun 10, 2018, OS: Dr. Shirleen Schirmer, September 16, 2018 )  - beautiful surgeries, doing well  - monitor   Ophthalmic Meds Ordered this visit:  No orders of the defined types were placed in this encounter.      No follow-ups on file.  There are no Patient Instructions on file for this visit.  This document serves as a record of services personally performed by Gardiner Sleeper, MD, PhD. It was created on their behalf by Leeann Must, [place], an ophthalmic technician. The creation of this record is the provider's dictation and/or activities during the visit.    Electronically signed by: Leeann Must, COA _0 @ 8:09 AM   Abbreviations: M myopia (nearsighted); A astigmatism; H hyperopia (farsighted); P presbyopia; Mrx spectacle prescription;  CTL contact lenses; OD right eye; OS left eye; OU both eyes  XT exotropia; ET esotropia; PEK punctate epithelial keratitis; PEE punctate epithelial erosions; DES dry eye syndrome; MGD meibomian gland dysfunction; ATs artificial tears; PFAT's preservative free artificial tears; [place] nuclear sclerotic cataract; PSC posterior subcapsular cataract; ERM epi-retinal membrane; PVD posterior vitreous detachment; RD retinal detachment; DM diabetes mellitus; DR diabetic retinopathy; NPDR non-proliferative diabetic retinopathy; PDR proliferative diabetic retinopathy; CSME clinically significant macular edema; DME diabetic macular edema; dbh dot blot hemorrhages; CWS cotton wool spot; POAG primary open angle glaucoma; C/D cup-to-disc ratio; HVF humphrey visual field; GVF goldmann visual field; OCT optical coherence tomography; IOP intraocular pressure; BRVO Branch retinal vein occlusion; CRVO central retinal vein occlusion; CRAO central retinal artery occlusion; BRAO branch  retinal artery occlusion; RT retinal tear; SB scleral buckle; PPV pars plana vitrectomy; VH Vitreous hemorrhage; PRP panretinal laser photocoagulation; IVK intravitreal kenalog; VMT vitreomacular traction; MH Macular hole;  NVD neovascularization of the disc; NVE neovascularization elsewhere; AREDS age related eye disease study; ARMD age related macular degeneration; POAG primary open angle glaucoma; EBMD epithelial/anterior basement membrane dystrophy; ACIOL anterior chamber intraocular lens; IOL intraocular lens; PCIOL posterior chamber  intraocular lens; Phaco/IOL phacoemulsification with intraocular lens placement; [place] photorefractive keratectomy; LASIK laser assisted in situ keratomileusis; HTN hypertension; DM diabetes mellitus; COPD chronic obstructive pulmonary disease

## 2020-01-06 ENCOUNTER — Telehealth (HOSPITAL_COMMUNITY): Payer: Self-pay | Admitting: Vascular Surgery

## 2020-01-09 ENCOUNTER — Encounter (INDEPENDENT_AMBULATORY_CARE_PROVIDER_SITE_OTHER): Payer: Medicare Other | Admitting: Ophthalmology

## 2020-01-09 DIAGNOSIS — H3581 Retinal edema: Secondary | ICD-10-CM

## 2020-01-09 DIAGNOSIS — H25813 Combined forms of age-related cataract, bilateral: Secondary | ICD-10-CM

## 2020-01-09 DIAGNOSIS — I10 Essential (primary) hypertension: Secondary | ICD-10-CM

## 2020-01-09 DIAGNOSIS — E113393 Type 2 diabetes mellitus with moderate nonproliferative diabetic retinopathy without macular edema, bilateral: Secondary | ICD-10-CM

## 2020-01-09 DIAGNOSIS — Z961 Presence of intraocular lens: Secondary | ICD-10-CM

## 2020-01-09 DIAGNOSIS — H35371 Puckering of macula, right eye: Secondary | ICD-10-CM

## 2020-01-09 DIAGNOSIS — H25812 Combined forms of age-related cataract, left eye: Secondary | ICD-10-CM

## 2020-01-09 DIAGNOSIS — H35033 Hypertensive retinopathy, bilateral: Secondary | ICD-10-CM

## 2020-01-09 DIAGNOSIS — H3521 Other non-diabetic proliferative retinopathy, right eye: Secondary | ICD-10-CM

## 2020-01-09 DIAGNOSIS — H34231 Retinal artery branch occlusion, right eye: Secondary | ICD-10-CM

## 2020-01-09 NOTE — Telephone Encounter (Signed)
Encounter open in error 

## 2020-01-10 ENCOUNTER — Encounter (HOSPITAL_COMMUNITY): Payer: Self-pay | Admitting: Cardiology

## 2020-01-10 ENCOUNTER — Ambulatory Visit (HOSPITAL_COMMUNITY)
Admission: RE | Admit: 2020-01-10 | Discharge: 2020-01-10 | Disposition: A | Discharge status: Home / Self Care | Payer: Medicare Other | Source: Ambulatory Visit | Attending: Cardiology | Admitting: Cardiology

## 2020-01-10 ENCOUNTER — Other Ambulatory Visit: Payer: Self-pay

## 2020-01-10 VITALS — BP 112/70 | HR 84 | Wt 240.8 lb

## 2020-01-10 DIAGNOSIS — I110 Hypertensive heart disease with heart failure: Secondary | ICD-10-CM | POA: Diagnosis not present

## 2020-01-10 DIAGNOSIS — I447 Left bundle-branch block, unspecified: Secondary | ICD-10-CM | POA: Insufficient documentation

## 2020-01-10 DIAGNOSIS — E785 Hyperlipidemia, unspecified: Secondary | ICD-10-CM | POA: Insufficient documentation

## 2020-01-10 DIAGNOSIS — I480 Paroxysmal atrial fibrillation: Secondary | ICD-10-CM | POA: Diagnosis not present

## 2020-01-10 DIAGNOSIS — Z7901 Long term (current) use of anticoagulants: Secondary | ICD-10-CM | POA: Insufficient documentation

## 2020-01-10 DIAGNOSIS — I428 Other cardiomyopathies: Secondary | ICD-10-CM | POA: Insufficient documentation

## 2020-01-10 DIAGNOSIS — I2510 Atherosclerotic heart disease of native coronary artery without angina pectoris: Secondary | ICD-10-CM | POA: Diagnosis not present

## 2020-01-10 DIAGNOSIS — G4719 Other hypersomnia: Secondary | ICD-10-CM | POA: Diagnosis not present

## 2020-01-10 DIAGNOSIS — Z87891 Personal history of nicotine dependence: Secondary | ICD-10-CM | POA: Insufficient documentation

## 2020-01-10 DIAGNOSIS — Z8616 Personal history of COVID-19: Secondary | ICD-10-CM | POA: Insufficient documentation

## 2020-01-10 DIAGNOSIS — G4733 Obstructive sleep apnea (adult) (pediatric): Secondary | ICD-10-CM | POA: Diagnosis not present

## 2020-01-10 DIAGNOSIS — I5022 Chronic systolic (congestive) heart failure: Secondary | ICD-10-CM | POA: Diagnosis not present

## 2020-01-10 DIAGNOSIS — Z794 Long term (current) use of insulin: Secondary | ICD-10-CM | POA: Insufficient documentation

## 2020-01-10 DIAGNOSIS — E1140 Type 2 diabetes mellitus with diabetic neuropathy, unspecified: Secondary | ICD-10-CM | POA: Diagnosis not present

## 2020-01-10 DIAGNOSIS — Z79899 Other long term (current) drug therapy: Secondary | ICD-10-CM | POA: Insufficient documentation

## 2020-01-10 LAB — BASIC METABOLIC PANEL
Anion gap: 13 (ref 5–15)
BUN: 21 mg/dL (ref 8–23)
CO2: 27 mmol/L (ref 22–32)
Calcium: 9 mg/dL (ref 8.9–10.3)
Chloride: 99 mmol/L (ref 98–111)
Creatinine, Ser: 1.08 mg/dL — ABNORMAL HIGH (ref 0.44–1.00)
GFR, Estimated: 56 mL/min — ABNORMAL LOW (ref >60)
Glucose, Bld: 376 mg/dL — ABNORMAL HIGH (ref 70–99)
Potassium: 3.4 mmol/L — ABNORMAL LOW (ref 3.5–5.1)
Sodium: 139 mmol/L (ref 135–145)

## 2020-01-10 LAB — CBC
HCT: 32.7 % — ABNORMAL LOW (ref 36.0–46.0)
Hemoglobin: 10.9 g/dL — ABNORMAL LOW (ref 12.0–15.0)
MCH: 30 pg (ref 26.0–34.0)
MCHC: 33.3 g/dL (ref 30.0–36.0)
MCV: 90.1 fL (ref 80.0–100.0)
Platelets: 350 10*3/uL (ref 150–400)
RBC: 3.63 MIL/uL — ABNORMAL LOW (ref 3.87–5.11)
RDW: 12.4 % (ref 11.5–15.5)
WBC: 7.5 10*3/uL (ref 4.0–10.5)
nRBC: 0 % (ref 0.0–0.2)

## 2020-01-10 LAB — HEMOGLOBIN A1C
Hgb A1c MFr Bld: 8 % — ABNORMAL HIGH (ref 4.8–5.6)
Mean Plasma Glucose: 182.9 mg/dL

## 2020-01-10 MED ORDER — FUROSEMIDE 40 MG PO TABS
ORAL_TABLET | ORAL | 3 refills | Status: DC
Start: 2020-01-10 — End: 2020-03-15

## 2020-01-10 NOTE — Patient Instructions (Addendum)
Increase Furosemide to 60 mg in the AM,40 mg in the PM  Labs done today, your results will be available in MyChart, we will contact you for abnormal readings.  Please check your b/p for a week and call with results  Your physician recommends that you schedule a follow-up appointment in: 6 weeks  Follow up lab work for 10 days  Your provider has recommended that you have a home sleep study.  This has to be approved by your insurance company.authorization .We will call you to come in for equipment. Once you have the equipment you will download the app on your phone and follow the instructions. YOUR PIN NUMBER IS: [id number]. Once you have completed the test the information is sent to the company through [employer] and you can dispose of the equipment. If your test is positive you will receive a call from Dr Theodosia Blender office [facility]) to set up your CPAP equipment.  If you have any questions or concerns before your next appointment please send [place] a message through [place] or call our office at [phone].    TO LEAVE A MESSAGE FOR THE NURSE SELECT OPTION 2, PLEASE LEAVE A MESSAGE INCLUDING: . YOUR NAME . DATE OF BIRTH . CALL BACK NUMBER . REASON FOR CALL**this is important as we prioritize the call backs  [place] AS LONG AS YOU CALL BEFORE 4:00 PM At the [facility], you and your health needs are our priority. As part of our continuing mission to provide you with exceptional heart care, we have created designated Provider Care Teams. These Care Teams include your primary Cardiologist (physician) and Advanced Practice Providers (APPs- Physician Assistants and Nurse Practitioners) who all work together to provide you with the care you need, when you need it.   You may see any of the following providers on your designated Care Team at your next follow up: Marland Kitchen Dr Glori Bickers . Dr [name] . Darrick Grinder, NP . Lyda Jester,  PA . Audry Riles, PharmD   Please be sure to bring in all your medications bottles to every appointment.

## 2020-01-10 NOTE — Progress Notes (Signed)
Height: 5'7"    Weight:240.8 lbs BMI:37.71  Today's Date:01/10/2020  STOP BANG RISK ASSESSMENT S (snore) Have you been told that you snore?     YES/   T (tired) Are you often tired, fatigued, or sleepy during the day?   YES/  O (obstruction) Do you stop breathing, choke, or gasp during sleep? /NO   P (pressure) Do you have or are you being treated for high blood pressure? YES/   B (BMI) Is your body index greater than 35 kg/m? YES/  A (age) Are you 68 years old or older? YES/   N (neck) Do you have a neck circumference greater than 16 inches?   YES/NO   G (gender) Are you a female? YES/  TOTAL STOP/BANG "YES" ANSWERS 7                                                                       For Office Use Only              Procedure Order Form    YES to 3+ Stop Bang questions OR two clinical symptoms - patient qualifies for WatchPAT (CPT 95800)      Clinical Notes: Will consult Sleep Specialist and refer for management of therapy due to patient increased risk of Sleep Apnea. Ordering a sleep study due to the following two clinical symptoms: Excessive daytime sleepiness G47.10.9 / History of high blood pressure R03.0 /

## 2020-01-10 NOTE — Progress Notes (Addendum)
[facility] Note  01/12/2020     CHIEF COMPLAINT Patient presents for Retina Follow Up   HISTORY OF PRESENT ILLNESS: [name] is a 69 y.o. female who presents to the clinic today for:   HPI    Retina Follow Up    Patient presents with  Diabetic Retinopathy.  In both eyes.  This started weeks ago.  Severity is moderate.  Duration of weeks.  Since onset it is stable.  I, the attending physician,  performed the HPI with the patient and updated documentation appropriately.          Comments    Pt states vision has been the same.  Pt had recent visit with emergency department for acute conjunctivitis OS--patient states she checked into ER because she thought something flew into left eye.  Patient was given erythromycin ung--states she has not had any new symptoms since start of this medication.  Patient denies eye pain or discomfort and denies any new or worsening floaters or fol OU.       Last edited by Bernarda Caffey, MD on 01/12/2020 10:47 PM. (History)    pt states vision seems stable.  Was seen in ER last week for conjunctivitis OS--pt states this is resolved.   Referring physician: [facility] [address] [place],  [place] [address]  HISTORICAL INFORMATION:   Selected notes from the MEDICAL RECORD NUMBER Referred by Dr. Thurston Hole for concern of BRVO OD / ERM OD LEE: 02.10.20 (S. Bernstorf) [BCVA: OD: 20/25- OS: 20/25] Ocular Hx-DES OU, narrow angles PMH-DM (metformin, novolin, lantus), HTN, HLD, heart disease    CURRENT MEDICATIONS: Current Outpatient Medications (Ophthalmic Drugs)  Medication Sig  . erythromycin ophthalmic ointment Place a 1/2 inch ribbon of ointment into the lower eyelid up to 6 times a day for 3-5 days  . XIIDRA 5 % SOLN Place 1 drop into both eyes 2 (two) times daily.   No current facility-administered medications for this visit. (Ophthalmic Drugs)   Current Outpatient Medications  (Other)  Medication Sig  . albuterol (PROVENTIL HFA;VENTOLIN HFA) 108 (90 Base) MCG/ACT inhaler Inhale 2 puffs into the lungs every 6 (six) hours as needed for wheezing or shortness of breath.  Marland Kitchen atorvastatin (LIPITOR) 40 MG tablet TAKE 1 TABLET(40 MG) BY MOUTH DAILY  . baclofen (LIORESAL) 10 MG tablet TK 1 T PO BID PRN  . carvedilol (COREG) 25 MG tablet TAKE 1 TABLET(25 MG) BY MOUTH TWICE DAILY WITH A MEAL  . ELIQUIS 5 MG TABS tablet TAKE 1 TABLET(5 MG) BY MOUTH TWICE DAILY  . furosemide (LASIX) 40 MG tablet Take 1.5 tablets (60 mg total) by mouth in the morning AND 1 tablet (40 mg total) every evening.  . gabapentin (NEURONTIN) 100 MG capsule TK 1 C PO TID  . glucose blood (FREESTYLE TEST STRIPS) test strip Use as instructed  . glucose monitoring kit (FREESTYLE) monitoring kit 1 each by Does not apply route 4 (four) times daily - after meals and at bedtime. 1 month Diabetic Testing Supplies for QAC-QHS accuchecks.  Marland Kitchen HUMALOG KWIKPEN 100 UNIT/ML KwikPen Sliding scale  . insulin glargine (LANTUS) 100 UNIT/ML Solostar Pen Inject 60 Units into the skin at bedtime.  . Insulin Pen Needle 32G X 8 MM MISC Use as directed  . isosorbide-hydrALAZINE (BIDIL) 20-37.5 MG tablet Take 2 tablets by mouth 3 (three) times daily.  . Lancets (FREESTYLE) lancets Use as instructed  . metFORMIN (GLUCOPHAGE) 1000  MG tablet Take 1,000 mg by mouth 2 (two) times daily with a meal.  . Oxycodone HCl 10 MG TABS Take 10 mg by mouth 3 (three) times daily as needed. (Patient not taking: Reported on 01/12/2020)  . potassium chloride SA (KLOR-CON) 20 MEQ tablet TAKE 3 TABLETS(60 MEQ) BY MOUTH DAILY  . sacubitril-valsartan (ENTRESTO) 97-103 MG Take 1 tablet by mouth 2 (two) times daily.  Marland Kitchen spironolactone (ALDACTONE) 25 MG tablet TAKE 1 TABLET(25 MG) BY MOUTH DAILY  . Tafamidis (VYNDAMAX) 61 MG CAPS Take 61 mg by mouth daily.   No current facility-administered medications for this visit. (Other)      REVIEW OF SYSTEMS: ROS     Positive for: Endocrine, Cardiovascular, Eyes, Respiratory   Negative for: Constitutional, Gastrointestinal, Neurological, Skin, Genitourinary, Musculoskeletal, HENT, Psychiatric, Allergic/Imm, Heme/Lymph   Last edited by Doneen Poisson on 01/12/2020  9:05 AM. (History)       ALLERGIES Allergies  Allergen Reactions  . Morphine And Related Nausea Only    Severe nausea  . Penicillins Other (See Comments)    Pt states she has had a pain in her leg since a penicillin injection 2 months ago (reported 08/31/17).  Has tolerated amoxicillin oral.  Has patient had a PCN reaction causing immediate rash, facial/tongue/throat swelling, SOB or lightheadedness with hypotension: No Has patient had a PCN reaction causing severe rash involving mucus membranes or skin necrosis: No Has patient had a PCN reaction that required hospitalization: No Has patient had a PCN reaction occurring within the last 10 years: Yes If a    PAST MEDICAL HISTORY Past Medical History:  Diagnosis Date  . Anemia    a. Noted on 07/2012 labs, instructed to f/u PCP.  Marland Kitchen Arthritis    "joints" (11/18/2012)  . CAD (coronary artery disease), native coronary artery    a. Nonobstructive by cath 02/2012 (done because of low EF).  . Chronic bronchitis ([place])    "~ every other year" (11/18/2012)  . Chronic combined systolic and diastolic CHF (congestive heart failure) ([place])    a. 03/05/12 echo:  LVEF 20-25%, moderate LVH , inferior and basal to mid septal akinesis, anterior moderate to severe hypokinesis and grade 2 diastolic dysfunction. b. EF 07/2012: EF still 25% (unclear medication compliance).  . Chronic lower back pain   . Headache(784.0)    "often; maybe not daily" (11/18/2012)  . High cholesterol   . History of noncompliance with medical treatment   . Hypertension   . LBBB (left bundle branch block)   . Orthopnea   . Tobacco abuse   . Type II diabetes mellitus ([place])    Past Surgical History:  Procedure Laterality  Date  . BI-VENTRICULAR IMPLANTABLE CARDIOVERTER DEFIBRILLATOR N/A 11/18/2012   Procedure: BI-VENTRICULAR IMPLANTABLE CARDIOVERTER DEFIBRILLATOR  (CRT-D);  Surgeon: Evans Lance, MD;  Location: [facility] CATH LAB;  Service: Cardiovascular;  Laterality: N/A;  . BI-VENTRICULAR IMPLANTABLE CARDIOVERTER DEFIBRILLATOR  (CRT-D)  11/18/2012  . CARDIAC CATHETERIZATION  03/04/12   nonobstructive CAD, elevated LVEDP and tortuous vessels suggestive of long-standing hypertension  . COLONOSCOPY WITH PROPOFOL N/A 12/12/2016   Procedure: COLONOSCOPY WITH PROPOFOL;  Surgeon: Carol Ada, MD;  Location: [facility] ENDOSCOPY;  Service: Endoscopy;  Laterality: N/A;  . JOINT REPLACEMENT     Bilateral hip and right knee  . LEFT HEART CATH N/A 03/05/2012   Procedure: LEFT HEART CATH;  Surgeon: Larey Dresser, MD;  Location: [facility] CATH LAB;  Service: Cardiovascular;  Laterality: N/A;    FAMILY HISTORY Family History  Problem Relation Age of Onset  . Heart failure Mother   . Heart disease Neg Hx     SOCIAL HISTORY Social History   Tobacco Use  . Smoking status: Former Smoker    Packs/day: 1.00    Years: 40.00    Pack years: 40.00    Types: Cigarettes    Start date: 06/23/1972    Quit date: 03/26/2012    Years since quitting: 7.8  . Smokeless tobacco: Never Used  Vaping Use  . Vaping Use: Never used  Substance Use Topics  . Alcohol use: No  . Drug use: No         OPHTHALMIC EXAM:  Base Eye Exam    Visual Acuity (Snellen - Linear)      Right Left   Dist [place] 20/25 -2 20/20 -2   Dist ph [place] NI        Tonometry (Tonopen, 9:04 AM)      Right Left   Pressure 17 16       Pupils      Dark Light Shape React APD   Right 2 1 Round Minimal 0   Left 2 1 Round Minimal 0       Visual Fields      Left Right    Full Full       Extraocular Movement      Right Left    0 0 0  0  0  0 0 0   0 0 0  0  0  0 0 0         Neuro/Psych    Oriented x3: Yes   Mood/Affect: Normal       Dilation    Both eyes:  1.0% Mydriacyl, 2.5% Phenylephrine @ 9:04 AM        Slit Lamp and Fundus Exam    Slit Lamp Exam      Right Left   Lids/Lashes Dermatochalasis - upper lid Dermatochalasis - upper lid   Conjunctiva/Sclera nasal Pinguecula, Melanosis nasal/temporal Pinguecula, Melanosis--no injection   Cornea Trace Punctate epithelial erosions, Well healed  IT cataract wounds 1+ Punctate epithelial erosions, well healed temporal cataract wounds   Anterior Chamber Deep and quiet deep, 0.5+ cell/pigment   Iris Round and dilated, No NVI Round and dilated, No NVI   Lens PC IOL in good position PC IOL in good position, 1   Vitreous Vitreous syneresis, mild cell/pigment on anterior vitreous Vitreous syneresis       Fundus Exam      Right Left   Disc +cupping, pallor, mild tilt, temporal PPA, Pink and Sharp sharp rim, +cupping, temporal PPA, mild tilt, Pallor   C/D Ratio 0.6 0.8   Macula Flat, Blunted foveal reflex, 3-4+ERM, no heme Good foveal reflex, stable improvement in cystic changes, scattered MAs/IRH   Vessels Vascular attenuation, veins dilated/tortuous, AV crossing changes, white, sclerotic arteriole / BRAO at 1200 - good laser surrounding Vascular attenuation, Tortuous, Copper wiring, AV crossing changes   Periphery Attached, fibrotic NV at 1200 -- regressing ?seafan, good segmental PRP laser changes in area of BRAO and NV, focal blot heme nasal to disc, scattered punctate IRH;  pigmented cystoid degeneration at 0600 Attached, scattered MA/DBH          IMAGING AND PROCEDURES  Imaging and Procedures for '@TODAY' @  OCT, Retina - OU - Both Eyes       Right Eye Quality was good. Central Foveal Thickness: 462. Progression has been stable. Findings include abnormal foveal  contour, epiretinal membrane, no IRF, no SRF, preretinal fibrosis, macular pucker (Thick ERM - no change from prior).   Left Eye Quality was good. Central Foveal Thickness: 236. Progression has been stable. Findings include normal  foveal contour, no SRF, vitreomacular adhesion , no IRF.   Notes *Images captured and stored on drive  Diagnosis / Impression:  OD: thick ERM with macular pucker -- stable from prior OS: NFP, no IRF/SRF, mild VMA -- stable resolution of cystic changes  Clinical management:  See below  Abbreviations: NFP - Normal foveal profile. CME - cystoid macular edema. PED - pigment epithelial detachment. IRF - intraretinal fluid. SRF - subretinal fluid. EZ - ellipsoid zone. ERM - epiretinal membrane. ORA - outer retinal atrophy. ORT - outer retinal tubulation. SRHM - subretinal hyper-reflective material                 ASSESSMENT/PLAN:    ICD-10-CM   1. Proliferative retinopathy of right eye  H35.21   2. BRAO (branch retinal artery occlusion), right  H34.231   3. Epiretinal membrane (ERM) of right eye  H35.371   4. Retinal edema  H35.81 OCT, Retina - OU - Both Eyes  5. Moderate nonproliferative diabetic retinopathy of both eyes without macular edema associated with type 2 diabetes mellitus ([place])  U54.2706   6. Essential hypertension  I10   7. Hypertensive retinopathy of both eyes  H35.033   8. Pseudophakia of both eyes  Z96.1     1,2. Proliferative retinopathy and BRAO OD -- stable  - sclerotic superior arteriole with distal fibrotic NV (?sea fan) and overlying preretinal and vitreous heme -- improved  - etiology could also be DM2 vs sickle cell retinopathy -- sickle cell screen negative  - s/p segmental PRP OD (2.17.2020) + fill-in PRP OD 7.13.20 - good laser changes in place  - repeat FA (3.12.21) shows interval improvement in NV and leakage  - improved view post cataract surgery  - f/u 12 months, DFE/OCT  3,4. Epiretinal membrane, OD -- stable  - thick ERM overlying retina with pucker and distorted foveal profile  - OCT stable from prior, but BCVA OD drastically improved to 20/25 from 20/60 post cataract surgery  - discussed finding and prognosis and treatment options in  detail  - pt happy with vision OD and wishes to continue to monitor ERM for now  - f/u 12 months, sooner prn  5. Moderate nonproliferative diabetic retinopathy w/o DME, OU  - exam shows scattered MA OU, no NV  - FA (02.17.20) shows no NV OS  - OCT without diabetic macular edema, OU  - monitor  6,7. Hypertensive retinopathy OU  - discussed importance of tight BP control  - monitor  8. Pseudophakia OU  - s/p CE/IOL (OD: Dr. Shirleen Schirmer, Jun 10, 2018, OS: Dr. Shirleen Schirmer, September 16, 2018 )  - beautiful surgeries, doing well  - monitor   Ophthalmic Meds Ordered this visit:  No orders of the defined types were placed in this encounter.      Return in about 1 year (around 01/11/2021) for Dilated exam, OCT.  There are no Patient Instructions on file for this visit.   Explained the diagnoses, plan, and follow up with the patient and they expressed understanding.  Patient expressed understanding of the importance of proper follow up care.   This document serves as a record of services personally performed by Gardiner Sleeper, MD, PhD. It was created on their behalf by Leonie Douglas, an ophthalmic  technician. The creation of this record is the provider's dictation and/or activities during the visit.    Electronically signed by: Leonie Douglas COA, 01/12/20  10:52 PM   Gardiner Sleeper, M.D., Ph.D. Diseases & Surgery of the Retina and Vitreous [facility]  I have reviewed the above documentation for accuracy and completeness, and I agree with the above. Gardiner Sleeper, M.D., Ph.D. 01/12/20 10:52 PM   Abbreviations: M myopia (nearsighted); A astigmatism; H hyperopia (farsighted); P presbyopia; Mrx spectacle prescription;  CTL contact lenses; OD right eye; OS left eye; OU both eyes  XT exotropia; ET esotropia; PEK punctate epithelial keratitis; PEE punctate epithelial erosions; DES dry eye syndrome; MGD meibomian gland dysfunction; ATs artificial tears; PFAT's  preservative free artificial tears; [place] nuclear sclerotic cataract; PSC posterior subcapsular cataract; ERM epi-retinal membrane; PVD posterior vitreous detachment; RD retinal detachment; DM diabetes mellitus; DR diabetic retinopathy; NPDR non-proliferative diabetic retinopathy; PDR proliferative diabetic retinopathy; CSME clinically significant macular edema; DME diabetic macular edema; dbh dot blot hemorrhages; CWS cotton wool spot; POAG primary open angle glaucoma; C/D cup-to-disc ratio; HVF humphrey visual field; GVF goldmann visual field; OCT optical coherence tomography; IOP intraocular pressure; BRVO Branch retinal vein occlusion; CRVO central retinal vein occlusion; CRAO central retinal artery occlusion; BRAO branch retinal artery occlusion; RT retinal tear; SB scleral buckle; PPV pars plana vitrectomy; VH Vitreous hemorrhage; PRP panretinal laser photocoagulation; IVK intravitreal kenalog; VMT vitreomacular traction; MH Macular hole;  NVD neovascularization of the disc; NVE neovascularization elsewhere; AREDS age related eye disease study; ARMD age related macular degeneration; POAG primary open angle glaucoma; EBMD epithelial/anterior basement membrane dystrophy; ACIOL anterior chamber intraocular lens; IOL intraocular lens; PCIOL posterior chamber intraocular lens; Phaco/IOL phacoemulsification with intraocular lens placement; [place] photorefractive keratectomy; LASIK laser assisted in situ keratomileusis; HTN hypertension; DM diabetes mellitus; COPD chronic obstructive pulmonary disease

## 2020-01-11 NOTE — Progress Notes (Signed)
Patient ID: [name], female   DOB: 1950-04-28, 69 y.o.   MRN: [id number]   Advanced Heart Failure Clinic Note   PCP: Dr. Alyson Ingles Cardiology: Dr Dora Sims is a 69 y.o. female with history of nonischemic cardiomyopathy.  She was admitted with CHF exacerbation in 02/2012.  EF 20-25% on echo, LHC with nonobstructive CAD.  She was started on cardiac meds and discharged.  In 07/2012, she was admitted again with CHF exacerbation.  She had run out of Lasix.  She was taking her other heart medications as ordered, however.  She was diuresed and discharged. She had a chronic LBBB, and Medtronic CRT-D device was placed in 10/14.  She was admitted in 6/16 with hypertensive emergency and CHF exacerbation.   Also of note, she had PFTs in 9/16 showing restrictive spirometry with low lung volume and DLCO.  She was supposed to get a high resolution CT to evaluate for interstitial lung disease but never had the study.     Admitted March 2018 with urosepsis--> E Coli Bacteremia. Completed antibiotic course. EF was down from previous to 30-35%. Also had atrial fibrillation so she was loaded on amiodarone. Placed on eliquis. Discharge weight was 208 pounds.  She is now off amiodarone.   Admitted to [facility] 1/16 -> 02/13/16 with CP and dizziness. Found to be in Afib. Converted spontaneously overnight with BB and diuresis.   Echo 11/19 showed EF 35-40%.  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    Patient had COVID-19 infection in 10/20.   Echo in 11/20 showed EF 40-45% with mild LVH and mildly decreased RV systolic function.   She returns for followup of CHF.  Weight is up 3 lbs. No dyspnea walking on flat ground. She gets short of breath walking up stairs.  Chronic orthopnea, sleeps on multiple pillows.  She is sleepy during the day and snores.  No chest pain.      Labs (2/14): SPEP negative, UPEP negative, HIV negative Labs (04/14/2017): K 3.8  Creatinine 0.94  Labs (9/19): K 3.3, creatinine 0.86 Labs (1/20): K 4, creatinine 0.87 Labs (2/20): K 4.2, creatinine 1.16 Labs (8/20): K 3.8, creatinine 0.78 Labs (11/20): LDL 104 Labs (12/20): K 3.6, creatinine 0.93 Labs (7/21): K 3.5, creatinine 1.0  PMH: 1. HTN 2. Type II diabetes with neuropathy 3. Nonischemic cardiomyopathy: ? Due to HTN versus LBBB CMP.  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  Echo (2/14) with EF 20-25%.  Echo (7/14) with EF 25%, diffuse hypokinesis.  HIV, SPEP, UPEP negative.  Has LBBB. CRT-D 10/2012 (Medtronic).  Echo (4/15) with EF 45-50%, mild diffuse hypokinesis, PA systolic pressure 38 mmHg.  Echo (6/16) with EF 40-45%, mild LVH, septal and inferior hypokinesis.   - Echo (3/18): EF 30-35%. Grade 1 DD - Echo (6/18): EF 45-50%, moderate LVH, normal RV size with mildly decreased systolic function.  - Echo (11/19): EF 35-40%, moderate LVH, moderate diastolic dysfunction, normal RV size with mildly decreased systolic function, PASP 43 mmHg.  - Echo (11/20): EF 40-45%, mild LVH, mildly decreased RV systolic function.  4. Chronic LBBB 5. Right TKR 6. Bilateral THR.  7. Hyperlipidemia 8. ?COPD: Has oxygen for use with exertion.  - PFTs (9/16) with FEV1 77%, FVC 76%, ratio 101%, TLC 63%, DLCO 41% => moderate restrictive deficit  9. Atrial fibrillation: Paroxysmal.  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  11. COVID-19 infection in 10/20.  12. OSA: CPAP.  SH: Prior smoker, quit 2/14.  Never drank ETOH.  No drugs. Lives with son.   FH: Mother with "heart trouble."   Review of systems complete and found to be negative unless listed in HPI.    Current Outpatient Medications  Medication Sig Dispense Refill  . albuterol (PROVENTIL HFA;VENTOLIN HFA) 108 (90 Base) MCG/ACT inhaler Inhale 2 puffs into the lungs every 6 (six) hours as needed for wheezing or shortness of breath. 18  g 0  . atorvastatin (LIPITOR) 40 MG tablet TAKE 1 TABLET(40 MG) BY MOUTH DAILY 30 tablet 11  . baclofen (LIORESAL) 10 MG tablet TK 1 T PO BID PRN    . carvedilol (COREG) 25 MG tablet TAKE 1 TABLET(25 MG) BY MOUTH TWICE DAILY WITH A MEAL 180 tablet 1  . ELIQUIS 5 MG TABS tablet TAKE 1 TABLET(5 MG) BY MOUTH TWICE DAILY 60 tablet 1  . erythromycin ophthalmic ointment Place a 1/2 inch ribbon of ointment into the lower eyelid up to 6 times a day for 3-5 days 3.5 g 0  . gabapentin (NEURONTIN) 100 MG capsule TK 1 C PO TID    . glucose blood (FREESTYLE TEST STRIPS) test strip Use as instructed 100 each 0  . glucose monitoring kit (FREESTYLE) monitoring kit 1 each by Does not apply route 4 (four) times daily - after meals and at bedtime. 1 month Diabetic Testing Supplies for QAC-QHS accuchecks. 1 each 1  . HUMALOG KWIKPEN 100 UNIT/ML KwikPen Sliding scale    . insulin glargine (LANTUS) 100 UNIT/ML Solostar Pen Inject 60 Units into the skin at bedtime.    . Insulin Pen Needle 32G X 8 MM MISC Use as directed 100 each 0  . isosorbide-hydrALAZINE (BIDIL) 20-37.5 MG tablet Take 2 tablets by mouth 3 (three) times daily. 180 tablet 6  . Lancets (FREESTYLE) lancets Use as instructed 100 each 0  . metFORMIN (GLUCOPHAGE) 1000 MG tablet Take 1,000 mg by mouth 2 (two) times daily with a meal.    . Oxycodone HCl 10 MG TABS Take 10 mg by mouth 3 (three) times daily as needed.    . potassium chloride SA (KLOR-CON) 20 MEQ tablet TAKE 3 TABLETS(60 MEQ) BY MOUTH DAILY 270 tablet 3  . sacubitril-valsartan (ENTRESTO) 97-103 MG Take 1 tablet by mouth 2 (two) times daily. 180 tablet 3  . spironolactone (ALDACTONE) 25 MG tablet TAKE 1 TABLET(25 MG) BY MOUTH DAILY 90 tablet 3  . Tafamidis (VYNDAMAX) 61 MG CAPS Take 61 mg by mouth daily. 90 capsule 3  . furosemide (LASIX) 40 MG tablet Take 1.5 tablets (60 mg total) by mouth in the morning AND 1 tablet (40 mg total) every evening. 75 tablet 3  . XIIDRA 5 % SOLN Place 1 drop into  both eyes 2 (two) times daily.     No current facility-administered medications for this encounter.   Vitals:   01/10/20 1426  BP: 112/70  Pulse: 84  SpO2: 99%  Weight: 109.2 kg (240 lb 12.8 oz)    Wt Readings from Last 3 Encounters:  01/10/20 109.2 kg (240 lb 12.8 oz)  01/02/20 107.5 kg (237 lb)  12/13/19 107.7 kg (237 lb 6.4 oz)    Physical exam General: NAD Neck: JVP 8-9 cm, no thyromegaly or thyroid nodule.  Lungs: Clear to auscultation bilaterally with normal respiratory effort. CV: Nondisplaced PMI.  Heart regular S1/S2, no S3/S4, no murmur.  1+ right ankle edema.  No carotid bruit.  Normal pedal pulses.  Abdomen: Soft, nontender, no hepatosplenomegaly, no distention.  Skin: Intact without lesions or rashes.  Neurologic: Alert and oriented x 3.  Psych: Normal affect. Extremities: No clubbing or cyanosis.  HEENT: Normal.   Assessment/Plan: 1. Chronic systolic CHF: Nonischemic cardiomyopathy, thought to be related to HTN. S/P CRT-D (Medtronic). Echo in 11/20 showed EF 40-45%. Mild volume overload on exam.  NYHA class II symptoms. - Continue Coreg 25 mg BID.  - Continue Entresto 97/103 mg BID - Continue Bidil 2 tabs TID - Continue spiro 25 mg daily.  - Increase Lasix to 60 qam/40 qpm.  BMET today and again in 10 days.  - Check hgbA1c.  If not markedly high, will add Farxiga to regimen next appointment.  - Repeat echo at followup appt.  2. Hyperlipidemia: Check lipids next appt.  3. HTN: BP now controlled.  4. PAF: No prolonged episodes.    - Continue Eliquis for anticoagulation.  5. 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 Genetic testing was negative (wild type).  - Continue tafamidis.  6. OSA: Strongly suspect OSA, was diagnosed in the remote past but does not have CPAP.  - I will arrange for sleep study.   Followup in 6 wks with APP   [name], MD  01/11/2020

## 2020-01-12 ENCOUNTER — Other Ambulatory Visit (HOSPITAL_COMMUNITY): Payer: Self-pay

## 2020-01-12 ENCOUNTER — Other Ambulatory Visit: Payer: Self-pay

## 2020-01-12 ENCOUNTER — Ambulatory Visit (INDEPENDENT_AMBULATORY_CARE_PROVIDER_SITE_OTHER): Payer: Medicare Other | Admitting: Ophthalmology

## 2020-01-12 ENCOUNTER — Encounter (INDEPENDENT_AMBULATORY_CARE_PROVIDER_SITE_OTHER): Payer: Self-pay | Admitting: Ophthalmology

## 2020-01-12 DIAGNOSIS — H3521 Other non-diabetic proliferative retinopathy, right eye: Secondary | ICD-10-CM | POA: Diagnosis not present

## 2020-01-12 DIAGNOSIS — H3581 Retinal edema: Secondary | ICD-10-CM

## 2020-01-12 DIAGNOSIS — Z961 Presence of intraocular lens: Secondary | ICD-10-CM

## 2020-01-12 DIAGNOSIS — H35371 Puckering of macula, right eye: Secondary | ICD-10-CM | POA: Diagnosis not present

## 2020-01-12 DIAGNOSIS — I10 Essential (primary) hypertension: Secondary | ICD-10-CM

## 2020-01-12 DIAGNOSIS — H34231 Retinal artery branch occlusion, right eye: Secondary | ICD-10-CM

## 2020-01-12 DIAGNOSIS — E113393 Type 2 diabetes mellitus with moderate nonproliferative diabetic retinopathy without macular edema, bilateral: Secondary | ICD-10-CM

## 2020-01-12 DIAGNOSIS — H35033 Hypertensive retinopathy, bilateral: Secondary | ICD-10-CM

## 2020-01-12 NOTE — Progress Notes (Signed)
Paramedicine Encounter    Patient ID: [name], female    DOB: [birth year], 69 y.o.   MRN: [id number]   Patient Care Team: Donald Prose, MD as PCP - General (Family Medicine) Jorge Ny, LCSW as Social Worker (Licensed Clinical Social Worker)  Patient Active Problem List   Diagnosis Date Noted   Right foot infection 10/22/2017   Cellulitis in diabetic foot ([place]) 10/22/2017   Hyperglycemia 10/22/2017   Lumbar radiculopathy 09/18/2017   Degenerative spondylolisthesis 09/18/2017   Atrial fibrillation ([place]) 02/11/2017   Paroxysmal A-fib ([facility])    Biventricular automatic implantable cardioverter defibrillator in situ    Sepsis ([place]) 04/20/2016   UTI (urinary tract infection) 04/20/2016   Dyspnea 07/19/2015   Interstitial lung disease ([place]) 11/20/2014   SIRS (systemic inflammatory response syndrome) ([place]) 02/03/2014   IDDM (insulin dependent diabetes mellitus) 02/03/2014   Tachycardia 63/78/5885   Chronic systolic CHF (congestive heart failure) ([place]) 09/29/2012   Hypoxia 03/06/2012   Hypertensive heart disease 03/06/2012   Nonischemic cardiomyopathy ([place]) 03/06/2012   Tobacco abuse 03/06/2012   Type 2 diabetes mellitus ([place]) 03/06/2012   Hypertension 03/06/2012   CAD (coronary artery disease), native coronary artery 03/06/2012   Hypokalemia 03/06/2012   Acute bronchitis 02/01/2009   Sleep apnea 02/01/2009   Chest pain 02/01/2009    Current Outpatient Medications:    albuterol (PROVENTIL HFA;VENTOLIN HFA) 108 (90 Base) MCG/ACT inhaler, Inhale 2 puffs into the lungs every 6 (six) hours as needed for wheezing or shortness of breath., Disp: 18 g, Rfl: 0   atorvastatin (LIPITOR) 40 MG tablet, TAKE 1 TABLET(40 MG) BY MOUTH DAILY, Disp: 30 tablet, Rfl: 11   baclofen (LIORESAL) 10 MG tablet, TK 1 T PO BID PRN, Disp: , Rfl:    carvedilol (COREG) 25 MG tablet, TAKE 1 TABLET(25 MG) BY MOUTH TWICE DAILY WITH A MEAL, Disp: 180 tablet, Rfl: 1   ELIQUIS 5  MG TABS tablet, TAKE 1 TABLET(5 MG) BY MOUTH TWICE DAILY, Disp: 60 tablet, Rfl: 1   erythromycin ophthalmic ointment, Place a 1/2 inch ribbon of ointment into the lower eyelid up to 6 times a day for 3-5 days, Disp: 3.5 g, Rfl: 0   furosemide (LASIX) 40 MG tablet, Take 1.5 tablets (60 mg total) by mouth in the morning AND 1 tablet (40 mg total) every evening., Disp: 75 tablet, Rfl: 3   gabapentin (NEURONTIN) 100 MG capsule, TK 1 C PO TID, Disp: , Rfl:    glucose blood (FREESTYLE TEST STRIPS) test strip, Use as instructed, Disp: 100 each, Rfl: 0   glucose monitoring kit (FREESTYLE) monitoring kit, 1 each by Does not apply route 4 (four) times daily - after meals and at bedtime. 1 month Diabetic Testing Supplies for QAC-QHS accuchecks., Disp: 1 each, Rfl: 1   HUMALOG KWIKPEN 100 UNIT/ML KwikPen, Sliding scale, Disp: , Rfl:    insulin glargine (LANTUS) 100 UNIT/ML Solostar Pen, Inject 60 Units into the skin at bedtime., Disp: , Rfl:    Insulin Pen Needle 32G X 8 MM MISC, Use as directed, Disp: 100 each, Rfl: 0   isosorbide-hydrALAZINE (BIDIL) 20-37.5 MG tablet, Take 2 tablets by mouth 3 (three) times daily., Disp: 180 tablet, Rfl: 6   Lancets (FREESTYLE) lancets, Use as instructed, Disp: 100 each, Rfl: 0   metFORMIN (GLUCOPHAGE) 1000 MG tablet, Take 1,000 mg by mouth 2 (two) times daily with a meal., Disp: , Rfl:    Oxycodone HCl 10 MG TABS, Take 10 mg by mouth 3 (three) times  daily as needed., Disp: , Rfl:    potassium chloride SA (KLOR-CON) 20 MEQ tablet, TAKE 3 TABLETS(60 MEQ) BY MOUTH DAILY, Disp: 270 tablet, Rfl: 3   sacubitril-valsartan (ENTRESTO) 97-103 MG, Take 1 tablet by mouth 2 (two) times daily., Disp: 180 tablet, Rfl: 3   spironolactone (ALDACTONE) 25 MG tablet, TAKE 1 TABLET(25 MG) BY MOUTH DAILY, Disp: 90 tablet, Rfl: 3   Tafamidis (VYNDAMAX) 61 MG CAPS, Take 61 mg by mouth daily., Disp: 90 capsule, Rfl: 3   XIIDRA 5 % SOLN, Place 1 drop into both eyes 2 (two) times  daily., Disp: , Rfl:  Allergies  Allergen Reactions   Morphine And Related Nausea Only    Severe nausea   Penicillins Other (See Comments)    Pt states she has had a pain in her leg since a penicillin injection 2 months ago (reported 08/31/17).  Has tolerated amoxicillin oral.  Has patient had a PCN reaction causing immediate rash, facial/tongue/throat swelling, SOB or lightheadedness with hypotension: No Has patient had a PCN reaction causing severe rash involving mucus membranes or skin necrosis: No Has patient had a PCN reaction that required hospitalization: No Has patient had a PCN reaction occurring within the last 10 years: Yes If a      Social History   Socioeconomic History   Marital status: Single    Spouse name: Not on file   Number of children: 4   Years of education: 11   Highest education level: Not on file  Occupational History   Not on file  Tobacco Use   Smoking status: Former Smoker    Packs/day: 1.00    Years: 40.00    Pack years: 40.00    Types: Cigarettes    Start date: 06/23/1972    Quit date: 03/26/2012    Years since quitting: 7.8   Smokeless tobacco: Never Used  Vaping Use   Vaping Use: Never used  Substance and Sexual Activity   Alcohol use: No   Drug use: No   Sexual activity: Yes  Other Topics Concern   Not on file  Social History Narrative   Not on file   Social Determinants of Health   Financial Resource Strain: Not on file  Food Insecurity: Not on file  Transportation Needs: Not on file  Physical Activity: Not on file  Stress: Not on file  Social Connections: Not on file  Intimate Partner Violence: Not on file    Physical Exam Cardiovascular:     Rate and Rhythm: Normal rate and regular rhythm.     Pulses: Normal pulses.  Pulmonary:     Effort: Pulmonary effort is normal.     Breath sounds: Normal breath sounds.  Musculoskeletal:        General: Normal range of motion.     Right lower leg: Edema present.      Left lower leg: Edema present.  Skin:    General: Skin is warm and dry.     Capillary Refill: Capillary refill takes less than 2 seconds.  Neurological:     Mental Status: She is alert and oriented to person, place, and time.  Psychiatric:        Mood and Affect: Mood normal.         Future Appointments  Date Time Provider [place]  01/24/2020  9:30 AM [facility] LAB [facility] None  03/15/2020  8:00 AM MC ECHO OP 1 [facility]  03/15/2020  9:00 AM Larey Dresser, MD [facility] None  01/11/2021  9:15 AM Bernarda Caffey, MD [facility] None    BP 117/60 (BP Location: Left Arm, Patient Position: Supine, Cuff Size: Normal)    Pulse 81    Resp 16    Wt 239 lb (108.4 kg)    SpO2 95%    BMI 37.43 kg/m   Weight yesterday-did not weigh  Last visit weight- 240 lb  Ms Vanacker was seen at home today and reported feeling generally well. She denied chest pain, SOB, headache, dizziness, orthopnea, fever or cough. She stated she has been compliant with her medications and her weight has been stable. Her medications were verified and her pillbox was refilled. I will follow up tomorrow to deliver Bidil and potassium in order to finish filling her pillboxes.   [name], EMT 01/12/20  ACTION: Home visit completed Next visit planned for tomorrow

## 2020-01-13 ENCOUNTER — Other Ambulatory Visit (HOSPITAL_COMMUNITY): Payer: Self-pay

## 2020-01-13 NOTE — Progress Notes (Signed)
Ms Odom was seen at home this morning and reported feeling generally well. The purpose of this visit was to deliver medications and finish filling her pillbox. Potassium and Bidil were added to her box and nothing further was needed. I will follow up in two weeks.   [name], EMT 01/13/20

## 2020-01-23 ENCOUNTER — Other Ambulatory Visit (HOSPITAL_COMMUNITY): Payer: Medicare Other

## 2020-01-23 MED FILL — VYNDAMAX 61 MG CAPS: 61 | 30 days supply | Qty: 30 | Fill #3

## 2020-01-24 ENCOUNTER — Other Ambulatory Visit: Payer: Self-pay

## 2020-01-24 ENCOUNTER — Ambulatory Visit (HOSPITAL_COMMUNITY)
Admission: RE | Admit: 2020-01-24 | Discharge: 2020-01-24 | Disposition: A | Discharge status: Home / Self Care | Payer: Medicare Other | Source: Ambulatory Visit | Attending: Cardiology | Admitting: Cardiology

## 2020-01-24 DIAGNOSIS — I5022 Chronic systolic (congestive) heart failure: Secondary | ICD-10-CM | POA: Insufficient documentation

## 2020-01-24 LAB — BASIC METABOLIC PANEL
Anion gap: 10 (ref 5–15)
BUN: 16 mg/dL (ref 8–23)
CO2: 26 mmol/L (ref 22–32)
Calcium: 8.8 mg/dL — ABNORMAL LOW (ref 8.9–10.3)
Chloride: 102 mmol/L (ref 98–111)
Creatinine, Ser: 1.05 mg/dL — ABNORMAL HIGH (ref 0.44–1.00)
GFR, Estimated: 58 mL/min — ABNORMAL LOW (ref >60)
Glucose, Bld: 276 mg/dL — ABNORMAL HIGH (ref 70–99)
Potassium: 3.6 mmol/L (ref 3.5–5.1)
Sodium: 138 mmol/L (ref 135–145)

## 2020-01-25 ENCOUNTER — Telehealth (HOSPITAL_COMMUNITY): Payer: Self-pay | Admitting: Pharmacist

## 2020-01-25 NOTE — Telephone Encounter (Signed)
Advanced Heart Failure Patient Advocate Encounter  Prior Authorization for [name] has been approved.    PA# [id number] Effective dates: 01/28/20 through 01/26/21  Karle Plumber, PharmD, BCPS, BCCP, CPP Heart Failure Clinic Pharmacist [phone]'

## 2020-01-25 NOTE — Telephone Encounter (Signed)
Patient Advocate Encounter   Received notification from [employer] that prior authorization for Vyndamax is required.   PA submitted on CoverMyMeds Key [id number] Status is pending   Will continue to follow.   Karle Plumber, PharmD, BCPS, BCCP, CPP Heart Failure Clinic Pharmacist [phone]

## 2020-01-26 ENCOUNTER — Telehealth (HOSPITAL_COMMUNITY): Payer: Self-pay

## 2020-01-26 NOTE — Telephone Encounter (Signed)
I called [name] to schedule an appointment. She stated she has an appointment with her PCP at 09:00 in the morning but would be able to meet with me afterwards. I advised I would see her around 11:30 and she was agreeable.  Jacqualine Code, EMT 01/26/20

## 2020-01-26 NOTE — Telephone Encounter (Signed)
I called Ms Nowakowski to schedule an appointment. She did not answer so I left a message requesting she call me to schedule something as soon as possible.   Jacqualine Code, EMT 01/26/20

## 2020-01-27 ENCOUNTER — Other Ambulatory Visit (HOSPITAL_COMMUNITY): Payer: Self-pay

## 2020-01-27 NOTE — Progress Notes (Signed)
[name] was seen at home today and reported feeling well. She denied chest pain, SOB, headache, dizziness, orthopnea, fever or cough. She stated she has been compliant with her medications over the past week and her weight has been stable. She was seen at her PCP today so the purpose of my visit was to refill her pillboxes. I will follow up in two weeks.   [name], EMT 01/27/20

## 2020-02-05 ENCOUNTER — Emergency Department (HOSPITAL_COMMUNITY): Payer: Medicare Other

## 2020-02-05 ENCOUNTER — Encounter (HOSPITAL_COMMUNITY): Payer: Self-pay

## 2020-02-05 ENCOUNTER — Other Ambulatory Visit: Payer: Self-pay

## 2020-02-05 ENCOUNTER — Emergency Department (HOSPITAL_COMMUNITY)
Admission: EM | Admit: 2020-02-05 | Discharge: 2020-02-05 | Disposition: A | Discharge status: Home / Self Care | Payer: Medicare Other | Attending: Emergency Medicine | Admitting: Emergency Medicine

## 2020-02-05 DIAGNOSIS — I2510 Atherosclerotic heart disease of native coronary artery without angina pectoris: Secondary | ICD-10-CM | POA: Insufficient documentation

## 2020-02-05 DIAGNOSIS — Z7901 Long term (current) use of anticoagulants: Secondary | ICD-10-CM | POA: Insufficient documentation

## 2020-02-05 DIAGNOSIS — Z794 Long term (current) use of insulin: Secondary | ICD-10-CM | POA: Insufficient documentation

## 2020-02-05 DIAGNOSIS — Z79899 Other long term (current) drug therapy: Secondary | ICD-10-CM | POA: Insufficient documentation

## 2020-02-05 DIAGNOSIS — Z95810 Presence of automatic (implantable) cardiac defibrillator: Secondary | ICD-10-CM | POA: Diagnosis not present

## 2020-02-05 DIAGNOSIS — Z9861 Coronary angioplasty status: Secondary | ICD-10-CM | POA: Diagnosis not present

## 2020-02-05 DIAGNOSIS — Z7984 Long term (current) use of oral hypoglycemic drugs: Secondary | ICD-10-CM | POA: Diagnosis not present

## 2020-02-05 DIAGNOSIS — R059 Cough, unspecified: Secondary | ICD-10-CM | POA: Diagnosis present

## 2020-02-05 DIAGNOSIS — I110 Hypertensive heart disease with heart failure: Secondary | ICD-10-CM | POA: Insufficient documentation

## 2020-02-05 DIAGNOSIS — J069 Acute upper respiratory infection, unspecified: Secondary | ICD-10-CM

## 2020-02-05 DIAGNOSIS — U071 COVID-19: Secondary | ICD-10-CM | POA: Diagnosis not present

## 2020-02-05 DIAGNOSIS — Z96643 Presence of artificial hip joint, bilateral: Secondary | ICD-10-CM | POA: Diagnosis not present

## 2020-02-05 DIAGNOSIS — I5042 Chronic combined systolic (congestive) and diastolic (congestive) heart failure: Secondary | ICD-10-CM | POA: Insufficient documentation

## 2020-02-05 DIAGNOSIS — Z87891 Personal history of nicotine dependence: Secondary | ICD-10-CM | POA: Insufficient documentation

## 2020-02-05 DIAGNOSIS — Z20822 Contact with and (suspected) exposure to covid-19: Secondary | ICD-10-CM

## 2020-02-05 DIAGNOSIS — E119 Type 2 diabetes mellitus without complications: Secondary | ICD-10-CM | POA: Insufficient documentation

## 2020-02-05 LAB — RESP PANEL BY RT-PCR (FLU A&B, COVID) ARPGX2
Influenza A by PCR: NEGATIVE
Influenza B by PCR: NEGATIVE
SARS Coronavirus 2 by RT PCR: POSITIVE — AB

## 2020-02-05 MED ORDER — BENZONATATE 100 MG PO CAPS: 200.0000 mg | ORAL_CAPSULE | Freq: Every evening | ORAL | 0 refills | Status: DC

## 2020-02-05 MED ORDER — SACUBITRIL-VALSARTAN 97-103 MG PO TABS: 2.0000 | ORAL_TABLET | Freq: Two times a day (BID) | ORAL | Status: DC

## 2020-02-05 MED ORDER — ALBUTEROL SULFATE HFA 108 (90 BASE) MCG/ACT IN AERS: 2.0000 | INHALATION_SPRAY | RESPIRATORY_TRACT | 0 refills | Status: DC | PRN

## 2020-02-05 MED ORDER — ISOSORB DINITRATE-HYDRALAZINE 20-37.5 MG PO TABS
2.0000 | ORAL_TABLET | Freq: Three times a day (TID) | ORAL | Status: DC
  Administered 2020-02-05: 2 via ORAL
  Filled 2020-02-05: qty 2

## 2020-02-05 MED ORDER — CARVEDILOL 12.5 MG PO TABS
25.0000 mg | ORAL_TABLET | Freq: Once | ORAL | Status: AC
  Administered 2020-02-05: 25 mg via ORAL
  Filled 2020-02-05: qty 2

## 2020-02-05 MED ORDER — SACUBITRIL-VALSARTAN 97-103 MG PO TABS
1.0000 | ORAL_TABLET | Freq: Two times a day (BID) | ORAL | Status: DC
  Administered 2020-02-05: 1 via ORAL
  Filled 2020-02-05: qty 1

## 2020-02-05 NOTE — Discharge Instructions (Addendum)
You were seen in the ED for cough  Chest x-ray was normal  I suspect you have a virus.  We tested your for COVID-19 (coronavirus) infection and influenza A and B.  It is possible you could have other viral upper respiratory infection from another virus like influenza, rhinovirus, etc.   COVID and influenza testing will take 1-2 hours.  Someone will call you to notify you of results tonight or at the latest tomorrow morning.  You can also check MyChart for formal results that will be posted.  See instruction on how to make a Mychart account below   Treatment of viral illness and symptoms for now will include self-isolation, monitoring of symptoms and supportive care with over-the-counter medicines.    Stay well-hydrated. Rest. You can use over the counter medications to help with symptoms: [phone] mg acetaminophen (tylenol) every 6 hours, around the clock to help with associated fevers, sore throat, headaches, generalized body aches and malaise.  Oxymetazoline (afrin) intranasal spray once daily for no more than 3 days to help with congestion, after 3 days you can switch to another over-the-counter nasal steroid spray such as fluticasone (flonase) Allergy medication (loratadine, cetirizine, etc) and phenylephrine (sudafed) help with nasal congestion, runny nose and postnasal drip.   Guaifenesin (Mucinex) to help expectorate mucus and cough Wash your hands often to prevent spread.  Stay hydrated with plenty of clear fluids Rest   I have prescribed you benzonatate (tessalon perles) 200 mg to take an night to suppress disruptive cough.  Use albuterol inhaler 2 puffs every 4 hours to help open up your airways  Return to the ED if symptoms are worsening or severe, there is increased work of breathing, chest pain or shortness of breath with exertion or activity, inability to tolerate fluids due to persistent vomiting despite nausea medicines, passing out, light headedness.  IF YOU TEST POSITIVE FOR  COVID   How to calculate days of illness: Day 0 is your first day you developed symptoms OR if you did not have symptoms, Day 0 is the day you had a positive viral test. Day 1 is the first full day after your symptoms developed or your test specimen was collected. If you have COVID-19 or have symptoms, isolate for at least 5 days.  If you tested positive for COVID-19 and/or have symptoms, regardless of vaccination status please follow the following steps:  QUARANTINE Stay home for 5 days and isolate from others in your home. Wear a well-fitted mask if you must be around others in your home.  2. ENDING ISOLATION If you had mild/moderate symptoms: end isolation after 5 full days IF you are fever-free for 24 hours (without the use of fever-reducing medication) and your symptoms are improving.  A fever is 100.4 F or greater If you did not have symptoms: end isolation after at least 5 full days after your positive test. If you severe symptoms or critically ill (hospitalized): end isolation after at least 10 days. Consult your doctor before ending isolation.  3. PRECAUTIONS: Take precautions until day 10 Wear a mask: wear a well-fitted mask for 10 full days any time you are around others inside your home or in public. Do not go to places where you are unable to wear a mask. Avoid travel Avoid being around people who are at high risk

## 2020-02-05 NOTE — ED Provider Notes (Addendum)
[facility] DEPT Provider Note   CSN: [id number] Arrival date & time: 02/05/20  1037     History Chief Complaint  Patient presents with  . Cough  . chest congestion  . Nasal Congestion    [name] is a 70 y.o. female presents to the ED for evaluation of cough.  Onset 2 to 3 days ago.  Coughing up a little bit of phlegm.  Associated with runny nose.  Vaccinated for COVID with Pfister vaccine x2 in March 2021, no booster.  No sick contacts.  Denies fever, sore throat, chest pain, shortness of breath, nausea, vomiting, diarrhea, abdominal pain, changes in taste or smell.  Is eating well.  No tobacco use.  Has tried cough syrup, halls but not helping.  Denies new leg swelling.  Patient blood pressure noted to be 207/93 in triage.  Patient states she did not take her blood pressure medicines today.  States her blood pressure is always "high" even if she takes her medicines or not.  States he has never been in the 200 range.  Denies severe headache, visual changes, significant abdominal or back pain, extremity tingling or numbness, leg swelling.  Reports chronic orthopnea that is unchanged. On eliquis  HPI     Past Medical History:  Diagnosis Date  . Anemia    a. Noted on 07/2012 labs, instructed to f/u PCP.  Marland Kitchen Arthritis    "joints" (11/18/2012)  . CAD (coronary artery disease), native coronary artery    a. Nonobstructive by cath 02/2012 (done because of low EF).  . Chronic bronchitis ([place])    "~ every other year" (11/18/2012)  . Chronic combined systolic and diastolic CHF (congestive heart failure) ([place])    a. 03/05/12 echo:  LVEF 20-25%, moderate LVH , inferior and basal to mid septal akinesis, anterior moderate to severe hypokinesis and grade 2 diastolic dysfunction. b. EF 07/2012: EF still 25% (unclear medication compliance).  . Chronic lower back pain   . Headache(784.0)    "often; maybe not daily" (11/18/2012)  . High cholesterol   . History of  noncompliance with medical treatment   . Hypertension   . LBBB (left bundle branch block)   . Orthopnea   . Tobacco abuse   . Type II diabetes mellitus [facility])     Patient Active Problem List   Diagnosis Date Noted  . Right foot infection 10/22/2017  . Cellulitis in diabetic foot ([place]) 10/22/2017  . Hyperglycemia 10/22/2017  . Lumbar radiculopathy 09/18/2017  . Degenerative spondylolisthesis 09/18/2017  . Atrial fibrillation ([place]) 02/11/2017  . Paroxysmal A-fib ([place])   . Biventricular automatic implantable cardioverter defibrillator in situ   . Sepsis ([place]) 04/20/2016  . UTI (urinary tract infection) 04/20/2016  . Dyspnea 07/19/2015  . Interstitial lung disease ([place]) 11/20/2014  . SIRS (systemic inflammatory response syndrome) ([place]) 02/03/2014  . IDDM (insulin dependent diabetes mellitus) 02/03/2014  . Tachycardia 06/14/2013  . Chronic systolic CHF (congestive heart failure) ([place]) 09/29/2012  . Hypoxia 03/06/2012  . Hypertensive heart disease 03/06/2012  . Nonischemic cardiomyopathy ([place]) 03/06/2012  . Tobacco abuse 03/06/2012  . Type 2 diabetes mellitus ([place]) 03/06/2012  . Hypertension 03/06/2012  . CAD (coronary artery disease), native coronary artery 03/06/2012  . Hypokalemia 03/06/2012  . Acute bronchitis 02/01/2009  . Sleep apnea 02/01/2009  . Chest pain 02/01/2009    Past Surgical History:  Procedure Laterality Date  . BI-VENTRICULAR IMPLANTABLE CARDIOVERTER DEFIBRILLATOR N/A 11/18/2012   Procedure: BI-VENTRICULAR IMPLANTABLE CARDIOVERTER DEFIBRILLATOR  (CRT-D);  Surgeon: Evans Lance, MD;  Location: [facility] CATH LAB;  Service: Cardiovascular;  Laterality: N/A;  . BI-VENTRICULAR IMPLANTABLE CARDIOVERTER DEFIBRILLATOR  (CRT-D)  11/18/2012  . CARDIAC CATHETERIZATION  03/04/12   nonobstructive CAD, elevated LVEDP and tortuous vessels suggestive of long-standing hypertension  . COLONOSCOPY WITH PROPOFOL N/A 12/12/2016   Procedure: COLONOSCOPY WITH PROPOFOL;  Surgeon: Carol Ada, MD;  Location: [facility] ENDOSCOPY;  Service: Endoscopy;  Laterality: N/A;  . JOINT REPLACEMENT     Bilateral hip and right knee  . LEFT HEART CATH N/A 03/05/2012   Procedure: LEFT HEART CATH;  Surgeon: Larey Dresser, MD;  Location[facility] CATH LAB;  Service: Cardiovascular;  Laterality: N/A;     OB History   No obstetric history on file.     Family History  Problem Relation Age of Onset  . Heart failure Mother   . Heart disease Neg Hx     Social History   Tobacco Use  . Smoking status: Former Smoker    Packs/day: 1.00    Years: 40.00    Pack years: 40.00    Types: Cigarettes    Start date: 06/23/1972    Quit date: 03/26/2012    Years since quitting: 7.8  . Smokeless tobacco: Never Used  Vaping Use  . Vaping Use: Never used  Substance Use Topics  . Alcohol use: No  . Drug use: No    Home Medications Prior to Admission medications   Medication Sig Start Date End Date Taking? Authorizing Provider  albuterol (VENTOLIN HFA) 108 (90 Base) MCG/ACT inhaler Inhale 2 puffs into the lungs every 4 (four) hours as needed for wheezing or shortness of breath (COUGH). 02/05/20  Yes Carmon Sails J, PA-C  atorvastatin (LIPITOR) 40 MG tablet TAKE 1 TABLET(40 MG) BY MOUTH DAILY 04/26/19   Larey Dresser, MD  baclofen (LIORESAL) 10 MG tablet TK 1 T PO BID PRN 02/08/18   [provider]  benzonatate (TESSALON PERLES) 100 MG capsule Take 2 capsules (200 mg total) by mouth at bedtime. FOR DISRUPTIVE NIGHT TIME COUGH 02/05/20   Kinnie Feil, PA-C  carvedilol (COREG) 25 MG tablet TAKE 1 TABLET(25 MG) BY MOUTH TWICE DAILY WITH A MEAL 10/28/19   Larey Dresser, MD  ELIQUIS 5 MG TABS tablet TAKE 1 TABLET(5 MG) BY MOUTH TWICE DAILY 08/16/19   Larey Dresser, MD  erythromycin ophthalmic ointment Place a 1/2 inch ribbon of ointment into the lower eyelid up to 6 times a day for 3-5 days 01/02/20   Alfredia Client, PA-C  furosemide (LASIX) 40 MG tablet Take 1.5 tablets (60 mg total) by mouth  in the morning AND 1 tablet (40 mg total) every evening. 01/10/20   Larey Dresser, MD  gabapentin (NEURONTIN) 100 MG capsule TK 1 C PO TID 03/10/18   [provider]  glucose blood (FREESTYLE TEST STRIPS) test strip Use as instructed 04/25/16   Ghimire, Henreitta Leber, MD  glucose monitoring kit (FREESTYLE) monitoring kit 1 each by Does not apply route 4 (four) times daily - after meals and at bedtime. 1 month Diabetic Testing Supplies for QAC-QHS accuchecks. 04/25/16   Ghimire, Henreitta Leber, MD  HUMALOG KWIKPEN 100 UNIT/ML KwikPen Sliding scale 02/12/18   [provider]  insulin glargine (LANTUS) 100 UNIT/ML Solostar Pen Inject 60 Units into the skin at bedtime.    [provider]  Insulin Pen Needle 32G X 8 MM MISC Use as directed 06/18/16   Arbutus Leas, NP  isosorbide-hydrALAZINE (  BIDIL) 20-37.5 MG tablet Take 2 tablets by mouth 3 (three) times daily. 07/20/19   Larey Dresser, MD  Lancets (FREESTYLE) lancets Use as instructed 06/18/16   Arbutus Leas, NP  metFORMIN (GLUCOPHAGE) 1000 MG tablet Take 1,000 mg by mouth 2 (two) times daily with a meal.    [provider]  Oxycodone HCl 10 MG TABS Take 10 mg by mouth 3 (three) times daily as needed. Patient not taking: Reported on 01/12/2020 01/09/20   [provider]  potassium chloride SA (KLOR-CON) 20 MEQ tablet TAKE 3 TABLETS(60 MEQ) BY MOUTH DAILY 11/09/19   Larey Dresser, MD  sacubitril-valsartan (ENTRESTO) 97-103 MG Take 1 tablet by mouth 2 (two) times daily. 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 09/30/19   Larey Dresser, MD  XIIDRA 5 % SOLN Place 1 drop into both eyes 2 (two) times daily. 08/17/19   [provider]    Allergies    Morphine and related and Penicillins  Review of Systems   Review of Systems  HENT: Positive for  rhinorrhea.   Respiratory: Positive for cough.   Hematological: Bruises/bleeds easily.  All other systems reviewed and are negative.   Physical Exam Updated Vital Signs BP (!) 156/118 (BP Location: Left Arm)   Pulse 84   Temp 98 F (36.7 C) (Oral)   Resp 19   Ht '5\' 7"'  (1.702 m)   Wt 108.4 kg   SpO2 99%   BMI 37.43 kg/m   Physical Exam Vitals and nursing note reviewed.  Constitutional:      General: She is not in acute distress.    Appearance: She is well-developed and well-nourished.     Comments: NAD.  HENT:     Head: Normocephalic and atraumatic.     Right Ear: External ear normal.     Left Ear: External ear normal.     Nose: Nose normal.  Eyes:     General: No scleral icterus.    Extraocular Movements: EOM normal.     Conjunctiva/sclera: Conjunctivae normal.  Cardiovascular:     Rate and Rhythm: Normal rate and regular rhythm.     Heart sounds: Normal heart sounds. No murmur heard.     Comments: No lower extremity edema.  No calf tenderness. Pulmonary:     Effort: Pulmonary effort is normal.     Breath sounds: Normal breath sounds. No wheezing.  Musculoskeletal:        General: No deformity. Normal range of motion.     Cervical back: Normal range of motion and neck supple.  Skin:    General: Skin is warm and dry.     Capillary Refill: Capillary refill takes less than 2 seconds.  Neurological:     Mental Status: She is alert and oriented to person, place, and time.  Psychiatric:        Mood and Affect: Mood and affect normal.        Behavior: Behavior normal.        Thought Content: Thought content normal.        Judgment: Judgment normal.     ED Results / Procedures / Treatments   Labs (all labs ordered are listed, but only abnormal results are displayed) Labs Reviewed  RESP PANEL BY RT-PCR (FLU A&B, COVID) [id number]  EKG None  Radiology DG Chest 1 View  Result Date: 02/05/2020 CLINICAL DATA:  70 year old female with cough. EXAM: CHEST  1 VIEW  COMPARISON:  Chest radiograph dated 08/03/2019. FINDINGS: No focal consolidation, pleural effusion, or pneumothorax with mild cardiomegaly. Left pectoral AICD device. No acute osseous pathology. IMPRESSION: No acute cardiopulmonary process. Electronically Signed   By: Anner Crete M.D.   On: 02/05/2020 20:33    Procedures Procedures (including critical care time)  Medications Ordered in ED Medications  isosorbide-hydrALAZINE (BIDIL) 20-37.5 MG per tablet 2 tablet (2 tablets Oral Given 02/05/20 2138)  sacubitril-valsartan (ENTRESTO) 97-103 mg per tablet (1 tablet Oral Given 02/05/20 2138)  carvedilol (COREG) tablet 25 mg (25 mg Oral Given 02/05/20 2138)    ED Course  I have reviewed the triage vital signs and the nursing notes.  Pertinent labs & imaging results that were available during my care of the patient were reviewed by me and considered in my medical decision making (see chart for details).    MDM Rules/Calculators/A&P                          70 year old female presents to the ED for 2 to 3 days of cough, runny nose.  Vaccinated for COVID with [employer] vaccine x2 in March 2021.  Denies red flags like fever, chest pain, shortness of breath.  Chest x-ray ordered and personally visualized and interpreted-this is unremarkable.  No edema, opacities, infiltrates.  Exam is benign.  Normal lung sounds.  Hypertensive in the ED in the 169I systolic.  Patient did not take her blood pressure medicines.  States her blood pressure is usually high.  She cannot tell me the usual numbers however.  Denies any symptoms of hypertensive emergency.  At this time I do not think emergent laboratory/imaging is necessary for isolated and asymptomatic elevated blood pressure reading.  We will give her home oral hypertensive medicines.  Respiratory panel is pending at discharge.  Patient instructed to isolate/quarantine until she obtains results.  CDC isolation guidelines discussed with patient.  Discussed OTC  management of viral illness.  Will give cough syrup prescription.  Given her BMI and other comorbidities patient may benefit from antibody infusion.  This was briefly discussed with her.  I will message infusion team so they can follow-up with her and determine if she meets criteria and arrange an appointment for an infusion.  Return precautions discussed. BP improved after oral antihypertensives.   Final Clinical Impression(s) / ED Diagnoses Final diagnoses:  Viral URI with cough  Encounter for laboratory testing for COVID-19 virus    Rx / DC Orders ED Discharge Orders         Ordered    benzonatate (TESSALON PERLES) 100 MG capsule  Nightly,   Status:  Discontinued        02/05/20 2113    albuterol (VENTOLIN HFA) 108 (90 Base) MCG/ACT inhaler  Every 4 hours PRN        02/05/20 2113    benzonatate (TESSALON PERLES) 100 MG capsule  Nightly        02/05/20 2114            Kinnie Feil, PA-C 02/05/20 2150    Carmin Muskrat, MD 02/05/20 2242

## 2020-02-05 NOTE — ED Triage Notes (Signed)
Patient c/o cough,chest and nasal congestion x 2 days.

## 2020-02-06 ENCOUNTER — Telehealth (HOSPITAL_COMMUNITY): Payer: Self-pay | Admitting: Emergency Medicine

## 2020-02-06 ENCOUNTER — Telehealth: Payer: Self-pay | Admitting: Unknown Physician Specialty

## 2020-02-06 MED ORDER — ALBUTEROL SULFATE HFA 108 (90 BASE) MCG/ACT IN AERS: 2.0000 | INHALATION_SPRAY | Freq: Four times a day (QID) | RESPIRATORY_TRACT | 0 refills | Status: DC | PRN

## 2020-02-06 NOTE — Telephone Encounter (Signed)
Called to discuss with patient about COVID-19 symptoms and the use of one of the available treatments for those with mild to moderate Covid symptoms and at a high risk of hospitalization.  Pt appears to qualify for outpatient treatment due to co-morbid conditions and/or a member of an at-risk group in accordance with the FDA Emergency Use Authorization.    Symptom onset: 1/6 or 1/7 Vaccinated: yes  Booster? No Qualifiers: Age, BP  Unable to reach pt - [place]

## 2020-02-06 NOTE — Telephone Encounter (Signed)
02/06/20 1045: called patient and spoke to her about positive COVID test result in ER last night. Discussed symptomatic management including albuterol 2 puffs q4 hours, mucinex during the day, tessalon perles at night.  Discussed symptoms that would warrant ER evaluation. Explained she may qualify for antibody or remdesivir infusions. I will message infusion team to notify them so they can determine if she meets criteria. She was told the infusion clinic will contact her if she meets criteria. Patient asked to change ventalin to proair, rx changed. Patient appreciated call and understood instructions.

## 2020-02-06 NOTE — Telephone Encounter (Signed)
I connected by phone with [name] on 02/06/2020 at 4:47 PM to discuss the potential use of a new treatment for mild to moderate COVID-19 viral infection in non-hospitalized patients.  This patient is a 71 y.o. female that meets the FDA criteria for Emergency Use Authorization of COVID monoclonal antibody sotrovimab.  Has a (+) direct SARS-CoV-2 viral test result  Has mild or moderate COVID-19   Is NOT hospitalized due to COVID-19  Is within 10 days of symptom onset  Has at least one of the high risk factor(s) for progression to severe COVID-19 and/or hospitalization as defined in EUA.  Specific high risk criteria : Older age (>/= 68 yo) and BMI > 25   I have spoken and communicated the following to the patient or parent/caregiver regarding COVID monoclonal antibody treatment:  1. FDA has authorized the emergency use for the treatment of mild to moderate COVID-19 in adults and pediatric patients with positive results of direct SARS-CoV-2 viral testing who are 87 years of age and older weighing at least 40 kg, and who are at high risk for progressing to severe COVID-19 and/or hospitalization.  2. The significant known and potential risks and benefits of COVID monoclonal antibody, and the extent to which such potential risks and benefits are unknown.  3. Information on available alternative treatments and the risks and benefits of those alternatives, including clinical trials.  4. Patients treated with COVID monoclonal antibody should continue to self-isolate and use infection control measures (e.g., wear mask, isolate, social distance, avoid sharing personal items, clean and disinfect "high touch" surfaces, and frequent handwashing) according to CDC guidelines.   5. The patient or parent/caregiver has the option to accept or refuse COVID monoclonal antibody treatment.  After reviewing this information with the patient, the patient has DECLINED offer to receive the infusion. [name], NP 02/06/2020 4:47 PM

## 2020-02-09 ENCOUNTER — Telehealth (HOSPITAL_COMMUNITY): Payer: Self-pay

## 2020-02-09 NOTE — Telephone Encounter (Signed)
Spoke to [place] on [place] behalf today attempting to schedule a home visit however she reports she is COVID + and does not want me to come over. I offered to fill her pill box outside and she refused same stating her and her son will fill it. Patient understands to reach out if she needs help or has any questions. Call complete and [place] updated.

## 2020-02-10 ENCOUNTER — Telehealth: Payer: Self-pay

## 2020-02-10 NOTE — Telephone Encounter (Signed)
I spoke with the patient and let her know that I ordered her a new monitor and she should receive it in 7-10 business days. Pt verbalized understanding and thanked me for the call.

## 2020-02-16 ENCOUNTER — Ambulatory Visit (INDEPENDENT_AMBULATORY_CARE_PROVIDER_SITE_OTHER): Payer: Medicare Other

## 2020-02-16 DIAGNOSIS — I428 Other cardiomyopathies: Secondary | ICD-10-CM

## 2020-02-16 LAB — CUP PACEART REMOTE DEVICE CHECK
Battery Remaining Longevity: 9 mo
Battery Voltage: 2.85 V
Brady Statistic AP VP Percent: 0.4 %
Brady Statistic AP VS Percent: 0.02 %
Brady Statistic AS VP Percent: 98.18 %
Brady Statistic AS VS Percent: 1.4 %
Brady Statistic RA Percent Paced: 0.42 %
Brady Statistic RV Percent Paced: 3.8 %
Date Time Interrogation Session: 20220120153502
HighPow Impedance: 64 Ohm
Implantable Lead Implant Date: 20141023
Implantable Lead Implant Date: 20141023
Implantable Lead Implant Date: 20141023
Implantable Lead Location: 753858
Implantable Lead Location: 753859
Implantable Lead Location: 753860
Implantable Lead Model: 4298
Implantable Lead Model: 5076
Implantable Lead Model: 6935
Implantable Pulse Generator Implant Date: 20141023
Lead Channel Impedance Value: 304 Ohm
Lead Channel Impedance Value: 342 Ohm
Lead Channel Impedance Value: 342 Ohm
Lead Channel Impedance Value: 399 Ohm
Lead Channel Impedance Value: 418 Ohm
Lead Channel Impedance Value: 418 Ohm
Lead Channel Impedance Value: 475 Ohm
Lead Channel Impedance Value: 513 Ohm
Lead Channel Impedance Value: 551 Ohm
Lead Channel Impedance Value: 551 Ohm
Lead Channel Impedance Value: 551 Ohm
Lead Channel Impedance Value: 589 Ohm
Lead Channel Impedance Value: 608 Ohm
Lead Channel Pacing Threshold Amplitude: 0.5 V
Lead Channel Pacing Threshold Amplitude: 0.625 V
Lead Channel Pacing Threshold Amplitude: 1.5 V
Lead Channel Pacing Threshold Pulse Width: 0.4 ms
Lead Channel Pacing Threshold Pulse Width: 0.4 ms
Lead Channel Pacing Threshold Pulse Width: 0.4 ms
Lead Channel Sensing Intrinsic Amplitude: 11.375 mV
Lead Channel Sensing Intrinsic Amplitude: 11.375 mV
Lead Channel Sensing Intrinsic Amplitude: 2.125 mV
Lead Channel Sensing Intrinsic Amplitude: 2.125 mV
Lead Channel Setting Pacing Amplitude: 1.5 V
Lead Channel Setting Pacing Amplitude: 2.5 V
Lead Channel Setting Pacing Amplitude: 3 V
Lead Channel Setting Pacing Pulse Width: 0.4 ms
Lead Channel Setting Pacing Pulse Width: 0.4 ms
Lead Channel Setting Sensing Sensitivity: 0.45 mV

## 2020-02-17 ENCOUNTER — Telehealth (HOSPITAL_COMMUNITY): Payer: Self-pay

## 2020-02-17 NOTE — Telephone Encounter (Signed)
I called Ms Otterson to see how she has been feeling with Covid. She stated she was feeling better but was concerned about not having insulin pen needles. I contacted the pharmacy who said they would get them ready for her in the next hour. I let Ms Cale know and she stated she would have someone get them for her. I will check in on her next week via phone call.   [name], EMT 02/17/20

## 2020-02-22 ENCOUNTER — Telehealth (HOSPITAL_COMMUNITY): Payer: Self-pay

## 2020-02-22 ENCOUNTER — Other Ambulatory Visit (HOSPITAL_COMMUNITY): Payer: Self-pay

## 2020-02-22 NOTE — Telephone Encounter (Signed)
Spoke to [place], will see her today around 11:00

## 2020-02-22 NOTE — Progress Notes (Signed)
Paramedicine Encounter    Patient ID: [name], female    DOB: Jun 28, 1950, 70 y.o.   MRN: [id number]   Patient Care Team: Donald Prose, MD as PCP - General (Family Medicine) Jorge Ny, LCSW as Social Worker (Licensed Clinical Social Worker)  Patient Active Problem List   Diagnosis Date Noted  . Right foot infection 10/22/2017  . Cellulitis in diabetic foot ([place]) 10/22/2017  . Hyperglycemia 10/22/2017  . Lumbar radiculopathy 09/18/2017  . Degenerative spondylolisthesis 09/18/2017  . Atrial fibrillation ([place]) 02/11/2017  . Paroxysmal A-fib ([place])   . Biventricular automatic implantable cardioverter defibrillator in situ   . Sepsis ([place]) 04/20/2016  . UTI (urinary tract infection) 04/20/2016  . Dyspnea 07/19/2015  . Interstitial lung disease ([place]) 11/20/2014  . SIRS (systemic inflammatory response syndrome) ([place]) 02/03/2014  . IDDM (insulin dependent diabetes mellitus) 02/03/2014  . Tachycardia 06/14/2013  . Chronic systolic CHF (congestive heart failure) ([place]) 09/29/2012  . Hypoxia 03/06/2012  . Hypertensive heart disease 03/06/2012  . Nonischemic cardiomyopathy ([place]) 03/06/2012  . Tobacco abuse 03/06/2012  . Type 2 diabetes mellitus ([place]) 03/06/2012  . Hypertension 03/06/2012  . CAD (coronary artery disease), native coronary artery 03/06/2012  . Hypokalemia 03/06/2012  . Acute bronchitis 02/01/2009  . Sleep apnea 02/01/2009  . Chest pain 02/01/2009    Current Outpatient Medications:  .  albuterol (VENTOLIN HFA) 108 (90 Base) MCG/ACT inhaler, Inhale 2 puffs into the lungs every 6 (six) hours as needed for wheezing or shortness of breath., Disp: 18 g, Rfl: 0 .  atorvastatin (LIPITOR) 40 MG tablet, TAKE 1 TABLET(40 MG) BY MOUTH DAILY, Disp: 30 tablet, Rfl: 11 .  baclofen (LIORESAL) 10 MG tablet, TK 1 T PO BID PRN, Disp: , Rfl:  .  benzonatate (TESSALON PERLES) 100 MG capsule, Take 2 capsules (200 mg total) by mouth at bedtime. FOR DISRUPTIVE NIGHT TIME COUGH, Disp: 20  capsule, Rfl: 0 .  carvedilol (COREG) 25 MG tablet, TAKE 1 TABLET(25 MG) BY MOUTH TWICE DAILY WITH A MEAL, Disp: 180 tablet, Rfl: 1 .  ELIQUIS 5 MG TABS tablet, TAKE 1 TABLET(5 MG) BY MOUTH TWICE DAILY, Disp: 60 tablet, Rfl: 1 .  erythromycin ophthalmic ointment, Place a 1/2 inch ribbon of ointment into the lower eyelid up to 6 times a day for 3-5 days, Disp: 3.5 g, Rfl: 0 .  furosemide (LASIX) 40 MG tablet, Take 1.5 tablets (60 mg total) by mouth in the morning AND 1 tablet (40 mg total) every evening., Disp: 75 tablet, Rfl: 3 .  gabapentin (NEURONTIN) 100 MG capsule, TK 1 C PO TID, Disp: , Rfl:  .  glucose blood (FREESTYLE TEST STRIPS) test strip, Use as instructed, Disp: 100 each, Rfl: 0 .  glucose monitoring kit (FREESTYLE) monitoring kit, 1 each by Does not apply route 4 (four) times daily - after meals and at bedtime. 1 month Diabetic Testing Supplies for QAC-QHS accuchecks., Disp: 1 each, Rfl: 1 .  HUMALOG KWIKPEN 100 UNIT/ML KwikPen, Sliding scale, Disp: , Rfl:  .  insulin glargine (LANTUS) 100 UNIT/ML Solostar Pen, Inject 60 Units into the skin at bedtime., Disp: , Rfl:  .  Insulin Pen Needle 32G X 8 MM MISC, Use as directed, Disp: 100 each, Rfl: 0 .  isosorbide-hydrALAZINE (BIDIL) 20-37.5 MG tablet, Take 2 tablets by mouth 3 (three) times daily., Disp: 180 tablet, Rfl: 6 .  Lancets (FREESTYLE) lancets, Use as instructed, Disp: 100 each, Rfl: 0 .  metFORMIN (GLUCOPHAGE) 1000 MG tablet, Take 1,000 mg by  mouth 2 (two) times daily with a meal., Disp: , Rfl:  .  Oxycodone HCl 10 MG TABS, Take 10 mg by mouth 3 (three) times daily as needed. (Patient not taking: Reported on 01/12/2020), Disp: , Rfl:  .  potassium chloride SA (KLOR-CON) 20 MEQ tablet, TAKE 3 TABLETS(60 MEQ) BY MOUTH DAILY, Disp: 270 tablet, Rfl: 3 .  sacubitril-valsartan (ENTRESTO) 97-103 MG, Take 1 tablet by mouth 2 (two) times daily., Disp: 180 tablet, Rfl: 3 .  spironolactone (ALDACTONE) 25 MG tablet, TAKE 1 TABLET(25 MG) BY  MOUTH DAILY, Disp: 90 tablet, Rfl: 3 .  Tafamidis (VYNDAMAX) 61 MG CAPS, Take 61 mg by mouth daily., Disp: 90 capsule, Rfl: 3 .  XIIDRA 5 % SOLN, Place 1 drop into both eyes 2 (two) times daily., Disp: , Rfl:  Allergies  Allergen Reactions  . Morphine And Related Nausea Only    Severe nausea  . Penicillins Other (See Comments)    Pt states she has had a pain in her leg since a penicillin injection 2 months ago (reported 08/31/17).  Has tolerated amoxicillin oral.  Has patient had a PCN reaction causing immediate rash, facial/tongue/throat swelling, SOB or lightheadedness with hypotension: No Has patient had a PCN reaction causing severe rash involving mucus membranes or skin necrosis: No Has patient had a PCN reaction that required hospitalization: No Has patient had a PCN reaction occurring within the last 10 years: Yes If a     Social History   Socioeconomic History  . Marital status: Single    Spouse name: Not on file  . Number of children: 4  . Years of education: 45  . Highest education level: Not on file  Occupational History  . Not on file  Tobacco Use  . Smoking status: Former Smoker    Packs/day: 1.00    Years: 40.00    Pack years: 40.00    Types: Cigarettes    Start date: 06/23/1972    Quit date: 03/26/2012    Years since quitting: 7.9  . Smokeless tobacco: Never Used  Vaping Use  . Vaping Use: Never used  Substance and Sexual Activity  . Alcohol use: No  . Drug use: No  . Sexual activity: Yes  Other Topics Concern  . Not on file  Social History Narrative  . Not on file   Social Determinants of Health   Financial Resource Strain: Not on file  Food Insecurity: Not on file  Transportation Needs: Not on file  Physical Activity: Not on file  Stress: Not on file  Social Connections: Not on file  Intimate Partner Violence: Not on file    Physical Exam Vitals reviewed.  Constitutional:      Appearance: She is normal weight.  HENT:     Head:  Normocephalic.     Nose: Nose normal.     Mouth/Throat:     Mouth: Mucous membranes are moist.     Pharynx: Oropharynx is clear.  Eyes:     Pupils: Pupils are equal, round, and reactive to light.  Cardiovascular:     Rate and Rhythm: Normal rate and regular rhythm.     Pulses: Normal pulses.     Heart sounds: Normal heart sounds.  Pulmonary:     Effort: Pulmonary effort is normal.     Breath sounds: Normal breath sounds.  Abdominal:     General: Abdomen is flat.     Palpations: Abdomen is soft.  Musculoskeletal:        General:  Normal range of motion.     Cervical back: Normal range of motion.     Right lower leg: No edema.     Left lower leg: No edema.  Skin:    General: Skin is warm and dry.     Capillary Refill: Capillary refill takes less than 2 seconds.  Neurological:     General: No focal deficit present.     Mental Status: She is alert. Mental status is at baseline.  Psychiatric:        Mood and Affect: Mood normal.     Arrived for home visit for [facility]. She was alert and oriented reporting she feels much better than last week. [name] has no present symptoms other than a lingering non productive cough. Vitals were obtained and are as recorded. Medications were reviewed and 2 pill boxes were filled.   Pillbox #1 and #2 missing Spironolactone  Pillbox #2 missing Bidil   BG- 330  Weight today- 234lbs    REFILLS: -bidil -spironolactone -entresto -potassium -lasix -metformin  -atorvastatin  Future Appointments  Date Time Provider [place]  03/15/2020  8:00 AM MC ECHO OP 1 [facility]  03/15/2020  9:00 AM Larey Dresser, MD [facility] None  05/17/2020  4:00 PM [facility] DEVICE REMOTES [facility]  08/16/2020  4:00 PM [facility] DEVICE REMOTES [facility]  11/15/2020  4:00 PM [facility] DEVICE REMOTES [facility]  01/11/2021  9:15 AM Bernarda Caffey, MD [facility] None  02/14/2021  4:00 PM [facility] DEVICE  REMOTES [facility]  05/16/2021  4:00 PM [facility] DEVICE REMOTES [facility]  08/15/2021  4:00 PM [facility] DEVICE REMOTES [facility]  11/14/2021  4:00 PM [facility] DEVICE REMOTES [facility]     ACTION: Home visit completed Next visit planned for two weeks

## 2020-02-23 ENCOUNTER — Other Ambulatory Visit (HOSPITAL_COMMUNITY): Payer: Self-pay | Admitting: *Deleted

## 2020-02-23 MED ORDER — ISOSORB DINITRATE-HYDRALAZINE 20-37.5 MG PO TABS
2.0000 | ORAL_TABLET | Freq: Three times a day (TID) | ORAL | 6 refills | Status: DC
Start: 2020-02-23 — End: 2020-08-15

## 2020-02-29 ENCOUNTER — Other Ambulatory Visit (HOSPITAL_COMMUNITY): Payer: Self-pay

## 2020-02-29 NOTE — Progress Notes (Signed)
Remote ICD transmission.   

## 2020-03-01 NOTE — Progress Notes (Signed)
Paramedicine Encounter    Patient ID: [name], female    DOB: [birth year], 70 y.o.   MRN: [id number]  Arrived at patient home to complete med reconciliation. I reviewed medication list and filled pill box accordingly. I will be back next week for a complete home visit.    ACTION: Home visit completed Next visit planned for one week

## 2020-03-07 ENCOUNTER — Telehealth (HOSPITAL_COMMUNITY): Payer: Self-pay

## 2020-03-07 NOTE — Telephone Encounter (Signed)
Spoke to [facility] who reports she si doing fine and still has a full pill box. I will plan to follow up next week in the home. Call complete.

## 2020-03-08 ENCOUNTER — Telehealth: Payer: Self-pay | Admitting: Emergency Medicine

## 2020-03-08 NOTE — Telephone Encounter (Signed)
Patient scheduled for monthly battery checks. Estimated ERI 9 months.

## 2020-03-12 ENCOUNTER — Other Ambulatory Visit (HOSPITAL_COMMUNITY): Payer: Self-pay

## 2020-03-12 NOTE — Progress Notes (Signed)
Paramedicine Encounter    Patient ID: [name], female    DOB: 01-31-1950, 70 y.o.   MRN: [id number]   Patient Care Team: Donald Prose, MD as PCP - General (Family Medicine) Jorge Ny, LCSW as Social Worker (Licensed Clinical Social Worker)  Patient Active Problem List   Diagnosis Date Noted  . Right foot infection 10/22/2017  . Cellulitis in diabetic foot ([place]) 10/22/2017  . Hyperglycemia 10/22/2017  . Lumbar radiculopathy 09/18/2017  . Degenerative spondylolisthesis 09/18/2017  . Atrial fibrillation ([place]) 02/11/2017  . Paroxysmal A-fib ([place])   . Biventricular automatic implantable cardioverter defibrillator in situ   . Sepsis ([place]) 04/20/2016  . UTI (urinary tract infection) 04/20/2016  . Dyspnea 07/19/2015  . Interstitial lung disease ([place]) 11/20/2014  . SIRS (systemic inflammatory response syndrome) ([place]) 02/03/2014  . IDDM (insulin dependent diabetes mellitus) 02/03/2014  . Tachycardia 06/14/2013  . Chronic systolic CHF (congestive heart failure) ([place]) 09/29/2012  . Hypoxia 03/06/2012  . Hypertensive heart disease 03/06/2012  . Nonischemic cardiomyopathy ([place]) 03/06/2012  . Tobacco abuse 03/06/2012  . Type 2 diabetes mellitus ([place]) 03/06/2012  . Hypertension 03/06/2012  . CAD (coronary artery disease), native coronary artery 03/06/2012  . Hypokalemia 03/06/2012  . Acute bronchitis 02/01/2009  . Sleep apnea 02/01/2009  . Chest pain 02/01/2009    Current Outpatient Medications:  .  albuterol (VENTOLIN HFA) 108 (90 Base) MCG/ACT inhaler, Inhale 2 puffs into the lungs every 6 (six) hours as needed for wheezing or shortness of breath., Disp: 18 g, Rfl: 0 .  atorvastatin (LIPITOR) 40 MG tablet, TAKE 1 TABLET(40 MG) BY MOUTH DAILY (Patient taking differently: at bedtime. TAKE 1 TABLET(40 MG) BY MOUTH DAILY), Disp: 30 tablet, Rfl: 11 .  baclofen (LIORESAL) 10 MG tablet, , Disp: , Rfl:  .  benzonatate (TESSALON PERLES) 100 MG capsule, Take 2 capsules (200 mg total) by  mouth at bedtime. FOR DISRUPTIVE NIGHT TIME COUGH, Disp: 20 capsule, Rfl: 0 .  carvedilol (COREG) 25 MG tablet, TAKE 1 TABLET(25 MG) BY MOUTH TWICE DAILY WITH A MEAL, Disp: 180 tablet, Rfl: 1 .  ELIQUIS 5 MG TABS tablet, TAKE 1 TABLET(5 MG) BY MOUTH TWICE DAILY, Disp: 60 tablet, Rfl: 1 .  erythromycin ophthalmic ointment, Place a 1/2 inch ribbon of ointment into the lower eyelid up to 6 times a day for 3-5 days, Disp: 3.5 g, Rfl: 0 .  furosemide (LASIX) 40 MG tablet, Take 1.5 tablets (60 mg total) by mouth in the morning AND 1 tablet (40 mg total) every evening., Disp: 75 tablet, Rfl: 3 .  gabapentin (NEURONTIN) 100 MG capsule, TK 1 C PO TID, Disp: , Rfl:  .  glucose blood (FREESTYLE TEST STRIPS) test strip, Use as instructed, Disp: 100 each, Rfl: 0 .  glucose monitoring kit (FREESTYLE) monitoring kit, 1 each by Does not apply route 4 (four) times daily - after meals and at bedtime. 1 month Diabetic Testing Supplies for QAC-QHS accuchecks., Disp: 1 each, Rfl: 1 .  HUMALOG KWIKPEN 100 UNIT/ML KwikPen, Sliding scale, Disp: , Rfl:  .  insulin glargine (LANTUS) 100 UNIT/ML Solostar Pen, Inject 60 Units into the skin at bedtime., Disp: , Rfl:  .  Insulin Pen Needle 32G X 8 MM MISC, Use as directed, Disp: 100 each, Rfl: 0 .  isosorbide-hydrALAZINE (BIDIL) 20-37.5 MG tablet, Take 2 tablets by mouth 3 (three) times daily., Disp: 180 tablet, Rfl: 6 .  Lancets (FREESTYLE) lancets, Use as instructed, Disp: 100 each, Rfl: 0 .  metFORMIN (GLUCOPHAGE)  1000 MG tablet, Take 1,000 mg by mouth 2 (two) times daily with a meal., Disp: , Rfl:  .  potassium chloride SA (KLOR-CON) 20 MEQ tablet, TAKE 3 TABLETS(60 MEQ) BY MOUTH DAILY, Disp: 270 tablet, Rfl: 3 .  sacubitril-valsartan (ENTRESTO) 97-103 MG, Take 1 tablet by mouth 2 (two) times daily., Disp: 180 tablet, Rfl: 3 .  spironolactone (ALDACTONE) 25 MG tablet, TAKE 1 TABLET(25 MG) BY MOUTH DAILY, Disp: 90 tablet, Rfl: 3 .  Tafamidis (VYNDAMAX) 61 MG CAPS, Take 61 mg  by mouth daily., Disp: 90 capsule, Rfl: 3 .  XIIDRA 5 % SOLN, Place 1 drop into both eyes 2 (two) times daily., Disp: , Rfl:  .  Oxycodone HCl 10 MG TABS, Take 10 mg by mouth 3 (three) times daily as needed. (Patient not taking: No sig reported), Disp: , Rfl:  Allergies  Allergen Reactions  . Morphine And Related Nausea Only    Severe nausea  . Penicillins Other (See Comments)    Pt states she has had a pain in her leg since a penicillin injection 2 months ago (reported 08/31/17).  Has tolerated amoxicillin oral.  Has patient had a PCN reaction causing immediate rash, facial/tongue/throat swelling, SOB or lightheadedness with hypotension: No Has patient had a PCN reaction causing severe rash involving mucus membranes or skin necrosis: No Has patient had a PCN reaction that required hospitalization: No Has patient had a PCN reaction occurring within the last 10 years: Yes If a      Social History   Socioeconomic History  . Marital status: Single    Spouse name: Not on file  . Number of children: 4  . Years of education: 98  . Highest education level: Not on file  Occupational History  . Not on file  Tobacco Use  . Smoking status: Former Smoker    Packs/day: 1.00    Years: 40.00    Pack years: 40.00    Types: Cigarettes    Start date: 06/23/1972    Quit date: 03/26/2012    Years since quitting: 7.9  . Smokeless tobacco: Never Used  Vaping Use  . Vaping Use: Never used  Substance and Sexual Activity  . Alcohol use: No  . Drug use: No  . Sexual activity: Yes  Other Topics Concern  . Not on file  Social History Narrative  . Not on file   Social Determinants of Health   Financial Resource Strain: Not on file  Food Insecurity: Not on file  Transportation Needs: Not on file  Physical Activity: Not on file  Stress: Not on file  Social Connections: Not on file  Intimate Partner Violence: Not on file    Physical Exam      Future Appointments  Date Time Provider  [place]  03/15/2020  8:00 AM MC ECHO OP 1 [facility]  03/15/2020  9:00 AM Larey Dresser, MD [facility] None  03/16/2020  7:05 AM [facility] DEVICE REMOTES [facility]  04/18/2020  7:20 AM [facility] DEVICE REMOTES [facility]  05/17/2020  4:00 PM [facility] DEVICE REMOTES [facility]  06/19/2020  7:10 AM [facility] DEVICE REMOTES [facility]  07/23/2020  7:00 AM [facility] DEVICE REMOTES [facility]  08/16/2020  4:00 PM [facility] DEVICE REMOTES [facility]  09/18/2020  7:10 AM [facility] DEVICE REMOTES [facility]  10/22/2020  7:05 AM [facility] DEVICE REMOTES [facility]  11/15/2020  4:00 PM [facility] DEVICE REMOTES [facility]  12/24/2020  7:15 AM [facility] DEVICE REMOTES [facility]  01/11/2021  9:15 AM Bernarda Caffey, MD [facility] None  02/14/2021  4:00 PM [facility] DEVICE REMOTES [facility]  05/16/2021  4:00 PM [facility] DEVICE REMOTES [facility]  08/15/2021  4:00 PM [facility] DEVICE REMOTES [facility]  11/14/2021  4:00 PM [facility] DEVICE REMOTES [facility]    BP (!) 144/70   Pulse 82   Resp 20   Wt 238 lb (108 kg)   SpO2 98%   BMI 37.28 kg/m  CBG EMS-496 Weight yesterday-? Last visit weight-?   Pt called heather to report she ran out of meds but she should have had enough through Wednesday, pt is not sure why she ran out of meds, apparently she has taken too many at some point.  She denies sob, no dizziness, no c/p, she just feels "tired, sleeps all the time and like she got hit by truck" She denies any pain. She seemed groggy during our visit, fell asleep on couch for a minute but then woke back up and appeared normally awake.  meds verified and 2 pill boxes for 2 wks were filled.  Nira Conn will take care of the refills noted below.  He CBG was almost 500, she is out of  her humalog-she is taking 60U of lantus during visit now. I advised for her to eat something before she goes to bed as well.  --refills needed by next visit--[id number], [id number], [id number] vyndamax  She is out of her humalog insulin as well for a long time.  She only has enough potassium for the next few days through Friday AM dose.   Marylouise Stacks, [facility] Paramedic  03/12/20

## 2020-03-14 ENCOUNTER — Telehealth (HOSPITAL_COMMUNITY): Payer: Self-pay

## 2020-03-14 MED FILL — VYNDAMAX 61 MG CAPS: 61 | 30 days supply | Qty: 30 | Fill #4

## 2020-03-14 NOTE — Telephone Encounter (Signed)
Reminded [name] of clinic visit tomorrow, she agreed and states she will be there. Call complete.

## 2020-03-15 ENCOUNTER — Other Ambulatory Visit: Payer: Self-pay

## 2020-03-15 ENCOUNTER — Ambulatory Visit (HOSPITAL_COMMUNITY)
Admission: RE | Admit: 2020-03-15 | Discharge: 2020-03-15 | Disposition: A | Discharge status: Home / Self Care | Payer: Medicare Other | Source: Ambulatory Visit | Attending: Adult Health | Admitting: Adult Health

## 2020-03-15 ENCOUNTER — Ambulatory Visit (HOSPITAL_BASED_OUTPATIENT_CLINIC_OR_DEPARTMENT_OTHER)
Admission: RE | Admit: 2020-03-15 | Discharge: 2020-03-15 | Disposition: A | Discharge status: Home / Self Care | Payer: Medicare Other | Source: Ambulatory Visit | Attending: Cardiology | Admitting: Cardiology

## 2020-03-15 ENCOUNTER — Encounter (HOSPITAL_COMMUNITY): Payer: Self-pay | Admitting: Cardiology

## 2020-03-15 ENCOUNTER — Other Ambulatory Visit (HOSPITAL_COMMUNITY): Payer: Self-pay

## 2020-03-15 VITALS — BP 112/70 | HR 76 | Wt 241.0 lb

## 2020-03-15 DIAGNOSIS — Z95810 Presence of automatic (implantable) cardiac defibrillator: Secondary | ICD-10-CM | POA: Diagnosis not present

## 2020-03-15 DIAGNOSIS — I110 Hypertensive heart disease with heart failure: Secondary | ICD-10-CM | POA: Diagnosis not present

## 2020-03-15 DIAGNOSIS — Z7984 Long term (current) use of oral hypoglycemic drugs: Secondary | ICD-10-CM | POA: Insufficient documentation

## 2020-03-15 DIAGNOSIS — I2510 Atherosclerotic heart disease of native coronary artery without angina pectoris: Secondary | ICD-10-CM | POA: Diagnosis not present

## 2020-03-15 DIAGNOSIS — Z8616 Personal history of COVID-19: Secondary | ICD-10-CM | POA: Insufficient documentation

## 2020-03-15 DIAGNOSIS — G4733 Obstructive sleep apnea (adult) (pediatric): Secondary | ICD-10-CM | POA: Diagnosis not present

## 2020-03-15 DIAGNOSIS — E785 Hyperlipidemia, unspecified: Secondary | ICD-10-CM | POA: Insufficient documentation

## 2020-03-15 DIAGNOSIS — Z79899 Other long term (current) drug therapy: Secondary | ICD-10-CM | POA: Diagnosis not present

## 2020-03-15 DIAGNOSIS — I5022 Chronic systolic (congestive) heart failure: Secondary | ICD-10-CM | POA: Diagnosis present

## 2020-03-15 DIAGNOSIS — Z7901 Long term (current) use of anticoagulants: Secondary | ICD-10-CM | POA: Insufficient documentation

## 2020-03-15 DIAGNOSIS — Z794 Long term (current) use of insulin: Secondary | ICD-10-CM | POA: Diagnosis not present

## 2020-03-15 DIAGNOSIS — G4730 Sleep apnea, unspecified: Secondary | ICD-10-CM

## 2020-03-15 DIAGNOSIS — I428 Other cardiomyopathies: Secondary | ICD-10-CM | POA: Diagnosis not present

## 2020-03-15 LAB — BASIC METABOLIC PANEL
Anion gap: 9 (ref 5–15)
BUN: 14 mg/dL (ref 8–23)
CO2: 28 mmol/L (ref 22–32)
Calcium: 9 mg/dL (ref 8.9–10.3)
Chloride: 101 mmol/L (ref 98–111)
Creatinine, Ser: 1 mg/dL (ref 0.44–1.00)
GFR, Estimated: >60 mL/min (ref >60)
Glucose, Bld: 288 mg/dL — ABNORMAL HIGH (ref 70–99)
Potassium: 3.5 mmol/L (ref 3.5–5.1)
Sodium: 138 mmol/L (ref 135–145)

## 2020-03-15 LAB — CBC
HCT: 33.7 % — ABNORMAL LOW (ref 36.0–46.0)
Hemoglobin: 11.3 g/dL — ABNORMAL LOW (ref 12.0–15.0)
MCH: 29.9 pg (ref 26.0–34.0)
MCHC: 33.5 g/dL (ref 30.0–36.0)
MCV: 89.2 fL (ref 80.0–100.0)
Platelets: 315 10*3/uL (ref 150–400)
RBC: 3.78 MIL/uL — ABNORMAL LOW (ref 3.87–5.11)
RDW: 12.2 % (ref 11.5–15.5)
WBC: 7.8 10*3/uL (ref 4.0–10.5)
nRBC: 0 % (ref 0.0–0.2)

## 2020-03-15 LAB — ECHOCARDIOGRAM COMPLETE
Area-P 1/2: 2.17 cm2
S' Lateral: 3.6 cm

## 2020-03-15 MED ORDER — FUROSEMIDE 20 MG PO TABS
60.0000 mg | ORAL_TABLET | Freq: Every day | ORAL | 3 refills | Status: DC
Start: 2020-03-15 — End: 2020-08-15

## 2020-03-15 NOTE — Progress Notes (Signed)
Paramedicine Encounter    Patient ID: [name], female    DOB: Jun 19, 1950, 70 y.o.   MRN: [id number]   [name] in the clinic today where she was seen by Dr. Sarina Wilson had her ECHO today and Dr. Aundra Wilson reports her EF is within normal range now and she is improving! [name] reports she is up peeing a lot at night and not getting any sleep. Dr. Aundra Wilson reports her fluid status is good at present and decided to remove her evening dose of her fluid pill. [name] was made aware of which one this was to remove from her pill box and understood. She agreed to have me come out in two weeks. Dr. Aundra Wilson also ordered a home sleep study. I will assist in following up with same. Clinic will obtain labs today. Clinic visit complete.     ACTION: Home visit completed Next visit planned for two weeks

## 2020-03-15 NOTE — Progress Notes (Signed)
Patient ID: [name], female   DOB: 06/03/1950, 70 y.o.   MRN: [id number]   Advanced Heart Failure Clinic Note   PCP: Dr. Alyson Ingles Cardiology: Dr Dora Sims is a 70 y.o. female with history of nonischemic cardiomyopathy.  She was admitted with CHF exacerbation in 02/2012.  EF 20-25% on echo, LHC with nonobstructive CAD.  She was started on cardiac meds and discharged.  In 07/2012, she was admitted again with CHF exacerbation.  She had run out of Lasix.  She was taking her other heart medications as ordered, however.  She was diuresed and discharged. She had a chronic LBBB, and Medtronic CRT-D device was placed in 10/14.  She was admitted in 6/16 with hypertensive emergency and CHF exacerbation.   Also of note, she had PFTs in 9/16 showing restrictive spirometry with low lung volume and DLCO.  She was supposed to get a high resolution CT to evaluate for interstitial lung disease but never had the study.     Admitted March 2018 with urosepsis--> E Coli Bacteremia. Completed antibiotic course. EF was down from previous to 30-35%. Also had atrial fibrillation so she was loaded on amiodarone. Placed on eliquis. Discharge weight was 208 pounds.  She is now off amiodarone.   Ad[facility] to [facility] 1/16 -> 02/13/16 with CP and dizziness. Found to be in Afib. Converted spontaneously overnight with BB and diuresis.   Echo 11/19 showed EF 35-40%.  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    Patient had COVID-19 infection in 10/20.   Echo in 11/20 showed EF 40-45% with mild LVH and mildly decreased RV systolic function.  Echo was done today and reviewed, EF up to 55-60%, moderate LVH, normal RV.   She returns for followup of CHF.  Symptomatically doing well.  No significant exertional dyspnea.  No chest pain.  She does get sleepy during the day.  She has a hard time sleeping at night as she says that she is up multiple times urinating.  Weight  is stable.   Medtronic device interrogation: Stable thoracic impedance.       Labs (2/14): SPEP negative, UPEP negative, HIV negative Labs (04/14/2017): K 3.8 Creatinine 0.94  Labs (9/19): K 3.3, creatinine 0.86 Labs (1/20): K 4, creatinine 0.87 Labs (2/20): K 4.2, creatinine 1.16 Labs (8/20): K 3.8, creatinine 0.78 Labs (11/20): LDL 104 Labs (12/20): K 3.6, creatinine 0.93 Labs (7/21): K 3.5, creatinine 1.0 Labs (12/21): K 3.6, creatinine 1.05, hgbA1c 8  PMH: 1. HTN 2. Type II diabetes with neuropathy 3. Nonischemic cardiomyopathy: ? Due to HTN versus LBBB CMP.  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  Echo (2/14) with EF 20-25%.  Echo (7/14) with EF 25%, diffuse hypokinesis.  HIV, SPEP, UPEP negative.  Has LBBB. CRT-D 10/2012 (Medtronic).  Echo (4/15) with EF 45-50%, mild diffuse hypokinesis, PA systolic pressure 38 mmHg.  Echo (6/16) with EF 40-45%, mild LVH, septal and inferior hypokinesis.   - Echo (3/18): EF 30-35%. Grade 1 DD - Echo (6/18): EF 45-50%, moderate LVH, normal RV size with mildly decreased systolic function.  - Echo (11/19): EF 35-40%, moderate LVH, moderate diastolic dysfunction, normal RV size with mildly decreased systolic function, PASP 43 mmHg.  - Echo (11/20): EF 40-45%, mild LVH, mildly decreased RV systolic function.  - Echo (2/22): EF 55-60%, moderate LVH, normal RV.  4. Chronic LBBB 5. Right TKR 6. Bilateral THR.  7. Hyperlipidemia 8. ?COPD: Has oxygen for  use with exertion.  - PFTs (9/16) with FEV1 77%, FVC 76%, ratio 101%, TLC 63%, DLCO 41% => moderate restrictive deficit  9. Atrial fibrillation: Paroxysmal.  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  11. COVID-19 infection in 10/20.  12. OSA: CPAP.  SH: Prior smoker, quit 2/14.  Never drank ETOH.  No drugs. Lives with son.   FH: Mother with "heart trouble."   Review of systems complete and found to be negative  unless listed in HPI.    Current Outpatient Medications  Medication Sig Dispense Refill  . albuterol (VENTOLIN HFA) 108 (90 Base) MCG/ACT inhaler Inhale 2 puffs into the lungs every 6 (six) hours as needed for wheezing or shortness of breath. 18 g 0  . atorvastatin (LIPITOR) 40 MG tablet TAKE 1 TABLET(40 MG) BY MOUTH DAILY 30 tablet 11  . baclofen (LIORESAL) 10 MG tablet     . benzonatate (TESSALON PERLES) 100 MG capsule Take 2 capsules (200 mg total) by mouth at bedtime. FOR DISRUPTIVE NIGHT TIME COUGH 20 capsule 0  . carvedilol (COREG) 25 MG tablet TAKE 1 TABLET(25 MG) BY MOUTH TWICE DAILY WITH A MEAL 180 tablet 1  . ELIQUIS 5 MG TABS tablet TAKE 1 TABLET(5 MG) BY MOUTH TWICE DAILY 60 tablet 1  . gabapentin (NEURONTIN) 100 MG capsule TK 1 C PO TID    . glucose blood (FREESTYLE TEST STRIPS) test strip Use as instructed 100 each 0  . glucose monitoring kit (FREESTYLE) monitoring kit 1 each by Does not apply route 4 (four) times daily - after meals and at bedtime. 1 month Diabetic Testing Supplies for QAC-QHS accuchecks. 1 each 1  . HUMALOG KWIKPEN 100 UNIT/ML KwikPen Sliding scale    . insulin glargine (LANTUS) 100 UNIT/ML Solostar Pen Inject 60 Units into the skin at bedtime.    . Insulin Pen Needle 32G X 8 MM MISC Use as directed 100 each 0  . isosorbide-hydrALAZINE (BIDIL) 20-37.5 MG tablet Take 2 tablets by mouth 3 (three) times daily. 180 tablet 6  . Lancets (FREESTYLE) lancets Use as instructed 100 each 0  . metFORMIN (GLUCOPHAGE) 1000 MG tablet Take 1,000 mg by mouth 2 (two) times daily with a meal.    . Oxycodone HCl 10 MG TABS Take 10 mg by mouth 3 (three) times daily as needed.    . potassium chloride SA (KLOR-CON) 20 MEQ tablet TAKE 3 TABLETS(60 MEQ) BY MOUTH DAILY 270 tablet 3  . sacubitril-valsartan (ENTRESTO) 97-103 MG Take 1 tablet by mouth 2 (two) times daily. 180 tablet 3  . spironolactone (ALDACTONE) 25 MG tablet TAKE 1 TABLET(25 MG) BY MOUTH DAILY 90 tablet 3  . Tafamidis  (VYNDAMAX) 61 MG CAPS Take 61 mg by mouth daily. 90 capsule 3  . XIIDRA 5 % SOLN Place 1 drop into both eyes 2 (two) times daily.    . furosemide (LASIX) 20 MG tablet Take 3 tablets (60 mg total) by mouth daily. 270 tablet 3   No current facility-administered medications for this encounter.   Vitals:   03/15/20 0907  BP: 112/70  Pulse: 76  SpO2: 97%  Weight: 109.3 kg (241 lb)    Wt Readings from Last 3 Encounters:  03/15/20 109.3 kg (241 lb)  03/12/20 108 kg (238 lb)  02/22/20 106.1 kg (234 lb)    Physical exam General: NAD Neck: No JVD, no thyromegaly or thyroid nodule.  Lungs: Clear to auscultation bilaterally  with normal respiratory effort. CV: Nondisplaced PMI.  Heart regular S1/S2, no S3/S4, no murmur.  No peripheral edema.  No carotid bruit.  Normal pedal pulses.  Abdomen: Soft, nontender, no hepatosplenomegaly, no distention.  Skin: Intact without lesions or rashes.  Neurologic: Alert and oriented x 3.  Psych: Normal affect. Extremities: No clubbing or cyanosis.  HEENT: Normal.   Assessment/Plan: 1. Chronic systolic CHF: Nonischemic cardiomyopathy, thought to be related to HTN. S/P CRT-D (Medtronic). Echo was done today, EF improved up to 55-60%. She is not volume overloaded by exam or Optivol.  - Continue Coreg 25 mg BID.  - Continue Entresto 97/103 mg BID - Continue Bidil 2 tabs TID - Continue spiro 25 mg daily. BMET today.  - Decrease Lasix to 60 mg daily (cut out pm dose so she is not getting up at night as much).  2. Hyperlipidemia: Continue statin.  3. HTN: BP now controlled.  4. PAF: No prolonged episodes.    - Continue Eliquis for anticoagulation. CBC today. 5. 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 Genetic testing was negative (wild type).  - Continue tafamidis.  6. OSA: Strongly suspect OSA, was diagnosed in the remote past but does not have CPAP.  - Set up home sleep study.   Followup in 4 months with  APP   [name], MD  03/15/2020

## 2020-03-15 NOTE — Progress Notes (Signed)
  Echocardiogram 2D Echocardiogram has been performed.  [name] 03/15/2020, 8:58 AM

## 2020-03-15 NOTE — Patient Instructions (Addendum)
Labs done today. We will contact you only if your labs are abnormal.  DECREASE Lasix 60mg  (3 tablets) by mouth daily.  No other medication changes were made. Please continue all current medications as prescribed.  Your provider has recommended that you have a home sleep study.  We have provided you with the equipment in our office today. Please download the app and follow the instructions. YOUR PIN NUMBER IS: [id number]. Once you have completed the test you just dispose of the equipment, the information is automatically uploaded to [place] via blue-tooth technology. If your test is positive for sleep apnea and you need a home CPAP machine you will be contacted by Dr Theodosia Blender office [facility]) to set this up.  Your physician recommends that you schedule a follow-up appointment in: 4 months    If you have any questions or concerns before your next appointment please send [place] a message through [place] or call our office at [phone].    TO LEAVE A MESSAGE FOR THE NURSE SELECT OPTION 2, PLEASE LEAVE A MESSAGE INCLUDING: . YOUR NAME . DATE OF BIRTH . CALL BACK NUMBER . REASON FOR CALL**this is important as we prioritize the call backs  YOU WILL RECEIVE A CALL BACK THE SAME DAY AS LONG AS YOU CALL BEFORE 4:00 PM   Do the following things EVERYDAY: 1) Weigh yourself in the morning before breakfast. Write it down and keep it in a log. 2) Take your medicines as prescribed 3) Eat low salt foods--Limit salt (sodium) to 2000 mg per day.  4) Stay as active as you can everyday 5) Limit all fluids for the day to less than 2 liters   At the [facility], you and your health needs are our priority. As part of our continuing mission to provide you with exceptional heart care, we have created designated Provider Care Teams. These Care Teams include your primary Cardiologist (physician) and Advanced Practice Providers (APPs- Physician Assistants and Nurse Practitioners) who all work together  to provide you with the care you need, when you need it.   You may see any of the following providers on your designated Care Team at your next follow up: Marland Kitchen Dr Glori Bickers . Dr [name] . Darrick Grinder, NP . Lyda Jester, PA . Audry Riles, PharmD   Please be sure to bring in all your medications bottles to every appointment.

## 2020-03-16 ENCOUNTER — Ambulatory Visit (INDEPENDENT_AMBULATORY_CARE_PROVIDER_SITE_OTHER): Payer: Medicare Other

## 2020-03-16 DIAGNOSIS — I428 Other cardiomyopathies: Secondary | ICD-10-CM

## 2020-03-16 DIAGNOSIS — I5022 Chronic systolic (congestive) heart failure: Secondary | ICD-10-CM

## 2020-03-16 LAB — CUP PACEART REMOTE DEVICE CHECK
Battery Remaining Longevity: 8 mo
Battery Voltage: 2.83 V
Brady Statistic AP VP Percent: 0.06 %
Brady Statistic AP VS Percent: 0 %
Brady Statistic AS VP Percent: 98.67 %
Brady Statistic AS VS Percent: 1.27 %
Brady Statistic RA Percent Paced: 0.06 %
Brady Statistic RV Percent Paced: 6.06 %
Date Time Interrogation Session: 20220218001806
HighPow Impedance: 63 Ohm
Implantable Lead Implant Date: 20141023
Implantable Lead Implant Date: 20141023
Implantable Lead Implant Date: 20141023
Implantable Lead Location: 753858
Implantable Lead Location: 753859
Implantable Lead Location: 753860
Implantable Lead Model: 4298
Implantable Lead Model: 5076
Implantable Lead Model: 6935
Implantable Pulse Generator Implant Date: 20141023
Lead Channel Impedance Value: 342 Ohm
Lead Channel Impedance Value: 342 Ohm
Lead Channel Impedance Value: 342 Ohm
Lead Channel Impedance Value: 361 Ohm
Lead Channel Impedance Value: 418 Ohm
Lead Channel Impedance Value: 418 Ohm
Lead Channel Impedance Value: 456 Ohm
Lead Channel Impedance Value: 513 Ohm
Lead Channel Impedance Value: 551 Ohm
Lead Channel Impedance Value: 551 Ohm
Lead Channel Impedance Value: 589 Ohm
Lead Channel Impedance Value: 589 Ohm
Lead Channel Impedance Value: 608 Ohm
Lead Channel Pacing Threshold Amplitude: 0.5 V
Lead Channel Pacing Threshold Amplitude: 0.5 V
Lead Channel Pacing Threshold Amplitude: 1.5 V
Lead Channel Pacing Threshold Pulse Width: 0.4 ms
Lead Channel Pacing Threshold Pulse Width: 0.4 ms
Lead Channel Pacing Threshold Pulse Width: 0.4 ms
Lead Channel Sensing Intrinsic Amplitude: 13.25 mV
Lead Channel Sensing Intrinsic Amplitude: 13.25 mV
Lead Channel Sensing Intrinsic Amplitude: 2.25 mV
Lead Channel Sensing Intrinsic Amplitude: 2.25 mV
Lead Channel Setting Pacing Amplitude: 1.5 V
Lead Channel Setting Pacing Amplitude: 2.5 V
Lead Channel Setting Pacing Amplitude: 3 V
Lead Channel Setting Pacing Pulse Width: 0.4 ms
Lead Channel Setting Pacing Pulse Width: 0.4 ms
Lead Channel Setting Sensing Sensitivity: 0.45 mV

## 2020-03-20 NOTE — Progress Notes (Signed)
Remote ICD transmission.   

## 2020-03-26 ENCOUNTER — Other Ambulatory Visit (HOSPITAL_COMMUNITY): Payer: Self-pay

## 2020-03-26 NOTE — Progress Notes (Signed)
Paramedicine Encounter    Patient ID: [name], female    DOB: 11-21-1950, 70 y.o.   MRN: [id number]   Patient Care Team: Donald Prose, MD as PCP - General (Family Medicine) Jorge Ny, LCSW as Social Worker (Licensed Clinical Social Worker)  Patient Active Problem List   Diagnosis Date Noted  . Right foot infection 10/22/2017  . Cellulitis in diabetic foot ([place]) 10/22/2017  . Hyperglycemia 10/22/2017  . Lumbar radiculopathy 09/18/2017  . Degenerative spondylolisthesis 09/18/2017  . Atrial fibrillation ([facility]) 02/11/2017  . Paroxysmal A-fib ([place])   . Biventricular automatic implantable cardioverter defibrillator in situ   . Sepsis ([place]) 04/20/2016  . UTI (urinary tract infection) 04/20/2016  . Dyspnea 07/19/2015  . Interstitial lung disease ([place]) 11/20/2014  . SIRS (systemic inflammatory response syndrome) ([place]) 02/03/2014  . IDDM (insulin dependent diabetes mellitus) 02/03/2014  . Tachycardia 06/14/2013  . Chronic systolic CHF (congestive heart failure) ([place]) 09/29/2012  . Hypoxia 03/06/2012  . Hypertensive heart disease 03/06/2012  . Nonischemic cardiomyopathy ([place]) 03/06/2012  . Tobacco abuse 03/06/2012  . Type 2 diabetes mellitus ([place]) 03/06/2012  . Hypertension 03/06/2012  . CAD (coronary artery disease), native coronary artery 03/06/2012  . Hypokalemia 03/06/2012  . Acute bronchitis 02/01/2009  . Sleep apnea 02/01/2009  . Chest pain 02/01/2009    Current Outpatient Medications:  .  albuterol (VENTOLIN HFA) 108 (90 Base) MCG/ACT inhaler, Inhale 2 puffs into the lungs every 6 (six) hours as needed for wheezing or shortness of breath., Disp: 18 g, Rfl: 0 .  atorvastatin (LIPITOR) 40 MG tablet, TAKE 1 TABLET(40 MG) BY MOUTH DAILY, Disp: 30 tablet, Rfl: 11 .  baclofen (LIORESAL) 10 MG tablet, , Disp: , Rfl:  .  benzonatate (TESSALON PERLES) 100 MG capsule, Take 2 capsules (200 mg total) by mouth at bedtime. FOR DISRUPTIVE NIGHT TIME COUGH, Disp: 20 capsule, Rfl: 0 .   carvedilol (COREG) 25 MG tablet, TAKE 1 TABLET(25 MG) BY MOUTH TWICE DAILY WITH A MEAL, Disp: 180 tablet, Rfl: 1 .  ELIQUIS 5 MG TABS tablet, TAKE 1 TABLET(5 MG) BY MOUTH TWICE DAILY, Disp: 60 tablet, Rfl: 1 .  furosemide (LASIX) 20 MG tablet, Take 3 tablets (60 mg total) by mouth daily., Disp: 270 tablet, Rfl: 3 .  gabapentin (NEURONTIN) 100 MG capsule, TK 1 C PO TID, Disp: , Rfl:  .  glucose blood (FREESTYLE TEST STRIPS) test strip, Use as instructed, Disp: 100 each, Rfl: 0 .  glucose monitoring kit (FREESTYLE) monitoring kit, 1 each by Does not apply route 4 (four) times daily - after meals and at bedtime. 1 month Diabetic Testing Supplies for QAC-QHS accuchecks., Disp: 1 each, Rfl: 1 .  HUMALOG KWIKPEN 100 UNIT/ML KwikPen, Sliding scale, Disp: , Rfl:  .  insulin glargine (LANTUS) 100 UNIT/ML Solostar Pen, Inject 60 Units into the skin at bedtime., Disp: , Rfl:  .  Insulin Pen Needle 32G X 8 MM MISC, Use as directed, Disp: 100 each, Rfl: 0 .  isosorbide-hydrALAZINE (BIDIL) 20-37.5 MG tablet, Take 2 tablets by mouth 3 (three) times daily., Disp: 180 tablet, Rfl: 6 .  Lancets (FREESTYLE) lancets, Use as instructed, Disp: 100 each, Rfl: 0 .  metFORMIN (GLUCOPHAGE) 1000 MG tablet, Take 1,000 mg by mouth 2 (two) times daily with a meal., Disp: , Rfl:  .  Oxycodone HCl 10 MG TABS, Take 10 mg by mouth 3 (three) times daily as needed., Disp: , Rfl:  .  potassium chloride SA (KLOR-CON) 20 MEQ tablet, TAKE 3  TABLETS(60 MEQ) BY MOUTH DAILY, Disp: 270 tablet, Rfl: 3 .  sacubitril-valsartan (ENTRESTO) 97-103 MG, Take 1 tablet by mouth 2 (two) times daily., Disp: 180 tablet, Rfl: 3 .  spironolactone (ALDACTONE) 25 MG tablet, TAKE 1 TABLET(25 MG) BY MOUTH DAILY, Disp: 90 tablet, Rfl: 3 .  Tafamidis (VYNDAMAX) 61 MG CAPS, Take 61 mg by mouth daily., Disp: 90 capsule, Rfl: 3 .  XIIDRA 5 % SOLN, Place 1 drop into both eyes 2 (two) times daily., Disp: , Rfl:  Allergies  Allergen Reactions  . Morphine And  Related Nausea Only    Severe nausea  . Penicillins Other (See Comments)    Pt states she has had a pain in her leg since a penicillin injection 2 months ago (reported 08/31/17).  Has tolerated amoxicillin oral.  Has patient had a PCN reaction causing immediate rash, facial/tongue/throat swelling, SOB or lightheadedness with hypotension: No Has patient had a PCN reaction causing severe rash involving mucus membranes or skin necrosis: No Has patient had a PCN reaction that required hospitalization: No Has patient had a PCN reaction occurring within the last 10 years: Yes If a     Social History   Socioeconomic History  . Marital status: Single    Spouse name: Not on file  . Number of children: 4  . Years of education: 2  . Highest education level: Not on file  Occupational History  . Not on file  Tobacco Use  . Smoking status: Former Smoker    Packs/day: 1.00    Years: 40.00    Pack years: 40.00    Types: Cigarettes    Start date: 06/23/1972    Quit date: 03/26/2012    Years since quitting: 8.0  . Smokeless tobacco: Never Used  Vaping Use  . Vaping Use: Never used  Substance and Sexual Activity  . Alcohol use: No  . Drug use: No  . Sexual activity: Yes  Other Topics Concern  . Not on file  Social History Narrative  . Not on file   Social Determinants of Health   Financial Resource Strain: Not on file  Food Insecurity: Not on file  Transportation Needs: Not on file  Physical Activity: Not on file  Stress: Not on file  Social Connections: Not on file  Intimate Partner Violence: Not on file    Physical Exam Vitals reviewed.  Constitutional:      Appearance: She is normal weight.  HENT:     Head: Normocephalic.     Right Ear: Tympanic membrane normal.     Nose: Nose normal.     Mouth/Throat:     Mouth: Mucous membranes are moist.     Pharynx: Oropharynx is clear.  Eyes:     Pupils: Pupils are equal, round, and reactive to light.  Cardiovascular:     Rate  and Rhythm: Normal rate and regular rhythm.     Pulses: Normal pulses.     Heart sounds: Normal heart sounds.  Pulmonary:     Effort: Pulmonary effort is normal.     Breath sounds: Normal breath sounds.  Abdominal:     General: Abdomen is flat.     Palpations: Abdomen is soft.  Musculoskeletal:        General: Normal range of motion.     Cervical back: Normal range of motion.     Right lower leg: No edema.     Left lower leg: No edema.  Skin:    General: Skin is warm and  dry.     Capillary Refill: Capillary refill takes less than 2 seconds.  Neurological:     General: No focal deficit present.     Mental Status: She is alert. Mental status is at baseline.  Psychiatric:        Mood and Affect: Mood normal.     Arrived for home visit for [facility] who was alert and oriented reporting she was feeling good with no complaints. I obtained vitals and they are as noted. Joanette has been mostly compliant with medications over the last two weeks. I reviewed medications and filled 2 pill boxes accordingly. Both boxes MISSING POTASSIUM. Bernardette reports she will pick up potassium today and place same in boxes. Codee has no upcoming appointments. Twanna agreed to home visit in two weeks.  Refills: Potassium (can't be filled until 3/5) Gabapentin Eliquis      Future Appointments  Date Time Provider [place]  04/18/2020  7:20 AM [facility] DEVICE REMOTES [facility]  05/17/2020  4:00 PM [facility] DEVICE REMOTES [facility]  06/19/2020  7:10 AM [facility] DEVICE REMOTES [facility]  07/16/2020  9:00 AM [facility] PA/NP [facility] None  07/23/2020  7:00 AM [facility] DEVICE REMOTES [facility]  08/16/2020  4:00 PM [facility] DEVICE REMOTES [facility]  09/18/2020  7:10 AM [facility] DEVICE REMOTES [facility]  10/22/2020  7:05 AM [facility] DEVICE REMOTES [facility]  11/15/2020  4:00 PM  [facility] DEVICE REMOTES [facility]  12/24/2020  7:15 AM [facility] DEVICE REMOTES [facility]  01/11/2021  9:15 AM Bernarda Caffey, MD [facility] None  02/14/2021  4:00 PM [facility] DEVICE REMOTES [facility]  05/16/2021  4:00 PM [facility] DEVICE REMOTES [facility]  08/15/2021  4:00 PM [facility] DEVICE REMOTES [facility]  11/14/2021  4:00 PM [facility] DEVICE REMOTES [facility]     ACTION: Home visit completed Next visit planned for two weeks

## 2020-04-09 ENCOUNTER — Telehealth (HOSPITAL_COMMUNITY): Payer: Self-pay | Admitting: Surgery

## 2020-04-09 NOTE — Telephone Encounter (Signed)
I called and left a message for [name] regarding completion of her ordered home sleep study.  Insurance prior authorization is not needed and patient may proceed with test.

## 2020-04-11 ENCOUNTER — Other Ambulatory Visit (HOSPITAL_COMMUNITY): Payer: Self-pay

## 2020-04-11 NOTE — Progress Notes (Signed)
Paramedicine Encounter    Patient ID: [name], female    DOB: 11-21-1950, 70 y.o.   MRN: [id number]   Patient Care Team: Donald Prose, MD as PCP - General (Family Medicine) Jorge Ny, LCSW as Social Worker (Licensed Clinical Social Worker)  Patient Active Problem List   Diagnosis Date Noted  . Right foot infection 10/22/2017  . Cellulitis in diabetic foot ([place]) 10/22/2017  . Hyperglycemia 10/22/2017  . Lumbar radiculopathy 09/18/2017  . Degenerative spondylolisthesis 09/18/2017  . Atrial fibrillation ([facility]) 02/11/2017  . Paroxysmal A-fib ([place])   . Biventricular automatic implantable cardioverter defibrillator in situ   . Sepsis ([place]) 04/20/2016  . UTI (urinary tract infection) 04/20/2016  . Dyspnea 07/19/2015  . Interstitial lung disease ([place]) 11/20/2014  . SIRS (systemic inflammatory response syndrome) ([place]) 02/03/2014  . IDDM (insulin dependent diabetes mellitus) 02/03/2014  . Tachycardia 06/14/2013  . Chronic systolic CHF (congestive heart failure) ([place]) 09/29/2012  . Hypoxia 03/06/2012  . Hypertensive heart disease 03/06/2012  . Nonischemic cardiomyopathy ([place]) 03/06/2012  . Tobacco abuse 03/06/2012  . Type 2 diabetes mellitus ([place]) 03/06/2012  . Hypertension 03/06/2012  . CAD (coronary artery disease), native coronary artery 03/06/2012  . Hypokalemia 03/06/2012  . Acute bronchitis 02/01/2009  . Sleep apnea 02/01/2009  . Chest pain 02/01/2009    Current Outpatient Medications:  .  albuterol (VENTOLIN HFA) 108 (90 Base) MCG/ACT inhaler, Inhale 2 puffs into the lungs every 6 (six) hours as needed for wheezing or shortness of breath., Disp: 18 g, Rfl: 0 .  atorvastatin (LIPITOR) 40 MG tablet, TAKE 1 TABLET(40 MG) BY MOUTH DAILY, Disp: 30 tablet, Rfl: 11 .  baclofen (LIORESAL) 10 MG tablet, , Disp: , Rfl:  .  benzonatate (TESSALON PERLES) 100 MG capsule, Take 2 capsules (200 mg total) by mouth at bedtime. FOR DISRUPTIVE NIGHT TIME COUGH, Disp: 20 capsule, Rfl: 0 .   carvedilol (COREG) 25 MG tablet, TAKE 1 TABLET(25 MG) BY MOUTH TWICE DAILY WITH A MEAL, Disp: 180 tablet, Rfl: 1 .  ELIQUIS 5 MG TABS tablet, TAKE 1 TABLET(5 MG) BY MOUTH TWICE DAILY, Disp: 60 tablet, Rfl: 1 .  furosemide (LASIX) 20 MG tablet, Take 3 tablets (60 mg total) by mouth daily., Disp: 270 tablet, Rfl: 3 .  gabapentin (NEURONTIN) 100 MG capsule, TK 1 C PO TID, Disp: , Rfl:  .  glucose blood (FREESTYLE TEST STRIPS) test strip, Use as instructed, Disp: 100 each, Rfl: 0 .  glucose monitoring kit (FREESTYLE) monitoring kit, 1 each by Does not apply route 4 (four) times daily - after meals and at bedtime. 1 month Diabetic Testing Supplies for QAC-QHS accuchecks., Disp: 1 each, Rfl: 1 .  HUMALOG KWIKPEN 100 UNIT/ML KwikPen, Sliding scale, Disp: , Rfl:  .  insulin glargine (LANTUS) 100 UNIT/ML Solostar Pen, Inject 60 Units into the skin at bedtime., Disp: , Rfl:  .  Insulin Pen Needle 32G X 8 MM MISC, Use as directed, Disp: 100 each, Rfl: 0 .  isosorbide-hydrALAZINE (BIDIL) 20-37.5 MG tablet, Take 2 tablets by mouth 3 (three) times daily., Disp: 180 tablet, Rfl: 6 .  Lancets (FREESTYLE) lancets, Use as instructed, Disp: 100 each, Rfl: 0 .  metFORMIN (GLUCOPHAGE) 1000 MG tablet, Take 1,000 mg by mouth 2 (two) times daily with a meal., Disp: , Rfl:  .  Oxycodone HCl 10 MG TABS, Take 10 mg by mouth 3 (three) times daily as needed., Disp: , Rfl:  .  potassium chloride SA (KLOR-CON) 20 MEQ tablet, TAKE 3  TABLETS(60 MEQ) BY MOUTH DAILY, Disp: 270 tablet, Rfl: 3 .  sacubitril-valsartan (ENTRESTO) 97-103 MG, Take 1 tablet by mouth 2 (two) times daily., Disp: 180 tablet, Rfl: 3 .  spironolactone (ALDACTONE) 25 MG tablet, TAKE 1 TABLET(25 MG) BY MOUTH DAILY, Disp: 90 tablet, Rfl: 3 .  Tafamidis (VYNDAMAX) 61 MG CAPS, Take 61 mg by mouth daily., Disp: 90 capsule, Rfl: 3 .  XIIDRA 5 % SOLN, Place 1 drop into both eyes 2 (two) times daily., Disp: , Rfl:  Allergies  Allergen Reactions  . Morphine And  Related Nausea Only    Severe nausea  . Penicillins Other (See Comments)    Pt states she has had a pain in her leg since a penicillin injection 2 months ago (reported 08/31/17).  Has tolerated amoxicillin oral.  Has patient had a PCN reaction causing immediate rash, facial/tongue/throat swelling, SOB or lightheadedness with hypotension: No Has patient had a PCN reaction causing severe rash involving mucus membranes or skin necrosis: No Has patient had a PCN reaction that required hospitalization: No Has patient had a PCN reaction occurring within the last 10 years: Yes If a     Social History   Socioeconomic History  . Marital status: Single    Spouse name: Not on file  . Number of children: 4  . Years of education: 61  . Highest education level: Not on file  Occupational History  . Not on file  Tobacco Use  . Smoking status: Former Smoker    Packs/day: 1.00    Years: 40.00    Pack years: 40.00    Types: Cigarettes    Start date: 06/23/1972    Quit date: 03/26/2012    Years since quitting: 8.0  . Smokeless tobacco: Never Used  Vaping Use  . Vaping Use: Never used  Substance and Sexual Activity  . Alcohol use: No  . Drug use: No  . Sexual activity: Yes  Other Topics Concern  . Not on file  Social History Narrative  . Not on file   Social Determinants of Health   Financial Resource Strain: Not on file  Food Insecurity: Not on file  Transportation Needs: Not on file  Physical Activity: Not on file  Stress: Not on file  Social Connections: Not on file  Intimate Partner Violence: Not on file    Physical Exam Vitals reviewed.  Constitutional:      Appearance: She is normal weight.  HENT:     Head: Normocephalic.     Nose: Nose normal.     Mouth/Throat:     Mouth: Mucous membranes are moist.     Pharynx: Oropharynx is clear.  Eyes:     Conjunctiva/sclera: Conjunctivae normal.     Pupils: Pupils are equal, round, and reactive to light.  Cardiovascular:      Rate and Rhythm: Normal rate and regular rhythm.     Pulses: Normal pulses.     Heart sounds: Normal heart sounds.  Pulmonary:     Effort: Pulmonary effort is normal.     Breath sounds: Normal breath sounds.  Abdominal:     General: Abdomen is flat.     Palpations: Abdomen is soft.  Musculoskeletal:        General: Normal range of motion.     Cervical back: Normal range of motion.     Right lower leg: No edema.     Left lower leg: No edema.  Skin:    General: Skin is warm and dry.  Capillary Refill: Capillary refill takes less than 2 seconds.  Neurological:     General: No focal deficit present.     Mental Status: She is alert. Mental status is at baseline.  Psychiatric:        Mood and Affect: Mood normal.     Arrived for home visit for [facility] who was alert and oriented reporting to be feeling fine with no complaints. [name] has been compliant with her pill boxes over the last two weeks. I reviewed medications and filled two weeks worth of pill boxes for her. Both boxes are missing Eliquis and Gabapentin as these two medications were two she was supposed to have picked up from pharmacy however she says she doesn't have transportation to do so. I will ensure they are ready at pharmacy and pick them up for her tomorrow. I obtained vitals and her blood sugar was 308 without an insulin or medications. [name] reports she is running low on Lantus and is out of Humalog. I will call these into the pharmacy also. Home visit complete. I will come back tomorrow for med rec. I will see [name] in two more weeks.   Refills: Humalog Lantus Vyndamax       Future Appointments  Date Time Provider [place]  04/18/2020  7:20 AM [facility] DEVICE REMOTES [facility]  05/17/2020  4:00 PM [facility] DEVICE REMOTES [facility]  06/19/2020  7:10 AM [facility] DEVICE REMOTES [facility]  07/16/2020  9:00 AM [facility] PA/NP [facility] None  07/23/2020   7:00 AM [facility] DEVICE REMOTES [facility]  08/16/2020  4:00 PM [facility] DEVICE REMOTES [facility]  09/18/2020  7:10 AM [facility] DEVICE REMOTES [facility]  10/22/2020  7:05 AM [facility] DEVICE REMOTES [facility]  11/15/2020  4:00 PM [facility] DEVICE REMOTES [facility]  12/24/2020  7:15 AM [facility] DEVICE REMOTES [facility]  01/11/2021  9:15 AM Bernarda Caffey, MD [facility] None  02/14/2021  4:00 PM [facility] DEVICE REMOTES [facility]  05/16/2021  4:00 PM [facility] DEVICE REMOTES [facility]  08/15/2021  4:00 PM [facility] DEVICE REMOTES [facility]  11/14/2021  4:00 PM [facility] DEVICE [facility]ES [facility]     ACTION: Home visit completed Next visit planned for two weeks

## 2020-04-11 NOTE — Progress Notes (Signed)
Created in error

## 2020-04-13 ENCOUNTER — Telehealth (HOSPITAL_COMMUNITY): Payer: Self-pay | Admitting: Surgery

## 2020-04-13 NOTE — Telephone Encounter (Signed)
I called and left a message for [name] related to the ordered home sleep study.  I asked that she complete the test within 48 hours and/or call me back with any concerns or questions.

## 2020-04-17 LAB — CUP PACEART REMOTE DEVICE CHECK
Battery Remaining Longevity: 7 mo
Battery Voltage: 2.83 V
Brady Statistic AP VP Percent: 0.04 %
Brady Statistic AP VS Percent: 0.01 %
Brady Statistic AS VP Percent: 98.35 %
Brady Statistic AS VS Percent: 1.6 %
Brady Statistic RA Percent Paced: 0.05 %
Brady Statistic RV Percent Paced: 0.8 %
Date Time Interrogation Session: 20220321174230
HighPow Impedance: 65 Ohm
Implantable Lead Implant Date: 20141023
Implantable Lead Implant Date: 20141023
Implantable Lead Implant Date: 20141023
Implantable Lead Location: 753858
Implantable Lead Location: 753859
Implantable Lead Location: 753860
Implantable Lead Model: 4298
Implantable Lead Model: 5076
Implantable Lead Model: 6935
Implantable Pulse Generator Implant Date: 20141023
Lead Channel Impedance Value: 342 Ohm
Lead Channel Impedance Value: 342 Ohm
Lead Channel Impedance Value: 342 Ohm
Lead Channel Impedance Value: 399 Ohm
Lead Channel Impedance Value: 418 Ohm
Lead Channel Impedance Value: 418 Ohm
Lead Channel Impedance Value: 456 Ohm
Lead Channel Impedance Value: 513 Ohm
Lead Channel Impedance Value: 551 Ohm
Lead Channel Impedance Value: 589 Ohm
Lead Channel Impedance Value: 608 Ohm
Lead Channel Impedance Value: 646 Ohm
Lead Channel Impedance Value: 646 Ohm
Lead Channel Pacing Threshold Amplitude: 0.5 V
Lead Channel Pacing Threshold Amplitude: 0.625 V
Lead Channel Pacing Threshold Amplitude: 1.375 V
Lead Channel Pacing Threshold Pulse Width: 0.4 ms
Lead Channel Pacing Threshold Pulse Width: 0.4 ms
Lead Channel Pacing Threshold Pulse Width: 0.4 ms
Lead Channel Sensing Intrinsic Amplitude: 13.875 mV
Lead Channel Sensing Intrinsic Amplitude: 13.875 mV
Lead Channel Sensing Intrinsic Amplitude: 2.125 mV
Lead Channel Sensing Intrinsic Amplitude: 2.125 mV
Lead Channel Setting Pacing Amplitude: 1.5 V
Lead Channel Setting Pacing Amplitude: 2.5 V
Lead Channel Setting Pacing Amplitude: 2.75 V
Lead Channel Setting Pacing Pulse Width: 0.4 ms
Lead Channel Setting Pacing Pulse Width: 0.4 ms
Lead Channel Setting Sensing Sensitivity: 0.45 mV

## 2020-04-18 ENCOUNTER — Ambulatory Visit (INDEPENDENT_AMBULATORY_CARE_PROVIDER_SITE_OTHER): Payer: Medicare Other

## 2020-04-18 DIAGNOSIS — I5022 Chronic systolic (congestive) heart failure: Secondary | ICD-10-CM

## 2020-04-18 DIAGNOSIS — I428 Other cardiomyopathies: Secondary | ICD-10-CM

## 2020-04-19 MED FILL — VYNDAMAX 61 MG CAPS: 61 | 30 days supply | Qty: 30 | Fill #5

## 2020-04-24 ENCOUNTER — Telehealth (HOSPITAL_COMMUNITY): Payer: Self-pay

## 2020-04-24 ENCOUNTER — Other Ambulatory Visit (HOSPITAL_COMMUNITY): Payer: Self-pay

## 2020-04-24 NOTE — Telephone Encounter (Signed)
Spoke to [name] to schedule home visit for tomorrow, she reports she still has medicine in her pill box and wants me to come out Monday, she states that's when her pill boxes will be empty. I agreed with same and will plan for Monday April 4th home visit.

## 2020-04-27 NOTE — Progress Notes (Signed)
Remote ICD transmission.   

## 2020-05-02 ENCOUNTER — Other Ambulatory Visit (HOSPITAL_COMMUNITY): Payer: Self-pay

## 2020-05-02 NOTE — Progress Notes (Signed)
Paramedicine Encounter    Patient ID: [name], female    DOB: March 24, 1950, 70 y.o.   MRN: [id number]   Patient Care Team: Donald Prose, MD as PCP - General (Family Medicine) Jorge Ny, LCSW as Social Worker (Licensed Clinical Social Worker)  Patient Active Problem List   Diagnosis Date Noted  . Right foot infection 10/22/2017  . Cellulitis in diabetic foot ([place]) 10/22/2017  . Hyperglycemia 10/22/2017  . Lumbar radiculopathy 09/18/2017  . Degenerative spondylolisthesis 09/18/2017  . Atrial fibrillation ([place]) 02/11/2017  . Paroxysmal A-fib ([place])   . Biventricular automatic implantable cardioverter defibrillator in situ   . Sepsis ([place]) 04/20/2016  . UTI (urinary tract infection) 04/20/2016  . Dyspnea 07/19/2015  . Interstitial lung disease ([place]) 11/20/2014  . SIRS (systemic inflammatory response syndrome) ([place]) 02/03/2014  . IDDM (insulin dependent diabetes mellitus) 02/03/2014  . Tachycardia 06/14/2013  . Chronic systolic CHF (congestive heart failure) ([place]) 09/29/2012  . Hypoxia 03/06/2012  . Hypertensive heart disease 03/06/2012  . Nonischemic cardiomyopathy ([place]) 03/06/2012  . Tobacco abuse 03/06/2012  . Type 2 diabetes mellitus ([place]) 03/06/2012  . Hypertension 03/06/2012  . CAD (coronary artery disease), native coronary artery 03/06/2012  . Hypokalemia 03/06/2012  . Acute bronchitis 02/01/2009  . Sleep apnea 02/01/2009  . Chest pain 02/01/2009    Current Outpatient Medications:  .  albuterol (VENTOLIN HFA) 108 (90 Base) MCG/ACT inhaler, Inhale 2 puffs into the lungs every 6 (six) hours as needed for wheezing or shortness of breath., Disp: 18 g, Rfl: 0 .  atorvastatin (LIPITOR) 40 MG tablet, TAKE 1 TABLET(40 MG) BY MOUTH DAILY, Disp: 30 tablet, Rfl: 11 .  baclofen (LIORESAL) 10 MG tablet, , Disp: , Rfl:  .  benzonatate (TESSALON PERLES) 100 MG capsule, Take 2 capsules (200 mg total) by mouth at bedtime. FOR DISRUPTIVE NIGHT TIME COUGH (Patient not taking:  Reported on 04/11/2020), Disp: 20 capsule, Rfl: 0 .  carvedilol (COREG) 25 MG tablet, TAKE 1 TABLET(25 MG) BY MOUTH TWICE DAILY WITH A MEAL, Disp: 180 tablet, Rfl: 1 .  ELIQUIS 5 MG TABS tablet, TAKE 1 TABLET(5 MG) BY MOUTH TWICE DAILY, Disp: 60 tablet, Rfl: 1 .  furosemide (LASIX) 20 MG tablet, Take 3 tablets (60 mg total) by mouth daily., Disp: 270 tablet, Rfl: 3 .  gabapentin (NEURONTIN) 100 MG capsule, TK 1 C PO TID, Disp: , Rfl:  .  glucose blood (FREESTYLE TEST STRIPS) test strip, Use as instructed, Disp: 100 each, Rfl: 0 .  glucose monitoring kit (FREESTYLE) monitoring kit, 1 each by Does not apply route 4 (four) times daily - after meals and at bedtime. 1 month Diabetic Testing Supplies for QAC-QHS accuchecks., Disp: 1 each, Rfl: 1 .  HUMALOG KWIKPEN 100 UNIT/ML KwikPen, Sliding scale, Disp: , Rfl:  .  insulin glargine (LANTUS) 100 UNIT/ML Solostar Pen, Inject 60 Units into the skin at bedtime., Disp: , Rfl:  .  Insulin Pen Needle 32G X 8 MM MISC, Use as directed, Disp: 100 each, Rfl: 0 .  isosorbide-hydrALAZINE (BIDIL) 20-37.5 MG tablet, Take 2 tablets by mouth 3 (three) times daily., Disp: 180 tablet, Rfl: 6 .  Lancets (FREESTYLE) lancets, Use as instructed, Disp: 100 each, Rfl: 0 .  metFORMIN (GLUCOPHAGE) 1000 MG tablet, Take 1,000 mg by mouth 2 (two) times daily with a meal., Disp: , Rfl:  .  Oxycodone HCl 10 MG TABS, Take 10 mg by mouth 3 (three) times daily as needed., Disp: , Rfl:  .  potassium chloride SA (  KLOR-CON) 20 MEQ tablet, TAKE 3 TABLETS(60 MEQ) BY MOUTH DAILY, Disp: 270 tablet, Rfl: 3 .  sacubitril-valsartan (ENTRESTO) 97-103 MG, Take 1 tablet by mouth 2 (two) times daily., Disp: 180 tablet, Rfl: 3 .  spironolactone (ALDACTONE) 25 MG tablet, TAKE 1 TABLET(25 MG) BY MOUTH DAILY, Disp: 90 tablet, Rfl: 3 .  Tafamidis 61 MG CAPS, TAKE 1 CAPSULE (61 MG) BY MOUTH DAILY., Disp: 90 capsule, Rfl: 3 .  XIIDRA 5 % SOLN, Place 1 drop into both eyes 2 (two) times daily., Disp: , Rfl:   Allergies  Allergen Reactions  . Morphine And Related Nausea Only    Severe nausea  . Penicillins Other (See Comments)    Pt states she has had a pain in her leg since a penicillin injection 2 months ago (reported 08/31/17).  Has tolerated amoxicillin oral.  Has patient had a PCN reaction causing immediate rash, facial/tongue/throat swelling, SOB or lightheadedness with hypotension: No Has patient had a PCN reaction causing severe rash involving mucus membranes or skin necrosis: No Has patient had a PCN reaction that required hospitalization: No Has patient had a PCN reaction occurring within the last 10 years: Yes If a     Social History   Socioeconomic History  . Marital status: Single    Spouse name: Not on file  . Number of children: 4  . Years of education: 3  . Highest education level: Not on file  Occupational History  . Not on file  Tobacco Use  . Smoking status: Former Smoker    Packs/day: 1.00    Years: 40.00    Pack years: 40.00    Types: Cigarettes    Start date: 06/23/1972    Quit date: 03/26/2012    Years since quitting: 8.1  . Smokeless tobacco: Never Used  Vaping Use  . Vaping Use: Never used  Substance and Sexual Activity  . Alcohol use: No  . Drug use: No  . Sexual activity: Yes  Other Topics Concern  . Not on file  Social History Narrative  . Not on file   Social Determinants of Health   Financial Resource Strain: Not on file  Food Insecurity: Not on file  Transportation Needs: Not on file  Physical Activity: Not on file  Stress: Not on file  Social Connections: Not on file  Intimate Partner Violence: Not on file    Physical Exam Vitals reviewed.  Constitutional:      Appearance: Normal appearance. She is normal weight.  HENT:     Head: Normocephalic.     Nose: Nose normal.     Mouth/Throat:     Mouth: Mucous membranes are moist.     Pharynx: Oropharynx is clear.  Eyes:     Conjunctiva/sclera: Conjunctivae normal.     Pupils:  Pupils are equal, round, and reactive to light.  Cardiovascular:     Rate and Rhythm: Normal rate and regular rhythm.     Pulses: Normal pulses.  Pulmonary:     Effort: Pulmonary effort is normal.     Breath sounds: Normal breath sounds.  Abdominal:     General: Abdomen is flat.     Palpations: Abdomen is soft.  Musculoskeletal:        General: Swelling present. Normal range of motion.     Cervical back: Normal range of motion.     Right lower leg: Edema present.     Left lower leg: No edema.  Skin:    General: Skin is warm and  dry.     Capillary Refill: Capillary refill takes less than 2 seconds.  Neurological:     General: No focal deficit present.     Mental Status: She is alert. Mental status is at baseline.  Psychiatric:        Mood and Affect: Mood normal.     Arrived for home visit for [facility] who was alert and oriented seated on the couch reporting she was feeling fine with no chest pain, dizziness or shortness of breath. Yesica just reports feeling drowsy often and having some right foot swelling. Some mild edema noted to the top of her right foot. Maliya has been compliant with her medications. Pill boxes empty. Sharla has missed a few doses of her insulin and does not have her Humulin at the house. I have called the prescriber to send it and [facility] reports they do not have it. I will continue to work on this. I obtained vitals and assessment as noted.   CBG- 504!!! (NO MEDS)  I educated Jamaiyah on diabetic management and dietary restrictions. She verbalized understanding.   I reviewed medications and filling two pill boxes. #2 box needing Vyndamax Friday and Saturday. And Bidil Friday and Saturday. I will return next week to pick and place same.   Marlyss self administered 60units of her Lantus during our visit and took her morning medications. I plan to see Anthonette in one week. She was instructed to call me to report her sugar in two hours and I did not receive a  call.    Home visit complete.   Refills:  Bidil Vyndamax Humulin     Future Appointments  Date Time Provider [place]  05/17/2020  4:00 PM [facility] DEVICE REMOTES [facility]  06/19/2020  7:10 AM [facility] DEVICE REMOTES [facility]  07/16/2020  9:00 AM [facility] PA/NP [facility] None  07/23/2020  7:00 AM [facility] DEVICE REMOTES [facility]  08/16/2020  4:00 PM [facility] DEVICE REMOTES [facility]  09/18/2020  7:10 AM [facility] DEVICE REMOTES [facility]  10/22/2020  7:05 AM [facility] DEVICE REMOTES [facility]  11/15/2020  4:00 PM [facility] DEVICE REMOTES [facility]  12/24/2020  7:15 AM [facility] DEVICE REMOTES [facility]  01/11/2021  9:15 AM Bernarda Caffey, MD [facility] None  02/14/2021  4:00 PM [facility] DEVICE REMOTES [facility]  05/16/2021  4:00 PM [facility] DEVICE REMOTES [facility]  08/15/2021  4:00 PM [facility] DEVICE REMOTES [facility]  11/14/2021  4:00 PM [facility] DEVICE REMOTES [facility]     ACTION: Home visit completed Next visit planned for one week

## 2020-05-04 ENCOUNTER — Other Ambulatory Visit (HOSPITAL_COMMUNITY): Payer: Self-pay

## 2020-05-09 ENCOUNTER — Other Ambulatory Visit (HOSPITAL_COMMUNITY): Payer: Self-pay

## 2020-05-09 NOTE — Progress Notes (Signed)
Paramedicine Encounter    Patient ID: [name], female    DOB: 13-Jul-1950, 70 y.o.   MRN: [id number]  Completed medication verification and pill box fill today. 2 pill boxes filled successfully. I will see [name] in two weeks.   Refills: Vyndamax (called in last week, and called in again today)  [name] was left a note of where Vyndamax goes once she receives it.   ACTION: Home visit completed Next visit planned for two weeks

## 2020-05-10 ENCOUNTER — Other Ambulatory Visit (HOSPITAL_COMMUNITY): Payer: Self-pay

## 2020-05-10 MED FILL — Tafamidis Cap 61 MG: ORAL | 30 days supply | Qty: 30 | Fill #0 | Status: AC

## 2020-05-14 ENCOUNTER — Other Ambulatory Visit (HOSPITAL_COMMUNITY): Payer: Self-pay

## 2020-05-16 ENCOUNTER — Emergency Department (HOSPITAL_COMMUNITY)
Admission: EM | Admit: 2020-05-16 | Discharge: 2020-05-16 | Disposition: A | Discharge status: Home / Self Care | Payer: Medicare Other | Attending: Emergency Medicine | Admitting: Emergency Medicine

## 2020-05-16 ENCOUNTER — Encounter (HOSPITAL_COMMUNITY): Payer: Self-pay | Admitting: Emergency Medicine

## 2020-05-16 ENCOUNTER — Other Ambulatory Visit: Payer: Self-pay

## 2020-05-16 DIAGNOSIS — R197 Diarrhea, unspecified: Secondary | ICD-10-CM | POA: Diagnosis not present

## 2020-05-16 DIAGNOSIS — Z96653 Presence of artificial knee joint, bilateral: Secondary | ICD-10-CM | POA: Insufficient documentation

## 2020-05-16 DIAGNOSIS — R6883 Chills (without fever): Secondary | ICD-10-CM | POA: Diagnosis not present

## 2020-05-16 DIAGNOSIS — Z87891 Personal history of nicotine dependence: Secondary | ICD-10-CM | POA: Insufficient documentation

## 2020-05-16 DIAGNOSIS — Z794 Long term (current) use of insulin: Secondary | ICD-10-CM | POA: Diagnosis not present

## 2020-05-16 DIAGNOSIS — Z96643 Presence of artificial hip joint, bilateral: Secondary | ICD-10-CM | POA: Diagnosis not present

## 2020-05-16 DIAGNOSIS — I480 Paroxysmal atrial fibrillation: Secondary | ICD-10-CM | POA: Diagnosis not present

## 2020-05-16 DIAGNOSIS — Z7984 Long term (current) use of oral hypoglycemic drugs: Secondary | ICD-10-CM | POA: Diagnosis not present

## 2020-05-16 DIAGNOSIS — Z7901 Long term (current) use of anticoagulants: Secondary | ICD-10-CM | POA: Diagnosis not present

## 2020-05-16 DIAGNOSIS — R10817 Generalized abdominal tenderness: Secondary | ICD-10-CM | POA: Insufficient documentation

## 2020-05-16 DIAGNOSIS — I5042 Chronic combined systolic (congestive) and diastolic (congestive) heart failure: Secondary | ICD-10-CM | POA: Diagnosis not present

## 2020-05-16 DIAGNOSIS — E669 Obesity, unspecified: Secondary | ICD-10-CM | POA: Insufficient documentation

## 2020-05-16 DIAGNOSIS — E119 Type 2 diabetes mellitus without complications: Secondary | ICD-10-CM | POA: Diagnosis not present

## 2020-05-16 DIAGNOSIS — I110 Hypertensive heart disease with heart failure: Secondary | ICD-10-CM | POA: Insufficient documentation

## 2020-05-16 DIAGNOSIS — R031 Nonspecific low blood-pressure reading: Secondary | ICD-10-CM | POA: Diagnosis not present

## 2020-05-16 DIAGNOSIS — R112 Nausea with vomiting, unspecified: Secondary | ICD-10-CM | POA: Insufficient documentation

## 2020-05-16 DIAGNOSIS — I2510 Atherosclerotic heart disease of native coronary artery without angina pectoris: Secondary | ICD-10-CM | POA: Diagnosis not present

## 2020-05-16 LAB — COMPREHENSIVE METABOLIC PANEL
ALT: 11 U/L (ref 0–44)
AST: 15 U/L (ref 15–41)
Albumin: 3.3 g/dL — ABNORMAL LOW (ref 3.5–5.0)
Alkaline Phosphatase: 87 U/L (ref 38–126)
Anion gap: 8 (ref 5–15)
BUN: 16 mg/dL (ref 8–23)
CO2: 26 mmol/L (ref 22–32)
Calcium: 8.9 mg/dL (ref 8.9–10.3)
Chloride: 104 mmol/L (ref 98–111)
Creatinine, Ser: 1.11 mg/dL — ABNORMAL HIGH (ref 0.44–1.00)
GFR, Estimated: 54 mL/min — ABNORMAL LOW (ref >60)
Glucose, Bld: 161 mg/dL — ABNORMAL HIGH (ref 70–99)
Potassium: 3.6 mmol/L (ref 3.5–5.1)
Sodium: 138 mmol/L (ref 135–145)
Total Bilirubin: 0.8 mg/dL (ref 0.3–1.2)
Total Protein: 6.4 g/dL — ABNORMAL LOW (ref 6.5–8.1)

## 2020-05-16 LAB — CBC WITH DIFFERENTIAL/PLATELET
Abs Immature Granulocytes: 0.04 10*3/uL (ref 0.00–0.07)
Basophils Absolute: 0 10*3/uL (ref 0.0–0.1)
Basophils Relative: 0 %
Eosinophils Absolute: 0.1 10*3/uL (ref 0.0–0.5)
Eosinophils Relative: 1 %
HCT: 35.3 % — ABNORMAL LOW (ref 36.0–46.0)
Hemoglobin: 11.7 g/dL — ABNORMAL LOW (ref 12.0–15.0)
Immature Granulocytes: 1 %
Lymphocytes Relative: 13 %
Lymphs Abs: 0.9 10*3/uL (ref 0.7–4.0)
MCH: 30.3 pg (ref 26.0–34.0)
MCHC: 33.1 g/dL (ref 30.0–36.0)
MCV: 91.5 fL (ref 80.0–100.0)
Monocytes Absolute: 0.5 10*3/uL (ref 0.1–1.0)
Monocytes Relative: 8 %
Neutro Abs: 5.5 10*3/uL (ref 1.7–7.7)
Neutrophils Relative %: 77 %
Platelets: 305 10*3/uL (ref 150–400)
RBC: 3.86 MIL/uL — ABNORMAL LOW (ref 3.87–5.11)
RDW: 12.5 % (ref 11.5–15.5)
WBC: 7.1 10*3/uL (ref 4.0–10.5)
nRBC: 0 % (ref 0.0–0.2)

## 2020-05-16 LAB — LIPASE, BLOOD: Lipase: 25 U/L (ref 11–51)

## 2020-05-16 MED ORDER — SODIUM CHLORIDE 0.9 % IV SOLN
1000.0000 mL | Freq: Once | INTRAVENOUS | Status: AC
  Administered 2020-05-16: 1000 mL via INTRAVENOUS

## 2020-05-16 MED ORDER — ONDANSETRON HCL 4 MG/2ML IJ SOLN
4.0000 mg | Freq: Once | INTRAMUSCULAR | Status: AC
  Administered 2020-05-16: 4 mg via INTRAVENOUS
  Filled 2020-05-16: qty 2

## 2020-05-16 NOTE — ED Notes (Signed)
Pt reporting no way of getting home, social worker informed

## 2020-05-16 NOTE — ED Triage Notes (Signed)
Pt BIB [facility] from home, c/o diarrhea, abdominal pain and dizziness that started last night after eating fast food. EMS found pt to be hypotensive, BP 80/40, given 500ccNS with improvement. Nausea started on arrival to ED, no vomiting at this time. Hx diabetes, CBG 155

## 2020-05-16 NOTE — ED Notes (Signed)
Patient verbalized understanding of discharge instructions. Opportunity for questions and answers.  

## 2020-05-16 NOTE — ED Notes (Signed)
CSW provided pt with [employer]

## 2020-05-16 NOTE — ED Provider Notes (Signed)
[facility] EMERGENCY DEPARTMENT Provider Note   CSN: [id number] Arrival date & time: 05/16/20  1610     History Chief Complaint  Patient presents with  . Hypotension    [name] is a 70 y.o. female.  The history is provided by the patient and medical records. No language interpreter was used.     70 year old female significant history of atrial fibrillation currently on Eliquis, CHF, CAD, hypertension, diabetes brought here via EMS from home with complaints of nausea vomiting diarrhea.  Patient states as she went with her family to [facility] for vacation for the past several days.  Last night on her way home she ate [employer].  After that she developed nausea, vomited a few times but has had persistent diarrhea since.  Diarrhea is nonbloody nonbilious but has been ongoing with some improvement.  Some mild abdominal discomfort only.  Some chills but no fever.  No chest pain shortness of breath runny nose sneezing coughing dysuria.  No blood in her vomit.  Denies any recent antibiotic use.  She is fully up-to-date with her COVID vaccination.  EMS did note the patient was hypotensive with a blood pressure of 80/40 and did receive 500 cc of normal saline on route with improvement.  Her recent CBG is 155.  Past Medical History:  Diagnosis Date  . Anemia    a. Noted on 07/2012 labs, instructed to f/u PCP.  Marland Kitchen Arthritis    "joints" (11/18/2012)  . CAD (coronary artery disease), native coronary artery    a. Nonobstructive by cath 02/2012 (done because of low EF).  . Chronic bronchitis ([place])    "~ every other year" (11/18/2012)  . Chronic combined systolic and diastolic CHF (congestive heart failure) ([place])    a. 03/05/12 echo:  LVEF 20-25%, moderate LVH , inferior and basal to mid septal akinesis, anterior moderate to severe hypokinesis and grade 2 diastolic dysfunction. b. EF 07/2012: EF still 25% (unclear medication compliance).  . Chronic lower back pain   .  Headache(784.0)    "often; maybe not daily" (11/18/2012)  . High cholesterol   . History of noncompliance with medical treatment   . Hypertension   . LBBB (left bundle branch block)   . Orthopnea   . Tobacco abuse   . Type II diabetes mellitus [facility])     Patient Active Problem List   Diagnosis Date Noted  . Right foot infection 10/22/2017  . Cellulitis in diabetic foot ([place]) 10/22/2017  . Hyperglycemia 10/22/2017  . Lumbar radiculopathy 09/18/2017  . Degenerative spondylolisthesis 09/18/2017  . Atrial fibrillation ([place]) 02/11/2017  . Paroxysmal A-fib ([place])   . Biventricular automatic implantable cardioverter defibrillator in situ   . Sepsis ([place]) 04/20/2016  . UTI (urinary tract infection) 04/20/2016  . Dyspnea 07/19/2015  . Interstitial lung disease ([place]) 11/20/2014  . SIRS (systemic inflammatory response syndrome) ([place]) 02/03/2014  . IDDM (insulin dependent diabetes mellitus) 02/03/2014  . Tachycardia 06/14/2013  . Chronic systolic CHF (congestive heart failure) ([place]) 09/29/2012  . Hypoxia 03/06/2012  . Hypertensive heart disease 03/06/2012  . Nonischemic cardiomyopathy ([place]) 03/06/2012  . Tobacco abuse 03/06/2012  . Type 2 diabetes mellitus ([place]) 03/06/2012  . Hypertension 03/06/2012  . CAD (coronary artery disease), native coronary artery 03/06/2012  . Hypokalemia 03/06/2012  . Acute bronchitis 02/01/2009  . Sleep apnea 02/01/2009  . Chest pain 02/01/2009    Past Surgical History:  Procedure Laterality Date  . BI-VENTRICULAR IMPLANTABLE CARDIOVERTER DEFIBRILLATOR N/A 11/18/2012   Procedure:  BI-VENTRICULAR IMPLANTABLE CARDIOVERTER DEFIBRILLATOR  (CRT-D);  Surgeon: Evans Lance, MD;  Location: [facility] CATH LAB;  Service: Cardiovascular;  Laterality: N/A;  . BI-VENTRICULAR IMPLANTABLE CARDIOVERTER DEFIBRILLATOR  (CRT-D)  11/18/2012  . CARDIAC CATHETERIZATION  03/04/12   nonobstructive CAD, elevated LVEDP and tortuous vessels suggestive of long-standing hypertension  .  COLONOSCOPY WITH PROPOFOL N/A 12/12/2016   Procedure: COLONOSCOPY WITH PROPOFOL;  Surgeon: Carol Ada, MD;  Location: [facility] ENDOSCOPY;  Service: Endoscopy;  Laterality: N/A;  . JOINT REPLACEMENT     Bilateral hip and right knee  . LEFT HEART CATH N/A 03/05/2012   Procedure: LEFT HEART CATH;  Surgeon: Larey Dresser, MD;  Location: [facility] CATH LAB;  Service: Cardiovascular;  Laterality: N/A;     OB History   No obstetric history on file.     Family History  Problem Relation Age of Onset  . Heart failure Mother   . Heart disease Neg Hx     Social History   Tobacco Use  . Smoking status: Former Smoker    Packs/day: 1.00    Years: 40.00    Pack years: 40.00    Types: Cigarettes    Start date: 06/23/1972    Quit date: 03/26/2012    Years since quitting: 8.1  . Smokeless tobacco: Never Used  Vaping Use  . Vaping Use: Never used  Substance Use Topics  . Alcohol use: No  . Drug use: No    Home Medications Prior to Admission medications   Medication Sig Start Date End Date Taking? Authorizing Provider  albuterol (VENTOLIN HFA) 108 (90 Base) MCG/ACT inhaler Inhale 2 puffs into the lungs every 6 (six) hours as needed for wheezing or shortness of breath. 02/06/20   Kinnie Feil, PA-C  atorvastatin (LIPITOR) 40 MG tablet TAKE 1 TABLET(40 MG) BY MOUTH DAILY 04/26/19   Larey Dresser, MD  baclofen (LIORESAL) 10 MG tablet  02/08/18   [provider]  benzonatate (TESSALON PERLES) 100 MG capsule Take 2 capsules (200 mg total) by mouth at bedtime. FOR DISRUPTIVE NIGHT TIME COUGH Patient not taking: No sig reported 02/05/20   Kinnie Feil, PA-C  carvedilol (COREG) 25 MG tablet TAKE 1 TABLET(25 MG) BY MOUTH TWICE DAILY WITH A MEAL 10/28/19   Larey Dresser, MD  ELIQUIS 5 MG TABS tablet TAKE 1 TABLET(5 MG) BY MOUTH TWICE DAILY 08/16/19   Larey Dresser, MD  furosemide (LASIX) 20 MG tablet Take 3 tablets (60 mg total) by mouth daily. 03/15/20   Larey Dresser, MD  gabapentin  (NEURONTIN) 100 MG capsule TK 1 C PO TID 03/10/18   [provider]  glucose blood (FREESTYLE TEST STRIPS) test strip Use as instructed Patient not taking: Reported on 05/09/2020 04/25/16   Jonetta Osgood, MD  glucose monitoring kit (FREESTYLE) monitoring kit 1 each by Does not apply route 4 (four) times daily - after meals and at bedtime. 1 month Diabetic Testing Supplies for QAC-QHS accuchecks. Patient not taking: No sig reported 04/25/16   Jonetta Osgood, MD  HUMALOG KWIKPEN 100 UNIT/ML KwikPen Sliding scale 02/12/18   [provider]  insulin glargine (LANTUS) 100 UNIT/ML Solostar Pen Inject 60 Units into the skin at bedtime.    [provider]  Insulin Pen Needle 32G X 8 MM MISC Use as directed 06/18/16   Arbutus Leas, NP  isosorbide-hydrALAZINE (BIDIL) 20-37.5 MG tablet Take 2 tablets by mouth 3 (three) times daily. 02/23/20   Larey Dresser, MD  Lancets (FREESTYLE) lancets Use as instructed Patient not taking: No sig reported 06/18/16   Arbutus Leas, NP  metFORMIN (GLUCOPHAGE) 1000 MG tablet Take 1,000 mg by mouth 2 (two) times daily with a meal.    [provider]  Oxycodone HCl 10 MG TABS Take 10 mg by mouth 3 (three) times daily as needed. 01/09/20   [provider]  potassium chloride SA (KLOR-CON) 20 MEQ tablet TAKE 3 TABLETS(60 MEQ) BY MOUTH DAILY 11/09/19   Larey Dresser, MD  sacubitril-valsartan (ENTRESTO) 97-103 MG Take 1 tablet by mouth 2 (two) times daily. 08/16/19   Larey Dresser, MD  spironolactone (ALDACTONE) 25 MG tablet TAKE 1 TABLET(25 MG) BY MOUTH DAILY 10/28/19   Larey Dresser, MD  Tafamidis 61 MG CAPS TAKE 1 CAPSULE (61 MG) BY MOUTH DAILY. 09/30/19 09/29/20  Larey Dresser, MD  XIIDRA 5 % SOLN Place 1 drop into both eyes 2 (two) times daily. 08/17/19   [provider]    Allergies    Morphine and related and Penicillins  Review of Systems   Review of Systems  All other systems reviewed and are  negative.   Physical Exam Updated Vital Signs BP (!) 92/51 (BP Location: Right Arm)   Pulse 80   Temp 98.5 F (36.9 C) (Oral)   Resp 19   SpO2 99%   Physical Exam Vitals and nursing note reviewed.  Constitutional:      General: She is not in acute distress.    Appearance: She is well-developed. She is obese.  HENT:     Head: Atraumatic.  Eyes:     Conjunctiva/sclera: Conjunctivae normal.  Cardiovascular:     Rate and Rhythm: Normal rate and regular rhythm.     Pulses: Normal pulses.     Heart sounds: Normal heart sounds.  Pulmonary:     Effort: Pulmonary effort is normal.     Breath sounds: Normal breath sounds. No wheezing, rhonchi or rales.  Abdominal:     General: Abdomen is flat.     Tenderness: There is abdominal tenderness (Mild generalized abdominal tenderness without guarding or rebound tenderness.).  Musculoskeletal:     Cervical back: Neck supple.  Skin:    Findings: No rash.  Neurological:     Mental Status: She is alert. Mental status is at baseline.  Psychiatric:        Mood and Affect: Mood normal.     ED Results / Procedures / Treatments   Labs (all labs ordered are listed, but only abnormal results are displayed) Labs Reviewed  CBC WITH DIFFERENTIAL/PLATELET - Abnormal; Notable for the following components:      Result Value   RBC 3.86 (*)    Hemoglobin 11.7 (*)    HCT 35.3 (*)    All other components within normal limits  COMPREHENSIVE METABOLIC PANEL - Abnormal; Notable for the following components:   Glucose, Bld 161 (*)    Creatinine, Ser 1.11 (*)    Total Protein 6.4 (*)    Albumin 3.3 (*)    GFR, Estimated 54 (*)    All other components within normal limits  LIPASE, BLOOD    EKG None  Radiology No results found.  Procedures Procedures   Medications Ordered in ED Medications  0.9 %  sodium chloride infusion (1,000 mLs Intravenous New Bag/Given 05/16/20 1709)  ondansetron (ZOFRAN) injection 4 mg (4 mg Intravenous Given  05/16/20 1708)    ED Course  I have reviewed the triage vital  signs and the nursing notes.  Pertinent labs & imaging results that were available during my care of the patient were reviewed by me and considered in my medical decision making (see chart for details).  Clinical Course as of 05/17/20 2332  Wed May 17, 3478  377 70 year old female here with nausea vomiting diarrhea and low blood pressure.  Blood pressure improving after some fluids.  Getting some screening labs.  Likely discharge if no acute findings. [MB]    Clinical Course User Index [MB] Hayden Rasmussen, MD   MDM Rules/Calculators/A&P                          BP 121/68   Pulse 80   Temp 98.2 F (36.8 C) (Oral)   Resp 13   SpO2 95%   Final Clinical Impression(s) / ED Diagnoses Final diagnoses:  Nausea vomiting and diarrhea    Rx / DC Orders ED Discharge Orders    None     4:25 PM Patient developed nausea vomiting diarrhea shortly after eating [employer] last night.  Symptoms suggestive of likely viral gastroenteritis given her presentation.  She has some mild abdominal tenderness only.  Given her age, will give IV fluid, check labs, and will reassess.  May consider abdominal pelvis CT scan if symptoms persist.  7:10 PM Labs are reassuring, mild AKI with creatinine of 1.11, patient received IV fluid.  At this time she reports desire to eat and drink.  She tolerates p.o.  She is stable for discharge.  Her blood pressure has improved.   Domenic Moras, PA-C 05/17/20 2332    Hayden Rasmussen, MD 05/18/20 1017

## 2020-05-16 NOTE — Social Work (Signed)
CSW coordinated transportation through [employer]

## 2020-05-16 NOTE — Telephone Encounter (Signed)
Attempted to follow up with patient regarding [employer] sleep study no answer received on either mobile or home number.

## 2020-05-16 NOTE — Discharge Instructions (Signed)
Your symptoms are likely due to food poisoning.  Stay hydrated, eat bland food in the meantime, follow-up with your doctor as needed.  Return if you have any concern.

## 2020-05-17 ENCOUNTER — Ambulatory Visit (INDEPENDENT_AMBULATORY_CARE_PROVIDER_SITE_OTHER): Payer: Medicare Other

## 2020-05-17 DIAGNOSIS — I428 Other cardiomyopathies: Secondary | ICD-10-CM | POA: Diagnosis not present

## 2020-05-18 ENCOUNTER — Emergency Department (HOSPITAL_COMMUNITY): Payer: Medicare Other

## 2020-05-18 ENCOUNTER — Emergency Department (HOSPITAL_COMMUNITY)
Admission: EM | Admit: 2020-05-18 | Discharge: 2020-05-18 | Disposition: A | Discharge status: Home / Self Care | Payer: Medicare Other | Attending: Emergency Medicine | Admitting: Emergency Medicine

## 2020-05-18 DIAGNOSIS — I2510 Atherosclerotic heart disease of native coronary artery without angina pectoris: Secondary | ICD-10-CM | POA: Diagnosis not present

## 2020-05-18 DIAGNOSIS — I5042 Chronic combined systolic (congestive) and diastolic (congestive) heart failure: Secondary | ICD-10-CM | POA: Diagnosis not present

## 2020-05-18 DIAGNOSIS — I480 Paroxysmal atrial fibrillation: Secondary | ICD-10-CM | POA: Insufficient documentation

## 2020-05-18 DIAGNOSIS — K921 Melena: Secondary | ICD-10-CM | POA: Diagnosis not present

## 2020-05-18 DIAGNOSIS — Z7984 Long term (current) use of oral hypoglycemic drugs: Secondary | ICD-10-CM | POA: Insufficient documentation

## 2020-05-18 DIAGNOSIS — Z7901 Long term (current) use of anticoagulants: Secondary | ICD-10-CM | POA: Diagnosis not present

## 2020-05-18 DIAGNOSIS — Z87891 Personal history of nicotine dependence: Secondary | ICD-10-CM | POA: Diagnosis not present

## 2020-05-18 DIAGNOSIS — R197 Diarrhea, unspecified: Secondary | ICD-10-CM

## 2020-05-18 DIAGNOSIS — Z96643 Presence of artificial hip joint, bilateral: Secondary | ICD-10-CM | POA: Diagnosis not present

## 2020-05-18 DIAGNOSIS — Z96651 Presence of right artificial knee joint: Secondary | ICD-10-CM | POA: Insufficient documentation

## 2020-05-18 DIAGNOSIS — R110 Nausea: Secondary | ICD-10-CM | POA: Insufficient documentation

## 2020-05-18 DIAGNOSIS — I110 Hypertensive heart disease with heart failure: Secondary | ICD-10-CM | POA: Insufficient documentation

## 2020-05-18 DIAGNOSIS — Z794 Long term (current) use of insulin: Secondary | ICD-10-CM | POA: Insufficient documentation

## 2020-05-18 DIAGNOSIS — E119 Type 2 diabetes mellitus without complications: Secondary | ICD-10-CM | POA: Insufficient documentation

## 2020-05-18 DIAGNOSIS — Z79899 Other long term (current) drug therapy: Secondary | ICD-10-CM | POA: Insufficient documentation

## 2020-05-18 LAB — CBC WITH DIFFERENTIAL/PLATELET
Abs Immature Granulocytes: 0.01 10*3/uL (ref 0.00–0.07)
Basophils Absolute: 0 10*3/uL (ref 0.0–0.1)
Basophils Relative: 0 %
Eosinophils Absolute: 0.1 10*3/uL (ref 0.0–0.5)
Eosinophils Relative: 2 %
HCT: 31.1 % — ABNORMAL LOW (ref 36.0–46.0)
Hemoglobin: 10.2 g/dL — ABNORMAL LOW (ref 12.0–15.0)
Immature Granulocytes: 0 %
Lymphocytes Relative: 32 %
Lymphs Abs: 1.8 10*3/uL (ref 0.7–4.0)
MCH: 30.5 pg (ref 26.0–34.0)
MCHC: 32.8 g/dL (ref 30.0–36.0)
MCV: 93.1 fL (ref 80.0–100.0)
Monocytes Absolute: 1 10*3/uL (ref 0.1–1.0)
Monocytes Relative: 18 %
Neutro Abs: 2.6 10*3/uL (ref 1.7–7.7)
Neutrophils Relative %: 48 %
Platelets: 272 10*3/uL (ref 150–400)
RBC: 3.34 MIL/uL — ABNORMAL LOW (ref 3.87–5.11)
RDW: 12.7 % (ref 11.5–15.5)
WBC: 5.5 10*3/uL (ref 4.0–10.5)
nRBC: 0 % (ref 0.0–0.2)

## 2020-05-18 LAB — TYPE AND SCREEN
ABO/RH(D): A POS
Antibody Screen: NEGATIVE

## 2020-05-18 LAB — COMPREHENSIVE METABOLIC PANEL
ALT: 18 U/L (ref 0–44)
AST: 28 U/L (ref 15–41)
Albumin: 3.2 g/dL — ABNORMAL LOW (ref 3.5–5.0)
Alkaline Phosphatase: 74 U/L (ref 38–126)
Anion gap: 9 (ref 5–15)
BUN: 17 mg/dL (ref 8–23)
CO2: 20 mmol/L — ABNORMAL LOW (ref 22–32)
Calcium: 8.1 mg/dL — ABNORMAL LOW (ref 8.9–10.3)
Chloride: 110 mmol/L (ref 98–111)
Creatinine, Ser: 1.01 mg/dL — ABNORMAL HIGH (ref 0.44–1.00)
GFR, Estimated: >60 mL/min (ref >60)
Glucose, Bld: 84 mg/dL (ref 70–99)
Potassium: 3.1 mmol/L — ABNORMAL LOW (ref 3.5–5.1)
Sodium: 139 mmol/L (ref 135–145)
Total Bilirubin: 0.4 mg/dL (ref 0.3–1.2)
Total Protein: 6.5 g/dL (ref 6.5–8.1)

## 2020-05-18 LAB — OCCULT BLOOD X 1 CARD TO LAB, STOOL: Fecal Occult Bld: NEGATIVE

## 2020-05-18 LAB — LIPASE, BLOOD: Lipase: 22 U/L (ref 11–51)

## 2020-05-18 MED ORDER — IOHEXOL 300 MG/ML  SOLN
100.0000 mL | Freq: Once | INTRAMUSCULAR | Status: AC | PRN
  Administered 2020-05-18: 100 mL via INTRAVENOUS

## 2020-05-18 MED ORDER — ONDANSETRON HCL 4 MG PO TABS: 4.0000 mg | ORAL_TABLET | Freq: Four times a day (QID) | ORAL | 0 refills | Status: DC

## 2020-05-18 NOTE — Discharge Instructions (Signed)
Please return for any problem.  °

## 2020-05-18 NOTE — ED Triage Notes (Signed)
Nausea; gi upset and diarrhea since Tuesday- grandson also has n/v. Pt tearful; states she noticed her stool was black today

## 2020-05-18 NOTE — ED Provider Notes (Signed)
[name]   CSN: [id number] Arrival date & time: 05/18/20  1808     History Chief Complaint  Patient presents with  . Diarrhea    [name] is a 70 y.o. female.  70 year old female with prior medical history as detailed below presents for evaluation.  Patient reports onset of nausea with associated diarrhea over the weekend.  She was seen earlier this week for same complaint.  Patient reports that she felt better over the last 24 hours.  She then noticed some darker colored stool and was concerned that she may have internal bleeding.  She denies vomiting.  She denies vomiting of blood.  She denies gross blood in her stool.  She denies melena.  She reports darker colored looser stool over the last 12 to 24 hours.  She denies prior history of GI bleed.  The history is provided by the patient and medical records.  Diarrhea Diarrhea characteristics: dark stool. Severity:  Mild Onset quality:  Gradual Duration:  3 days Timing:  Constant Progression:  Worsening Relieved by:  Nothing Worsened by:  Nothing      Past Medical History:  Diagnosis Date  . Anemia    a. Noted on 07/2012 labs, instructed to f/u PCP.  Marland Kitchen Arthritis    "joints" (11/18/2012)  . CAD (coronary artery disease), native coronary artery    a. Nonobstructive by cath 02/2012 (done because of low EF).  . Chronic bronchitis ([place])    "~ every other year" (11/18/2012)  . Chronic combined systolic and diastolic CHF (congestive heart failure) ([place])    a. 03/05/12 echo:  LVEF 20-25%, moderate LVH , inferior and basal to mid septal akinesis, anterior moderate to severe hypokinesis and grade 2 diastolic dysfunction. b. EF 07/2012: EF still 25% (unclear medication compliance).  . Chronic lower back pain   . Headache(784.0)    "often; maybe not daily" (11/18/2012)  . High cholesterol   . History of noncompliance with medical treatment   . Hypertension   . LBBB (left bundle  branch block)   . Orthopnea   . Tobacco abuse   . Type II diabetes mellitus [facility])     Patient Active Problem List   Diagnosis Date Noted  . Right foot infection 10/22/2017  . Cellulitis in diabetic foot ([place]) 10/22/2017  . Hyperglycemia 10/22/2017  . Lumbar radiculopathy 09/18/2017  . Degenerative spondylolisthesis 09/18/2017  . Atrial fibrillation ([place]) 02/11/2017  . Paroxysmal A-fib ([place])   . Biventricular automatic implantable cardioverter defibrillator in situ   . Sepsis ([place]) 04/20/2016  . UTI (urinary tract infection) 04/20/2016  . Dyspnea 07/19/2015  . Interstitial lung disease ([place]) 11/20/2014  . SIRS (systemic inflammatory response syndrome) ([place]) 02/03/2014  . IDDM (insulin dependent diabetes mellitus) 02/03/2014  . Tachycardia 06/14/2013  . Chronic systolic CHF (congestive heart failure) ([place]) 09/29/2012  . Hypoxia 03/06/2012  . Hypertensive heart disease 03/06/2012  . Nonischemic cardiomyopathy ([place]) 03/06/2012  . Tobacco abuse 03/06/2012  . Type 2 diabetes mellitus ([place]) 03/06/2012  . Hypertension 03/06/2012  . CAD (coronary artery disease), native coronary artery 03/06/2012  . Hypokalemia 03/06/2012  . Acute bronchitis 02/01/2009  . Sleep apnea 02/01/2009  . Chest pain 02/01/2009    Past Surgical History:  Procedure Laterality Date  . BI-VENTRICULAR IMPLANTABLE CARDIOVERTER DEFIBRILLATOR N/A 11/18/2012   Procedure: BI-VENTRICULAR IMPLANTABLE CARDIOVERTER DEFIBRILLATOR  (CRT-D);  Surgeon: Evans Lance, MD;  Location: [facility] CATH LAB;  Service: Cardiovascular;  Laterality: N/A;  . BI-VENTRICULAR IMPLANTABLE  CARDIOVERTER DEFIBRILLATOR  (CRT-D)  11/18/2012  . CARDIAC CATHETERIZATION  03/04/12   nonobstructive CAD, elevated LVEDP and tortuous vessels suggestive of long-standing hypertension  . COLONOSCOPY WITH PROPOFOL N/A 12/12/2016   Procedure: COLONOSCOPY WITH PROPOFOL;  Surgeon: Carol Ada, MD;  Location: [facility] ENDOSCOPY;  Service: Endoscopy;  Laterality: N/A;   . JOINT REPLACEMENT     Bilateral hip and right knee  . LEFT HEART CATH N/A 03/05/2012   Procedure: LEFT HEART CATH;  Surgeon: Larey Dresser, MD;  Location: [facility] CATH LAB;  Service: Cardiovascular;  Laterality: N/A;     OB History   No obstetric history on file.     Family History  Problem Relation Age of Onset  . Heart failure Mother   . Heart disease Neg Hx     Social History   Tobacco Use  . Smoking status: Former Smoker    Packs/day: 1.00    Years: 40.00    Pack years: 40.00    Types: Cigarettes    Start date: 06/23/1972    Quit date: 03/26/2012    Years since quitting: 8.1  . Smokeless tobacco: Never Used  Vaping Use  . Vaping Use: Never used  Substance Use Topics  . Alcohol use: No  . Drug use: No    Home Medications Prior to Admission medications   Medication Sig Start Date End Date Taking? Authorizing Provider  albuterol (VENTOLIN HFA) 108 (90 Base) MCG/ACT inhaler Inhale 2 puffs into the lungs every 6 (six) hours as needed for wheezing or shortness of breath. 02/06/20   Kinnie Feil, PA-C  atorvastatin (LIPITOR) 40 MG tablet TAKE 1 TABLET(40 MG) BY MOUTH DAILY 04/26/19   Larey Dresser, MD  baclofen (LIORESAL) 10 MG tablet  02/08/18   [provider]  benzonatate (TESSALON PERLES) 100 MG capsule Take 2 capsules (200 mg total) by mouth at bedtime. FOR DISRUPTIVE NIGHT TIME COUGH Patient not taking: No sig reported 02/05/20   Kinnie Feil, PA-C  carvedilol (COREG) 25 MG tablet TAKE 1 TABLET(25 MG) BY MOUTH TWICE DAILY WITH A MEAL 10/28/19   Larey Dresser, MD  ELIQUIS 5 MG TABS tablet TAKE 1 TABLET(5 MG) BY MOUTH TWICE DAILY 08/16/19   Larey Dresser, MD  furosemide (LASIX) 20 MG tablet Take 3 tablets (60 mg total) by mouth daily. 03/15/20   Larey Dresser, MD  gabapentin (NEURONTIN) 100 MG capsule TK 1 C PO TID 03/10/18   [provider]  glucose blood (FREESTYLE TEST STRIPS) test strip Use as instructed Patient not taking: Reported  on 05/09/2020 04/25/16   Jonetta Osgood, MD  glucose monitoring kit (FREESTYLE) monitoring kit 1 each by Does not apply route 4 (four) times daily - after meals and at bedtime. 1 month Diabetic Testing Supplies for QAC-QHS accuchecks. Patient not taking: No sig reported 04/25/16   Jonetta Osgood, MD  HUMALOG KWIKPEN 100 UNIT/ML KwikPen Sliding scale 02/12/18   [provider]  insulin glargine (LANTUS) 100 UNIT/ML Solostar Pen Inject 60 Units into the skin at bedtime.    [provider]  Insulin Pen Needle 32G X 8 MM MISC Use as directed 06/18/16   Arbutus Leas, NP  isosorbide-hydrALAZINE (BIDIL) 20-37.5 MG tablet Take 2 tablets by mouth 3 (three) times daily. 02/23/20   Larey Dresser, MD  Lancets (FREESTYLE) lancets Use as instructed Patient not taking: No sig reported 06/18/16   Arbutus Leas, NP  metFORMIN (GLUCOPHAGE) 1000 MG tablet Take 1,000  mg by mouth 2 (two) times daily with a meal.    [provider]  ondansetron (ZOFRAN) 4 MG tablet Take 1 tablet (4 mg total) by mouth every 6 (six) hours. 05/18/20   Valarie Merino, MD  Oxycodone HCl 10 MG TABS Take 10 mg by mouth 3 (three) times daily as needed. 01/09/20   [provider]  potassium chloride SA (KLOR-CON) 20 MEQ tablet TAKE 3 TABLETS(60 MEQ) BY MOUTH DAILY 11/09/19   Larey Dresser, MD  sacubitril-valsartan (ENTRESTO) 97-103 MG Take 1 tablet by mouth 2 (two) times daily. 08/16/19   Larey Dresser, MD  spironolactone (ALDACTONE) 25 MG tablet TAKE 1 TABLET(25 MG) BY MOUTH DAILY 10/28/19   Larey Dresser, MD  Tafamidis 61 MG CAPS TAKE 1 CAPSULE (61 MG) BY MOUTH DAILY. 09/30/19 09/29/20  Larey Dresser, MD  XIIDRA 5 % SOLN Place 1 drop into both eyes 2 (two) times daily. 08/17/19   [provider]    Allergies    Morphine and related and Penicillins  Review of Systems   Review of Systems  Gastrointestinal: Positive for diarrhea.  All other systems reviewed and are  negative.   Physical Exam Updated Vital Signs BP (!) 152/74 (BP Location: Right Arm)   Pulse 79   Temp 99 F (37.2 C) (Oral)   Resp 17   Ht '5\' 7"'  (1.702 m)   Wt 111 kg   SpO2 98%   BMI 38.33 kg/m   Physical Exam Vitals and nursing Wilson reviewed.  Constitutional:      General: She is not in acute distress.    Appearance: Normal appearance. She is well-developed.  HENT:     Head: Normocephalic and atraumatic.  Eyes:     Conjunctiva/sclera: Conjunctivae normal.     Pupils: Pupils are equal, round, and reactive to light.  Cardiovascular:     Rate and Rhythm: Normal rate and regular rhythm.     Heart sounds: Normal heart sounds.  Pulmonary:     Effort: Pulmonary effort is normal. No respiratory distress.     Breath sounds: Normal breath sounds.  Abdominal:     General: There is no distension.     Palpations: Abdomen is soft.     Tenderness: There is no abdominal tenderness.  Genitourinary:    Comments: No gross blood or melanotic stool.  Musculoskeletal:        General: No deformity. Normal range of motion.     Cervical back: Normal range of motion and neck supple.  Skin:    General: Skin is warm and dry.  Neurological:     General: No focal deficit present.     Mental Status: She is alert and oriented to person, place, and time. Mental status is at baseline.     ED Results / Procedures / Treatments   Labs (all labs ordered are listed, but only abnormal results are displayed) Labs Reviewed  COMPREHENSIVE METABOLIC PANEL - Abnormal; Notable for the following components:      Result Value   Potassium 3.1 (*)    CO2 20 (*)    Creatinine, Ser 1.01 (*)    Calcium 8.1 (*)    Albumin 3.2 (*)    All other components within normal limits  CBC WITH DIFFERENTIAL/PLATELET - Abnormal; Notable for the following components:   RBC 3.34 (*)    Hemoglobin 10.2 (*)    HCT 31.1 (*)    All other components within normal limits  LIPASE, BLOOD  OCCULT BLOOD X 1 CARD TO LAB,  STOOL  URINALYSIS, ROUTINE W REFLEX MICROSCOPIC  POC OCCULT BLOOD, ED  POC OCCULT BLOOD, ED  TYPE AND SCREEN    EKG EKG Interpretation  Date/Time:  Friday May 18 2020 19:13:02 EDT Ventricular Rate:  76 PR Interval:  179 QRS Duration: 135 QT Interval:  442 QTC Calculation: 520 R Axis:   64 Text Interpretation: Sinus rhythm Ventricular premature complex Nonspecific intraventricular conduction delay Nonspecific T abnormalities, lateral leads Confirmed by Dene Gentry [phone]) on 05/18/2020 7:20:09 PM   Radiology CT Abdomen Pelvis W Contrast  Result Date: 05/18/2020 CLINICAL DATA:  Abdominal pain, nausea, and diarrhea for 4 days. EXAM: CT ABDOMEN AND PELVIS WITH CONTRAST TECHNIQUE: Multidetector CT imaging of the abdomen and pelvis was performed using the standard protocol following bolus administration of intravenous contrast. CONTRAST:  110m OMNIPAQUE IOHEXOL 300 MG/ML  SOLN COMPARISON:  10/15/2017 FINDINGS: Lower Chest: No acute findings. Hepatobiliary: No hepatic masses identified. Gallbladder is unremarkable. No evidence of biliary ductal dilatation. Pancreas:  No mass or inflammatory changes. Spleen: Within normal limits in size and appearance. Adrenals/Urinary Tract: No masses identified. No evidence of ureteral calculi or hydronephrosis. Stomach/Bowel: No evidence of obstruction, inflammatory process or abnormal fluid collections. Normal appendix visualized. Vascular/Lymphatic: No pathologically enlarged lymph nodes. No acute vascular findings. Aortic atherosclerotic calcification noted. Reproductive: Poor visualization due to severe artifact from bilateral hip prostheses. Other:  None. Musculoskeletal:  No suspicious bone lesions identified. IMPRESSION: No acute findings within the abdomen or pelvis. Aortic Atherosclerosis (ICD10-I70.0). Electronically Signed   By: JMarlaine HindM.D.   On: 05/18/2020 21:21    Procedures Procedures   Medications Ordered in ED Medications  iohexol  (OMNIPAQUE) 300 MG/ML solution 100 mL (100 mLs Intravenous Contrast Given 05/18/20 2045)    ED Course  I have reviewed the triage vital signs and the nursing notes.  Pertinent labs & imaging results that were available during my care of the patient were reviewed by me and considered in my medical decision making (see chart for details).    MDM Rules/Calculators/A&P                          MDM  MSE complete  Colleen FLANIGANwas evaluated in Emergency Department on 05/18/2020 for the symptoms described in the history of present illness. She was evaluated in the context of the global COVID-19 pandemic, which necessitated consideration that the patient might be at risk for infection with the SARS-CoV-2 virus that causes COVID-19. Institutional protocols and algorithms that pertain to the evaluation of patients at risk for COVID-19 are in a state of rapid change based on information released by regulatory bodies including the CDC and federal and state organizations. These policies and algorithms were followed during the patient's care in the ED.   Patient is presenting with complaints of darker stool with recent symptoms digestive of possible GE.  Patient is concerned about internal GI bleeding.  Patient's stool guaiac is negative.  Her stool is not melanotic on exam.  CT is without significant abnormality.  Patient's labs are without significant abnormality.  Patient's hemoglobin today is 10.2.  Patient is improved after ED evaluation and treatment.  Patient desires discharge home.  Importance of close follow-up stressed.  Strict return precautions given and understood.   Final Clinical Impression(s) / ED Diagnoses Final diagnoses:  Diarrhea, unspecified type    Rx / DC Orders ED Discharge Orders  Ordered    ondansetron (ZOFRAN) 4 MG tablet  Every 6 hours,   Status:  Discontinued        05/18/20 2158    ondansetron (ZOFRAN) 4 MG tablet  Every 6 hours        05/18/20 2158            Valarie Merino, MD 05/18/20 2202

## 2020-05-18 NOTE — ED Notes (Signed)
Patient is in CT Scan

## 2020-05-23 ENCOUNTER — Other Ambulatory Visit (HOSPITAL_COMMUNITY): Payer: Self-pay

## 2020-05-23 NOTE — Progress Notes (Signed)
Paramedicine Encounter    Patient ID: [name], female    DOB: [birth year], 70 y.o.   MRN: [id number]   Patient Care Team: Sandi Mariscal, MD as PCP - General (Internal Medicine) Jorge Ny, LCSW as Social Worker (Licensed Clinical Social Worker)  Patient Active Problem List   Diagnosis Date Noted  . Right foot infection 10/22/2017  . Cellulitis in diabetic foot ([place]) 10/22/2017  . Hyperglycemia 10/22/2017  . Lumbar radiculopathy 09/18/2017  . Degenerative spondylolisthesis 09/18/2017  . Atrial fibrillation ([place]) 02/11/2017  . Paroxysmal A-fib ([place])   . Biventricular automatic implantable cardioverter defibrillator in situ   . Sepsis ([place]) 04/20/2016  . UTI (urinary tract infection) 04/20/2016  . Dyspnea 07/19/2015  . Interstitial lung disease ([place]) 11/20/2014  . SIRS (systemic inflammatory response syndrome) ([place]) 02/03/2014  . IDDM (insulin dependent diabetes mellitus) 02/03/2014  . Tachycardia 06/14/2013  . Chronic systolic CHF (congestive heart failure) ([place]) 09/29/2012  . Hypoxia 03/06/2012  . Hypertensive heart disease 03/06/2012  . Nonischemic cardiomyopathy ([place]) 03/06/2012  . Tobacco abuse 03/06/2012  . Type 2 diabetes mellitus ([place]) 03/06/2012  . Hypertension 03/06/2012  . CAD (coronary artery disease), native coronary artery 03/06/2012  . Hypokalemia 03/06/2012  . Acute bronchitis 02/01/2009  . Sleep apnea 02/01/2009  . Chest pain 02/01/2009    Current Outpatient Medications:  .  albuterol (VENTOLIN HFA) 108 (90 Base) MCG/ACT inhaler, Inhale 2 puffs into the lungs every 6 (six) hours as needed for wheezing or shortness of breath., Disp: 18 g, Rfl: 0 .  atorvastatin (LIPITOR) 40 MG tablet, TAKE 1 TABLET(40 MG) BY MOUTH DAILY, Disp: 30 tablet, Rfl: 11 .  baclofen (LIORESAL) 10 MG tablet, , Disp: , Rfl:  .  benzonatate (TESSALON PERLES) 100 MG capsule, Take 2 capsules (200 mg total) by mouth at bedtime. FOR DISRUPTIVE NIGHT TIME COUGH (Patient not taking: No sig  reported), Disp: 20 capsule, Rfl: 0 .  carvedilol (COREG) 25 MG tablet, TAKE 1 TABLET(25 MG) BY MOUTH TWICE DAILY WITH A MEAL, Disp: 180 tablet, Rfl: 1 .  ELIQUIS 5 MG TABS tablet, TAKE 1 TABLET(5 MG) BY MOUTH TWICE DAILY, Disp: 60 tablet, Rfl: 1 .  furosemide (LASIX) 20 MG tablet, Take 3 tablets (60 mg total) by mouth daily., Disp: 270 tablet, Rfl: 3 .  gabapentin (NEURONTIN) 100 MG capsule, TK 1 C PO TID, Disp: , Rfl:  .  glucose blood (FREESTYLE TEST STRIPS) test strip, Use as instructed (Patient not taking: Reported on 05/09/2020), Disp: 100 each, Rfl: 0 .  glucose monitoring kit (FREESTYLE) monitoring kit, 1 each by Does not apply route 4 (four) times daily - after meals and at bedtime. 1 month Diabetic Testing Supplies for QAC-QHS accuchecks. (Patient not taking: No sig reported), Disp: 1 each, Rfl: 1 .  HUMALOG KWIKPEN 100 UNIT/ML KwikPen, Sliding scale, Disp: , Rfl:  .  insulin glargine (LANTUS) 100 UNIT/ML Solostar Pen, Inject 60 Units into the skin at bedtime., Disp: , Rfl:  .  Insulin Pen Needle 32G X 8 MM MISC, Use as directed, Disp: 100 each, Rfl: 0 .  isosorbide-hydrALAZINE (BIDIL) 20-37.5 MG tablet, Take 2 tablets by mouth 3 (three) times daily., Disp: 180 tablet, Rfl: 6 .  Lancets (FREESTYLE) lancets, Use as instructed (Patient not taking: No sig reported), Disp: 100 each, Rfl: 0 .  metFORMIN (GLUCOPHAGE) 1000 MG tablet, Take 1,000 mg by mouth 2 (two) times daily with a meal., Disp: , Rfl:  .  ondansetron (ZOFRAN) 4 MG tablet, Take 1  tablet (4 mg total) by mouth every 6 (six) hours., Disp: 12 tablet, Rfl: 0 .  Oxycodone HCl 10 MG TABS, Take 10 mg by mouth 3 (three) times daily as needed., Disp: , Rfl:  .  potassium chloride SA (KLOR-CON) 20 MEQ tablet, TAKE 3 TABLETS(60 MEQ) BY MOUTH DAILY, Disp: 270 tablet, Rfl: 3 .  sacubitril-valsartan (ENTRESTO) 97-103 MG, Take 1 tablet by mouth 2 (two) times daily., Disp: 180 tablet, Rfl: 3 .  spironolactone (ALDACTONE) 25 MG tablet, TAKE 1  TABLET(25 MG) BY MOUTH DAILY, Disp: 90 tablet, Rfl: 3 .  Tafamidis 61 MG CAPS, TAKE 1 CAPSULE (61 MG) BY MOUTH DAILY., Disp: 90 capsule, Rfl: 3 .  XIIDRA 5 % SOLN, Place 1 drop into both eyes 2 (two) times daily., Disp: , Rfl:  Allergies  Allergen Reactions  . Morphine And Related Nausea Only    Severe nausea  . Penicillins Other (See Comments)    Pt states she has had a pain in her leg since a penicillin injection 2 months ago (reported 08/31/17).  Has tolerated amoxicillin oral.  Has patient had a PCN reaction causing immediate rash, facial/tongue/throat swelling, SOB or lightheadedness with hypotension: No Has patient had a PCN reaction causing severe rash involving mucus membranes or skin necrosis: No Has patient had a PCN reaction that required hospitalization: No Has patient had a PCN reaction occurring within the last 10 years: Yes If a     Social History   Socioeconomic History  . Marital status: Single    Spouse name: Not on file  . Number of children: 4  . Years of education: 79  . Highest education level: Not on file  Occupational History  . Not on file  Tobacco Use  . Smoking status: Former Smoker    Packs/day: 1.00    Years: 40.00    Pack years: 40.00    Types: Cigarettes    Start date: 06/23/1972    Quit date: 03/26/2012    Years since quitting: 8.1  . Smokeless tobacco: Never Used  Vaping Use  . Vaping Use: Never used  Substance and Sexual Activity  . Alcohol use: No  . Drug use: No  . Sexual activity: Yes  Other Topics Concern  . Not on file  Social History Narrative  . Not on file   Social Determinants of Health   Financial Resource Strain: Not on file  Food Insecurity: Not on file  Transportation Needs: Not on file  Physical Activity: Not on file  Stress: Not on file  Social Connections: Not on file  Intimate Partner Violence: Not on file    Physical Exam Vitals reviewed.  Constitutional:      Appearance: Normal appearance. She is normal  weight.  HENT:     Head: Normocephalic.     Nose: Nose normal.     Mouth/Throat:     Mouth: Mucous membranes are moist.     Pharynx: Oropharynx is clear.  Eyes:     Conjunctiva/sclera: Conjunctivae normal.     Pupils: Pupils are equal, round, and reactive to light.  Cardiovascular:     Rate and Rhythm: Normal rate and regular rhythm.  Pulmonary:     Effort: Pulmonary effort is normal.     Breath sounds: Normal breath sounds.  Abdominal:     General: Abdomen is flat.     Palpations: Abdomen is soft.  Musculoskeletal:        General: No swelling. Normal range of motion.  Cervical back: Normal range of motion.     Right lower leg: No edema.     Left lower leg: No edema.  Skin:    General: Skin is warm.     Capillary Refill: Capillary refill takes less than 2 seconds.  Neurological:     General: No focal deficit present.     Mental Status: She is alert. Mental status is at baseline.  Psychiatric:        Mood and Affect: Mood normal.    Arrived for home visit for [name] who was alert and oriented laying on the couch. [name] reports she was recently hospitalized in the ED for the [facility] after vacationing at [facility]. [name] reports she is feeling much better now. [name] denied any nausea, vomiting, diarrhea or dizziness upon assessment. No chest pain, shortness of breath reported. I obtained vitals as they are noted. CBG- 177  I reviewed medications and filled two pill boxes for [name]. I plan to come out for next visit in two weeks. Home visit complete.   Refills: (next week) Gabapentin Potassium Baclofen Carvedilol       Future Appointments  Date Time Provider [place]  06/19/2020  7:10 AM [facility] DEVICE REMOTES [facility]  07/16/2020  9:00 AM [facility] PA/NP [facility] None  07/23/2020  7:00 AM [facility] DEVICE REMOTES [facility]  08/16/2020  4:00 PM [facility] DEVICE REMOTES [facility]  09/18/2020  7:10  AM [facility] DEVICE REMOTES [facility]  10/22/2020  7:05 AM [facility] DEVICE REMOTES [facility]  11/15/2020  4:00 PM [facility] DEVICE REMOTES [facility]  12/24/2020  7:15 AM [facility] DEVICE REMOTES [facility]  01/11/2021  9:15 AM Bernarda Caffey, MD [facility] None  02/14/2021  4:00 PM [facility] DEVICE REMOTES [facility]  05/16/2021  4:00 PM [facility] DEVICE REMOTES [facility]  08/15/2021  4:00 PM [facility] DEVICE REMOTES [facility]  11/14/2021  4:00 PM [facility] DEVICE REMOTES [facility]     ACTION: Home visit completed Next visit planned for two weeks

## 2020-05-28 LAB — CUP PACEART REMOTE DEVICE CHECK
Battery Remaining Longevity: 6 mo
Battery Voltage: 2.81 V
Brady Statistic AP VP Percent: 0.04 %
Brady Statistic AP VS Percent: 0.01 %
Brady Statistic AS VP Percent: 98.54 %
Brady Statistic AS VS Percent: 1.41 %
Brady Statistic RA Percent Paced: 0.05 %
Brady Statistic RV Percent Paced: 7.72 %
Date Time Interrogation Session: 20220426210000
HighPow Impedance: 62 Ohm
Implantable Lead Implant Date: 20141023
Implantable Lead Implant Date: 20141023
Implantable Lead Implant Date: 20141023
Implantable Lead Location: 753858
Implantable Lead Location: 753859
Implantable Lead Location: 753860
Implantable Lead Model: 4298
Implantable Lead Model: 5076
Implantable Lead Model: 6935
Implantable Pulse Generator Implant Date: 20141023
Lead Channel Impedance Value: 342 Ohm
Lead Channel Impedance Value: 342 Ohm
Lead Channel Impedance Value: 361 Ohm
Lead Channel Impedance Value: 399 Ohm
Lead Channel Impedance Value: 399 Ohm
Lead Channel Impedance Value: 418 Ohm
Lead Channel Impedance Value: 475 Ohm
Lead Channel Impedance Value: 475 Ohm
Lead Channel Impedance Value: 589 Ohm
Lead Channel Impedance Value: 589 Ohm
Lead Channel Impedance Value: 589 Ohm
Lead Channel Impedance Value: 608 Ohm
Lead Channel Impedance Value: 608 Ohm
Lead Channel Pacing Threshold Amplitude: 0.5 V
Lead Channel Pacing Threshold Amplitude: 0.625 V
Lead Channel Pacing Threshold Amplitude: 1.25 V
Lead Channel Pacing Threshold Pulse Width: 0.4 ms
Lead Channel Pacing Threshold Pulse Width: 0.4 ms
Lead Channel Pacing Threshold Pulse Width: 0.4 ms
Lead Channel Sensing Intrinsic Amplitude: 14.75 mV
Lead Channel Sensing Intrinsic Amplitude: 14.75 mV
Lead Channel Sensing Intrinsic Amplitude: 2 mV
Lead Channel Sensing Intrinsic Amplitude: 2 mV
Lead Channel Setting Pacing Amplitude: 1.75 V
Lead Channel Setting Pacing Amplitude: 2.5 V
Lead Channel Setting Pacing Amplitude: 3 V
Lead Channel Setting Pacing Pulse Width: 0.4 ms
Lead Channel Setting Pacing Pulse Width: 0.4 ms
Lead Channel Setting Sensing Sensitivity: 0.45 mV

## 2020-05-30 ENCOUNTER — Other Ambulatory Visit (HOSPITAL_COMMUNITY): Payer: Self-pay

## 2020-05-30 MED ORDER — ATORVASTATIN CALCIUM 40 MG PO TABS: ORAL_TABLET | ORAL | 11 refills | Status: DC

## 2020-05-31 ENCOUNTER — Other Ambulatory Visit (HOSPITAL_COMMUNITY): Payer: Self-pay

## 2020-05-31 MED FILL — Tafamidis Cap 61 MG: ORAL | 30 days supply | Qty: 30 | Fill #1 | Status: AC

## 2020-06-05 ENCOUNTER — Telehealth (HOSPITAL_COMMUNITY): Payer: Self-pay

## 2020-06-05 NOTE — Progress Notes (Signed)
Remote ICD transmission.   

## 2020-06-05 NOTE — Telephone Encounter (Signed)
Attempted to reach [name] to schedule home visit for tomorrow with no answer. I will attempt to call her again tomorrow.

## 2020-06-06 ENCOUNTER — Telehealth (HOSPITAL_COMMUNITY): Payer: Self-pay

## 2020-06-06 NOTE — Telephone Encounter (Signed)
Attempted to call [name] who did not answer and her voicemail box is full. I did send a text message as well, awaiting her reply. Will continue to reach out for home visit.

## 2020-06-07 ENCOUNTER — Other Ambulatory Visit (HOSPITAL_COMMUNITY): Payer: Self-pay

## 2020-06-07 MED ORDER — ATORVASTATIN CALCIUM 40 MG PO TABS: ORAL_TABLET | ORAL | 1 refills | Status: DC

## 2020-06-13 ENCOUNTER — Other Ambulatory Visit (HOSPITAL_COMMUNITY): Payer: Self-pay

## 2020-06-13 NOTE — Progress Notes (Signed)
Paramedicine Encounter    Patient ID: [name], female    DOB: 09-Sep-1950, 70 y.o.   MRN: [id number]  Came out for home visit for [name]. [name] requested medication pill boxes to be refilled. I verified medications and confirmed same filling two pill boxes. #2 pill box missing Carvedilol AM/PM dose. Instructions left for patient to place in #2 pill box once Carvedilol is received. [name] agreed with plan. Visit complete.   Refills:  BIDIL CARVEDILOL BACLOFEN     ACTION: Home visit completed Next visit planned for two weeks

## 2020-06-19 ENCOUNTER — Ambulatory Visit (INDEPENDENT_AMBULATORY_CARE_PROVIDER_SITE_OTHER): Payer: Medicare Other

## 2020-06-19 DIAGNOSIS — I428 Other cardiomyopathies: Secondary | ICD-10-CM

## 2020-06-25 LAB — CUP PACEART REMOTE DEVICE CHECK
Battery Remaining Longevity: 4 mo
Battery Voltage: 2.81 V
Brady Statistic AP VP Percent: 0.02 %
Brady Statistic AP VS Percent: 0.01 %
Brady Statistic AS VP Percent: 98.66 %
Brady Statistic AS VS Percent: 1.3 %
Brady Statistic RA Percent Paced: 0.03 %
Brady Statistic RV Percent Paced: 6.97 %
Date Time Interrogation Session: 20220527102409
HighPow Impedance: 64 Ohm
Implantable Lead Implant Date: 20141023
Implantable Lead Implant Date: 20141023
Implantable Lead Implant Date: 20141023
Implantable Lead Location: 753858
Implantable Lead Location: 753859
Implantable Lead Location: 753860
Implantable Lead Model: 4298
Implantable Lead Model: 5076
Implantable Lead Model: 6935
Implantable Pulse Generator Implant Date: 20141023
Lead Channel Impedance Value: 342 Ohm
Lead Channel Impedance Value: 342 Ohm
Lead Channel Impedance Value: 342 Ohm
Lead Channel Impedance Value: 399 Ohm
Lead Channel Impedance Value: 399 Ohm
Lead Channel Impedance Value: 418 Ohm
Lead Channel Impedance Value: 456 Ohm
Lead Channel Impedance Value: 475 Ohm
Lead Channel Impedance Value: 551 Ohm
Lead Channel Impedance Value: 551 Ohm
Lead Channel Impedance Value: 589 Ohm
Lead Channel Impedance Value: 589 Ohm
Lead Channel Impedance Value: 608 Ohm
Lead Channel Pacing Threshold Amplitude: 0.5 V
Lead Channel Pacing Threshold Amplitude: 0.5 V
Lead Channel Pacing Threshold Amplitude: 1.25 V
Lead Channel Pacing Threshold Pulse Width: 0.4 ms
Lead Channel Pacing Threshold Pulse Width: 0.4 ms
Lead Channel Pacing Threshold Pulse Width: 0.4 ms
Lead Channel Sensing Intrinsic Amplitude: 14.875 mV
Lead Channel Sensing Intrinsic Amplitude: 14.875 mV
Lead Channel Sensing Intrinsic Amplitude: 2.75 mV
Lead Channel Sensing Intrinsic Amplitude: 2.75 mV
Lead Channel Setting Pacing Amplitude: 1.5 V
Lead Channel Setting Pacing Amplitude: 2.5 V
Lead Channel Setting Pacing Amplitude: 2.75 V
Lead Channel Setting Pacing Pulse Width: 0.4 ms
Lead Channel Setting Pacing Pulse Width: 0.4 ms
Lead Channel Setting Sensing Sensitivity: 0.45 mV

## 2020-06-26 ENCOUNTER — Telehealth (HOSPITAL_COMMUNITY): Payer: Self-pay

## 2020-06-26 NOTE — Telephone Encounter (Signed)
Spoke to [place] to set up home visit for tomorrow and she stated she has one week worth of medicines left and for me to come next week. I advised her she should be out of medicines and she reports she has one whole week left. I will see her next Wednesday. Call complete.

## 2020-07-04 ENCOUNTER — Other Ambulatory Visit (HOSPITAL_COMMUNITY): Payer: Self-pay

## 2020-07-04 NOTE — Progress Notes (Signed)
Paramedicine Encounter    Patient ID: [name], female    DOB: [birth year], 70 y.o.   MRN: [id number]   Patient Care Team: Sandi Mariscal, MD as PCP - General (Internal Medicine) Jorge Ny, LCSW as Social Worker (Licensed Clinical Social Worker)  Patient Active Problem List   Diagnosis Date Noted  . Right foot infection 10/22/2017  . Cellulitis in diabetic foot ([place]) 10/22/2017  . Hyperglycemia 10/22/2017  . Lumbar radiculopathy 09/18/2017  . Degenerative spondylolisthesis 09/18/2017  . Atrial fibrillation ([place]) 02/11/2017  . Paroxysmal A-fib ([place])   . Biventricular automatic implantable cardioverter defibrillator in situ   . Sepsis ([place]) 04/20/2016  . UTI (urinary tract infection) 04/20/2016  . Dyspnea 07/19/2015  . Interstitial lung disease ([place]) 11/20/2014  . SIRS (systemic inflammatory response syndrome) ([place]) 02/03/2014  . IDDM (insulin dependent diabetes mellitus) 02/03/2014  . Tachycardia 06/14/2013  . Chronic systolic CHF (congestive heart failure) ([place]) 09/29/2012  . Hypoxia 03/06/2012  . Hypertensive heart disease 03/06/2012  . Nonischemic cardiomyopathy ([place]) 03/06/2012  . Tobacco abuse 03/06/2012  . Type 2 diabetes mellitus ([place]) 03/06/2012  . Hypertension 03/06/2012  . CAD (coronary artery disease), native coronary artery 03/06/2012  . Hypokalemia 03/06/2012  . Acute bronchitis 02/01/2009  . Sleep apnea 02/01/2009  . Chest pain 02/01/2009    Current Outpatient Medications:  .  albuterol (VENTOLIN HFA) 108 (90 Base) MCG/ACT inhaler, Inhale 2 puffs into the lungs every 6 (six) hours as needed for wheezing or shortness of breath., Disp: 18 g, Rfl: 0 .  atorvastatin (LIPITOR) 40 MG tablet, TAKE 1 TABLET(40 MG) BY MOUTH DAILY, Disp: 90 tablet, Rfl: 1 .  baclofen (LIORESAL) 10 MG tablet, , Disp: , Rfl:  .  benzonatate (TESSALON PERLES) 100 MG capsule, Take 2 capsules (200 mg total) by mouth at bedtime. FOR DISRUPTIVE NIGHT TIME COUGH, Disp: 20 capsule, Rfl: 0 .   carvedilol (COREG) 25 MG tablet, TAKE 1 TABLET(25 MG) BY MOUTH TWICE DAILY WITH A MEAL, Disp: 180 tablet, Rfl: 1 .  ELIQUIS 5 MG TABS tablet, TAKE 1 TABLET(5 MG) BY MOUTH TWICE DAILY, Disp: 60 tablet, Rfl: 1 .  furosemide (LASIX) 20 MG tablet, Take 3 tablets (60 mg total) by mouth daily., Disp: 270 tablet, Rfl: 3 .  gabapentin (NEURONTIN) 100 MG capsule, TK 1 C PO TID, Disp: , Rfl:  .  glucose blood (FREESTYLE TEST STRIPS) test strip, Use as instructed, Disp: 100 each, Rfl: 0 .  glucose monitoring kit (FREESTYLE) monitoring kit, 1 each by Does not apply route 4 (four) times daily - after meals and at bedtime. 1 month Diabetic Testing Supplies for QAC-QHS accuchecks., Disp: 1 each, Rfl: 1 .  HUMALOG KWIKPEN 100 UNIT/ML KwikPen, Sliding scale, Disp: , Rfl:  .  insulin glargine (LANTUS) 100 UNIT/ML Solostar Pen, Inject 60 Units into the skin at bedtime., Disp: , Rfl:  .  Insulin Pen Needle 32G X 8 MM MISC, Use as directed, Disp: 100 each, Rfl: 0 .  isosorbide-hydrALAZINE (BIDIL) 20-37.5 MG tablet, Take 2 tablets by mouth 3 (three) times daily., Disp: 180 tablet, Rfl: 6 .  Lancets (FREESTYLE) lancets, Use as instructed, Disp: 100 each, Rfl: 0 .  metFORMIN (GLUCOPHAGE) 1000 MG tablet, Take 1,000 mg by mouth 2 (two) times daily with a meal., Disp: , Rfl:  .  ondansetron (ZOFRAN) 4 MG tablet, Take 1 tablet (4 mg total) by mouth every 6 (six) hours., Disp: 12 tablet, Rfl: 0 .  Oxycodone HCl 10 MG TABS, Take 10  mg by mouth 3 (three) times daily as needed., Disp: , Rfl:  .  potassium chloride SA (KLOR-CON) 20 MEQ tablet, TAKE 3 TABLETS(60 MEQ) BY MOUTH DAILY, Disp: 270 tablet, Rfl: 3 .  sacubitril-valsartan (ENTRESTO) 97-103 MG, Take 1 tablet by mouth 2 (two) times daily., Disp: 180 tablet, Rfl: 3 .  spironolactone (ALDACTONE) 25 MG tablet, TAKE 1 TABLET(25 MG) BY MOUTH DAILY, Disp: 90 tablet, Rfl: 3 .  Tafamidis 61 MG CAPS, TAKE 1 CAPSULE (61 MG) BY MOUTH DAILY., Disp: 90 capsule, Rfl: 3 .  XIIDRA 5 % SOLN,  Place 1 drop into both eyes 2 (two) times daily., Disp: , Rfl:  Allergies  Allergen Reactions  . Morphine And Related Nausea Only    Severe nausea  . Penicillins Other (See Comments)    Pt states she has had a pain in her leg since a penicillin injection 2 months ago (reported 08/31/17).  Has tolerated amoxicillin oral.  Has patient had a PCN reaction causing immediate rash, facial/tongue/throat swelling, SOB or lightheadedness with hypotension: No Has patient had a PCN reaction causing severe rash involving mucus membranes or skin necrosis: No Has patient had a PCN reaction that required hospitalization: No Has patient had a PCN reaction occurring within the last 10 years: Yes If a     Social History   Socioeconomic History  . Marital status: Single    Spouse name: Not on file  . Number of children: 4  . Years of education: 39  . Highest education level: Not on file  Occupational History  . Not on file  Tobacco Use  . Smoking status: Former Smoker    Packs/day: 1.00    Years: 40.00    Pack years: 40.00    Types: Cigarettes    Start date: 06/23/1972    Quit date: 03/26/2012    Years since quitting: 8.2  . Smokeless tobacco: Never Used  Vaping Use  . Vaping Use: Never used  Substance and Sexual Activity  . Alcohol use: No  . Drug use: No  . Sexual activity: Yes  Other Topics Concern  . Not on file  Social History Narrative  . Not on file   Social Determinants of Health   Financial Resource Strain: Not on file  Food Insecurity: Not on file  Transportation Needs: Not on file  Physical Activity: Not on file  Stress: Not on file  Social Connections: Not on file  Intimate Partner Violence: Not on file    Physical Exam Vitals reviewed.  Constitutional:      Appearance: Normal appearance. She is normal weight.  HENT:     Head: Normocephalic.     Nose: Nose normal.     Mouth/Throat:     Mouth: Mucous membranes are moist.     Pharynx: Oropharynx is clear.  Eyes:      Conjunctiva/sclera: Conjunctivae normal.     Pupils: Pupils are equal, round, and reactive to light.  Cardiovascular:     Rate and Rhythm: Normal rate and regular rhythm.     Pulses: Normal pulses.     Heart sounds: Normal heart sounds.  Pulmonary:     Effort: Pulmonary effort is normal.     Breath sounds: Normal breath sounds.  Abdominal:     General: Abdomen is flat.     Palpations: Abdomen is soft.  Musculoskeletal:        General: Swelling present. Normal range of motion.     Cervical back: Normal range of motion.  Right lower leg: Edema present.     Left lower leg: Edema present.  Skin:    General: Skin is warm and dry.     Capillary Refill: Capillary refill takes less than 2 seconds.  Neurological:     General: No focal deficit present.     Mental Status: She is alert. Mental status is at baseline.  Psychiatric:        Mood and Affect: Mood normal.    Arrived for home visit for [facility] who was alert and oriented reporting to be feeling fine today. Jayra denied shortness of breath, dizziness, chest pain. Some edema noted in lower legs, right leg more swollen than the left. Alaine reports she has been drinking a lot of water and eating fried and salty foods. I educated her on same and she verbalized understanding. Vitals obtained. Medications reviewed and confirmed. Pill box filled accordingly. Gresia missing her BIDIL from both pill boxes due to failure of being picked up from pharmacy. I reached out to pharmacy to arrange them for her to pick up. Patrece understood and knows where to place same once picked up. Jaine agreed to visit in two weeks in clinic for appointment on 6/20 at 0900. Home visit complete.   Refills: Metformin Lasix Bidil   CBG- 137      Future Appointments  Date Time Provider [place]  07/16/2020  9:00 AM [facility] PA/NP [facility] None  07/23/2020  7:00 AM [facility] DEVICE REMOTES [facility]  08/16/2020  4:00 PM  [facility] DEVICE REMOTES [facility]  09/18/2020  7:10 AM [facility] DEVICE REMOTES [facility]  10/22/2020  7:05 AM [facility] DEVICE REMOTES [facility]  11/15/2020  4:00 PM [facility] DEVICE REMOTES [facility]  12/24/2020  7:15 AM [facility] DEVICE REMOTES [facility]  01/11/2021  9:15 AM Bernarda Caffey, MD [facility] None  02/14/2021  4:00 PM [facility] DEVICE REMOTES [facility]  05/16/2021  4:00 PM [facility] DEVICE REMOTES [facility]  08/15/2021  4:00 PM [facility] DEVICE REMOTES [facility]  11/14/2021  4:00 PM [facility] DEVICE REMOTES [facility]     ACTION: Home visit completed Next visit planned for two weeks

## 2020-07-05 ENCOUNTER — Other Ambulatory Visit (HOSPITAL_COMMUNITY): Payer: Self-pay

## 2020-07-05 MED FILL — Tafamidis Cap 61 MG: ORAL | 30 days supply | Qty: 30 | Fill #2 | Status: AC

## 2020-07-11 NOTE — Addendum Note (Signed)
Addended by: Cheri Kearns A on: 07/11/2020 09:04 AM   Modules accepted: Level of Service

## 2020-07-11 NOTE — Progress Notes (Signed)
Remote ICD transmission.   

## 2020-07-13 ENCOUNTER — Encounter (HOSPITAL_COMMUNITY): Payer: Medicare Other | Admitting: Cardiology

## 2020-07-16 ENCOUNTER — Telehealth (HOSPITAL_COMMUNITY): Payer: Self-pay

## 2020-07-16 ENCOUNTER — Encounter (HOSPITAL_COMMUNITY): Payer: Medicare Other

## 2020-07-16 NOTE — Telephone Encounter (Signed)
Left message for [name], reminding her of her appointments today in clinic. Will continue to follow.

## 2020-07-19 ENCOUNTER — Other Ambulatory Visit (HOSPITAL_COMMUNITY): Payer: Self-pay

## 2020-07-25 ENCOUNTER — Telehealth (HOSPITAL_COMMUNITY): Payer: Self-pay

## 2020-07-25 NOTE — Telephone Encounter (Signed)
Left message for [name] to return my call. I will continue to reach out.

## 2020-07-31 ENCOUNTER — Other Ambulatory Visit: Payer: Self-pay

## 2020-07-31 ENCOUNTER — Emergency Department (HOSPITAL_COMMUNITY)
Admission: EM | Admit: 2020-07-31 | Discharge: 2020-08-01 | Disposition: A | Discharge status: Home / Self Care | Payer: Medicare Other | Attending: Emergency Medicine | Admitting: Emergency Medicine

## 2020-07-31 ENCOUNTER — Telehealth (HOSPITAL_COMMUNITY): Payer: Self-pay

## 2020-07-31 ENCOUNTER — Encounter (HOSPITAL_COMMUNITY): Payer: Self-pay | Admitting: Emergency Medicine

## 2020-07-31 ENCOUNTER — Emergency Department (HOSPITAL_COMMUNITY): Payer: Medicare Other

## 2020-07-31 DIAGNOSIS — Z5321 Procedure and treatment not carried out due to patient leaving prior to being seen by health care provider: Secondary | ICD-10-CM | POA: Diagnosis not present

## 2020-07-31 DIAGNOSIS — R079 Chest pain, unspecified: Secondary | ICD-10-CM | POA: Insufficient documentation

## 2020-07-31 LAB — CBC WITH DIFFERENTIAL/PLATELET
Abs Immature Granulocytes: 0.07 10*3/uL (ref 0.00–0.07)
Basophils Absolute: 0 10*3/uL (ref 0.0–0.1)
Basophils Relative: 1 %
Eosinophils Absolute: 0.2 10*3/uL (ref 0.0–0.5)
Eosinophils Relative: 3 %
HCT: 33 % — ABNORMAL LOW (ref 36.0–46.0)
Hemoglobin: 11.1 g/dL — ABNORMAL LOW (ref 12.0–15.0)
Immature Granulocytes: 1 %
Lymphocytes Relative: 24 %
Lymphs Abs: 2 10*3/uL (ref 0.7–4.0)
MCH: 30.7 pg (ref 26.0–34.0)
MCHC: 33.6 g/dL (ref 30.0–36.0)
MCV: 91.2 fL (ref 80.0–100.0)
Monocytes Absolute: 0.8 10*3/uL (ref 0.1–1.0)
Monocytes Relative: 10 %
Neutro Abs: 5.3 10*3/uL (ref 1.7–7.7)
Neutrophils Relative %: 61 %
Platelets: 339 10*3/uL (ref 150–400)
RBC: 3.62 MIL/uL — ABNORMAL LOW (ref 3.87–5.11)
RDW: 12.5 % (ref 11.5–15.5)
WBC: 8.4 10*3/uL (ref 4.0–10.5)
nRBC: 0 % (ref 0.0–0.2)

## 2020-07-31 NOTE — ED Provider Notes (Addendum)
Emergency Medicine Provider Triage Evaluation Note  [name] , a 70 y.o. female  was evaluated in triage.  Pt complains of chest pain, central and aching.  She does report cough recently.  Denies fever.  CBG was low with fire and EMS, improved with D10.  Received ASA and NTG en route, pain went from 10 to 4.  States she is feeling much better on arrival to triage.  Followed by cards, Dr. Aundra Dubin.  Review of Systems  Positive: Chest pain, cough Negative: Fever, chills  Physical Exam  BP (!) 145/71 (BP Location: Left Arm)   Pulse 97   Temp 98.2 F (36.8 C) (Oral)   Resp 18   SpO2 99%  Gen:   Awake, no distress   Resp:  Normal effort  MSK:   Moves extremities without difficulty   Medical Decision Making  Medically screening exam initiated at 10:50 PM.  Appropriate orders placed.  [name] was informed that the remainder of the evaluation will be completed by another provider, this initial triage assessment does not replace that evaluation, and the importance of remaining in the ED until their evaluation is complete.  Will re-check CBG.  Labs, EKG, CXR.  Made acuity 2, will prioritize room assignment.   Larene Pickett, PA-C 07/31/20 2253    Larene Pickett, PA-C 07/31/20 2254    Lorelle Gibbs, DO 07/31/20 2331

## 2020-07-31 NOTE — Telephone Encounter (Signed)
Received a phone call earlier today from [facility] asking if I could come out for a home visit to "fix her medications". I have tried several weeks in a row now to reach her via telephone with no success. I informed her I could not come out today and she became very irate and upset. I informed her we were trying to prepare her for discharge and she got very upset and verbalized refusing to take her medications until her next follow up in the HF clinic. I offered multiple times to plan another day for home visit as well as offering moving her to a pharmacy who could deliver her medications and place them in bubble packs for her. She continued to be upset and refuse services. I informed HF team of same.

## 2020-07-31 NOTE — ED Triage Notes (Signed)
Pt BIB [facility] from home, c/o 10/10 chest pain, non-radiating, given 324mg  asa and 1NTG, decreased to 4/10. Also noted to be hypoglycemic at 4 with EMS, given D10 with improvement.

## 2020-08-01 DIAGNOSIS — R079 Chest pain, unspecified: Secondary | ICD-10-CM | POA: Diagnosis not present

## 2020-08-01 LAB — BASIC METABOLIC PANEL
Anion gap: 11 (ref 5–15)
BUN: 13 mg/dL (ref 8–23)
CO2: 26 mmol/L (ref 22–32)
Calcium: 9 mg/dL (ref 8.9–10.3)
Chloride: 102 mmol/L (ref 98–111)
Creatinine, Ser: 0.97 mg/dL (ref 0.44–1.00)
GFR, Estimated: >60 mL/min (ref >60)
Glucose, Bld: 97 mg/dL (ref 70–99)
Potassium: 3 mmol/L — ABNORMAL LOW (ref 3.5–5.1)
Sodium: 139 mmol/L (ref 135–145)

## 2020-08-01 LAB — BRAIN NATRIURETIC PEPTIDE: B Natriuretic Peptide: 123.4 pg/mL — ABNORMAL HIGH (ref 0.0–100.0)

## 2020-08-01 LAB — TROPONIN I (HIGH SENSITIVITY)
Troponin I (High Sensitivity): 13 ng/L (ref <18)
Troponin I (High Sensitivity): 16 ng/L (ref <18)

## 2020-08-01 NOTE — ED Notes (Signed)
Pt seen walking to cab and leaving.

## 2020-08-01 NOTE — ED Notes (Signed)
Pt requested IV be taken out because she wants to leave

## 2020-08-09 ENCOUNTER — Encounter (HOSPITAL_COMMUNITY): Payer: Self-pay

## 2020-08-09 NOTE — Progress Notes (Signed)
Paramedicine Encounter    Patient ID: [name], female    DOB: 01/14/1951, 70 y.o.   MRN: [id number]  After speaking to patient and HF clinic staff, patient is officially discharged from paramedicine.   Patient is now discharged from [employer].  Patient has/has not met the following goals:  Yes :Patient expresses basic understanding of medications and what they are for Yes :Patient able to verbalize heart failure specific dietary/fluid restrictions Yes :Patient is aware of who to call if they have medical concerns or if they need to schedule or change appts Yes :Patient has a scale for daily weights and weighs regularly Yes :Patient able to verbalize concerning symptoms when they should call the HF clinic (weight gain ranges, etc) Yes :Patient has a PCP and has seen within the past year or has upcoming appt Yes :Patient has reliable access to getting their medications Yes :Patient has shown they are able to reorder medications reliably Yes :Patient has had admission in past 30 days- if yes how many? No :Patient has had admission in past 90 days- if yes how many?  Discharge Comments:  Discharge discussed with patient and clinic staff. Patient unhappy with decision but stated she no longer needs assistance from HF Paramedic.     ACTION: Home visit completed

## 2020-08-13 ENCOUNTER — Other Ambulatory Visit (HOSPITAL_COMMUNITY): Payer: Self-pay

## 2020-08-14 NOTE — Progress Notes (Signed)
Patient ID: [name], female   DOB: [birth year], 70 y.o.   MRN: [id number]   Advanced Heart Failure Clinic Note   PCP: Dr. Alyson Ingles Cardiology: Dr Dora Sims is a 70 y.o. female with history of nonischemic cardiomyopathy.  She was admitted with CHF exacerbation in 02/2012.  EF 20-25% on echo, LHC with nonobstructive CAD.  She was started on cardiac meds and discharged.  In 07/2012, she was admitted again with CHF exacerbation.  She had run out of Lasix.  She was taking her other heart medications as ordered, however.  She was diuresed and discharged. She had a chronic LBBB, and Medtronic CRT-D device was placed in 10/14.  She was admitted in 6/16 with hypertensive emergency and CHF exacerbation.   Also of note, she had PFTs in 9/16 showing restrictive spirometry with low lung volume and DLCO.  She was supposed to get a high resolution CT to evaluate for interstitial lung disease but never had the study.     Admitted March 2018 with urosepsis--> E Coli Bacteremia. Completed antibiotic course. EF was down from previous to 30-35%. Also had atrial fibrillation so she was loaded on amiodarone. Placed on eliquis. Discharge weight was 208 pounds.  She is now off amiodarone.   Admitted to [facility] 1/16 -> 02/13/16 with CP and dizziness. Found to be in Afib. Converted spontaneously overnight with BB and diuresis.   Echo 11/19 showed EF 35-40%.  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    Patient had COVID-19 infection in 10/20.   Echo in 11/20 showed EF 40-45% with mild LVH and mildly decreased RV systolic function.  Echo was done today and reviewed, EF up to 55-60%, moderate LVH, normal RV.   She returned 2/22 for followup of CHF.  Symptomatically doing well.  No significant exertional dyspnea.  No chest pain.  She does get sleepy during the day.  She has a hard time sleeping at night as she says that she is up multiple times urinating.   Weight is stable.   Today she returns for HF follow up. Overall feeling fine. Denies increasing SOB, CP, dizziness, edema, or PND/Orthopnea. Appetite ok. No fever or chills. Weight at home 2400 pounds. Taking all medications. She is upset she was discharged from Paramedicine and does not know why.  Medtronic device interrogation: Stable thoracic impedance, daily activity ~2 hrs, no VT/VF.      ECG (personally reviewed): SR v-paced.  Labs (2/14): SPEP negative, UPEP negative, HIV negative Labs (04/14/2017): K 3.8 Creatinine 0.94  Labs (9/19): K 3.3, creatinine 0.86 Labs (1/20): K 4, creatinine 0.87 Labs (2/20): K 4.2, creatinine 1.16 Labs (8/20): K 3.8, creatinine 0.78 Labs (11/20): LDL 104 Labs (12/20): K 3.6, creatinine 0.93 Labs (7/21): K 3.5, creatinine 1.0 Labs (12/21): K 3.6, creatinine 1.05, hgbA1c 8 Labs (4/22): K 3.1, creatinine 1.01 Labs (7/22): K 4.2, creatinine 0.99  PMH: 1. HTN 2. Type II diabetes with neuropathy 3. Nonischemic cardiomyopathy: ? Due to HTN versus LBBB CMP.  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  Echo (2/14) with EF 20-25%.  Echo (7/14) with EF 25%, diffuse hypokinesis.  HIV, SPEP, UPEP negative.  Has LBBB. CRT-D 10/2012 (Medtronic).  Echo (4/15) with EF 45-50%, mild diffuse hypokinesis, PA systolic pressure 38 mmHg.  Echo (6/16) with EF 40-45%, mild LVH, septal and inferior hypokinesis.   - Echo (3/18): EF 30-35%. Grade 1 DD - Echo (6/18): EF 45-50%, moderate LVH, normal  RV size with mildly decreased systolic function.  - Echo (11/19): EF 35-40%, moderate LVH, moderate diastolic dysfunction, normal RV size with mildly decreased systolic function, PASP 43 mmHg.  - Echo (11/20): EF 40-45%, mild LVH, mildly decreased RV systolic function.  - Echo (2/22): EF 55-60%, moderate LVH, normal RV.  4. Chronic LBBB 5. Right TKR 6. Bilateral THR.  7. Hyperlipidemia 8. ?COPD: Has oxygen for use with exertion.  - PFTs (9/16) with FEV1 77%, FVC 76%, ratio 101%, TLC 63%,  DLCO 41% => moderate restrictive deficit  9. Atrial fibrillation: Paroxysmal.  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  11. COVID-19 infection in 10/20.  12. OSA: CPAP.  SH: Prior smoker, quit 2/14.  Never drank ETOH.  No drugs. Lives with son.   FH: Mother with "heart trouble."   Review of systems complete and found to be negative unless listed in HPI.    Current Outpatient Medications  Medication Sig Dispense Refill   albuterol (VENTOLIN HFA) 108 (90 Base) MCG/ACT inhaler Inhale 2 puffs into the lungs every 6 (six) hours as needed for wheezing or shortness of breath. 18 g 0   atorvastatin (LIPITOR) 40 MG tablet TAKE 1 TABLET(40 MG) BY MOUTH DAILY 90 tablet 1   baclofen (LIORESAL) 10 MG tablet      carvedilol (COREG) 25 MG tablet TAKE 1 TABLET(25 MG) BY MOUTH TWICE DAILY WITH A MEAL 180 tablet 1   ELIQUIS 5 MG TABS tablet TAKE 1 TABLET(5 MG) BY MOUTH TWICE DAILY 60 tablet 1   furosemide (LASIX) 20 MG tablet Take 3 tablets (60 mg total) by mouth daily. 270 tablet 3   gabapentin (NEURONTIN) 100 MG capsule TK 1 C PO TID     glucose blood (FREESTYLE TEST STRIPS) test strip Use as instructed 100 each 0   glucose monitoring kit (FREESTYLE) monitoring kit 1 each by Does not apply route 4 (four) times daily - after meals and at bedtime. 1 month Diabetic Testing Supplies for QAC-QHS accuchecks. 1 each 1   HUMALOG KWIKPEN 100 UNIT/ML KwikPen Sliding scale     insulin glargine (LANTUS) 100 UNIT/ML Solostar Pen Inject 60 Units into the skin at bedtime.     Insulin Pen Needle 32G X 8 MM MISC Use as directed 100 each 0   isosorbide-hydrALAZINE (BIDIL) 20-37.5 MG tablet Take 2 tablets by mouth 3 (three) times daily. 180 tablet 6   Lancets (FREESTYLE) lancets Use as instructed 100 each 0   metFORMIN (GLUCOPHAGE) 1000 MG tablet Take 1,000 mg by mouth 2 (two) times daily with a meal.     ondansetron (ZOFRAN) 4  MG tablet Take 1 tablet (4 mg total) by mouth every 6 (six) hours. 12 tablet 0   Oxycodone HCl 10 MG TABS Take 10 mg by mouth 3 (three) times daily as needed.     potassium chloride SA (KLOR-CON) 20 MEQ tablet TAKE 3 TABLETS(60 MEQ) BY MOUTH DAILY 270 tablet 3   sacubitril-valsartan (ENTRESTO) 97-103 MG Take 1 tablet by mouth 2 (two) times daily. 180 tablet 3   spironolactone (ALDACTONE) 25 MG tablet TAKE 1 TABLET(25 MG) BY MOUTH DAILY 90 tablet 3   Tafamidis 61 MG CAPS TAKE 1 CAPSULE (61 MG) BY MOUTH DAILY. 90 capsule 3   No current facility-administered medications for this encounter.   BP 132/76   Pulse 96   Wt 108.9 kg (240 lb)   SpO2 97%  BMI 37.59 kg/m   Wt Readings from Last 3 Encounters:  08/15/20 108.9 kg (240 lb)  07/04/20 110.7 kg (244 lb)  05/23/20 107.5 kg (237 lb)    Physical exam General:  NAD. No resp difficulty HEENT: Normal Neck: Supple. No JVD. Carotids 2+ bilat; no bruits. No lymphadenopathy or thryomegaly appreciated. Cor: PMI nondisplaced. Regular rate & rhythm. No rubs, gallops or murmurs. Lungs: Clear Abdomen: Soft, nontender, nondistended. No hepatosplenomegaly. No bruits or masses. Good bowel sounds. Extremities: No cyanosis, clubbing, rash, edema Neuro: Alert & oriented x 3, cranial nerves grossly intact. Moves all 4 extremities w/o difficulty. Affect pleasant.  Assessment/Plan: 1. Chronic systolic CHF: Nonischemic cardiomyopathy, thought to be related to HTN. S/P CRT-D (Medtronic). Echo (2/22) EF improved up to 55-60%. She is not volume overloaded by exam or Optivol.  - Continue Coreg 25 mg bid.  - Continue Entresto 97/103 mg bid. - Continue Bidil 2 tabs tid. - Continue spiro 25 mg daily. BMET today.  - Continue Lasix 60 mg daily. 2. Hyperlipidemia: Continue statin.  3. HTN: BP now controlled.  4. PAF: No prolonged episodes.    - Continue Eliquis for anticoagulation. Recent CBC (7/22) ok. 5. Cardiac amyloidosis: PYP scan strongly suggestive of  transthyretin cardiac amyloidosis. Genetic testing was negative (wild type).  - Continue tafamidis.  6. OSA: Strongly suspect OSA, was diagnosed in the remote past but does not have CPAP.  - Needs sleep study.  7. Noncompliance: Has been discharged from paramedicine. She was offered bubble packs but declined. - Will have her prescriptions switched to [facility] for bubble packs and set up for delivery to help with medication compliance.  Followup in 3 months with Dr. Loistine Simas, FNP  08/15/2020

## 2020-08-15 ENCOUNTER — Other Ambulatory Visit: Payer: Self-pay

## 2020-08-15 ENCOUNTER — Encounter (HOSPITAL_COMMUNITY): Payer: Self-pay

## 2020-08-15 ENCOUNTER — Ambulatory Visit (HOSPITAL_COMMUNITY)
Admission: RE | Admit: 2020-08-15 | Discharge: 2020-08-15 | Disposition: A | Discharge status: Home / Self Care | Payer: Medicare Other | Source: Ambulatory Visit | Attending: Family Medicine | Admitting: Family Medicine

## 2020-08-15 VITALS — BP 132/76 | HR 96 | Wt 240.0 lb

## 2020-08-15 DIAGNOSIS — Z9119 Patient's noncompliance with other medical treatment and regimen: Secondary | ICD-10-CM | POA: Diagnosis not present

## 2020-08-15 DIAGNOSIS — I480 Paroxysmal atrial fibrillation: Secondary | ICD-10-CM

## 2020-08-15 DIAGNOSIS — I110 Hypertensive heart disease with heart failure: Secondary | ICD-10-CM | POA: Insufficient documentation

## 2020-08-15 DIAGNOSIS — Z91148 Patient's other noncompliance with medication regimen for other reason: Secondary | ICD-10-CM

## 2020-08-15 DIAGNOSIS — Z8616 Personal history of COVID-19: Secondary | ICD-10-CM | POA: Diagnosis not present

## 2020-08-15 DIAGNOSIS — E854 Organ-limited amyloidosis: Secondary | ICD-10-CM

## 2020-08-15 DIAGNOSIS — E785 Hyperlipidemia, unspecified: Secondary | ICD-10-CM | POA: Diagnosis not present

## 2020-08-15 DIAGNOSIS — I428 Other cardiomyopathies: Secondary | ICD-10-CM | POA: Insufficient documentation

## 2020-08-15 DIAGNOSIS — I5022 Chronic systolic (congestive) heart failure: Secondary | ICD-10-CM

## 2020-08-15 DIAGNOSIS — Z7901 Long term (current) use of anticoagulants: Secondary | ICD-10-CM | POA: Diagnosis not present

## 2020-08-15 DIAGNOSIS — G4733 Obstructive sleep apnea (adult) (pediatric): Secondary | ICD-10-CM | POA: Diagnosis not present

## 2020-08-15 DIAGNOSIS — I10 Essential (primary) hypertension: Secondary | ICD-10-CM | POA: Diagnosis not present

## 2020-08-15 DIAGNOSIS — Z87891 Personal history of nicotine dependence: Secondary | ICD-10-CM | POA: Insufficient documentation

## 2020-08-15 DIAGNOSIS — G4730 Sleep apnea, unspecified: Secondary | ICD-10-CM

## 2020-08-15 DIAGNOSIS — Z9114 Patient's other noncompliance with medication regimen: Secondary | ICD-10-CM | POA: Insufficient documentation

## 2020-08-15 DIAGNOSIS — Z79899 Other long term (current) drug therapy: Secondary | ICD-10-CM | POA: Diagnosis not present

## 2020-08-15 DIAGNOSIS — I43 Cardiomyopathy in diseases classified elsewhere: Secondary | ICD-10-CM

## 2020-08-15 DIAGNOSIS — Z794 Long term (current) use of insulin: Secondary | ICD-10-CM | POA: Diagnosis not present

## 2020-08-15 LAB — BASIC METABOLIC PANEL
Anion gap: 7 (ref 5–15)
BUN: 18 mg/dL (ref 8–23)
CO2: 29 mmol/L (ref 22–32)
Calcium: 9.3 mg/dL (ref 8.9–10.3)
Chloride: 99 mmol/L (ref 98–111)
Creatinine, Ser: 0.99 mg/dL (ref 0.44–1.00)
GFR, Estimated: >60 mL/min (ref >60)
Glucose, Bld: 312 mg/dL — ABNORMAL HIGH (ref 70–99)
Potassium: 4.2 mmol/L (ref 3.5–5.1)
Sodium: 135 mmol/L (ref 135–145)

## 2020-08-15 MED ORDER — CARVEDILOL 25 MG PO TABS: ORAL_TABLET | ORAL | 1 refills | Status: DC

## 2020-08-15 MED ORDER — POTASSIUM CHLORIDE CRYS ER 10 MEQ PO TBCR: 60.0000 meq | EXTENDED_RELEASE_TABLET | Freq: Every day | ORAL | 3 refills | Status: DC

## 2020-08-15 MED ORDER — ISOSORB DINITRATE-HYDRALAZINE 20-37.5 MG PO TABS: 2.0000 | ORAL_TABLET | Freq: Three times a day (TID) | ORAL | 6 refills | Status: DC

## 2020-08-15 MED ORDER — ENTRESTO 97-103 MG PO TABS: 1.0000 | ORAL_TABLET | Freq: Two times a day (BID) | ORAL | 3 refills | Status: DC

## 2020-08-15 MED ORDER — APIXABAN 5 MG PO TABS: ORAL_TABLET | ORAL | 3 refills | Status: DC

## 2020-08-15 MED ORDER — SPIRONOLACTONE 25 MG PO TABS: ORAL_TABLET | ORAL | 3 refills | Status: DC

## 2020-08-15 MED ORDER — FUROSEMIDE 20 MG PO TABS
60.0000 mg | ORAL_TABLET | Freq: Every day | ORAL | 3 refills | Status: DC
Start: 2020-08-15 — End: 2021-10-07

## 2020-08-15 NOTE — Patient Instructions (Signed)
It was great to see you today! No medication changes are needed at this time.  Labs today We will only contact you if something comes back abnormal or we need to make some changes. Otherwise no news is good news!  Your physician recommends that you schedule a follow-up appointment in: 3-4 months with Dr McLean  Do the following things EVERYDAY: Weigh yourself in the morning before breakfast. Write it down and keep it in a log. Take your medicines as prescribed Eat low salt foods--Limit salt (sodium) to 2000 mg per day.  Stay as active as you can everyday Limit all fluids for the day to less than 2 liters  milAt the Advanced [facility] Failure Clinic, you and your health needs are our priority. As part of our continuing mission to provide you with exceptional heart care, we have created designated Provider Care Teams. These Care Teams include your primary Cardiologist (physician) and Advanced Practice Providers (APPs- Physician Assistants and Nurse Practitioners) who all work together to provide you with the care you need, when you need it.   You may see any of the following providers on your designated Care Team at your next follow up: Dr Daniel Bensimhon Dr Dalton McLean Dr Brandon Winfrey Amy Clegg, NP Brittainy Simmons, PA Jessica Milford,NP Lauren Kemp, PharmD   Please be sure to bring in all your medications bottles to every appointment.    

## 2020-08-31 ENCOUNTER — Other Ambulatory Visit: Payer: Self-pay

## 2020-08-31 MED ORDER — APIXABAN 5 MG PO TABS: ORAL_TABLET | ORAL | 2 refills | Status: DC

## 2020-08-31 NOTE — Telephone Encounter (Signed)
Faxed refill request received from [facility].  Pt last saw Allena Katz, NP on 08/15/20, last labs 08/15/20 Creat 0.99, age 70, weight 108.9kg, based on specified criteria pt is on appropriate dosage of Eliquis '5mg'$  BID.  Will refill rx.

## 2020-09-10 ENCOUNTER — Ambulatory Visit: Payer: Medicare Other | Attending: Nurse Practitioner | Admitting: Physical Therapy

## 2020-09-11 ENCOUNTER — Other Ambulatory Visit (HOSPITAL_COMMUNITY): Payer: Self-pay

## 2020-09-20 ENCOUNTER — Other Ambulatory Visit (HOSPITAL_COMMUNITY): Payer: Self-pay

## 2020-09-20 MED FILL — Tafamidis Cap 61 MG: ORAL | 30 days supply | Qty: 30 | Fill #3 | Status: AC

## 2020-10-11 ENCOUNTER — Other Ambulatory Visit (HOSPITAL_COMMUNITY): Payer: Self-pay

## 2020-10-11 ENCOUNTER — Other Ambulatory Visit (HOSPITAL_COMMUNITY): Payer: Self-pay | Admitting: Cardiology

## 2020-10-11 MED ORDER — VYNDAMAX 61 MG PO CAPS
ORAL_CAPSULE | ORAL | 3 refills | Status: DC
Start: 2020-10-11 — End: 2021-10-14
  Filled 2020-10-11: qty 30, 30d supply, fill #0
  Filled 2020-11-19: qty 30, 30d supply, fill #1
  Filled 2020-12-13: qty 30, 30d supply, fill #2
  Filled 2021-01-10: qty 30, 30d supply, fill #3
  Filled 2021-02-25: qty 30, 30d supply, fill #4
  Filled 2021-03-18: qty 30, 30d supply, fill #5
  Filled 2021-04-12: qty 30, 30d supply, fill #6
  Filled 2021-05-16: qty 30, 30d supply, fill #7
  Filled 2021-06-14: qty 30, 30d supply, fill #8
  Filled 2021-07-29: qty 30, 30d supply, fill #9
  Filled 2021-09-18: qty 30, 30d supply, fill #10

## 2020-10-17 ENCOUNTER — Other Ambulatory Visit (HOSPITAL_COMMUNITY): Payer: Self-pay

## 2020-10-30 ENCOUNTER — Encounter: Payer: Self-pay | Admitting: Internal Medicine

## 2020-10-30 ENCOUNTER — Other Ambulatory Visit: Payer: Self-pay

## 2020-10-30 ENCOUNTER — Ambulatory Visit (INDEPENDENT_AMBULATORY_CARE_PROVIDER_SITE_OTHER): Payer: Medicare Other | Admitting: Internal Medicine

## 2020-10-30 VITALS — BP 140/70 | HR 94 | Ht 67.0 in | Wt 245.0 lb

## 2020-10-30 DIAGNOSIS — I428 Other cardiomyopathies: Secondary | ICD-10-CM | POA: Diagnosis not present

## 2020-10-30 DIAGNOSIS — Z95810 Presence of automatic (implantable) cardiac defibrillator: Secondary | ICD-10-CM | POA: Diagnosis not present

## 2020-10-30 NOTE — H&P (View-Only) (Signed)
HPI [name] returns today for followup of her biV ICD and chronic systolic heart failure. She had a nice improvement of her EF after BIV ICD insertion. In the interim, she has done well. No ICD shocks.  No PND and no orthopnea. She is approaching ERI on her device. She has not been able to lose weight.  Allergies  Allergen Reactions   Morphine And Related Nausea Only    Severe nausea   Penicillins Other (See Comments)    Pt states she has had a pain in her leg since a penicillin injection 2 months ago (reported 08/31/17).  Has tolerated amoxicillin oral.  Has patient had a PCN reaction causing immediate rash, facial/tongue/throat swelling, SOB or lightheadedness with hypotension: No Has patient had a PCN reaction causing severe rash involving mucus membranes or skin necrosis: No Has patient had a PCN reaction that required hospitalization: No Has patient had a PCN reaction occurring within the last 10 years: Yes If a     Current Outpatient Medications  Medication Sig Dispense Refill   albuterol (VENTOLIN HFA) 108 (90 Base) MCG/ACT inhaler Inhale 2 puffs into the lungs every 6 (six) hours as needed for wheezing or shortness of breath. 18 g 0   apixaban (ELIQUIS) 5 MG TABS tablet TAKE 1 TABLET(5 MG) BY MOUTH TWICE DAILY 180 tablet 2   atorvastatin (LIPITOR) 40 MG tablet TAKE 1 TABLET(40 MG) BY MOUTH DAILY 90 tablet 1   baclofen (LIORESAL) 10 MG tablet      carvedilol (COREG) 25 MG tablet TAKE 1 TABLET(25 MG) BY MOUTH TWICE DAILY WITH A MEAL 180 tablet 1   furosemide (LASIX) 20 MG tablet Take 3 tablets (60 mg total) by mouth daily. 270 tablet 3   gabapentin (NEURONTIN) 100 MG capsule TK 1 C PO TID     glucose blood (FREESTYLE TEST STRIPS) test strip Use as instructed 100 each 0   glucose monitoring kit (FREESTYLE) monitoring kit 1 each by Does not apply route 4 (four) times daily - after meals and at bedtime. 1 month Diabetic Testing Supplies for QAC-QHS accuchecks. 1 each 1    HUMALOG KWIKPEN 100 UNIT/ML KwikPen Sliding scale     insulin glargine (LANTUS) 100 UNIT/ML Solostar Pen Inject 60 Units into the skin at bedtime.     Insulin Pen Needle 32G X 8 MM MISC Use as directed 100 each 0   isosorbide-hydrALAZINE (BIDIL) 20-37.5 MG tablet Take 2 tablets by mouth 3 (three) times daily. 180 tablet 6   Lancets (FREESTYLE) lancets Use as instructed 100 each 0   metFORMIN (GLUCOPHAGE) 1000 MG tablet Take 1,000 mg by mouth 2 (two) times daily with a meal.     ondansetron (ZOFRAN) 4 MG tablet Take 1 tablet (4 mg total) by mouth every 6 (six) hours. 12 tablet 0   Oxycodone HCl 10 MG TABS Take 10 mg by mouth 3 (three) times daily as needed.     potassium chloride SA (KLOR-CON) 10 MEQ tablet Take 6 tablets (60 mEq total) by mouth daily. 540 tablet 3   sacubitril-valsartan (ENTRESTO) 97-103 MG Take 1 tablet by mouth 2 (two) times daily. 180 tablet 3   spironolactone (ALDACTONE) 25 MG tablet TAKE 1 TABLET(25 MG) BY MOUTH DAILY 90 tablet 3   Tafamidis (VYNDAMAX) 61 MG CAPS TAKE 1 CAPSULE (61 MG) BY MOUTH DAILY. 90 capsule 3   No current facility-administered medications for this visit.     Past Medical History:  Diagnosis Date  Anemia    a. Noted on 07/2012 labs, instructed to f/u PCP.   Arthritis    "joints" (11/18/2012)   CAD (coronary artery disease), native coronary artery    a. Nonobstructive by cath 02/2012 (done because of low EF).   Chronic bronchitis ([place])    "~ every other year" (11/18/2012)   Chronic combined systolic and diastolic CHF (congestive heart failure) ([place])    a. 03/05/12 echo:  LVEF 20-25%, moderate LVH , inferior and basal to mid septal akinesis, anterior moderate to severe hypokinesis and grade 2 diastolic dysfunction. b. EF 07/2012: EF still 25% (unclear medication compliance).   Chronic lower back pain    Headache(784.0)    "often; maybe not daily" (11/18/2012)   High cholesterol    History of noncompliance with medical treatment    Hypertension     LBBB (left bundle branch block)    Orthopnea    Tobacco abuse    Type II diabetes mellitus ([name])     ROS:   All systems reviewed and negative except as noted in the HPI.   Past Surgical History:  Procedure Laterality Date   BI-VENTRICULAR IMPLANTABLE CARDIOVERTER DEFIBRILLATOR N/A 11/18/2012   Procedure: BI-VENTRICULAR IMPLANTABLE CARDIOVERTER DEFIBRILLATOR  (CRT-D);  Surgeon: Evans Lance, MD;  Location: [facility] CATH LAB;  Service: Cardiovascular;  Laterality: N/A;   BI-VENTRICULAR IMPLANTABLE CARDIOVERTER DEFIBRILLATOR  (CRT-D)  11/18/2012   CARDIAC CATHETERIZATION  03/04/12   nonobstructive CAD, elevated LVEDP and tortuous vessels suggestive of long-standing hypertension   COLONOSCOPY WITH PROPOFOL N/A 12/12/2016   Procedure: COLONOSCOPY WITH PROPOFOL;  Surgeon: Carol Ada, MD;  Location: [facility] ENDOSCOPY;  Service: Endoscopy;  Laterality: N/A;   JOINT REPLACEMENT     Bilateral hip and right knee   LEFT HEART CATH N/A 03/05/2012   Procedure: LEFT HEART CATH;  Surgeon: Larey Dresser, MD;  Location: [facility] CATH LAB;  Service: Cardiovascular;  Laterality: N/A;     Family History  Problem Relation Age of Onset   Heart failure Mother    Heart disease Neg Hx      Social History   Socioeconomic History   Marital status: Single    Spouse name: Not on file   Number of children: 4   Years of education: 15   Highest education level: Not on file  Occupational History   Not on file  Tobacco Use   Smoking status: Former    Packs/day: 1.00    Years: 40.00    Pack years: 40.00    Types: Cigarettes    Start date: 06/23/1972    Quit date: 03/26/2012    Years since quitting: 8.6   Smokeless tobacco: Never  Vaping Use   Vaping Use: Never used  Substance and Sexual Activity   Alcohol use: No   Drug use: No   Sexual activity: Yes  Other Topics Concern   Not on file  Social History Narrative   Not on file   Social Determinants of Health   Financial Resource Strain: Not on file   Food Insecurity: Not on file  Transportation Needs: Not on file  Physical Activity: Not on file  Stress: Not on file  Social Connections: Not on file  Intimate Partner Violence: Not on file     BP 140/70 (BP Location: Left Arm, Patient Position: Sitting, Cuff Size: Normal)   Pulse 94   Ht _0  (1.702 m)   Wt 245 lb (111.1 kg)   SpO2 (!) 86%   BMI 38.37 kg/m  Physical Exam:  Well appearing 70 yo woman, NAD HEENT: Unremarkable Neck:  6 cm JVD, no thyromegally Lymphatics:  No adenopathy Back:  No CVA tenderness Lungs:  Clear with no wheezes HEART:  Regular rate rhythm, no murmurs, no rubs, no clicks Abd:  soft, positive bowel sounds, no organomegally, no rebound, no guarding Ext:  2 plus pulses, no edema, no cyanosis, no clubbing Skin:  No rashes no nodules Neuro:  CN II through XII intact, motor grossly intact  DEVICE  Normal device function.  See PaceArt for details. Approaching ERI.   Assess/Plan:  1. Chronic systolic heart failure - she appears to be doing well and her EF has improved after BiV insertion. She will continue her current meds.  2. HTN - her blood pressure is well controlled.  3. ICD - her medtronic BiV ICD is working normally. She is approaching ERI.  4. Dyslipidemia - she will continue her lipitor.    Mikle Bosworth.D

## 2020-10-30 NOTE — Patient Instructions (Signed)
Medication Instructions:  Your physician recommends that you continue on your current medications as directed. Please refer to the Current Medication list given to you today.  Labwork: None ordered.  Testing/Procedures: None ordered.  Follow-Up: Your physician wants you to follow-up in: one year with Gregg Taylor, MD or one of the following Advanced Practice Providers on your designated Care Team:   Renee Ursuy, PA-C Michael "Andy" Tillery, PA-C  Remote monitoring is used to monitor your ICD from home. This monitoring reduces the number of office visits required to check your device to one time per year. It allows us to keep an eye on the functioning of your device to ensure it is working properly. You are scheduled for a device check from home on 11/15/2020. You may send your transmission at any time that day. If you have a wireless device, the transmission will be sent automatically. After your physician reviews your transmission, you will receive a postcard with your next transmission date.  Any Other Special Instructions Will Be Listed Below (If Applicable).  If you need a refill on your cardiac medications before your next appointment, please call your pharmacy.      

## 2020-10-30 NOTE — Progress Notes (Signed)
HPI [name] returns today for followup of her biV ICD and chronic systolic heart failure. She had a nice improvement of her EF after BIV ICD insertion. In the interim, she has done well. No ICD shocks.  No PND and no orthopnea. She is approaching ERI on her device. She has not been able to lose weight.  Allergies  Allergen Reactions   Morphine And Related Nausea Only    Severe nausea   Penicillins Other (See Comments)    Pt states she has had a pain in her leg since a penicillin injection 2 months ago (reported 08/31/17).  Has tolerated amoxicillin oral.  Has patient had a PCN reaction causing immediate rash, facial/tongue/throat swelling, SOB or lightheadedness with hypotension: No Has patient had a PCN reaction causing severe rash involving mucus membranes or skin necrosis: No Has patient had a PCN reaction that required hospitalization: No Has patient had a PCN reaction occurring within the last 10 years: Yes If a     Current Outpatient Medications  Medication Sig Dispense Refill   albuterol (VENTOLIN HFA) 108 (90 Base) MCG/ACT inhaler Inhale 2 puffs into the lungs every 6 (six) hours as needed for wheezing or shortness of breath. 18 g 0   apixaban (ELIQUIS) 5 MG TABS tablet TAKE 1 TABLET(5 MG) BY MOUTH TWICE DAILY 180 tablet 2   atorvastatin (LIPITOR) 40 MG tablet TAKE 1 TABLET(40 MG) BY MOUTH DAILY 90 tablet 1   baclofen (LIORESAL) 10 MG tablet      carvedilol (COREG) 25 MG tablet TAKE 1 TABLET(25 MG) BY MOUTH TWICE DAILY WITH A MEAL 180 tablet 1   furosemide (LASIX) 20 MG tablet Take 3 tablets (60 mg total) by mouth daily. 270 tablet 3   gabapentin (NEURONTIN) 100 MG capsule TK 1 C PO TID     glucose blood (FREESTYLE TEST STRIPS) test strip Use as instructed 100 each 0   glucose monitoring kit (FREESTYLE) monitoring kit 1 each by Does not apply route 4 (four) times daily - after meals and at bedtime. 1 month Diabetic Testing Supplies for QAC-QHS accuchecks. 1 each 1    HUMALOG KWIKPEN 100 UNIT/ML KwikPen Sliding scale     insulin glargine (LANTUS) 100 UNIT/ML Solostar Pen Inject 60 Units into the skin at bedtime.     Insulin Pen Needle 32G X 8 MM MISC Use as directed 100 each 0   isosorbide-hydrALAZINE (BIDIL) 20-37.5 MG tablet Take 2 tablets by mouth 3 (three) times daily. 180 tablet 6   Lancets (FREESTYLE) lancets Use as instructed 100 each 0   metFORMIN (GLUCOPHAGE) 1000 MG tablet Take 1,000 mg by mouth 2 (two) times daily with a meal.     ondansetron (ZOFRAN) 4 MG tablet Take 1 tablet (4 mg total) by mouth every 6 (six) hours. 12 tablet 0   Oxycodone HCl 10 MG TABS Take 10 mg by mouth 3 (three) times daily as needed.     potassium chloride SA (KLOR-CON) 10 MEQ tablet Take 6 tablets (60 mEq total) by mouth daily. 540 tablet 3   sacubitril-valsartan (ENTRESTO) 97-103 MG Take 1 tablet by mouth 2 (two) times daily. 180 tablet 3   spironolactone (ALDACTONE) 25 MG tablet TAKE 1 TABLET(25 MG) BY MOUTH DAILY 90 tablet 3   Tafamidis (VYNDAMAX) 61 MG CAPS TAKE 1 CAPSULE (61 MG) BY MOUTH DAILY. 90 capsule 3   No current facility-administered medications for this visit.     Past Medical History:  Diagnosis Date  Anemia    a. Noted on 07/2012 labs, instructed to f/u PCP.   Arthritis    "joints" (11/18/2012)   CAD (coronary artery disease), native coronary artery    a. Nonobstructive by cath 02/2012 (done because of low EF).   Chronic bronchitis ([place])    "~ every other year" (11/18/2012)   Chronic combined systolic and diastolic CHF (congestive heart failure) ([place])    a. 03/05/12 echo:  LVEF 20-25%, moderate LVH , inferior and basal to mid septal akinesis, anterior moderate to severe hypokinesis and grade 2 diastolic dysfunction. b. EF 07/2012: EF still 25% (unclear medication compliance).   Chronic lower back pain    Headache(784.0)    "often; maybe not daily" (11/18/2012)   High cholesterol    History of noncompliance with medical treatment    Hypertension     LBBB (left bundle branch block)    Orthopnea    Tobacco abuse    Type II diabetes mellitus ([name])     ROS:   All systems reviewed and negative except as noted in the HPI.   Past Surgical History:  Procedure Laterality Date   BI-VENTRICULAR IMPLANTABLE CARDIOVERTER DEFIBRILLATOR N/A 11/18/2012   Procedure: BI-VENTRICULAR IMPLANTABLE CARDIOVERTER DEFIBRILLATOR  (CRT-D);  Surgeon: Evans Lance, MD;  Location: [facility] CATH LAB;  Service: Cardiovascular;  Laterality: N/A;   BI-VENTRICULAR IMPLANTABLE CARDIOVERTER DEFIBRILLATOR  (CRT-D)  11/18/2012   CARDIAC CATHETERIZATION  03/04/12   nonobstructive CAD, elevated LVEDP and tortuous vessels suggestive of long-standing hypertension   COLONOSCOPY WITH PROPOFOL N/A 12/12/2016   Procedure: COLONOSCOPY WITH PROPOFOL;  Surgeon: Carol Ada, MD;  Location: [facility] ENDOSCOPY;  Service: Endoscopy;  Laterality: N/A;   JOINT REPLACEMENT     Bilateral hip and right knee   LEFT HEART CATH N/A 03/05/2012   Procedure: LEFT HEART CATH;  Surgeon: Larey Dresser, MD;  Location: [facility] CATH LAB;  Service: Cardiovascular;  Laterality: N/A;     Family History  Problem Relation Age of Onset   Heart failure Mother    Heart disease Neg Hx      Social History   Socioeconomic History   Marital status: Single    Spouse name: Not on file   Number of children: 4   Years of education: 15   Highest education level: Not on file  Occupational History   Not on file  Tobacco Use   Smoking status: Former    Packs/day: 1.00    Years: 40.00    Pack years: 40.00    Types: Cigarettes    Start date: 06/23/1972    Quit date: 03/26/2012    Years since quitting: 8.6   Smokeless tobacco: Never  Vaping Use   Vaping Use: Never used  Substance and Sexual Activity   Alcohol use: No   Drug use: No   Sexual activity: Yes  Other Topics Concern   Not on file  Social History Narrative   Not on file   Social Determinants of Health   Financial Resource Strain: Not on file   Food Insecurity: Not on file  Transportation Needs: Not on file  Physical Activity: Not on file  Stress: Not on file  Social Connections: Not on file  Intimate Partner Violence: Not on file     BP 140/70 (BP Location: Left Arm, Patient Position: Sitting, Cuff Size: Normal)   Pulse 94   Ht _0  (1.702 m)   Wt 245 lb (111.1 kg)   SpO2 (!) 86%   BMI 38.37 kg/m  Physical Exam:  Well appearing 70 yo woman, NAD HEENT: Unremarkable Neck:  6 cm JVD, no thyromegally Lymphatics:  No adenopathy Back:  No CVA tenderness Lungs:  Clear with no wheezes HEART:  Regular rate rhythm, no murmurs, no rubs, no clicks Abd:  soft, positive bowel sounds, no organomegally, no rebound, no guarding Ext:  2 plus pulses, no edema, no cyanosis, no clubbing Skin:  No rashes no nodules Neuro:  CN II through XII intact, motor grossly intact  DEVICE  Normal device function.  See PaceArt for details. Approaching ERI.   Assess/Plan:  1. Chronic systolic heart failure - she appears to be doing well and her EF has improved after BiV insertion. She will continue her current meds.  2. HTN - her blood pressure is well controlled.  3. ICD - her medtronic BiV ICD is working normally. She is approaching ERI.  4. Dyslipidemia - she will continue her lipitor.    Mikle Bosworth.D

## 2020-11-15 ENCOUNTER — Ambulatory Visit (INDEPENDENT_AMBULATORY_CARE_PROVIDER_SITE_OTHER): Payer: Medicare Other

## 2020-11-15 ENCOUNTER — Other Ambulatory Visit (HOSPITAL_COMMUNITY): Payer: Self-pay

## 2020-11-15 DIAGNOSIS — I428 Other cardiomyopathies: Secondary | ICD-10-CM

## 2020-11-15 LAB — CUP PACEART REMOTE DEVICE CHECK
Battery Remaining Longevity: 1 mo
Battery Voltage: 2.72 V
Brady Statistic AP VP Percent: 0.01 %
Brady Statistic AP VS Percent: 0 %
Brady Statistic AS VP Percent: 98.87 %
Brady Statistic AS VS Percent: 1.12 %
Brady Statistic RA Percent Paced: 0.01 %
Brady Statistic RV Percent Paced: 40.73 %
Date Time Interrogation Session: 20221020012403
HighPow Impedance: 68 Ohm
Implantable Lead Implant Date: 20141023
Implantable Lead Implant Date: 20141023
Implantable Lead Implant Date: 20141023
Implantable Lead Location: 753858
Implantable Lead Location: 753859
Implantable Lead Location: 753860
Implantable Lead Model: 4298
Implantable Lead Model: 5076
Implantable Lead Model: 6935
Implantable Pulse Generator Implant Date: 20141023
Lead Channel Impedance Value: 361 Ohm
Lead Channel Impedance Value: 361 Ohm
Lead Channel Impedance Value: 399 Ohm
Lead Channel Impedance Value: 418 Ohm
Lead Channel Impedance Value: 418 Ohm
Lead Channel Impedance Value: 475 Ohm
Lead Channel Impedance Value: 513 Ohm
Lead Channel Impedance Value: 513 Ohm
Lead Channel Impedance Value: 608 Ohm
Lead Channel Impedance Value: 646 Ohm
Lead Channel Impedance Value: 646 Ohm
Lead Channel Impedance Value: 665 Ohm
Lead Channel Impedance Value: 703 Ohm
Lead Channel Pacing Threshold Amplitude: 0.5 V
Lead Channel Pacing Threshold Amplitude: 0.5 V
Lead Channel Pacing Threshold Amplitude: 1.125 V
Lead Channel Pacing Threshold Pulse Width: 0.4 ms
Lead Channel Pacing Threshold Pulse Width: 0.4 ms
Lead Channel Pacing Threshold Pulse Width: 0.4 ms
Lead Channel Sensing Intrinsic Amplitude: 11.25 mV
Lead Channel Sensing Intrinsic Amplitude: 11.25 mV
Lead Channel Sensing Intrinsic Amplitude: 2.375 mV
Lead Channel Sensing Intrinsic Amplitude: 2.375 mV
Lead Channel Setting Pacing Amplitude: 1.5 V
Lead Channel Setting Pacing Amplitude: 2 V
Lead Channel Setting Pacing Amplitude: 2.5 V
Lead Channel Setting Pacing Pulse Width: 0.4 ms
Lead Channel Setting Pacing Pulse Width: 0.4 ms
Lead Channel Setting Sensing Sensitivity: 0.45 mV

## 2020-11-19 ENCOUNTER — Telehealth: Payer: Self-pay

## 2020-11-19 ENCOUNTER — Other Ambulatory Visit (HOSPITAL_COMMUNITY): Payer: Self-pay

## 2020-11-19 NOTE — Telephone Encounter (Signed)
MDT alert. Device has reached RRT 11/17/20. Routing to triage. LH  Attempted to contact patient to advise. No answer, LMTCB.   Also, [name] does patient need to come back into clinic to discuss gen change? She was seen in office 10/30/20 and you noted device was near RRT.

## 2020-11-20 ENCOUNTER — Telehealth: Payer: Self-pay

## 2020-11-20 DIAGNOSIS — Z95810 Presence of automatic (implantable) cardiac defibrillator: Secondary | ICD-10-CM

## 2020-11-20 DIAGNOSIS — I428 Other cardiomyopathies: Secondary | ICD-10-CM

## 2020-11-20 NOTE — Telephone Encounter (Signed)
Outreach made to Pt to advise she was ERI.  Pt is scheduled for gen change on November 22, 2020 at 10:00 am  Will get lab work and instruction letter/soap October 26.  Pt is aware to NOT taking any more doses of her Eliquis prior to her procedure.  Work up complete

## 2020-11-21 ENCOUNTER — Other Ambulatory Visit: Payer: Self-pay

## 2020-11-21 ENCOUNTER — Telehealth: Payer: Self-pay | Admitting: *Deleted

## 2020-11-21 ENCOUNTER — Other Ambulatory Visit: Payer: Medicare Other | Admitting: *Deleted

## 2020-11-21 DIAGNOSIS — I428 Other cardiomyopathies: Secondary | ICD-10-CM

## 2020-11-21 DIAGNOSIS — Z95810 Presence of automatic (implantable) cardiac defibrillator: Secondary | ICD-10-CM

## 2020-11-21 LAB — CBC WITH DIFFERENTIAL/PLATELET
Basophils Absolute: 0 10*3/uL (ref 0.0–0.2)
Basos: 1 %
EOS (ABSOLUTE): 0.2 10*3/uL (ref 0.0–0.4)
Eos: 3 %
Hematocrit: 36.4 % (ref 34.0–46.6)
Hemoglobin: 12.2 g/dL (ref 11.1–15.9)
Lymphocytes Absolute: 2.2 10*3/uL (ref 0.7–3.1)
Lymphs: 28 %
MCH: 30.5 pg (ref 26.6–33.0)
MCHC: 33.5 g/dL (ref 31.5–35.7)
MCV: 91 fL (ref 79–97)
Monocytes Absolute: 0.6 10*3/uL (ref 0.1–0.9)
Monocytes: 8 %
Neutrophils Absolute: 4.9 10*3/uL (ref 1.4–7.0)
Neutrophils: 60 %
Platelets: 377 10*3/uL (ref 150–450)
RBC: 4 x10E6/uL (ref 3.77–5.28)
RDW: 13.4 % (ref 11.7–15.4)
WBC: 8 10*3/uL (ref 3.4–10.8)

## 2020-11-21 LAB — BASIC METABOLIC PANEL
BUN/Creatinine Ratio: 24 (ref 12–28)
BUN: 24 mg/dL (ref 8–27)
CO2: 28 mmol/L (ref 20–29)
Calcium: 9.4 mg/dL (ref 8.7–10.3)
Chloride: 102 mmol/L (ref 96–106)
Creatinine, Ser: 0.98 mg/dL (ref 0.57–1.00)
Glucose: 223 mg/dL — ABNORMAL HIGH (ref 70–99)
Potassium: 4.7 mmol/L (ref 3.5–5.2)
Sodium: 138 mmol/L (ref 134–144)
eGFR: 63 mL/min/{1.73_m2} (ref >59)

## 2020-11-21 MED ORDER — ENTRESTO 97-103 MG PO TABS: 1.0000 | ORAL_TABLET | Freq: Two times a day (BID) | ORAL | 3 refills | Status: DC

## 2020-11-21 MED ORDER — CARVEDILOL 25 MG PO TABS: ORAL_TABLET | ORAL | 1 refills | Status: DC

## 2020-11-21 MED ORDER — VANCOMYCIN HCL 1500 MG/300ML IV SOLN
1500.0000 mg | INTRAVENOUS | Status: AC
  Administered 2020-11-22: 1500 mg via INTRAVENOUS
  Filled 2020-11-21 (×2): qty 300

## 2020-11-21 NOTE — Telephone Encounter (Signed)
Pt called to ask clarification on her pre med regimen for procedure.  Per Pt-she is not taking carvedilol and Entresto.  Not sure if not taking or just did not refill.  Sent refill to pharmacy  Advised Pt to not take any medication on day of procedure.

## 2020-11-21 NOTE — Pre-Procedure Instructions (Signed)
Attempted to call patient regarding procedure instructions.  No answer 

## 2020-11-21 NOTE — Telephone Encounter (Signed)
   Patient Name: [name]  DOB: 1950-05-10 MRN: [id number]  Primary Cardiologist: Ep - Dr .Lovena Le   Chart reviewed as part of pre-operative protocol coverage. Per records, patient is actually undergoing BiV-ICD generator change tomorrow by [name]. I will route to [name] to find out whether this impacts timing of when she may proceed with her dental extraction of 1 tooth. Normally would follow usual protocol but undergoing EP procedure tomorrow. [name] - Please route response to P CV DIV PREOP (the pre-op pool). Thank you.  (Preliminarily, no SBE needs identified at this time.)  Charlie Pitter, PA-C 11/21/2020, 5:17 PM

## 2020-11-21 NOTE — Telephone Encounter (Signed)
I s/w the dental office and confirmed procedure for the pt. I advised that we cannot provide a blanket type clearance and that we will need a clearance for each procedure to be done. See clearance below for this time being.     Pre-operative Risk Assessment    Patient Name: [name]  DOB: Apr 13, 1950 MRN: [id number]     Request for Surgical Clearance   Procedure:  Dental Extraction - No. of Teeth:  1  Date of Surgery: Clearance TBD                               Surgeon:  DR. FADIRA Surgeon's Group or Practice Name:  [facility] Phone number:  [phone] Fax number:  [phone]   Type of Clearance Requested: - Medical  - Pharmacy:  Hold Apixaban (Eliquis)     Type of Anesthesia:   Local    Additional requests/questions:   Jiles Prows   11/21/2020, 4:31 PM

## 2020-11-21 NOTE — Addendum Note (Signed)
Addended by: Willeen Cass A on: 11/21/2020 02:30 PM   Modules accepted: Orders

## 2020-11-22 ENCOUNTER — Other Ambulatory Visit: Payer: Self-pay

## 2020-11-22 ENCOUNTER — Encounter (HOSPITAL_COMMUNITY)
Admission: RE | Disposition: A | Discharge status: Home / Self Care | Payer: Self-pay | Source: Home / Self Care | Attending: Internal Medicine

## 2020-11-22 ENCOUNTER — Encounter (HOSPITAL_COMMUNITY): Payer: Self-pay | Admitting: Internal Medicine

## 2020-11-22 ENCOUNTER — Ambulatory Visit (HOSPITAL_COMMUNITY)
Admission: RE | Admit: 2020-11-22 | Discharge: 2020-11-22 | Disposition: A | Discharge status: Home / Self Care | Payer: Medicare Other | Attending: Internal Medicine | Admitting: Internal Medicine

## 2020-11-22 DIAGNOSIS — I5022 Chronic systolic (congestive) heart failure: Secondary | ICD-10-CM | POA: Insufficient documentation

## 2020-11-22 DIAGNOSIS — Z4502 Encounter for adjustment and management of automatic implantable cardiac defibrillator: Secondary | ICD-10-CM | POA: Insufficient documentation

## 2020-11-22 DIAGNOSIS — Z79899 Other long term (current) drug therapy: Secondary | ICD-10-CM | POA: Diagnosis not present

## 2020-11-22 DIAGNOSIS — I110 Hypertensive heart disease with heart failure: Secondary | ICD-10-CM | POA: Diagnosis not present

## 2020-11-22 DIAGNOSIS — Z880 Allergy status to penicillin: Secondary | ICD-10-CM | POA: Diagnosis not present

## 2020-11-22 DIAGNOSIS — Z7984 Long term (current) use of oral hypoglycemic drugs: Secondary | ICD-10-CM | POA: Diagnosis not present

## 2020-11-22 DIAGNOSIS — E7800 Pure hypercholesterolemia, unspecified: Secondary | ICD-10-CM | POA: Diagnosis not present

## 2020-11-22 DIAGNOSIS — I447 Left bundle-branch block, unspecified: Secondary | ICD-10-CM | POA: Insufficient documentation

## 2020-11-22 DIAGNOSIS — Z794 Long term (current) use of insulin: Secondary | ICD-10-CM | POA: Insufficient documentation

## 2020-11-22 DIAGNOSIS — Z7901 Long term (current) use of anticoagulants: Secondary | ICD-10-CM | POA: Insufficient documentation

## 2020-11-22 DIAGNOSIS — Z87891 Personal history of nicotine dependence: Secondary | ICD-10-CM | POA: Insufficient documentation

## 2020-11-22 DIAGNOSIS — Z885 Allergy status to narcotic agent status: Secondary | ICD-10-CM | POA: Diagnosis not present

## 2020-11-22 DIAGNOSIS — Z8249 Family history of ischemic heart disease and other diseases of the circulatory system: Secondary | ICD-10-CM | POA: Insufficient documentation

## 2020-11-22 HISTORY — PX: BIV ICD GENERATOR CHANGEOUT: EP1194

## 2020-11-22 LAB — GLUCOSE, CAPILLARY: Glucose-Capillary: 157 mg/dL — ABNORMAL HIGH (ref 70–99)

## 2020-11-22 SURGERY — BIV ICD GENERATOR CHANGEOUT

## 2020-11-22 MED ORDER — FENTANYL CITRATE (PF) 100 MCG/2ML IJ SOLN
INTRAMUSCULAR | Status: AC
  Filled 2020-11-22: qty 2

## 2020-11-22 MED ORDER — VANCOMYCIN HCL IN DEXTROSE 1-5 GM/200ML-% IV SOLN
INTRAVENOUS | Status: AC
  Filled 2020-11-22: qty 200

## 2020-11-22 MED ORDER — ACETAMINOPHEN 325 MG PO TABS
325.0000 mg | ORAL_TABLET | ORAL | Status: DC | PRN
  Filled 2020-11-22: qty 2

## 2020-11-22 MED ORDER — MIDAZOLAM HCL 5 MG/5ML IJ SOLN
INTRAMUSCULAR | Status: DC | PRN
  Administered 2020-11-22: 1 mg via INTRAVENOUS

## 2020-11-22 MED ORDER — POVIDONE-IODINE 10 % EX SWAB: 2.0000 "application " | Freq: Once | CUTANEOUS | Status: DC

## 2020-11-22 MED ORDER — SODIUM CHLORIDE 0.9 % IV SOLN
INTRAVENOUS | Status: AC
  Filled 2020-11-22: qty 2

## 2020-11-22 MED ORDER — HEPARIN (PORCINE) IN NACL 1000-0.9 UT/500ML-% IV SOLN
INTRAVENOUS | Status: AC
  Filled 2020-11-22: qty 500

## 2020-11-22 MED ORDER — MIDAZOLAM HCL 5 MG/5ML IJ SOLN
INTRAMUSCULAR | Status: AC
  Filled 2020-11-22: qty 5

## 2020-11-22 MED ORDER — SODIUM CHLORIDE 0.9 % IV SOLN
80.0000 mg | INTRAVENOUS | Status: AC
  Administered 2020-11-22: 80 mg
  Filled 2020-11-22: qty 2

## 2020-11-22 MED ORDER — CHLORHEXIDINE GLUCONATE 4 % EX LIQD: 4.0000 "application " | Freq: Once | CUTANEOUS | Status: DC

## 2020-11-22 MED ORDER — FENTANYL CITRATE (PF) 100 MCG/2ML IJ SOLN
INTRAMUSCULAR | Status: DC | PRN
  Administered 2020-11-22: 12.5 ug via INTRAVENOUS

## 2020-11-22 MED ORDER — LIDOCAINE HCL (PF) 1 % IJ SOLN
INTRAMUSCULAR | Status: DC | PRN
  Administered 2020-11-22: 60 mL

## 2020-11-22 MED ORDER — SODIUM CHLORIDE 0.9 % IV SOLN: INTRAVENOUS | Status: DC

## 2020-11-22 MED ORDER — LIDOCAINE HCL (PF) 1 % IJ SOLN
INTRAMUSCULAR | Status: AC
  Filled 2020-11-22: qty 60

## 2020-11-22 MED ORDER — ONDANSETRON HCL 4 MG/2ML IJ SOLN: 4.0000 mg | Freq: Four times a day (QID) | INTRAMUSCULAR | Status: DC | PRN

## 2020-11-22 SURGICAL SUPPLY — 4 items
CABLE SURGICAL S-101-97-12 (CABLE) ×2 IMPLANT
ICD CLARIA MRI DTMA1Q1 (ICD Generator) ×1 IMPLANT
PAD PRO RADIOLUCENT 2001M-C (PAD) ×2 IMPLANT
TRAY PACEMAKER INSERTION (PACKS) ×2 IMPLANT

## 2020-11-22 NOTE — Discharge Instructions (Signed)

## 2020-11-22 NOTE — Interval H&P Note (Signed)
History and Physical Interval Note:  11/22/2020 7:39 AM  [name]  has presented today for surgery, with the diagnosis of eri.  The various methods of treatment have been discussed with the patient and family. After consideration of risks, benefits and other options for treatment, the patient has consented to  Procedure(s): BIV ICD [place] (N/A) as a surgical intervention.  The patient's history has been reviewed, patient examined, no change in status, stable for surgery.  I have reviewed the patient's chart and labs.  Questions were answered to the patient's satisfaction.     Cristopher Peru

## 2020-11-23 MED FILL — Heparin Sod (Porcine)-NaCl IV Soln 1000 Unit/500ML-0.9%: INTRAVENOUS | Qty: 500 | Status: AC

## 2020-11-23 NOTE — Progress Notes (Signed)
Remote ICD transmission.   

## 2020-11-25 NOTE — Telephone Encounter (Signed)
Noted.GT 

## 2020-11-27 ENCOUNTER — Telehealth: Payer: Self-pay

## 2020-11-27 NOTE — Telephone Encounter (Signed)
I spoke with the patient daughter and asked her to have the patient give me a call back. I need a manual transmission from her monitor.

## 2020-11-27 NOTE — Telephone Encounter (Signed)
Remote transmission received and reviewed. Normal device function. Patient called and advised. Appreciative of follow up call.

## 2020-11-27 NOTE — Telephone Encounter (Signed)
The patient agreed to send a manual transmission when she get home.

## 2020-11-27 NOTE — Telephone Encounter (Signed)
   Patient Name: [name]  DOB: January 31, 1950 MRN: [id number]  Primary Cardiologist: Cristopher Peru, MD  Chart reviewed as part of pre-operative protocol coverage.   Simple dental extractions are considered low risk procedures per guidelines and generally do not require any specific cardiac clearance. It is also generally accepted that for simple extractions and dental cleanings, there is no need to interrupt blood thinner therapy.  Pt had a BiV-ICD generator change on 11/22/20. We request waiting 6 weeks from this procedure before having dental work and extractions completed.   SBE prophylaxis is not required for the patient from a cardiac standpoint.  I will route this recommendation to the requesting party via Epic fax function and remove from pre-op pool.  Please call with questions.  Tami Lin Markia Kyer, PA 11/27/2020, 9:13 AM

## 2020-11-27 NOTE — Telephone Encounter (Signed)
Transmission received 11/27/2020

## 2020-12-05 ENCOUNTER — Other Ambulatory Visit: Payer: Self-pay

## 2020-12-05 ENCOUNTER — Ambulatory Visit (INDEPENDENT_AMBULATORY_CARE_PROVIDER_SITE_OTHER): Payer: Medicare Other

## 2020-12-05 DIAGNOSIS — I428 Other cardiomyopathies: Secondary | ICD-10-CM

## 2020-12-05 LAB — CUP PACEART INCLINIC DEVICE CHECK
Battery Remaining Longevity: 103 mo
Battery Voltage: 3.03 V
Brady Statistic AP VP Percent: 0.02 %
Brady Statistic AP VS Percent: 0 %
Brady Statistic AS VP Percent: 98.84 %
Brady Statistic AS VS Percent: 1.14 %
Brady Statistic RA Percent Paced: 0.02 %
Brady Statistic RV Percent Paced: 27.93 %
Date Time Interrogation Session: 20221109195738
HighPow Impedance: 53 Ohm
Implantable Lead Implant Date: 20141023
Implantable Lead Implant Date: 20141023
Implantable Lead Implant Date: 20141023
Implantable Lead Location: 753858
Implantable Lead Location: 753859
Implantable Lead Location: 753860
Implantable Lead Model: 4298
Implantable Lead Model: 5076
Implantable Lead Model: 6935
Implantable Pulse Generator Implant Date: 20221027
Lead Channel Impedance Value: 171 Ohm
Lead Channel Impedance Value: 171 Ohm
Lead Channel Impedance Value: 171 Ohm
Lead Channel Impedance Value: 175.622
Lead Channel Impedance Value: 175.622
Lead Channel Impedance Value: 342 Ohm
Lead Channel Impedance Value: 342 Ohm
Lead Channel Impedance Value: 342 Ohm
Lead Channel Impedance Value: 361 Ohm
Lead Channel Impedance Value: 399 Ohm
Lead Channel Impedance Value: 418 Ohm
Lead Channel Impedance Value: 456 Ohm
Lead Channel Impedance Value: 456 Ohm
Lead Channel Impedance Value: 551 Ohm
Lead Channel Impedance Value: 551 Ohm
Lead Channel Impedance Value: 589 Ohm
Lead Channel Impedance Value: 608 Ohm
Lead Channel Impedance Value: 608 Ohm
Lead Channel Pacing Threshold Amplitude: 0.5 V
Lead Channel Pacing Threshold Amplitude: 0.5 V
Lead Channel Pacing Threshold Amplitude: 1.25 V
Lead Channel Pacing Threshold Pulse Width: 0.4 ms
Lead Channel Pacing Threshold Pulse Width: 0.4 ms
Lead Channel Pacing Threshold Pulse Width: 0.4 ms
Lead Channel Sensing Intrinsic Amplitude: 15.625 mV
Lead Channel Sensing Intrinsic Amplitude: 3.25 mV
Lead Channel Setting Pacing Amplitude: 1.5 V
Lead Channel Setting Pacing Amplitude: 2 V
Lead Channel Setting Pacing Amplitude: 2.5 V
Lead Channel Setting Pacing Pulse Width: 0.4 ms
Lead Channel Setting Pacing Pulse Width: 0.4 ms
Lead Channel Setting Sensing Sensitivity: 0.45 mV

## 2020-12-05 NOTE — Patient Instructions (Signed)
   After Your ICD (Implantable Cardiac Defibrillator)    Monitor your defibrillator site for redness, swelling, and drainage. Call the device clinic at [phone] if you experience these symptoms or fever/chills.  Your incision was closed with Steri-strips or staples:  You may shower 7 days after your procedure and wash your incision with soap and water. Avoid lotions, ointments, or perfumes over your incision until it is well-healed.    You may use a hot tub or a pool after your wound check appointment if the incision is completely closed.  Do not lift, push or pull greater than 10 pounds with the affected arm until 6 weeks after your procedure. There are no other restrictions in arm movement after your wound check appointment.  Your ICD is MRI compatible.  Your ICD is designed to protect you from life threatening heart rhythms. Because of this, you may receive a shock.   1 shock with no symptoms:  Call the office during business hours. 1 shock with symptoms (chest pain, chest pressure, dizziness, lightheadedness, shortness of breath, overall feeling unwell):  Call 911. If you experience 2 or more shocks in 24 hours:  Call 911. If you receive a shock, you should not drive.  [place] DMV - no driving for 6 months if you receive appropriate therapy from your ICD.   ICD Alerts:  Some alerts are vibratory and others beep. These are NOT emergencies. Please call our office to let us know. If this occurs at night or on weekends, it can wait until the next business day. Send a remote transmission.  If your device is capable of reading fluid status (for heart failure), you will be offered monthly monitoring to review this with you.   Remote monitoring is used to monitor your ICD from home. This monitoring is scheduled every 91 days by our office. It allows us to keep an eye on the functioning of your device to ensure it is working properly. You will routinely see your Electrophysiologist annually  (more often if necessary).   

## 2020-12-05 NOTE — Progress Notes (Signed)
Wound check appointment s/p CRT-D gen change 11/22/20. Steri-strips removed. Wound without redness or edema. Incision edges approximated, wound well healed. Normal device function. Thresholds, sensing, and impedances consistent with implant measurements. Device programmed for chronic lead settings. Histogram distribution appropriate for patient and level of activity. No mode switches or ventricular arrhythmias noted. Patient educated about wound care, arm mobility, lifting restrictions, shock plan. Patient enrolled in remote monitoring. 91 day follow up with [name] 02/28/21.

## 2020-12-13 ENCOUNTER — Other Ambulatory Visit (HOSPITAL_COMMUNITY): Payer: Self-pay

## 2020-12-17 ENCOUNTER — Other Ambulatory Visit (HOSPITAL_COMMUNITY): Payer: Self-pay

## 2021-01-03 ENCOUNTER — Other Ambulatory Visit (HOSPITAL_COMMUNITY): Payer: Self-pay | Admitting: Family Medicine

## 2021-01-03 NOTE — Progress Notes (Deleted)
OUTPATIENT PHYSICAL THERAPY THORACOLUMBAR EVALUATION   Patient Name: [name] MRN: [id number] DOB:07/23/1950, 70 y.o., female Today's Date: 01/03/2021    Past Medical History:  Diagnosis Date   Anemia    a. Noted on 07/2012 labs, instructed to f/u PCP.   Arthritis    "joints" (11/18/2012)   CAD (coronary artery disease), native coronary artery    a. Nonobstructive by cath 02/2012 (done because of low EF).   Chronic bronchitis ([place])    "~ every other year" (11/18/2012)   Chronic combined systolic and diastolic CHF (congestive heart failure) ([place])    a. 03/05/12 echo:  LVEF 20-25%, moderate LVH , inferior and basal to mid septal akinesis, anterior moderate to severe hypokinesis and grade 2 diastolic dysfunction. b. EF 07/2012: EF still 25% (unclear medication compliance).   Chronic lower back pain    Headache(784.0)    "often; maybe not daily" (11/18/2012)   High cholesterol    History of noncompliance with medical treatment    Hypertension    LBBB (left bundle branch block)    Orthopnea    Tobacco abuse    Type II diabetes mellitus ([place])    Past Surgical History:  Procedure Laterality Date   BI-VENTRICULAR IMPLANTABLE CARDIOVERTER DEFIBRILLATOR N/A 11/18/2012   Procedure: BI-VENTRICULAR IMPLANTABLE CARDIOVERTER DEFIBRILLATOR  (CRT-D);  Surgeon: Evans Lance, MD;  Location: [facility] CATH LAB;  Service: Cardiovascular;  Laterality: N/A;   BI-VENTRICULAR IMPLANTABLE CARDIOVERTER DEFIBRILLATOR  (CRT-D)  11/18/2012   BIV ICD GENERATOR CHANGEOUT N/A 11/22/2020   Procedure: BIV ICD GENERATOR CHANGEOUT;  Surgeon: Evans Lance, MD;  Location: [place] CV LAB;  Service: Cardiovascular;  Laterality: N/A;   CARDIAC CATHETERIZATION  03/04/12   nonobstructive CAD, elevated LVEDP and tortuous vessels suggestive of long-standing hypertension   COLONOSCOPY WITH PROPOFOL N/A 12/12/2016   Procedure: COLONOSCOPY WITH PROPOFOL;  Surgeon: Carol Ada, MD;  Location: [facility] ENDOSCOPY;  Service:  Endoscopy;  Laterality: N/A;   JOINT REPLACEMENT     Bilateral hip and right knee   LEFT HEART CATH N/A 03/05/2012   Procedure: LEFT HEART CATH;  Surgeon: Larey Dresser, MD;  Location: [facility] CATH LAB;  Service: Cardiovascular;  Laterality: N/A;   Patient Active Problem List   Diagnosis Date Noted   Right foot infection 10/22/2017   Cellulitis in diabetic foot ([place]) 10/22/2017   Hyperglycemia 10/22/2017   Lumbar radiculopathy 09/18/2017   Degenerative spondylolisthesis 09/18/2017   Atrial fibrillation ([place]) 02/11/2017   Paroxysmal A-fib ([facility])    Biventricular automatic implantable cardioverter defibrillator in situ    Sepsis ([place]) 04/20/2016   UTI (urinary tract infection) 04/20/2016   Dyspnea 07/19/2015   Interstitial lung disease ([place]) 11/20/2014   SIRS (systemic inflammatory response syndrome) ([place]) 02/03/2014   IDDM (insulin dependent diabetes mellitus) 02/03/2014   Tachycardia 95/62/1308   Chronic systolic CHF (congestive heart failure) ([place]) 09/29/2012   Hypoxia 03/06/2012   Hypertensive heart disease 03/06/2012   Nonischemic cardiomyopathy ([place]) 03/06/2012   Tobacco abuse 03/06/2012   Type 2 diabetes mellitus ([place]) 03/06/2012   Hypertension 03/06/2012   CAD (coronary artery disease), native coronary artery 03/06/2012   Hypokalemia 03/06/2012   Acute bronchitis 02/01/2009   Sleep apnea 02/01/2009   Chest pain 02/01/2009    PCP: Sandi Mariscal, MD  REFERRING PROVIDER: Jeanella Anton, NP  REFERRING DIAG: ***  THERAPY DIAG:  No diagnosis found.  ONSET DATE: ***  SUBJECTIVE:  SUBJECTIVE STATEMENT: *** PERTINENT HISTORY:  ***  PAIN:  Are you having pain? {yes/no:20286} VAS scale: ***/10 Pain location: *** Pain orientation: {Pain Orientation:25161}  PAIN TYPE: {type:313116} Pain  description: {PAIN DESCRIPTION:21022940}  Aggravating factors: *** Relieving factors: ***  PRECAUTIONS: {Therapy precautions:24002}  WEIGHT BEARING RESTRICTIONS {Yes ***/No:24003}  FALLS:  Has patient fallen in last 6 months? {yes/no:20286}, Number of falls: ***  LIVING ENVIRONMENT: Lives with: {[facility] lives with:25569::"lives with their family"} Lives in: {Lives in:25570} Stairs: {yes/no:20286}; {Stairs:24000} Has following equipment at home: {Assistive devices:23999}  OCCUPATION: ***  PLOF: {PLOF:24004}  PATIENT GOALS ***   OBJECTIVE:   DIAGNOSTIC FINDINGS:  ***  PATIENT SURVEYS:  {rehab surveys:24030}  SCREENING FOR RED FLAGS: Bowel or bladder incontinence: {Yes/No:304960894} Spinal tumors: {Yes/No:304960894} Cauda equina syndrome: {Yes/No:304960894} Compression fracture: {Yes/No:304960894} Abdominal aneurysm: {Yes/No:304960894}  COGNITION:  Overall cognitive status: {cognition:24006}     SENSATION:  Light touch: {intact/deficits:24005}  Stereognosis: {intact/deficits:24005}  Hot/Cold: {intact/deficits:24005}  Proprioception: {intact/deficits:24005}  MUSCLE LENGTH: Hamstrings: Right *** deg; Left *** deg Thomas test: Right *** deg; Left *** deg  POSTURE:  ***  LUMBARAROM/PROM  A/PROM A/PROM  01/03/2021  Flexion   Extension   Right lateral flexion   Left lateral flexion   Right rotation   Left rotation    (Blank rows = not tested)  LE AROM/PROM:  A/PROM Right 01/03/2021 Left 01/03/2021  Hip flexion    Hip extension    Hip abduction    Hip adduction    Hip internal rotation    Hip external rotation    Knee flexion    Knee extension    Ankle dorsiflexion    Ankle plantarflexion    Ankle inversion    Ankle eversion     (Blank rows = not tested)  LE MMT:  MMT Right 01/03/2021 Left 01/03/2021  Hip flexion    Hip extension    Hip abduction    Hip adduction    Hip internal rotation    Hip external rotation    Knee flexion    Knee  extension    Ankle dorsiflexion    Ankle plantarflexion    Ankle inversion    Ankle eversion     (Blank rows = not tested)  LUMBAR SPECIAL TESTS:  {lumbar special test:25242}  SPINAL SEGMENTAL MOBILITY ASSESSMENT:  ***  FUNCTIONAL TESTS:  {Functional tests:24029}  GAIT: Distance walked: *** Assistive device utilized: {Assistive devices:23999} Level of assistance: {Levels of assistance:24026} Comments: ***    TODAY'S TREATMENT  ***   PATIENT EDUCATION:  Education details: *** Person educated: {Person educated:25204} Education method: {Education Method:25205} Education comprehension: {Education Comprehension:25206}   HOME EXERCISE PROGRAM: ***  ASSESSMENT:  CLINICAL IMPRESSION: Patient is a *** y.o. *** who was seen today for physical therapy evaluation and treatment for ***. Objective impairments include {opptimpairments:25111}. These impairments are limiting patient from {activity limitations:25113}. Personal factors including {Personal factors:25162} are also affecting patient's functional outcome. Patient will benefit from skilled PT to address above impairments and improve overall function.  REHAB POTENTIAL: {rehabpotential:25112}  CLINICAL DECISION MAKING: {clinical decision making:25114}  EVALUATION COMPLEXITY: {Evaluation complexity:25115}   GOALS: Goals reviewed with patient? {yes/no:20286}  SHORT TERM GOALS:  STG Name Target Date Goal status  1 *** Baseline:  01/17/2021 {GOALSTATUS:25110}  2 *** Baseline:  {follow up:25551} {GOALSTATUS:25110}  3 *** Baseline: {follow up:25551} {GOALSTATUS:25110}  4 *** Baseline: {follow up:25551} {GOALSTATUS:25110}  5 *** Baseline: {follow up:25551} {GOALSTATUS:25110}  6 *** Baseline: {follow up:25551} {GOALSTATUS:25110}  7 *** Baseline: {follow up:25551} {GOALSTATUS:25110}  LONG TERM GOALS:   LTG Name Target Date Goal status  1 *** Baseline: {follow up:25551} {GOALSTATUS:25110}  2 *** Baseline:  {follow up:25551} {GOALSTATUS:25110}  3 *** Baseline: {follow up:25551} {GOALSTATUS:25110}  4 *** Baseline: {follow up:25551} {GOALSTATUS:25110}  5 *** Baseline: {follow up:25551} {GOALSTATUS:25110}  6 *** Baseline: {follow up:25551} {GOALSTATUS:25110}  7 *** Baseline: {follow up:25551} {GOALSTATUS:25110}   PLAN: PT FREQUENCY: {rehab frequency:25116}  PT DURATION: {rehab duration:25117}  PLANNED INTERVENTIONS: {rehab planned interventions:25118::"Therapeutic exercises","Therapeutic activity","Neuro Muscular re-education","Balance training","Gait training","Patient/Family education","Joint mobilization"}  PLAN FOR NEXT SESSION: ***   Lanice Shirts 01/03/2021, 1:07 PM

## 2021-01-04 ENCOUNTER — Ambulatory Visit: Payer: Medicare Other

## 2021-01-05 ENCOUNTER — Ambulatory Visit: Payer: Medicare Other | Attending: Nurse Practitioner

## 2021-01-05 ENCOUNTER — Other Ambulatory Visit: Payer: Self-pay

## 2021-01-05 DIAGNOSIS — R262 Difficulty in walking, not elsewhere classified: Secondary | ICD-10-CM | POA: Diagnosis present

## 2021-01-05 DIAGNOSIS — M5442 Lumbago with sciatica, left side: Secondary | ICD-10-CM | POA: Diagnosis present

## 2021-01-05 DIAGNOSIS — M6281 Muscle weakness (generalized): Secondary | ICD-10-CM | POA: Insufficient documentation

## 2021-01-05 DIAGNOSIS — Z9181 History of falling: Secondary | ICD-10-CM | POA: Diagnosis present

## 2021-01-05 DIAGNOSIS — M5386 Other specified dorsopathies, lumbar region: Secondary | ICD-10-CM | POA: Insufficient documentation

## 2021-01-05 DIAGNOSIS — G8929 Other chronic pain: Secondary | ICD-10-CM | POA: Diagnosis present

## 2021-01-05 NOTE — Therapy (Signed)
OUTPATIENT PHYSICAL THERAPY LOWER EXTREMITY EVALUATION   Patient Name: [name] MRN: [id number] DOB:[birth year], 70 y.o., female Today's Date: 01/06/2021   PT End of Session - 01/06/21 2108     Visit Number 1    Number of Visits 17    Date for PT Re-Evaluation 03/09/21    Authorization Type [employer] MEDICARE    Authorization Time Period Reassess FOTO on 6th and 11th visits    Progress Note Due on Visit 10    PT Start Time 0900    PT Stop Time 0945    PT Time Calculation (min) 45 min    Activity Tolerance Patient limited by pain    Behavior During Therapy [facility] for tasks assessed/performed             Past Medical History:  Diagnosis Date   Anemia    a. Noted on 07/2012 labs, instructed to f/u PCP.   Arthritis    "joints" (11/18/2012)   CAD (coronary artery disease), native coronary artery    a. Nonobstructive by cath 02/2012 (done because of low EF).   Chronic bronchitis ([place])    "~ every other year" (11/18/2012)   Chronic combined systolic and diastolic CHF (congestive heart failure) ([place])    a. 03/05/12 echo:  LVEF 20-25%, moderate LVH , inferior and basal to mid septal akinesis, anterior moderate to severe hypokinesis and grade 2 diastolic dysfunction. b. EF 07/2012: EF still 25% (unclear medication compliance).   Chronic lower back pain    Headache(784.0)    "often; maybe not daily" (11/18/2012)   High cholesterol    History of noncompliance with medical treatment    Hypertension    LBBB (left bundle branch block)    Orthopnea    Tobacco abuse    Type II diabetes mellitus ([place])    Past Surgical History:  Procedure Laterality Date   BI-VENTRICULAR IMPLANTABLE CARDIOVERTER DEFIBRILLATOR N/A 11/18/2012   Procedure: BI-VENTRICULAR IMPLANTABLE CARDIOVERTER DEFIBRILLATOR  (CRT-D);  Surgeon: Evans Lance, MD;  Location: [facility] CATH LAB;  Service: Cardiovascular;  Laterality: N/A;   BI-VENTRICULAR IMPLANTABLE CARDIOVERTER DEFIBRILLATOR  (CRT-D)  11/18/2012   BIV ICD  GENERATOR CHANGEOUT N/A 11/22/2020   Procedure: BIV ICD GENERATOR CHANGEOUT;  Surgeon: Evans Lance, MD;  Location: [place] CV LAB;  Service: Cardiovascular;  Laterality: N/A;   CARDIAC CATHETERIZATION  03/04/12   nonobstructive CAD, elevated LVEDP and tortuous vessels suggestive of long-standing hypertension   COLONOSCOPY WITH PROPOFOL N/A 12/12/2016   Procedure: COLONOSCOPY WITH PROPOFOL;  Surgeon: Carol Ada, MD;  Location: [facility] ENDOSCOPY;  Service: Endoscopy;  Laterality: N/A;   JOINT REPLACEMENT     Bilateral hip and right knee   LEFT HEART CATH N/A 03/05/2012   Procedure: LEFT HEART CATH;  Surgeon: Larey Dresser, MD;  Location: [facility] CATH LAB;  Service: Cardiovascular;  Laterality: N/A;   Patient Active Problem List   Diagnosis Date Noted   Right foot infection 10/22/2017   Cellulitis in diabetic foot ([place]) 10/22/2017   Hyperglycemia 10/22/2017   Lumbar radiculopathy 09/18/2017   Degenerative spondylolisthesis 09/18/2017   Atrial fibrillation ([place]) 02/11/2017   Paroxysmal A-fib (HCC)    Biventricular automatic implantable cardioverter defibrillator in situ    Sepsis ([place]) 04/20/2016   UTI (urinary tract infection) 04/20/2016   Dyspnea 07/19/2015   Interstitial lung disease ([place]) 11/20/2014   SIRS (systemic inflammatory response syndrome) ([place]) 02/03/2014   IDDM (insulin dependent diabetes mellitus) 02/03/2014   Tachycardia 97/35/3299   Chronic systolic CHF (congestive heart  failure) ([place]) 09/29/2012   Hypoxia 03/06/2012   Hypertensive heart disease 03/06/2012   Nonischemic cardiomyopathy ([place]) 03/06/2012   Tobacco abuse 03/06/2012   Type 2 diabetes mellitus ([place]) 03/06/2012   Hypertension 03/06/2012   CAD (coronary artery disease), native coronary artery 03/06/2012   Hypokalemia 03/06/2012   Acute bronchitis 02/01/2009   Sleep apnea 02/01/2009   Chest pain 02/01/2009    PCP: Sandi Mariscal, MD  REFERRING PROVIDER: Jeanella Anton, NP  REFERRING DIAG: Risk for  falls  THERAPY DIAG: Risk for falls ONSET DATE: 5 to 10 years ago  SUBJECTIVE:   SUBLJECTIVE STATEMENT: Pt reports difficulty with standing and walking due low back and L LE pain and instability. "I lie down more than up on my feet". Pt reports having a in home aide 7 days a week for 3 hrs a day.   PERTINENT HISTORY: Bilat THR, and R TKR, obesity, See Snapshot  PAIN:  Are you having pain? Yes VAS scale: 8/10  Pain location: L LBP and L LE  Pain orientation: Left  PAIN TYPE: aching and sharp Pain description: constant  Aggravating factors: Walking and standing Relieving factors: Sitting or lying down  PRECAUTIONS: None  WEIGHT BEARING RESTRICTIONS No  FALLS:  Has patient fallen in last 6 months? No, Number of falls: 0. Does not live home unassisted  LIVING ENVIRONMENT: Lives with: lives with an adult companion Lives in: House/apartment Stairs: Yes; External: 1b steps; Rail on B going up Has following equipment at home: Single point cane and Environmental consultant - 2 wheeled  OCCUPATION: Retired  PLOF: Independent with household mobility with device  PATIENT GOALS: Try to get better   OBJECTIVE:   DIAGNOSTIC FINDINGS: none  PATIENT SURVEYS:  FOTO To be assessed  COGNITION:  Overall cognitive status: Within functional limits for tasks assessed     SENSATION:  Light touch: Deficits decrease for lower legs and feet- stocking pattern   POSTURE:  Forward head, decreased thoracic kyphosis, increased lumbar lordosis, anterior pelvic tilt  LE AROM/PROM:  Trunk AROM: Markedly decreased with all motions due to pain  LE MMT:  MMT Right 01/06/2021 Left 01/06/2021  Hip flexion 3 2  Hip extension 3 2+  Hip abduction 3 2+  Hip adduction 3 3  Hip internal rotation 3 2+  Hip external rotation 3 2+  Knee flexion 3 3  Knee extension 3+ 2+  Ankle dorsiflexion    Ankle plantarflexion    Ankle inversion    Ankle eversion     (Blank rows = not tested)   FUNCTIONAL TESTS:   Pt was not able to SLS L or R for greater than 1 sec  GAIT: Distance walked: 100 ft Assistive device utilized: None, needs SPC or RW Level of assistance: Modified independence Comments: Decreased pace and step length. Antalgic pattern over the L LE.    TODAY'S TREATMENT: - supine bridging x10 - supine marching x10 - HL clamshell x10, RTB - HL hip add sets c ball x10 - Seated forward rollouts c physioball x2 forward and laterally L and R   PATIENT EDUCATION:  Education details: Eval findings, POC, HEP Person educated: Patient Education method: Explanation, Demonstration, Tactile cues, Verbal cues, and Handouts Education comprehension: verbalized understanding, returned demonstration, verbal cues required, and tactile cues required   HOME EXERCISE PROGRAM:   ASSESSMENT:  CLINICAL IMPRESSION: Patient is a 70 y.o. F who was seen today for physical therapy evaluation and treatment for risk of falls related to LBP, L LE pain, LE/lumbopelvic  weakness, and decrease sensation of both feet. Objective impairments include Abnormal gait, decreased activity tolerance, decreased balance, difficulty walking, decreased ROM, decreased strength, decreased safety awareness, impaired sensation, improper body mechanics, postural dysfunction, obesity, and pain. These impairments are limiting patient from cleaning, community activity, meal prep, laundry, yard work, and shopping. Personal factors including Age, Behavior pattern, Fitness, Past/current experiences, Time since onset of injury/illness/exacerbation, and 3+ comorbidities: CHF, DM, obesity, Hx of medical noncompliance are also affecting patient's functional outcome. Patient will benefit from skilled PT to address above impairments and improve overall function.  REHAB POTENTIAL: Fair  CLINICAL DECISION MAKING: Evolving/moderate complexity  EVALUATION COMPLEXITY: Moderate   GOALS:   SHORT TERM GOALS:  STG Name Target Date Goal status   1 Pt will be Ind in an initial HEP Baseline: started on eval 01/26/21 INITIAL  2 Pt will voice understanding of measures to assist in the decrease and management of pain Baseline:  01/26/21 INITIAL   LONG TERM GOALS:   LTG Name Target Date Goal status  1 Pt will be Ind in a final HEP to maintain achieved LOF Baseline:       03/09/21  INITIAL  2 Pt trunk ROM will improve to only moderate limitation as indication of decreased pain Baseline: Marked limitation 03/09/21 INITIAL  3 Pt's will report improved to LBP and L LE pain to 5/10 or less with daily activities Baseline:8/10 03/09/21 INITIAL  4 Pt will demonstrate improved L LE strength to 3/5 or greater and R LE strength to 4/5 or greater for improved functional mobility Baseline: see flowsheets 03/09/21 INITIAL  5 Pt's walking tolerance will improve to 333ft s rest c or s and AD Baseline: 03/09/21 INITIAL  6 Assess pt's 2MWT and Berg and set goals Baseline: 03/09/21 INITIAL   PLAN: PT FREQUENCY: 2x/week  PT DURATION: 8 weeks  PLANNED INTERVENTIONS: Therapeutic exercises, Therapeutic activity, Neuro Muscular re-education, Balance training, Gait training, Patient/Family education, Joint mobilization, Stair training, Dry Needling, Electrical stimulation, Spinal mobilization, Cryotherapy, Moist heat, Taping, Traction, Ultrasound, Ionotophoresis 4mg /ml Dexamethasone, and Manual therapy  PLAN FOR NEXT SESSION: Complete FOTO, assess response to HEP. Progress therex as indicated, assess 2 MWT and Berg and set goals  [employer] MS, PT 01/06/21 10:22 PM

## 2021-01-07 ENCOUNTER — Other Ambulatory Visit: Payer: Self-pay

## 2021-01-07 ENCOUNTER — Ambulatory Visit: Payer: Medicare Other

## 2021-01-07 DIAGNOSIS — R262 Difficulty in walking, not elsewhere classified: Secondary | ICD-10-CM

## 2021-01-07 DIAGNOSIS — G8929 Other chronic pain: Secondary | ICD-10-CM

## 2021-01-07 DIAGNOSIS — M6281 Muscle weakness (generalized): Secondary | ICD-10-CM

## 2021-01-07 DIAGNOSIS — Z9181 History of falling: Secondary | ICD-10-CM

## 2021-01-07 DIAGNOSIS — M5386 Other specified dorsopathies, lumbar region: Secondary | ICD-10-CM

## 2021-01-07 NOTE — Progress Notes (Addendum)
[facility] Note  01/11/2021     CHIEF COMPLAINT Patient presents for Retina Follow Up  HISTORY OF PRESENT ILLNESS: [name] is a 70 y.o. female who presents to the clinic today for:   HPI     Retina Follow Up   Patient presents with  Diabetic Retinopathy.  In right eye.  Severity is moderate.  Duration of 12 months.  Since onset it is stable.  I, the attending physician,  performed the HPI with the patient and updated documentation appropriately.        Comments   Pt here for 1 yr ret f/u for prolif retinopathy /BRAO OD. Pt states vision is the same, no changes. She does not know her A1C level currently. BS yesterday was in the 100s.       Last edited by Bernarda Caffey, MD on 01/11/2021 12:52 PM.      Referring physician: Donald Prose, MD [place],  [place] [address]  HISTORICAL INFORMATION:   Selected notes from the MEDICAL RECORD NUMBER Referred by Dr. Thurston Hole for concern of BRVO OD / ERM OD LEE: 02.10.20 (S. Bernstorf) [BCVA: OD: 20/25- OS: 20/25] Ocular Hx-DES OU, narrow angles PMH-DM (metformin, novolin, lantus), HTN, HLD, heart disease   CURRENT MEDICATIONS: No current outpatient medications on file. (Ophthalmic Drugs)   No current facility-administered medications for this visit. (Ophthalmic Drugs)   Current Outpatient Medications (Other)  Medication Sig   albuterol (VENTOLIN HFA) 108 (90 Base) MCG/ACT inhaler Inhale 2 puffs into the lungs every 6 (six) hours as needed for wheezing or shortness of breath.   apixaban (ELIQUIS) 5 MG TABS tablet TAKE 1 TABLET(5 MG) BY MOUTH TWICE DAILY   atorvastatin (LIPITOR) 40 MG tablet TAKE 1 TABLET(40 MG) BY MOUTH DAILY   baclofen (LIORESAL) 10 MG tablet Take 10 mg by mouth 2 (two) times daily.   carvedilol (COREG) 25 MG tablet TAKE ONE TABLET BY MOUTH TWICE A DAY WITH MEALS   ENTRESTO 97-103 MG TAKE ONE TABLET BY MOUTH TWICE A DAY   furosemide (LASIX)  20 MG tablet Take 3 tablets (60 mg total) by mouth daily.   gabapentin (NEURONTIN) 100 MG capsule Take 100 mg by mouth 3 (three) times daily.   glucose blood (FREESTYLE TEST STRIPS) test strip Use as instructed   glucose monitoring kit (FREESTYLE) monitoring kit 1 each by Does not apply route 4 (four) times daily - after meals and at bedtime. 1 month Diabetic Testing Supplies for QAC-QHS accuchecks.   HUMALOG KWIKPEN 100 UNIT/ML KwikPen Sliding scale   insulin glargine (LANTUS) 100 UNIT/ML Solostar Pen Inject 50 Units into the skin at bedtime.   Insulin Pen Needle 32G X 8 MM MISC Use as directed   isosorbide-hydrALAZINE (BIDIL) 20-37.5 MG tablet Take 2 tablets by mouth 3 (three) times daily.   Lancets (FREESTYLE) lancets Use as instructed   metFORMIN (GLUMETZA) 1000 MG (MOD) 24 hr tablet Take 1,000 mg by mouth in the morning and at bedtime.   ondansetron (ZOFRAN) 4 MG tablet Take 1 tablet (4 mg total) by mouth every 6 (six) hours.   Oxycodone HCl 10 MG TABS Take 10 mg by mouth 3 (three) times daily as needed (pain).   spironolactone (ALDACTONE) 25 MG tablet TAKE 1 TABLET(25 MG) BY MOUTH DAILY   Tafamidis (VYNDAMAX) 61 MG CAPS TAKE 1 CAPSULE (61 MG) BY MOUTH DAILY.   potassium chloride SA (KLOR-CON) 10 MEQ tablet Take 6 tablets (  60 mEq total) by mouth daily. (Patient not taking: Reported on 11/20/2020)   No current facility-administered medications for this visit. (Other)   REVIEW OF SYSTEMS: ROS   Positive for: Endocrine, Cardiovascular, Eyes, Respiratory Negative for: Constitutional, Gastrointestinal, Neurological, Skin, Genitourinary, Musculoskeletal, HENT, Psychiatric, Allergic/Imm, Heme/Lymph Last edited by Kingsley Spittle, COT on 01/11/2021  9:16 AM.     ALLERGIES Allergies  Allergen Reactions   Morphine And Related Nausea Only    Severe nausea   Penicillins Other (See Comments)    Pt states she has had a pain in her leg since a penicillin injection 2 months ago (reported  08/31/17).  Has tolerated amoxicillin oral.  Has patient had a PCN reaction causing immediate rash, facial/tongue/throat swelling, SOB or lightheadedness with hypotension: No Has patient had a PCN reaction causing severe rash involving mucus membranes or skin necrosis: No Has patient had a PCN reaction that required hospitalization: No Has patient had a PCN reaction occurring within the last 10 years: Yes If a   PAST MEDICAL HISTORY Past Medical History:  Diagnosis Date   Anemia    a. Noted on 07/2012 labs, instructed to f/u PCP.   Arthritis    "joints" (11/18/2012)   CAD (coronary artery disease), native coronary artery    a. Nonobstructive by cath 02/2012 (done because of low EF).   Chronic bronchitis ([place])    "~ every other year" (11/18/2012)   Chronic combined systolic and diastolic CHF (congestive heart failure) ([place])    a. 03/05/12 echo:  LVEF 20-25%, moderate LVH , inferior and basal to mid septal akinesis, anterior moderate to severe hypokinesis and grade 2 diastolic dysfunction. b. EF 07/2012: EF still 25% (unclear medication compliance).   Chronic lower back pain    Headache(784.0)    "often; maybe not daily" (11/18/2012)   High cholesterol    History of noncompliance with medical treatment    Hypertension    LBBB (left bundle branch block)    Orthopnea    Tobacco abuse    Type II diabetes mellitus ([place])    Past Surgical History:  Procedure Laterality Date   BI-VENTRICULAR IMPLANTABLE CARDIOVERTER DEFIBRILLATOR N/A 11/18/2012   Procedure: BI-VENTRICULAR IMPLANTABLE CARDIOVERTER DEFIBRILLATOR  (CRT-D);  Surgeon: Evans Lance, MD;  Location: [facility] CATH LAB;  Service: Cardiovascular;  Laterality: N/A;   BI-VENTRICULAR IMPLANTABLE CARDIOVERTER DEFIBRILLATOR  (CRT-D)  11/18/2012   BIV ICD GENERATOR CHANGEOUT N/A 11/22/2020   Procedure: BIV ICD GENERATOR CHANGEOUT;  Surgeon: Evans Lance, MD;  Location: [facility] CV LAB;  Service: Cardiovascular;  Laterality: N/A;   CARDIAC  CATHETERIZATION  03/04/12   nonobstructive CAD, elevated LVEDP and tortuous vessels suggestive of long-standing hypertension   COLONOSCOPY WITH PROPOFOL N/A 12/12/2016   Procedure: COLONOSCOPY WITH PROPOFOL;  Surgeon: Carol Ada, MD;  Location: [facility] ENDOSCOPY;  Service: Endoscopy;  Laterality: N/A;   JOINT REPLACEMENT     Bilateral hip and right knee   LEFT HEART CATH N/A 03/05/2012   Procedure: LEFT HEART CATH;  Surgeon: Larey Dresser, MD;  Location: [facility] CATH LAB;  Service: Cardiovascular;  Laterality: N/A;   FAMILY HISTORY Family History  Problem Relation Age of Onset   Heart failure Mother    Heart disease Neg Hx    SOCIAL HISTORY Social History   Tobacco Use   Smoking status: Former    Packs/day: 1.00    Years: 40.00    Pack years: 40.00    Types: Cigarettes    Start date: 06/23/1972  Quit date: 03/26/2012    Years since quitting: 8.8   Smokeless tobacco: Never  Vaping Use   Vaping Use: Never used  Substance Use Topics   Alcohol use: No   Drug use: No       OPHTHALMIC EXAM:  Base Eye Exam     Visual Acuity (Snellen - Linear)       Right Left   Dist [place] 20/40 20/20   Dist ph  NI          Tonometry (Tonopen, 9:25 AM)       Right Left   Pressure 18 14         Pupils       Dark Light Shape React APD   Right 2 1 Round Minimal None   Left 2 1 Round Minimal None         Visual Fields (Counting fingers)       Left Right    Full Full         Extraocular Movement       Right Left    Full, Ortho Full, Ortho         Neuro/Psych     Oriented x3: Yes   Mood/Affect: Normal         Dilation     Both eyes: 1.0% Mydriacyl, 2.5% Phenylephrine @ 9:26 AM           Slit Lamp and Fundus Exam     Slit Lamp Exam       Right Left   Lids/Lashes Dermatochalasis - upper lid Dermatochalasis - upper lid   Conjunctiva/Sclera nasal Pinguecula, Melanosis nasal/temporal Pinguecula, Melanosis--no injection   Cornea Trace Punctate epithelial  erosions, Well healed  IT cataract wounds, mild arcus 1+ fine punctate epithelial erosions, well healed temporal cataract wounds   Anterior Chamber Deep and quiet deep, no cell or flare   Iris Round and dilated, No NVI Round and dilated, No NVI   Lens PC IOL in perfect position PC IOL in good position, 1+ PCO inferiorly   Anterior Vitreous Vitreous syneresis Vitreous syneresis         Fundus Exam       Right Left   Disc +cupping, pallor, mild tilt, temporal PPA, Sharp sharp rim, +cupping, temporal PPA, mild tilt, Pallor   C/D Ratio 0.6 0.8   Macula Flat, Blunted foveal reflex, 3-4+ERM, no heme Good foveal reflex, stable improvement in cystic changes, scattered MAs/IRH   Vessels Vascular attenuation, veins dilated/tortuous, AV crossing changes, white, sclerotic arteriole / BRAO at 1200 - good laser surrounding Vascular attenuation, Tortuous, Copper wiring, AV crossing changes, mild dilation of venuoles   Periphery Attached, fibrotic NV at 1200 -- regressing ?seafan, good segmental PRP laser changes in area of BRAO and NV, focal blot hemes superior periphery;  pigmented cystoid degeneration at 0600 Attached, scattered MA/DBH           Refraction     Manifest Refraction       Sphere Cylinder Axis Dist VA   Right -0.75 +0.50 180 20/20   Left               IMAGING AND PROCEDURES  Imaging and Procedures for '@TODAY' @  OCT, Retina - OU - Both Eyes       Right Eye Quality was good. Central Foveal Thickness: 480. Progression has been stable. Findings include abnormal foveal contour, epiretinal membrane, no IRF, no SRF, preretinal fibrosis, macular pucker (Thick ERM -- stable).   Left  Eye Quality was good. Central Foveal Thickness: 235. Progression has been stable. Findings include normal foveal contour, no SRF, vitreomacular adhesion , no IRF.   Notes *Images captured and stored on drive  Diagnosis / Impression:  OD: Thick ERM w/ pucker -- stable OS: NFP, no IRF/SRF, mild  VMA -- stable resolution of cystic changes  Clinical management:  See below  Abbreviations: NFP - Normal foveal profile. CME - cystoid macular edema. PED - pigment epithelial detachment. IRF - intraretinal fluid. SRF - subretinal fluid. EZ - ellipsoid zone. ERM - epiretinal membrane. ORA - outer retinal atrophy. ORT - outer retinal tubulation. SRHM - subretinal hyper-reflective material            ASSESSMENT/PLAN:    ICD-10-CM   1. BRAO (branch retinal artery occlusion), right  H34.231 OCT, Retina - OU - Both Eyes    2. Proliferative retinopathy of right eye  H35.21     3. Epiretinal membrane (ERM) of right eye  H35.371 OCT, Retina - OU - Both Eyes    4. Moderate nonproliferative diabetic retinopathy of both eyes without macular edema associated with type 2 diabetes mellitus (HCC)  A35.5732 OCT, Retina - OU - Both Eyes    5. Essential hypertension  I10     6. Hypertensive retinopathy of both eyes  H35.033     7. Pseudophakia of both eyes  Z96.1      1,2. BRAO w/ proliferative retinopathy -- stable  - sclerotic superior arteriole with distal fibrotic NV (?sea fan) and overlying preretinal and vitreous heme -- improved  - etiology could also be DM2 vs sickle cell retinopathy -- sickle cell screen negative  - s/p segmental PRP OD (2.17.2020) + fill-in PRP OD 7.13.20 - good laser changes in place  - repeat FA (3.12.21) shows interval improvement in NV and leakage  - improved view post cataract surgery - no retinal or ophthalmic interventions indicated or recommended   - f/u 12 months, DFE/OCT  3. Epiretinal membrane, OD -- stable  - thick ERM overlying retina with pucker and distorted foveal profile  - BCVA 20/40 w/ Mrx to 20/20 today             - OCT shows thick ERM -- stable from prior  - discussed finding and prognosis and treatment options in detail  - pt happy with vision OD and wishes to continue to monitor ERM for now  - f/u 12 months, sooner prn  4. Moderate  nonproliferative diabetic retinopathy w/o DME, OU  - exam shows scattered MA OU, no NV  - FA (02.17.20) shows no NV OS  - OCT without diabetic macular edema, OU  - monitor   5,6. Hypertensive retinopathy OU  - discussed importance of tight BP control  - monitor   7. Pseudophakia OU  - s/p CE/IOL (OD: Dr. Shirleen Schirmer, Jun 10, 2018, OS: Dr. Shirleen Schirmer, September 16, 2018 )  - beautiful surgeries, doing well  - monitor   Ophthalmic Meds Ordered this visit:  No orders of the defined types were placed in this encounter.    Return in about 1 year (around 01/11/2022) for 12 mo f/u for BRAO and ERM OD w/DFE&OCT.  There are no Patient Instructions on file for this visit.  Explained the diagnoses, plan, and follow up with the patient and they expressed understanding.  Patient expressed understanding of the importance of proper follow up care.   This document serves as a record of services personally performed by Aaron Edelman  Antony Haste, MD, PhD. It was created on their behalf by Estill Bakes, [place] an ophthalmic technician. The creation of this record is the provider's dictation and/or activities during the visit.    Electronically signed by: Estill Bakes, [place] 12.16.2022 @ 12:57 PM   Gardiner Sleeper, M.D., Ph.D. Diseases & Surgery of the Retina and [place] 01/11/2021  I have reviewed the above documentation for accuracy and completeness, and I agree with the above. Gardiner Sleeper, M.D., Ph.D. 01/11/21 12:57 PM   Abbreviations: M myopia (nearsighted); A astigmatism; H hyperopia (farsighted); P presbyopia; Mrx spectacle prescription;  CTL contact lenses; OD right eye; OS left eye; OU both eyes  XT exotropia; ET esotropia; PEK punctate epithelial keratitis; PEE punctate epithelial erosions; DES dry eye syndrome; MGD meibomian gland dysfunction; ATs artificial tears; PFAT's preservative free artificial tears; [place] nuclear sclerotic cataract; PSC posterior subcapsular  cataract; ERM epi-retinal membrane; PVD posterior vitreous detachment; RD retinal detachment; DM diabetes mellitus; DR diabetic retinopathy; NPDR non-proliferative diabetic retinopathy; PDR proliferative diabetic retinopathy; CSME clinically significant macular edema; DME diabetic macular edema; dbh dot blot hemorrhages; CWS cotton wool spot; POAG primary open angle glaucoma; C/D cup-to-disc ratio; HVF humphrey visual field; GVF goldmann visual field; OCT optical coherence tomography; IOP intraocular pressure; BRVO Branch retinal vein occlusion; CRVO central retinal vein occlusion; CRAO central retinal artery occlusion; BRAO branch retinal artery occlusion; RT retinal tear; SB scleral buckle; PPV pars plana vitrectomy; VH Vitreous hemorrhage; PRP panretinal laser photocoagulation; IVK intravitreal kenalog; VMT vitreomacular traction; MH Macular hole;  NVD neovascularization of the disc; NVE neovascularization elsewhere; AREDS age related eye disease study; ARMD age related macular degeneration; POAG primary open angle glaucoma; EBMD epithelial/anterior basement membrane dystrophy; ACIOL anterior chamber intraocular lens; IOL intraocular lens; PCIOL posterior chamber intraocular lens; Phaco/IOL phacoemulsification with intraocular lens placement; [place] photorefractive keratectomy; LASIK laser assisted in situ keratomileusis; HTN hypertension; DM diabetes mellitus; COPD chronic obstructive pulmonary disease

## 2021-01-07 NOTE — Therapy (Signed)
OUTPATIENT PHYSICAL THERAPY TREATMENT NOTE   Patient Name: [name] MRN: [id number] DOB:1950-11-30, 70 y.o., female Today's Date: 01/07/2021  PCP: Sandi Mariscal, MD REFERRING PROVIDER: Sandi Mariscal, MD   PT End of Session - 01/07/21 0831     Visit Number 2    Number of Visits 17    Date for PT Re-Evaluation 03/09/21    Authorization Type [employer] MEDICARE    Authorization Time Period Reassess FOTO on 6th and 11th visits    Progress Note Due on Visit 10    PT Start Time 0935    PT Stop Time 1000    PT Time Calculation (min) 25 min    Activity Tolerance Patient limited by pain    Behavior During Therapy [facility] for tasks assessed/performed             Past Medical History:  Diagnosis Date   Anemia    a. Noted on 07/2012 labs, instructed to f/u PCP.   Arthritis    "joints" (11/18/2012)   CAD (coronary artery disease), native coronary artery    a. Nonobstructive by cath 02/2012 (done because of low EF).   Chronic bronchitis ([place])    "~ every other year" (11/18/2012)   Chronic combined systolic and diastolic CHF (congestive heart failure) ([place])    a. 03/05/12 echo:  LVEF 20-25%, moderate LVH , inferior and basal to mid septal akinesis, anterior moderate to severe hypokinesis and grade 2 diastolic dysfunction. b. EF 07/2012: EF still 25% (unclear medication compliance).   Chronic lower back pain    Headache(784.0)    "often; maybe not daily" (11/18/2012)   High cholesterol    History of noncompliance with medical treatment    Hypertension    LBBB (left bundle branch block)    Orthopnea    Tobacco abuse    Type II diabetes mellitus ([place])    Past Surgical History:  Procedure Laterality Date   BI-VENTRICULAR IMPLANTABLE CARDIOVERTER DEFIBRILLATOR N/A 11/18/2012   Procedure: BI-VENTRICULAR IMPLANTABLE CARDIOVERTER DEFIBRILLATOR  (CRT-D);  Surgeon: Evans Lance, MD;  Location: [facility] CATH LAB;  Service: Cardiovascular;  Laterality: N/A;   BI-VENTRICULAR IMPLANTABLE CARDIOVERTER  DEFIBRILLATOR  (CRT-D)  11/18/2012   BIV ICD GENERATOR CHANGEOUT N/A 11/22/2020   Procedure: BIV ICD GENERATOR CHANGEOUT;  Surgeon: Evans Lance, MD;  Location: [facility] CV LAB;  Service: Cardiovascular;  Laterality: N/A;   CARDIAC CATHETERIZATION  03/04/12   nonobstructive CAD, elevated LVEDP and tortuous vessels suggestive of long-standing hypertension   COLONOSCOPY WITH PROPOFOL N/A 12/12/2016   Procedure: COLONOSCOPY WITH PROPOFOL;  Surgeon: Carol Ada, MD;  Location: [facility] ENDOSCOPY;  Service: Endoscopy;  Laterality: N/A;   JOINT REPLACEMENT     Bilateral hip and right knee   LEFT HEART CATH N/A 03/05/2012   Procedure: LEFT HEART CATH;  Surgeon: Larey Dresser, MD;  Location: [facility] CATH LAB;  Service: Cardiovascular;  Laterality: N/A;   Patient Active Problem List   Diagnosis Date Noted   Right foot infection 10/22/2017   Cellulitis in diabetic foot ([place]) 10/22/2017   Hyperglycemia 10/22/2017   Lumbar radiculopathy 09/18/2017   Degenerative spondylolisthesis 09/18/2017   Atrial fibrillation ([place]) 02/11/2017   Paroxysmal A-fib (HCC)    Biventricular automatic implantable cardioverter defibrillator in situ    Sepsis ([place]) 04/20/2016   UTI (urinary tract infection) 04/20/2016   Dyspnea 07/19/2015   Interstitial lung disease ([place]) 11/20/2014   SIRS (systemic inflammatory response syndrome) ([place]) 02/03/2014   IDDM (insulin dependent diabetes mellitus) 02/03/2014  Tachycardia 54/62/7035   Chronic systolic CHF (congestive heart failure) ([place]) 09/29/2012   Hypoxia 03/06/2012   Hypertensive heart disease 03/06/2012   Nonischemic cardiomyopathy ([place]) 03/06/2012   Tobacco abuse 03/06/2012   Type 2 diabetes mellitus ([place]) 03/06/2012   Hypertension 03/06/2012   CAD (coronary artery disease), native coronary artery 03/06/2012   Hypokalemia 03/06/2012   Acute bronchitis 02/01/2009   Sleep apnea 02/01/2009   Chest pain 02/01/2009    REFERRING DIAG: Risks for falls  THERAPY DIAG:   Risk for falls  Chronic bilateral low back pain with left-sided sciatica  Muscle weakness (generalized)  Difficulty in walking, not elsewhere classified  Decreased ROM of lumbar spine  PERTINENT HISTORY: Bilat THR, and R TKR, obesity, See Snapshot  PRECAUTIONS: None  SUBJECTIVE: Pt reports her low back is feeling better after the initial PT session  PAIN:  Are you having pain? Yes VAS scale: 5/10 Pain location: Low back and L LE Pain orientation: Left  PAIN TYPE: aching Pain description: intermittent  Aggravating factors: Standing and walking Relieving factors: Sitting and lying down  OBJECTIVE:    DIAGNOSTIC FINDINGS: none   PATIENT SURVEYS:  FOTO To be assessed   COGNITION:          Overall cognitive status: Within functional limits for tasks assessed                        SENSATION:          Light touch: Deficits decrease for lower legs and feet- stocking pattern     POSTURE:  Forward head, decreased thoracic kyphosis, increased lumbar lordosis, anterior pelvic tilt   LE AROM/PROM:   Trunk AROM: Markedly decreased with all motions due to pain   LE MMT:   MMT Right 01/06/2021 Left 01/06/2021  Hip flexion 3 2  Hip extension 3 2+  Hip abduction 3 2+  Hip adduction 3 3  Hip internal rotation 3 2+  Hip external rotation 3 2+  Knee flexion 3 3  Knee extension 3+ 2+  Ankle dorsiflexion      Ankle plantarflexion      Ankle inversion      Ankle eversion       (Blank rows = not tested)     FUNCTIONAL TESTS:  Pt was not able to SLS L or R for greater than 1 sec   GAIT: Distance walked: 100 ft Assistive device utilized: None, needs SPC or RW Level of assistance: Modified independence Comments: Decreased pace and step length. Antalgic pattern over the L LE.       TODAY'S TREATMENT: 01/07/21:  - supine bridging x10, 3" - Supine PPT x10, 3" - supine marching x10 - HL clamshell x10,3", RTB - HL hip add sets c ball x10, 5" - Seated forward  rollouts c physioball x2 forward and laterally L and R - LTR, 5x, 5"     PATIENT EDUCATION:  Education details: Reviewed and completed HEP and updated for PPT and LTR Person educated: Patient Education method: Explanation, Demonstration, Tactile cues, Verbal cues, and Handouts Education comprehension: verbalized understanding, returned demonstration, verbal cues required, and tactile cues required     HOME EXERCISE PROGRAM:          ASSESSMENT:   CLINICAL IMPRESSION: PT session was limited with pt having another appt after PT. Pt returns to PT following the initial eval reporting her low back and L LE pain are improved. Pt notes she has been completing her HEP.  Ther ex was completed with HEP being reviewed and 2 new exs added for abdominal strengthening and low back flexibility. Pt tolerated the session without adverse effects. Pt reported 0/10 low back and L LE pain at the end of the session.    REHAB POTENTIAL: Fair   CLINICAL DECISION MAKING: Evolving/moderate complexity   EVALUATION COMPLEXITY: Moderate     GOALS:     SHORT TERM GOALS:   STG Name Target Date Goal status  1 Pt will be Ind in an initial HEP Baseline: started on eval 01/26/21 INITIAL  2 Pt will voice understanding of measures to assist in the decrease and management of pain Baseline:  01/26/21 INITIAL    LONG TERM GOALS:    LTG Name Target Date Goal status  1 Pt will be Ind in a final HEP to maintain achieved LOF Baseline:       03/09/21   INITIAL  2 Pt trunk ROM will improve to only moderate limitation as indication of decreased pain Baseline: Marked limitation 03/09/21 INITIAL  3 Pt's will report improved to LBP and L LE pain to 5/10 or less with daily activities Baseline:8/10 03/09/21 INITIAL  4 Pt will demonstrate improved L LE strength to 3/5 or greater and R LE strength to 4/5 or greater for improved functional mobility Baseline: see flowsheets 03/09/21 INITIAL  5 Pt's walking tolerance will  improve to 366ft s rest c or s and AD Baseline: 03/09/21 INITIAL  6 Assess pt's 2MWT and Berg and set goals Baseline: 03/09/21 INITIAL    PLAN: PT FREQUENCY: 2x/week   PT DURATION: 8 weeks   PLANNED INTERVENTIONS: Therapeutic exercises, Therapeutic activity, Neuro Muscular re-education, Balance training, Gait training, Patient/Family education, Joint mobilization, Stair training, Dry Needling, Electrical stimulation, Spinal mobilization, Cryotherapy, Moist heat, Taping, Traction, Ultrasound, Ionotophoresis 4mg /ml Dexamethasone, and Manual therapy   PLAN FOR NEXT SESSION: Complete FOTO, assess response to HEP. Progress therex as indicated, assess 2 MWT and Berg and set goals.     Kyl Givler MS, PT 01/07/21 10:32 AM

## 2021-01-10 ENCOUNTER — Other Ambulatory Visit (HOSPITAL_COMMUNITY): Payer: Self-pay

## 2021-01-10 ENCOUNTER — Ambulatory Visit: Payer: Medicare Other

## 2021-01-10 ENCOUNTER — Other Ambulatory Visit: Payer: Self-pay

## 2021-01-10 DIAGNOSIS — M5386 Other specified dorsopathies, lumbar region: Secondary | ICD-10-CM

## 2021-01-10 DIAGNOSIS — Z9181 History of falling: Secondary | ICD-10-CM | POA: Diagnosis not present

## 2021-01-10 DIAGNOSIS — G8929 Other chronic pain: Secondary | ICD-10-CM

## 2021-01-10 DIAGNOSIS — R262 Difficulty in walking, not elsewhere classified: Secondary | ICD-10-CM

## 2021-01-10 DIAGNOSIS — M6281 Muscle weakness (generalized): Secondary | ICD-10-CM

## 2021-01-10 NOTE — Therapy (Signed)
OUTPATIENT PHYSICAL THERAPY TREATMENT NOTE   Patient Name: [name] MRN: [id number] DOB:Oct 30, 1950, 70 y.o., female Today's Date: 01/10/2021  PCP: Sandi Mariscal, MD REFERRING PROVIDER: Sandi Mariscal, MD   PT End of Session - 01/10/21 1329     Visit Number 3    Number of Visits 17    Date for PT Re-Evaluation 03/09/21    Authorization Type [employer] MEDICARE    Authorization Time Period Reassess FOTO on 6th and 11th visits    Progress Note Due on Visit 10    PT Start Time 0848    PT Stop Time 0930    PT Time Calculation (min) 42 min    Activity Tolerance Patient limited by pain    Behavior During Therapy [facility] for tasks assessed/performed              Past Medical History:  Diagnosis Date   Anemia    a. Noted on 07/2012 labs, instructed to f/u PCP.   Arthritis    "joints" (11/18/2012)   CAD (coronary artery disease), native coronary artery    a. Nonobstructive by cath 02/2012 (done because of low EF).   Chronic bronchitis ([place])    "~ every other year" (11/18/2012)   Chronic combined systolic and diastolic CHF (congestive heart failure) ([place])    a. 03/05/12 echo:  LVEF 20-25%, moderate LVH , inferior and basal to mid septal akinesis, anterior moderate to severe hypokinesis and grade 2 diastolic dysfunction. b. EF 07/2012: EF still 25% (unclear medication compliance).   Chronic lower back pain    Headache(784.0)    "often; maybe not daily" (11/18/2012)   High cholesterol    History of noncompliance with medical treatment    Hypertension    LBBB (left bundle branch block)    Orthopnea    Tobacco abuse    Type II diabetes mellitus ([place])    Past Surgical History:  Procedure Laterality Date   BI-VENTRICULAR IMPLANTABLE CARDIOVERTER DEFIBRILLATOR N/A 11/18/2012   Procedure: BI-VENTRICULAR IMPLANTABLE CARDIOVERTER DEFIBRILLATOR  (CRT-D);  Surgeon: Evans Lance, MD;  Location: [facility] CATH LAB;  Service: Cardiovascular;  Laterality: N/A;   BI-VENTRICULAR IMPLANTABLE CARDIOVERTER  DEFIBRILLATOR  (CRT-D)  11/18/2012   BIV ICD GENERATOR CHANGEOUT N/A 11/22/2020   Procedure: BIV ICD GENERATOR CHANGEOUT;  Surgeon: Evans Lance, MD;  Location: [place] CV LAB;  Service: Cardiovascular;  Laterality: N/A;   CARDIAC CATHETERIZATION  03/04/12   nonobstructive CAD, elevated LVEDP and tortuous vessels suggestive of long-standing hypertension   COLONOSCOPY WITH PROPOFOL N/A 12/12/2016   Procedure: COLONOSCOPY WITH PROPOFOL;  Surgeon: Carol Ada, MD;  Location: [facility] ENDOSCOPY;  Service: Endoscopy;  Laterality: N/A;   JOINT REPLACEMENT     Bilateral hip and right knee   LEFT HEART CATH N/A 03/05/2012   Procedure: LEFT HEART CATH;  Surgeon: Larey Dresser, MD;  Location: [facility] CATH LAB;  Service: Cardiovascular;  Laterality: N/A;   Patient Active Problem List   Diagnosis Date Noted   Right foot infection 10/22/2017   Cellulitis in diabetic foot ([place]) 10/22/2017   Hyperglycemia 10/22/2017   Lumbar radiculopathy 09/18/2017   Degenerative spondylolisthesis 09/18/2017   Atrial fibrillation ([place]) 02/11/2017   Paroxysmal A-fib (HCC)    Biventricular automatic implantable cardioverter defibrillator in situ    Sepsis ([place]) 04/20/2016   UTI (urinary tract infection) 04/20/2016   Dyspnea 07/19/2015   Interstitial lung disease ([place]) 11/20/2014   SIRS (systemic inflammatory response syndrome) ([place]) 02/03/2014   IDDM (insulin dependent diabetes mellitus) 02/03/2014  Tachycardia 03/50/0938   Chronic systolic CHF (congestive heart failure) ([place]) 09/29/2012   Hypoxia 03/06/2012   Hypertensive heart disease 03/06/2012   Nonischemic cardiomyopathy ([place]) 03/06/2012   Tobacco abuse 03/06/2012   Type 2 diabetes mellitus ([place]) 03/06/2012   Hypertension 03/06/2012   CAD (coronary artery disease), native coronary artery 03/06/2012   Hypokalemia 03/06/2012   Acute bronchitis 02/01/2009   Sleep apnea 02/01/2009   Chest pain 02/01/2009    REFERRING DIAG: Risks for falls  THERAPY DIAG:  Risks for falls   PERTINENT HISTORY: Bilat THR, and R TKR, obesity, See Snapshot  PRECAUTIONS: None  SUBJECTIVE: Pt reports her low back is feeling better after the initial PT session  PAIN:  Are you having pain? Yes VAS scale: 10/10, pt states she does not need to go to the ED Pain location: Low back and L LE Pain orientation: Left  PAIN TYPE: aching Pain description: intermittent  Aggravating factors: Standing and walking Relieving factors: Sitting and lying down  OBJECTIVE:    DIAGNOSTIC FINDINGS: none   PATIENT SURVEYS:  FOTO To be assessed   COGNITION:          Overall cognitive status: Within functional limits for tasks assessed                        SENSATION:          Light touch: Deficits decrease for lower legs and feet- stocking pattern     POSTURE:  Forward head, decreased thoracic kyphosis, increased lumbar lordosis, anterior pelvic tilt   LE AROM/PROM:   Trunk AROM: Markedly decreased with all motions due to pain   LE MMT:   MMT Right 01/06/2021 Left 01/06/2021  Hip flexion 3 2  Hip extension 3 2+  Hip abduction 3 2+  Hip adduction 3 3  Hip internal rotation 3 2+  Hip external rotation 3 2+  Knee flexion 3 3  Knee extension 3+ 2+  Ankle dorsiflexion      Ankle plantarflexion      Ankle inversion      Ankle eversion       (Blank rows = not tested)     FUNCTIONAL TESTS:  Pt was not able to SLS L or R for greater than 1 sec   GAIT: Distance walked: 100 ft Assistive device utilized: None, needs SPC or RW Level of assistance: Modified independence Comments: Decreased pace and step length. Antalgic pattern over the L LE. 2WMT on 01/10/21: 143ft with decreased pace ans step length       TODAY'S TREATMENT: [facility] Adult PT Treatment:     01/10/2021 Therapeutic Exercise: - Supine PPT 2x10, 3" - supine marching 2x10 - HL clamshell 2x10,3", RTB - HL hip add sets c ball x15, 5" - Seated forward rollouts c physioball x2 forward and  laterally L and R - LTR, 5x, 5" -  supine bridging x10, 3"  xe stopped due to LBP  Self Care:  Discussed with pt with her level of LE and core weakness, her weight, and degenerative changes in her low back, it will take time and consistent completion of her HEP to see improvement.   01/07/21: - Supine PPT 2x10, 3" - supine marching 2x10 - HL clamshell 2x10,3", RTB - HL hip add sets c ball x15, 5" - Seated forward rollouts c physioball x2 forward and laterally L and R - LTR, 5x, 5" -  supine bridging x10, 3" due to LBP  PATIENT EDUCATION:  Education details: Reviewed and completed HEP and updated for PPT and LTR.  Person educated: Patient Education method: Explanation, Demonstration, Tactile cues, Verbal cues, and Handouts Education comprehension: verbalized understanding, returned demonstration, verbal cues required, and tactile cues required     HOME EXERCISE PROGRAM:           [id number]  ASSESSMENT:   CLINICAL IMPRESSION: PT was completd for lumbopelvic strengthening and flexibilty with a flexion bias. Pt was encouraged to be consistent with HEP, see Education. 2MWT completed with pt walking a a decreased pace due to pain and weakness. Pt tolerated today's PT session without adverse effects. Pt reported a min decrease in her low back pain after the PT session.   REHAB POTENTIAL: Fair    CLINICAL DECISION MAKING: Evolving/moderate complexity   EVALUATION COMPLEXITY: Moderate     GOALS:     SHORT TERM GOALS:   STG Name Target Date Goal status  1 Pt will be Ind in an initial HEP Baseline: started on eval 01/26/21 INITIAL  2 Pt will voice understanding of measures to assist in the decrease and management of pain Baseline:  01/26/21 INITIAL    LONG TERM GOALS:    LTG Name Target Date Goal status  1 Pt will be Ind in a final HEP to maintain achieved LOF Baseline:       03/09/21   INITIAL  2 Pt trunk ROM will improve to only moderate limitation as  indication of decreased pain Baseline: Marked limitation 03/09/21 INITIAL  3 Pt's will report improved to LBP and L LE pain to 5/10 or less with daily activities Baseline:8/10 03/09/21 INITIAL  4 Pt will demonstrate improved L LE strength to 3/5 or greater and R LE strength to 4/5 or greater for improved functional mobility Baseline: see flowsheets 03/09/21 INITIAL  5 Pt's walking tolerance will improve to 356ft s rest c or s and AD Baseline: 03/09/21 INITIAL  6 Pt's 2MWT will increase to 200+ ft s AD as indication of improve strength, balance, and pain. Assess Berg and set goal Baseline: 2MWT 123ft 03/09/21 INITIAL    PLAN: PT FREQUENCY: 2x/week   PT DURATION: 8 weeks   PLANNED INTERVENTIONS: Therapeutic exercises, Therapeutic activity, Neuro Muscular re-education, Balance training, Gait training, Patient/Family education, Joint mobilization, Stair training, Dry Needling, Electrical stimulation, Spinal mobilization, Cryotherapy, Moist heat, Taping, Traction, Ultrasound, Ionotophoresis 4mg /ml Dexamethasone, and Manual therapy   PLAN FOR NEXT SESSION: Complete FOTO, assess response to HEP. Progress therex as indicated, assess Berg and set goals.     Lyndsy Gilberto MS, PT 01/10/21 1:40 PM

## 2021-01-11 ENCOUNTER — Ambulatory Visit (INDEPENDENT_AMBULATORY_CARE_PROVIDER_SITE_OTHER): Payer: Medicare Other | Admitting: Ophthalmology

## 2021-01-11 ENCOUNTER — Encounter (INDEPENDENT_AMBULATORY_CARE_PROVIDER_SITE_OTHER): Payer: Self-pay | Admitting: Ophthalmology

## 2021-01-11 DIAGNOSIS — H34231 Retinal artery branch occlusion, right eye: Secondary | ICD-10-CM | POA: Diagnosis not present

## 2021-01-11 DIAGNOSIS — Z961 Presence of intraocular lens: Secondary | ICD-10-CM

## 2021-01-11 DIAGNOSIS — I10 Essential (primary) hypertension: Secondary | ICD-10-CM

## 2021-01-11 DIAGNOSIS — E113393 Type 2 diabetes mellitus with moderate nonproliferative diabetic retinopathy without macular edema, bilateral: Secondary | ICD-10-CM | POA: Diagnosis not present

## 2021-01-11 DIAGNOSIS — H3581 Retinal edema: Secondary | ICD-10-CM

## 2021-01-11 DIAGNOSIS — H35033 Hypertensive retinopathy, bilateral: Secondary | ICD-10-CM

## 2021-01-11 DIAGNOSIS — H35371 Puckering of macula, right eye: Secondary | ICD-10-CM | POA: Diagnosis not present

## 2021-01-11 DIAGNOSIS — H3521 Other non-diabetic proliferative retinopathy, right eye: Secondary | ICD-10-CM

## 2021-01-14 ENCOUNTER — Other Ambulatory Visit (HOSPITAL_COMMUNITY): Payer: Self-pay

## 2021-01-15 ENCOUNTER — Ambulatory Visit: Payer: Medicare Other

## 2021-01-15 ENCOUNTER — Telehealth: Payer: Self-pay

## 2021-01-15 ENCOUNTER — Telehealth (HOSPITAL_COMMUNITY): Payer: Self-pay | Admitting: Surgery

## 2021-01-15 NOTE — Telephone Encounter (Signed)
Information sent for patient to be charged for home sleep study device and pending study.  Patient has not completed the study after several phone calls and reminders.

## 2021-01-15 NOTE — Telephone Encounter (Signed)
Call pt re:no show appt. Pt staeted she thought the appt was no Thurs this week. Pt was reminded of her upcoming appt for Fri 01/18/21 at 930.

## 2021-01-18 ENCOUNTER — Ambulatory Visit: Payer: Medicare Other

## 2021-01-22 ENCOUNTER — Other Ambulatory Visit (HOSPITAL_COMMUNITY): Payer: Self-pay

## 2021-01-24 ENCOUNTER — Emergency Department (HOSPITAL_COMMUNITY): Payer: Medicare Other

## 2021-01-24 ENCOUNTER — Encounter (HOSPITAL_COMMUNITY): Payer: Self-pay

## 2021-01-24 ENCOUNTER — Other Ambulatory Visit: Payer: Self-pay

## 2021-01-24 ENCOUNTER — Emergency Department (HOSPITAL_COMMUNITY)
Admission: EM | Admit: 2021-01-24 | Discharge: 2021-01-24 | Disposition: A | Discharge status: Home / Self Care | Payer: Medicare Other | Attending: Emergency Medicine | Admitting: Emergency Medicine

## 2021-01-24 DIAGNOSIS — M25551 Pain in right hip: Secondary | ICD-10-CM | POA: Diagnosis not present

## 2021-01-24 DIAGNOSIS — I110 Hypertensive heart disease with heart failure: Secondary | ICD-10-CM | POA: Diagnosis not present

## 2021-01-24 DIAGNOSIS — Z794 Long term (current) use of insulin: Secondary | ICD-10-CM | POA: Insufficient documentation

## 2021-01-24 DIAGNOSIS — I2510 Atherosclerotic heart disease of native coronary artery without angina pectoris: Secondary | ICD-10-CM | POA: Insufficient documentation

## 2021-01-24 DIAGNOSIS — I4891 Unspecified atrial fibrillation: Secondary | ICD-10-CM | POA: Diagnosis not present

## 2021-01-24 DIAGNOSIS — I5042 Chronic combined systolic (congestive) and diastolic (congestive) heart failure: Secondary | ICD-10-CM | POA: Diagnosis not present

## 2021-01-24 DIAGNOSIS — Z96643 Presence of artificial hip joint, bilateral: Secondary | ICD-10-CM | POA: Diagnosis not present

## 2021-01-24 DIAGNOSIS — Z79899 Other long term (current) drug therapy: Secondary | ICD-10-CM | POA: Insufficient documentation

## 2021-01-24 DIAGNOSIS — E119 Type 2 diabetes mellitus without complications: Secondary | ICD-10-CM | POA: Insufficient documentation

## 2021-01-24 DIAGNOSIS — Z87891 Personal history of nicotine dependence: Secondary | ICD-10-CM | POA: Insufficient documentation

## 2021-01-24 DIAGNOSIS — Z7901 Long term (current) use of anticoagulants: Secondary | ICD-10-CM | POA: Insufficient documentation

## 2021-01-24 DIAGNOSIS — Z96653 Presence of artificial knee joint, bilateral: Secondary | ICD-10-CM | POA: Insufficient documentation

## 2021-01-24 MED ORDER — CYCLOBENZAPRINE HCL 10 MG PO TABS: 10.0000 mg | ORAL_TABLET | Freq: Two times a day (BID) | ORAL | 0 refills | Status: DC | PRN

## 2021-01-24 MED ORDER — CYCLOBENZAPRINE HCL 10 MG PO TABS
10.0000 mg | ORAL_TABLET | Freq: Once | ORAL | Status: AC
  Administered 2021-01-24: 12:00:00 10 mg via ORAL
  Filled 2021-01-24: qty 1

## 2021-01-24 NOTE — Discharge Instructions (Signed)
You have been seen here for hip pain. I recommend taking over-the-counter pain medications like ibuprofen and/or Tylenol every 6 as needed.  Please follow dosage and on the back of bottle.  I also recommend applying heat to the area and stretching out the muscles as this will help decrease stiffness and pain.  I have given you information on exercises please follow.  Given you prescription for muscle relaxers take as prescribed  Come back to the emergency department if you develop chest pain, shortness of breath, severe abdominal pain, uncontrolled nausea, vomiting, diarrhea.

## 2021-01-24 NOTE — ED Triage Notes (Signed)
Patient c/o  right hip pain x 2 days. Patient reports she has had a right hip replacement in 2008.

## 2021-01-24 NOTE — ED Provider Notes (Signed)
[facility] DEPT Provider Note   CSN: [id number] Arrival date & time: 01/24/21  3154     History Chief Complaint  Patient presents with   Hip Pain    [name] is a 70 y.o. female.  HPI  Patient with medical history including CAD, chronic lower back pain, diabetes, right hip replacement 2008 presents with right hip pain.  He states pain started 3 days ago, came on suddenly, pain remains mainly in her right hip, does not radiate, no associate paresthesia weakness in the lower extremities, denies saddle paresthesias, urinary incontinency, retention, difficulty with bowel movements, she denies  recent falls or trauma to the area.  She has tried over-the-counter pain medication without much relief, pain is worse with move improved with rest, she has no other complaints.  She does pain as a constant throbbing-like sensation.  Does not endorse chest pain, shortness of breath, peripheral edema, no history of IV drug use.   Past Medical History:  Diagnosis Date   Anemia    a. Noted on 07/2012 labs, instructed to f/u PCP.   Arthritis    "joints" (11/18/2012)   CAD (coronary artery disease), native coronary artery    a. Nonobstructive by cath 02/2012 (done because of low EF).   Chronic bronchitis ([place])    "~ every other year" (11/18/2012)   Chronic combined systolic and diastolic CHF (congestive heart failure) ([place])    a. 03/05/12 echo:  LVEF 20-25%, moderate LVH , inferior and basal to mid septal akinesis, anterior moderate to severe hypokinesis and grade 2 diastolic dysfunction. b. EF 07/2012: EF still 25% (unclear medication compliance).   Chronic lower back pain    Headache(784.0)    "often; maybe not daily" (11/18/2012)   High cholesterol    History of noncompliance with medical treatment    Hypertension    LBBB (left bundle branch block)    Orthopnea    Tobacco abuse    Type II diabetes mellitus [facility])     Patient Active Problem List   Diagnosis  Date Noted   Right foot infection 10/22/2017   Cellulitis in diabetic foot ([place]) 10/22/2017   Hyperglycemia 10/22/2017   Lumbar radiculopathy 09/18/2017   Degenerative spondylolisthesis 09/18/2017   Atrial fibrillation ([place]) 02/11/2017   Paroxysmal A-fib ([facility])    Biventricular automatic implantable cardioverter defibrillator in situ    Sepsis ([place]) 04/20/2016   UTI (urinary tract infection) 04/20/2016   Dyspnea 07/19/2015   Interstitial lung disease ([place]) 11/20/2014   SIRS (systemic inflammatory response syndrome) ([place]) 02/03/2014   IDDM (insulin dependent diabetes mellitus) 02/03/2014   Tachycardia 00/86/7619   Chronic systolic CHF (congestive heart failure) ([place]) 09/29/2012   Hypoxia 03/06/2012   Hypertensive heart disease 03/06/2012   Nonischemic cardiomyopathy ([place]) 03/06/2012   Tobacco abuse 03/06/2012   Type 2 diabetes mellitus ([place]) 03/06/2012   Hypertension 03/06/2012   CAD (coronary artery disease), native coronary artery 03/06/2012   Hypokalemia 03/06/2012   Acute bronchitis 02/01/2009   Sleep apnea 02/01/2009   Chest pain 02/01/2009    Past Surgical History:  Procedure Laterality Date   BI-VENTRICULAR IMPLANTABLE CARDIOVERTER DEFIBRILLATOR N/A 11/18/2012   Procedure: BI-VENTRICULAR IMPLANTABLE CARDIOVERTER DEFIBRILLATOR  (CRT-D);  Surgeon: Evans Lance, MD;  Location: [facility] CATH LAB;  Service: Cardiovascular;  Laterality: N/A;   BI-VENTRICULAR IMPLANTABLE CARDIOVERTER DEFIBRILLATOR  (CRT-D)  11/18/2012   BIV ICD GENERATOR CHANGEOUT N/A 11/22/2020   Procedure: BIV ICD GENERATOR CHANGEOUT;  Surgeon: Evans Lance, MD;  Location: [place]  CV LAB;  Service: Cardiovascular;  Laterality: N/A;   CARDIAC CATHETERIZATION  03/04/12   nonobstructive CAD, elevated LVEDP and tortuous vessels suggestive of long-standing hypertension   COLONOSCOPY WITH PROPOFOL N/A 12/12/2016   Procedure: COLONOSCOPY WITH PROPOFOL;  Surgeon: Carol Ada, MD;  Location: [facility] ENDOSCOPY;  Service:  Endoscopy;  Laterality: N/A;   JOINT REPLACEMENT     Bilateral hip and right knee   LEFT HEART CATH N/A 03/05/2012   Procedure: LEFT HEART CATH;  Surgeon: Larey Dresser, MD;  Location: [facility] CATH LAB;  Service: Cardiovascular;  Laterality: N/A;     OB History   No obstetric history on file.     Family History  Problem Relation Age of Onset   Heart failure Mother    Heart disease Neg Hx     Social History   Tobacco Use   Smoking status: Former    Packs/day: 1.00    Years: 40.00    Pack years: 40.00    Types: Cigarettes    Start date: 06/23/1972    Quit date: 03/26/2012    Years since quitting: 8.8   Smokeless tobacco: Never  Vaping Use   Vaping Use: Never used  Substance Use Topics   Alcohol use: No   Drug use: No    Home Medications Prior to Admission medications   Medication Sig Start Date End Date Taking? Authorizing Provider  cyclobenzaprine (FLEXERIL) 10 MG tablet Take 1 tablet (10 mg total) by mouth 2 (two) times daily as needed for muscle spasms. 01/24/21  Yes Marcello Fennel, PA-C  albuterol (VENTOLIN HFA) 108 (90 Base) MCG/ACT inhaler Inhale 2 puffs into the lungs every 6 (six) hours as needed for wheezing or shortness of breath. 02/06/20   Kinnie Feil, PA-C  apixaban (ELIQUIS) 5 MG TABS tablet TAKE 1 TABLET(5 MG) BY MOUTH TWICE DAILY 08/31/20   Larey Dresser, MD  atorvastatin (LIPITOR) 40 MG tablet TAKE 1 TABLET(40 MG) BY MOUTH DAILY 06/07/20   Larey Dresser, MD  baclofen (LIORESAL) 10 MG tablet Take 10 mg by mouth 2 (two) times daily. 02/08/18   [provider]  carvedilol (COREG) 25 MG tablet TAKE ONE TABLET BY MOUTH TWICE A DAY WITH MEALS 01/03/21   Larey Dresser, MD  ENTRESTO 97-103 MG TAKE ONE TABLET BY MOUTH TWICE A DAY 01/03/21   Larey Dresser, MD  furosemide (LASIX) 20 MG tablet Take 3 tablets (60 mg total) by mouth daily. 08/15/20   [place], Maricela Bo, FNP  gabapentin (NEURONTIN) 100 MG capsule Take 100 mg by mouth 3 (three) times  daily. 03/10/18   [provider]  glucose blood (FREESTYLE TEST STRIPS) test strip Use as instructed 04/25/16   Ghimire, Henreitta Leber, MD  glucose monitoring kit (FREESTYLE) monitoring kit 1 each by Does not apply route 4 (four) times daily - after meals and at bedtime. 1 month Diabetic Testing Supplies for QAC-QHS accuchecks. 04/25/16   Ghimire, Henreitta Leber, MD  HUMALOG KWIKPEN 100 UNIT/ML KwikPen Sliding scale 02/12/18   [provider]  insulin glargine (LANTUS) 100 UNIT/ML Solostar Pen Inject 50 Units into the skin at bedtime.    [provider]  Insulin Pen Needle 32G X 8 MM MISC Use as directed 06/18/16   Arbutus Leas, NP  isosorbide-hydrALAZINE (BIDIL) 20-37.5 MG tablet Take 2 tablets by mouth 3 (three) times daily. 08/15/20   Rafael Bihari, FNP  Lancets (FREESTYLE) lancets Use as instructed 06/18/16   Arbutus Leas, NP  metFORMIN (GLUMETZA) 1000 MG (MOD) 24 hr tablet Take 1,000 mg by mouth in the morning and at bedtime.    [provider]  ondansetron (ZOFRAN) 4 MG tablet Take 1 tablet (4 mg total) by mouth every 6 (six) hours. 05/18/20   Valarie Merino, MD  Oxycodone HCl 10 MG TABS Take 10 mg by mouth 3 (three) times daily as needed (pain). 01/09/20   [provider]  potassium chloride SA (KLOR-CON) 10 MEQ tablet Take 6 tablets (60 mEq total) by mouth daily. Patient not taking: Reported on 11/20/2020 08/15/20 11/13/20  Rafael Bihari, FNP  spironolactone (ALDACTONE) 25 MG tablet TAKE 1 TABLET(25 MG) BY MOUTH DAILY 08/15/20   [place], Maricela Bo, FNP  Tafamidis (VYNDAMAX) 61 MG CAPS TAKE 1 CAPSULE (61 MG) BY MOUTH DAILY. 10/11/20 10/11/21  Larey Dresser, MD    Allergies    Morphine and related and Penicillins  Review of Systems   Review of Systems  Constitutional:  Negative for chills and fever.  HENT:  Negative for congestion.   Respiratory:  Negative for shortness of breath.   Cardiovascular:  Negative for chest pain.  Gastrointestinal:   Negative for abdominal pain.  Genitourinary:  Negative for enuresis.  Musculoskeletal:  Negative for back pain.       Hip pain right-sided.  Skin:  Negative for rash.  Neurological:  Negative for dizziness.  Hematological:  Does not bruise/bleed easily.   Physical Exam Updated Vital Signs BP (!) 189/82 (BP Location: Left Arm) Comment: Patient states she did not take her BP medicine today.   Pulse 81    Temp 98.3 F (36.8 C) (Oral)    Resp 16    Ht '5\' 7"'  (1.702 m)    Wt 108.9 kg    SpO2 97%    BMI 37.59 kg/m   Physical Exam Vitals and nursing note reviewed.  Constitutional:      General: She is not in acute distress.    Appearance: Normal appearance. She is not ill-appearing or diaphoretic.  HENT:     Head: Normocephalic and atraumatic.     Nose: No congestion or rhinorrhea.  Eyes:     General: No scleral icterus.       Right eye: No discharge.        Left eye: No discharge.     Conjunctiva/sclera: Conjunctivae normal.  Pulmonary:     Effort: Pulmonary effort is normal. No respiratory distress.     Breath sounds: Normal breath sounds. No wheezing.  Abdominal:     Palpations: Abdomen is soft.     Tenderness: There is no abdominal tenderness. There is no right CVA tenderness or left CVA tenderness.  Musculoskeletal:     Cervical back: Neck supple.     Right lower leg: No edema.     Left lower leg: No edema.     Comments: Spine was palpated was nontender to palpation, no step-off deformities present, there is no skin change present my exam.  There is no leg shortening, no internal/external rotation present, no pelvis instability present.  She has full range of motion, 5 of 5 strength, neuro vas intact in lower extremities, she has 2+ patellar reflexes.  Skin:    General: Skin is warm and dry.     Coloration: Skin is not jaundiced or pale.  Neurological:     Mental Status: She is alert.  Psychiatric:        Mood and Affect: Mood normal.    ED  Results / Procedures /  Treatments   Labs (all labs ordered are listed, but only abnormal results are displayed) Labs Reviewed - No data to display  EKG None  Radiology DG Hip Unilat W or Wo Pelvis 2-3 Views Right  Result Date: 01/24/2021 CLINICAL DATA:  Acute right hip pain without known injury EXAM: DG HIP (WITH OR WITHOUT PELVIS) 2-3V RIGHT COMPARISON:  None. FINDINGS: There is no evidence of hip fracture or dislocation. Status post bilateral total hip arthroplasties. IMPRESSION: No acute abnormality noted. Electronically Signed   By: Marijo Conception M.D.   On: 01/24/2021 12:30    Procedures Procedures   Medications Ordered in ED Medications  cyclobenzaprine (FLEXERIL) tablet 10 mg (10 mg Oral Given 01/24/21 1201)    ED Course  I have reviewed the triage vital signs and the nursing notes.  Pertinent labs & imaging results that were available during my care of the patient were reviewed by me and considered in my medical decision making (see chart for details).    MDM Rules/Calculators/A&P                         Initial impression-presents with right-sided hip pain.  She is alert, no acute stress vital signs reassuring.  Will obtain imaging for further evaluation.  Work-up-x-rays negative for acute findings.  Rule out- I have low suspicion for spinal fracture or spinal cord abnormality as patient denies urinary incontinency, retention, difficulty with bowel movements, denies saddle paresthesias.  Spine was palpated there is no step-off, crepitus or gross deformities felt, patient had 5/5 strength, full range of motion, neurovascular fully intact in the lower extremities.  Low suspicion for pelvis fracture or femur fracture as there is no leg shortening, no internal or external rotation present, imaging is negative for acute findings.. Low suspicion for septic arthritis as patient denies IV drug use, skin exam was performed no erythematous, edema or warm joints noted.   Plan-  Right-sided hip  pain-likely muscular in nature, will recommend over-the-counter pain medication, will provide her with muscle relaxers, follow-up with PCP as needed.  Gave strict return precautions.  Vital signs have remained stable, no indication for hospital admission  Patient given at home care as well strict return precautions.  Patient verbalized that they understood agreed to said plan.     Final Clinical Impression(s) / ED Diagnoses Final diagnoses:  Right hip pain    Rx / DC Orders ED Discharge Orders          Ordered    cyclobenzaprine (FLEXERIL) 10 MG tablet  2 times daily PRN        01/24/21 1341             Aron Baba 01/24/21 1342    Regan Lemming, MD 01/24/21 1702

## 2021-01-29 ENCOUNTER — Ambulatory Visit: Payer: Medicare Other

## 2021-01-29 ENCOUNTER — Telehealth (HOSPITAL_COMMUNITY): Payer: Self-pay | Admitting: Pharmacist

## 2021-01-29 ENCOUNTER — Other Ambulatory Visit: Payer: Self-pay

## 2021-01-29 NOTE — Telephone Encounter (Signed)
Attempted to submit PA for Vyndamax. Received message from [facility]: This medication or product was previously approved on [id number] from 2021-01-27 to 2022-01-26.   Audry Riles, PharmD, BCPS, BCCP, CPP [facility] Pharmacist [phone]

## 2021-01-31 ENCOUNTER — Ambulatory Visit: Payer: Medicare Other | Attending: Internal Medicine

## 2021-01-31 ENCOUNTER — Other Ambulatory Visit: Payer: Self-pay

## 2021-01-31 DIAGNOSIS — R262 Difficulty in walking, not elsewhere classified: Secondary | ICD-10-CM | POA: Insufficient documentation

## 2021-01-31 DIAGNOSIS — G8929 Other chronic pain: Secondary | ICD-10-CM | POA: Diagnosis present

## 2021-01-31 DIAGNOSIS — M6281 Muscle weakness (generalized): Secondary | ICD-10-CM | POA: Diagnosis present

## 2021-01-31 DIAGNOSIS — M5442 Lumbago with sciatica, left side: Secondary | ICD-10-CM | POA: Insufficient documentation

## 2021-01-31 DIAGNOSIS — Z9181 History of falling: Secondary | ICD-10-CM | POA: Insufficient documentation

## 2021-01-31 DIAGNOSIS — M5386 Other specified dorsopathies, lumbar region: Secondary | ICD-10-CM | POA: Diagnosis present

## 2021-01-31 NOTE — Therapy (Signed)
OUTPATIENT PHYSICAL THERAPY TREATMENT NOTE   Patient Name: [name] MRN: [id number] DOB:02-19-1950, 71 y.o., female Today's Date: 01/31/2021  PCP: Sandi Mariscal, MD REFERRING PROVIDER: Sandi Mariscal, MD   PT End of Session - 01/31/21 0846     Visit Number 4    Number of Visits 17    Date for PT Re-Evaluation 03/09/21    Authorization Type [employer] MEDICARE    Authorization Time Period Reassess FOTO on 6th and 11th visits    Progress Note Due on Visit 10    PT Start Time [phone]    PT Stop Time 0930    PT Time Calculation (min) 44 min    Activity Tolerance Patient limited by pain    Behavior During Therapy [facility] for tasks assessed/performed              Past Medical History:  Diagnosis Date   Anemia    a. Noted on 07/2012 labs, instructed to f/u PCP.   Arthritis    "joints" (11/18/2012)   CAD (coronary artery disease), native coronary artery    a. Nonobstructive by cath 02/2012 (done because of low EF).   Chronic bronchitis ([place])    "~ every other year" (11/18/2012)   Chronic combined systolic and diastolic CHF (congestive heart failure) ([place])    a. 03/05/12 echo:  LVEF 20-25%, moderate LVH , inferior and basal to mid septal akinesis, anterior moderate to severe hypokinesis and grade 2 diastolic dysfunction. b. EF 07/2012: EF still 25% (unclear medication compliance).   Chronic lower back pain    Headache(784.0)    "often; maybe not daily" (11/18/2012)   High cholesterol    History of noncompliance with medical treatment    Hypertension    LBBB (left bundle branch block)    Orthopnea    Tobacco abuse    Type II diabetes mellitus ([place])    Past Surgical History:  Procedure Laterality Date   BI-VENTRICULAR IMPLANTABLE CARDIOVERTER DEFIBRILLATOR N/A 11/18/2012   Procedure: BI-VENTRICULAR IMPLANTABLE CARDIOVERTER DEFIBRILLATOR  (CRT-D);  Surgeon: Evans Lance, MD;  Location: [facility] CATH LAB;  Service: Cardiovascular;  Laterality: N/A;   BI-VENTRICULAR IMPLANTABLE CARDIOVERTER  DEFIBRILLATOR  (CRT-D)  11/18/2012   BIV ICD GENERATOR CHANGEOUT N/A 11/22/2020   Procedure: BIV ICD GENERATOR CHANGEOUT;  Surgeon: Evans Lance, MD;  Location: [place] CV LAB;  Service: Cardiovascular;  Laterality: N/A;   CARDIAC CATHETERIZATION  03/04/12   nonobstructive CAD, elevated LVEDP and tortuous vessels suggestive of long-standing hypertension   COLONOSCOPY WITH PROPOFOL N/A 12/12/2016   Procedure: COLONOSCOPY WITH PROPOFOL;  Surgeon: Carol Ada, MD;  Location: [facility] ENDOSCOPY;  Service: Endoscopy;  Laterality: N/A;   JOINT REPLACEMENT     Bilateral hip and right knee   LEFT HEART CATH N/A 03/05/2012   Procedure: LEFT HEART CATH;  Surgeon: Larey Dresser, MD;  Location: [facility] CATH LAB;  Service: Cardiovascular;  Laterality: N/A;   Patient Active Problem List   Diagnosis Date Noted   Right foot infection 10/22/2017   Cellulitis in diabetic foot ([place]) 10/22/2017   Hyperglycemia 10/22/2017   Lumbar radiculopathy 09/18/2017   Degenerative spondylolisthesis 09/18/2017   Atrial fibrillation ([place]) 02/11/2017   Paroxysmal A-fib (HCC)    Biventricular automatic implantable cardioverter defibrillator in situ    Sepsis ([place]) 04/20/2016   UTI (urinary tract infection) 04/20/2016   Dyspnea 07/19/2015   Interstitial lung disease ([place]) 11/20/2014   SIRS (systemic inflammatory response syndrome) ([place]) 02/03/2014   IDDM (insulin dependent diabetes mellitus) 02/03/2014  Tachycardia 54/09/8117   Chronic systolic CHF (congestive heart failure) ([place]) 09/29/2012   Hypoxia 03/06/2012   Hypertensive heart disease 03/06/2012   Nonischemic cardiomyopathy ([place]) 03/06/2012   Tobacco abuse 03/06/2012   Type 2 diabetes mellitus ([place]) 03/06/2012   Hypertension 03/06/2012   CAD (coronary artery disease), native coronary artery 03/06/2012   Hypokalemia 03/06/2012   Acute bronchitis 02/01/2009   Sleep apnea 02/01/2009   Chest pain 02/01/2009    REFERRING DIAG: Risks for falls  THERAPY DIAG:     1.   Chronic bilateral low back pain with left-sided sciatica M54.42 ...  Change Dx     2.   Muscle weakness (generalized) M62.81  Change Dx     3.   Difficulty in walking, not elsewhere classified R26.2  Change Dx     4.   Decreased ROM of lumbar spine M53.86  Change Dx     5.   Risk for falls             PERTINENT HISTORY: Bilat THR, and R TKR, obesity, See Snapshot  PRECAUTIONS: None  SUBJECTIVE: Pt does not feel like she is getting better  PAIN:  Are you having pain? Yes VAS scale: 10/10, pt states she does not need to go to the ED Pain location: Low back  Pain orientation: Right PAIN TYPE: aching Pain description: intermittent  Aggravating factors: Standing and walking Relieving factors: Sitting and lying down  OBJECTIVE:    DIAGNOSTIC FINDINGS: none   PATIENT SURVEYS:  FOTO To be assessed   COGNITION:          Overall cognitive status: Within functional limits for tasks assessed                        SENSATION:          Light touch: Deficits decrease for lower legs and feet- stocking pattern     POSTURE:  Forward head, decreased thoracic kyphosis, increased lumbar lordosis, anterior pelvic tilt   LE AROM/PROM:   Trunk AROM: Markedly decreased with all motions due to pain   LE MMT:   MMT Right 01/06/2021 Left 01/06/2021  Hip flexion 3 2  Hip extension 3 2+  Hip abduction 3 2+  Hip adduction 3 3  Hip internal rotation 3 2+  Hip external rotation 3 2+  Knee flexion 3 3  Knee extension 3+ 2+  Ankle dorsiflexion      Ankle plantarflexion      Ankle inversion      Ankle eversion       (Blank rows = not tested)     FUNCTIONAL TESTS:  Pt was not able to SLS L or R for greater than 1 sec   GAIT: Distance walked: 100 ft Assistive device utilized: None, needs SPC or RW Level of assistance: Modified independence Comments: Decreased pace and step length. Antalgic pattern over the L LE. 2WMT on 01/10/21: 158ft with decreased pace ans step  length       TODAY'S TREATMENT: [facility] Adult PT Treatment:     01/31/2021 Therapeutic Exercise: - Seated forward rollouts c physioball x2 forward and laterally L and R - Supine PPT 2x10, 3" - supine marching 2x10 - HL clamshell 2x10,3", RTB - HL hip add sets c ball x15, 5"  Manual : Manual traction for the low back with traction both LE supine with LE on physioball and to the R LE c the L LE in HL. 20 sec on,  10 sec off for 10 mins   Self Care:  Reinterated with pt with her level of LE and core weakness, her weight, and degenerative changes in her low back, it will take time and consistent completion of her HEP to see improvement.   01/07/21: - Supine PPT 2x10, 3" - supine marching 2x10 - HL clamshell 2x10,3", RTB - HL hip add sets c ball x15, 5" - Seated forward rollouts c physioball x2 forward and laterally L and R - LTR, 5x, 5" -  supine bridging x10, 3" due to LBP        PATIENT EDUCATION:  Education details: Reviewed and completed HEP and updated for PPT and LTR.  Person educated: Patient Education method: Explanation, Demonstration, Tactile cues, Verbal cues, and Handouts Education comprehension: verbalized understanding, returned demonstration, verbal cues required, and tactile cues required     HOME EXERCISE PROGRAM:  Access Code: [email or web address] URL: [email or web address] Date: 01/31/2021 Prepared by: Gar Ponto  Exercises Supine Posterior Pelvic Tilt - 2 x daily - 7 x weekly - 1 sets - 10 reps - 3 hold Supine March - 2 x daily - 7 x weekly - 1 sets - 10 reps Hooklying Clamshell with Resistance - 2 x daily - 7 x weekly - 1 sets - 10 reps - 3 hold Supine Hip Adduction Isometric with Ball - 2 x daily - 7 x weekly - 1 sets - 10 reps - 5 hold Seated Flexion Stretch with Swiss Ball - 2 x daily - 7 x weekly - 1 sets - 10 reps - 5 hold Supine Lower Trunk Rotation - 2 x daily - 7 x weekly - 1 sets - 5 reps - 5 hold Hooklying Single Knee to Chest Stretch - 2 x  daily - 7 x weekly - 1 sets - 3 reps - 10 hold        ASSESSMENT:   CLINICAL IMPRESSION: Pt returns to PT for the first time since mid December. Pt reports she is completing her exs daily. R low back pain was reduced pain to 7/10 with lumbar manual traction and flexion biased exs. Upon standing and walking, pt reported an increase of R low back pain. Pt's is currently not progressing with pt. Recommended consistent completion of PT for best opportunity for improvement.   REHAB POTENTIAL: Fair    CLINICAL DECISION MAKING: Evolving/moderate complexity   EVALUATION COMPLEXITY: Moderate     GOALS:     SHORT TERM GOALS:   STG Name Target Date Goal status  1 Pt will be Ind in an initial HEP Baseline: started on eval 01/26/21 INITIAL  2 Pt will voice understanding of measures to assist in the decrease and management of pain Baseline:  01/26/21 INITIAL    LONG TERM GOALS:    LTG Name Target Date Goal status  1 Pt will be Ind in a final HEP to maintain achieved LOF Baseline:       03/09/21   INITIAL  2 Pt trunk ROM will improve to only moderate limitation as indication of decreased pain Baseline: Marked limitation 03/09/21 INITIAL  3 Pt's will report improved to LBP and L LE pain to 5/10 or less with daily activities Baseline:8/10 03/09/21 INITIAL  4 Pt will demonstrate improved L LE strength to 3/5 or greater and R LE strength to 4/5 or greater for improved functional mobility Baseline: see flowsheets 03/09/21 INITIAL  5 Pt's walking tolerance will improve to 37ft s rest c or s and AD Baseline: 03/09/21 INITIAL  6 Pt's 2MWT will increase to 200+ ft s AD as indication of improve strength, balance, and pain. Assess Berg and set goal Baseline: 2MWT 169ft 03/09/21 INITIAL    PLAN: PT FREQUENCY: 2x/week   PT DURATION: 8 weeks   PLANNED INTERVENTIONS: Therapeutic exercises, Therapeutic activity, Neuro Muscular re-education, Balance training, Gait training, Patient/Family education,  Joint mobilization, Stair training, Dry Needling, Electrical stimulation, Spinal mobilization, Cryotherapy, Moist heat, Taping, Traction, Ultrasound, Ionotophoresis 4mg /ml Dexamethasone, and Manual therapy   PLAN FOR NEXT SESSION: Complete FOTO, assess response to HEP. Progress therex as indicated, assess Berg and set goals.     Wilmary Levit MS, PT 01/31/21 11:24 AM

## 2021-02-05 ENCOUNTER — Ambulatory Visit: Payer: Medicare Other

## 2021-02-07 ENCOUNTER — Other Ambulatory Visit (HOSPITAL_COMMUNITY): Payer: Self-pay

## 2021-02-07 ENCOUNTER — Ambulatory Visit: Payer: Medicare Other

## 2021-02-07 ENCOUNTER — Other Ambulatory Visit: Payer: Self-pay

## 2021-02-07 DIAGNOSIS — Z9181 History of falling: Secondary | ICD-10-CM

## 2021-02-07 DIAGNOSIS — R262 Difficulty in walking, not elsewhere classified: Secondary | ICD-10-CM

## 2021-02-07 DIAGNOSIS — M6281 Muscle weakness (generalized): Secondary | ICD-10-CM

## 2021-02-07 DIAGNOSIS — M5442 Lumbago with sciatica, left side: Secondary | ICD-10-CM | POA: Diagnosis not present

## 2021-02-07 DIAGNOSIS — G8929 Other chronic pain: Secondary | ICD-10-CM

## 2021-02-07 DIAGNOSIS — M5386 Other specified dorsopathies, lumbar region: Secondary | ICD-10-CM

## 2021-02-07 NOTE — Therapy (Signed)
OUTPATIENT PHYSICAL THERAPY TREATMENT NOTE   Patient Name: [name] MRN: [id number] DOB:March 26, 1950, 71 y.o., female Today's Date: 02/07/2021  PCP: Sandi Mariscal, MD REFERRING PROVIDER: Jeanella Anton, NP      Past Medical History:  Diagnosis Date   Anemia    a. Noted on 07/2012 labs, instructed to f/u PCP.   Arthritis    "joints" (11/18/2012)   CAD (coronary artery disease), native coronary artery    a. Nonobstructive by cath 02/2012 (done because of low EF).   Chronic bronchitis ([place])    "~ every other year" (11/18/2012)   Chronic combined systolic and diastolic CHF (congestive heart failure) ([place])    a. 03/05/12 echo:  LVEF 20-25%, moderate LVH , inferior and basal to mid septal akinesis, anterior moderate to severe hypokinesis and grade 2 diastolic dysfunction. b. EF 07/2012: EF still 25% (unclear medication compliance).   Chronic lower back pain    Headache(784.0)    "often; maybe not daily" (11/18/2012)   High cholesterol    History of noncompliance with medical treatment    Hypertension    LBBB (left bundle branch block)    Orthopnea    Tobacco abuse    Type II diabetes mellitus ([place])    Past Surgical History:  Procedure Laterality Date   BI-VENTRICULAR IMPLANTABLE CARDIOVERTER DEFIBRILLATOR N/A 11/18/2012   Procedure: BI-VENTRICULAR IMPLANTABLE CARDIOVERTER DEFIBRILLATOR  (CRT-D);  Surgeon: Evans Lance, MD;  Location: [facility] CATH LAB;  Service: Cardiovascular;  Laterality: N/A;   BI-VENTRICULAR IMPLANTABLE CARDIOVERTER DEFIBRILLATOR  (CRT-D)  11/18/2012   BIV ICD GENERATOR CHANGEOUT N/A 11/22/2020   Procedure: BIV ICD GENERATOR CHANGEOUT;  Surgeon: Evans Lance, MD;  Location: [place] CV LAB;  Service: Cardiovascular;  Laterality: N/A;   CARDIAC CATHETERIZATION  03/04/12   nonobstructive CAD, elevated LVEDP and tortuous vessels suggestive of long-standing hypertension   COLONOSCOPY WITH PROPOFOL N/A 12/12/2016   Procedure: COLONOSCOPY WITH PROPOFOL;  Surgeon:  Carol Ada, MD;  Location: [facility] ENDOSCOPY;  Service: Endoscopy;  Laterality: N/A;   JOINT REPLACEMENT     Bilateral hip and right knee   LEFT HEART CATH N/A 03/05/2012   Procedure: LEFT HEART CATH;  Surgeon: Larey Dresser, MD;  Location: [facility] CATH LAB;  Service: Cardiovascular;  Laterality: N/A;   Patient Active Problem List   Diagnosis Date Noted   Right foot infection 10/22/2017   Cellulitis in diabetic foot ([place]) 10/22/2017   Hyperglycemia 10/22/2017   Lumbar radiculopathy 09/18/2017   Degenerative spondylolisthesis 09/18/2017   Atrial fibrillation ([place]) 02/11/2017   Paroxysmal A-fib (HCC)    Biventricular automatic implantable cardioverter defibrillator in situ    Sepsis ([place]) 04/20/2016   UTI (urinary tract infection) 04/20/2016   Dyspnea 07/19/2015   Interstitial lung disease ([place]) 11/20/2014   SIRS (systemic inflammatory response syndrome) ([place]) 02/03/2014   IDDM (insulin dependent diabetes mellitus) 02/03/2014   Tachycardia 69/48/5462   Chronic systolic CHF (congestive heart failure) ([place]) 09/29/2012   Hypoxia 03/06/2012   Hypertensive heart disease 03/06/2012   Nonischemic cardiomyopathy ([place]) 03/06/2012   Tobacco abuse 03/06/2012   Type 2 diabetes mellitus ([place]) 03/06/2012   Hypertension 03/06/2012   CAD (coronary artery disease), native coronary artery 03/06/2012   Hypokalemia 03/06/2012   Acute bronchitis 02/01/2009   Sleep apnea 02/01/2009   Chest pain 02/01/2009    REFERRING DIAG: Risks for falls  THERAPY DIAG:    1.   Chronic bilateral low back pain with left-sided sciatica M54.42 ...  Change Dx     2.  Muscle weakness (generalized) M62.81  Change Dx     3.   Difficulty in walking, not elsewhere classified R26.2  Change Dx     4.   Decreased ROM of lumbar spine M53.86  Change Dx     5.   Risk for falls             PERTINENT HISTORY: Bilat THR, and R TKR, obesity, See Snapshot  PRECAUTIONS: None  SUBJECTIVE: I'm feeling better. Pain is mostly in my  R low back and hip.  PAIN:  Are you having pain? Yes VAS scale: 7/10 Pain location: Low back and hip Pain orientation: Right PAIN TYPE: aching Pain description: intermittent  Aggravating factors: Standing and walking Relieving factors: Sitting and lying down  OBJECTIVE:    DIAGNOSTIC FINDINGS: none     COGNITION:          Overall cognitive status: Within functional limits for tasks assessed                        SENSATION:          Light touch: Deficits decrease for lower legs and feet- stocking pattern     POSTURE:  Forward head, decreased thoracic kyphosis, increased lumbar lordosis, anterior pelvic tilt   LE AROM/PROM:   Trunk AROM: Markedly decreased with all motions due to pain   LE MMT:   MMT Right 01/06/2021 Left 01/06/2021  Hip flexion 3 2  Hip extension 3 2+  Hip abduction 3 2+  Hip adduction 3 3  Hip internal rotation 3 2+  Hip external rotation 3 2+  Knee flexion 3 3  Knee extension 3+ 2+  Ankle dorsiflexion      Ankle plantarflexion      Ankle inversion      Ankle eversion       (Blank rows = not tested)     FUNCTIONAL TESTS:  Pt was not able to SLS L or R for greater than 1 sec   GAIT: Distance walked: 100 ft Assistive device utilized: None, needs SPC or RW Level of assistance: Modified independence Comments: Decreased pace and step length. Antalgic pattern over the L LE. 2WMT on 01/10/21: 134ft with decreased pace ans step length   [facility] Adult PT Treatment:                                                DATE: 02/07/21 Therapeutic Exercise: - Seated forward rollouts c physioball x2 forward and laterally L and R - Supine PPT 2x10, 3" - supine marching 2x10 - HL clamshell 2x10,3", GTB - HL hip add sets c ball x15, 5" - Seated LAQ c DF, x10,3" - Seated LAQ, 2x10, 4# - Seated hip side step, 2x10, 4# - Marching, 2x20, 4#  Modalities: MH to the low back and R hip x15 mins     TODAY'S TREATMENT: [facility] Adult PT Treatment:      02/07/2021 Therapeutic Exercise: - Seated forward rollouts c physioball x2 forward and laterally L and R - Supine PPT 2x10, 3" - supine marching 2x10 - HL clamshell 2x10,3", RTB - HL hip add sets c ball x15, 5"  Manual : Manual traction for the low back with traction both LE supine with LE on physioball and to the R LE c the L LE in HL. 20 sec  on, 10 sec off for 10 mins   Self Care:  Reinterated with pt with her level of LE and core weakness, her weight, and degenerative changes in her low back, it will take time and consistent completion of her HEP to see improvement.   01/07/21: - Supine PPT 2x10, 3" - supine marching 2x10 - HL clamshell 2x10,3", RTB - HL hip add sets c ball x15, 5" - Seated forward rollouts c physioball x2 forward and laterally L and R - LTR, 5x, 5" -  supine bridging x10, 3" due to LBP        PATIENT EDUCATION:  Education details: Reviewed and completed HEP and updated for PPT and LTR.  Person educated: Patient Education method: Explanation, Demonstration, Tactile cues, Verbal cues, and Handouts Education comprehension: verbalized understanding, returned demonstration, verbal cues required, and tactile cues required     HOME EXERCISE PROGRAM:  Access Code: [email or web address] URL: [email or web address] Date: 01/31/2021 Prepared by: Gar Ponto  Exercises Supine Posterior Pelvic Tilt - 2 x daily - 7 x weekly - 1 sets - 10 reps - 3 hold Supine March - 2 x daily - 7 x weekly - 1 sets - 10 reps Hooklying Clamshell with Resistance - 2 x daily - 7 x weekly - 1 sets - 10 reps - 3 hold Supine Hip Adduction Isometric with Ball - 2 x daily - 7 x weekly - 1 sets - 10 reps - 5 hold Seated Flexion Stretch with Swiss Ball - 2 x daily - 7 x weekly - 1 sets - 10 reps - 5 hold Supine Lower Trunk Rotation - 2 x daily - 7 x weekly - 1 sets - 5 reps - 5 hold Hooklying Single Knee to Chest Stretch - 2 x daily - 7 x weekly - 1 sets - 3 reps - 10 hold         ASSESSMENT:   CLINICAL IMPRESSION: Pt was completed for lumbopelvic/LE strengthening. Pt is obese with weak lower extremities, so strength improvement is key to the patient's progress. Anticipate pt's progress to be at a slower pace. Pt will benefit from continued skilled PT to improve deficits and optimize function.   REHAB POTENTIAL: Fair    CLINICAL DECISION MAKING: Evolving/moderate complexity   EVALUATION COMPLEXITY: Moderate     GOALS:     SHORT TERM GOALS:   STG Name Target Date Goal status  1 Pt will be Ind in an initial HEP Baseline: started on eval 01/26/21 INITIAL  2 Pt will voice understanding of measures to assist in the decrease and management of pain Baseline:  01/26/21 INITIAL    LONG TERM GOALS:    LTG Name Target Date Goal status  1 Pt will be Ind in a final HEP to maintain achieved LOF Baseline:       03/09/21   INITIAL  2 Pt trunk ROM will improve to only moderate limitation as indication of decreased pain Baseline: Marked limitation 03/09/21 INITIAL  3 Pt's will report improved to LBP and L LE pain to 5/10 or less with daily activities Baseline:8/10 03/09/21 INITIAL  4 Pt will demonstrate improved L LE strength to 3/5 or greater and R LE strength to 4/5 or greater for improved functional mobility Baseline: see flowsheets 03/09/21 INITIAL  5 Pt's walking tolerance will improve to 353ft s rest c or s and AD Baseline: 03/09/21 INITIAL  6 Pt's 2MWT will increase to 200+ ft s AD as indication of improve strength, balance, and pain. Assess Merrilee Jansky  and set goal Baseline: 2MWT 138ft 03/09/21 INITIAL    PLAN: PT FREQUENCY: 2x/week   PT DURATION: 8 weeks   PLANNED INTERVENTIONS: Therapeutic exercises, Therapeutic activity, Neuro Muscular re-education, Balance training, Gait training, Patient/Family education, Joint mobilization, Stair training, Dry Needling, Electrical stimulation, Spinal mobilization, Cryotherapy, Moist heat, Taping, Traction, Ultrasound,  Ionotophoresis 4mg /ml Dexamethasone, and Manual therapy   PLAN FOR NEXT SESSION: Progress therex as indicated, assess Berg and set goals. Review HEP.     Polette Nofsinger MS, PT 02/07/21 1:01 PM

## 2021-02-12 ENCOUNTER — Other Ambulatory Visit: Payer: Self-pay

## 2021-02-12 ENCOUNTER — Ambulatory Visit: Payer: Medicare Other

## 2021-02-12 DIAGNOSIS — M5442 Lumbago with sciatica, left side: Secondary | ICD-10-CM

## 2021-02-12 DIAGNOSIS — Z9181 History of falling: Secondary | ICD-10-CM

## 2021-02-12 DIAGNOSIS — R262 Difficulty in walking, not elsewhere classified: Secondary | ICD-10-CM

## 2021-02-12 DIAGNOSIS — G8929 Other chronic pain: Secondary | ICD-10-CM

## 2021-02-12 DIAGNOSIS — M6281 Muscle weakness (generalized): Secondary | ICD-10-CM

## 2021-02-12 DIAGNOSIS — M5386 Other specified dorsopathies, lumbar region: Secondary | ICD-10-CM

## 2021-02-12 NOTE — Therapy (Addendum)
OUTPATIENT PHYSICAL THERAPY TREATMENT NOTE   Patient Name: [name] MRN: [id number] DOB:1951/01/03, 71 y.o., female Today's Date: 02/12/2021  PCP: Sandi Mariscal, MD REFERRING PROVIDER: Jeanella Anton, NP   PT End of Session - 02/12/21 0901     Visit Number 6    Number of Visits 17    Date for PT Re-Evaluation 03/09/21    Authorization Type [employer] MEDICARE    Authorization Time Period --    Progress Note Due on Visit 10    PT Start Time 0855    PT Stop Time 0940    PT Time Calculation (min) 45 min    Activity Tolerance Patient limited by pain    Behavior During Therapy [facility] for tasks assessed/performed               Past Medical History:  Diagnosis Date   Anemia    a. Noted on 07/2012 labs, instructed to f/u PCP.   Arthritis    "joints" (11/18/2012)   CAD (coronary artery disease), native coronary artery    a. Nonobstructive by cath 02/2012 (done because of low EF).   Chronic bronchitis ([place])    "~ every other year" (11/18/2012)   Chronic combined systolic and diastolic CHF (congestive heart failure) ([place])    a. 03/05/12 echo:  LVEF 20-25%, moderate LVH , inferior and basal to mid septal akinesis, anterior moderate to severe hypokinesis and grade 2 diastolic dysfunction. b. EF 07/2012: EF still 25% (unclear medication compliance).   Chronic lower back pain    Headache(784.0)    "often; maybe not daily" (11/18/2012)   High cholesterol    History of noncompliance with medical treatment    Hypertension    LBBB (left bundle branch block)    Orthopnea    Tobacco abuse    Type II diabetes mellitus ([place])    Past Surgical History:  Procedure Laterality Date   BI-VENTRICULAR IMPLANTABLE CARDIOVERTER DEFIBRILLATOR N/A 11/18/2012   Procedure: BI-VENTRICULAR IMPLANTABLE CARDIOVERTER DEFIBRILLATOR  (CRT-D);  Surgeon: Evans Lance, MD;  Location: [facility] CATH LAB;  Service: Cardiovascular;  Laterality: N/A;   BI-VENTRICULAR IMPLANTABLE CARDIOVERTER DEFIBRILLATOR  (CRT-D)   11/18/2012   BIV ICD GENERATOR CHANGEOUT N/A 11/22/2020   Procedure: BIV ICD GENERATOR CHANGEOUT;  Surgeon: Evans Lance, MD;  Location: [place] CV LAB;  Service: Cardiovascular;  Laterality: N/A;   CARDIAC CATHETERIZATION  03/04/12   nonobstructive CAD, elevated LVEDP and tortuous vessels suggestive of long-standing hypertension   COLONOSCOPY WITH PROPOFOL N/A 12/12/2016   Procedure: COLONOSCOPY WITH PROPOFOL;  Surgeon: Carol Ada, MD;  Location: [facility] ENDOSCOPY;  Service: Endoscopy;  Laterality: N/A;   JOINT REPLACEMENT     Bilateral hip and right knee   LEFT HEART CATH N/A 03/05/2012   Procedure: LEFT HEART CATH;  Surgeon: Larey Dresser, MD;  Location: [facility] CATH LAB;  Service: Cardiovascular;  Laterality: N/A;   Patient Active Problem List   Diagnosis Date Noted   Right foot infection 10/22/2017   Cellulitis in diabetic foot ([place]) 10/22/2017   Hyperglycemia 10/22/2017   Lumbar radiculopathy 09/18/2017   Degenerative spondylolisthesis 09/18/2017   Atrial fibrillation ([place]) 02/11/2017   Paroxysmal A-fib ([facility])    Biventricular automatic implantable cardioverter defibrillator in situ    Sepsis ([place]) 04/20/2016   UTI (urinary tract infection) 04/20/2016   Dyspnea 07/19/2015   Interstitial lung disease ([place]) 11/20/2014   SIRS (systemic inflammatory response syndrome) ([place]) 02/03/2014   IDDM (insulin dependent diabetes mellitus) 02/03/2014   Tachycardia 06/14/2013  Chronic systolic CHF (congestive heart failure) ([place]) 09/29/2012   Hypoxia 03/06/2012   Hypertensive heart disease 03/06/2012   Nonischemic cardiomyopathy ([place]) 03/06/2012   Tobacco abuse 03/06/2012   Type 2 diabetes mellitus ([place]) 03/06/2012   Hypertension 03/06/2012   CAD (coronary artery disease), native coronary artery 03/06/2012   Hypokalemia 03/06/2012   Acute bronchitis 02/01/2009   Sleep apnea 02/01/2009   Chest pain 02/01/2009    REFERRING DIAG: Risks for falls  THERAPY DIAG:    1.   Chronic  bilateral low back pain with left-sided sciatica M54.42 ...  Change Dx     2.   Muscle weakness (generalized) M62.81  Change Dx     3.   Difficulty in walking, not elsewhere classified R26.2  Change Dx     4.   Decreased ROM of lumbar spine M53.86  Change Dx     5.   Risk for falls             PERTINENT HISTORY: Bilat THR, and R TKR, obesity, See Snapshot  PRECAUTIONS: None  SUBJECTIVE: I'm feeling better. Pain is mostly in my R low back and hip.  PAIN:  Are you having pain? Yes VAS scale: 5/10 Pain location: Low back and hip Pain orientation: Right PAIN TYPE: aching Pain description: intermittent  Aggravating factors: Standing and walking Relieving factors: Sitting and lying down  OBJECTIVE:    DIAGNOSTIC FINDINGS: none     COGNITION:          Overall cognitive status: Within functional limits for tasks assessed                        SENSATION:          Light touch: Deficits decrease for lower legs and feet- stocking pattern     POSTURE:  Forward head, decreased thoracic kyphosis, increased lumbar lordosis, anterior pelvic tilt   LE AROM/PROM:   Trunk AROM: Markedly decreased with all motions due to pain   LE MMT:   MMT Right 01/06/2021 Left 01/06/2021  Hip flexion 3 2  Hip extension 3 2+  Hip abduction 3 2+  Hip adduction 3 3  Hip internal rotation 3 2+  Hip external rotation 3 2+  Knee flexion 3 3  Knee extension 3+ 2+  Ankle dorsiflexion      Ankle plantarflexion      Ankle inversion      Ankle eversion       (Blank rows = not tested)     FUNCTIONAL TESTS:  Pt was not able to SLS L or R for greater than 1 sec   GAIT: Distance walked: 100 ft Assistive device utilized: None, needs SPC or RW Level of assistance: Modified independence Comments: Decreased pace and step length. Antalgic pattern over the L LE. 2WMT on 01/10/21: 116ft with decreased pace and  step length    [facility] Adult PT Treatment:                                                 DATE: 02/12/21 Therapeutic Exercise: - Seated forward rollouts c physioball x2 forward and laterally L and R - Supine PPT 2x10, 3" - supine marching 2x10 - HL clamshell 2x10,3", GTB - HL hip add sets c ball x15, 5" - Seated LAQ c DF, x10,3" - Seated LAQ,  2x10, 4# - Seated hip side step, 2x10, 4# - Marching, 2x20, 4#  Manual Therapy: MH to the low back and R hip x10 mins  Self Care: Updated HEP    [facility] Adult PT Treatment:                                                DATE: 02/07/21 Therapeutic Exercise: - Seated forward rollouts c physioball x2 forward and laterally L and R - Supine PPT 1x15, 3" - supine marching 1x15 - HL clamshell 1x15,3", GTB - HL hip add sets c ball x15, 5" - piriformis stretch x2, 20" - Bridging x15 - HL clams, GTB, 15x - Figure four, x2, 20" - Seated LAQ c DF, x10,3" - Seated LAQ, 2x10, 4#  Modalities: MH to the low back and R hip x15 mins      [facility] Adult PT Treatment:     Date: 01/31/21 Therapeutic Exercise: - Seated forward rollouts c physioball x2 forward and laterally L and R - Supine PPT 2x10, 3" - supine marching 2x10 - HL clamshell 2x10,3", RTB - HL hip add sets c ball x15, 5"  Manual : Manual traction for the low back with traction both LE supine with LE on physioball and to the R LE c the L LE in HL. 20 sec on, 10 sec off for 10 mins   Self Care:  Reinterated with pt with her level of LE and core weakness, her weight, and degenerative changes in her low back, it will take time and consistent completion of her HEP to see improvement.   01/07/21: - Supine PPT 2x10, 3" - supine marching 2x10 - HL clamshell 2x10,3", RTB - HL hip add sets c ball x15, 5" - Seated forward rollouts c physioball x2 forward and laterally L and R - LTR, 5x, 5" -  supine bridging x10, 3" due to LBP        PATIENT EDUCATION:  Education details: Reviewed and completed HEP and updated for PPT and LTR.  Person educated: Patient Education method:  Explanation, Demonstration, Tactile cues, Verbal cues, and Handouts Education comprehension: verbalized understanding, returned demonstration, verbal cues required, and tactile cues required     HOME EXERCISE PROGRAM: Access Code: [email or web address] URL: [email or web address] Date: 02/12/2021 Prepared by: Gar Ponto  Exercises Supine Posterior Pelvic Tilt - 2 x daily - 7 x weekly - 1 sets - 10 reps - 3 hold Supine March - 2 x daily - 7 x weekly - 1 sets - 10 reps Hooklying Clamshell with Resistance - 2 x daily - 7 x weekly - 1 sets - 10 reps - 3 hold Supine Hip Adduction Isometric with Ball - 2 x daily - 7 x weekly - 1 sets - 10 reps - 5 hold Supine Bridge - 2 x daily - 7 x weekly - 1 sets - 10 reps - 3 hold Seated Flexion Stretch with Swiss Ball - 2 x daily - 7 x weekly - 1 sets - 10 reps - 5 hold Supine Lower Trunk Rotation - 2 x daily - 7 x weekly - 1 sets - 5 reps - 5 hold Hooklying Single Knee to Chest Stretch - 2 x daily - 7 x weekly - 1 sets - 3 reps - 10 hold Supine Piriformis Stretch with Foot on Ground - 2 x daily - 7 x weekly -  1 sets - 3 reps - 20 hold Supine Figure 4 Piriformis Stretch - 2 x daily - 7 x weekly - 3 sets - 3 reps - 20 hold         ASSESSMENT:   CLINICAL IMPRESSION: Pt was completed for lumbopelvic/LE strengthening. Flexibility and strengthening therex were both progressed. PT tolerated PT session s adverse effects. Pt's HEP was progressed. Pt demonstrates better quality of gait and bridging with better stability and control. Pt will continue to benefit from skilled PT to address deficits for optimized function. MH was provided to the R hip and low back for symptom management after therex.    REHAB POTENTIAL: Fair    CLINICAL DECISION MAKING: Evolving/moderate complexity   EVALUATION COMPLEXITY: Moderate     GOALS:     SHORT TERM GOALS:   STG Name Target Date Goal status  1 Pt will be Ind in an initial HEP Baseline: started on eval  01/26/21 INITIAL  2 Pt will voice understanding of measures to assist in the decrease and management of pain Baseline:  01/26/21 INITIAL    LONG TERM GOALS:    LTG Name Target Date Goal status  1 Pt will be Ind in a final HEP to maintain achieved LOF Baseline:       03/09/21   INITIAL  2 Pt trunk ROM will improve to only moderate limitation as indication of decreased pain Baseline: Marked limitation 03/09/21 INITIAL  3 Pt's will report improved to LBP and L LE pain to 5/10 or less with daily activities Baseline:8/10 03/09/21 INITIAL  4 Pt will demonstrate improved L LE strength to 3/5 or greater and R LE strength to 4/5 or greater for improved functional mobility Baseline: see flowsheets 03/09/21 INITIAL  5 Pt's walking tolerance will improve to 316ft s rest c or s and AD Baseline: 03/09/21 INITIAL  6 Pt's 2MWT will increase to 200+ ft s AD as indication of improve strength, balance, and pain. Assess Berg and set goal Baseline: 2MWT 117ft 03/09/21 INITIAL    PLAN: PT FREQUENCY: 2x/week   PT DURATION: 8 weeks   PLANNED INTERVENTIONS: Therapeutic exercises, Therapeutic activity, Neuro Muscular re-education, Balance training, Gait training, Patient/Family education, Joint mobilization, Stair training, Dry Needling, Electrical stimulation, Spinal mobilization, Cryotherapy, Moist heat, Taping, Traction, Ultrasound, Ionotophoresis 4mg /ml Dexamethasone, and Manual therapy   PLAN FOR NEXT SESSION: Progress therex as indicated, assess Berg and set goals. Assess goals.     Avontae Burkhead MS, PT 02/12/21 1:18 PM

## 2021-02-14 ENCOUNTER — Other Ambulatory Visit (HOSPITAL_COMMUNITY): Payer: Self-pay | Admitting: Cardiology

## 2021-02-14 ENCOUNTER — Ambulatory Visit: Payer: Medicare Other

## 2021-02-19 ENCOUNTER — Other Ambulatory Visit (HOSPITAL_COMMUNITY): Payer: Self-pay | Admitting: Family Medicine

## 2021-02-19 ENCOUNTER — Ambulatory Visit: Payer: Medicare Other

## 2021-02-19 NOTE — Therapy (Incomplete)
OUTPATIENT PHYSICAL THERAPY TREATMENT NOTE   Patient Name: [name] MRN: [id number] DOB:12-Jan-1951, 71 y.o., female Today's Date: 02/19/2021  PCP: Sandi Mariscal, MD REFERRING PROVIDER: Jeanella Anton, NP       Past Medical History:  Diagnosis Date   Anemia    a. Noted on 07/2012 labs, instructed to f/u PCP.   Arthritis    "joints" (11/18/2012)   CAD (coronary artery disease), native coronary artery    a. Nonobstructive by cath 02/2012 (done because of low EF).   Chronic bronchitis ([place])    "~ every other year" (11/18/2012)   Chronic combined systolic and diastolic CHF (congestive heart failure) ([place])    a. 03/05/12 echo:  LVEF 20-25%, moderate LVH , inferior and basal to mid septal akinesis, anterior moderate to severe hypokinesis and grade 2 diastolic dysfunction. b. EF 07/2012: EF still 25% (unclear medication compliance).   Chronic lower back pain    Headache(784.0)    "often; maybe not daily" (11/18/2012)   High cholesterol    History of noncompliance with medical treatment    Hypertension    LBBB (left bundle branch block)    Orthopnea    Tobacco abuse    Type II diabetes mellitus ([place])    Past Surgical History:  Procedure Laterality Date   BI-VENTRICULAR IMPLANTABLE CARDIOVERTER DEFIBRILLATOR N/A 11/18/2012   Procedure: BI-VENTRICULAR IMPLANTABLE CARDIOVERTER DEFIBRILLATOR  (CRT-D);  Surgeon: Evans Lance, MD;  Location: [facility] CATH LAB;  Service: Cardiovascular;  Laterality: N/A;   BI-VENTRICULAR IMPLANTABLE CARDIOVERTER DEFIBRILLATOR  (CRT-D)  11/18/2012   BIV ICD GENERATOR CHANGEOUT N/A 11/22/2020   Procedure: BIV ICD GENERATOR CHANGEOUT;  Surgeon: Evans Lance, MD;  Location: [facility] CV LAB;  Service: Cardiovascular;  Laterality: N/A;   CARDIAC CATHETERIZATION  03/04/12   nonobstructive CAD, elevated LVEDP and tortuous vessels suggestive of long-standing hypertension   COLONOSCOPY WITH PROPOFOL N/A 12/12/2016   Procedure: COLONOSCOPY WITH PROPOFOL;   Surgeon: Carol Ada, MD;  Location: [facility] ENDOSCOPY;  Service: Endoscopy;  Laterality: N/A;   JOINT REPLACEMENT     Bilateral hip and right knee   LEFT HEART CATH N/A 03/05/2012   Procedure: LEFT HEART CATH;  Surgeon: Larey Dresser, MD;  Location: [facility] CATH LAB;  Service: Cardiovascular;  Laterality: N/A;   Patient Active Problem List   Diagnosis Date Noted   Right foot infection 10/22/2017   Cellulitis in diabetic foot ([place]) 10/22/2017   Hyperglycemia 10/22/2017   Lumbar radiculopathy 09/18/2017   Degenerative spondylolisthesis 09/18/2017   Atrial fibrillation ([place]) 02/11/2017   Paroxysmal A-fib ([facility])    Biventricular automatic implantable cardioverter defibrillator in situ    Sepsis ([place]) 04/20/2016   UTI (urinary tract infection) 04/20/2016   Dyspnea 07/19/2015   Interstitial lung disease ([place]) 11/20/2014   SIRS (systemic inflammatory response syndrome) ([place]) 02/03/2014   IDDM (insulin dependent diabetes mellitus) 02/03/2014   Tachycardia 35/70/1779   Chronic systolic CHF (congestive heart failure) ([place]) 09/29/2012   Hypoxia 03/06/2012   Hypertensive heart disease 03/06/2012   Nonischemic cardiomyopathy ([place]) 03/06/2012   Tobacco abuse 03/06/2012   Type 2 diabetes mellitus ([place]) 03/06/2012   Hypertension 03/06/2012   CAD (coronary artery disease), native coronary artery 03/06/2012   Hypokalemia 03/06/2012   Acute bronchitis 02/01/2009   Sleep apnea 02/01/2009   Chest pain 02/01/2009    REFERRING DIAG: Risks for falls  THERAPY DIAG:   Chronic bilateral low back pain with left-sided sciatica  Muscle weakness (generalized)  Difficulty in walking, not elsewhere classified  Decreased  ROM of lumbar spine  Risk for falls        PERTINENT HISTORY: Bilat THR, and R TKR, obesity, See Snapshot  PRECAUTIONS: None  SUBJECTIVE: I'm feeling better. Pain is mostly in my R low back and hip.  PAIN:  Are you having pain? Yes VAS scale: 5/10 Pain location: Low back and  hip Pain orientation: Right PAIN TYPE: aching Pain description: intermittent  Aggravating factors: Standing and walking Relieving factors: Sitting and lying down  OBJECTIVE:    DIAGNOSTIC FINDINGS: none     COGNITION:          Overall cognitive status: Within functional limits for tasks assessed                        SENSATION:          Light touch: Deficits decrease for lower legs and feet- stocking pattern     POSTURE:  Forward head, decreased thoracic kyphosis, increased lumbar lordosis, anterior pelvic tilt   LE AROM/PROM:   Trunk AROM: Markedly decreased with all motions due to pain   LE MMT:   MMT Right 01/06/2021 Left 01/06/2021  Hip flexion 3 2  Hip extension 3 2+  Hip abduction 3 2+  Hip adduction 3 3  Hip internal rotation 3 2+  Hip external rotation 3 2+  Knee flexion 3 3  Knee extension 3+ 2+  Ankle dorsiflexion      Ankle plantarflexion      Ankle inversion      Ankle eversion       (Blank rows = not tested)     FUNCTIONAL TESTS:  Pt was not able to SLS L or R for greater than 1 sec   GAIT: Distance walked: 100 ft Assistive device utilized: None, needs SPC or RW Level of assistance: Modified independence Comments: Decreased pace and step length. Antalgic pattern over the L LE. 2WMT on 01/10/21: 156ft with decreased pace and  step length   [facility] Adult PT Treatment:                                                DATE: 02/19/21  Therapeutic Exercise: Therapeutic Exercise: - Seated forward rollouts c physioball x2 forward and laterally L and R - Supine PPT 2x10, 3" - supine marching 2x10 - HL clamshell 2x10,3", GTB - HL hip add sets c ball x15, 5" - Seated LAQ c DF, x10,3" - Seated LAQ, 2x10, 4# - Seated hip side step, 2x10, 4#  - Marching, 2x20, 4# Manual Therapy: *** Neuromuscular re-ed: *** Therapeutic Activity: 2MWT BERG BALANCE Sitting to Standing: {Numbers; 0-4:31231:::1}  4. Stands without using hands and stabilize  independently  3. Stands independently using hands  2. Stands using hands after multiple trials  1. Min A to stand  0. Mod-Max A to stand Standing unsupported: {Numbers; 0-4:31231:::1}  4. Stands safely for 2 minutes  3. Stands 2 minutes with supervision  2. Stands 30 seconds unsupported  1. Needs several tries to stand unsupported for 30 seconds  0. Unable to stand unsupported for 30 seconds Sitting unsupported: {Numbers; 0-4:31231:::1}  4. Sits for 2 minutes independently  3. Sits for 2 minutes with supervision  2. Able to sit 30 seconds  1. Able to sit 10 seconds  0. Unable to sit for 10 seconds Standing to  Sitting: {Numbers; 0-4:31231:::1} 4. Sits safely with minimal use of hands 3. Controls descent with hands 2. Uses back of legs against chair to control descent 1. Sits independently, but uncontrolled descent 0. Needs assistance Transfers: {Numbers; 0-4:31231:::1}  4. Transfers safely with minor use of hands  3. Transfers safely definite use of hands  2. Transfers with verbal cueing/supervision  1. Needs 1 person assist  0. Needs 2 person assist  Standing with eyes closed: {Numbers; 0-4:31231:::1}  4. Stands safely for 10 seconds  3. Stands 10 seconds with supervision   2. Able to stand for 3 seconds  1. Unable to keep eyes closed for 3 seconds, but is safe  0. Needs assist to keep from falling Standing with feet together: {Numbers; 0-4:31231:::1} 4. Stands for 1 minute safely 3. Stands for 1 minute with supervision 2. Unable to hold for 30 seconds  1. Needs help to attain position but can hold for 15 seconds  0. Needs help to attain position and unable to hold for 15 seconds Reaching forward with outstretched arm: {Numbers; 0-4:31231:::1}  4. Reaches forward 10 inches  3. Reaches forward 5 inches  2. Reaches forward 2 inches  1. Reaches forward with supervision  0. Loses balance/requires assistace Retrieving object from the floor: {Numbers; 0-4:31231:::1} 4. Able  to pick up easily and safely 3. Able to pick up with supervision 2. Unable to pick up, but reaches within 1-2 inches independently 1. Unable to pick up and needs supervision 0. Unable/needs assistance to keep from falling  Turning to look behind: {Numbers; 0-4:31231:::1}  4. Looks behind from both sides and weight shifts well  3. Looks behind one side only, other side less weight shift  2. Turns sideways only, maintains balance  1. Needs supervision when turning  0. Needs assistance  Turning 360 degrees: {Numbers; 0-4:31231:::1}  4. Able to turn in </=4 seconds  3. Able to turn on one side in </= 4 seconds   2. Able to turn slowly, but safely  1. Needs supervision or verbal cueing  0. Needs assistance Place alternate foot on stool: {Numbers; 0-4:31231:::1} 4. Completes 8 steps in 20 seconds 3. Completes 8 steps in >20 seconds 2. 4 steps without assistance/supervision 1. Completes >2 steps with minimal assist 0. Unable, needs assist to keep from falling Standing with one foot in front: {Numbers; 0-4:31231:::1}  4. Independent tandem for 30 seconds  3. Independent foot ahead for 30 seconds  2. Independent small step for 30 seconds  1. Needs help to step, but can hold for 15 seconds  0. Loses balance while standing/stepping Standing on one foot: {Numbers; 0-4:31231:::1} 4. Holds >10 seconds 3. Holds 5-10 seconds 2. Holds >/=3 seconds  1. Holds <3 seconds 0. Unable   Total Score: ***/56  Modalities: *** Self Care: ***     [facility] Adult PT Treatment:                                                DATE: 02/12/21 Therapeutic Exercise: - Seated forward rollouts c physioball x2 forward and laterally L and R - Supine PPT 2x10, 3" - supine marching 2x10 - HL clamshell 2x10,3", GTB - HL hip add sets c ball x15, 5" - Seated LAQ c DF, x10,3" - Seated LAQ, 2x10, 4# - Seated hip side step, 2x10, 4# - Marching, 2x20, 4#  Manual  Therapy: MH to the low back and R hip x10  mins  Self Care: Updated HEP    [facility] Adult PT Treatment:                                                DATE: 02/07/21 Therapeutic Exercise: - Seated forward rollouts c physioball x2 forward and laterally L and R - Supine PPT 1x15, 3" - supine marching 1x15 - HL clamshell 1x15,3", GTB - HL hip add sets c ball x15, 5" - piriformis stretch x2, 20" - Bridging x15 - HL clams, GTB, 15x - Figure four, x2, 20" - Seated LAQ c DF, x10,3" - Seated LAQ, 2x10, 4#  Modalities: MH to the low back and R hip x15 mins      [facility] Adult PT Treatment:     Date: 01/31/21 Therapeutic Exercise: - Seated forward rollouts c physioball x2 forward and laterally L and R - Supine PPT 2x10, 3" - supine marching 2x10 - HL clamshell 2x10,3", RTB - HL hip add sets c ball x15, 5"  Manual : Manual traction for the low back with traction both LE supine with LE on physioball and to the R LE c the L LE in HL. 20 sec on, 10 sec off for 10 mins   Self Care:  Reinterated with pt with her level of LE and core weakness, her weight, and degenerative changes in her low back, it will take time and consistent completion of her HEP to see improvement.     PATIENT EDUCATION:  Education details: Reviewed and completed HEP and updated for PPT and LTR.  Person educated: Patient Education method: Explanation, Demonstration, Tactile cues, Verbal cues, and Handouts Education comprehension: verbalized understanding, returned demonstration, verbal cues required, and tactile cues required     HOME EXERCISE PROGRAM: Access Code: [email or web address] URL: [email or web address] Date: 02/12/2021 Prepared by: Gar Ponto  Exercises Supine Posterior Pelvic Tilt - 2 x daily - 7 x weekly - 1 sets - 10 reps - 3 hold Supine March - 2 x daily - 7 x weekly - 1 sets - 10 reps Hooklying Clamshell with Resistance - 2 x daily - 7 x weekly - 1 sets - 10 reps - 3 hold Supine Hip Adduction Isometric with Ball - 2 x daily - 7 x weekly -  1 sets - 10 reps - 5 hold Supine Bridge - 2 x daily - 7 x weekly - 1 sets - 10 reps - 3 hold Seated Flexion Stretch with Swiss Ball - 2 x daily - 7 x weekly - 1 sets - 10 reps - 5 hold Supine Lower Trunk Rotation - 2 x daily - 7 x weekly - 1 sets - 5 reps - 5 hold Hooklying Single Knee to Chest Stretch - 2 x daily - 7 x weekly - 1 sets - 3 reps - 10 hold Supine Piriformis Stretch with Foot on Ground - 2 x daily - 7 x weekly - 1 sets - 3 reps - 20 hold Supine Figure 4 Piriformis Stretch - 2 x daily - 7 x weekly - 3 sets - 3 reps - 20 hold         ASSESSMENT:   CLINICAL IMPRESSION: Pt was completed for lumbopelvic/LE strengthening. Flexibility and strengthening therex were both progressed. PT tolerated PT session s adverse effects. Pt's HEP was progressed. Pt demonstrates  better quality of gait and bridging with better stability and control. Pt will continue to benefit from skilled PT to address deficits for optimized function. MH was provided to the R hip and low back for symptom management after therex.    REHAB POTENTIAL: Fair    CLINICAL DECISION MAKING: Evolving/moderate complexity   EVALUATION COMPLEXITY: Moderate     GOALS:     SHORT TERM GOALS:   STG Name Target Date Goal status  1 Pt will be Ind in an initial HEP Baseline: started on eval 01/26/21 INITIAL  2 Pt will voice understanding of measures to assist in the decrease and management of pain Baseline:  01/26/21 INITIAL    LONG TERM GOALS:    LTG Name Target Date Goal status  1 Pt will be Ind in a final HEP to maintain achieved LOF Baseline:       03/09/21   INITIAL  2 Pt trunk ROM will improve to only moderate limitation as indication of decreased pain Baseline: Marked limitation 03/09/21 INITIAL  3 Pt's will report improved to LBP and L LE pain to 5/10 or less with daily activities Baseline:8/10 03/09/21 INITIAL  4 Pt will demonstrate improved L LE strength to 3/5 or greater and R LE strength to 4/5 or greater  for improved functional mobility Baseline: see flowsheets 03/09/21 INITIAL  5 Pt's walking tolerance will improve to 348ft s rest c or s and AD Baseline: 03/09/21 INITIAL  6 Pt's 2MWT will increase to 200+ ft s AD as indication of improve strength, balance, and pain. Assess Berg and set goal Baseline: 2MWT 160ft 03/09/21 INITIAL     PLAN: PT FREQUENCY: 2x/week   PT DURATION: 8 weeks   PLANNED INTERVENTIONS: Therapeutic exercises, Therapeutic activity, Neuro Muscular re-education, Balance training, Gait training, Patient/Family education, Joint mobilization, Stair training, Dry Needling, Electrical stimulation, Spinal mobilization, Cryotherapy, Moist heat, Taping, Traction, Ultrasound, Ionotophoresis 4mg /ml Dexamethasone, and Manual therapy   PLAN FOR NEXT SESSION: Progress therex as indicated, assess Berg and set goals. Assess goals.     Adamarie Izzo MS, PT 02/19/21 5:47 AM

## 2021-02-21 ENCOUNTER — Ambulatory Visit (INDEPENDENT_AMBULATORY_CARE_PROVIDER_SITE_OTHER): Payer: Medicare Other

## 2021-02-21 ENCOUNTER — Ambulatory Visit: Payer: Medicare Other

## 2021-02-21 DIAGNOSIS — I428 Other cardiomyopathies: Secondary | ICD-10-CM

## 2021-02-22 ENCOUNTER — Telehealth: Payer: Self-pay

## 2021-02-22 LAB — CUP PACEART REMOTE DEVICE CHECK
Battery Remaining Longevity: 101 mo
Battery Voltage: 3.02 V
Brady Statistic AP VP Percent: 0.01 %
Brady Statistic AP VS Percent: 0 %
Brady Statistic AS VP Percent: 98.54 %
Brady Statistic AS VS Percent: 1.44 %
Brady Statistic RA Percent Paced: 0.02 %
Brady Statistic RV Percent Paced: 29.87 %
Date Time Interrogation Session: 20230127111742
HighPow Impedance: 59 Ohm
Implantable Lead Implant Date: 20141023
Implantable Lead Implant Date: 20141023
Implantable Lead Implant Date: 20141023
Implantable Lead Location: 753858
Implantable Lead Location: 753859
Implantable Lead Location: 753860
Implantable Lead Model: 4298
Implantable Lead Model: 5076
Implantable Lead Model: 6935
Implantable Pulse Generator Implant Date: 20221027
Lead Channel Impedance Value: 152 Ohm
Lead Channel Impedance Value: 152 Ohm
Lead Channel Impedance Value: 152 Ohm
Lead Channel Impedance Value: 165.029
Lead Channel Impedance Value: 165.029
Lead Channel Impedance Value: 304 Ohm
Lead Channel Impedance Value: 304 Ohm
Lead Channel Impedance Value: 304 Ohm
Lead Channel Impedance Value: 361 Ohm
Lead Channel Impedance Value: 399 Ohm
Lead Channel Impedance Value: 418 Ohm
Lead Channel Impedance Value: 418 Ohm
Lead Channel Impedance Value: 513 Ohm
Lead Channel Impedance Value: 532 Ohm
Lead Channel Impedance Value: 532 Ohm
Lead Channel Impedance Value: 532 Ohm
Lead Channel Impedance Value: 551 Ohm
Lead Channel Impedance Value: 551 Ohm
Lead Channel Pacing Threshold Amplitude: 0.5 V
Lead Channel Pacing Threshold Amplitude: 0.5 V
Lead Channel Pacing Threshold Amplitude: 1.25 V
Lead Channel Pacing Threshold Pulse Width: 0.4 ms
Lead Channel Pacing Threshold Pulse Width: 0.4 ms
Lead Channel Pacing Threshold Pulse Width: 0.4 ms
Lead Channel Sensing Intrinsic Amplitude: 14.625 mV
Lead Channel Sensing Intrinsic Amplitude: 14.625 mV
Lead Channel Sensing Intrinsic Amplitude: 2.625 mV
Lead Channel Sensing Intrinsic Amplitude: 2.625 mV
Lead Channel Setting Pacing Amplitude: 1.5 V
Lead Channel Setting Pacing Amplitude: 2 V
Lead Channel Setting Pacing Amplitude: 2.5 V
Lead Channel Setting Pacing Pulse Width: 0.4 ms
Lead Channel Setting Pacing Pulse Width: 0.4 ms
Lead Channel Setting Sensing Sensitivity: 0.45 mV

## 2021-02-22 NOTE — Telephone Encounter (Signed)
Spoke with pt re; no show appt. Pt states she called to cancel. Advised of attendance policy and upcoming appt.

## 2021-02-25 ENCOUNTER — Other Ambulatory Visit (HOSPITAL_COMMUNITY): Payer: Self-pay

## 2021-02-26 ENCOUNTER — Ambulatory Visit: Payer: Medicare Other

## 2021-02-26 ENCOUNTER — Other Ambulatory Visit: Payer: Self-pay

## 2021-02-26 DIAGNOSIS — G8929 Other chronic pain: Secondary | ICD-10-CM

## 2021-02-26 DIAGNOSIS — R262 Difficulty in walking, not elsewhere classified: Secondary | ICD-10-CM

## 2021-02-26 DIAGNOSIS — Z9181 History of falling: Secondary | ICD-10-CM

## 2021-02-26 DIAGNOSIS — M6281 Muscle weakness (generalized): Secondary | ICD-10-CM

## 2021-02-26 DIAGNOSIS — M5442 Lumbago with sciatica, left side: Secondary | ICD-10-CM | POA: Diagnosis not present

## 2021-02-26 DIAGNOSIS — M5386 Other specified dorsopathies, lumbar region: Secondary | ICD-10-CM

## 2021-02-26 NOTE — Therapy (Signed)
OUTPATIENT PHYSICAL THERAPY TREATMENT NOTE   Patient Name: [name] MRN: [id number] DOB:20-Nov-1950, 71 y.o., female Today's Date: 02/26/2021  PCP: Sandi Mariscal, MD REFERRING PROVIDER: Sandi Mariscal, MD   PT End of Session - 02/26/21 0859     Visit Number 7    Number of Visits 17    Date for PT Re-Evaluation 03/09/21    Authorization Type [employer] MEDICARE    Authorization Time Period Reassess FOTO on 6th and 11th visits    Progress Note Due on Visit 10    PT Start Time 0856    PT Stop Time 0935    PT Time Calculation (min) 39 min    Activity Tolerance Patient limited by pain    Behavior During Therapy [facility] for tasks assessed/performed                Past Medical History:  Diagnosis Date   Anemia    a. Noted on 07/2012 labs, instructed to f/u PCP.   Arthritis    "joints" (11/18/2012)   CAD (coronary artery disease), native coronary artery    a. Nonobstructive by cath 02/2012 (done because of low EF).   Chronic bronchitis ([place])    "~ every other year" (11/18/2012)   Chronic combined systolic and diastolic CHF (congestive heart failure) ([place])    a. 03/05/12 echo:  LVEF 20-25%, moderate LVH , inferior and basal to mid septal akinesis, anterior moderate to severe hypokinesis and grade 2 diastolic dysfunction. b. EF 07/2012: EF still 25% (unclear medication compliance).   Chronic lower back pain    Headache(784.0)    "often; maybe not daily" (11/18/2012)   High cholesterol    History of noncompliance with medical treatment    Hypertension    LBBB (left bundle branch block)    Orthopnea    Tobacco abuse    Type II diabetes mellitus ([place])    Past Surgical History:  Procedure Laterality Date   BI-VENTRICULAR IMPLANTABLE CARDIOVERTER DEFIBRILLATOR N/A 11/18/2012   Procedure: BI-VENTRICULAR IMPLANTABLE CARDIOVERTER DEFIBRILLATOR  (CRT-D);  Surgeon: Evans Lance, MD;  Location: [facility] CATH LAB;  Service: Cardiovascular;  Laterality: N/A;   BI-VENTRICULAR IMPLANTABLE CARDIOVERTER  DEFIBRILLATOR  (CRT-D)  11/18/2012   BIV ICD GENERATOR CHANGEOUT N/A 11/22/2020   Procedure: BIV ICD GENERATOR CHANGEOUT;  Surgeon: Evans Lance, MD;  Location: [facility] CV LAB;  Service: Cardiovascular;  Laterality: N/A;   CARDIAC CATHETERIZATION  03/04/12   nonobstructive CAD, elevated LVEDP and tortuous vessels suggestive of long-standing hypertension   COLONOSCOPY WITH PROPOFOL N/A 12/12/2016   Procedure: COLONOSCOPY WITH PROPOFOL;  Surgeon: Carol Ada, MD;  Location: [facility] ENDOSCOPY;  Service: Endoscopy;  Laterality: N/A;   JOINT REPLACEMENT     Bilateral hip and right knee   LEFT HEART CATH N/A 03/05/2012   Procedure: LEFT HEART CATH;  Surgeon: Larey Dresser, MD;  Location: [facility] C[facility] LAB;  Service: Cardiovascular;  Laterality: N/A;   Patient Active Problem List   Diagnosis Date Noted   Right foot infection 10/22/2017   Cellulitis in diabetic foot ([place]) 10/22/2017   Hyperglycemia 10/22/2017   Lumbar radiculopathy 09/18/2017   Degenerative spondylolisthesis 09/18/2017   Atrial fibrillation ([place]) 02/11/2017   Paroxysmal A-fib ([facility])    Biventricular automatic implantable cardioverter defibrillator in situ    Sepsis ([place]) 04/20/2016   UTI (urinary tract infection) 04/20/2016   Dyspnea 07/19/2015   Interstitial lung disease ([place]) 11/20/2014   SIRS (systemic inflammatory response syndrome) ([place]) 02/03/2014   IDDM (insulin dependent diabetes mellitus)  02/03/2014   Tachycardia 27/03/5007   Chronic systolic CHF (congestive heart failure) ([place]) 09/29/2012   Hypoxia 03/06/2012   Hypertensive heart disease 03/06/2012   Nonischemic cardiomyopathy ([place]) 03/06/2012   Tobacco abuse 03/06/2012   Type 2 diabetes mellitus ([place]) 03/06/2012   Hypertension 03/06/2012   CAD (coronary artery disease), native coronary artery 03/06/2012   Hypokalemia 03/06/2012   Acute bronchitis 02/01/2009   Sleep apnea 02/01/2009   Chest pain 02/01/2009    REFERRING DIAG: Risks for falls  THERAPY DIAG:    Chronic bilateral low back pain with left-sided sciatica  Muscle weakness (generalized)  Difficulty in walking, not elsewhere classified  Decreased ROM of lumbar spine  Risk for falls        PERTINENT HISTORY: Bilat THR, and R TKR, obesity, See Snapshot  PRECAUTIONS: None  SUBJECTIVE: I haven't had any back pain for several days, and overall it has been felling better. Pt notes she has been having L knee pain PAIN:  Are you having pain? Yes VAS scale: 0/10 Pain location: Low back and hip Pain orientation: Right PAIN TYPE: aching Pain description: intermittent  Aggravating factors: Standing and walking Relieving factors: Sitting and lying down  OBJECTIVE:    DIAGNOSTIC FINDINGS: none     COGNITION:          Overall cognitive status: Within functional limits for tasks assessed                        SENSATION:          Light touch: Deficits decrease for lower legs and feet- stocking pattern     POSTURE:  Forward head, decreased thoracic kyphosis, increased lumbar lordosis, anterior pelvic tilt   LE AROM/PROM:   Trunk AROM: Markedly decreased with all motions due to pain   LE MMT:   MMT Right 01/06/2021 Left 01/06/2021  Hip flexion 3 2  Hip extension 3 2+  Hip abduction 3 2+  Hip adduction 3 3  Hip internal rotation 3 2+  Hip external rotation 3 2+  Knee flexion 3 3  Knee extension 3+ 2+  Ankle dorsiflexion      Ankle plantarflexion      Ankle inversion      Ankle eversion       (Blank rows = not tested)     FUNCTIONAL TESTS:  Pt was not able to SLS L or R for greater than 1 sec   GAIT: Distance walked: 100 ft Assistive device utilized: None, needs SPC or RW Level of assistance: Modified independence Comments: Decreased pace and step length. Antalgic pattern over the L LE. 2WMT on 01/10/21: 174ft with decreased pace and  step length   [facility] Adult PT Treatment:                                                DATE: 02/26/21  Therapeutic  Exercise: - Nu-step, 5 mins, L5, UE/LE - SLR c quad set, 2x10, L and R - Bridging, 2x10  - Supine PPT 2x10, 3" - supine marching 2x10 - HL clamshell 2x10,3", GTB - HL hip add sets c ball x15, 5" - Supine, hamstring rollouts and back - Seated forward rollouts c physioball x2 forward and        laterally L and R - Seated LAQ c DF, x10,3" - Seated LAQ,  2x10, 4# - Seated hip side step, 2x10, GTB  [facility] Adult PT Treatment:                                                DATE: 02/12/21 Therapeutic Exercise: - Seated forward rollouts c physioball x2 forward and laterally L and R - Supine PPT 2x10, 3" - supine marching 2x10 - HL clamshell 2x10,3", GTB - HL hip add sets c ball x15, 5" - Seated LAQ c DF, x10,3" - Seated LAQ, 2x10, 4# - Seated hip side step, 2x10, 4# - Marching, 2x20, 4#  Manual Therapy: MH to the low back and R hip x10 mins  Self Care: Updated HEP    [facility] Adult PT Treatment:                                                DATE: 02/07/21 Therapeutic Exercise: - Seated forward rollouts c physioball x2 forward and laterally L and R - Supine PPT 1x15, 3" - supine marching 1x15 - HL clamshell 1x15,3", GTB - HL hip add sets c ball x15, 5" - piriformis stretch x2, 20" - Bridging x15 - HL clams, GTB, 15x - Figure four, x2, 20" - Seated LAQ c DF, x10,3" - Seated LAQ, 2x10, 4#  Modalities: MH to the low back and R hip x15 mins     PATIENT EDUCATION:  Education details: Reviewed and completed HEP and updated for PPT and LTR.  Person educated: Patient Education method: Explanation, Demonstration, Tactile cues, Verbal cues, and Handouts Education comprehension: verbalized understanding, returned demonstration, verbal cues required, and tactile cues required     HOME EXERCISE PROGRAM: Access Code: [email or web address] URL: [email or web address] Date: 02/12/2021 Prepared by: Gar Ponto  Exercises Supine Posterior Pelvic Tilt - 2 x daily - 7 x weekly - 1 sets -  10 reps - 3 hold Supine March - 2 x daily - 7 x weekly - 1 sets - 10 reps Hooklying Clamshell with Resistance - 2 x daily - 7 x weekly - 1 sets - 10 reps - 3 hold Supine Hip Adduction Isometric with Ball - 2 x daily - 7 x weekly - 1 sets - 10 reps - 5 hold Supine Bridge - 2 x daily - 7 x weekly - 1 sets - 10 reps - 3 hold Seated Flexion Stretch with Swiss Ball - 2 x daily - 7 x weekly - 1 sets - 10 reps - 5 hold Supine Lower Trunk Rotation - 2 x daily - 7 x weekly - 1 sets - 5 reps - 5 hold Hooklying Single Knee to Chest Stretch - 2 x daily - 7 x weekly - 1 sets - 3 reps - 10 hold Supine Piriformis Stretch with Foot on Ground - 2 x daily - 7 x weekly - 1 sets - 3 reps - 20 hold Supine Figure 4 Piriformis Stretch - 2 x daily - 7 x weekly - 3 sets - 3 reps - 20 hold         ASSESSMENT:   CLINICAL IMPRESSION: PT was completed for core and LE strengthening. LE strengthening focused on the hips and knees. Pt  reports her low back pain has overall been improved, and for the past  several days she has been pain free. Pt reports L knee pain today. Pt's L knee is arthritic c valgus deformity and is not able to extend fully. After the PT session, pt noted that her L knee felt better. Pt tolerated PT today without adverse effects. Pt will continue to benefit from skilled PT to address strength and ROM deficits to optimize functional with less pain.    REHAB POTENTIAL: Fair    CLINICAL DECISION MAKING: Evolving/moderate complexity   EVALUATION COMPLEXITY: Moderate     GOALS:     SHORT TERM GOALS:   STG Name Target Date Goal status  1 Pt will be Ind in an initial HEP Baseline: started on eval 01/26/21 INITIAL  2 Pt will voice understanding of measures to assist in the decrease and management of pain Baseline:  01/26/21 INITIAL    LONG TERM GOALS:    LTG Name Target Date Goal status  1 Pt will be Ind in a final HEP to maintain achieved LOF Baseline:       03/09/21   INITIAL  2 Pt trunk  ROM will improve to only moderate limitation as indication of decreased pain Baseline: Marked limitation 03/09/21 INITIAL  3 Pt's will report improved to LBP and L LE pain to 5/10 or less with daily activities Baseline:8/10 03/09/21 INITIAL  4 Pt will demonstrate improved L LE strength to 3/5 or greater and R LE strength to 4/5 or greater for improved functional mobility Baseline: see flowsheets 03/09/21 INITIAL  5 Pt's walking tolerance will improve to 380ft s rest c or s and AD Baseline: 03/09/21 INITIAL  6 Pt's 2MWT will increase to 200+ ft s AD as indication of improve strength, balance, and pain. Assess Berg and set goal Baseline: 2MWT 150ft 03/09/21 INITIAL     PLAN: PT FREQUENCY: 2x/week   PT DURATION: 8 weeks   PLANNED INTERVENTIONS: Therapeutic exercises, Therapeutic activity, Neuro Muscular re-education, Balance training, Gait training, Patient/Family education, Joint mobilization, Stair training, Dry Needling, Electrical stimulation, Spinal mobilization, Cryotherapy, Moist heat, Taping, Traction, Ultrasound, Ionotophoresis 4mg /ml Dexamethasone, and Manual therapy   PLAN FOR NEXT SESSION: Progress therex as indicated, assess Berg and set goal. Assess short term goals.     Demarko Zeimet MS, PT 02/26/21 10:06 AM

## 2021-02-27 ENCOUNTER — Other Ambulatory Visit (HOSPITAL_COMMUNITY): Payer: Self-pay

## 2021-02-27 NOTE — Therapy (Addendum)
OUTPATIENT PHYSICAL THERAPY TREATMENT NOTE/Discharge   Patient Name: [name] MRN: [id number] DOB:28-Apr-1950, 71 y.o., female Today's Date: 02/28/2021  PCP: Sandi Mariscal, MD REFERRING PROVIDER: Jeanella Anton, NP   PT End of Session - 02/28/21 [id number]     Visit Number 8    Number of Visits 17    Date for PT Re-Evaluation 03/09/21    Authorization Type [employer] MEDICARE    Progress Note Due on Visit 10    PT Start Time 0848    PT Stop Time 0930    PT Time Calculation (min) 42 min    Activity Tolerance Patient tolerated treatment well    Behavior During Therapy [facility] for tasks assessed/performed                 Past Medical History:  Diagnosis Date   Anemia    a. Noted on 07/2012 labs, instructed to f/u PCP.   Arthritis    "joints" (11/18/2012)   CAD (coronary artery disease), native coronary artery    a. Nonobstructive by cath 02/2012 (done because of low EF).   Chronic bronchitis ([place])    "~ every other year" (11/18/2012)   Chronic combined systolic and diastolic CHF (congestive heart failure) ([place])    a. 03/05/12 echo:  LVEF 20-25%, moderate LVH , inferior and basal to mid septal akinesis, anterior moderate to severe hypokinesis and grade 2 diastolic dysfunction. b. EF 07/2012: EF still 25% (unclear medication compliance).   Chronic lower back pain    Headache(784.0)    "often; maybe not daily" (11/18/2012)   High cholesterol    History of noncompliance with medical treatment    Hypertension    LBBB (left bundle branch block)    Orthopnea    Tobacco abuse    Type II diabetes mellitus ([place])    Past Surgical History:  Procedure Laterality Date   BI-VENTRICULAR IMPLANTABLE CARDIOVERTER DEFIBRILLATOR N/A 11/18/2012   Procedure: BI-VENTRICULAR IMPLANTABLE CARDIOVERTER DEFIBRILLATOR  (CRT-D);  Surgeon: Evans Lance, MD;  Location: [facility] CATH LAB;  Service: Cardiovascular;  Laterality: N/A;   BI-VENTRICULAR IMPLANTABLE CARDIOVERTER DEFIBRILLATOR  (CRT-D)  11/18/2012   BIV  ICD GENERATOR CHANGEOUT N/A 11/22/2020   Procedure: BIV ICD GENERATOR CHANGEOUT;  Surgeon: Evans Lance, MD;  Location: [facility] CV LAB;  Service: Cardiovascular;  Laterality: N/A;   CARDIAC CATHETERIZATION  03/04/12   nonobstructive CAD, elevated LVEDP and tortuous vessels suggestive of long-standing hypertension   COLONOSCOPY WITH PROPOFOL N/A 12/12/2016   Procedure: COLONOSCOPY WITH PROPOFOL;  Surgeon: Carol Ada, MD;  Location: [facility] ENDOSCOPY;  Service: Endoscopy;  Laterality: N/A;   JOINT REPLACEMENT     Bilateral hip and right knee   LEFT HEART CATH N/A 03/05/2012   Procedure: LEFT HEART CATH;  Surgeon: Larey Dresser, MD;  Location: [facility] CATH LAB;  Service: Cardiovascular;  Laterality: N/A;   Patient Active Problem List   Diagnosis Date Noted   Right foot infection 10/22/2017   Cellulitis in diabetic foot ([place]) 10/22/2017   Hyperglycemia 10/22/2017   Lumbar radiculopathy 09/18/2017   Degenerative spondylolisthesis 09/18/2017   Atrial fibrillation ([place]) 02/11/2017   Paroxysmal A-fib ([facility])    Biventricular automatic implantable cardioverter defibrillator in situ    Sepsis ([place]) 04/20/2016   UTI (urinary tract infection) 04/20/2016   Dyspnea 07/19/2015   Interstitial lung disease ([place]) 11/20/2014   SIRS (systemic inflammatory response syndrome) ([place]) 02/03/2014   IDDM (insulin dependent diabetes mellitus) 02/03/2014   Tachycardia 64/40/3474   Chronic systolic CHF (congestive  heart failure) ([place]) 09/29/2012   Hypoxia 03/06/2012   Hypertensive heart disease 03/06/2012   Nonischemic cardiomyopathy ([place]) 03/06/2012   Tobacco abuse 03/06/2012   Type 2 diabetes mellitus ([place]) 03/06/2012   Hypertension 03/06/2012   CAD (coronary artery disease), native coronary artery 03/06/2012   Hypokalemia 03/06/2012   Acute bronchitis 02/01/2009   Sleep apnea 02/01/2009   Chest pain 02/01/2009    REFERRING DIAG: Risks for falls  THERAPY DIAG:   Chronic bilateral low back pain with  left-sided sciatica  Muscle weakness (generalized)  Difficulty in walking, not elsewhere classified  Decreased ROM of lumbar spine  Risk for falls        PERTINENT HISTORY: Bilat THR, and R TKR, obesity, See Snapshot  PRECAUTIONS: None  SUBJECTIVE: Pt notes she is feeling good today, not experiencing low back or L knee pain PAIN:  Are you having pain? Yes VAS scale: 0/10 Pain location: Low back and hip Pain orientation: Right PAIN TYPE: aching Pain description: intermittent  Aggravating factors: Standing and walking Relieving factors: Sitting and lying down  OBJECTIVE:    DIAGNOSTIC FINDINGS: none     COGNITION:          Overall cognitive status: Within functional limits for tasks assessed                        SENSATION:          Light touch: Deficits decrease for lower legs and feet- stocking pattern     POSTURE:  Forward head, decreased thoracic kyphosis, increased lumbar lordosis, anterior pelvic tilt   LE AROM/PROM:   Trunk AROM: Markedly decreased with all motions due to pain   LE MMT:   MMT Right 01/06/2021 Left 01/06/2021  Hip flexion 3 2  Hip extension 3 2+  Hip abduction 3 2+  Hip adduction 3 3  Hip internal rotation 3 2+  Hip external rotation 3 2+  Knee flexion 3 3  Knee extension 3+ 2+  Ankle dorsiflexion      Ankle plantarflexion      Ankle inversion      Ankle eversion       (Blank rows = not tested)     FUNCTIONAL TESTS:  Pt was not able to SLS L or R for greater than 1 sec   GAIT: Distance walked: 100 ft Assistive device utilized: None, needs SPC or RW Level of assistance: Modified independence Comments: Decreased pace and step length. Antalgic pattern over the L LE. 2WMT on 01/10/21: 129f with decreased pace and  step length   [facility] Adult PT Treatment:                                                DATE: 02/28/21 Therapeutic Exercise: Nu-step, 5 mins, L5, UE/LE - SLR c quad set, 2x10, L and R - Bridging, 2x10  - Supine  PPT 2x10, 3" - supine marching 2x10 - HL clamshell 2x10,3", GTB - HL hip add sets c ball x15, 5" - Seated forward rollouts c physioball x2 forward and        laterally L and R - Seated LAQ, 2x10, 4#  Therapeutic Activity: BERG BALANCE  Sitting to Standing: Numbers; 0-4: 3  4. Stands without using hands and stabilize independently  3. Stands independently using hands  2. Stands using hands after  multiple trials  1. Min A to stand  0. Mod-Max A to stand Standing unsupported: Numbers; 0-4: 4  4. Stands safely for 2 minutes  3. Stands 2 minutes with supervision  2. Stands 30 seconds unsupported  1. Needs several tries to stand unsupported for 30 seconds  0. Unable to stand unsupported for 30 seconds Sitting unsupported: Numbers; 0-4: 4  4. Sits for 2 minutes independently  3. Sits for 2 minutes with supervision  2. Able to sit 30 seconds  1. Able to sit 10 seconds  0. Unable to sit for 10 seconds Standing to Sitting: Numbers; 0-4: 3 4. Sits safely with minimal use of hands 3. Controls descent with hands 2. Uses back of legs against chair to control descent 1. Sits independently, but uncontrolled descent 0. Needs assistance Transfers: Numbers; 0-4: 3  4. Transfers safely with minor use of hands  3. Transfers safely definite use of hands  2. Transfers with verbal cueing/supervision  1. Needs 1 person assist  0. Needs 2 person assist  Standing with eyes closed: Numbers; 0-4: 4  4. Stands safely for 10 seconds  3. Stands 10 seconds with supervision   2. Able to stand for 3 seconds  1. Unable to keep eyes closed for 3 seconds, but is safe  0. Needs assist to keep from falling Standing with feet together: Numbers; 0-4: 2 4. Stands for 1 minute safely 3. Stands for 1 minute with supervision 2. Unable to hold for 30 seconds  1. Needs help to attain position but can hold for 15 seconds  0. Needs help to attain position and unable to hold for 15 seconds Reaching forward with  outstretched arm: Numbers; 0-4: 4  4. Reaches forward 10 inches  3. Reaches forward 5 inches  2. Reaches forward 2 inches  1. Reaches forward with supervision  0. Loses balance/requires assistace Retrieving object from the floor: Numbers; 0-4: 1 4. Able to pick up easily and safely 3. Able to pick up with supervision 2. Unable to pick up, but reaches within 1-2 inches independently 1. Unable to pick up and needs supervision 0. Unable/needs assistance to keep from falling  Turning to look behind: Numbers; 0-4: 4  4. Looks behind from both sides and weight shifts well  3. Looks behind one side only, other side less weight shift  2. Turns sideways only, maintains balance  1. Needs supervision when turning  0. Needs assistance  Turning 360 degrees: Numbers; 0-4: 3  4. Able to turn in </=4 seconds  3. Able to turn on one side in </= 4 seconds   2. Able to turn slowly, but safely  1. Needs supervision or verbal cueing  0. Needs assistance Place alternate foot on stool: Numbers; 0-4: 2 4. Completes 8 steps in 20 seconds 3. Completes 8 steps in >20 seconds 2. 4 steps without assistance/supervision 1. Completes >2 steps with minimal assist 0. Unable, needs assist to keep from falling Standing with one foot in front: Numbers; 0-4: 2  4. Independent tandem for 30 seconds  3. Independent foot ahead for 30 seconds  2. Independent small step for 30 seconds  1. Needs help to step, but can hold for 15 seconds  0. Loses balance while standing/stepping Standing on one foot: Numbers; 0-4: 1 4. Holds >10 seconds 3. Holds 5-10 seconds 2. Holds >/=3 seconds  1. Holds <3 seconds 0. Unable   Total Score: 40/56   DLS, narrow base, x1 60" Off set tandem stance,  x1, 60", each foot forward  Self Care: Balance activities added to HEP   [facility] Adult PT Treatment:                                                DATE: 02/26/21  Therapeutic Exercise: - Nu-step, 5 mins, L5, UE/LE - SLR c quad set,  2x10, L and R - Bridging, 2x10  - Supine PPT 2x10, 3" - supine marching 2x10 - HL clamshell 2x10,3", GTB - HL hip add sets c ball x15, 5" - Supine, hamstring rollouts and back - Seated forward rollouts c physioball x2 forward and        laterally L and R - Seated LAQ c DF, x10,3" - Seated LAQ, 2x10, 4# - Seated hip side step, 2x10, GTB  [facility] Adult PT Treatment:                                                DATE: 02/12/21 Therapeutic Exercise: - Seated forward rollouts c physioball x2 forward and laterally L and R - Supine PPT 2x10, 3" - supine marching 2x10 - HL clamshell 2x10,3", GTB - HL hip add sets c ball x15, 5" - Seated LAQ c DF, x10,3" - Seated LAQ, 2x10, 4# - Seated hip side step, 2x10, 4# - Marching, 2x20, 4#  Manual Therapy: MH to the low back and R hip x10 mins  Self Care: Updated HEP      PATIENT EDUCATION:  Education details: Reviewed and completed HEP and updated for PPT and LTR.  Person educated: Patient Education method: Explanation, Demonstration, Tactile cues, Verbal cues, and Handouts Education comprehension: verbalized understanding, returned demonstration, verbal cues required, and tactile cues required     HOME EXERCISE PROGRAM: Access Code: [email or web address] URL: [email or web address] Date: 02/28/2021 Prepared by: Gar Ponto  Exercises Supine Posterior Pelvic Tilt - 2 x daily - 7 x weekly - 1 sets - 10 reps - 3 hold Supine March - 2 x daily - 7 x weekly - 1 sets - 10 reps Hooklying Clamshell with Resistance - 2 x daily - 7 x weekly - 1 sets - 10 reps - 3 hold Supine Hip Adduction Isometric with Ball - 2 x daily - 7 x weekly - 1 sets - 10 reps - 5 hold Supine Bridge - 2 x daily - 7 x weekly - 1 sets - 10 reps - 3 hold Romberg Stance - 2 x daily - 7 x weekly - 1 sets - 3 reps - 30 hold Standing Romberg to 1/2 Tandem Stance - 2 x daily - 7 x weekly - 1 sets - 3 reps - 30 hold Seated Flexion Stretch with Swiss Ball - 2 x daily - 7 x weekly  - 1 sets - 10 reps - 5 hold Supine Lower Trunk Rotation - 2 x daily - 7 x weekly - 1 sets - 5 reps - 5 hold Hooklying Single Knee to Chest Stretch - 2 x daily - 7 x weekly - 1 sets - 3 reps - 10 hold Supine Piriformis Stretch with Foot on Ground - 2 x daily - 7 x weekly - 1 sets - 3 reps - 20 hold Supine Figure 4 Piriformis Stretch - 2 x daily -  7 x weekly - 3 sets - 3 reps - 20 hold  ASSESSMENT:   CLINICAL IMPRESSION: PT was completed for standing balance and for core and LE strengthening. Per observation, pt's balance is improved than on the initial eval, but Berg assessment indicates decreased balance esp c narrow base or SL. Pt's HEP was updated for balance activities. Pt is making appropriate progress with improve strength, functional mobility, and decreased pain. Pt will benefit from continued skilled PT to address strength, balance, and pain to improve functional mobility.  REHAB POTENTIAL: Fair    CLINICAL DECISION MAKING: Evolving/moderate complexity   EVALUATION COMPLEXITY: Moderate     GOALS:     SHORT TERM GOALS:   STG Name Target Date Goal status  1 Pt will be Ind in an initial HEP Baseline: started on eval 01/26/21 Met 02/28/21  2 Pt will voice understanding of measures to assist in the decrease and management of pain Baseline:  01/26/21 Met 02/28/21    LONG TERM GOALS:    LTG Name Target Date Goal status  1 Pt will be Ind in a final HEP to maintain achieved LOF Baseline:       03/09/21   INITIAL  2 Pt trunk ROM will improve to only moderate limitation as indication of decreased pain Baseline: Marked limitation 03/09/21 INITIAL  3 Pt's will report improved to LBP and L LE pain to 5/10 or less with daily activities Baseline:8/10 03/09/21 INITIAL  4 Pt will demonstrate improved L LE strength to 3/5 or greater and R LE strength to 4/5 or greater for improved functional mobility Baseline: see flowsheets 03/09/21 INITIAL  5 Pt's walking tolerance will improve to 362f s  rest c or s and AD Baseline: 03/09/21 INITIAL  6 Pt's 2MWT will increase to 200+ ft s AD as indication of improve strength, balance, and pain. Assess Berg and set goal Baseline: 2MWT [id number]/11/23 INITIAL  7 Improve Berg to 45/50 as indication of improved balance and funtional mobility 03/09/21 INITIAL     PLAN: PT FREQUENCY: 2x/week   PT DURATION: 8 weeks   PLANNED INTERVENTIONS: Therapeutic exercises, Therapeutic activity, Neuro Muscular re-education, Balance training, Gait training, Patient/Family education, Joint mobilization, Stair training, Dry Needling, Electrical stimulation, Spinal mobilization, Cryotherapy, Moist heat, Taping, Traction, Ultrasound, Ionotophoresis 32m76ml Dexamethasone, and Manual therapy   PLAN FOR NEXT SESSION: Progress therex as indicated. Discuss walking program.   AllGar Ponto, PT 02/28/21 9:48 AM  PHYSICAL THERAPY DISCHARGE SUMMARY  Visits from Start of Care: 8  Current functional level related to goals / functional outcomes: See above   Remaining deficits: See above   Education / Equipment: HEP   Patient agrees to discharge. Patient goals were partially met. Patient is being discharged due to not returning since the last visit.   Shiquita Collignon MS, PT 03/27/21 8:43 AM

## 2021-02-28 ENCOUNTER — Ambulatory Visit: Payer: Medicare Other | Attending: Nurse Practitioner

## 2021-02-28 ENCOUNTER — Encounter: Payer: Medicare Other | Admitting: Internal Medicine

## 2021-02-28 ENCOUNTER — Other Ambulatory Visit: Payer: Self-pay

## 2021-02-28 DIAGNOSIS — M5386 Other specified dorsopathies, lumbar region: Secondary | ICD-10-CM | POA: Insufficient documentation

## 2021-02-28 DIAGNOSIS — R262 Difficulty in walking, not elsewhere classified: Secondary | ICD-10-CM | POA: Diagnosis present

## 2021-02-28 DIAGNOSIS — G8929 Other chronic pain: Secondary | ICD-10-CM | POA: Insufficient documentation

## 2021-02-28 DIAGNOSIS — Z9181 History of falling: Secondary | ICD-10-CM | POA: Diagnosis present

## 2021-02-28 DIAGNOSIS — M5442 Lumbago with sciatica, left side: Secondary | ICD-10-CM | POA: Diagnosis not present

## 2021-02-28 DIAGNOSIS — M6281 Muscle weakness (generalized): Secondary | ICD-10-CM | POA: Diagnosis present

## 2021-03-04 NOTE — Progress Notes (Signed)
Remote ICD transmission.   

## 2021-03-05 ENCOUNTER — Other Ambulatory Visit (HOSPITAL_COMMUNITY): Payer: Self-pay

## 2021-03-05 ENCOUNTER — Ambulatory Visit: Payer: Medicare Other

## 2021-03-05 NOTE — Therapy (Incomplete)
OUTPATIENT PHYSICAL THERAPY TREATMENT NOTE   Patient Name: [name] MRN: [id number] DOB:Mar 11, 1950, 71 y.o., female Today's Date: 03/05/2021  PCP: Sandi Mariscal, MD REFERRING PROVIDER: Sandi Mariscal, MD         Past Medical History:  Diagnosis Date   Anemia    a. Noted on 07/2012 labs, instructed to f/u PCP.   Arthritis    "joints" (11/18/2012)   CAD (coronary artery disease), native coronary artery    a. Nonobstructive by cath 02/2012 (done because of low EF).   Chronic bronchitis ([place])    "~ every other year" (11/18/2012)   Chronic combined systolic and diastolic CHF (congestive heart failure) ([place])    a. 03/05/12 echo:  LVEF 20-25%, moderate LVH , inferior and basal to mid septal akinesis, anterior moderate to severe hypokinesis and grade 2 diastolic dysfunction. b. EF 07/2012: EF still 25% (unclear medication compliance).   Chronic lower back pain    Headache(784.0)    "often; maybe not daily" (11/18/2012)   High cholesterol    History of noncompliance with medical treatment    Hypertension    LBBB (left bundle branch block)    Orthopnea    Tobacco abuse    Type II diabetes mellitus ([place])    Past Surgical History:  Procedure Laterality Date   BI-VENTRICULAR IMPLANTABLE CARDIOVERTER DEFIBRILLATOR N/A 11/18/2012   Procedure: BI-VENTRICULAR IMPLANTABLE CARDIOVERTER DEFIBRILLATOR  (CRT-D);  Surgeon: Evans Lance, MD;  Location: [facility] CATH LAB;  Service: Cardiovascular;  Laterality: N/A;   BI-VENTRICULAR IMPLANTABLE CARDIOVERTER DEFIBRILLATOR  (CRT-D)  11/18/2012   BIV ICD GENERATOR CHANGEOUT N/A 11/22/2020   Procedure: BIV ICD GENERATOR CHANGEOUT;  Surgeon: Evans Lance, MD;  Location: [facility] LAB;  Service: Cardiovascular;  Laterality: N/A;   CARDIAC CATHETERIZATION  03/04/12   nonobstructive CAD, elevated LVEDP and tortuous vessels suggestive of long-standing hypertension   COLONOSCOPY WITH PROPOFOL N/A 12/12/2016   Procedure: COLONOSCOPY WITH PROPOFOL;  Surgeon:  Carol Ada, MD;  Location: [facility] ENDOSCOPY;  Service: Endoscopy;  Laterality: N/A;   JOINT REPLACEMENT     Bilateral hip and right knee   LEFT HEART CATH N/A 03/05/2012   Procedure: LEFT HEART CATH;  Surgeon: Larey Dresser, MD;  Location: [facility] CATH LAB;  Service: Cardiovascular;  Laterality: N/A;   Patient Active Problem List   Diagnosis Date Noted   Right foot infection 10/22/2017   Cellulitis in diabetic foot ([place]) 10/22/2017   Hyperglycemia 10/22/2017   Lumbar radiculopathy 09/18/2017   Degenerative spondylolisthesis 09/18/2017   Atrial fibrillation ([place]) 02/11/2017   Paroxysmal A-fib ([facility])    Biventricular automatic implantable cardioverter defibrillator in situ    Sepsis ([place]) 04/20/2016   UTI (urinary tract infection) 04/20/2016   Dyspnea 07/19/2015   Interstitial lung disease ([place]) 11/20/2014   SIRS (systemic inflammatory response syndrome) ([place]) 02/03/2014   IDDM (insulin dependent diabetes mellitus) 02/03/2014   Tachycardia 87/56/4332   Chronic systolic CHF (congestive heart failure) ([place]) 09/29/2012   Hypoxia 03/06/2012   Hypertensive heart disease 03/06/2012   Nonischemic cardiomyopathy ([place]) 03/06/2012   Tobacco abuse 03/06/2012   Type 2 diabetes mellitus ([place]) 03/06/2012   Hypertension 03/06/2012   CAD (coronary artery disease), native coronary artery 03/06/2012   Hypokalemia 03/06/2012   Acute bronchitis 02/01/2009   Sleep apnea 02/01/2009   Chest pain 02/01/2009    REFERRING DIAG: Risks for falls  THERAPY DIAG:   Chronic bilateral low back pain with left-sided sciatica  Muscle weakness (generalized)  Difficulty in walking, not elsewhere classified  Decreased ROM of lumbar spine  Risk for falls        PERTINENT HISTORY: Bilat THR, and R TKR, obesity, See Snapshot  PRECAUTIONS: None  SUBJECTIVE: Pt notes she is feeling good today, not experiencing low back or L knee pain PAIN:  Are you having pain? Yes VAS scale: 0/10 Pain location: Low back and  hip Pain orientation: Right PAIN TYPE: aching Pain description: intermittent  Aggravating factors: Standing and walking Relieving factors: Sitting and lying down  OBJECTIVE:    DIAGNOSTIC FINDINGS: none     COGNITION:          Overall cognitive status: Within functional limits for tasks assessed                        SENSATION:          Light touch: Deficits decrease for lower legs and feet- stocking pattern     POSTURE:  Forward head, decreased thoracic kyphosis, increased lumbar lordosis, anterior pelvic tilt   LE AROM/PROM:   Trunk AROM: Markedly decreased with all motions due to pain   LE MMT:   MMT Right 01/06/2021 Left 01/06/2021  Hip flexion 3 2  Hip extension 3 2+  Hip abduction 3 2+  Hip adduction 3 3  Hip internal rotation 3 2+  Hip external rotation 3 2+  Knee flexion 3 3  Knee extension 3+ 2+  Ankle dorsiflexion      Ankle plantarflexion      Ankle inversion      Ankle eversion       (Blank rows = not tested)     FUNCTIONAL TESTS:  Pt was not able to SLS L or R for greater than 1 sec   GAIT: Distance walked: 100 ft Assistive device utilized: None, needs SPC or RW Level of assistance: Modified independence Comments: Decreased pace and step length. Antalgic pattern over the L LE. 2WMT on 01/10/21: 161f with decreased pace and  step length    [facility] Adult PT Treatment:                                                DATE: 03/05/21 THerapeutic Exercise: Nu-step, 5 mins, L5, UE/LE - SLR c quad set, 2x10, L and R - Bridging, 2x10  - Supine PPT 2x10, 3" - supine marching 2x10 - HL clamshell 2x10,3", GTB - HL hip add sets c ball x15, 5" - Seated forward rollouts c physioball x2 forward and        laterally L and R - Seated LAQ, 2x10, 4# Therapeutic Exercise: *** Manual Therapy: *** Neuromuscular re-ed: *** Therapeutic Activity: *** Modalities: *** Self Care: ***   [facility]:                                                 DATE: 02/28/21 Therapeutic Exercise: Nu-step, 5 mins, L5, UE/LE - SLR c quad set, 2x10, L and R - Bridging, 2x10  - Supine PPT 2x10, 3" - supine marching 2x10 - HL clamshell 2x10,3", GTB - HL hip add sets c ball x15, 5" - Seated forward rollouts c physioball x2 forward and  laterally L and R - Seated LAQ, 2x10, 4#  Therapeutic Activity: BERG BALANCE  Sitting to Standing: Numbers; 0-4: 3  4. Stands without using hands and stabilize independently  3. Stands independently using hands  2. Stands using hands after multiple trials  1. Min A to stand  0. Mod-Max A to stand Standing unsupported: Numbers; 0-4: 4  4. Stands safely for 2 minutes  3. Stands 2 minutes with supervision  2. Stands 30 seconds unsupported  1. Needs several tries to stand unsupported for 30 seconds  0. Unable to stand unsupported for 30 seconds Sitting unsupported: Numbers; 0-4: 4  4. Sits for 2 minutes independently  3. Sits for 2 minutes with supervision  2. Able to sit 30 seconds  1. Able to sit 10 seconds  0. Unable to sit for 10 seconds Standing to Sitting: Numbers; 0-4: 3 4. Sits safely with minimal use of hands 3. Controls descent with hands 2. Uses back of legs against chair to control descent 1. Sits independently, but uncontrolled descent 0. Needs assistance Transfers: Numbers; 0-4: 3  4. Transfers safely with minor use of hands  3. Transfers safely definite use of hands  2. Transfers with verbal cueing/supervision  1. Needs 1 person assist  0. Needs 2 person assist  Standing with eyes closed: Numbers; 0-4: 4  4. Stands safely for 10 seconds  3. Stands 10 seconds with supervision   2. Able to stand for 3 seconds  1. Unable to keep eyes closed for 3 seconds, but is safe  0. Needs assist to keep from falling Standing with feet together: Numbers; 0-4: 2 4. Stands for 1 minute safely 3. Stands for 1 minute with supervision 2. Unable to hold for 30 seconds  1. Needs help to attain  position but can hold for 15 seconds  0. Needs help to attain position and unable to hold for 15 seconds Reaching forward with outstretched arm: Numbers; 0-4: 4  4. Reaches forward 10 inches  3. Reaches forward 5 inches  2. Reaches forward 2 inches  1. Reaches forward with supervision  0. Loses balance/requires assistace Retrieving object from the floor: Numbers; 0-4: 1 4. Able to pick up easily and safely 3. Able to pick up with supervision 2. Unable to pick up, but reaches within 1-2 inches independently 1. Unable to pick up and needs supervision 0. Unable/needs assistance to keep from falling  Turning to look behind: Numbers; 0-4: 4  4. Looks behind from both sides and weight shifts well  3. Looks behind one side only, other side less weight shift  2. Turns sideways only, maintains balance  1. Needs supervision when turning  0. Needs assistance  Turning 360 degrees: Numbers; 0-4: 3  4. Able to turn in </=4 seconds  3. Able to turn on one side in </= 4 seconds   2. Able to turn slowly, but safely  1. Needs supervision or verbal cueing  0. Needs assistance Place alternate foot on stool: Numbers; 0-4: 2 4. Completes 8 steps in 20 seconds 3. Completes 8 steps in >20 seconds 2. 4 steps without assistance/supervision 1. Completes >2 steps with minimal assist 0. Unable, needs assist to keep from falling Standing with one foot in front: Numbers; 0-4: 2  4. Independent tandem for 30 seconds  3. Independent foot ahead for 30 seconds  2. Independent small step for 30 seconds  1. Needs help to step, but can hold for 15 seconds  0. Loses balance while standing/stepping  Standing on one foot: Numbers; 0-4: 1 4. Holds >10 seconds 3. Holds 5-10 seconds 2. Holds >/=3 seconds  1. Holds <3 seconds 0. Unable   Total Score: 40/56   DLS, narrow base, x1 60" Off set tandem stance, x1, 60", each foot forward  Self Care: Balance activities added to HEP   [facility] Adult PT Treatment:                                                 DATE: 02/26/21  Therapeutic Exercise: - Nu-step, 5 mins, L5, UE/LE - SLR c quad set, 2x10, L and R - Bridging, 2x10  - Supine PPT 2x10, 3" - supine marching 2x10 - HL clamshell 2x10,3", GTB - HL hip add sets c ball x15, 5" - Supine, hamstring rollouts and back - Seated forward rollouts c physioball x2 forward and        laterally L and R - Seated LAQ c DF, x10,3" - Seated LAQ, 2x10, 4# - Seated hip side step, 2x10, GTB     PATIENT EDUCATION:  Education details: Reviewed and completed HEP and updated for PPT and LTR.  Person educated: Patient Education method: Explanation, Demonstration, Tactile cues, Verbal cues, and Handouts Education comprehension: verbalized understanding, returned demonstration, verbal cues required, and tactile cues required     HOME EXERCISE PROGRAM: Access Code: [email or web address] URL: [email or web address] Date: 02/28/2021 Prepared by: Gar Ponto  Exercises Supine Posterior Pelvic Tilt - 2 x daily - 7 x weekly - 1 sets - 10 reps - 3 hold Supine March - 2 x daily - 7 x weekly - 1 sets - 10 reps Hooklying Clamshell with Resistance - 2 x daily - 7 x weekly - 1 sets - 10 reps - 3 hold Supine Hip Adduction Isometric with Ball - 2 x daily - 7 x weekly - 1 sets - 10 reps - 5 hold Supine Bridge - 2 x daily - 7 x weekly - 1 sets - 10 reps - 3 hold Romberg Stance - 2 x daily - 7 x weekly - 1 sets - 3 reps - 30 hold Standing Romberg to 1/2 Tandem Stance - 2 x daily - 7 x weekly - 1 sets - 3 reps - 30 hold Seated Flexion Stretch with Swiss Ball - 2 x daily - 7 x weekly - 1 sets - 10 reps - 5 hold Supine Lower Trunk Rotation - 2 x daily - 7 x weekly - 1 sets - 5 reps - 5 hold Hooklying Single Knee to Chest Stretch - 2 x daily - 7 x weekly - 1 sets - 3 reps - 10 hold Supine Piriformis Stretch with Foot on Ground - 2 x daily - 7 x weekly - 1 sets - 3 reps - 20 hold Supine Figure 4 Piriformis Stretch - 2 x daily - 7 x  weekly - 3 sets - 3 reps - 20 hold  ASSESSMENT:   CLINICAL IMPRESSION: PT was completed for standing balance and for core and LE strengthening. Per observation, pt's balance is improved than on the initial eval, but Berg assessment indicates decreased balance esp c narrow base or SL. Pt's HEP was updated for balance activities. Pt is making appropriate progress with improve strength, functional mobility, and decreased pain. Pt will benefit from continued skilled PT to address strength, balance, and pain to improve functional mobility.  REHAB POTENTIAL: Fair    CLINICAL DECISION MAKING: Evolving/moderate complexity   EVALUATION COMPLEXITY: Moderate     GOALS:     SHORT TERM GOALS:   STG Name Target Date Goal status  1 Pt will be Ind in an initial HEP Baseline: started on eval 01/26/21 Met 02/28/21  2 Pt will voice understanding of measures to assist in the decrease and management of pain Baseline:  01/26/21 Met 02/28/21    LONG TERM GOALS:    LTG Name Target Date Goal status  1 Pt will be Ind in a final HEP to maintain achieved LOF Baseline:       03/09/21   INITIAL  2 Pt trunk ROM will improve to only moderate limitation as indication of decreased pain Baseline: Marked limitation 03/09/21 INITIAL  3 Pt's will report improved to LBP and L LE pain to 5/10 or less with daily activities Baseline:8/10 03/09/21 INITIAL  4 Pt will demonstrate improved L LE strength to 3/5 or greater and R LE strength to 4/5 or greater for improved functional mobility Baseline: see flowsheets 03/09/21 INITIAL  5 Pt's walking tolerance will improve to 339f s rest c or s and AD Baseline: 03/09/21 INITIAL  6 Pt's 2MWT will increase to 200+ ft s AD as indication of improve strength, balance, and pain. Assess Berg and set goal Baseline: 2MWT [id number]/11/23 INITIAL  7 Improve Berg to 45/50 as indication of improved balance and funtional mobility 03/09/21 INITIAL     PLAN: PT FREQUENCY: 2x/week   PT DURATION:  8 weeks   PLANNED INTERVENTIONS: Therapeutic exercises, Therapeutic activity, Neuro Muscular re-education, Balance training, Gait training, Patient/Family education, Joint mobilization, Stair training, Dry Needling, Electrical stimulation, Spinal mobilization, Cryotherapy, Moist heat, Taping, Traction, Ultrasound, Ionotophoresis 86m73ml Dexamethasone, and Manual therapy   PLAN FOR NEXT SESSION: Progress therex as indicated. Discuss walking program.     AllGar Ponto, PT 03/05/21 5:55 AM

## 2021-03-07 ENCOUNTER — Ambulatory Visit: Payer: Medicare Other

## 2021-03-07 NOTE — Therapy (Incomplete)
OUTPATIENT PHYSICAL THERAPY TREATMENT NOTE   Patient Name: [name] MRN: [id number] DOB:Oct 05, 1950, 71 y.o., female Today's Date: 03/07/2021  PCP: Sandi Mariscal, MD REFERRING PROVIDER: Jeanella Anton, NP         Past Medical History:  Diagnosis Date   Anemia    a. Noted on 07/2012 labs, instructed to f/u PCP.   Arthritis    "joints" (11/18/2012)   CAD (coronary artery disease), native coronary artery    a. Nonobstructive by cath 02/2012 (done because of low EF).   Chronic bronchitis ([place])    "~ every other year" (11/18/2012)   Chronic combined systolic and diastolic CHF (congestive heart failure) ([place])    a. 03/05/12 echo:  LVEF 20-25%, moderate LVH , inferior and basal to mid septal akinesis, anterior moderate to severe hypokinesis and grade 2 diastolic dysfunction. b. EF 07/2012: EF still 25% (unclear medication compliance).   Chronic lower back pain    Headache(784.0)    "often; maybe not daily" (11/18/2012)   High cholesterol    History of noncompliance with medical treatment    Hypertension    LBBB (left bundle branch block)    Orthopnea    Tobacco abuse    Type II diabetes mellitus ([place])    Past Surgical History:  Procedure Laterality Date   BI-VENTRICULAR IMPLANTABLE CARDIOVERTER DEFIBRILLATOR N/A 11/18/2012   Procedure: BI-VENTRICULAR IMPLANTABLE CARDIOVERTER DEFIBRILLATOR  (CRT-D);  Surgeon: Evans Lance, MD;  Location: [facility] CATH LAB;  Service: Cardiovascular;  Laterality: N/A;   BI-VENTRICULAR IMPLANTABLE CARDIOVERTER DEFIBRILLATOR  (CRT-D)  11/18/2012   BIV ICD GENERATOR CHANGEOUT N/A 11/22/2020   Procedure: BIV ICD GENERATOR CHANGEOUT;  Surgeon: Evans Lance, MD;  Location: [facility] CV LAB;  Service: Cardiovascular;  Laterality: N/A;   CARDIAC CATHETERIZATION  03/04/12   nonobstructive CAD, elevated LVEDP and tortuous vessels suggestive of long-standing hypertension   COLONOSCOPY WITH PROPOFOL N/A 12/12/2016   Procedure: COLONOSCOPY WITH PROPOFOL;   Surgeon: Carol Ada, MD;  Location: [facility] ENDOSCOPY;  Service: Endoscopy;  Laterality: N/A;   JOINT REPLACEMENT     Bilateral hip and right knee   LEFT HEART CATH N/A 03/05/2012   Procedure: LEFT HEART CATH;  Surgeon: Larey Dresser, MD;  Location: [facility] CATH LAB;  Service: Cardiovascular;  Laterality: N/A;   Patient Active Problem List   Diagnosis Date Noted   Right foot infection 10/22/2017   Cellulitis in diabetic foot ([place]) 10/22/2017   Hyperglycemia 10/22/2017   Lumbar radiculopathy 09/18/2017   Degenerative spondylolisthesis 09/18/2017   Atrial fibrillation ([place]) 02/11/2017   Paroxysmal A-fib ([facility])    Biventricular automatic implantable cardioverter defibrillator in situ    Sepsis ([place]) 04/20/2016   UTI (urinary tract infection) 04/20/2016   Dyspnea 07/19/2015   Interstitial lung disease ([place]) 11/20/2014   SIRS (systemic inflammatory response syndrome) ([place]) 02/03/2014   IDDM (insulin dependent diabetes mellitus) 02/03/2014   Tachycardia 97/28/2060   Chronic systolic CHF (congestive heart failure) ([place]) 09/29/2012   Hypoxia 03/06/2012   Hypertensive heart disease 03/06/2012   Nonischemic cardiomyopathy ([place]) 03/06/2012   Tobacco abuse 03/06/2012   Type 2 diabetes mellitus ([place]) 03/06/2012   Hypertension 03/06/2012   CAD (coronary artery disease), native coronary artery 03/06/2012   Hypokalemia 03/06/2012   Acute bronchitis 02/01/2009   Sleep apnea 02/01/2009   Chest pain 02/01/2009    REFERRING DIAG: Risks for falls  THERAPY DIAG:   Chronic bilateral low back pain with left-sided sciatica  Muscle weakness (generalized)  Difficulty in walking, not elsewhere classified  Decreased ROM of lumbar spine  Risk for falls        PERTINENT HISTORY: Bilat THR, and R TKR, obesity, See Snapshot  PRECAUTIONS: None  SUBJECTIVE: Pt notes she is feeling good today, not experiencing low back or L knee pain PAIN:  Are you having pain? Yes VAS scale: 0/10 Pain location: Low  back and hip Pain orientation: Right PAIN TYPE: aching Pain description: intermittent  Aggravating factors: Standing and walking Relieving factors: Sitting and lying down  OBJECTIVE:    DIAGNOSTIC FINDINGS: none     COGNITION:          Overall cognitive status: Within functional limits for tasks assessed                        SENSATION:          Light touch: Deficits decrease for lower legs and feet- stocking pattern     POSTURE:  Forward head, decreased thoracic kyphosis, increased lumbar lordosis, anterior pelvic tilt   LE AROM/PROM:   Trunk AROM: Markedly decreased with all motions due to pain   LE MMT:   MMT Right 01/06/2021 Left 01/06/2021  Hip flexion 3 2  Hip extension 3 2+  Hip abduction 3 2+  Hip adduction 3 3  Hip internal rotation 3 2+  Hip external rotation 3 2+  Knee flexion 3 3  Knee extension 3+ 2+  Ankle dorsiflexion      Ankle plantarflexion      Ankle inversion      Ankle eversion       (Blank rows = not tested)     FUNCTIONAL TESTS:  Pt was not able to SLS L or R for greater than 1 sec   GAIT: Distance walked: 100 ft Assistive device utilized: None, needs SPC or RW Level of assistance: Modified independence Comments: Decreased pace and step length. Antalgic pattern over the L LE. 2WMT on 01/10/21: 167f with decreased pace and  step length    [facility] Adult PT Treatment:                                                DATE: 03/07/21 THerapeutic Exercise: Nu-step, 5 mins, L5, UE/LE - SLR c quad set, 2x10, L and R - Bridging, 2x10  - Supine PPT 2x10, 3" - supine marching 2x10 - HL clamshell 2x10,3", GTB - HL hip add sets c ball x15, 5" - Seated forward rollouts c physioball x2 forward and        laterally L and R - Seated LAQ, 2x10, 4# Therapeutic Exercise: *** Manual Therapy: *** Neuromuscular re-ed: *** Therapeutic Activity: *** Modalities: *** Self Care: ***   [facility] Treatment:                                                 DATE: 02/28/21 Therapeutic Exercise: Nu-step, 5 mins, L5, UE/LE - SLR c quad set, 2x10, L and R - Bridging, 2x10  - Supine PPT 2x10, 3" - supine marching 2x10 - HL clamshell 2x10,3", GTB - HL hip add sets c ball x15, 5" - Seated forward rollouts c physioball x2 forward and  laterally L and R - Seated LAQ, 2x10, 4#  Therapeutic Activity: BERG BALANCE  Sitting to Standing: Numbers; 0-4: 3  4. Stands without using hands and stabilize independently  3. Stands independently using hands  2. Stands using hands after multiple trials  1. Min A to stand  0. Mod-Max A to stand Standing unsupported: Numbers; 0-4: 4  4. Stands safely for 2 minutes  3. Stands 2 minutes with supervision  2. Stands 30 seconds unsupported  1. Needs several tries to stand unsupported for 30 seconds  0. Unable to stand unsupported for 30 seconds Sitting unsupported: Numbers; 0-4: 4  4. Sits for 2 minutes independently  3. Sits for 2 minutes with supervision  2. Able to sit 30 seconds  1. Able to sit 10 seconds  0. Unable to sit for 10 seconds Standing to Sitting: Numbers; 0-4: 3 4. Sits safely with minimal use of hands 3. Controls descent with hands 2. Uses back of legs against chair to control descent 1. Sits independently, but uncontrolled descent 0. Needs assistance Transfers: Numbers; 0-4: 3  4. Transfers safely with minor use of hands  3. Transfers safely definite use of hands  2. Transfers with verbal cueing/supervision  1. Needs 1 person assist  0. Needs 2 person assist  Standing with eyes closed: Numbers; 0-4: 4  4. Stands safely for 10 seconds  3. Stands 10 seconds with supervision   2. Able to stand for 3 seconds  1. Unable to keep eyes closed for 3 seconds, but is safe  0. Needs assist to keep from falling Standing with feet together: Numbers; 0-4: 2 4. Stands for 1 minute safely 3. Stands for 1 minute with supervision 2. Unable to hold for 30 seconds  1. Needs help to  attain position but can hold for 15 seconds  0. Needs help to attain position and unable to hold for 15 seconds Reaching forward with outstretched arm: Numbers; 0-4: 4  4. Reaches forward 10 inches  3. Reaches forward 5 inches  2. Reaches forward 2 inches  1. Reaches forward with supervision  0. Loses balance/requires assistace Retrieving object from the floor: Numbers; 0-4: 1 4. Able to pick up easily and safely 3. Able to pick up with supervision 2. Unable to pick up, but reaches within 1-2 inches independently 1. Unable to pick up and needs supervision 0. Unable/needs assistance to keep from falling  Turning to look behind: Numbers; 0-4: 4  4. Looks behind from both sides and weight shifts well  3. Looks behind one side only, other side less weight shift  2. Turns sideways only, maintains balance  1. Needs supervision when turning  0. Needs assistance  Turning 360 degrees: Numbers; 0-4: 3  4. Able to turn in </=4 seconds  3. Able to turn on one side in </= 4 seconds   2. Able to turn slowly, but safely  1. Needs supervision or verbal cueing  0. Needs assistance Place alternate foot on stool: Numbers; 0-4: 2 4. Completes 8 steps in 20 seconds 3. Completes 8 steps in >20 seconds 2. 4 steps without assistance/supervision 1. Completes >2 steps with minimal assist 0. Unable, needs assist to keep from falling Standing with one foot in front: Numbers; 0-4: 2  4. Independent tandem for 30 seconds  3. Independent foot ahead for 30 seconds  2. Independent small step for 30 seconds  1. Needs help to step, but can hold for 15 seconds  0. Loses balance while standing/stepping  Standing on one foot: Numbers; 0-4: 1 4. Holds >10 seconds 3. Holds 5-10 seconds 2. Holds >/=3 seconds  1. Holds <3 seconds 0. Unable   Total Score: 40/56   DLS, narrow base, x1 60" Off set tandem stance, x1, 60", each foot forward  Self Care: Balance activities added to HEP   [facility] Adult PT Treatment:                                                 DATE: 02/26/21  Therapeutic Exercise: - Nu-step, 5 mins, L5, UE/LE - SLR c quad set, 2x10, L and R - Bridging, 2x10  - Supine PPT 2x10, 3" - supine marching 2x10 - HL clamshell 2x10,3", GTB - HL hip add sets c ball x15, 5" - Supine, hamstring rollouts and back - Seated forward rollouts c physioball x2 forward and        laterally L and R - Seated LAQ c DF, x10,3" - Seated LAQ, 2x10, 4# - Seated hip side step, 2x10, GTB     PATIENT EDUCATION:  Education details: Reviewed and completed HEP and updated for PPT and LTR.  Person educated: Patient Education method: Explanation, Demonstration, Tactile cues, Verbal cues, and Handouts Education comprehension: verbalized understanding, returned demonstration, verbal cues required, and tactile cues required     HOME EXERCISE PROGRAM: Access Code: [email or web address] URL: [email or web address] Date: 02/28/2021 Prepared by: Gar Ponto  Exercises Supine Posterior Pelvic Tilt - 2 x daily - 7 x weekly - 1 sets - 10 reps - 3 hold Supine March - 2 x daily - 7 x weekly - 1 sets - 10 reps Hooklying Clamshell with Resistance - 2 x daily - 7 x weekly - 1 sets - 10 reps - 3 hold Supine Hip Adduction Isometric with Ball - 2 x daily - 7 x weekly - 1 sets - 10 reps - 5 hold Supine Bridge - 2 x daily - 7 x weekly - 1 sets - 10 reps - 3 hold Romberg Stance - 2 x daily - 7 x weekly - 1 sets - 3 reps - 30 hold Standing Romberg to 1/2 Tandem Stance - 2 x daily - 7 x weekly - 1 sets - 3 reps - 30 hold Seated Flexion Stretch with Swiss Ball - 2 x daily - 7 x weekly - 1 sets - 10 reps - 5 hold Supine Lower Trunk Rotation - 2 x daily - 7 x weekly - 1 sets - 5 reps - 5 hold Hooklying Single Knee to Chest Stretch - 2 x daily - 7 x weekly - 1 sets - 3 reps - 10 hold Supine Piriformis Stretch with Foot on Ground - 2 x daily - 7 x weekly - 1 sets - 3 reps - 20 hold Supine Figure 4 Piriformis Stretch - 2 x daily - 7  x weekly - 3 sets - 3 reps - 20 hold  ASSESSMENT:   CLINICAL IMPRESSION: PT was completed for standing balance and for core and LE strengthening. Per observation, pt's balance is improved than on the initial eval, but Berg assessment indicates decreased balance esp c narrow base or SL. Pt's HEP was updated for balance activities. Pt is making appropriate progress with improve strength, functional mobility, and decreased pain. Pt will benefit from continued skilled PT to address strength, balance, and pain to improve functional mobility.  REHAB POTENTIAL: Fair    CLINICAL DECISION MAKING: Evolving/moderate complexity   EVALUATION COMPLEXITY: Moderate     GOALS:     SHORT TERM GOALS:   STG Name Target Date Goal status  1 Pt will be Ind in an initial HEP Baseline: started on eval 01/26/21 Met 02/28/21  2 Pt will voice understanding of measures to assist in the decrease and management of pain Baseline:  01/26/21 Met 02/28/21    LONG TERM GOALS:    LTG Name Target Date Goal status  1 Pt will be Ind in a final HEP to maintain achieved LOF Baseline:       03/09/21   INITIAL  2 Pt trunk ROM will improve to only moderate limitation as indication of decreased pain Baseline: Marked limitation 03/09/21 INITIAL  3 Pt's will report improved to LBP and L LE pain to 5/10 or less with daily activities Baseline:8/10 03/09/21 INITIAL  4 Pt will demonstrate improved L LE strength to 3/5 or greater and R LE strength to 4/5 or greater for improved functional mobility Baseline: see flowsheets 03/09/21 INITIAL  5 Pt's walking tolerance will improve to 366f s rest c or s and AD Baseline: 03/09/21 INITIAL  6 Pt's 2MWT will increase to 200+ ft s AD as indication of improve strength, balance, and pain. Assess Berg and set goal Baseline: 2MWT [id number]/11/23 INITIAL  7 Improve Berg to 45/50 as indication of improved balance and funtional mobility 03/09/21 INITIAL     PLAN: PT FREQUENCY: 2x/week   PT  DURATION: 8 weeks   PLANNED INTERVENTIONS: Therapeutic exercises, Therapeutic activity, Neuro Muscular re-education, Balance training, Gait training, Patient/Family education, Joint mobilization, Stair training, Dry Needling, Electrical stimulation, Spinal mobilization, Cryotherapy, Moist heat, Taping, Traction, Ultrasound, Ionotophoresis 20m42ml Dexamethasone, and Manual therapy   PLAN FOR NEXT SESSION: Progress therex as indicated. Discuss walking program.     AllGar Ponto, PT 03/07/21 8:20 AM

## 2021-03-08 ENCOUNTER — Telehealth: Payer: Self-pay

## 2021-03-08 NOTE — Telephone Encounter (Signed)
Called pt regarding no show visit for 03/08/21. Advised pt she has no current visits scheduled. Pt stated she is doing better and was ready to be Dced. Pt was encouraged to continue her HEP.

## 2021-03-18 ENCOUNTER — Other Ambulatory Visit (HOSPITAL_COMMUNITY): Payer: Self-pay

## 2021-03-19 ENCOUNTER — Other Ambulatory Visit (HOSPITAL_COMMUNITY): Payer: Self-pay | Admitting: Cardiology

## 2021-03-19 MED ORDER — ISOSORB DINITRATE-HYDRALAZINE 20-37.5 MG PO TABS: 2.0000 | ORAL_TABLET | Freq: Three times a day (TID) | ORAL | 1 refills | Status: DC

## 2021-03-26 ENCOUNTER — Other Ambulatory Visit (HOSPITAL_COMMUNITY): Payer: Self-pay

## 2021-04-02 ENCOUNTER — Other Ambulatory Visit: Payer: Self-pay | Admitting: Family Medicine

## 2021-04-02 DIAGNOSIS — F17200 Nicotine dependence, unspecified, uncomplicated: Secondary | ICD-10-CM

## 2021-04-05 ENCOUNTER — Emergency Department (HOSPITAL_COMMUNITY)
Admission: EM | Admit: 2021-04-05 | Discharge: 2021-04-05 | Disposition: A | Discharge status: Home / Self Care | Payer: Medicare Other | Attending: Emergency Medicine | Admitting: Emergency Medicine

## 2021-04-05 ENCOUNTER — Emergency Department (HOSPITAL_COMMUNITY): Payer: Medicare Other

## 2021-04-05 ENCOUNTER — Encounter (HOSPITAL_COMMUNITY): Payer: Self-pay | Admitting: Emergency Medicine

## 2021-04-05 ENCOUNTER — Ambulatory Visit: Payer: Self-pay | Admitting: *Deleted

## 2021-04-05 ENCOUNTER — Other Ambulatory Visit: Payer: Self-pay

## 2021-04-05 DIAGNOSIS — I110 Hypertensive heart disease with heart failure: Secondary | ICD-10-CM | POA: Insufficient documentation

## 2021-04-05 DIAGNOSIS — M5441 Lumbago with sciatica, right side: Secondary | ICD-10-CM | POA: Insufficient documentation

## 2021-04-05 DIAGNOSIS — Z79899 Other long term (current) drug therapy: Secondary | ICD-10-CM | POA: Insufficient documentation

## 2021-04-05 DIAGNOSIS — G8929 Other chronic pain: Secondary | ICD-10-CM | POA: Diagnosis not present

## 2021-04-05 DIAGNOSIS — I509 Heart failure, unspecified: Secondary | ICD-10-CM | POA: Diagnosis not present

## 2021-04-05 DIAGNOSIS — Z794 Long term (current) use of insulin: Secondary | ICD-10-CM | POA: Diagnosis not present

## 2021-04-05 DIAGNOSIS — M5442 Lumbago with sciatica, left side: Secondary | ICD-10-CM | POA: Diagnosis not present

## 2021-04-05 DIAGNOSIS — Y92410 Unspecified street and highway as the place of occurrence of the external cause: Secondary | ICD-10-CM | POA: Diagnosis not present

## 2021-04-05 DIAGNOSIS — Z7901 Long term (current) use of anticoagulants: Secondary | ICD-10-CM | POA: Insufficient documentation

## 2021-04-05 DIAGNOSIS — E119 Type 2 diabetes mellitus without complications: Secondary | ICD-10-CM | POA: Insufficient documentation

## 2021-04-05 DIAGNOSIS — Z7984 Long term (current) use of oral hypoglycemic drugs: Secondary | ICD-10-CM | POA: Diagnosis not present

## 2021-04-05 DIAGNOSIS — M545 Low back pain, unspecified: Secondary | ICD-10-CM | POA: Diagnosis present

## 2021-04-05 MED ORDER — KETOROLAC TROMETHAMINE 15 MG/ML IJ SOLN
15.0000 mg | Freq: Once | INTRAMUSCULAR | Status: AC
  Administered 2021-04-05: 15 mg via INTRAMUSCULAR
  Filled 2021-04-05 (×2): qty 1

## 2021-04-05 MED ORDER — CYCLOBENZAPRINE HCL 5 MG PO TABS
7.5000 mg | ORAL_TABLET | Freq: Once | ORAL | Status: AC
  Administered 2021-04-05: 7.5 mg via ORAL
  Filled 2021-04-05: qty 1.5

## 2021-04-05 MED ORDER — CYCLOBENZAPRINE HCL 7.5 MG PO TABS: 7.5000 mg | ORAL_TABLET | Freq: Two times a day (BID) | ORAL | 0 refills | Status: DC | PRN
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
## 2021-04-05 NOTE — Discharge Instructions (Signed)
Return to ED with any new symptoms such as groin numbness, bowel or bladder dysfunction, lower extremity weakness ?Please follow-up with your primary care provider or your orthopedist in the next 5 to 7 days for any ongoing pain ?Please pick up prescription medication I have sent in for you ?

## 2021-04-08 ENCOUNTER — Encounter: Payer: Medicare Other | Admitting: Student

## 2021-04-08 NOTE — Progress Notes (Deleted)
Electrophysiology Office Note Date: 04/08/2021  ID:  [name], DOB 21-May-1950, MRN [id number]  PCP: Salli Real, MD Primary Cardiologist: Marca Ancona, MD Electrophysiologist: Lewayne Bunting, MD   CC: Routine ICD follow-up  [name] is a 71 y.o. female seen today for Lewayne Bunting, MD for routine electrophysiology followup.  Since last being seen in our clinic the patient reports doing ***.  she denies chest pain, palpitations, dyspnea, PND, orthopnea, nausea, vomiting, dizziness, syncope, edema, weight gain, or early satiety. She has not had ICD shocks.   Device History: Medtronic BiV ICD implanted 2014, gen change 10/2020 for CHF   Past Medical History:  Diagnosis Date   Anemia    a. Noted on 07/2012 labs, instructed to f/u PCP.   Arthritis    "joints" (11/18/2012)   CAD (coronary artery disease), native coronary artery    a. Nonobstructive by cath 02/2012 (done because of low EF).   Chronic bronchitis (HCC)    "~ every other year" (11/18/2012)   Chronic combined systolic and diastolic CHF (congestive heart failure) (HCC)    a. 03/05/12 echo:  LVEF 20-25%, moderate LVH , inferior and basal to mid septal akinesis, anterior moderate to severe hypokinesis and grade 2 diastolic dysfunction. b. EF 07/2012: EF still 25% (unclear medication compliance).   Chronic lower back pain    Headache(784.0)    "often; maybe not daily" (11/18/2012)   High cholesterol    History of noncompliance with medical treatment    Hypertension    LBBB (left bundle branch block)    Orthopnea    Tobacco abuse    Type II diabetes mellitus (HCC)    Past Surgical History:  Procedure Laterality Date   BI-VENTRICULAR IMPLANTABLE CARDIOVERTER DEFIBRILLATOR N/A 11/18/2012   Procedure: BI-VENTRICULAR IMPLANTABLE CARDIOVERTER DEFIBRILLATOR  (CRT-D);  Surgeon: Marinus Maw, MD;  Location: [facility] CATH LAB;  Service: Cardiovascular;  Laterality: N/A;   BI-VENTRICULAR IMPLANTABLE CARDIOVERTER DEFIBRILLATOR   (CRT-D)  11/18/2012   BIV ICD GENERATOR CHANGEOUT N/A 11/22/2020   Procedure: BIV ICD GENERATOR CHANGEOUT;  Surgeon: Marinus Maw, MD;  Location: [facility] INVASIVE CV LAB;  Service: Cardiovascular;  Laterality: N/A;   CARDIAC CATHETERIZATION  03/04/12   nonobstructive CAD, elevated LVEDP and tortuous vessels suggestive of long-standing hypertension   COLONOSCOPY WITH PROPOFOL N/A 12/12/2016   Procedure: COLONOSCOPY WITH PROPOFOL;  Surgeon: Jeani Hawking, MD;  Location: [facility] ENDOSCOPY;  Service: Endoscopy;  Laterality: N/A;   JOINT REPLACEMENT     Bilateral hip and right knee   LEFT HEART CATH N/A 03/05/2012   Procedure: LEFT HEART CATH;  Surgeon: Laurey Morale, MD;  Location: [facility] CATH LAB;  Service: Cardiovascular;  Laterality: N/A;    Current Outpatient Medications  Medication Sig Dispense Refill   albuterol (VENTOLIN HFA) 108 (90 Base) MCG/ACT inhaler Inhale 2 puffs into the lungs every 6 (six) hours as needed for wheezing or shortness of breath. 18 g 0   apixaban (ELIQUIS) 5 MG TABS tablet TAKE 1 TABLET(5 MG) BY MOUTH TWICE DAILY 180 tablet 2   atorvastatin (LIPITOR) 40 MG tablet Take 1 tablet by mouth once daily 90 tablet 3   baclofen (LIORESAL) 10 MG tablet Take 10 mg by mouth 2 (two) times daily.     carvedilol (COREG) 25 MG tablet TAKE ONE TABLET BY MOUTH TWICE A DAY WITH MEALS 180 tablet 2   cyclobenzaprine (FEXMID) 7.5 MG tablet Take 1 tablet (7.5 mg total) by mouth 2 (two) times daily as needed for  muscle spasms. 20 tablet 0   ENTRESTO 97-103 MG TAKE ONE TABLET BY MOUTH TWICE A DAY 180 tablet 2   furosemide (LASIX) 20 MG tablet Take 3 tablets (60 mg total) by mouth daily. 270 tablet 3   gabapentin (NEURONTIN) 100 MG capsule Take 100 mg by mouth 3 (three) times daily.     glucose blood (FREESTYLE TEST STRIPS) test strip Use as instructed 100 each 0   glucose monitoring kit (FREESTYLE) monitoring kit 1 each by Does not apply route 4 (four) times daily - after meals and at bedtime. 1 month  Diabetic Testing Supplies for QAC-QHS accuchecks. 1 each 1   HUMALOG KWIKPEN 100 UNIT/ML KwikPen Sliding scale     insulin glargine (LANTUS) 100 UNIT/ML Solostar Pen Inject 50 Units into the skin at bedtime.     Insulin Pen Needle 32G X 8 MM MISC Use as directed 100 each 0   isosorbide-hydrALAZINE (BIDIL) 20-37.5 MG tablet Take 2 tablets by mouth 3 (three) times daily. NEEDS APPOINTMENT FOR MORE REFILLS 270 tablet 1   Lancets (FREESTYLE) lancets Use as instructed 100 each 0   metFORMIN (GLUMETZA) 1000 MG (MOD) 24 hr tablet Take 1,000 mg by mouth in the morning and at bedtime.     ondansetron (ZOFRAN) 4 MG tablet Take 1 tablet (4 mg total) by mouth every 6 (six) hours. 12 tablet 0   Oxycodone HCl 10 MG TABS Take 10 mg by mouth 3 (three) times daily as needed (pain).     potassium chloride SA (KLOR-CON) 10 MEQ tablet Take 6 tablets (60 mEq total) by mouth daily. (Patient not taking: Reported on 11/20/2020) 540 tablet 3   spironolactone (ALDACTONE) 25 MG tablet TAKE 1 TABLET(25 MG) BY MOUTH DAILY 90 tablet 3   Tafamidis (VYNDAMAX) 61 MG CAPS TAKE 1 CAPSULE (61 MG) BY MOUTH DAILY. 90 capsule 3   No current facility-administered medications for this visit.    Allergies:   Morphine and related and Penicillins   Social History: Social History   Socioeconomic History   Marital status: Single    Spouse name: Not on file   Number of children: 4   Years of education: 11   Highest education level: Not on file  Occupational History   Not on file  Tobacco Use   Smoking status: Former    Packs/day: 1.00    Years: 40.00    Pack years: 40.00    Types: Cigarettes    Start date: 06/23/1972    Quit date: 03/26/2012    Years since quitting: 9.0   Smokeless tobacco: Never  Vaping Use   Vaping Use: Never used  Substance and Sexual Activity   Alcohol use: No   Drug use: No   Sexual activity: Yes  Other Topics Concern   Not on file  Social History Narrative   Not on file   Social Determinants  of Health   Financial Resource Strain: Not on file  Food Insecurity: Not on file  Transportation Needs: Not on file  Physical Activity: Not on file  Stress: Not on file  Social Connections: Not on file  Intimate Partner Violence: Not on file    Family History: Family History  Problem Relation Age of Onset   Heart failure Mother    Heart disease Neg Hx     Review of Systems: All other systems reviewed and are otherwise negative except as noted above.   Physical Exam: There were no vitals filed for this visit.   GEN-  The patient is well appearing, alert and oriented x 3 today.   HEENT: normocephalic, atraumatic; sclera clear, conjunctiva pink; hearing intact; oropharynx clear; neck supple, no JVP Lymph- no cervical lymphadenopathy Lungs- Clear to ausculation bilaterally, normal work of breathing.  No wheezes, rales, rhonchi Heart- Regular rate and rhythm, no murmurs, rubs or gallops, PMI not laterally displaced GI- soft, non-tender, non-distended, bowel sounds present, no hepatosplenomegaly Extremities- no clubbing or cyanosis. No edema; DP/PT/radial pulses 2+ bilaterally MS- no significant deformity or atrophy Skin- warm and dry, no rash or lesion; ICD pocket well healed Psych- euthymic mood, full affect Neuro- strength and sensation are intact  ICD interrogation- reviewed in detail today,  See PACEART report  EKG:  EKG {ACTION; IS/IS [id number]} ordered today. Personal review of EKG ordered {Blank single:19197::"today","***"} shows ***  Recent Labs: 05/18/2020: ALT 18 07/31/2020: B Natriuretic Peptide 123.4 11/21/2020: BUN 24; Creatinine, Ser 0.98; Hemoglobin 12.2; Platelets 377; Potassium 4.7; Sodium 138   Wt Readings from Last 3 Encounters:  01/24/21 240 lb (108.9 kg)  11/22/20 240 lb (108.9 kg)  10/30/20 245 lb (111.1 kg)     Other studies Reviewed: Additional studies/ records that were reviewed today include: Previous EP office notes.   Assessment and  Plan:  1.  Chronic systolic dysfunction s/p Medtronic CRT-D  euvolemic today Stable on an appropriate medical regimen Normal ICD function See Pace Art report No changes today  2. Paroxysmal atrial fibrillation Burden *** Continue eliquis Labs today.   3. HTN Stable on current regimen   4. HLD Continue lipitor   Current medicines are reviewed at length with the patient today.   =  Labs/ tests ordered today include: *** No orders of the defined types were placed in this encounter.   Disposition:   Follow up with Dr. Ladona Ridgel in 6 months. Overdue for HF follow up with Dr. Shirlee Latch.   Dustin Flock, PA-C  04/08/2021 8:13 AM  [facility] [address] [place] [address] [phone] (office) [phone] (fax)

## 2021-04-12 ENCOUNTER — Other Ambulatory Visit (HOSPITAL_COMMUNITY): Payer: Self-pay

## 2021-04-15 ENCOUNTER — Telehealth (HOSPITAL_COMMUNITY): Payer: Self-pay

## 2021-04-15 NOTE — Telephone Encounter (Signed)
Received a fax requesting medical records from [facility]. Records were successfully faxed to: [phone] ,which was the number provided.. Medical request form will be scanned into patients chart.  ? ?

## 2021-04-16 ENCOUNTER — Other Ambulatory Visit (HOSPITAL_COMMUNITY): Payer: Self-pay

## 2021-04-17 ENCOUNTER — Other Ambulatory Visit (HOSPITAL_COMMUNITY): Payer: Self-pay

## 2021-04-18 ENCOUNTER — Other Ambulatory Visit (HOSPITAL_COMMUNITY): Payer: Self-pay

## 2021-04-24 ENCOUNTER — Other Ambulatory Visit: Payer: Medicare Other

## 2021-05-01 DIAGNOSIS — G4730 Sleep apnea, unspecified: Secondary | ICD-10-CM

## 2021-05-07 ENCOUNTER — Other Ambulatory Visit (HOSPITAL_COMMUNITY): Payer: Self-pay

## 2021-05-08 ENCOUNTER — Other Ambulatory Visit: Payer: Self-pay | Admitting: Family Medicine

## 2021-05-08 DIAGNOSIS — R29898 Other symptoms and signs involving the musculoskeletal system: Secondary | ICD-10-CM

## 2021-05-08 DIAGNOSIS — G8334 Monoplegia, unspecified affecting left nondominant side: Secondary | ICD-10-CM

## 2021-05-08 DIAGNOSIS — M545 Low back pain, unspecified: Secondary | ICD-10-CM

## 2021-05-09 ENCOUNTER — Other Ambulatory Visit (HOSPITAL_COMMUNITY): Payer: Self-pay

## 2021-05-13 ENCOUNTER — Inpatient Hospital Stay: Admission: RE | Admit: 2021-05-13 | Payer: Medicare Other | Source: Ambulatory Visit

## 2021-05-16 ENCOUNTER — Other Ambulatory Visit (HOSPITAL_COMMUNITY): Payer: Self-pay

## 2021-05-20 ENCOUNTER — Other Ambulatory Visit (HOSPITAL_COMMUNITY): Payer: Self-pay | Admitting: Cardiology

## 2021-05-22 ENCOUNTER — Other Ambulatory Visit (HOSPITAL_COMMUNITY): Payer: Self-pay

## 2021-05-23 ENCOUNTER — Ambulatory Visit (INDEPENDENT_AMBULATORY_CARE_PROVIDER_SITE_OTHER): Payer: Medicare Other

## 2021-05-23 DIAGNOSIS — I428 Other cardiomyopathies: Secondary | ICD-10-CM | POA: Diagnosis not present

## 2021-05-24 LAB — CUP PACEART REMOTE DEVICE CHECK
Battery Remaining Longevity: 97 mo
Battery Voltage: 3 V
Brady Statistic AP VP Percent: 0.05 %
Brady Statistic AP VS Percent: 0.01 %
Brady Statistic AS VP Percent: 98.57 %
Brady Statistic AS VS Percent: 1.37 %
Brady Statistic RA Percent Paced: 0.06 %
Brady Statistic RV Percent Paced: 25.43 %
Date Time Interrogation Session: 20230428100257
HighPow Impedance: 55 Ohm
Implantable Lead Implant Date: 20141023
Implantable Lead Implant Date: 20141023
Implantable Lead Implant Date: 20141023
Implantable Lead Location: 753858
Implantable Lead Location: 753859
Implantable Lead Location: 753860
Implantable Lead Model: 4298
Implantable Lead Model: 5076
Implantable Lead Model: 6935
Implantable Pulse Generator Implant Date: 20221027
Lead Channel Impedance Value: 147.097
Lead Channel Impedance Value: 147.097
Lead Channel Impedance Value: 152 Ohm
Lead Channel Impedance Value: 155.455
Lead Channel Impedance Value: 160.941
Lead Channel Impedance Value: 285 Ohm
Lead Channel Impedance Value: 304 Ohm
Lead Channel Impedance Value: 304 Ohm
Lead Channel Impedance Value: 342 Ohm
Lead Channel Impedance Value: 399 Ohm
Lead Channel Impedance Value: 418 Ohm
Lead Channel Impedance Value: 418 Ohm
Lead Channel Impedance Value: 475 Ohm
Lead Channel Impedance Value: 513 Ohm
Lead Channel Impedance Value: 513 Ohm
Lead Channel Impedance Value: 532 Ohm
Lead Channel Impedance Value: 532 Ohm
Lead Channel Impedance Value: 551 Ohm
Lead Channel Pacing Threshold Amplitude: 0.375 V
Lead Channel Pacing Threshold Amplitude: 0.5 V
Lead Channel Pacing Threshold Amplitude: 1.625 V
Lead Channel Pacing Threshold Pulse Width: 0.4 ms
Lead Channel Pacing Threshold Pulse Width: 0.4 ms
Lead Channel Pacing Threshold Pulse Width: 0.4 ms
Lead Channel Sensing Intrinsic Amplitude: 10.125 mV
Lead Channel Sensing Intrinsic Amplitude: 10.125 mV
Lead Channel Sensing Intrinsic Amplitude: 2.875 mV
Lead Channel Sensing Intrinsic Amplitude: 2.875 mV
Lead Channel Setting Pacing Amplitude: 1.5 V
Lead Channel Setting Pacing Amplitude: 2.5 V
Lead Channel Setting Pacing Amplitude: 2.5 V
Lead Channel Setting Pacing Pulse Width: 0.4 ms
Lead Channel Setting Pacing Pulse Width: 0.4 ms
Lead Channel Setting Sensing Sensitivity: 0.45 mV

## 2021-06-05 ENCOUNTER — Ambulatory Visit (HOSPITAL_COMMUNITY)
Admission: RE | Admit: 2021-06-05 | Discharge: 2021-06-05 | Disposition: A | Discharge status: Home / Self Care | Payer: Medicare Other | Source: Ambulatory Visit | Attending: Cardiology | Admitting: Cardiology

## 2021-06-05 ENCOUNTER — Encounter (HOSPITAL_COMMUNITY): Payer: Self-pay | Admitting: Cardiology

## 2021-06-05 VITALS — BP 124/80 | HR 86 | Wt 232.6 lb

## 2021-06-05 DIAGNOSIS — E785 Hyperlipidemia, unspecified: Secondary | ICD-10-CM | POA: Insufficient documentation

## 2021-06-05 DIAGNOSIS — E859 Amyloidosis, unspecified: Secondary | ICD-10-CM | POA: Diagnosis not present

## 2021-06-05 DIAGNOSIS — I110 Hypertensive heart disease with heart failure: Secondary | ICD-10-CM | POA: Insufficient documentation

## 2021-06-05 DIAGNOSIS — Z8679 Personal history of other diseases of the circulatory system: Secondary | ICD-10-CM | POA: Insufficient documentation

## 2021-06-05 DIAGNOSIS — I447 Left bundle-branch block, unspecified: Secondary | ICD-10-CM | POA: Diagnosis not present

## 2021-06-05 DIAGNOSIS — Z79899 Other long term (current) drug therapy: Secondary | ICD-10-CM | POA: Insufficient documentation

## 2021-06-05 DIAGNOSIS — I43 Cardiomyopathy in diseases classified elsewhere: Secondary | ICD-10-CM | POA: Diagnosis not present

## 2021-06-05 DIAGNOSIS — M1990 Unspecified osteoarthritis, unspecified site: Secondary | ICD-10-CM

## 2021-06-05 DIAGNOSIS — Z7901 Long term (current) use of anticoagulants: Secondary | ICD-10-CM | POA: Diagnosis not present

## 2021-06-05 DIAGNOSIS — M1711 Unilateral primary osteoarthritis, right knee: Secondary | ICD-10-CM | POA: Diagnosis not present

## 2021-06-05 DIAGNOSIS — M25561 Pain in right knee: Secondary | ICD-10-CM | POA: Diagnosis not present

## 2021-06-05 DIAGNOSIS — I428 Other cardiomyopathies: Secondary | ICD-10-CM | POA: Insufficient documentation

## 2021-06-05 DIAGNOSIS — E854 Organ-limited amyloidosis: Secondary | ICD-10-CM | POA: Diagnosis not present

## 2021-06-05 DIAGNOSIS — I5022 Chronic systolic (congestive) heart failure: Secondary | ICD-10-CM | POA: Diagnosis present

## 2021-06-05 DIAGNOSIS — I480 Paroxysmal atrial fibrillation: Secondary | ICD-10-CM | POA: Diagnosis not present

## 2021-06-05 DIAGNOSIS — E1140 Type 2 diabetes mellitus with diabetic neuropathy, unspecified: Secondary | ICD-10-CM | POA: Insufficient documentation

## 2021-06-05 LAB — BASIC METABOLIC PANEL
Anion gap: 8 (ref 5–15)
BUN: 11 mg/dL (ref 8–23)
CO2: 25 mmol/L (ref 22–32)
Calcium: 9.1 mg/dL (ref 8.9–10.3)
Chloride: 105 mmol/L (ref 98–111)
Creatinine, Ser: 0.85 mg/dL (ref 0.44–1.00)
GFR, Estimated: >60 mL/min (ref >60)
Glucose, Bld: 157 mg/dL — ABNORMAL HIGH (ref 70–99)
Potassium: 3.8 mmol/L (ref 3.5–5.1)
Sodium: 138 mmol/L (ref 135–145)

## 2021-06-05 LAB — CBC
HCT: 34.1 % — ABNORMAL LOW (ref 36.0–46.0)
Hemoglobin: 11.8 g/dL — ABNORMAL LOW (ref 12.0–15.0)
MCH: 31.4 pg (ref 26.0–34.0)
MCHC: 34.6 g/dL (ref 30.0–36.0)
MCV: 90.7 fL (ref 80.0–100.0)
Platelets: 376 10*3/uL (ref 150–400)
RBC: 3.76 MIL/uL — ABNORMAL LOW (ref 3.87–5.11)
RDW: 12.8 % (ref 11.5–15.5)
WBC: 7.5 10*3/uL (ref 4.0–10.5)
nRBC: 0 % (ref 0.0–0.2)

## 2021-06-05 LAB — BRAIN NATRIURETIC PEPTIDE: B Natriuretic Peptide: 214.4 pg/mL — ABNORMAL HIGH (ref 0.0–100.0)

## 2021-06-05 NOTE — Progress Notes (Signed)
Patient ID: [name], female   DOB: 1950/04/06, 71 y.o.   MRN: [id number] ?  ?Advanced Heart Failure Clinic Note  ? ?PCP: Dr. Alyson Ingles ?Cardiology: Dr Aundra Dubin ? ?[name] is a 71 y.o. female with history of nonischemic cardiomyopathy.  She was admitted with CHF exacerbation in 02/2012.  EF 20-25% on echo, LHC with nonobstructive CAD.  She was started on cardiac meds and discharged.  In 07/2012, she was admitted again with CHF exacerbation.  She had run out of Lasix.  She was taking her other heart medications as ordered, however.  She was diuresed and discharged. She had a chronic LBBB, and Medtronic CRT-D device was placed in 10/14.  She was admitted in 6/16 with hypertensive emergency and CHF exacerbation.  ? ?Also of note, she had PFTs in 9/16 showing restrictive spirometry with low lung volume and DLCO.  She was supposed to get a high resolution CT to evaluate for interstitial lung disease but never had the study.   ?  ?Admitted March 2018 with urosepsis--> E Coli Bacteremia. Completed antibiotic course. EF was down from previous to 30-35%. Also had atrial fibrillation so she was loaded on amiodarone. Placed on eliquis. Discharge weight was 208 pounds.  She is now off amiodarone.  ? ?Admitted to [facility] 1/16 -> 02/13/16 with CP and dizziness. Found to be in Afib. Converted spontaneously overnight with BB and diuresis.  ? ?Echo 11/19 showed EF 35-40%.  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   ? ?Patient had COVID-19 infection in 10/20.  ? ?Echo in 11/20 showed EF 40-45% with mild LVH and mildly decreased RV systolic function.  Echo in 2/22 showed EF up to 55-60%, moderate LVH, normal RV.  ? ?Today she returns for HF follow up.  She has not been seen since last summer but says that she has been taking all her meds. Weight down 8 lbs.  She feels good, no exertional dyspnea.  Main problem is ongoing right knee pain.  She is unable to get up stairs due to  knee pain and walks with a cane. This is chronic.  ? ?Medtronic device interrogation: Stable thoracic impedance, 99% BiV pacing, no VT/VF.     ? ?ECG (personally reviewed): SR, probably BiV paced ? ?Labs (2/14): SPEP negative, UPEP negative, HIV negative ?Labs (04/14/2017): K 3.8 Creatinine 0.94  ?Labs (9/19): K 3.3, creatinine 0.86 ?Labs (1/20): K 4, creatinine 0.87 ?Labs (2/20): K 4.2, creatinine 1.16 ?Labs (8/20): K 3.8, creatinine 0.78 ?Labs (11/20): LDL 104 ?Labs (12/20): K 3.6, creatinine 0.93 ?Labs (7/21): K 3.5, creatinine 1.0 ?Labs (12/21): K 3.6, creatinine 1.05, hgbA1c 8 ?Labs (4/22): K 3.1, creatinine 1.01 ?Labs (7/22): K 4.2, creatinine 0.99 ?Labs (2/23): K 4.3, creatinine 1.01, LDL 94, HDL 47 ? ?PMH: ?1. HTN ?2. Type II diabetes with neuropathy ?3. Nonischemic cardiomyopathy: ? Due to HTN versus LBBB CMP.  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  Echo (2/14) with EF 20-25%.  Echo (7/14) with EF 25%, diffuse hypokinesis.  HIV, SPEP, UPEP negative.  Has LBBB. CRT-D 10/2012 (Medtronic).  Echo (4/15) with EF 45-50%, mild diffuse hypokinesis, PA systolic pressure 38 mmHg.  Echo (6/16) with EF 40-45%, mild LVH, septal and inferior hypokinesis.   ?- Echo (3/18): EF 30-35%. Grade 1 DD ?- Echo (6/18): EF 45-50%, moderate LVH, normal RV size with mildly decreased systolic function.  ?- Echo (11/19): EF 35-40%, moderate LVH, moderate diastolic dysfunction, normal RV size with mildly  decreased systolic function, PASP 43 mmHg.  ?- Echo (11/20): EF 40-45%, mild LVH, mildly decreased RV systolic function.  ?- Echo (2/22): EF 55-60%, moderate LVH, normal RV.  ?4. Chronic LBBB ?5. Right TKR ?6. Bilateral THR.  ?7. Hyperlipidemia ?8. ?COPD: Has oxygen for use with exertion.  ?- PFTs (9/16) with FEV1 77%, FVC 76%, ratio 101%, TLC 63%, DLCO 41% => moderate restrictive deficit  ?9. Atrial fibrillation: Paroxysmal.  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  ?11. COVID-19 infection in 10/20.  ?12. OSA: CPAP. ?13. OA: Knees ? ?SH: Prior smoker, quit 2/14.  Never drank ETOH.  No drugs. Lives with son.  ? ?FH: Mother with "heart trouble."  ? ?Review of systems complete and found to be negative unless listed in HPI.   ? ?Current Outpatient Medications  ?Medication Sig Dispense Refill  ? apixaban (ELIQUIS) 5 MG TABS tablet TAKE 1 TABLET(5 MG) BY MOUTH TWICE DAILY 180 tablet 2  ? atorvastatin (LIPITOR) 40 MG tablet Take 1 tablet by mouth once daily 90 tablet 3  ? baclofen (LIORESAL) 10 MG tablet Take 10 mg by mouth 2 (two) times daily.    ? carvedilol (COREG) 25 MG tablet TAKE ONE TABLET BY MOUTH TWICE A DAY WITH MEALS 180 tablet 2  ? cyclobenzaprine (FEXMID) 7.5 MG tablet Take 1 tablet (7.5 mg total) by mouth 2 (two) times daily as needed for muscle spasms. 20 tablet 0  ? ENTRESTO 97-103 MG TAKE ONE TABLET BY MOUTH TWICE A DAY 180 tablet 2  ? furosemide (LASIX) 20 MG tablet Take 3 tablets (60 mg total) by mouth daily. 270 tablet 3  ? gabapentin (NEURONTIN) 100 MG capsule Take 100 mg by mouth 3 (three) times daily.    ? glucose blood (FREESTYLE TEST STRIPS) test strip Use as instructed 100 each 0  ? glucose monitoring kit (FREESTYLE) monitoring kit 1 each by Does not apply route 4 (four) times daily - after meals and at bedtime. 1 month Diabetic Testing Supplies for QAC-QHS accuchecks. 1 each 1  ? HUMALOG KWIKPEN 100 UNIT/ML KwikPen Sliding scale    ? insulin glargine (LANTUS) 100 UNIT/ML Solostar Pen Inject 50 Units into the skin at bedtime.    ? Insulin Pen Needle 32G X 8 MM MISC Use as directed 100 each 0  ? isosorbide-hydrALAZINE (BIDIL) 20-37.5 MG tablet TAKE 2 TABLETS BY MOUTH 3 TIMES  DAILY ; NEEDS APPOINTMENT FOR  MORE REFILLS 540 tablet 3  ? Lancets (FREESTYLE) lancets Use as instructed 100 each 0  ? metFORMIN (GLUMETZA) 1000 MG (MOD) 24 hr tablet Take 1,000 mg by mouth in the morning and at bedtime.    ? Oxycodone HCl 10 MG TABS Take 10  mg by mouth 3 (three) times daily as needed (pain).    ? potassium chloride SA (KLOR-CON) 10 MEQ tablet Take 6 tablets (60 mEq total) by mouth daily. 540 tablet 3  ? spironolactone (ALDACTONE) 25 MG tablet TAKE 1 TABLET(25 MG) BY MOUTH DAILY 90 tablet 3  ? Tafamidis (VYNDAMAX) 61 MG CAPS TAKE 1 CAPSULE (61 MG) BY MOUTH DAILY. 90 capsule 3  ? ?No current facility-administered medications for this encounter.  ? ?BP 124/80   Pulse 86   Wt 105.5 kg (232 lb 9.6 oz)   SpO2 98%   BMI 36.43 kg/m?  ? ?Wt Readings from Last 3 Encounters:  ?06/05/21 105.5 kg (232 lb 9.6 oz)  ?01/24/21  108.9 kg (240 lb)  ?11/22/20 108.9 kg (240 lb)  ?  ?Physical exam ?General: NAD ?Neck: No JVD, no thyromegaly or thyroid nodule.  ?Lungs: Clear to auscultation bilaterally with normal respiratory effort. ?CV: Nondisplaced PMI.  Heart regular S1/S2, no S3/S4, no murmur.  No peripheral edema.  No carotid bruit.  Normal pedal pulses.  ?Abdomen: Soft, nontender, no hepatosplenomegaly, no distention.  ?Skin: Intact without lesions or rashes.  ?Neurologic: Alert and oriented x 3.  ?Psych: Normal affect. ?Extremities: No clubbing or cyanosis.  ?HEENT: Normal.  ? ?Assessment/Plan: ?1. Chronic systolic CHF: Nonischemic cardiomyopathy, thought to be related to HTN. S/P CRT-D (Medtronic). Last echo (2/22) with EF improved up to 55-60%. She is not volume overloaded by exam or Optivol. She appears to be BiV pacing appropriately.  ?- Continue Coreg 25 mg bid.  ?- Continue Entresto 97/103 mg bid. ?- Continue Bidil 2 tabs tid. ?- Continue spiro 25 mg daily. BMET/BNP today.  ?- Continue Lasix 60 mg daily. ?- I will arrange for echo to make sure that EF remains normal.  ?2. Hyperlipidemia: Continue statin.  ?3. HTN: BP now controlled.  ?4. PAF: No prolonged episodes.    ?- Continue Eliquis for anticoagulation. CBC today.  ?5. 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 Genetic testing was negative (wild type).  ?-  Continue tafamidis.  ?6. Knee osteoarthritis: She has significant limitation from right knee pain.  ?- Refer to orthopedics.  ? ?Followup in 4 months with APP.  ?  ?[name], MD  ?06/05/2021 ? ?

## 2021-06-05 NOTE — Patient Instructions (Addendum)
Good to see you today! ? ?Lab work done today,we will call you with any abnormal results ? ?Your physician has requested that you have an echocardiogram. Echocardiography is a painless test that uses sound waves to create images of your heart. It provides your doctor with information about the size and shape of your heart and how well your heart?s chambers and valves are working. This procedure takes approximately one hour. There are no restrictions for this procedure ?Your physician recommends that you schedule a follow-up appointment in: 4 months with app clinic ? ?You have been referred to [facility] . They will call to schedule an appointment ? ? ?If you have any questions or concerns before your next appointment please send [place] a message through [place] or call our office at [phone].   ? ?TO LEAVE A MESSAGE FOR THE NURSE SELECT OPTION 2, PLEASE LEAVE A MESSAGE INCLUDING: ?YOUR NAME ?DATE OF BIRTH ?CALL BACK NUMBER ?REASON FOR CALL**this is important as we prioritize the call backs ? ?YOU WILL RECEIVE A CALL BACK THE SAME DAY AS LONG AS YOU CALL BEFORE 4:00 PM ? ?If you have any questions or concerns before your next appointment please send [place] a message through [place] or call our office at [phone].   ? ?TO LEAVE A MESSAGE FOR THE NURSE SELECT OPTION 2, PLEASE LEAVE A MESSAGE INCLUDING: ?YOUR NAME ?DATE OF BIRTH ?CALL BACK NUMBER ?REASON FOR CALL**this is important as we prioritize the call backs ? ?YOU WILL RECEIVE A CALL BACK THE SAME DAY AS LONG AS YOU CALL BEFORE 4:00 PM ? ? ?

## 2021-06-07 NOTE — Progress Notes (Signed)
Remote ICD transmission.   

## 2021-06-13 ENCOUNTER — Ambulatory Visit (HOSPITAL_COMMUNITY)
Admission: RE | Admit: 2021-06-13 | Discharge: 2021-06-13 | Disposition: A | Discharge status: Home / Self Care | Payer: Medicare Other | Source: Ambulatory Visit | Attending: Cardiology | Admitting: Cardiology

## 2021-06-13 DIAGNOSIS — I509 Heart failure, unspecified: Secondary | ICD-10-CM | POA: Diagnosis present

## 2021-06-13 DIAGNOSIS — I4891 Unspecified atrial fibrillation: Secondary | ICD-10-CM | POA: Insufficient documentation

## 2021-06-13 DIAGNOSIS — I110 Hypertensive heart disease with heart failure: Secondary | ICD-10-CM | POA: Insufficient documentation

## 2021-06-13 DIAGNOSIS — G4730 Sleep apnea, unspecified: Secondary | ICD-10-CM | POA: Insufficient documentation

## 2021-06-13 DIAGNOSIS — I5022 Chronic systolic (congestive) heart failure: Secondary | ICD-10-CM | POA: Diagnosis not present

## 2021-06-13 DIAGNOSIS — I2510 Atherosclerotic heart disease of native coronary artery without angina pectoris: Secondary | ICD-10-CM | POA: Insufficient documentation

## 2021-06-13 DIAGNOSIS — E119 Type 2 diabetes mellitus without complications: Secondary | ICD-10-CM | POA: Insufficient documentation

## 2021-06-13 LAB — ECHOCARDIOGRAM COMPLETE
AR max vel: 2.2 cm2
AV Peak grad: 6.6 mmHg
Ao pk vel: 1.28 m/s
Area-P 1/2: 4.74 cm2
Calc EF: 39.3 %
S' Lateral: 4.5 cm
Single Plane A2C EF: 40.5 %
Single Plane A4C EF: 38.7 %

## 2021-06-14 ENCOUNTER — Other Ambulatory Visit (HOSPITAL_COMMUNITY): Payer: Self-pay

## 2021-06-14 ENCOUNTER — Emergency Department (HOSPITAL_COMMUNITY): Payer: Medicare Other

## 2021-06-14 ENCOUNTER — Emergency Department (HOSPITAL_COMMUNITY)
Admission: EM | Admit: 2021-06-14 | Discharge: 2021-06-14 | Disposition: A | Discharge status: Home / Self Care | Payer: Medicare Other | Attending: Emergency Medicine | Admitting: Emergency Medicine

## 2021-06-14 DIAGNOSIS — M25562 Pain in left knee: Secondary | ICD-10-CM | POA: Diagnosis present

## 2021-06-14 DIAGNOSIS — Z794 Long term (current) use of insulin: Secondary | ICD-10-CM | POA: Diagnosis not present

## 2021-06-14 DIAGNOSIS — G8929 Other chronic pain: Secondary | ICD-10-CM | POA: Diagnosis not present

## 2021-06-14 DIAGNOSIS — I110 Hypertensive heart disease with heart failure: Secondary | ICD-10-CM | POA: Diagnosis not present

## 2021-06-14 DIAGNOSIS — I509 Heart failure, unspecified: Secondary | ICD-10-CM | POA: Insufficient documentation

## 2021-06-14 DIAGNOSIS — Z79899 Other long term (current) drug therapy: Secondary | ICD-10-CM | POA: Insufficient documentation

## 2021-06-14 DIAGNOSIS — E119 Type 2 diabetes mellitus without complications: Secondary | ICD-10-CM | POA: Insufficient documentation

## 2021-06-14 DIAGNOSIS — Z7984 Long term (current) use of oral hypoglycemic drugs: Secondary | ICD-10-CM | POA: Diagnosis not present

## 2021-06-14 DIAGNOSIS — Z7901 Long term (current) use of anticoagulants: Secondary | ICD-10-CM | POA: Insufficient documentation

## 2021-06-14 MED ORDER — OXYCODONE HCL 5 MG PO TABS: 5.0000 mg | ORAL_TABLET | ORAL | 0 refills | Status: DC | PRN

## 2021-06-14 MED ORDER — OXYCODONE-ACETAMINOPHEN 5-325 MG PO TABS
1.0000 | ORAL_TABLET | Freq: Once | ORAL | Status: AC
  Administered 2021-06-14: 1 via ORAL
  Filled 2021-06-14: qty 1

## 2021-06-14 NOTE — Discharge Instructions (Signed)
As we discussed, your work-up in the ER today was reassuring for acute abnormalities.  X-ray imaging of your left knee did not show any signs of fractures or dislocations.  It did show osteoarthritis which I suspect is what is causing your pain.  I have given you a prescription for narcotic pain medication for you to take as prescribed as needed for severe pain.  Please do not drive or operate heavy machinery after taking this medication.  Please discuss further management of your symptoms with your orthopedist at your appointment on 5/23.  I have also given you a brace to help support your knee, please wear this when you are active on your knee for additional support.  Return if development of any new or worsening symptoms.

## 2021-06-14 NOTE — ED Provider Notes (Signed)
  Face-to-face evaluation   History: She presents for evaluation of left knee pain, ongoing and worsening.  Recently she saw a neurologist who referred her back to her orthopedist.  She has previously had a right knee replacement  Physical exam: Overweight female.  Left knee tender and swollen with effusion.  She guards against movement of the left knee secondary to pain.  MDM: Evaluation for  Chief Complaint  Patient presents with   Knee Pain     Atraumatic left knee pain consistent with osteoarthritis and due to swelling and pain.  No evidence for fracture or septic joint.  Patient treated symptomatically will be discharged to follow-up with her orthopedist as scheduled.  Medical screening examination/treatment/procedure(s) were conducted as a shared visit with non-physician practitioner(s) and myself.  I personally evaluated the patient during the encounter    Daleen Bo, MD 06/15/21 1041

## 2021-06-14 NOTE — ED Notes (Signed)
Ortho called for knee brace.

## 2021-06-14 NOTE — ED Provider Triage Note (Signed)
Emergency Medicine Provider Triage Evaluation Note  [name] , a 71 y.o. female  was evaluated in triage.  Pt complains of left-sided knee pain.  Patient reports left knee is hurt for "months" and she has been told by multiple doctors in the past that she has arthritis.  Patient scheduled to see orthopedics on 5/23.  Patient reports the pain is a 10 out of 10.  Patient denies any fevers, body aches or chills.  Review of Systems  Positive:  Negative:   Physical Exam  BP (!) 171/100 (BP Location: Right Arm)   Pulse (!) 105   Temp 98.2 F (36.8 C) (Oral)   Resp 20   Ht '5\' 7"'$  (1.702 m)   Wt 108.9 kg   SpO2 100%   BMI 37.59 kg/m  Gen:   Awake, no distress   Resp:  Normal effort  MSK:   Moves extremities without difficulty  Other:  No obvious effusion, patient has appropriate range of motion.  Medical Decision Making  Medically screening exam initiated at 7:41 PM.  Appropriate orders placed.  JAYEL SCADUTO was informed that the remainder of the evaluation will be completed by another provider, this initial triage assessment does not replace that evaluation, and the importance of remaining in the ED until their evaluation is complete.     Azucena Cecil, PA-C 06/14/21 1941

## 2021-06-14 NOTE — ED Provider Notes (Signed)
[facility] DEPT Provider Note   CSN: [id number] Arrival date & time: 06/14/21  1856     History  Chief Complaint  Patient presents with   Knee Pain    [name] is a 71 y.o. female.  Patient with history of type 2 diabetes, hypertension, high cholesterol, chronic low back pain, and CHF presents today with complaints of left knee pain.  She states that same has been hurting for over 6 months.  She has been seen by her neurosurgeon and her primary care doctor and mentioned same and was told that her symptoms were likely due to arthritis.  She was subsequently referred to orthopedics and has an appointment with them on 5/23.  States that she did fall on her left knee 1 week ago which has made her pain worse.  She denies any other associated injuries from her fall.  States that she has been able to walk but with pain.  She states that she was on oxycodone for management of her pain but she ran out.  States that her pain is too severe to wait until her orthopedic appointment for additional pain control.  She denies any fevers, chills, nausea, vomiting.  The history is provided by the patient. No language interpreter was used.  Knee Pain     Home Medications Prior to Admission medications   Medication Sig Start Date End Date Taking? Authorizing Provider  apixaban (ELIQUIS) 5 MG TABS tablet TAKE 1 TABLET(5 MG) BY MOUTH TWICE DAILY 08/31/20   Larey Dresser, MD  atorvastatin (LIPITOR) 40 MG tablet Take 1 tablet by mouth once daily 02/14/21   Larey Dresser, MD  baclofen (LIORESAL) 10 MG tablet Take 10 mg by mouth 2 (two) times daily. 02/08/18   [provider]  carvedilol (COREG) 25 MG tablet TAKE ONE TABLET BY MOUTH TWICE A DAY WITH MEALS 01/03/21   Larey Dresser, MD  cyclobenzaprine (FEXMID) 7.5 MG tablet Take 1 tablet (7.5 mg total) by mouth 2 (two) times daily as needed for muscle spasms. 04/05/21   Azucena Cecil, PA-C  ENTRESTO 97-103  MG TAKE ONE TABLET BY MOUTH TWICE A DAY 01/03/21   Larey Dresser, MD  furosemide (LASIX) 20 MG tablet Take 3 tablets (60 mg total) by mouth daily. 08/15/20   Milford, Maricela Bo, FNP  gabapentin (NEURONTIN) 100 MG capsule Take 100 mg by mouth 3 (three) times daily. 03/10/18   [provider]  glucose blood (FREESTYLE TEST STRIPS) test strip Use as instructed 04/25/16   Ghimire, Henreitta Leber, MD  glucose monitoring kit (FREESTYLE) monitoring kit 1 each by Does not apply route 4 (four) times daily - after meals and at bedtime. 1 month Diabetic Testing Supplies for QAC-QHS accuchecks. 04/25/16   Ghimire, Henreitta Leber, MD  HUMALOG KWIKPEN 100 UNIT/ML KwikPen Sliding scale 02/12/18   [provider]  insulin glargine (LANTUS) 100 UNIT/ML Solostar Pen Inject 50 Units into the skin at bedtime.    [provider]  Insulin Pen Needle 32G X 8 MM MISC Use as directed 06/18/16   Arbutus Leas, NP  isosorbide-hydrALAZINE (BIDIL) 20-37.5 MG tablet TAKE 2 TABLETS BY MOUTH 3 TIMES  DAILY ; NEEDS APPOINTMENT FOR  MORE REFILLS 05/20/21   Larey Dresser, MD  Lancets (FREESTYLE) lancets Use as instructed 06/18/16   Arbutus Leas, NP  metFORMIN (GLUMETZA) 1000 MG (MOD) 24 hr tablet Take 1,000 mg by mouth in the morning and at bedtime.  [provider]  Oxycodone HCl 10 MG TABS Take 10 mg by mouth 3 (three) times daily as needed (pain). 01/09/20   [provider]  potassium chloride SA (KLOR-CON) 10 MEQ tablet Take 6 tablets (60 mEq total) by mouth daily. 08/15/20 06/06/22  Rafael Bihari, FNP  spironolactone (ALDACTONE) 25 MG tablet TAKE 1 TABLET(25 MG) BY MOUTH DAILY 08/15/20   Milford, [place], FNP  Tafamidis (VYNDAMAX) 61 MG CAPS TAKE 1 CAPSULE (61 MG) BY MOUTH DAILY. 10/11/20 10/11/21  Larey Dresser, MD      Allergies    Morphine and related and Penicillins    Review of Systems   Review of Systems  Musculoskeletal:  Positive for arthralgias and myalgias.  All other  systems reviewed and are negative.  Physical Exam Updated Vital Signs BP (!) 171/100 (BP Location: Right Arm)   Pulse (!) 105   Temp 98.2 F (36.8 C) (Oral)   Resp 20   Ht '5\' 7"'  (1.702 m)   Wt 108.9 kg   SpO2 100%   BMI 37.59 kg/m  Physical Exam Vitals and nursing note reviewed.  Constitutional:      General: She is not in acute distress.    Appearance: Normal appearance. She is normal weight. She is not ill-appearing, toxic-appearing or diaphoretic.  HENT:     Head: Normocephalic and atraumatic.  Cardiovascular:     Rate and Rhythm: Normal rate.  Pulmonary:     Effort: Pulmonary effort is normal. No respiratory distress.  Musculoskeletal:        General: Normal range of motion.     Cervical back: Normal range of motion.     Comments: Full ROM intact to the left knee with some pain.  DP and PT pulses intact and 2+.  No obvious swelling or deformity.  No erythema or warmth or overlying skin changes.  Skin:    General: Skin is warm and dry.  Neurological:     General: No focal deficit present.     Mental Status: She is alert.  Psychiatric:        Mood and Affect: Mood normal.        Behavior: Behavior normal.    ED Results / Procedures / Treatments   Labs (all labs ordered are listed, but only abnormal results are displayed) Labs Reviewed - No data to display  EKG None  Radiology DG Knee Complete 4 Views Left  Result Date: 06/14/2021 CLINICAL DATA:  Pain. EXAM: LEFT KNEE - COMPLETE 4+ VIEW COMPARISON:  None Available. FINDINGS: Suprapatellar joint effusion is present. The bones are osteopenic. There is no acute fracture or dislocation. There is tricompartmental osteophyte formation and spurring of the tibial spines compatible with degenerative change. IMPRESSION: 1. Joint effusion. 2. Moderate tricompartmental osteoarthrosis. 3. No acute fracture or dislocation. Electronically Signed   By: Ronney Asters M.D.   On: 06/14/2021 20:16   ECHOCARDIOGRAM COMPLETE  Result  Date: 06/13/2021    ECHOCARDIOGRAM REPORT   Patient Name:   [name] Date of Exam: 06/13/2021 Medical Rec #:  [id number]      Height:       67.0 in Accession #:    [id number]     Weight:       232.6 lb Date of Birth:  Sep 01, 1950      BSA:          2.156 m Patient Age:    25 years       BP:  124/80 mmHg Patient Gender: F              HR:           86 bpm. Exam Location:  Outpatient Procedure: 2D Echo, Cardiac Doppler, Color Doppler and Strain Analysis Indications:    CHF  History:        Patient has prior history of Echocardiogram examinations. CHF,                 CAD, Arrythmias:Atrial Fibrillation; Risk Factors:Hypertension,                 Sleep Apnea and Diabetes.  Sonographer:    Jyl Heinz Referring Phys: [place]  1. Hypokinesis of the inferolateral wall with overall mild LV dysfunction.  2. Left ventricular ejection fraction, by estimation, is 45 to 50%. The left ventricle has mildly decreased function. The left ventricle demonstrates regional wall motion abnormalities (see scoring diagram/findings for description). There is severe left  ventricular hypertrophy. Left ventricular diastolic parameters are consistent with Grade II diastolic dysfunction (pseudonormalization). Elevated left atrial pressure. The average left ventricular global longitudinal strain is -12.1 %. The global longitudinal strain is abnormal.  3. Right ventricular systolic function is normal. The right ventricular size is normal.  4. Left atrial size was mildly dilated.  5. Right atrial size was mildly dilated.  6. The mitral valve is normal in structure. Trivial mitral valve regurgitation. No evidence of mitral stenosis.  7. The aortic valve is tricuspid. Aortic valve regurgitation is not visualized. No aortic stenosis is present.  8. The inferior vena cava is dilated in size with >50% respiratory variability, suggesting right atrial pressure of 8 mmHg. FINDINGS  Left Ventricle: Left ventricular  ejection fraction, by estimation, is 45 to 50%. The left ventricle has mildly decreased function. The left ventricle demonstrates regional wall motion abnormalities. The average left ventricular global longitudinal strain is -12.1 %. The global longitudinal strain is abnormal. The left ventricular internal cavity size was normal in size. There is severe left ventricular hypertrophy. Left ventricular diastolic parameters are consistent with Grade II diastolic dysfunction (pseudonormalization). Elevated left atrial pressure. Right Ventricle: The right ventricular size is normal. Right ventricular systolic function is normal. Left Atrium: Left atrial size was mildly dilated. Right Atrium: Right atrial size was mildly dilated. Pericardium: There is no evidence of pericardial effusion. Mitral Valve: The mitral valve is normal in structure. Trivial mitral valve regurgitation. No evidence of mitral valve stenosis. Tricuspid Valve: The tricuspid valve is normal in structure. Tricuspid valve regurgitation is mild . No evidence of tricuspid stenosis. Aortic Valve: The aortic valve is tricuspid. Aortic valve regurgitation is not visualized. No aortic stenosis is present. Aortic valve peak gradient measures 6.6 mmHg. Pulmonic Valve: The pulmonic valve was normal in structure. Pulmonic valve regurgitation is trivial. No evidence of pulmonic stenosis. Aorta: The aortic root is normal in size and structure. Venous: The inferior vena cava is dilated in size with greater than 50% respiratory variability, suggesting right atrial pressure of 8 mmHg. IAS/Shunts: No atrial level shunt detected by color flow Doppler. Additional Comments: Hypokinesis of the inferolateral wall with overall mild LV dysfunction. A device lead is visualized.  LEFT VENTRICLE PLAX 2D LVIDd:         5.20 cm      Diastology LVIDs:         4.50 cm      LV e' medial:    4.88 cm/s LV PW:  1.40 cm      LV E/e' medial:  24.4 LV IVS:        1.20 cm      LV e'  lateral:   6.45 cm/s LVOT diam:     2.10 cm      LV E/e' lateral: 18.4 LV SV:         63 LV SV Index:   29           2D Longitudinal Strain LVOT Area:     3.46 cm     2D Strain GLS Avg:     -12.1 %  LV Volumes (MOD) LV vol d, MOD A2C: 167.0 ml 3D Volume EF: LV vol d, MOD A4C: 145.0 ml 3D EF:        44 % LV vol s, MOD A2C: 99.4 ml  LV EDV:       216 ml LV vol s, MOD A4C: 88.9 ml  LV ESV:       120 ml LV SV MOD A2C:     67.6 ml  LV SV:        96 ml LV SV MOD A4C:     145.0 ml LV SV MOD BP:      63.0 ml RIGHT VENTRICLE            IVC RV Basal diam:  3.90 cm    IVC diam: 2.30 cm RV Mid diam:    3.30 cm RV S prime:     8.95 cm/s TAPSE (M-mode): 1.8 cm LEFT ATRIUM             Index        RIGHT ATRIUM           Index LA diam:        4.40 cm 2.04 cm/m   RA Area:     22.70 cm LA Vol (A2C):   96.2 ml 44.61 ml/m  RA Volume:   74.70 ml  34.64 ml/m LA Vol (A4C):   49.5 ml 22.96 ml/m LA Biplane Vol: 72.8 ml 33.76 ml/m  AORTIC VALVE AV Area (Vmax): 2.20 cm AV Vmax:        128.00 cm/s AV Peak Grad:   6.6 mmHg LVOT Vmax:      81.30 cm/s LVOT Vmean:     58.300 cm/s LVOT VTI:       0.182 m  AORTA Ao Root diam: 2.80 cm Ao Asc diam:  3.10 cm MITRAL VALVE                TRICUSPID VALVE MV Area (PHT): 4.74 cm     TR Peak grad:   32.0 mmHg MV Decel Time: 160 msec     TR Vmax:        283.00 cm/s MV E velocity: 119.00 cm/s MV A velocity: 81.50 cm/s   SHUNTS MV E/A ratio:  1.46         Systemic VTI:  0.18 m                             Systemic Diam: 2.10 cm Kirk Ruths MD Electronically signed by Kirk Ruths MD Signature Date/Time: 06/13/2021/12:50:03 PM    Final     Procedures Procedures    Medications Ordered in ED Medications  oxyCODONE-acetaminophen (PERCOCET/ROXICET) 5-325 MG per tablet 1 tablet (has no administration in time range)    ED Course/ Medical Decision Making/ A&P  Medical Decision Making Risk Prescription drug management.   Patient presents today with several months  of left knee pain.  She is afebrile, nontoxic-appearing, and in no acute distress with reassuring vital signs.  Patient left knee X-Ray negative for obvious fracture or dislocation.  Does show a joint effusion with moderate tricompartmental osteoarthritis.  I have personally reviewed and evaluated this imaging and agree with radiology interpretation.  No concern for septic arthritis.  Pain managed in ED. Pt advised to follow up with orthopedics at her appointment on 5/23 for further evaluation and management of her pain. Patient given brace while in ED, conservative therapy recommended and discussed.  We will also give patient a short prescription for oxycodone as she states that this was giving her significant relief before she ran out of it.  I have personally reviewed PDMP and deemed patient appropriate candidate for short course of prescription narcotics.  Patient will be dc home & is agreeable with above plan.  Educated on red flag symptoms of prompt immediate return.  Discharged in stable condition.   This is a shared visit with supervising physician Dr. Eulis Foster who has independently evaluated patient & provided guidance in evaluation/management/disposition, in agreement with care   Final Clinical Impression(s) / ED Diagnoses Final diagnoses:  Chronic pain of left knee    Rx / DC Orders ED Discharge Orders          Ordered    oxyCODONE (ROXICODONE) 5 MG immediate release tablet  Every 4 hours PRN        06/14/21 2101          An After Visit Summary was printed and given to the patient.     [name] 06/14/21 2102    Daleen Bo, MD 06/15/21 Sheila Oats    Daleen Bo, MD 06/15/21 1041

## 2021-06-14 NOTE — ED Triage Notes (Signed)
Pt reports R sided knee pain. Fall 2 weeks ago but reports chronic knee pain. Pain worse when straightening her knee. She takes tylenol. She had been taking oxy for pain.

## 2021-06-17 ENCOUNTER — Other Ambulatory Visit (HOSPITAL_COMMUNITY): Payer: Self-pay

## 2021-06-18 ENCOUNTER — Other Ambulatory Visit (HOSPITAL_COMMUNITY): Payer: Self-pay

## 2021-06-19 ENCOUNTER — Ambulatory Visit: Payer: Medicare Other | Admitting: Orthopedic Surgery

## 2021-06-26 ENCOUNTER — Ambulatory Visit: Payer: Medicare Other | Admitting: Podiatry

## 2021-07-03 ENCOUNTER — Ambulatory Visit: Payer: Medicare Other | Admitting: Podiatry

## 2021-07-09 ENCOUNTER — Other Ambulatory Visit (HOSPITAL_COMMUNITY): Payer: Self-pay

## 2021-07-29 ENCOUNTER — Other Ambulatory Visit (HOSPITAL_COMMUNITY): Payer: Self-pay

## 2021-08-08 ENCOUNTER — Telehealth: Payer: Self-pay

## 2021-08-08 NOTE — Telephone Encounter (Signed)
   Pre-operative Risk Assessment    Patient Name: [name]  DOB: 03-14-1950 MRN: [id number]      Request for Surgical Clearance    Procedure:   Left Knee Arthropasty  Date of Surgery:  Clearance TBD                                 Surgeon:  Frederik Pear, MD Surgeon's Group or Practice Name:  [facility] Phone number:  [phone] Fax number:  [phone]   Type of Clearance Requested:   - Medical  - Pharmacy:  Hold Apixaban (Eliquis)     Type of Anesthesia:  Spinal   Additional requests/questions:   None  Signed, Tyler Cubit   08/08/2021, 11:21 AM

## 2021-08-09 NOTE — Telephone Encounter (Signed)
Patient with diagnosis of afib on Eliquis for anticoagulation.    Procedure: left knee arthroplasty Date of procedure: TBD  CHA2DS2-VASc Score = 6  This indicates a 9.7% annual risk of stroke. The patient's score is based upon: CHF History: 1 HTN History: 1 Diabetes History: 1 Stroke History: 0 Vascular Disease History: 1 Age Score: 1 Gender Score: 1   CrCl 31m/min using adjusted body weight due to obesity Platelet count 376K  Per office protocol, patient can hold Eliquis for 3 days prior to procedure.    **This guidance is not considered finalized until pre-operative APP has relayed final recommendations.**

## 2021-08-12 NOTE — Telephone Encounter (Signed)
[place] for surgery.  Can hold Eliquis as recommended by pharmacy.

## 2021-08-13 NOTE — Telephone Encounter (Signed)
Per Dr. Aundra Dubin " [facility] for surgery.  Can hold Eliquis as recommended by pharmacy.     Patient can hold Eliquis 3 days prior to the procedure.

## 2021-08-22 ENCOUNTER — Other Ambulatory Visit (HOSPITAL_COMMUNITY): Payer: Self-pay

## 2021-08-22 ENCOUNTER — Ambulatory Visit (INDEPENDENT_AMBULATORY_CARE_PROVIDER_SITE_OTHER): Payer: Medicare Other

## 2021-08-22 DIAGNOSIS — I428 Other cardiomyopathies: Secondary | ICD-10-CM | POA: Diagnosis not present

## 2021-08-22 LAB — CUP PACEART REMOTE DEVICE CHECK
Battery Remaining Longevity: 96 mo
Battery Voltage: 2.99 V
Brady Statistic AP VP Percent: 0.07 %
Brady Statistic AP VS Percent: 0.01 %
Brady Statistic AS VP Percent: 98.57 %
Brady Statistic AS VS Percent: 1.36 %
Brady Statistic RA Percent Paced: 0.07 %
Brady Statistic RV Percent Paced: 36 %
Date Time Interrogation Session: 20230726172556
HighPow Impedance: 66 Ohm
Implantable Lead Implant Date: 20141023
Implantable Lead Implant Date: 20141023
Implantable Lead Implant Date: 20141023
Implantable Lead Location: 753858
Implantable Lead Location: 753859
Implantable Lead Location: 753860
Implantable Lead Model: 4298
Implantable Lead Model: 5076
Implantable Lead Model: 6935
Implantable Pulse Generator Implant Date: 20221027
Lead Channel Impedance Value: 171 Ohm
Lead Channel Impedance Value: 175.622
Lead Channel Impedance Value: 175.622
Lead Channel Impedance Value: 188.1 Ohm
Lead Channel Impedance Value: 193.707
Lead Channel Impedance Value: 342 Ohm
Lead Channel Impedance Value: 342 Ohm
Lead Channel Impedance Value: 361 Ohm
Lead Channel Impedance Value: 399 Ohm
Lead Channel Impedance Value: 418 Ohm
Lead Channel Impedance Value: 475 Ohm
Lead Channel Impedance Value: 475 Ohm
Lead Channel Impedance Value: 551 Ohm
Lead Channel Impedance Value: 608 Ohm
Lead Channel Impedance Value: 646 Ohm
Lead Channel Impedance Value: 646 Ohm
Lead Channel Impedance Value: 665 Ohm
Lead Channel Impedance Value: 665 Ohm
Lead Channel Pacing Threshold Amplitude: 0.5 V
Lead Channel Pacing Threshold Amplitude: 0.5 V
Lead Channel Pacing Threshold Amplitude: 1.375 V
Lead Channel Pacing Threshold Pulse Width: 0.4 ms
Lead Channel Pacing Threshold Pulse Width: 0.4 ms
Lead Channel Pacing Threshold Pulse Width: 0.4 ms
Lead Channel Sensing Intrinsic Amplitude: 17.75 mV
Lead Channel Sensing Intrinsic Amplitude: 17.75 mV
Lead Channel Sensing Intrinsic Amplitude: 3.625 mV
Lead Channel Sensing Intrinsic Amplitude: 3.625 mV
Lead Channel Setting Pacing Amplitude: 1.5 V
Lead Channel Setting Pacing Amplitude: 2.25 V
Lead Channel Setting Pacing Amplitude: 2.5 V
Lead Channel Setting Pacing Pulse Width: 0.4 ms
Lead Channel Setting Pacing Pulse Width: 0.4 ms
Lead Channel Setting Sensing Sensitivity: 0.45 mV

## 2021-08-29 ENCOUNTER — Other Ambulatory Visit (HOSPITAL_COMMUNITY): Payer: Self-pay

## 2021-09-06 ENCOUNTER — Encounter (HOSPITAL_COMMUNITY): Payer: Self-pay | Admitting: *Deleted

## 2021-09-06 ENCOUNTER — Emergency Department (HOSPITAL_COMMUNITY)
Admission: EM | Admit: 2021-09-06 | Discharge: 2021-09-06 | Disposition: A | Discharge status: Home / Self Care | Payer: Medicare Other | Attending: Emergency Medicine | Admitting: Emergency Medicine

## 2021-09-06 ENCOUNTER — Other Ambulatory Visit: Payer: Self-pay

## 2021-09-06 ENCOUNTER — Emergency Department (HOSPITAL_COMMUNITY): Payer: Medicare Other

## 2021-09-06 DIAGNOSIS — R5383 Other fatigue: Secondary | ICD-10-CM | POA: Diagnosis not present

## 2021-09-06 DIAGNOSIS — R079 Chest pain, unspecified: Secondary | ICD-10-CM

## 2021-09-06 DIAGNOSIS — I509 Heart failure, unspecified: Secondary | ICD-10-CM | POA: Diagnosis not present

## 2021-09-06 DIAGNOSIS — I5022 Chronic systolic (congestive) heart failure: Secondary | ICD-10-CM

## 2021-09-06 DIAGNOSIS — Z794 Long term (current) use of insulin: Secondary | ICD-10-CM | POA: Diagnosis not present

## 2021-09-06 DIAGNOSIS — R072 Precordial pain: Secondary | ICD-10-CM | POA: Insufficient documentation

## 2021-09-06 DIAGNOSIS — R7989 Other specified abnormal findings of blood chemistry: Secondary | ICD-10-CM | POA: Insufficient documentation

## 2021-09-06 DIAGNOSIS — Z79899 Other long term (current) drug therapy: Secondary | ICD-10-CM | POA: Insufficient documentation

## 2021-09-06 DIAGNOSIS — R002 Palpitations: Secondary | ICD-10-CM | POA: Insufficient documentation

## 2021-09-06 DIAGNOSIS — E119 Type 2 diabetes mellitus without complications: Secondary | ICD-10-CM | POA: Diagnosis not present

## 2021-09-06 DIAGNOSIS — I2510 Atherosclerotic heart disease of native coronary artery without angina pectoris: Secondary | ICD-10-CM | POA: Insufficient documentation

## 2021-09-06 DIAGNOSIS — Z95810 Presence of automatic (implantable) cardiac defibrillator: Secondary | ICD-10-CM | POA: Diagnosis not present

## 2021-09-06 DIAGNOSIS — Z7901 Long term (current) use of anticoagulants: Secondary | ICD-10-CM | POA: Diagnosis not present

## 2021-09-06 DIAGNOSIS — R110 Nausea: Secondary | ICD-10-CM | POA: Diagnosis not present

## 2021-09-06 LAB — CBC WITH DIFFERENTIAL/PLATELET
Abs Immature Granulocytes: 0.02 10*3/uL (ref 0.00–0.07)
Basophils Absolute: 0.1 10*3/uL (ref 0.0–0.1)
Basophils Relative: 1 %
Eosinophils Absolute: 0.3 10*3/uL (ref 0.0–0.5)
Eosinophils Relative: 3 %
HCT: 34.6 % — ABNORMAL LOW (ref 36.0–46.0)
Hemoglobin: 12 g/dL (ref 12.0–15.0)
Immature Granulocytes: 0 %
Lymphocytes Relative: 17 %
Lymphs Abs: 1.4 10*3/uL (ref 0.7–4.0)
MCH: 30.6 pg (ref 26.0–34.0)
MCHC: 34.7 g/dL (ref 30.0–36.0)
MCV: 88.3 fL (ref 80.0–100.0)
Monocytes Absolute: 0.5 10*3/uL (ref 0.1–1.0)
Monocytes Relative: 6 %
Neutro Abs: 5.9 10*3/uL (ref 1.7–7.7)
Neutrophils Relative %: 73 %
Platelets: 340 10*3/uL (ref 150–400)
RBC: 3.92 MIL/uL (ref 3.87–5.11)
RDW: 12.1 % (ref 11.5–15.5)
WBC: 8.2 10*3/uL (ref 4.0–10.5)
nRBC: 0 % (ref 0.0–0.2)

## 2021-09-06 LAB — COMPREHENSIVE METABOLIC PANEL
ALT: 11 U/L (ref 0–44)
AST: 14 U/L — ABNORMAL LOW (ref 15–41)
Albumin: 3.6 g/dL (ref 3.5–5.0)
Alkaline Phosphatase: 89 U/L (ref 38–126)
Anion gap: 7 (ref 5–15)
BUN: 11 mg/dL (ref 8–23)
CO2: 25 mmol/L (ref 22–32)
Calcium: 8.9 mg/dL (ref 8.9–10.3)
Chloride: 101 mmol/L (ref 98–111)
Creatinine, Ser: 0.9 mg/dL (ref 0.44–1.00)
GFR, Estimated: >60 mL/min (ref >60)
Glucose, Bld: 246 mg/dL — ABNORMAL HIGH (ref 70–99)
Potassium: 3.8 mmol/L (ref 3.5–5.1)
Sodium: 133 mmol/L — ABNORMAL LOW (ref 135–145)
Total Bilirubin: 0.6 mg/dL (ref 0.3–1.2)
Total Protein: 6.9 g/dL (ref 6.5–8.1)

## 2021-09-06 LAB — TROPONIN I (HIGH SENSITIVITY)
Troponin I (High Sensitivity): 13 ng/L (ref <18)
Troponin I (High Sensitivity): 11 ng/L (ref <18)

## 2021-09-06 LAB — BRAIN NATRIURETIC PEPTIDE: B Natriuretic Peptide: 415.8 pg/mL — ABNORMAL HIGH (ref 0.0–100.0)

## 2021-09-06 LAB — LIPASE, BLOOD: Lipase: 41 U/L (ref 11–51)

## 2021-09-06 MED ORDER — POTASSIUM CHLORIDE CRYS ER 20 MEQ PO TBCR
60.0000 meq | EXTENDED_RELEASE_TABLET | Freq: Once | ORAL | Status: AC
  Administered 2021-09-06: 60 meq via ORAL
  Filled 2021-09-06: qty 3

## 2021-09-06 MED ORDER — FUROSEMIDE 10 MG/ML IJ SOLN
80.0000 mg | Freq: Once | INTRAMUSCULAR | Status: AC
  Administered 2021-09-06: 80 mg via INTRAVENOUS
  Filled 2021-09-06: qty 8

## 2021-09-06 NOTE — ED Notes (Signed)
[employer] called with report:  preliminary: 3 runs of VT in July, none recent, and none of them sustained, device never treated pt, acting as expected, 293 sensing episodes, last sensing episode 8/9

## 2021-09-06 NOTE — ED Notes (Signed)
EDP at [facility], pt alert, NAD, calm, interactive, resps e/u, speaking in clear complete sentences. Denies pain or nausea.

## 2021-09-06 NOTE — Consult Note (Addendum)
Advanced Heart Failure Team Consult Note   Primary Physician: [facility], [place] PCP-Cardiologist:  [name], MD  Reason for Consultation: Acute on chronic diastolic heart failure  HPI:    JONI COLEGROVE is seen today for evaluation of acute on chronic diastolic heart failure at the request of Dr. Sherry Ruffing w/ emergency medicine.   LIANDRA MENDIA is a 71 y.o. female with history of NICM, CHF w/ ICD, CAD, chronic LBBB, HLP, a. Fib on eliquis, COPD, TTA-wild-type, ILD.  She was admitted w/ CHF exacerbation in 02/2012.  EF 20-25% on echo, LHC with nonobstructive CAD.  She was started on cardiac meds and discharged. 07/2012, was admitted again with CHF exacerbation.  She had run out of Lasix. She was diuresed and discharged. She had a chronic LBBB, and Medtronic CRT-D device was placed in 10/14.  She was admitted in 6/16 with hypertensive emergency and CHF exacerbation.    Also of note, she had PFTs in 9/16 showing restrictive spirometry with low lung volume and DLCO.  She was supposed to get a high resolution CT to evaluate for interstitial lung disease but never had the study.     Admitted March 2018 with urosepsis--> E Coli Bacteremia. Completed antibiotic course. EF was down from previous to 30-35%. Also had atrial fibrillation so she was loaded on amiodarone. Placed on eliquis. Discharge weight was 208 pounds.  She is now off amiodarone.    Admitted to [facility] 1/16 -> 02/13/16 with CP and dizziness. Found to be in Afib. Converted spontaneously overnight with BB and diuresis.    Echo 11/19 showed EF 35-40%.  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     Patient had COVID-19 infection in 10/20.    Echo in 11/20 showed EF 40-45% with mild LVH and mildly decreased RV systolic function. Echo in 2/22 showed EF up to 55-60%, moderate LVH, normal RV.   Presented to the ED for new onset chest pressure that began at 0300. Received 354m  ASA, ntg x2, and zofran. On arrival CP had resolved. ICD interrogated and showed: 3 runs of VT in July, none recent, and none of them sustained, device never treated pt, acting as expected, 293 sensing episodes, last sensing episode 8/9.  HsTrop 11, BNP 415.8, CXR: no acute cardiopulmonary disease, K 3.8. EKG: NSR 97bpm, nonspecific IVCD w/ LAD  Doesn't appear fluid overloaded on exam. Takes all medications as prescribed at home. Denies SOB unless w/ a lot of activity. CP reproducible on palpation. Denies ever feeling any palpitations. Denies CP and SOB on exam.  Imaging/studies reviewed:  Echo (2/14): EF 20-25% LHC (12/14): nonobstructive CAD Echo (4/15) with EF 45-50%, mild diffuse hypokinesis, PA systolic pressure 38 mmHg.   Echo (6/16) with EF 40-45%, mild LVH, septal and inferior hypokinesis.   Echo (3/18): EF 30-35%. Grade 1 DD Echo (6/18): EF 45-50%, moderate LVH, normal RV size with mildly decreased systolic function.  Echo (11/19): EF 35-40%, moderate LVH, moderate diastolic dysfunction, normal RV size with mildly decreased systolic function, PASP 43 mmHg.  PYP scan (12-19): suggestive of transthyretin amyloidosis Echo (11/20): EF 40-45%, mild LVH, mildly decreased RV systolic function.  Echo (2/22): EF 55-60%, moderate LVH, normal RV.  Echo (5/23):  EF 45-50%. LV w/ regional wall abnormalities. Severe LVH, GIIDD, RV function normal. LA and RA mildly dilated, no MS, trivial MR, TR mild   Review of Systems: [y] = yes, _0  = no  General: Weight gain _0 ; Weight loss _1 ; Anorexia _2 ; Fatigue _3 ; Fever _4 ; Chills _5 ; Weakness _6   Cardiac: Chest pain/pressure [Y ]; Resting SOB _7 ; Exertional SOB [Y ]; Orthopnea _8 ; Pedal Edema _9 ; Palpitations _10 ; Syncope _11 ; Presyncope _12 ; Paroxysmal nocturnal dyspnea_13   Pulmonary: Cough _14 ; Wheezing_15 ; Hemoptysis_16 ; Sputum _17 ; Snoring _18   GI: Vomiting_19 ; Dysphagia_20 ; Melena_21 ; Hematochezia _22 ; Heartburn_23 ; Abdominal pain _24 ;  Constipation _25 ; Diarrhea _26 ; BRBPR _27   GU: Hematuria_28 ; Dysuria _29 ; Nocturia_30   Vascular: Pain in legs with walking _31 ; Pain in feet with lying flat _32 ; Non-healing sores _33 ; Stroke _34 ; TIA _35 ; Slurred speech _36 ;  Neuro: Headaches_37 ; Vertigo_38 ; Seizures_39 ; Paresthesias_40 ;Blurred vision _41 ; Diplopia _42 ; Vision changes _43   Ortho/Skin: Arthritis _44 ; Joint pain [Y ]; Muscle pain _45 ; Joint swelling _46 ; Back Pain _47 ; Rash _48   Psych: Depression_49 ; Anxiety_50   Heme: Bleeding problems _51 ; Clotting disorders _52 ; Anemia _53   Endocrine: Diabetes [Y ]; Thyroid dysfunction_54   Home Medications Prior to Admission medications   Medication Sig Start Date End Date Taking? Authorizing Provider  apixaban (ELIQUIS) 5 MG TABS tablet TAKE 1 TABLET(5 MG) BY MOUTH TWICE DAILY 08/31/20   Larey Dresser, MD  atorvastatin (LIPITOR) 40 MG tablet Take 1 tablet by mouth once daily 02/14/21   Larey Dresser, MD  baclofen (LIORESAL) 10 MG tablet Take 10 mg by mouth 2 (two) times daily. 02/08/18   [provider]  carvedilol (COREG) 25 MG tablet TAKE ONE TABLET BY MOUTH TWICE A DAY WITH MEALS 01/03/21   Larey Dresser, MD  cyclobenzaprine (FEXMID) 7.5 MG tablet Take 1 tablet (7.5 mg total) by mouth 2 (two) times daily as needed for muscle spasms. 04/05/21   Azucena Cecil, PA-C  ENTRESTO 97-103 MG TAKE ONE TABLET BY MOUTH TWICE A DAY 01/03/21   Larey Dresser, MD  furosemide (LASIX) 20 MG tablet Take 3 tablets (60 mg total) by mouth daily. 08/15/20   Milford, Maricela Bo, FNP  gabapentin (NEURONTIN) 100 MG capsule Take 100 mg by mouth 3 (three) times daily. 03/10/18   [provider]  glucose blood (FREESTYLE TEST STRIPS) test strip Use as instructed 04/25/16   Ghimire, Henreitta Leber, MD  glucose monitoring kit (FREESTYLE) monitoring kit 1 each by Does not apply route 4 (four) times daily - after meals and at bedtime. 1 month Diabetic Testing Supplies for QAC-QHS accuchecks. 04/25/16    Ghimire, Henreitta Leber, MD  HUMALOG KWIKPEN 100 UNIT/ML KwikPen Sliding scale 02/12/18   [provider]  insulin glargine (LANTUS) 100 UNIT/ML Solostar Pen Inject 50 Units into the skin at bedtime.    [provider]  Insulin Pen Needle 32G X 8 MM MISC Use as directed 06/18/16   Arbutus Leas, NP  isosorbide-hydrALAZINE (BIDIL) 20-37.5 MG tablet TAKE 2 TABLETS BY MOUTH 3 TIMES  DAILY ; NEEDS APPOINTMENT FOR  MORE REFILLS 05/20/21   Larey Dresser, MD  Lancets (FREESTYLE) lancets Use as instructed 06/18/16   Arbutus Leas, NP  metFORMIN (GLUMETZA) 1000 MG (MOD) 24 hr tablet Take 1,000 mg by mouth in the morning and at bedtime.    [provider]  oxyCODONE (ROXICODONE) 5 MG immediate release tablet Take 1 tablet (  5 mg total) by mouth every 4 (four) hours as needed for severe pain. 06/14/21   Smoot, Sarah A, PA-C  potassium chloride SA (KLOR-CON) 10 MEQ tablet Take 6 tablets (60 mEq total) by mouth daily. 08/15/20 06/06/22  Rafael Bihari, FNP  spironolactone (ALDACTONE) 25 MG tablet TAKE 1 TABLET(25 MG) BY MOUTH DAILY 08/15/20   Milford, [place], FNP  Tafamidis (VYNDAMAX) 61 MG CAPS TAKE 1 CAPSULE (61 MG) BY MOUTH DAILY. 10/11/20 10/11/21  Larey Dresser, MD    Past Medical History: Past Medical History:  Diagnosis Date   Anemia    a. Noted on 07/2012 labs, instructed to f/u PCP.   Arthritis    "joints" (11/18/2012)   CAD (coronary artery disease), native coronary artery    a. Nonobstructive by cath 02/2012 (done because of low EF).   Chronic bronchitis ([place])    "~ every other year" (11/18/2012)   Chronic combined systolic and diastolic CHF (congestive heart failure) ([place])    a. 03/05/12 echo:  LVEF 20-25%, moderate LVH , inferior and basal to mid septal akinesis, anterior moderate to severe hypokinesis and grade 2 diastolic dysfunction. b. EF 07/2012: EF still 25% (unclear medication compliance).   Chronic lower back pain    Headache(784.0)    "often; maybe not daily"  (11/18/2012)   High cholesterol    History of noncompliance with medical treatment    Hypertension    LBBB (left bundle branch block)    Orthopnea    Tobacco abuse    Type II diabetes mellitus ([place])     Past Surgical History: Past Surgical History:  Procedure Laterality Date   BI-VENTRICULAR IMPLANTABLE CARDIOVERTER DEFIBRILLATOR N/A 11/18/2012   Procedure: BI-VENTRICULAR IMPLANTABLE CARDIOVERTER DEFIBRILLATOR  (CRT-D);  Surgeon: Evans Lance, MD;  Location: [facility] CATH LAB;  Service: Cardiovascular;  Laterality: N/A;   BI-VENTRICULAR IMPLANTABLE CARDIOVERTER DEFIBRILLATOR  (CRT-D)  11/18/2012   BIV ICD GENERATOR CHANGEOUT N/A 11/22/2020   Procedure: BIV ICD GENERATOR CHANGEOUT;  Surgeon: Evans Lance, MD;  Location: [facility] CV LAB;  Service: Cardiovascular;  Laterality: N/A;   CARDIAC CATHETERIZATION  03/04/12   nonobstructive CAD, elevated LVEDP and tortuous vessels suggestive of long-standing hypertension   COLONOSCOPY WITH PROPOFOL N/A 12/12/2016   Procedure: COLONOSCOPY WITH PROPOFOL;  Surgeon: Carol Ada, MD;  Location: [facility] ENDOSCOPY;  Service: Endoscopy;  Laterality: N/A;   JOINT REPLACEMENT     Bilateral hip and right knee   LEFT HEART CATH N/A 03/05/2012   Procedure: LEFT HEART CATH;  Surgeon: Larey Dresser, MD;  Location: [facility] CATH LAB;  Service: Cardiovascular;  Laterality: N/A;    Family History: Family History  Problem Relation Age of Onset   Heart failure Mother    Heart disease Neg Hx     Social History: Social History   Socioeconomic History   Marital status: Single    Spouse name: Not on file   Number of children: 4   Years of education: 11   Highest education level: Not on file  Occupational History   Not on file  Tobacco Use   Smoking status: Former    Packs/day: 1.00    Years: 40.00    Total pack years: 40.00    Types: Cigarettes    Start date: 06/23/1972    Quit date: 03/26/2012    Years since quitting: 9.4   Smokeless tobacco: Never   Vaping Use   Vaping Use: Never used  Substance and Sexual Activity   Alcohol use: No  Drug use: No   Sexual activity: Yes  Other Topics Concern   Not on file  Social History Narrative   Not on file   Social Determinants of Health   Financial Resource Strain: Low Risk  (01/14/2018)   Overall Financial Resource Strain (CARDIA)    Difficulty of Paying Living Expenses: Not hard at all  Food Insecurity: No Food Insecurity (01/14/2018)   Hunger Vital Sign    Worried About Running Out of Food in the Last Year: Never true    Ran Out of Food in the Last Year: Never true  Transportation Needs: No Transportation Needs (01/14/2018)   PRAPARE - Hydrologist (Medical): No    Lack of Transportation (Non-Medical): No  Physical Activity: Not on file  Stress: Not on file  Social Connections: Not on file    Allergies:  Allergies  Allergen Reactions   Morphine And Related Nausea Only    Severe nausea   Penicillins Other (See Comments)    Pt states she has had a pain in her leg since a penicillin injection 2 months ago (reported 08/31/17).  Has tolerated amoxicillin oral.  Has patient had a PCN reaction causing immediate rash, facial/tongue/throat swelling, SOB or lightheadedness with hypotension: No Has patient had a PCN reaction causing severe rash involving mucus membranes or skin necrosis: No Has patient had a PCN reaction that required hospitalization: No Has patient had a PCN reaction occurring within the last 10 years: Yes If a    Objective:    Vital Signs:   Temp:  [98.6 F (37 C)] 98.6 F (37 C) (08/11 0809) Pulse Rate:  [81-99] 85 (08/11 1000) Resp:  [10-20] 13 (08/11 1000) BP: (155-181)/(82-101) 179/93 (08/11 1000) SpO2:  [93 %-100 %] 95 % (08/11 1000) Weight:  [104.3 kg] 104.3 kg (08/11 0829)    Weight change: Filed Weights   09/06/21 0829  Weight: 104.3 kg    Intake/Output:  No intake or output data in the 24 hours ending 09/06/21  1156    Physical Exam    General:  well appearing. Looks stated age. No respiratory difficulty HEENT: normal Neck: supple. JVD ~10 cm. Carotids 2+ bilat; no bruits. No lymphadenopathy or thyromegaly appreciated. Cor: PMI nondisplaced. Regular rate & rhythm. No rubs, gallops or murmurs.Pain on palpation: midsternal.  Lungs: clear Abdomen: soft, nontender, nondistended. No hepatosplenomegaly. No bruits or masses. Good bowel sounds. Extremities: no cyanosis, clubbing, rash, edema  Neuro: alert & oriented x 3, cranial nerves grossly intact. moves all 4 extremities w/o difficulty. Affect pleasant.   Telemetry   NSR in 90's. PVC's 1-2/hr. Intt wide-complex tachycardia 2/2 LBBB (Personally reviewed)    EKG    EKG: NSR 97bpm, nonspecific IVCD w/ LAD  Labs   Basic Metabolic Panel: Recent Labs  Lab 09/06/21 0812  NA 133*  K 3.8  CL 101  CO2 25  GLUCOSE 246*  BUN 11  CREATININE 0.90  CALCIUM 8.9    Liver Function Tests: Recent Labs  Lab 09/06/21 0812  AST 14*  ALT 11  ALKPHOS 89  BILITOT 0.6  PROT 6.9  ALBUMIN 3.6   Recent Labs  Lab 09/06/21 0812  LIPASE 41   No results for input(s): "AMMONIA" in the last 168 hours.  CBC: Recent Labs  Lab 09/06/21 0812  WBC 8.2  NEUTROABS 5.9  HGB 12.0  HCT 34.6*  MCV 88.3  PLT 340    Cardiac Enzymes: No results for input(s): "CKTOTAL", "CKMB", "CKMBINDEX", "TROPONINI"  in the last 168 hours.  BNP: BNP (last 3 results) Recent Labs    06/05/21 0918 09/06/21 0820  BNP 214.4* 415.8*    ProBNP (last 3 results) No results for input(s): "PROBNP" in the last 8760 hours.   CBG: No results for input(s): "GLUCAP" in the last 168 hours.  Coagulation Studies: No results for input(s): "LABPROT", "INR" in the last 72 hours.   Imaging   DG Chest 2 View  Result Date: 09/06/2021 CLINICAL DATA:  Chest pain and shortness of breath since this morning. EXAM: CHEST - 2 VIEW COMPARISON:  07/31/2020.  CT, 04/20/2016.  FINDINGS: Cardiac silhouette is mildly enlarged. Stable left anterior chest wall biventricular cardioverter-defibrillator. No mediastinal or hilar masses. No evidence of adenopathy. Clear lungs.  No pleural effusion or pneumothorax. Skeletal structures are intact. IMPRESSION: No acute cardiopulmonary disease. Electronically Signed   By: Lajean Manes M.D.   On: 09/06/2021 08:49     Medications:     Current Medications:   Infusions:     Patient Profile   Ms. Selden is a 71 y.o. AA female w/ PMH of CHF w/ ICD, CAD, a. Fib on eliquis, DB, ILD who presented to the ED for chest pain. AHF team consulted for acute on chronic diastolic heart failure.   Assessment/Plan  1. Chronic diastolic CHF: Nonischemic cardiomyopathy, thought to be related to HTN. S/P CRT-D (Medtronic). She appears to be BiV pacing appropriately.  Echo (2/14): EF 20-25% LHC (12/14): nonobstructive CAD Echo (4/15) with EF 45-50%, mild diffuse hypokinesis, PA systolic pressure 38 mmHg.   Echo (6/16) with EF 40-45%, mild LVH, septal and inferior hypokinesis.   Echo (3/18): EF 30-35%. Grade 1 DD Echo (6/18): EF 45-50%, moderate LVH, normal RV size with mildly decreased systolic function.  Echo (11/19): EF 35-40%, moderate LVH, moderate diastolic dysfunction, normal RV size with mildly decreased systolic function, PASP 43 mmHg.  PYP scan (12-19): suggestive of transthyretin amyloidosis Echo (11/20): EF 40-45%, mild LVH, mildly decreased RV systolic function.  Echo (2/22): EF 55-60%, moderate LVH, normal RV.  Echo (5/23):  EF 45-50%. LV w/ regional wall abnormalities. Severe LVH, GIIDD, RV function normal. LA and RA mildly dilated, no MS, trivial MR, TR mild - Presented with CP, EKG: NSR 97bpm, nonspecific IVCD w/ LAD, HsTrop (-), K 3.8, BNP 415.8, NYHA class II. Volume looks ok on exam and on Optivol, will give 80 mg IV lasix x1 w/ PO potassium replacement.  - Continue Coreg 25 mg bid.  - Continue Entresto 97/103 mg bid. -  Continue Bidil 20/37.4m Tid. - Continue lasix 670mdaily at home - Continue spiro 25 mg daily.  - Continue potassium chloride 6070mdaily - Strict I&O, daily weights 2. Hyperlipidemia - Continue atorvastatin 14m60mily.  3. HTN - BP elevated, didn't take home meds today 4. PAF - No prolonged episodes.    - Continue Eliquis for anticoagulation.  5. Cardiac amyloidosis - PYP scan (12/19) with H/CL 1.62, grade 2 visual, genetic testing negative for TTR mutations.  - Continue tafamidis.  6. DM2 7. ILD - PFTs (9/16) with FEV1 77%, FVC 76%, ratio 101%, TLC 63%, DLCO 41% => moderate restrictive deficit  8. VT - ICD interrogated and showed: 3 runs of VT in July, none recent, and none of them sustained, device never treated pt, acting as expected, 293 sensing episodes, last sensing episode 8/9 - EKG: NSR 97bpm, nonspecific IVCD w/ LAD - K 3.8 - NSR in 90's.  - PVC's 1-2/hr  - EP  interrogated at bedside. 98% effective, no shocks. Some wide-complex tachycardia 2/2 LBBB  Ok to d/c after IV lasix from AHF standpoint. Continue regular home medicine regimen.   Length of Stay: [place], AGACNP-BC  09/06/2021, 11:56 AM  Advanced Heart Failure Team Pager [phone] (M-F; 7a - 5p)  Please contact [facility] for night-coverage after hours (4p -7a ) and weekends on [email or web address]  Patient seen and examined with the above-signed Advanced Practice Provider and/or Housestaff. I personally reviewed laboratory data, imaging studies and relevant notes. I independently examined the patient and formulated the important aspects of the plan. I have edited the note to reflect any of my changes or salient points. I have personally discussed the plan with the patient and/or family.  71 y/o with DM2, systolic HF due to NICM (EF 45-50%), TTR cardiac amyloidosis, LBBB s/p CRT and PAF. Cath 2014 normal cors.   Presents to ER with episode of CP. Says she has been very active lately and doing well but under a  lot of emotional stress.   This am had an episode of sharp CP without associated symptoms or trigger. EMS called. Got ASA and NTG and pain resolved. In ER, ECG with paced rhythm. Hstrop negative x 2. BNP slightly elevated. Chest wall tender on palpation  Denies any other episodes of CP either in ER or over past few months,. Says she was just stressed. ICD interrogated. Activity level ~6hr/day. Optivol flat. Occasional NSVT  General:  Well appearing. No resp difficulty HEENT: normal Neck: supple. no JVD. Carotids 2+ bilat; no bruits. No lymphadenopathy or thryomegaly appreciated. Cor: PMI nondisplaced. Regular rate & rhythm. No rubs, gallops or murmurs. Chest wall  Lungs: clear Abdomen: soft, nontender, nondistended. No hepatosplenomegaly. No bruits or masses. Good bowel sounds. Extremities: no cyanosis, clubbing, rash, edema Neuro: alert & orientedx3, cranial nerves grossly intact. moves all 4 extremities w/o difficulty. Affect pleasant  Given atypical symptoms and negative hstrop doubt CP is cardiac. BNP was mildly elevated so we gave one dose IV lasix however overall volume status on Optivol looks good.   I discussed with patient and Dr. Gustavus Messing. I think she is stable for d/c from ER with outpatient f/u as scheduled. Aware of need to return to ER if CP returns.   Glori Bickers, MD  5:53 PM

## 2021-09-06 NOTE — ED Triage Notes (Signed)
BIB [facility] from home for chest pressure, onset 0300, given ASA'324mg'$  , ntg x2 SL, and '4mg'$  zofran, 18g NSL R FA, BP 118 palpated, SPO2 100% RA, RR 16, BS 249. Denies pain or nausea on arrival.

## 2021-09-06 NOTE — ED Provider Notes (Signed)
[facility] EMERGENCY DEPARTMENT Provider Note   CSN: [id number] Arrival date & time: 09/06/21  0758     History  Chief Complaint  Patient presents with   Chest Pain    [name] is a 71 y.o. female.  The history is provided by the patient and medical records. No language interpreter was used.  Chest Pain Pain location:  Substernal area Pain quality: aching, crushing and pressure   Pain radiates to:  Does not radiate Pain severity:  Severe Onset quality:  Sudden Duration:  6 hours Timing:  Constant Progression:  Resolved Chronicity:  New Relieved by:  Aspirin and nitroglycerin Worsened by:  Exertion Ineffective treatments:  None tried Associated symptoms: diaphoresis, fatigue, nausea, palpitations and shortness of breath   Associated symptoms: no abdominal pain, no altered mental status, no back pain, no cough, no dizziness, no fever, no headache, no numbness, no vomiting and no weakness   Risk factors: coronary artery disease        Home Medications Prior to Admission medications   Medication Sig Start Date End Date Taking? Authorizing Provider  apixaban (ELIQUIS) 5 MG TABS tablet TAKE 1 TABLET(5 MG) BY MOUTH TWICE DAILY 08/31/20   Larey Dresser, MD  atorvastatin (LIPITOR) 40 MG tablet Take 1 tablet by mouth once daily 02/14/21   Larey Dresser, MD  baclofen (LIORESAL) 10 MG tablet Take 10 mg by mouth 2 (two) times daily. 02/08/18   [provider]  carvedilol (COREG) 25 MG tablet TAKE ONE TABLET BY MOUTH TWICE A DAY WITH MEALS 01/03/21   Larey Dresser, MD  cyclobenzaprine (FEXMID) 7.5 MG tablet Take 1 tablet (7.5 mg total) by mouth 2 (two) times daily as needed for muscle spasms. 04/05/21   Azucena Cecil, PA-C  ENTRESTO 97-103 MG TAKE ONE TABLET BY MOUTH TWICE A DAY 01/03/21   Larey Dresser, MD  furosemide (LASIX) 20 MG tablet Take 3 tablets (60 mg total) by mouth daily. 08/15/20   Milford, Maricela Bo, FNP  gabapentin  (NEURONTIN) 100 MG capsule Take 100 mg by mouth 3 (three) times daily. 03/10/18   [provider]  glucose blood (FREESTYLE TEST STRIPS) test strip Use as instructed 04/25/16   Ghimire, Henreitta Leber, MD  glucose monitoring kit (FREESTYLE) monitoring kit 1 each by Does not apply route 4 (four) times daily - after meals and at bedtime. 1 month Diabetic Testing Supplies for QAC-QHS accuchecks. 04/25/16   Ghimire, Henreitta Leber, MD  HUMALOG KWIKPEN 100 UNIT/ML KwikPen Sliding scale 02/12/18   [provider]  insulin glargine (LANTUS) 100 UNIT/ML Solostar Pen Inject 50 Units into the skin at bedtime.    [provider]  Insulin Pen Needle 32G X 8 MM MISC Use as directed 06/18/16   Arbutus Leas, NP  isosorbide-hydrALAZINE (BIDIL) 20-37.5 MG tablet TAKE 2 TABLETS BY MOUTH 3 TIMES  DAILY ; NEEDS APPOINTMENT FOR  MORE REFILLS 05/20/21   Larey Dresser, MD  Lancets (FREESTYLE) lancets Use as instructed 06/18/16   Arbutus Leas, NP  metFORMIN (GLUMETZA) 1000 MG (MOD) 24 hr tablet Take 1,000 mg by mouth in the morning and at bedtime.    [provider]  oxyCODONE (ROXICODONE) 5 MG immediate release tablet Take 1 tablet (5 mg total) by mouth every 4 (four) hours as needed for severe pain. 06/14/21   Smoot, Sarah A, PA-C  potassium chloride SA (KLOR-CON) 10 MEQ tablet Take 6 tablets (60 mEq total) by mouth daily.  08/15/20 06/06/22  Rafael Bihari, FNP  spironolactone (ALDACTONE) 25 MG tablet TAKE 1 TABLET(25 MG) BY MOUTH DAILY 08/15/20   [place], [place], FNP  Tafamidis (VYNDAMAX) 61 MG CAPS TAKE 1 CAPSULE (61 MG) BY MOUTH DAILY. 10/11/20 10/11/21  Larey Dresser, MD      Allergies    Morphine and related and Penicillins    Review of Systems   Review of Systems  Constitutional:  Positive for diaphoresis and fatigue. Negative for chills and fever.  HENT:  Negative for congestion.   Eyes:  Negative for visual disturbance.  Respiratory:  Positive for shortness of breath. Negative  for cough, chest tightness and wheezing.   Cardiovascular:  Positive for chest pain and palpitations.  Gastrointestinal:  Positive for nausea. Negative for abdominal pain, constipation, diarrhea and vomiting.  Genitourinary:  Negative for dysuria and flank pain.  Musculoskeletal:  Negative for back pain, neck pain and neck stiffness.  Skin:  Negative for rash and wound.  Neurological:  Negative for dizziness, weakness, light-headedness, numbness and headaches.  Psychiatric/Behavioral:  Negative for agitation and confusion.   All other systems reviewed and are negative.   Physical Exam Updated Vital Signs BP (!) 155/96 (BP Location: Right Arm)   Pulse 88   Temp 98.6 F (37 C) (Oral)   Resp 20   SpO2 97%  Physical Exam Vitals and nursing note reviewed.  Constitutional:      General: She is not in acute distress.    Appearance: She is well-developed. She is not ill-appearing, toxic-appearing or diaphoretic.  HENT:     Head: Normocephalic and atraumatic.  Eyes:     Conjunctiva/sclera: Conjunctivae normal.     Pupils: Pupils are equal, round, and reactive to light.  Cardiovascular:     Rate and Rhythm: Normal rate and regular rhythm.     Heart sounds: Normal heart sounds. No murmur heard. Pulmonary:     Effort: Pulmonary effort is normal. No respiratory distress.     Breath sounds: Normal breath sounds. No decreased breath sounds, wheezing, rhonchi or rales.  Chest:     Chest wall: No tenderness.  Abdominal:     Palpations: Abdomen is soft.     Tenderness: There is no abdominal tenderness.  Musculoskeletal:        General: No swelling.     Cervical back: Neck supple.     Right lower leg: No tenderness. No edema.     Left lower leg: No tenderness. No edema.  Skin:    General: Skin is warm and dry.     Capillary Refill: Capillary refill takes less than 2 seconds.     Findings: No erythema.  Neurological:     General: No focal deficit present.     Mental Status: She is  alert.  Psychiatric:        Mood and Affect: Mood normal.     ED Results / Procedures / Treatments   Labs (all labs ordered are listed, but only abnormal results are displayed) Labs Reviewed  CBC WITH DIFFERENTIAL/PLATELET - Abnormal; Notable for the following components:      Result Value   HCT 34.6 (*)    All other components within normal limits  COMPREHENSIVE METABOLIC PANEL - Abnormal; Notable for the following components:   Sodium 133 (*)    Glucose, Bld 246 (*)    AST 14 (*)    All other components within normal limits  BRAIN NATRIURETIC PEPTIDE - Abnormal; Notable for the following components:  B Natriuretic Peptide 415.8 (*)    All other components within normal limits  LIPASE, BLOOD  TROPONIN I (HIGH SENSITIVITY)  TROPONIN I (HIGH SENSITIVITY)    EKG EKG Interpretation  Date/Time:  Friday September 06 2021 08:08:13 EDT Ventricular Rate:  97 PR Interval:  179 QRS Duration: 129 QT Interval:  393 QTC Calculation: 500 R Axis:   -38 Text Interpretation: Sinus rhythm Nonspecific IVCD with LAD Probable lateral infarct, age indeterminate when compared to prior, similar appearance. No sTEMI Confirmed by Antony Blackbird [phone]) on 09/06/2021 8:14:28 AM  Radiology DG Chest 2 View  Result Date: 09/06/2021 CLINICAL DATA:  Chest pain and shortness of breath since this morning. EXAM: CHEST - 2 VIEW COMPARISON:  07/31/2020.  CT, 04/20/2016. FINDINGS: Cardiac silhouette is mildly enlarged. Stable left anterior chest wall biventricular cardioverter-defibrillator. No mediastinal or hilar masses. No evidence of adenopathy. Clear lungs.  No pleural effusion or pneumothorax. Skeletal structures are intact. IMPRESSION: No acute cardiopulmonary disease. Electronically Signed   By: Lajean Manes M.D.   On: 09/06/2021 08:49    Procedures Procedures    Medications Ordered in ED Medications  furosemide (LASIX) injection 80 mg (80 mg Intravenous Given 09/06/21 1430)  potassium chloride SA  (KLOR-CON M) CR tablet 60 mEq (60 mEq Oral Given 09/06/21 1430)    ED Course/ Medical Decision Making/ A&P                           Medical Decision Making Amount and/or Complexity of Data Reviewed Labs: ordered. Radiology: ordered.    [name] is a 71 y.o. female with a past medical history significant for CHF with ICD, CAD, atrial fibrillation on Eliquis therapy, diabetes, and interstitial lung disease who presents with chest pain.  According to patient, she awoke at 3 AM with crushing central chest pain that was 10 out of 10 in severity.  She reports it did not radiate but it was very exertional, associated with diaphoresis, nausea, lightheadedness, palpitations, and the sensation that her ICD was intermittently shocking her.  She reports never felt like a large fire but felt like "small shocks".  She reports nausea and she did not vomit.  She reports that she has felt well recently with no recent fevers, chills, congestion, or cough.  She denies any constipation, diarrhea, or urinary changes.  Denies any leg pain or leg swelling.  Does not feel like she is fluid overloaded.   Patient was given aspirin, Zofran, and nitro with EMS and her pain has resolved.  The symptoms were very exertional while she was in the house.  It was not pleuritic.  She reports she has not missed any doses of her Eliquis.  EKG on arrival appears similar to prior with faster rate.  No STEMI.  On my exam, lungs were clear and chest was nontender.  Abdomen was nontender.  Good pulses in extremities.  Legs are nontender and nonedematous.  Patient is resting now symptom-free.  Given the patient's history of CHF and CAD with her description of symptoms, I do feel need to do workup including chest x-ray, labs, and will try to interrogate her ICD.  Anticipate discussion with cardiology to determine disposition given the patient's concerning chest pain.  9:53 AM Workup has begun to return.  Initial troponin is  negative.  Metabolic panel reassuring.  CBC reassuring.  Lipase not elevated.  Chest x-ray reveals no acute abnormality.  The ICD report to nursing was  that patient's had several episodes of VT in July that did not need intervention and were not sustained.  No recent episodes.  Unclear what her shocking feeling was or if she was having symptomatic palpitations.  Due to the chest pain story and history, will call cardiology for recommendations.   Cardiology saw patient and feel she is safe for discharge home second opponent is reassuring.  BNP is elevated but better than it was 4 years ago.  They will see her in clinic.  Patient agrees with plan of care and was discharged in good condition without any further complaints.         Final Clinical Impression(s) / ED Diagnoses Final diagnoses:  Chest pain, unspecified type    Rx / DC Orders ED Discharge Orders     None       Clinical Impression: 1. Chest pain, unspecified type     Disposition: Discharge  Condition: Good  I have discussed the results, Dx and Tx plan with the pt(& family if present). He/she/they expressed understanding and agree(s) with the plan. Discharge instructions discussed at great length. Strict return precautions discussed and pt &/or family have verbalized understanding of the instructions. No further questions at time of discharge.    Discharge Medication List as of 09/06/2021  2:39 PM      Follow Up: your cardiologist     [place] [address] [id number] [place]       Marie Borowski, Gwenyth Allegra, MD 09/06/21 1511

## 2021-09-06 NOTE — ED Notes (Signed)
No changes. AICD interrogated and sent.

## 2021-09-06 NOTE — Discharge Instructions (Addendum)
Your work-up today did not seem to reveal a cardiac cause of your symptoms.  We spoke to the cardiology team who saw you and feel you are safe for discharge home.  Please rest and stay hydrated and follow-up with them in clinic.  If any symptoms change or worsen acutely, please return to the nearest Emergency Department.

## 2021-09-06 NOTE — ED Notes (Signed)
Pt alert, NAD, calm, going to xray.

## 2021-09-11 ENCOUNTER — Other Ambulatory Visit: Payer: Self-pay | Admitting: Internal Medicine

## 2021-09-11 DIAGNOSIS — Z1231 Encounter for screening mammogram for malignant neoplasm of breast: Secondary | ICD-10-CM

## 2021-09-12 NOTE — Progress Notes (Signed)
Remote ICD transmission.   

## 2021-09-18 ENCOUNTER — Other Ambulatory Visit (HOSPITAL_COMMUNITY): Payer: Self-pay

## 2021-09-23 ENCOUNTER — Other Ambulatory Visit (HOSPITAL_COMMUNITY): Payer: Self-pay

## 2021-09-27 ENCOUNTER — Ambulatory Visit: Payer: Medicare Other

## 2021-10-03 ENCOUNTER — Telehealth (HOSPITAL_COMMUNITY): Payer: Self-pay

## 2021-10-03 NOTE — Telephone Encounter (Signed)
Called to confirm/remind patient of their appointment at the [facility] on 10/07/21.   Patient reminded to bring all medications and/or complete list.  Confirmed patient has transportation. Gave directions, instructed to utilize [place] parking.  Confirmed appointment prior to ending call.

## 2021-10-04 NOTE — Progress Notes (Signed)
Patient ID: BRENLEE KOSKELA, female   DOB: 08-21-1950, 71 y.o.   MRN: [id number]   Advanced Heart Failure Clinic Note   PCP: Dr. Alyson Ingles Cardiology: Dr Dora Sims is a 71 y.o. female with history of nonischemic cardiomyopathy.  She was admitted with CHF exacerbation in 02/2012.  EF 20-25% on echo, LHC with nonobstructive CAD.  She was started on cardiac meds and discharged.  In 07/2012, she was admitted again with CHF exacerbation.  She had run out of Lasix.  She was taking her other heart medications as ordered, however.  She was diuresed and discharged. She had a chronic LBBB, and Medtronic CRT-D device was placed in 10/14.  She was admitted in 6/16 with hypertensive emergency and CHF exacerbation.   Also of note, she had PFTs in 9/16 showing restrictive spirometry with low lung volume and DLCO.  She was supposed to get a high resolution CT to evaluate for interstitial lung disease but never had the study.     Admitted March 2018 with urosepsis--> E Coli Bacteremia. Completed antibiotic course. EF was down from previous to 30-35%. Also had atrial fibrillation so she was loaded on amiodarone. Placed on eliquis. Discharge weight was 208 pounds.  She is now off amiodarone.   Admitted to [facility] 1/16 -> 02/13/16 with CP and dizziness. Found to be in Afib. Converted spontaneously overnight with BB and diuresis.   Echo 11/19 showed EF 35-40%.  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    Patient had COVID-19 infection in 10/20.   Echo in 11/20 showed EF 40-45% with mild LVH and mildly decreased RV systolic function.  Echo in 2/22 showed EF up to 55-60%, moderate LVH, normal RV. Echo 5/23 showed EF 45-50%, with severe LVH  Seen in the ED 09/06/21 with CP. AHF consulted. Hs-trop negative, BNP 415. Given IV lasix and discharged home.  Today she returns for HF follow up. Overall feeling fine. She is not SOB walking on flat ground, limited by  fatigue if she does too much. Physically limited by knee arthritis. Denies palpitations, abnormal bleeding, CP, dizziness, edema, or PND/Orthopnea. Appetite ok. No fever or chills. Weight at home 228 pounds. Taking all medications. Has been drinking more fluid recently.  Medtronic device interrogation: OptiVol creeping up, thoracic impedence down, , 99% BiV pacing, no VT/VF, 4-6 hrs/day activity.      ECG (personally reviewed): none ordered today.  Labs (2/14): SPEP negative, UPEP negative, HIV negative Labs (04/14/2017): K 3.8 Creatinine 0.94  Labs (9/19): K 3.3, creatinine 0.86 Labs (1/20): K 4, creatinine 0.87 Labs (2/20): K 4.2, creatinine 1.16 Labs (8/20): K 3.8, creatinine 0.78 Labs (11/20): LDL 104 Labs (12/20): K 3.6, creatinine 0.93 Labs (7/21): K 3.5, creatinine 1.0 Labs (12/21): K 3.6, creatinine 1.05, hgbA1c 8 Labs (4/22): K 3.1, creatinine 1.01 Labs (7/22): K 4.2, creatinine 0.99 Labs (2/23): K 4.3, creatinine 1.01, LDL 94, HDL 47 Labs (8/23): K 3.8, creatinine 0.90   PMH: 1. HTN 2. Type II diabetes with neuropathy 3. Nonischemic cardiomyopathy: ? Due to HTN versus LBBB CMP.  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  Echo (2/14) with EF 20-25%.  Echo (7/14) with EF 25%, diffuse hypokinesis.  HIV, SPEP, UPEP negative.  Has LBBB. CRT-D 10/2012 (Medtronic).  Echo (4/15) with EF 45-50%, mild diffuse hypokinesis, PA systolic pressure 38 mmHg.  Echo (6/16) with EF 40-45%, mild LVH, septal and inferior hypokinesis.   - Echo (3/18): EF 30-35%.  Grade 1 DD - Echo (6/18): EF 45-50%, moderate LVH, normal RV size with mildly decreased systolic function.  - Echo (11/19): EF 35-40%, moderate LVH, moderate diastolic dysfunction, normal RV size with mildly decreased systolic function, PASP 43 mmHg.  - Echo (11/20): EF 40-45%, mild LVH, mildly decreased RV systolic function.  - Echo (2/22): EF 55-60%, moderate LVH, normal RV.  - Echo (5/23): EF 45-50%, severe LVH, normal RV 4. Chronic LBBB 5.  Right TKR 6. Bilateral THR.  7. Hyperlipidemia 8. ?COPD: Has oxygen for use with exertion.  - PFTs (9/16) with FEV1 77%, FVC 76%, ratio 101%, TLC 63%, DLCO 41% => moderate restrictive deficit  9. Atrial fibrillation: Paroxysmal.  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  11. COVID-19 infection in 10/20.  12. OSA: CPAP. 13. OA: Knees  SH: Prior smoker, quit 2/14.  Never drank ETOH.  No drugs. Lives with son.   FH: Mother with "heart trouble."   Review of systems complete and found to be negative unless listed in HPI.    Current Outpatient Medications  Medication Sig Dispense Refill   apixaban (ELIQUIS) 5 MG TABS tablet TAKE 1 TABLET(5 MG) BY MOUTH TWICE DAILY 180 tablet 2   atorvastatin (LIPITOR) 40 MG tablet Take 1 tablet by mouth once daily 90 tablet 3   carvedilol (COREG) 25 MG tablet TAKE ONE TABLET BY MOUTH TWICE A DAY WITH MEALS 180 tablet 2   celecoxib (CELEBREX) 200 MG capsule Take by mouth.     ENTRESTO 97-103 MG TAKE ONE TABLET BY MOUTH TWICE A DAY 180 tablet 2   gabapentin (NEURONTIN) 100 MG capsule Take 100 mg by mouth 3 (three) times daily.     glucose blood (FREESTYLE TEST STRIPS) test strip Use as instructed 100 each 0   glucose monitoring kit (FREESTYLE) monitoring kit 1 each by Does not apply route 4 (four) times daily - after meals and at bedtime. 1 month Diabetic Testing Supplies for QAC-QHS accuchecks. 1 each 1   HUMALOG KWIKPEN 100 UNIT/ML KwikPen Sliding scale     insulin glargine (LANTUS) 100 UNIT/ML Solostar Pen Inject 50 Units into the skin at bedtime.     Insulin Pen Needle 32G X 8 MM MISC Use as directed 100 each 0   isosorbide-hydrALAZINE (BIDIL) 20-37.5 MG tablet TAKE 2 TABLETS BY MOUTH 3 TIMES  DAILY ; NEEDS APPOINTMENT FOR  MORE REFILLS 540 tablet 3   Lancets (FREESTYLE) lancets Use as instructed 100 each 0   metFORMIN (GLUMETZA) 1000 MG (MOD) 24 hr tablet Take  1,000 mg by mouth in the morning and at bedtime.     oxyCODONE (ROXICODONE) 5 MG immediate release tablet Take 1 tablet (5 mg total) by mouth every 4 (four) hours as needed for severe pain. 30 tablet 0   potassium chloride (KLOR-CON M) 10 MEQ tablet Take 10 mEq by mouth 2 (two) times daily.     spironolactone (ALDACTONE) 25 MG tablet TAKE 1 TABLET(25 MG) BY MOUTH DAILY 90 tablet 3   Tafamidis (VYNDAMAX) 61 MG CAPS TAKE 1 CAPSULE (61 MG) BY MOUTH DAILY. 90 capsule 3   torsemide (DEMADEX) 100 MG tablet Take 50 mg by mouth daily.     No current facility-administered medications for this encounter.   BP (!) 152/84   Pulse 100   Wt 103.4 kg (228 lb)   SpO2 99%   BMI 35.71 kg/m   Wt Readings from Last  3 Encounters:  10/07/21 103.4 kg (228 lb)  09/06/21 104.3 kg (230 lb)  06/14/21 108.9 kg (240 lb)    Physical exam General:  NAD. No resp difficulty HEENT: Normal Neck: Supple. JVP 7-8. Carotids 2+ bilat; no bruits. No lymphadenopathy or thryomegaly appreciated. Cor: PMI nondisplaced. Regular rate & rhythm. No rubs, gallops or murmurs. Lungs: Clear Abdomen: Soft, nontender, nondistended. No hepatosplenomegaly. No bruits or masses. Good bowel sounds. Extremities: No cyanosis, clubbing, rash, trace BLE edema, R>L Neuro: Alert & oriented x 3, cranial nerves grossly intact. Moves all 4 extremities w/o difficulty. Affect pleasant.  Assessment/Plan: 1. Chronic systolic CHF: Nonischemic cardiomyopathy, thought to be related to HTN. S/P CRT-D (Medtronic). Last echo (2/22) with EF improved up to 55-60%. Echo 5/23 showed EF 45-50%, severe LVH. She is mildly volume overloaded by Optivol. She appears to be BiV pacing appropriately.  - Increase torsemide to 100 mg daily x 3 days + increase KCL to 20 bid x 3 days, then back to torsemide 50 mg daily + 10 KCL bid. BMET/BNP today. - Continue Coreg 25 mg bid.  - Continue Entresto 97/103 mg bid. - Continue Bidil 2 tabs tid. - Continue spiro 25 mg daily.   2. Hyperlipidemia: Continue statin.  3. HTN: Elevated today but she has not had her morning medications yet. BP generally well-controlled.  4. PAF: No prolonged episodes.    - Continue Eliquis for anticoagulation. Recent CBC stable. 5. 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 Genetic testing was negative (wild type).  - Continue tafamidis.  6. Knee osteoarthritis: She has significant limitation from knee pain.  - Ortho planning left knee arthroplasty this month.  Followup in 4 months with Dr. Aundra Dubin.    [place], [place]  10/07/2021

## 2021-10-07 ENCOUNTER — Ambulatory Visit (HOSPITAL_COMMUNITY)
Admission: RE | Admit: 2021-10-07 | Discharge: 2021-10-07 | Disposition: A | Discharge status: Home / Self Care | Payer: Medicare Other | Source: Ambulatory Visit | Attending: Family Medicine | Admitting: Family Medicine

## 2021-10-07 ENCOUNTER — Encounter (HOSPITAL_COMMUNITY): Payer: Self-pay

## 2021-10-07 VITALS — BP 152/84 | HR 100 | Wt 228.0 lb

## 2021-10-07 DIAGNOSIS — E854 Organ-limited amyloidosis: Secondary | ICD-10-CM | POA: Diagnosis not present

## 2021-10-07 DIAGNOSIS — I428 Other cardiomyopathies: Secondary | ICD-10-CM | POA: Diagnosis present

## 2021-10-07 DIAGNOSIS — Z7901 Long term (current) use of anticoagulants: Secondary | ICD-10-CM | POA: Diagnosis not present

## 2021-10-07 DIAGNOSIS — I5022 Chronic systolic (congestive) heart failure: Secondary | ICD-10-CM | POA: Insufficient documentation

## 2021-10-07 DIAGNOSIS — E785 Hyperlipidemia, unspecified: Secondary | ICD-10-CM | POA: Insufficient documentation

## 2021-10-07 DIAGNOSIS — I43 Cardiomyopathy in diseases classified elsewhere: Secondary | ICD-10-CM | POA: Diagnosis not present

## 2021-10-07 DIAGNOSIS — Z79899 Other long term (current) drug therapy: Secondary | ICD-10-CM | POA: Diagnosis not present

## 2021-10-07 DIAGNOSIS — M179 Osteoarthritis of knee, unspecified: Secondary | ICD-10-CM | POA: Insufficient documentation

## 2021-10-07 DIAGNOSIS — I480 Paroxysmal atrial fibrillation: Secondary | ICD-10-CM | POA: Diagnosis not present

## 2021-10-07 DIAGNOSIS — I110 Hypertensive heart disease with heart failure: Secondary | ICD-10-CM | POA: Insufficient documentation

## 2021-10-07 DIAGNOSIS — M1990 Unspecified osteoarthritis, unspecified site: Secondary | ICD-10-CM

## 2021-10-07 LAB — BASIC METABOLIC PANEL
Anion gap: 7 (ref 5–15)
BUN: 15 mg/dL (ref 8–23)
CO2: 25 mmol/L (ref 22–32)
Calcium: 9.2 mg/dL (ref 8.9–10.3)
Chloride: 108 mmol/L (ref 98–111)
Creatinine, Ser: 1.08 mg/dL — ABNORMAL HIGH (ref 0.44–1.00)
GFR, Estimated: 56 mL/min — ABNORMAL LOW (ref >60)
Glucose, Bld: 130 mg/dL — ABNORMAL HIGH (ref 70–99)
Potassium: 3.9 mmol/L (ref 3.5–5.1)
Sodium: 140 mmol/L (ref 135–145)

## 2021-10-07 LAB — BRAIN NATRIURETIC PEPTIDE: B Natriuretic Peptide: 130.5 pg/mL — ABNORMAL HIGH (ref 0.0–100.0)

## 2021-10-07 NOTE — Patient Instructions (Addendum)
Thank you for coming in today  Labs were done today, if any labs are abnormal the clinic will call you No news is good news  INCREASE Torsemide to 100 mg daily with 20 meq of Potassium twice a day for 3 days Then 10/10/2021 go back to Torsemide 50 mg daily with 10 meq of Potassium twice daily  Your physician recommends that you schedule a follow-up appointment in:  4 months with Dr. Aundra Dubin call in October November to make Januart 2024 appointment    Do the following things EVERYDAY: Weigh yourself in the morning before breakfast. Write it down and keep it in a log. Take your medicines as prescribed Eat low salt foods--Limit salt (sodium) to 2000 mg per day.  Stay as active as you can everyday Limit all fluids for the day to less than 2 liters  At the [facility], you and your health needs are our priority. As part of our continuing mission to provide you with exceptional heart care, we have created designated Provider Care Teams. These Care Teams include your primary Cardiologist (physician) and Advanced Practice Providers (APPs- Physician Assistants and Nurse Practitioners) who all work together to provide you with the care you need, when you need it.   You may see any of the following providers on your designated Care Team at your next follow up: Dr Glori Bickers Dr [name] Dr. Roxana Hires, NP Lyda Jester, [facility] [place], [place] Forest[place] Na, NP Audry Riles, PharmD   Please be sure to bring in all your medications bottles to every appointment.   If you have any questions or concerns before your next appointment please send [place] a message through [place] or call our office at [phone].    TO LEAVE A MESSAGE FOR THE NURSE SELECT OPTION 2, PLEASE LEAVE A MESSAGE INCLUDING: YOUR NAME DATE OF BIRTH CALL BACK NUMBER REASON FOR CALL**this is important as we prioritize the call backs  YOU WILL RECEIVE A CALL BACK THE  SAME DAY AS LONG AS YOU CALL BEFORE 4:00 PM

## 2021-10-09 ENCOUNTER — Other Ambulatory Visit: Payer: Self-pay | Admitting: Infectious Diseases

## 2021-10-09 DIAGNOSIS — Z1231 Encounter for screening mammogram for malignant neoplasm of breast: Secondary | ICD-10-CM

## 2021-10-14 ENCOUNTER — Other Ambulatory Visit (HOSPITAL_COMMUNITY): Payer: Self-pay | Admitting: Cardiology

## 2021-10-14 ENCOUNTER — Other Ambulatory Visit (HOSPITAL_COMMUNITY): Payer: Self-pay

## 2021-10-14 MED ORDER — VYNDAMAX 61 MG PO CAPS
ORAL_CAPSULE | ORAL | 3 refills | Status: DC
  Filled 2021-10-14: qty 30, 30d supply, fill #0
  Filled 2021-11-13: qty 30, 30d supply, fill #1
  Filled 2022-01-14: qty 30, 30d supply, fill #2

## 2021-10-17 ENCOUNTER — Other Ambulatory Visit (HOSPITAL_COMMUNITY): Payer: Self-pay

## 2021-10-23 ENCOUNTER — Other Ambulatory Visit (HOSPITAL_COMMUNITY): Payer: Self-pay

## 2021-11-06 DIAGNOSIS — M545 Low back pain, unspecified: Secondary | ICD-10-CM | POA: Diagnosis not present

## 2021-11-07 ENCOUNTER — Other Ambulatory Visit (HOSPITAL_COMMUNITY): Payer: Self-pay

## 2021-11-11 ENCOUNTER — Other Ambulatory Visit (HOSPITAL_COMMUNITY): Payer: Self-pay

## 2021-11-13 ENCOUNTER — Other Ambulatory Visit (HOSPITAL_COMMUNITY): Payer: Self-pay

## 2021-11-14 ENCOUNTER — Other Ambulatory Visit (HOSPITAL_COMMUNITY): Payer: Self-pay

## 2021-11-15 ENCOUNTER — Ambulatory Visit: Payer: Medicare Other

## 2021-11-19 ENCOUNTER — Telehealth: Payer: Self-pay

## 2021-11-19 NOTE — Telephone Encounter (Signed)
Pt lost her monitor. Monitor ordered, she should receive it in 7-10 business days.

## 2021-11-21 ENCOUNTER — Ambulatory Visit (INDEPENDENT_AMBULATORY_CARE_PROVIDER_SITE_OTHER): Payer: Medicare Other

## 2021-11-21 DIAGNOSIS — I428 Other cardiomyopathies: Secondary | ICD-10-CM | POA: Diagnosis not present

## 2021-11-25 LAB — CUP PACEART REMOTE DEVICE CHECK
Battery Remaining Longevity: 90 mo
Battery Voltage: 2.98 V
Brady Statistic AP VP Percent: 0.03 %
Brady Statistic AP VS Percent: 0.01 %
Brady Statistic AS VP Percent: 98.79 %
Brady Statistic AS VS Percent: 1.18 %
Brady Statistic RA Percent Paced: 0.04 %
Brady Statistic RV Percent Paced: 26.28 %
Date Time Interrogation Session: 20231029191515
HighPow Impedance: 63 Ohm
Implantable Lead Connection Status: 753985
Implantable Lead Connection Status: 753985
Implantable Lead Connection Status: 753985
Implantable Lead Implant Date: 20141023
Implantable Lead Implant Date: 20141023
Implantable Lead Implant Date: 20141023
Implantable Lead Location: 753858
Implantable Lead Location: 753859
Implantable Lead Location: 753860
Implantable Lead Model: 4298
Implantable Lead Model: 5076
Implantable Lead Model: 6935
Implantable Pulse Generator Implant Date: 20221027
Lead Channel Impedance Value: 160.941
Lead Channel Impedance Value: 160.941
Lead Channel Impedance Value: 171 Ohm
Lead Channel Impedance Value: 171 Ohm
Lead Channel Impedance Value: 171 Ohm
Lead Channel Impedance Value: 304 Ohm
Lead Channel Impedance Value: 342 Ohm
Lead Channel Impedance Value: 342 Ohm
Lead Channel Impedance Value: 342 Ohm
Lead Channel Impedance Value: 361 Ohm
Lead Channel Impedance Value: 418 Ohm
Lead Channel Impedance Value: 418 Ohm
Lead Channel Impedance Value: 513 Ohm
Lead Channel Impedance Value: 532 Ohm
Lead Channel Impedance Value: 532 Ohm
Lead Channel Impedance Value: 551 Ohm
Lead Channel Impedance Value: 551 Ohm
Lead Channel Impedance Value: 589 Ohm
Lead Channel Pacing Threshold Amplitude: 0.5 V
Lead Channel Pacing Threshold Amplitude: 0.5 V
Lead Channel Pacing Threshold Amplitude: 1.75 V
Lead Channel Pacing Threshold Pulse Width: 0.4 ms
Lead Channel Pacing Threshold Pulse Width: 0.4 ms
Lead Channel Pacing Threshold Pulse Width: 0.4 ms
Lead Channel Sensing Intrinsic Amplitude: 14.875 mV
Lead Channel Sensing Intrinsic Amplitude: 14.875 mV
Lead Channel Sensing Intrinsic Amplitude: 2.5 mV
Lead Channel Sensing Intrinsic Amplitude: 2.5 mV
Lead Channel Setting Pacing Amplitude: 1.5 V
Lead Channel Setting Pacing Amplitude: 2.5 V
Lead Channel Setting Pacing Amplitude: 2.75 V
Lead Channel Setting Pacing Pulse Width: 0.4 ms
Lead Channel Setting Pacing Pulse Width: 0.4 ms
Lead Channel Setting Sensing Sensitivity: 0.45 mV
Zone Setting Status: 755011
Zone Setting Status: 755011

## 2021-11-26 DIAGNOSIS — Z0000 Encounter for general adult medical examination without abnormal findings: Secondary | ICD-10-CM | POA: Diagnosis not present

## 2021-11-26 DIAGNOSIS — I4891 Unspecified atrial fibrillation: Secondary | ICD-10-CM | POA: Diagnosis not present

## 2021-11-26 DIAGNOSIS — R0602 Shortness of breath: Secondary | ICD-10-CM | POA: Diagnosis not present

## 2021-11-26 DIAGNOSIS — Z1231 Encounter for screening mammogram for malignant neoplasm of breast: Secondary | ICD-10-CM | POA: Diagnosis not present

## 2021-11-26 DIAGNOSIS — I739 Peripheral vascular disease, unspecified: Secondary | ICD-10-CM | POA: Diagnosis not present

## 2021-11-26 DIAGNOSIS — E119 Type 2 diabetes mellitus without complications: Secondary | ICD-10-CM | POA: Diagnosis not present

## 2021-11-26 DIAGNOSIS — N183 Chronic kidney disease, stage 3 unspecified: Secondary | ICD-10-CM | POA: Diagnosis not present

## 2021-11-26 DIAGNOSIS — E1122 Type 2 diabetes mellitus with diabetic chronic kidney disease: Secondary | ICD-10-CM | POA: Diagnosis not present

## 2021-12-02 NOTE — Progress Notes (Signed)
Remote ICD transmission.   

## 2021-12-10 ENCOUNTER — Other Ambulatory Visit (HOSPITAL_COMMUNITY): Payer: Self-pay

## 2021-12-13 ENCOUNTER — Other Ambulatory Visit (HOSPITAL_COMMUNITY): Payer: Self-pay

## 2021-12-16 ENCOUNTER — Other Ambulatory Visit (HOSPITAL_COMMUNITY): Payer: Self-pay

## 2021-12-24 ENCOUNTER — Other Ambulatory Visit (HOSPITAL_COMMUNITY): Payer: Self-pay

## 2021-12-28 ENCOUNTER — Emergency Department (HOSPITAL_COMMUNITY)
Admission: EM | Admit: 2021-12-28 | Discharge: 2021-12-29 | Disposition: A | Discharge status: Home / Self Care | Payer: Medicare Other | Attending: Emergency Medicine | Admitting: Emergency Medicine

## 2021-12-28 ENCOUNTER — Other Ambulatory Visit: Payer: Self-pay

## 2021-12-28 DIAGNOSIS — Z20822 Contact with and (suspected) exposure to covid-19: Secondary | ICD-10-CM | POA: Diagnosis not present

## 2021-12-28 DIAGNOSIS — I2510 Atherosclerotic heart disease of native coronary artery without angina pectoris: Secondary | ICD-10-CM | POA: Diagnosis not present

## 2021-12-28 DIAGNOSIS — I5042 Chronic combined systolic (congestive) and diastolic (congestive) heart failure: Secondary | ICD-10-CM | POA: Insufficient documentation

## 2021-12-28 DIAGNOSIS — Z7901 Long term (current) use of anticoagulants: Secondary | ICD-10-CM | POA: Diagnosis not present

## 2021-12-28 DIAGNOSIS — Z95810 Presence of automatic (implantable) cardiac defibrillator: Secondary | ICD-10-CM | POA: Diagnosis not present

## 2021-12-28 DIAGNOSIS — E119 Type 2 diabetes mellitus without complications: Secondary | ICD-10-CM | POA: Insufficient documentation

## 2021-12-28 DIAGNOSIS — Z794 Long term (current) use of insulin: Secondary | ICD-10-CM | POA: Insufficient documentation

## 2021-12-28 DIAGNOSIS — I110 Hypertensive heart disease with heart failure: Secondary | ICD-10-CM | POA: Diagnosis not present

## 2021-12-28 DIAGNOSIS — J101 Influenza due to other identified influenza virus with other respiratory manifestations: Secondary | ICD-10-CM | POA: Diagnosis not present

## 2021-12-28 DIAGNOSIS — Z79899 Other long term (current) drug therapy: Secondary | ICD-10-CM | POA: Diagnosis not present

## 2021-12-28 DIAGNOSIS — R059 Cough, unspecified: Secondary | ICD-10-CM | POA: Diagnosis present

## 2021-12-28 LAB — RESP PANEL BY RT-PCR (FLU A&B, COVID) ARPGX2
Influenza A by PCR: POSITIVE — AB
Influenza B by PCR: NEGATIVE
SARS Coronavirus 2 by RT PCR: NEGATIVE

## 2021-12-28 MED ORDER — ACETAMINOPHEN 325 MG PO TABS
650.0000 mg | ORAL_TABLET | Freq: Once | ORAL | Status: AC
  Administered 2021-12-28: 650 mg via ORAL
  Filled 2021-12-28: qty 2

## 2021-12-28 NOTE — ED Triage Notes (Signed)
Pt with cough that started today. Relatives she had been with last week informed her that they were covid positive. Wanted to be checked out.

## 2021-12-29 MED ORDER — BENZONATATE 100 MG PO CAPS: 100.0000 mg | ORAL_CAPSULE | Freq: Three times a day (TID) | ORAL | 0 refills | Status: DC

## 2021-12-29 MED ORDER — OSELTAMIVIR PHOSPHATE 75 MG PO CAPS: 75.0000 mg | ORAL_CAPSULE | Freq: Two times a day (BID) | ORAL | 0 refills | Status: DC

## 2021-12-29 NOTE — Discharge Instructions (Addendum)
Your work-up today showed that you have flu.  I prescribed Tamiflu for you.  Take Tylenol Motrin as you need to if you develop fever.  I have sent cough medicine in for you as well.  For any concerning symptoms return to the emergency room otherwise follow-up with your primary care provider.

## 2021-12-29 NOTE — ED Provider Notes (Signed)
[facility] EMERGENCY DEPARTMENT Provider Note   CSN: [id number] Arrival date & time: 12/28/21  1851     History  Chief Complaint  Patient presents with   Cough    [name] is a 71 y.o. female.  71 year old female with past medical history significant for HFrEF status post ICD placement who presents today to obtain a COVID test.  She states her family recently came back from [place] and she went out to a show with them couple nights ago.  She states she received a text from them stating they tested positive for COVID and she should get tested.  She states her cough started today.  Denies any other complaints.  Cough is dry.  No chest pain, shortness of breath, or other complaints.  The history is provided by the patient. No language interpreter was used.       Home Medications Prior to Admission medications   Medication Sig Start Date End Date Taking? Authorizing Provider  benzonatate (TESSALON) 100 MG capsule Take 1 capsule (100 mg total) by mouth every 8 (eight) hours. 12/29/21  Yes Deatra Canter, Chip Canepa, PA-C  oseltamivir (TAMIFLU) 75 MG capsule Take 1 capsule (75 mg total) by mouth every 12 (twelve) hours. 12/29/21  Yes Deatra Canter, Brannen Koppen, PA-C  apixaban (ELIQUIS) 5 MG TABS tablet TAKE 1 TABLET(5 MG) BY MOUTH TWICE DAILY 08/31/20   Larey Dresser, MD  atorvastatin (LIPITOR) 40 MG tablet Take 1 tablet by mouth once daily 02/14/21   Larey Dresser, MD  carvedilol (COREG) 25 MG tablet TAKE ONE TABLET BY MOUTH TWICE A DAY WITH MEALS 01/03/21   Larey Dresser, MD  celecoxib (CELEBREX) 200 MG capsule Take by mouth. 09/02/21   [provider]  ENTRESTO 97-103 MG TAKE ONE TABLET BY MOUTH TWICE A DAY 01/03/21   Larey Dresser, MD  gabapentin (NEURONTIN) 100 MG capsule Take 100 mg by mouth 3 (three) times daily. 03/10/18   [provider]  glucose blood (FREESTYLE TEST STRIPS) test strip Use as instructed 04/25/16   Ghimire, Henreitta Leber, MD  glucose monitoring kit  (FREESTYLE) monitoring kit 1 each by Does not apply route 4 (four) times daily - after meals and at bedtime. 1 month Diabetic Testing Supplies for QAC-QHS accuchecks. 04/25/16   Ghimire, Henreitta Leber, MD  HUMALOG KWIKPEN 100 UNIT/ML KwikPen Sliding scale 02/12/18   [provider]  insulin glargine (LANTUS) 100 UNIT/ML Solostar Pen Inject 50 Units into the skin at bedtime.    [provider]  Insulin Pen Needle 32G X 8 MM MISC Use as directed 06/18/16   Arbutus Leas, NP  isosorbide-hydrALAZINE (BIDIL) 20-37.5 MG tablet TAKE 2 TABLETS BY MOUTH 3 TIMES  DAILY ; NEEDS APPOINTMENT FOR  MORE REFILLS 05/20/21   Larey Dresser, MD  Lancets (FREESTYLE) lancets Use as instructed 06/18/16   Arbutus Leas, NP  metFORMIN (GLUMETZA) 1000 MG (MOD) 24 hr tablet Take 1,000 mg by mouth in the morning and at bedtime.    [provider]  oxyCODONE (ROXICODONE) 5 MG immediate release tablet Take 1 tablet (5 mg total) by mouth every 4 (four) hours as needed for severe pain. 06/14/21   Smoot, Sarah A, PA-C  potassium chloride (KLOR-CON M) 10 MEQ tablet Take 10 mEq by mouth 2 (two) times daily.    [provider]  spironolactone (ALDACTONE) 25 MG tablet TAKE 1 TABLET(25 MG) BY MOUTH DAILY 08/15/20   Rafael Bihari, FNP  Tafamidis [facility]) [phone]  MG CAPS TAKE 1 CAPSULE (61 MG) BY MOUTH DAILY. 10/14/21 10/14/22  Larey Dresser, MD  torsemide (DEMADEX) 100 MG tablet Take 50 mg by mouth daily. 09/17/21   [provider]      Allergies    Morphine and related and Penicillins    Review of Systems   Review of Systems  Constitutional:  Negative for fever.  HENT:  Negative for congestion.   Respiratory:  Positive for cough. Negative for shortness of breath.   Cardiovascular:  Negative for chest pain, palpitations and leg swelling.  Gastrointestinal:  Negative for abdominal pain and nausea.  Neurological:  Negative for light-headedness and headaches.  All other systems reviewed and are  negative.   Physical Exam Updated Vital Signs BP (!) 147/66 (BP Location: Right Arm)   Pulse (!) 103   Temp 99.4 F (37.4 C) (Oral)   Resp 18   SpO2 96%  Physical Exam Vitals and nursing note reviewed.  Constitutional:      General: She is not in acute distress.    Appearance: Normal appearance. She is not ill-appearing.  HENT:     Head: Normocephalic and atraumatic.     Nose: Nose normal.  Eyes:     General: No scleral icterus.    Extraocular Movements: Extraocular movements intact.     Conjunctiva/sclera: Conjunctivae normal.  Cardiovascular:     Rate and Rhythm: Regular rhythm. Tachycardia present.     Pulses: Normal pulses.  Pulmonary:     Effort: Pulmonary effort is normal. No respiratory distress.     Breath sounds: Normal breath sounds. No wheezing or rales.  Abdominal:     General: There is no distension.     Palpations: Abdomen is soft.     Tenderness: There is no abdominal tenderness. There is no guarding.  Musculoskeletal:        General: Normal range of motion.     Cervical back: Normal range of motion.     Right lower leg: No edema.     Left lower leg: No edema.  Skin:    General: Skin is warm and dry.  Neurological:     General: No focal deficit present.     Mental Status: She is alert and oriented to person, place, and time. Mental status is at baseline.     ED Results / Procedures / Treatments   Labs (all labs ordered are listed, but only abnormal results are displayed) Labs Reviewed  RESP PANEL BY RT-PCR (FLU A&B, COVID) ARPGX2 - Abnormal; Notable for the following components:      Result Value   Influenza A by PCR POSITIVE (*)    All other components within normal limits    EKG None  Radiology No results found.  Procedures Procedures    Medications Ordered in ED Medications  acetaminophen (TYLENOL) tablet 650 mg (650 mg Oral Given 12/28/21 2052)    ED Course/ Medical Decision Making/ A&P                           Medical  Decision Making Risk Prescription drug management.   Medical Decision Making / ED Course   This patient presents to the ED for concern of cough, this involves an extensive number of treatment options, and is a complaint that carries with it a high risk of complications and morbidity.  The differential diagnosis includes viral URI, pneumonia, sinusitis  MDM: 71 year old female presents with above-mentioned complaints.  Overall  she is well-appearing.  Does have a dry cough during exam.  Symptoms started today.  She is concerned she may have COVID given the recent positive contact with family.  Lung sounds are clear.  Without chest pain or shortness of breath.  Mild tachycardia.  Patient is positive for influenza A.  Will prescribe Tamiflu.  Symptomatic management discussed.  Will give [employer]  Patient is appropriate for discharge.  Discharged in stable condition.  Most recent renal function showed creatinine 1.08.  Will prescribe full dose of Tamiflu.  Return precautions discussed.  Patient voices understanding and is in agreement with plan.  Patient discussed with attending.  Low suspicion for pneumonia given cough is nonproductive.  Patient is afebrile.  And she is without shortness of breath.   Lab Tests: -I ordered, reviewed, and interpreted labs.   The pertinent results include:   Labs Reviewed  RESP PANEL BY RT-PCR (FLU A&B, COVID) ARPGX2 - Abnormal; Notable for the following components:      Result Value   Influenza A by PCR POSITIVE (*)    All other components within normal limits      EKG  EKG Interpretation  Date/Time:    Ventricular Rate:    PR Interval:    QRS Duration:   QT Interval:    QTC Calculation:   R Axis:     Text Interpretation:           Medicines ordered and prescription drug management: Meds ordered this encounter  Medications   acetaminophen (TYLENOL) tablet 650 mg   oseltamivir (TAMIFLU) 75 MG capsule    Sig: Take 1 capsule (75 mg  total) by mouth every 12 (twelve) hours.    Dispense:  10 capsule    Refill:  0    Order Specific Question:   Supervising Provider    Answer:   MILLER, BRIAN [[id number]]   benzonatate (TESSALON) 100 MG capsule    Sig: Take 1 capsule (100 mg total) by mouth every 8 (eight) hours.    Dispense:  21 capsule    Refill:  0    Order Specific Question:   Supervising Provider    Answer:   MILLER, Tukwila    -I have reviewed the patients home medicines and have made adjustments as needed  Co morbidities that complicate the patient evaluation  Past Medical History:  Diagnosis Date   Anemia    a. Noted on 07/2012 labs, instructed to f/u PCP.   Arthritis    "joints" (11/18/2012)   CAD (coronary artery disease), native coronary artery    a. Nonobstructive by cath 02/2012 (done because of low EF).   Chronic bronchitis ([place])    "~ every other year" (11/18/2012)   Chronic combined systolic and diastolic CHF (congestive heart failure) ([place])    a. 03/05/12 echo:  LVEF 20-25%, moderate LVH , inferior and basal to mid septal akinesis, anterior moderate to severe hypokinesis and grade 2 diastolic dysfunction. b. EF 07/2012: EF still 25% (unclear medication compliance).   Chronic lower back pain    Headache(784.0)    "often; maybe not daily" (11/18/2012)   High cholesterol    History of noncompliance with medical treatment    Hypertension    LBBB (left bundle branch block)    Orthopnea    Tobacco abuse    Type II diabetes mellitus ([place])       Dispostion: Patient is appropriate for discharge.  Discharged in stable condition.  Return precautions discussed.  Patient voices  understanding and is in agreement with plan.  Final Clinical Impression(s) / ED Diagnoses Final diagnoses:  Influenza A    Rx / DC Orders ED Discharge Orders          Ordered    oseltamivir (TAMIFLU) 75 MG capsule  Every 12 hours        12/29/21 0047    benzonatate (TESSALON) 100 MG capsule  Every 8 hours        12/29/21  0048              Evlyn Courier, PA-C 12/29/21 0111    Ripley Fraise, MD 12/29/21 [phone]

## 2022-01-08 NOTE — Progress Notes (Shared)
[facility] Note  01/10/2022     CHIEF COMPLAINT Patient presents for No chief complaint on file.  HISTORY OF PRESENT ILLNESS: [name] is a 71 y.o. female who presents to the clinic today for:      Referring physician: Sandi Mariscal, MD [place],  [facility] [address]  HISTORICAL INFORMATION:   Selected notes from the MEDICAL RECORD NUMBER Referred by Dr. Thurston Hole for concern of BRVO OD / ERM OD LEE: 02.10.20 (S. Bernstorf) [BCVA: OD: 20/25- OS: 20/25] Ocular Hx-DES OU, narrow angles PMH-DM (metformin, novolin, lantus), HTN, HLD, heart disease   CURRENT MEDICATIONS: No current outpatient medications on file. (Ophthalmic Drugs)   No current facility-administered medications for this visit. (Ophthalmic Drugs)   Current Outpatient Medications (Other)  Medication Sig   apixaban (ELIQUIS) 5 MG TABS tablet TAKE 1 TABLET(5 MG) BY MOUTH TWICE DAILY   atorvastatin (LIPITOR) 40 MG tablet Take 1 tablet by mouth once daily   benzonatate (TESSALON) 100 MG capsule Take 1 capsule (100 mg total) by mouth every 8 (eight) hours.   carvedilol (COREG) 25 MG tablet TAKE ONE TABLET BY MOUTH TWICE A DAY WITH MEALS   celecoxib (CELEBREX) 200 MG capsule Take by mouth.   ENTRESTO 97-103 MG TAKE ONE TABLET BY MOUTH TWICE A DAY   gabapentin (NEURONTIN) 100 MG capsule Take 100 mg by mouth 3 (three) times daily.   glucose blood (FREESTYLE TEST STRIPS) test strip Use as instructed   glucose monitoring kit (FREESTYLE) monitoring kit 1 each by Does not apply route 4 (four) times daily - after meals and at bedtime. 1 month Diabetic Testing Supplies for QAC-QHS accuchecks.   HUMALOG KWIKPEN 100 UNIT/ML KwikPen Sliding scale   insulin glargine (LANTUS) 100 UNIT/ML Solostar Pen Inject 50 Units into the skin at bedtime.   Insulin Pen Needle 32G X 8 MM MISC Use as directed   isosorbide-hydrALAZINE (BIDIL) 20-37.5 MG tablet TAKE 2 TABLETS BY MOUTH 3 TIMES   DAILY ; NEEDS APPOINTMENT FOR  MORE REFILLS   Lancets (FREESTYLE) lancets Use as instructed   metFORMIN (GLUMETZA) 1000 MG (MOD) 24 hr tablet Take 1,000 mg by mouth in the morning and at bedtime.   oseltamivir (TAMIFLU) 75 MG capsule Take 1 capsule (75 mg total) by mouth every 12 (twelve) hours.   oxyCODONE (ROXICODONE) 5 MG immediate release tablet Take 1 tablet (5 mg total) by mouth every 4 (four) hours as needed for severe pain.   potassium chloride (KLOR-CON M) 10 MEQ tablet Take 10 mEq by mouth 2 (two) times daily.   spironolactone (ALDACTONE) 25 MG tablet TAKE 1 TABLET(25 MG) BY MOUTH DAILY   Tafamidis (VYNDAMAX) 61 MG CAPS TAKE 1 CAPSULE (61 MG) BY MOUTH DAILY.   torsemide (DEMADEX) 100 MG tablet Take 50 mg by mouth daily.   No current facility-administered medications for this visit. (Other)   REVIEW OF SYSTEMS:   ALLERGIES Allergies  Allergen Reactions   Morphine And Related Nausea Only    Severe nausea   Penicillins Other (See Comments)    Pt states she has had a pain in her leg since a penicillin injection 2 months ago (reported 08/31/17).  Has tolerated amoxicillin oral.  Has patient had a PCN reaction causing immediate rash, facial/tongue/throat swelling, SOB or lightheadedness with hypotension: No Has patient had a PCN reaction causing severe rash involving mucus membranes or skin necrosis: No Has patient had a PCN reaction that required hospitalization: No Has  patient had a PCN reaction occurring within the last 10 years: Yes If a   PAST MEDICAL HISTORY Past Medical History:  Diagnosis Date   Anemia    a. Noted on 07/2012 labs, instructed to f/u PCP.   Arthritis    "joints" (11/18/2012)   CAD (coronary artery disease), native coronary artery    a. Nonobstructive by cath 02/2012 (done because of low EF).   Chronic bronchitis ([place])    "~ every other year" (11/18/2012)   Chronic combined systolic and diastolic CHF (congestive heart failure) ([place])    a. 03/05/12 echo:   LVEF 20-25%, moderate LVH , inferior and basal to mid septal akinesis, anterior moderate to severe hypokinesis and grade 2 diastolic dysfunction. b. EF 07/2012: EF still 25% (unclear medication compliance).   Chronic lower back pain    Headache(784.0)    "often; maybe not daily" (11/18/2012)   High cholesterol    History of noncompliance with medical treatment    Hypertension    LBBB (left bundle branch block)    Orthopnea    Tobacco abuse    Type II diabetes mellitus ([place])    Past Surgical History:  Procedure Laterality Date   BI-VENTRICULAR IMPLANTABLE CARDIOVERTER DEFIBRILLATOR N/A 11/18/2012   Procedure: BI-VENTRICULAR IMPLANTABLE CARDIOVERTER DEFIBRILLATOR  (CRT-D);  Surgeon: Evans Lance, MD;  Location: [facility] CATH LAB;  Service: Cardiovascular;  Laterality: N/A;   BI-VENTRICULAR IMPLANTABLE CARDIOVERTER DEFIBRILLATOR  (CRT-D)  11/18/2012   BIV ICD GENERATOR CHANGEOUT N/A 11/22/2020   Procedure: BIV ICD GENERATOR CHANGEOUT;  Surgeon: Evans Lance, MD;  Location: [facility] CV LAB;  Service: Cardiovascular;  Laterality: N/A;   CARDIAC CATHETERIZATION  03/04/12   nonobstructive CAD, elevated LVEDP and tortuous vessels suggestive of long-standing hypertension   COLONOSCOPY WITH PROPOFOL N/A 12/12/2016   Procedure: COLONOSCOPY WITH PROPOFOL;  Surgeon: Carol Ada, MD;  Location: [facility] ENDOSCOPY;  Service: Endoscopy;  Laterality: N/A;   JOINT REPLACEMENT     Bilateral hip and right knee   LEFT HEART CATH N/A 03/05/2012   Procedure: LEFT HEART CATH;  Surgeon: Larey Dresser, MD;  Location[facility] C[facility] LAB;  Service: Cardiovascular;  Laterality: N/A;   FAMILY HISTORY Family History  Problem Relation Age of Onset   Heart failure Mother    Heart disease Neg Hx    SOCIAL HISTORY Social History   Tobacco Use   Smoking status: Former    Packs/day: 1.00    Years: 40.00    Total pack years: 40.00    Types: Cigarettes    Start date: 06/23/1972    Quit date: 03/26/2012    Years since  quitting: 9.7   Smokeless tobacco: Never  Vaping Use   Vaping Use: Never used  Substance Use Topics   Alcohol use: No   Drug use: No       OPHTHALMIC EXAM:  Not recorded    IMAGING AND PROCEDURES  Imaging and Procedures for _0 @          ASSESSMENT/PLAN:    ICD-10-CM   1. Proliferative retinopathy of right eye  H35.21     2. BRAO (branch retinal artery occlusion), right  H34.231     3. Epiretinal membrane (ERM) of right eye  H35.371     4. Moderate nonproliferative diabetic retinopathy of both eyes without macular edema associated with type 2 diabetes mellitus ([place])  K34.9179     5. Essential hypertension  I10     6. Hypertensive retinopathy of both eyes  H35.033  7. Pseudophakia of both eyes  Z96.1      1,2. BRAO w/ proliferative retinopathy -- stable  - sclerotic superior arteriole with distal fibrotic NV (?sea fan) and overlying preretinal and vitreous heme -- improved  - etiology could also be DM2 vs sickle cell retinopathy -- sickle cell screen negative  - s/p segmental PRP OD (2.17.2020) + fill-in PRP OD 7.13.20 - good laser changes in place  - repeat FA (3.12.21) shows interval improvement in NV and leakage  - improved view post cataract surgery - no retinal or ophthalmic interventions indicated or recommended   - f/u 12 months, DFE/OCT  3. Epiretinal membrane, OD -- stable  - thick ERM overlying retina with pucker and distorted foveal profile  - BCVA 20/40 w/ Mrx to 20/20 today             - OCT shows thick ERM -- stable from prior  - discussed finding and prognosis and treatment options in detail  - pt happy with vision OD and wishes to continue to monitor ERM for now  - f/u 12 months, sooner prn  4. Moderate nonproliferative diabetic retinopathy w/o DME, OU  - exam shows scattered MA OU, no NV  - FA (02.17.20) shows no NV OS  - OCT without diabetic macular edema, OU  - monitor   5,6. Hypertensive retinopathy OU  - discussed importance  of tight BP control  - monitor   7. Pseudophakia OU  - s/p CE/IOL (OD: Dr. Shirleen Schirmer, Jun 10, 2018, OS: Dr. Shirleen Schirmer, September 16, 2018 )  - beautiful surgeries, doing well  - monitor   Ophthalmic Meds Ordered this visit:  No orders of the defined types were placed in this encounter.    No follow-ups on file.  There are no Patient Instructions on file for this visit.  Explained the diagnoses, plan, and follow up with the patient and they expressed understanding.  Patient expressed understanding of the importance of proper follow up care.   This document serves as a record of services personally performed by Gardiner Sleeper, MD, PhD. It was created on their behalf by San Jetty. Owens Shark, OA an ophthalmic technician. The creation of this record is the provider's dictation and/or activities during the visit.    Electronically signed by: San Jetty. Owens Shark, [place] 12.13.2023 7:59 AM   Gardiner Sleeper, M.D., Ph.D. Diseases & Surgery of the Retina and Vitreous [facility]: M myopia (nearsighted); A astigmatism; H hyperopia (farsighted); P presbyopia; Mrx spectacle prescription;  CTL contact lenses; OD right eye; OS left eye; OU both eyes  XT exotropia; ET esotropia; PEK punctate epithelial keratitis; PEE punctate epithelial erosions; DES dry eye syndrome; MGD meibomian gland dysfunction; ATs artificial tears; PFAT's preservative free artificial tears; [place] nuclear sclerotic cataract; PSC posterior subcapsular cataract; ERM epi-retinal membrane; PVD posterior vitreous detachment; RD retinal detachment; DM diabetes mellitus; DR diabetic retinopathy; NPDR non-proliferative diabetic retinopathy; PDR proliferative diabetic retinopathy; CSME clinically significant macular edema; DME diabetic macular edema; dbh dot blot hemorrhages; CWS cotton wool spot; POAG primary open angle glaucoma; C/D cup-to-disc ratio; HVF humphrey visual field; GVF goldmann visual field; OCT optical  coherence tomography; IOP intraocular pressure; BRVO Branch retinal vein occlusion; CRVO central retinal vein occlusion; CRAO central retinal artery occlusion; BRAO branch retinal artery occlusion; RT retinal tear; SB scleral buckle; PPV pars plana vitrectomy; VH Vitreous hemorrhage; PRP panretinal laser photocoagulation; IVK intravitreal kenalog; VMT vitreomacular traction; MH Macular hole;  NVD neovascularization of the disc; NVE neovascularization elsewhere; AREDS age related eye disease study; ARMD age related macular degeneration; POAG primary open angle glaucoma; EBMD epithelial/anterior basement membrane dystrophy; ACIOL anterior chamber intraocular lens; IOL intraocular lens; PCIOL posterior chamber intraocular lens; Phaco/IOL phacoemulsification with intraocular lens placement; [place] photorefractive keratectomy; LASIK laser assisted in situ keratomileusis; HTN hypertension; DM diabetes mellitus; COPD chronic obstructive pulmonary disease

## 2022-01-09 ENCOUNTER — Ambulatory Visit: Payer: Medicare Other

## 2022-01-10 ENCOUNTER — Ambulatory Visit: Payer: Medicare Other

## 2022-01-10 ENCOUNTER — Encounter (INDEPENDENT_AMBULATORY_CARE_PROVIDER_SITE_OTHER): Payer: Self-pay

## 2022-01-10 ENCOUNTER — Encounter (INDEPENDENT_AMBULATORY_CARE_PROVIDER_SITE_OTHER): Payer: Medicare Other | Admitting: Ophthalmology

## 2022-01-10 DIAGNOSIS — H3521 Other non-diabetic proliferative retinopathy, right eye: Secondary | ICD-10-CM

## 2022-01-10 DIAGNOSIS — E113393 Type 2 diabetes mellitus with moderate nonproliferative diabetic retinopathy without macular edema, bilateral: Secondary | ICD-10-CM

## 2022-01-10 DIAGNOSIS — H35033 Hypertensive retinopathy, bilateral: Secondary | ICD-10-CM

## 2022-01-10 DIAGNOSIS — H34231 Retinal artery branch occlusion, right eye: Secondary | ICD-10-CM

## 2022-01-10 DIAGNOSIS — Z961 Presence of intraocular lens: Secondary | ICD-10-CM

## 2022-01-10 DIAGNOSIS — H35371 Puckering of macula, right eye: Secondary | ICD-10-CM

## 2022-01-10 DIAGNOSIS — I10 Essential (primary) hypertension: Secondary | ICD-10-CM

## 2022-01-14 ENCOUNTER — Other Ambulatory Visit (HOSPITAL_COMMUNITY): Payer: Self-pay

## 2022-01-15 ENCOUNTER — Other Ambulatory Visit: Payer: Self-pay

## 2022-01-16 ENCOUNTER — Other Ambulatory Visit: Payer: Self-pay

## 2022-01-16 ENCOUNTER — Other Ambulatory Visit (HOSPITAL_COMMUNITY): Payer: Self-pay

## 2022-02-10 ENCOUNTER — Other Ambulatory Visit (HOSPITAL_COMMUNITY): Payer: Self-pay

## 2022-02-12 ENCOUNTER — Other Ambulatory Visit (HOSPITAL_COMMUNITY): Payer: Self-pay

## 2022-02-12 NOTE — Progress Notes (Signed)
[facility] Note  02/14/2022     CHIEF COMPLAINT Patient presents for Retina Follow Up  HISTORY OF PRESENT ILLNESS: [name] is a 72 y.o. female who presents to the clinic today for:   HPI     Retina Follow Up   Patient presents with  Diabetic Retinopathy.  In right eye.  This started years ago.  Severity is moderate.  Duration of 12 months.  Since onset it is stable.  I, the attending physician,  performed the HPI with the patient and updated documentation appropriately.        Comments   Patient feels that the vision is getting worse. She is not using any eye drops at this time. Her blood sugar was 100?      Last edited by Bernarda Caffey, MD on 02/15/2022  3:01 AM.     Referring physician: Sandi Mariscal, MD [place],  [place] [address]  HISTORICAL INFORMATION:   Selected notes from the MEDICAL RECORD NUMBER Referred by Dr. Thurston Hole for concern of BRVO OD / ERM OD LEE: 02.10.20 (S. Bernstorf) [BCVA: OD: 20/25- OS: 20/25] Ocular Hx-DES OU, narrow angles PMH-DM (metformin, novolin, lantus), HTN, HLD, heart disease   CURRENT MEDICATIONS: No current outpatient medications on file. (Ophthalmic Drugs)   No current facility-administered medications for this visit. (Ophthalmic Drugs)   Current Outpatient Medications (Other)  Medication Sig   apixaban (ELIQUIS) 5 MG TABS tablet TAKE 1 TABLET(5 MG) BY MOUTH TWICE DAILY   atorvastatin (LIPITOR) 40 MG tablet Take 1 tablet by mouth once daily   benzonatate (TESSALON) 100 MG capsule Take 1 capsule (100 mg total) by mouth every 8 (eight) hours.   carvedilol (COREG) 25 MG tablet TAKE ONE TABLET BY MOUTH TWICE A DAY WITH MEALS   celecoxib (CELEBREX) 200 MG capsule Take by mouth.   ENTRESTO 97-103 MG TAKE ONE TABLET BY MOUTH TWICE A DAY   gabapentin (NEURONTIN) 100 MG capsule Take 100 mg by mouth 3 (three) times daily.   glucose blood (FREESTYLE TEST STRIPS) test strip Use as  instructed   glucose monitoring kit (FREESTYLE) monitoring kit 1 each by Does not apply route 4 (four) times daily - after meals and at bedtime. 1 month Diabetic Testing Supplies for QAC-QHS accuchecks.   HUMALOG KWIKPEN 100 UNIT/ML KwikPen Sliding scale   insulin glargine (LANTUS) 100 UNIT/ML Solostar Pen Inject 50 Units into the skin at bedtime.   Insulin Pen Needle 32G X 8 MM MISC Use as directed   isosorbide-hydrALAZINE (BIDIL) 20-37.5 MG tablet TAKE 2 TABLETS BY MOUTH 3 TIMES  DAILY ; NEEDS APPOINTMENT FOR  MORE REFILLS   Lancets (FREESTYLE) lancets Use as instructed   metFORMIN (GLUMETZA) 1000 MG (MOD) 24 hr tablet Take 1,000 mg by mouth in the morning and at bedtime.   oseltamivir (TAMIFLU) 75 MG capsule Take 1 capsule (75 mg total) by mouth every 12 (twelve) hours.   oxyCODONE (ROXICODONE) 5 MG immediate release tablet Take 1 tablet (5 mg total) by mouth every 4 (four) hours as needed for severe pain.   potassium chloride (KLOR-CON M) 10 MEQ tablet Take 10 mEq by mouth 2 (two) times daily.   spironolactone (ALDACTONE) 25 MG tablet TAKE 1 TABLET(25 MG) BY MOUTH DAILY   Tafamidis (VYNDAMAX) 61 MG CAPS TAKE 1 CAPSULE (61 MG) BY MOUTH DAILY.   torsemide (DEMADEX) 100 MG tablet Take 50 mg by mouth daily.   No current facility-administered medications for this  visit. (Other)   REVIEW OF SYSTEMS: ROS   Positive for: Endocrine, Cardiovascular, Eyes, Respiratory Negative for: Constitutional, Gastrointestinal, Neurological, Skin, Genitourinary, Musculoskeletal, HENT, Psychiatric, Allergic/Imm, Heme/Lymph Last edited by Annie Paras, COT on 02/14/2022  2:02 PM.     ALLERGIES Allergies  Allergen Reactions   Morphine And Related Nausea Only    Severe nausea   Penicillins Other (See Comments)    Pt states she has had a pain in her leg since a penicillin injection 2 months ago (reported 08/31/17).  Has tolerated amoxicillin oral.  Has patient had a PCN reaction causing immediate rash,  facial/tongue/throat swelling, SOB or lightheadedness with hypotension: No Has patient had a PCN reaction causing severe rash involving mucus membranes or skin necrosis: No Has patient had a PCN reaction that required hospitalization: No Has patient had a PCN reaction occurring within the last 10 years: Yes If a   PAST MEDICAL HISTORY Past Medical History:  Diagnosis Date   Anemia    a. Noted on 07/2012 labs, instructed to f/u PCP.   Arthritis    "joints" (11/18/2012)   CAD (coronary artery disease), native coronary artery    a. Nonobstructive by cath 02/2012 (done because of low EF).   Chronic bronchitis ([place])    "~ every other year" (11/18/2012)   Chronic combined systolic and diastolic CHF (congestive heart failure) ([place])    a. 03/05/12 echo:  LVEF 20-25%, moderate LVH , inferior and basal to mid septal akinesis, anterior moderate to severe hypokinesis and grade 2 diastolic dysfunction. b. EF 07/2012: EF still 25% (unclear medication compliance).   Chronic lower back pain    Headache(784.0)    "often; maybe not daily" (11/18/2012)   High cholesterol    History of noncompliance with medical treatment    Hypertension    LBBB (left bundle branch block)    Orthopnea    Tobacco abuse    Type II diabetes mellitus ([place])    Past Surgical History:  Procedure Laterality Date   BI-VENTRICULAR IMPLANTABLE CARDIOVERTER DEFIBRILLATOR N/A 11/18/2012   Procedure: BI-VENTRICULAR IMPLANTABLE CARDIOVERTER DEFIBRILLATOR  (CRT-D);  Surgeon: Evans Lance, MD;  Location: [facility] CATH LAB;  Service: Cardiovascular;  Laterality: N/A;   BI-VENTRICULAR IMPLANTABLE CARDIOVERTER DEFIBRILLATOR  (CRT-D)  11/18/2012   BIV ICD GENERATOR CHANGEOUT N/A 11/22/2020   Procedure: BIV ICD GENERATOR CHANGEOUT;  Surgeon: Evans Lance, MD;  Location: [facility] CV LAB;  Service: Cardiovascular;  Laterality: N/A;   CARDIAC CATHETERIZATION  03/04/12   nonobstructive CAD, elevated LVEDP and tortuous vessels suggestive of  long-standing hypertension   COLONOSCOPY WITH PROPOFOL N/A 12/12/2016   Procedure: COLONOSCOPY WITH PROPOFOL;  Surgeon: Carol Ada, MD;  Location: [facility] ENDOSCOPY;  Service: Endoscopy;  Laterality: N/A;   JOINT REPLACEMENT     Bilateral hip and right knee   LEFT HEART CATH N/A 03/05/2012   Procedure: LEFT HEART CATH;  Surgeon: Larey Dresser, MD;  Location: [facility] CATH LAB;  Service: Cardiovascular;  Laterality: N/A;   FAMILY HISTORY Family History  Problem Relation Age of Onset   Heart failure Mother    Heart disease Neg Hx    SOCIAL HISTORY Social History   Tobacco Use   Smoking status: Former    Packs/day: 1.00    Years: 40.00    Total pack years: 40.00    Types: Cigarettes    Start date: 06/23/1972    Quit date: 03/26/2012    Years since quitting: 9.8   Smokeless tobacco: Never  Vaping Use  Vaping Use: Never used  Substance Use Topics   Alcohol use: No   Drug use: No       OPHTHALMIC EXAM:  Base Eye Exam     Visual Acuity (Snellen - Linear)       Right Left   Dist [place] 20/40 20/25   Dist ph [place] NI          Tonometry (Tonopen, 2:05 PM)       Right Left   Pressure 15 17         Pupils       Dark Light Shape React APD   Right 2 1 Round Minimal None   Left 2 1 Round Minimal None         Visual Fields       Left Right    Full Full         Extraocular Movement       Right Left    Full, Ortho Full, Ortho         Neuro/Psych     Oriented x3: Yes   Mood/Affect: Normal         Dilation     Both eyes: 1.0% Mydriacyl, 2.5% Phenylephrine @ 2:03 PM           Slit Lamp and Fundus Exam     Slit Lamp Exam       Right Left   Lids/Lashes Dermatochalasis - upper lid Dermatochalasis - upper lid   Conjunctiva/Sclera nasal Pinguecula, Melanosis nasal/temporal Pinguecula, Melanosis   Cornea Trace Punctate epithelial erosions, Well healed  IT cataract wounds, mild arcus 1+ fine punctate epithelial erosions, well healed temporal cataract  wounds, arcus   Anterior Chamber deep and clear deep and clear   Iris Round and dilated, No NVI Round and dilated, No NVI   Lens PC IOL in perfect position, 1+ peripheral Posterior capsular opacification PC IOL in good position, 2+ PCO inferiorly   Anterior Vitreous Vitreous syneresis Vitreous syneresis         Fundus Exam       Right Left   Disc +cupping, pallor, mild tilt, temporal PPA, Sharp sharp rim, +cupping, temporal PPA, mild tilt, Pallor   C/D Ratio 0.6 0.7   Macula Flat, Blunted foveal reflex, 3-4+ERM with +striae, no heme Good foveal reflex, stable improvement in cystic changes, rare MA   Vessels Vascular attenuation, veins dilated/tortuous, AV crossing changes, white, sclerotic arteriole / BRAO at 1200 - good laser surrounding Vascular attenuation, Tortuous, Copper wiring   Periphery Attached, fibrotic NV at 1200 -- regressing ?seafan, good segmental PRP laser changes in area of BRAO and NV, focal blot hemes superior periphery - improved;  pigmented cystoid degeneration at 0600, No heme Attached, No heme           IMAGING AND PROCEDURES  Imaging and Procedures for '@TODAY'$ @  OCT, Retina - OU - Both Eyes       Right Eye Quality was good. Central Foveal Thickness: 488. Progression has been stable. Findings include no IRF, no SRF, abnormal foveal contour, epiretinal membrane, macular pucker, preretinal fibrosis (Thick ERM and macular pucker -- stable).   Left Eye Quality was good. Central Foveal Thickness: 235. Progression has been stable. Findings include normal foveal contour, no IRF, no SRF, vitreomacular adhesion .   Notes *Images captured and stored on drive  Diagnosis / Impression:  OD: Thick ERM w/ pucker -- stable OS: NFP, no IRF/SRF, mild VMA   Clinical management:  See below  Abbreviations: NFP - Normal foveal profile. CME - cystoid macular edema. PED - pigment epithelial detachment. IRF - intraretinal fluid. SRF - subretinal fluid. EZ - ellipsoid zone.  ERM - epiretinal membrane. ORA - outer retinal atrophy. ORT - outer retinal tubulation. SRHM - subretinal hyper-reflective material            ASSESSMENT/PLAN:    ICD-10-CM   1. Proliferative retinopathy of right eye  H35.21     2. BRAO (branch retinal artery occlusion), right  H34.231 OCT, Retina - OU - Both Eyes    3. Epiretinal membrane (ERM) of right eye  H35.371 OCT, Retina - OU - Both Eyes    4. Moderate nonproliferative diabetic retinopathy of both eyes without macular edema associated with type 2 diabetes mellitus (HCC)  G81.8563 OCT, Retina - OU - Both Eyes    5. Essential hypertension  I10     6. Hypertensive retinopathy of both eyes  H35.033     7. Pseudophakia of both eyes  Z96.1      1,2. BRAO w/ proliferative retinopathy -- stable  - sclerotic superior arteriole with distal fibrotic NV (?sea fan) and overlying preretinal and vitreous heme stably improved  - etiology could also be DM2 vs sickle cell retinopathy -- sickle cell screen negative  - s/p segmental PRP OD (2.17.2020) + fill-in PRP OD 7.13.20 - good laser changes in place  - repeat FA (3.12.21) shows interval improvement in NV and leakage  - improved view post cataract surgery - no retinal or ophthalmic interventions indicated or recommended   - f/u 12 months, DFE/OCT  3. Epiretinal membrane, OD -- stable  - thick ERM overlying retina with pucker and distorted foveal profile  - BCVA 20/40              - OCT shows thick ERM -- stable from prior  - discussed finding and prognosis and treatment options in detail  - pt happy with vision OD and wishes to continue to monitor ERM for now  - f/u 12 months, sooner prn  4. Moderate nonproliferative diabetic retinopathy w/o DME, OU  - exam shows scattered MA OU, no NV  - FA (02.17.20) shows no NV OS  - OCT without diabetic macular edema, OU  - monitor   5,6. Hypertensive retinopathy OU  - discussed importance of tight BP control             - monitor    7. Pseudophakia OU  - s/p CE/IOL (OD: Dr. Shirleen Schirmer, Jun 10, 2018, OS: Dr. Shirleen Schirmer, September 16, 2018 )  - beautiful surgeries, doing well  - monitor   Ophthalmic Meds Ordered this visit:  No orders of the defined types were placed in this encounter.    Return in about 1 year (around 02/15/2023) for f/u BRAO OD, DFE, OCT.  This document serves as a record of services personally performed by Gardiner Sleeper, MD, PhD. It was created on their behalf by Bernarda Caffey, MD, an ophthalmic technician. The creation of this record is the provider's dictation and/or activities during the visit.    Electronically signed by: Bernarda Caffey, MD 01/17/20243:02 AM  Gardiner Sleeper, M.D., Ph.D. Diseases & Surgery of the Retina and [place] 02/14/2022   I have reviewed the above documentation for accuracy and completeness, and I agree with the above. Gardiner Sleeper, M.D., Ph.D. 02/15/22 3:04 AM   Abbreviations: M myopia (nearsighted); A astigmatism; H hyperopia (farsighted);  P presbyopia; Mrx spectacle prescription;  CTL contact lenses; OD right eye; OS left eye; OU both eyes  XT exotropia; ET esotropia; PEK punctate epithelial keratitis; PEE punctate epithelial erosions; DES dry eye syndrome; MGD meibomian gland dysfunction; ATs artificial tears; PFAT's preservative free artificial tears; [place] nuclear sclerotic cataract; PSC posterior subcapsular cataract; ERM epi-retinal membrane; PVD posterior vitreous detachment; RD retinal detachment; DM diabetes mellitus; DR diabetic retinopathy; NPDR non-proliferative diabetic retinopathy; PDR proliferative diabetic retinopathy; CSME clinically significant macular edema; DME diabetic macular edema; dbh dot blot hemorrhages; CWS cotton wool spot; POAG primary open angle glaucoma; C/D cup-to-disc ratio; HVF humphrey visual field; GVF goldmann visual field; OCT optical coherence tomography; IOP intraocular pressure; BRVO Branch retinal vein  occlusion; CRVO central retinal vein occlusion; CRAO central retinal artery occlusion; BRAO branch retinal artery occlusion; RT retinal tear; SB scleral buckle; PPV pars plana vitrectomy; VH Vitreous hemorrhage; PRP panretinal laser photocoagulation; IVK intravitreal kenalog; VMT vitreomacular traction; MH Macular hole;  NVD neovascularization of the disc; NVE neovascularization elsewhere; AREDS age related eye disease study; ARMD age related macular degeneration; POAG primary open angle glaucoma; EBMD epithelial/anterior basement membrane dystrophy; ACIOL anterior chamber intraocular lens; IOL intraocular lens; PCIOL posterior chamber intraocular lens; Phaco/IOL phacoemulsification with intraocular lens placement; [place] photorefractive keratectomy; LASIK laser assisted in situ keratomileusis; HTN hypertension; DM diabetes mellitus; COPD chronic obstructive pulmonary disease

## 2022-02-14 ENCOUNTER — Other Ambulatory Visit (HOSPITAL_COMMUNITY): Payer: Self-pay

## 2022-02-14 ENCOUNTER — Ambulatory Visit (INDEPENDENT_AMBULATORY_CARE_PROVIDER_SITE_OTHER): Payer: 59 | Admitting: Ophthalmology

## 2022-02-14 DIAGNOSIS — H35371 Puckering of macula, right eye: Secondary | ICD-10-CM

## 2022-02-14 DIAGNOSIS — H34231 Retinal artery branch occlusion, right eye: Secondary | ICD-10-CM | POA: Diagnosis not present

## 2022-02-14 DIAGNOSIS — E113393 Type 2 diabetes mellitus with moderate nonproliferative diabetic retinopathy without macular edema, bilateral: Secondary | ICD-10-CM

## 2022-02-14 DIAGNOSIS — H3521 Other non-diabetic proliferative retinopathy, right eye: Secondary | ICD-10-CM

## 2022-02-14 DIAGNOSIS — I10 Essential (primary) hypertension: Secondary | ICD-10-CM | POA: Diagnosis not present

## 2022-02-14 DIAGNOSIS — Z961 Presence of intraocular lens: Secondary | ICD-10-CM

## 2022-02-14 DIAGNOSIS — H35033 Hypertensive retinopathy, bilateral: Secondary | ICD-10-CM

## 2022-02-15 ENCOUNTER — Encounter (INDEPENDENT_AMBULATORY_CARE_PROVIDER_SITE_OTHER): Payer: Self-pay | Admitting: Ophthalmology

## 2022-02-24 NOTE — Progress Notes (Deleted)
Office Visit Note  Patient: [name]             Date of Birth: 25-Oct-1950           MRN: [id number]             PCP: [facility] Referring: Antony Blackbird, MD Visit Date: 03/06/2022 Occupation: '@GUAROCC'$ @  Subjective:  No chief complaint on file.   History of Present Illness: [name] is a 72 y.o. female ***     Activities of Daily Living:  Patient reports morning stiffness for *** {minute/hour:19697}.   Patient {ACTIONS;DENIES/REPORTS:21021675::"Denies"} nocturnal pain.  Difficulty dressing/grooming: {ACTIONS;DENIES/REPORTS:21021675::"Denies"} Difficulty climbing stairs: {ACTIONS;DENIES/REPORTS:21021675::"Denies"} Difficulty getting out of chair: {ACTIONS;DENIES/REPORTS:21021675::"Denies"} Difficulty using hands for taps, buttons, cutlery, and/or writing: {ACTIONS;DENIES/REPORTS:21021675::"Denies"}  No Rheumatology ROS completed.   PMFS History:  Patient Active Problem List   Diagnosis Date Noted   Right foot infection 10/22/2017   Cellulitis in diabetic foot ([place]) 10/22/2017   Hyperglycemia 10/22/2017   Lumbar radiculopathy 09/18/2017   Degenerative spondylolisthesis 09/18/2017   Atrial fibrillation ([place]) 02/11/2017   Paroxysmal A-fib (HCC)    Biventricular automatic implantable cardioverter defibrillator in situ    Sepsis ([place]) 04/20/2016   UTI (urinary tract infection) 04/20/2016   Dyspnea 07/19/2015   Interstitial lung disease ([place]) 11/20/2014   SIRS (systemic inflammatory response syndrome) ([place]) 02/03/2014   IDDM (insulin dependent diabetes mellitus) 02/03/2014   Tachycardia 40/98/1191   Chronic systolic CHF (congestive heart failure) ([place]) 09/29/2012   Hypoxia 03/06/2012   Hypertensive heart disease 03/06/2012   Nonischemic cardiomyopathy ([place]) 03/06/2012   Tobacco abuse 03/06/2012   Type 2 diabetes mellitus ([place]) 03/06/2012   Hypertension 03/06/2012   CAD (coronary artery disease), native coronary artery 03/06/2012    Hypokalemia 03/06/2012   Acute bronchitis 02/01/2009   Sleep apnea 02/01/2009   Chest pain 02/01/2009    Past Medical History:  Diagnosis Date   Anemia    a. Noted on 07/2012 labs, instructed to f/u PCP.   Arthritis    "joints" (11/18/2012)   CAD (coronary artery disease), native coronary artery    a. Nonobstructive by cath 02/2012 (done because of low EF).   Chronic bronchitis ([place])    "~ every other year" (11/18/2012)   Chronic combined systolic and diastolic CHF (congestive heart failure) ([place])    a. 03/05/12 echo:  LVEF 20-25%, moderate LVH , inferior and basal to mid septal akinesis, anterior moderate to severe hypokinesis and grade 2 diastolic dysfunction. b. EF 07/2012: EF still 25% (unclear medication compliance).   Chronic lower back pain    Headache(784.0)    "often; maybe not daily" (11/18/2012)   High cholesterol    History of noncompliance with medical treatment    Hypertension    LBBB (left bundle branch block)    Orthopnea    Tobacco abuse    Type II diabetes mellitus ([place])     Family History  Problem Relation Age of Onset   Heart failure Mother    Heart disease Neg Hx    Past Surgical History:  Procedure Laterality Date   BI-VENTRICULAR IMPLANTABLE CARDIOVERTER DEFIBRILLATOR N/A 11/18/2012   Procedure: BI-VENTRICULAR IMPLANTABLE CARDIOVERTER DEFIBRILLATOR  (CRT-D);  Surgeon: Evans Lance, MD;  Location: [facility] CATH LAB;  Service: Cardiovascular;  Laterality: N/A;   BI-VENTRICULAR IMPLANTABLE CARDIOVERTER DEFIBRILLATOR  (CRT-D)  11/18/2012   BIV ICD GENERATOR CHANGEOUT N/A 11/22/2020   Procedure: BIV ICD GENERATOR CHANGEOUT;  Surgeon: Evans Lance, MD;  Location: [facility] CV LAB;  Service: Cardiovascular;  Laterality: N/A;   CARDIAC CATHETERIZATION  03/04/12   nonobstructive CAD, elevated LVEDP and tortuous vessels suggestive of long-standing hypertension   COLONOSCOPY WITH PROPOFOL N/A 12/12/2016   Procedure: COLONOSCOPY WITH PROPOFOL;  Surgeon: Carol Ada,  MD;  Location: [facility] ENDOSCOPY;  Service: Endoscopy;  Laterality: N/A;   JOINT REPLACEMENT     Bilateral hip and right knee   LEFT HEART CATH N/A 03/05/2012   Procedure: LEFT HEART CATH;  Surgeon: Larey Dresser, MD;  Location: [facility] CATH LAB;  Service: Cardiovascular;  Laterality: N/A;   Social History   Social History Narrative   Not on file   Immunization History  Administered Date(s) Administered   PFIZER(Purple Top)SARS-COV-2 Vaccination 03/28/2019, 04/20/2019   Tdap 09/04/2015     Objective: Vital Signs: There were no vitals taken for this visit.   Physical Exam   Musculoskeletal Exam: ***  CDAI Exam: CDAI Score: -- Patient Global: --; Provider Global: -- Swollen: --; Tender: -- Joint Exam 03/06/2022   No joint exam has been documented for this visit   There is currently no information documented on the homunculus. Go to the Rheumatology activity and complete the homunculus joint exam.  Investigation: No additional findings.  Imaging: OCT, Retina - OU - Both Eyes  Result Date: 02/15/2022 Right Eye Quality was good. Central Foveal Thickness: 488. Progression has been stable. Findings include no IRF, no SRF, abnormal foveal contour, epiretinal membrane, macular pucker, preretinal fibrosis (Thick ERM and macular pucker -- stable). Left Eye Quality was good. Central Foveal Thickness: 235. Progression has been stable. Findings include normal foveal contour, no IRF, no SRF, vitreomacular adhesion . Notes *Images captured and stored on drive Diagnosis / Impression: OD: Thick ERM w/ pucker -- stable OS: NFP, no IRF/SRF, mild VMA Clinical management: See below Abbreviations: NFP - Normal foveal profile. CME - cystoid macular edema. PED - pigment epithelial detachment. IRF - intraretinal fluid. SRF - subretinal fluid. EZ - ellipsoid zone. ERM - epiretinal membrane. ORA - outer retinal atrophy. ORT - outer retinal tubulation. SRHM - subretinal hyper-reflective material    Recent  Labs: Lab Results  Component Value Date   WBC 8.2 09/06/2021   HGB 12.0 09/06/2021   PLT 340 09/06/2021   NA 140 10/07/2021   K 3.9 10/07/2021   CL 108 10/07/2021   CO2 25 10/07/2021   GLUCOSE 130 (H) 10/07/2021   BUN 15 10/07/2021   CREATININE 1.08 (H) 10/07/2021   BILITOT 0.6 09/06/2021   ALKPHOS 89 09/06/2021   AST 14 (L) 09/06/2021   ALT 11 09/06/2021   PROT 6.9 09/06/2021   ALBUMIN 3.6 09/06/2021   CALCIUM 9.2 10/07/2021   GFRAA >60 08/03/2019    Speciality Comments: No specialty comments available.  Procedures:  No procedures performed Allergies: Morphine and related and Penicillins   Assessment / Plan:     Visit Diagnoses: Polyarthralgia  Pain in both hands - 06/25/21:RF<10, anti-CCP 3.  Hand changes suggestive of RA  Radiculopathy, lumbar region  Chronic pain syndrome  Lumbar radiculopathy  Paroxysmal atrial fibrillation (HCC)  Coronary artery disease involving native coronary artery of native heart without angina pectoris  Chronic systolic CHF (congestive heart failure) ([place])  Primary hypertension  Nonischemic cardiomyopathy (HCC)  Interstitial lung disease (HCC)  Obstructive sleep apnea syndrome  Cellulitis in diabetic foot ([place])  Type 2 diabetes mellitus with hyperglycemia, with long-term current use of insulin (HCC)  Biventricular automatic implantable cardioverter defibrillator in situ  SIRS (systemic inflammatory response syndrome) ([place])  Tobacco abuse  Orders: No orders of the defined types were placed in this encounter.  No orders of the defined types were placed in this encounter.   Face-to-face time spent with patient was *** minutes. Greater than 50% of time was spent in counseling and coordination of care.  Follow-Up Instructions: No follow-ups on file.   Ofilia Neas, PA-C  Note - This record has been created using Dragon software.  Chart creation errors have been sought, but may not always  have been located. Such  creation errors do not reflect on  the standard of medical care.,

## 2022-03-05 ENCOUNTER — Ambulatory Visit: Payer: 59

## 2022-03-06 ENCOUNTER — Ambulatory Visit: Payer: Medicare Other | Admitting: Rheumatology

## 2022-03-06 DIAGNOSIS — I2510 Atherosclerotic heart disease of native coronary artery without angina pectoris: Secondary | ICD-10-CM

## 2022-03-06 DIAGNOSIS — I5022 Chronic systolic (congestive) heart failure: Secondary | ICD-10-CM

## 2022-03-06 DIAGNOSIS — I480 Paroxysmal atrial fibrillation: Secondary | ICD-10-CM

## 2022-03-06 DIAGNOSIS — G894 Chronic pain syndrome: Secondary | ICD-10-CM

## 2022-03-06 DIAGNOSIS — I428 Other cardiomyopathies: Secondary | ICD-10-CM

## 2022-03-06 DIAGNOSIS — E11628 Type 2 diabetes mellitus with other skin complications: Secondary | ICD-10-CM

## 2022-03-06 DIAGNOSIS — Z720 Tobacco use: Secondary | ICD-10-CM

## 2022-03-06 DIAGNOSIS — M79641 Pain in right hand: Secondary | ICD-10-CM

## 2022-03-06 DIAGNOSIS — Z95810 Presence of automatic (implantable) cardiac defibrillator: Secondary | ICD-10-CM

## 2022-03-06 DIAGNOSIS — J849 Interstitial pulmonary disease, unspecified: Secondary | ICD-10-CM

## 2022-03-06 DIAGNOSIS — M5416 Radiculopathy, lumbar region: Secondary | ICD-10-CM

## 2022-03-06 DIAGNOSIS — M2550 Pain in unspecified joint: Secondary | ICD-10-CM

## 2022-03-06 DIAGNOSIS — Z794 Long term (current) use of insulin: Secondary | ICD-10-CM

## 2022-03-06 DIAGNOSIS — R6510 Systemic inflammatory response syndrome (SIRS) of non-infectious origin without acute organ dysfunction: Secondary | ICD-10-CM

## 2022-03-06 DIAGNOSIS — G4733 Obstructive sleep apnea (adult) (pediatric): Secondary | ICD-10-CM

## 2022-03-06 DIAGNOSIS — I10 Essential (primary) hypertension: Secondary | ICD-10-CM

## 2022-03-09 ENCOUNTER — Emergency Department (HOSPITAL_COMMUNITY)
Admission: EM | Admit: 2022-03-09 | Discharge: 2022-03-09 | Disposition: A | Discharge status: Home / Self Care | Payer: 59 | Attending: Emergency Medicine | Admitting: Emergency Medicine

## 2022-03-09 ENCOUNTER — Emergency Department (HOSPITAL_COMMUNITY): Payer: 59

## 2022-03-09 DIAGNOSIS — Z7901 Long term (current) use of anticoagulants: Secondary | ICD-10-CM | POA: Insufficient documentation

## 2022-03-09 DIAGNOSIS — R0981 Nasal congestion: Secondary | ICD-10-CM | POA: Insufficient documentation

## 2022-03-09 DIAGNOSIS — Z8616 Personal history of COVID-19: Secondary | ICD-10-CM | POA: Insufficient documentation

## 2022-03-09 DIAGNOSIS — I2510 Atherosclerotic heart disease of native coronary artery without angina pectoris: Secondary | ICD-10-CM | POA: Insufficient documentation

## 2022-03-09 DIAGNOSIS — I4891 Unspecified atrial fibrillation: Secondary | ICD-10-CM | POA: Insufficient documentation

## 2022-03-09 DIAGNOSIS — I110 Hypertensive heart disease with heart failure: Secondary | ICD-10-CM | POA: Insufficient documentation

## 2022-03-09 DIAGNOSIS — I5023 Acute on chronic systolic (congestive) heart failure: Secondary | ICD-10-CM | POA: Diagnosis not present

## 2022-03-09 DIAGNOSIS — Z79899 Other long term (current) drug therapy: Secondary | ICD-10-CM | POA: Diagnosis not present

## 2022-03-09 DIAGNOSIS — R051 Acute cough: Secondary | ICD-10-CM

## 2022-03-09 DIAGNOSIS — Z794 Long term (current) use of insulin: Secondary | ICD-10-CM | POA: Insufficient documentation

## 2022-03-09 DIAGNOSIS — R509 Fever, unspecified: Secondary | ICD-10-CM | POA: Diagnosis not present

## 2022-03-09 DIAGNOSIS — Z1152 Encounter for screening for COVID-19: Secondary | ICD-10-CM | POA: Insufficient documentation

## 2022-03-09 LAB — BASIC METABOLIC PANEL
Anion gap: 12 (ref 5–15)
BUN: 13 mg/dL (ref 8–23)
CO2: 22 mmol/L (ref 22–32)
Calcium: 9 mg/dL (ref 8.9–10.3)
Chloride: 103 mmol/L (ref 98–111)
Creatinine, Ser: 1.08 mg/dL — ABNORMAL HIGH (ref 0.44–1.00)
GFR, Estimated: 56 mL/min — ABNORMAL LOW (ref >60)
Glucose, Bld: 220 mg/dL — ABNORMAL HIGH (ref 70–99)
Potassium: 3.5 mmol/L (ref 3.5–5.1)
Sodium: 137 mmol/L (ref 135–145)

## 2022-03-09 LAB — RESP PANEL BY RT-PCR (RSV, FLU A&B, COVID)  RVPGX2
Influenza A by PCR: NEGATIVE
Influenza B by PCR: NEGATIVE
Resp Syncytial Virus by PCR: NEGATIVE
SARS Coronavirus 2 by RT PCR: NEGATIVE

## 2022-03-09 LAB — CBC
HCT: 32.5 % — ABNORMAL LOW (ref 36.0–46.0)
Hemoglobin: 11.3 g/dL — ABNORMAL LOW (ref 12.0–15.0)
MCH: 31.3 pg (ref 26.0–34.0)
MCHC: 34.8 g/dL (ref 30.0–36.0)
MCV: 90 fL (ref 80.0–100.0)
Platelets: 360 10*3/uL (ref 150–400)
RBC: 3.61 MIL/uL — ABNORMAL LOW (ref 3.87–5.11)
RDW: 12.5 % (ref 11.5–15.5)
WBC: 10.3 10*3/uL (ref 4.0–10.5)
nRBC: 0 % (ref 0.0–0.2)

## 2022-03-09 LAB — BRAIN NATRIURETIC PEPTIDE: B Natriuretic Peptide: 273.4 pg/mL — ABNORMAL HIGH (ref 0.0–100.0)

## 2022-03-09 LAB — TROPONIN I (HIGH SENSITIVITY): Troponin I (High Sensitivity): 14 ng/L (ref <18)

## 2022-03-09 NOTE — Discharge Instructions (Signed)
I would increase the dose of Lasix that you take at home for the next 3 to 4 days to help with any of your fluid off of your lungs which may help to improve your cough, if you are still feeling poorly, with no improvement despite the above I would recommend following up with your cardiologist, PCP or return to the emergency department especially if you have new chest pain, or worsening shortness of breath.

## 2022-03-09 NOTE — ED Provider Notes (Signed)
[facility] Provider Note   CSN: [id number] Arrival date & time: 03/09/22  1353     History  Chief Complaint  Patient presents with   Cough    ENESSA CHARITY is a 72 y.o. 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  Patient reports that she did have some brief episodes of chest pain this morning, but denies significant exertional component.  She denies any nausea, vomiting.  She reports production of whitish, greenish sputum.  Patient denies any chest pain at time of my evaluation.  Cough      Home Medications Prior to Admission medications   Medication Sig Start Date End Date Taking? Authorizing Provider  apixaban (ELIQUIS) 5 MG TABS tablet TAKE 1 TABLET(5 MG) BY MOUTH TWICE DAILY 08/31/20   Larey Dresser, MD  atorvastatin (LIPITOR) 40 MG tablet Take 1 tablet by mouth once daily 02/14/21   Larey Dresser, MD  benzonatate (TESSALON) 100 MG capsule Take 1 capsule (100 mg total) by mouth every 8 (eight) hours. 12/29/21   Evlyn Courier, PA-C  carvedilol (COREG) 25 MG tablet TAKE ONE TABLET BY MOUTH TWICE A DAY WITH MEALS 01/03/21   Larey Dresser, MD  celecoxib (CELEBREX) 200 MG capsule Take by mouth. 09/02/21   [provider]  ENTRESTO 97-103 MG TAKE ONE TABLET BY MOUTH TWICE A DAY 01/03/21   Larey Dresser, MD  gabapentin (NEURONTIN) 100 MG capsule Take 100 mg by mouth 3 (three) times daily. 03/10/18   [provider]  glucose blood (FREESTYLE TEST STRIPS) test strip Use as instructed 04/25/16   Ghimire, Henreitta Leber, MD  glucose monitoring kit (FREESTYLE) monitoring kit 1 each by Does not apply route 4 (four) times daily - after meals and at bedtime. 1 month Diabetic Testing Supplies for QAC-QHS accuchecks. 04/25/16   Ghimire, Henreitta Leber, MD  HUMALOG KWIKPEN  100 UNIT/ML KwikPen Sliding scale 02/12/18   [provider]  insulin glargine (LANTUS) 100 UNIT/ML Solostar Pen Inject 50 Units into the skin at bedtime.    [provider]  Insulin Pen Needle 32G X 8 MM MISC Use as directed 06/18/16   Arbutus Leas, NP  isosorbide-hydrALAZINE (BIDIL) 20-37.5 MG tablet TAKE 2 TABLETS BY MOUTH 3 TIMES  DAILY ; NEEDS APPOINTMENT FOR  MORE REFILLS 05/20/21   Larey Dresser, MD  Lancets (FREESTYLE) lancets Use as instructed 06/18/16   Arbutus Leas, NP  metFORMIN (GLUMETZA) 1000 MG (MOD) 24 hr tablet Take 1,000 mg by mouth in the morning and at bedtime.    [provider]  oseltamivir (TAMIFLU) 75 MG capsule Take 1 capsule (75 mg total) by mouth every 12 (twelve) hours. 12/29/21   Deatra Canter, Amjad, PA-C  oxyCODONE (ROXICODONE) 5 MG immediate release tablet Take 1 tablet (5 mg total) by mouth every 4 (four) hours as needed for severe pain. 06/14/21   Smoot, Sarah A, PA-C  potassium chloride (KLOR-CON M) 10 MEQ tablet Take 10 mEq by mouth 2 (two) times daily.    [provider]  spironolactone (ALDACTONE) 25 MG tablet TAKE 1 TABLET(25 MG) BY MOUTH DAILY 08/15/20   Milford, Benton, FNP  Tafamidis (VYNDAMAX) 61 MG CAPS TAKE 1 CAPSULE (61 MG) BY MOUTH DAILY. 10/14/21 10/14/22  Larey Dresser, MD  torsemide (DEMADEX) 100 MG tablet Take 50 mg  by mouth daily. 09/17/21   [provider]      Allergies    Morphine and related and Penicillins    Review of Systems   Review of Systems  Respiratory:  Positive for cough.   All other systems reviewed and are negative.   Physical Exam Updated Vital Signs BP (!) 140/84 (BP Location: Right Arm)   Pulse 94   Temp 98.9 F (37.2 C) (Oral)   Resp 16   SpO2 100%  Physical Exam Vitals and nursing note reviewed.  Constitutional:      General: She is not in acute distress.    Appearance: Normal appearance.  HENT:     Head: Normocephalic and atraumatic.  Eyes:     General:        Right  eye: No discharge.        Left eye: No discharge.  Cardiovascular:     Rate and Rhythm: Normal rate and regular rhythm.     Heart sounds: No murmur heard.    No friction rub. No gallop.  Pulmonary:     Effort: Pulmonary effort is normal.     Breath sounds: Normal breath sounds.     Comments: No wheezing, stridor, rhonchi, rales.  No respiratory distress, or focal consolidation noted Abdominal:     General: Bowel sounds are normal.     Palpations: Abdomen is soft.  Skin:    General: Skin is warm and dry.     Capillary Refill: Capillary refill takes less than 2 seconds.  Neurological:     Mental Status: She is alert and oriented to person, place, and time.  Psychiatric:        Mood and Affect: Mood normal.        Behavior: Behavior normal.     ED Results / Procedures / Treatments   Labs (all labs ordered are listed, but only abnormal results are displayed) Labs Reviewed  CBC - Abnormal; Notable for the following components:      Result Value   RBC 3.61 (*)    Hemoglobin 11.3 (*)    HCT 32.5 (*)    All other components within normal limits  BASIC METABOLIC PANEL - Abnormal; Notable for the following components:   Glucose, Bld 220 (*)    Creatinine, Ser 1.08 (*)    GFR, Estimated 56 (*)    All other components within normal limits  BRAIN NATRIURETIC PEPTIDE - Abnormal; Notable for the following components:   B Natriuretic Peptide 273.4 (*)    All other components within normal limits  RESP PANEL BY RT-PCR (RSV, FLU A&B, COVID)  RVPGX2  TROPONIN I (HIGH SENSITIVITY)    EKG EKG Interpretation  Date/Time:  Sunday March 09 2022 16:34:38 EST Ventricular Rate:  80 PR Interval:  183 QRS Duration: 143 QT Interval:  439 QTC Calculation: 507 R Axis:   89 Text Interpretation: Sinus arrhythmia Nonspecific intraventricular conduction delay Borderline T abnormalities, lateral leads No significant change since last tracing Confirmed by Dorie Rank [phone]) on 03/09/2022 5:22:36  PM  Radiology DG Chest 2 View  Result Date: 03/09/2022 CLINICAL DATA:  Pt reports mid chest pain and headache x today. EXAM: CHEST - 2 VIEW COMPARISON:  09/06/2021 FINDINGS: No focal consolidation. No pleural effusion or pneumothorax. Heart and mediastinal contours are unremarkable. Three lead cardiac pacemaker. No acute osseous abnormality. IMPRESSION: No active cardiopulmonary disease. Electronically Signed   By: Kathreen Devoid M.D.   On: 03/09/2022 15:16    Procedures Procedures  Medications Ordered in ED Medications - No data to display  ED Course/ Medical Decision Making/ A&P                             Medical Decision Making Amount and/or Complexity of Data Reviewed Radiology: ordered.   This patient is a 72 y.o. female  who presents to the ED for concern of cough, congestion, concern for developing pneumonia, chest pain.   Differential diagnoses prior to evaluation: The emergent differential diagnosis includes, but is not limited to,  ACS, AAS, PE, Mallory-Weiss, Boerhaave's, Pneumonia, acute bronchitis, asthma or COPD exacerbation, anxiety, MSK pain or traumatic injury to the chest, acid reflux, acute upper respiratory infection, acute bronchitis, chronic bronchitis, interstitial lung disease, ARDS, PE, carbon monoxide poisoning, spontaneous pneumothorax, new CHF vs CHF exacerbation, versus other . This is not an exhaustive differential.   Past Medical History / Co-morbidities: Hypertension, CAD, previous tobacco abuse although she denies any current use, CHF, anemia, left bundle branch block, diabetes  Additional history: Chart reviewed. Pertinent results include: Reviewed echo from 2023, Patient with a EF of 45 to 50%, with inferolateral wall hypokinesis  Physical Exam: Physical exam performed. The pertinent findings include: Overall with no ttp of chest wall, no accessory breath sounds, focal consolidation  Lab Tests/Imaging studies: I personally interpreted  labs/imaging and the pertinent results include: Patient with moderate hyperglycemia, glucose 220, mild kidneys function, creatinine 1.08, not significantly changed from baseline, her initial troponin is unremarkable at 14.  Her CBC with no leukocytosis, mild anemia, hemoglobin 11.3.  RVP negative for COVID, flu, RSV.  Her BNP shows 273.4, may need 3 to 5-day increase in her Lasix to help with any excess fluid, and cardiology follow-up, but no evidence of severe acute CHF exacerbation sinus arrhythmia, no significant change from baseline.  I independently interpreted plain film chest x-ray which shows no evidence of acute intrathoracic abnormality, no evidence of pneumonia at this time.  I agree with the radiologist interpretation.  Cardiac monitoring: EKG obtained and interpreted by my attending physician which shows: Sinus arrhythmia, no acute change from baseline   Disposition: After consideration of the diagnostic results and the patients response to treatment, I feel that patient with cough, may have a viral cough, may have mild exacerbation of her CHF.   emergency department workup does not suggest an emergent condition requiring admission or immediate intervention beyond what has been performed at this time. The plan is: as above. The patient is safe for discharge and has been instructed to return immediately for worsening symptoms, change in symptoms or any other concerns.  Final Clinical Impression(s) / ED Diagnoses Final diagnoses:  Acute on chronic systolic congestive heart failure ([place])  Acute cough    Rx / DC Orders ED Discharge Orders     None         Dorien Chihuahua 03/09/22 1748    Dorie Rank, MD 03/10/22 [phone]

## 2022-03-09 NOTE — ED Triage Notes (Signed)
Pt here from home with c/o cough and congestion , no fevers ,

## 2022-03-09 NOTE — ED Notes (Signed)
Pt was given two warm blankets and a cup of ice water

## 2022-03-12 ENCOUNTER — Telehealth (HOSPITAL_COMMUNITY): Payer: Self-pay

## 2022-03-12 NOTE — Telephone Encounter (Signed)
Spoke to patient and gave her instructions on medication. We scheduled an APP appointment for 3/6. She cannot come in sooner for labs. I instructed her to call and let us know how she is feeling, especially if symptoms do not subside. She agreed and verbalized understanding by teach back.

## 2022-03-12 NOTE — Telephone Encounter (Signed)
Patient called after being in the ED with shortness of breath, coughing, and coughing up mucus. They indicated no Covid or pneumonia.  She stated she has been out of her Torsemide for over a week until she saw PCP yesterday and they sent in for her. She is taking 86m of Torsemide today. She denies swelling or weight gain, or any other symptoms. She states the ED told her to check in with [place] I told her she would need to come in, but she wanted to see if there was anything we can do first since the schedules are a further out. Please advise.

## 2022-03-13 NOTE — Progress Notes (Deleted)
Office Visit Note  Patient: [name]             Date of Birth: Aug 28, 1950           MRN: [id number]             PCP: [facility] Referring: [place] Visit Date: 03/27/2022 Occupation: @GUAROCC$ @  Subjective:  No chief complaint on file.   History of Present Illness: [name] is a 72 y.o. female ***     Activities of Daily Living:  Patient reports morning stiffness for *** {minute/hour:19697}.   Patient {ACTIONS;DENIES/REPORTS:21021675::"Denies"} nocturnal pain.  Difficulty dressing/grooming: {ACTIONS;DENIES/REPORTS:21021675::"Denies"} Difficulty climbing stairs: {ACTIONS;DENIES/REPORTS:21021675::"Denies"} Difficulty getting out of chair: {ACTIONS;DENIES/REPORTS:21021675::"Denies"} Difficulty using hands for taps, buttons, cutlery, and/or writing: {ACTIONS;DENIES/REPORTS:21021675::"Denies"}  No Rheumatology ROS completed.   PMFS History:  Patient Active Problem List   Diagnosis Date Noted   Right foot infection 10/22/2017   Cellulitis in diabetic foot ([place]) 10/22/2017   Hyperglycemia 10/22/2017   Lumbar radiculopathy 09/18/2017   Degenerative spondylolisthesis 09/18/2017   Atrial fibrillation ([place]) 02/11/2017   Paroxysmal A-fib ([facility])    Biventricular automatic implantable cardioverter defibrillator in situ    Sepsis ([place]) 04/20/2016   UTI (urinary tract infection) 04/20/2016   Dyspnea 07/19/2015   Interstitial lung disease ([place]) 11/20/2014   SIRS (systemic inflammatory response syndrome) ([place]) 02/03/2014   IDDM (insulin dependent diabetes mellitus) 02/03/2014   Tachycardia [id number]   Chronic systolic CHF (congestive heart failure) ([place]) 09/29/2012   Hypoxia 03/06/2012   Hypertensive heart disease 03/06/2012   Nonischemic cardiomyopathy ([place]) 03/06/2012   Tobacco abuse 03/06/2012   Type 2 diabetes mellitus ([place]) 03/06/2012   Hypertension 03/06/2012   CAD (coronary artery disease), native coronary artery 03/06/2012    Hypokalemia 03/06/2012   Acute bronchitis 02/01/2009   Sleep apnea 02/01/2009   Chest pain 02/01/2009    Past Medical History:  Diagnosis Date   Anemia    a. Noted on 07/2012 labs, instructed to f/u PCP.   Arthritis    "joints" (11/18/2012)   CAD (coronary artery disease), native coronary artery    a. Nonobstructive by cath 02/2012 (done because of low EF).   Chronic bronchitis ([place])    "~ every other year" (11/18/2012)   Chronic combined systolic and diastolic CHF (congestive heart failure) ([place])    a. 03/05/12 echo:  LVEF 20-25%, moderate LVH , inferior and basal to mid septal akinesis, anterior moderate to severe hypokinesis and grade 2 diastolic dysfunction. b. EF 07/2012: EF still 25% (unclear medication compliance).   Chronic lower back pain    Headache(784.0)    "often; maybe not daily" (11/18/2012)   High cholesterol    History of noncompliance with medical treatment    Hypertension    LBBB (left bundle branch block)    Orthopnea    Tobacco abuse    Type II diabetes mellitus ([place])     Family History  Problem Relation Age of Onset   Heart failure Mother    Heart disease Neg Hx    Past Surgical History:  Procedure Laterality Date   BI-VENTRICULAR IMPLANTABLE CARDIOVERTER DEFIBRILLATOR N/A 11/18/2012   Procedure: BI-VENTRICULAR IMPLANTABLE CARDIOVERTER DEFIBRILLATOR  (CRT-D);  Surgeon: Evans Lance, MD;  Location: [facility] CATH LAB;  Service: Cardiovascular;  Laterality: N/A;   BI-VENTRICULAR IMPLANTABLE CARDIOVERTER DEFIBRILLATOR  (CRT-D)  11/18/2012   BIV ICD GENERATOR CHANGEOUT N/A 11/22/2020   Procedure: BIV ICD GENERATOR CHANGEOUT;  Surgeon: Evans Lance, MD;  Location: [facility] CV LAB;  Service: Cardiovascular;  Laterality: N/A;   CARDIAC CATHETERIZATION  03/04/12   nonobstructive CAD, elevated LVEDP and tortuous vessels suggestive of long-standing hypertension   COLONOSCOPY WITH PROPOFOL N/A 12/12/2016   Procedure: COLONOSCOPY WITH PROPOFOL;  Surgeon: Carol Ada, MD;  Location: [facility] ENDOSCOPY;  Service: Endoscopy;  Laterality: N/A;   JOINT REPLACEMENT     Bilateral hip and right knee   LEFT HEART CATH N/A 03/05/2012   Procedure: LEFT HEART CATH;  Surgeon: Larey Dresser, MD;  Location: [facility] CATH LAB;  Service: Cardiovascular;  Laterality: N/A;   Social History   Social History Narrative   Not on file   Immunization History  Administered Date(s) Administered   PFIZER(Purple Top)SARS-COV-2 Vaccination 03/28/2019, 04/20/2019   Tdap 09/04/2015     Objective: Vital Signs: There were no vitals taken for this visit.   Physical Exam   Musculoskeletal Exam: ***  CDAI Exam: CDAI Score: -- Patient Global: --; Provider Global: -- Swollen: --; Tender: -- Joint Exam 03/27/2022   No joint exam has been documented for this visit   There is currently no information documented on the homunculus. Go to the Rheumatology activity and complete the homunculus joint exam.  Investigation: No additional findings.  Imaging: DG Chest 2 View  Result Date: 03/09/2022 CLINICAL DATA:  Pt reports mid chest pain and headache x today. EXAM: CHEST - 2 VIEW COMPARISON:  09/06/2021 FINDINGS: No focal consolidation. No pleural effusion or pneumothorax. Heart and mediastinal contours are unremarkable. Three lead cardiac pacemaker. No acute osseous abnormality. IMPRESSION: No active cardiopulmonary disease. Electronically Signed   By: Kathreen Devoid M.D.   On: 03/09/2022 15:16   OCT, Retina - OU - Both Eyes  Result Date: 02/15/2022 Right Eye Quality was good. Central Foveal Thickness: 488. Progression has been stable. Findings include no IRF, no SRF, abnormal foveal contour, epiretinal membrane, macular pucker, preretinal fibrosis (Thick ERM and macular pucker -- stable). Left Eye Quality was good. Central Foveal Thickness: 235. Progression has been stable. Findings include normal foveal contour, no IRF, no SRF, vitreomacular adhesion . Notes *Images captured and stored  on drive Diagnosis / Impression: OD: Thick ERM w/ pucker -- stable OS: NFP, no IRF/SRF, mild VMA Clinical management: See below Abbreviations: NFP - Normal foveal profile. CME - cystoid macular edema. PED - pigment epithelial detachment. IRF - intraretinal fluid. SRF - subretinal fluid. EZ - ellipsoid zone. ERM - epiretinal membrane. ORA - outer retinal atrophy. ORT - outer retinal tubulation. SRHM - subretinal hyper-reflective material    Recent Labs: Lab Results  Component Value Date   WBC 10.3 03/09/2022   HGB 11.3 (L) 03/09/2022   PLT 360 03/09/2022   NA 137 03/09/2022   K 3.5 03/09/2022   CL 103 03/09/2022   CO2 22 03/09/2022   GLUCOSE 220 (H) 03/09/2022   BUN 13 03/09/2022   CREATININE 1.08 (H) 03/09/2022   BILITOT 0.6 09/06/2021   ALKPHOS 89 09/06/2021   AST 14 (L) 09/06/2021   ALT 11 09/06/2021   PROT 6.9 09/06/2021   ALBUMIN 3.6 09/06/2021   CALCIUM 9.0 03/09/2022   GFRAA >60 08/03/2019    Speciality Comments: No specialty comments available.  Procedures:  No procedures performed Allergies: Morphine and related and Penicillins   Assessment / Plan:     Visit Diagnoses: No diagnosis found.  Orders: No orders of the defined types were placed in this encounter.  No orders of the defined types were placed in this encounter.   Face-to-face time spent  with patient was *** minutes. Greater than 50% of time was spent in counseling and coordination of care.  Follow-Up Instructions: No follow-ups on file.   Bo Merino, MD  Note - This record has been created using Editor, commissioning.  Chart creation errors have been sought, but may not always  have been located. Such creation errors do not reflect on  the standard of medical care.

## 2022-03-27 ENCOUNTER — Ambulatory Visit: Payer: Medicare Other | Admitting: Rheumatology

## 2022-04-01 ENCOUNTER — Telehealth (HOSPITAL_COMMUNITY): Payer: Self-pay

## 2022-04-01 NOTE — Progress Notes (Signed)
Patient ID: [name], female   DOB: 12-11-1950, 72 y.o.   MRN: [id number]   Advanced Heart Failure Clinic Note   PCP: Dr. Alyson Ingles Cardiology: Dr Dora Sims is a 72 y.o. female with history of nonischemic cardiomyopathy.  She was admitted with CHF exacerbation in 02/2012.  EF 20-25% on echo, LHC with nonobstructive CAD.  She was started on cardiac meds and discharged.  In 07/2012, she was admitted again with CHF exacerbation.  She had run out of Lasix.  She was taking her other heart medications as ordered, however.  She was diuresed and discharged. She had a chronic LBBB, and Medtronic CRT-D device was placed in 10/14.  She was admitted in 6/16 with hypertensive emergency and CHF exacerbation.   Also of note, she had PFTs in 9/16 showing restrictive spirometry with low lung volume and DLCO.  She was supposed to get a high resolution CT to evaluate for interstitial lung disease but never had the study.     Admitted March 2018 with urosepsis--> E Coli Bacteremia. Completed antibiotic course. EF was down from previous to 30-35%. Also had atrial fibrillation so she was loaded on amiodarone. Placed on eliquis. Discharge weight was 208 pounds.  She is now off amiodarone.   Admitted to [facility] 1/16 -> 02/13/16 with CP and dizziness. Found to be in Afib. Converted spontaneously overnight with BB and diuresis.   Echo 11/19 showed EF 35-40%.  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    Patient had COVID-19 infection in 10/20.   Echo in 11/20 showed EF 40-45% with mild LVH and mildly decreased RV systolic function.  Echo in 2/22 showed EF up to 55-60%, moderate LVH, normal RV. Echo 5/23 showed EF 45-50%, with severe LVH  Seen in the ED 09/06/21 with CP. AHF consulted. Hs-trop negative, BNP 415. Given IV lasix and discharged home.  Today she returns for HF follow up. Overall feeling fine. She is not SOB walking on flat ground, limited by  fatigue if she does too much. Physically limited by knee arthritis. Denies palpitations, abnormal bleeding, CP, dizziness, edema, or PND/Orthopnea. Appetite ok. No fever or chills. Weight at home 228 pounds. Taking all medications. Has been drinking more fluid recently.  Medtronic device interrogation: OptiVol creeping up, thoracic impedence down, , 99% BiV pacing, no VT/VF, 4-6 hrs/day activity.      ECG (personally reviewed): none ordered today.  Labs (2/14): SPEP negative, UPEP negative, HIV negative Labs (04/14/2017): K 3.8 Creatinine 0.94  Labs (9/19): K 3.3, creatinine 0.86 Labs (1/20): K 4, creatinine 0.87 Labs (2/20): K 4.2, creatinine 1.16 Labs (8/20): K 3.8, creatinine 0.78 Labs (11/20): LDL 104 Labs (12/20): K 3.6, creatinine 0.93 Labs (7/21): K 3.5, creatinine 1.0 Labs (12/21): K 3.6, creatinine 1.05, hgbA1c 8 Labs (4/22): K 3.1, creatinine 1.01 Labs (7/22): K 4.2, creatinine 0.99 Labs (2/23): K 4.3, creatinine 1.01, LDL 94, HDL 47 Labs (8/23): K 3.8, creatinine 0.90   PMH: 1. HTN 2. Type II diabetes with neuropathy 3. Nonischemic cardiomyopathy: ? Due to HTN versus LBBB CMP.  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  Echo (2/14) with EF 20-25%.  Echo (7/14) with EF 25%, diffuse hypokinesis.  HIV, SPEP, UPEP negative.  Has LBBB. CRT-D 10/2012 (Medtronic).  Echo (4/15) with EF 45-50%, mild diffuse hypokinesis, PA systolic pressure 38 mmHg.  Echo (6/16) with EF 40-45%, mild LVH, septal and inferior hypokinesis.   - Echo (3/18): EF 30-35%.  Grade 1 DD - Echo (6/18): EF 45-50%, moderate LVH, normal RV size with mildly decreased systolic function.  - Echo (11/19): EF 35-40%, moderate LVH, moderate diastolic dysfunction, normal RV size with mildly decreased systolic function, PASP 43 mmHg.  - Echo (11/20): EF 40-45%, mild LVH, mildly decreased RV systolic function.  - Echo (2/22): EF 55-60%, moderate LVH, normal RV.  - Echo (5/23): EF 45-50%, severe LVH, normal RV 4. Chronic LBBB 5.  Right TKR 6. Bilateral THR.  7. Hyperlipidemia 8. ?COPD: Has oxygen for use with exertion.  - PFTs (9/16) with FEV1 77%, FVC 76%, ratio 101%, TLC 63%, DLCO 41% => moderate restrictive deficit  9. Atrial fibrillation: Paroxysmal.  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  11. COVID-19 infection in 10/20.  12. OSA: CPAP. 13. OA: Knees  SH: Prior smoker, quit 2/14.  Never drank ETOH.  No drugs. Lives with son.   FH: Mother with "heart trouble."   Review of systems complete and found to be negative unless listed in HPI.    Current Outpatient Medications  Medication Sig Dispense Refill   apixaban (ELIQUIS) 5 MG TABS tablet TAKE 1 TABLET(5 MG) BY MOUTH TWICE DAILY 180 tablet 2   atorvastatin (LIPITOR) 40 MG tablet Take 1 tablet by mouth once daily 90 tablet 3   benzonatate (TESSALON) 100 MG capsule Take 1 capsule (100 mg total) by mouth every 8 (eight) hours. 21 capsule 0   carvedilol (COREG) 25 MG tablet TAKE ONE TABLET BY MOUTH TWICE A DAY WITH MEALS 180 tablet 2   celecoxib (CELEBREX) 200 MG capsule Take by mouth.     ENTRESTO 97-103 MG TAKE ONE TABLET BY MOUTH TWICE A DAY 180 tablet 2   gabapentin (NEURONTIN) 100 MG capsule Take 100 mg by mouth 3 (three) times daily.     glucose blood (FREESTYLE TEST STRIPS) test strip Use as instructed 100 each 0   glucose monitoring kit (FREESTYLE) monitoring kit 1 each by Does not apply route 4 (four) times daily - after meals and at bedtime. 1 month Diabetic Testing Supplies for QAC-QHS accuchecks. 1 each 1   HUMALOG KWIKPEN 100 UNIT/ML KwikPen Sliding scale     insulin glargine (LANTUS) 100 UNIT/ML Solostar Pen Inject 50 Units into the skin at bedtime.     Insulin Pen Needle 32G X 8 MM MISC Use as directed 100 each 0   isosorbide-hydrALAZINE (BIDIL) 20-37.5 MG tablet TAKE 2 TABLETS BY MOUTH 3 TIMES  DAILY ; NEEDS APPOINTMENT FOR  MORE REFILLS 540 tablet 3    Lancets (FREESTYLE) lancets Use as instructed 100 each 0   metFORMIN (GLUMETZA) 1000 MG (MOD) 24 hr tablet Take 1,000 mg by mouth in the morning and at bedtime.     oseltamivir (TAMIFLU) 75 MG capsule Take 1 capsule (75 mg total) by mouth every 12 (twelve) hours. 10 capsule 0   oxyCODONE (ROXICODONE) 5 MG immediate release tablet Take 1 tablet (5 mg total) by mouth every 4 (four) hours as needed for severe pain. 30 tablet 0   potassium chloride (KLOR-CON M) 10 MEQ tablet Take 10 mEq by mouth 2 (two) times daily.     spironolactone (ALDACTONE) 25 MG tablet TAKE 1 TABLET(25 MG) BY MOUTH DAILY 90 tablet 3   Tafamidis (VYNDAMAX) 61 MG CAPS TAKE 1 CAPSULE (61 MG) BY MOUTH DAILY. 90 capsule 3   torsemide (DEMADEX) 100 MG tablet Take 50 mg by  mouth daily.     No current facility-administered medications for this visit.   There were no vitals taken for this visit.  Wt Readings from Last 3 Encounters:  10/07/21 103.4 kg (228 lb)  09/06/21 104.3 kg (230 lb)  06/14/21 108.9 kg (240 lb)    Physical exam General:  NAD. No resp difficulty HEENT: Normal Neck: Supple. JVP 7-8. Carotids 2+ bilat; no bruits. No lymphadenopathy or thryomegaly appreciated. Cor: PMI nondisplaced. Regular rate & rhythm. No rubs, gallops or murmurs. Lungs: Clear Abdomen: Soft, nontender, nondistended. No hepatosplenomegaly. No bruits or masses. Good bowel sounds. Extremities: No cyanosis, clubbing, rash, trace BLE edema, R>L Neuro: Alert & oriented x 3, cranial nerves grossly intact. Moves all 4 extremities w/o difficulty. Affect pleasant.  Assessment/Plan: 1. Chronic systolic CHF: Nonischemic cardiomyopathy, thought to be related to HTN. S/P CRT-D (Medtronic). Last echo (2/22) with EF improved up to 55-60%. Echo 5/23 showed EF 45-50%, severe LVH. She is mildly volume overloaded by Optivol. She appears to be BiV pacing appropriately.  - Increase torsemide to 100 mg daily x 3 days + increase KCL to 20 bid x 3 days, then  back to torsemide 50 mg daily + 10 KCL bid. BMET/BNP today. - Continue Coreg 25 mg bid.  - Continue Entresto 97/103 mg bid. - Continue Bidil 2 tabs tid. - Continue spiro 25 mg daily.  2. Hyperlipidemia: Continue statin.  3. HTN: Elevated today but she has not had her morning medications yet. BP generally well-controlled.  4. PAF: No prolonged episodes.    - Continue Eliquis for anticoagulation. Recent CBC stable. 5. 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 Genetic testing was negative (wild type).  - Continue tafamidis.  6. Knee osteoarthritis: She has significant limitation from knee pain.  - Ortho planning left knee arthroplasty this month.  Followup in 4 months with Dr. Aundra Dubin.    [place],   04/01/2022

## 2022-04-01 NOTE — Telephone Encounter (Signed)
Called and patient's number was not working to confirm/remind patient of their appointment at the [facility] on 04/02/22.

## 2022-04-02 ENCOUNTER — Other Ambulatory Visit (HOSPITAL_COMMUNITY): Payer: Self-pay

## 2022-04-02 ENCOUNTER — Ambulatory Visit: Payer: 59

## 2022-04-02 ENCOUNTER — Ambulatory Visit (HOSPITAL_COMMUNITY)
Admission: RE | Admit: 2022-04-02 | Discharge: 2022-04-02 | Disposition: A | Discharge status: Home / Self Care | Payer: 59 | Source: Ambulatory Visit | Attending: Cardiology | Admitting: Cardiology

## 2022-04-02 ENCOUNTER — Encounter (HOSPITAL_COMMUNITY): Payer: Self-pay

## 2022-04-02 VITALS — BP 152/78 | HR 87 | Wt 227.8 lb

## 2022-04-02 DIAGNOSIS — M170 Bilateral primary osteoarthritis of knee: Secondary | ICD-10-CM | POA: Diagnosis not present

## 2022-04-02 DIAGNOSIS — I2510 Atherosclerotic heart disease of native coronary artery without angina pectoris: Secondary | ICD-10-CM | POA: Insufficient documentation

## 2022-04-02 DIAGNOSIS — Z9981 Dependence on supplemental oxygen: Secondary | ICD-10-CM | POA: Diagnosis not present

## 2022-04-02 DIAGNOSIS — Z87891 Personal history of nicotine dependence: Secondary | ICD-10-CM | POA: Diagnosis not present

## 2022-04-02 DIAGNOSIS — E1140 Type 2 diabetes mellitus with diabetic neuropathy, unspecified: Secondary | ICD-10-CM | POA: Diagnosis not present

## 2022-04-02 DIAGNOSIS — G4733 Obstructive sleep apnea (adult) (pediatric): Secondary | ICD-10-CM | POA: Diagnosis not present

## 2022-04-02 DIAGNOSIS — I480 Paroxysmal atrial fibrillation: Secondary | ICD-10-CM | POA: Diagnosis not present

## 2022-04-02 DIAGNOSIS — I428 Other cardiomyopathies: Secondary | ICD-10-CM

## 2022-04-02 DIAGNOSIS — I110 Hypertensive heart disease with heart failure: Secondary | ICD-10-CM | POA: Insufficient documentation

## 2022-04-02 DIAGNOSIS — I5022 Chronic systolic (congestive) heart failure: Secondary | ICD-10-CM | POA: Insufficient documentation

## 2022-04-02 DIAGNOSIS — E785 Hyperlipidemia, unspecified: Secondary | ICD-10-CM | POA: Diagnosis not present

## 2022-04-02 DIAGNOSIS — Z7901 Long term (current) use of anticoagulants: Secondary | ICD-10-CM | POA: Diagnosis not present

## 2022-04-02 DIAGNOSIS — Z79899 Other long term (current) drug therapy: Secondary | ICD-10-CM | POA: Diagnosis not present

## 2022-04-02 DIAGNOSIS — Z794 Long term (current) use of insulin: Secondary | ICD-10-CM | POA: Diagnosis not present

## 2022-04-02 DIAGNOSIS — E854 Organ-limited amyloidosis: Secondary | ICD-10-CM | POA: Insufficient documentation

## 2022-04-02 DIAGNOSIS — I447 Left bundle-branch block, unspecified: Secondary | ICD-10-CM | POA: Insufficient documentation

## 2022-04-02 DIAGNOSIS — Z8616 Personal history of COVID-19: Secondary | ICD-10-CM | POA: Diagnosis not present

## 2022-04-02 DIAGNOSIS — J449 Chronic obstructive pulmonary disease, unspecified: Secondary | ICD-10-CM | POA: Diagnosis not present

## 2022-04-02 DIAGNOSIS — I43 Cardiomyopathy in diseases classified elsewhere: Secondary | ICD-10-CM | POA: Diagnosis not present

## 2022-04-02 DIAGNOSIS — Z7984 Long term (current) use of oral hypoglycemic drugs: Secondary | ICD-10-CM | POA: Diagnosis not present

## 2022-04-02 LAB — CUP PACEART REMOTE DEVICE CHECK
Battery Remaining Longevity: 88 mo
Battery Voltage: 2.98 V
Brady Statistic AP VP Percent: 0.05 %
Brady Statistic AP VS Percent: 0.01 %
Brady Statistic AS VP Percent: 98.18 %
Brady Statistic AS VS Percent: 1.76 %
Brady Statistic RA Percent Paced: 0.06 %
Brady Statistic RV Percent Paced: 25.8 %
Date Time Interrogation Session: 20240306144949
HighPow Impedance: 68 Ohm
Implantable Lead Connection Status: 753985
Implantable Lead Connection Status: 753985
Implantable Lead Connection Status: 753985
Implantable Lead Implant Date: 20141023
Implantable Lead Implant Date: 20141023
Implantable Lead Implant Date: 20141023
Implantable Lead Location: 753858
Implantable Lead Location: 753859
Implantable Lead Location: 753860
Implantable Lead Model: 4298
Implantable Lead Model: 5076
Implantable Lead Model: 6935
Implantable Pulse Generator Implant Date: 20221027
Lead Channel Impedance Value: 160.941
Lead Channel Impedance Value: 160.941
Lead Channel Impedance Value: 165.029
Lead Channel Impedance Value: 171 Ohm
Lead Channel Impedance Value: 175.622
Lead Channel Impedance Value: 304 Ohm
Lead Channel Impedance Value: 342 Ohm
Lead Channel Impedance Value: 342 Ohm
Lead Channel Impedance Value: 361 Ohm
Lead Channel Impedance Value: 399 Ohm
Lead Channel Impedance Value: 418 Ohm
Lead Channel Impedance Value: 456 Ohm
Lead Channel Impedance Value: 475 Ohm
Lead Channel Impedance Value: 532 Ohm
Lead Channel Impedance Value: 589 Ohm
Lead Channel Impedance Value: 589 Ohm
Lead Channel Impedance Value: 608 Ohm
Lead Channel Impedance Value: 608 Ohm
Lead Channel Pacing Threshold Amplitude: 0.5 V
Lead Channel Pacing Threshold Amplitude: 0.5 V
Lead Channel Pacing Threshold Amplitude: 1.625 V
Lead Channel Pacing Threshold Pulse Width: 0.4 ms
Lead Channel Pacing Threshold Pulse Width: 0.4 ms
Lead Channel Pacing Threshold Pulse Width: 0.4 ms
Lead Channel Sensing Intrinsic Amplitude: 10.875 mV
Lead Channel Sensing Intrinsic Amplitude: 10.875 mV
Lead Channel Sensing Intrinsic Amplitude: 2.625 mV
Lead Channel Sensing Intrinsic Amplitude: 2.625 mV
Lead Channel Setting Pacing Amplitude: 1.5 V
Lead Channel Setting Pacing Amplitude: 2.5 V
Lead Channel Setting Pacing Amplitude: 2.5 V
Lead Channel Setting Pacing Pulse Width: 0.4 ms
Lead Channel Setting Pacing Pulse Width: 0.4 ms
Lead Channel Setting Sensing Sensitivity: 0.45 mV
Zone Setting Status: 755011
Zone Setting Status: 755011

## 2022-04-02 LAB — BASIC METABOLIC PANEL
Anion gap: 10 (ref 5–15)
BUN: 19 mg/dL (ref 8–23)
CO2: 28 mmol/L (ref 22–32)
Calcium: 8.9 mg/dL (ref 8.9–10.3)
Chloride: 99 mmol/L (ref 98–111)
Creatinine, Ser: 1.11 mg/dL — ABNORMAL HIGH (ref 0.44–1.00)
GFR, Estimated: 54 mL/min — ABNORMAL LOW (ref >60)
Glucose, Bld: 193 mg/dL — ABNORMAL HIGH (ref 70–99)
Potassium: 3.6 mmol/L (ref 3.5–5.1)
Sodium: 137 mmol/L (ref 135–145)

## 2022-04-02 LAB — BRAIN NATRIURETIC PEPTIDE: B Natriuretic Peptide: 185.1 pg/mL — ABNORMAL HIGH (ref 0.0–100.0)

## 2022-04-02 LAB — HEMOGLOBIN A1C
Hgb A1c MFr Bld: 8.3 % — ABNORMAL HIGH (ref 4.8–5.6)
Mean Plasma Glucose: 192 mg/dL

## 2022-04-02 MED ORDER — DAPAGLIFLOZIN PROPANEDIOL 10 MG PO TABS: 10.0000 mg | ORAL_TABLET | Freq: Every day | ORAL | 3 refills | Status: DC

## 2022-04-02 NOTE — Patient Instructions (Signed)
Thank you for coming in today  Labs were done today, if any labs are abnormal the clinic will call you No news is good news  Medications: START Farxiga 10 mg 1 tablet daily   Follow up appointments:  Your physician recommends that you schedule a follow-up appointment in:  8 weeks in clinic    Do the following things EVERYDAY: Weigh yourself in the morning before breakfast. Write it down and keep it in a log. Take your medicines as prescribed Eat low salt foods--Limit salt (sodium) to 2000 mg per day.  Stay as active as you can everyday Limit all fluids for the day to less than 2 liters   At the [facility], you and your health needs are our priority. As part of our continuing mission to provide you with exceptional heart care, we have created designated Provider Care Teams. These Care Teams include your primary Cardiologist (physician) and Advanced Practice Providers (APPs- Physician Assistants and Nurse Practitioners) who all work together to provide you with the care you need, when you need it.   You may see any of the following providers on your designated Care Team at your next follow up: Dr Glori Bickers Dr [name] Dr. Roxana Hires, NP Lyda Jester, [place] [facility] [place], [place] Forestine Na, NP Audry Riles, PharmD   Please be sure to bring in all your medications bottles to every appointment.    Thank you for choosing [facility]  If you have any questions or concerns before your next appointment please send [place] a message through [place] or call our office at [phone].    TO LEAVE A MESSAGE FOR THE NURSE SELECT OPTION 2, PLEASE LEAVE A MESSAGE INCLUDING: YOUR NAME DATE OF BIRTH CALL BACK NUMBER REASON FOR CALL**this is important as we prioritize the call backs  YOU WILL RECEIVE A CALL BACK THE SAME DAY AS LONG AS YOU CALL BEFORE 4:00 PM

## 2022-04-05 ENCOUNTER — Encounter (HOSPITAL_COMMUNITY): Payer: Self-pay

## 2022-04-05 ENCOUNTER — Ambulatory Visit (HOSPITAL_COMMUNITY)
Admission: EM | Admit: 2022-04-05 | Discharge: 2022-04-05 | Disposition: A | Discharge status: Home / Self Care | Payer: 59 | Attending: Family Medicine | Admitting: Family Medicine

## 2022-04-05 DIAGNOSIS — J069 Acute upper respiratory infection, unspecified: Secondary | ICD-10-CM | POA: Diagnosis not present

## 2022-04-05 DIAGNOSIS — R509 Fever, unspecified: Secondary | ICD-10-CM

## 2022-04-05 LAB — POC INFLUENZA A AND B ANTIGEN (URGENT CARE ONLY)
INFLUENZA A ANTIGEN, POC: NEGATIVE
INFLUENZA B ANTIGEN, POC: NEGATIVE

## 2022-04-05 MED ORDER — ACETAMINOPHEN 325 MG PO TABS
ORAL_TABLET | ORAL | Status: AC
  Filled 2022-04-05: qty 2

## 2022-04-05 MED ORDER — ACETAMINOPHEN 325 MG PO TABS
650.0000 mg | ORAL_TABLET | Freq: Once | ORAL | Status: AC
  Administered 2022-04-05: 650 mg via ORAL

## 2022-04-05 MED ORDER — BENZONATATE 100 MG PO CAPS: 100.0000 mg | ORAL_CAPSULE | Freq: Three times a day (TID) | ORAL | 0 refills | Status: DC

## 2022-04-05 NOTE — Discharge Instructions (Signed)
Your fever did come down with the Tylenol.  Please use Tylenol regularly to help keep your fever down.  I have called in Tessalon for cough.  I will contact you if you are positive for COVID.  Make sure that you rest and drink plenty of fluids.  If you have any worsening symptoms including shortness of breath, worsening cough, fever not responding to medication, nausea/vomiting interfering with oral intake, weakness, chest discomfort you need to go to the emergency room immediately.  Follow-up with your primary care first thing next week.

## 2022-04-05 NOTE — ED Triage Notes (Signed)
Pt presents to office for cough and congestion x 2 days. Pt reports coughing up mucous.

## 2022-04-05 NOTE — ED Provider Notes (Signed)
[place]    CSN: [id number] Arrival date & time: 04/05/22  1654      History   Chief Complaint Chief Complaint  Patient presents with   Cough    HPI [name] is a 72 y.o. female.   Patient presents today with a 2-day history of URI symptoms including cough, congestion, shortness of breath.  She denies any chest pain, nausea, vomiting, diarrhea.  She denies any known sick contacts.  She does have a history of heart failure but has been compliant with her medication and denies any sudden weight gain.  She has not tried any over-the-counter medication for symptom management.  Denies any recent antibiotics or steroids.  She denies history of allergies, asthma.  She does have a history of chronic bronchitis and is a former smoker quit 10 years ago.  She has had COVID with last episode several years ago.  She is up-to-date on COVID and influenza vaccines.  Reports she is feeling very poorly and believes this is related to the fever.    Past Medical History:  Diagnosis Date   Anemia    a. Noted on 07/2012 labs, instructed to f/u PCP.   Arthritis    "joints" (11/18/2012)   CAD (coronary artery disease), native coronary artery    a. Nonobstructive by cath 02/2012 (done because of low EF).   Chronic bronchitis ([place])    "~ every other year" (11/18/2012)   Chronic combined systolic and diastolic CHF (congestive heart failure) ([place])    a. 03/05/12 echo:  LVEF 20-25%, moderate LVH , inferior and basal to mid septal akinesis, anterior moderate to severe hypokinesis and grade 2 diastolic dysfunction. b. EF 07/2012: EF still 25% (unclear medication compliance).   Chronic lower back pain    Headache(784.0)    "often; maybe not daily" (11/18/2012)   High cholesterol    History of noncompliance with medical treatment    Hypertension    LBBB (left bundle branch block)    Orthopnea    Tobacco abuse    Type II diabetes mellitus [facility])     Patient Active Problem List   Diagnosis  Date Noted   Right foot infection 10/22/2017   Cellulitis in diabetic foot ([place]) 10/22/2017   Hyperglycemia 10/22/2017   Lumbar radiculopathy 09/18/2017   Degenerative spondylolisthesis 09/18/2017   Atrial fibrillation ([place]) 02/11/2017   Paroxysmal A-fib (H[facility])    Biventricular automatic implantable cardioverter defibrillator in situ    Sepsis ([place]) 04/20/2016   UTI (urinary tract infection) 04/20/2016   Dyspnea 07/19/2015   Interstitial lung disease ([place]) 11/20/2014   SIRS (systemic inflammatory response syndrome) ([place]) 02/03/2014   IDDM (insulin dependent diabetes mellitus) 02/03/2014   Tachycardia [id number]   Chronic systolic CHF (congestive heart failure) ([place]) 09/29/2012   Hypoxia 03/06/2012   Hypertensive heart disease 03/06/2012   Nonischemic cardiomyopathy ([place]) 03/06/2012   Tobacco abuse 03/06/2012   Type 2 diabetes mellitus ([place]) 03/06/2012   Hypertension 03/06/2012   CAD (coronary artery disease), native coronary artery 03/06/2012   Hypokalemia 03/06/2012   Acute bronchitis 02/01/2009   Sleep apnea 02/01/2009   Chest pain 02/01/2009    Past Surgical History:  Procedure Laterality Date   BI-VENTRICULAR IMPLANTABLE CARDIOVERTER DEFIBRILLATOR N/A 11/18/2012   Procedure: BI-VENTRICULAR IMPLANTABLE CARDIOVERTER DEFIBRILLATOR  (CRT-D);  Surgeon: Evans Lance, MD;  Location: [facility] CATH LAB;  Service: Cardiovascular;  Laterality: N/A;   BI-VENTRICULAR IMPLANTABLE CARDIOVERTER DEFIBRILLATOR  (CRT-D)  11/18/2012   BIV ICD GENERATOR CHANGEOUT N/A 11/22/2020  Procedure: BIV ICD GENERATOR CHANGEOUT;  Surgeon: Evans Lance, MD;  Location: [place] CV LAB;  Service: Cardiovascular;  Laterality: N/A;   CARDIAC CATHETERIZATION  03/04/12   nonobstructive CAD, elevated LVEDP and tortuous vessels suggestive of long-standing hypertension   COLONOSCOPY WITH PROPOFOL N/A 12/12/2016   Procedure: COLONOSCOPY WITH PROPOFOL;  Surgeon: Carol Ada, MD;  Location: [facility] ENDOSCOPY;  Service:  Endoscopy;  Laterality: N/A;   JOINT REPLACEMENT     Bilateral hip and right knee   LEFT HEART CATH N/A 03/05/2012   Procedure: LEFT HEART CATH;  Surgeon: Larey Dresser, MD;  [facility] C[facility] LAB;  Service: Cardiovascular;  Laterality: N/A;    OB History   No obstetric history on file.      Home Medications    Prior to Admission medications   Medication Sig Start Date End Date Taking? Authorizing Provider  apixaban (ELIQUIS) 5 MG TABS tablet TAKE 1 TABLET(5 MG) BY MOUTH TWICE DAILY 08/31/20  Yes Larey Dresser, MD  atorvastatin (LIPITOR) 40 MG tablet Take 1 tablet by mouth once daily 02/14/21  Yes Larey Dresser, MD  benzonatate (TESSALON) 100 MG capsule Take 1 capsule (100 mg total) by mouth every 8 (eight) hours. 04/05/22  Yes Ailyn Gladd K, PA-C  carvedilol (COREG) 25 MG tablet TAKE ONE TABLET BY MOUTH TWICE A DAY WITH MEALS 01/03/21  Yes Larey Dresser, MD  dapagliflozin propanediol (FARXIGA) 10 MG TABS tablet Take 1 tablet (10 mg total) by mouth daily before breakfast. 04/02/22  Yes Rosita Fire, Brittainy M, PA-C  ENTRESTO 97-103 MG TAKE ONE TABLET BY MOUTH TWICE A DAY 01/03/21  Yes Larey Dresser, MD  gabapentin (NEURONTIN) 100 MG capsule Take 100 mg by mouth 3 (three) times daily. 03/10/18  Yes [provider]  glucose blood (FREESTYLE TEST STRIPS) test strip Use as instructed 04/25/16  Yes Ghimire, Henreitta Leber, MD  glucose monitoring kit (FREESTYLE) monitoring kit 1 each by Does not apply route 4 (four) times daily - after meals and at bedtime. 1 month Diabetic Testing Supplies for QAC-QHS accuchecks. 04/25/16  Yes Ghimire, Henreitta Leber, MD  HUMALOG KWIKPEN 100 UNIT/ML KwikPen Sliding scale 02/12/18  Yes [provider]  insulin glargine (LANTUS) 100 UNIT/ML Solostar Pen Inject 50 Units into the skin at bedtime.   Yes [provider]  Insulin Pen Needle 32G X 8 MM MISC Use as directed 06/18/16  Yes Arbutus Leas, NP  Lancets (FREESTYLE) lancets Use as instructed  06/18/16  Yes Arbutus Leas, NP  metFORMIN (GLUMETZA) 1000 MG (MOD) 24 hr tablet Patient takes 2 tablets by mouth at bedtime.   Yes [provider]  oxyCODONE (ROXICODONE) 5 MG immediate release tablet Take 1 tablet (5 mg total) by mouth every 4 (four) hours as needed for severe pain. 06/14/21  Yes Smoot, Sarah A, PA-C  potassium chloride (KLOR-CON M) 10 MEQ tablet Take 10 mEq by mouth 2 (two) times daily.   Yes [provider]  Tafamidis (VYNDAMAX) 61 MG CAPS TAKE 1 CAPSULE (61 MG) BY MOUTH DAILY. 10/14/21 10/14/22 Yes Larey Dresser, MD  torsemide (DEMADEX) 20 MG tablet Patient takes 3 tablets by mouth at bedtime.   Yes [provider]    Family History Family History  Problem Relation Age of Onset   Heart failure Mother    Heart disease Neg Hx     Social History Social History   Tobacco Use   Smoking status: Former    Packs/day: 1.00  Years: 40.00    Total pack years: 40.00    Types: Cigarettes    Start date: 06/23/1972    Quit date: 03/26/2012    Years since quitting: 10.0   Smokeless tobacco: Never  Vaping Use   Vaping Use: Never used  Substance Use Topics   Alcohol use: No   Drug use: No     Allergies   Morphine and related, Penicillamine, and Penicillins   Review of Systems Review of Systems  Constitutional:  Positive for activity change, fatigue and fever. Negative for appetite change.  HENT:  Positive for congestion. Negative for sinus pressure, sneezing and sore throat.   Respiratory:  Positive for cough and shortness of breath.   Cardiovascular:  Negative for chest pain.  Gastrointestinal:  Negative for abdominal pain, diarrhea, nausea and vomiting.  Neurological:  Negative for dizziness, light-headedness and headaches.     Physical Exam Triage Vital Signs ED Triage Vitals [04/05/22 1758]  Enc Vitals Group     BP 120/73     Pulse Rate (!) 116     Resp 16     Temp (!) 101.2 F (38.4 C)     Temp Source Oral     SpO2 100 %      Weight      Height      Head Circumference      Peak Flow      Pain Score      Pain Loc      Pain Edu?      Excl. in [place]?    No data found.  Updated Vital Signs BP 99/65 (BP Location: Left Arm)   Pulse 99   Temp 99 F (37.2 C) (Oral)   Resp 16   SpO2 96%   Visual Acuity Right Eye Distance:   Left Eye Distance:   Bilateral Distance:    Right Eye Near:   Left Eye Near:    Bilateral Near:     Physical Exam Vitals reviewed.  Constitutional:      General: She is awake. She is not in acute distress.    Appearance: Normal appearance. She is well-developed. She is ill-appearing.     Comments: Very pleasant female laying on exam table in no acute distress  HENT:     Head: Normocephalic and atraumatic.     Right Ear: Tympanic membrane, ear canal and external ear normal. Tympanic membrane is not erythematous or bulging.     Left Ear: Tympanic membrane, ear canal and external ear normal. Tympanic membrane is not erythematous or bulging.     Nose:     Right Sinus: No maxillary sinus tenderness or frontal sinus tenderness.     Left Sinus: No maxillary sinus tenderness or frontal sinus tenderness.     Mouth/Throat:     Dentition: Has dentures.     Pharynx: Uvula midline. Posterior oropharyngeal erythema present. No oropharyngeal exudate.  Cardiovascular:     Rate and Rhythm: Regular rhythm. Tachycardia present.     Heart sounds: Normal heart sounds, S1 normal and S2 normal. No murmur heard. Pulmonary:     Effort: Pulmonary effort is normal.     Breath sounds: Normal breath sounds. No wheezing, rhonchi or rales.     Comments: Clear to auscultation bilaterally Musculoskeletal:     Right lower leg: No edema.     Left lower leg: No edema.  Psychiatric:        Behavior: Behavior is cooperative.      UC Treatments /  Results  Labs (all labs ordered are listed, but only abnormal results are displayed) Labs Reviewed  SARS CORONAVIRUS 2 (TAT 6-24 HRS)  POC INFLUENZA A AND  B ANTIGEN (URGENT CARE ONLY)    EKG   Radiology No results found.  Procedures Procedures (including critical care time)  Medications Ordered in UC Medications  acetaminophen (TYLENOL) tablet 650 mg (650 mg Oral Given 04/05/22 1809)    Initial Impression / Assessment and Plan / UC Course  I have reviewed the triage vital signs and the nursing notes.  Pertinent labs & imaging results that were available during my care of the patient were reviewed by me and considered in my medical decision making (see chart for details).     Patient was initially febrile and tachycardic and we discussed that she meets SIRS criteria and it would be reasonable to go to the emergency room given her age and comorbidities.  She declined to do this due to concern for weight.  She was given Tylenol in clinic with resolution of fever and tachycardia.  No evidence of acute infection on physical exam that warrant initiation of antibiotics.  She tested negative for influenza.  She was tested for COVID-19.  She is a candidate for antiviral therapy if positive for COVID but given her chronic anticoagulation cannot take Paxlovid.  Will consider molnupiravir if she is positive.  She was given Tessalon for cough.  Recommended that she continue Tylenol on regular basis to manage her fever.  She can use over-the-counter medications such as Mucinex and Flonase for additional symptom relief.  Recommend close follow-up with her primary care first thing next week.  We discussed at length that if she has any changing or worsening symptoms including fever not responding to medication, chest pain, shortness of breath, worsening cough, weakness, nausea/vomiting she needs to be seen immediately.  Strict return precautions given.  Final Clinical Impressions(s) / UC Diagnoses   Final diagnoses:  Upper respiratory tract infection, unspecified type  Fever, unspecified     Discharge Instructions      Your fever did come down with  the Tylenol.  Please use Tylenol regularly to help keep your fever down.  I have called in Tessalon for cough.  I will contact you if you are positive for COVID.  Make sure that you rest and drink plenty of fluids.  If you have any worsening symptoms including shortness of breath, worsening cough, fever not responding to medication, nausea/vomiting interfering with oral intake, weakness, chest discomfort you need to go to the emergency room immediately.  Follow-up with your primary care first thing next week.     ED Prescriptions     Medication Sig Dispense Auth. Provider   benzonatate (TESSALON) 100 MG capsule Take 1 capsule (100 mg total) by mouth every 8 (eight) hours. 21 capsule Judith Demps K, PA-C      PDMP not reviewed this encounter.   Terrilee Croak, PA-C 04/05/22 1920

## 2022-04-06 ENCOUNTER — Encounter (HOSPITAL_COMMUNITY): Payer: Self-pay | Admitting: Emergency Medicine

## 2022-04-06 ENCOUNTER — Emergency Department (HOSPITAL_COMMUNITY)
Admission: EM | Admit: 2022-04-06 | Discharge: 2022-04-06 | Disposition: A | Discharge status: Home / Self Care | Payer: 59 | Attending: Emergency Medicine | Admitting: Emergency Medicine

## 2022-04-06 ENCOUNTER — Emergency Department (HOSPITAL_COMMUNITY): Payer: 59

## 2022-04-06 ENCOUNTER — Other Ambulatory Visit: Payer: Self-pay

## 2022-04-06 DIAGNOSIS — Z7901 Long term (current) use of anticoagulants: Secondary | ICD-10-CM | POA: Insufficient documentation

## 2022-04-06 DIAGNOSIS — Z794 Long term (current) use of insulin: Secondary | ICD-10-CM | POA: Diagnosis not present

## 2022-04-06 DIAGNOSIS — R051 Acute cough: Secondary | ICD-10-CM

## 2022-04-06 DIAGNOSIS — U071 COVID-19: Secondary | ICD-10-CM | POA: Diagnosis not present

## 2022-04-06 DIAGNOSIS — R0602 Shortness of breath: Secondary | ICD-10-CM | POA: Diagnosis present

## 2022-04-06 LAB — CBC
HCT: 33.2 % — ABNORMAL LOW (ref 36.0–46.0)
Hemoglobin: 11.4 g/dL — ABNORMAL LOW (ref 12.0–15.0)
MCH: 31 pg (ref 26.0–34.0)
MCHC: 34.3 g/dL (ref 30.0–36.0)
MCV: 90.2 fL (ref 80.0–100.0)
Platelets: 325 10*3/uL (ref 150–400)
RBC: 3.68 MIL/uL — ABNORMAL LOW (ref 3.87–5.11)
RDW: 12.3 % (ref 11.5–15.5)
WBC: 8.3 10*3/uL (ref 4.0–10.5)
nRBC: 0 % (ref 0.0–0.2)

## 2022-04-06 LAB — BASIC METABOLIC PANEL
Anion gap: 10 (ref 5–15)
BUN: 23 mg/dL (ref 8–23)
CO2: 28 mmol/L (ref 22–32)
Calcium: 8.9 mg/dL (ref 8.9–10.3)
Chloride: 98 mmol/L (ref 98–111)
Creatinine, Ser: 1.29 mg/dL — ABNORMAL HIGH (ref 0.44–1.00)
GFR, Estimated: 45 mL/min — ABNORMAL LOW (ref >60)
Glucose, Bld: 191 mg/dL — ABNORMAL HIGH (ref 70–99)
Potassium: 3.4 mmol/L — ABNORMAL LOW (ref 3.5–5.1)
Sodium: 136 mmol/L (ref 135–145)

## 2022-04-06 LAB — TROPONIN I (HIGH SENSITIVITY)
Troponin I (High Sensitivity): 20 ng/L — ABNORMAL HIGH (ref <18)
Troponin I (High Sensitivity): 18 ng/L — ABNORMAL HIGH

## 2022-04-06 LAB — RESP PANEL BY RT-PCR (RSV, FLU A&B, COVID)  RVPGX2
Influenza A by PCR: NEGATIVE
Influenza B by PCR: NEGATIVE
Resp Syncytial Virus by PCR: NEGATIVE
SARS Coronavirus 2 by RT PCR: POSITIVE — AB

## 2022-04-06 LAB — BRAIN NATRIURETIC PEPTIDE: B Natriuretic Peptide: 103.2 pg/mL — ABNORMAL HIGH (ref 0.0–100.0)

## 2022-04-06 LAB — MAGNESIUM: Magnesium: 1.9 mg/dL (ref 1.7–2.4)

## 2022-04-06 MED ORDER — MOLNUPIRAVIR EUA 200MG CAPSULE: 4.0000 | ORAL_CAPSULE | Freq: Two times a day (BID) | ORAL | 0 refills | Status: AC

## 2022-04-06 MED ORDER — ACETAMINOPHEN 500 MG PO TABS
1000.0000 mg | ORAL_TABLET | Freq: Once | ORAL | Status: AC
  Administered 2022-04-06: 1000 mg via ORAL
  Filled 2022-04-06: qty 2

## 2022-04-06 MED ORDER — HYDROCODONE BIT-HOMATROP MBR 5-1.5 MG/5ML PO SOLN
5.0000 mL | Freq: Once | ORAL | Status: AC
  Administered 2022-04-06: 5 mL via ORAL
  Filled 2022-04-06: qty 5

## 2022-04-06 MED ORDER — ALBUTEROL SULFATE HFA 108 (90 BASE) MCG/ACT IN AERS
2.0000 | INHALATION_SPRAY | RESPIRATORY_TRACT | Status: DC | PRN
  Administered 2022-04-06: 2 via RESPIRATORY_TRACT
  Filled 2022-04-06: qty 6.7

## 2022-04-06 NOTE — ED Triage Notes (Signed)
Pt reports cough and chest pain accompanied w/ SOB.  PT states she was seen at Urgent care and prescribed medication however he has not gotten the cough medication yet.  Pt states it feels like I have a lot of fluid on my chest, "I have been sleeping w/ a lot of pillows."

## 2022-04-06 NOTE — ED Provider Notes (Signed)
Patient seen after prior EDP.  Patient is feeling improved after ED evaluation.    Patient's COVID test is positive.  Presentation is consistent with acute COVID infection.  Patient is agreeable with plan to discharge.  Patient understands need for close outpatient follow-up.  Strict return precautions given and understood.  Prescription for antiviral made to the patient.  Patient understands that given that she is currently on day 4 or 5 of symptoms efficacy of antiviral may be limited.     Valarie Merino, MD 04/06/22 [phone]

## 2022-04-06 NOTE — ED Provider Notes (Signed)
[facility] Provider Note   CSN: [id number] Arrival date & time: 04/06/22  0505     History  Chief Complaint  Patient presents with   Cough   Chest Pain    [name] is a 72 y.o. female.  72 yo F here with cough chest pain and fever. Has been going on a few days but worse yesterday. Went to UC, given Rx for cough meds. Persistent green productive cough. Kept her awake all night feeling bad so she came here for eval. No sick contacts. No other recent illnesses.   Cough Associated symptoms: chest pain   Chest Pain Associated symptoms: cough        Home Medications Prior to Admission medications   Medication Sig Start Date End Date Taking? Authorizing Provider  apixaban (ELIQUIS) 5 MG TABS tablet TAKE 1 TABLET(5 MG) BY MOUTH TWICE DAILY 08/31/20   Larey Dresser, MD  atorvastatin (LIPITOR) 40 MG tablet Take 1 tablet by mouth once daily 02/14/21   Larey Dresser, MD  benzonatate (TESSALON) 100 MG capsule Take 1 capsule (100 mg total) by mouth every 8 (eight) hours. 04/05/22   Raspet, Derry Skill, PA-C  carvedilol (COREG) 25 MG tablet TAKE ONE TABLET BY MOUTH TWICE A DAY WITH MEALS 01/03/21   Larey Dresser, MD  dapagliflozin propanediol (FARXIGA) 10 MG TABS tablet Take 1 tablet (10 mg total) by mouth daily before breakfast. 04/02/22   Lyda Jester M, PA-C  ENTRESTO 97-103 MG TAKE ONE TABLET BY MOUTH TWICE A DAY 01/03/21   Larey Dresser, MD  gabapentin (NEURONTIN) 100 MG capsule Take 100 mg by mouth 3 (three) times daily. 03/10/18   [provider]  glucose blood (FREESTYLE TEST STRIPS) test strip Use as instructed 04/25/16   Ghimire, Henreitta Leber, MD  glucose monitoring kit (FREESTYLE) monitoring kit 1 each by Does not apply route 4 (four) times daily - after meals and at bedtime. 1 month Diabetic Testing Supplies for QAC-QHS accuchecks. 04/25/16   Ghimire, Henreitta Leber, MD  HUMALOG KWIKPEN 100 UNIT/ML KwikPen Sliding scale  02/12/18   [provider]  insulin glargine (LANTUS) 100 UNIT/ML Solostar Pen Inject 50 Units into the skin at bedtime.    [provider]  Insulin Pen Needle 32G X 8 MM MISC Use as directed 06/18/16   Arbutus Leas, NP  Lancets (FREESTYLE) lancets Use as instructed 06/18/16   Arbutus Leas, NP  metFORMIN (GLUMETZA) 1000 MG (MOD) 24 hr tablet Patient takes 2 tablets by mouth at bedtime.    [provider]  oxyCODONE (ROXICODONE) 5 MG immediate release tablet Take 1 tablet (5 mg total) by mouth every 4 (four) hours as needed for severe pain. 06/14/21   Smoot, Sarah A, PA-C  potassium chloride (KLOR-CON M) 10 MEQ tablet Take 10 mEq by mouth 2 (two) times daily.    [provider]  Tafamidis (VYNDAMAX) 61 MG CAPS TAKE 1 CAPSULE (61 MG) BY MOUTH DAILY. 10/14/21 10/14/22  Larey Dresser, MD  torsemide (DEMADEX) 20 MG tablet Patient takes 3 tablets by mouth at bedtime.    [provider]      Allergies    Morphine and related, Penicillamine, and Penicillins    Review of Systems   Review of Systems  Respiratory:  Positive for cough.   Cardiovascular:  Positive for chest pain.    Physical Exam Updated Vital Signs BP (!) 146/71   Pulse 98  Temp 99.6 F (37.6 C)   Resp (!) 25   Ht '5\' 7"'$  (1.702 m)   Wt 101.2 kg   SpO2 98%   BMI 34.93 kg/m  Physical Exam  ED Results / Procedures / Treatments   Labs (all labs ordered are listed, but only abnormal results are displayed) Labs Reviewed  BASIC METABOLIC PANEL - Abnormal; Notable for the following components:      Result Value   Potassium 3.4 (*)    Glucose, Bld 191 (*)    Creatinine, Ser 1.29 (*)    GFR, Estimated 45 (*)    All other components within normal limits  CBC - Abnormal; Notable for the following components:   RBC 3.68 (*)    Hemoglobin 11.4 (*)    HCT 33.2 (*)    All other components within normal limits  TROPONIN I (HIGH SENSITIVITY) - Abnormal; Notable for the following  components:   Troponin I (High Sensitivity) 20 (*)    All other components within normal limits  RESP PANEL BY RT-PCR (RSV, FLU A&B, COVID)  RVPGX2  BRAIN NATRIURETIC PEPTIDE  MAGNESIUM    EKG EKG Interpretation  Date/Time:  Sunday April 06 2022 05:29:00 EDT Ventricular Rate:  111 PR Interval:  128 QRS Duration: 138 QT Interval:  402 QTC Calculation: 546 R Axis:   262 Text Interpretation: Atrial-sensed ventricular-paced rhythm Abnormal ECG When compared with ECG of 09-Mar-2022 16:34, PREVIOUS ECG IS PRESENT Confirmed by Merrily Pew [phone]) on 04/06/2022 6:00:02 AM  Radiology DG Chest 2 View  Result Date: 04/06/2022 CLINICAL DATA:  72 year old female with history of shortness of breath. EXAM: CHEST - 2 VIEW COMPARISON:  Chest x-ray 03/09/2022. FINDINGS: Lung volumes are normal. No consolidative airspace disease. No pleural effusions. No pneumothorax. No pulmonary nodule or mass noted. Pulmonary vasculature and the cardiomediastinal silhouette are within normal limits. Atherosclerosis in the thoracic aorta. Left-sided pacemaker/AICD in place with lead tips projecting over the expected location of the right atrium, right ventricle and overlying the anterolateral wall the left ventricle via the coronary sinus and coronary veins. IMPRESSION: 1. No radiographic evidence of acute cardiopulmonary disease. 2. Aortic atherosclerosis. Electronically Signed   By: Vinnie Langton M.D.   On: 04/06/2022 06:06    Procedures Procedures    Medications Ordered in ED Medications  albuterol (VENTOLIN HFA) 108 (90 Base) MCG/ACT inhaler 2 puff (has no administration in time range)  HYDROcodone bit-homatropine (HYCODAN) 5-1.5 MG/5ML syrup 5 mL (5 mLs Oral Given 04/06/22 0620)  acetaminophen (TYLENOL) tablet 1,000 mg (1,000 mg Oral Given 04/06/22 [id number])    ED Course/ Medical Decision Making/ A&P                             Medical Decision Making Amount and/or Complexity of Data Reviewed Labs:  ordered. Radiology: ordered.  Risk OTC drugs. Prescription drug management.  Sinusitis vs viral vs CAP. Will treat symptomatically. Has chest pain with it too, likely related to inflammation/coughing - - > will check cardiac labs.  Considered PE however sounds infectious and is compliant with eliquis, will hold on this evaluation unless another cause can't be found.   Care transferred pending completion of labs and reevaluation for disposition.   Final Clinical Impression(s) / ED Diagnoses Final diagnoses:  None    Rx / DC Orders ED Discharge Orders     None         Angelyn Osterberg, Corene Cornea, MD 04/06/22 2305

## 2022-04-06 NOTE — Discharge Instructions (Signed)
Return for any problem.  ?

## 2022-04-09 ENCOUNTER — Other Ambulatory Visit (HOSPITAL_COMMUNITY): Payer: 59

## 2022-04-24 ENCOUNTER — Other Ambulatory Visit (HOSPITAL_COMMUNITY): Payer: Self-pay

## 2022-04-24 MED ORDER — VYNDAMAX 61 MG PO CAPS: ORAL_CAPSULE | ORAL | 3 refills | Status: DC

## 2022-04-25 ENCOUNTER — Other Ambulatory Visit (HOSPITAL_COMMUNITY): Payer: Self-pay

## 2022-04-25 MED ORDER — ENTRESTO 97-103 MG PO TABS: 1.0000 | ORAL_TABLET | Freq: Two times a day (BID) | ORAL | 2 refills | Status: DC

## 2022-04-28 ENCOUNTER — Other Ambulatory Visit (HOSPITAL_COMMUNITY): Payer: Self-pay

## 2022-04-28 MED ORDER — VYNDAMAX 61 MG PO CAPS: ORAL_CAPSULE | ORAL | 3 refills | Status: DC

## 2022-05-07 NOTE — Progress Notes (Signed)
Remote ICD transmission.   

## 2022-05-08 ENCOUNTER — Other Ambulatory Visit (HOSPITAL_COMMUNITY): Payer: Self-pay

## 2022-05-08 ENCOUNTER — Telehealth (HOSPITAL_COMMUNITY): Payer: Self-pay

## 2022-05-08 NOTE — Telephone Encounter (Signed)
Patient called because she still has not received her Vyndamax. I called [facility] pharmacy and confirmed they have on file, however, they need to verify her home address before sending. I called patient and gave her the phone number to call them directly and she stated she would call today.

## 2022-05-09 ENCOUNTER — Other Ambulatory Visit: Payer: Self-pay

## 2022-05-09 ENCOUNTER — Other Ambulatory Visit (HOSPITAL_COMMUNITY): Payer: Self-pay

## 2022-05-09 MED ORDER — VYNDAMAX 61 MG PO CAPS
61.0000 mg | ORAL_CAPSULE | Freq: Every day | ORAL | 3 refills | Status: DC
  Filled 2022-05-09: qty 30, 30d supply, fill #0
  Filled 2022-05-09: qty 90, fill #0
  Filled 2022-06-16: qty 30, 30d supply, fill #0
  Filled 2022-07-11: qty 30, 30d supply, fill #1
  Filled 2022-08-08: qty 30, 30d supply, fill #2
  Filled 2022-09-01: qty 30, 30d supply, fill #3
  Filled 2022-10-06: qty 30, 30d supply, fill #4
  Filled 2022-11-17: qty 30, 30d supply, fill #5
  Filled 2022-12-09: qty 30, 30d supply, fill #6
  Filled 2023-01-05: qty 30, 30d supply, fill #7
  Filled 2023-02-25: qty 30, 30d supply, fill #8
  Filled 2023-03-24: qty 30, 30d supply, fill #9
  Filled 2023-04-20 (×2): qty 30, 30d supply, fill #10

## 2022-05-09 NOTE — Addendum Note (Signed)
Addended by: Theresia Bough on: 05/09/2022 11:24 AM   Modules accepted: Orders

## 2022-05-20 ENCOUNTER — Other Ambulatory Visit (HOSPITAL_COMMUNITY): Payer: Self-pay

## 2022-05-21 ENCOUNTER — Telehealth (HOSPITAL_COMMUNITY): Payer: Self-pay | Admitting: Pharmacy Technician

## 2022-05-21 ENCOUNTER — Other Ambulatory Visit: Payer: Self-pay

## 2022-05-21 ENCOUNTER — Other Ambulatory Visit (HOSPITAL_COMMUNITY): Payer: Self-pay

## 2022-05-21 NOTE — Telephone Encounter (Signed)
Advanced Heart Failure Patient Advocate Encounter  The patient has previously been filling Vyndamax through [facility] specialty pharmacy. Looks like she had the RX sent to [facility] for delivery but has not called them back to confirm her address. I attempted to call both numbers associated with the patient to see which pharmacy she would like to use moving forward, neither number would go through.   She has not filled Vyndamax through our specialty pharmacy since December 2023. Will inactivate in specialty pharmacy platform at this time.   Archer Asa, CPhT

## 2022-05-26 ENCOUNTER — Telehealth (HOSPITAL_COMMUNITY): Payer: Self-pay

## 2022-05-26 NOTE — Telephone Encounter (Signed)
Called and left patient a voice message to remind patient of her appointment on 05/27/22

## 2022-05-26 NOTE — Telephone Encounter (Signed)
Called to confirm/remind patient of their appointment at the [facility] on 05/27/22.

## 2022-05-27 ENCOUNTER — Encounter (HOSPITAL_COMMUNITY): Payer: 59

## 2022-06-14 ENCOUNTER — Emergency Department (HOSPITAL_COMMUNITY)
Admission: EM | Admit: 2022-06-14 | Discharge: 2022-06-14 | Disposition: A | Discharge status: Home / Self Care | Payer: 59 | Attending: Emergency Medicine | Admitting: Emergency Medicine

## 2022-06-14 ENCOUNTER — Other Ambulatory Visit: Payer: Self-pay

## 2022-06-14 ENCOUNTER — Emergency Department (HOSPITAL_COMMUNITY): Payer: 59

## 2022-06-14 DIAGNOSIS — R110 Nausea: Secondary | ICD-10-CM | POA: Insufficient documentation

## 2022-06-14 DIAGNOSIS — R55 Syncope and collapse: Secondary | ICD-10-CM | POA: Insufficient documentation

## 2022-06-14 DIAGNOSIS — I2510 Atherosclerotic heart disease of native coronary artery without angina pectoris: Secondary | ICD-10-CM | POA: Diagnosis not present

## 2022-06-14 DIAGNOSIS — Z7984 Long term (current) use of oral hypoglycemic drugs: Secondary | ICD-10-CM | POA: Diagnosis not present

## 2022-06-14 DIAGNOSIS — I509 Heart failure, unspecified: Secondary | ICD-10-CM | POA: Insufficient documentation

## 2022-06-14 DIAGNOSIS — I110 Hypertensive heart disease with heart failure: Secondary | ICD-10-CM | POA: Diagnosis not present

## 2022-06-14 DIAGNOSIS — R42 Dizziness and giddiness: Secondary | ICD-10-CM | POA: Insufficient documentation

## 2022-06-14 DIAGNOSIS — Z7901 Long term (current) use of anticoagulants: Secondary | ICD-10-CM | POA: Diagnosis not present

## 2022-06-14 DIAGNOSIS — Z794 Long term (current) use of insulin: Secondary | ICD-10-CM | POA: Insufficient documentation

## 2022-06-14 DIAGNOSIS — I4891 Unspecified atrial fibrillation: Secondary | ICD-10-CM | POA: Insufficient documentation

## 2022-06-14 DIAGNOSIS — Z79899 Other long term (current) drug therapy: Secondary | ICD-10-CM | POA: Diagnosis not present

## 2022-06-14 DIAGNOSIS — E119 Type 2 diabetes mellitus without complications: Secondary | ICD-10-CM | POA: Diagnosis not present

## 2022-06-14 LAB — CBC WITH DIFFERENTIAL/PLATELET
Abs Immature Granulocytes: 0.04 10*3/uL (ref 0.00–0.07)
Basophils Absolute: 0.1 10*3/uL (ref 0.0–0.1)
Basophils Relative: 1 %
Eosinophils Absolute: 0.2 10*3/uL (ref 0.0–0.5)
Eosinophils Relative: 2 %
HCT: 32.4 % — ABNORMAL LOW (ref 36.0–46.0)
Hemoglobin: 10.8 g/dL — ABNORMAL LOW (ref 12.0–15.0)
Immature Granulocytes: 0 %
Lymphocytes Relative: 23 %
Lymphs Abs: 2.4 10*3/uL (ref 0.7–4.0)
MCH: 30.1 pg (ref 26.0–34.0)
MCHC: 33.3 g/dL (ref 30.0–36.0)
MCV: 90.3 fL (ref 80.0–100.0)
Monocytes Absolute: 0.9 10*3/uL (ref 0.1–1.0)
Monocytes Relative: 9 %
Neutro Abs: 6.9 10*3/uL (ref 1.7–7.7)
Neutrophils Relative %: 65 %
Platelets: 326 10*3/uL (ref 150–400)
RBC: 3.59 MIL/uL — ABNORMAL LOW (ref 3.87–5.11)
RDW: 12.7 % (ref 11.5–15.5)
WBC: 10.5 10*3/uL (ref 4.0–10.5)
nRBC: 0 % (ref 0.0–0.2)

## 2022-06-14 LAB — BASIC METABOLIC PANEL
Anion gap: 17 — ABNORMAL HIGH (ref 5–15)
BUN: 21 mg/dL (ref 8–23)
CO2: 25 mmol/L (ref 22–32)
Calcium: 9.3 mg/dL (ref 8.9–10.3)
Chloride: 100 mmol/L (ref 98–111)
Creatinine, Ser: 1.77 mg/dL — ABNORMAL HIGH (ref 0.44–1.00)
GFR, Estimated: 31 mL/min — ABNORMAL LOW (ref >60)
Glucose, Bld: 67 mg/dL — ABNORMAL LOW (ref 70–99)
Potassium: 3.5 mmol/L (ref 3.5–5.1)
Sodium: 142 mmol/L (ref 135–145)

## 2022-06-14 LAB — TROPONIN I (HIGH SENSITIVITY): Troponin I (High Sensitivity): 17 ng/L (ref <18)

## 2022-06-14 MED ORDER — SODIUM CHLORIDE 0.9 % IV BOLUS
500.0000 mL | Freq: Once | INTRAVENOUS | Status: AC
  Administered 2022-06-14: 500 mL via INTRAVENOUS

## 2022-06-14 NOTE — ED Triage Notes (Signed)
[name] is a 72 y.o. female arrived via ems for dizziness reports was cooking all day and began to feel dizzy went to lay down and called ems bgl 89 which is normal per pt pt does have demand pacemaker and has reproducible chest pain to upper left chest

## 2022-06-14 NOTE — ED Notes (Signed)
Pt to CT

## 2022-06-14 NOTE — Discharge Instructions (Signed)
Follow up with your Physicain for recheck next week Return if any problems.,

## 2022-06-14 NOTE — ED Provider Notes (Signed)
[name] EMERGENCY DEPARTMENT AT [facility] Provider Note   CSN: [id number] Arrival date & time: 06/14/22  1332     History  Chief Complaint  Patient presents with   Dizziness    [name] is a 72 y.o. female.  Patient with history of hypertension, CAD, CHF, A-fib on Elliquis, diabetes presents today with complaints of syncope. She states that patient was cooking in her kitchen earlier today and suddenly felt sweaty and lightheaded and nauseated and subsequently had a syncopal episode. She states that she fell to her knees, did not hit her head or loose consciousness. She regained consciousness when her knees hit the floor and then crawled to her bed to check her sugar and noted it to be in the 80s. She called EMS for same and was told given that she has a Facilities manager they recommended that she come to the ER for evaluation.  Patient currently denies any symptoms.  States that she has not had anything to eat or drink today.  She denies fevers, chills, cough, congestion, chest pain, shortness of breath, vomiting, diarrhea, or abdominal pain.  States that she has had a similar event previously several years ago but was not seen for it.  The history is provided by the patient. No language interpreter was used.  Dizziness      Home Medications Prior to Admission medications   Medication Sig Start Date End Date Taking? Authorizing Provider  apixaban (ELIQUIS) 5 MG TABS tablet TAKE 1 TABLET(5 MG) BY MOUTH TWICE DAILY 08/31/20   Laurey Morale, MD  atorvastatin (LIPITOR) 40 MG tablet Take 1 tablet by mouth once daily 02/14/21   Laurey Morale, MD  benzonatate (TESSALON) 100 MG capsule Take 1 capsule (100 mg total) by mouth every 8 (eight) hours. 04/05/22   Raspet, Noberto Retort, PA-C  carvedilol (COREG) 25 MG tablet TAKE ONE TABLET BY MOUTH TWICE A DAY WITH MEALS 01/03/21   Laurey Morale, MD  dapagliflozin propanediol (FARXIGA) 10 MG TABS tablet Take 1 tablet (10 mg  total) by mouth daily before breakfast. 04/02/22   Robbie Lis M, PA-C  gabapentin (NEURONTIN) 100 MG capsule Take 100 mg by mouth 3 (three) times daily. 03/10/18   [provider]  glucose blood (FREESTYLE TEST STRIPS) test strip Use as instructed 04/25/16   Ghimire, Werner Lean, MD  glucose monitoring kit (FREESTYLE) monitoring kit 1 each by Does not apply route 4 (four) times daily - after meals and at bedtime. 1 month Diabetic Testing Supplies for QAC-QHS accuchecks. 04/25/16   Ghimire, Werner Lean, MD  HUMALOG KWIKPEN 100 UNIT/ML KwikPen Sliding scale 02/12/18   [provider]  insulin glargine (LANTUS) 100 UNIT/ML Solostar Pen Inject 50 Units into the skin at bedtime.    [provider]  Insulin Pen Needle 32G X 8 MM MISC Use as directed 06/18/16   Little Ishikawa, NP  Lancets (FREESTYLE) lancets Use as instructed 06/18/16   Little Ishikawa, NP  metFORMIN (GLUMETZA) 1000 MG (MOD) 24 hr tablet Patient takes 2 tablets by mouth at bedtime.    [provider]  oxyCODONE (ROXICODONE) 5 MG immediate release tablet Take 1 tablet (5 mg total) by mouth every 4 (four) hours as needed for severe pain. 06/14/21   Leonore Frankson A, PA-C  potassium chloride (KLOR-CON M) 10 MEQ tablet Take 10 mEq by mouth 2 (two) times daily.    [provider]  sacubitril-valsartan (ENTRESTO) 97-103 MG Take 1 tablet by  mouth 2 (two) times daily. 04/25/22   Laurey Morale, MD  Tafamidis (VYNDAMAX) 61 MG CAPS TAKE 1 CAPSULE (61 MG) BY MOUTH DAILY. 05/09/22   Laurey Morale, MD  torsemide (DEMADEX) 20 MG tablet Patient takes 3 tablets by mouth at bedtime.    [provider]      Allergies    Morphine and codeine, Penicillamine, and Penicillins    Review of Systems   Review of Systems  Neurological:  Positive for syncope.  All other systems reviewed and are negative.   Physical Exam Updated Vital Signs BP 114/61   Pulse 95   Resp 16   Ht 5\' 7"  (1.702 m)   Wt 101.2 kg    SpO2 95%   BMI 34.94 kg/m  Physical Exam Vitals and nursing note reviewed.  Constitutional:      General: She is not in acute distress.    Appearance: Normal appearance. She is normal weight. She is not ill-appearing, toxic-appearing or diaphoretic.  HENT:     Head: Normocephalic and atraumatic.  Eyes:     Extraocular Movements: Extraocular movements intact.     Pupils: Pupils are equal, round, and reactive to light.  Cardiovascular:     Rate and Rhythm: Normal rate and regular rhythm.     Heart sounds: Normal heart sounds.  Pulmonary:     Effort: Pulmonary effort is normal. No respiratory distress.     Breath sounds: Normal breath sounds.  Abdominal:     General: Abdomen is flat.     Palpations: Abdomen is soft.     Tenderness: There is no abdominal tenderness.  Musculoskeletal:        General: Normal range of motion.     Cervical back: Normal range of motion and neck supple.  Skin:    General: Skin is warm and dry.  Neurological:     General: No focal deficit present.     Mental Status: She is alert and oriented to person, place, and time.  Psychiatric:        Mood and Affect: Mood normal.        Behavior: Behavior normal.     ED Results / Procedures / Treatments   Labs (all labs ordered are listed, but only abnormal results are displayed) Labs Reviewed  BASIC METABOLIC PANEL - Abnormal; Notable for the following components:      Result Value   Glucose, Bld 67 (*)    Creatinine, Ser 1.77 (*)    GFR, Estimated 31 (*)    Anion gap 17 (*)    All other components within normal limits  CBC WITH DIFFERENTIAL/PLATELET - Abnormal; Notable for the following components:   RBC 3.59 (*)    Hemoglobin 10.8 (*)    HCT 32.4 (*)    All other components within normal limits  URINALYSIS, ROUTINE W REFLEX MICROSCOPIC  BRAIN NATRIURETIC PEPTIDE  CBG MONITORING, ED  TROPONIN I (HIGH SENSITIVITY)  TROPONIN I (HIGH SENSITIVITY)    EKG EKG Interpretation  Date/Time:  Saturday  Jun 14 2022 13:52:01 EDT Ventricular Rate:  99 PR Interval:  147 QRS Duration: 137 QT Interval:  444 QTC Calculation: 570 R Axis:   257 Text Interpretation: Sinus rhythm Right bundle branch block similar to Apr 06 2022 Confirmed by Pricilla Loveless [phone]) on 06/14/2022 2:27:07 PM  Radiology No results found.  Procedures Procedures    Medications Ordered in ED Medications  sodium chloride 0.9 % bolus 500 mL (0 mLs Intravenous Stopped 06/14/22 1649)  ED Course/ Medical Decision Making/ A&P                             Medical Decision Making Amount and/or Complexity of Data Reviewed Labs: ordered.   This patient is a 72 y.o. female who presents to the ED for concern of syncope, this involves an extensive number of treatment options, and is a complaint that carries with it a high risk of complications and morbidity. The emergent differential diagnosis prior to evaluation includes, but is not limited to,  CVA, ACS, arrhythmia, vasovagal syncope, orthostatic hypotension, sepsis, hypoglycemia, electrolyte disturbance, respiratory failure, symptomatic anemia, dehydration, heat injury, polypharmacy, malignancy, anxiety/panic attack.   This is not an exhaustive differential.   Past Medical History / Co-morbidities / Social History: hypertension, CAD, CHF, A-fib on Elliquis, diabetes. Patient with [employer] pacemaker  Physical Exam: Physical exam performed. The pertinent findings include: no pertinent physical exam findings  Lab Tests: Labs pending at shift change   Imaging Studies: I ordered imaging studies including CT head. Study pending at shift change.   Cardiac Monitoring:  The patient was maintained on a cardiac monitor.  My attending physician Dr. Criss Alvine viewed and interpreted the cardiac monitored which showed an underlying rhythm of: RBBB. I agree with this interpretation.   Medications: I ordered medication including fluids  for hypovolemia. Reevaluation pending at  shift change  Consultations Obtained: Pacemaker interrogated, received a phone call from [employer] who reviewed the patient's pacemaker/defibrillator said that around the time of syncopal episode patient's heart rate was in the 130s but no arrhythmia, no shocks delivered.  Did note an elevated OptiVol which can indicate fluid overload and heart failure.  Disposition:  Patient presents today with complaints of syncope.  She is afebrile, nontoxic-appearing, and in no acute distress with reassuring vital signs.  Upon my evaluation, patient is currently asymptomatic.  Labs and imaging are pending and will determine dispo.  Suspect that if patient continues to feel well and her labs are benign she may be able to go home.  Would recommend checking orthostatics prior to discharge to ensure continued resolution of symptoms.  Care handoff to Langston Masker, PA-C at shift change.  Please see their note for continued evaluation and dispo.   Final Clinical Impression(s) / ED Diagnoses Final diagnoses:  None    Rx / DC Orders ED Discharge Orders     None         Vear Clock 06/14/22 1658    Pricilla Loveless, MD 06/15/22 1337

## 2022-06-16 ENCOUNTER — Telehealth (HOSPITAL_COMMUNITY): Payer: Self-pay

## 2022-06-16 ENCOUNTER — Other Ambulatory Visit (HOSPITAL_COMMUNITY): Payer: Self-pay

## 2022-06-16 ENCOUNTER — Other Ambulatory Visit: Payer: Self-pay

## 2022-06-16 ENCOUNTER — Telehealth (HOSPITAL_COMMUNITY): Payer: Self-pay | Admitting: Pharmacy Technician

## 2022-06-16 NOTE — Telephone Encounter (Signed)
Attempted to reach patient all numbers listed because she has been calling multiple times this morning.  She says she needs refills, but will not list the medications. If she calls back, please get what medications she needs.

## 2022-06-16 NOTE — Telephone Encounter (Signed)
Advanced Heart Failure Patient Advocate Encounter  Spoke with patient regarding Vyndamax. Reactivated her with [facility] speciality pharmacy. She should receive refill by Thursday. New cell phone number now in system.  Archer Asa, CPhT

## 2022-06-16 NOTE — Telephone Encounter (Signed)
Patient has called office numerous times today, being very abusive to staff about her medications. When I called the numbers listed, no answer. She called back and I called the pharmacy. They have been trying to reach her about her refills and she will not answer or call them back. I left her a message again giving her the pharmacy number and asking her to call them so they can refill her medications.

## 2022-07-02 ENCOUNTER — Ambulatory Visit (INDEPENDENT_AMBULATORY_CARE_PROVIDER_SITE_OTHER): Payer: 59

## 2022-07-02 DIAGNOSIS — I428 Other cardiomyopathies: Secondary | ICD-10-CM

## 2022-07-02 LAB — CUP PACEART REMOTE DEVICE CHECK
Battery Remaining Longevity: 79 mo
Battery Voltage: 2.98 V
Brady Statistic AP VP Percent: 0.12 %
Brady Statistic AP VS Percent: 0.01 %
Brady Statistic AS VP Percent: 98.2 %
Brady Statistic AS VS Percent: 1.66 %
Brady Statistic RA Percent Paced: 0.13 %
Brady Statistic RV Percent Paced: 23.53 %
Date Time Interrogation Session: 20240605043623
HighPow Impedance: 59 Ohm
Implantable Lead Connection Status: 753985
Implantable Lead Connection Status: 753985
Implantable Lead Connection Status: 753985
Implantable Lead Implant Date: 20141023
Implantable Lead Implant Date: 20141023
Implantable Lead Implant Date: 20141023
Implantable Lead Location: 753858
Implantable Lead Location: 753859
Implantable Lead Location: 753860
Implantable Lead Model: 4298
Implantable Lead Model: 5076
Implantable Lead Model: 6935
Implantable Pulse Generator Implant Date: 20221027
Lead Channel Impedance Value: 152 Ohm
Lead Channel Impedance Value: 152 Ohm
Lead Channel Impedance Value: 152 Ohm
Lead Channel Impedance Value: 165.029
Lead Channel Impedance Value: 165.029
Lead Channel Impedance Value: 304 Ohm
Lead Channel Impedance Value: 304 Ohm
Lead Channel Impedance Value: 304 Ohm
Lead Channel Impedance Value: 361 Ohm
Lead Channel Impedance Value: 361 Ohm
Lead Channel Impedance Value: 399 Ohm
Lead Channel Impedance Value: 456 Ohm
Lead Channel Impedance Value: 456 Ohm
Lead Channel Impedance Value: 532 Ohm
Lead Channel Impedance Value: 532 Ohm
Lead Channel Impedance Value: 532 Ohm
Lead Channel Impedance Value: 551 Ohm
Lead Channel Impedance Value: 589 Ohm
Lead Channel Pacing Threshold Amplitude: 0.625 V
Lead Channel Pacing Threshold Amplitude: 0.625 V
Lead Channel Pacing Threshold Amplitude: 1.5 V
Lead Channel Pacing Threshold Pulse Width: 0.4 ms
Lead Channel Pacing Threshold Pulse Width: 0.4 ms
Lead Channel Pacing Threshold Pulse Width: 0.4 ms
Lead Channel Sensing Intrinsic Amplitude: 12.875 mV
Lead Channel Sensing Intrinsic Amplitude: 12.875 mV
Lead Channel Sensing Intrinsic Amplitude: 2.875 mV
Lead Channel Sensing Intrinsic Amplitude: 2.875 mV
Lead Channel Setting Pacing Amplitude: 1.75 V
Lead Channel Setting Pacing Amplitude: 2.25 V
Lead Channel Setting Pacing Amplitude: 2.5 V
Lead Channel Setting Pacing Pulse Width: 0.4 ms
Lead Channel Setting Pacing Pulse Width: 0.4 ms
Lead Channel Setting Sensing Sensitivity: 0.45 mV
Zone Setting Status: 755011
Zone Setting Status: 755011

## 2022-07-03 ENCOUNTER — Other Ambulatory Visit (HOSPITAL_COMMUNITY): Payer: Self-pay

## 2022-07-08 ENCOUNTER — Other Ambulatory Visit (HOSPITAL_COMMUNITY): Payer: Self-pay

## 2022-07-11 ENCOUNTER — Other Ambulatory Visit (HOSPITAL_COMMUNITY): Payer: Self-pay

## 2022-07-17 ENCOUNTER — Telehealth: Payer: Self-pay | Admitting: *Deleted

## 2022-07-17 NOTE — Telephone Encounter (Signed)
   Pre-operative Risk Assessment    Patient Name: [name]  DOB: 10/04/1950 MRN: [id number]      Request for Surgical Clearance    Procedure:   Left Total Knee Arthroplasty.  Date of Surgery:  Clearance TBD                                 Surgeon:  Dr. Gean Birchwood Surgeon's Group or Practice Name:  [facility] Phone number:  [phone] Fax number:  [phone]   Type of Clearance Requested:   - Medical  - Pharmacy:  Hold Apixaban (Eliquis) Not Indicated.    Type of Anesthesia:  Spinal   Additional requests/questions:    Signed, Emmit Pomfret   07/17/2022, 10:28 AM

## 2022-07-18 NOTE — Telephone Encounter (Signed)
Left message for the pt to call back as to pre op appt. Per pre op APP says to schedule tele , but pt does now have appt with Dr. Ladona Ridgel 08/14/22.

## 2022-07-18 NOTE — Telephone Encounter (Signed)
   Name: [name]  DOB: [birth year]  MRN: [id number]  Primary Cardiologist: Marca Ancona, MD   Preoperative team, please contact this patient and set up a phone call appointment for further preoperative risk assessment. Please obtain consent and complete medication review. Thank you for your help.  I confirm that guidance regarding antiplatelet and oral anticoagulation therapy has been completed and, if necessary, noted below.  Pharmacy has provided recommendations for holding anticoagulation   Ronney Asters, NP 07/18/2022, 10:45 AM  [facility]

## 2022-07-18 NOTE — Telephone Encounter (Signed)
Patient with diagnosis of atrial fibrillation on Eliquis for anticoagulation.    Procedure: left total knee arthroplasty Date of procedure: TBD   CHA2DS2-VASc Score = 6   This indicates a 9.7% annual risk of stroke. The patient's score is based upon: CHF History: 1 HTN History: 1 Diabetes History: 1 Stroke History: 0 Vascular Disease History: 1 Age Score: 1 Gender Score: 1    CrCl 49 Platelet count 326  Per office protocol, patient can hold Eliquis for 3 days prior to procedure.   Patient will not need bridging with Lovenox (enoxaparin) around procedure.  **This guidance is not considered finalized until pre-operative APP has relayed final recommendations.**

## 2022-07-21 ENCOUNTER — Telehealth: Payer: Self-pay | Admitting: *Deleted

## 2022-07-21 NOTE — Telephone Encounter (Signed)
Pt has been scheduled for tele pre op appt. Pt said surgeon is waiting to schedule until they hear back from cardiology. Pt asked to do ASAP. Med rec and consent are done.

## 2022-07-21 NOTE — Telephone Encounter (Signed)
Pt has been scheduled for tele pre op appt. Pt said surgeon is waiting to schedule until they hear back from cardiology. Pt asked to do ASAP. Med rec and consent are done.      Patient Consent for Virtual Visit        [name] has provided verbal consent on 07/21/2022 for a virtual visit (video or telephone).   CONSENT FOR VIRTUAL VISIT FOR:  [name]  By participating in this virtual visit I agree to the following:  I hereby voluntarily request, consent and authorize [facility] and its employed or contracted physicians, physician assistants, nurse practitioners or other licensed health care professionals (the Practitioner), to provide me with telemedicine health care services (the "Services") as deemed necessary by the treating Practitioner. I acknowledge and consent to receive the Services by the Practitioner via telemedicine. I understand that the telemedicine visit will involve communicating with the Practitioner through live audiovisual communication technology and the disclosure of certain medical information by electronic transmission. I acknowledge that I have been given the opportunity to request an in-person assessment or other available alternative prior to the telemedicine visit and am voluntarily participating in the telemedicine visit.  I understand that I have the right to withhold or withdraw my consent to the use of telemedicine in the course of my care at any time, without affecting my right to future care or treatment, and that the Practitioner or I may terminate the telemedicine visit at any time. I understand that I have the right to inspect all information obtained and/or recorded in the course of the telemedicine visit and may receive copies of available information for a reasonable fee.  I understand that some of the potential risks of receiving the Services via telemedicine include:  Delay or interruption in medical evaluation due to technological  equipment failure or disruption; Information transmitted may not be sufficient (e.g. poor resolution of images) to allow for appropriate medical decision making by the Practitioner; and/or  In rare instances, security protocols could fail, causing a breach of personal health information.  Furthermore, I acknowledge that it is my responsibility to provide information about my medical history, conditions and care that is complete and accurate to the best of my ability. I acknowledge that Practitioner's advice, recommendations, and/or decision may be based on factors not within their control, such as incomplete or inaccurate data provided by me or distortions of diagnostic images or specimens that may result from electronic transmissions. I understand that the practice of medicine is not an exact science and that Practitioner makes no warranties or guarantees regarding treatment outcomes. I acknowledge that a copy of this consent can be made available to me via my patient portal [facility] MyChart), or I can request a printed copy by calling the office of [facility].    I understand that my insurance will be billed for this visit.   I have read or had this consent read to me. I understand the contents of this consent, which adequately explains the benefits and risks of the Services being provided via telemedicine.  I have been provided ample opportunity to ask questions regarding this consent and the Services and have had my questions answered to my satisfaction. I give my informed consent for the services to be provided through the use of telemedicine in my medical care

## 2022-07-25 ENCOUNTER — Ambulatory Visit: Payer: 59 | Attending: Cardiovascular Disease

## 2022-07-25 DIAGNOSIS — Z01810 Encounter for preprocedural cardiovascular examination: Secondary | ICD-10-CM

## 2022-07-25 NOTE — Progress Notes (Signed)
Virtual Visit via Telephone Note   Because of [name] Captain's co-morbid illnesses, she is at least at moderate risk for complications without adequate follow up.  This format is felt to be most appropriate for this patient at this time.  The patient did not have access to video technology/had technical difficulties with video requiring transitioning to audio format only (telephone).  All issues noted in this document were discussed and addressed.  No physical exam could be performed with this format.  Please refer to the patient's chart for her consent to telehealth for [facility].  Evaluation Performed:  Preoperative cardiovascular risk assessment _____________   Date:  07/25/2022   Patient ID:  [name], DOB 09-14-1950, MRN [id number] Patient Location:  Home Provider location:   Office  Primary Care Provider:  [facility] Primary Cardiologist:  Marca Ancona, MD  Chief Complaint / Patient Profile   72 y.o. y/o female with a h/o NICM, nonobstructive CAD, LBBB s/p Medtronic CRT-D 10/2012 who is pending left total knee arthroplasty and presents today for telephonic preoperative cardiovascular risk assessment.  History of Present Illness    [name] is a 72 y.o. female who presents via audio/video conferencing for a telehealth visit today.  Pt was last seen in cardiology clinic on 04/02/2022 by Boyce Medici, PA. At that time [name] was doing well with controlled blood pressures at home but fluid trending up by OptiVol on device.The patient is now pending procedure as outlined above. Since her last visit, she has been doing well with no recurrence of swelling in lower extremities.    Past Medical History    Past Medical History:  Diagnosis Date   Anemia    a. Noted on 07/2012 labs, instructed to f/u PCP.   Arthritis    "joints" (11/18/2012)   CAD (coronary artery disease), native coronary artery    a. Nonobstructive by cath  02/2012 (done because of low EF).   Chronic bronchitis (HCC)    "~ every other year" (11/18/2012)   Chronic combined systolic and diastolic CHF (congestive heart failure) (HCC)    a. 03/05/12 echo:  LVEF 20-25%, moderate LVH , inferior and basal to mid septal akinesis, anterior moderate to severe hypokinesis and grade 2 diastolic dysfunction. b. EF 07/2012: EF still 25% (unclear medication compliance).   Chronic lower back pain    Headache(784.0)    "often; maybe not daily" (11/18/2012)   High cholesterol    History of noncompliance with medical treatment    Hypertension    LBBB (left bundle branch block)    Orthopnea    Tobacco abuse    Type II diabetes mellitus (HCC)    Past Surgical History:  Procedure Laterality Date   BI-VENTRICULAR IMPLANTABLE CARDIOVERTER DEFIBRILLATOR N/A 11/18/2012   Procedure: BI-VENTRICULAR IMPLANTABLE CARDIOVERTER DEFIBRILLATOR  (CRT-D);  Surgeon: Marinus Maw, MD;  Location: [facility] CATH LAB;  Service: Cardiovascular;  Laterality: N/A;   BI-VENTRICULAR IMPLANTABLE CARDIOVERTER DEFIBRILLATOR  (CRT-D)  11/18/2012   BIV ICD GENERATOR CHANGEOUT N/A 11/22/2020   Procedure: BIV ICD GENERATOR CHANGEOUT;  Surgeon: Marinus Maw, MD;  Location: [facility] INVASIVE CV LAB;  Service: Cardiovascular;  Laterality: N/A;   CARDIAC CATHETERIZATION  03/04/12   nonobstructive CAD, elevated LVEDP and tortuous vessels suggestive of long-standing hypertension   COLONOSCOPY WITH PROPOFOL N/A 12/12/2016   Procedure: COLONOSCOPY WITH PROPOFOL;  Surgeon: Jeani Hawking, MD;  Location: [facility] ENDOSCOPY;  Service: Endoscopy;  Laterality: N/A;   JOINT REPLACEMENT  Bilateral hip and right knee   LEFT HEART CATH N/A 03/05/2012   Procedure: LEFT HEART CATH;  Surgeon: Laurey Morale, MD;  Location: [facility] CATH LAB;  Service: Cardiovascular;  Laterality: N/A;    Allergies  Allergies  Allergen Reactions   Morphine And Codeine Nausea Only    Severe nausea   Penicillamine Nausea And Vomiting    Penicillins Other (See Comments)    Pt states she has had a pain in her leg since a penicillin injection 2 months ago (reported 08/31/17).  Has tolerated amoxicillin oral.  Has patient had a PCN reaction causing immediate rash, facial/tongue/throat swelling, SOB or lightheadedness with hypotension: No Has patient had a PCN reaction causing severe rash involving mucus membranes or skin necrosis: No Has patient had a PCN reaction that required hospitalization: No Has patient had a PCN reaction occurring within the last 10 years: Yes If a    Home Medications    Prior to Admission medications   Medication Sig Start Date End Date Taking? Authorizing Provider  apixaban (ELIQUIS) 5 MG TABS tablet TAKE 1 TABLET(5 MG) BY MOUTH TWICE DAILY 08/31/20   Laurey Morale, MD  atorvastatin (LIPITOR) 40 MG tablet Take 1 tablet by mouth once daily 02/14/21   Laurey Morale, MD  benzonatate (TESSALON) 100 MG capsule Take 1 capsule (100 mg total) by mouth every 8 (eight) hours. 04/05/22   Raspet, Noberto Retort, PA-C  carvedilol (COREG) 25 MG tablet TAKE ONE TABLET BY MOUTH TWICE A DAY WITH MEALS 01/03/21   Laurey Morale, MD  dapagliflozin propanediol (FARXIGA) 10 MG TABS tablet Take 1 tablet (10 mg total) by mouth daily before breakfast. 04/02/22   Robbie Lis M, PA-C  gabapentin (NEURONTIN) 100 MG capsule Take 100 mg by mouth 3 (three) times daily. 03/10/18   [provider]  glucose blood (FREESTYLE TEST STRIPS) test strip Use as instructed 04/25/16   Ghimire, Werner Lean, MD  glucose monitoring kit (FREESTYLE) monitoring kit 1 each by Does not apply route 4 (four) times daily - after meals and at bedtime. 1 month Diabetic Testing Supplies for QAC-QHS accuchecks. 04/25/16   Ghimire, Werner Lean, MD  HUMALOG KWIKPEN 100 UNIT/ML KwikPen Sliding scale 02/12/18   [provider]  insulin glargine (LANTUS) 100 UNIT/ML Solostar Pen Inject 50 Units into the skin at bedtime.    [provider]  Insulin  Pen Needle 32G X 8 MM MISC Use as directed 06/18/16   Little Ishikawa, NP  Lancets (FREESTYLE) lancets Use as instructed 06/18/16   Little Ishikawa, NP  metFORMIN (GLUMETZA) 1000 MG (MOD) 24 hr tablet Patient takes 2 tablets by mouth at bedtime.    [provider]  oxyCODONE (ROXICODONE) 5 MG immediate release tablet Take 1 tablet (5 mg total) by mouth every 4 (four) hours as needed for severe pain. 06/14/21   Smoot, Sarah A, PA-C  potassium chloride (KLOR-CON M) 10 MEQ tablet Take 10 mEq by mouth 2 (two) times daily.    [provider]  sacubitril-valsartan (ENTRESTO) 97-103 MG Take 1 tablet by mouth 2 (two) times daily. 04/25/22   Laurey Morale, MD  Tafamidis (VYNDAMAX) 61 MG CAPS TAKE 1 CAPSULE (61 MG) BY MOUTH DAILY. 05/09/22   Laurey Morale, MD  torsemide (DEMADEX) 20 MG tablet Patient takes 3 tablets by mouth at bedtime.    [provider]    Physical Exam    Vital Signs:  [name] does not have vital  signs available for review today.  Given telephonic nature of communication, physical exam is limited. AAOx3. NAD. Normal affect.  Speech and respirations are unlabored.  Accessory Clinical Findings    None  Assessment & Plan    1.  Preoperative Cardiovascular Risk Assessment:  Patient's RCRI score is 11%  The patient affirms she has been doing well without any new cardiac symptoms. They are able to achieve 4 METS without cardiac limitations. Therefore, based on ACC/AHA guidelines, the patient would be at acceptable risk for the planned procedure without further cardiovascular testing. The patient was advised that if she develops new symptoms prior to surgery to contact our office to arrange for a follow-up visit, and she verbalized understanding.    The patient was advised that if she develops new symptoms prior to surgery to contact our office to arrange for a follow-up visit, and she verbalized understanding.  Patient can hold Plavix 3 days prior to  procedure and should restart postprocedure when surgically safe.  A copy of this note will be routed to requesting surgeon.  Time:   Today, I have spent 8 minutes with the patient with telehealth technology discussing medical history, symptoms, and management plan.     Napoleon Form, Leodis Rains, NP  07/25/2022, 7:14 AM

## 2022-07-28 NOTE — Progress Notes (Signed)
Remote ICD transmission.   

## 2022-08-04 ENCOUNTER — Other Ambulatory Visit (HOSPITAL_COMMUNITY): Payer: Self-pay

## 2022-08-08 ENCOUNTER — Other Ambulatory Visit (HOSPITAL_COMMUNITY): Payer: Self-pay

## 2022-08-14 ENCOUNTER — Ambulatory Visit: Payer: 59 | Attending: Internal Medicine | Admitting: Internal Medicine

## 2022-08-14 ENCOUNTER — Encounter: Payer: Self-pay | Admitting: Internal Medicine

## 2022-08-14 VITALS — BP 128/70 | HR 99 | Ht 67.0 in | Wt 224.6 lb

## 2022-08-14 DIAGNOSIS — I5022 Chronic systolic (congestive) heart failure: Secondary | ICD-10-CM

## 2022-08-14 DIAGNOSIS — Z95810 Presence of automatic (implantable) cardiac defibrillator: Secondary | ICD-10-CM | POA: Diagnosis not present

## 2022-08-14 LAB — CUP PACEART INCLINIC DEVICE CHECK
Battery Remaining Longevity: 77 mo
Battery Voltage: 2.98 V
Brady Statistic AP VP Percent: 0.15 %
Brady Statistic AP VS Percent: 0.01 %
Brady Statistic AS VP Percent: 98.22 %
Brady Statistic AS VS Percent: 1.62 %
Brady Statistic RA Percent Paced: 0.17 %
Brady Statistic RV Percent Paced: 29.72 %
Date Time Interrogation Session: 20240718131907
HighPow Impedance: 64 Ohm
Implantable Lead Connection Status: 753985
Implantable Lead Connection Status: 753985
Implantable Lead Connection Status: 753985
Implantable Lead Implant Date: 20141023
Implantable Lead Implant Date: 20141023
Implantable Lead Implant Date: 20141023
Implantable Lead Location: 753858
Implantable Lead Location: 753859
Implantable Lead Location: 753860
Implantable Lead Model: 4298
Implantable Lead Model: 5076
Implantable Lead Model: 6935
Implantable Pulse Generator Implant Date: 20221027
Lead Channel Impedance Value: 171 Ohm
Lead Channel Impedance Value: 171 Ohm
Lead Channel Impedance Value: 171 Ohm
Lead Channel Impedance Value: 184.154
Lead Channel Impedance Value: 184.154
Lead Channel Impedance Value: 342 Ohm
Lead Channel Impedance Value: 342 Ohm
Lead Channel Impedance Value: 342 Ohm
Lead Channel Impedance Value: 399 Ohm
Lead Channel Impedance Value: 399 Ohm
Lead Channel Impedance Value: 456 Ohm
Lead Channel Impedance Value: 456 Ohm
Lead Channel Impedance Value: 532 Ohm
Lead Channel Impedance Value: 589 Ohm
Lead Channel Impedance Value: 589 Ohm
Lead Channel Impedance Value: 589 Ohm
Lead Channel Impedance Value: 608 Ohm
Lead Channel Impedance Value: 608 Ohm
Lead Channel Pacing Threshold Amplitude: 0.5 V
Lead Channel Pacing Threshold Amplitude: 0.625 V
Lead Channel Pacing Threshold Amplitude: 0.75 V
Lead Channel Pacing Threshold Amplitude: 1 V
Lead Channel Pacing Threshold Amplitude: 1.375 V
Lead Channel Pacing Threshold Pulse Width: 0.4 ms
Lead Channel Pacing Threshold Pulse Width: 0.4 ms
Lead Channel Pacing Threshold Pulse Width: 0.4 ms
Lead Channel Pacing Threshold Pulse Width: 0.4 ms
Lead Channel Pacing Threshold Pulse Width: 0.4 ms
Lead Channel Sensing Intrinsic Amplitude: 12.375 mV
Lead Channel Sensing Intrinsic Amplitude: 13.875 mV
Lead Channel Sensing Intrinsic Amplitude: 3 mV
Lead Channel Sensing Intrinsic Amplitude: 3.25 mV
Lead Channel Setting Pacing Amplitude: 1.75 V
Lead Channel Setting Pacing Amplitude: 2.25 V
Lead Channel Setting Pacing Amplitude: 2.5 V
Lead Channel Setting Pacing Pulse Width: 0.4 ms
Lead Channel Setting Pacing Pulse Width: 0.4 ms
Lead Channel Setting Sensing Sensitivity: 0.45 mV
Zone Setting Status: 755011
Zone Setting Status: 755011

## 2022-08-14 NOTE — Patient Instructions (Signed)
Medication Instructions:  Your physician recommends that you continue on your current medications as directed. Please refer to the Current Medication list given to you today.  *If you need a refill on your cardiac medications before your next appointment, please call your pharmacy*   Lab Work: None ordered.  If you have labs (blood work) drawn today and your tests are completely normal, you will receive your results only by: MyChart Message (if you have MyChart) OR A paper copy in the mail If you have any lab test that is abnormal or we need to change your treatment, we will call you to review the results.   Testing/Procedures: None ordered.    Follow-Up: At [facility], you and your health needs are our priority.  As part of our continuing mission to provide you with exceptional heart care, we have created designated Provider Care Teams.  These Care Teams include your primary Cardiologist (physician) and Advanced Practice Providers (APPs -  Physician Assistants and Nurse Practitioners) who all work together to provide you with the care you need, when you need it.  We recommend signing up for the patient portal called "MyChart".  Sign up information is provided on this After Visit Summary.  MyChart is used to connect with patients for Virtual Visits (Telemedicine).  Patients are able to view lab/test results, encounter notes, upcoming appointments, etc.  Non-urgent messages can be sent to your provider as well.   To learn more about what you can do with MyChart, go to [email or web address].    Your next appointment:   12 months with Dr Ladona Ridgel

## 2022-08-14 NOTE — Progress Notes (Signed)
HPI [name] returns today for followup of her biV ICD and chronic systolic heart failure. She had a nice improvement of her EF after BIV ICD insertion. In the interim, she has done well. No ICD shocks.  No PND and no orthopnea. She has not had syncope or ICD shock. She is bothered mostly by left knee pain and is pending knee replacement surgery. Allergies  Allergen Reactions   Morphine And Codeine Nausea Only    Severe nausea   Penicillamine Nausea And Vomiting   Penicillins Other (See Comments)    Pt states she has had a pain in her leg since a penicillin injection 2 months ago (reported 08/31/17).  Has tolerated amoxicillin oral.  Has patient had a PCN reaction causing immediate rash, facial/tongue/throat swelling, SOB or lightheadedness with hypotension: No Has patient had a PCN reaction causing severe rash involving mucus membranes or skin necrosis: No Has patient had a PCN reaction that required hospitalization: No Has patient had a PCN reaction occurring within the last 10 years: Yes If a     Current Outpatient Medications  Medication Sig Dispense Refill   apixaban (ELIQUIS) 5 MG TABS tablet TAKE 1 TABLET(5 MG) BY MOUTH TWICE DAILY 180 tablet 2   atorvastatin (LIPITOR) 40 MG tablet Take 1 tablet by mouth once daily 90 tablet 3   benzonatate (TESSALON) 100 MG capsule Take 1 capsule (100 mg total) by mouth every 8 (eight) hours. 21 capsule 0   carvedilol (COREG) 25 MG tablet TAKE ONE TABLET BY MOUTH TWICE A DAY WITH MEALS 180 tablet 2   dapagliflozin propanediol (FARXIGA) 10 MG TABS tablet Take 1 tablet (10 mg total) by mouth daily before breakfast. 90 tablet 3   gabapentin (NEURONTIN) 100 MG capsule Take 100 mg by mouth 3 (three) times daily.     glucose blood (FREESTYLE TEST STRIPS) test strip Use as instructed 100 each 0   glucose monitoring kit (FREESTYLE) monitoring kit 1 each by Does not apply route 4 (four) times daily - after meals and at bedtime. 1 month Diabetic  Testing Supplies for QAC-QHS accuchecks. 1 each 1   HUMALOG KWIKPEN 100 UNIT/ML KwikPen Sliding scale     insulin glargine (LANTUS) 100 UNIT/ML Solostar Pen Inject 50 Units into the skin at bedtime.     Insulin Pen Needle 32G X 8 MM MISC Use as directed 100 each 0   Lancets (FREESTYLE) lancets Use as instructed 100 each 0   metFORMIN (GLUMETZA) 1000 MG (MOD) 24 hr tablet Patient takes 2 tablets by mouth at bedtime.     oxyCODONE (ROXICODONE) 5 MG immediate release tablet Take 1 tablet (5 mg total) by mouth every 4 (four) hours as needed for severe pain. 30 tablet 0   potassium chloride (KLOR-CON M) 10 MEQ tablet Take 10 mEq by mouth 2 (two) times daily.     sacubitril-valsartan (ENTRESTO) 97-103 MG Take 1 tablet by mouth 2 (two) times daily. 180 tablet 2   Tafamidis (VYNDAMAX) 61 MG CAPS TAKE 1 CAPSULE (61 MG) BY MOUTH DAILY. 90 capsule 3   torsemide (DEMADEX) 20 MG tablet Patient takes 3 tablets by mouth at bedtime.     No current facility-administered medications for this visit.     Past Medical History:  Diagnosis Date   Anemia    a. Noted on 07/2012 labs, instructed to f/u PCP.   Arthritis    "joints" (11/18/2012)   CAD (coronary artery disease), native coronary artery  a. Nonobstructive by cath 02/2012 (done because of low EF).   Chronic bronchitis (HCC)    "~ every other year" (11/18/2012)   Chronic combined systolic and diastolic CHF (congestive heart failure) (HCC)    a. 03/05/12 echo:  LVEF 20-25%, moderate LVH , inferior and basal to mid septal akinesis, anterior moderate to severe hypokinesis and grade 2 diastolic dysfunction. b. EF 07/2012: EF still 25% (unclear medication compliance).   Chronic lower back pain    Headache(784.0)    "often; maybe not daily" (11/18/2012)   High cholesterol    History of noncompliance with medical treatment    Hypertension    LBBB (left bundle branch block)    Orthopnea    Tobacco abuse    Type II diabetes mellitus (HCC)     ROS:    All systems reviewed and negative except as noted in the HPI.   Past Surgical History:  Procedure Laterality Date   BI-VENTRICULAR IMPLANTABLE CARDIOVERTER DEFIBRILLATOR N/A 11/18/2012   Procedure: BI-VENTRICULAR IMPLANTABLE CARDIOVERTER DEFIBRILLATOR  (CRT-D);  Surgeon: Marinus Maw, MD;  Location[facility] CATH LAB;  Service: Cardiovascular;  Laterality: N/A;   BI-VENTRICULAR IMPLANTABLE CARDIOVERTER DEFIBRILLATOR  (CRT-D)  11/18/2012   BIV ICD GENERATOR CHANGEOUT N/A 11/22/2020   Procedure: BIV ICD GENERATOR CHANGEOUT;  Surgeon: Marinus Maw, MD;  Location: [facility] INVASIVE CV LAB;  Service: Cardiovascular;  Laterality: N/A;   CARDIAC CATHETERIZATION  03/04/12   nonobstructive CAD, elevated LVEDP and tortuous vessels suggestive of long-standing hypertension   COLONOSCOPY WITH PROPOFOL N/A 12/12/2016   Procedure: COLONOSCOPY WITH PROPOFOL;  Surgeon: Jeani Hawking, MD;  Location: [facility] ENDOSCOPY;  Service: Endoscopy;  Laterality: N/A;   JOINT REPLACEMENT     Bilateral hip and right knee   LEFT HEART CATH N/A 03/05/2012   Procedure: LEFT HEART CATH;  Surgeon: Laurey Morale, MD;  Location: [facility] CATH LAB;  Service: Cardiovascular;  Laterality: N/A;     Family History  Problem Relation Age of Onset   Heart failure Mother    Heart disease Neg Hx      Social History   Socioeconomic History   Marital status: Single    Spouse name: Not on file   Number of children: 4   Years of education: 66   Highest education level: Not on file  Occupational History   Not on file  Tobacco Use   Smoking status: Former    Current packs/day: 0.00    Average packs/day: 1 pack/day for 40.0 years (40.0 ttl pk-yrs)    Types: Cigarettes    Start date: 06/23/1972    Quit date: 03/26/2012    Years since quitting: 10.3   Smokeless tobacco: Never  Vaping Use   Vaping status: Never Used  Substance and Sexual Activity   Alcohol use: No   Drug use: No   Sexual activity: Yes  Other Topics Concern   Not on file   Social History Narrative   Not on file   Social Determinants of Health   Financial Resource Strain: Low Risk  (01/14/2018)   Overall Financial Resource Strain (CARDIA)    Difficulty of Paying Living Expenses: Not hard at all  Food Insecurity: No Food Insecurity (01/14/2018)   Hunger Vital Sign    Worried About Running Out of Food in the Last Year: Never true    Ran Out of Food in the Last Year: Never true  Transportation Needs: No Transportation Needs (01/14/2018)   PRAPARE - Transportation    Lack of Transportation (  Medical): No    Lack of Transportation (Non-Medical): No  Physical Activity: Not on file  Stress: Not on file  Social Connections: Unknown (04/02/2022)   Received from [facility]   Social Network    Social Network: Not on file  Intimate Partner Violence: Unknown (04/02/2022)   [facility] from [facility], [facility]   HITS    Physically Hurt: Not on file    Insult or Talk Down To: Not on file    Threaten Physical Harm: Not on file    Scream or Curse: Not on file     BP 128/70   Pulse 99   Ht 5\' 7"  (1.702 m)   Wt 224 lb 9.6 oz (101.9 kg)   SpO2 97%   BMI 35.18 kg/m   Physical Exam:  Well appearing NAD HEENT: Unremarkable Neck:  No JVD, no thyromegally Lymphatics:  No adenopathy Back:  No CVA tenderness Lungs:  Clear with no wheezes HEART:  Regular rate rhythm, no murmurs, no rubs, no clicks Abd:  soft, positive bowel sounds, no organomegally, no rebound, no guarding Ext:  2 plus pulses, no edema, no cyanosis, no clubbing Skin:  No rashes no nodules Neuro:  CN II through XII intact, motor grossly intact  EKG - NSR with biv pacing  DEVICE  Normal device function.  See PaceArt for details.   Assess/Plan:  1. Chronic systolic heart failure - she appears to be doing well and her EF has improved after BiV insertion. She will continue her current meds.  2. HTN - her blood pressure is well controlled.  3. ICD - her medtronic BiV  ICD is working normally. She is approaching ERI.  4. Dyslipidemia - she will continue her lipitor.  5. Preop eval - the patient is an acceptable surgical candidate for knee replacement surgery and may proceed. She can stop her eliquis for up to 3 days before the surgery ( the half life of eliquis is 8 hours) and restart the eliquis when the bleeding risk is acceptable.    Leonia Reeves.D

## 2022-08-29 ENCOUNTER — Other Ambulatory Visit: Payer: Self-pay

## 2022-09-01 ENCOUNTER — Other Ambulatory Visit (HOSPITAL_COMMUNITY): Payer: Self-pay

## 2022-09-04 ENCOUNTER — Ambulatory Visit (INDEPENDENT_AMBULATORY_CARE_PROVIDER_SITE_OTHER): Payer: 59 | Admitting: Podiatry

## 2022-09-04 DIAGNOSIS — E1142 Type 2 diabetes mellitus with diabetic polyneuropathy: Secondary | ICD-10-CM

## 2022-09-04 DIAGNOSIS — R600 Localized edema: Secondary | ICD-10-CM

## 2022-09-04 NOTE — Progress Notes (Signed)
Subjective:  Patient ID: [name], female    DOB: [birth year],  MRN: [id number]  Chief Complaint  Patient presents with   Foot Swelling    Right foot swelling x 1 month    72 y.o. female presents with concern for right foot edema.  She also notes numbness and feels like she is walking on pillows or balled up sock on her feet.  Patient states she does have a history of diabetes.  Her last A1c was in the high 8 range.  She does take insulin.  Patient does not wear compression stockings and has never been evaluated by vascular specialist  Past Medical History:  Diagnosis Date   Anemia    a. Noted on 07/2012 labs, instructed to f/u PCP.   Arthritis    "joints" (11/18/2012)   CAD (coronary artery disease), native coronary artery    a. Nonobstructive by cath 02/2012 (done because of low EF).   Chronic bronchitis (HCC)    "~ every other year" (11/18/2012)   Chronic combined systolic and diastolic CHF (congestive heart failure) (HCC)    a. 03/05/12 echo:  LVEF 20-25%, moderate LVH , inferior and basal to mid septal akinesis, anterior moderate to severe hypokinesis and grade 2 diastolic dysfunction. b. EF 07/2012: EF still 25% (unclear medication compliance).   Chronic lower back pain    Headache(784.0)    "often; maybe not daily" (11/18/2012)   High cholesterol    History of noncompliance with medical treatment    Hypertension    LBBB (left bundle branch block)    Orthopnea    Tobacco abuse    Type II diabetes mellitus (HCC)     Allergies  Allergen Reactions   Morphine And Codeine Nausea Only    Severe nausea   Penicillamine Nausea And Vomiting   Penicillins Other (See Comments)    Pt states she has had a pain in her leg since a penicillin injection 2 months ago (reported 08/31/17).  Has tolerated amoxicillin oral.  Has patient had a PCN reaction causing immediate rash, facial/tongue/throat swelling, SOB or lightheadedness with hypotension: No Has patient had a PCN reaction causing  severe rash involving mucus membranes or skin necrosis: No Has patient had a PCN reaction that required hospitalization: No Has patient had a PCN reaction occurring within the last 10 years: Yes If a    ROS: Negative except as per HPI above  Objective:  General: AAO x3, NAD  Dermatological: Nails thickened elongated and dystrophic x 5 bilateral foot.  No open wounds or preulcerative calluses  Vascular:  Dorsalis Pedis artery and Posterior Tibial artery pedal pulses are 2/4 bilateral.  Capillary fill time < 3 sec to all digits.   Neruologic: Grossly absent via light touch to the level of the ankle bilaterally protective sensation is absent  Musculoskeletal: Mild edema of the right foot and ankle area.    Gait: Unassisted, Nonantalgic.   No images are attached to the encounter.  Radiographs:  Deferred Assessment:   1. Edema of right foot   2. DM type 2 with diabetic peripheral neuropathy (HCC)      Plan:  Patient was evaluated and treated and all questions answered.  # Edema of right foot likely representing venous insufficiency -Referral to [facility] for further evaluation -Possible need for compression stockings  # DM2 with significant peripheral neuropathy with loss of protective sensation -Discussed with patient she does have neuropathy which explains her numbness in her bilateral foot. -Explained that we  cannot reverse the numbness in her foot however she can prevent worsening of neuropathy by controlling her A1c and lowering her blood sugars -Continue to work on lowering A1c level -Denies any burning tingling pins and needle sensation no evidence of need for gabapentin at this time  Return in about 6 months (around 03/07/2023) for DM exam.          Corinna Gab, DPM [facility] / [facility]

## 2022-09-05 ENCOUNTER — Other Ambulatory Visit (HOSPITAL_COMMUNITY): Payer: Self-pay

## 2022-10-01 ENCOUNTER — Other Ambulatory Visit (HOSPITAL_COMMUNITY): Payer: Self-pay

## 2022-10-01 ENCOUNTER — Ambulatory Visit (INDEPENDENT_AMBULATORY_CARE_PROVIDER_SITE_OTHER): Payer: 59

## 2022-10-01 DIAGNOSIS — I5022 Chronic systolic (congestive) heart failure: Secondary | ICD-10-CM

## 2022-10-01 LAB — CUP PACEART REMOTE DEVICE CHECK
Battery Remaining Longevity: 72 mo
Battery Voltage: 2.98 V
Brady Statistic AP VP Percent: 0.08 %
Brady Statistic AP VS Percent: 0.01 %
Brady Statistic AS VP Percent: 98.36 %
Brady Statistic AS VS Percent: 1.55 %
Brady Statistic RA Percent Paced: 0.1 %
Brady Statistic RV Percent Paced: 42.07 %
Date Time Interrogation Session: 20240904001705
HighPow Impedance: 64 Ohm
Implantable Lead Connection Status: 753985
Implantable Lead Connection Status: 753985
Implantable Lead Connection Status: 753985
Implantable Lead Implant Date: 20141023
Implantable Lead Implant Date: 20141023
Implantable Lead Implant Date: 20141023
Implantable Lead Location: 753858
Implantable Lead Location: 753859
Implantable Lead Location: 753860
Implantable Lead Model: 4298
Implantable Lead Model: 5076
Implantable Lead Model: 6935
Implantable Pulse Generator Implant Date: 20221027
Lead Channel Impedance Value: 175.622
Lead Channel Impedance Value: 175.622
Lead Channel Impedance Value: 180.5 Ohm
Lead Channel Impedance Value: 184.154
Lead Channel Impedance Value: 189.525
Lead Channel Impedance Value: 342 Ohm
Lead Channel Impedance Value: 361 Ohm
Lead Channel Impedance Value: 361 Ohm
Lead Channel Impedance Value: 399 Ohm
Lead Channel Impedance Value: 399 Ohm
Lead Channel Impedance Value: 475 Ohm
Lead Channel Impedance Value: 475 Ohm
Lead Channel Impedance Value: 513 Ohm
Lead Channel Impedance Value: 551 Ohm
Lead Channel Impedance Value: 551 Ohm
Lead Channel Impedance Value: 608 Ohm
Lead Channel Impedance Value: 608 Ohm
Lead Channel Impedance Value: 646 Ohm
Lead Channel Pacing Threshold Amplitude: 0.5 V
Lead Channel Pacing Threshold Amplitude: 0.625 V
Lead Channel Pacing Threshold Amplitude: 1.25 V
Lead Channel Pacing Threshold Pulse Width: 0.4 ms
Lead Channel Pacing Threshold Pulse Width: 0.4 ms
Lead Channel Pacing Threshold Pulse Width: 0.4 ms
Lead Channel Sensing Intrinsic Amplitude: 10.625 mV
Lead Channel Sensing Intrinsic Amplitude: 10.625 mV
Lead Channel Sensing Intrinsic Amplitude: 3.25 mV
Lead Channel Sensing Intrinsic Amplitude: 3.25 mV
Lead Channel Setting Pacing Amplitude: 1.75 V
Lead Channel Setting Pacing Amplitude: 2.25 V
Lead Channel Setting Pacing Amplitude: 2.5 V
Lead Channel Setting Pacing Pulse Width: 0.4 ms
Lead Channel Setting Pacing Pulse Width: 0.4 ms
Lead Channel Setting Sensing Sensitivity: 0.45 mV
Zone Setting Status: 755011
Zone Setting Status: 755011

## 2022-10-06 ENCOUNTER — Other Ambulatory Visit (HOSPITAL_COMMUNITY): Payer: Self-pay

## 2022-10-06 ENCOUNTER — Other Ambulatory Visit: Payer: Self-pay

## 2022-10-09 ENCOUNTER — Ambulatory Visit (HOSPITAL_COMMUNITY)
Admission: EM | Admit: 2022-10-09 | Discharge: 2022-10-09 | Disposition: A | Discharge status: Home / Self Care | Payer: 59 | Attending: Emergency Medicine | Admitting: Emergency Medicine

## 2022-10-09 ENCOUNTER — Encounter (HOSPITAL_COMMUNITY): Payer: Self-pay | Admitting: Emergency Medicine

## 2022-10-09 ENCOUNTER — Other Ambulatory Visit: Payer: Self-pay

## 2022-10-09 DIAGNOSIS — J069 Acute upper respiratory infection, unspecified: Secondary | ICD-10-CM | POA: Diagnosis present

## 2022-10-09 DIAGNOSIS — Z1152 Encounter for screening for COVID-19: Secondary | ICD-10-CM | POA: Diagnosis not present

## 2022-10-09 LAB — BASIC METABOLIC PANEL
Anion gap: 9 (ref 5–15)
BUN: 13 mg/dL (ref 8–23)
CO2: 26 mmol/L (ref 22–32)
Calcium: 9 mg/dL (ref 8.9–10.3)
Chloride: 105 mmol/L (ref 98–111)
Creatinine, Ser: 0.92 mg/dL (ref 0.44–1.00)
GFR, Estimated: >60 mL/min (ref >60)
Glucose, Bld: 84 mg/dL (ref 70–99)
Potassium: 3.8 mmol/L (ref 3.5–5.1)
Sodium: 140 mmol/L (ref 135–145)

## 2022-10-09 NOTE — ED Provider Notes (Signed)
[facility]    CSN: [id number] Arrival date & time: 10/09/22  0858      History   Chief Complaint Chief Complaint  Patient presents with   Sore Throat    HPI [name] is a 72 y.o. female.  Complains of chills, sore throat, cough, no fever (did not check temp), body aches.  Symptoms started 2 days ago.  Patient has not had any medications for her symptoms. Has not tested self at home for covid.    Sore Throat    Past Medical History:  Diagnosis Date   Anemia    a. Noted on 07/2012 labs, instructed to f/u PCP.   Arthritis    "joints" (11/18/2012)   CAD (coronary artery disease), native coronary artery    a. Nonobstructive by cath 02/2012 (done because of low EF).   Chronic bronchitis (HCC)    "~ every other year" (11/18/2012)   Chronic combined systolic and diastolic CHF (congestive heart failure) (HCC)    a. 03/05/12 echo:  LVEF 20-25%, moderate LVH , inferior and basal to mid septal akinesis, anterior moderate to severe hypokinesis and grade 2 diastolic dysfunction. b. EF 07/2012: EF still 25% (unclear medication compliance).   Chronic lower back pain    Headache(784.0)    "often; maybe not daily" (11/18/2012)   High cholesterol    History of noncompliance with medical treatment    Hypertension    LBBB (left bundle branch block)    Orthopnea    Tobacco abuse    Type II diabetes mellitus [facility])     Patient Active Problem List   Diagnosis Date Noted   Right foot infection 10/22/2017   Cellulitis in diabetic foot (HCC) 10/22/2017   Hyperglycemia 10/22/2017   Lumbar radiculopathy 09/18/2017   Degenerative spondylolisthesis 09/18/2017   Atrial fibrillation (HCC) 02/11/2017   Paroxysmal A-fib (HCC)    Biventricular automatic implantable cardioverter defibrillator in situ    Sepsis (HCC) 04/20/2016   UTI (urinary tract infection) 04/20/2016   Dyspnea 07/19/2015   Interstitial lung disease (HCC) 11/20/2014   SIRS (systemic inflammatory response  syndrome) (HCC) 02/03/2014   IDDM (insulin dependent diabetes mellitus) 02/03/2014   Tachycardia 06/14/2013   Chronic systolic CHF (congestive heart failure) (HCC) 09/29/2012   Hypoxia 03/06/2012   Hypertensive heart disease 03/06/2012   Nonischemic cardiomyopathy (HCC) 03/06/2012   Tobacco abuse 03/06/2012   Type 2 diabetes mellitus (HCC) 03/06/2012   Hypertension 03/06/2012   CAD (coronary artery disease), native coronary artery 03/06/2012   Hypokalemia 03/06/2012   Acute bronchitis 02/01/2009   Sleep apnea 02/01/2009   Chest pain 02/01/2009    Past Surgical History:  Procedure Laterality Date   BI-VENTRICULAR IMPLANTABLE CARDIOVERTER DEFIBRILLATOR N/A 11/18/2012   Procedure: BI-VENTRICULAR IMPLANTABLE CARDIOVERTER DEFIBRILLATOR  (CRT-D);  Surgeon: Marinus Maw, MD;  Location: [facility] CATH LAB;  Service: Cardiovascular;  Laterality: N/A;   BI-VENTRICULAR IMPLANTABLE CARDIOVERTER DEFIBRILLATOR  (CRT-D)  11/18/2012   BIV ICD GENERATOR CHANGEOUT N/A 11/22/2020   Procedure: BIV ICD GENERATOR CHANGEOUT;  Surgeon: Marinus Maw, MD;  Location: [facility] INVASIVE CV LAB;  Service: Cardiovascular;  Laterality: N/A;   CARDIAC CATHETERIZATION  03/04/12   nonobstructive CAD, elevated LVEDP and tortuous vessels suggestive of long-standing hypertension   COLONOSCOPY WITH PROPOFOL N/A 12/12/2016   Procedure: COLONOSCOPY WITH PROPOFOL;  Surgeon: Jeani Hawking, MD;  Location: [facility] ENDOSCOPY;  Service: Endoscopy;  Laterality: N/A;   JOINT REPLACEMENT     Bilateral hip and right knee   LEFT HEART CATH  N/A 03/05/2012   Procedure: LEFT HEART CATH;  Surgeon: Laurey Morale, MD;  Location: [facility] CATH LAB;  Service: Cardiovascular;  Laterality: N/A;    OB History   No obstetric history on file.      Home Medications    Prior to Admission medications   Medication Sig Start Date End Date Taking? Authorizing Provider  OZEMPIC, 0.25 OR 0.5 MG/DOSE, 2 MG/3ML SOPN SMARTSIG:0.25 Milligram(s) SUB-Q Once a Week  08/19/22  Yes [provider]  apixaban (ELIQUIS) 5 MG TABS tablet TAKE 1 TABLET(5 MG) BY MOUTH TWICE DAILY 08/31/20   Laurey Morale, MD  atorvastatin (LIPITOR) 40 MG tablet Take 1 tablet by mouth once daily 02/14/21   Laurey Morale, MD  benzonatate (TESSALON) 100 MG capsule Take 1 capsule (100 mg total) by mouth every 8 (eight) hours. Patient not taking: Reported on 10/09/2022 04/05/22   Raspet, Denny Peon K, PA-C  carvedilol (COREG) 25 MG tablet TAKE ONE TABLET BY MOUTH TWICE A DAY WITH MEALS 01/03/21   Laurey Morale, MD  dapagliflozin propanediol (FARXIGA) 10 MG TABS tablet Take 1 tablet (10 mg total) by mouth daily before breakfast. Patient not taking: Reported on 10/09/2022 04/02/22   Robbie Lis M, PA-C  gabapentin (NEURONTIN) 100 MG capsule Take 100 mg by mouth 3 (three) times daily. 03/10/18   [provider]  glucose blood (FREESTYLE TEST STRIPS) test strip Use as instructed 04/25/16   Ghimire, Werner Lean, MD  glucose monitoring kit (FREESTYLE) monitoring kit 1 each by Does not apply route 4 (four) times daily - after meals and at bedtime. 1 month Diabetic Testing Supplies for QAC-QHS accuchecks. 04/25/16   Ghimire, Werner Lean, MD  HUMALOG KWIKPEN 100 UNIT/ML KwikPen Sliding scale 02/12/18   [provider]  insulin glargine (LANTUS) 100 UNIT/ML Solostar Pen Inject 50 Units into the skin at bedtime.    [provider]  Insulin Pen Needle 32G X 8 MM MISC Use as directed 06/18/16   Little Ishikawa, NP  Lancets (FREESTYLE) lancets Use as instructed 06/18/16   Little Ishikawa, NP  metFORMIN (GLUMETZA) 1000 MG (MOD) 24 hr tablet Patient takes 2 tablets by mouth at bedtime.    [provider]  oxyCODONE (ROXICODONE) 5 MG immediate release tablet Take 1 tablet (5 mg total) by mouth every 4 (four) hours as needed for severe pain. 06/14/21   Smoot, Sarah A, PA-C  potassium chloride (KLOR-CON M) 10 MEQ tablet Take 10 mEq by mouth 2 (two) times daily.    [provider]  sacubitril-valsartan (ENTRESTO) 97-103 MG Take 1 tablet by mouth 2 (two) times daily. 04/25/22   Laurey Morale, MD  Tafamidis (VYNDAMAX) 61 MG CAPS TAKE 1 CAPSULE (61 MG) BY MOUTH DAILY. 05/09/22   Laurey Morale, MD  torsemide (DEMADEX) 20 MG tablet Patient takes 3 tablets by mouth at bedtime.    [provider]    Family History Family History  Problem Relation Age of Onset   Heart failure Mother    Heart disease Neg Hx     Social History Social History   Tobacco Use   Smoking status: Former    Current packs/day: 0.00    Average packs/day: 1 pack/day for 40.0 years (40.0 ttl pk-yrs)    Types: Cigarettes    Start date: 06/23/1972    Quit date: 03/26/2012    Years since quitting: 10.5   Smokeless tobacco: Never  Vaping Use   Vaping status: Never Used  Substance Use Topics   Alcohol use: No   Drug use: No     Allergies   Morphine and codeine, Penicillamine, and Penicillins   Review of Systems Review of Systems   Physical Exam Triage Vital Signs ED Triage Vitals [10/09/22 0953]  Encounter Vitals Group     BP (!) 143/79     Systolic BP Percentile      Diastolic BP Percentile      Pulse Rate 92     Resp 20     Temp 98.5 F (36.9 C)     Temp Source Oral     SpO2 96 %     Weight      Height      Head Circumference      Peak Flow      Pain Score      Pain Loc      Pain Education      Exclude from Growth Chart    No data found.  Updated Vital Signs BP (!) 143/79 (BP Location: Right Arm)   Pulse 92   Temp 98.5 F (36.9 C) (Oral)   Resp 20   SpO2 96%   Visual Acuity Right Eye Distance:   Left Eye Distance:   Bilateral Distance:    Right Eye Near:   Left Eye Near:    Bilateral Near:     Physical Exam Constitutional:      Appearance: She is well-developed. She is ill-appearing. She is not toxic-appearing.  HENT:     Right Ear: Tympanic membrane, ear canal and external ear normal.     Left Ear: Tympanic membrane, ear  canal and external ear normal.     Nose: Congestion present.     Mouth/Throat:     Mouth: Mucous membranes are moist.     Pharynx: Oropharynx is clear. No oropharyngeal exudate or posterior oropharyngeal erythema.  Cardiovascular:     Rate and Rhythm: Normal rate and regular rhythm.  Pulmonary:     Effort: Pulmonary effort is normal.     Breath sounds: Normal breath sounds.  Neurological:     Mental Status: She is alert.      UC Treatments / Results  Labs (all labs ordered are listed, but only abnormal results are displayed) Labs Reviewed  SARS CORONAVIRUS 2 (TAT 6-24 HRS)  BASIC METABOLIC PANEL    EKG   Radiology No results found.  Procedures Procedures (including critical care time)  Medications Ordered in UC Medications - No data to display  Initial Impression / Assessment and Plan / UC Course  I have reviewed the triage vital signs and the nursing notes.  Pertinent labs & imaging results that were available during my care of the patient were reviewed by me and considered in my medical decision making (see chart for details).    Appears to have viral illness. Covid test results pending. Discussed supportive care measures. Because COVID prevalence is high in the community right now, I elected to check her kidney function in case she tests positive for covid as she should have paxlovid.   Final Clinical Impressions(s) / UC Diagnoses   Final diagnoses:  Upper respiratory tract infection, unspecified type     Discharge Instructions      Try Mucinex (it is okay to use the generic version guaifenesin) to help with your congestion.  I think this will also help your sore throat also.   Use saline nasal spray several times a day.  For your sore throat  pain, try Tylenol (per package directions), throat lozenges, throat sprays, and/or gargling with salt water.  We will call you if your COVID test is positive and we will get you what you need; we do not call if it  is negative.   ED Prescriptions   None    PDMP not reviewed this encounter.   Cathlyn Parsons, NP 10/09/22 1108

## 2022-10-09 NOTE — Discharge Instructions (Signed)
Try Mucinex (it is okay to use the generic version guaifenesin) to help with your congestion.  I think this will also help your sore throat also.   Use saline nasal spray several times a day.  For your sore throat pain, try Tylenol (per package directions), throat lozenges, throat sprays, and/or gargling with salt water.  We will call you if your COVID test is positive and we will get you what you need; we do not call if it is negative.

## 2022-10-09 NOTE — ED Triage Notes (Signed)
Symptoms started 2 days ago.  Complains of chills, sore throat, cough, unknown fever.  Denies vomiting  Patient has not had any medications

## 2022-10-10 ENCOUNTER — Other Ambulatory Visit (HOSPITAL_COMMUNITY): Payer: Self-pay

## 2022-10-10 LAB — SARS CORONAVIRUS 2 (TAT 6-24 HRS): SARS Coronavirus 2: NEGATIVE

## 2022-10-14 NOTE — Progress Notes (Signed)
Remote ICD transmission.   

## 2022-10-18 ENCOUNTER — Encounter (HOSPITAL_COMMUNITY): Payer: Self-pay

## 2022-10-22 ENCOUNTER — Telehealth: Payer: Self-pay | Admitting: *Deleted

## 2022-10-22 NOTE — Telephone Encounter (Signed)
Clearance faxed to requesting office

## 2022-10-22 NOTE — Telephone Encounter (Signed)
Patient with diagnosis of afib on Eliquis for anticoagulation.    Procedure: left TKA Date of procedure: TBD  CHA2DS2-VASc Score = 6  This indicates a 9.7% annual risk of stroke. The patient's score is based upon: CHF History: 1 HTN History: 1 Diabetes History: 1 Stroke History: 0 Vascular Disease History: 1 Age Score: 1 Gender Score: 1   CrCl 10mL/min using adjusted body weight Platelet count 326K  Per office protocol, patient can hold Eliquis for 3 days prior to procedure.    **This guidance is not considered finalized until pre-operative APP has relayed final recommendations.**

## 2022-10-22 NOTE — Telephone Encounter (Signed)
Preop team, Dr. Ladona Ridgel provided recommendations for her upcoming knee surgery during her last OV. Please forward that note to the requesting office. Thank you for your help.   Thomasene Ripple. Aubriegh Minch NP-C     10/22/2022, 3:07 PM [facility] [address] Office [phone] Fax [phone]

## 2022-10-22 NOTE — Telephone Encounter (Signed)
Pre-operative Risk Assessment    Patient Name: [name]  DOB: 05/25/1950 MRN: [id number]   DATE OF LAST VISIT:  08/14/22 DR. Ladona Ridgel DATE OF NEXT VISIT: NONE  Request for Surgical Clearance    Procedure:   LEFT TOTAL KNEE ARTHROPLASTY  Date of Surgery:  Clearance TBD                                 Surgeon:  DR. Gean Birchwood Surgeon's Group or Practice Name:  [facility] Phone number:  [phone] ATTN: REBECCA LANG Fax number:  [phone]   Type of Clearance Requested:   - Medical  - Pharmacy:  Hold Apixaban (Eliquis)     Type of Anesthesia:  Spinal   Additional requests/questions:    [name]   10/22/2022, 2:25 PM

## 2022-10-27 ENCOUNTER — Other Ambulatory Visit: Payer: Self-pay

## 2022-10-27 ENCOUNTER — Encounter (HOSPITAL_COMMUNITY): Payer: Self-pay

## 2022-10-27 ENCOUNTER — Emergency Department (HOSPITAL_COMMUNITY)
Admission: EM | Admit: 2022-10-27 | Discharge: 2022-10-27 | Disposition: A | Discharge status: Home / Self Care | Payer: 59 | Attending: Emergency Medicine | Admitting: Emergency Medicine

## 2022-10-27 DIAGNOSIS — I480 Paroxysmal atrial fibrillation: Secondary | ICD-10-CM | POA: Insufficient documentation

## 2022-10-27 DIAGNOSIS — Z79899 Other long term (current) drug therapy: Secondary | ICD-10-CM | POA: Diagnosis not present

## 2022-10-27 DIAGNOSIS — R531 Weakness: Secondary | ICD-10-CM | POA: Diagnosis present

## 2022-10-27 DIAGNOSIS — R42 Dizziness and giddiness: Secondary | ICD-10-CM | POA: Insufficient documentation

## 2022-10-27 DIAGNOSIS — R944 Abnormal results of kidney function studies: Secondary | ICD-10-CM | POA: Insufficient documentation

## 2022-10-27 DIAGNOSIS — Z7984 Long term (current) use of oral hypoglycemic drugs: Secondary | ICD-10-CM | POA: Diagnosis not present

## 2022-10-27 DIAGNOSIS — N179 Acute kidney failure, unspecified: Secondary | ICD-10-CM

## 2022-10-27 DIAGNOSIS — E1165 Type 2 diabetes mellitus with hyperglycemia: Secondary | ICD-10-CM | POA: Diagnosis not present

## 2022-10-27 DIAGNOSIS — Z950 Presence of cardiac pacemaker: Secondary | ICD-10-CM | POA: Insufficient documentation

## 2022-10-27 DIAGNOSIS — Z7901 Long term (current) use of anticoagulants: Secondary | ICD-10-CM | POA: Diagnosis not present

## 2022-10-27 DIAGNOSIS — Z794 Long term (current) use of insulin: Secondary | ICD-10-CM | POA: Insufficient documentation

## 2022-10-27 DIAGNOSIS — I509 Heart failure, unspecified: Secondary | ICD-10-CM | POA: Insufficient documentation

## 2022-10-27 DIAGNOSIS — I2510 Atherosclerotic heart disease of native coronary artery without angina pectoris: Secondary | ICD-10-CM | POA: Insufficient documentation

## 2022-10-27 DIAGNOSIS — I110 Hypertensive heart disease with heart failure: Secondary | ICD-10-CM | POA: Diagnosis not present

## 2022-10-27 LAB — URINALYSIS, ROUTINE W REFLEX MICROSCOPIC
Bilirubin Urine: NEGATIVE
Glucose, UA: NEGATIVE mg/dL
Hgb urine dipstick: NEGATIVE
Ketones, ur: NEGATIVE mg/dL
Leukocytes,Ua: NEGATIVE
Nitrite: NEGATIVE
Protein, ur: NEGATIVE mg/dL
Specific Gravity, Urine: 1.012 (ref 1.005–1.030)
pH: 5 (ref 5.0–8.0)

## 2022-10-27 LAB — CBC WITH DIFFERENTIAL/PLATELET
Abs Immature Granulocytes: 0.02 10*3/uL (ref 0.00–0.07)
Basophils Absolute: 0.1 10*3/uL (ref 0.0–0.1)
Basophils Relative: 1 %
Eosinophils Absolute: 0.2 10*3/uL (ref 0.0–0.5)
Eosinophils Relative: 2 %
HCT: 34.2 % — ABNORMAL LOW (ref 36.0–46.0)
Hemoglobin: 11.4 g/dL — ABNORMAL LOW (ref 12.0–15.0)
Immature Granulocytes: 0 %
Lymphocytes Relative: 22 %
Lymphs Abs: 1.8 10*3/uL (ref 0.7–4.0)
MCH: 29.9 pg (ref 26.0–34.0)
MCHC: 33.3 g/dL (ref 30.0–36.0)
MCV: 89.8 fL (ref 80.0–100.0)
Monocytes Absolute: 0.6 10*3/uL (ref 0.1–1.0)
Monocytes Relative: 8 %
Neutro Abs: 5.4 10*3/uL (ref 1.7–7.7)
Neutrophils Relative %: 67 %
Platelets: 364 10*3/uL (ref 150–400)
RBC: 3.81 MIL/uL — ABNORMAL LOW (ref 3.87–5.11)
RDW: 12.6 % (ref 11.5–15.5)
WBC: 8 10*3/uL (ref 4.0–10.5)
nRBC: 0 % (ref 0.0–0.2)

## 2022-10-27 LAB — COMPREHENSIVE METABOLIC PANEL
ALT: 8 U/L (ref 0–44)
AST: 20 U/L (ref 15–41)
Albumin: 4 g/dL (ref 3.5–5.0)
Alkaline Phosphatase: 64 U/L (ref 38–126)
Anion gap: 13 (ref 5–15)
BUN: 29 mg/dL — ABNORMAL HIGH (ref 8–23)
CO2: 24 mmol/L (ref 22–32)
Calcium: 9.1 mg/dL (ref 8.9–10.3)
Chloride: 102 mmol/L (ref 98–111)
Creatinine, Ser: 2.01 mg/dL — ABNORMAL HIGH (ref 0.44–1.00)
GFR, Estimated: 26 mL/min — ABNORMAL LOW (ref >60)
Glucose, Bld: 152 mg/dL — ABNORMAL HIGH (ref 70–99)
Potassium: 3.4 mmol/L — ABNORMAL LOW (ref 3.5–5.1)
Sodium: 139 mmol/L (ref 135–145)
Total Bilirubin: 0.5 mg/dL (ref 0.3–1.2)
Total Protein: 7.3 g/dL (ref 6.5–8.1)

## 2022-10-27 LAB — MAGNESIUM: Magnesium: 1.8 mg/dL (ref 1.7–2.4)

## 2022-10-27 LAB — CBG MONITORING, ED: Glucose-Capillary: 246 mg/dL — ABNORMAL HIGH (ref 70–99)

## 2022-10-27 LAB — TSH: TSH: 0.766 u[IU]/mL (ref 0.350–4.500)

## 2022-10-27 MED ORDER — SODIUM CHLORIDE 0.9 % IV BOLUS
500.0000 mL | Freq: Once | INTRAVENOUS | Status: AC
  Administered 2022-10-27: 500 mL via INTRAVENOUS

## 2022-10-27 NOTE — Discharge Instructions (Addendum)
Thank you for letting us take care of you today.  Your urine was negative for acute infection.  You have no increased white blood cells fighting off an infection in your body.  No acute blood loss.  Your glucose was elevated with some glucose in your urine.  Have an elevated creatinine level which indicates an acute kidney injury.  We provided some fluids for possible dehydration.  Please continue appropriate fluid resuscitation.  Follow-up with your primary care provider regarding elevated creatinine and elevated glucose  Return to emergency department if you experience syncope, altered mental status, dizziness, blurred vision

## 2022-10-27 NOTE — ED Triage Notes (Signed)
Pt was at [facility] and started to feel weak with tremors. Called EMS thinking that blood sugar was low. EMS took CBG of 250. Has a demand ventricular pacemaker and EMS states that it was firing while on scene.   EMS Vital Signs 80/68 100% RA 110 HR

## 2022-10-27 NOTE — ED Provider Notes (Signed)
[place] EMERGENCY DEPARTMENT AT [facility] Provider Note   CSN: [id number] Arrival date & time: 10/27/22  1026     History  Chief Complaint  Patient presents with   Weakness   Tremors    [name] is a 72 y.o. female with past medical history of CAD, cardiomyopathy, CHF, ICD, Medtronic pacemaker, diabetes (insulin-dependent), HTN, paroxysmal A-fib (on Eliquis) presents to the emergency department via EMS for evaluation of lightheadedness and feeling faint while getting her oil changed at [facility] today. She reports that she stood up suddenly from sitting and started feeling dizzy. She denies LOC and was able to get to ground without injury.  She denies recent infection, cough, shortness of breath, chest pain, fevers, ICD defibrillation.   Weakness Associated symptoms: no chest pain, no cough, no dizziness, no dysuria, no fever, no headaches, no seizures, no shortness of breath and no urgency       Home Medications Prior to Admission medications   Medication Sig Start Date End Date Taking? Authorizing Provider  apixaban (ELIQUIS) 5 MG TABS tablet TAKE 1 TABLET(5 MG) BY MOUTH TWICE DAILY 08/31/20   Laurey Morale, MD  atorvastatin (LIPITOR) 40 MG tablet Take 1 tablet by mouth once daily 02/14/21   Laurey Morale, MD  benzonatate (TESSALON) 100 MG capsule Take 1 capsule (100 mg total) by mouth every 8 (eight) hours. Patient not taking: Reported on 10/09/2022 04/05/22   Raspet, Denny Peon K, PA-C  carvedilol (COREG) 25 MG tablet TAKE ONE TABLET BY MOUTH TWICE A DAY WITH MEALS 01/03/21   Laurey Morale, MD  dapagliflozin propanediol (FARXIGA) 10 MG TABS tablet Take 1 tablet (10 mg total) by mouth daily before breakfast. Patient not taking: Reported on 10/09/2022 04/02/22   Robbie Lis M, PA-C  gabapentin (NEURONTIN) 100 MG capsule Take 100 mg by mouth 3 (three) times daily. 03/10/18   [provider]  glucose blood (FREESTYLE TEST STRIPS) test strip Use as  instructed 04/25/16   Ghimire, Werner Lean, MD  glucose monitoring kit (FREESTYLE) monitoring kit 1 each by Does not apply route 4 (four) times daily - after meals and at bedtime. 1 month Diabetic Testing Supplies for QAC-QHS accuchecks. 04/25/16   Ghimire, Werner Lean, MD  HUMALOG KWIKPEN 100 UNIT/ML KwikPen Sliding scale 02/12/18   [provider]  insulin glargine (LANTUS) 100 UNIT/ML Solostar Pen Inject 50 Units into the skin at bedtime.    [provider]  Insulin Pen Needle 32G X 8 MM MISC Use as directed 06/18/16   Little Ishikawa, NP  Lancets (FREESTYLE) lancets Use as instructed 06/18/16   Little Ishikawa, NP  metFORMIN (GLUMETZA) 1000 MG (MOD) 24 hr tablet Patient takes 2 tablets by mouth at bedtime.    [provider]  oxyCODONE (ROXICODONE) 5 MG immediate release tablet Take 1 tablet (5 mg total) by mouth every 4 (four) hours as needed for severe pain. 06/14/21   Smoot, Sarah A, PA-C  OZEMPIC, 0.25 OR 0.5 MG/DOSE, 2 MG/3ML SOPN SMARTSIG:0.25 Milligram(s) SUB-Q Once a Week 08/19/22   [provider]  potassium chloride (KLOR-CON M) 10 MEQ tablet Take 10 mEq by mouth 2 (two) times daily.    [provider]  sacubitril-valsartan (ENTRESTO) 97-103 MG Take 1 tablet by mouth 2 (two) times daily. 04/25/22   Laurey Morale, MD  Tafamidis (VYNDAMAX) 61 MG CAPS TAKE 1 CAPSULE (61 MG) BY MOUTH DAILY. 05/09/22   Laurey Morale, MD  torsemide [facility]) 20  MG tablet Patient takes 3 tablets by mouth at bedtime.    [provider]      Allergies    Morphine and codeine, Penicillamine, and Penicillins    Review of Systems   Review of Systems  Constitutional:  Negative for fever.  Respiratory:  Negative for cough, choking and shortness of breath.   Cardiovascular:  Negative for chest pain and leg swelling.  Genitourinary:  Negative for decreased urine volume, difficulty urinating, dysuria, flank pain, hematuria, urgency, vaginal bleeding and vaginal  discharge.  Neurological:  Positive for weakness and light-headedness. Negative for dizziness, seizures, syncope, numbness and headaches.    Physical Exam Updated Vital Signs BP 110/71   Pulse 93   Temp (!) 97.5 F (36.4 C) (Oral)   Resp 14   Ht 5\' 7"  (1.702 m)   Wt 91.2 kg   SpO2 99%   BMI 31.48 kg/m  Physical Exam Vitals and nursing note reviewed.  Constitutional:      General: She is not in acute distress.    Appearance: Normal appearance.  HENT:     Head: Normocephalic and atraumatic.     Nose: Nose normal. No congestion.     Mouth/Throat:     Mouth: Mucous membranes are moist.  Eyes:     Extraocular Movements: Extraocular movements intact.     Conjunctiva/sclera: Conjunctivae normal.     Pupils: Pupils are equal, round, and reactive to light.  Cardiovascular:     Rate and Rhythm: Normal rate.     Pulses: Normal pulses.  Pulmonary:     Effort: Pulmonary effort is normal. No respiratory distress.     Breath sounds: Normal breath sounds. No wheezing or rhonchi.  Chest:     Chest wall: No tenderness.  Abdominal:     General: There is no distension.     Palpations: Abdomen is soft. There is no mass.     Tenderness: There is no abdominal tenderness. There is no right CVA tenderness, left CVA tenderness, guarding or rebound.  Musculoskeletal:        General: No swelling, tenderness or deformity. Normal range of motion.     Cervical back: Normal range of motion and neck supple. No rigidity or tenderness.     Right lower leg: No edema.     Left lower leg: No edema.  Lymphadenopathy:     Cervical: No cervical adenopathy.  Skin:    Coloration: Skin is not jaundiced or pale.  Neurological:     Mental Status: She is alert. Mental status is at baseline.     Cranial Nerves: No cranial nerve deficit.     Sensory: No sensory deficit.     Motor: No weakness.     Coordination: Coordination normal.     Gait: Gait normal.     Comments: Equal strength and sensation to UEs and  LEs No paresthesia to UEs or LEs No blurry vision, visual disturbance VA intact    ED Results / Procedures / Treatments   Labs (all labs ordered are listed, but only abnormal results are displayed) Labs Reviewed  CBC WITH DIFFERENTIAL/PLATELET - Abnormal; Notable for the following components:      Result Value   RBC 3.81 (*)    Hemoglobin 11.4 (*)    HCT 34.2 (*)    All other components within normal limits  CBG MONITORING, ED - Abnormal; Notable for the following components:   Glucose-Capillary 246 (*)    All other components within normal limits  COMPREHENSIVE METABOLIC PANEL  URINALYSIS, ROUTINE W REFLEX MICROSCOPIC  MAGNESIUM  TSH    EKG EKG Interpretation Date/Time:  Monday October 27 2022 10:38:02 EDT Ventricular Rate:  98 PR Interval:  175 QRS Duration:  133 QT Interval:  411 QTC Calculation: 525 R Axis:   40  Text Interpretation: Sinus rhythm IVCD, consider atypical LBBB when compared to prior, similar long QTC but does nor appears paced at this time. No STEMI Confirmed by Theda Belfast ([id number]) on 10/27/2022 10:44:24 AM  Radiology No results found.  Procedures Procedures    Medications Ordered in ED Medications  sodium chloride 0.9 % bolus 500 mL (500 mLs Intravenous New Bag/Given 10/27/22 1230)    ED Course/ Medical Decision Making/ A&P                                 Medical Decision Making   Patient presents to the ED for concern of presyncope that occurred today, this involves an extensive number of treatment options, and is a complaint that carries with it a high risk of complications and morbidity.  The differential diagnosis includes electrolyte abnormality, hemodynamic instability, orthostatic hypotension, vasovagal syndrome, sepsis, symptomatic anemia, dehydration   Co morbidities that complicate the patient evaluation  Diabetes CHF ICD   Additional history obtained:  Additional history obtained from  Nursing and Outside Medical  Records    Lab Tests:  I Ordered, and personally interpreted labs.  The pertinent results include: Hyperglycemia at 246   Cardiac Monitoring:  The patient was maintained on a cardiac monitor.  I personally viewed and interpreted the cardiac monitored which showed an underlying rhythm of: Sinus rhythm   Medicines ordered and prescription drug management:  I ordered medication including NS  for possible dehydration, AKI  Reevaluation of the patient after these medicines showed that the patient improved I have reviewed the patients home medicines and have made adjustments as needed   Test Considered:  CT head   Consultations Obtained:  Dr. Rush Landmark requested consultation with cardiology,  and discussed lab and imaging findings as well as pertinent plan - they recommend: Outpatient cardiology follow-up   Problem List / ED Course:  Weakness Dizzy/lightheadedness   Reevaluation:  After the interventions noted above, I reevaluated the patient and found that they have :improved    Dispostion:  Upon evaluation of the patient, patient is resting comfortably in bed.  She has no complaints of chest pain, shortness of breath, dizziness, lightheadedness.  See HPI  Upon pacemaker review, on August 8 she had 1 episode of nonsustained VT. She was in atrial arrhythmia for 29 hours starting on September 10, 98% paced ventricles 0.2% atrial paced. Good leads, capture, threshold are reported.    Lab work significant for elevated creatinine from normal (2.01) and mild hyperglycemia (152).  UA negative for acute infection, no leukocytosis.  TSH and Mg WNL.  In ED, patient has no complaints. EKG shows NSR with no STE or ischemia noted. Vital signs WNL and she is hemodynamically stable.  Upon exam, she is not fluid overloaded so will give NS bolus due to elevated creatinine and possible dehydration.  1:10 PM Cardiology consulted and will assess patient.  They do not feel the  patient needs to be admitted from a cardiac stand point and his pacemaker is working properly. Patient is asymptomatic, has not had chest pain or shortness of breath. They advise patient to follow up  with cardiology outpatient for ICD and pacemaker management.  After consideration of the diagnostic results and the patients response to treatment, I feel that the patent would benefit from outpatient cardiology and PCP follow up.  Patient is able to ambulate from bed without difficulty and passed p.o. challenge.  Explained physical exam findings, lab work, cardiology consult, and treatment plan with patient who expresses understanding and agrees with plan.  Discussed importance of maintaining adequate hydration, strict glycemic control, and routine outpatient PCP follow-up.  I provided return to emergency department precautions to include but not limited to syncope, AMS, dizziness, blurry vision, intractable vomiting.    D.r Tegeler individually assessed patient, consulted cardiology, agrees with treatment plan.        Final Clinical Impression(s) / ED Diagnoses Final diagnoses:  None    Rx / DC Orders ED Discharge Orders     None         Judithann Sheen, PA 10/27/22 1554    Tegeler, Canary Brim, MD 10/28/22 [phone]

## 2022-10-30 ENCOUNTER — Other Ambulatory Visit (HOSPITAL_COMMUNITY): Payer: Self-pay

## 2022-10-30 NOTE — Progress Notes (Signed)
Patient has a month of Vyndamax on hand and requested a call back in 3 weeks, re-timed specialty pharmacy outreach.

## 2022-11-17 ENCOUNTER — Other Ambulatory Visit (HOSPITAL_COMMUNITY): Payer: Self-pay

## 2022-11-17 ENCOUNTER — Encounter (HOSPITAL_COMMUNITY): Payer: Self-pay

## 2022-11-17 NOTE — Progress Notes (Signed)
Specialty Pharmacy Ongoing Clinical Assessment Note  [name] is a 72 y.o. female who is being followed by the specialty pharmacy service for RxSp Cardiology   Patient's specialty medication(s) reviewed today: Tafamidis   Missed doses in the last 4 weeks: 0   Patient/Caregiver did not have any additional questions or concerns.   No data recorded  Adverse events/side effects summary: No adverse events/side effects   Patient's therapy is appropriate to: Continue    Goals Addressed             This Visit's Progress    Avoid hospitalizations and surgery       Patient is on track. Patient will maintain adherence.  Goal is to decrease cardiac related admissions, pt did have an ED visit related to weakness on 10/27/22, but reports that she is doing well at this time.           Follow up:  6 months  Servando Snare Specialty Pharmacist

## 2022-11-17 NOTE — Progress Notes (Signed)
Specialty Pharmacy Refill Coordination Note  [name] is a 72 y.o. female contacted today regarding refills of specialty medication(s) Tafamidis   Patient requested Delivery   Delivery date: 11/21/22   Verified address: [address] [place] [address]   Medication will be filled on 11/20/22.

## 2022-11-25 ENCOUNTER — Other Ambulatory Visit: Payer: Self-pay | Admitting: Orthopedic Surgery

## 2022-12-09 ENCOUNTER — Other Ambulatory Visit: Payer: Self-pay

## 2022-12-09 ENCOUNTER — Encounter: Payer: Self-pay | Admitting: Internal Medicine

## 2022-12-09 NOTE — Progress Notes (Signed)
COVID Vaccine received:  []  No [x]  Yes Date of any COVID positive Test in last 90 days:  PCP - Corliss Blacker, MD at [facility] [phone] (Work) [phone] (Fax)  Cardiologist -  Marca Ancona, MD EP-  Lewayne Bunting, MD Pain Mgmt: Ronney Lion, NP at [facility] [phone]  Chest x-ray - 04-06-2022  2v Epic EKG - 10-28-2022  Epic  Stress Test -  ECHO -  Cardiac Cath - 03-05-2012  PCR screen: [x]  Ordered & Completed []   No Order but Needs PROFEND     []   N/A for this surgery  Surgery Plan:  []  Ambulatory   [x]  Outpatient in bed  []  Admit Anesthesia:    []  General  [x]  Spinal  []   Choice []   MAC  ICD device []  No [x]  Yes  Last checked: 10-01-22   Device orders requested:  MERM [id number] VIVA QUAD XT CRT-D, ICD CLARIA MRI    Spinal Cord Stimulator:[x]  No []  Yes       History of Sleep Apnea? []  No [x]  Yes   CPAP used?- []  No []  Yes    Does the patient monitor blood sugar?   []  N/A   []  No []  Yes  Patient has: []  NO Hx DM   []  Pre-DM   []  DM1  [x]   DM2 Last A1c was: 8.3  on  04-02-2022  Epic     Does patient have a [employer] or Dexacom? []  No []  Yes   Fasting Blood Sugar Ranges-  Checks Blood Sugar _____ times a day  GLP1 agonist / usual dose - Ozempic  on Wednesday GLP1 instructions:   Last dose 12-10-2022  Insulin Glargine ( Lantus)  50 units q hs    Night before: 50% or 25 units.   DOS: 50 % dose  25 units  Humalog (Kwikpen) 20 units bid  SS   Day Before: Take as usual SS,   DOS: >220mg /dL take 1/2 usual correction dose.  Blood Thinner / Instructions:Eliquis:  hold x 3 days per Dr. Ladona Ridgel 08-14-22 note;  Aspirin Instructions:  none  ERAS Protocol Ordered: []  No  [x]  Yes PRE-SURGERY []  ENSURE  [x]  G2 Patient is to be NPO after: 06:30  Dental hx: []  Dentures:  []  N/A      []  Bridge or Partial:                   []  Loose or Damaged teeth:   Comments: Patient was given the 5 CHG shower / bath instructions for TKA surgery along with 2 bottles of the CHG soap.  Patient will start this on: Thursday  12-18-2022  All questions were asked and answered, Patient voiced understanding of this process.   Activity level: Patient is able / unable to climb a flight of stairs without difficulty; []  No CP  []  No SOB, but would have ___   Patient can / can not perform ADLs without assistance.   Anesthesia review: DM2, Has CRT-D (requested device order), CAD, HTN, CHF, A. Fib, OSA-    ,  ILD, CKD3, anemia, LBBB, long term opiate use,   Patient denies shortness of breath, fever, cough and chest pain at PAT appointment.  Patient verbalized understanding and agreement to the Pre-Surgical Instructions that were given to them at this PAT appointment. Patient was also educated of the need to review these PAT instructions again prior to her surgery.I reviewed the appropriate phone numbers to call if they have any and questions or  concerns.

## 2022-12-09 NOTE — Progress Notes (Signed)
Specialty Pharmacy Refill Coordination Note  [name] is a 71 y.o. female contacted today regarding refills of specialty medication(s) Tafamidis   Patient requested Delivery   Delivery date: 12/16/22   Verified address: [address] [place] [address]   Medication will be filled on 12/15/22.

## 2022-12-09 NOTE — Progress Notes (Signed)
PERIOPERATIVE PRESCRIPTION FOR IMPLANTED CARDIAC DEVICE PROGRAMMING  Patient Information: Name:  [name]  DOB:  12-Oct-1950  MRN:  [id number]  Planned Procedure:  Left Total knee arthroplasty  Surgeon: Gean Birchwood, MD  Date of Procedure 12-22-2022  Cautery will be used.  Position during surgery: Supine   Device Information:  Clinic EP Physician:  Lewayne Bunting, MD   Device Type:  Defibrillator Manufacturer and Phone #:  Medtronic: [id number] Pacemaker Dependent?:  No. Date of Last Device Check:  10/27/2022 Normal Device Function?:  Yes.    Electrophysiologist's Recommendations:  Have magnet available. Provide continuous ECG monitoring when magnet is used or reprogramming is to be performed.  Procedure should not interfere with device function.  No device programming or magnet placement needed.  Per Device Clinic Standing Orders, Wiliam Ke, RN  3:25 PM 12/09/2022

## 2022-12-09 NOTE — Patient Instructions (Addendum)
SURGICAL WAITING ROOM VISITATION Patients having surgery or a procedure may have no more than 2 support people in the waiting area - these visitors may rotate in the visitor waiting room.   Due to an increase in RSV and influenza rates and associated hospitalizations, children ages 98 and under may not visit patients in [facility] hospitals. If the patient needs to stay at the hospital during part of their recovery, the visitor guidelines for inpatient rooms apply.  PRE-OP VISITATION  Pre-op nurse will coordinate an appropriate time for 1 support person to accompany the patient in pre-op.  This support person may not rotate.  This visitor will be contacted when the time is appropriate for the visitor to come back in the pre-op area.  Please refer to the [facility] website for the visitor guidelines for Inpatients (after your surgery is over and you are in a regular room).  You are not required to quarantine at this time prior to your surgery. However, you must do this: Hand Hygiene often Do NOT share personal items Notify your provider if you are in close contact with someone who has COVID or you develop fever 100.4 or greater, new onset of sneezing, cough, sore throat, shortness of breath or body aches.  If you test positive for Covid or have been in contact with anyone that has tested positive in the last 10 days please notify you surgeon.    Your procedure is scheduled on:  Monday  December 22, 2022  Report to [facility] Main Entrance: [place] entrance where the [employer] is available.   Report to admitting at:  07:00    AM  Call this number if you have any questions or problems the morning of surgery [phone]  Do not eat food after Midnight the night prior to your surgery/procedure.  After Midnight you may have the following liquids until 06:30 AM  DAY OF SURGERY  Clear Liquid Diet Water Black Coffee (sugar ok, NO MILK/CREAM OR CREAMERS)  Tea (sugar ok, NO  MILK/CREAM OR CREAMERS) regular and decaf                             Plain Jell-O  with no fruit (NO RED)                                           Fruit ices (not with fruit pulp, NO RED)                                     Popsicles (NO RED)                                                                  Juice: NO CITRUS JUICES: only apple, WHITE grape, WHITE cranberry Sports drinks like Gatorade or Powerade (NO RED)                     The day of surgery:  Drink ONE (1) Pre-Surgery G2 at  06:30 AM the morning of  surgery. Drink in one sitting. Do not sip.  This drink was given to you during your hospital pre-op appointment visit. Nothing else to drink after completing the Pre-Surgery  G2 : No candy, chewing gum or throat lozenges.    FOLLOW ANY ADDITIONAL PRE OP INSTRUCTIONS YOU RECEIVED FROM YOUR SURGEON'S OFFICE!!!   Oral Hygiene is also important to reduce your risk of infection.        Remember - BRUSH YOUR TEETH THE MORNING OF SURGERY WITH YOUR REGULAR TOOTHPASTE  Do NOT smoke after Midnight the night before surgery.   How to Manage Your Diabetes Before and After Surgery  Why is it important to control my blood sugar before and after surgery? Improving blood sugar levels before and after surgery helps healing and can limit problems. A way of improving blood sugar control is eating a healthy diet by:  Eating less sugar and carbohydrates  Increasing activity/exercise  Talking with your doctor about reaching your blood sugar goals High blood sugars (greater than 180 mg/dL) can raise your risk of infections and slow your recovery, so you will need to focus on controlling your diabetes during the weeks before surgery. Make sure that the doctor who takes care of your diabetes knows about your planned surgery including the date and location.  How do I manage my blood sugar before surgery? Check your blood sugar at least 4 times a day, starting 2 days before surgery, to make  sure that the level is not too high or low. Check your blood sugar the morning of your surgery when you wake up and every 2 hours until you get to the Short Stay unit. If your blood sugar is less than 70 mg/dL, you will need to treat for low blood sugar: Do not take insulin. Treat a low blood sugar (less than 70 mg/dL) with  cup of clear juice (cranberry or apple), 4 glucose tablets, OR glucose gel. Recheck blood sugar in 15 minutes after treatment (to make sure it is greater than 70 mg/dL). If your blood sugar is not greater than 70 mg/dL on recheck, call [phone] for further instructions. Report your blood sugar to the short stay nurse when you get to Short Stay.  If you are admitted to the hospital after surgery: Your blood sugar will be checked by the staff and you will probably be given insulin after surgery (instead of oral diabetes medicines) to make sure you have good blood sugar levels. The goal for blood sugar control after surgery is 80-180 mg/dL.   WHAT DO I DO ABOUT MY DIABETES MEDICATION?    IF you have any questions, call the nurse at [phone]   Ozempic  on Wednesday   Last dose 12-10-2022   Insulin Glargine ( Lantus)  50 units at night   Night before: 50% or 25 units.   Day of surgery: 50 % dose  25 units   Humalog (Kwikpen) 20 units bid  SS   Day Before: Take as usual SS,   DOS: >220mg /dL take 1/2 usual correction dose.    Blood Thinner / Instructions: Eliquis:  hold x 3 days per Dr. Ladona Ridgel 08-14-22 note;   last dose will be taken on Thursday 12-18-2022   STOP TAKING all Vitamins, Herbs and supplements 1 week before your surgery.   Take ONLY these medicines the morning of surgery with A SIP OF WATER: Isosorbide- Hydralazine (Bidil), gabapentin, Tafamidis. You may take oxycodone if needed for severe pain.   If You have been  diagnosed with Sleep Apnea - Bring CPAP mask and tubing day of surgery. We will provide you with a CPAP machine on the day of your  surgery.                   You may not have any metal on your body including hair pins, jewelry, and body piercing  Do not wear make-up, lotions, powders, perfumes or deodorant  Do not wear nail polish including gel and S&S, artificial / acrylic nails, or any other type of covering on natural nails including finger and toenails. If you have artificial nails, gel coating, etc., that needs to be removed by a nail salon, Please have this removed prior to surgery. Not doing so may mean that your surgery could be cancelled or delayed if the Surgeon or anesthesia staff feels like they are unable to monitor you safely.   Do not shave 48 hours prior to surgery to avoid nicks in your skin which may contribute to postoperative infections.    Contacts, Hearing Aids, dentures or bridgework may not be worn into surgery. DENTURES WILL BE REMOVED PRIOR TO SURGERY PLEASE DO NOT APPLY "Poly grip" OR ADHESIVES!!!  You may bring a small overnight bag with you on the day of surgery, only pack items that are not valuable. [facility] IS NOT RESPONSIBLE   FOR VALUABLES THAT ARE LOST OR STOLEN.   Patients discharged on the day of surgery will not be allowed to drive home.  Someone NEEDS to stay with you for the first 24 hours after anesthesia.  Do not bring your home medications to the hospital. The Pharmacy will dispense medications listed on your medication list to you during your admission in the Hospital.  Please read over the following fact sheets you were given: IF YOU HAVE QUESTIONS ABOUT YOUR PRE-OP INSTRUCTIONS, PLEASE CALL [phone].     Pre-operative 5 CHG Bath Instructions   You can play a key role in reducing the risk of infection after surgery. Your skin needs to be as free of germs as possible. You can reduce the number of germs on your skin by washing with CHG (chlorhexidine gluconate) soap before surgery. CHG is an antiseptic soap that kills germs and continues to kill germs even after  washing.   DO NOT use if you have an allergy to chlorhexidine/CHG or antibacterial soaps. If your skin becomes reddened or irritated, stop using the CHG and notify one of our RNs at [phone]  Please shower with the CHG soap starting 4 days before surgery using the following schedule: START SHOWERS ON   Thursday  12-18-2022  Please keep in mind the following:  DO NOT shave, including legs and underarms, starting the day of your first shower.   You may shave your face at any point before/day of surgery.   Place clean sheets on your bed the day you start using CHG soap. Use a clean washcloth (not used since being washed) for each shower. DO NOT sleep with pets once you start using the CHG.   CHG Shower Instructions:  If you choose to wash your hair and private area, wash first with your normal shampoo/soap.  After you use shampoo/soap, rinse your hair and body thoroughly to remove shampoo/soap residue.  Turn the water OFF and apply about 3 tablespoons (45 ml) of CHG soap to a CLEAN washcloth.  Apply CHG soap ONLY FROM YOUR NECK DOWN TO YOUR TOES (washing for 3-5 minutes)  DO NOT use CHG soap on face, private areas, open wounds, or sores.  Pay special attention to the area where your surgery is being performed.  If you are having back surgery, having someone wash your back for you may be helpful.  Wait 2 minutes after CHG soap is applied, then you may rinse off the CHG soap.  Pat dry with a clean towel  Put on clean clothes/pajamas   If you choose to wear lotion, please use ONLY the CHG-compatible lotions on the back of this paper.     Additional instructions for the day of surgery: DO NOT APPLY any lotions, deodorants, cologne, or perfumes.   Put on clean/comfortable clothes.  Brush your teeth.  Ask  your nurse before applying any prescription medications to the skin.      CHG Compatible Lotions   Aveeno Moisturizing lotion  Cetaphil Moisturizing Cream  Cetaphil Moisturizing Lotion  Clairol Herbal Essence Moisturizing Lotion, Dry Skin  Clairol Herbal Essence Moisturizing Lotion, Extra Dry Skin  Clairol Herbal Essence Moisturizing Lotion, Normal Skin  Curel Age Defying Therapeutic Moisturizing Lotion with Alpha Hydroxy  Curel Extreme Care Body Lotion  Curel Soothing Hands Moisturizing Hand Lotion  Curel Therapeutic Moisturizing Cream, Fragrance-Free  Curel Therapeutic Moisturizing Lotion, Fragrance-Free  Curel Therapeutic Moisturizing Lotion, Original Formula  Eucerin Daily Replenishing Lotion  Eucerin Dry Skin Therapy Plus Alpha Hydroxy Crme  Eucerin Dry Skin Therapy Plus Alpha Hydroxy Lotion  Eucerin Original Crme  Eucerin Original Lotion  Eucerin Plus Crme Eucerin Plus Lotion  Eucerin TriLipid Replenishing Lotion  Keri Anti-Bacterial Hand Lotion  Keri Deep Conditioning Original Lotion Dry Skin Formula Softly Scented  Keri Deep Conditioning Original Lotion, Fragrance Free Sensitive Skin Formula  Keri Lotion Fast Absorbing Fragrance Free Sensitive Skin Formula  Keri Lotion Fast Absorbing Softly Scented Dry Skin Formula  Keri Original Lotion  Keri Skin Renewal Lotion Keri Silky Smooth Lotion  Keri Silky Smooth Sensitive Skin Lotion  Nivea Body Creamy Conditioning Oil  Nivea Body Extra Enriched Lotion  Nivea Body Original Lotion  Nivea Body Sheer Moisturizing Lotion Nivea Crme  Nivea Skin Firming Lotion  NutraDerm 30 Skin Lotion  NutraDerm Skin Lotion  NutraDerm Therapeutic Skin Cream  NutraDerm Therapeutic Skin Lotion  ProShield Protective Hand Cream  Provon moisturizing lotion   FAILURE TO FOLLOW THESE INSTRUCTIONS MAY RESULT IN THE CANCELLATION OF YOUR SURGERY  PATIENT SIGNATURE_________________________________  NURSE  SIGNATURE__________________________________  ________________________________________________________________________      [name]    An incentive spirometer is a tool that can help keep your lungs clear and active. This tool measures how well you are filling your lungs with each breath. Taking long  deep breaths may help reverse or decrease the chance of developing breathing (pulmonary) problems (especially infection) following: A long period of time when you are unable to move or be active. BEFORE THE PROCEDURE  If the spirometer includes an indicator to show your best effort, your nurse or respiratory therapist will set it to a desired goal. If possible, sit up straight or lean slightly forward. Try not to slouch. Hold the incentive spirometer in an upright position. INSTRUCTIONS FOR USE  Sit on the edge of your bed if possible, or sit up as far as you can in bed or on a chair. Hold the incentive spirometer in an upright position. Breathe out normally. Place the mouthpiece in your mouth and seal your lips tightly around it. Breathe in slowly and as deeply as possible, raising the piston or the ball toward the top of the column. Hold your breath for 3-5 seconds or for as long as possible. Allow the piston or ball to fall to the bottom of the column. Remove the mouthpiece from your mouth and breathe out normally. Rest for a few seconds and repeat Steps 1 through 7 at least 10 times every 1-2 hours when you are awake. Take your time and take a few normal breaths between deep breaths. The spirometer may include an indicator to show your best effort. Use the indicator as a goal to work toward during each repetition. After each set of 10 deep breaths, practice coughing to be sure your lungs are clear. If you have an incision (the cut made at the time of surgery), support your incision when coughing by placing a pillow or rolled up towels firmly against it. Once you are able to  get out of bed, walk around indoors and cough well. You may stop using the incentive spirometer when instructed by your caregiver.  RISKS AND COMPLICATIONS Take your time so you do not get dizzy or light-headed. If you are in pain, you may need to take or ask for pain medication before doing incentive spirometry. It is harder to take a deep breath if you are having pain. AFTER USE Rest and breathe slowly and easily. It can be helpful to keep track of a log of your progress. Your caregiver can provide you with a simple table to help with this. If you are using the spirometer at home, follow these instructions: SEEK MEDICAL CARE IF:  You are having difficultly using the spirometer. You have trouble using the spirometer as often as instructed. Your pain medication is not giving enough relief while using the spirometer. You develop fever of 100.5 F (38.1 C) or higher.                                                                                                    SEEK IMMEDIATE MEDICAL CARE IF:  You cough up bloody sputum that had not been present before. You develop fever of 102 F (38.9 C) or greater. You develop worsening pain at or near the incision site. MAKE SURE YOU:  Understand these instructions. Will watch your condition. Will get  help right away if you are not doing well or get worse. Document Released: 05/26/2006 Document Revised: 04/07/2011 Document Reviewed: 07/27/2006 [facility] Patient Information 2014 [place], [place].

## 2022-12-10 ENCOUNTER — Encounter (HOSPITAL_COMMUNITY)
Admission: RE | Admit: 2022-12-10 | Discharge: 2022-12-10 | Disposition: A | Discharge status: Home / Self Care | Payer: 59 | Source: Ambulatory Visit | Attending: Orthopedic Surgery | Admitting: Orthopedic Surgery

## 2022-12-10 ENCOUNTER — Other Ambulatory Visit: Payer: Self-pay

## 2022-12-10 ENCOUNTER — Encounter (HOSPITAL_COMMUNITY): Payer: Self-pay

## 2022-12-10 VITALS — BP 148/76 | HR 76 | Temp 99.1°F | Resp 18 | Ht 67.0 in | Wt 217.0 lb

## 2022-12-10 DIAGNOSIS — E1165 Type 2 diabetes mellitus with hyperglycemia: Secondary | ICD-10-CM | POA: Diagnosis not present

## 2022-12-10 DIAGNOSIS — Z01818 Encounter for other preprocedural examination: Secondary | ICD-10-CM

## 2022-12-10 DIAGNOSIS — Z79891 Long term (current) use of opiate analgesic: Secondary | ICD-10-CM

## 2022-12-10 DIAGNOSIS — I480 Paroxysmal atrial fibrillation: Secondary | ICD-10-CM | POA: Diagnosis not present

## 2022-12-10 DIAGNOSIS — Z794 Long term (current) use of insulin: Secondary | ICD-10-CM | POA: Insufficient documentation

## 2022-12-10 DIAGNOSIS — I10 Essential (primary) hypertension: Secondary | ICD-10-CM

## 2022-12-10 DIAGNOSIS — Z01812 Encounter for preprocedural laboratory examination: Secondary | ICD-10-CM | POA: Diagnosis not present

## 2022-12-10 HISTORY — DX: Pneumonia, unspecified organism: J18.9

## 2022-12-10 LAB — COMPREHENSIVE METABOLIC PANEL
ALT: 11 U/L (ref 0–44)
AST: 14 U/L — ABNORMAL LOW (ref 15–41)
Albumin: 4 g/dL (ref 3.5–5.0)
Alkaline Phosphatase: 65 U/L (ref 38–126)
Anion gap: 11 (ref 5–15)
BUN: 22 mg/dL (ref 8–23)
CO2: 27 mmol/L (ref 22–32)
Calcium: 9.1 mg/dL (ref 8.9–10.3)
Chloride: 101 mmol/L (ref 98–111)
Creatinine, Ser: 1.07 mg/dL — ABNORMAL HIGH (ref 0.44–1.00)
GFR, Estimated: 56 mL/min — ABNORMAL LOW (ref >60)
Glucose, Bld: 113 mg/dL — ABNORMAL HIGH (ref 70–99)
Potassium: 3.7 mmol/L (ref 3.5–5.1)
Sodium: 139 mmol/L (ref 135–145)
Total Bilirubin: 0.5 mg/dL (ref <1.2)
Total Protein: 7.3 g/dL (ref 6.5–8.1)

## 2022-12-10 LAB — CBC
HCT: 34.6 % — ABNORMAL LOW (ref 36.0–46.0)
Hemoglobin: 11.6 g/dL — ABNORMAL LOW (ref 12.0–15.0)
MCH: 30.6 pg (ref 26.0–34.0)
MCHC: 33.5 g/dL (ref 30.0–36.0)
MCV: 91.3 fL (ref 80.0–100.0)
Platelets: 367 10*3/uL (ref 150–400)
RBC: 3.79 MIL/uL — ABNORMAL LOW (ref 3.87–5.11)
RDW: 12.5 % (ref 11.5–15.5)
WBC: 7 10*3/uL (ref 4.0–10.5)
nRBC: 0 % (ref 0.0–0.2)

## 2022-12-10 LAB — GLUCOSE, CAPILLARY: Glucose-Capillary: 99 mg/dL (ref 70–99)

## 2022-12-10 LAB — HEMOGLOBIN A1C
Hgb A1c MFr Bld: 6.7 % — ABNORMAL HIGH (ref 4.8–5.6)
Mean Plasma Glucose: 145.59 mg/dL

## 2022-12-10 LAB — SURGICAL PCR SCREEN
MRSA, PCR: NEGATIVE
Staphylococcus aureus: NEGATIVE

## 2022-12-15 NOTE — Progress Notes (Signed)
Case: [id number] Date/Time: 12/22/22 0845   Procedure: LEFT TOTAL KNEE ARTHROPLASTY (Left: Knee)   Anesthesia type: Spinal   Pre-op diagnosis: LEFT KNEE OSTEOARTHRITIS VALGUS   Location: [facility] ROOM 06 / [facility]   Surgeons: Gean Birchwood, MD       DISCUSSION: [name] is a 72 year old female who presents to PAT prior to surgery above.  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 Last seen in clinic on 08/14/22 by Dr. Ladona Ridgel. Noted to be euvolemic. BP controlled. Device functioning normally. Cleared for surgery:  "the patient is an acceptable surgical candidate for knee replacement surgery and may proceed. She can stop her eliquis for up to 3 days before the surgery ( the half life of eliquis is 8 hours) and restart the eliquis when the bleeding risk is acceptable."  Device orders in 12/09/22 progress note: Electrophysiologist's Recommendations:   Have magnet available. Provide continuous ECG monitoring when magnet is used or reprogramming is to be performed.  Procedure should not interfere with device function.  No device programming or magnet placement needed.  Unable to see PCP records however BP appears controlled. A1c was 6.7. She takes insulin. Patient had 2 ED/UC visits in the last couple months. Was [facility] at [facility] on 10/09/22 for URI symptoms. Treated supportively. Was seen again on 10/27/22 for generalized weakness. W/u remarkable for mild AKI. Cardiology was consulted and it was felt to be possibly related to orthostatic hypotension. Device was interrogated and showed no significant arrhthymias. She was given IVF and felt better.   Eliquis:  hold x  3 days  GLP1 instructions:   Last dose 12-10-2022   VS: BP (!) 148/76 Comment: right arm sitting  Pulse 76   Temp 37.3 C (Oral)   Resp 18   Ht 5\' 7"  (1.702 m)   Wt 98.4 kg   SpO2 100%   BMI 33.99 kg/m   PROVIDERS: Corliss Blacker, MD ([facility]) EP: Lewayne Bunting, MD Cardiologist -  Marca Ancona, MD  LABS: Labs reviewed: Acceptable for surgery. (all labs ordered are listed, but only abnormal results are displayed)  Labs Reviewed  HEMOGLOBIN A1C - Abnormal; Notable for the following components:      Result Value   Hgb A1c MFr Bld 6.7 (*)    All other components within normal limits  COMPREHENSIVE METABOLIC PANEL - Abnormal; Notable for the following components:   Glucose, Bld 113 (*)    Creatinine, Ser 1.07 (*)    AST 14 (*)    GFR, Estimated 56 (*)    All other components within normal limits  CBC - Abnormal; Notable for the following components:   RBC 3.79 (*)    Hemoglobin 11.6 (*)    HCT 34.6 (*)    All other components within normal limits  SURGICAL PCR SCREEN  GLUCOSE, CAPILLARY     IMAGES:   EKG 10/27/22 Sinus rhythm IVCD, consider atypical LBBB when compared to prior, similar long QTC but does nor appears paced at this time.   CV:  Device check 10/01/22:  Scheduled remote reviewed. Normal device function.   There was one NSVT arrhythmia detected  Next remote 91 days.   Echo 06/14/22:  IMPRESSIONS    1. Hypokinesis of the inferolateral wall with overall mild LV dysfunction.  2. Left ventricular ejection fraction, by estimation, is 45 to 50%. The left ventricle has mildly decreased function. The left ventricle demonstrates regional wall motion abnormalities (see scoring diagram/findings for description). There is severe left  ventricular hypertrophy. Left ventricular diastolic parameters are consistent with Grade II diastolic dysfunction (pseudonormalization). Elevated left atrial pressure. The average left ventricular  global longitudinal strain is -12.1 %. The global longitudinal strain is abnormal.  3. Right ventricular systolic function is normal. The right ventricular size is normal.  4. Left atrial size was mildly dilated.  5. Right atrial size was mildly dilated.  6. The mitral valve is normal in structure. Trivial mitral valve regurgitation. No evidence of mitral stenosis.  7. The aortic valve is tricuspid. Aortic valve regurgitation is not visualized. No aortic stenosis is present.  8. The inferior vena cava is dilated in size with >50% respiratory variability, suggesting right atrial pressure of 8 mmHg.  Past Medical History:  Diagnosis Date   Anemia    a. Noted on 07/2012 labs, instructed to f/u PCP.   Arthritis    "joints" (11/18/2012)   CAD (coronary artery disease), native coronary artery    a. Nonobstructive by cath 02/2012 (done because of low EF).   Chronic bronchitis (HCC)    "~ every other year" (11/18/2012)   Chronic combined systolic and diastolic CHF (congestive heart failure) (HCC)    a. 03/05/12 echo:  LVEF 20-25%, moderate LVH , inferior and basal to mid septal akinesis, anterior moderate to severe hypokinesis and grade 2 diastolic dysfunction. b. EF 07/2012: EF still 25% (unclear medication compliance).   Chronic lower back pain    Headache(784.0)    "often; maybe not daily" (11/18/2012)   High cholesterol    History of noncompliance with medical treatment    Hypertension    LBBB (left bundle branch block)    Orthopnea    Pneumonia    Tobacco abuse    Type II diabetes mellitus (HCC)     Past Surgical History:  Procedure Laterality Date   BI-VENTRICULAR IMPLANTABLE CARDIOVERTER DEFIBRILLATOR N/A 11/18/2012   Procedure: BI-VENTRICULAR IMPLANTABLE CARDIOVERTER DEFIBRILLATOR  (CRT-D);  Surgeon: Marinus Maw, MD;  Location: [facility] CATH LAB;  Service: Cardiovascular;  Laterality: N/A;   BI-VENTRICULAR IMPLANTABLE CARDIOVERTER DEFIBRILLATOR  (CRT-D)  11/18/2012   BIV ICD  GENERATOR CHANGEOUT N/A 11/22/2020   Procedure: BIV ICD GENERATOR CHANGEOUT;  Surgeon: Marinus Maw, MD;  Location: [facility] INVASIVE CV LAB;  Service: Cardiovascular;  Laterality: N/A;   CARDIAC CATHETERIZATION  03/04/12   nonobstructive CAD, elevated LVEDP and tortuous vessels suggestive of long-standing hypertension   COLONOSCOPY WITH PROPOFOL N/A 12/12/2016   Procedure: COLONOSCOPY WITH PROPOFOL;  Surgeon: Jeani Hawking, MD;  Location: [facility] ENDOSCOPY;  Service: Endoscopy;  Laterality: N/A;   JOINT REPLACEMENT     Bilateral hip and right knee   LEFT HEART CATH N/A 03/05/2012   Procedure: LEFT HEART CATH;  Surgeon: Laurey Morale, MD;  Location: [facility] CATH LAB;  Service: Cardiovascular;  Laterality: N/A;    MEDICATIONS:  apixaban (ELIQUIS) 5 MG TABS tablet   gabapentin (NEURONTIN) 100 MG capsule   glucose blood (FREESTYLE TEST STRIPS) test strip   glucose monitoring kit (FREESTYLE) monitoring kit   HUMALOG KWIKPEN 100 UNIT/ML KwikPen   insulin glargine (LANTUS) 100 UNIT/ML Solostar Pen   Insulin Pen Needle 32G X 8 MM MISC   isosorbide-hydrALAZINE (BIDIL) 20-37.5 MG tablet  Lancets (FREESTYLE) lancets   oxyCODONE (ROXICODONE) 5 MG immediate release tablet   Semaglutide,0.25 or 0.5MG /DOS, (OZEMPIC, 0.25 OR 0.5 MG/DOSE,) 2 MG/1.5ML SOPN   Tafamidis (VYNDAMAX) 61 MG CAPS   torsemide (DEMADEX) 100 MG tablet   No current facility-administered medications for this encounter.   Marcille Blanco [facility] Surgical Short Stay/Anesthesiology [facility] Phone [phone] 12/15/2022 10:51 AM

## 2022-12-15 NOTE — Anesthesia Preprocedure Evaluation (Addendum)
Anesthesia Evaluation  Patient identified by MRN, date of birth, ID band Patient awake    Reviewed: Allergy & Precautions, NPO status , Patient's Chart, lab work & pertinent test results  Airway Mallampati: II  TM Distance: >3 FB Neck ROM: Full    Dental  (+) Dental Advisory Given   Pulmonary sleep apnea , former smoker   breath sounds clear to auscultation       Cardiovascular hypertension, Pt. on medications + CAD and +CHF  + dysrhythmias Atrial Fibrillation + pacemaker + Cardiac Defibrillator  Rhythm:Regular Rate:Normal     Neuro/Psych  Neuromuscular disease    GI/Hepatic negative GI ROS, Neg liver ROS,,,  Endo/Other  diabetes, Type 2    Renal/GU negative Renal ROS     Musculoskeletal  (+) Arthritis ,    Abdominal   Peds  Hematology  (+) Blood dyscrasia, anemia   Anesthesia Other Findings   Reproductive/Obstetrics                              Lab Results  Component Value Date   WBC 7.0 12/10/2022   HGB 11.6 (L) 12/10/2022   HCT 34.6 (L) 12/10/2022   MCV 91.3 12/10/2022   PLT 367 12/10/2022   Lab Results  Component Value Date   NA 139 12/10/2022   CL 101 12/10/2022   K 3.7 12/10/2022   CO2 27 12/10/2022   BUN 22 12/10/2022   CREATININE 1.07 (H) 12/10/2022   GFRNONAA 56 (L) 12/10/2022   CALCIUM 9.1 12/10/2022   PHOS 3.3 10/25/2017   ALBUMIN 4.0 12/10/2022   GLUCOSE 113 (H) 12/10/2022    Anesthesia Physical Anesthesia Plan  ASA: 3  Anesthesia Plan: Spinal   Post-op Pain Management: Regional block* and Tylenol PO (pre-op)*   Induction:   PONV Risk Score and Plan: 2 and Dexamethasone, Ondansetron, Propofol infusion and Treatment may vary due to age or medical condition  Airway Management Planned: Natural Airway and Simple Face Mask  Additional Equipment:   Intra-op Plan:   Post-operative Plan:   Informed Consent: I have reviewed the patients History and  Physical, chart, labs and discussed the procedure including the risks, benefits and alternatives for the proposed anesthesia with the patient or authorized representative who has indicated his/her understanding and acceptance.     Dental advisory given  Plan Discussed with:   Anesthesia Plan Comments: ( )         Anesthesia Quick Evaluation

## 2022-12-17 DIAGNOSIS — M1712 Unilateral primary osteoarthritis, left knee: Secondary | ICD-10-CM | POA: Diagnosis present

## 2022-12-17 NOTE — H&P (Signed)
TOTAL KNEE ADMISSION H&P  Patient is being admitted for left total knee arthroplasty.  Subjective:  Chief Complaint:left knee pain.  HPI: [name], 73 y.o. female, has a history of pain and functional disability in the left knee due to arthritis and has failed non-surgical conservative treatments for greater than 12 weeks to includeNSAID's and/or analgesics, corticosteriod injections, use of assistive devices, weight reduction as appropriate, and activity modification.  Onset of symptoms was gradual, starting 5 years ago with gradually worsening course since that time. The patient noted no past surgery on the left knee(s).  Patient currently rates pain in the left knee(s) at 10 out of 10 with activity. Patient has night pain, worsening of pain with activity and weight bearing, pain that interferes with activities of daily living, pain with passive range of motion, crepitus, and joint swelling.  Patient has evidence of periarticular osteophytes and joint space narrowing by imaging studies.  There is no active infection.  Patient Active Problem List   Diagnosis Date Noted   Degenerative arthritis of left knee 12/17/2022   Right foot infection 10/22/2017   Cellulitis in diabetic foot (HCC) 10/22/2017   Hyperglycemia 10/22/2017   Lumbar radiculopathy 09/18/2017   Degenerative spondylolisthesis 09/18/2017   Atrial fibrillation (HCC) 02/11/2017   Paroxysmal A-fib (HCC)    Biventricular automatic implantable cardioverter defibrillator in situ    Sepsis (HCC) 04/20/2016   UTI (urinary tract infection) 04/20/2016   Dyspnea 07/19/2015   Interstitial lung disease (HCC) 11/20/2014   SIRS (systemic inflammatory response syndrome) (HCC) 02/03/2014   IDDM (insulin dependent diabetes mellitus) 02/03/2014   Tachycardia 06/14/2013   Chronic systolic CHF (congestive heart failure) (HCC) 09/29/2012   Hypoxia 03/06/2012   Hypertensive heart disease 03/06/2012   Nonischemic cardiomyopathy (HCC)  03/06/2012   Tobacco abuse 03/06/2012   Type 2 diabetes mellitus (HCC) 03/06/2012   Hypertension 03/06/2012   CAD (coronary artery disease), native coronary artery 03/06/2012   Hypokalemia 03/06/2012   Acute bronchitis 02/01/2009   Sleep apnea 02/01/2009   Chest pain 02/01/2009   Past Medical History:  Diagnosis Date   Anemia    a. Noted on 07/2012 labs, instructed to f/u PCP.   Arthritis    "joints" (11/18/2012)   CAD (coronary artery disease), native coronary artery    a. Nonobstructive by cath 02/2012 (done because of low EF).   Chronic bronchitis (HCC)    "~ every other year" (11/18/2012)   Chronic combined systolic and diastolic CHF (congestive heart failure) (HCC)    a. 03/05/12 echo:  LVEF 20-25%, moderate LVH , inferior and basal to mid septal akinesis, anterior moderate to severe hypokinesis and grade 2 diastolic dysfunction. b. EF 07/2012: EF still 25% (unclear medication compliance).   Chronic lower back pain    Headache(784.0)    "often; maybe not daily" (11/18/2012)   High cholesterol    History of noncompliance with medical treatment    Hypertension    LBBB (left bundle branch block)    Orthopnea    Pneumonia    Tobacco abuse    Type II diabetes mellitus (HCC)     Past Surgical History:  Procedure Laterality Date   BI-VENTRICULAR IMPLANTABLE CARDIOVERTER DEFIBRILLATOR N/A 11/18/2012   Procedure: BI-VENTRICULAR IMPLANTABLE CARDIOVERTER DEFIBRILLATOR  (CRT-D);  Surgeon: Marinus Maw, MD;  Location: [facility] CATH LAB;  Service: Cardiovascular;  Laterality: N/A;   BI-VENTRICULAR IMPLANTABLE CARDIOVERTER DEFIBRILLATOR  (CRT-D)  11/18/2012   BIV ICD GENERATOR CHANGEOUT N/A 11/22/2020   Procedure: BIV ICD GENERATOR CHANGEOUT;  Surgeon: Marinus Maw, MD;  Location: [facility] INVASIVE CV LAB;  Service: Cardiovascular;  Laterality: N/A;   CARDIAC CATHETERIZATION  03/04/12   nonobstructive CAD, elevated LVEDP and tortuous vessels suggestive of long-standing hypertension   COLONOSCOPY  WITH PROPOFOL N/A 12/12/2016   Procedure: COLONOSCOPY WITH PROPOFOL;  Surgeon: Jeani Hawking, MD;  Location: [facility] ENDOSCOPY;  Service: Endoscopy;  Laterality: N/A;   JOINT REPLACEMENT     Bilateral hip and right knee   LEFT HEART CATH N/A 03/05/2012   Procedure: LEFT HEART CATH;  Surgeon: Laurey Morale, MD;  Location: [facility] CATH LAB;  Service: Cardiovascular;  Laterality: N/A;    No current facility-administered medications for this encounter.   Current Outpatient Medications  Medication Sig Dispense Refill Last Dose   apixaban (ELIQUIS) 5 MG TABS tablet TAKE 1 TABLET(5 MG) BY MOUTH TWICE DAILY 180 tablet 2    gabapentin (NEURONTIN) 100 MG capsule Take 100 mg by mouth 3 (three) times daily.      HUMALOG KWIKPEN 100 UNIT/ML KwikPen Inject 20 Units into the skin 2 (two) times daily. Sliding scale      insulin glargine (LANTUS) 100 UNIT/ML Solostar Pen Inject 50 Units into the skin at bedtime.      isosorbide-hydrALAZINE (BIDIL) 20-37.5 MG tablet Take 2 tablets by mouth 3 (three) times daily.      oxyCODONE (ROXICODONE) 5 MG immediate release tablet Take 1 tablet (5 mg total) by mouth every 4 (four) hours as needed for severe pain. 30 tablet 0    Semaglutide,0.25 or 0.5MG /DOS, (OZEMPIC, 0.25 OR 0.5 MG/DOSE,) 2 MG/1.5ML SOPN Inject 0.5 mg into the skin every Wednesday.      Tafamidis (VYNDAMAX) 61 MG CAPS TAKE 1 CAPSULE (61 MG) BY MOUTH DAILY. 90 capsule 3    torsemide (DEMADEX) 100 MG tablet Take 150 mg by mouth daily.      glucose blood (FREESTYLE TEST STRIPS) test strip Use as instructed 100 each 0    glucose monitoring kit (FREESTYLE) monitoring kit 1 each by Does not apply route 4 (four) times daily - after meals and at bedtime. 1 month Diabetic Testing Supplies for QAC-QHS accuchecks. 1 each 1    Insulin Pen Needle 32G X 8 MM MISC Use as directed 100 each 0    Lancets (FREESTYLE) lancets Use as instructed 100 each 0    Allergies  Allergen Reactions   Morphine And Codeine Nausea Only     Severe nausea   Penicillins Other (See Comments)    Pt states she has had a pain in her leg since a penicillin injection 2 months ago (reported 08/31/17).  Has tolerated amoxicillin oral.      Social History   Tobacco Use   Smoking status: Former    Current packs/day: 0.00    Average packs/day: 1 pack/day for 40.0 years (40.0 ttl pk-yrs)    Types: Cigarettes    Start date: 06/23/1972    Quit date: 03/26/2012    Years since quitting: 10.7   Smokeless tobacco: Never  Substance Use Topics   Alcohol use: No    Family History  Problem Relation Age of Onset   Heart failure Mother    Heart disease Neg Hx      Review of Systems  Constitutional: Negative.   HENT: Negative.    Eyes: Negative.   Cardiovascular:  Positive for palpitations.  Endocrine:       Blood sugar problem  Genitourinary: Negative.   Musculoskeletal:  Positive for arthralgias.  Allergic/Immunologic: Negative.  Neurological: Negative.   Hematological: Negative.   Psychiatric/Behavioral: Negative.      Objective:  Physical Exam Constitutional:      Appearance: Normal appearance. She is normal weight.  HENT:     Head: Normocephalic and atraumatic.  Eyes:     Pupils: Pupils are equal, round, and reactive to light.  Cardiovascular:     Pulses: Normal pulses.  Pulmonary:     Effort: Pulmonary effort is normal.  Musculoskeletal:        General: Swelling, tenderness and deformity present.     Cervical back: Normal range of motion and neck supple.     Comments: Her right total knee wound is well-healed no effusion collateral ligaments are stable range of motion of the total knee on the right is from 3-110.  Her left knee has an obvious 10-12 valgus deformity collateral ligaments are intact she is nontender to the medial collateral ligament.  Range of motion is from 5-95 with discomfort she is tender along the lateral joint line.  There is crepitus.  Skin is intact neurovascular intact distally toes are pink and  well perfused.  Skin:    General: Skin is warm and dry.  Neurological:     General: No focal deficit present.     Mental Status: She is alert and oriented to person, place, and time. Mental status is at baseline.  Psychiatric:        Mood and Affect: Mood normal.        Behavior: Behavior normal.        Thought Content: Thought content normal.        Judgment: Judgment normal.     Vital signs in last 24 hours:    Labs:   Estimated body mass index is 33.99 kg/m as calculated from the following:   Height as of 12/10/22: 5\' 7"  (1.702 m).   Weight as of 12/10/22: 98.4 kg.   Imaging Review Plain radiographs demonstrate  contralateral left knee on AP Rosenberg lateral and sunrise views shows end-stage arthritis of the lateral compartment bone-on-bone with some erosion of the lateral femur and the lateral tibial plateau.  There is also some lateral subluxation of the tibia beneath the femur    Assessment/Plan:  End stage arthritis, left knee   The patient history, physical examination, clinical judgment of the provider and imaging studies are consistent with end stage degenerative joint disease of the left knee(s) and total knee arthroplasty is deemed medically necessary. The treatment options including medical management, injection therapy arthroscopy and arthroplasty were discussed at length. The risks and benefits of total knee arthroplasty were presented and reviewed. The risks due to aseptic loosening, infection, stiffness, patella tracking problems, thromboembolic complications and other imponderables were discussed. The patient acknowledged the explanation, agreed to proceed with the plan and consent was signed. Patient is being admitted for inpatient treatment for surgery, pain control, PT, OT, prophylactic antibiotics, VTE prophylaxis, progressive ambulation and ADL's and discharge planning. The patient is planning to be discharged home with home health services     Patient's  anticipated LOS is less than 2 midnights, meeting these requirements: - Younger than 13 - Lives within 1 hour of care - Has a competent adult at home to recover with post-op recover - NO history of  - Chronic pain requiring opiods  - Diabetes  - Coronary Artery Disease  - Heart failure  - Heart attack  - Stroke  - DVT/VTE  - Cardiac arrhythmia  - Respiratory Failure/COPD  -  Renal failure  - Anemia  - Advanced Liver disease

## 2022-12-21 MED ORDER — TRANEXAMIC ACID 1000 MG/10ML IV SOLN
2000.0000 mg | INTRAVENOUS | Status: DC
  Filled 2022-12-21: qty 20

## 2022-12-22 ENCOUNTER — Ambulatory Visit (HOSPITAL_COMMUNITY): Payer: 59 | Admitting: Physician Assistant

## 2022-12-22 ENCOUNTER — Ambulatory Visit (HOSPITAL_COMMUNITY): Payer: 59 | Admitting: Anesthesiology

## 2022-12-22 ENCOUNTER — Other Ambulatory Visit: Payer: Self-pay

## 2022-12-22 ENCOUNTER — Encounter (HOSPITAL_COMMUNITY)
Admission: RE | Disposition: A | Discharge status: Home / Self Care | Payer: Self-pay | Source: Ambulatory Visit | Attending: Orthopedic Surgery

## 2022-12-22 ENCOUNTER — Encounter (HOSPITAL_COMMUNITY): Payer: Self-pay | Admitting: Orthopedic Surgery

## 2022-12-22 ENCOUNTER — Ambulatory Visit (HOSPITAL_COMMUNITY)
Admission: RE | Admit: 2022-12-22 | Discharge: 2022-12-22 | Disposition: A | Discharge status: Home / Self Care | Payer: 59 | Source: Ambulatory Visit | Attending: Orthopedic Surgery | Admitting: Orthopedic Surgery

## 2022-12-22 DIAGNOSIS — Z7985 Long-term (current) use of injectable non-insulin antidiabetic drugs: Secondary | ICD-10-CM | POA: Insufficient documentation

## 2022-12-22 DIAGNOSIS — I480 Paroxysmal atrial fibrillation: Secondary | ICD-10-CM | POA: Diagnosis not present

## 2022-12-22 DIAGNOSIS — I110 Hypertensive heart disease with heart failure: Secondary | ICD-10-CM | POA: Diagnosis not present

## 2022-12-22 DIAGNOSIS — E119 Type 2 diabetes mellitus without complications: Secondary | ICD-10-CM | POA: Diagnosis not present

## 2022-12-22 DIAGNOSIS — Z87891 Personal history of nicotine dependence: Secondary | ICD-10-CM | POA: Insufficient documentation

## 2022-12-22 DIAGNOSIS — M25762 Osteophyte, left knee: Secondary | ICD-10-CM | POA: Insufficient documentation

## 2022-12-22 DIAGNOSIS — G4730 Sleep apnea, unspecified: Secondary | ICD-10-CM | POA: Diagnosis not present

## 2022-12-22 DIAGNOSIS — M1712 Unilateral primary osteoarthritis, left knee: Secondary | ICD-10-CM

## 2022-12-22 DIAGNOSIS — Z794 Long term (current) use of insulin: Secondary | ICD-10-CM | POA: Insufficient documentation

## 2022-12-22 DIAGNOSIS — I5022 Chronic systolic (congestive) heart failure: Secondary | ICD-10-CM | POA: Diagnosis not present

## 2022-12-22 DIAGNOSIS — I2510 Atherosclerotic heart disease of native coronary artery without angina pectoris: Secondary | ICD-10-CM | POA: Diagnosis not present

## 2022-12-22 DIAGNOSIS — E1165 Type 2 diabetes mellitus with hyperglycemia: Secondary | ICD-10-CM

## 2022-12-22 DIAGNOSIS — Z95810 Presence of automatic (implantable) cardiac defibrillator: Secondary | ICD-10-CM | POA: Insufficient documentation

## 2022-12-22 DIAGNOSIS — Z01818 Encounter for other preprocedural examination: Secondary | ICD-10-CM

## 2022-12-22 HISTORY — PX: TOTAL KNEE ARTHROPLASTY: SHX125

## 2022-12-22 LAB — GLUCOSE, CAPILLARY
Glucose-Capillary: 84 mg/dL (ref 70–99)
Glucose-Capillary: 83 mg/dL (ref 70–99)

## 2022-12-22 SURGERY — ARTHROPLASTY, KNEE, TOTAL
Anesthesia: Spinal | Site: Knee | Laterality: Left

## 2022-12-22 MED ORDER — CHLORHEXIDINE GLUCONATE 0.12 % MT SOLN
15.0000 mL | Freq: Once | OROMUCOSAL | Status: AC
  Administered 2022-12-22: 15 mL via OROMUCOSAL

## 2022-12-22 MED ORDER — CEFAZOLIN SODIUM-DEXTROSE 2-4 GM/100ML-% IV SOLN
2.0000 g | INTRAVENOUS | Status: AC
  Administered 2022-12-22: 2 g via INTRAVENOUS
  Filled 2022-12-22: qty 100

## 2022-12-22 MED ORDER — ALBUMIN HUMAN 5 % IV SOLN: INTRAVENOUS | Status: DC | PRN

## 2022-12-22 MED ORDER — ACETAMINOPHEN 325 MG PO TABS: 325.0000 mg | ORAL_TABLET | Freq: Four times a day (QID) | ORAL | Status: DC | PRN

## 2022-12-22 MED ORDER — ORAL CARE MOUTH RINSE: 15.0000 mL | Freq: Once | OROMUCOSAL | Status: AC

## 2022-12-22 MED ORDER — POVIDONE-IODINE 10 % EX SWAB: 2.0000 | Freq: Once | CUTANEOUS | Status: DC

## 2022-12-22 MED ORDER — DEXMEDETOMIDINE HCL IN NACL 80 MCG/20ML IV SOLN
INTRAVENOUS | Status: AC
  Filled 2022-12-22: qty 20

## 2022-12-22 MED ORDER — SODIUM CHLORIDE (PF) 0.9 % IJ SOLN
INTRAMUSCULAR | Status: DC | PRN
  Administered 2022-12-22: 120 mL

## 2022-12-22 MED ORDER — LIDOCAINE HCL (PF) 2 % IJ SOLN
INTRAMUSCULAR | Status: AC
  Filled 2022-12-22: qty 5

## 2022-12-22 MED ORDER — SODIUM CHLORIDE (PF) 0.9 % IJ SOLN
INTRAMUSCULAR | Status: AC
  Filled 2022-12-22: qty 20

## 2022-12-22 MED ORDER — APIXABAN 2.5 MG PO TABS: 2.5000 mg | ORAL_TABLET | Freq: Two times a day (BID) | ORAL | 0 refills | Status: DC

## 2022-12-22 MED ORDER — PROPOFOL 500 MG/50ML IV EMUL
INTRAVENOUS | Status: DC | PRN
  Administered 2022-12-22: 50 ug/kg/min via INTRAVENOUS

## 2022-12-22 MED ORDER — BUPIVACAINE-EPINEPHRINE 0.25% -1:200000 IJ SOLN
INTRAMUSCULAR | Status: AC
  Filled 2022-12-22: qty 1

## 2022-12-22 MED ORDER — AMISULPRIDE (ANTIEMETIC) 5 MG/2ML IV SOLN: 10.0000 mg | Freq: Once | INTRAVENOUS | Status: DC | PRN

## 2022-12-22 MED ORDER — FENTANYL CITRATE (PF) 100 MCG/2ML IJ SOLN
INTRAMUSCULAR | Status: DC | PRN
  Administered 2022-12-22: 25 ug via INTRAVENOUS

## 2022-12-22 MED ORDER — ACETAMINOPHEN 500 MG PO TABS
1000.0000 mg | ORAL_TABLET | Freq: Once | ORAL | Status: AC
  Administered 2022-12-22: 1000 mg via ORAL

## 2022-12-22 MED ORDER — TIZANIDINE HCL 2 MG PO TABS: 2.0000 mg | ORAL_TABLET | Freq: Three times a day (TID) | ORAL | 0 refills | Status: DC

## 2022-12-22 MED ORDER — BUPIVACAINE IN DEXTROSE 0.75-8.25 % IT SOLN
INTRATHECAL | Status: DC | PRN
  Administered 2022-12-22: 1.8 mL via INTRATHECAL

## 2022-12-22 MED ORDER — LACTATED RINGERS IV BOLUS
250.0000 mL | Freq: Once | INTRAVENOUS | Status: DC
Start: 2022-12-22 — End: 2022-12-22

## 2022-12-22 MED ORDER — LACTATED RINGERS IV BOLUS
500.0000 mL | Freq: Once | INTRAVENOUS | Status: AC
  Administered 2022-12-22: 500 mL via INTRAVENOUS

## 2022-12-22 MED ORDER — ONDANSETRON HCL 4 MG/2ML IJ SOLN
INTRAMUSCULAR | Status: AC
  Filled 2022-12-22: qty 2

## 2022-12-22 MED ORDER — TRANEXAMIC ACID-NACL 1000-0.7 MG/100ML-% IV SOLN
1000.0000 mg | Freq: Once | INTRAVENOUS | Status: DC
Start: 2022-12-22 — End: 2022-12-22

## 2022-12-22 MED ORDER — OXYCODONE HCL 5 MG PO TABS: 5.0000 mg | ORAL_TABLET | ORAL | 0 refills | Status: AC | PRN

## 2022-12-22 MED ORDER — FENTANYL CITRATE PF 50 MCG/ML IJ SOSY
100.0000 ug | PREFILLED_SYRINGE | Freq: Once | INTRAMUSCULAR | Status: DC
  Filled 2022-12-22: qty 2

## 2022-12-22 MED ORDER — LACTATED RINGERS IV SOLN: INTRAVENOUS | Status: DC

## 2022-12-22 MED ORDER — BUPIVACAINE LIPOSOME 1.3 % IJ SUSP: 20.0000 mL | Freq: Once | INTRAMUSCULAR | Status: DC

## 2022-12-22 MED ORDER — ONDANSETRON HCL 4 MG/2ML IJ SOLN
INTRAMUSCULAR | Status: DC | PRN
  Administered 2022-12-22: 4 mg via INTRAVENOUS

## 2022-12-22 MED ORDER — FENTANYL CITRATE PF 50 MCG/ML IJ SOSY: 25.0000 ug | PREFILLED_SYRINGE | INTRAMUSCULAR | Status: DC | PRN

## 2022-12-22 MED ORDER — DEXAMETHASONE SODIUM PHOSPHATE 10 MG/ML IJ SOLN
INTRAMUSCULAR | Status: AC
  Filled 2022-12-22: qty 1

## 2022-12-22 MED ORDER — 0.9 % SODIUM CHLORIDE (POUR BTL) OPTIME
TOPICAL | Status: DC | PRN
  Administered 2022-12-22: 1000 mL

## 2022-12-22 MED ORDER — TRANEXAMIC ACID-NACL 1000-0.7 MG/100ML-% IV SOLN
1000.0000 mg | INTRAVENOUS | Status: AC
  Administered 2022-12-22: 1000 mg via INTRAVENOUS
  Filled 2022-12-22: qty 100

## 2022-12-22 MED ORDER — PHENYLEPHRINE 80 MCG/ML (10ML) SYRINGE FOR IV PUSH (FOR BLOOD PRESSURE SUPPORT)
PREFILLED_SYRINGE | INTRAVENOUS | Status: AC
  Filled 2022-12-22: qty 10

## 2022-12-22 MED ORDER — PROPOFOL 1000 MG/100ML IV EMUL
INTRAVENOUS | Status: AC
  Filled 2022-12-22: qty 100

## 2022-12-22 MED ORDER — BUPIVACAINE LIPOSOME 1.3 % IJ SUSP
INTRAMUSCULAR | Status: AC
  Filled 2022-12-22: qty 20

## 2022-12-22 MED ORDER — TRANEXAMIC ACID 1000 MG/10ML IV SOLN
INTRAVENOUS | Status: DC | PRN
  Administered 2022-12-22: 2000 mg via TOPICAL

## 2022-12-22 MED ORDER — PHENYLEPHRINE HCL-NACL 20-0.9 MG/250ML-% IV SOLN
INTRAVENOUS | Status: DC | PRN
  Administered 2022-12-22: 20 ug/min via INTRAVENOUS

## 2022-12-22 MED ORDER — SODIUM CHLORIDE (PF) 0.9 % IJ SOLN
INTRAMUSCULAR | Status: AC
  Filled 2022-12-22: qty 50

## 2022-12-22 MED ORDER — FENTANYL CITRATE (PF) 100 MCG/2ML IJ SOLN
INTRAMUSCULAR | Status: AC
  Filled 2022-12-22: qty 2

## 2022-12-22 MED ORDER — LIDOCAINE HCL (CARDIAC) PF 100 MG/5ML IV SOSY
PREFILLED_SYRINGE | INTRAVENOUS | Status: DC | PRN
  Administered 2022-12-22: 40 mg via INTRAVENOUS

## 2022-12-22 MED ORDER — CLONIDINE HCL (ANALGESIA) 100 MCG/ML EP SOLN
EPIDURAL | Status: DC | PRN
  Administered 2022-12-22: 50 ug

## 2022-12-22 MED ORDER — ALBUMIN HUMAN 5 % IV SOLN
INTRAVENOUS | Status: AC
  Filled 2022-12-22: qty 250

## 2022-12-22 MED ORDER — WATER FOR IRRIGATION, STERILE IR SOLN
Status: DC | PRN
  Administered 2022-12-22: 1000 mL

## 2022-12-22 MED ORDER — SODIUM CHLORIDE 0.9 % IR SOLN
Status: DC | PRN
  Administered 2022-12-22: 1000 mL

## 2022-12-22 MED ORDER — MIDAZOLAM HCL 2 MG/2ML IJ SOLN: 2.0000 mg | Freq: Once | INTRAMUSCULAR | Status: DC

## 2022-12-22 MED ORDER — DEXMEDETOMIDINE HCL IN NACL 80 MCG/20ML IV SOLN
INTRAVENOUS | Status: DC | PRN
  Administered 2022-12-22 (×2): 4 ug via INTRAVENOUS

## 2022-12-22 MED ORDER — PHENYLEPHRINE HCL-NACL 20-0.9 MG/250ML-% IV SOLN
INTRAVENOUS | Status: AC
  Filled 2022-12-22: qty 250

## 2022-12-22 MED ORDER — PHENYLEPHRINE HCL (PRESSORS) 10 MG/ML IV SOLN
INTRAVENOUS | Status: DC | PRN
  Administered 2022-12-22: 100 ug via INTRAVENOUS

## 2022-12-22 MED ORDER — ROPIVACAINE HCL 5 MG/ML IJ SOLN
INTRAMUSCULAR | Status: DC | PRN
  Administered 2022-12-22: 20 mL via PERINEURAL

## 2022-12-22 MED ORDER — DEXAMETHASONE SODIUM PHOSPHATE 10 MG/ML IJ SOLN
INTRAMUSCULAR | Status: DC | PRN
  Administered 2022-12-22 (×2): 2 mg via INTRAVENOUS

## 2022-12-22 SURGICAL SUPPLY — 39 items
ATTUNE MED DOME PAT 41 KNEE (Knees) IMPLANT
ATTUNE PS FEM LT SZ 6 CEM KNEE (Femur) IMPLANT
ATTUNE PSRP INSR SZ6 5 KNEE (Insert) IMPLANT
BAG COUNTER SPONGE SURGICOUNT (BAG) IMPLANT
BAG DECANTER FOR FLEXI CONT (MISCELLANEOUS) ×2 IMPLANT
BAG ZIPLOCK 12X15 (MISCELLANEOUS) ×2 IMPLANT
BASE TIBIAL ROT PLAT SZ 7 KNEE (Knees) IMPLANT
BLADE SAG 18X100X1.27 (BLADE) ×2 IMPLANT
BLADE SAW SGTL 11.0X1.19X90.0M (BLADE) ×2 IMPLANT
BNDG ELASTIC 6INX 5YD STR LF (GAUZE/BANDAGES/DRESSINGS) IMPLANT
BNDG ELASTIC 6X10 VLCR STRL LF (GAUZE/BANDAGES/DRESSINGS) ×2 IMPLANT
BOWL SMART MIX CTS (DISPOSABLE) ×2 IMPLANT
CEMENT HV SMART SET (Cement) ×4 IMPLANT
COVER SURGICAL LIGHT HANDLE (MISCELLANEOUS) ×2 IMPLANT
DRAPE INCISE IOBAN 66X45 STRL (DRAPES) IMPLANT
DRAPE U-SHAPE 47X51 STRL (DRAPES) ×2 IMPLANT
DRSG AQUACEL AG ADV 3.5X10 (GAUZE/BANDAGES/DRESSINGS) ×2 IMPLANT
DURAPREP 26ML APPLICATOR (WOUND CARE) ×2 IMPLANT
ELECT REM PT RETURN 15FT ADLT (MISCELLANEOUS) ×2 IMPLANT
GLOVE BIO SURGEON STRL SZ7.5 (GLOVE) ×2 IMPLANT
GLOVE BIO SURGEON STRL SZ8.5 (GLOVE) ×2 IMPLANT
GLOVE BIOGEL PI IND STRL 8 (GLOVE) ×2 IMPLANT
GLOVE BIOGEL PI IND STRL 9 (GLOVE) ×2 IMPLANT
GOWN STRL REUS W/ TWL XL LVL3 (GOWN DISPOSABLE) ×4 IMPLANT
HOOD PEEL AWAY T7 (MISCELLANEOUS) ×6 IMPLANT
KIT TURNOVER KIT A (KITS) IMPLANT
NS IRRIG 1000ML POUR BTL (IV SOLUTION) ×2 IMPLANT
PACK TOTAL KNEE CUSTOM (KITS) ×2 IMPLANT
PIN STEINMAN FIXATION KNEE (PIN) IMPLANT
PROTECTOR NERVE ULNAR (MISCELLANEOUS) ×2 IMPLANT
SET HNDPC FAN SPRY TIP SCT (DISPOSABLE) ×2 IMPLANT
SUT VIC AB 1 CTX36XBRD ANBCTR (SUTURE) ×2 IMPLANT
SUT VICRYL+ 3-0 36IN CT-1 (SUTURE) ×6 IMPLANT
SYR CONTROL 10ML LL (SYRINGE) ×4 IMPLANT
TIBIAL BASE ROT PLAT SZ 7 KNEE (Knees) ×1 IMPLANT
TRAY CATH INTERMITTENT SS 16FR (CATHETERS) ×2 IMPLANT
TUBE SUCTION HIGH CAP CLEAR NV (SUCTIONS) ×2 IMPLANT
WATER STERILE IRR 1000ML POUR (IV SOLUTION) ×4 IMPLANT
WRAP KNEE MAXI GEL POST OP (GAUZE/BANDAGES/DRESSINGS) ×2 IMPLANT

## 2022-12-22 NOTE — Progress Notes (Signed)
Orthopedic Tech Progress Note Patient Details:  [name] April 11, 1950 [id number]  Ortho Devices Type of Ortho Device: Bone foam zero knee Ortho Device/Splint Interventions: Ordered     Patient not yet in recovery, left with RN in bay 9. Darleen Crocker 12/22/2022, 9:28 AM

## 2022-12-22 NOTE — Transfer of Care (Signed)
Immediate Anesthesia Transfer of Care Note  Patient: [name]  Procedure(s) Performed: LEFT TOTAL KNEE ARTHROPLASTY (Left: Knee)  Patient Location: PACU  Anesthesia Type:Spinal  Level of Consciousness: awake, alert , oriented, and patient cooperative  Airway & Oxygen Therapy: Patient Spontanous Breathing and Patient connected to face mask oxygen  Post-op Assessment: Report given to RN and Post -op Vital signs reviewed and stable  Post vital signs: Reviewed and stable  Last Vitals:  Vitals Value Taken Time  BP 108/63 12/22/22 1126  Temp    Pulse 83 12/22/22 1128  Resp 16 12/22/22 1128  SpO2 100 % 12/22/22 1128  Vitals shown include unfiled device data.  Last Pain:  Vitals:   12/22/22 0808  TempSrc:   PainSc: 0-No pain         Complications: No notable events documented.

## 2022-12-22 NOTE — Op Note (Signed)
PATIENT ID:      [name]  MRN:     [id number] DOB/AGE:    02/27/1950 / 72 y.o.       OPERATIVE REPORT   DATE OF PROCEDURE:  12/22/2022      PREOPERATIVE DIAGNOSIS:   LEFT KNEE OSTEOARTHRITIS VALGUS      Estimated body mass index is 33.99 kg/m as calculated from the following:   Height as of this encounter: 5\' 7"  (1.702 m).   Weight as of this encounter: 98.4 kg.                                                       POSTOPERATIVE DIAGNOSIS:   Same                                                                  PROCEDURE:  Procedure(s): LEFT TOTAL KNEE ARTHROPLASTY Using DepuyAttune RP implants #6 Femur, #6Tibia, 5 mm Attune RP bearing, 41 Patella    SURGEON: [name]  ASSISTANT:   Virgina Jock RNFA   (Present and scrubbed throughout the case, critical for assistance with exposure, retraction, instrumentation, and closure.)        ANESTHESIA: Spinal, 20cc Exparel, 50cc 0.25% Marcaine EBL: 300 cc FLUID REPLACEMENT: 1600 cc crystaloid TOURNIQUET: DRAINS: None TRANEXAMIC ACID: 1gm IV, 2gm topical COMPLICATIONS:  None         INDICATIONS FOR PROCEDURE: The patient has  LEFT KNEE OSTEOARTHRITIS VALGUS, Val deformities, XR shows bone on bone arthritis, lateral subluxation of tibia. Patient has failed all conservative measures including anti-inflammatory medicines, narcotics, attempts at exercise and weight loss, cortisone injections and viscosupplementation.  Risks and benefits of surgery have been discussed, questions answered.   DESCRIPTION OF PROCEDURE: The patient identified by armband, received  IV antibiotics, in the holding area at [facility]. Patient taken to the operating room, appropriate anesthetic monitors were attached, and Spinal anesthesia was  induced. IV Tranexamic acid was given. Lateral post and 2 surefoot positioners applied to the table, the lower extremity was then prepped and draped in usual sterile fashion from the toes to the high thigh. Time-out  procedure was performed. Leyla Simpson RNFA, was present and scrubbed throughout the case, critical for assistance with, positioning, exposure, retraction, instrumentation, and closure.The skin and subcutaneous tissue along the incision was injected with 20 cc of a mixture of 20cc Exparel and 30cc Marcaine 50cc saline solution, using a 21-gauge by 1-1/2 inch needle. We began the operation, with the knee flexed 130 degrees, by making the anterior midline incision starting at handbreadth above the patella going over the patella 1 cm medial to and 4 cm distal to the tibial tubercle. Small bleeders in the skin and the subcutaneous tissue identified and cauterized. Transverse retinaculum was incised and reflected medially and a medial parapatellar arthrotomy was accomplished. the patella was everted and theprepatellar fat pad resected. The superficial medial collateral ligament was then elevated from anterior to posterior along the proximal flare of the tibia and anterior half of the menisci resected. The knee was hyperflexed exposing bone on bone arthritis. Peripheral  and notch osteophytes as well as the cruciate ligaments were then resected. We continued to work our way around posteriorly along the proximal tibia, and externally rotated the tibia subluxing it out from underneath the femur. A McHale PCL retractor was placed through the notch, a lateral Hohmann retractor, and anterolateral small homan retractor placed. We then entered the proximal tibia with the Depuy starter drill in line with the axis of the tibia followed by an intramedullary guide rod and 3-degree posterior slope cutting guide. The tibial cutting guide, was pinned into place allowing resection of 7 mm of bone medially and 1 mm of bone laterally. Satisfied with the tibial resection, we then entered the distal femur 2 mm anterior to the PCL origin with the starter drill, followed by the intramedullary guide rod and applied the distal femoral cutting  guide set at 9 mm, with 5 degrees of valgus. This was pinned along the epicondylar axis. At this point, the distal femoral cut was accomplished without difficulty. We then sized for a #6L femoral component and pinned the chamfer guide in 0 degrees of external rotation. The anterior, posterior, and chamfer cuts were accomplished without difficulty followed by the Attune RP box cutting guide and the box cut. We also removed posterior osteophytes from the posterior femoral condyles. The posterior capsule was injected with Exparel solution. The knee was brought into full extension. We checked our extension gap and fit a 5 mm trial lollipop. Distracting in extension with a lamina spreader,  bleeders in the posterior capsule, Posterior medial and posterior lateral gutter were cauterized.  The transexamic acid-soaked sponge was then placed in the gap of the knee in extension. The knee was flexed 30. The posterior patella cut was accomplished with the 9.5 mm Attune cutting guide, sized for a 41 mm dome, and the fixation pegs drilled.The knee was then once again hyperflexed exposing the proximal tibia. We sized for a # 6 tibial base plate, applied the smokestack and the conical reamer followed by the the Delta fin keel punch. We then hammered into place the Attune RP trial femoral component, drilled the lugs, inserted a  5 mm trial bearing, trial patellar button, and took the knee through range of motion from 0-130 degrees. Medial and lateral ligamentous stability was checked. No thumb pressure was required for patellar Tracking.  All trial components were removed, mating surfaces irrigated with pulse lavage, and dried with suction and sponges. 10 cc of the Exparel solution was applied to the cancellus bone of the patella distal femur and proximal tibia.  After waiting 30 seconds, the bony surfaces were again, dried with sponges. A double batch of DePuy HV cement was mixed and applied to all bony metallic mating surfaces  except for the posterior condyles of the femur itself. In order, we hammered into place the tibial tray and removed excess cement, the femoral component and removed excess cement. The final Attune RP bearing was inserted, and the knee brought to full extension with compression. The patellar button was clamped into place, and excess cement removed. The knee was held at 30 flexion with compression using the second surefoot, while the cement cured. The wound was irrigated out with normal saline solution pulse lavage. The rest of the Exparel was injected into the parapatellar arthrotomy, subcutaneous tissues, and periosteal tissues. The parapatellar arthrotomy was closed with running #1 Vicryl suture. The subcutaneous tissue with 3-0 undyed Vicryl suture, and the skin with running 3-0 SQ vicryl. An Aquacil dressing and Ace wrap  were applied. The patient was taken to recovery room without difficulty.   [name] 12/22/2022, 6:59 AM

## 2022-12-22 NOTE — Discharge Instructions (Signed)

## 2022-12-22 NOTE — Anesthesia Procedure Notes (Signed)
Spinal  Patient location during procedure: OR Start time: 12/22/2022 9:11 AM End time: 12/22/2022 9:15 AM Reason for block: surgical anesthesia Staffing Performed: resident/CRNA  Resident/CRNA: Garth Bigness, CRNA Performed by: Garth Bigness, CRNA Authorized by: Marcene Duos, MD   Preanesthetic Checklist Completed: patient identified, IV checked, site marked, risks and benefits discussed, surgical consent, monitors and equipment checked, pre-op evaluation and timeout performed Spinal Block Patient position: sitting Prep: Betadine and DuraPrep Patient monitoring: heart rate, continuous pulse ox and blood pressure Approach: midline Location: L3-4 Injection technique: single-shot Needle Needle type: Pencan  Needle gauge: 24 G Needle length: 9 cm Needle insertion depth: 5 cm Assessment Sensory level: T10 Events: CSF return Additional Notes

## 2022-12-22 NOTE — Anesthesia Postprocedure Evaluation (Signed)
Anesthesia Post Note  Patient: [name]  Procedure(s) Performed: LEFT TOTAL KNEE ARTHROPLASTY (Left: Knee)     Patient location during evaluation: PACU Anesthesia Type: Spinal Level of consciousness: awake and alert Pain management: pain level controlled Vital Signs Assessment: post-procedure vital signs reviewed and stable Respiratory status: spontaneous breathing and respiratory function stable Cardiovascular status: blood pressure returned to baseline and stable Postop Assessment: spinal receding Anesthetic complications: no  No notable events documented.  Last Vitals:  Vitals:   12/22/22 1357 12/22/22 1400  BP: 112/70 107/78  Pulse: 81 85  Resp: 20   Temp: 36.6 C   SpO2: 98% 100%    Last Pain:  Vitals:   12/22/22 1357  TempSrc: Oral  PainSc: 0-No pain                 Kennieth Rad

## 2022-12-22 NOTE — Progress Notes (Signed)
PT Cancellation Note  Patient Details Name: HOLLYNN OKAFOR MRN: [id number] DOB: 1950-08-26   Cancelled Treatment:    Reason Eval/Treat Not Completed: Other (comment)  PT orders entered 930, anesthesia completed 1129, will f/u as able later today.  Anise Salvo, PT Acute Rehab [facility] Rehab [phone]  Rayetta Humphrey 12/22/2022, 12:14 PM

## 2022-12-22 NOTE — Interval H&P Note (Signed)
History and Physical Interval Note:  12/22/2022 6:59 AM  [name]  has presented today for surgery, with the diagnosis of LEFT KNEE OSTEOARTHRITIS VALGUS.  The various methods of treatment have been discussed with the patient and family. After consideration of risks, benefits and other options for treatment, the patient has consented to  Procedure(s): LEFT TOTAL KNEE ARTHROPLASTY (Left) as a surgical intervention.  The patient's history has been reviewed, patient examined, no change in status, stable for surgery.  I have reviewed the patient's chart and labs.  Questions were answered to the patient's satisfaction.     [name]

## 2022-12-22 NOTE — Anesthesia Procedure Notes (Signed)
Anesthesia Regional Block: Adductor canal block   Pre-Anesthetic Checklist: , timeout performed,  Correct Patient, Correct Site, Correct Laterality,  Correct Procedure, Correct Position, site marked,  Risks and benefits discussed,  Surgical consent,  Pre-op evaluation,  At surgeon's request and post-op pain management  Laterality: Left  Prep: chloraprep       Needles:  Injection technique: Single-shot  Needle Type: Echogenic Needle     Needle Length: 9cm  Needle Gauge: 21     Additional Needles:   Procedures:,,,, ultrasound used (permanent image in chart),,    Narrative:  Start time: 12/22/2022 8:05 AM End time: 12/22/2022 8:10 AM Injection made incrementally with aspirations every 5 mL.  Performed by: Personally  Anesthesiologist: Marcene Duos, MD

## 2022-12-22 NOTE — Evaluation (Signed)
Physical Therapy Evaluation Patient Details Name: [name] MRN: [id number] DOB: 1950-04-13 Today's Date: 12/22/2022  History of Present Illness  Pt is 72 yo female admitted on 12/22/22 for L TKA.  Pt with hx including but not limited to arthritis, DM, hyperglycemia, lumbar radiculopathy, afib, CAD, sleep apnea, cardiomyopathy, bil THA, R TKA  Clinical Impression  Pt is s/p TKA resulting in the deficits listed below (see PT Problem List). Pt is independent and lives alone at baseline; she has arranged for assist for a few days at d/c.  PT asked to see pt in PACU for possible SDDC.  She had excellent ROM, quad activation, and pain control.  Pt was able to ambulate 98' with RW and CGA for safety.  Demonstrated good balance.  Provided with written HEP and will have HHPT per MD note.   Pt demonstrates safe gait & transfers in order to return home from PT perspective once discharged by MD.  While in hospital, will continue to benefit from PT for skilled therapy to advance mobility and exercises.              If plan is discharge home, recommend the following: A little help with walking and/or transfers;A little help with bathing/dressing/bathroom;Assistance with cooking/housework;Help with stairs or ramp for entrance   Can travel by private vehicle        Equipment Recommendations Rolling walker (2 wheels)  Recommendations for Other Services       Functional Status Assessment Patient has had a recent decline in their functional status and demonstrates the ability to make significant improvements in function in a reasonable and predictable amount of time.     Precautions / Restrictions Precautions Precautions: Fall;Knee Restrictions Weight Bearing Restrictions: Yes LLE Weight Bearing: Weight bearing as tolerated      Mobility  Bed Mobility Overal bed mobility: Needs Assistance             General bed mobility comments: in chair    Transfers Overall transfer level:  Needs assistance Equipment used: Rolling walker (2 wheels) Transfers: Sit to/from Stand Sit to Stand: Contact guard assist           General transfer comment: CGA for safety; good hand placement; educated on L LE management; tolerated well    Ambulation/Gait Ambulation/Gait assistance: Contact guard assist Gait Distance (Feet): 80 Feet Assistive device: Rolling walker (2 wheels) Gait Pattern/deviations: Decreased weight shift to left, Decreased stride length Gait velocity: decreased but functional     General Gait Details: Started wtih step to progressed to step through; educated on resuming step to pattern if painful ; good RW proximity and stable  Stairs            Wheelchair Mobility     Tilt Bed    Modified Rankin (Stroke Patients Only)       Balance Overall balance assessment: Needs assistance Sitting-balance support: No upper extremity supported Sitting balance-Leahy Scale: Good     Standing balance support: Bilateral upper extremity supported, No upper extremity supported Standing balance-Leahy Scale: Fair Standing balance comment: RW to ambulate; could static stand without support                             Pertinent Vitals/Pain Pain Assessment Pain Assessment: No/denies pain    Home Living Family/patient expects to be discharged to:: Private residence Living Arrangements: Alone Available Help at Discharge: Family;Available 24 hours/day (children helping out for a few days;  aide 2 hr a day) Type of Home: Apartment Home Access: Level entry       Home Layout: One level Home Equipment: Shower seat;BSC/3in1;Cane - single point      Prior Function Prior Level of Function : Independent/Modified Independent;Driving             Mobility Comments: Could ambulate in community without AD ADLs Comments: independent adls and iadls     Extremity/Trunk Assessment   Upper Extremity Assessment Upper Extremity Assessment: Overall WFL  for tasks assessed    Lower Extremity Assessment Lower Extremity Assessment: LLE deficits/detail;RLE deficits/detail RLE Deficits / Details: ROM WFL; MMT 5/5 RLE Sensation: WNL LLE Deficits / Details: Expected post op changes; ROM: knee 5 to 90 degrees; MMT: ankle 5/5, knee and hip 3/5 not further tested; no knee ext lag LLE Sensation: WNL    Cervical / Trunk Assessment Cervical / Trunk Assessment: Normal  Communication      Cognition Arousal: Alert Behavior During Therapy: WFL for tasks assessed/performed Overall Cognitive Status: Within Functional Limits for tasks assessed                                          General Comments  Educated on safe ice use, no pivots, car transfers, resting with leg straight, and TED hose during day. Also, encouraged walking every 1-2 hours during day. Educated on HEP with focus on mobility the first weeks. Discussed doing exercises within pain control and if pain increasing could decreased ROM, reps, and stop exercises as needed. Encouraged to perform quad sets and ankle pumps frequently for blood flow and to promote full knee extension.     Exercises Total Joint Exercises Ankle Circles/Pumps: AROM, Both, 5 reps, Supine Quad Sets: AROM, Both, 5 reps, Supine Heel Slides: AAROM, Left, 5 reps, Supine Hip ABduction/ADduction: AAROM, Left, 5 reps, Supine Long Arc Quad: AROM, Left, 5 reps, Seated Knee Flexion: AROM, Left, 5 reps, Seated Goniometric ROM: L knee 5 to 90 degrees   Assessment/Plan    PT Assessment Patient needs continued PT services  PT Problem List Decreased strength;Decreased range of motion;Decreased activity tolerance;Decreased balance;Decreased mobility;Decreased knowledge of use of DME;Pain       PT Treatment Interventions DME instruction;Therapeutic exercise;Gait training;Balance training;Stair training;Functional mobility training;Therapeutic activities;Patient/family education;Modalities    PT Goals  (Current goals can be found in the Care Plan section)  Acute Rehab PT Goals Patient Stated Goal: return home PT Goal Formulation: With patient Time For Goal Achievement: 01/05/23 Potential to Achieve Goals: Good    Frequency 7X/week     Co-evaluation               AM-PAC PT "6 Clicks" Mobility  Outcome Measure Help needed turning from your back to your side while in a flat bed without using bedrails?: A Little Help needed moving from lying on your back to sitting on the side of a flat bed without using bedrails?: A Little Help needed moving to and from a bed to a chair (including a wheelchair)?: A Little Help needed standing up from a chair using your arms (e.g., wheelchair or bedside chair)?: A Little Help needed to walk in hospital room?: A Little Help needed climbing 3-5 steps with a railing? : A Little 6 Click Score: 18    End of Session Equipment Utilized During Treatment: Gait belt Activity Tolerance: Patient tolerated treatment well Patient left: with  call bell/phone within reach;in chair (in PACU) Nurse Communication: Mobility status PT Visit Diagnosis: Other abnormalities of gait and mobility (R26.89);Muscle weakness (generalized) (M62.81)    Time: 1541-1610 PT Time Calculation (min) (ACUTE ONLY): 29 min   Charges:   PT Evaluation $PT Eval Low Complexity: 1 Low PT Treatments $Gait Training: 8-22 mins PT General Charges $$ ACUTE PT VISIT: 1 Visit         Anise Salvo, PT Acute Rehab [facility] [phone]   Rayetta Humphrey 12/22/2022, 4:29 PM

## 2022-12-23 ENCOUNTER — Encounter (HOSPITAL_COMMUNITY): Payer: Self-pay | Admitting: Orthopedic Surgery

## 2022-12-23 ENCOUNTER — Other Ambulatory Visit: Payer: Self-pay

## 2022-12-23 ENCOUNTER — Emergency Department (HOSPITAL_COMMUNITY)
Admission: EM | Admit: 2022-12-23 | Discharge: 2022-12-23 | Disposition: A | Discharge status: Home / Self Care | Payer: 59 | Attending: Emergency Medicine | Admitting: Emergency Medicine

## 2022-12-23 DIAGNOSIS — G8918 Other acute postprocedural pain: Secondary | ICD-10-CM | POA: Insufficient documentation

## 2022-12-23 DIAGNOSIS — M79652 Pain in left thigh: Secondary | ICD-10-CM | POA: Insufficient documentation

## 2022-12-23 DIAGNOSIS — I2510 Atherosclerotic heart disease of native coronary artery without angina pectoris: Secondary | ICD-10-CM | POA: Insufficient documentation

## 2022-12-23 DIAGNOSIS — R600 Localized edema: Secondary | ICD-10-CM | POA: Diagnosis not present

## 2022-12-23 DIAGNOSIS — Z7901 Long term (current) use of anticoagulants: Secondary | ICD-10-CM | POA: Diagnosis not present

## 2022-12-23 DIAGNOSIS — I110 Hypertensive heart disease with heart failure: Secondary | ICD-10-CM | POA: Diagnosis not present

## 2022-12-23 DIAGNOSIS — I509 Heart failure, unspecified: Secondary | ICD-10-CM | POA: Diagnosis not present

## 2022-12-23 DIAGNOSIS — M25562 Pain in left knee: Secondary | ICD-10-CM | POA: Insufficient documentation

## 2022-12-23 MED ORDER — HYDROMORPHONE HCL 2 MG PO TABS: 2.0000 mg | ORAL_TABLET | ORAL | 0 refills | Status: AC | PRN

## 2022-12-23 MED ORDER — HYDROMORPHONE HCL 1 MG/ML IJ SOLN
1.0000 mg | Freq: Once | INTRAMUSCULAR | Status: AC
  Administered 2022-12-23: 1 mg via INTRAMUSCULAR
  Filled 2022-12-23: qty 1

## 2022-12-23 NOTE — Discharge Instructions (Addendum)
Thank you for letting us evaluate you today. We have given you a dilaudid a knee immobilizer, and crutches in the ED. We have looked at your dressing and does not appear to have significant bleeding. Your knee is not swollen. Please call your orthopedic surgeon tomorrow regarding an appointment for further evaluation. I have sent Dilaudid to [facility] that is 24 hours so you can pick up tonight. Do not take dilaudid and oxycodone together!  Return to ED if you experience red, hot, significantly swollen knee in indicate infection esp in setting of fever, pale leg to indicate loss of pulses, or pain not improved with

## 2022-12-23 NOTE — ED Triage Notes (Signed)
EMS reports from home, Pt had total knee replacement surgery on left knee yesterday. Pt states she has had some bleeding from surgical site starting this morning. Bleeding controlled currently.  BP 140/82 HR 84 RR 18 Sp02 98 RA Termp 97.2 CBG 183

## 2022-12-23 NOTE — ED Notes (Signed)
Brace applied by ortho, RN wrapped over pt dressing per pt request and PA approval

## 2022-12-23 NOTE — Progress Notes (Signed)
Orthopedic Tech Progress Note Patient Details:  [name] 1950/11/15 [id number]  Ortho Devices Type of Ortho Device: Knee Immobilizer Ortho Device/Splint Location: left knee immobilizer applied. crutches declined Ortho Device/Splint Interventions: Ordered, Application, Adjustment   Post Interventions Patient Tolerated: Well Instructions Provided: Adjustment of device, Care of device  Kizzie Fantasia 12/23/2022, 8:23 PM

## 2022-12-23 NOTE — ED Provider Notes (Signed)
[place] EMERGENCY DEPARTMENT AT [facility] Provider Note   CSN: [id number] Arrival date & time: 12/23/22  1707     History {Add pertinent medical, surgical, social history, OB history to HPI:1} Chief Complaint  Patient presents with   Post-op Problem    [name] is a 72 y.o. female with past medical history of CHF, CAD, HTN, paroxysmal A-fib (on Eliquis therapy), and [employer] device presents emergency department for evaluation of bleeding and left knee pain.  She is status post TKA yesterday. She complains of right knee pain, difficulty sleeping at night, and bleeding from knee. She requests to have knee wrapped so she cannot see blood. She denies fever  HPI     Home Medications Prior to Admission medications   Medication Sig Start Date End Date Taking? Authorizing Provider  apixaban (ELIQUIS) 2.5 MG TABS tablet Take 1 tablet (2.5 mg total) by mouth 2 (two) times daily. 12/22/22   Gean Birchwood, MD  gabapentin (NEURONTIN) 100 MG capsule Take 100 mg by mouth 3 (three) times daily. 03/10/18   [provider]  glucose blood (FREESTYLE TEST STRIPS) test strip Use as instructed 04/25/16   Ghimire, Werner Lean, MD  glucose monitoring kit (FREESTYLE) monitoring kit 1 each by Does not apply route 4 (four) times daily - after meals and at bedtime. 1 month Diabetic Testing Supplies for QAC-QHS accuchecks. 04/25/16   Ghimire, Werner Lean, MD  HUMALOG KWIKPEN 100 UNIT/ML KwikPen Inject 20 Units into the skin 2 (two) times daily. Sliding scale 02/12/18   [provider]  insulin glargine (LANTUS) 100 UNIT/ML Solostar Pen Inject 50 Units into the skin at bedtime.    [provider]  Insulin Pen Needle 32G X 8 MM MISC Use as directed 06/18/16   Little Ishikawa, NP  isosorbide-hydrALAZINE (BIDIL) 20-37.5 MG tablet Take 2 tablets by mouth 3 (three) times daily. 09/04/22   [provider]  Lancets (FREESTYLE) lancets Use as instructed 06/18/16   Little Ishikawa, NP  oxyCODONE (ROXICODONE) 5 MG immediate release tablet Take 1-2 tablets (5-10 mg total) by mouth every 4 (four) hours as needed for up to 7 days for severe pain (pain score 7-10). 12/22/22 12/29/22  Gean Birchwood, MD  Semaglutide,0.25 or 0.5MG /DOS, (OZEMPIC, 0.25 OR 0.5 MG/DOSE,) 2 MG/1.5ML SOPN Inject 0.5 mg into the skin every Wednesday.    [provider]  Tafamidis (VYNDAMAX) 61 MG CAPS TAKE 1 CAPSULE (61 MG) BY MOUTH DAILY. 05/09/22   Laurey Morale, MD  tiZANidine (ZANAFLEX) 2 MG tablet Take 1 tablet (2 mg total) by mouth 3 (three) times daily. 12/22/22   Gean Birchwood, MD  torsemide (DEMADEX) 100 MG tablet Take 150 mg by mouth daily. 10/16/22   [provider]      Allergies    Morphine and codeine and Penicillins    Review of Systems   Review of Systems  Constitutional:  Negative for chills, fatigue and fever.  Respiratory:  Negative for cough, chest tightness, shortness of breath and wheezing.   Cardiovascular:  Negative for chest pain and palpitations.  Gastrointestinal:  Negative for abdominal pain, constipation, diarrhea, nausea and vomiting.  Musculoskeletal:  Positive for arthralgias.  Neurological:  Negative for dizziness, seizures, weakness, light-headedness, numbness and headaches.    Physical Exam Updated Vital Signs BP (!) 155/72   Pulse 88   Temp 98.1 F (36.7 C) (Oral)   Resp 18   SpO2 96%  Physical Exam Vitals and nursing note reviewed.  Constitutional:      General: She is not in acute distress.    Appearance: Normal appearance.  HENT:     Head: Normocephalic and atraumatic.  Eyes:     Conjunctiva/sclera: Conjunctivae normal.  Cardiovascular:     Rate and Rhythm: Normal rate.  Pulmonary:     Effort: Pulmonary effort is normal. No respiratory distress.     Breath sounds: Normal breath sounds.  Abdominal:     General: Bowel sounds are normal. There is no distension.     Palpations: Abdomen is soft.     Tenderness: There is no  abdominal tenderness. There is no guarding or rebound.  Skin:    Capillary Refill: Capillary refill takes less than 2 seconds.     Coloration: Skin is not jaundiced or pale.  Neurological:     Mental Status: She is alert and oriented to person, place, and time. Mental status is at baseline.    ED Results / Procedures / Treatments   Labs (all labs ordered are listed, but only abnormal results are displayed) Labs Reviewed - No data to display  EKG None  Radiology No results found.  Procedures Procedures  {Document cardiac monitor, telemetry assessment procedure when appropriate:1}  Medications Ordered in ED Medications  HYDROmorphone (DILAUDID) injection 1 mg (1 mg Intramuscular Given 12/23/22 1808)    ED Course/ Medical Decision Making/ A&P   {   Click here for ABCD2, HEART and other calculatorsREFRESH Note before signing :1}                              Medical Decision Making Risk Prescription drug management.   ***  {Document critical care time when appropriate:1} {Document review of labs and clinical decision tools ie heart score, Chads2Vasc2 etc:1}  {Document your independent review of radiology images, and any outside records:1} {Document your discussion with family members, caretakers, and with consultants:1} {Document social determinants of health affecting pt's care:1} {Document your decision making why or why not admission, treatments were needed:1} Final Clinical Impression(s) / ED Diagnoses Final diagnoses:  None    Rx / DC Orders ED Discharge Orders     None

## 2022-12-29 ENCOUNTER — Other Ambulatory Visit (HOSPITAL_COMMUNITY): Payer: Self-pay

## 2022-12-31 ENCOUNTER — Ambulatory Visit: Payer: 59

## 2022-12-31 ENCOUNTER — Other Ambulatory Visit (HOSPITAL_COMMUNITY): Payer: Self-pay

## 2022-12-31 DIAGNOSIS — I428 Other cardiomyopathies: Secondary | ICD-10-CM

## 2022-12-31 LAB — CUP PACEART REMOTE DEVICE CHECK
Battery Remaining Longevity: 63 mo
Battery Voltage: 2.98 V
Brady Statistic AP VP Percent: 0.05 %
Brady Statistic AP VS Percent: 0.01 %
Brady Statistic AS VP Percent: 98.45 %
Brady Statistic AS VS Percent: 1.5 %
Brady Statistic RA Percent Paced: 0.05 %
Brady Statistic RV Percent Paced: 35.28 %
Date Time Interrogation Session: 20241204033323
HighPow Impedance: 59 Ohm
Implantable Lead Connection Status: 753985
Implantable Lead Connection Status: 753985
Implantable Lead Connection Status: 753985
Implantable Lead Implant Date: 20141023
Implantable Lead Implant Date: 20141023
Implantable Lead Implant Date: 20141023
Implantable Lead Location: 753858
Implantable Lead Location: 753859
Implantable Lead Location: 753860
Implantable Lead Model: 4298
Implantable Lead Model: 5076
Implantable Lead Model: 6935
Implantable Pulse Generator Implant Date: 20221027
Lead Channel Impedance Value: 152 Ohm
Lead Channel Impedance Value: 160.941
Lead Channel Impedance Value: 160.941
Lead Channel Impedance Value: 165.029
Lead Channel Impedance Value: 165.029
Lead Channel Impedance Value: 304 Ohm
Lead Channel Impedance Value: 304 Ohm
Lead Channel Impedance Value: 342 Ohm
Lead Channel Impedance Value: 342 Ohm
Lead Channel Impedance Value: 361 Ohm
Lead Channel Impedance Value: 399 Ohm
Lead Channel Impedance Value: 456 Ohm
Lead Channel Impedance Value: 456 Ohm
Lead Channel Impedance Value: 532 Ohm
Lead Channel Impedance Value: 551 Ohm
Lead Channel Impedance Value: 551 Ohm
Lead Channel Impedance Value: 551 Ohm
Lead Channel Impedance Value: 589 Ohm
Lead Channel Pacing Threshold Amplitude: 0.625 V
Lead Channel Pacing Threshold Amplitude: 0.75 V
Lead Channel Pacing Threshold Amplitude: 1.375 V
Lead Channel Pacing Threshold Pulse Width: 0.4 ms
Lead Channel Pacing Threshold Pulse Width: 0.4 ms
Lead Channel Pacing Threshold Pulse Width: 0.4 ms
Lead Channel Sensing Intrinsic Amplitude: 15.125 mV
Lead Channel Sensing Intrinsic Amplitude: 15.125 mV
Lead Channel Sensing Intrinsic Amplitude: 2.875 mV
Lead Channel Sensing Intrinsic Amplitude: 2.875 mV
Lead Channel Setting Pacing Amplitude: 1.75 V
Lead Channel Setting Pacing Amplitude: 2 V
Lead Channel Setting Pacing Amplitude: 2.5 V
Lead Channel Setting Pacing Pulse Width: 0.4 ms
Lead Channel Setting Pacing Pulse Width: 0.4 ms
Lead Channel Setting Sensing Sensitivity: 0.45 mV
Zone Setting Status: 755011
Zone Setting Status: 755011

## 2023-01-05 ENCOUNTER — Other Ambulatory Visit: Payer: Self-pay

## 2023-01-05 NOTE — Progress Notes (Signed)
Specialty Pharmacy Refill Coordination Note  [name] is a 73 y.o. female contacted today regarding refills of specialty medication(s) Tafamidis   Patient requested Delivery   Delivery date: 01/12/23   Verified address: [id number]   [name] [phone]   Medication will be filled on 01/09/23.

## 2023-01-07 ENCOUNTER — Ambulatory Visit: Payer: 59 | Attending: Orthopedic Surgery

## 2023-01-07 ENCOUNTER — Other Ambulatory Visit: Payer: Self-pay

## 2023-01-07 DIAGNOSIS — M25562 Pain in left knee: Secondary | ICD-10-CM | POA: Insufficient documentation

## 2023-01-07 DIAGNOSIS — R262 Difficulty in walking, not elsewhere classified: Secondary | ICD-10-CM | POA: Diagnosis present

## 2023-01-07 DIAGNOSIS — M6281 Muscle weakness (generalized): Secondary | ICD-10-CM | POA: Insufficient documentation

## 2023-01-07 NOTE — Therapy (Signed)
OUTPATIENT PHYSICAL THERAPY LOWER EXTREMITY EVALUATION   Patient Name: LENNY KINGRY MRN: [id number] DOB:1950-08-10, 72 y.o., female Today's Date: 01/07/2023  END OF SESSION:  PT End of Session - 01/07/23 1152     Visit Number 1    Number of Visits 10    Date for PT Re-Evaluation 03/04/23    Authorization Type UHC Medicare/medicaid    PT Start Time 1145    PT Stop Time 1230    PT Time Calculation (min) 45 min    Equipment Utilized During Treatment Gait belt    Activity Tolerance Patient tolerated treatment well    Behavior During Therapy WFL for tasks assessed/performed             Past Medical History:  Diagnosis Date   Anemia    a. Noted on 07/2012 labs, instructed to f/u PCP.   Arthritis    "joints" (11/18/2012)   CAD (coronary artery disease), native coronary artery    a. Nonobstructive by cath 02/2012 (done because of low EF).   Chronic bronchitis (HCC)    "~ every other year" (11/18/2012)   Chronic combined systolic and diastolic CHF (congestive heart failure) (HCC)    a. 03/05/12 echo:  LVEF 20-25%, moderate LVH , inferior and basal to mid septal akinesis, anterior moderate to severe hypokinesis and grade 2 diastolic dysfunction. b. EF 07/2012: EF still 25% (unclear medication compliance).   Chronic lower back pain    Headache(784.0)    "often; maybe not daily" (11/18/2012)   High cholesterol    History of noncompliance with medical treatment    Hypertension    LBBB (left bundle branch block)    Orthopnea    Pneumonia    Tobacco abuse    Type II diabetes mellitus (HCC)    Past Surgical History:  Procedure Laterality Date   BI-VENTRICULAR IMPLANTABLE CARDIOVERTER DEFIBRILLATOR N/A 11/18/2012   Procedure: BI-VENTRICULAR IMPLANTABLE CARDIOVERTER DEFIBRILLATOR  (CRT-D);  Surgeon: Marinus Maw, MD;  Location: [facility] CATH LAB;  Service: Cardiovascular;  Laterality: N/A;   BI-VENTRICULAR IMPLANTABLE CARDIOVERTER DEFIBRILLATOR  (CRT-D)  11/18/2012   BIV ICD GENERATOR  CHANGEOUT N/A 11/22/2020   Procedure: BIV ICD GENERATOR CHANGEOUT;  Surgeon: Marinus Maw, MD;  Location: [facility] INVASIVE CV LAB;  Service: Cardiovascular;  Laterality: N/A;   CARDIAC CATHETERIZATION  03/04/12   nonobstructive CAD, elevated LVEDP and tortuous vessels suggestive of long-standing hypertension   COLONOSCOPY WITH PROPOFOL N/A 12/12/2016   Procedure: COLONOSCOPY WITH PROPOFOL;  Surgeon: Jeani Hawking, MD;  Location: [facility] ENDOSCOPY;  Service: Endoscopy;  Laterality: N/A;   JOINT REPLACEMENT     Bilateral hip and right knee   LEFT HEART CATH N/A 03/05/2012   Procedure: LEFT HEART CATH;  Surgeon: Laurey Morale, MD;  Location: [facility] CATH LAB;  Service: Cardiovascular;  Laterality: N/A;   TOTAL KNEE ARTHROPLASTY Left 12/22/2022   Procedure: LEFT TOTAL KNEE ARTHROPLASTY;  Surgeon: Gean Birchwood, MD;  Location: [facility] ORS;  Service: Orthopedics;  Laterality: Left;   Patient Active Problem List   Diagnosis Date Noted   Degenerative arthritis of left knee 12/17/2022   Right foot infection 10/22/2017   Cellulitis in diabetic foot (HCC) 10/22/2017   Hyperglycemia 10/22/2017   Lumbar radiculopathy 09/18/2017   Degenerative spondylolisthesis 09/18/2017   Atrial fibrillation (HCC) 02/11/2017   Paroxysmal A-fib (HCC)    Biventricular automatic implantable cardioverter defibrillator in situ    Sepsis (HCC) 04/20/2016   UTI (urinary tract infection) 04/20/2016   Dyspnea 07/19/2015   Interstitial lung  disease (HCC) 11/20/2014   SIRS (systemic inflammatory response syndrome) (HCC) 02/03/2014   IDDM (insulin dependent diabetes mellitus) 02/03/2014   Tachycardia 06/14/2013   Chronic systolic CHF (congestive heart failure) (HCC) 09/29/2012   Hypoxia 03/06/2012   Hypertensive heart disease 03/06/2012   Nonischemic cardiomyopathy (HCC) 03/06/2012   Tobacco abuse 03/06/2012   Type 2 diabetes mellitus (HCC) 03/06/2012   Hypertension 03/06/2012   CAD (coronary artery disease), native coronary artery  03/06/2012   Hypokalemia 03/06/2012   Acute bronchitis 02/01/2009   Sleep apnea 02/01/2009   Chest pain 02/01/2009    PCP: Dr. Corliss Blacker  REFERRING PROVIDER: Gean Birchwood, MD  REFERRING DIAG: [phone] (ICD-10-CM) - Unilateral primary osteoarthritis, left knee  THERAPY DIAG:  Muscle weakness (generalized)  Difficulty in walking, not elsewhere classified  Acute pain of left knee  Rationale for Evaluation and Treatment: Rehabilitation  ONSET DATE: 11/26/2022- date of referral   SUBJECTIVE:   SUBJECTIVE STATEMENT: Pt had L TKA done on 12/22/22. Here for post op OP PT. R BKA was done in 2010, bil hips were done in 08/09.   PERTINENT HISTORY: Pt with hx including but not limited to arthritis, DM, hyperglycemia, lumbar radiculopathy, afib, CAD, sleep apnea, cardiomyopathy, bil THA, R TKA  PAIN:  Are you having pain? Yes: NPRS scale: 7-8/10 Pain location: L knee Pain description: L anterior knee Aggravating factors: bend, standing, walking Relieving factors: rest  PRECAUTIONS: ICD/Pacemaker  RED FLAGS: None   WEIGHT BEARING RESTRICTIONS: No  FALLS:  Has patient fallen in last 6 months? No  LIVING ENVIRONMENT: Lives with: lives alone Lives in: House/apartment Stairs: No Has following equipment at home: Single point cane, Environmental consultant - 2 wheeled, and shower chair  OCCUPATION: Retired  PLOF: Independent  PATIENT GOALS: "improve pain, get back to walking without anything"  NEXT MD VISIT: 02/07/22  OBJECTIVE:  Note: Objective measures were completed at Evaluation unless otherwise noted.  PATIENT SURVEYS:  LEFS 42/80*100 = 52.5%  COGNITION: Overall cognitive status: Within functional limits for tasks assessed     S LOWER EXTREMITY ROM:  Active ROM Right eval Left eval  Hip flexion    Hip extension    Hip abduction    Hip adduction    Hip internal rotation    Hip external rotation    Knee flexion 123 100  Knee extension 0 Lacks 15 deg  Ankle  dorsiflexion    Ankle plantarflexion    Ankle inversion    Ankle eversion     (Blank rows = not tested)  LOWER EXTREMITY MMT:  MMT Right eval Left eval  Hip flexion    Hip extension    Hip abduction    Hip adduction    Hip internal rotation    Hip external rotation    Knee flexion    Knee extension    Ankle dorsiflexion    Ankle plantarflexion    Ankle inversion    Ankle eversion     (Blank rows = not tested)    FUNCTIONAL TESTS:  5 times sit to stand: 18 sec with use of bil UE, decrease WB tolerance noted on L LE 10 meter walk test: 0.67 m/s with walker    TODAY'S TREATMENT:  DATE:   Supine quad set with towel roll under heel: 10 x  Supine heel slides: 10x Supine SAQ with blue bolster (8"): 10 x 10 sec holds Reviewed HEP from hospital (pt is doing LAQ, seated heel slides, standing hip flexion, abduction, extensions at home)  Adjusted patient's walker for appropriate heights and gave tennis balls for the walker  Pt educated on ice and elevation for swelling.  Pt educated on importance of getting knee to 0 deg extension to assist with walking better. Discussed goals with patient.  PATIENT EDUCATION:  Education details: see above Person educated: Patient Education method: Explanation Education comprehension: verbalized understanding  HOME EXERCISE PROGRAM: Access Code: [email or web address] URL: [email or web address] Date: 01/07/2023 Prepared by: Lavone Nian  Exercises - Supine Heel Slide  - 3 x daily - 7 x weekly - 10 reps - 5 sec hold - Long Sitting Quad Set with Towel Roll Under Heel  - 3 x daily - 7 x weekly - 2 sets - 10 reps - Sit to Stand with Armchair  - 1 x daily - 7 x weekly - 10 reps  ASSESSMENT:  CLINICAL IMPRESSION: Patient is a 72 y.o. female who was seen today for physical therapy evaluation and treatment for gait  and mobility taning s/p L TKA on 12/22/22. Patient currently demonstrates decreased L knee ROM, decreased strength, decreased functional strength, decreased gait speed, and gait/balance impairments. Patient will benefit from skilled PT to address these impairments and improve patient's overall function to work towards long term goals of therapy..   OBJECTIVE IMPAIRMENTS: Abnormal gait, decreased activity tolerance, decreased balance, decreased endurance, decreased mobility, difficulty walking, decreased ROM, decreased strength, hypomobility, increased edema, increased muscle spasms, impaired flexibility, and pain.   ACTIVITY LIMITATIONS: carrying, lifting, bending, standing, squatting, stairs, and transfers  PARTICIPATION LIMITATIONS: meal prep, cleaning, laundry, driving, shopping, community activity, and yard work  PERSONAL FACTORS: Time since onset of injury/illness/exacerbation are also affecting patient's functional outcome.   REHAB POTENTIAL: Excellent  CLINICAL DECISION MAKING: Stable/uncomplicated  EVALUATION COMPLEXITY: Low   GOALS: Goals reviewed with patient? Yes  SHORT TERM GOALS: Target date: 02/04/2023   Pt will demo -5 to 110 deg of L knee AROM to improve knee ROM with functional transfers, bed mobility and transfers Baseline: -15 to 100 deg Goal status: INITIAL  2.  PT will demo at least 50% compliance with HEP to self manage symptoms Baseline: issued on 01/07/23 Goal status: INITIAL  3.   LONG TERM GOALS: Target date: 03/04/2023    Patient will demo 0-120 deg of L knee AROM to improve squatting, functional transfers and car transfers Baseline: -15 to 100 deg  Goal status: INITIAL  2.  Pt will demo gait speed of >0.80 m/s without AD to improve gait quality, improve community ambulation. Baseline: 0.67 m/s with RW (01/07/23) Goal status: INITIAL  3.  Pt will report at least 9 points improvement on LEFS to improve overall function. Baseline: 42/80  (01/07/23) Goal status: INITIAL  4.  Pt will be able to perform 5x sit to stand without use of bil UE to improve functional strength with transfers in under 18 seconds Baseline: 18 sec WITH use of bil UE (01/07/23) Goal status: INITIAL  5.  Pt will report of pain of <3/10 at worst in L knee with daily functioning. Baseline: 7/10 (01/07/23) Goal status: INITIAL     PLAN:  PT FREQUENCY: 2x/week  PT DURATION: 8 weeks  PLANNED INTERVENTIONS: 97164- PT Re-evaluation, 97110-Therapeutic exercises, 97530- Therapeutic activity, [id number]- Neuromuscular  re-education, [id number]- Self Care, [id number]- Manual therapy, [phone]- Gait training, Patient/Family education, Balance training, Stair training, Taping, Dry Needling, Joint mobilization, Scar mobilization, Cryotherapy, Moist heat, and Manual therapy  PLAN FOR NEXT SESSION: Print out HEP (forgot to print); progress HEP as tolerated, initiate fwd/bwd walking at countertop with one UE support   Ileana Ladd, PT 01/07/2023, 1:38 PM

## 2023-01-12 ENCOUNTER — Ambulatory Visit: Payer: 59 | Admitting: Physical Therapy

## 2023-01-14 ENCOUNTER — Ambulatory Visit: Payer: 59

## 2023-01-14 DIAGNOSIS — M6281 Muscle weakness (generalized): Secondary | ICD-10-CM | POA: Diagnosis not present

## 2023-01-14 DIAGNOSIS — R262 Difficulty in walking, not elsewhere classified: Secondary | ICD-10-CM

## 2023-01-14 DIAGNOSIS — M25562 Pain in left knee: Secondary | ICD-10-CM

## 2023-01-14 NOTE — Therapy (Signed)
OUTPATIENT PHYSICAL THERAPY LOWER EXTREMITY EVALUATION   Patient Name: [name] MRN: [id number] DOB:1951-01-25, 72 y.o., female Today's Date: 01/14/2023  END OF SESSION:  PT End of Session - 01/14/23 1409     Visit Number 2    Number of Visits 10    Date for PT Re-Evaluation 03/04/23    Authorization Type UHC Medicare/medicaid    PT Start Time 1405    PT Stop Time 1445    PT Time Calculation (min) 40 min    Equipment Utilized During Treatment Gait belt    Activity Tolerance Patient tolerated treatment well    Behavior During Therapy WFL for tasks assessed/performed             Past Medical History:  Diagnosis Date   Anemia    a. Noted on 07/2012 labs, instructed to f/u PCP.   Arthritis    "joints" (11/18/2012)   CAD (coronary artery disease), native coronary artery    a. Nonobstructive by cath 02/2012 (done because of low EF).   Chronic bronchitis (HCC)    "~ every other year" (11/18/2012)   Chronic combined systolic and diastolic CHF (congestive heart failure) (HCC)    a. 03/05/12 echo:  LVEF 20-25%, moderate LVH , inferior and basal to mid septal akinesis, anterior moderate to severe hypokinesis and grade 2 diastolic dysfunction. b. EF 07/2012: EF still 25% (unclear medication compliance).   Chronic lower back pain    Headache(784.0)    "often; maybe not daily" (11/18/2012)   High cholesterol    History of noncompliance with medical treatment    Hypertension    LBBB (left bundle branch block)    Orthopnea    Pneumonia    Tobacco abuse    Type II diabetes mellitus (HCC)    Past Surgical History:  Procedure Laterality Date   BI-VENTRICULAR IMPLANTABLE CARDIOVERTER DEFIBRILLATOR N/A 11/18/2012   Procedure: BI-VENTRICULAR IMPLANTABLE CARDIOVERTER DEFIBRILLATOR  (CRT-D);  Surgeon: Marinus Maw, MD;  Location: [facility] CATH LAB;  Service: Cardiovascular;  Laterality: N/A;   BI-VENTRICULAR IMPLANTABLE CARDIOVERTER DEFIBRILLATOR  (CRT-D)  11/18/2012   BIV ICD GENERATOR  CHANGEOUT N/A 11/22/2020   Procedure: BIV ICD GENERATOR CHANGEOUT;  Surgeon: Marinus Maw, MD;  Location: [facility] INVASIVE CV LAB;  Service: Cardiovascular;  Laterality: N/A;   CARDIAC CATHETERIZATION  03/04/12   nonobstructive CAD, elevated LVEDP and tortuous vessels suggestive of long-standing hypertension   COLONOSCOPY WITH PROPOFOL N/A 12/12/2016   Procedure: COLONOSCOPY WITH PROPOFOL;  Surgeon: Jeani Hawking, MD;  Location: [facility] ENDOSCOPY;  Service: Endoscopy;  Laterality: N/A;   JOINT REPLACEMENT     Bilateral hip and right knee   LEFT HEART CATH N/A 03/05/2012   Procedure: LEFT HEART CATH;  Surgeon: Laurey Morale, MD;  Location: [facility] CATH LAB;  Service: Cardiovascular;  Laterality: N/A;   TOTAL KNEE ARTHROPLASTY Left 12/22/2022   Procedure: LEFT TOTAL KNEE ARTHROPLASTY;  Surgeon: Gean Birchwood, MD;  Location: [facility] ORS;  Service: Orthopedics;  Laterality: Left;   Patient Active Problem List   Diagnosis Date Noted   Degenerative arthritis of left knee 12/17/2022   Right foot infection 10/22/2017   Cellulitis in diabetic foot (HCC) 10/22/2017   Hyperglycemia 10/22/2017   Lumbar radiculopathy 09/18/2017   Degenerative spondylolisthesis 09/18/2017   Atrial fibrillation (HCC) 02/11/2017   Paroxysmal A-fib (HCC)    Biventricular automatic implantable cardioverter defibrillator in situ    Sepsis (HCC) 04/20/2016   UTI (urinary tract infection) 04/20/2016   Dyspnea 07/19/2015   Interstitial lung  disease (HCC) 11/20/2014   SIRS (systemic inflammatory response syndrome) (HCC) 02/03/2014   IDDM (insulin dependent diabetes mellitus) 02/03/2014   Tachycardia 06/14/2013   Chronic systolic CHF (congestive heart failure) (HCC) 09/29/2012   Hypoxia 03/06/2012   Hypertensive heart disease 03/06/2012   Nonischemic cardiomyopathy (HCC) 03/06/2012   Tobacco abuse 03/06/2012   Type 2 diabetes mellitus (HCC) 03/06/2012   Hypertension 03/06/2012   CAD (coronary artery disease), native coronary artery  03/06/2012   Hypokalemia 03/06/2012   Acute bronchitis 02/01/2009   Sleep apnea 02/01/2009   Chest pain 02/01/2009    PCP: Dr. Corliss Blacker  REFERRING PROVIDER: Gean Birchwood, MD  REFERRING DIAG: [phone] (ICD-10-CM) - Unilateral primary osteoarthritis, left knee  THERAPY DIAG:  Muscle weakness (generalized)  Difficulty in walking, not elsewhere classified  Acute pain of left knee  Rationale for Evaluation and Treatment: Rehabilitation  ONSET DATE: 11/26/2022- date of referral   SUBJECTIVE:   SUBJECTIVE STATEMENT: Pt reports I have diarrhea for couple of days. Pain is about 7-8/10  PERTINENT HISTORY: Pt with hx including but not limited to arthritis, DM, hyperglycemia, lumbar radiculopathy, afib, CAD, sleep apnea, cardiomyopathy, bil THA, R TKA  PAIN:  Are you having pain? Yes: NPRS scale: 7-8/10 Pain location: L knee Pain description: L anterior knee Aggravating factors: bend, standing, walking Relieving factors: rest  PRECAUTIONS: ICD/Pacemaker  RED FLAGS: None   WEIGHT BEARING RESTRICTIONS: No  FALLS:  Has patient fallen in last 6 months? No  LIVING ENVIRONMENT: Lives with: lives alone Lives in: House/apartment Stairs: No Has following equipment at home: Single point cane, Environmental consultant - 2 wheeled, and shower chair  OCCUPATION: Retired  PLOF: Independent  PATIENT GOALS: "improve pain, get back to walking without anything"  NEXT MD VISIT: 02/07/22  OBJECTIVE:  Note: Objective measures were completed at Evaluation unless otherwise noted.  PATIENT SURVEYS:  LEFS 42/80*100 = 52.5%  COGNITION: Overall cognitive status: Within functional limits for tasks assessed     S LOWER EXTREMITY ROM:  Active ROM Right eval Left eval Left 01/14/23  Hip flexion     Hip extension     Hip abduction     Hip adduction     Hip internal rotation     Hip external rotation     Knee flexion 123 100 108  Knee extension 0 Lacks 15 deg Lacks 10 deg  Ankle  dorsiflexion     Ankle plantarflexion     Ankle inversion     Ankle eversion      (Blank rows = not tested)  LOWER EXTREMITY MMT:  MMT Right eval Left eval  Hip flexion    Hip extension    Hip abduction    Hip adduction    Hip internal rotation    Hip external rotation    Knee flexion    Knee extension    Ankle dorsiflexion    Ankle plantarflexion    Ankle inversion    Ankle eversion     (Blank rows = not tested)    FUNCTIONAL TESTS:  5 times sit to stand: 18 sec with use of bil UE, decrease WB tolerance noted on L LE 10 meter walk test: 0.67 m/s with walker    TODAY'S TREATMENT:  DATE:   Pt educated to contact her PCP if diarrhea is not getting better.  Supine quad set: 20x 5" holds Supine SAQ: 2lbs 3 x 10 L only Supine SLR: AA: 2 x 10 L only SL clamshells: 2 x 10 L Only SL hip abduction: 2 x 10 L only, AA SciFit: level 1 for 10' Tandem walk in parallel bar with bil UE holds: Fwd and bwd: 3 x 10 feet Fwd step up: 6" box: 10x bil UE hold Lateral step up: 4" box: 10x bil UE hold Calf stretch: level 1 2 x 60"  PATIENT EDUCATION:  Education details: see above Person educated: Patient Education method: Explanation Education comprehension: verbalized understanding  HOME EXERCISE PROGRAM: Access Code: [email or web address] URL: [email or web address] Date: 01/07/2023 Prepared by: Lavone Nian  Exercises - Supine Heel Slide  - 3 x daily - 7 x weekly - 10 reps - 5 sec hold - Long Sitting Quad Set with Towel Roll Under Heel  - 3 x daily - 7 x weekly - 2 sets - 10 reps - Sit to Stand with Armchair  - 1 x daily - 7 x weekly - 10 reps  ASSESSMENT:  CLINICAL IMPRESSION: Pt demo. Improved knee ROM compared to evaluation. Pt reported pain improvement at end of the sesion to 6/10.  OBJECTIVE IMPAIRMENTS: Abnormal gait, decreased activity  tolerance, decreased balance, decreased endurance, decreased mobility, difficulty walking, decreased ROM, decreased strength, hypomobility, increased edema, increased muscle spasms, impaired flexibility, and pain.   ACTIVITY LIMITATIONS: carrying, lifting, bending, standing, squatting, stairs, and transfers  PARTICIPATION LIMITATIONS: meal prep, cleaning, laundry, driving, shopping, community activity, and yard work  PERSONAL FACTORS: Time since onset of injury/illness/exacerbation are also affecting patient's functional outcome.   REHAB POTENTIAL: Excellent  CLINICAL DECISION MAKING: Stable/uncomplicated  EVALUATION COMPLEXITY: Low   GOALS: Goals reviewed with patient? Yes  SHORT TERM GOALS: Target date: 02/04/2023   Pt will demo -5 to 110 deg of L knee AROM to improve knee ROM with functional transfers, bed mobility and transfers Baseline: -15 to 100 deg Goal status: INITIAL  2.  PT will demo at least 50% compliance with HEP to self manage symptoms Baseline: issued on 01/07/23 Goal status: INITIAL  3.   LONG TERM GOALS: Target date: 03/04/2023    Patient will demo 0-120 deg of L knee AROM to improve squatting, functional transfers and car transfers Baseline: -15 to 100 deg  Goal status: INITIAL  2.  Pt will demo gait speed of >0.80 m/s without AD to improve gait quality, improve community ambulation. Baseline: 0.67 m/s with RW (01/07/23) Goal status: INITIAL  3.  Pt will report at least 9 points improvement on LEFS to improve overall function. Baseline: 42/80 (01/07/23) Goal status: INITIAL  4.  Pt will be able to perform 5x sit to stand without use of bil UE to improve functional strength with transfers in under 18 seconds Baseline: 18 sec WITH use of bil UE (01/07/23) Goal status: INITIAL  5.  Pt will report of pain of <3/10 at worst in L knee with daily functioning. Baseline: 7/10 (01/07/23) Goal status: INITIAL     PLAN:  PT FREQUENCY: 2x/week  PT  DURATION: 8 weeks  PLANNED INTERVENTIONS: 97164- PT Re-evaluation, 97110-Therapeutic exercises, 97530- Therapeutic activity, 97112- Neuromuscular re-education, 97535- Self Care, 78469- Manual therapy, [phone]- Gait training, Patient/Family education, Balance training, Stair training, Taping, Dry Needling, Joint mobilization, Scar mobilization, Cryotherapy, Moist heat, and Manual therapy  PLAN FOR NEXT SESSION:progress HEP as tolerated, initiate fwd/bwd walking  at countertop with one UE support   Ileana Ladd, PT 01/14/2023, 2:10 PM

## 2023-01-23 ENCOUNTER — Ambulatory Visit: Payer: 59

## 2023-01-23 DIAGNOSIS — M6281 Muscle weakness (generalized): Secondary | ICD-10-CM

## 2023-01-23 DIAGNOSIS — M25562 Pain in left knee: Secondary | ICD-10-CM

## 2023-01-23 DIAGNOSIS — R262 Difficulty in walking, not elsewhere classified: Secondary | ICD-10-CM

## 2023-01-23 NOTE — Therapy (Signed)
OUTPATIENT PHYSICAL THERAPY LOWER EXTREMITY EVALUATION   Patient Name: [name] MRN: [id number] DOB:March 18, 1950, 72 y.o., female Today's Date: 01/23/2023  END OF SESSION:  PT End of Session - 01/23/23 1221     Visit Number 3    Number of Visits 10    Date for PT Re-Evaluation 03/04/23    Authorization Type UHC Medicare/medicaid    PT Start Time 1145    PT Stop Time 1230    PT Time Calculation (min) 45 min    Equipment Utilized During Treatment Gait belt    Activity Tolerance Patient tolerated treatment well    Behavior During Therapy WFL for tasks assessed/performed              Past Medical History:  Diagnosis Date   Anemia    a. Noted on 07/2012 labs, instructed to f/u PCP.   Arthritis    "joints" (11/18/2012)   CAD (coronary artery disease), native coronary artery    a. Nonobstructive by cath 02/2012 (done because of low EF).   Chronic bronchitis (HCC)    "~ every other year" (11/18/2012)   Chronic combined systolic and diastolic CHF (congestive heart failure) (HCC)    a. 03/05/12 echo:  LVEF 20-25%, moderate LVH , inferior and basal to mid septal akinesis, anterior moderate to severe hypokinesis and grade 2 diastolic dysfunction. b. EF 07/2012: EF still 25% (unclear medication compliance).   Chronic lower back pain    Headache(784.0)    "often; maybe not daily" (11/18/2012)   High cholesterol    History of noncompliance with medical treatment    Hypertension    LBBB (left bundle branch block)    Orthopnea    Pneumonia    Tobacco abuse    Type II diabetes mellitus (HCC)    Past Surgical History:  Procedure Laterality Date   BI-VENTRICULAR IMPLANTABLE CARDIOVERTER DEFIBRILLATOR N/A 11/18/2012   Procedure: BI-VENTRICULAR IMPLANTABLE CARDIOVERTER DEFIBRILLATOR  (CRT-D);  Surgeon: Marinus Maw, MD;  Location: [facility] CATH LAB;  Service: Cardiovascular;  Laterality: N/A;   BI-VENTRICULAR IMPLANTABLE CARDIOVERTER DEFIBRILLATOR  (CRT-D)  11/18/2012   BIV ICD  GENERATOR CHANGEOUT N/A 11/22/2020   Procedure: BIV ICD GENERATOR CHANGEOUT;  Surgeon: Marinus Maw, MD;  Location: [facility] INVASIVE CV LAB;  Service: Cardiovascular;  Laterality: N/A;   CARDIAC CATHETERIZATION  03/04/12   nonobstructive CAD, elevated LVEDP and tortuous vessels suggestive of long-standing hypertension   COLONOSCOPY WITH PROPOFOL N/A 12/12/2016   Procedure: COLONOSCOPY WITH PROPOFOL;  Surgeon: Jeani Hawking, MD;  Location: [facility] ENDOSCOPY;  Service: Endoscopy;  Laterality: N/A;   JOINT REPLACEMENT     Bilateral hip and right knee   LEFT HEART CATH N/A 03/05/2012   Procedure: LEFT HEART CATH;  Surgeon: Laurey Morale, MD;  Location: [facility] CATH LAB;  Service: Cardiovascular;  Laterality: N/A;   TOTAL KNEE ARTHROPLASTY Left 12/22/2022   Procedure: LEFT TOTAL KNEE ARTHROPLASTY;  Surgeon: Gean Birchwood, MD;  Location: [facility] ORS;  Service: Orthopedics;  Laterality: Left;   Patient Active Problem List   Diagnosis Date Noted   Degenerative arthritis of left knee 12/17/2022   Right foot infection 10/22/2017   Cellulitis in diabetic foot (HCC) 10/22/2017   Hyperglycemia 10/22/2017   Lumbar radiculopathy 09/18/2017   Degenerative spondylolisthesis 09/18/2017   Atrial fibrillation (HCC) 02/11/2017   Paroxysmal A-fib (HCC)    Biventricular automatic implantable cardioverter defibrillator in situ    Sepsis (HCC) 04/20/2016   UTI (urinary tract infection) 04/20/2016   Dyspnea 07/19/2015   Interstitial  lung disease (HCC) 11/20/2014   SIRS (systemic inflammatory response syndrome) (HCC) 02/03/2014   IDDM (insulin dependent diabetes mellitus) 02/03/2014   Tachycardia 06/14/2013   Chronic systolic CHF (congestive heart failure) (HCC) 09/29/2012   Hypoxia 03/06/2012   Hypertensive heart disease 03/06/2012   Nonischemic cardiomyopathy (HCC) 03/06/2012   Tobacco abuse 03/06/2012   Type 2 diabetes mellitus (HCC) 03/06/2012   Hypertension 03/06/2012   CAD (coronary artery disease), native coronary  artery 03/06/2012   Hypokalemia 03/06/2012   Acute bronchitis 02/01/2009   Sleep apnea 02/01/2009   Chest pain 02/01/2009    PCP: Dr. Corliss Blacker  REFERRING PROVIDER: Gean Birchwood, MD  REFERRING DIAG: [phone] (ICD-10-CM) - Unilateral primary osteoarthritis, left knee  THERAPY DIAG:  Muscle weakness (generalized)  Difficulty in walking, not elsewhere classified  Acute pain of left knee  Rationale for Evaluation and Treatment: Rehabilitation  ONSET DATE: 11/26/2022- date of referral   SUBJECTIVE:   SUBJECTIVE STATEMENT: Pt reports knee is doing better. Everything is getting easier at home.   PERTINENT HISTORY: Pt with hx including but not limited to arthritis, DM, hyperglycemia, lumbar radiculopathy, afib, CAD, sleep apnea, cardiomyopathy, bil THA, R TKA  PAIN:  Are you having pain? Yes: NPRS scale: 5/10 Pain location: L knee Pain description: L anterior knee Aggravating factors: bend, standing, walking Relieving factors: rest  PRECAUTIONS: ICD/Pacemaker  RED FLAGS: None   WEIGHT BEARING RESTRICTIONS: No  FALLS:  Has patient fallen in last 6 months? No  LIVING ENVIRONMENT: Lives with: lives alone Lives in: House/apartment Stairs: No Has following equipment at home: Single point cane, Environmental consultant - 2 wheeled, and shower chair  OCCUPATION: Retired  PLOF: Independent  PATIENT GOALS: "improve pain, get back to walking without anything"  NEXT MD VISIT: 02/07/22  OBJECTIVE:  Note: Objective measures were completed at Evaluation unless otherwise noted.  PATIENT SURVEYS:  LEFS 42/80*100 = 52.5%  COGNITION: Overall cognitive status: Within functional limits for tasks assessed     S LOWER EXTREMITY ROM:  Active ROM Right eval Left eval Left 01/14/23 Left 01/23/23  Hip flexion      Hip extension      Hip abduction      Hip adduction      Hip internal rotation      Hip external rotation      Knee flexion 123 100 108 119  Knee extension 0  Lacks 15 deg Lacks 10 deg Lacks 10 deg  Ankle dorsiflexion      Ankle plantarflexion      Ankle inversion      Ankle eversion       (Blank rows = not tested)  LOWER EXTREMITY MMT:  MMT Right eval Left eval  Hip flexion    Hip extension    Hip abduction    Hip adduction    Hip internal rotation    Hip external rotation    Knee flexion    Knee extension    Ankle dorsiflexion    Ankle plantarflexion    Ankle inversion    Ankle eversion     (Blank rows = not tested)    FUNCTIONAL TESTS:  5 times sit to stand: 18 sec with use of bil UE, decrease WB tolerance noted on L LE 10 meter walk test: 0.67 m/s with walker    TODAY'S TREATMENT:  DATE:   Supine heel slides: 15x Passive mobilization of knee into extension <5' Grade II patella mobilization in all directions (most restricted superior and laterally) Light soft tissue work to L distal IT band Supine quad set: 20x 5" holds Supine SAQ: 4lbs 3 x 10 L only (1/2 foam roller under knee) Supine SLR: 10x neutral, 10x VMO biased with hip ERed SL clamshells: 2 x 10 L Only, 4lbs SL hip abduction: 2 x 10 L only SciFit: level 1 for 10' Seated hamstring curls: green band: 2 x 10 L only Bil leg press: 50lbs 2 x 10  Pt did not want ice at end of the session.  PATIENT EDUCATION:  Education details: see above Person educated: Patient Education method: Explanation Education comprehension: verbalized understanding  HOME EXERCISE PROGRAM: Access Code: [email or web address] URL: [email or web address] Date: 01/07/2023 Prepared by: Lavone Nian  Exercises - Supine Heel Slide  - 3 x daily - 7 x weekly - 10 reps - 5 sec hold - Long Sitting Quad Set with Towel Roll Under Heel  - 3 x daily - 7 x weekly - 2 sets - 10 reps - Sit to Stand with Armchair  - 1 x daily - 7 x weekly - 10  reps  ASSESSMENT:  CLINICAL IMPRESSION: Pt made good progress in knee flexion ROM today. Able to progress SLR and hip abduction from AA to active only today.  OBJECTIVE IMPAIRMENTS: Abnormal gait, decreased activity tolerance, decreased balance, decreased endurance, decreased mobility, difficulty walking, decreased ROM, decreased strength, hypomobility, increased edema, increased muscle spasms, impaired flexibility, and pain.   ACTIVITY LIMITATIONS: carrying, lifting, bending, standing, squatting, stairs, and transfers  PARTICIPATION LIMITATIONS: meal prep, cleaning, laundry, driving, shopping, community activity, and yard work  PERSONAL FACTORS: Time since onset of injury/illness/exacerbation are also affecting patient's functional outcome.   REHAB POTENTIAL: Excellent  CLINICAL DECISION MAKING: Stable/uncomplicated  EVALUATION COMPLEXITY: Low   GOALS: Goals reviewed with patient? Yes  SHORT TERM GOALS: Target date: 02/04/2023   Pt will demo -5 to 110 deg of L knee AROM to improve knee ROM with functional transfers, bed mobility and transfers Baseline: -15 to 100 deg; -10 to 119 (01/23/23) Goal status: Progressing  2.  PT will demo at least 50% compliance with HEP to self manage symptoms Baseline: issued on 01/07/23; pt reports 75% compliance (01/23/23) Goal status: goal met  3.   LONG TERM GOALS: Target date: 03/04/2023    Patient will demo 0-120 deg of L knee AROM to improve squatting, functional transfers and car transfers Baseline: -15 to 100 deg  Goal status: INITIAL  2.  Pt will demo gait speed of >0.80 m/s without AD to improve gait quality, improve community ambulation. Baseline: 0.67 m/s with RW (01/07/23) Goal status: INITIAL  3.  Pt will report at least 9 points improvement on LEFS to improve overall function. Baseline: 42/80 (01/07/23) Goal status: INITIAL  4.  Pt will be able to perform 5x sit to stand without use of bil UE to improve functional strength  with transfers in under 18 seconds Baseline: 18 sec WITH use of bil UE (01/07/23) Goal status: INITIAL  5.  Pt will report of pain of <3/10 at worst in L knee with daily functioning. Baseline: 7/10 (01/07/23) Goal status: INITIAL     PLAN:  PT FREQUENCY: 2x/week  PT DURATION: 8 weeks  PLANNED INTERVENTIONS: 97164- PT Re-evaluation, 97110-Therapeutic exercises, 97530- Therapeutic activity, [id number]- Neuromuscular re-education, 97535- Self Care, 11914- Manual therapy, [phone]- Gait training, Patient/Family education, Balance training,  Stair training, Taping, Dry Needling, Joint mobilization, Scar mobilization, Cryotherapy, Moist heat, and Manual therapy  PLAN FOR NEXT SESSION:progress HEP as tolerated, initiate fwd/bwd walking at countertop with one UE support   Ileana Ladd, PT 01/23/2023, 12:31 PM

## 2023-01-26 ENCOUNTER — Ambulatory Visit: Payer: 59 | Admitting: Physical Therapy

## 2023-01-26 DIAGNOSIS — M25562 Pain in left knee: Secondary | ICD-10-CM

## 2023-01-26 DIAGNOSIS — R262 Difficulty in walking, not elsewhere classified: Secondary | ICD-10-CM

## 2023-01-26 DIAGNOSIS — M6281 Muscle weakness (generalized): Secondary | ICD-10-CM | POA: Diagnosis not present

## 2023-01-26 NOTE — Therapy (Signed)
OUTPATIENT PHYSICAL THERAPY LOWER EXTREMITY TREATMENT   Patient Name: [name] MRN: [id number] DOB:1950/11/02, 72 y.o., female Today's Date: 01/26/2023  END OF SESSION:  PT End of Session - 01/26/23 1054     Visit Number 4    Number of Visits 10    Date for PT Re-Evaluation 03/04/23    Authorization Type UHC Medicare/medicaid    PT Start Time 1100    PT Stop Time 1140    PT Time Calculation (min) 40 min    Equipment Utilized During Treatment Gait belt    Activity Tolerance Patient tolerated treatment well    Behavior During Therapy WFL for tasks assessed/performed               Past Medical History:  Diagnosis Date   Anemia    a. Noted on 07/2012 labs, instructed to f/u PCP.   Arthritis    "joints" (11/18/2012)   CAD (coronary artery disease), native coronary artery    a. Nonobstructive by cath 02/2012 (done because of low EF).   Chronic bronchitis (HCC)    "~ every other year" (11/18/2012)   Chronic combined systolic and diastolic CHF (congestive heart failure) (HCC)    a. 03/05/12 echo:  LVEF 20-25%, moderate LVH , inferior and basal to mid septal akinesis, anterior moderate to severe hypokinesis and grade 2 diastolic dysfunction. b. EF 07/2012: EF still 25% (unclear medication compliance).   Chronic lower back pain    Headache(784.0)    "often; maybe not daily" (11/18/2012)   High cholesterol    History of noncompliance with medical treatment    Hypertension    LBBB (left bundle branch block)    Orthopnea    Pneumonia    Tobacco abuse    Type II diabetes mellitus (HCC)    Past Surgical History:  Procedure Laterality Date   BI-VENTRICULAR IMPLANTABLE CARDIOVERTER DEFIBRILLATOR N/A 11/18/2012   Procedure: BI-VENTRICULAR IMPLANTABLE CARDIOVERTER DEFIBRILLATOR  (CRT-D);  Surgeon: Marinus Maw, MD;  Location: [facility] CATH LAB;  Service: Cardiovascular;  Laterality: N/A;   BI-VENTRICULAR IMPLANTABLE CARDIOVERTER DEFIBRILLATOR  (CRT-D)  11/18/2012   BIV ICD  GENERATOR CHANGEOUT N/A 11/22/2020   Procedure: BIV ICD GENERATOR CHANGEOUT;  Surgeon: Marinus Maw, MD;  Location: [facility] INVASIVE CV LAB;  Service: Cardiovascular;  Laterality: N/A;   CARDIAC CATHETERIZATION  03/04/12   nonobstructive CAD, elevated LVEDP and tortuous vessels suggestive of long-standing hypertension   COLONOSCOPY WITH PROPOFOL N/A 12/12/2016   Procedure: COLONOSCOPY WITH PROPOFOL;  Surgeon: Jeani Hawking, MD;  Location: [facility] ENDOSCOPY;  Service: Endoscopy;  Laterality: N/A;   JOINT REPLACEMENT     Bilateral hip and right knee   LEFT HEART CATH N/A 03/05/2012   Procedure: LEFT HEART CATH;  Surgeon: Laurey Morale, MD;  Location: [facility] CATH LAB;  Service: Cardiovascular;  Laterality: N/A;   TOTAL KNEE ARTHROPLASTY Left 12/22/2022   Procedure: LEFT TOTAL KNEE ARTHROPLASTY;  Surgeon: Gean Birchwood, MD;  Location: [facility] ORS;  Service: Orthopedics;  Laterality: Left;   Patient Active Problem List   Diagnosis Date Noted   Degenerative arthritis of left knee 12/17/2022   Right foot infection 10/22/2017   Cellulitis in diabetic foot (HCC) 10/22/2017   Hyperglycemia 10/22/2017   Lumbar radiculopathy 09/18/2017   Degenerative spondylolisthesis 09/18/2017   Atrial fibrillation (HCC) 02/11/2017   Paroxysmal A-fib (HCC)    Biventricular automatic implantable cardioverter defibrillator in situ    Sepsis (HCC) 04/20/2016   UTI (urinary tract infection) 04/20/2016   Dyspnea 07/19/2015  Interstitial lung disease (HCC) 11/20/2014   SIRS (systemic inflammatory response syndrome) (HCC) 02/03/2014   IDDM (insulin dependent diabetes mellitus) 02/03/2014   Tachycardia 06/14/2013   Chronic systolic CHF (congestive heart failure) (HCC) 09/29/2012   Hypoxia 03/06/2012   Hypertensive heart disease 03/06/2012   Nonischemic cardiomyopathy (HCC) 03/06/2012   Tobacco abuse 03/06/2012   Type 2 diabetes mellitus (HCC) 03/06/2012   Hypertension 03/06/2012   CAD (coronary artery disease), native coronary  artery 03/06/2012   Hypokalemia 03/06/2012   Acute bronchitis 02/01/2009   Sleep apnea 02/01/2009   Chest pain 02/01/2009    PCP: Dr. Corliss Blacker  REFERRING PROVIDER: Gean Birchwood, MD  REFERRING DIAG: [phone] (ICD-10-CM) - Unilateral primary osteoarthritis, left knee  THERAPY DIAG:  Muscle weakness (generalized)  Difficulty in walking, not elsewhere classified  Acute pain of left knee  Rationale for Evaluation and Treatment: Rehabilitation  ONSET DATE: 11/26/2022- date of referral; 12/22/22 L TKA  SUBJECTIVE:   SUBJECTIVE STATEMENT: Pt states knee is feeling stiff today. Exercises have been going well. No longer using any a/d at home.   PERTINENT HISTORY: Pt with hx including but not limited to arthritis, DM, hyperglycemia, lumbar radiculopathy, afib, CAD, sleep apnea, cardiomyopathy, bil THA, R TKA  PAIN:  Are you having pain? Yes: NPRS scale: 8/10 Pain location: L knee Pain description: L anterior knee Aggravating factors: bend, standing, walking Relieving factors: rest  PRECAUTIONS: ICD/Pacemaker  RED FLAGS: None   WEIGHT BEARING RESTRICTIONS: No  FALLS:  Has patient fallen in last 6 months? No  LIVING ENVIRONMENT: Lives with: lives alone Lives in: House/apartment Stairs: No Has following equipment at home: Single point cane, Environmental consultant - 2 wheeled, and shower chair  OCCUPATION: Retired  PLOF: Independent  PATIENT GOALS: "improve pain, get back to walking without anything"  NEXT MD VISIT: 02/07/22  OBJECTIVE:  Note: Objective measures were completed at Evaluation unless otherwise noted.  PATIENT SURVEYS:  LEFS 42/80*100 = 52.5%  COGNITION: Overall cognitive status: Within functional limits for tasks assessed     S LOWER EXTREMITY ROM:  Active ROM Right eval Left eval Left 01/14/23 Left 01/23/23  Hip flexion      Hip extension      Hip abduction      Hip adduction      Hip internal rotation      Hip external rotation      Knee  flexion 123 100 108 119  Knee extension 0 Lacks 15 deg Lacks 10 deg Lacks 10 deg  Ankle dorsiflexion      Ankle plantarflexion      Ankle inversion      Ankle eversion       (Blank rows = not tested)  LOWER EXTREMITY MMT:  MMT Right eval Left eval  Hip flexion    Hip extension    Hip abduction    Hip adduction    Hip internal rotation    Hip external rotation    Knee flexion    Knee extension    Ankle dorsiflexion    Ankle plantarflexion    Ankle inversion    Ankle eversion     (Blank rows = not tested)    FUNCTIONAL TESTS:  5 times sit to stand: 18 sec with use of bil UE, decrease WB tolerance noted on L LE 10 meter walk test: 0.67 m/s with walker    TODAY'S TREATMENT:  DATE: 01/26/2023 Scifit L3 x 5 min UEs/LEs Supine heel slides x10  Hamstring stretch with strap 2x 30" Passive knee ext stretch x 30" Supine quad set 20x 5" holds Supine short range SLR 2x10 with foot hanging off wedge Gastroc stretch with strap in sup x30" Grade II patella mobilization in all directions (most restricted superior and laterally) Supine SAQ: 4lbs 3 x 10 L only (1/2 foam roller under knee) Sidelying hip abd 2x10 Prone hamstring curl 4lbs 2x10  Prone hip ext 2x10 Standing tandem stance 2x30" with intermittent UE assist Bwd walking at counter Tandem walking at counter  Side stepping at counter    PATIENT EDUCATION:  Education details: see above Person educated: Patient Education method: Explanation Education comprehension: verbalized understanding  HOME EXERCISE PROGRAM: Access Code: [email or web address] URL: [email or web address] Date: 01/07/2023 Prepared by: Lavone Nian  Exercises - Supine Heel Slide  - 3 x daily - 7 x weekly - 10 reps - 5 sec hold - Long Sitting Quad Set with Towel Roll Under Heel  - 3 x daily - 7 x weekly - 2 sets - 10  reps - Sit to Stand with Armchair  - 1 x daily - 7 x weekly - 10 reps  ASSESSMENT:  CLINICAL IMPRESSION: Continuing to work on improving pt's knee extension and progressive strengthening. Able to improve to 8 deg away from total knee extension today. Provided standing and balance exercises today.   OBJECTIVE IMPAIRMENTS: Abnormal gait, decreased activity tolerance, decreased balance, decreased endurance, decreased mobility, difficulty walking, decreased ROM, decreased strength, hypomobility, increased edema, increased muscle spasms, impaired flexibility, and pain.    GOALS: Goals reviewed with patient? Yes  SHORT TERM GOALS: Target date: 02/04/2023   Pt will demo -5 to 110 deg of L knee AROM to improve knee ROM with functional transfers, bed mobility and transfers Baseline: -15 to 100 deg; -10 to 119 (01/23/23) Goal status: Progressing  2.  PT will demo at least 50% compliance with HEP to self manage symptoms Baseline: issued on 01/07/23; pt reports 75% compliance (01/23/23) Goal status: goal met  3.   LONG TERM GOALS: Target date: 03/04/2023    Patient will demo 0-120 deg of L knee AROM to improve squatting, functional transfers and car transfers Baseline: -15 to 100 deg  Goal status: INITIAL  2.  Pt will demo gait speed of >0.80 m/s without AD to improve gait quality, improve community ambulation. Baseline: 0.67 m/s with RW (01/07/23) Goal status: INITIAL  3.  Pt will report at least 9 points improvement on LEFS to improve overall function. Baseline: 42/80 (01/07/23) Goal status: INITIAL  4.  Pt will be able to perform 5x sit to stand without use of bil UE to improve functional strength with transfers in under 18 seconds Baseline: 18 sec WITH use of bil UE (01/07/23) Goal status: INITIAL  5.  Pt will report of pain of <3/10 at worst in L knee with daily functioning. Baseline: 7/10 (01/07/23) Goal status: INITIAL     PLAN:  PT FREQUENCY: 2x/week  PT DURATION: 8  weeks  PLANNED INTERVENTIONS: 97164- PT Re-evaluation, 97110-Therapeutic exercises, 97530- Therapeutic activity, 97112- Neuromuscular re-education, 97535- Self Care, 54098- Manual therapy, [phone]- Gait training, Patient/Family education, Balance training, Stair training, Taping, Dry Needling, Joint mobilization, Scar mobilization, Cryotherapy, Moist heat, and Manual therapy  PLAN FOR NEXT SESSION:progress HEP as tolerated, initiate fwd/bwd walking at countertop with one UE support   Patton Swisher April Ma L Teshara Moree, PT 01/26/2023, 10:54 AM

## 2023-01-28 ENCOUNTER — Emergency Department (HOSPITAL_COMMUNITY)
Admission: EM | Admit: 2023-01-28 | Discharge: 2023-01-28 | Disposition: A | Discharge status: Home / Self Care | Payer: 59 | Attending: Emergency Medicine | Admitting: Emergency Medicine

## 2023-01-28 ENCOUNTER — Other Ambulatory Visit: Payer: Self-pay

## 2023-01-28 ENCOUNTER — Encounter (HOSPITAL_COMMUNITY): Payer: Self-pay

## 2023-01-28 DIAGNOSIS — Z95810 Presence of automatic (implantable) cardiac defibrillator: Secondary | ICD-10-CM | POA: Insufficient documentation

## 2023-01-28 DIAGNOSIS — E119 Type 2 diabetes mellitus without complications: Secondary | ICD-10-CM | POA: Diagnosis not present

## 2023-01-28 DIAGNOSIS — I2510 Atherosclerotic heart disease of native coronary artery without angina pectoris: Secondary | ICD-10-CM | POA: Insufficient documentation

## 2023-01-28 DIAGNOSIS — I959 Hypotension, unspecified: Secondary | ICD-10-CM | POA: Diagnosis not present

## 2023-01-28 DIAGNOSIS — R1013 Epigastric pain: Secondary | ICD-10-CM | POA: Diagnosis not present

## 2023-01-28 DIAGNOSIS — Z7901 Long term (current) use of anticoagulants: Secondary | ICD-10-CM | POA: Diagnosis not present

## 2023-01-28 DIAGNOSIS — Z794 Long term (current) use of insulin: Secondary | ICD-10-CM | POA: Insufficient documentation

## 2023-01-28 DIAGNOSIS — R059 Cough, unspecified: Secondary | ICD-10-CM | POA: Diagnosis not present

## 2023-01-28 DIAGNOSIS — D649 Anemia, unspecified: Secondary | ICD-10-CM | POA: Diagnosis not present

## 2023-01-28 DIAGNOSIS — R112 Nausea with vomiting, unspecified: Secondary | ICD-10-CM | POA: Insufficient documentation

## 2023-01-28 DIAGNOSIS — B338 Other specified viral diseases: Secondary | ICD-10-CM

## 2023-01-28 DIAGNOSIS — R197 Diarrhea, unspecified: Secondary | ICD-10-CM | POA: Insufficient documentation

## 2023-01-28 DIAGNOSIS — E876 Hypokalemia: Secondary | ICD-10-CM | POA: Diagnosis not present

## 2023-01-28 DIAGNOSIS — I5042 Chronic combined systolic (congestive) and diastolic (congestive) heart failure: Secondary | ICD-10-CM | POA: Insufficient documentation

## 2023-01-28 DIAGNOSIS — I110 Hypertensive heart disease with heart failure: Secondary | ICD-10-CM | POA: Diagnosis not present

## 2023-01-28 DIAGNOSIS — I4891 Unspecified atrial fibrillation: Secondary | ICD-10-CM | POA: Diagnosis not present

## 2023-01-28 DIAGNOSIS — B974 Respiratory syncytial virus as the cause of diseases classified elsewhere: Secondary | ICD-10-CM | POA: Diagnosis not present

## 2023-01-28 DIAGNOSIS — Z20822 Contact with and (suspected) exposure to covid-19: Secondary | ICD-10-CM | POA: Insufficient documentation

## 2023-01-28 LAB — CBC WITH DIFFERENTIAL/PLATELET
Abs Immature Granulocytes: 0.01 10*3/uL (ref 0.00–0.07)
Basophils Absolute: 0.1 10*3/uL (ref 0.0–0.1)
Basophils Relative: 1 %
Eosinophils Absolute: 0.3 10*3/uL (ref 0.0–0.5)
Eosinophils Relative: 5 %
HCT: 28.8 % — ABNORMAL LOW (ref 36.0–46.0)
Hemoglobin: 9.4 g/dL — ABNORMAL LOW (ref 12.0–15.0)
Immature Granulocytes: 0 %
Lymphocytes Relative: 20 %
Lymphs Abs: 1.4 10*3/uL (ref 0.7–4.0)
MCH: 30.6 pg (ref 26.0–34.0)
MCHC: 32.6 g/dL (ref 30.0–36.0)
MCV: 93.8 fL (ref 80.0–100.0)
Monocytes Absolute: 0.7 10*3/uL (ref 0.1–1.0)
Monocytes Relative: 10 %
Neutro Abs: 4.6 10*3/uL (ref 1.7–7.7)
Neutrophils Relative %: 64 %
Platelets: 377 10*3/uL (ref 150–400)
RBC: 3.07 MIL/uL — ABNORMAL LOW (ref 3.87–5.11)
RDW: 12.8 % (ref 11.5–15.5)
WBC: 7.1 10*3/uL (ref 4.0–10.5)
nRBC: 0 % (ref 0.0–0.2)

## 2023-01-28 LAB — COMPREHENSIVE METABOLIC PANEL
ALT: 7 U/L (ref 0–44)
AST: 13 U/L — ABNORMAL LOW (ref 15–41)
Albumin: 3.6 g/dL (ref 3.5–5.0)
Alkaline Phosphatase: 59 U/L (ref 38–126)
Anion gap: 8 (ref 5–15)
BUN: 19 mg/dL (ref 8–23)
CO2: 25 mmol/L (ref 22–32)
Calcium: 8.3 mg/dL — ABNORMAL LOW (ref 8.9–10.3)
Chloride: 103 mmol/L (ref 98–111)
Creatinine, Ser: 1.29 mg/dL — ABNORMAL HIGH (ref 0.44–1.00)
GFR, Estimated: 45 mL/min — ABNORMAL LOW (ref >60)
Glucose, Bld: 106 mg/dL — ABNORMAL HIGH (ref 70–99)
Potassium: 2.8 mmol/L — ABNORMAL LOW (ref 3.5–5.1)
Sodium: 136 mmol/L (ref 135–145)
Total Bilirubin: 0.4 mg/dL (ref 0.0–1.2)
Total Protein: 7.2 g/dL (ref 6.5–8.1)

## 2023-01-28 LAB — LIPASE, BLOOD: Lipase: 29 U/L (ref 11–51)

## 2023-01-28 LAB — RESP PANEL BY RT-PCR (RSV, FLU A&B, COVID)  RVPGX2
Influenza A by PCR: NEGATIVE
Influenza B by PCR: NEGATIVE
Resp Syncytial Virus by PCR: POSITIVE — AB
SARS Coronavirus 2 by RT PCR: NEGATIVE

## 2023-01-28 LAB — POC OCCULT BLOOD, ED: Fecal Occult Bld: NEGATIVE

## 2023-01-28 MED ORDER — POTASSIUM CHLORIDE CRYS ER 20 MEQ PO TBCR: EXTENDED_RELEASE_TABLET | ORAL | 0 refills | Status: DC

## 2023-01-28 MED ORDER — ONDANSETRON HCL 4 MG/2ML IJ SOLN: 4.0000 mg | Freq: Once | INTRAMUSCULAR | Status: DC

## 2023-01-28 MED ORDER — ONDANSETRON 4 MG PO TBDP: 4.0000 mg | ORAL_TABLET | Freq: Three times a day (TID) | ORAL | 0 refills | Status: DC | PRN

## 2023-01-28 MED ORDER — POTASSIUM CHLORIDE CRYS ER 20 MEQ PO TBCR
40.0000 meq | EXTENDED_RELEASE_TABLET | Freq: Once | ORAL | Status: AC
  Administered 2023-01-28: 40 meq via ORAL
  Filled 2023-01-28: qty 2

## 2023-01-28 MED ORDER — LACTATED RINGERS IV BOLUS
500.0000 mL | Freq: Once | INTRAVENOUS | Status: AC
  Administered 2023-01-28: 500 mL via INTRAVENOUS

## 2023-01-28 NOTE — ED Triage Notes (Signed)
 Patient BIB [facility] from home with c/o N/V/ abdominal pain x2 days.  States she has grand kids with similar sx.  Patient is alert and in no acute distress on arrival

## 2023-01-28 NOTE — ED Provider Notes (Signed)
 [place] EMERGENCY DEPARTMENT AT [facility] Provider Note   CSN: [id number] Arrival date & time: 01/28/23  9182     History  Chief Complaint  Patient presents with   Emesis    [name] is a 73 y.o. female.  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  Reports that she was her ground her grandkids around the holidays and there was a sick child who is there, and now some of her other families have been sick with nausea, vomiting and diarrhea and she has developed the same over the last 2 days.  Reports she initially tried to manage her symptoms at home, but when it continued this morning she decided to come be evaluated.  Reports that she has not been able to eat or drink much, but was able to have rice insulins last night.  She has had about 2 episodes of vomiting each day, as well as diarrhea every time she goes to urinate she reports about 3 times per day.  Reports is watery.  No black or bloody stools.  Reports some mild upper abdominal pain in association with the symptoms.  Also reports she has had cough for the past 2 days and coughed up some mucus this morning.  Denies any shortness of breath or chest pain.  No leg swelling.  No urinary symptoms.  Also reports some sore throat denies any fevers.  Denies any recent antibiotics.  Past Medical History:  Diagnosis Date   Anemia    a. Noted on 07/2012 labs, instructed to f/u PCP.   Arthritis    joints (11/18/2012)   CAD (coronary artery disease), native coronary artery    a. Nonobstructive by cath 02/2012 (done because of low EF).   Chronic bronchitis (HCC)    ~ every other year (11/18/2012)   Chronic combined systolic and diastolic CHF (congestive heart  failure) (HCC)    a. 03/05/12 echo:  LVEF 20-25%, moderate LVH , inferior and basal to mid septal akinesis, anterior moderate to severe hypokinesis and grade 2 diastolic dysfunction. b. EF 07/2012: EF still 25% (unclear medication compliance).   Chronic lower back pain    Headache(784.0)    often; maybe not daily (11/18/2012)   High cholesterol    History of noncompliance with medical treatment    Hypertension    LBBB (left bundle branch block)    Orthopnea    Pneumonia    Tobacco abuse    Type II diabetes mellitus (HCC)      Home Medications Prior to Admission medications   Medication Sig Start Date End Date Taking? Authorizing Provider  ondansetron  (ZOFRAN -ODT) 4 MG disintegrating tablet Take 1 tablet (4 mg total) by mouth every 8 (eight) hours as needed for nausea or vomiting. 01/28/23  Yes Dreama Longs, MD  potassium chloride  SA (KLOR-CON  M) 20 MEQ tablet Take 2 tablets today after returning home (40meq), then 20meq tonight before bed, then for 2 days take 20meq twice a day. 01/28/23  Yes Dreama Longs, MD  apixaban  (ELIQUIS ) 2.5 MG TABS tablet Take 1 tablet (2.5 mg total) by mouth 2 (two) times daily. 12/22/22   Liam Lerner, MD  gabapentin  (NEURONTIN ) 100 MG capsule Take 100 mg by mouth 3 (three) times daily. 03/10/18  [provider]  glucose blood (FREESTYLE TEST STRIPS) test strip Use as instructed 04/25/16   Ghimire, Donalda HERO, MD  glucose monitoring kit (FREESTYLE) monitoring kit 1 each by Does not apply route 4 (four) times daily - after meals and at bedtime. 1 month Diabetic Testing Supplies for QAC-QHS accuchecks. 04/25/16   Ghimire, Donalda HERO, MD  HUMALOG KWIKPEN 100 UNIT/ML KwikPen Inject 20 Units into the skin 2 (two) times daily. Sliding scale 02/12/18   [provider]  insulin  glargine (LANTUS ) 100 UNIT/ML Solostar Pen Inject 50 Units into the skin at bedtime.    [provider]  Insulin  Pen Needle 32G X 8 MM MISC Use as directed 06/18/16    Smith, Quinetta Shilling E, NP  isosorbide -hydrALAZINE  (BIDIL ) 20-37.5 MG tablet Take 2 tablets by mouth 3 (three) times daily. 09/04/22   [provider]  Lancets (FREESTYLE) lancets Use as instructed 06/18/16   Claudene Rocky BRAVO, NP  Semaglutide,0.25 or 0.5MG /DOS, (OZEMPIC, 0.25 OR 0.5 MG/DOSE,) 2 MG/1.5ML SOPN Inject 0.5 mg into the skin every Wednesday.    [provider]  Tafamidis  (VYNDAMAX ) 61 MG CAPS TAKE 1 CAPSULE (61 MG) BY MOUTH DAILY. 05/09/22   Rolan Ezra RAMAN, MD  tiZANidine  (ZANAFLEX ) 2 MG tablet Take 1 tablet (2 mg total) by mouth 3 (three) times daily. 12/22/22   Liam Lerner, MD  torsemide  (DEMADEX ) 100 MG tablet Take 150 mg by mouth daily. 10/16/22   [provider]      Allergies    Morphine and codeine and Penicillins    Review of Systems   Review of Systems  Physical Exam Updated Vital Signs BP (!) 146/84   Pulse 93   Temp 99.1 F (37.3 C) (Oral)   Resp 17   Ht 5' 7 (1.702 m)   Wt 101.2 kg   SpO2 100%   BMI 34.93 kg/m  Physical Exam Vitals and nursing note reviewed.  Constitutional:      General: She is not in acute distress.    Appearance: She is well-developed. She is not diaphoretic.  HENT:     Head: Normocephalic and atraumatic.  Eyes:     Conjunctiva/sclera: Conjunctivae normal.  Cardiovascular:     Rate and Rhythm: Normal rate and regular rhythm.     Heart sounds: Normal heart sounds. No murmur heard.    No friction rub. No gallop.  Pulmonary:     Effort: Pulmonary effort is normal. No respiratory distress.     Breath sounds: Normal breath sounds. No wheezing or rales.  Abdominal:     General: There is no distension.     Palpations: Abdomen is soft.     Tenderness: There is abdominal tenderness (mild epigastric). There is no guarding.  Musculoskeletal:        General: No tenderness.     Cervical back: Normal range of motion.  Skin:    General: Skin is warm and dry.     Findings: No erythema or rash.  Neurological:     Mental  Status: She is alert and oriented to person, place, and time.     ED Results / Procedures / Treatments   Labs (all labs ordered are listed, but only abnormal results are displayed) Labs Reviewed  RESP PANEL BY RT-PCR (RSV, FLU A&B, COVID)  RVPGX2 - Abnormal; Notable for the following components:      Result Value   Resp Syncytial Virus by PCR POSITIVE (*)    All other components within normal limits  CBC  WITH DIFFERENTIAL/PLATELET - Abnormal; Notable for the following components:   RBC 3.07 (*)    Hemoglobin 9.4 (*)    HCT 28.8 (*)    All other components within normal limits  COMPREHENSIVE METABOLIC PANEL - Abnormal; Notable for the following components:   Potassium 2.8 (*)    Glucose, Bld 106 (*)    Creatinine, Ser 1.29 (*)    Calcium  8.3 (*)    AST 13 (*)    GFR, Estimated 45 (*)    All other components within normal limits  LIPASE, BLOOD  POC OCCULT BLOOD, ED    EKG None  Radiology No results found.  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  Given combination of symptoms and sick contacts, suspect most likely viral infection with cough, nausea, vomiting and diarrhea.  Given her underlying medications, decreased p.o., vomiting and diarrhea will obtain labs to evaluate for signs of electrolyte abnormalities, given her  upper abdominal pain will evaluate with CMP, lipase.  COVID/flu and RSV testing are ordered and pending.  She she has underlying congestive heart failure, but does appear dehydrated today, will give 500 cc of LR and Zofran .  Lungs are clear, no fever, low suspicion at this time for bacterial pneumonia.  Labs completed and personally about interpreted by me show hemoglobin of 9.4, decreased from 11.6 2 months ago. Denies black or bloody stools, low suspicion for acute GI bleed to require admission at this time however recommend recheck hgb as an outpatient and continued outpatient evaluation. Rectal exam performed without significant stool sample however no signs of anemia or bleeding grossly.   CMP shows hypokalemia with a potassium of 2.8, creatinine 1.29 which has improved from September, however slightly worsened since November.  No transaminitis, no sign of pancreatitis.  EKG completed and evaluated by me with atrial sensed ventricular paced rhythm similar to prior.  RSV testing is positive.  Given K replacement in ED and prescription for same.  Given rx for zofran .  Recommend continued supportive care for RSV and diarrhea. Patient discharged in stable condition with understanding of reasons to return.         Final Clinical Impression(s) / ED Diagnoses Final diagnoses:  Nausea vomiting and diarrhea  RSV (respiratory syncytial virus infection)  Hypokalemia  Anemia, unspecified type    Rx / DC Orders ED Discharge Orders          Ordered    ondansetron  (ZOFRAN -ODT) 4 MG disintegrating tablet  Every 8 hours PRN        01/28/23 1040    potassium chloride  SA (KLOR-CON  M) 20 MEQ tablet        01/28/23 1040  Dreama Longs, MD 01/28/23 1040

## 2023-01-30 ENCOUNTER — Ambulatory Visit: Payer: 59 | Attending: Orthopedic Surgery

## 2023-01-30 DIAGNOSIS — R262 Difficulty in walking, not elsewhere classified: Secondary | ICD-10-CM | POA: Insufficient documentation

## 2023-01-30 DIAGNOSIS — M25562 Pain in left knee: Secondary | ICD-10-CM | POA: Insufficient documentation

## 2023-01-30 DIAGNOSIS — M6281 Muscle weakness (generalized): Secondary | ICD-10-CM | POA: Diagnosis not present

## 2023-01-30 NOTE — Therapy (Signed)
 OUTPATIENT PHYSICAL THERAPY LOWER EXTREMITY TREATMENT   Patient Name: [name] MRN: [id number] DOB:August 31, 1950, 73 y.o., female Today's Date: 01/30/2023  END OF SESSION:  PT End of Session - 01/30/23 1113     Visit Number 5    Number of Visits 10    Date for PT Re-Evaluation 03/04/23    Authorization Type [employer] Medicare/medicaid    PT Start Time 1100    PT Stop Time 1145    PT Time Calculation (min) 45 min    Equipment Utilized During Treatment Gait belt    Activity Tolerance Patient tolerated treatment well    Behavior During Therapy WFL for tasks assessed/performed               Past Medical History:  Diagnosis Date   Anemia    a. Noted on 07/2012 labs, instructed to f/u PCP.   Arthritis    joints (11/18/2012)   CAD (coronary artery disease), native coronary artery    a. Nonobstructive by cath 02/2012 (done because of low EF).   Chronic bronchitis (HCC)    ~ every other year (11/18/2012)   Chronic combined systolic and diastolic CHF (congestive heart failure) (HCC)    a. 03/05/12 echo:  LVEF 20-25%, moderate LVH , inferior and basal to mid septal akinesis, anterior moderate to severe hypokinesis and grade 2 diastolic dysfunction. b. EF 07/2012: EF still 25% (unclear medication compliance).   Chronic lower back pain    Headache(784.0)    often; maybe not daily (11/18/2012)   High cholesterol    History of noncompliance with medical treatment    Hypertension    LBBB (left bundle branch block)    Orthopnea    Pneumonia    Tobacco abuse    Type II diabetes mellitus (HCC)    Past Surgical History:  Procedure Laterality Date   BI-VENTRICULAR IMPLANTABLE CARDIOVERTER DEFIBRILLATOR N/A 11/18/2012   Procedure: BI-VENTRICULAR IMPLANTABLE CARDIOVERTER DEFIBRILLATOR  (CRT-D);  Surgeon: Danelle LELON Birmingham, MD;  Location: [facility] CATH LAB;  Service: Cardiovascular;  Laterality: N/A;   BI-VENTRICULAR IMPLANTABLE CARDIOVERTER DEFIBRILLATOR  (CRT-D)  11/18/2012   BIV ICD  GENERATOR CHANGEOUT N/A 11/22/2020   Procedure: BIV ICD GENERATOR CHANGEOUT;  Surgeon: Birmingham Danelle LELON, MD;  Location: [facility] INVASIVE CV LAB;  Service: Cardiovascular;  Laterality: N/A;   CARDIAC CATHETERIZATION  03/04/12   nonobstructive CAD, elevated LVEDP and tortuous vessels suggestive of long-standing hypertension   COLONOSCOPY WITH PROPOFOL  N/A 12/12/2016   Procedure: COLONOSCOPY WITH PROPOFOL ;  Surgeon: Rollin Dover, MD;  Location: [facility] ENDOSCOPY;  Service: Endoscopy;  Laterality: N/A;   JOINT REPLACEMENT     Bilateral hip and right knee   LEFT HEART CATH N/A 03/05/2012   Procedure: LEFT HEART CATH;  Surgeon: Ezra GORMAN Shuck, MD;  Location: [facility] CATH LAB;  Service: Cardiovascular;  Laterality: N/A;   TOTAL KNEE ARTHROPLASTY Left 12/22/2022   Procedure: LEFT TOTAL KNEE ARTHROPLASTY;  Surgeon: Liam Lerner, MD;  Location: [facility] ORS;  Service: Orthopedics;  Laterality: Left;   Patient Active Problem List   Diagnosis Date Noted   Degenerative arthritis of left knee 12/17/2022   Right foot infection 10/22/2017   Cellulitis in diabetic foot (HCC) 10/22/2017   Hyperglycemia 10/22/2017   Lumbar radiculopathy 09/18/2017   Degenerative spondylolisthesis 09/18/2017   Atrial fibrillation (HCC) 02/11/2017   Paroxysmal A-fib (HCC)    Biventricular automatic implantable cardioverter defibrillator in situ    Sepsis (HCC) 04/20/2016   UTI (urinary tract infection) 04/20/2016   Dyspnea 07/19/2015  Interstitial lung disease (HCC) 11/20/2014   SIRS (systemic inflammatory response syndrome) (HCC) 02/03/2014   IDDM (insulin  dependent diabetes mellitus) 02/03/2014   Tachycardia 06/14/2013   Chronic systolic CHF (congestive heart failure) (HCC) 09/29/2012   Hypoxia 03/06/2012   Hypertensive heart disease 03/06/2012   Nonischemic cardiomyopathy (HCC) 03/06/2012   Tobacco abuse 03/06/2012   Type 2 diabetes mellitus (HCC) 03/06/2012   Hypertension 03/06/2012   CAD (coronary artery disease), native coronary  artery 03/06/2012   Hypokalemia 03/06/2012   Acute bronchitis 02/01/2009   Sleep apnea 02/01/2009   Chest pain 02/01/2009    PCP: Dr. Sula Sigrid Kays  REFERRING PROVIDER: Liam Lerner, MD  REFERRING DIAG: [id number] (ICD-10-CM) - Unilateral primary osteoarthritis, left knee  THERAPY DIAG:  Muscle weakness (generalized)  Difficulty in walking, not elsewhere classified  Acute pain of left knee  Rationale for Evaluation and Treatment: Rehabilitation  ONSET DATE: 11/26/2022- date of referral; 12/22/22 L TKA  SUBJECTIVE:   SUBJECTIVE STATEMENT: Pt reports I was very sore after last time. My knee blew up when I went home. I was in ED because of some nausea, vomitting and diarrhea, feeling better. Had ortho MD appt yesterday, didn't do X-rays but I go back in 4 months to see him again and he will take X-rays then.   PERTINENT HISTORY: Pt with hx including but not limited to arthritis, DM, hyperglycemia, lumbar radiculopathy, afib, CAD, sleep apnea, cardiomyopathy, bil THA, R TKA  PAIN:  PAIN:  Are you having pain? No   PRECAUTIONS: ICD/Pacemaker  RED FLAGS: None   WEIGHT BEARING RESTRICTIONS: No  FALLS:  Has patient fallen in last 6 months? No  LIVING ENVIRONMENT: Lives with: lives alone Lives in: House/apartment Stairs: No Has following equipment at home: Single point cane, Environmental Consultant - 2 wheeled, and shower chair  OCCUPATION: Retired  PLOF: Independent  PATIENT GOALS: improve pain, get back to walking without anything  NEXT MD VISIT: 02/07/22  OBJECTIVE:  Note: Objective measures were completed at Evaluation unless otherwise noted.  PATIENT SURVEYS:  LEFS 42/80*100 = 52.5%  COGNITION: Overall cognitive status: Within functional limits for tasks assessed     S LOWER EXTREMITY ROM:  Active ROM Right eval Left eval Left 01/14/23 Left 01/23/23  Hip flexion      Hip extension      Hip abduction      Hip adduction      Hip internal rotation       Hip external rotation      Knee flexion 123 100 108 119  Knee extension 0 Lacks 15 deg Lacks 10 deg Lacks 10 deg  Ankle dorsiflexion      Ankle plantarflexion      Ankle inversion      Ankle eversion       (Blank rows = not tested)  LOWER EXTREMITY MMT:  MMT Right eval Left eval  Hip flexion    Hip extension    Hip abduction    Hip adduction    Hip internal rotation    Hip external rotation    Knee flexion    Knee extension    Ankle dorsiflexion    Ankle plantarflexion    Ankle inversion    Ankle eversion     (Blank rows = not tested)    FUNCTIONAL TESTS:  5 times sit to stand: 18 sec with use of bil UE, decrease WB tolerance noted on L LE 10 meter walk test: 0.67 m/s with walker    TODAY'S TREATMENT:  DATE: 01/30/2023 Supine quad sets: 20 x 5 holds Supine SLR: 3 x 10 Supine bridge: 2 x 10 Supine heel slides: 2 x 10 SL hip abduction: 3 x 10 L only Seated LAQ: 3lbs  3 x 10 L only Seated HS curls: green band: 3 x 10 L only Bil leg press: 60 lbs 2 x 10 Standing gastroc stretch: slant board: 2 x 45 Fwd step ups: 4 10x R and L, bil HHA Sci Fit: level 3 for 10'      PATIENT EDUCATION:  Education details: see above Person educated: Patient Education method: Explanation Education comprehension: verbalized understanding  HOME EXERCISE PROGRAM: Access Code: [email or web address] URL: [email or web address] Date: 01/07/2023 Prepared by: Raj Blanch  Exercises - Supine Heel Slide  - 3 x daily - 7 x weekly - 10 reps - 5 sec hold - Long Sitting Quad Set with Towel Roll Under Heel  - 3 x daily - 7 x weekly - 2 sets - 10 reps - Sit to Stand with Armchair  - 1 x daily - 7 x weekly - 10 reps  ASSESSMENT:  CLINICAL IMPRESSION: Today's skilled session focused continued progression of her knee mobility and strengthening. .   OBJECTIVE  IMPAIRMENTS: Abnormal gait, decreased activity tolerance, decreased balance, decreased endurance, decreased mobility, difficulty walking, decreased ROM, decreased strength, hypomobility, increased edema, increased muscle spasms, impaired flexibility, and pain.    GOALS: Goals reviewed with patient? Yes  SHORT TERM GOALS: Target date: 02/04/2023   Pt will demo -5 to 110 deg of L knee AROM to improve knee ROM with functional transfers, bed mobility and transfers Baseline: -15 to 100 deg; -10 to 119 (01/23/23) Goal status: Progressing  2.  PT will demo at least 50% compliance with HEP to self manage symptoms Baseline: issued on 01/07/23; pt reports 75% compliance (01/23/23) Goal status: goal met  3.   LONG TERM GOALS: Target date: 03/04/2023    Patient will demo 0-120 deg of L knee AROM to improve squatting, functional transfers and car transfers Baseline: -15 to 100 deg  Goal status: INITIAL  2.  Pt will demo gait speed of >0.80 m/s without AD to improve gait quality, improve community ambulation. Baseline: 0.67 m/s with RW (01/07/23) Goal status: INITIAL  3.  Pt will report at least 9 points improvement on LEFS to improve overall function. Baseline: 42/80 (01/07/23) Goal status: INITIAL  4.  Pt will be able to perform 5x sit to stand without use of bil UE to improve functional strength with transfers in under 18 seconds Baseline: 18 sec WITH use of bil UE (01/07/23) Goal status: INITIAL  5.  Pt will report of pain of <3/10 at worst in L knee with daily functioning. Baseline: 7/10 (01/07/23) Goal status: INITIAL     PLAN:  PT FREQUENCY: 2x/week  PT DURATION: 8 weeks  PLANNED INTERVENTIONS: [id number]- PT Re-evaluation, 97110-Therapeutic exercises, 97530- Therapeutic activity, 97112- Neuromuscular re-education, 97535- Self Care, [id number]- Manual therapy, [phone]- Gait training, Patient/Family education, Balance training, Stair training, Taping, Dry Needling, Joint mobilization, Scar  mobilization, Cryotherapy, Moist heat, and Manual therapy  PLAN FOR NEXT SESSION:progress HEP as tolerated, initiate fwd/bwd walking at countertop with one UE support   Raj LOISE Blanch, PT 01/30/2023, 11:13 AM

## 2023-02-01 ENCOUNTER — Encounter (HOSPITAL_COMMUNITY): Payer: Self-pay | Admitting: Emergency Medicine

## 2023-02-01 ENCOUNTER — Ambulatory Visit (HOSPITAL_COMMUNITY)
Admission: EM | Admit: 2023-02-01 | Discharge: 2023-02-01 | Disposition: A | Discharge status: Home / Self Care | Payer: 59

## 2023-02-01 DIAGNOSIS — I10 Essential (primary) hypertension: Secondary | ICD-10-CM

## 2023-02-01 DIAGNOSIS — B338 Other specified viral diseases: Secondary | ICD-10-CM

## 2023-02-01 NOTE — ED Triage Notes (Signed)
 Pt c/o cough, congestion for 3 days. Reports also had n/v/d and was seen at [facility] 1/1 but "they didn't know what was going on with me and the medications they gave me didn't help".

## 2023-02-01 NOTE — ED Provider Notes (Addendum)
 [facility]    CSN: [id number] Arrival date & time: 02/01/23  1011      History   Chief Complaint Chief Complaint  Patient presents with   Cough   Nasal Congestion   Emesis   Diarrhea    HPI [name] is a 73 y.o. female.   Patient here for evalation of nasal congestion, rhinorrhea, PND, and cough.  She tested positive for RSV at [facility] ED 4 days ago (ED note and lab results reviewed).  She denies f/c, n/v/d, wheezing, SOB.  She was given ondansetron  which she is taking and hasn't had any episodes of vomiting.      Past Medical History:  Diagnosis Date   Anemia    a. Noted on 07/2012 labs, instructed to f/u PCP.   Arthritis    joints (11/18/2012)   CAD (coronary artery disease), native coronary artery    a. Nonobstructive by cath 02/2012 (done because of low EF).   Chronic bronchitis (HCC)    ~ every other year (11/18/2012)   Chronic combined systolic and diastolic CHF (congestive heart failure) (HCC)    a. 03/05/12 echo:  LVEF 20-25%, moderate LVH , inferior and basal to mid septal akinesis, anterior moderate to severe hypokinesis and grade 2 diastolic dysfunction. b. EF 07/2012: EF still 25% (unclear medication compliance).   Chronic lower back pain    Headache(784.0)    often; maybe not daily (11/18/2012)   High cholesterol    History of noncompliance with medical treatment    Hypertension    LBBB (left bundle branch block)    Orthopnea    Pneumonia    Tobacco abuse    Type II diabetes mellitus [facility]     Patient Active Problem List   Diagnosis Date Noted   Degenerative arthritis of left knee 12/17/2022   Right foot infection 10/22/2017   Cellulitis in diabetic foot (HCC) 10/22/2017   Hyperglycemia 10/22/2017   Lumbar radiculopathy 09/18/2017   Degenerative spondylolisthesis 09/18/2017   Atrial fibrillation (HCC) 02/11/2017   Paroxysmal A-fib (HCC)    Biventricular automatic implantable cardioverter defibrillator in situ    Sepsis (HCC)  04/20/2016   UTI (urinary tract infection) 04/20/2016   Dyspnea 07/19/2015   Interstitial lung disease (HCC) 11/20/2014   SIRS (systemic inflammatory response syndrome) (HCC) 02/03/2014   IDDM (insulin  dependent diabetes mellitus) 02/03/2014   Tachycardia 06/14/2013   Chronic systolic CHF (congestive heart failure) (HCC) 09/29/2012   Hypoxia 03/06/2012   Hypertensive heart disease 03/06/2012   Nonischemic cardiomyopathy (HCC) 03/06/2012   Tobacco abuse 03/06/2012   Type 2 diabetes mellitus (HCC) 03/06/2012   Hypertension 03/06/2012   CAD (coronary artery disease), native coronary artery 03/06/2012   Hypokalemia 03/06/2012   Acute bronchitis 02/01/2009   Sleep apnea 02/01/2009   Chest pain 02/01/2009    Past Surgical History:  Procedure Laterality Date   BI-VENTRICULAR IMPLANTABLE CARDIOVERTER DEFIBRILLATOR N/A 11/18/2012   Procedure: BI-VENTRICULAR IMPLANTABLE CARDIOVERTER DEFIBRILLATOR  (CRT-D);  Surgeon: Danelle LELON Birmingham, MD;  Location: [facility] CATH LAB;  Service: Cardiovascular;  Laterality: N/A;   BI-VENTRICULAR IMPLANTABLE CARDIOVERTER DEFIBRILLATOR  (CRT-D)  11/18/2012   BIV ICD GENERATOR CHANGEOUT N/A 11/22/2020   Procedure: BIV ICD GENERATOR CHANGEOUT;  Surgeon: Birmingham Danelle LELON, MD;  Location: [facility] INVASIVE CV LAB;  Service: Cardiovascular;  Laterality: N/A;   CARDIAC CATHETERIZATION  03/04/12   nonobstructive CAD, elevated LVEDP and tortuous vessels suggestive of long-standing hypertension   COLONOSCOPY WITH PROPOFOL  N/A 12/12/2016   Procedure: COLONOSCOPY WITH PROPOFOL ;  Surgeon: Rollin Dover, MD;  Location: [facility];  Service: Endoscopy;  Laterality: N/A;   JOINT REPLACEMENT     Bilateral hip and right knee   LEFT HEART CATH N/A 03/05/2012   Procedure: LEFT HEART CATH;  Surgeon: Ezra GORMAN Shuck, MD;  Location[facility] CATH LAB;  Service: Cardiovascular;  Laterality: N/A;   TOTAL KNEE ARTHROPLASTY Left 12/22/2022   Procedure: LEFT TOTAL KNEE ARTHROPLASTY;  Surgeon: Liam Lerner, MD;   Location: [facility];  Service: Orthopedics;  Laterality: Left;    OB History   No obstetric history on file.      Home Medications    Prior to Admission medications   Medication Sig Start Date End Date Taking? Authorizing Provider  apixaban  (ELIQUIS ) 2.5 MG TABS tablet Take 1 tablet (2.5 mg total) by mouth 2 (two) times daily. 12/22/22   Liam Lerner, MD  gabapentin  (NEURONTIN ) 100 MG capsule Take 100 mg by mouth 3 (three) times daily. 03/10/18   [provider]  glucose blood (FREESTYLE TEST STRIPS) test strip Use as instructed 04/25/16   Ghimire, Donalda HERO, MD  glucose monitoring kit (FREESTYLE) monitoring kit 1 each by Does not apply route 4 (four) times daily - after meals and at bedtime. 1 month Diabetic Testing Supplies for QAC-QHS accuchecks. 04/25/16   Ghimire, Donalda HERO, MD  HUMALOG KWIKPEN 100 UNIT/ML KwikPen Inject 20 Units into the skin 2 (two) times daily. Sliding scale 02/12/18   [provider]  insulin  glargine (LANTUS ) 100 UNIT/ML Solostar Pen Inject 50 Units into the skin at bedtime.    [provider]  Insulin  Pen Needle 32G X 8 MM MISC Use as directed 06/18/16   Claudene Rocky BRAVO, NP  isosorbide -hydrALAZINE  (BIDIL ) 20-37.5 MG tablet Take 2 tablets by mouth 3 (three) times daily. 09/04/22   [provider]  Lancets (FREESTYLE) lancets Use as instructed 06/18/16   Smith, Erin E, NP  ondansetron  (ZOFRAN -ODT) 4 MG disintegrating tablet Take 1 tablet (4 mg total) by mouth every 8 (eight) hours as needed for nausea or vomiting. 01/28/23   Dreama Rocky, MD  potassium chloride  SA (KLOR-CON  M) 20 MEQ tablet Take 2 tablets today after returning home (40meq), then 20meq tonight before bed, then for 2 days take 20meq twice a day. 01/28/23   Dreama Rocky, MD  Semaglutide,0.25 or 0.5MG /DOS, (OZEMPIC, 0.25 OR 0.5 MG/DOSE,) 2 MG/1.5ML SOPN Inject 0.5 mg into the skin every Wednesday.    [provider]  Tafamidis  (VYNDAMAX ) 61 MG CAPS TAKE 1 CAPSULE (61  MG) BY MOUTH DAILY. 05/09/22   Shuck Ezra GORMAN, MD  tiZANidine  (ZANAFLEX ) 2 MG tablet Take 1 tablet (2 mg total) by mouth 3 (three) times daily. 12/22/22   Liam Lerner, MD  torsemide  (DEMADEX ) 100 MG tablet Take 150 mg by mouth daily. 10/16/22   [provider]    Family History Family History  Problem Relation Age of Onset   Heart failure Mother    Heart disease Neg Hx     Social History Social History   Tobacco Use   Smoking status: Former    Current packs/day: 0.00    Average packs/day: 1 pack/day for 40.0 years (40.0 ttl pk-yrs)    Types: Cigarettes    Start date: 06/23/1972    Quit date: 03/26/2012    Years since quitting: 10.8   Smokeless tobacco: Never  Vaping Use   Vaping status: Never Used  Substance Use Topics   Alcohol use: No   Drug use: No  Allergies   Morphine and codeine and Penicillins   Review of Systems Review of Systems  Constitutional:  Negative for chills, fatigue and fever.  HENT:  Positive for congestion, postnasal drip and rhinorrhea. Negative for ear pain, nosebleeds, sinus pressure, sinus pain and sore throat.   Eyes:  Negative for pain and redness.  Respiratory:  Positive for cough. Negative for shortness of breath and wheezing.   Gastrointestinal:  Negative for abdominal pain, diarrhea, nausea and vomiting.  Musculoskeletal:  Negative for arthralgias and myalgias.  Skin:  Negative for rash.  Neurological:  Negative for light-headedness and headaches.  Hematological:  Negative for adenopathy. Does not bruise/bleed easily.  Psychiatric/Behavioral:  Negative for confusion and sleep disturbance.      Physical Exam Triage Vital Signs ED Triage Vitals [02/01/23 1112]  Encounter Vitals Group     BP (!) 159/87     Systolic BP Percentile      Diastolic BP Percentile      Pulse Rate 91     Resp 18     Temp 98.5 F (36.9 C)     Temp Source Oral     SpO2 97 %     Weight      Height      Head Circumference      Peak Flow       Pain Score 0     Pain Loc      Pain Education      Exclude from Growth Chart    No data found.  Updated Vital Signs BP (!) 159/87 (BP Location: Left Arm)   Pulse 91   Temp 98.5 F (36.9 C) (Oral)   Resp 18   SpO2 97%   Visual Acuity Right Eye Distance:   Left Eye Distance:   Bilateral Distance:    Right Eye Near:   Left Eye Near:    Bilateral Near:     Physical Exam Vitals and nursing note reviewed.  Constitutional:      General: She is not in acute distress.    Appearance: Normal appearance. She is not ill-appearing.  HENT:     Head: Normocephalic and atraumatic.     Right Ear: Tympanic membrane and ear canal normal.     Left Ear: Tympanic membrane and ear canal normal.     Nose: Mucosal edema, congestion and rhinorrhea present. Rhinorrhea is clear.     Mouth/Throat:     Pharynx: No oropharyngeal exudate or posterior oropharyngeal erythema.  Eyes:     General: No scleral icterus.    Extraocular Movements: Extraocular movements intact.     Conjunctiva/sclera: Conjunctivae normal.  Cardiovascular:     Rate and Rhythm: Normal rate and regular rhythm.     Heart sounds: No murmur heard. Pulmonary:     Effort: Pulmonary effort is normal. No respiratory distress.     Breath sounds: Normal breath sounds. No wheezing or rales.  Musculoskeletal:     Cervical back: Normal range of motion. No rigidity.  Skin:    Capillary Refill: Capillary refill takes less than 2 seconds.     Coloration: Skin is not jaundiced.     Findings: No rash.  Neurological:     General: No focal deficit present.     Mental Status: She is alert and oriented to person, place, and time.     Motor: No weakness.     Gait: Gait normal.  Psychiatric:        Mood and Affect: Mood normal.  Behavior: Behavior normal.      UC Treatments / Results  Labs (all labs ordered are listed, but only abnormal results are displayed) Admission on 01/28/2023, Discharged on 01/28/2023  Component Date  Value Ref Range Status   WBC 01/28/2023 7.1  4.0 - 10.5 K/uL Final   RBC 01/28/2023 3.07 (L)  3.87 - 5.11 MIL/uL Final   Hemoglobin 01/28/2023 9.4 (L)  12.0 - 15.0 g/dL Final   HCT 98/98/7974 28.8 (L)  36.0 - 46.0 % Final   MCV 01/28/2023 93.8  80.0 - 100.0 fL Final   MCH 01/28/2023 30.6  26.0 - 34.0 pg Final   MCHC 01/28/2023 32.6  30.0 - 36.0 g/dL Final   RDW 98/98/7974 12.8  11.5 - 15.5 % Final   Platelets 01/28/2023 377  150 - 400 K/uL Final   nRBC 01/28/2023 0.0  0.0 - 0.2 % Final   Neutrophils Relative % 01/28/2023 64  % Final   Neutro Abs 01/28/2023 4.6  1.7 - 7.7 K/uL Final   Lymphocytes Relative 01/28/2023 20  % Final   Lymphs Abs 01/28/2023 1.4  0.7 - 4.0 K/uL Final   Monocytes Relative 01/28/2023 10  % Final   Monocytes Absolute 01/28/2023 0.7  0.1 - 1.0 K/uL Final   Eosinophils Relative 01/28/2023 5  % Final   Eosinophils Absolute 01/28/2023 0.3  0.0 - 0.5 K/uL Final   Basophils Relative 01/28/2023 1  % Final   Basophils Absolute 01/28/2023 0.1  0.0 - 0.1 K/uL Final   Immature Granulocytes 01/28/2023 0  % Final   Abs Immature Granulocytes 01/28/2023 0.01  0.00 - 0.07 K/uL Final   Performed at [facility], [address] [place], [place] [address]   Sodium 01/28/2023 136  135 - 145 mmol/L Final   Potassium 01/28/2023 2.8 (L)  3.5 - 5.1 mmol/L Final   Chloride 01/28/2023 103  98 - 111 mmol/L Final   CO2 01/28/2023 25  22 - 32 mmol/L Final   Glucose, Bld 01/28/2023 106 (H)  70 - 99 mg/dL Final   Glucose reference range applies only to samples taken after fasting for at least 8 hours.   BUN 01/28/2023 19  8 - 23 mg/dL Final   Creatinine, Ser 01/28/2023 1.29 (H)  0.44 - 1.00 mg/dL Final   Calcium  01/28/2023 8.3 (L)  8.9 - 10.3 mg/dL Final   Total Protein 98/98/7974 7.2  6.5 - 8.1 g/dL Final   Albumin  01/28/2023 3.6  3.5 - 5.0 g/dL Final   AST 98/98/7974 13 (L)  15 - 41 U/L Final   ALT 01/28/2023 7  0 - 44 U/L Final   Alkaline Phosphatase 01/28/2023 59   38 - 126 U/L Final   Total Bilirubin 01/28/2023 0.4  0.0 - 1.2 mg/dL Final   GFR, Estimated 01/28/2023 45 (L)  >60 mL/min Final   Comment: (NOTE) Calculated using the CKD-EPI Creatinine Equation (2021)    Anion gap 01/28/2023 8  5 - 15 Final   Performed at [facility], [address] [place], [place] [address]   Lipase 01/28/2023 29  11 - 51 U/L Final   Performed at [facility], [address] [place], [place] [address]   SARS Coronavirus 2 by RT PCR 01/28/2023 NEGATIVE  NEGATIVE Final   Comment: (NOTE) SARS-CoV-2 target nucleic acids are NOT DETECTED.  The SARS-CoV-2 RNA is generally detectable in upper respiratory specimens during the acute phase of infection. The lowest concentration of SARS-CoV-2 viral copies  this assay can detect is 138 copies/mL. A negative result does not preclude SARS-Cov-2 infection and should not be used as the sole basis for treatment or other patient management decisions. A negative result may occur with  improper specimen collection/handling, submission of specimen other than nasopharyngeal swab, presence of viral mutation(s) within the areas targeted by this assay, and inadequate number of viral copies(<138 copies/mL). A negative result must be combined with clinical observations, patient history, and epidemiological information. The expected result is Negative.  Fact Sheet for Patients:  bloggercourse.com  Fact Sheet for Healthcare Providers:  seriousbroker.it  This test is no                          t yet approved or cleared by the [place]  FDA and  has been authorized for detection and/or diagnosis of SARS-CoV-2 by FDA under an Emergency Use Authorization (EUA). This EUA will remain  in effect (meaning this test can be used) for the duration of the COVID-19 declaration under Section 564(b)(1) of the Act, 21 U.S.C.section 360bbb-3(b)(1), unless the  authorization is terminated  or revoked sooner.       Influenza A by PCR 01/28/2023 NEGATIVE  NEGATIVE Final   Influenza B by PCR 01/28/2023 NEGATIVE  NEGATIVE Final   Comment: (NOTE) The Xpert Xpress SARS-CoV-2/FLU/RSV plus assay is intended as an aid in the diagnosis of influenza from Nasopharyngeal swab specimens and should not be used as a sole basis for treatment. Nasal washings and aspirates are unacceptable for Xpert Xpress SARS-CoV-2/FLU/RSV testing.  Fact Sheet for Patients: bloggercourse.com  Fact Sheet for Healthcare Providers: seriousbroker.it  This test is not yet approved or cleared by the [place]  FDA and has been authorized for detection and/or diagnosis of SARS-CoV-2 by FDA under an Emergency Use Authorization (EUA). This EUA will remain in effect (meaning this test can be used) for the duration of the COVID-19 declaration under Section 564(b)(1) of the Act, 21 U.S.C. section 360bbb-3(b)(1), unless the authorization is terminated or revoked.     Resp Syncytial Virus by PCR 01/28/2023 POSITIVE (A)  NEGATIVE Final   Comment: (NOTE) Fact Sheet for Patients: bloggercourse.com  Fact Sheet for Healthcare Providers: seriousbroker.it  This test is not yet approved or cleared by the [place]  FDA and has been authorized for detection and/or diagnosis of SARS-CoV-2 by FDA under an Emergency Use Authorization (EUA). This EUA will remain in effect (meaning this test can be used) for the duration of the COVID-19 declaration under Section 564(b)(1) of the Act, 21 U.S.C. section 360bbb-3(b)(1), unless the authorization is terminated or revoked.  Performed at [facility], [address], [place], [place] [address]    Fecal Occult Bld 01/28/2023 NEGATIVE  NEGATIVE Final     EKG   Radiology No results found.  Procedures Procedures  (including critical care time)  Medications Ordered in UC Medications - No data to display  Initial Impression / Assessment and Plan / UC Course  I have reviewed the triage vital signs and the nursing notes.  Pertinent labs & imaging results that were available during my care of the patient were reviewed by me and considered in my medical decision making (see chart for details).     Positive for RSV at ED 4 days ago, continues to have RSV sx Discussed expected course and duration of symptoms, ED precautions provided Drink plenty of water , get plenty of rest  Blood pressure elevated today, follow up with  PCP Final Clinical Impressions(s) / UC Diagnoses   Final diagnoses:  Hypertension, unspecified type  RSV (respiratory syncytial virus infection)     Discharge Instructions      Drink plenty of water  Get plenty of rest Monitor blood pressure, follow up with PCP for management of high blood presure    ED Prescriptions   None    PDMP not reviewed this encounter.   Juleen Rush, PA-C 02/01/23 1135    Juleen Rush, PA-C 02/01/23 1136

## 2023-02-01 NOTE — Discharge Instructions (Addendum)
 Drink plenty of water Get plenty of rest Monitor blood pressure, follow up with PCP for management of high blood presure

## 2023-02-02 ENCOUNTER — Ambulatory Visit: Payer: 59

## 2023-02-04 ENCOUNTER — Ambulatory Visit: Payer: 59

## 2023-02-04 DIAGNOSIS — J069 Acute upper respiratory infection, unspecified: Secondary | ICD-10-CM | POA: Diagnosis not present

## 2023-02-04 DIAGNOSIS — I5020 Unspecified systolic (congestive) heart failure: Secondary | ICD-10-CM | POA: Diagnosis not present

## 2023-02-04 DIAGNOSIS — E1165 Type 2 diabetes mellitus with hyperglycemia: Secondary | ICD-10-CM | POA: Diagnosis not present

## 2023-02-04 DIAGNOSIS — I4891 Unspecified atrial fibrillation: Secondary | ICD-10-CM | POA: Diagnosis not present

## 2023-02-04 DIAGNOSIS — G8332 Monoplegia, unspecified affecting left dominant side: Secondary | ICD-10-CM | POA: Diagnosis not present

## 2023-02-04 DIAGNOSIS — I110 Hypertensive heart disease with heart failure: Secondary | ICD-10-CM | POA: Diagnosis not present

## 2023-02-04 DIAGNOSIS — D6869 Other thrombophilia: Secondary | ICD-10-CM | POA: Diagnosis not present

## 2023-02-04 DIAGNOSIS — E854 Organ-limited amyloidosis: Secondary | ICD-10-CM | POA: Diagnosis not present

## 2023-02-04 DIAGNOSIS — Z7901 Long term (current) use of anticoagulants: Secondary | ICD-10-CM | POA: Diagnosis not present

## 2023-02-04 DIAGNOSIS — I428 Other cardiomyopathies: Secondary | ICD-10-CM | POA: Diagnosis not present

## 2023-02-04 DIAGNOSIS — I480 Paroxysmal atrial fibrillation: Secondary | ICD-10-CM | POA: Diagnosis not present

## 2023-02-05 ENCOUNTER — Other Ambulatory Visit (HOSPITAL_COMMUNITY): Payer: Self-pay

## 2023-02-06 ENCOUNTER — Ambulatory Visit: Payer: 59

## 2023-02-06 DIAGNOSIS — M25562 Pain in left knee: Secondary | ICD-10-CM

## 2023-02-06 DIAGNOSIS — R262 Difficulty in walking, not elsewhere classified: Secondary | ICD-10-CM

## 2023-02-06 DIAGNOSIS — M6281 Muscle weakness (generalized): Secondary | ICD-10-CM | POA: Diagnosis not present

## 2023-02-06 NOTE — Therapy (Signed)
 OUTPATIENT PHYSICAL THERAPY LOWER EXTREMITY TREATMENT   Patient Name: [name] MRN: [id number] DOB:Aug 12, 1950, 73 y.o., female Today's Date: 02/06/2023  END OF SESSION:  PT End of Session - 02/06/23 1105     Visit Number 6    Number of Visits 10    Date for PT Re-Evaluation 03/04/23    Authorization Type UHC Medicare/medicaid    PT Start Time 1105    PT Stop Time 1145    PT Time Calculation (min) 40 min    Equipment Utilized During Treatment Gait belt    Activity Tolerance Patient tolerated treatment well    Behavior During Therapy WFL for tasks assessed/performed               Past Medical History:  Diagnosis Date   Anemia    a. Noted on 07/2012 labs, instructed to f/u PCP.   Arthritis    joints (11/18/2012)   CAD (coronary artery disease), native coronary artery    a. Nonobstructive by cath 02/2012 (done because of low EF).   Chronic bronchitis (HCC)    ~ every other year (11/18/2012)   Chronic combined systolic and diastolic CHF (congestive heart failure) (HCC)    a. 03/05/12 echo:  LVEF 20-25%, moderate LVH , inferior and basal to mid septal akinesis, anterior moderate to severe hypokinesis and grade 2 diastolic dysfunction. b. EF 07/2012: EF still 25% (unclear medication compliance).   Chronic lower back pain    Headache(784.0)    often; maybe not daily (11/18/2012)   High cholesterol    History of noncompliance with medical treatment    Hypertension    LBBB (left bundle branch block)    Orthopnea    Pneumonia    Tobacco abuse    Type II diabetes mellitus (HCC)    Past Surgical History:  Procedure Laterality Date   BI-VENTRICULAR IMPLANTABLE CARDIOVERTER DEFIBRILLATOR N/A 11/18/2012   Procedure: BI-VENTRICULAR IMPLANTABLE CARDIOVERTER DEFIBRILLATOR  (CRT-D);  Surgeon: Danelle LELON Birmingham, MD;  Location: [facility] CATH LAB;  Service: Cardiovascular;  Laterality: N/A;   BI-VENTRICULAR IMPLANTABLE CARDIOVERTER DEFIBRILLATOR  (CRT-D)  11/18/2012   BIV ICD  GENERATOR CHANGEOUT N/A 11/22/2020   Procedure: BIV ICD GENERATOR CHANGEOUT;  Surgeon: Birmingham Danelle LELON, MD;  Location: [facility] INVASIVE CV LAB;  Service: Cardiovascular;  Laterality: N/A;   CARDIAC CATHETERIZATION  03/04/12   nonobstructive CAD, elevated LVEDP and tortuous vessels suggestive of long-standing hypertension   COLONOSCOPY WITH PROPOFOL  N/A 12/12/2016   Procedure: COLONOSCOPY WITH PROPOFOL ;  Surgeon: Rollin Dover, MD;  Location: [facility] ENDOSCOPY;  Service: Endoscopy;  Laterality: N/A;   JOINT REPLACEMENT     Bilateral hip and right knee   LEFT HEART CATH N/A 03/05/2012   Procedure: LEFT HEART CATH;  Surgeon: Ezra GORMAN Shuck, MD;  Location: [facility] CATH LAB;  Service: Cardiovascular;  Laterality: N/A;   TOTAL KNEE ARTHROPLASTY Left 12/22/2022   Procedure: LEFT TOTAL KNEE ARTHROPLASTY;  Surgeon: Liam Lerner, MD;  Location: [facility] ORS;  Service: Orthopedics;  Laterality: Left;   Patient Active Problem List   Diagnosis Date Noted   Degenerative arthritis of left knee 12/17/2022   Right foot infection 10/22/2017   Cellulitis in diabetic foot (HCC) 10/22/2017   Hyperglycemia 10/22/2017   Lumbar radiculopathy 09/18/2017   Degenerative spondylolisthesis 09/18/2017   Atrial fibrillation (HCC) 02/11/2017   Paroxysmal A-fib (HCC)    Biventricular automatic implantable cardioverter defibrillator in situ    Sepsis (HCC) 04/20/2016   UTI (urinary tract infection) 04/20/2016   Dyspnea 07/19/2015  Interstitial lung disease (HCC) 11/20/2014   SIRS (systemic inflammatory response syndrome) (HCC) 02/03/2014   IDDM (insulin  dependent diabetes mellitus) 02/03/2014   Tachycardia 06/14/2013   Chronic systolic CHF (congestive heart failure) (HCC) 09/29/2012   Hypoxia 03/06/2012   Hypertensive heart disease 03/06/2012   Nonischemic cardiomyopathy (HCC) 03/06/2012   Tobacco abuse 03/06/2012   Type 2 diabetes mellitus (HCC) 03/06/2012   Hypertension 03/06/2012   CAD (coronary artery disease), native coronary  artery 03/06/2012   Hypokalemia 03/06/2012   Acute bronchitis 02/01/2009   Sleep apnea 02/01/2009   Chest pain 02/01/2009    PCP: Dr. Sula Sigrid Kays  REFERRING PROVIDER: Liam Lerner, MD  REFERRING DIAG: [phone] (ICD-10-CM) - Unilateral primary osteoarthritis, left knee  THERAPY DIAG:  Muscle weakness (generalized)  Difficulty in walking, not elsewhere classified  Acute pain of left knee  Rationale for Evaluation and Treatment: Rehabilitation  ONSET DATE: 11/26/2022- date of referral; 12/22/22 L TKA  SUBJECTIVE:   SUBJECTIVE STATEMENT: Pt reports everything is going well. Not really feeling any pain with daily activities or walking. The swelling in the knee has gone down within last week.  PERTINENT HISTORY: Pt with hx including but not limited to arthritis, DM, hyperglycemia, lumbar radiculopathy, afib, CAD, sleep apnea, cardiomyopathy, bil THA, R TKA  PAIN:  PAIN:  Are you having pain? No   PRECAUTIONS: ICD/Pacemaker  RED FLAGS: None   WEIGHT BEARING RESTRICTIONS: No  FALLS:  Has patient fallen in last 6 months? No  LIVING ENVIRONMENT: Lives with: lives alone Lives in: House/apartment Stairs: No Has following equipment at home: Single point cane, Environmental Consultant - 2 wheeled, and shower chair  OCCUPATION: Retired  PLOF: Independent  PATIENT GOALS: improve pain, get back to walking without anything  NEXT MD VISIT: 02/07/22  OBJECTIVE:  Note: Objective measures were completed at Evaluation unless otherwise noted.  PATIENT SURVEYS:  LEFS 42/80*100 = 52.5%  COGNITION: Overall cognitive status: Within functional limits for tasks assessed     S LOWER EXTREMITY ROM:  Active ROM Right eval Left eval Left 01/14/23 Left 01/23/23  Hip flexion      Hip extension      Hip abduction      Hip adduction      Hip internal rotation      Hip external rotation      Knee flexion 123 100 108 119  Knee extension 0 Lacks 15 deg Lacks 10 deg Lacks 10 deg   Ankle dorsiflexion      Ankle plantarflexion      Ankle inversion      Ankle eversion       (Blank rows = not tested)  LOWER EXTREMITY MMT:  MMT Right eval Left eval  Hip flexion    Hip extension    Hip abduction    Hip adduction    Hip internal rotation    Hip external rotation    Knee flexion    Knee extension    Ankle dorsiflexion    Ankle plantarflexion    Ankle inversion    Ankle eversion     (Blank rows = not tested)    FUNCTIONAL TESTS:  5 times sit to stand: 18 sec with use of bil UE, decrease WB tolerance noted on L LE 10 meter walk test: 0.67 m/s with walker    TODAY'S TREATMENT:  DATE: 02/06/2023 Sci Fit: level 5 for 10' Bil leg press: 70 lbs 2 x 10 Standing gastroc stretch: slant board: 3 x 45 Standing airex foam: narrow BOS: EC x 30 (unable to maintain >5 sec), wide BOS EC: 2 x 30  Standing marching: on foam: 20x intermittent HHA Fwd step ups: 4 10x R and L, bil HHA Standing box taps: 8 box:  Sci Fit: level 3 for 10'      PATIENT EDUCATION:  Education details: see above Person educated: Patient Education method: Explanation Education comprehension: verbalized understanding  HOME EXERCISE PROGRAM: Access Code: [email or web address] URL: [email or web address] Date: 01/07/2023 Prepared by: Raj Blanch  Exercises - Supine Heel Slide  - 3 x daily - 7 x weekly - 10 reps - 5 sec hold - Long Sitting Quad Set with Towel Roll Under Heel  - 3 x daily - 7 x weekly - 2 sets - 10 reps - Sit to Stand with Armchair  - 1 x daily - 7 x weekly - 10 reps  ASSESSMENT:  CLINICAL IMPRESSION: Today's skilled session focused continued progression of her knee mobility and strengthening. Swelling and edema has significantly improved in L knee and patient is reporting minimal to no pain with daily function. STG met today  OBJECTIVE  IMPAIRMENTS: Abnormal gait, decreased activity tolerance, decreased balance, decreased endurance, decreased mobility, difficulty walking, decreased ROM, decreased strength, hypomobility, increased edema, increased muscle spasms, impaired flexibility, and pain.    GOALS: Goals reviewed with patient? Yes  SHORT TERM GOALS: Target date: 02/04/2023   Pt will demo -5 to 110 deg of L knee AROM to improve knee ROM with functional transfers, bed mobility and transfers Baseline: -15 to 100 deg; -10 to 119 (01/23/23); -7-118 deg (02/06/23) Goal status: Goal met  2.  PT will demo at least 50% compliance with HEP to self manage symptoms Baseline: issued on 01/07/23; pt reports 75% compliance (01/23/23) Goal status: goal met  3.   LONG TERM GOALS: Target date: 03/04/2023    Patient will demo 0-120 deg of L knee AROM to improve squatting, functional transfers and car transfers Baseline: -15 to 100 deg  Goal status: INITIAL  2.  Pt will demo gait speed of >0.80 m/s without AD to improve gait quality, improve community ambulation. Baseline: 0.67 m/s with RW (01/07/23) Goal status: INITIAL  3.  Pt will report at least 9 points improvement on LEFS to improve overall function. Baseline: 42/80 (01/07/23) Goal status: INITIAL  4.  Pt will be able to perform 5x sit to stand without use of bil UE to improve functional strength with transfers in under 18 seconds Baseline: 18 sec WITH use of bil UE (01/07/23) Goal status: INITIAL  5.  Pt will report of pain of <3/10 at worst in L knee with daily functioning. Baseline: 7/10 (01/07/23) Goal status: INITIAL     PLAN:  PT FREQUENCY: 2x/week  PT DURATION: 8 weeks  PLANNED INTERVENTIONS: 97164- PT Re-evaluation, 97110-Therapeutic exercises, 97530- Therapeutic activity, 97112- Neuromuscular re-education, 97535- Self Care, [id number]- Manual therapy, [phone]- Gait training, Patient/Family education, Balance training, Stair training, Taping, Dry Needling, Joint  mobilization, Scar mobilization, Cryotherapy, Moist heat, and Manual therapy  PLAN FOR NEXT SESSION:progress HEP as tolerated, initiate fwd/bwd walking at countertop with one UE support   Raj LOISE Blanch, PT 02/06/2023, 11:06 AM

## 2023-02-09 ENCOUNTER — Ambulatory Visit: Payer: 59

## 2023-02-10 DIAGNOSIS — R202 Paresthesia of skin: Secondary | ICD-10-CM | POA: Diagnosis not present

## 2023-02-10 DIAGNOSIS — Z5181 Encounter for therapeutic drug level monitoring: Secondary | ICD-10-CM | POA: Diagnosis not present

## 2023-02-10 DIAGNOSIS — Z79891 Long term (current) use of opiate analgesic: Secondary | ICD-10-CM | POA: Diagnosis not present

## 2023-02-10 DIAGNOSIS — I10 Essential (primary) hypertension: Secondary | ICD-10-CM | POA: Diagnosis not present

## 2023-02-10 DIAGNOSIS — G894 Chronic pain syndrome: Secondary | ICD-10-CM | POA: Diagnosis not present

## 2023-02-10 DIAGNOSIS — M5030 Other cervical disc degeneration, unspecified cervical region: Secondary | ICD-10-CM | POA: Diagnosis not present

## 2023-02-13 ENCOUNTER — Ambulatory Visit: Payer: 59

## 2023-02-16 ENCOUNTER — Ambulatory Visit: Payer: 59 | Admitting: Physical Therapy

## 2023-02-16 ENCOUNTER — Encounter (INDEPENDENT_AMBULATORY_CARE_PROVIDER_SITE_OTHER): Payer: 59 | Admitting: Ophthalmology

## 2023-02-16 DIAGNOSIS — E113393 Type 2 diabetes mellitus with moderate nonproliferative diabetic retinopathy without macular edema, bilateral: Secondary | ICD-10-CM

## 2023-02-16 DIAGNOSIS — I10 Essential (primary) hypertension: Secondary | ICD-10-CM

## 2023-02-16 DIAGNOSIS — H3521 Other non-diabetic proliferative retinopathy, right eye: Secondary | ICD-10-CM

## 2023-02-16 DIAGNOSIS — H35371 Puckering of macula, right eye: Secondary | ICD-10-CM

## 2023-02-16 DIAGNOSIS — H34231 Retinal artery branch occlusion, right eye: Secondary | ICD-10-CM

## 2023-02-16 DIAGNOSIS — H35033 Hypertensive retinopathy, bilateral: Secondary | ICD-10-CM

## 2023-02-16 DIAGNOSIS — Z961 Presence of intraocular lens: Secondary | ICD-10-CM

## 2023-02-18 ENCOUNTER — Ambulatory Visit: Payer: 59

## 2023-02-20 ENCOUNTER — Ambulatory Visit: Payer: 59

## 2023-02-20 DIAGNOSIS — M25562 Pain in left knee: Secondary | ICD-10-CM | POA: Diagnosis not present

## 2023-02-20 DIAGNOSIS — R262 Difficulty in walking, not elsewhere classified: Secondary | ICD-10-CM

## 2023-02-20 DIAGNOSIS — M6281 Muscle weakness (generalized): Secondary | ICD-10-CM

## 2023-02-20 NOTE — Therapy (Signed)
OUTPATIENT PHYSICAL THERAPY LOWER EXTREMITY TREATMENT   Patient Name: [name] MRN: [id number] DOB:05/13/1950, 73 y.o., female Today's Date: 02/20/2023  END OF SESSION:  PT End of Session - 02/20/23 1210     Visit Number 7    Number of Visits 10    Date for PT Re-Evaluation 03/04/23    Authorization Type UHC Medicare/medicaid    PT Start Time 1150    PT Stop Time 1230    PT Time Calculation (min) 40 min    Equipment Utilized During Treatment Gait belt    Activity Tolerance Patient tolerated treatment well    Behavior During Therapy WFL for tasks assessed/performed               Past Medical History:  Diagnosis Date   Anemia    a. Noted on 07/2012 labs, instructed to f/u PCP.   Arthritis    "joints" (11/18/2012)   CAD (coronary artery disease), native coronary artery    a. Nonobstructive by cath 02/2012 (done because of low EF).   Chronic bronchitis (HCC)    "~ every other year" (11/18/2012)   Chronic combined systolic and diastolic CHF (congestive heart failure) (HCC)    a. 03/05/12 echo:  LVEF 20-25%, moderate LVH , inferior and basal to mid septal akinesis, anterior moderate to severe hypokinesis and grade 2 diastolic dysfunction. b. EF 07/2012: EF still 25% (unclear medication compliance).   Chronic lower back pain    Headache(784.0)    "often; maybe not daily" (11/18/2012)   High cholesterol    History of noncompliance with medical treatment    Hypertension    LBBB (left bundle branch block)    Orthopnea    Pneumonia    Tobacco abuse    Type II diabetes mellitus (HCC)    Past Surgical History:  Procedure Laterality Date   BI-VENTRICULAR IMPLANTABLE CARDIOVERTER DEFIBRILLATOR N/A 11/18/2012   Procedure: BI-VENTRICULAR IMPLANTABLE CARDIOVERTER DEFIBRILLATOR  (CRT-D);  Surgeon: Marinus Maw, MD;  Location: [facility] CATH LAB;  Service: Cardiovascular;  Laterality: N/A;   BI-VENTRICULAR IMPLANTABLE CARDIOVERTER DEFIBRILLATOR  (CRT-D)  11/18/2012   BIV ICD  GENERATOR CHANGEOUT N/A 11/22/2020   Procedure: BIV ICD GENERATOR CHANGEOUT;  Surgeon: Marinus Maw, MD;  Location: [facility] INVASIVE CV LAB;  Service: Cardiovascular;  Laterality: N/A;   CARDIAC CATHETERIZATION  03/04/12   nonobstructive CAD, elevated LVEDP and tortuous vessels suggestive of long-standing hypertension   COLONOSCOPY WITH PROPOFOL N/A 12/12/2016   Procedure: COLONOSCOPY WITH PROPOFOL;  Surgeon: Jeani Hawking, MD;  Location: [facility] ENDOSCOPY;  Service: Endoscopy;  Laterality: N/A;   JOINT REPLACEMENT     Bilateral hip and right knee   LEFT HEART CATH N/A 03/05/2012   Procedure: LEFT HEART CATH;  Surgeon: Laurey Morale, MD;  Location: [facility] CATH LAB;  Service: Cardiovascular;  Laterality: N/A;   TOTAL KNEE ARTHROPLASTY Left 12/22/2022   Procedure: LEFT TOTAL KNEE ARTHROPLASTY;  Surgeon: Gean Birchwood, MD;  Location: [facility] OR[facility];  Service: Orthopedics;  Laterality: Left;   Patient Active Problem List   Diagnosis Date Noted   Degenerative arthritis of left knee 12/17/2022   Right foot infection 10/22/2017   Cellulitis in diabetic foot (HCC) 10/22/2017   Hyperglycemia 10/22/2017   Lumbar radiculopathy 09/18/2017   Degenerative spondylolisthesis 09/18/2017   Atrial fibrillation (HCC) 02/11/2017   Paroxysmal A-fib (HCC)    Biventricular automatic implantable cardioverter defibrillator in situ    Sepsis (HCC) 04/20/2016   UTI (urinary tract infection) 04/20/2016   Dyspnea 07/19/2015  Interstitial lung disease (HCC) 11/20/2014   SIRS (systemic inflammatory response syndrome) (HCC) 02/03/2014   IDDM (insulin dependent diabetes mellitus) 02/03/2014   Tachycardia 06/14/2013   Chronic systolic CHF (congestive heart failure) (HCC) 09/29/2012   Hypoxia 03/06/2012   Hypertensive heart disease 03/06/2012   Nonischemic cardiomyopathy (HCC) 03/06/2012   Tobacco abuse 03/06/2012   Type 2 diabetes mellitus (HCC) 03/06/2012   Hypertension 03/06/2012   CAD (coronary artery disease), native coronary  artery 03/06/2012   Hypokalemia 03/06/2012   Acute bronchitis 02/01/2009   Sleep apnea 02/01/2009   Chest pain 02/01/2009    PCP: Dr. Corliss Blacker  REFERRING PROVIDER: Gean Birchwood, MD  REFERRING DIAG: [phone] (ICD-10-CM) - Unilateral primary osteoarthritis, left knee  THERAPY DIAG:  Muscle weakness (generalized)  Difficulty in walking, not elsewhere classified  Acute pain of left knee  Rationale for Evaluation and Treatment: Rehabilitation  ONSET DATE: 11/26/2022- date of referral; 12/22/22 L TKA  SUBJECTIVE:   SUBJECTIVE STATEMENT: Pt reports no pain in knee. She hasn't slepts well so she is tired.   PERTINENT HISTORY: Pt with hx including but not limited to arthritis, DM, hyperglycemia, lumbar radiculopathy, afib, CAD, sleep apnea, cardiomyopathy, bil THA, R TKA  PAIN:  PAIN:  Are you having pain? No   PRECAUTIONS: ICD/Pacemaker  RED FLAGS: None   WEIGHT BEARING RESTRICTIONS: No  FALLS:  Has patient fallen in last 6 months? No  LIVING ENVIRONMENT: Lives with: lives alone Lives in: House/apartment Stairs: No Has following equipment at home: Single point cane, Environmental consultant - 2 wheeled, and shower chair  OCCUPATION: Retired  PLOF: Independent  PATIENT GOALS: "improve pain, get back to walking without anything"  NEXT MD VISIT: 02/07/22  OBJECTIVE:  Note: Objective measures were completed at Evaluation unless otherwise noted.  PATIENT SURVEYS:  LEFS 42/80*100 = 52.5%  COGNITION: Overall cognitive status: Within functional limits for tasks assessed     S LOWER EXTREMITY ROM:  Active ROM Right eval Left eval Left 01/14/23 Left 01/23/23 Left 02/20/23  Hip flexion       Hip extension       Hip abduction       Hip adduction       Hip internal rotation       Hip external rotation       Knee flexion 123 100 108 119 122  Knee extension 0 Lacks 15 deg Lacks 10 deg Lacks 10 deg Lacks 10 deg  Ankle dorsiflexion       Ankle plantarflexion        Ankle inversion       Ankle eversion        (Blank rows = not tested)  LOWER EXTREMITY MMT:  MMT Right eval Left eval  Hip flexion    Hip extension    Hip abduction    Hip adduction    Hip internal rotation    Hip external rotation    Knee flexion    Knee extension    Ankle dorsiflexion    Ankle plantarflexion    Ankle inversion    Ankle eversion     (Blank rows = not tested)    FUNCTIONAL TESTS:  5 times sit to stand: 18 sec with use of bil UE, decrease WB tolerance noted on L LE 10 meter walk test: 0.67 m/s with walker    TODAY'S TREATMENT:  DATE: 02/20/2023 Sci Fit: level 5 for 10' Grade III knee extension mobilization Soft tissue mobilization to R quads Supine bridge: 20x Supine SAQ: 20x Supine SLR: 2 x 15 R and L Sit to stand: 5x no UE support Assessed LTG and reviewed progress and goals and POC with patient.      PATIENT EDUCATION:  Education details: see above Person educated: Patient Education method: Explanation Education comprehension: verbalized understanding  HOME EXERCISE PROGRAM: Access Code: [email or web address] URL: [email or web address] Date: 01/07/2023 Prepared by: Lavone Nian  Exercises - Supine Heel Slide  - 3 x daily - 7 x weekly - 10 reps - 5 sec hold - Long Sitting Quad Set with Towel Roll Under Heel  - 3 x daily - 7 x weekly - 2 sets - 10 reps - Sit to Stand with Armchair  - 1 x daily - 7 x weekly - 10 reps  ASSESSMENT:  CLINICAL IMPRESSION: Overall knee flexion is WFL now. Pt still lacks 10 deg to full extension actively and passively. Pt met LTG # 3 and 5 today and progressing towards her long term goals.  OBJECTIVE IMPAIRMENTS: Abnormal gait, decreased activity tolerance, decreased balance, decreased endurance, decreased mobility, difficulty walking, decreased ROM, decreased strength,  hypomobility, increased edema, increased muscle spasms, impaired flexibility, and pain.    GOALS: Goals reviewed with patient? Yes  SHORT TERM GOALS: Target date: 02/04/2023   Pt will demo -5 to 110 deg of L knee AROM to improve knee ROM with functional transfers, bed mobility and transfers Baseline: -15 to 100 deg; -10 to 119 (01/23/23); -7-118 deg (02/06/23) Goal status: Goal met  2.  PT will demo at least 50% compliance with HEP to self manage symptoms Baseline: issued on 01/07/23; pt reports 75% compliance (01/23/23) Goal status: goal met  3.   LONG TERM GOALS: Target date: 03/04/2023    Patient will demo 0-120 deg of L knee AROM to improve squatting, functional transfers and car transfers Baseline: -15 to 100 deg ; -10 to 122 deg (02/20/23) Goal status: Progressing continue  2.  Pt will demo gait speed of >0.80 m/s without AD to improve gait quality, improve community ambulation. Baseline: 0.67 m/s with RW (01/07/23); 0.75 m/s without AD (02/20/23) Goal status: Progressing continue 02/20/23  3.  Pt will report at least 9 points improvement on LEFS to improve overall function. Baseline: 42/80 (01/07/23);  55/80 (02/20/23) Goal status: Goal met  4.  Pt will be able to perform 5x sit to stand without use of bil UE to improve functional strength with transfers in under 18 seconds Baseline: 18 sec WITH use of bil UE (01/07/23); 20 sec without UE support (02/19/22) Goal status: Progressing continue  5.  Pt will report of pain of <3/10 at worst in L knee with daily functioning. Baseline: 7/10 (01/07/23); 0/10 (02/20/23) Goal status: Met     PLAN:  PT FREQUENCY: 2x/week  PT DURATION: 8 weeks  PLANNED INTERVENTIONS: 97164- PT Re-evaluation, 97110-Therapeutic exercises, 97530- Therapeutic activity, 97112- Neuromuscular re-education, 97535- Self Care, 54098- Manual therapy, [phone]- Gait training, Patient/Family education, Balance training, Stair training, Taping, Dry Needling, Joint  mobilization, Scar mobilization, Cryotherapy, Moist heat, and Manual therapy  PLAN FOR NEXT SESSION:Focus on improving R knee extension, improved cadence, improve sit to stand   Ileana Ladd, PT 02/20/2023, 12:11 PM

## 2023-02-23 ENCOUNTER — Ambulatory Visit: Payer: 59

## 2023-02-25 ENCOUNTER — Ambulatory Visit: Payer: 59

## 2023-02-25 ENCOUNTER — Other Ambulatory Visit: Payer: Self-pay

## 2023-02-25 NOTE — Progress Notes (Signed)
Specialty Pharmacy Refill Coordination Note  [name] is a 73 y.o. female contacted today regarding refills of specialty medication(s) Tafamidis [name])   Patient requested Delivery   Delivery date: 03/05/23   Verified address: [address]   [place] [address]   Medication will be filled on 03/04/23.

## 2023-02-26 ENCOUNTER — Ambulatory Visit: Payer: 59

## 2023-02-26 ENCOUNTER — Telehealth: Payer: Self-pay

## 2023-02-26 NOTE — Telephone Encounter (Signed)
Patient Name: MONDA CHASTAIN MRN: [id number] DOB:[birth year], 73 y.o., female Today's Date: 02/26/2023  Patient missed today's appt. Patient was called and spoke with patient. Patient mis read her phone message and she thought her next appt was at end of February. Patient was confirmed of her next 3 appt dates/times.   Ileana Ladd, PT 02/26/2023, 12:27 PM

## 2023-03-02 ENCOUNTER — Ambulatory Visit: Payer: 59 | Attending: Family Medicine

## 2023-03-02 DIAGNOSIS — M6281 Muscle weakness (generalized): Secondary | ICD-10-CM | POA: Diagnosis not present

## 2023-03-02 DIAGNOSIS — M25562 Pain in left knee: Secondary | ICD-10-CM | POA: Insufficient documentation

## 2023-03-02 DIAGNOSIS — R262 Difficulty in walking, not elsewhere classified: Secondary | ICD-10-CM | POA: Insufficient documentation

## 2023-03-02 NOTE — Therapy (Signed)
OUTPATIENT PHYSICAL THERAPY LOWER EXTREMITY TREATMENT   Patient Name: [name] MRN: [id number] DOB:[birth year], 73 y.o., female Today's Date: 03/02/2023  END OF SESSION:  PT End of Session - 03/02/23 1151     Visit Number 8    Number of Visits 10    Date for PT Re-Evaluation 03/04/23    Authorization Type UHC Medicare/medicaid    PT Start Time 1120    PT Stop Time 1200    PT Time Calculation (min) 40 min    Equipment Utilized During Treatment Gait belt    Activity Tolerance Patient tolerated treatment well    Behavior During Therapy WFL for tasks assessed/performed                Past Medical History:  Diagnosis Date   Anemia    a. Noted on 07/2012 labs, instructed to f/u PCP.   Arthritis    "joints" (11/18/2012)   CAD (coronary artery disease), native coronary artery    a. Nonobstructive by cath 02/2012 (done because of low EF).   Chronic bronchitis (HCC)    "~ every other year" (11/18/2012)   Chronic combined systolic and diastolic CHF (congestive heart failure) (HCC)    a. 03/05/12 echo:  LVEF 20-25%, moderate LVH , inferior and basal to mid septal akinesis, anterior moderate to severe hypokinesis and grade 2 diastolic dysfunction. b. EF 07/2012: EF still 25% (unclear medication compliance).   Chronic lower back pain    Headache(784.0)    "often; maybe not daily" (11/18/2012)   High cholesterol    History of noncompliance with medical treatment    Hypertension    LBBB (left bundle branch block)    Orthopnea    Pneumonia    Tobacco abuse    Type II diabetes mellitus (HCC)    Past Surgical History:  Procedure Laterality Date   BI-VENTRICULAR IMPLANTABLE CARDIOVERTER DEFIBRILLATOR N/A 11/18/2012   Procedure: BI-VENTRICULAR IMPLANTABLE CARDIOVERTER DEFIBRILLATOR  (CRT-D);  Surgeon: Marinus Maw, MD;  Location: [facility] CATH LAB;  Service: Cardiovascular;  Laterality: N/A;   BI-VENTRICULAR IMPLANTABLE CARDIOVERTER DEFIBRILLATOR  (CRT-D)  11/18/2012   BIV ICD  GENERATOR CHANGEOUT N/A 11/22/2020   Procedure: BIV ICD GENERATOR CHANGEOUT;  Surgeon: Marinus Maw, MD;  Location: [facility] INVASIVE CV LAB;  Service: Cardiovascular;  Laterality: N/A;   CARDIAC CATHETERIZATION  03/04/12   nonobstructive CAD, elevated LVEDP and tortuous vessels suggestive of long-standing hypertension   COLONOSCOPY WITH PROPOFOL N/A 12/12/2016   Procedure: COLONOSCOPY WITH PROPOFOL;  Surgeon: Jeani Hawking, MD;  Location: [facility] ENDOSCOPY;  Service: Endoscopy;  Laterality: N/A;   JOINT REPLACEMENT     Bilateral hip and right knee   LEFT HEART CATH N/A 03/05/2012   Procedure: LEFT HEART CATH;  Surgeon: Laurey Morale, MD;  Location: [facility] CATH LAB;  Service: Cardiovascular;  Laterality: N/A;   TOTAL KNEE ARTHROPLASTY Left 12/22/2022   Procedure: LEFT TOTAL KNEE ARTHROPLASTY;  Surgeon: Gean Birchwood, MD;  Location: [facility] ORS;  Service: Orthopedics;  Laterality: Left;   Patient Active Problem List   Diagnosis Date Noted   Degenerative arthritis of left knee 12/17/2022   Right foot infection 10/22/2017   Cellulitis in diabetic foot (HCC) 10/22/2017   Hyperglycemia 10/22/2017   Lumbar radiculopathy 09/18/2017   Degenerative spondylolisthesis 09/18/2017   Atrial fibrillation (HCC) 02/11/2017   Paroxysmal A-fib (HCC)    Biventricular automatic implantable cardioverter defibrillator in situ    Sepsis (HCC) 04/20/2016   UTI (urinary tract infection) 04/20/2016   Dyspnea 07/19/2015  Interstitial lung disease (HCC) 11/20/2014   SIRS (systemic inflammatory response syndrome) (HCC) 02/03/2014   IDDM (insulin dependent diabetes mellitus) 02/03/2014   Tachycardia 06/14/2013   Chronic systolic CHF (congestive heart failure) (HCC) 09/29/2012   Hypoxia 03/06/2012   Hypertensive heart disease 03/06/2012   Nonischemic cardiomyopathy (HCC) 03/06/2012   Tobacco abuse 03/06/2012   Type 2 diabetes mellitus (HCC) 03/06/2012   Hypertension 03/06/2012   CAD (coronary artery disease), native coronary  artery 03/06/2012   Hypokalemia 03/06/2012   Acute bronchitis 02/01/2009   Sleep apnea 02/01/2009   Chest pain 02/01/2009    PCP: Dr. Corliss Blacker  REFERRING PROVIDER: Gean Birchwood, MD  REFERRING DIAG: [phone] (ICD-10-CM) - Unilateral primary osteoarthritis, left knee  THERAPY DIAG:  Muscle weakness (generalized)  Acute pain of left knee  Difficulty in walking, not elsewhere classified  Rationale for Evaluation and Treatment: Rehabilitation  ONSET DATE: 11/26/2022- date of referral; 12/22/22 L TKA  SUBJECTIVE:   SUBJECTIVE STATEMENT: Pt reports no pain in knee. She hasn't slepts well so she is tired.   PERTINENT HISTORY: Pt with hx including but not limited to arthritis, DM, hyperglycemia, lumbar radiculopathy, afib, CAD, sleep apnea, cardiomyopathy, bil THA, R TKA  PAIN:  PAIN:  Are you having pain? No   PRECAUTIONS: ICD/Pacemaker  RED FLAGS: None   WEIGHT BEARING RESTRICTIONS: No  FALLS:  Has patient fallen in last 6 months? No  LIVING ENVIRONMENT: Lives with: lives alone Lives in: House/apartment Stairs: No Has following equipment at home: Single point cane, Environmental consultant - 2 wheeled, and shower chair  OCCUPATION: Retired  PLOF: Independent  PATIENT GOALS: "improve pain, get back to walking without anything"  NEXT MD VISIT: 02/07/22  OBJECTIVE:  Note: Objective measures were completed at Evaluation unless otherwise noted.  PATIENT SURVEYS:  LEFS 42/80*100 = 52.5%  COGNITION: Overall cognitive status: Within functional limits for tasks assessed     S LOWER EXTREMITY ROM:  Active ROM Right eval Left eval Left 01/14/23 Left 01/23/23 Left 02/20/23  Hip flexion       Hip extension       Hip abduction       Hip adduction       Hip internal rotation       Hip external rotation       Knee flexion 123 100 108 119 122  Knee extension 0 Lacks 15 deg Lacks 10 deg Lacks 10 deg Lacks 10 deg  Ankle dorsiflexion       Ankle plantarflexion        Ankle inversion       Ankle eversion        (Blank rows = not tested)  LOWER EXTREMITY MMT:  MMT Right eval Left eval  Hip flexion    Hip extension    Hip abduction    Hip adduction    Hip internal rotation    Hip external rotation    Knee flexion    Knee extension    Ankle dorsiflexion    Ankle plantarflexion    Ankle inversion    Ankle eversion     (Blank rows = not tested)    FUNCTIONAL TESTS:  5 times sit to stand: 18 sec with use of bil UE, decrease WB tolerance noted on L LE; 20 sec without UE support (02/19/22) 10 meter walk test: 0.67 m/s with walker0.75 m/s without AD (02/20/23)    TODAY'S TREATMENT:  DATE: 03/02/2023 Supine passive knee extension stretching Grade IV knee extension mobilization in L LE SAQ: manually resisted: 2 x 10 SL hip abduction: 2 x 10 L only Prone hamstring curls: 2 x 10 L only, no resistance Prone quad sets: 2 x 10 L only Prone hip extensions: 2 x 10 L only Sci Fit: manual level 5 for 10' post session Bil leg press: 80 lbs 2 x 10  Uni leg press: 40lbs 15x L only    PATIENT EDUCATION:  Education details: see above Person educated: Patient Education method: Explanation Education comprehension: verbalized understanding  HOME EXERCISE PROGRAM: Access Code: [email or web address] URL: [email or web address] Date: 01/07/2023 Prepared by: Lavone Nian  Exercises - Supine Heel Slide  - 3 x daily - 7 x weekly - 10 reps - 5 sec hold - Long Sitting Quad Set with Towel Roll Under Heel  - 3 x daily - 7 x weekly - 2 sets - 10 reps - Sit to Stand with Armchair  - 1 x daily - 7 x weekly - 10 reps  ASSESSMENT:  CLINICAL IMPRESSION: Pt demo minimal edema in L knee. Knee extension is still limited to -10 with AROM. Patient tolerated exercises well without any pain or soreness. Pain is well controlled where patient is  not reporting any pain in the knee.   OBJECTIVE IMPAIRMENTS: Abnormal gait, decreased activity tolerance, decreased balance, decreased endurance, decreased mobility, difficulty walking, decreased ROM, decreased strength, hypomobility, increased edema, increased muscle spasms, impaired flexibility, and pain.    GOALS: Goals reviewed with patient? Yes  SHORT TERM GOALS: Target date: 02/04/2023   Pt will demo -5 to 110 deg of L knee AROM to improve knee ROM with functional transfers, bed mobility and transfers Baseline: -15 to 100 deg; -10 to 119 (01/23/23); -7-118 deg (02/06/23) Goal status: Goal met  2.  PT will demo at least 50% compliance with HEP to self manage symptoms Baseline: issued on 01/07/23; pt reports 75% compliance (01/23/23) Goal status: goal met  3.   LONG TERM GOALS: Target date: 03/04/2023    Patient will demo 0-120 deg of L knee AROM to improve squatting, functional transfers and car transfers Baseline: -15 to 100 deg ; -10 to 122 deg (02/20/23) Goal status: Progressing continue  2.  Pt will demo gait speed of >0.80 m/s without AD to improve gait quality, improve community ambulation. Baseline: 0.67 m/s with RW (01/07/23); 0.75 m/s without AD (02/20/23) Goal status: Progressing continue 02/20/23  3.  Pt will report at least 9 points improvement on LEFS to improve overall function. Baseline: 42/80 (01/07/23);  55/80 (02/20/23) Goal status: Goal met  4.  Pt will be able to perform 5x sit to stand without use of bil UE to improve functional strength with transfers in under 18 seconds Baseline: 18 sec WITH use of bil UE (01/07/23); 20 sec without UE support (02/19/22) Goal status: Progressing continue  5.  Pt will report of pain of <3/10 at worst in L knee with daily functioning. Baseline: 7/10 (01/07/23); 0/10 (02/20/23) Goal status: Met     PLAN:  PT FREQUENCY: 2x/week  PT DURATION: 8 weeks  PLANNED INTERVENTIONS: 97164- PT Re-evaluation, 97110-Therapeutic  exercises, 97530- Therapeutic activity, 97112- Neuromuscular re-education, 97535- Self Care, 47829- Manual therapy, [phone]- Gait training, Patient/Family education, Balance training, Stair training, Taping, Dry Needling, Joint mobilization, Scar mobilization, Cryotherapy, Moist heat, and Manual therapy  PLAN FOR NEXT SESSION:Focus on improving R knee extension, improved cadence, improve sit to stand   Ileana Ladd,  PT 03/02/2023, 11:53 AM

## 2023-03-04 ENCOUNTER — Ambulatory Visit: Payer: 59

## 2023-03-04 DIAGNOSIS — R262 Difficulty in walking, not elsewhere classified: Secondary | ICD-10-CM | POA: Diagnosis not present

## 2023-03-04 DIAGNOSIS — M25562 Pain in left knee: Secondary | ICD-10-CM

## 2023-03-04 DIAGNOSIS — M6281 Muscle weakness (generalized): Secondary | ICD-10-CM

## 2023-03-04 NOTE — Therapy (Signed)
 OUTPATIENT PHYSICAL THERAPY LOWER EXTREMITY TREATMENT   Patient Name: [name] MRN: [id number] DOB:February 16, 1950, 73 y.o., female Today's Date: 03/04/2023  END OF SESSION:  PT End of Session - 03/04/23 1113     Visit Number 9    Number of Visits 10    Date for PT Re-Evaluation 03/04/23    Authorization Type UHC Medicare/medicaid    PT Start Time 1100    PT Stop Time 1145    PT Time Calculation (min) 45 min    Equipment Utilized During Treatment Gait belt    Activity Tolerance Patient tolerated treatment well    Behavior During Therapy WFL for tasks assessed/performed                Past Medical History:  Diagnosis Date   Anemia    a. Noted on 07/2012 labs, instructed to f/u PCP.   Arthritis    joints (11/18/2012)   CAD (coronary artery disease), native coronary artery    a. Nonobstructive by cath 02/2012 (done because of low EF).   Chronic bronchitis (HCC)    ~ every other year (11/18/2012)   Chronic combined systolic and diastolic CHF (congestive heart failure) (HCC)    a. 03/05/12 echo:  LVEF 20-25%, moderate LVH , inferior and basal to mid septal akinesis, anterior moderate to severe hypokinesis and grade 2 diastolic dysfunction. b. EF 07/2012: EF still 25% (unclear medication compliance).   Chronic lower back pain    Headache(784.0)    often; maybe not daily (11/18/2012)   High cholesterol    History of noncompliance with medical treatment    Hypertension    LBBB (left bundle branch block)    Orthopnea    Pneumonia    Tobacco abuse    Type II diabetes mellitus (HCC)    Past Surgical History:  Procedure Laterality Date   BI-VENTRICULAR IMPLANTABLE CARDIOVERTER DEFIBRILLATOR N/A 11/18/2012   Procedure: BI-VENTRICULAR IMPLANTABLE CARDIOVERTER DEFIBRILLATOR  (CRT-D);  Surgeon: Danelle LELON Birmingham, MD;  Location: [facility] CATH LAB;  Service: Cardiovascular;  Laterality: N/A;   BI-VENTRICULAR IMPLANTABLE CARDIOVERTER DEFIBRILLATOR  (CRT-D)  11/18/2012   BIV ICD  GENERATOR CHANGEOUT N/A 11/22/2020   Procedure: BIV ICD GENERATOR CHANGEOUT;  Surgeon: Birmingham Danelle LELON, MD;  Location[facility] INVASIVE CV LAB;  Service: Cardiovascular;  Laterality: N/A;   CARDIAC CATHETERIZATION  03/04/12   nonobstructive CAD, elevated LVEDP and tortuous vessels suggestive of long-standing hypertension   COLONOSCOPY WITH PROPOFOL  N/A 12/12/2016   Procedure: COLONOSCOPY WITH PROPOFOL ;  Surgeon: Rollin Dover, MD;  Location: [facility] ENDOSCOPY;  Service: Endoscopy;  Laterality: N/A;   JOINT REPLACEMENT     Bilateral hip and right knee   LEFT HEART CATH N/A 03/05/2012   Procedure: LEFT HEART CATH;  Surgeon: Ezra GORMAN Shuck, MD;  Location: [facility] CATH LAB;  Service: Cardiovascular;  Laterality: N/A;   TOTAL KNEE ARTHROPLASTY Left 12/22/2022   Procedure: LEFT TOTAL KNEE ARTHROPLASTY;  Surgeon: Liam Lerner, MD;  Location: [facility] ORS;  Service: Orthopedics;  Laterality: Left;   Patient Active Problem List   Diagnosis Date Noted   Degenerative arthritis of left knee 12/17/2022   Right foot infection 10/22/2017   Cellulitis in diabetic foot (HCC) 10/22/2017   Hyperglycemia 10/22/2017   Lumbar radiculopathy 09/18/2017   Degenerative spondylolisthesis 09/18/2017   Atrial fibrillation (HCC) 02/11/2017   Paroxysmal A-fib (HCC)    Biventricular automatic implantable cardioverter defibrillator in situ    Sepsis (HCC) 04/20/2016   UTI (urinary tract infection) 04/20/2016   Dyspnea 07/19/2015  Interstitial lung disease (HCC) 11/20/2014   SIRS (systemic inflammatory response syndrome) (HCC) 02/03/2014   IDDM (insulin  dependent diabetes mellitus) 02/03/2014   Tachycardia 06/14/2013   Chronic systolic CHF (congestive heart failure) (HCC) 09/29/2012   Hypoxia 03/06/2012   Hypertensive heart disease 03/06/2012   Nonischemic cardiomyopathy (HCC) 03/06/2012   Tobacco abuse 03/06/2012   Type 2 diabetes mellitus (HCC) 03/06/2012   Hypertension 03/06/2012   CAD (coronary artery disease), native coronary  artery 03/06/2012   Hypokalemia 03/06/2012   Acute bronchitis 02/01/2009   Sleep apnea 02/01/2009   Chest pain 02/01/2009    PCP: Dr. Sula Sigrid Kays  REFERRING PROVIDER: Liam Lerner, MD  REFERRING DIAG: [phone] (ICD-10-CM) - Unilateral primary osteoarthritis, left knee  THERAPY DIAG:  Muscle weakness (generalized)  Difficulty in walking, not elsewhere classified  Acute pain of left knee  Rationale for Evaluation and Treatment: Rehabilitation  ONSET DATE: 11/26/2022- date of referral; 12/22/22 L TKA  SUBJECTIVE:   SUBJECTIVE STATEMENT: Pt reports no new complaints in the knee.    PERTINENT HISTORY: Pt with hx including but not limited to arthritis, DM, hyperglycemia, lumbar radiculopathy, afib, CAD, sleep apnea, cardiomyopathy, bil THA, R TKA  PAIN:  PAIN:  Are you having pain? No   PRECAUTIONS: ICD/Pacemaker  RED FLAGS: None   WEIGHT BEARING RESTRICTIONS: No  FALLS:  Has patient fallen in last 6 months? No  LIVING ENVIRONMENT: Lives with: lives alone Lives in: House/apartment Stairs: No Has following equipment at home: Single point cane, Environmental Consultant - 2 wheeled, and shower chair  OCCUPATION: Retired  PLOF: Independent  PATIENT GOALS: improve pain, get back to walking without anything  NEXT MD VISIT: 02/07/22  OBJECTIVE:  Note: Objective measures were completed at Evaluation unless otherwise noted.  PATIENT SURVEYS:  LEFS 42/80*100 = 52.5%  COGNITION: Overall cognitive status: Within functional limits for tasks assessed     S LOWER EXTREMITY ROM:  Active ROM Right eval Left eval Left 01/14/23 Left 01/23/23 Left 02/20/23  Hip flexion       Hip extension       Hip abduction       Hip adduction       Hip internal rotation       Hip external rotation       Knee flexion 123 100 108 119 122  Knee extension 0 Lacks 15 deg Lacks 10 deg Lacks 10 deg Lacks 10 deg  Ankle dorsiflexion       Ankle plantarflexion       Ankle inversion        Ankle eversion        (Blank rows = not tested)  LOWER EXTREMITY MMT:  MMT Right eval Left eval  Hip flexion    Hip extension    Hip abduction    Hip adduction    Hip internal rotation    Hip external rotation    Knee flexion    Knee extension    Ankle dorsiflexion    Ankle plantarflexion    Ankle inversion    Ankle eversion     (Blank rows = not tested)    FUNCTIONAL TESTS:  5 times sit to stand: 18 sec with use of bil UE, decrease WB tolerance noted on L LE; 20 sec without UE support (02/19/22) 10 meter walk test: 0.67 m/s with walker0.75 m/s without AD (02/20/23)    TODAY'S TREATMENT:  DATE: 03/04/2023 Sci Fit: manual level 5 for 10'  Supine passive knee extension stretching Grade IV knee extension mobilization in L LE SAQ: manually resisted: 2 x 10, 2lbs L only SLR: 2lbs 2 x 10 L only SL hip abduction: 2 x 10, 2lbs L only Prone hamstring curls: 2 x 10, 2lbs L only SL clamshells: manual resistance: 2 x 10 L only Prone hip extensions: 2 x 10, 2lbs L only Bil leg press: 80 lbs 2 x 10  Uni leg press: 40lbs 15x L only    PATIENT EDUCATION:  Education details: see above Person educated: Patient Education method: Explanation Education comprehension: verbalized understanding  HOME EXERCISE PROGRAM: Access Code: [email or web address] URL: [email or web address] Date: 01/07/2023 Prepared by: Raj Blanch  Exercises - Supine Heel Slide  - 3 x daily - 7 x weekly - 10 reps - 5 sec hold - Long Sitting Quad Set with Towel Roll Under Heel  - 3 x daily - 7 x weekly - 2 sets - 10 reps - Sit to Stand with Armchair  - 1 x daily - 7 x weekly - 10 reps  ASSESSMENT:  CLINICAL IMPRESSION: Pt tolerated session. She met gait speed goal and 5x sit to stand goal today.  OBJECTIVE IMPAIRMENTS: Abnormal gait, decreased activity tolerance, decreased  balance, decreased endurance, decreased mobility, difficulty walking, decreased ROM, decreased strength, hypomobility, increased edema, increased muscle spasms, impaired flexibility, and pain.    GOALS: Goals reviewed with patient? Yes  SHORT TERM GOALS: Target date: 02/04/2023   Pt will demo -5 to 110 deg of L knee AROM to improve knee ROM with functional transfers, bed mobility and transfers Baseline: -15 to 100 deg; -10 to 119 (01/23/23); -7-118 deg (02/06/23) Goal status: Goal met  2.  PT will demo at least 50% compliance with HEP to self manage symptoms Baseline: issued on 01/07/23; pt reports 75% compliance (01/23/23) Goal status: goal met  3.   LONG TERM GOALS: Target date: 03/04/2023    Patient will demo 0-120 deg of L knee AROM to improve squatting, functional transfers and car transfers Baseline: -15 to 100 deg ; -10 to 122 deg (02/20/23) Goal status: Progressing continue  2.  Pt will demo gait speed of >0.80 m/s without AD to improve gait quality, improve community ambulation. Baseline: 0.67 m/s with RW (01/07/23); 0.75 m/s without AD (02/20/23); 0.90 m/s without AD (03/04/23) Goal status: Met  3.  Pt will report at least 9 points improvement on LEFS to improve overall function. Baseline: 42/80 (01/07/23);  55/80 (02/20/23) Goal status: Goal met  4.  Pt will be able to perform 5x sit to stand without use of bil UE to improve functional strength with transfers in under 18 seconds Baseline: 18 sec WITH use of bil UE (01/07/23); 20 sec without UE support (02/19/22); 17 sec without use of UE support (03/04/23) Goal status: MET  5.  Pt will report of pain of <3/10 at worst in L knee with daily functioning. Baseline: 7/10 (01/07/23); 0/10 (02/20/23) Goal status: Met     PLAN:  PT FREQUENCY: 2x/week  PT DURATION: 8 weeks  PLANNED INTERVENTIONS: 97164- PT Re-evaluation, 97110-Therapeutic exercises, 97530- Therapeutic activity, 97112- Neuromuscular re-education, 97535- Self  Care, [id number]- Manual therapy, [phone]- Gait training, Patient/Family education, Balance training, Stair training, Taping, Dry Needling, Joint mobilization, Scar mobilization, Cryotherapy, Moist heat, and Manual therapy  PLAN FOR NEXT SESSION:Focus on improving R knee extension, improved cadence, improve sit to stand   Raj LOISE Blanch, PT 03/04/2023, 11:45 AM

## 2023-03-09 ENCOUNTER — Ambulatory Visit: Payer: 59

## 2023-03-09 DIAGNOSIS — M6281 Muscle weakness (generalized): Secondary | ICD-10-CM | POA: Diagnosis not present

## 2023-03-09 DIAGNOSIS — M25562 Pain in left knee: Secondary | ICD-10-CM

## 2023-03-09 DIAGNOSIS — R262 Difficulty in walking, not elsewhere classified: Secondary | ICD-10-CM | POA: Diagnosis not present

## 2023-03-09 NOTE — Therapy (Signed)
 OUTPATIENT PHYSICAL THERAPY LOWER EXTREMITY DISCHARGE SUMMARY   Patient Name: [name] MRN: [id number] DOB:[birth year], 73 y.o., female Today's Date: 03/09/2023  PHYSICAL THERAPY DISCHARGE SUMMARY  Visits from Start of Care: 10  Current functional level related to goals / functional outcomes: Independent with gait, transfers, ADLs   Remaining deficits: -10 deg of knee extension in L LE (has hit plateau)   Education / Equipment: Patient education.   Patient agrees to discharge. Patient goals were partially met. Patient is being discharged due to maximized rehab potential.   END OF SESSION:  PT End of Session - 03/09/23 1113     Visit Number 10    Number of Visits 10    Date for PT Re-Evaluation 03/04/23    Authorization Type [employer] Medicare/medicaid    PT Start Time 1100    PT Stop Time 1130    PT Time Calculation (min) 30 min    Equipment Utilized During Treatment Gait belt    Activity Tolerance Patient tolerated treatment well    Behavior During Therapy WFL for tasks assessed/performed                Past Medical History:  Diagnosis Date   Anemia    a. Noted on 07/2012 labs, instructed to f/u PCP.   Arthritis    "joints" (11/18/2012)   CAD (coronary artery disease), native coronary artery    a. Nonobstructive by cath 02/2012 (done because of low EF).   Chronic bronchitis (HCC)    "~ every other year" (11/18/2012)   Chronic combined systolic and diastolic CHF (congestive heart failure) (HCC)    a. 03/05/12 echo:  LVEF 20-25%, moderate LVH , inferior and basal to mid septal akinesis, anterior moderate to severe hypokinesis and grade 2 diastolic dysfunction. b. EF 07/2012: EF still 25% (unclear medication compliance).   Chronic lower back pain    Headache(784.0)    "often; maybe not daily" (11/18/2012)   High cholesterol    History of noncompliance with medical treatment    Hypertension    LBBB (left bundle branch block)    Orthopnea    Pneumonia     Tobacco abuse    Type II diabetes mellitus (HCC)    Past Surgical History:  Procedure Laterality Date   BI-VENTRICULAR IMPLANTABLE CARDIOVERTER DEFIBRILLATOR N/A 11/18/2012   Procedure: BI-VENTRICULAR IMPLANTABLE CARDIOVERTER DEFIBRILLATOR  (CRT-D);  Surgeon: Tammie Fall, MD;  Location: [facility] CATH LAB;  Service: Cardiovascular;  Laterality: N/A;   BI-VENTRICULAR IMPLANTABLE CARDIOVERTER DEFIBRILLATOR  (CRT-D)  11/18/2012   BIV ICD GENERATOR CHANGEOUT N/A 11/22/2020   Procedure: BIV ICD GENERATOR CHANGEOUT;  Surgeon: Tammie Fall, MD;  Location: [facility] INVASIVE CV LAB;  Service: Cardiovascular;  Laterality: N/A;   CARDIAC CATHETERIZATION  03/04/12   nonobstructive CAD, elevated LVEDP and tortuous vessels suggestive of long-standing hypertension   COLONOSCOPY WITH PROPOFOL  N/A 12/12/2016   Procedure: COLONOSCOPY WITH PROPOFOL ;  Surgeon: Alvis Jourdain, MD;  Location: [facility] ENDOSCOPY;  Service: Endoscopy;  Laterality: N/A;   JOINT REPLACEMENT     Bilateral hip and right knee   LEFT HEART CATH N/A 03/05/2012   Procedure: LEFT HEART CATH;  Surgeon: Darlis Eisenmenger, MD;  Location: [facility] CATH LAB;  Service: Cardiovascular;  Laterality: N/A;   TOTAL KNEE ARTHROPLASTY Left 12/22/2022   Procedure: LEFT TOTAL KNEE ARTHROPLASTY;  Surgeon: Wendolyn Hamburger, MD;  Location: [facility] ORS;  Service: Orthopedics;  Laterality: Left;   Patient Active Problem List   Diagnosis Date Noted   Degenerative arthritis  of left knee 12/17/2022   Right foot infection 10/22/2017   Cellulitis in diabetic foot (HCC) 10/22/2017   Hyperglycemia 10/22/2017   Lumbar radiculopathy 09/18/2017   Degenerative spondylolisthesis 09/18/2017   Atrial fibrillation (HCC) 02/11/2017   Paroxysmal A-fib (HCC)    Biventricular automatic implantable cardioverter defibrillator in situ    Sepsis (HCC) 04/20/2016   UTI (urinary tract infection) 04/20/2016   Dyspnea 07/19/2015   Interstitial lung disease (HCC) 11/20/2014   SIRS (systemic inflammatory  response syndrome) (HCC) 02/03/2014   IDDM (insulin  dependent diabetes mellitus) 02/03/2014   Tachycardia 06/14/2013   Chronic systolic CHF (congestive heart failure) (HCC) 09/29/2012   Hypoxia 03/06/2012   Hypertensive heart disease 03/06/2012   Nonischemic cardiomyopathy (HCC) 03/06/2012   Tobacco abuse 03/06/2012   Type 2 diabetes mellitus (HCC) 03/06/2012   Hypertension 03/06/2012   CAD (coronary artery disease), native coronary artery 03/06/2012   Hypokalemia 03/06/2012   Acute bronchitis 02/01/2009   Sleep apnea 02/01/2009   Chest pain 02/01/2009    PCP: Dr. Olene Berne  REFERRING PROVIDER: Wendolyn Hamburger, MD  REFERRING DIAG: [phone] (ICD-10-CM) - Unilateral primary osteoarthritis, left knee  THERAPY DIAG:  Muscle weakness (generalized)  Acute pain of left knee  Difficulty in walking, not elsewhere classified  Rationale for Evaluation and Treatment: Rehabilitation  ONSET DATE: 11/26/2022- date of referral; 12/22/22 L TKA  SUBJECTIVE:   SUBJECTIVE STATEMENT: Pt reports no new complaints in the knee.    PERTINENT HISTORY: Pt with hx including but not limited to arthritis, DM, hyperglycemia, lumbar radiculopathy, afib, CAD, sleep apnea, cardiomyopathy, bil THA, R TKA  PAIN:  PAIN:  Are you having pain? No   PRECAUTIONS: ICD/Pacemaker  RED FLAGS: None   WEIGHT BEARING RESTRICTIONS: No  FALLS:  Has patient fallen in last 6 months? No  LIVING ENVIRONMENT: Lives with: lives alone Lives in: House/apartment Stairs: No Has following equipment at home: Single point cane, Environmental consultant - 2 wheeled, and shower chair  OCCUPATION: Retired  PLOF: Independent  PATIENT GOALS: "improve pain, get back to walking without anything"  NEXT MD VISIT: 02/07/22  OBJECTIVE:  Note: Objective measures were completed at Evaluation unless otherwise noted.  PATIENT SURVEYS:  LEFS 42/80*100 = 52.5%  COGNITION: Overall cognitive status: Within functional limits for  tasks assessed     S LOWER EXTREMITY ROM:  Active ROM Right eval Left eval Left 01/14/23 Left 01/23/23 Left 02/20/23  Hip flexion       Hip extension       Hip abduction       Hip adduction       Hip internal rotation       Hip external rotation       Knee flexion 123 100 108 119 122  Knee extension 0 Lacks 15 deg Lacks 10 deg Lacks 10 deg Lacks 10 deg  Ankle dorsiflexion       Ankle plantarflexion       Ankle inversion       Ankle eversion        (Blank rows = not tested)  LOWER EXTREMITY MMT:  MMT Right eval Left eval  Hip flexion    Hip extension    Hip abduction    Hip adduction    Hip internal rotation    Hip external rotation    Knee flexion    Knee extension    Ankle dorsiflexion    Ankle plantarflexion    Ankle inversion    Ankle eversion     (Blank  rows = not tested)    FUNCTIONAL TESTS:  5 times sit to stand: 18 sec with use of bil UE, decrease WB tolerance noted on L LE; 20 sec without UE support (02/19/22) 10 meter walk test: 0.67 m/s with walker0.75 m/s without AD (02/20/23)    TODAY'S TREATMENT:                                                                                                                              DATE: 03/09/2023 Sci Fit: manual level 5 for 10'  Reviewed following HEP: with patient Quad sets with foam roll under heel: 10 x 5" holds SAQ: manually resisted: 2 x 10, 2lbs L only SLR: 2lbs 2 x 10 L only SL hip abduction: 2 x 10, 2lbs L only Prone hamstring curls: 2 x 10, 2lbs L only SL clamshells: manual resistance: 2 x 10 L only Prone hip extensions: 2 x 10, 2lbs L only Bil leg press: 80 lbs 2 x 10  Uni leg press: 40lbs 15x L only    PATIENT EDUCATION:  Education details: see above Person educated: Patient Education method: Explanation Education comprehension: verbalized understanding  HOME EXERCISE PROGRAM: Access Code: [email or web address] URL: [email or web address] Date: 03/09/2023 Prepared by: Susanna Epley  Exercises - Supine Heel Slide  - 3 x daily - 7 x weekly - 10 reps - 5 sec hold - Long Sitting Quad Set with Towel Roll Under Heel  - 3 x daily - 7 x weekly - 2 sets - 10 reps - Supine Active Straight Leg Raise  - 1 x daily - 7 x weekly - 2 sets - 10 reps - Prone Hip Extension  - 1 x daily - 7 x weekly - 2 sets - 10 reps - Sidelying Hip Abduction  - 1 x daily - 7 x weekly - 2 sets - 10 reps - Sit to Stand with Armchair  - 1 x daily - 7 x weekly - 10 reps  ASSESSMENT:  CLINICAL IMPRESSION: Patient has been seen for total of 10 session since her evaluation. Patient has progressed well. Patient currently demonstrates -10 to 122 deg of knee AROM. Pt has some limitations with achieving full knee extension with hard end feel. Patient has met all of her long term goals and will be discharged from skilled PT with independent home exercise program.  OBJECTIVE IMPAIRMENTS: Abnormal gait, decreased activity tolerance, decreased balance, decreased endurance, decreased mobility, difficulty walking, decreased ROM, decreased strength, hypomobility, increased edema, increased muscle spasms, impaired flexibility, and pain.    GOALS: Goals reviewed with patient? Yes  SHORT TERM GOALS: Target date: 02/04/2023   Pt will demo -5 to 110 deg of L knee AROM to improve knee ROM with functional transfers, bed mobility and transfers Baseline: -15 to 100 deg; -10 to 119 (01/23/23); -7-118 deg (02/06/23) Goal status: Goal met  2.  PT will demo at least 50% compliance with HEP to self manage symptoms Baseline: issued  on 01/07/23; pt reports 75% compliance (01/23/23) Goal status: goal met  3.   LONG TERM GOALS: Target date: 03/04/2023    Patient will demo 0-120 deg of L knee AROM to improve squatting, functional transfers and car transfers Baseline: -15 to 100 deg ; -10 to 122 deg (02/20/23) Goal status: not met  2.  Pt will demo gait speed of >0.80 m/s without AD to improve gait quality, improve community  ambulation. Baseline: 0.67 m/s with RW (01/07/23); 0.75 m/s without AD (02/20/23); 0.90 m/s without AD (03/04/23) Goal status: Met  3.  Pt will report at least 9 points improvement on LEFS to improve overall function. Baseline: 42/80 (01/07/23);  55/80 (02/20/23); 55/80 (03/09/23) Goal status: Goal met  4.  Pt will be able to perform 5x sit to stand without use of bil UE to improve functional strength with transfers in under 18 seconds Baseline: 18 sec WITH use of bil UE (01/07/23); 20 sec without UE support (02/19/22); 17 sec without use of UE support (03/04/23) Goal status: MET  5.  Pt will report of pain of <3/10 at worst in L knee with daily functioning. Baseline: 7/10 (01/07/23); 0/10 (02/20/23) Goal status: Met     PLAN:  Discharge from skilled PT  Kristine Phalen, PT 03/09/2023, 11:36 AM

## 2023-03-10 DIAGNOSIS — M25562 Pain in left knee: Secondary | ICD-10-CM | POA: Diagnosis not present

## 2023-03-10 DIAGNOSIS — G894 Chronic pain syndrome: Secondary | ICD-10-CM | POA: Diagnosis not present

## 2023-03-10 DIAGNOSIS — Z5181 Encounter for therapeutic drug level monitoring: Secondary | ICD-10-CM | POA: Diagnosis not present

## 2023-03-10 DIAGNOSIS — Z79891 Long term (current) use of opiate analgesic: Secondary | ICD-10-CM | POA: Diagnosis not present

## 2023-03-10 DIAGNOSIS — M179 Osteoarthritis of knee, unspecified: Secondary | ICD-10-CM | POA: Diagnosis not present

## 2023-03-12 ENCOUNTER — Telehealth (HOSPITAL_COMMUNITY): Payer: Self-pay | Admitting: Cardiology

## 2023-03-12 NOTE — Telephone Encounter (Signed)
Will forward to schedulers to assist with appts

## 2023-03-12 NOTE — Telephone Encounter (Signed)
-----   Message from Nurse Richarda Osmond sent at 03/12/2023  8:46 AM EST ----- Regarding: Overdue for follow up with HF clinic Hey all,  Wasn't sure what pool to route this to.  Pt is overdue for follow up with HF.  She may have no showed a follow up appointment.  Thank you Boneta Lucks RN

## 2023-03-19 NOTE — Telephone Encounter (Signed)
Appt scheduled 3/7

## 2023-03-20 DIAGNOSIS — H35371 Puckering of macula, right eye: Secondary | ICD-10-CM | POA: Diagnosis not present

## 2023-03-20 DIAGNOSIS — H524 Presbyopia: Secondary | ICD-10-CM | POA: Diagnosis not present

## 2023-03-20 DIAGNOSIS — E113393 Type 2 diabetes mellitus with moderate nonproliferative diabetic retinopathy without macular edema, bilateral: Secondary | ICD-10-CM | POA: Diagnosis not present

## 2023-03-20 DIAGNOSIS — H34231 Retinal artery branch occlusion, right eye: Secondary | ICD-10-CM | POA: Diagnosis not present

## 2023-03-20 DIAGNOSIS — H40023 Open angle with borderline findings, high risk, bilateral: Secondary | ICD-10-CM | POA: Diagnosis not present

## 2023-03-20 DIAGNOSIS — H35033 Hypertensive retinopathy, bilateral: Secondary | ICD-10-CM | POA: Diagnosis not present

## 2023-03-22 ENCOUNTER — Encounter (HOSPITAL_COMMUNITY): Payer: Self-pay | Admitting: Emergency Medicine

## 2023-03-22 ENCOUNTER — Encounter (HOSPITAL_COMMUNITY): Payer: Self-pay | Admitting: Pharmacy Technician

## 2023-03-22 ENCOUNTER — Emergency Department (HOSPITAL_COMMUNITY): Payer: 59

## 2023-03-22 ENCOUNTER — Other Ambulatory Visit: Payer: Self-pay

## 2023-03-22 ENCOUNTER — Ambulatory Visit (HOSPITAL_COMMUNITY)
Admission: EM | Admit: 2023-03-22 | Discharge: 2023-03-22 | Disposition: A | Discharge status: Home / Self Care | Payer: 59

## 2023-03-22 ENCOUNTER — Emergency Department (HOSPITAL_COMMUNITY)
Admission: EM | Admit: 2023-03-22 | Discharge: 2023-03-22 | Disposition: A | Discharge status: Home / Self Care | Payer: 59 | Attending: Emergency Medicine | Admitting: Emergency Medicine

## 2023-03-22 DIAGNOSIS — M19011 Primary osteoarthritis, right shoulder: Secondary | ICD-10-CM | POA: Diagnosis not present

## 2023-03-22 DIAGNOSIS — N189 Chronic kidney disease, unspecified: Secondary | ICD-10-CM | POA: Insufficient documentation

## 2023-03-22 DIAGNOSIS — M25511 Pain in right shoulder: Secondary | ICD-10-CM

## 2023-03-22 DIAGNOSIS — Z794 Long term (current) use of insulin: Secondary | ICD-10-CM | POA: Diagnosis not present

## 2023-03-22 DIAGNOSIS — M79601 Pain in right arm: Secondary | ICD-10-CM

## 2023-03-22 LAB — CBG MONITORING, ED: Glucose-Capillary: 105 mg/dL — ABNORMAL HIGH (ref 70–99)

## 2023-03-22 MED ORDER — LIDOCAINE 5 % EX PTCH: 1.0000 | MEDICATED_PATCH | CUTANEOUS | 0 refills | Status: AC

## 2023-03-22 MED ORDER — PREDNISONE 20 MG PO TABS: 20.0000 mg | ORAL_TABLET | Freq: Every day | ORAL | 0 refills | Status: AC

## 2023-03-22 MED ORDER — KETOROLAC TROMETHAMINE 30 MG/ML IJ SOLN
30.0000 mg | Freq: Once | INTRAMUSCULAR | Status: AC
  Administered 2023-03-22: 30 mg via INTRAMUSCULAR
  Filled 2023-03-22: qty 1

## 2023-03-22 MED ORDER — METHOCARBAMOL 500 MG PO TABS: 500.0000 mg | ORAL_TABLET | Freq: Two times a day (BID) | ORAL | 0 refills | Status: DC

## 2023-03-22 MED ORDER — HYDROCODONE-ACETAMINOPHEN 5-325 MG PO TABS
1.0000 | ORAL_TABLET | Freq: Once | ORAL | Status: DC
  Filled 2023-03-22: qty 1

## 2023-03-22 NOTE — ED Provider Notes (Signed)
 [place] [facility] DE[facility]MENT AT [facility] Provider Note   CSN: [id number] Arrival date & time: 03/22/23  1711    History  Chief Complaint  Patient presents with   Shoulder Pain    [name] is a 73 y.o. female here for evaluation of right shoulder pain.  Started 1 week ago.  Is able to with range of motion and feels like her arm is "frozen."  She has aching diffusely throughout her shoulder which will occasionally radiate into her mid humerus.  No numbness, weakness.  No recent falls or injuries.  No neck pain or neck stiffness.  Left hand dominant.  She does take home oxycodone for chronic pain to her knees and hips.  This has not been helping much.  She uses lidocaine patch which did help earlier today.  She denies any redness, warmth or swelling to the shoulder.  No recent instrumentation such as injections to the shoulder.  No chest pain, back pain.  Blood sugars typically well-controlled at home, around 120 at baseline.     HPI     Home Medications Prior to Admission medications   Medication Sig Start Date End Date Taking? Authorizing Provider  lidocaine (LIDODERM) 5 % Place 1 patch onto the skin daily. Remove & Discard patch within 12 hours or as directed by MD 03/22/23  Yes Kirstein Baxley A, PA-C  methocarbamol (ROBAXIN) 500 MG tablet Take 1 tablet (500 mg total) by mouth 2 (two) times daily. 03/22/23  Yes Dickson Kostelnik A, PA-C  predniSONE (DELTASONE) 20 MG tablet Take 1 tablet (20 mg total) by mouth daily for 5 days. 03/22/23 03/27/23 Yes Makinzee Durley A, PA-C  apixaban (ELIQUIS) 2.5 MG TABS tablet Take 1 tablet (2.5 mg total) by mouth 2 (two) times daily. 12/22/22   Gean Birchwood, MD  gabapentin (NEURONTIN) 100 MG capsule Take 100 mg by mouth 3 (three) times daily. 03/10/18   [provider]  glucose blood (FREESTYLE TEST STRIPS) test strip Use as instructed 04/25/16   Ghimire, Werner Lean, MD  glucose monitoring kit (FREESTYLE) monitoring kit 1  each by Does not apply route 4 (four) times daily - after meals and at bedtime. 1 month Diabetic Testing Supplies for QAC-QHS accuchecks. 04/25/16   Ghimire, Werner Lean, MD  HUMALOG KWIKPEN 100 UNIT/ML KwikPen Inject 20 Units into the skin 2 (two) times daily. Sliding scale 02/12/18   [provider]  insulin glargine (LANTUS) 100 UNIT/ML Solostar Pen Inject 50 Units into the skin at bedtime.    [provider]  Insulin Pen Needle 32G X 8 MM MISC Use as directed 06/18/16   Little Ishikawa, NP  isosorbide-hydrALAZINE (BIDIL) 20-37.5 MG tablet Take 2 tablets by mouth 3 (three) times daily. 09/04/22   [provider]  Lancets (FREESTYLE) lancets Use as instructed 06/18/16   Little Ishikawa, NP  ondansetron (ZOFRAN-ODT) 4 MG disintegrating tablet Take 1 tablet (4 mg total) by mouth every 8 (eight) hours as needed for nausea or vomiting. 01/28/23   Alvira Monday, MD  potassium chloride SA (KLOR-CON M) 20 MEQ tablet Take 2 tablets today after returning home ( ), then tonight before bed, then for 2 days take twice a day. 01/28/23   Alvira Monday, MD  Semaglutide,0.25 or 0.5MG /DOS, (OZEMPIC, 0.25 OR 0.5 MG/DOSE,) 2 MG/1.5ML SOPN Inject 0.5 mg into the skin every Wednesday.    [provider]  Tafamidis (VYNDAMAX) 61 MG CAPS TAKE 1 CAPSULE (61 MG) BY MOUTH DAILY. 05/09/22  Laurey Morale, MD  torsemide (DEMADEX) 100 MG tablet Take 150 mg by mouth daily. 10/16/22   [provider]      Allergies    Morphine and codeine and Penicillins    Review of Systems   Review of Systems  Constitutional: Negative.   HENT: Negative.    Respiratory: Negative.    Cardiovascular: Negative.   Gastrointestinal: Negative.   Genitourinary: Negative.   Musculoskeletal:  Negative for back pain, gait problem, neck pain and neck stiffness.       Right shoulder pain  Skin: Negative.   Neurological: Negative.   All other systems reviewed and are negative.   Physical  Exam Updated Vital Signs BP (!) 156/78 (BP Location: Left Arm)   Pulse 94   Temp 99.4 F (37.4 C) (Oral)   Resp 17   SpO2 96%  Physical Exam Vitals and nursing note reviewed.  Constitutional:      General: She is not in acute distress.    Appearance: She is well-developed. She is not ill-appearing, toxic-appearing or diaphoretic.  HENT:     Head: Atraumatic.  Eyes:     Pupils: Pupils are equal, round, and reactive to light.  Cardiovascular:     Rate and Rhythm: Normal rate.     Pulses: Normal pulses.          Radial pulses are 2+ on the right side and 2+ on the left side.     Heart sounds: Normal heart sounds.  Pulmonary:     Effort: Pulmonary effort is normal. No respiratory distress.     Breath sounds: Normal breath sounds.  Abdominal:     General: Bowel sounds are normal. There is no distension.     Tenderness: There is no abdominal tenderness. There is no guarding.  Musculoskeletal:        General: Normal range of motion.     Cervical back: Normal range of motion.     Comments: No midline C/T tenderness Difficulty with range of motion to right shoulder greater than 45 degrees.  Nontender clavicle, scapula.  No obvious joint effusion.  Tenderness proximal humerus and overlying bursa.  Nontender forearm, olecranon.  Skin:    General: Skin is warm and dry.     Capillary Refill: Capillary refill takes less than 2 seconds.     Comments: No edema, erythema, warmth.  No fluctuance, induration, obvious rashes or lesions  Neurological:     General: No focal deficit present.     Mental Status: She is alert.     Comments: Equal handgrip bilaterally.   Psychiatric:        Mood and Affect: Mood normal.     ED Results / Procedures / Treatments   Labs (all labs ordered are listed, but only abnormal results are displayed) Labs Reviewed  CBG MONITORING, ED - Abnormal; Notable for the following components:      Result Value   Glucose-Capillary 105 (*)    All other components  within normal limits    EKG None  Radiology DG Shoulder Right Result Date: 03/22/2023 CLINICAL DATA:  Shoulder pain EXAM: RIGHT SHOULDER - 2+ VIEW COMPARISON:  None Available. FINDINGS: There is no evidence of fracture or dislocation. Moderate glenohumeral and mild acromioclavicular joint osteoarthritis. Soft tissues are unremarkable. IMPRESSION: Moderate glenohumeral and mild acromioclavicular joint osteoarthritis. No acute findings. Electronically Signed   By: Duanne Guess D.O.   On: 03/22/2023 19:06    Procedures Procedures    Medications Ordered in  ED Medications  HYDROcodone-acetaminophen (NORCO/VICODIN) 5-325 MG per tablet 1 tablet (1 tablet Oral Not Given 03/22/23 2120)  ketorolac (TORADOL) 30 MG/ML injection 30 mg (30 mg Intramuscular Given 03/22/23 2119)    ED Course/ Medical Decision Making/ A&P 73 year old here for evaluation of atraumatic right shoulder pain.  Started 1 week ago.  Feels like the shoulder gets stuck.  She has diffuse pain to her proximal humerus.  She is no clinical evidence of VTE.  She has good pulses bilaterally and appears well-perfused low suspicion for ischemia.  She is no overlying skin changes to the shoulder to suggest cellulitis, abscess.  She has range of motion however has some pain however no obvious joint effusion with suspect strain for gout, septic joint, hemarthrosis.  No back pain, neck pain to suggest radiculopathy.  She denies any recent trauma.  Does a history of diabetes however states this is well-controlled.  She takes oxycodone at home for her chronic hip and knee pain.   Labs and imaging personally viewed and interpreted:  CBG 105 X-ray with moderate osteoarthritis.  No joint effusion, fracture, dislocation  Discussed results with patient.  Could possibly have bursitis versus frozen shoulder versus some sort of ligament or tendon injury.  Given her blood sugars are well-controlled we will start her on short course of steroids.  She  was given a shot of Toradol here, has stable CKD, will not send home on NSAIDs.  She will continue taking her home medicine.  Will add on some muscle relaxers.  Discussed gentle stretching exercises, heat to shoulder.  Lidocaine patches.  I encouraged her to follow-up with her orthopedist whom she follows with for her hips and her knees.  She return for new or worsening symptoms.  Low suspicion for VTE, ischemia, gout, septic joint, occult fracture, dislocation, infectious bursitis, AAA, dissection, radiculopathy, bacterial infectious process.  The patient has been appropriately medically screened and/or stabilized in the ED. I have low suspicion for any other emergent medical condition which would require further screening, evaluation or treatment in the ED or require inpatient management.  Patient is hemodynamically stable and in no acute distress.  Patient able to ambulate in department prior to ED.  Evaluation does not show acute pathology that would require ongoing or additional emergent interventions while in the emergency department or further inpatient treatment.  I have discussed the diagnosis with the patient and answered all questions.  Pain is been managed while in the emergency department and patient has no further complaints prior to discharge.  Patient is comfortable with plan discussed in room and is stable for discharge at this time.  I have discussed strict return precautions for returning to the emergency department.  Patient was encouraged to follow-up with PCP/specialist refer to at discharge.                                 Medical Decision Making Amount and/or Complexity of Data Reviewed External Data Reviewed: radiology and notes. Radiology: ordered and independent interpretation performed. Decision-making details documented in ED Course.  Risk OTC drugs. Prescription drug management. Parenteral controlled substances. Decision regarding hospitalization. Diagnosis or  treatment significantly limited by social determinants of health.          Final Clinical Impression(s) / ED Diagnoses Final diagnoses:  Acute pain of right shoulder    Rx / DC Orders ED Discharge Orders  Ordered    methocarbamol (ROBAXIN) 500 MG tablet  2 times daily        03/22/23 2109    lidocaine (LIDODERM) 5 %  Every 24 hours        03/22/23 2109    predniSONE (DELTASONE) 20 MG tablet  Daily        03/22/23 2109              Dondi Burandt A, PA-C 03/22/23 2134    Elayne Snare K, DO 03/22/23 2353

## 2023-03-22 NOTE — Discharge Instructions (Signed)
 With your severe pain, please go to the emergency department for higher level of care and better pain control.

## 2023-03-22 NOTE — ED Triage Notes (Signed)
 Pt is c/o right side pain from her shoulder to her hip for a week. Pt denies any fall or injury.

## 2023-03-22 NOTE — ED Triage Notes (Signed)
 Pt here with reports of R shoulder pain radiating down her R arm onset appox 1 week ago. Pt with decreased ROM and states she is unable to sleep due to the pain.

## 2023-03-22 NOTE — ED Provider Notes (Signed)
 [facility]    CSN: [id number] Arrival date & time: 03/22/23  1429      History   Chief Complaint Chief Complaint  Patient presents with   Arm Pain    HPI [name] is a 73 y.o. female.  1 week history of right arm pain She feels it from her shoulder down to her hip Reports 10 out of 10 pain, worst of her life Denies injury, trauma, fall  She has reduced kidney function and cannot use NSAIDs.  Also history of diabetes Reports she takes oxycodone but it has not been helping the pain  Past Medical History:  Diagnosis Date   Anemia    a. Noted on 07/2012 labs, instructed to f/u PCP.   Arthritis    "joints" (11/18/2012)   CAD (coronary artery disease), native coronary artery    a. Nonobstructive by cath 02/2012 (done because of low EF).   Chronic bronchitis (HCC)    "~ every other year" (11/18/2012)   Chronic combined systolic and diastolic CHF (congestive heart failure) (HCC)    a. 03/05/12 echo:  LVEF 20-25%, moderate LVH , inferior and basal to mid septal akinesis, anterior moderate to severe hypokinesis and grade 2 diastolic dysfunction. b. EF 07/2012: EF still 25% (unclear medication compliance).   Chronic lower back pain    Headache(784.0)    "often; maybe not daily" (11/18/2012)   High cholesterol    History of noncompliance with medical treatment    Hypertension    LBBB (left bundle branch block)    Orthopnea    Pneumonia    Tobacco abuse    Type II diabetes mellitus (HCC)     Patient Active Problem List   Diagnosis Date Noted   Degenerative arthritis of left knee 12/17/2022   Right foot infection 10/22/2017   Cellulitis in diabetic foot (HCC) 10/22/2017   Hyperglycemia 10/22/2017   Lumbar radiculopathy 09/18/2017   Degenerative spondylolisthesis 09/18/2017   Atrial fibrillation (HCC) 02/11/2017   Paroxysmal A-fib (HCC)    Biventricular automatic implantable cardioverter defibrillator in situ    Sepsis (HCC) 04/20/2016   UTI (urinary tract  infection) 04/20/2016   Dyspnea 07/19/2015   Interstitial lung disease (HCC) 11/20/2014   SIRS (systemic inflammatory response syndrome) (HCC) 02/03/2014   IDDM (insulin dependent diabetes mellitus) 02/03/2014   Tachycardia 06/14/2013   Chronic systolic CHF (congestive heart failure) (HCC) 09/29/2012   Hypoxia 03/06/2012   Hypertensive heart disease 03/06/2012   Nonischemic cardiomyopathy (HCC) 03/06/2012   Tobacco abuse 03/06/2012   Type 2 diabetes mellitus (HCC) 03/06/2012   Hypertension 03/06/2012   CAD (coronary artery disease), native coronary artery 03/06/2012   Hypokalemia 03/06/2012   Acute bronchitis 02/01/2009   Sleep apnea 02/01/2009   Chest pain 02/01/2009    Past Surgical History:  Procedure Laterality Date   BI-VENTRICULAR IMPLANTABLE CARDIOVERTER DEFIBRILLATOR N/A 11/18/2012   Procedure: BI-VENTRICULAR IMPLANTABLE CARDIOVERTER DEFIBRILLATOR  (CRT-D);  Surgeon: Marinus Maw, MD;  Location[facility] CATH LAB;  Service: Cardiovascular;  Laterality: N/A;   BI-VENTRICULAR IMPLANTABLE CARDIOVERTER DEFIBRILLATOR  (CRT-D)  11/18/2012   BIV ICD GENERATOR CHANGEOUT N/A 11/22/2020   Procedure: BIV ICD GENERATOR CHANGEOUT;  Surgeon: Marinus Maw, MD;  Location: [facility] INVASIVE CV LAB;  Service: Cardiovascular;  Laterality: N/A;   CARDIAC CATHETERIZATION  03/04/12   nonobstructive CAD, elevated LVEDP and tortuous vessels suggestive of long-standing hypertension   COLONOSCOPY WITH PROPOFOL N/A 12/12/2016   Procedure: COLONOSCOPY WITH PROPOFOL;  Surgeon: Jeani Hawking, MD;  Location: [facility]  ENDOSCOPY;  Service: Endoscopy;  Laterality: N/A;   JOINT REPLACEMENT     Bilateral hip and right knee   LEFT HEART CATH N/A 03/05/2012   Procedure: LEFT HEART CATH;  Surgeon: Laurey Morale, MD;  Location: [facility] CATH LAB;  Service: Cardiovascular;  Laterality: N/A;   TOTAL KNEE ARTHROPLASTY Left 12/22/2022   Procedure: LEFT TOTAL KNEE ARTHROPLASTY;  Surgeon: Gean Birchwood, MD;  Location: [facility] ORS;  Service:  Orthopedics;  Laterality: Left;    OB History   No obstetric history on file.      Home Medications    Prior to Admission medications   Medication Sig Start Date End Date Taking? Authorizing Provider  apixaban (ELIQUIS) 2.5 MG TABS tablet Take 1 tablet (2.5 mg total) by mouth 2 (two) times daily. 12/22/22   Gean Birchwood, MD  gabapentin (NEURONTIN) 100 MG capsule Take 100 mg by mouth 3 (three) times daily. 03/10/18   [provider]  glucose blood (FREESTYLE TEST STRIPS) test strip Use as instructed 04/25/16   Ghimire, Werner Lean, MD  glucose monitoring kit (FREESTYLE) monitoring kit 1 each by Does not apply route 4 (four) times daily - after meals and at bedtime. 1 month Diabetic Testing Supplies for QAC-QHS accuchecks. 04/25/16   Ghimire, Werner Lean, MD  HUMALOG KWIKPEN 100 UNIT/ML KwikPen Inject 20 Units into the skin 2 (two) times daily. Sliding scale 02/12/18   [provider]  insulin glargine (LANTUS) 100 UNIT/ML Solostar Pen Inject 50 Units into the skin at bedtime.    [provider]  Insulin Pen Needle 32G X 8 MM MISC Use as directed 06/18/16   Little Ishikawa, NP  isosorbide-hydrALAZINE (BIDIL) 20-37.5 MG tablet Take 2 tablets by mouth 3 (three) times daily. 09/04/22   [provider]  Lancets (FREESTYLE) lancets Use as instructed 06/18/16   Little Ishikawa, NP  ondansetron (ZOFRAN-ODT) 4 MG disintegrating tablet Take 1 tablet (4 mg total) by mouth every 8 (eight) hours as needed for nausea or vomiting. 01/28/23   Alvira Monday, MD  potassium chloride SA (KLOR-CON M) 20 MEQ tablet Take 2 tablets today after returning home ( ), then tonight before bed, then for 2 days take twice a day. 01/28/23   Alvira Monday, MD  Semaglutide,0.25 or 0.5MG /DOS, (OZEMPIC, 0.25 OR 0.5 MG/DOSE,) 2 MG/1.5ML SOPN Inject 0.5 mg into the skin every Wednesday.    [provider]  Tafamidis (VYNDAMAX) 61 MG CAPS TAKE 1 CAPSULE (61 MG) BY MOUTH DAILY. 05/09/22    Laurey Morale, MD  tiZANidine (ZANAFLEX) 2 MG tablet Take 1 tablet (2 mg total) by mouth 3 (three) times daily. 12/22/22   Gean Birchwood, MD  torsemide (DEMADEX) 100 MG tablet Take 150 mg by mouth daily. 10/16/22   [provider]    Family History Family History  Problem Relation Age of Onset   Heart failure Mother    Heart disease Neg Hx     Social History Social History   Tobacco Use   Smoking status: Former    Current packs/day: 0.00    Average packs/day: 1 pack/day for 40.0 years (40.0 ttl pk-yrs)    Types: Cigarettes    Start date: 06/23/1972    Quit date: 03/26/2012    Years since quitting: 10.9   Smokeless tobacco: Never  Vaping Use   Vaping status: Never Used  Substance Use Topics   Alcohol use: No   Drug use: No     Allergies   Morphine and  codeine and Penicillins   Review of Systems Review of Systems   Physical Exam Triage Vital Signs ED Triage Vitals [03/22/23 1633]  Encounter Vitals Group     BP (!) 150/85     Systolic BP Percentile      Diastolic BP Percentile      Pulse Rate 100     Resp 18     Temp 97.8 F (36.6 C)     Temp Source Oral     SpO2 99 %     Weight      Height      Head Circumference      Peak Flow      Pain Score 10     Pain Loc      Pain Education      Exclude from Growth Chart    No data found.  Updated Vital Signs BP (!) 150/85 (BP Location: Right Arm)   Pulse 100   Temp 97.8 F (36.6 C) (Oral)   Resp 18   SpO2 99%   Visual Acuity Right Eye Distance:   Left Eye Distance:   Bilateral Distance:    Right Eye Near:   Left Eye Near:    Bilateral Near:     Physical Exam Vitals and nursing note reviewed.  Constitutional:      General: She is not in acute distress. HENT:     Mouth/Throat:     Pharynx: Oropharynx is clear.  Cardiovascular:     Rate and Rhythm: Normal rate and regular rhythm.     Pulses: Normal pulses.     Heart sounds: Normal heart sounds.  Pulmonary:     Effort: Pulmonary  effort is normal.     Breath sounds: Normal breath sounds.  Musculoskeletal:     Right shoulder: Tenderness and bony tenderness present. Decreased range of motion. Normal strength. Normal pulse.     Right forearm: Tenderness present.     Cervical back: Normal range of motion. Tenderness present.  Skin:    Capillary Refill: Capillary refill takes less than 2 seconds.  Neurological:     Mental Status: She is alert and oriented to person, place, and time.     UC Treatments / Results  Labs (all labs ordered are listed, but only abnormal results are displayed) Labs Reviewed - No data to display  EKG  Radiology No results found.  Procedures Procedures (including critical care time)  Medications Ordered in UC Medications - No data to display  Initial Impression / Assessment and Plan / UC Course  I have reviewed the triage vital signs and the nursing notes.  Pertinent labs & imaging results that were available during my care of the patient were reviewed by me and considered in my medical decision making (see chart for details).  Patient flinches away from light palpation of the shoulder.  She has reduced range of motion.  Neurovascularly intact and strength is normal.  Discussed with patient urgent care is limited in pain control options.  I have offered her a steroid injection.  Patient reports she has never had one of those before and she is worried it will not work.  We discussed the limitations of urgent care and if her pain is the worst of her life, she really needs to be evaluated in the emergency department. Patient called nephew to come pick her up and bring her to the ED  Final Clinical Impressions(s) / UC Diagnoses   Final diagnoses:  Right arm pain  Discharge Instructions      With your severe pain, please go to the emergency department for higher level of care and better pain control.    ED Prescriptions   None    PDMP not reviewed this encounter.    Maraya Gwilliam, Lurena Joiner, [place] 03/22/23 1708

## 2023-03-22 NOTE — Discharge Instructions (Addendum)
 Follow-up with your orthopedic doctor  I have written you for a few medications to help with your symptoms  If your blood sugars greater than 200 stop taking the prednisone  Return for new or worsening symptoms

## 2023-03-22 NOTE — ED Provider Triage Note (Signed)
 Emergency Medicine Provider Triage Evaluation Note  [name] , a 73 y.o. female  was evaluated in triage.  Pt complains of shoulder pain.  Review of Systems  Positive:  Negative:  Physical Exam  BP (!) 156/78 (BP Location: Left Arm)   Pulse 94   Temp 99.4 F (37.4 C) (Oral)   Resp 17   SpO2 96%  Gen:   Awake, no distress   Resp:  Normal effort  MSK:   Moves extremities without difficulty  Other:    Medical Decision Making  Medically screening exam initiated at 6:13 PM.  Appropriate orders placed.  [name] was informed that the remainder of the evaluation will be completed by another provider, this initial triage assessment does not replace that evaluation, and the importance of remaining in the ED until their evaluation is complete.  Right shoulder pain despite lack of trauma. Went to UC.    Dorthy Cooler, [place] 03/22/23 [phone]

## 2023-03-22 NOTE — ED Notes (Signed)
 Patient is being discharged from the Urgent Care and sent to the Emergency Department via pov . Per [employer], PA, patient is in need of higher level of care due to limited resources. Patient is aware and verbalizes understanding of plan of care.  Vitals:   03/22/23 1633  BP: (!) 150/85  Pulse: 100  Resp: 18  Temp: 97.8 F (36.6 C)  SpO2: 99%

## 2023-03-24 ENCOUNTER — Other Ambulatory Visit: Payer: Self-pay

## 2023-03-24 NOTE — Progress Notes (Signed)
 Specialty Pharmacy Refill Coordination Note  [name] is a 73 y.o. female contacted today regarding refills of specialty medication(s) Tafamidis [name])   Patient requested Delivery   Delivery date: 03/30/23   Verified address: [address]   [place] [address]   Medication will be filled on 03/27/23.

## 2023-03-27 ENCOUNTER — Telehealth (HOSPITAL_COMMUNITY): Payer: Self-pay

## 2023-03-27 ENCOUNTER — Other Ambulatory Visit: Payer: Self-pay

## 2023-03-27 ENCOUNTER — Other Ambulatory Visit (HOSPITAL_COMMUNITY): Payer: Self-pay

## 2023-03-27 NOTE — Telephone Encounter (Signed)
 Advanced Heart Failure Patient Advocate Encounter  Prior authorization for Vyndamax has been submitted and approved. Test billing returns $0 for 30 day supply.  Key: [id number] Effective: 03/27/2023 to 01/27/2024  [name], CPhT Rx Patient Advocate Phone: [phone]

## 2023-04-01 ENCOUNTER — Ambulatory Visit: Payer: 59

## 2023-04-01 DIAGNOSIS — I428 Other cardiomyopathies: Secondary | ICD-10-CM | POA: Diagnosis not present

## 2023-04-03 LAB — CUP PACEART REMOTE DEVICE CHECK
Battery Remaining Longevity: 61 mo
Battery Voltage: 2.98 V
Brady Statistic AP VP Percent: 0.08 %
Brady Statistic AP VS Percent: 0.01 %
Brady Statistic AS VP Percent: 97.72 %
Brady Statistic AS VS Percent: 2.19 %
Brady Statistic RA Percent Paced: 0.09 %
Brady Statistic RV Percent Paced: 23.58 %
Date Time Interrogation Session: 20250305022825
HighPow Impedance: 47 Ohm
Implantable Lead Connection Status: 753985
Implantable Lead Connection Status: 753985
Implantable Lead Connection Status: 753985
Implantable Lead Implant Date: 20141023
Implantable Lead Implant Date: 20141023
Implantable Lead Implant Date: 20141023
Implantable Lead Location: 753858
Implantable Lead Location: 753859
Implantable Lead Location: 753860
Implantable Lead Model: 4298
Implantable Lead Model: 5076
Implantable Lead Model: 6935
Implantable Pulse Generator Implant Date: 20221027
Lead Channel Impedance Value: 142.5 Ohm
Lead Channel Impedance Value: 142.5 Ohm
Lead Channel Impedance Value: 142.5 Ohm
Lead Channel Impedance Value: 155.455
Lead Channel Impedance Value: 155.455
Lead Channel Impedance Value: 285 Ohm
Lead Channel Impedance Value: 285 Ohm
Lead Channel Impedance Value: 285 Ohm
Lead Channel Impedance Value: 342 Ohm
Lead Channel Impedance Value: 361 Ohm
Lead Channel Impedance Value: 361 Ohm
Lead Channel Impedance Value: 418 Ohm
Lead Channel Impedance Value: 418 Ohm
Lead Channel Impedance Value: 475 Ohm
Lead Channel Impedance Value: 475 Ohm
Lead Channel Impedance Value: 475 Ohm
Lead Channel Impedance Value: 513 Ohm
Lead Channel Impedance Value: 532 Ohm
Lead Channel Pacing Threshold Amplitude: 0.5 V
Lead Channel Pacing Threshold Amplitude: 0.625 V
Lead Channel Pacing Threshold Amplitude: 1.375 V
Lead Channel Pacing Threshold Pulse Width: 0.4 ms
Lead Channel Pacing Threshold Pulse Width: 0.4 ms
Lead Channel Pacing Threshold Pulse Width: 0.4 ms
Lead Channel Sensing Intrinsic Amplitude: 3 mV
Lead Channel Sensing Intrinsic Amplitude: 3 mV
Lead Channel Sensing Intrinsic Amplitude: 8.5 mV
Lead Channel Sensing Intrinsic Amplitude: 8.5 mV
Lead Channel Setting Pacing Amplitude: 1.75 V
Lead Channel Setting Pacing Amplitude: 2.5 V
Lead Channel Setting Pacing Amplitude: 2.5 V
Lead Channel Setting Pacing Pulse Width: 0.4 ms
Lead Channel Setting Pacing Pulse Width: 0.4 ms
Lead Channel Setting Sensing Sensitivity: 0.45 mV
Zone Setting Status: 755011
Zone Setting Status: 755011

## 2023-04-07 DIAGNOSIS — Z79891 Long term (current) use of opiate analgesic: Secondary | ICD-10-CM | POA: Diagnosis not present

## 2023-04-07 DIAGNOSIS — G894 Chronic pain syndrome: Secondary | ICD-10-CM | POA: Diagnosis not present

## 2023-04-09 NOTE — Progress Notes (Signed)
 [facility] - Clinic Note  04/21/2023     CHIEF COMPLAINT Patient presents for Retina Follow Up  HISTORY OF PRESENT ILLNESS: [name] is a 73 y.o. female who presents to the clinic today for:   HPI     Retina Follow Up   Patient presents with  Other.  In right eye.  This started 1 year ago.  I, the attending physician,  performed the HPI with the patient and updated documentation appropriately.        Comments   Patient here for 1 year (14 months) retina follow up for BRAO OD. Patient states vision doing ok. No eye pain. Was on some drops from Dr Dione Booze but has ran out of. Has glasses on order.      Last edited by Rennis Chris, MD on 04/21/2023  9:01 PM.    Pt states she is not having any problems with her vision, no new health concerns, she saw Dr. Dione Booze last month and was given drops, she goes back in 2 weeks for Yag Cap OS  Referring physician: [facility] [address] [place],  [place] [address]  HISTORICAL INFORMATION:   Selected notes from the MEDICAL RECORD NUMBER Referred by Dr. Wynona Luna for concern of BRVO OD / ERM OD LEE: 02.10.20 (S. Bernstorf) [BCVA: OD: 20/25- OS: 20/25] Ocular Hx-DES OU, narrow angles PMH-DM (metformin, novolin, lantus), HTN, HLD, heart disease   CURRENT MEDICATIONS: Current Outpatient Medications (Ophthalmic Drugs)  Medication Sig   latanoprost (XALATAN) 0.005 % ophthalmic solution 1 drop at bedtime.   No current facility-administered medications for this visit. (Ophthalmic Drugs)   Current Outpatient Medications (Other)  Medication Sig   apixaban (ELIQUIS) 2.5 MG TABS tablet Take 1 tablet (2.5 mg total) by mouth 2 (two) times daily.   atorvastatin (LIPITOR) 40 MG tablet Take 40 mg by mouth daily.   Calcium Carb-Cholecalciferol (CALCIUM 600+D3) 600-10 MG-MCG TABS 1 tablet with a meal Orally Once a day for 30 day(s)   carvedilol (COREG) 25 MG tablet Take 25 mg by mouth 2 (two)  times daily with a meal.   ELIQUIS 5 MG TABS tablet take 1 tablet Oral twice a day for 90 days   ENTRESTO 97-103 MG TAKE 1 TABLET BY MOUTH TWICE DAILY Oral for 30 Days   gabapentin (NEURONTIN) 100 MG capsule Take 100 mg by mouth 3 (three) times daily.   glucose blood (FREESTYLE TEST STRIPS) test strip Use as instructed   glucose monitoring kit (FREESTYLE) monitoring kit 1 each by Does not apply route 4 (four) times daily - after meals and at bedtime. 1 month Diabetic Testing Supplies for QAC-QHS accuchecks.   HUMALOG KWIKPEN 100 UNIT/ML KwikPen Inject 20 Units into the skin 2 (two) times daily. Sliding scale   insulin glargine (LANTUS) 100 UNIT/ML Solostar Pen Inject 50 Units into the skin at bedtime.   Insulin Pen Needle 32G X 8 MM MISC Use as directed   isosorbide-hydrALAZINE (BIDIL) 20-37.5 MG tablet Take 2 tablets by mouth 3 (three) times daily.   Lancets (FREESTYLE) lancets Use as instructed   lidocaine (LIDODERM) 5 % Place 1 patch onto the skin daily. Remove & Discard patch within 12 hours or as directed by MD   meloxicam (MOBIC) 15 MG tablet Take 15 mg by mouth daily.   metFORMIN (GLUCOPHAGE) 1000 MG tablet Oral   ondansetron (ZOFRAN-ODT) 4 MG disintegrating tablet Take 1 tablet (4 mg total) by mouth every 8 (  eight) hours as needed for nausea or vomiting.   oxyCODONE (OXY IR/ROXICODONE) 5 MG immediate release tablet Take 5 mg by mouth every 6 (six) hours as needed.   OZEMPIC, 1 MG/DOSE, 4 MG/3ML SOPN Inject 1 mg into the skin once a week.   potassium chloride (KLOR-CON) 10 MEQ tablet Oral   Tafamidis (VYNDAMAX) 61 MG CAPS TAKE 1 CAPSULE (61 MG) BY MOUTH DAILY.   torsemide (DEMADEX) 100 MG tablet Take 100 mg by mouth daily.   No current facility-administered medications for this visit. (Other)   REVIEW OF SYSTEMS: ROS   Positive for: Endocrine, Cardiovascular, Eyes, Respiratory Negative for: Constitutional, Gastrointestinal, Neurological, Skin, Genitourinary, Musculoskeletal, HENT,  Psychiatric, Allergic/Imm, Heme/Lymph Last edited by Laddie Aquas, COA on 04/21/2023 12:48 PM.     ALLERGIES Allergies  Allergen Reactions   Morphine And Codeine Nausea Only    Severe nausea   Penicillins Other (See Comments)    Pt states she has had a pain in her leg since a penicillin injection 2 months ago (reported 08/31/17).  Has tolerated amoxicillin oral.     PAST MEDICAL HISTORY Past Medical History:  Diagnosis Date   Anemia    a. Noted on 07/2012 labs, instructed to f/u PCP.   Arthritis    "joints" (11/18/2012)   CAD (coronary artery disease), native coronary artery    a. Nonobstructive by cath 02/2012 (done because of low EF).   Chronic bronchitis (HCC)    "~ every other year" (11/18/2012)   Chronic combined systolic and diastolic CHF (congestive heart failure) (HCC)    a. 03/05/12 echo:  LVEF 20-25%, moderate LVH , inferior and basal to mid septal akinesis, anterior moderate to severe hypokinesis and grade 2 diastolic dysfunction. b. EF 07/2012: EF still 25% (unclear medication compliance).   Chronic lower back pain    Headache(784.0)    "often; maybe not daily" (11/18/2012)   High cholesterol    History of noncompliance with medical treatment    Hypertension    LBBB (left bundle branch block)    Orthopnea    Pneumonia    Tobacco abuse    Type II diabetes mellitus (HCC)    Past Surgical History:  Procedure Laterality Date   BI-VENTRICULAR IMPLANTABLE CARDIOVERTER DEFIBRILLATOR N/A 11/18/2012   Procedure: BI-VENTRICULAR IMPLANTABLE CARDIOVERTER DEFIBRILLATOR  (CRT-D);  Surgeon: Marinus Maw, MD;  Location: [facility] CATH LAB;  Service: Cardiovascular;  Laterality: N/A;   BI-VENTRICULAR IMPLANTABLE CARDIOVERTER DEFIBRILLATOR  (CRT-D)  11/18/2012   BIV ICD GENERATOR CHANGEOUT N/A 11/22/2020   Procedure: BIV ICD GENERATOR CHANGEOUT;  Surgeon: Marinus Maw, MD;  Location: [facility] INVASIVE CV LAB;  Service: Cardiovascular;  Laterality: N/A;   CARDIAC CATHETERIZATION  03/04/12    nonobstructive CAD, elevated LVEDP and tortuous vessels suggestive of long-standing hypertension   COLONOSCOPY WITH PROPOFOL N/A 12/12/2016   Procedure: COLONOSCOPY WITH PROPOFOL;  Surgeon: Jeani Hawking, MD;  Location: [facility] ENDOSCOPY;  Service: Endoscopy;  Laterality: N/A;   JOINT REPLACEMENT     Bilateral hip and right knee   LEFT HEART CATH N/A 03/05/2012   Procedure: LEFT HEART CATH;  Surgeon: Laurey Morale, MD;  Location: [facility] CATH LAB;  Service: Cardiovascular;  Laterality: N/A;   TOTAL KNEE ARTHROPLASTY Left 12/22/2022   Procedure: LEFT TOTAL KNEE ARTHROPLASTY;  Surgeon: Gean Birchwood, MD;  Location: [facility] ORS;  Service: Orthopedics;  Laterality: Left;   FAMILY HISTORY Family History  Problem Relation Age of Onset   Heart failure Mother    Heart disease Neg  Hx    SOCIAL HISTORY Social History   Tobacco Use   Smoking status: Former    Current packs/day: 0.00    Average packs/day: 1 pack/day for 40.0 years (40.0 ttl pk-yrs)    Types: Cigarettes    Start date: 06/23/1972    Quit date: 03/26/2012    Years since quitting: 11.0   Smokeless tobacco: Never  Vaping Use   Vaping status: Never Used  Substance Use Topics   Alcohol use: No   Drug use: No       OPHTHALMIC EXAM:  Base Eye Exam     Visual Acuity (Snellen - Linear)       Right Left   Dist Curtis 20/30 -2 20/25 -1         Tonometry (Tonopen, 12:45 PM)       Right Left   Pressure 15 17         Pupils       Dark Light Shape React APD   Right 2 1 Round Minimal None   Left 2 1 Round Minimal None         Visual Fields (Counting fingers)       Left Right    Full Full         Extraocular Movement       Right Left    Full, Ortho Full, Ortho         Neuro/Psych     Oriented x3: Yes   Mood/Affect: Normal         Dilation     Both eyes: 1.0% Mydriacyl, 2.5% Phenylephrine @ 12:45 PM           Slit Lamp and Fundus Exam     Slit Lamp Exam       Right Left   Lids/Lashes Dermatochalasis -  upper lid Dermatochalasis - upper lid   Conjunctiva/Sclera nasal Pinguecula, Melanosis nasal/temporal Pinguecula, Melanosis   Cornea Trace Punctate epithelial erosions, Well healed  IT cataract wounds, mild arcus, mild tear film debris Trace punctate epithelial erosions, well healed temporal cataract wounds, arcus, mild tear film debris   Anterior Chamber deep and clear deep and clear   Iris Round and dilated, No NVI Round and dilated, No NVI   Lens PC IOL in perfect position, 1+ peripheral Posterior capsular opacification PC IOL in good position, 2+ PCO inferiorly   Anterior Vitreous Vitreous syneresis Vitreous syneresis         Fundus Exam       Right Left   Disc +cupping, pallor, mild tilt, temporal PPA, Sharp, thin inferior rim sharp rim, +cupping, temporal PPA, mild tilt, Pallor   C/D Ratio 0.7 0.7   Macula Flat, Blunted foveal reflex, 3-4+ERM with +striae, no heme Good foveal reflex, trace cystic changes inferior macula, rare MA   Vessels attenuation, veins dilated/tortuous, AV crossing changes, white, sclerotic arteriole / BRAO at 1200 - good laser surrounding attenuated, Tortuous, Copper wiring   Periphery Attached, fibrotic NV at 1200 -- regressing ?seafan, good segmental PRP laser changes in area of BRAO and NV, focal blot hemes superior periphery - improved;  pigmented cystoid degeneration at 0600, No heme Attached, No heme           IMAGING AND PROCEDURES  Imaging and Procedures for @TODAY @  OCT, Retina - OU - Both Eyes       Right Eye Quality was good. Central Foveal Thickness: 501. Progression has been stable. Findings include no IRF, no SRF, abnormal foveal contour,  epiretinal membrane, macular pucker, preretinal fibrosis (Thick ERM and macular pucker -- stable).   Left Eye Quality was good. Central Foveal Thickness: 234. Progression has been stable. Findings include normal foveal contour, no IRF, no SRF, vitreomacular adhesion (Focal trace cystic changes inferior  macula).   Notes *Images captured and stored on drive  Diagnosis / Impression:  OD: Thick ERM w/ pucker -- stable OS: NFP, no IRF/SRF, mild VMA -- Focal trace cystic changes inferior macula  Clinical management:  See below  Abbreviations: NFP - Normal foveal profile. CME - cystoid macular edema. PED - pigment epithelial detachment. IRF - intraretinal fluid. SRF - subretinal fluid. EZ - ellipsoid zone. ERM - epiretinal membrane. ORA - outer retinal atrophy. ORT - outer retinal tubulation. SRHM - subretinal hyper-reflective material             ASSESSMENT/PLAN:    ICD-10-CM   1. BRAO (branch retinal artery occlusion), right  H34.231     2. Proliferative retinopathy of right eye  H35.21     3. Epiretinal membrane (ERM) of right eye  H35.371 OCT, Retina - OU - Both Eyes    4. Moderate nonproliferative diabetic retinopathy of both eyes without macular edema associated with type 2 diabetes mellitus (HCC)  O13.0865     5. Essential hypertension  I10     6. Hypertensive retinopathy of both eyes  H35.033     7. Pseudophakia of both eyes  Z96.1      1,2. BRAO w/ proliferative retinopathy -- stably improved / inactive  - sclerotic superior arteriole with distal fibrotic NV (?sea fan) and overlying preretinal and vitreous heme stably improved  - etiology could also be DM2 vs sickle cell retinopathy -- sickle cell screen negative  - s/p segmental PRP OD (2.17.2020) + fill-in PRP OD 7.13.20 - good laser changes in place  - repeat FA (3.12.21) shows interval improvement in NV and leakage  - improved view post cataract surgery - no retinal or ophthalmic interventions indicated or recommended   - f/u 1 yr - DFE/OCT  3. Epiretinal membrane, OD -- stable  - thick ERM overlying retina with pucker and distorted foveal profile  - BCVA 20/40              - OCT shows thick ERM -- stable from prior  - discussed finding and prognosis and treatment options in detail  - pt happy with vision  OD and wishes to continue to monitor ERM for now  - f/u 1 yr, sooner prn  4. Moderate nonproliferative diabetic retinopathy w/o DME, OU  - A1c: 6.7 on 11.13.24  - exam shows scattered MA OU, no NV  - FA (02.17.20) shows no NV OS  - OCT without diabetic macular edema, OU  - monitor   5,6. Hypertensive retinopathy OU  - discussed importance of tight BP control             - monitor   7. Pseudophakia OU  - s/p CE/IOL (OD: Dr. Zetta Bills, Jun 10, 2018, OS: Dr. Zetta Bills, September 16, 2018 )  - beautiful surgeries, doing well  - monitor   Ophthalmic Meds Ordered this visit:  No orders of the defined types were placed in this encounter.    Return in about 1 year (around 04/20/2024) for f/u BRAO OD, DFE, OCT.  This document serves as a record of services personally performed by Karie Chimera, MD, PhD. It was created on their behalf by Charlette Caffey, COT  an ophthalmic technician. The creation of this record is the provider's dictation and/or activities during the visit.    Electronically signed by:  Charlette Caffey, COT  04/21/23 9:08 PM  This document serves as a record of services personally performed by Karie Chimera, MD, PhD. It was created on their behalf by Glee Arvin. Manson Passey, OA an ophthalmic technician. The creation of this record is the provider's dictation and/or activities during the visit.    Electronically signed by: Glee Arvin. Manson Passey, OA 04/21/23 9:08 PM  Karie Chimera, M.D., Ph.D. Diseases & Surgery of the Retina and Vitreous [facility]  I have reviewed the above documentation for accuracy and completeness, and I agree with the above. Karie Chimera, M.D., Ph.D. 04/21/23 9:09 PM   Abbreviations: M myopia (nearsighted); A astigmatism; H hyperopia (farsighted); P presbyopia; Mrx spectacle prescription;  CTL contact lenses; OD right eye; OS left eye; OU both eyes  XT exotropia; ET esotropia; PEK punctate epithelial keratitis; PEE punctate  epithelial erosions; DES dry eye syndrome; MGD meibomian gland dysfunction; ATs artificial tears; PFAT's preservative free artificial tears; NSC nuclear sclerotic cataract; PSC posterior subcapsular cataract; ERM epi-retinal membrane; PVD posterior vitreous detachment; RD retinal detachment; DM diabetes mellitus; DR diabetic retinopathy; NPDR non-proliferative diabetic retinopathy; PDR proliferative diabetic retinopathy; CSME clinically significant macular edema; DME diabetic macular edema; dbh dot blot hemorrhages; CWS cotton wool spot; POAG primary open angle glaucoma; C/D cup-to-disc ratio; HVF humphrey visual field; GVF goldmann visual field; OCT optical coherence tomography; IOP intraocular pressure; BRVO Branch retinal vein occlusion; CRVO central retinal vein occlusion; CRAO central retinal artery occlusion; BRAO branch retinal artery occlusion; RT retinal tear; SB scleral buckle; PPV pars plana vitrectomy; VH Vitreous hemorrhage; PRP panretinal laser photocoagulation; IVK intravitreal kenalog; VMT vitreomacular traction; MH Macular hole;  NVD neovascularization of the disc; NVE neovascularization elsewhere; AREDS age related eye disease study; ARMD age related macular degeneration; POAG primary open angle glaucoma; EBMD epithelial/anterior basement membrane dystrophy; ACIOL anterior chamber intraocular lens; IOL intraocular lens; PCIOL posterior chamber intraocular lens; Phaco/IOL phacoemulsification with intraocular lens placement; PRK photorefractive keratectomy; LASIK laser assisted in situ keratomileusis; HTN hypertension; DM diabetes mellitus; COPD chronic obstructive pulmonary disease

## 2023-04-14 ENCOUNTER — Telehealth (HOSPITAL_COMMUNITY): Payer: Self-pay

## 2023-04-14 DIAGNOSIS — E1159 Type 2 diabetes mellitus with other circulatory complications: Secondary | ICD-10-CM | POA: Diagnosis not present

## 2023-04-14 DIAGNOSIS — E1142 Type 2 diabetes mellitus with diabetic polyneuropathy: Secondary | ICD-10-CM | POA: Diagnosis not present

## 2023-04-14 DIAGNOSIS — E113393 Type 2 diabetes mellitus with moderate nonproliferative diabetic retinopathy without macular edema, bilateral: Secondary | ICD-10-CM | POA: Diagnosis not present

## 2023-04-14 DIAGNOSIS — I5042 Chronic combined systolic (congestive) and diastolic (congestive) heart failure: Secondary | ICD-10-CM | POA: Diagnosis not present

## 2023-04-14 DIAGNOSIS — E538 Deficiency of other specified B group vitamins: Secondary | ICD-10-CM | POA: Diagnosis not present

## 2023-04-14 DIAGNOSIS — Z794 Long term (current) use of insulin: Secondary | ICD-10-CM | POA: Diagnosis not present

## 2023-04-14 DIAGNOSIS — I110 Hypertensive heart disease with heart failure: Secondary | ICD-10-CM | POA: Diagnosis not present

## 2023-04-14 DIAGNOSIS — I700 Atherosclerosis of aorta: Secondary | ICD-10-CM | POA: Diagnosis not present

## 2023-04-14 NOTE — Telephone Encounter (Signed)
 Called to confirm/remind patient of their appointment at the [facility] on 04/15/23.   Patient reminded to bring all medications and/or complete list.  Confirmed patient has transportation. Gave directions, instructed to utilize valet parking.  Confirmed appointment prior to ending call.

## 2023-04-14 NOTE — Progress Notes (Signed)
 Patient ID: [name], female   DOB: 1950-10-21, 73 y.o.   MRN: [id number]   Advanced [facility] Failure Clinic Note   PCP: Dr. Ronne Binning Cardiology: Dr Charisse Klinefelter is a 73 y.o. female with history of nonischemic cardiomyopathy.  She was admitted with CHF exacerbation in 02/2012.  EF 20-25% on echo, LHC with nonobstructive CAD.  She was started on cardiac meds and discharged.  In 07/2012, she was admitted again with CHF exacerbation.  She had run out of Lasix.  She was taking her other heart medications as ordered, however.  She was diuresed and discharged. She had a chronic LBBB, and Medtronic CRT-D device was placed in 10/14.  She was admitted in 6/16 with hypertensive emergency and CHF exacerbation.   Also of note, she had PFTs in 9/16 showing restrictive spirometry with low lung volume and DLCO.  She was supposed to get a high resolution CT to evaluate for interstitial lung disease but never had the study.     Admitted March 2018 with urosepsis--> E Coli Bacteremia. Completed antibiotic course. EF was down from previous to 30-35%. Also had atrial fibrillation so she was loaded on amiodarone. Placed on eliquis. Discharge weight was 208 pounds.  She is now off amiodarone.   Admitted to [facility] 1/16 -> 02/13/16 with CP and dizziness. Found to be in Afib. Converted spontaneously overnight with BB and diuresis.   Echo 11/19 showed EF 35-40%.  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    Patient had COVID-19 infection in 10/20.   Echo in 11/20 showed EF 40-45% with mild LVH and mildly decreased RV systolic function.  Echo in 2/22 showed EF up to 55-60%, moderate LVH, normal RV. Echo 5/23 showed EF 45-50%, with severe LVH  Seen in the ED 09/06/21 with CP. AHF consulted. Hs-trop negative, BNP 415. Given IV lasix and discharged home.  Seenin the ED 03/09/22 for cough and dyspnea. RVP negative for COVID, flu, RSV.  BNP 273.4. CXR no acute  cardiopulmonary abnormalities. Symptoms felt 2/2 mild acute CHF. Did not receive Lasix in ED. Instructed to increase torsemide to 60 mg daily and f/u w/ outpatient cardiology team.   Today she returns for HF follow up. Overall feeling fine. She has SOB with heavy housework and walking up steps. Denies  palpitations, abnormal bleeding, CP, dizziness, edema, or PND/Orthopnea. Appetite ok. No fever or chills. Weight at home 180 pounds. She is unsure of what meds she is taking, Entresto, spiro and Farxig not in med bag today. Walks outside for exercise.  ECG (personally reviewed from 01/30/23): NSR, RBBB  MDT interrogation (personally reviewed): OptiVol stable, 4 hr/day activity, <0.1 hr/day AF burden, 97.8 % VP  Labs (2/14): SPEP negative, UPEP negative, HIV negative Labs (2/23): K 4.3, creatinine 1.01, LDL 94, HDL 47 Labs (8/23): K 3.8, creatinine 0.90  Labs (2/24): K 3.5, creatinine 1.08,  BNP 273 Labs (1/25): K 2.8, creatinine 1.29  PMH: 1. HTN 2. Type II diabetes with neuropathy 3. Nonischemic cardiomyopathy: ? Due to HTN versus LBBB CMP.  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  Echo (2/14) with EF 20-25%.  Echo (7/14) with EF 25%, diffuse hypokinesis.  HIV, SPEP, UPEP negative.  Has LBBB. CRT-D 10/2012 (Medtronic).  Echo (4/15) with EF 45-50%, mild diffuse hypokinesis, PA systolic pressure 38 mmHg.  Echo (6/16) with EF 40-45%, mild LVH, septal and inferior hypokinesis.   - Echo (3/18): EF 30-35%. Grade 1 DD -  Echo (6/18): EF 45-50%, moderate LVH, normal RV size with mildly decreased systolic function.  - Echo (11/19): EF 35-40%, moderate LVH, moderate diastolic dysfunction, normal RV size with mildly decreased systolic function, PASP 43 mmHg.  - Echo (11/20): EF 40-45%, mild LVH, mildly decreased RV systolic function.  - Echo (2/22): EF 55-60%, moderate LVH, normal RV.  - Echo (5/23): EF 45-50%, severe LVH, normal RV 4. Chronic LBBB 5. Right TKR 6. Bilateral THR.  7. Hyperlipidemia 8. ?COPD:  Has oxygen for use with exertion.  - PFTs (9/16) with FEV1 77%, FVC 76%, ratio 101%, TLC 63%, DLCO 41% => moderate restrictive deficit  9. Atrial fibrillation: Paroxysmal.  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  11. COVID-19 infection in 10/20.  12. OSA: CPAP. 13. OA: Knees  SH: Prior smoker, quit 2/14.  Never drank ETOH.  No drugs. Lives with son.   FH: Mother with "heart trouble."   Review of systems complete and found to be negative unless listed in HPI.    Current Outpatient Medications  Medication Sig Dispense Refill   apixaban (ELIQUIS) 2.5 MG TABS tablet Take 1 tablet (2.5 mg total) by mouth 2 (two) times daily. 30 tablet 0   carvedilol (COREG) 25 MG tablet Take 25 mg by mouth 2 (two) times daily with a meal.     gabapentin (NEURONTIN) 100 MG capsule Take 100 mg by mouth 3 (three) times daily.     glucose blood (FREESTYLE TEST STRIPS) test strip Use as instructed 100 each 0   glucose monitoring kit (FREESTYLE) monitoring kit 1 each by Does not apply route 4 (four) times daily - after meals and at bedtime. 1 month Diabetic Testing Supplies for QAC-QHS accuchecks. 1 each 1   HUMALOG KWIKPEN 100 UNIT/ML KwikPen Inject 20 Units into the skin 2 (two) times daily. Sliding scale     insulin glargine (LANTUS) 100 UNIT/ML Solostar Pen Inject 50 Units into the skin at bedtime.     Insulin Pen Needle 32G X 8 MM MISC Use as directed 100 each 0   isosorbide-hydrALAZINE (BIDIL) 20-37.5 MG tablet Take 2 tablets by mouth 3 (three) times daily.     Lancets (FREESTYLE) lancets Use as instructed 100 each 0   lidocaine (LIDODERM) 5 % Place 1 patch onto the skin daily. Remove & Discard patch within 12 hours or as directed by MD 30 patch 0   ondansetron (ZOFRAN-ODT) 4 MG disintegrating tablet Take 1 tablet (4 mg total) by mouth every 8 (eight) hours as needed for nausea or vomiting. 15 tablet 0    Semaglutide,0.25 or 0.5MG /DOS, (OZEMPIC, 0.25 OR 0.5 MG/DOSE,) 2 MG/1.5ML SOPN Inject 0.5 mg into the skin every Wednesday.     Tafamidis (VYNDAMAX) 61 MG CAPS TAKE 1 CAPSULE (61 MG) BY MOUTH DAILY. 90 capsule 3   torsemide (DEMADEX) 100 MG tablet Take 100 mg by mouth daily.     No current facility-administered medications for this encounter.   BP 128/64   Pulse 90   Wt 84 kg (185 lb 3.2 oz)   SpO2 99%   BMI 29.01 kg/m   Wt Readings from Last 3 Encounters:  04/15/23 84 kg (185 lb 3.2 oz)  01/28/23 101.2 kg (223 lb)  12/22/22 98.4 kg (217 lb)    PHYSICAL EXAM: General:  NAD. No resp difficulty, walked into clinic HEENT: Normal Neck: Supple. No JVD. Cor: Regular rate & rhythm. No rubs, gallops  or murmurs. Lungs: Clear, LLL diminished Abdomen: Soft, nontender, nondistended.  Extremities: No cyanosis, clubbing, rash, edema Neuro: Alert & oriented x 3, moves all 4 extremities w/o difficulty. Affect pleasant.  Assessment/Plan: 1. Chronic systolic CHF: Nonischemic cardiomyopathy, thought to be related to HTN. S/P CRT-D (Medtronic). Echo (2/22) with EF improved up to 55-60%. Echo 5/23 showed EF 45-50%, severe LVH. NYHA class II-early III. She is not volume overloaded by exam - Entresto, spiro nor Marcelline Deist are in her med bag today. She is unsure of what meds she takes but has more pill bottles at home. We called her pharmacy and several meds not filled x 3 months. Will call her later today when she gets home to get accurate med rec. - Continue torsemide 100 mg daily. BMET today. - Continue Coreg 25 mg bid.  - Continue Bidil 2 tabs tid. - Update echo next visit. 2. Hyperlipidemia: Continue statin. Check LFTs/lipids today. 3. HTN: BP well controlled. 4. PAF: No prolonged episodes on device interrogation.    - Continue Eliquis 2.5 mg bid. No bleeding issues. CBC & iron panel today. 5. 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 Genetic testing was  negative (wild type).  - Continue tafamidis.   Follow up in 3 months with Dr. Shirlee Latch + echo. If she is not taking all GDMT when we call to do her med rec, will bring back sooner for APP or PharmD appt to re-initiate.   Anderson Malta [place], [place]  04/15/2023

## 2023-04-15 ENCOUNTER — Encounter (HOSPITAL_COMMUNITY): Payer: Self-pay

## 2023-04-15 ENCOUNTER — Telehealth (HOSPITAL_COMMUNITY): Payer: Self-pay

## 2023-04-15 ENCOUNTER — Ambulatory Visit (HOSPITAL_COMMUNITY)
Admission: RE | Admit: 2023-04-15 | Discharge: 2023-04-15 | Disposition: A | Discharge status: Home / Self Care | Payer: 59 | Source: Ambulatory Visit | Attending: Family Medicine | Admitting: Family Medicine

## 2023-04-15 VITALS — BP 128/64 | HR 90 | Wt 185.2 lb

## 2023-04-15 DIAGNOSIS — I5022 Chronic systolic (congestive) heart failure: Secondary | ICD-10-CM | POA: Diagnosis not present

## 2023-04-15 DIAGNOSIS — I10 Essential (primary) hypertension: Secondary | ICD-10-CM | POA: Diagnosis not present

## 2023-04-15 DIAGNOSIS — Z95810 Presence of automatic (implantable) cardiac defibrillator: Secondary | ICD-10-CM | POA: Insufficient documentation

## 2023-04-15 DIAGNOSIS — I428 Other cardiomyopathies: Secondary | ICD-10-CM | POA: Insufficient documentation

## 2023-04-15 DIAGNOSIS — E119 Type 2 diabetes mellitus without complications: Secondary | ICD-10-CM | POA: Insufficient documentation

## 2023-04-15 DIAGNOSIS — I2510 Atherosclerotic heart disease of native coronary artery without angina pectoris: Secondary | ICD-10-CM | POA: Diagnosis not present

## 2023-04-15 DIAGNOSIS — Z7985 Long-term (current) use of injectable non-insulin antidiabetic drugs: Secondary | ICD-10-CM | POA: Insufficient documentation

## 2023-04-15 DIAGNOSIS — E785 Hyperlipidemia, unspecified: Secondary | ICD-10-CM | POA: Diagnosis not present

## 2023-04-15 DIAGNOSIS — I43 Cardiomyopathy in diseases classified elsewhere: Secondary | ICD-10-CM | POA: Diagnosis not present

## 2023-04-15 DIAGNOSIS — I110 Hypertensive heart disease with heart failure: Secondary | ICD-10-CM | POA: Diagnosis not present

## 2023-04-15 DIAGNOSIS — Z79899 Other long term (current) drug therapy: Secondary | ICD-10-CM | POA: Diagnosis not present

## 2023-04-15 DIAGNOSIS — I480 Paroxysmal atrial fibrillation: Secondary | ICD-10-CM | POA: Insufficient documentation

## 2023-04-15 DIAGNOSIS — E854 Organ-limited amyloidosis: Secondary | ICD-10-CM

## 2023-04-15 DIAGNOSIS — Z87891 Personal history of nicotine dependence: Secondary | ICD-10-CM | POA: Insufficient documentation

## 2023-04-15 DIAGNOSIS — Z7901 Long term (current) use of anticoagulants: Secondary | ICD-10-CM | POA: Diagnosis not present

## 2023-04-15 DIAGNOSIS — Z794 Long term (current) use of insulin: Secondary | ICD-10-CM | POA: Insufficient documentation

## 2023-04-15 LAB — COMPREHENSIVE METABOLIC PANEL
ALT: 7 U/L (ref 0–44)
AST: 16 U/L (ref 15–41)
Albumin: 3.7 g/dL (ref 3.5–5.0)
Alkaline Phosphatase: 55 U/L (ref 38–126)
Anion gap: 12 (ref 5–15)
BUN: 18 mg/dL (ref 8–23)
CO2: 24 mmol/L (ref 22–32)
Calcium: 9.2 mg/dL (ref 8.9–10.3)
Chloride: 103 mmol/L (ref 98–111)
Creatinine, Ser: 1 mg/dL (ref 0.44–1.00)
GFR, Estimated: >60 mL/min (ref >60)
Glucose, Bld: 109 mg/dL — ABNORMAL HIGH (ref 70–99)
Potassium: 3.5 mmol/L (ref 3.5–5.1)
Sodium: 139 mmol/L (ref 135–145)
Total Bilirubin: 0.3 mg/dL (ref 0.0–1.2)
Total Protein: 7.2 g/dL (ref 6.5–8.1)

## 2023-04-15 LAB — LIPID PANEL
Cholesterol: 170 mg/dL (ref 0–200)
HDL: 51 mg/dL (ref >40)
LDL Cholesterol: 107 mg/dL — ABNORMAL HIGH (ref 0–99)
Total CHOL/HDL Ratio: 3.3 ratio
Triglycerides: 58 mg/dL (ref <150)
VLDL: 12 mg/dL (ref 0–40)

## 2023-04-15 LAB — CBC
HCT: 32.6 % — ABNORMAL LOW (ref 36.0–46.0)
Hemoglobin: 11 g/dL — ABNORMAL LOW (ref 12.0–15.0)
MCH: 30.1 pg (ref 26.0–34.0)
MCHC: 33.7 g/dL (ref 30.0–36.0)
MCV: 89.3 fL (ref 80.0–100.0)
Platelets: 429 10*3/uL — ABNORMAL HIGH (ref 150–400)
RBC: 3.65 MIL/uL — ABNORMAL LOW (ref 3.87–5.11)
RDW: 13.2 % (ref 11.5–15.5)
WBC: 8.5 10*3/uL (ref 4.0–10.5)
nRBC: 0 % (ref 0.0–0.2)

## 2023-04-15 LAB — IRON AND TIBC
Iron: 70 ug/dL (ref 28–170)
Saturation Ratios: 22 % (ref 10.4–31.8)
TIBC: 316 ug/dL (ref 250–450)
UIBC: 246 ug/dL

## 2023-04-15 LAB — FERRITIN: Ferritin: 61 ng/mL (ref 11–307)

## 2023-04-15 NOTE — Progress Notes (Unsigned)
  Electrophysiology Office Note:   Date:  04/16/2023  ID:  [name], DOB Jun 12, 1950, MRN [id number]  Primary Cardiologist: Marca Ancona, MD Primary Heart Failure: None Electrophysiologist: Lewayne Bunting, MD       History of Present Illness:   [name] is a 73 y.o. female with h/o HFrEF / NICM s/p BiV ICD, non-obs CAD, LBBB, HTN, cardiac amyloidosis, restrictive lung disease, DM II, OSA on CPAP seen today for routine electrophysiology followup.   Since last being seen in our clinic the patient reports doing very well.  She just saw the CHF Team.  No concerns related to her device.  Asks about her home monitor transmissions.     She denies chest pain, palpitations, dyspnea, PND, orthopnea, nausea, vomiting, dizziness, syncope, edema, weight gain, or early satiety.   Review of systems complete and found to be negative unless listed in HPI.  EP Information / Studies Reviewed:    EKG is not ordered today. EKG from 01/30/23 reviewed which showed SB 51 BPM      ICD Interrogation-  reviewed in detail today,  See PACEART report.  Device History: Medtronic BiV ICD implanted 11/18/2012 for HFrEF, LBBB History of appropriate therapy: No History of AAD therapy:  tolerated IV amiodarone while inpatient     Risk Assessment/Calculations:    CHA2DS2-VASc Score = 6   This indicates a 9.7% annual risk of stroke. The patient's score is based upon: CHF History: 1 HTN History: 1 Diabetes History: 1 Stroke History: 0 Vascular Disease History: 1 Age Score: 1 Gender Score: 1             Physical Exam:   VS:  BP 90/60   Pulse (!) 104   Ht 5\' 7"  (1.702 m)   Wt 203 lb 6.4 oz (92.3 kg)   SpO2 97%   BMI 31.86 kg/m    Wt Readings from Last 3 Encounters:  04/16/23 203 lb 6.4 oz (92.3 kg)  04/15/23 185 lb 3.2 oz (84 kg)  01/28/23 223 lb (101.2 kg)     GEN: Well nourished, well developed in no acute distress NECK: No JVD; No carotid bruits CARDIAC: Regular rate and rhythm, no  murmurs, rubs, gallops, device site wnl, no tethering  RESPIRATORY:  Clear to auscultation without rales, wheezing or rhonchi  ABDOMEN: Soft, non-tender, non-distended EXTREMITIES:  No edema; No deformity   ASSESSMENT AND PLAN:    Chronic Systolic Dysfunction / NICM, LBBB s/p Medtronic CRT-D  Cardiac Amyloidosis  -euvolemic on exam -Stable on an appropriate medical regimen -Normal ICD function -See Pace Art report -B>AX reprogrammed   -GDMT / amyloidosis care per Advanced HF Team   Paroxysmal Atrial Fibrillation  CHA2DS2-VASc 6 -OAC for stroke prophylaxis  -<0.1% burden on device   Secondary Hypercoagulable State  -continue Eliquis 5mg  BID, dose reviewed and appropriate by age/wt  Hypertension  -well controlled on current regimen   OSA  -CPAP compliance encouraged      Disposition:   Follow up with Dr. Ladona Ridgel or EP APP in 6 months   Signed, Canary Brim, NP-C, AGACNP-BC [facility] - Electrophysiology  04/16/2023, 9:56 AM

## 2023-04-15 NOTE — Patient Instructions (Signed)
 Medication Changes:  No medication changes today!  Lab Work:  Labs done today, your results will be available in MyChart, we will contact you for abnormal readings.   Testing/Procedures:  Your physician has requested that you have an echocardiogram. Echocardiography is a painless test that uses sound waves to create images of your heart. It provides your doctor with information about the size and shape of your heart and how well your heart's chambers and valves are working. This procedure takes approximately one hour. There are no restrictions for this procedure. Please do NOT wear cologne, perfume, aftershave, or lotions (deodorant is allowed). Please arrive 15 minutes prior to your appointment time.  Please note: We ask at that you not bring children with you during ultrasound (echo/ vascular) testing. Due to room size and safety concerns, children are not allowed in the ultrasound rooms during exams. Our front office staff cannot provide observation of children in our lobby area while testing is being conducted. An adult accompanying a patient to their appointment will only be allowed in the ultrasound room at the discretion of the ultrasound technician under special circumstances. We apologize for any inconvenience.   Follow-Up in: Please follow up with the [facility] with Dr. Shirlee Latch.  At the [facility], you and your health needs are our priority. We have a designated team specialized in the treatment of Heart Failure. This Care Team includes your primary Heart Failure Specialized Cardiologist (physician), Advanced Practice Providers (APPs- Physician Assistants and Nurse Practitioners), and Pharmacist who all work together to provide you with the care you need, when you need it.   You may see any of the following providers on your designated Care Team at your next follow up:  Dr. Arvilla Meres Dr. Marca Ancona Dr. Dorthula Nettles Dr. Theresia Bough Tonye Becket, NP Robbie Lis, [place] [facility] [place], [place] Brynda Peon, NP [place] Lee, NP Karle Plumber, PharmD   Please be sure to bring in all your medications bottles to every appointment.   Need to Contact us:  If you have any questions or concerns before your next appointment please send [place] a message through [place] or call our office at [phone].    TO LEAVE A MESSAGE FOR THE NURSE SELECT OPTION 2, PLEASE LEAVE A MESSAGE INCLUDING: YOUR NAME DATE OF BIRTH CALL BACK NUMBER REASON FOR CALL**this is important as we prioritize the call backs  YOU WILL RECEIVE A CALL BACK THE SAME DAY AS LONG AS YOU CALL BEFORE 4:00 PM

## 2023-04-15 NOTE — Telephone Encounter (Signed)
 Called patient per JM to ask if pt is taking Westley Foots and [place] medication and the dosage and how often.   Patient was at another appointment and asks to be called in the morning on 04/16/23.  Called patient and she has not been taking Westley Foots, or [place].

## 2023-04-16 ENCOUNTER — Ambulatory Visit: Attending: Pulmonary Disease | Admitting: Pulmonary Disease

## 2023-04-16 ENCOUNTER — Encounter: Payer: Self-pay | Admitting: Pulmonary Disease

## 2023-04-16 VITALS — BP 90/60 | HR 104 | Ht 67.0 in | Wt 203.4 lb

## 2023-04-16 DIAGNOSIS — I5022 Chronic systolic (congestive) heart failure: Secondary | ICD-10-CM | POA: Diagnosis not present

## 2023-04-16 DIAGNOSIS — I480 Paroxysmal atrial fibrillation: Secondary | ICD-10-CM | POA: Diagnosis not present

## 2023-04-16 DIAGNOSIS — Z95810 Presence of automatic (implantable) cardiac defibrillator: Secondary | ICD-10-CM | POA: Diagnosis not present

## 2023-04-16 LAB — CUP PACEART INCLINIC DEVICE CHECK
Date Time Interrogation Session: 20250320095309
Implantable Lead Connection Status: 753985
Implantable Lead Connection Status: 753985
Implantable Lead Connection Status: 753985
Implantable Lead Implant Date: 20141023
Implantable Lead Implant Date: 20141023
Implantable Lead Implant Date: 20141023
Implantable Lead Location: 753858
Implantable Lead Location: 753859
Implantable Lead Location: 753860
Implantable Lead Model: 4298
Implantable Lead Model: 5076
Implantable Lead Model: 6935
Implantable Pulse Generator Implant Date: 20221027

## 2023-04-16 NOTE — Patient Instructions (Signed)
 Medication Instructions:  Your physician recommends that you continue on your current medications as directed. Please refer to the Current Medication list given to you today.  *If you need a refill on your cardiac medications before your next appointment, please call your pharmacy*  Lab Work: None ordered If you have labs (blood work) drawn today and your tests are completely normal, you will receive your results only by: MyChart Message (if you have MyChart) OR A paper copy in the mail If you have any lab test that is abnormal or we need to change your treatment, we will call you to review the results.  Follow-Up: At [facility], you and your health needs are our priority.  As part of our continuing mission to provide you with exceptional heart care, we have created designated Provider Care Teams.  These Care Teams include your primary Cardiologist (physician) and Advanced Practice Providers (APPs -  Physician Assistants and Nurse Practitioners) who all work together to provide you with the care you need, when you need it.  We recommend signing up for the patient portal called "MyChart".  Sign up information is provided on this After Visit Summary.  MyChart is used to connect with patients for Virtual Visits (Telemedicine).  Patients are able to view lab/test results, encounter notes, upcoming appointments, etc.  Non-urgent messages can be sent to your provider as well.   To learn more about what you can do with MyChart, go to [email or web address].    Your next appointment:   6 month(s)  Provider:   Canary Brim, NP

## 2023-04-20 ENCOUNTER — Other Ambulatory Visit (HOSPITAL_COMMUNITY): Payer: Self-pay

## 2023-04-20 ENCOUNTER — Other Ambulatory Visit: Payer: Self-pay

## 2023-04-20 NOTE — Progress Notes (Signed)
 Specialty Pharmacy Refill Coordination Note  [name] is a 73 y.o. female contacted today regarding refills of specialty medication(s) Tafamidis [name])   Patient requested Delivery   Delivery date: 04/22/23   Verified address: [address]   [place] [address]   Medication will be filled on 04/21/23.

## 2023-04-21 ENCOUNTER — Encounter (INDEPENDENT_AMBULATORY_CARE_PROVIDER_SITE_OTHER): Payer: Self-pay | Admitting: Ophthalmology

## 2023-04-21 ENCOUNTER — Other Ambulatory Visit: Payer: Self-pay

## 2023-04-21 ENCOUNTER — Ambulatory Visit (INDEPENDENT_AMBULATORY_CARE_PROVIDER_SITE_OTHER): Payer: 59 | Admitting: Ophthalmology

## 2023-04-21 DIAGNOSIS — E113393 Type 2 diabetes mellitus with moderate nonproliferative diabetic retinopathy without macular edema, bilateral: Secondary | ICD-10-CM | POA: Diagnosis not present

## 2023-04-21 DIAGNOSIS — H3521 Other non-diabetic proliferative retinopathy, right eye: Secondary | ICD-10-CM

## 2023-04-21 DIAGNOSIS — H35371 Puckering of macula, right eye: Secondary | ICD-10-CM | POA: Diagnosis not present

## 2023-04-21 DIAGNOSIS — H34231 Retinal artery branch occlusion, right eye: Secondary | ICD-10-CM | POA: Diagnosis not present

## 2023-04-21 DIAGNOSIS — H35033 Hypertensive retinopathy, bilateral: Secondary | ICD-10-CM

## 2023-04-21 DIAGNOSIS — Z961 Presence of intraocular lens: Secondary | ICD-10-CM

## 2023-04-21 DIAGNOSIS — I10 Essential (primary) hypertension: Secondary | ICD-10-CM | POA: Diagnosis not present

## 2023-04-22 ENCOUNTER — Encounter: Payer: Self-pay | Admitting: Cardiology

## 2023-04-23 DIAGNOSIS — H40023 Open angle with borderline findings, high risk, bilateral: Secondary | ICD-10-CM | POA: Diagnosis not present

## 2023-04-23 DIAGNOSIS — Z961 Presence of intraocular lens: Secondary | ICD-10-CM | POA: Diagnosis not present

## 2023-05-05 DIAGNOSIS — M25562 Pain in left knee: Secondary | ICD-10-CM | POA: Diagnosis not present

## 2023-05-05 DIAGNOSIS — Z79891 Long term (current) use of opiate analgesic: Secondary | ICD-10-CM | POA: Diagnosis not present

## 2023-05-05 DIAGNOSIS — G894 Chronic pain syndrome: Secondary | ICD-10-CM | POA: Diagnosis not present

## 2023-05-15 ENCOUNTER — Other Ambulatory Visit: Payer: Self-pay

## 2023-05-15 ENCOUNTER — Other Ambulatory Visit (HOSPITAL_COMMUNITY): Payer: Self-pay | Admitting: Cardiology

## 2023-05-15 NOTE — Progress Notes (Signed)
 Specialty Pharmacy Refill Coordination Note  [name] is a 73 y.o. female contacted today regarding refills of specialty medication(s) Tafamidis  (Vyndamax )   Patient requested Delivery   Delivery date: 05/20/23   Verified address: [address]   [place] [address]   Medication will be filled on 05/19/23.

## 2023-05-15 NOTE — Progress Notes (Signed)
 Specialty Pharmacy Ongoing Clinical Assessment Note  [name] is a 73 y.o. female who is being followed by the specialty pharmacy service for RxSp Cardiology   Patient's specialty medication(s) reviewed today: Tafamidis  (Vyndamax )   Missed doses in the last 4 weeks: 0   Patient/Caregiver did not have any additional questions or concerns.   Therapeutic benefit summary: Patient is achieving benefit   Adverse events/side effects summary: No adverse events/side effects   Patient's therapy is appropriate to: Continue    Goals Addressed             This Visit's Progress    Avoid hospitalizations and surgery       Patient is on track. Patient will maintain adherence.  Goal is to decrease cardiac related admissions, no recent cardiac admissions.         Follow up:  6 months  Marvell Stavola M [place] Specialty Pharmacist

## 2023-05-18 ENCOUNTER — Other Ambulatory Visit: Payer: Self-pay

## 2023-05-18 ENCOUNTER — Other Ambulatory Visit (HOSPITAL_COMMUNITY): Payer: Self-pay | Admitting: Cardiology

## 2023-05-18 MED ORDER — VYNDAMAX 61 MG PO CAPS
61.0000 mg | ORAL_CAPSULE | Freq: Every day | ORAL | 3 refills | Status: DC
  Filled 2023-05-18: qty 30, 30d supply, fill #0
  Filled 2023-06-15: qty 30, 30d supply, fill #1
  Filled 2023-07-13: qty 30, 30d supply, fill #2
  Filled 2023-08-10: qty 30, 30d supply, fill #3
  Filled 2023-09-10: qty 30, 30d supply, fill #4
  Filled 2023-10-07: qty 30, 30d supply, fill #5

## 2023-05-19 ENCOUNTER — Other Ambulatory Visit: Payer: Self-pay

## 2023-05-20 NOTE — Progress Notes (Signed)
 Remote ICD transmission.

## 2023-05-20 NOTE — Addendum Note (Signed)
 Addended by: Lott Rouleau A on: 05/20/2023 10:26 AM   Modules accepted: Orders

## 2023-06-02 ENCOUNTER — Ambulatory Visit (HOSPITAL_COMMUNITY)

## 2023-06-02 ENCOUNTER — Encounter (HOSPITAL_COMMUNITY): Admitting: Internal Medicine

## 2023-06-02 DIAGNOSIS — Z79891 Long term (current) use of opiate analgesic: Secondary | ICD-10-CM | POA: Diagnosis not present

## 2023-06-02 DIAGNOSIS — G894 Chronic pain syndrome: Secondary | ICD-10-CM | POA: Diagnosis not present

## 2023-06-02 DIAGNOSIS — M25562 Pain in left knee: Secondary | ICD-10-CM | POA: Diagnosis not present

## 2023-06-10 ENCOUNTER — Other Ambulatory Visit (HOSPITAL_COMMUNITY): Payer: Self-pay

## 2023-06-15 ENCOUNTER — Other Ambulatory Visit: Payer: Self-pay

## 2023-06-15 ENCOUNTER — Other Ambulatory Visit: Payer: Self-pay | Admitting: Pharmacy Technician

## 2023-06-15 NOTE — Progress Notes (Signed)
 Specialty Pharmacy Refill Coordination Note  [name] is a 73 y.o. female contacted today regarding refills of specialty medication(s) Tafamidis  (Vyndamax )   Patient requested Delivery   Delivery date: 06/17/23   Verified address: [address] [name], [place] [address]   Medication will be filled on 06/16/23.

## 2023-06-16 ENCOUNTER — Other Ambulatory Visit: Payer: Self-pay

## 2023-06-16 ENCOUNTER — Other Ambulatory Visit (HOSPITAL_COMMUNITY): Payer: Self-pay

## 2023-06-17 DIAGNOSIS — Z1389 Encounter for screening for other disorder: Secondary | ICD-10-CM | POA: Diagnosis not present

## 2023-06-17 DIAGNOSIS — I5042 Chronic combined systolic (congestive) and diastolic (congestive) heart failure: Secondary | ICD-10-CM | POA: Diagnosis not present

## 2023-06-17 DIAGNOSIS — Z7984 Long term (current) use of oral hypoglycemic drugs: Secondary | ICD-10-CM | POA: Diagnosis not present

## 2023-06-17 DIAGNOSIS — Z7985 Long-term (current) use of injectable non-insulin antidiabetic drugs: Secondary | ICD-10-CM | POA: Diagnosis not present

## 2023-06-17 DIAGNOSIS — Z9989 Dependence on other enabling machines and devices: Secondary | ICD-10-CM | POA: Diagnosis not present

## 2023-06-17 DIAGNOSIS — Z0000 Encounter for general adult medical examination without abnormal findings: Secondary | ICD-10-CM | POA: Diagnosis not present

## 2023-06-17 DIAGNOSIS — E113393 Type 2 diabetes mellitus with moderate nonproliferative diabetic retinopathy without macular edema, bilateral: Secondary | ICD-10-CM | POA: Diagnosis not present

## 2023-07-01 ENCOUNTER — Ambulatory Visit (INDEPENDENT_AMBULATORY_CARE_PROVIDER_SITE_OTHER): Payer: 59

## 2023-07-01 DIAGNOSIS — I5022 Chronic systolic (congestive) heart failure: Secondary | ICD-10-CM

## 2023-07-01 LAB — CUP PACEART REMOTE DEVICE CHECK
Battery Remaining Longevity: 56 mo
Battery Voltage: 2.97 V
Brady Statistic AP VP Percent: 0.18 %
Brady Statistic AP VS Percent: 0.01 %
Brady Statistic AS VP Percent: 97.04 %
Brady Statistic AS VS Percent: 2.77 %
Brady Statistic RA Percent Paced: 0.19 %
Brady Statistic RV Percent Paced: 21.4 %
Date Time Interrogation Session: 20250604043725
HighPow Impedance: 59 Ohm
Implantable Lead Connection Status: 753985
Implantable Lead Connection Status: 753985
Implantable Lead Connection Status: 753985
Implantable Lead Implant Date: 20141023
Implantable Lead Implant Date: 20141023
Implantable Lead Implant Date: 20141023
Implantable Lead Location: 753858
Implantable Lead Location: 753859
Implantable Lead Location: 753860
Implantable Lead Model: 4298
Implantable Lead Model: 5076
Implantable Lead Model: 6935
Implantable Pulse Generator Implant Date: 20221027
Lead Channel Impedance Value: 142.5 Ohm
Lead Channel Impedance Value: 147.097
Lead Channel Impedance Value: 147.097
Lead Channel Impedance Value: 147.097
Lead Channel Impedance Value: 152 Ohm
Lead Channel Impedance Value: 285 Ohm
Lead Channel Impedance Value: 285 Ohm
Lead Channel Impedance Value: 304 Ohm
Lead Channel Impedance Value: 304 Ohm
Lead Channel Impedance Value: 361 Ohm
Lead Channel Impedance Value: 399 Ohm
Lead Channel Impedance Value: 399 Ohm
Lead Channel Impedance Value: 456 Ohm
Lead Channel Impedance Value: 513 Ohm
Lead Channel Impedance Value: 513 Ohm
Lead Channel Impedance Value: 513 Ohm
Lead Channel Impedance Value: 513 Ohm
Lead Channel Impedance Value: 532 Ohm
Lead Channel Pacing Threshold Amplitude: 0.5 V
Lead Channel Pacing Threshold Amplitude: 0.5 V
Lead Channel Pacing Threshold Amplitude: 1.875 V
Lead Channel Pacing Threshold Pulse Width: 0.4 ms
Lead Channel Pacing Threshold Pulse Width: 0.4 ms
Lead Channel Pacing Threshold Pulse Width: 0.4 ms
Lead Channel Sensing Intrinsic Amplitude: 10.875 mV
Lead Channel Sensing Intrinsic Amplitude: 10.875 mV
Lead Channel Sensing Intrinsic Amplitude: 2.25 mV
Lead Channel Sensing Intrinsic Amplitude: 2.25 mV
Lead Channel Setting Pacing Amplitude: 1.5 V
Lead Channel Setting Pacing Amplitude: 2.5 V
Lead Channel Setting Pacing Amplitude: 2.75 V
Lead Channel Setting Pacing Pulse Width: 0.4 ms
Lead Channel Setting Pacing Pulse Width: 0.4 ms
Lead Channel Setting Sensing Sensitivity: 0.45 mV
Zone Setting Status: 755011
Zone Setting Status: 755011

## 2023-07-02 ENCOUNTER — Ambulatory Visit: Payer: Self-pay | Admitting: Internal Medicine

## 2023-07-07 DIAGNOSIS — G894 Chronic pain syndrome: Secondary | ICD-10-CM | POA: Diagnosis not present

## 2023-07-07 DIAGNOSIS — M25562 Pain in left knee: Secondary | ICD-10-CM | POA: Diagnosis not present

## 2023-07-07 DIAGNOSIS — Z79891 Long term (current) use of opiate analgesic: Secondary | ICD-10-CM | POA: Diagnosis not present

## 2023-07-10 ENCOUNTER — Other Ambulatory Visit: Payer: Self-pay

## 2023-07-13 ENCOUNTER — Other Ambulatory Visit: Payer: Self-pay

## 2023-07-13 ENCOUNTER — Other Ambulatory Visit: Payer: Self-pay | Admitting: Pharmacy Technician

## 2023-07-13 NOTE — Progress Notes (Signed)
 Specialty Pharmacy Refill Coordination Note  [name] is a 73 y.o. female contacted today regarding refills of specialty medication(s) Tafamidis  (Vyndamax )   Patient requested Delivery   Delivery date: 07/17/23   Verified address: [address]  [place]   Medication will be filled on 07/16/23.

## 2023-07-15 ENCOUNTER — Other Ambulatory Visit: Payer: Self-pay

## 2023-07-15 NOTE — Progress Notes (Signed)
 Patient ID: [name], female   DOB: 1950-01-30, 73 y.o.   MRN: [id number]   Advanced Heart Failure Clinic Note   PCP: Dr. Claretta Croft Cardiology: Dr Norva Beecham is a 73 y.o. female with history of nonischemic cardiomyopathy.  She was admitted with CHF exacerbation in 02/2012.  EF 20-25% on echo, LHC with nonobstructive CAD.  She was started on cardiac meds and discharged.  In 07/2012, she was admitted again with CHF exacerbation.  She had run out of Lasix .  She was taking her other heart medications as ordered, however.  She was diuresed and discharged. She had a chronic LBBB, and Medtronic CRT-D device was placed in 10/14.  She was admitted in 6/16 with hypertensive emergency and CHF exacerbation.   Also of note, she had PFTs in 9/16 showing restrictive spirometry with low lung volume and DLCO.  She was supposed to get a high resolution CT to evaluate for interstitial lung disease but never had the study.     Admitted March 2018 with urosepsis--> E Coli Bacteremia. Completed antibiotic course. EF was down from previous to 30-35%. Also had atrial fibrillation so she was loaded on amiodarone . Placed on eliquis . Discharge weight was 208 pounds.  She is now off amiodarone .   Admitted to [facility] 1/16 -> 02/13/16 with CP and dizziness. Found to be in Afib. Converted spontaneously overnight with BB and diuresis.   Echo 11/19 showed EF 35-40%.  PYP scan in 12/19 suggestive of transthyretin amyloidosis, she is now being treated with tafamidis .    Patient had COVID-19 infection in 10/20.   Echo in 11/20 showed EF 40-45% with mild LVH and mildly decreased RV systolic function.  Echo in 2/22 showed EF up to 55-60%, moderate LVH, normal RV. Echo 5/23 showed EF 45-50%, with severe LVH  Seen in the ED 09/06/21 with CP. AHF consulted. Hs-trop negative, BNP 415. Given IV lasix  and discharged home.  Seen in the ED 03/09/22 for cough and dyspnea. RVP negative for COVID, flu, RSV.  BNP 273.4. CXR no acute  cardiopulmonary abnormalities. Symptoms felt 2/2 mild acute CHF. Did not receive Lasix  in ED. Instructed to increase torsemide to 60 mg daily and f/u w/ outpatient cardiology team.   Today she returns for HF follow up. Overall feeling fine. She has SOB with heavy housework and walking up steps. Denies  palpitations, abnormal bleeding, CP, dizziness, edema, or PND/Orthopnea. Appetite ok. No fever or chills. Weight at home 180 pounds. She is unsure of what meds she is taking, Entresto , spiro and Farxig not in med bag today. Walks outside for exercise.  ECG (personally reviewed from 01/30/23): NSR, RBBB  MDT interrogation (personally reviewed): OptiVol stable, 4 hr/day activity, <0.1 hr/day AF burden, 97.8 % VP  Labs (2/14): SPEP negative, UPEP negative, HIV negative Labs (2/23): K 4.3, creatinine 1.01, LDL 94, HDL 47 Labs (8/23): K 3.8, creatinine 0.90  Labs (2/24): K 3.5, creatinine 1.08,  BNP 273 Labs (1/25): K 2.8, creatinine 1.29  PMH: 1. HTN 2. Type II diabetes with neuropathy 3. Nonischemic cardiomyopathy: ? Due to HTN versus LBBB CMP.  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  Echo (2/14) with EF 20-25%.  Echo (7/14) with EF 25%, diffuse hypokinesis.  HIV, SPEP, UPEP negative.  Has LBBB. CRT-D 10/2012 (Medtronic).  Echo (4/15) with EF 45-50%, mild diffuse hypokinesis, PA systolic pressure 38 mmHg.  Echo (6/16) with EF 40-45%, mild LVH, septal and inferior hypokinesis.   - Echo (3/18): EF 30-35%. Grade 1 DD -  Echo (6/18): EF 45-50%, moderate LVH, normal RV size with mildly decreased systolic function.  - Echo (11/19): EF 35-40%, moderate LVH, moderate diastolic dysfunction, normal RV size with mildly decreased systolic function, PASP 43 mmHg.  - Echo (11/20): EF 40-45%, mild LVH, mildly decreased RV systolic function.  - Echo (2/22): EF 55-60%, moderate LVH, normal RV.  - Echo (5/23): EF 45-50%, severe LVH, normal RV 4. Chronic LBBB 5. Right TKR 6. Bilateral THR.  7. Hyperlipidemia 8. ?COPD:  Has oxygen for use with exertion.  - PFTs (9/16) with FEV1 77%, FVC 76%, ratio 101%, TLC 63%, DLCO 41% => moderate restrictive deficit  9. Atrial fibrillation: Paroxysmal.  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  11. COVID-19 infection in 10/20.  12. OSA: CPAP. 13. OA: Knees  SH: Prior smoker, quit 2/14.  Never drank ETOH.  No drugs. Lives with son.   FH: Mother with heart trouble.   Review of systems complete and found to be negative unless listed in HPI.    Current Outpatient Medications  Medication Sig Dispense Refill   apixaban  (ELIQUIS ) 2.5 MG TABS tablet Take 1 tablet (2.5 mg total) by mouth 2 (two) times daily. 30 tablet 0   atorvastatin  (LIPITOR) 40 MG tablet Take 40 mg by mouth daily.     Calcium  Carb-Cholecalciferol (CALCIUM  600+D3) 600-10 MG-MCG TABS 1 tablet with a meal Orally Once a day for 30 day(s)     carvedilol  (COREG ) 25 MG tablet Take 25 mg by mouth 2 (two) times daily with a meal.     ELIQUIS  5 MG TABS tablet take 1 tablet Oral twice a day for 90 days     ENTRESTO  97-103 MG TAKE 1 TABLET BY MOUTH TWICE DAILY Oral for 30 Days     gabapentin  (NEURONTIN ) 100 MG capsule Take 100 mg by mouth 3 (three) times daily.     glucose blood (FREESTYLE TEST STRIPS) test strip Use as instructed 100 each 0   glucose monitoring kit (FREESTYLE) monitoring kit 1 each by Does not apply route 4 (four) times daily - after meals and at bedtime. 1 month Diabetic Testing Supplies for QAC-QHS accuchecks. 1 each 1   HUMALOG KWIKPEN 100 UNIT/ML KwikPen Inject 20 Units into the skin 2 (two) times daily. Sliding scale     insulin  glargine (LANTUS ) 100 UNIT/ML Solostar Pen Inject 50 Units into the skin at bedtime.     Insulin  Pen Needle 32G X 8 MM MISC Use as directed 100 each 0   isosorbide -hydrALAZINE  (BIDIL ) 20-37.5 MG tablet Take 2 tablets by mouth 3 (three) times daily.     Lancets (FREESTYLE)  lancets Use as instructed 100 each 0   latanoprost (XALATAN) 0.005 % ophthalmic solution 1 drop at bedtime.     lidocaine  (LIDODERM ) 5 % Place 1 patch onto the skin daily. Remove & Discard patch within 12 hours or as directed by MD 30 patch 0   meloxicam (MOBIC) 15 MG tablet Take 15 mg by mouth daily.     metFORMIN  (GLUCOPHAGE ) 1000 MG tablet Oral     ondansetron  (ZOFRAN -ODT) 4 MG disintegrating tablet Take 1 tablet (4 mg total) by mouth every 8 (eight) hours as needed for nausea or vomiting. 15 tablet 0   oxyCODONE  (OXY IR/ROXICODONE ) 5 MG immediate release tablet Take 5 mg by mouth every 6 (six) hours as needed.     OZEMPIC, 1 MG/DOSE, 4 MG/3ML SOPN Inject 1  mg into the skin once a week.     potassium chloride  (KLOR-CON ) 10 MEQ tablet Oral     Tafamidis  (VYNDAMAX ) 61 MG CAPS TAKE 1 CAPSULE (61 MG) BY MOUTH DAILY. 90 capsule 3   torsemide (DEMADEX) 100 MG tablet Take 100 mg by mouth daily.     No current facility-administered medications for this visit.   There were no vitals taken for this visit.  Wt Readings from Last 3 Encounters:  04/16/23 92.3 kg (203 lb 6.4 oz)  04/15/23 84 kg (185 lb 3.2 oz)  01/28/23 101.2 kg (223 lb)    PHYSICAL EXAM: General:  Well appearing. No respiratory difficulty HEENT: normal Neck: supple. no JVD. Carotids 2+ bilat; no bruits. No lymphadenopathy or thyromegaly appreciated. Cor: PMI nondisplaced. Regular rate & rhythm. No rubs, gallops or murmurs. Lungs: clear Abdomen: soft, nontender, nondistended. No hepatosplenomegaly. No bruits or masses. Good bowel sounds. Extremities: no cyanosis, clubbing, rash, edema Neuro: alert & oriented x 3, cranial nerves grossly intact. moves all 4 extremities w/o difficulty. Affect pleasant.   Assessment/Plan: 1. Chronic systolic CHF: Nonischemic cardiomyopathy, thought to be related to HTN. S/P CRT-D (Medtronic). Echo (2/22) with EF improved up to 55-60%. Echo 5/23 showed EF 45-50%, severe LVH. NYHA class II-early III.  She is not volume overloaded by exam - Entresto , spiro nor Farxiga  are in her med bag today. She is unsure of what meds she takes but has more pill bottles at home. We called her pharmacy and several meds not filled x 3 months. Will call her later today when she gets home to get accurate med rec. - Continue torsemide 100 mg daily. BMET today. - Continue Coreg  25 mg bid.  - Continue Bidil  2 tabs tid. - Update echo next visit. 2. Hyperlipidemia: Continue statin. Check LFTs/lipids today. 3. HTN: BP well controlled. 4. PAF: No prolonged episodes on device interrogation.    - Continue Eliquis  2.5 mg bid. No bleeding issues. CBC & iron panel today. 5. 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 Genetic testing was negative (wild type).  - Continue tafamidis .   Follow up in 3 months with Dr. Mitzie Anda + echo. If she is not taking all GDMT when we call to do her med rec, will bring back sooner for APP or PharmD appt to re-initiate.   Ruddy Corral, PA-C  07/15/2023

## 2023-07-16 ENCOUNTER — Telehealth (HOSPITAL_COMMUNITY): Payer: Self-pay

## 2023-07-16 NOTE — Telephone Encounter (Signed)
 Called to confirm/remind patient of their appointment at the [facility] on 07/17/23.   Appointment:   [x] Confirmed  [] Left mess   [] No answer/No voice mail  [] VM Full/unable to leave message  [] Phone not in service  Patient reminded to bring all medications and/or complete list.  Confirmed patient has transportation. Gave directions, instructed to utilize valet parking.

## 2023-07-17 ENCOUNTER — Encounter (HOSPITAL_COMMUNITY): Payer: Self-pay

## 2023-07-17 ENCOUNTER — Ambulatory Visit (HOSPITAL_BASED_OUTPATIENT_CLINIC_OR_DEPARTMENT_OTHER)
Admission: RE | Admit: 2023-07-17 | Discharge: 2023-07-17 | Disposition: A | Discharge status: Home / Self Care | Source: Ambulatory Visit | Attending: Cardiology | Admitting: Cardiology

## 2023-07-17 ENCOUNTER — Ambulatory Visit (HOSPITAL_COMMUNITY)
Admission: RE | Admit: 2023-07-17 | Discharge: 2023-07-17 | Disposition: A | Discharge status: Home / Self Care | Source: Ambulatory Visit | Attending: Family Medicine | Admitting: Family Medicine

## 2023-07-17 VITALS — BP 138/82 | HR 87 | Ht 67.0 in | Wt 196.0 lb

## 2023-07-17 DIAGNOSIS — Z4502 Encounter for adjustment and management of automatic implantable cardiac defibrillator: Secondary | ICD-10-CM | POA: Insufficient documentation

## 2023-07-17 DIAGNOSIS — I480 Paroxysmal atrial fibrillation: Secondary | ICD-10-CM | POA: Insufficient documentation

## 2023-07-17 DIAGNOSIS — Z7901 Long term (current) use of anticoagulants: Secondary | ICD-10-CM | POA: Insufficient documentation

## 2023-07-17 DIAGNOSIS — Z7984 Long term (current) use of oral hypoglycemic drugs: Secondary | ICD-10-CM | POA: Diagnosis not present

## 2023-07-17 DIAGNOSIS — I428 Other cardiomyopathies: Secondary | ICD-10-CM | POA: Diagnosis not present

## 2023-07-17 DIAGNOSIS — I5022 Chronic systolic (congestive) heart failure: Secondary | ICD-10-CM | POA: Diagnosis not present

## 2023-07-17 DIAGNOSIS — Z95810 Presence of automatic (implantable) cardiac defibrillator: Secondary | ICD-10-CM | POA: Diagnosis not present

## 2023-07-17 DIAGNOSIS — Z79899 Other long term (current) drug therapy: Secondary | ICD-10-CM | POA: Diagnosis not present

## 2023-07-17 DIAGNOSIS — I2510 Atherosclerotic heart disease of native coronary artery without angina pectoris: Secondary | ICD-10-CM | POA: Insufficient documentation

## 2023-07-17 DIAGNOSIS — E1142 Type 2 diabetes mellitus with diabetic polyneuropathy: Secondary | ICD-10-CM | POA: Diagnosis not present

## 2023-07-17 DIAGNOSIS — Z794 Long term (current) use of insulin: Secondary | ICD-10-CM | POA: Diagnosis not present

## 2023-07-17 DIAGNOSIS — I447 Left bundle-branch block, unspecified: Secondary | ICD-10-CM | POA: Diagnosis not present

## 2023-07-17 DIAGNOSIS — I110 Hypertensive heart disease with heart failure: Secondary | ICD-10-CM | POA: Diagnosis not present

## 2023-07-17 DIAGNOSIS — E785 Hyperlipidemia, unspecified: Secondary | ICD-10-CM | POA: Insufficient documentation

## 2023-07-17 DIAGNOSIS — M150 Primary generalized (osteo)arthritis: Secondary | ICD-10-CM | POA: Diagnosis not present

## 2023-07-17 LAB — ECHOCARDIOGRAM COMPLETE: S' Lateral: 4.6 cm

## 2023-07-17 MED ORDER — ATORVASTATIN CALCIUM 80 MG PO TABS: 80.0000 mg | ORAL_TABLET | Freq: Every day | ORAL | 3 refills | Status: DC

## 2023-07-17 NOTE — Addendum Note (Signed)
 Encounter addended by: Perlita Forbush B, RN on: 07/17/2023 9:36 AM  Actions taken: Child order released for a procedure order

## 2023-07-17 NOTE — Patient Instructions (Signed)
 Great to see you today!!!  Medication Changes:  INCREASE Atorvastatin  to 80 mg Daily  Lab Work:  Labs done today, your results will be available in MyChart, we will contact you for abnormal readings.  Your physician recommends that you return for a FASTING lipid profile: 3 months   Special Instructions // Education:  Do the following things EVERYDAY: Weigh yourself in the morning before breakfast. Write it down and keep it in a log. Take your medicines as prescribed Eat low salt foods--Limit salt (sodium) to 2000 mg per day.  Stay as active as you can everyday Limit all fluids for the day to less than 2 liters   Follow-Up in: 3 months with Dr Mitzie Anda   At the [facility], you and your health needs are our priority. We have a designated team specialized in the treatment of Heart Failure. This Care Team includes your primary Heart Failure Specialized Cardiologist (physician), Advanced Practice Providers (APPs- Physician Assistants and Nurse Practitioners), and Pharmacist who all work together to provide you with the care you need, when you need it.   You may see any of the following providers on your designated Care Team at your next follow up:  Dr. Jules Oar Dr. Peder Bourdon Dr. Alwin Baars Dr. Judyth Nunnery Nieves Bars, NP Ruddy Corral, [place] [facility] [place], [place] Dennise Fitz, NP [place] Lee, NP Luster Salters, PharmD   Please be sure to bring in all your medications bottles to every appointment.   Need to Contact Us :  If you have any questions or concerns before your next appointment please send us  a message through [place] or call our office at [phone].    TO LEAVE A MESSAGE FOR THE NURSE SELECT OPTION 2, PLEASE LEAVE A MESSAGE INCLUDING: YOUR NAME DATE OF BIRTH CALL BACK NUMBER REASON FOR CALL**this is important as we prioritize the call backs  YOU WILL RECEIVE A CALL BACK THE SAME DAY AS LONG AS YOU CALL BEFORE 4:00  PM

## 2023-07-17 NOTE — Addendum Note (Signed)
 Encounter addended by: Glorietta Lark, RN on: 07/17/2023 9:36 AM  Actions taken: Order list changed, Diagnosis association updated

## 2023-07-17 NOTE — Progress Notes (Signed)
  Echocardiogram 2D Echocardiogram has been performed.  Farley Honer, RDCS 07/17/2023, 8:23 AM

## 2023-07-20 ENCOUNTER — Ambulatory Visit (HOSPITAL_COMMUNITY): Payer: Self-pay | Admitting: Family Medicine

## 2023-07-20 NOTE — Telephone Encounter (Signed)
 Pt aware, agreeable, and verbalized understanding

## 2023-08-03 ENCOUNTER — Encounter: Payer: Self-pay | Admitting: Internal Medicine

## 2023-08-04 DIAGNOSIS — M25562 Pain in left knee: Secondary | ICD-10-CM | POA: Diagnosis not present

## 2023-08-04 DIAGNOSIS — G894 Chronic pain syndrome: Secondary | ICD-10-CM | POA: Diagnosis not present

## 2023-08-04 DIAGNOSIS — Z79891 Long term (current) use of opiate analgesic: Secondary | ICD-10-CM | POA: Diagnosis not present

## 2023-08-10 ENCOUNTER — Other Ambulatory Visit: Payer: Self-pay | Admitting: Pharmacy Technician

## 2023-08-10 ENCOUNTER — Other Ambulatory Visit: Payer: Self-pay

## 2023-08-10 NOTE — Progress Notes (Signed)
 Specialty Pharmacy Refill Coordination Note  [name] is a 73 y.o. female contacted today regarding refills of specialty medication(s) Tafamidis  (Vyndamax )   Patient requested Delivery   Delivery date: 08/14/23   Verified address: [address]  [place]   Medication will be filled on 08/13/23.

## 2023-08-12 ENCOUNTER — Other Ambulatory Visit: Payer: Self-pay

## 2023-08-24 NOTE — Progress Notes (Signed)
 Remote ICD transmission.

## 2023-09-01 DIAGNOSIS — G894 Chronic pain syndrome: Secondary | ICD-10-CM | POA: Diagnosis not present

## 2023-09-07 ENCOUNTER — Encounter (HOSPITAL_COMMUNITY): Payer: Self-pay

## 2023-09-07 ENCOUNTER — Other Ambulatory Visit: Payer: Self-pay

## 2023-09-07 ENCOUNTER — Emergency Department (HOSPITAL_COMMUNITY)

## 2023-09-07 ENCOUNTER — Emergency Department (HOSPITAL_COMMUNITY)
Admission: EM | Admit: 2023-09-07 | Discharge: 2023-09-07 | Disposition: A | Discharge status: Home / Self Care | Attending: Emergency Medicine | Admitting: Emergency Medicine

## 2023-09-07 DIAGNOSIS — Z95810 Presence of automatic (implantable) cardiac defibrillator: Secondary | ICD-10-CM | POA: Diagnosis not present

## 2023-09-07 DIAGNOSIS — F17200 Nicotine dependence, unspecified, uncomplicated: Secondary | ICD-10-CM | POA: Diagnosis not present

## 2023-09-07 DIAGNOSIS — E119 Type 2 diabetes mellitus without complications: Secondary | ICD-10-CM | POA: Insufficient documentation

## 2023-09-07 DIAGNOSIS — I2510 Atherosclerotic heart disease of native coronary artery without angina pectoris: Secondary | ICD-10-CM | POA: Insufficient documentation

## 2023-09-07 DIAGNOSIS — Z96653 Presence of artificial knee joint, bilateral: Secondary | ICD-10-CM | POA: Insufficient documentation

## 2023-09-07 DIAGNOSIS — R600 Localized edema: Secondary | ICD-10-CM | POA: Diagnosis not present

## 2023-09-07 DIAGNOSIS — I110 Hypertensive heart disease with heart failure: Secondary | ICD-10-CM | POA: Diagnosis not present

## 2023-09-07 DIAGNOSIS — I5042 Chronic combined systolic (congestive) and diastolic (congestive) heart failure: Secondary | ICD-10-CM | POA: Insufficient documentation

## 2023-09-07 DIAGNOSIS — I517 Cardiomegaly: Secondary | ICD-10-CM | POA: Diagnosis not present

## 2023-09-07 DIAGNOSIS — R609 Edema, unspecified: Secondary | ICD-10-CM | POA: Diagnosis not present

## 2023-09-07 DIAGNOSIS — D649 Anemia, unspecified: Secondary | ICD-10-CM | POA: Insufficient documentation

## 2023-09-07 DIAGNOSIS — R7989 Other specified abnormal findings of blood chemistry: Secondary | ICD-10-CM | POA: Diagnosis not present

## 2023-09-07 DIAGNOSIS — Z96643 Presence of artificial hip joint, bilateral: Secondary | ICD-10-CM | POA: Insufficient documentation

## 2023-09-07 DIAGNOSIS — R0600 Dyspnea, unspecified: Secondary | ICD-10-CM

## 2023-09-07 DIAGNOSIS — R072 Precordial pain: Secondary | ICD-10-CM | POA: Insufficient documentation

## 2023-09-07 DIAGNOSIS — R0602 Shortness of breath: Secondary | ICD-10-CM | POA: Insufficient documentation

## 2023-09-07 LAB — PROTIME-INR
INR: 1.1 (ref 0.8–1.2)
Prothrombin Time: 14.9 s (ref 11.4–15.2)

## 2023-09-07 LAB — CBC
HCT: 26.8 % — ABNORMAL LOW (ref 36.0–46.0)
Hemoglobin: 8.7 g/dL — ABNORMAL LOW (ref 12.0–15.0)
MCH: 30.9 pg (ref 26.0–34.0)
MCHC: 32.5 g/dL (ref 30.0–36.0)
MCV: 95 fL (ref 80.0–100.0)
Platelets: 294 K/uL (ref 150–400)
RBC: 2.82 MIL/uL — ABNORMAL LOW (ref 3.87–5.11)
RDW: 13.2 % (ref 11.5–15.5)
WBC: 8.6 K/uL (ref 4.0–10.5)
nRBC: 0 % (ref 0.0–0.2)

## 2023-09-07 LAB — BASIC METABOLIC PANEL WITH GFR
Anion gap: 8 (ref 5–15)
BUN: 11 mg/dL (ref 8–23)
CO2: 25 mmol/L (ref 22–32)
Calcium: 8.5 mg/dL — ABNORMAL LOW (ref 8.9–10.3)
Chloride: 107 mmol/L (ref 98–111)
Creatinine, Ser: 0.98 mg/dL (ref 0.44–1.00)
GFR, Estimated: >60 mL/min (ref >60)
Glucose, Bld: 71 mg/dL (ref 70–99)
Potassium: 3.9 mmol/L (ref 3.5–5.1)
Sodium: 140 mmol/L (ref 135–145)

## 2023-09-07 LAB — TROPONIN I (HIGH SENSITIVITY)
Troponin I (High Sensitivity): 18 ng/L — ABNORMAL HIGH (ref <18)
Troponin I (High Sensitivity): 16 ng/L (ref <18)

## 2023-09-07 LAB — BRAIN NATRIURETIC PEPTIDE: B Natriuretic Peptide: 740 pg/mL — ABNORMAL HIGH (ref 0.0–100.0)

## 2023-09-07 MED ORDER — TORSEMIDE 100 MG PO TABS: 100.0000 mg | ORAL_TABLET | ORAL | 0 refills | Status: DC

## 2023-09-07 MED ORDER — TORSEMIDE 20 MG PO TABS
100.0000 mg | ORAL_TABLET | Freq: Once | ORAL | Status: AC
  Administered 2023-09-07 (×2): 100 mg via ORAL
  Filled 2023-09-07: qty 5

## 2023-09-07 NOTE — Discharge Instructions (Signed)
 You were seen today for shortness of breath. While you were here we monitored your vitals, preformed a physical exam, and labs EKG and chest x-ray. These were all reassuring and there is no indication for any further testing or intervention in the emergency department at this time.   Things to do:  - Follow up with your primary care provider within the next 1-2 weeks - Please take your torsemide  as prescribed and follow-up with your PCP and cardiologist  Return to the emergency department if you have any new or worsening symptoms including worsening shortness of breath, worsening swelling in your legs, chest pain, or if you have any other concerns.

## 2023-09-07 NOTE — ED Provider Notes (Signed)
 [place] EMERGENCY DEPARTMENT AT [facility] Provider Note  MDM   HPI/ROS:  [name] is a 73 y.o. female with a medical history as below who presents with gradual shortness of breath that started yesterday.  Patient reports she ran out of her torsemide  medication and when she requested a refill they gave her the wrong meds.  States she has not had good urine output since taking her medications yesterday.  Has increased orthopnea exertional dyspnea with some substernal nonexertional nonradiating chest pain occasionally over the last few days.  She denies any fevers, chills, cough but does endorse some leg swelling.  Physical exam is notable for: - Well-appearing, saturating well on room air without increased work of breathing.  +1 bilateral lower extremity edema.  No possible Rales  On my initial evaluation, patient is:  -Vital signs stable. Patient afebrile, hemodynamically stable, and non-toxic appearing. -Additional history obtained from chart review  Differentials include ACS, CHF, PE, pneumonia, pneumothorax, hemothorax, dissection.    Physical exam is reassuring as patient has no new O2 requirement, no significant increased work of breathing is not in respiratory distress.  I do believe, given her chronic comorbidities, that labs here are reasonable.  She has new T wave inversion as below.  Will obtain troponins given her nonexertional chest pain.  Home torsemide  was given.  Her workup as below is reassuring.  She continues to saturate well on room air has no difficulty ambulating.  Given this believe she is appropriate for discharge with close follow-up with PCP.  Interpretations, interventions, and the patient's course of care are documented below.    Clinical Course as of 09/07/23 1308  Mon Sep 07, 2023  0756 EKG 12-Lead Rate 90 NSR with occasional PVC, right axis deviation with normal intervals.  Poor R wave progression.New T wave inversion in V6 with nonspecific ST  changes, otherwise unchanged [RC]  0919 Hemoglobin(!): 8.7 Chronic anemia [RC]  0920 DG Chest 2 View No pneumonia, pulmonary edema or widened mediastinum [RC]  1003 BMP without gross electrolyte abnormalities.  Renal function appropriate [RC]  1003 Troponin I (High Sensitivity)(!): 18 [RC]  1004 B Natriuretic Peptide(!): 740.0 Elevated [RC]  1308 Troponin I (High Sensitivity): 16 [RC]    Clinical Course User Index [RC] Sharyne Darina RAMAN, MD      Disposition:  I discussed the plan for discharge with the patient and/or their surrogate at bedside prior to discharge and they were in agreement with the plan and verbalized understanding of the return precautions provided. All questions answered to the best of my ability. Ultimately, the patient was discharged in stable condition with stable vital signs. I am reassured that they are capable of close follow up and good social support at home.   Clinical Impression:  1. Dyspnea, unspecified type      The plan for this patient was discussed with Dr. Bernard, who voiced agreement and who oversaw evaluation and treatment of this patient.   Clinical Complexity A medically appropriate history, review of systems, and physical exam was performed.  My independent interpretations of EKG, labs, and radiology are documented in the ED course above.   If decision rules were used in this patient's evaluation, they are listed below.   Click here for ABCD2, HEART and other calculatorsREFRESH Note before signing   Patient's presentation is most consistent with acute presentation with potential threat to life or bodily function.  Medical Decision Making Amount and/or Complexity of Data Reviewed Labs: ordered. Decision-making details documented  in ED Course. Radiology: ordered. Decision-making details documented in ED Course. ECG/medicine tests:  Decision-making details documented in ED Course.  Risk Prescription drug management.    HPI/ROS       See MDM section for pertinent HPI and ROS. A complete ROS was performed with pertinent positives/negatives noted above.   Past Medical History:  Diagnosis Date   Anemia    a. Noted on 07/2012 labs, instructed to f/u PCP.   Arthritis    joints (11/18/2012)   CAD (coronary artery disease), native coronary artery    a. Nonobstructive by cath 02/2012 (done because of low EF).   Chronic bronchitis (HCC)    ~ every other year (11/18/2012)   Chronic combined systolic and diastolic CHF (congestive heart failure) (HCC)    a. 03/05/12 echo:  LVEF 20-25%, moderate LVH , inferior and basal to mid septal akinesis, anterior moderate to severe hypokinesis and grade 2 diastolic dysfunction. b. EF 07/2012: EF still 25% (unclear medication compliance).   Chronic lower back pain    Headache(784.0)    often; maybe not daily (11/18/2012)   High cholesterol    History of noncompliance with medical treatment    Hypertension    LBBB (left bundle branch block)    Orthopnea    Pneumonia    Tobacco abuse    Type II diabetes mellitus (HCC)     Past Surgical History:  Procedure Laterality Date   BI-VENTRICULAR IMPLANTABLE CARDIOVERTER DEFIBRILLATOR N/A 11/18/2012   Procedure: BI-VENTRICULAR IMPLANTABLE CARDIOVERTER DEFIBRILLATOR  (CRT-D);  Surgeon: Danelle LELON Birmingham, MD;  Location: [facility] CATH LAB;  Service: Cardiovascular;  Laterality: N/A;   BI-VENTRICULAR IMPLANTABLE CARDIOVERTER DEFIBRILLATOR  (CRT-D)  11/18/2012   BIV ICD GENERATOR CHANGEOUT N/A 11/22/2020   Procedure: BIV ICD GENERATOR CHANGEOUT;  Surgeon: Birmingham Danelle LELON, MD;  Location: [facility] INVASIVE CV LAB;  Service: Cardiovascular;  Laterality: N/A;   CARDIAC CATHETERIZATION  03/04/12   nonobstructive CAD, elevated LVEDP and tortuous vessels suggestive of long-standing hypertension   COLONOSCOPY WITH PROPOFOL  N/A 12/12/2016   Procedure: COLONOSCOPY WITH PROPOFOL ;  Surgeon: Rollin Dover, MD;  Location: [facility] ENDOSCOPY;  Service: Endoscopy;  Laterality: N/A;    JOINT REPLACEMENT     Bilateral hip and right knee   LEFT HEART CATH N/A 03/05/2012   Procedure: LEFT HEART CATH;  Surgeon: Ezra GORMAN Shuck, MD;  Location: [facility] CATH LAB;  Service: Cardiovascular;  Laterality: N/A;   TOTAL KNEE ARTHROPLASTY Left 12/22/2022   Procedure: LEFT TOTAL KNEE ARTHROPLASTY;  Surgeon: Liam Lerner, MD;  Location: [facility] ORS;  Service: Orthopedics;  Laterality: Left;      Physical Exam   Vitals:   09/07/23 0754 09/07/23 0754 09/07/23 1004 09/07/23 1110  BP:   (!) 160/80   Pulse: 84  93   Resp: (!) 21  20   Temp:  99 F (37.2 C)  98.1 F (36.7 C)  TempSrc:  Oral    SpO2: 99%  100%   Weight:      Height:        Physical Exam Vitals and nursing note reviewed.  Constitutional:      General: She is not in acute distress.    Appearance: She is well-developed.  HENT:     Head: Normocephalic and atraumatic.  Eyes:     Conjunctiva/sclera: Conjunctivae normal.  Cardiovascular:     Rate and Rhythm: Normal rate and regular rhythm.     Heart sounds: No murmur heard. Pulmonary:     Effort: Pulmonary effort is normal.  No respiratory distress.     Breath sounds: Normal breath sounds. No wheezing, rhonchi or rales.  Abdominal:     Palpations: Abdomen is soft.     Tenderness: There is no abdominal tenderness.  Musculoskeletal:        General: No swelling.     Cervical back: Neck supple.     Right lower leg: 1+ Edema present.     Left lower leg: 1+ Edema present.  Skin:    General: Skin is warm and dry.     Capillary Refill: Capillary refill takes less than 2 seconds.  Neurological:     Mental Status: She is alert.  Psychiatric:        Mood and Affect: Mood normal.      Procedures   If procedures were preformed on this patient, they are listed below:  Procedures   @BBSIG @   Please note that this documentation was produced with the assistance of voice-to-text technology and may contain errors.    Sharyne Darina RAMAN, MD 09/07/23 1311    Bernard Drivers,  MD 09/08/23 1510

## 2023-09-07 NOTE — ED Triage Notes (Signed)
 Pt BIB [facility] from home. SOB x1 day. SHOB worse with exertion and ambulation. Hx CHF and diabetes. Reports swelling in feet. Reports medication changed recently to torsemide  but pt is unsure. Having issues with urinary rentention as well. 100 spO2 on RA.   20g Rt FA.  EMS VSS 157/90, HR 90, CBG 90

## 2023-09-07 NOTE — ED Notes (Signed)
 Pt. returned from XR.

## 2023-09-07 NOTE — ED Notes (Signed)
 Patient transported to X-ray

## 2023-09-10 ENCOUNTER — Other Ambulatory Visit: Payer: Self-pay

## 2023-09-10 ENCOUNTER — Other Ambulatory Visit: Payer: Self-pay | Admitting: Pharmacy Technician

## 2023-09-10 NOTE — Progress Notes (Signed)
 This encounter was created in error - please disregard.

## 2023-09-10 NOTE — Progress Notes (Signed)
 Specialty Pharmacy Refill Coordination Note  [name] is a 73 y.o. female contacted today regarding refills of specialty medication(s) Tafamidis  (Vyndamax )   Patient requested Delivery   Delivery date: 09/15/23   Verified address: [address]   [place] [id number]   Medication will be filled on 09/14/23.

## 2023-09-14 ENCOUNTER — Other Ambulatory Visit: Payer: Self-pay

## 2023-09-16 DIAGNOSIS — J849 Interstitial pulmonary disease, unspecified: Secondary | ICD-10-CM | POA: Diagnosis not present

## 2023-09-16 DIAGNOSIS — J410 Simple chronic bronchitis: Secondary | ICD-10-CM | POA: Diagnosis not present

## 2023-09-16 DIAGNOSIS — R0602 Shortness of breath: Secondary | ICD-10-CM | POA: Diagnosis not present

## 2023-09-16 DIAGNOSIS — Z7901 Long term (current) use of anticoagulants: Secondary | ICD-10-CM | POA: Diagnosis not present

## 2023-09-17 ENCOUNTER — Other Ambulatory Visit (HOSPITAL_COMMUNITY): Payer: Self-pay

## 2023-09-30 ENCOUNTER — Ambulatory Visit (INDEPENDENT_AMBULATORY_CARE_PROVIDER_SITE_OTHER)

## 2023-09-30 DIAGNOSIS — I5022 Chronic systolic (congestive) heart failure: Secondary | ICD-10-CM

## 2023-10-01 ENCOUNTER — Encounter: Payer: Self-pay | Admitting: Pulmonary Disease

## 2023-10-01 LAB — CUP PACEART REMOTE DEVICE CHECK
Battery Remaining Longevity: 51 mo
Battery Voltage: 2.96 V
Brady Statistic AP VP Percent: 0.19 %
Brady Statistic AP VS Percent: 0.01 %
Brady Statistic AS VP Percent: 96.37 %
Brady Statistic AS VS Percent: 3.43 %
Brady Statistic RA Percent Paced: 0.19 %
Brady Statistic RV Percent Paced: 32.4 %
Date Time Interrogation Session: 20250831194612
HighPow Impedance: 67 Ohm
Implantable Lead Connection Status: 753985
Implantable Lead Connection Status: 753985
Implantable Lead Connection Status: 753985
Implantable Lead Implant Date: 20141023
Implantable Lead Implant Date: 20141023
Implantable Lead Implant Date: 20141023
Implantable Lead Location: 753858
Implantable Lead Location: 753859
Implantable Lead Location: 753860
Implantable Lead Model: 4298
Implantable Lead Model: 5076
Implantable Lead Model: 6935
Implantable Pulse Generator Implant Date: 20221027
Lead Channel Impedance Value: 171 Ohm
Lead Channel Impedance Value: 171 Ohm
Lead Channel Impedance Value: 171 Ohm
Lead Channel Impedance Value: 175.622
Lead Channel Impedance Value: 175.622
Lead Channel Impedance Value: 342 Ohm
Lead Channel Impedance Value: 342 Ohm
Lead Channel Impedance Value: 342 Ohm
Lead Channel Impedance Value: 361 Ohm
Lead Channel Impedance Value: 399 Ohm
Lead Channel Impedance Value: 399 Ohm
Lead Channel Impedance Value: 418 Ohm
Lead Channel Impedance Value: 475 Ohm
Lead Channel Impedance Value: 589 Ohm
Lead Channel Impedance Value: 589 Ohm
Lead Channel Impedance Value: 608 Ohm
Lead Channel Impedance Value: 608 Ohm
Lead Channel Impedance Value: 646 Ohm
Lead Channel Pacing Threshold Amplitude: 0.5 V
Lead Channel Pacing Threshold Amplitude: 0.625 V
Lead Channel Pacing Threshold Amplitude: 1.875 V
Lead Channel Pacing Threshold Pulse Width: 0.4 ms
Lead Channel Pacing Threshold Pulse Width: 0.4 ms
Lead Channel Pacing Threshold Pulse Width: 0.4 ms
Lead Channel Sensing Intrinsic Amplitude: 16.125 mV
Lead Channel Sensing Intrinsic Amplitude: 16.125 mV
Lead Channel Sensing Intrinsic Amplitude: 2.125 mV
Lead Channel Sensing Intrinsic Amplitude: 2.125 mV
Lead Channel Setting Pacing Amplitude: 1.5 V
Lead Channel Setting Pacing Amplitude: 2.5 V
Lead Channel Setting Pacing Amplitude: 2.75 V
Lead Channel Setting Pacing Pulse Width: 0.4 ms
Lead Channel Setting Pacing Pulse Width: 0.4 ms
Lead Channel Setting Sensing Sensitivity: 0.45 mV
Zone Setting Status: 755011
Zone Setting Status: 755011

## 2023-10-04 ENCOUNTER — Ambulatory Visit: Payer: Self-pay | Admitting: Internal Medicine

## 2023-10-07 ENCOUNTER — Other Ambulatory Visit: Payer: Self-pay

## 2023-10-07 NOTE — Progress Notes (Signed)
 Specialty Pharmacy Refill Coordination Note  [name] is a 73 y.o. female contacted today regarding refills of specialty medication(s) Tafamidis  (Vyndamax )   Patient requested Delivery   Delivery date: 10/16/23   Verified address: [address]   [place] [id number]   Medication will be filled on 10/15/23.

## 2023-10-08 ENCOUNTER — Telehealth (HOSPITAL_COMMUNITY): Payer: Self-pay | Admitting: Cardiology

## 2023-10-08 NOTE — Telephone Encounter (Signed)
 Called to confirm/remind patient of their appointment at the [facility] on 10/08/23.   Appointment:   [x] Confirmed  [] Left mess   [] No answer/No voice mail  [] VM Full/unable to leave message  [] Phone not in service  Patient reminded to bring all medications and/or complete list.  Confirmed patient has transportation. Gave directions, instructed to utilize valet parking.

## 2023-10-09 ENCOUNTER — Ambulatory Visit (HOSPITAL_COMMUNITY): Payer: Self-pay | Admitting: Cardiology

## 2023-10-09 ENCOUNTER — Other Ambulatory Visit: Payer: Self-pay

## 2023-10-09 ENCOUNTER — Ambulatory Visit (HOSPITAL_COMMUNITY)
Admission: RE | Admit: 2023-10-09 | Discharge: 2023-10-09 | Disposition: A | Discharge status: Home / Self Care | Source: Ambulatory Visit | Attending: Cardiology | Admitting: Cardiology

## 2023-10-09 ENCOUNTER — Other Ambulatory Visit (HOSPITAL_COMMUNITY): Payer: Self-pay

## 2023-10-09 ENCOUNTER — Encounter (HOSPITAL_COMMUNITY): Payer: Self-pay | Admitting: Cardiology

## 2023-10-09 ENCOUNTER — Telehealth: Payer: Self-pay | Admitting: Pharmacist

## 2023-10-09 VITALS — BP 140/70 | HR 88 | Wt 200.0 lb

## 2023-10-09 DIAGNOSIS — Z8616 Personal history of COVID-19: Secondary | ICD-10-CM | POA: Insufficient documentation

## 2023-10-09 DIAGNOSIS — I5022 Chronic systolic (congestive) heart failure: Secondary | ICD-10-CM

## 2023-10-09 DIAGNOSIS — E854 Organ-limited amyloidosis: Secondary | ICD-10-CM | POA: Insufficient documentation

## 2023-10-09 DIAGNOSIS — Z79899 Other long term (current) drug therapy: Secondary | ICD-10-CM | POA: Insufficient documentation

## 2023-10-09 DIAGNOSIS — I480 Paroxysmal atrial fibrillation: Secondary | ICD-10-CM | POA: Diagnosis not present

## 2023-10-09 DIAGNOSIS — I447 Left bundle-branch block, unspecified: Secondary | ICD-10-CM | POA: Insufficient documentation

## 2023-10-09 DIAGNOSIS — Z87891 Personal history of nicotine dependence: Secondary | ICD-10-CM | POA: Insufficient documentation

## 2023-10-09 DIAGNOSIS — E785 Hyperlipidemia, unspecified: Secondary | ICD-10-CM | POA: Diagnosis not present

## 2023-10-09 DIAGNOSIS — Z7901 Long term (current) use of anticoagulants: Secondary | ICD-10-CM | POA: Diagnosis not present

## 2023-10-09 DIAGNOSIS — I2510 Atherosclerotic heart disease of native coronary artery without angina pectoris: Secondary | ICD-10-CM | POA: Insufficient documentation

## 2023-10-09 DIAGNOSIS — I43 Cardiomyopathy in diseases classified elsewhere: Secondary | ICD-10-CM | POA: Diagnosis not present

## 2023-10-09 DIAGNOSIS — I110 Hypertensive heart disease with heart failure: Secondary | ICD-10-CM | POA: Diagnosis not present

## 2023-10-09 LAB — LIPID PANEL
Cholesterol: 162 mg/dL (ref 0–200)
HDL: 51 mg/dL (ref >40)
LDL Cholesterol: 102 mg/dL — ABNORMAL HIGH (ref 0–99)
Total CHOL/HDL Ratio: 3.2 ratio
Triglycerides: 47 mg/dL (ref <150)
VLDL: 9 mg/dL (ref 0–40)

## 2023-10-09 LAB — BASIC METABOLIC PANEL WITH GFR
Anion gap: 9 (ref 5–15)
BUN: 18 mg/dL (ref 8–23)
CO2: 27 mmol/L (ref 22–32)
Calcium: 9.4 mg/dL (ref 8.9–10.3)
Chloride: 103 mmol/L (ref 98–111)
Creatinine, Ser: 1.08 mg/dL — ABNORMAL HIGH (ref 0.44–1.00)
GFR, Estimated: 55 mL/min — ABNORMAL LOW (ref >60)
Glucose, Bld: 104 mg/dL — ABNORMAL HIGH (ref 70–99)
Potassium: 3.2 mmol/L — ABNORMAL LOW (ref 3.5–5.1)
Sodium: 139 mmol/L (ref 135–145)

## 2023-10-09 LAB — PREALBUMIN: Prealbumin: 27 mg/dL (ref 18–38)

## 2023-10-09 LAB — BRAIN NATRIURETIC PEPTIDE: B Natriuretic Peptide: 270.5 pg/mL — ABNORMAL HIGH (ref 0.0–100.0)

## 2023-10-09 MED ORDER — ATTRUBY 356 MG PO TBPK
2.0000 | ORAL_TABLET | Freq: Two times a day (BID) | ORAL | 11 refills | Status: DC
  Filled 2023-10-09: qty 112, 28d supply, fill #0
  Filled 2023-11-02: qty 112, 28d supply, fill #1

## 2023-10-09 MED ORDER — SPIRONOLACTONE 25 MG PO TABS: 25.0000 mg | ORAL_TABLET | Freq: Every day | ORAL | 3 refills | Status: DC

## 2023-10-09 NOTE — Progress Notes (Signed)
 Specialty Pharmacy Initial Fill Coordination Note  [name] is a 73 y.o. female contacted today regarding initial fill of specialty medication(s) Acoramidis  HCl (Attruby )   Patient requested Delivery   Delivery date: 10/12/23   Verified address: [address] [place] [address]   Medication will be filled on 10/12/2023.   Patient is aware of $0 copayment.

## 2023-10-09 NOTE — Progress Notes (Signed)
 Initial fill has been completed in [place].

## 2023-10-09 NOTE — Patient Instructions (Addendum)
 RESTART Spironolactone  25 mg daily.  STOP Tadalafil.  START Attruby  712 mg (2 Tabs) Twice daily  Labs done today, your results will be available in MyChart, we will contact you for abnormal readings.  Repeat blood work in 10 days.  Your physician recommends that you schedule a follow-up appointment in: 3 months.  If you have any questions or concerns before your next appointment please send us  a message through [place] or call our office at [phone].    TO LEAVE A MESSAGE FOR THE NURSE SELECT OPTION 2, PLEASE LEAVE A MESSAGE INCLUDING: YOUR NAME DATE OF BIRTH CALL BACK NUMBER REASON FOR CALL**this is important as we prioritize the call backs  YOU WILL RECEIVE A CALL BACK THE SAME DAY AS LONG AS YOU CALL BEFORE 4:00 PM  At the [facility], you and your health needs are our priority. As part of our continuing mission to provide you with exceptional heart care, we have created designated Provider Care Teams. These Care Teams include your primary Cardiologist (physician) and Advanced Practice Providers (APPs- Physician Assistants and Nurse Practitioners) who all work together to provide you with the care you need, when you need it.   You may see any of the following providers on your designated Care Team at your next follow up: Dr Toribio Fuel Dr Ezra Shuck Dr. Ria Commander Dr. Morene Brownie Amy Lenetta, NP Caffie Shed, [place] [facility] Manning, [place] Beckey Coe, NP [place] Lee, NP Ellouise Class, NP Tinnie Redman, PharmD Jaun Bash, PharmD   Please be sure to bring in all your medications bottles to every appointment.    Thank you for choosing [facility]

## 2023-10-09 NOTE — Telephone Encounter (Signed)
 PA for Attruby  approved through 01/27/24 ([id number])

## 2023-10-09 NOTE — Progress Notes (Signed)
 Specialty Pharmacy Initiation Note   [name] is a 73 y.o. female who will be followed by the specialty pharmacy service for RxSp Cardiology    Review of administration, indication, effectiveness, safety, potential side effects, storage/disposable, and missed dose instructions occurred today for patient's specialty medication(s) Acoramidis  HCl (Attruby )   Patient is aware to stop tafamidis .   Patient/Caregiver did not have any additional questions or concerns.   Patient's therapy is appropriate to: Initiate    Goals Addressed             This Visit's Progress    Avoid hospitalizations and surgery   No change    Patient is on track. Patient will maintain adherence.  Goal is to decrease cardiac related admissions, no recent cardiac admissions.

## 2023-10-10 NOTE — Progress Notes (Signed)
 Patient ID: [name], female   DOB: [birth year], 73 y.o.   MRN: [id number]   [facility] Note   PCP: Sigrid Deidra Fox, MD Cardiology: Dr Rolan  Chief complaint: CHF  [name] is a 73 y.o. female with history of nonischemic cardiomyopathy.  She was admitted with CHF exacerbation in 02/2012.  EF 20-25% on echo, LHC with nonobstructive CAD.  She was started on cardiac meds and discharged.  In 07/2012, she was admitted again with CHF exacerbation.  She had run out of Lasix .  She was taking her other heart medications as ordered, however.  She was diuresed and discharged. She had a chronic LBBB, and Medtronic CRT-D device was placed in 10/14.  She was admitted in 6/16 with hypertensive emergency and CHF exacerbation.   Also of note, she had PFTs in 9/16 showing restrictive spirometry with low lung volume and DLCO.  She was supposed to get a high resolution CT to evaluate for interstitial lung disease but never had the study.     Admitted March 2018 with urosepsis--> E Coli Bacteremia. Completed antibiotic course. EF was down from previous to 30-35%. Also had atrial fibrillation so she was loaded on amiodarone . Placed on eliquis . Discharge weight was 208 pounds.  She is now off amiodarone .   Admitted to [facility] 1/16 -> 02/13/16 with CP and dizziness. Found to be in Afib. Converted spontaneously overnight with BB and diuresis.   Echo 11/19 showed EF 35-40%.  PYP scan in 12/19 suggestive of transthyretin amyloidosis, she was started on tafamidis .    Patient had COVID-19 infection in 10/20.   Echo in 11/20 showed EF 40-45% with mild LVH and mildly decreased RV systolic function.  Echo in 2/22 showed EF up to 55-60%, moderate LVH, normal RV. Echo 5/23 showed EF 45-50%, with severe LVH  Echo in 6/25 showed EF 50-55%, mild LVH, normal RV, normal IVC.   She returns today for followup of CHF.  Says she is taking all her meds.  Takes torsemide  100 mg every other day. This is an  unsual dosing regimen but she has been doing this chronically.  Weight is up about 4 lbs.  She is short of breath with stairs/inclines, does fine on flat ground.  No chest pain.  No lightheadedness or palpitations.  She chronically sleeps on 3-4 pillows.  She is in NSR today.   MDT device interrogation: last significant AF run was in 10/24, 95% BiV pacing, no VT.   ECG (personally reviewed): NSR, aberrant PACs, BiV pacing   Labs (2/24): K 3.5, creatinine 1.08,  BNP 273 Labs (1/25): K 2.8, creatinine 1.29 Labs (3/25): K 3.5, creatinine 1.00, hgb 11, LDL 107  Labs (8/25): BNP 259, K 3.9, creatinine 0.98  PMH: 1. HTN 2. Type II diabetes with neuropathy 3. Nonischemic cardiomyopathy: ? Due to HTN versus LBBB CMP.  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  Echo (2/14) with EF 20-25%.  Echo (7/14) with EF 25%, diffuse hypokinesis.  HIV, SPEP, UPEP negative.  Has LBBB. CRT-D 10/2012 (Medtronic).  Echo (4/15) with EF 45-50%, mild diffuse hypokinesis, PA systolic pressure 38 mmHg.  Echo (6/16) with EF 40-45%, mild LVH, septal and inferior hypokinesis.   - Echo (3/18): EF 30-35%. Grade 1 DD - Echo (6/18): EF 45-50%, moderate LVH, normal RV size with mildly decreased systolic function.  - Echo (11/19): EF 35-40%, moderate LVH, moderate diastolic dysfunction, normal RV size with mildly decreased systolic function, PASP 43 mmHg.  - Echo (11/20):  EF 40-45%, mild LVH, mildly decreased RV systolic function.  - Echo (2/22): EF 55-60%, moderate LVH, normal RV.  - Echo (5/23): EF 45-50%, severe LVH, normal RV - Echo (6/25): EF 50-55%, mild LVH, normal RV, normal IVC.  4. Chronic LBBB 5. Right TKR 6. Bilateral THR.  7. Hyperlipidemia 8. ?COPD: Has oxygen for use with exertion.  - PFTs (9/16) with FEV1 77%, FVC 76%, ratio 101%, TLC 63%, DLCO 41% => moderate restrictive deficit  9. Atrial fibrillation: Paroxysmal.  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  11. COVID-19 infection in 10/20.  12. OSA: CPAP. 13. OA: Knees  SH: Prior smoker, quit 2/14.  Never drank ETOH.  No drugs. Lives with son.   FH: Mother with heart trouble.   Review of systems complete and found to be negative unless listed in HPI.    Current Outpatient Medications  Medication Sig Dispense Refill   atorvastatin  (LIPITOR) 80 MG tablet Take 1 tablet (80 mg total) by mouth daily. 90 tablet 3   carvedilol  (COREG ) 25 MG tablet Take 25 mg by mouth 2 (two) times daily with a meal.     ELIQUIS  5 MG TABS tablet take 1 tablet Oral twice a day for 90 days     ENTRESTO  97-103 MG TAKE 1 TABLET BY MOUTH TWICE DAILY Oral for 30 Days     glucose blood (FREESTYLE TEST STRIPS) test strip Use as instructed 100 each 0   glucose monitoring kit (FREESTYLE) monitoring kit 1 each by Does not apply route 4 (four) times daily - after meals and at bedtime. 1 month Diabetic Testing Supplies for QAC-QHS accuchecks. 1 each 1   HUMALOG KWIKPEN 100 UNIT/ML KwikPen Inject 20 Units into the skin 2 (two) times daily. Sliding scale     insulin  glargine (LANTUS ) 100 UNIT/ML Solostar Pen Inject 50 Units into the skin at bedtime.     Insulin  Pen Needle 32G X 8 MM MISC Use as directed 100 each 0   Lancets (FREESTYLE) lancets Use as instructed 100 each 0   metFORMIN  (GLUCOPHAGE ) 1000 MG tablet 1,000 mg daily with breakfast.     oxyCODONE  (OXY IR/ROXICODONE ) 5 MG immediate release tablet Take 5 mg by mouth every 6 (six) hours as needed.     OZEMPIC, 1 MG/DOSE, 4 MG/3ML SOPN Inject 1 mg into the skin once a week.     torsemide  (DEMADEX ) 100 MG tablet Take 1 tablet (100 mg total) by mouth every other day. 15 tablet 0   Acoramidis , 712MG  Twice Daily, (ATTRUBY ) 356 MG TBPK Take 2 tablets by mouth 2 (two) times daily. 120 each 11   lidocaine  (LIDODERM ) 5 % Place 1 patch onto the skin daily. Remove & Discard patch within 12 hours or as directed by MD (Patient not taking: Reported  on 10/09/2023) 30 patch 0   potassium chloride  (KLOR-CON ) 10 MEQ tablet Oral (Patient not taking: Reported on 10/09/2023)     spironolactone  (ALDACTONE ) 25 MG tablet Take 1 tablet (25 mg total) by mouth daily. 90 tablet 3   No current facility-administered medications for this encounter.   BP (!) 140/70   Pulse 88   Wt 90.7 kg (200 lb)   SpO2 99%   BMI 31.32 kg/m   Wt Readings from Last 3 Encounters:  10/09/23 90.7 kg (200 lb)  09/07/23 89.4 kg (197 lb)  07/17/23 88.9 kg (196 lb)    PHYSICAL EXAM: General:  NAD Neck: No JVD, no thyromegaly or thyroid  nodule.  Lungs: Clear to auscultation bilaterally with normal respiratory effort. CV: Nondisplaced PMI.  Heart regular S1/S2, no S3/S4, no murmur.  No peripheral edema.  No carotid bruit.  Normal pedal pulses.  Abdomen: Soft, nontender, no hepatosplenomegaly, no distention.  Skin: Intact without lesions or rashes.  Neurologic: Alert and oriented x 3.  Psych: Normal affect. Extremities: No clubbing or cyanosis.  HEENT: Normal.   Assessment/Plan: 1. Chronic systolic CHF/Cardiac Amyloidosis: Nonischemic cardiomyopathy, thought to be related to HTN. S/P CRT-D (Medtronic). Echo (2/22) with EF improved up to 55-60%. Echo 5/23 showed EF 45-50%, severe LVH. PYP scan strongly suggestive of transthyretin cardiac amyloidosis. Genetic testing was negative (wild type), now on tafamidis . NYHA class II symptoms.  She is not volume overloaded on exam.  - With ongoing CHF symptoms with flares, will transition from tafamidis  to acoramidis .  Check prealbumin today and again in 6 wks on acoramidis .  - Continue torsemide  100 mg every other day.  This is an unusual regimen but this has kept her stable for the last few months. BMET/BNP today.  - Continue Coreg  25 mg bid   - Continue Bidil  2 tabs tid  - She has been on spironolactone  in the past, unsure why she is off it.  Restart spironolactone  25 mg daily with BMET in 10 days.  2. Hyperlipidemia: On  atorvastastin 80 mg daily.  - Check lipids today.  3. HTN: BP mildly elevated.  - Restart spironolactone .  4. Atrial fibrillation: NSR today.  No recent AF on device interrogation.    - Continue Eliquis  5 mg bid.   Followup in 3 months with APP  I spent 31 minutes reviewing data, interviewing patient and family, and organizing the orders/followup.   Ezra Shuck, MD  10/10/2023

## 2023-10-10 NOTE — Progress Notes (Signed)
Remote ICD Transmission.

## 2023-10-12 ENCOUNTER — Other Ambulatory Visit: Payer: Self-pay

## 2023-10-15 ENCOUNTER — Telehealth (HOSPITAL_COMMUNITY): Payer: Self-pay | Admitting: Family Medicine

## 2023-10-19 ENCOUNTER — Ambulatory Visit (HOSPITAL_COMMUNITY)
Admission: RE | Admit: 2023-10-19 | Discharge: 2023-10-19 | Disposition: A | Discharge status: Home / Self Care | Source: Ambulatory Visit | Attending: Cardiology | Admitting: Cardiology

## 2023-10-19 DIAGNOSIS — I5022 Chronic systolic (congestive) heart failure: Secondary | ICD-10-CM | POA: Insufficient documentation

## 2023-10-19 LAB — BASIC METABOLIC PANEL WITH GFR
Anion gap: 13 (ref 5–15)
BUN: 29 mg/dL — ABNORMAL HIGH (ref 8–23)
CO2: 26 mmol/L (ref 22–32)
Calcium: 9.4 mg/dL (ref 8.9–10.3)
Chloride: 99 mmol/L (ref 98–111)
Creatinine, Ser: 2.2 mg/dL — ABNORMAL HIGH (ref 0.44–1.00)
GFR, Estimated: 24 mL/min — ABNORMAL LOW (ref >60)
Glucose, Bld: 107 mg/dL — ABNORMAL HIGH (ref 70–99)
Potassium: 3.9 mmol/L (ref 3.5–5.1)
Sodium: 138 mmol/L (ref 135–145)

## 2023-10-26 ENCOUNTER — Ambulatory Visit (HOSPITAL_COMMUNITY)
Admission: RE | Admit: 2023-10-26 | Discharge: 2023-10-26 | Disposition: A | Discharge status: Home / Self Care | Source: Ambulatory Visit | Attending: Cardiology | Admitting: Cardiology

## 2023-10-26 ENCOUNTER — Ambulatory Visit (HOSPITAL_COMMUNITY): Payer: Self-pay | Admitting: Cardiology

## 2023-10-26 DIAGNOSIS — I5022 Chronic systolic (congestive) heart failure: Secondary | ICD-10-CM | POA: Insufficient documentation

## 2023-10-26 LAB — BASIC METABOLIC PANEL WITH GFR
Anion gap: 9 (ref 5–15)
BUN: 23 mg/dL (ref 8–23)
CO2: 23 mmol/L (ref 22–32)
Calcium: 9.2 mg/dL (ref 8.9–10.3)
Chloride: 105 mmol/L (ref 98–111)
Creatinine, Ser: 1.39 mg/dL — ABNORMAL HIGH (ref 0.44–1.00)
GFR, Estimated: 41 mL/min — ABNORMAL LOW (ref >60)
Glucose, Bld: 97 mg/dL (ref 70–99)
Potassium: 4.2 mmol/L (ref 3.5–5.1)
Sodium: 137 mmol/L (ref 135–145)

## 2023-11-02 ENCOUNTER — Other Ambulatory Visit: Payer: Self-pay

## 2023-11-02 NOTE — Progress Notes (Signed)
 Specialty Pharmacy Refill Coordination Note  [name] is a 73 y.o. female contacted today regarding refills of specialty medication(s) Acoramidis  HCl (Attruby )   Patient requested Delivery   Delivery date: 11/09/23   Verified address: [address] [place] [address]   Medication will be filled on 11/06/23.

## 2023-11-03 ENCOUNTER — Other Ambulatory Visit: Payer: Self-pay

## 2023-11-05 ENCOUNTER — Other Ambulatory Visit: Payer: Self-pay

## 2023-11-12 ENCOUNTER — Other Ambulatory Visit (HOSPITAL_BASED_OUTPATIENT_CLINIC_OR_DEPARTMENT_OTHER): Payer: Self-pay

## 2023-11-12 ENCOUNTER — Other Ambulatory Visit: Payer: Self-pay

## 2023-11-12 ENCOUNTER — Telehealth (HOSPITAL_COMMUNITY): Payer: Self-pay | Admitting: Pharmacist

## 2023-11-12 MED ORDER — ACORAMIDIS (712MG TWICE DAILY) 356 MG PO TBPK
2.0000 | ORAL_TABLET | Freq: Two times a day (BID) | ORAL | 11 refills | Status: DC
  Filled 2023-11-12: qty 140, 35d supply, fill #0
  Filled 2023-11-27: qty 112, 28d supply, fill #0
  Filled 2023-12-28: qty 112, 28d supply, fill #1
  Filled 2024-01-19: qty 112, 28d supply, fill #2

## 2023-11-12 NOTE — Telephone Encounter (Signed)
 Attruby  originally send under the incorrect context. Updated given the patient was seen at [facility].

## 2023-11-27 ENCOUNTER — Other Ambulatory Visit: Payer: Self-pay

## 2023-12-01 ENCOUNTER — Other Ambulatory Visit (HOSPITAL_COMMUNITY): Payer: Self-pay

## 2023-12-01 ENCOUNTER — Other Ambulatory Visit: Payer: Self-pay | Admitting: Pharmacy Technician

## 2023-12-01 ENCOUNTER — Other Ambulatory Visit: Payer: Self-pay

## 2023-12-01 NOTE — Progress Notes (Signed)
 Specialty Pharmacy Refill Coordination Note  [name] is a 73 y.o. female contacted today regarding refills of specialty medication(s) Acoramidis  HCl   Patient requested Delivery   Delivery date: 12/08/23   Verified address: [address] [name]   Medication will be filled on: 12/07/23

## 2023-12-07 ENCOUNTER — Other Ambulatory Visit: Payer: Self-pay

## 2023-12-28 ENCOUNTER — Other Ambulatory Visit (HOSPITAL_COMMUNITY): Payer: Self-pay

## 2023-12-30 ENCOUNTER — Other Ambulatory Visit: Payer: Self-pay

## 2023-12-30 ENCOUNTER — Ambulatory Visit

## 2023-12-30 DIAGNOSIS — I5022 Chronic systolic (congestive) heart failure: Secondary | ICD-10-CM

## 2023-12-30 NOTE — Progress Notes (Signed)
 Specialty Pharmacy Refill Coordination Note  [name] is a 73 y.o. female contacted today regarding refills of specialty medication(s) Acoramidis  HCl   Patient requested Delivery   Delivery date: 01/01/24   Verified address: [address] [place]   Medication will be filled on: 12/31/23

## 2023-12-31 ENCOUNTER — Other Ambulatory Visit: Payer: Self-pay

## 2024-01-04 LAB — CUP PACEART REMOTE DEVICE CHECK
Battery Remaining Longevity: 44 mo
Battery Voltage: 2.96 V
Brady Statistic AP VP Percent: 0 %
Brady Statistic AP VS Percent: 0 %
Brady Statistic AS VP Percent: 98.71 %
Brady Statistic AS VS Percent: 1.29 %
Brady Statistic RA Percent Paced: 0 %
Brady Statistic RV Percent Paced: 0 %
Date Time Interrogation Session: 20251205212549
HighPow Impedance: 51 Ohm
Implantable Lead Connection Status: 753985
Implantable Lead Connection Status: 753985
Implantable Lead Connection Status: 753985
Implantable Lead Implant Date: 20141023
Implantable Lead Implant Date: 20141023
Implantable Lead Implant Date: 20141023
Implantable Lead Location: 753858
Implantable Lead Location: 753859
Implantable Lead Location: 753860
Implantable Lead Model: 4298
Implantable Lead Model: 5076
Implantable Lead Model: 6935
Implantable Pulse Generator Implant Date: 20221027
Lead Channel Impedance Value: 152 Ohm
Lead Channel Impedance Value: 152 Ohm
Lead Channel Impedance Value: 152 Ohm
Lead Channel Impedance Value: 160.941
Lead Channel Impedance Value: 160.941
Lead Channel Impedance Value: 304 Ohm
Lead Channel Impedance Value: 304 Ohm
Lead Channel Impedance Value: 304 Ohm
Lead Channel Impedance Value: 342 Ohm
Lead Channel Impedance Value: 361 Ohm
Lead Channel Impedance Value: 361 Ohm
Lead Channel Impedance Value: 418 Ohm
Lead Channel Impedance Value: 456 Ohm
Lead Channel Impedance Value: 532 Ohm
Lead Channel Impedance Value: 532 Ohm
Lead Channel Impedance Value: 551 Ohm
Lead Channel Impedance Value: 551 Ohm
Lead Channel Impedance Value: 589 Ohm
Lead Channel Pacing Threshold Amplitude: 0.5 V
Lead Channel Pacing Threshold Amplitude: 0.5 V
Lead Channel Pacing Threshold Amplitude: 1.75 V
Lead Channel Pacing Threshold Pulse Width: 0.4 ms
Lead Channel Pacing Threshold Pulse Width: 0.4 ms
Lead Channel Pacing Threshold Pulse Width: 0.4 ms
Lead Channel Sensing Intrinsic Amplitude: 2.5 mV
Lead Channel Sensing Intrinsic Amplitude: 2.5 mV
Lead Channel Sensing Intrinsic Amplitude: 9.75 mV
Lead Channel Sensing Intrinsic Amplitude: 9.75 mV
Lead Channel Setting Pacing Amplitude: 1.5 V
Lead Channel Setting Pacing Amplitude: 2.5 V
Lead Channel Setting Pacing Amplitude: 2.75 V
Lead Channel Setting Pacing Pulse Width: 0.4 ms
Lead Channel Setting Pacing Pulse Width: 0.4 ms
Lead Channel Setting Sensing Sensitivity: 0.45 mV
Zone Setting Status: 755011
Zone Setting Status: 755011

## 2024-01-04 NOTE — Progress Notes (Incomplete)
 Patient ID: [name], female   DOB: 04-02-1950, 73 y.o.   MRN: [id number]   Advanced Heart Failure Clinic Note   PCP: Sigrid Deidra Fox, MD Cardiology: Dr Rolan  Chief complaint: CHF  [name] is a 73 y.o. female with history of nonischemic cardiomyopathy.  She was admitted with CHF exacerbation in 02/2012.  EF 20-25% on echo, LHC with nonobstructive CAD.  She was started on cardiac meds and discharged.  In 07/2012, she was admitted again with CHF exacerbation.  She had run out of Lasix .  She was taking her other heart medications as ordered, however.  She was diuresed and discharged. She had a chronic LBBB, and Medtronic CRT-D device was placed in 10/14.  She was admitted in 6/16 with hypertensive emergency and CHF exacerbation.   Also of note, she had PFTs in 9/16 showing restrictive spirometry with low lung volume and DLCO.  She was supposed to get a high resolution CT to evaluate for interstitial lung disease but never had the study.     Admitted March 2018 with urosepsis--> E Coli Bacteremia. Completed antibiotic course. EF was down from previous to 30-35%. Also had atrial fibrillation so she was loaded on amiodarone . Placed on eliquis . Discharge weight was 208 pounds.  She is now off amiodarone .   Admitted to [facility] 1/16 -> 02/13/16 with CP and dizziness. Found to be in Afib. Converted spontaneously overnight with BB and diuresis.   Echo 11/19 showed EF 35-40%.  PYP scan in 12/19 suggestive of transthyretin amyloidosis, she was started on tafamidis .    Patient had COVID-19 infection in 10/20.   Echo in 11/20 showed EF 40-45% with mild LVH and mildly decreased RV systolic function.  Echo in 2/22 showed EF up to 55-60%, moderate LVH, normal RV. Echo 5/23 showed EF 45-50%, with severe LVH  Echo in 6/25 showed EF 50-55%, mild LVH, normal RV, normal IVC.   She returns today for followup of CHF.  Says she is taking all her meds.  Takes torsemide  100 mg every other day. This is an  unsual dosing regimen but she has been doing this chronically.  Weight is up about 4 lbs.  She is short of breath with stairs/inclines, does fine on flat ground.  No chest pain.  No lightheadedness or palpitations.  She chronically sleeps on 3-4 pillows.  She is in NSR today.   MDT device interrogation: last significant AF run was in 10/24, 95% BiV pacing, no VT.   ECG (personally reviewed): NSR, aberrant PACs, BiV pacing   Labs (2/24): K 3.5, creatinine 1.08,  BNP 273 Labs (1/25): K 2.8, creatinine 1.29 Labs (3/25): K 3.5, creatinine 1.00, hgb 11, LDL 107  Labs (8/25): BNP 259, K 3.9, creatinine 0.98  PMH: 1. HTN 2. Type II diabetes with neuropathy 3. Nonischemic cardiomyopathy: ? Due to HTN versus LBBB CMP.  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  Echo (2/14) with EF 20-25%.  Echo (7/14) with EF 25%, diffuse hypokinesis.  HIV, SPEP, UPEP negative.  Has LBBB. CRT-D 10/2012 (Medtronic).  Echo (4/15) with EF 45-50%, mild diffuse hypokinesis, PA systolic pressure 38 mmHg.  Echo (6/16) with EF 40-45%, mild LVH, septal and inferior hypokinesis.   - Echo (3/18): EF 30-35%. Grade 1 DD - Echo (6/18): EF 45-50%, moderate LVH, normal RV size with mildly decreased systolic function.  - Echo (11/19): EF 35-40%, moderate LVH, moderate diastolic dysfunction, normal RV size with mildly decreased systolic function, PASP 43 mmHg.  - Echo (11/20):  EF 40-45%, mild LVH, mildly decreased RV systolic function.  - Echo (2/22): EF 55-60%, moderate LVH, normal RV.  - Echo (5/23): EF 45-50%, severe LVH, normal RV - Echo (6/25): EF 50-55%, mild LVH, normal RV, normal IVC.  4. Chronic LBBB 5. Right TKR 6. Bilateral THR.  7. Hyperlipidemia 8. ?COPD: Has oxygen for use with exertion.  - PFTs (9/16) with FEV1 77%, FVC 76%, ratio 101%, TLC 63%, DLCO 41% => moderate restrictive deficit  9. Atrial fibrillation: Paroxysmal.  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  11. COVID-19 infection in 10/20.  12. OSA: CPAP. 13. OA: Knees  SH: Prior smoker, quit 2/14.  Never drank ETOH.  No drugs. Lives with son.   FH: Mother with heart trouble.   Review of systems complete and found to be negative unless listed in HPI.    Current Outpatient Medications  Medication Sig Dispense Refill   Acoramidis , 712MG  Twice Daily, 356 MG TBPK Take 2 tablets by mouth 2 (two) times daily. 120 each 11   atorvastatin  (LIPITOR) 80 MG tablet Take 1 tablet (80 mg total) by mouth daily. 90 tablet 3   carvedilol  (COREG ) 25 MG tablet Take 25 mg by mouth 2 (two) times daily with a meal.     ELIQUIS  5 MG TABS tablet take 1 tablet Oral twice a day for 90 days     ENTRESTO  97-103 MG TAKE 1 TABLET BY MOUTH TWICE DAILY Oral for 30 Days     glucose blood (FREESTYLE TEST STRIPS) test strip Use as instructed 100 each 0   glucose monitoring kit (FREESTYLE) monitoring kit 1 each by Does not apply route 4 (four) times daily - after meals and at bedtime. 1 month Diabetic Testing Supplies for QAC-QHS accuchecks. 1 each 1   HUMALOG KWIKPEN 100 UNIT/ML KwikPen Inject 20 Units into the skin 2 (two) times daily. Sliding scale     insulin  glargine (LANTUS ) 100 UNIT/ML Solostar Pen Inject 50 Units into the skin at bedtime.     Insulin  Pen Needle 32G X 8 MM MISC Use as directed 100 each 0   Lancets (FREESTYLE) lancets Use as instructed 100 each 0   lidocaine  (LIDODERM ) 5 % Place 1 patch onto the skin daily. Remove & Discard patch within 12 hours or as directed by MD (Patient not taking: Reported on 10/09/2023) 30 patch 0   metFORMIN  (GLUCOPHAGE ) 1000 MG tablet 1,000 mg daily with breakfast.     oxyCODONE  (OXY IR/ROXICODONE ) 5 MG immediate release tablet Take 5 mg by mouth every 6 (six) hours as needed.     OZEMPIC, 1 MG/DOSE, 4 MG/3ML SOPN Inject 1 mg into the skin once a week.     potassium chloride  (KLOR-CON ) 10 MEQ tablet Oral (Patient not taking: Reported on  10/09/2023)     torsemide  (DEMADEX ) 100 MG tablet Take 1 tablet (100 mg total) by mouth every other day. 15 tablet 0   No current facility-administered medications for this visit.   There were no vitals taken for this visit.  Wt Readings from Last 3 Encounters:  10/09/23 90.7 kg (200 lb)  09/07/23 89.4 kg (197 lb)  07/17/23 88.9 kg (196 lb)    PHYSICAL EXAM: General: NAD Neck: No JVD, no thyromegaly or thyroid  nodule.  Lungs: Clear to auscultation bilaterally with normal respiratory effort. CV: Nondisplaced PMI.  Heart regular S1/S2, no S3/S4, no murmur.  No peripheral edema.  No  carotid bruit.  Normal pedal pulses.  Abdomen: Soft, nontender, no hepatosplenomegaly, no distention.  Skin: Intact without lesions or rashes.  Neurologic: Alert and oriented x 3.  Psych: Normal affect. Extremities: No clubbing or cyanosis.  HEENT: Normal.   Assessment/Plan: 1. Chronic systolic CHF/Cardiac Amyloidosis: Nonischemic cardiomyopathy, thought to be related to HTN. S/P CRT-D (Medtronic). Echo (2/22) with EF improved up to 55-60%. Echo 5/23 showed EF 45-50%, severe LVH. PYP scan strongly suggestive of transthyretin cardiac amyloidosis. Genetic testing was negative (wild type), now on tafamidis . NYHA class II symptoms.  She is not volume overloaded on exam.  - With ongoing CHF symptoms with flares, will transition from tafamidis  to acoramidis .  Check prealbumin today and again in 6 wks on acoramidis .  - Continue torsemide  100 mg every other day.  This is an unusual regimen but this has kept her stable for the last few months. BMET/BNP today.  - Continue Coreg  25 mg bid   - Continue Bidil  2 tabs tid  - She has been on spironolactone  in the past, unsure why she is off it.  Restart spironolactone  25 mg daily with BMET in 10 days.  2. Hyperlipidemia: On atorvastastin 80 mg daily.  - Check lipids today.  3. HTN: BP mildly elevated.  - Restart spironolactone .  4. Atrial fibrillation: NSR today.  No  recent AF on device interrogation.    - Continue Eliquis  5 mg bid.   Followup in 3 months with APP  I spent 31 minutes reviewing data, interviewing patient and family, and organizing the orders/followup.   Harlene HERO Twodot, FNP  01/04/2024

## 2024-01-05 NOTE — Progress Notes (Signed)
 Remote ICD Transmission

## 2024-01-06 ENCOUNTER — Ambulatory Visit: Payer: Self-pay | Admitting: Internal Medicine

## 2024-01-07 ENCOUNTER — Telehealth (HOSPITAL_COMMUNITY): Payer: Self-pay

## 2024-01-07 NOTE — Telephone Encounter (Signed)
 Called to confirm/remind patient of their appointment at the Advanced Heart Failure Clinic on 01/08/24.   Appointment:   [x] Confirmed  [] Left mess   [] No answer/No voice mail  [] VM Full/unable to leave message  [] Phone not in service  Patient reminded to bring all medications and/or complete list.  Confirmed patient has transportation. Gave directions, instructed to utilize valet parking.

## 2024-01-08 ENCOUNTER — Ambulatory Visit (HOSPITAL_COMMUNITY)
Admission: RE | Admit: 2024-01-08 | Discharge: 2024-01-08 | Disposition: A | Source: Ambulatory Visit | Attending: Family Medicine

## 2024-01-08 ENCOUNTER — Ambulatory Visit (HOSPITAL_COMMUNITY): Payer: Self-pay | Admitting: Family Medicine

## 2024-01-08 ENCOUNTER — Encounter (HOSPITAL_COMMUNITY): Payer: Self-pay

## 2024-01-08 VITALS — BP 110/60 | HR 97 | Ht 67.0 in | Wt 207.2 lb

## 2024-01-08 DIAGNOSIS — E785 Hyperlipidemia, unspecified: Secondary | ICD-10-CM | POA: Diagnosis not present

## 2024-01-08 DIAGNOSIS — I480 Paroxysmal atrial fibrillation: Secondary | ICD-10-CM

## 2024-01-08 DIAGNOSIS — I5022 Chronic systolic (congestive) heart failure: Secondary | ICD-10-CM

## 2024-01-08 DIAGNOSIS — I10 Essential (primary) hypertension: Secondary | ICD-10-CM | POA: Diagnosis not present

## 2024-01-08 LAB — BASIC METABOLIC PANEL WITH GFR
Anion gap: 10 (ref 5–15)
BUN: 28 mg/dL — ABNORMAL HIGH (ref 8–23)
CO2: 31 mmol/L (ref 22–32)
Calcium: 8.7 mg/dL — ABNORMAL LOW (ref 8.9–10.3)
Chloride: 98 mmol/L (ref 98–111)
Creatinine, Ser: 1.7 mg/dL — ABNORMAL HIGH (ref 0.44–1.00)
GFR, Estimated: 32 mL/min — ABNORMAL LOW (ref >60)
Glucose, Bld: 133 mg/dL — ABNORMAL HIGH (ref 70–99)
Potassium: 4 mmol/L (ref 3.5–5.1)
Sodium: 139 mmol/L (ref 135–145)

## 2024-01-08 LAB — LIPID PANEL
Cholesterol: 176 mg/dL (ref 0–200)
HDL: 53 mg/dL (ref >40)
LDL Cholesterol: 109 mg/dL — ABNORMAL HIGH (ref 0–99)
Total CHOL/HDL Ratio: 3.3 ratio
Triglycerides: 69 mg/dL (ref <150)
VLDL: 14 mg/dL (ref 0–40)

## 2024-01-08 LAB — PREALBUMIN: Prealbumin: 31 mg/dL (ref 18–38)

## 2024-01-08 MED ORDER — TORSEMIDE 100 MG PO TABS: 100.0000 mg | ORAL_TABLET | Freq: Every day | ORAL | 3 refills | Status: AC

## 2024-01-08 NOTE — Addendum Note (Signed)
 Encounter addended by: Glena Harlene HERO, FNP on: 01/08/2024 11:38 AM  Actions taken: Clinical Note Signed

## 2024-01-08 NOTE — Patient Instructions (Signed)
 CHANGE Torsemide  to 100 mg daily.  Labs done today, your results will be available in MyChart, we will contact you for abnormal readings.  REPEAT blood work in 7-10 days.  You have been referred to the Lipid Clinic. They will call you to arrange your appointment.  Your physician recommends that you schedule a follow-up appointment in: 4 months ( April 2026) ** PLEASE CALL THE OFFICE IN FEBRUARY TO ARRANGE YOUR FOLLOW UP APPOINTMENT.**  If you have any questions or concerns before your next appointment please send us  a message through [facility] or call our office at [phone].    TO LEAVE A MESSAGE FOR THE NURSE SELECT OPTION 2, PLEASE LEAVE A MESSAGE INCLUDING: YOUR NAME DATE OF BIRTH CALL BACK NUMBER REASON FOR CALL**this is important as we prioritize the call backs  YOU WILL RECEIVE A CALL BACK THE SAME DAY AS LONG AS YOU CALL BEFORE 4:00 PM  At the Advanced Heart Failure Clinic, you and your health needs are our priority. As part of our continuing mission to provide you with exceptional heart care, we have created designated Provider Care Teams. These Care Teams include your primary Cardiologist (physician) and Advanced Practice Providers (APPs- Physician Assistants and Nurse Practitioners) who all work together to provide you with the care you need, when you need it.   You may see any of the following providers on your designated Care Team at your next follow up: Dr Toribio Fuel Dr Ezra Shuck Dr. Morene Brownie Greig Mosses, NP Caffie Shed, [place] [facility] [place], [place] Beckey Coe, NP Jordan Lee, NP Ellouise Class, NP Tinnie Redman, PharmD Jaun Bash, PharmD   Please be sure to bring in all your medications bottles to every appointment.    Thank you for choosing [facility]-Advanced Heart Failure Clinic

## 2024-01-11 ENCOUNTER — Telehealth (HOSPITAL_COMMUNITY): Payer: Self-pay | Admitting: *Deleted

## 2024-01-11 NOTE — Telephone Encounter (Signed)
 Called patient per Harlene Gainer, NP with following labs and instructions:  LDL elevated, we have referred her to Lipid Clinic. Renal function elevated, diuretics adjusted today. She has repeat labs arranged.  Pt confirmed she decreased her torsemide  to 100 mg daily. She does plan on going to Lipid Clinic when they call to scheduled first appointment. She will come on scheduled day for repeat labs.

## 2024-01-11 NOTE — Progress Notes (Signed)
 Request from Advanced HF clinic after 01/08/2024 office visit and Torsemide  increase:  Wedron, Harlene HERO, FNP  Vamsi Apfel, Mitzie RAMAN, RN Hey! Can you send me a transmission in 1 week please? Thanks! Jess  ICM Remote Transmission scheduled for 01/15/2024.

## 2024-01-12 ENCOUNTER — Telehealth: Payer: Self-pay

## 2024-01-12 NOTE — Telephone Encounter (Signed)
 ICM call to patient.  Explained reason for call.  Advised Colleen Gainer, NP at [facility] would like a report from her device to check if she is retaining any fluid.  Pt will send report this evening.  She wanted confirmation on her HF clinic lab appt and app with Colleen Barrack, NP.  Advised will call her back tomorrow with the results of her transmission.

## 2024-01-13 ENCOUNTER — Ambulatory Visit

## 2024-01-13 DIAGNOSIS — I5022 Chronic systolic (congestive) heart failure: Secondary | ICD-10-CM

## 2024-01-13 DIAGNOSIS — Z95810 Presence of automatic (implantable) cardiac defibrillator: Secondary | ICD-10-CM

## 2024-01-13 NOTE — Progress Notes (Signed)
 EPIC Encounter for ICM Monitoring  Patient Name: [name] is a 73 y.o. female Date: 01/13/2024 Primary Care Physican: Sigrid Deidra Fox, MD Primary Cardiologist: Rolan Electrophysiologist: Waddell Pore Pacing: 97.1%  01/08/2024 Office Weight: 207.3 lbs       ICM check as requested by Harlene Gainer, NP at HF clinic.  Spoke with patient on 01/12/2024.  Heart Failure questions reviewed.  Pt reports she is feeling well and has no complaints.  She is aware she has labs scheduled for 01/19/2024 and office visit with Daphne Barrack, NP on 01/22/2024.   Optivol thoracic impedance suggesting normal fluid levels since 01/06/2024.   Torsemide  decreased to 100 mg every day at 01/08/2024 HF clinic office visit.    Prescribed:  Torsemide  100 mg take 1 tablet(s) (100 mg total) by mouth daily. Potassium 10 mEq take 1 tablet(s) (10 mEq total) by mouth daily per 01/08/2024 office visit notes.  Labs: 01/19/2024 BMET scheduled at HF clinic 01/08/2024 Creatinine 1.70, BUN 28, Potassium 4.0, Sodium 139, GFR 32  10/26/2023 Creatinine 1.39, BUN 23, Potassium 4.2, Sodium 137, GFR 41  10/19/2023 Creatinine 2.20, BUN 29, Potassium 3.9, Sodium 138, GFR 24  A complete set of results can be found in Results Review.  Recommendations:  Copy sent to Harlene Gainer, NP at [facility] clinic as requested following 01/08/2024 office visit.   Follow-up plan: No further ICM clinic phone appointments at this time.   91 day device clinic remote transmission 03/30/2024.    EP/Cardiology Office Visits: 01/21/2025 with Daphne Barrack, NP.    Copy of ICM check sent to Dr. Waddell.    Remote monitoring is medically necessary for Heart Failure Management.    Daily Thoracic Impedance ICM trend: 10/14/2023 through 01/13/2024.    12-14 Month Thoracic Impedance ICM trend:     Mitzie GORMAN Garner, RN 01/13/2024 7:32 AM

## 2024-01-15 ENCOUNTER — Ambulatory Visit

## 2024-01-19 ENCOUNTER — Ambulatory Visit (HOSPITAL_COMMUNITY)
Admission: RE | Admit: 2024-01-19 | Discharge: 2024-01-19 | Disposition: A | Discharge status: Home / Self Care | Source: Ambulatory Visit | Attending: Internal Medicine | Admitting: Internal Medicine

## 2024-01-19 ENCOUNTER — Other Ambulatory Visit (HOSPITAL_COMMUNITY): Payer: Self-pay

## 2024-01-19 ENCOUNTER — Telehealth (HOSPITAL_COMMUNITY): Payer: Self-pay

## 2024-01-19 ENCOUNTER — Other Ambulatory Visit: Payer: Self-pay

## 2024-01-19 DIAGNOSIS — I5022 Chronic systolic (congestive) heart failure: Secondary | ICD-10-CM | POA: Diagnosis present

## 2024-01-19 LAB — BASIC METABOLIC PANEL WITH GFR
Anion gap: 10 (ref 5–15)
BUN: 33 mg/dL — ABNORMAL HIGH (ref 8–23)
CO2: 30 mmol/L (ref 22–32)
Calcium: 9.3 mg/dL (ref 8.9–10.3)
Chloride: 102 mmol/L (ref 98–111)
Creatinine, Ser: 1.48 mg/dL — ABNORMAL HIGH (ref 0.44–1.00)
GFR, Estimated: 37 mL/min — ABNORMAL LOW
Glucose, Bld: 109 mg/dL — ABNORMAL HIGH (ref 70–99)
Potassium: 4 mmol/L (ref 3.5–5.1)
Sodium: 142 mmol/L (ref 135–145)

## 2024-01-19 NOTE — Addendum Note (Signed)
 Encounter addended by: Dante Jeannine HERO, CMA on: 01/19/2024 9:13 AM  Actions taken: Charge Capture section accepted

## 2024-01-19 NOTE — Progress Notes (Signed)
 " Electrophysiology Office Note:   Date:  01/22/2024  ID:  [name], DOB 01-30-1950, MRN [id number]  Primary Cardiologist: Ezra Shuck, MD Primary Heart Failure: None Electrophysiologist: Fonda Kitty, MD       History of Present Illness:   [name] is a 73 y.o. female with h/o HFrEF / NICM s/p BiV ICD, non-obs CAD, LBBB, HTN, cardiac amyloidosis, restrictive lung disease, DM II, OSA on CPAP seen today for routine electrophysiology followup.   Recent HF monitoring review noted normal fluid levels since 01/06/24 and torsemide  was decreased to 100 mg daily 01/08/24 at HF Clinic visit.   Since last being seen in our clinic the patient reports doing fairly well. She was happy to celebrate Christmas with her grandchildren. No device related concerns.  Notes she has a hard time swallowing her metformin  pills.   She denies chest pain, palpitations, dyspnea, PND, orthopnea, nausea, vomiting, dizziness, syncope, edema, weight gain, or early satiety.   Review of systems complete and found to be negative unless listed in HPI.   EP Information / Studies Reviewed:    EKG is not ordered today. EKG from 10/09/23 reviewed which showed VP 83 bpm with PVC      ICD Interrogation-  reviewed in detail today,  See PACEART report.  Device History: Medtronic BiV ICD implanted 11/18/12 for HFrEF, LBBB History of appropriate therapy: No History of AAD therapy: No   Risk Assessment/Calculations:    CHA2DS2-VASc Score = 6   This indicates a 9.7% annual risk of stroke. The patient's score is based upon: CHF History: 1 HTN History: 1 Diabetes History: 1 Stroke History: 0 Vascular Disease History: 1 Age Score: 1 Gender Score: 1             Physical Exam:   VS:  BP 134/70 (BP Location: Left Arm, Patient Position: Sitting, Cuff Size: Large)   Pulse 83   Ht 5' 7 (1.702 m)   Wt 215 lb 9.6 oz (97.8 kg)   SpO2 96%   BMI 33.77 kg/m    Wt Readings from Last 3 Encounters:  01/22/24  215 lb 9.6 oz (97.8 kg)  01/08/24 207 lb 3.2 oz (94 kg)  10/09/23 200 lb (90.7 kg)     GEN: Well nourished, well developed in no acute distress NECK: No JVD; No carotid bruits CARDIAC: Regular rate and rhythm, no murmurs, rubs, gallops, device site wnl, no tethering RESPIRATORY:  Clear to auscultation without rales, wheezing or rhonchi  ABDOMEN: Soft, non-tender, non-distended EXTREMITIES:  No edema; No deformity   ASSESSMENT AND PLAN:    Chronic Systolic Dysfunction, NICM, LBBB s/p Medtronic CRT-D  Cardiac Amyloidosis  -mild volume in last few days, expect holiday diet, discussed getting back to regular eating / liquid volume. None on exam.  -Stable on an appropriate medical regimen / follows with Dr. Bensimhon  -Normal ICD function -See Pace Art report -No changes today  Paroxysmal Atrial Fibrillation  CHA2DS2-VASc 6  -OAC for stroke prophylaxis  -<0.1% burden AF on device  -coreg  25 mg BID   Secondary Hypercoagulable State  -continue Eliquis  5mg  BID, dose reviewed and appropriate by age / wt   Hypertension  -well controlled on current regimen   OSA  -per primary    Will review metformin  issues with Pharmacy.     Disposition:   Follow up with EP APP or Dr. Kitty in 12 months > transition to new EP MD reviewed with patient    Signed, Daphne Barrack, NP-C,  AGACNP-BC [facility] - Electrophysiology  01/22/2024, 9:30 AM  "

## 2024-01-19 NOTE — Progress Notes (Signed)
 Specialty Pharmacy Refill Coordination Note  [name] is a 73 y.o. female contacted today regarding refills of specialty medication(s) Acoramidis  HCl   Patient requested Delivery   Delivery date: 01/25/24   Verified address: [address] [place]   Medication will be filled on: 01/22/24

## 2024-01-19 NOTE — Telephone Encounter (Signed)
 Advanced Heart Failure Patient Advocate Encounter  Prior authorization for Vyndamax  has been submitted and approved. Test billing returns $0 for 30 day supply.  KeyBETHA CROUCH Effective: 01/19/2024 to 01/26/2025  Rachel DEL, CPhT Rx Patient Advocate Phone: [phone]

## 2024-01-20 ENCOUNTER — Other Ambulatory Visit: Payer: Self-pay

## 2024-01-20 ENCOUNTER — Other Ambulatory Visit (HOSPITAL_COMMUNITY): Payer: Self-pay

## 2024-01-20 ENCOUNTER — Ambulatory Visit: Admitting: Pharmacist

## 2024-01-20 DIAGNOSIS — Z79899 Other long term (current) drug therapy: Secondary | ICD-10-CM | POA: Diagnosis not present

## 2024-01-20 DIAGNOSIS — E785 Hyperlipidemia, unspecified: Secondary | ICD-10-CM | POA: Insufficient documentation

## 2024-01-20 MED ORDER — ROSUVASTATIN CALCIUM 20 MG PO TABS
20.0000 mg | ORAL_TABLET | Freq: Every day | ORAL | 3 refills | Status: AC
  Filled 2024-01-20: qty 90, 90d supply, fill #0

## 2024-01-20 NOTE — Patient Instructions (Addendum)
 Start taking rosuvastatin  20mg  daily. You can pick this up from the pharmacy downstairs on your way out Recheck labs in 2 months- March 2nf Please call me at [phone] with any issues Bring all your medications with you to your visit with Daphne If there are some that you have that you can't swallow please let us  know.

## 2024-01-20 NOTE — Assessment & Plan Note (Signed)
 Assessment: She is somewhat confused about her medications.  Has a hard time pronouncing the names.  Wishes she brought them with her.  There are at least 2 medications that she is not taking because she cannot swallow.  Reports they are too big and get stuck.    Plan: Pt has an appointment with Brandi on Friday.  I have asked her to bring all of her medications in to that appointment.  I have asked her to let the CMA and Brandi know which ones she is not taking and to pass that information along to me so that we can figure out a better substitute.

## 2024-01-20 NOTE — Assessment & Plan Note (Signed)
 Assessment: LDL-C is above goal of less than 70 due to diabetes Patient does not believe she has been taking atorvastatin .  States the tablets are too large and get stuck in her throat.  Plan: Change atorvastatin  to rosuvastatin  20 mg daily as these are smaller and round versus large and oblong Recheck labs March 2

## 2024-01-20 NOTE — Progress Notes (Signed)
 Patient ID: [name]                 DOB: December 27, 1950                    MRN: [id number]      HPI: [name] is a 73 y.o. female patient referred to lipid clinic by [facility]. PMH is significant for CHF Echo 5/23 showed EF 45-50%, severe LVH, EF previously 20-25%, non-obstructive CAD, HTN, HLD, afib, ATTR wild type cardiomyopathy, DM.   LDL-C in Dec 109. Previously better controlled many years ago. Patient referred to lipid clinic.  Patient presents today to lipid clinic.  She reports that she has atorvastatin  but it is too big to swallow and she has not been taking.    She is somewhat confused about her medications.  Has a hard time pronouncing the names.  Wishes she brought them with her.  There are at least 2 medications that she is not taking because she cannot swallow.  Reports they are too big and get stuck.  She has an appointment with Daphne on Friday.  I have asked her to bring all of her medications in to that appointment.  I have asked her to let the CMA and Brandi know which ones she is not taking and to pass that information along to me so that we can figure out a better substitute.  Current Medications: atorvastatin  80mg  daily (not taking) Intolerances: Atorvastatin  80-difficulty swallowing tablet Risk Factors: DM, CHF, HTN LDL-C goal: Less than 70  Labs: Lipid Panel     Component Value Date/Time   CHOL 176 01/08/2024 0945   TRIG 69 01/08/2024 0945   HDL 53 01/08/2024 0945   CHOLHDL 3.3 01/08/2024 0945   VLDL 14 01/08/2024 0945   LDLCALC 109 (H) 01/08/2024 0945    Past Medical History:  Diagnosis Date   Anemia    a. Noted on 07/2012 labs, instructed to f/u PCP.   Arthritis    joints (11/18/2012)   CAD (coronary artery disease), native coronary artery    a. Nonobstructive by cath 02/2012 (done because of low EF).   Chronic bronchitis (HCC)    ~ every other year (11/18/2012)   Chronic combined systolic and diastolic CHF (congestive heart failure)  (HCC)    a. 03/05/12 echo:  LVEF 20-25%, moderate LVH , inferior and basal to mid septal akinesis, anterior moderate to severe hypokinesis and grade 2 diastolic dysfunction. b. EF 07/2012: EF still 25% (unclear medication compliance).   Chronic lower back pain    Headache(784.0)    often; maybe not daily (11/18/2012)   High cholesterol    History of noncompliance with medical treatment    Hypertension    LBBB (left bundle branch block)    Orthopnea    Pneumonia    Tobacco abuse    Type II diabetes mellitus (HCC)     Medications Ordered Prior to Encounter[1]  Allergies[2]  Assessment/Plan:  1. Hyperlipidemia -  Hyperlipidemia Assessment: LDL-C is above goal of less than 70 due to diabetes Patient does not believe she has been taking atorvastatin .  States the tablets are too large and get stuck in her throat.  Plan: Change atorvastatin  to rosuvastatin  20 mg daily as these are smaller and round versus large and oblong Recheck labs March 2  Medication management Assessment: She is somewhat confused about her medications.  Has a hard time pronouncing the names.  Wishes she brought them with her.  There are at least 2 medications that she is not taking because she cannot swallow.  Reports they are too big and get stuck.    Plan: Pt has an appointment with Brandi on Friday.  I have asked her to bring all of her medications in to that appointment.  I have asked her to let the CMA and Brandi know which ones she is not taking and to pass that information along to me so that we can figure out a better substitute.    Thank you,  Betania Dizon D Hughes Wyndham, Pharm.JONETTA SARAN, CPP [facility] A Division of [facility] [address] [place], [place] [address]  Phone: [phone]; Fax: [phone]        [1]  Current Outpatient Medications on File Prior to Visit  Medication Sig Dispense Refill   carvedilol  (COREG ) 25 MG tablet Take 25 mg by mouth 2 (two)  times daily with a meal.     Acoramidis , 712MG  Twice Daily, 356 MG TBPK Take 2 tablets by mouth 2 (two) times daily. 120 each 11   ELIQUIS  5 MG TABS tablet take 1 tablet Oral twice a day for 90 days     ENTRESTO  97-103 MG TAKE 1 TABLET BY MOUTH TWICE DAILY Oral for 30 Days (Patient not taking: Reported on 01/08/2024)     FARXIGA  10 MG TABS tablet Take 10 mg by mouth daily.     glucose blood (FREESTYLE TEST STRIPS) test strip Use as instructed 100 each 0   glucose monitoring kit (FREESTYLE) monitoring kit 1 each by Does not apply route 4 (four) times daily - after meals and at bedtime. 1 month Diabetic Testing Supplies for QAC-QHS accuchecks. 1 each 1   HUMALOG KWIKPEN 100 UNIT/ML KwikPen Inject 20 Units into the skin 2 (two) times daily. Sliding scale     insulin  glargine (LANTUS ) 100 UNIT/ML Solostar Pen Inject 50 Units into the skin at bedtime.     Insulin  Pen Needle 32G X 8 MM MISC Use as directed 100 each 0   isosorbide -hydrALAZINE  (BIDIL ) 20-37.5 MG tablet Take 2 tablets by mouth 3 (three) times daily.     Lancets (FREESTYLE) lancets Use as instructed 100 each 0   lidocaine  (LIDODERM ) 5 % Place 1 patch onto the skin daily. Remove & Discard patch within 12 hours or as directed by MD (Patient not taking: Reported on 01/08/2024) 30 patch 0   LINZESS 145 MCG CAPS capsule Take 145 mcg by mouth daily before breakfast.     metFORMIN  (GLUCOPHAGE ) 1000 MG tablet 1,000 mg daily with breakfast.     oxyCODONE  (OXY IR/ROXICODONE ) 5 MG immediate release tablet Take 5 mg by mouth every 6 (six) hours as needed.     OZEMPIC, 1 MG/DOSE, 4 MG/3ML SOPN Inject 1 mg into the skin once a week. (Patient not taking: Reported on 01/08/2024)     potassium chloride  (KLOR-CON ) 10 MEQ tablet Oral (Patient not taking: Reported on 01/08/2024)     torsemide  (DEMADEX ) 100 MG tablet Take 1 tablet (100 mg total) by mouth daily. 90 tablet 3   No current facility-administered medications on file prior to visit.  [2]   Allergies Allergen Reactions   Morphine And Codeine Nausea Only    Severe nausea   Penicillins Other (See Comments)    Pt states she has had a pain in her leg since a penicillin  injection 2 months ago (reported 08/31/17).  Has tolerated amoxicillin  oral.

## 2024-01-22 ENCOUNTER — Other Ambulatory Visit: Payer: Self-pay

## 2024-01-22 ENCOUNTER — Ambulatory Visit: Admitting: Pulmonary Disease

## 2024-01-22 ENCOUNTER — Telehealth: Payer: Self-pay | Admitting: Pharmacist

## 2024-01-22 ENCOUNTER — Encounter: Payer: Self-pay | Admitting: Pulmonary Disease

## 2024-01-22 VITALS — BP 134/70 | HR 83 | Ht 67.0 in | Wt 215.6 lb

## 2024-01-22 DIAGNOSIS — I5022 Chronic systolic (congestive) heart failure: Secondary | ICD-10-CM | POA: Diagnosis not present

## 2024-01-22 DIAGNOSIS — D6869 Other thrombophilia: Secondary | ICD-10-CM

## 2024-01-22 DIAGNOSIS — I480 Paroxysmal atrial fibrillation: Secondary | ICD-10-CM

## 2024-01-22 DIAGNOSIS — E854 Organ-limited amyloidosis: Secondary | ICD-10-CM | POA: Diagnosis not present

## 2024-01-22 DIAGNOSIS — Z95810 Presence of automatic (implantable) cardiac defibrillator: Secondary | ICD-10-CM

## 2024-01-22 DIAGNOSIS — I43 Cardiomyopathy in diseases classified elsewhere: Secondary | ICD-10-CM

## 2024-01-22 LAB — CUP PACEART INCLINIC DEVICE CHECK
Date Time Interrogation Session: 20251226092639
Implantable Lead Connection Status: 753985
Implantable Lead Connection Status: 753985
Implantable Lead Connection Status: 753985
Implantable Lead Implant Date: 20141023
Implantable Lead Implant Date: 20141023
Implantable Lead Implant Date: 20141023
Implantable Lead Location: 753858
Implantable Lead Location: 753859
Implantable Lead Location: 753860
Implantable Lead Model: 4298
Implantable Lead Model: 5076
Implantable Lead Model: 6935
Implantable Pulse Generator Implant Date: 20221027

## 2024-01-22 NOTE — Patient Instructions (Signed)
 Medication Instructions:  No medications changes today   *If you need a refill on your cardiac medications before your next appointment, please call your pharmacy*  Lab Work: No lab work today If you have labs (blood work) drawn today and your tests are completely normal, you will receive your results only by: MyChart Message (if you have MyChart) OR A paper copy in the mail If you have any lab test that is abnormal or we need to change your treatment, we will call you to review the results.  Testing/Procedures: No testing/procedures were scheduled today  Follow-Up: At [facility], you and your health needs are our priority.  As part of our continuing mission to provide you with exceptional heart care, our providers are all part of one team.  This team includes your primary Cardiologist (physician) and Advanced Practice Providers or APPs (Physician Assistants and Nurse Practitioners) who all work together to provide you with the care you need, when you need it.  Your next appointment:   12 month(s)  Provider:   You may see Dr. Kennyth or one of the following Advanced Practice Providers on your designated Care Team:   Suzann Riddle, NP Daphne Barrack, NP    We recommend signing up for the patient portal called MyChart.  Sign up information is provided on this After Visit Summary.  MyChart is used to connect with patients for Virtual Visits (Telemedicine).  Patients are able to view lab/test results, encounter notes, upcoming appointments, etc.  Non-urgent messages can be sent to your provider as well.   To learn more about what you can do with MyChart, go to [email or web address].

## 2024-01-22 NOTE — Telephone Encounter (Signed)
 Patient was seen by Daphne today and reports the medication she has a hard time swallowing is her metformin . Last A1C from Oct was well controlled. I advised that she let her PCP know. She states that rosuvastatin  I gave her a few days ago is ok and she can swallow.

## 2024-01-23 ENCOUNTER — Ambulatory Visit: Payer: Self-pay | Admitting: Cardiology

## 2024-01-29 ENCOUNTER — Encounter: Payer: Self-pay | Admitting: Cardiology

## 2024-02-08 ENCOUNTER — Other Ambulatory Visit (HOSPITAL_COMMUNITY): Payer: Self-pay

## 2024-02-11 ENCOUNTER — Other Ambulatory Visit: Payer: Self-pay

## 2024-02-18 ENCOUNTER — Other Ambulatory Visit: Payer: Self-pay

## 2024-02-18 ENCOUNTER — Other Ambulatory Visit (HOSPITAL_COMMUNITY): Payer: Self-pay | Admitting: Pharmacist

## 2024-02-18 MED ORDER — ACORAMIDIS (712MG TWICE DAILY) 356 MG PO TBPK
2.0000 | ORAL_TABLET | Freq: Two times a day (BID) | ORAL | 11 refills | Status: AC
  Filled 2024-02-18: qty 120, 30d supply, fill #0
  Filled 2024-02-24 – 2024-03-03 (×2): qty 112, 28d supply, fill #0

## 2024-02-19 ENCOUNTER — Other Ambulatory Visit: Payer: Self-pay

## 2024-02-22 ENCOUNTER — Other Ambulatory Visit: Payer: Self-pay

## 2024-02-23 ENCOUNTER — Other Ambulatory Visit (HOSPITAL_COMMUNITY): Payer: Self-pay

## 2024-02-23 ENCOUNTER — Telehealth (HOSPITAL_COMMUNITY): Payer: Self-pay

## 2024-02-23 NOTE — Telephone Encounter (Signed)
 Advanced Heart Failure Patient Advocate Encounter  Prior authorization for Attruby  has been submitted and approved. Test billing returns $4.90 for 28 day supply.  Key: [id number] Effective: 02/23/2024 to 01/26/2025  Rachel DEL, CPhT Rx Patient Advocate Phone: [phone]

## 2024-02-24 ENCOUNTER — Other Ambulatory Visit: Payer: Self-pay

## 2024-02-24 NOTE — Progress Notes (Signed)
 Specialty Pharmacy Refill Coordination Note  [name] is a 74 y.o. female contacted today regarding refills of specialty medication(s) Acoramidis  HCl   Patient requested Delivery   Delivery date: 03/04/24   Verified address: [address] [place]   Medication will be filled on: 03/03/24

## 2024-02-29 ENCOUNTER — Other Ambulatory Visit: Payer: Self-pay

## 2024-03-03 ENCOUNTER — Other Ambulatory Visit: Payer: Self-pay

## 2024-03-30 ENCOUNTER — Ambulatory Visit

## 2024-04-20 ENCOUNTER — Encounter (INDEPENDENT_AMBULATORY_CARE_PROVIDER_SITE_OTHER): Admitting: Ophthalmology

## 2024-06-29 ENCOUNTER — Ambulatory Visit

## 2024-09-28 ENCOUNTER — Ambulatory Visit
